# Patient Record
Sex: Female | Born: 1950
Health system: Southern US, Community
[De-identification: ages and names within clinical notes are randomized; demographics above are authoritative.]

## PROBLEM LIST (undated history)

## (undated) DIAGNOSIS — J449 Chronic obstructive pulmonary disease, unspecified: Secondary | ICD-10-CM

## (undated) DIAGNOSIS — G4733 Obstructive sleep apnea (adult) (pediatric): Secondary | ICD-10-CM

## (undated) DIAGNOSIS — E669 Obesity, unspecified: Secondary | ICD-10-CM

## (undated) DIAGNOSIS — F32A Depression, unspecified: Secondary | ICD-10-CM

## (undated) DIAGNOSIS — M1990 Unspecified osteoarthritis, unspecified site: Secondary | ICD-10-CM

## (undated) DIAGNOSIS — G4700 Insomnia, unspecified: Secondary | ICD-10-CM

## (undated) DIAGNOSIS — F329 Major depressive disorder, single episode, unspecified: Secondary | ICD-10-CM

## (undated) DIAGNOSIS — L309 Dermatitis, unspecified: Secondary | ICD-10-CM

## (undated) DIAGNOSIS — E119 Type 2 diabetes mellitus without complications: Secondary | ICD-10-CM

## (undated) DIAGNOSIS — K219 Gastro-esophageal reflux disease without esophagitis: Secondary | ICD-10-CM

## (undated) DIAGNOSIS — M1711 Unilateral primary osteoarthritis, right knee: Secondary | ICD-10-CM

## (undated) DIAGNOSIS — F419 Anxiety disorder, unspecified: Secondary | ICD-10-CM

## (undated) DIAGNOSIS — R0602 Shortness of breath: Secondary | ICD-10-CM

## (undated) DIAGNOSIS — I10 Essential (primary) hypertension: Secondary | ICD-10-CM

## (undated) DIAGNOSIS — G4730 Sleep apnea, unspecified: Secondary | ICD-10-CM

## (undated) DIAGNOSIS — G629 Polyneuropathy, unspecified: Secondary | ICD-10-CM

## (undated) DIAGNOSIS — J45909 Unspecified asthma, uncomplicated: Secondary | ICD-10-CM

## (undated) DIAGNOSIS — M549 Dorsalgia, unspecified: Secondary | ICD-10-CM

## (undated) DIAGNOSIS — I5032 Chronic diastolic (congestive) heart failure: Secondary | ICD-10-CM

## (undated) DIAGNOSIS — Z8709 Personal history of other diseases of the respiratory system: Secondary | ICD-10-CM

## (undated) DIAGNOSIS — I350 Nonrheumatic aortic (valve) stenosis: Secondary | ICD-10-CM

## (undated) DIAGNOSIS — J302 Other seasonal allergic rhinitis: Secondary | ICD-10-CM

## (undated) DIAGNOSIS — J189 Pneumonia, unspecified organism: Secondary | ICD-10-CM

## (undated) HISTORY — DX: Type 2 diabetes mellitus without complications: E11.9

## (undated) HISTORY — DX: Sleep apnea, unspecified: G47.30

## (undated) HISTORY — DX: Chronic diastolic (congestive) heart failure: I50.32

## (undated) HISTORY — DX: Other seasonal allergic rhinitis: J30.2

## (undated) HISTORY — PX: COLONOSCOPY: SHX174

## (undated) HISTORY — DX: Essential (primary) hypertension: I10

## (undated) HISTORY — PX: BREAST SURGERY: SHX581

## (undated) HISTORY — PX: POLYPECTOMY: SHX149

## (undated) HISTORY — DX: Obstructive sleep apnea (adult) (pediatric): G47.33

## (undated) HISTORY — PX: JOINT REPLACEMENT: SHX530

## (undated) HISTORY — DX: Unilateral primary osteoarthritis, right knee: M17.11

## (undated) HISTORY — DX: Nonrheumatic aortic (valve) stenosis: I35.0

## (undated) HISTORY — DX: Unspecified osteoarthritis, unspecified site: M19.90

## (undated) HISTORY — DX: Shortness of breath: R06.02

## (undated) HISTORY — DX: Chronic obstructive pulmonary disease, unspecified: J44.9

## (undated) HISTORY — PX: KNEE ARTHROSCOPY: SUR90

## (undated) HISTORY — DX: Unspecified asthma, uncomplicated: J45.909

## (undated) HISTORY — DX: Dorsalgia, unspecified: M54.9

## (undated) HISTORY — DX: Obesity, unspecified: E66.9

---

## 2014-01-20 ENCOUNTER — Encounter: Payer: Self-pay | Admitting: Pulmonary Disease

## 2014-01-20 ENCOUNTER — Encounter (INDEPENDENT_AMBULATORY_CARE_PROVIDER_SITE_OTHER): Payer: Self-pay

## 2014-01-20 ENCOUNTER — Ambulatory Visit (INDEPENDENT_AMBULATORY_CARE_PROVIDER_SITE_OTHER)
Admission: RE | Admit: 2014-01-20 | Discharge: 2014-01-20 | Disposition: A | Discharge status: Home / Self Care | Payer: Medicare Other | Source: Ambulatory Visit | Attending: Pulmonary Disease | Admitting: Pulmonary Disease

## 2014-01-20 ENCOUNTER — Ambulatory Visit (INDEPENDENT_AMBULATORY_CARE_PROVIDER_SITE_OTHER): Payer: Medicare Other | Admitting: Pulmonary Disease

## 2014-01-20 VITALS — BP 122/68 | HR 62 | Ht 67.0 in | Wt 328.0 lb

## 2014-01-20 DIAGNOSIS — Z0000 Encounter for general adult medical examination without abnormal findings: Secondary | ICD-10-CM

## 2014-01-20 DIAGNOSIS — R0600 Dyspnea, unspecified: Secondary | ICD-10-CM

## 2014-01-20 DIAGNOSIS — R0683 Snoring: Secondary | ICD-10-CM

## 2014-01-20 MED ORDER — ALBUTEROL SULFATE HFA 108 (90 BASE) MCG/ACT IN AERS: 2.0000 | INHALATION_SPRAY | Freq: Four times a day (QID) | RESPIRATORY_TRACT | Status: DC | PRN

## 2014-01-20 NOTE — Patient Instructions (Addendum)
Symbicort two puffs twice per day and rinse mouth after each use Proair two puffs up to four times per day as needed Singulair 10 mg pill nightly Will schedule home sleep study and breathing test (PFT) Chest xray today Will arrange for referral to primary care for health maintenance Follow up in 6 weeks

## 2014-01-20 NOTE — Progress Notes (Deleted)
   Subjective:    Patient ID: [name], female    DOB: Jun 10, 1950, 63 y.o.   MRN: [id number]  HPI    Review of Systems  Constitutional: Negative for fever and unexpected weight change.  HENT: Positive for congestion, dental problem and sneezing. Negative for ear pain, nosebleeds, postnasal drip, rhinorrhea, sinus pressure, sore throat and trouble swallowing.   Eyes: Negative for redness and itching.  Respiratory: Positive for shortness of breath. Negative for cough, chest tightness and wheezing.   Cardiovascular: Negative for palpitations and leg swelling.  Gastrointestinal: Negative for nausea and vomiting.       Acid Heartburn // Indigestion  Genitourinary: Negative for dysuria.  Musculoskeletal: Negative for joint swelling.  Skin: Negative for rash.  Neurological: Negative for headaches.  Hematological: Does not bruise/bleed easily.  Psychiatric/Behavioral: Negative for dysphoric mood. The patient is nervous/anxious.        Objective:   Physical Exam        Assessment & Plan:

## 2014-01-20 NOTE — Progress Notes (Signed)
Chief Complaint  Patient presents with  . PULMONARY CONSULT    Self Referral- SOB w/exertion.     History of Present Illness: Stacey Page is a 63 y.o. female former smoker for evaluation of dyspnea.  She has hx of asthma.  She was being followed by PCP in [place].  She moved to [facility] in July 2015.  She has noticed trouble with her breathing for years.  She feels short of breath all the time.  She gets wheeze in her chest.  She denies cough, or sputum.  She has hx of allergies. She has been using nasal steroid and this helps.  She still has frequent problems with her sinuses.  She does not recall having allergy testing.  She has pneumonia as a baby, and again in 2012.  She did not need supplemental oxygen after pneumonia episode in 2012.  There is no hx of exposure to TB.  She has been on disability since 1998 >> due to dermatitis and mental health issues.  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  She had pet cats/dogs in [place] >> she thinks she was allergic to the pets.  She denies hx of aspirin allergy.  She does not recall having chest xray or PFT recently.  She is not aware of having heart disease or circulation problems.  She started smoking at age 65, smoked 2 packs in a week, and quit in 1982.  Her daughters and husband smoked much more.  She also has trouble sleeping.  She will snore, and wake up with a choking sensation.  She has been told she stops breathing while asleep.  She feels tired during the day, and gets dry mouth at night.  Her Epworth score is 13 out of 24.  She does not recall every having a sleep study before.   Tests:   Stacey Page  has a past medical history of HTN (hypertension); Asthma; Diabetes; Seasonal allergies; and OA (osteoarthritis).  Stacey Page  has past surgical history that includes Cesarean section (x2) and Polypectomy.  Prior to Admission medications   Not on File    No Known Allergies  Her family  history includes COPD in her father; Cancer in her mother.  She  reports that she quit smoking about 33 years ago. Her smoking use included Cigarettes. She has a 3.75 pack-year smoking history. She does not have any smokeless tobacco history on file. She reports that she drinks alcohol. She reports that she uses illicit drugs (Marijuana).  Review of Systems  Constitutional: Negative for fever and unexpected weight change.  HENT: Positive for congestion, dental problem and sneezing. Negative for ear pain, nosebleeds, postnasal drip, rhinorrhea, sinus pressure, sore throat and trouble swallowing.   Eyes: Negative for redness and itching.  Respiratory: Positive for shortness of breath. Negative for cough, chest tightness and wheezing.   Cardiovascular: Negative for palpitations and leg swelling.  Gastrointestinal: Negative for nausea and vomiting.       Acid Heartburn // Indigestion  Genitourinary: Negative for dysuria.  Musculoskeletal: Negative for joint swelling.  Skin: Negative for rash.  Neurological: Negative for headaches.  Hematological: Does not bruise/bleed easily.  Psychiatric/Behavioral: Negative for dysphoric mood. The patient is nervous/anxious.    Physical Exam:  General - No distress ENT - No sinus tenderness, no oral exudate, no LAN, no thyromegaly, TM clear, pupils equal/reactive Cardiac - s1s2 regular, no murmur, pulses symmetric Chest - No wheeze/rales/dullness, good air entry, normal respiratory excursion Back -  No focal tenderness Abd - Soft, non-tender, no organomegaly, + bowel sounds Ext - No edema Neuro - Normal strength, cranial nerves intact Skin - No rashes Psych - Normal mood, and behavior   Discussion:  She likely has multifactorial dyspnea related to probable obstructive lung disease, obesity with deconditioning, possible cardiac disease and possible peripheral artery disease.  Assessment/Plan:  Possible obstructive lung disease with prior hx of  smoking/second hand tobacco exposure, and reported hx of asthma. Plan: - continue symbicort, singulair, and prn albuterol - will arrange for PFT's to further assess  Probable obstructive sleep apnea. We discussed how sleep apnea can affect various health problems including risks for hypertension, cardiovascular disease, and diabetes.  We also discussed how sleep disruption can increase risks for accident, such as while driving.  Weight loss as a means of improving sleep apnea was also reviewed.  Additional treatment options discussed were CPAP therapy, oral appliance, and surgical intervention. Plan: - will arrange for home sleep study to further assess, pending insurance approval  Dyspnea. Plan: - will further assess with chest xray and PFT - she might need further evaluation by cardiology and assessment for PAD  Health care maintenance. Plan: - will arrange for referral to primary care    Chesley Mires, MD [facility]/Critical Care/Sleep Pager:  [phone]

## 2014-01-23 ENCOUNTER — Telehealth: Payer: Self-pay | Admitting: Pulmonary Disease

## 2014-01-23 DIAGNOSIS — R0600 Dyspnea, unspecified: Secondary | ICD-10-CM

## 2014-01-23 DIAGNOSIS — I517 Cardiomegaly: Secondary | ICD-10-CM

## 2014-01-23 NOTE — Telephone Encounter (Signed)
Dg Chest 2 View  01/20/2014   CLINICAL DATA:  Two-month history of shortness of breath; history of asthma and diabetes and remote history of tobacco use  EXAM: CHEST  2 VIEW  COMPARISON:  None.  FINDINGS: The lungs are adequately inflated. There is no focal infiltrate. The cardiac silhouette is mildly enlarged. The central pulmonary vascularity is prominent. The mediastinum is normal in width. There is no pleural effusion or pneumothorax. There is degenerative disc space narrowing at multiple thoracic levels.  IMPRESSION: Prominence of the cardiac silhouette and pulmonary vascularity suggests low-grade CHF. There is no alveolar pneumonia.   Electronically Signed   By: David  Martinique   On: 01/20/2014 14:27    Results discussed with pt.  Will arrange for echocardiogram to further assess.

## 2014-01-28 ENCOUNTER — Ambulatory Visit (HOSPITAL_COMMUNITY): Payer: Medicare Other | Attending: Cardiology

## 2014-01-28 DIAGNOSIS — Z87891 Personal history of nicotine dependence: Secondary | ICD-10-CM | POA: Diagnosis not present

## 2014-01-28 DIAGNOSIS — R0600 Dyspnea, unspecified: Secondary | ICD-10-CM

## 2014-01-28 DIAGNOSIS — I517 Cardiomegaly: Secondary | ICD-10-CM

## 2014-01-28 NOTE — Progress Notes (Signed)
2D Echo completed. 01/28/2014 

## 2014-02-05 ENCOUNTER — Telehealth: Payer: Self-pay | Admitting: Pulmonary Disease

## 2014-02-05 NOTE — Telephone Encounter (Signed)
Echo 01/28/14 >> severe LVH, EF 65 to [id number], grade 2 diastolic dysfx, mild AS, mild/mod LA.  Results explained to the patient.

## 2014-02-07 DIAGNOSIS — A310 Pulmonary mycobacterial infection: Secondary | ICD-10-CM

## 2014-02-07 HISTORY — DX: Pulmonary mycobacterial infection: A31.0

## 2014-02-12 DIAGNOSIS — G4730 Sleep apnea, unspecified: Secondary | ICD-10-CM

## 2014-02-19 ENCOUNTER — Telehealth: Payer: Self-pay | Admitting: Pulmonary Disease

## 2014-02-19 DIAGNOSIS — I5032 Chronic diastolic (congestive) heart failure: Secondary | ICD-10-CM

## 2014-02-19 DIAGNOSIS — I10 Essential (primary) hypertension: Secondary | ICD-10-CM

## 2014-02-19 DIAGNOSIS — G4733 Obstructive sleep apnea (adult) (pediatric): Secondary | ICD-10-CM

## 2014-02-19 DIAGNOSIS — E118 Type 2 diabetes mellitus with unspecified complications: Secondary | ICD-10-CM

## 2014-02-19 DIAGNOSIS — R0600 Dyspnea, unspecified: Secondary | ICD-10-CM

## 2014-02-19 NOTE — Telephone Encounter (Signed)
Home sleep test was order 01/20/14. Did she has this done or study done else where? LMTCB x1

## 2014-02-19 NOTE — Telephone Encounter (Signed)
Pt is requesting results of the sleep study. VS please advise. thanks

## 2014-02-19 NOTE — Telephone Encounter (Signed)
Can you check where this was done at.  I do not see any sleep study results in our system.

## 2014-02-24 NOTE — Telephone Encounter (Signed)
Pt states she had HST done about 2 weeks ago; Alida was the [facility] to give her the machine. Per Alida results have been printed and are on VS's desk to review and advise. Will forward to VS and Ashtyn.

## 2014-02-25 ENCOUNTER — Encounter: Payer: Self-pay | Admitting: Pulmonary Disease

## 2014-02-25 DIAGNOSIS — E1159 Type 2 diabetes mellitus with other circulatory complications: Secondary | ICD-10-CM | POA: Insufficient documentation

## 2014-02-25 DIAGNOSIS — R0600 Dyspnea, unspecified: Secondary | ICD-10-CM | POA: Insufficient documentation

## 2014-02-25 DIAGNOSIS — I10 Essential (primary) hypertension: Secondary | ICD-10-CM | POA: Insufficient documentation

## 2014-02-25 DIAGNOSIS — R0609 Other forms of dyspnea: Secondary | ICD-10-CM | POA: Insufficient documentation

## 2014-02-25 DIAGNOSIS — I5032 Chronic diastolic (congestive) heart failure: Secondary | ICD-10-CM

## 2014-02-25 DIAGNOSIS — G4730 Sleep apnea, unspecified: Secondary | ICD-10-CM

## 2014-02-25 DIAGNOSIS — E119 Type 2 diabetes mellitus without complications: Secondary | ICD-10-CM | POA: Insufficient documentation

## 2014-02-25 DIAGNOSIS — I110 Hypertensive heart disease with heart failure: Secondary | ICD-10-CM | POA: Insufficient documentation

## 2014-02-25 DIAGNOSIS — G4733 Obstructive sleep apnea (adult) (pediatric): Secondary | ICD-10-CM

## 2014-02-25 HISTORY — DX: Obstructive sleep apnea (adult) (pediatric): G47.33

## 2014-02-25 NOTE — Telephone Encounter (Signed)
HST 02/12/14 >> AHI 59.3, SaO2 low 67%  Will have my nurse inform pt that sleep study shows severe sleep apnea.  She will need to start CPAP therapy.  Please arrange for auto CPAP set up with pressure range 5 to 15 cm H2O with heated humidity and mask of choice.  Will then review test results in more detail at her ROV on 03/12/14.

## 2014-02-26 NOTE — Telephone Encounter (Signed)
Attempted to call pt. Line was busy. Will need to try back later.

## 2014-02-26 NOTE — Telephone Encounter (Signed)
LMTCB x 1 Order placed for CPAP start.  Pt still needs results.

## 2014-02-27 ENCOUNTER — Encounter: Payer: Self-pay | Admitting: Pulmonary Disease

## 2014-02-27 NOTE — Telephone Encounter (Signed)
Spoke with pt and advised of sleep study results per Dr Halford Chessman and informed her that she will be receiving a call from DME within the next week or so.  Pt verbalized understanding and nothing further is needed.

## 2014-03-12 ENCOUNTER — Ambulatory Visit (INDEPENDENT_AMBULATORY_CARE_PROVIDER_SITE_OTHER): Payer: PPO | Admitting: Pulmonary Disease

## 2014-03-12 ENCOUNTER — Other Ambulatory Visit (INDEPENDENT_AMBULATORY_CARE_PROVIDER_SITE_OTHER): Payer: PPO

## 2014-03-12 ENCOUNTER — Encounter: Payer: Self-pay | Admitting: Pulmonary Disease

## 2014-03-12 VITALS — BP 114/68 | HR 61 | Temp 97.0°F | Ht 67.0 in | Wt 343.2 lb

## 2014-03-12 DIAGNOSIS — R0600 Dyspnea, unspecified: Secondary | ICD-10-CM

## 2014-03-12 DIAGNOSIS — R942 Abnormal results of pulmonary function studies: Secondary | ICD-10-CM

## 2014-03-12 DIAGNOSIS — I5032 Chronic diastolic (congestive) heart failure: Secondary | ICD-10-CM

## 2014-03-12 DIAGNOSIS — J4530 Mild persistent asthma, uncomplicated: Secondary | ICD-10-CM

## 2014-03-12 DIAGNOSIS — J984 Other disorders of lung: Secondary | ICD-10-CM

## 2014-03-12 DIAGNOSIS — G4733 Obstructive sleep apnea (adult) (pediatric): Secondary | ICD-10-CM

## 2014-03-12 LAB — BASIC METABOLIC PANEL
BUN: 31 mg/dL — AB (ref 6–23)
CALCIUM: 9.3 mg/dL (ref 8.4–10.5)
CO2: 29 mEq/L (ref 19–32)
Chloride: 106 mEq/L (ref 96–112)
Creatinine, Ser: 1.79 mg/dL — ABNORMAL HIGH (ref 0.40–1.20)
GFR: 36.87 mL/min — AB (ref >60.00)
GLUCOSE: 99 mg/dL (ref 70–99)
POTASSIUM: 4.8 meq/L (ref 3.5–5.1)
Sodium: 138 mEq/L (ref 135–145)

## 2014-03-12 LAB — PULMONARY FUNCTION TEST
DL/VA % PRED: 72 %
DL/VA: 3.74 ml/min/mmHg/L
DLCO UNC % PRED: 52 %
DLCO unc: 14.84 ml/min/mmHg
FEF 25-75 POST: 2.6 L/s
FEF 25-75 Pre: 2.63 L/sec
FEF2575-%Change-Post: -1 %
FEF2575-%PRED-PRE: 119 %
FEF2575-%Pred-Post: 117 %
FEV1-%CHANGE-POST: 0 %
FEV1-%Pred-Post: 88 %
FEV1-%Pred-Pre: 88 %
FEV1-POST: 2.05 L
FEV1-Pre: 2.05 L
FEV1FVC-%Change-Post: 0 %
FEV1FVC-%PRED-PRE: 109 %
FEV6-%Change-Post: 0 %
FEV6-%Pred-Post: 82 %
FEV6-%Pred-Pre: 82 %
FEV6-Post: 2.35 L
FEV6-Pre: 2.37 L
FEV6FVC-%PRED-PRE: 103 %
FEV6FVC-%Pred-Post: 103 %
FVC-%Change-Post: 0 %
FVC-%Pred-Post: 79 %
FVC-%Pred-Pre: 80 %
FVC-PRE: 2.37 L
FVC-Post: 2.35 L
Post FEV1/FVC ratio: 87 %
Post FEV6/FVC ratio: 100 %
Pre FEV1/FVC ratio: 87 %
Pre FEV6/FVC Ratio: 100 %
RV % PRED: 72 %
RV: 1.58 L
TLC % pred: 75 %
TLC: 4.14 L

## 2014-03-12 NOTE — Progress Notes (Signed)
Chief Complaint  Patient presents with  . Follow-up    Review PFT. Pt reports some improvement with breathing since last OV. Reports good tolerance of CPAP     History of Present Illness: Stacey Page is a 64 y.o. female former smoker with dyspnea from asthma, diastolic heart failure, obesity with deconditioning.  She also has sleep apnea.  Since her last visit she had chest xray, echo, home sleep study.  She had PFT today.  CXR showed cardiomegaly and interstitial edema pattern.  Home sleep study showed severe sleep apnea.  Echo showed grade 2 diastolic dysfunction.  PFT today showed mild restriction and moderate diffusion defect.  She has been trying to walk more.  She still gets winded.  She denies chest pain or palpitations.  She has occasional cough >> she does better with her asthma in cool weather.  She feels that symbicort, singulair help.  She was started on CPAP last week.  She feels this is really helping with her sleep.  She has evaluation scheduled with primary care later this month.  TESTS: Echo 01/28/14 >> severe LVH, EF 65 to [id number], grade 2 diastolic dysfx, mild AS, mild/mod LA. HST 02/12/14 >> AHI 59.3, SaO2 low 67% Auto CPAP 03/07/14 to 03/09/14 >> used on 3 of 3 nights with average 8 hrs and 8 min.  Average AHI is 3.3 with median CPAP 12 cm H2O and 95 th percentile CPAP 14 cm H20. PFT 03/12/14 >> FEV1 2.05 (88%), FEV1% 87, TLC 4.14 (75%), DLCO 52%, no BD   Past medical hx >> Diastolic CHF, HTN, DM type II  Past surgical hx, Medications, Allergies, Family hx, Social hx all reviewed.   Physical Exam: Blood pressure 114/68, pulse 61, temperature 97 F (36.1 C), temperature source Oral, height 5\' 7"  (1.702 m), weight 343 lb 3.2 oz (155.674 kg), SpO2 100 %. Body mass index is 53.74 kg/(m^2).  General - No distress ENT - No sinus tenderness, no oral exudate, no LAN Cardiac - s1s2 regular, no murmur Chest - No wheeze/rales/dullness Back - No focal tenderness Abd -  Soft, non-tender Ext - No edema Neuro - Normal strength Skin - No rashes Psych - normal mood, and behavior   Assessment/Plan:  Mild, persistent asthma. Plan: - continue symbicort, singulair, and prn proair  Obstructive sleep apnea. I have reviewed the recent sleep study results with the patient.  We discussed how sleep apnea can affect various health problems including risks for hypertension, cardiovascular disease, and diabetes.  We also discussed how sleep disruption can increase risks for accident, such as while driving.  Weight loss as a means of improving sleep apnea was also reviewed.  Additional treatment options discussed were CPAP therapy, oral appliance, and surgical intervention.  She is compliant with CPAP and reports benefit from therapy. Plan: - continue auto CPAP  Diastolic heart failure. Plan: - continue tx for HTN and OSA - defer further cardiac assessment to primary care  Mild restriction and moderate diffusion defect on PFT. Plan: - will arrange for CT chest with contrast to assess for interstitial lung disease  Obesity with deconditioning. Plan: - continue with diet, exercise, and weight reduction   Chesley Mires, MD [place] [facility]/Critical Care/Sleep Pager:  [phone]

## 2014-03-12 NOTE — Addendum Note (Signed)
Addended by: Virl Cagey on: 03/12/2014 10:53 AM   Modules accepted: Orders

## 2014-03-12 NOTE — Progress Notes (Signed)
PFT done today. 

## 2014-03-12 NOTE — Patient Instructions (Signed)
Will arrange for CT chest and call with results Follow up in 3 months

## 2014-03-14 ENCOUNTER — Telehealth: Payer: Self-pay | Admitting: Pulmonary Disease

## 2014-03-14 NOTE — Telephone Encounter (Signed)
Spoke with Stacy at CT, states creatinine was 1.79, wants to know if VS wants to switch ct on Monday to without contrast of if he wants to continue with a limited dose of contrast.    Sending to doc of day as VS is not available today.    Dr Melvyn Novas please advise.  Thank you!

## 2014-03-14 NOTE — Telephone Encounter (Signed)
Spoke with stacy at CT to change order to hrct wo contrast.    Forwarding to VS as fyi.  Nothing further needed.

## 2014-03-14 NOTE — Telephone Encounter (Signed)
Change to hrct s contrast then

## 2014-03-17 ENCOUNTER — Inpatient Hospital Stay: Admission: RE | Admit: 2014-03-17 | Payer: PPO | Source: Ambulatory Visit

## 2014-03-20 ENCOUNTER — Ambulatory Visit (INDEPENDENT_AMBULATORY_CARE_PROVIDER_SITE_OTHER)
Admission: RE | Admit: 2014-03-20 | Discharge: 2014-03-20 | Disposition: A | Discharge status: Home / Self Care | Payer: PPO | Source: Ambulatory Visit | Attending: Pulmonary Disease | Admitting: Pulmonary Disease

## 2014-03-20 DIAGNOSIS — R942 Abnormal results of pulmonary function studies: Secondary | ICD-10-CM

## 2014-03-20 DIAGNOSIS — J984 Other disorders of lung: Secondary | ICD-10-CM

## 2014-03-20 DIAGNOSIS — R0600 Dyspnea, unspecified: Secondary | ICD-10-CM

## 2014-03-26 ENCOUNTER — Encounter: Payer: Self-pay | Admitting: Pulmonary Disease

## 2014-03-26 ENCOUNTER — Telehealth: Payer: Self-pay | Admitting: Pulmonary Disease

## 2014-03-26 DIAGNOSIS — J849 Interstitial pulmonary disease, unspecified: Secondary | ICD-10-CM | POA: Insufficient documentation

## 2014-03-26 NOTE — Telephone Encounter (Signed)
CT chest 03/20/14 >> borderline LAN, several scattered nodules up to 7 mm, b/l patchy GGO, air-trapping on inspiratory/expiratory images   Discussed results with pt over phone.  Will order lab work with serology.  Depending on results, will then decide if she needs lung tissue sampling with bronchoscopy vs. VATS, or if she could have trial of steroids first.

## 2014-03-27 ENCOUNTER — Other Ambulatory Visit (INDEPENDENT_AMBULATORY_CARE_PROVIDER_SITE_OTHER): Payer: PPO

## 2014-03-27 DIAGNOSIS — J849 Interstitial pulmonary disease, unspecified: Secondary | ICD-10-CM

## 2014-03-27 LAB — CBC WITH DIFFERENTIAL/PLATELET
Basophils Absolute: 0.1 10*3/uL (ref 0.0–0.1)
Basophils Relative: 1.5 % (ref 0.0–3.0)
EOS ABS: 0.1 10*3/uL (ref 0.0–0.7)
Eosinophils Relative: 3.1 % (ref 0.0–5.0)
HEMATOCRIT: 36 % (ref 36.0–46.0)
Hemoglobin: 12 g/dL (ref 12.0–15.0)
LYMPHS PCT: 29.1 % (ref 12.0–46.0)
Lymphs Abs: 1.3 10*3/uL (ref 0.7–4.0)
MCHC: 33.2 g/dL (ref 30.0–36.0)
MCV: 87.1 fl (ref 78.0–100.0)
MONO ABS: 0.3 10*3/uL (ref 0.1–1.0)
Monocytes Relative: 7.2 % (ref 3.0–12.0)
NEUTROS ABS: 2.6 10*3/uL (ref 1.4–7.7)
Neutrophils Relative %: 59.1 % (ref 43.0–77.0)
Platelets: 218 10*3/uL (ref 150.0–400.0)
RBC: 4.13 Mil/uL (ref 3.87–5.11)
RDW: 15.3 % (ref 11.5–15.5)
WBC: 4.4 10*3/uL (ref 4.0–10.5)

## 2014-03-27 LAB — COMPREHENSIVE METABOLIC PANEL
ALBUMIN: 3.9 g/dL (ref 3.5–5.2)
ALK PHOS: 65 U/L (ref 39–117)
ALT: 14 U/L (ref 0–35)
AST: 15 U/L (ref 0–37)
BUN: 26 mg/dL — AB (ref 6–23)
CO2: 27 mEq/L (ref 19–32)
Calcium: 9.3 mg/dL (ref 8.4–10.5)
Chloride: 105 mEq/L (ref 96–112)
Creatinine, Ser: 1.6 mg/dL — ABNORMAL HIGH (ref 0.40–1.20)
GFR: 41.96 mL/min — ABNORMAL LOW (ref >60.00)
GLUCOSE: 99 mg/dL (ref 70–99)
Potassium: 4.1 mEq/L (ref 3.5–5.1)
Sodium: 139 mEq/L (ref 135–145)
Total Bilirubin: 0.4 mg/dL (ref 0.2–1.2)
Total Protein: 7.7 g/dL (ref 6.0–8.3)

## 2014-03-27 LAB — CK: Total CK: 209 U/L — ABNORMAL HIGH (ref 7–177)

## 2014-03-27 LAB — LACTATE DEHYDROGENASE: LDH: 160 U/L (ref 94–250)

## 2014-03-27 LAB — SEDIMENTATION RATE: Sed Rate: 60 mm/hr — ABNORMAL HIGH (ref 0–22)

## 2014-03-28 LAB — ANCA SCREEN W REFLEX TITER
ATYPICAL P-ANCA SCREEN: NEGATIVE
P-ANCA SCREEN: POSITIVE — AB
c-ANCA Screen: NEGATIVE

## 2014-03-28 LAB — ANTI-SCLERODERMA ANTIBODY: Scleroderma (Scl-70) (ENA) Antibody, IgG: <1

## 2014-03-28 LAB — HIV ANTIBODY (ROUTINE TESTING W REFLEX): HIV 1&2 Ab, 4th Generation: NONREACTIVE

## 2014-03-28 LAB — SJOGREN'S SYNDROME ANTIBODS(SSA + SSB)
SSA (RO) (ENA) ANTIBODY, IGG: NEGATIVE
SSB (La) (ENA) Antibody, IgG: <1

## 2014-03-28 LAB — JO-1 ANTIBODY-IGG: Jo-1 Antibody, IgG: <1

## 2014-03-28 LAB — RHEUMATOID FACTOR: Rhuematoid fact SerPl-aCnc: <10 IU/mL (ref <=14)

## 2014-03-28 LAB — ANCA TITERS: P-ANCA: 1:160 {titer} — ABNORMAL HIGH

## 2014-03-31 LAB — ANA W/REFLEX IF POSITIVE: Anti Nuclear Antibody(ANA): NEGATIVE

## 2014-04-01 ENCOUNTER — Encounter: Payer: Self-pay | Admitting: Internal Medicine

## 2014-04-01 ENCOUNTER — Ambulatory Visit (INDEPENDENT_AMBULATORY_CARE_PROVIDER_SITE_OTHER): Payer: PPO | Admitting: Internal Medicine

## 2014-04-01 VITALS — BP 148/82 | HR 67 | Temp 98.1°F | Resp 20 | Ht 68.0 in | Wt 331.0 lb

## 2014-04-01 DIAGNOSIS — E118 Type 2 diabetes mellitus with unspecified complications: Secondary | ICD-10-CM

## 2014-04-01 DIAGNOSIS — Z0000 Encounter for general adult medical examination without abnormal findings: Secondary | ICD-10-CM

## 2014-04-01 DIAGNOSIS — I5032 Chronic diastolic (congestive) heart failure: Secondary | ICD-10-CM

## 2014-04-01 DIAGNOSIS — I10 Essential (primary) hypertension: Secondary | ICD-10-CM

## 2014-04-01 DIAGNOSIS — J849 Interstitial pulmonary disease, unspecified: Secondary | ICD-10-CM

## 2014-04-01 LAB — HYPERSENSITIVITY PNUEMONITIS PROFILE

## 2014-04-01 MED ORDER — LISINOPRIL 40 MG PO TABS: 40.0000 mg | ORAL_TABLET | Freq: Every day | ORAL | Status: DC

## 2014-04-01 MED ORDER — SITAGLIPTIN PHOSPHATE 100 MG PO TABS: 100.0000 mg | ORAL_TABLET | Freq: Every day | ORAL | Status: DC

## 2014-04-01 MED ORDER — LABETALOL HCL 300 MG PO TABS: 300.0000 mg | ORAL_TABLET | Freq: Two times a day (BID) | ORAL | Status: DC

## 2014-04-01 MED ORDER — FUROSEMIDE 40 MG PO TABS: 40.0000 mg | ORAL_TABLET | Freq: Every day | ORAL | Status: DC

## 2014-04-01 MED ORDER — POTASSIUM CITRATE ER 10 MEQ (1080 MG) PO TBCR: 10.0000 meq | EXTENDED_RELEASE_TABLET | Freq: Every day | ORAL | Status: DC

## 2014-04-01 NOTE — Progress Notes (Signed)
Pre visit review using our clinic review tool, if applicable. No additional management support is needed unless otherwise documented below in the visit note. 

## 2014-04-01 NOTE — Patient Instructions (Signed)
We will work on getting you a colon cancer screening to your house. It is called cologuard and has a 96% chance of finding cancer or high grade polyps in the colon. It does have about a 5% false positive. If the test comes back negative we can talk about colon cancer screening in about 3 years. If it is positive we would need to do the colonoscopy as we would be pretty sure you have some kind of polyp in the colon.   Think about getting the pneumonia shot as it can help to decrease your risk of ending up in the hospital with pneumonia. It would be one shot now and another shot in 1 year.   You are also due for a mammogram and a pap smear so please think about doing that. Let us know if you are ready.   We are not checking blood work but will see you back in about 3 months and do blood work then.   Work on losing weight as this will help all of your health problems. There is a weight loss seminar which includes counseling, diet, exercise through [facility]. This is the website: https://west-[facility].com/  The website talks about weight loss surgery but they also help people who do not want to have surgery to lose weight.   Water aerobics is a good way to get exercise as well without the pain in your joints.   I will review your records and see if we think we need to do any other tests to make sure you are healthy.   Serving Sizes What we call a serving size today is larger than it was in the past. A 1950s fast-food burger contained little more than 1 oz of meat, and a soft drink was 8 oz (1 cup). Today, a "quarter pounder" burger is at least 4 times that amount, and a 32 or 64 oz drink is not uncommon. A possible guide for eating when trying to lose weight is to eat about half as much as you normally do. Some estimates of serving sizes are:  1 Dairy serving:Individual container of yogurt (8 oz) or piece of cheese the size of your thumb (1 oz).  1 Grain serving: 1 slice of  bread or  cup pasta.  1 Meat serving: The size of a deck of cards (3 oz).  1 Fruit serving: cup canned fruit or 1 medium fruit.  1 Vegetable serving:  cup of cooked or canned vegetables.  1 Fat serving:The size of 4 stacked dimes. Experts suggest spending 1 or 2 days measuring food portions you commonly eat. This will give you better practice at estimating serving sizes, and will also show whether you are eating an appropriate amount of food to meet your weight goals. If you find that you are eating more than you thought, try measuring your food for a few days so you can "reprogram" yourself to learn what makes a healthy portion for you. SUGGESTIONS FOR CONTROL  In restaurants, share entrees, or ask the waiter to put half the entre in a box or bag before you even touch it.  Order lunch-sized portions. Many restaurants serve 4 to 6 oz of meat at lunch, compared with 8 to 10 oz at dinner.  Split dessert or skip it all together. Have a piece of fruit when you get home.  At home, use smaller plates and bowls. It will look as if you are eating more.  Plate your food in the kitchen rather  than serving it "family style" at the table.  Wait 20 to 30 minutes before taking seconds. This is how long it takes your brain to recognize that you are full.  Check food labels for serving sizes. Eat 1 serving only.  Use measuring cups and spoons to see proper serving sizes.  Buy smaller packages of candy, popcorn, and snacks.  Avoid eating directly out of the bag or carton.  While eating half as much, exercise twice as much. Park further away from the mall, take the stairs instead of the escalator, and walk around your block. Losing weight is a slow, difficult process. It takes long-lasting lifestyle changes. You can make gradual changes over time so they become habits. Look to friends and family to support the healthy changes you are making. Avoid fad diets since they are often only temporary  weight loss solutions. Document Released: 10/23/2002 Document Revised: 04/18/2011 Document Reviewed: 04/23/2013 [employer] Patient Information 2015 [place], [place]. This information is not intended to replace advice given to you by your health care provider. Make sure you discuss any questions you have with your health care provider.

## 2014-04-03 ENCOUNTER — Encounter: Payer: Self-pay | Admitting: Internal Medicine

## 2014-04-03 DIAGNOSIS — Z0000 Encounter for general adult medical examination without abnormal findings: Secondary | ICD-10-CM | POA: Insufficient documentation

## 2014-04-03 DIAGNOSIS — Z6841 Body Mass Index (BMI) 40.0 and over, adult: Secondary | ICD-10-CM | POA: Insufficient documentation

## 2014-04-03 NOTE — Assessment & Plan Note (Signed)
She is on beta blocker, ACE-I and lasix. Have not seen records of the echo but will review.

## 2014-04-03 NOTE — Assessment & Plan Note (Signed)
Decline flu and pneumonia shot today. Declines shingles. Can't remember if she has had colonoscopy and not sure on last mammogram. Will need to review records and talk with her more next time about getting some of her health maintenance taken care of.

## 2014-04-03 NOTE — Assessment & Plan Note (Addendum)
On ACE-I, per her well controlled. Vascular complications noted. Check HgA1c at next visit given high burden of testing to diagnose the ILD, sugars stable on all those blood draws. Declines pneumonia and flu shot today.

## 2014-04-03 NOTE — Assessment & Plan Note (Signed)
BP controlled today, refill of lisinopril, labetalol, furosemide given today. Reviewed recent BMP.

## 2014-04-03 NOTE — Assessment & Plan Note (Signed)
Recent diagnosis per pulmonary and if she requires steroid treatment will need more aggressive treatment of her diabetes.

## 2014-04-03 NOTE — Progress Notes (Signed)
   Subjective:    Patient ID: Stacey Page, female    DOB: 10-13-1950, 64 y.o.   MRN: [id number]  HPI The patient is a 64 YO female who is coming in today for wellness. She does have several chronic conditions which were reviewed and evaluated today (see A/P for status and treatment). She is also recently been evaluated for ILD by pulmonary which is giving her SOB with exertion. No change in those symptoms recently. No other new complaints.   PMH, [facility], social history, allergies, medications, problem list reviewed and updated.   Review of Systems  Constitutional: Positive for activity change. Negative for fever, chills, appetite change, fatigue and unexpected weight change.  HENT: Negative.   Eyes: Negative.   Respiratory: Positive for cough and shortness of breath. Negative for chest tightness and wheezing.   Cardiovascular: Negative for chest pain, palpitations and leg swelling.  Gastrointestinal: Negative for nausea, abdominal pain, diarrhea, constipation and abdominal distention.  Musculoskeletal: Positive for arthralgias.  Skin: Negative.   Neurological: Negative.   Psychiatric/Behavioral: Negative.       Objective:   Physical Exam  Constitutional: She is oriented to person, place, and time. She appears well-developed and well-nourished.  Overewight  HENT:  Head: Normocephalic and atraumatic.  Eyes: EOM are normal.  Cardiovascular: Normal rate.   Pulmonary/Chest: Effort normal. No respiratory distress. She has no wheezes. She has no rales.  Abdominal: Soft. Bowel sounds are normal. She exhibits no distension. There is no tenderness. There is no rebound.  Musculoskeletal: She exhibits no edema.  Neurological: She is alert and oriented to person, place, and time. Coordination normal.  Skin: Skin is warm and dry.  Psychiatric: She has a normal mood and affect.   Filed Vitals:   04/01/14 1016  BP: 148/82  Pulse: 67  Temp: 98.1 F (36.7 C)  TempSrc: Oral  Resp: 20    Height: 5\' 8"  (1.727 m)  Weight: 331 lb (150.141 kg)  SpO2: 90%      Assessment & Plan:

## 2014-04-03 NOTE — Assessment & Plan Note (Signed)
BMI greater than 50 and we did talk briefly today about the fact that her weight is likely causing her OSA, diabetes, hypertension and could be related to her heart failure. She needs to start moving some and we discussed that at length. Also talked to her about the importance of a healthy diet especially given her possible heart failure.

## 2014-04-07 ENCOUNTER — Telehealth: Payer: Self-pay | Admitting: Pulmonary Disease

## 2014-04-07 NOTE — Telephone Encounter (Signed)
Patient says that she went into the web portal and looked at the lab results and she wants to know what happens next.  According to Dr. Unice Cobble notes, patient's lab results determine whether or not patient needs to have bronchoscopy done for Lung Biopsy.  Patient would like to know if the results indicate that she needs to move forward with a biopsy?  Please advise.

## 2014-04-09 MED ORDER — PREDNISONE 20 MG PO TABS: 40.0000 mg | ORAL_TABLET | Freq: Every day | ORAL | Status: DC

## 2014-04-09 NOTE — Telephone Encounter (Signed)
Lab tests d/w pt.    Will start her on prednisone 40 mg daily.    Will have my nurse schedule ROV in 3 weeks with me or Tammy Parrett.    If no improvement with prednisone, then she will need to have evaluation for surgical lung biopsy.  Advised her to d/w her PCP about DM management while on prednisone.

## 2014-04-10 NOTE — Telephone Encounter (Signed)
Spoke with pt and given appt with Tammy Parrett.

## 2014-04-21 ENCOUNTER — Other Ambulatory Visit: Payer: Self-pay | Admitting: Internal Medicine

## 2014-04-21 ENCOUNTER — Telehealth: Payer: Self-pay | Admitting: Internal Medicine

## 2014-04-21 DIAGNOSIS — Z0000 Encounter for general adult medical examination without abnormal findings: Secondary | ICD-10-CM

## 2014-04-21 DIAGNOSIS — Z1231 Encounter for screening mammogram for malignant neoplasm of breast: Secondary | ICD-10-CM

## 2014-04-21 NOTE — Telephone Encounter (Signed)
Patient is requesting a referral for mammogram. Her sister is getting one at [facility] radiology on [address] on 05/06/2014 at 3. She requests to be able to go on this day around this time.

## 2014-04-21 NOTE — Telephone Encounter (Signed)
Would you please put in order?

## 2014-04-21 NOTE — Telephone Encounter (Signed)
Order placed, we cannot control if the imaging center will be able to accommodate her at that time and date.

## 2014-04-21 NOTE — Telephone Encounter (Signed)
Spoke to patient and she has already set up her appointment.

## 2014-05-01 ENCOUNTER — Encounter: Payer: Self-pay | Admitting: Adult Health

## 2014-05-01 ENCOUNTER — Ambulatory Visit (INDEPENDENT_AMBULATORY_CARE_PROVIDER_SITE_OTHER): Payer: PPO | Admitting: Adult Health

## 2014-05-01 VITALS — BP 144/80 | HR 72 | Temp 98.2°F | Ht 68.0 in | Wt 348.0 lb

## 2014-05-01 DIAGNOSIS — J849 Interstitial pulmonary disease, unspecified: Secondary | ICD-10-CM | POA: Diagnosis not present

## 2014-05-01 DIAGNOSIS — G4733 Obstructive sleep apnea (adult) (pediatric): Secondary | ICD-10-CM

## 2014-05-01 MED ORDER — PREDNISONE 20 MG PO TABS: ORAL_TABLET | ORAL | Status: DC

## 2014-05-01 NOTE — Assessment & Plan Note (Signed)
Cont on CPAP  Wt loss

## 2014-05-01 NOTE — Progress Notes (Signed)
   Subjective:    Patient ID: Stacey Page, female    DOB: November 01, 1950, 64 y.o.   MRN: [id number]  HPI Former smoker with dyspnea from asthma, diastolic heart failure, obesity with deconditioning.  She also has sleep apnea on CPAP   TESTS: Echo 01/28/14 >> severe LVH, EF 65 to [id number], grade 2 diastolic dysfx, mild AS, mild/mod LA. HST 02/12/14 >> AHI 59.3, SaO2 low 67% Auto CPAP 03/07/14 to 03/09/14 >> used on 3 of 3 nights with average 8 hrs and 8 min.  Average AHI is 3.3 with median CPAP 12 cm H2O and 95 th percentile CPAP 14 cm H20. PFT 03/12/14 >> FEV1 2.05 (88%), FEV1% 87, TLC 4.14 (75%), DLCO 52%, no BD  05/01/2014 Follow up Dyspnea /Asthma  Pt returns for follow up and to review test.  She underwent at CT chest HR on 2/15 that showed probable ILD, NSIP. Multiple scattered pulmonary nodules , largest of 6 mm .  Labs showed p ANCA positive (1:160 ) , rest of CTD /autoimmune workup neg.  She was started on Prednisone 40mg  daily  Has not noticed a change in her breathing/dyspnea since starting prednisone.  We discussed her test results. Discussed the possiblilty of lung biopsy.  She declines lung bx at this time, want to wait to until next ov to decide .  Denies sign cough. No rash, joint swelling.  No fevers, chest pain, orthopena, increased edema or hemoptysis.   Review of Systems  Constitutional:   No  weight loss, night sweats,  Fevers, chills, + fatigue, or  lassitude.  HEENT:   No headaches,  Difficulty swallowing,  Tooth/dental problems, or  Sore throat,                No sneezing, itching, ear ache, nasal congestion, post nasal drip,   CV:  No chest pain,  Orthopnea, PND, swelling in lower extremities, anasarca, dizziness, palpitations, syncope.   GI  No heartburn, indigestion, abdominal pain, nausea, vomiting, diarrhea, change in bowel habits, loss of appetite, bloody stools.   Resp:   No chest wall deformity  Skin: no rash or lesions.  GU: no dysuria, change in color of  urine, no urgency or frequency.  No flank pain, no hematuria   MS:  No joint pain or swelling.  No decreased range of motion.  No back pain.  Psych:  No change in mood or affect. No depression or anxiety.  No memory loss.          Objective:   Physical Exam GEN: A/Ox3; pleasant , NAD, morbidly obese   HEENT:  [place]/AT,  EACs-clear, TMs-wnl, NOSE-clear, THROAT-clear, no lesions, no postnasal drip or exudate noted.   NECK:  Supple w/ fair ROM; no JVD; normal carotid impulses w/o bruits; no thyromegaly or nodules palpated; no lymphadenopathy.  RESP  Decreased BS in bases , no accessory muscle use, no dullness to percussion  CARD:  RRR, no m/r/g  , no peripheral edema, pulses intact, no cyanosis or clubbing.  GI:   Soft & nt; nml bowel sounds; no organomegaly or masses detected.  Musco: Warm bil, no deformities or joint swelling noted.   Neuro: alert, no focal deficits noted.    Skin: Warm, no lesions or rashes         Assessment & Plan:

## 2014-05-01 NOTE — Patient Instructions (Signed)
Taper Prednisone 30mg  daily for 2 weeks , 20mg  daily for 2 weeks and hold at this dose until seen back in office.  Follow up Dr. Halford Chessman  In 4 weeks and As needed   Please contact office for sooner follow up if symptoms do not improve or worsen or seek emergency care

## 2014-05-01 NOTE — Assessment & Plan Note (Signed)
PFT with no sign airflow obstruction , decrease diffusing capacity  CT showed probable ILD (NSIP )  Lab panel show + p ANCA  Steroid challenge without sign perceived benefit.  Will taper steroids slowly until seen back in office  Consider Rheumatology referral . For possible vasculitis   Plan  Taper Prednisone 30mg  daily for 2 weeks , 20mg  daily for 2 weeks and hold at this dose until seen back in office.  Follow up Dr. Halford Chessman  In 4 weeks and As needed   Please contact office for sooner follow up if symptoms do not improve or worsen or seek emergency care

## 2014-05-02 NOTE — Progress Notes (Signed)
Reviewed.  She has not benefited from trial of steroids.  Agree that she needs evaluation by rheumatology.  She might also needs lung biopsy to further assess, but will get rheumatology evaluation first.

## 2014-05-05 ENCOUNTER — Telehealth: Payer: Self-pay | Admitting: Adult Health

## 2014-05-05 DIAGNOSIS — J849 Interstitial pulmonary disease, unspecified: Secondary | ICD-10-CM

## 2014-05-05 NOTE — Telephone Encounter (Signed)
Spoke with the pt  She states that she had discussed possible bronch with TP at last visit  She wants to go ahead and do this before her next visit on 06/09/14  Please advise thanks

## 2014-05-06 ENCOUNTER — Ambulatory Visit
Admission: RE | Admit: 2014-05-06 | Discharge: 2014-05-06 | Disposition: A | Discharge status: Home / Self Care | Payer: PPO | Source: Ambulatory Visit | Attending: Internal Medicine | Admitting: Internal Medicine

## 2014-05-06 DIAGNOSIS — Z1231 Encounter for screening mammogram for malignant neoplasm of breast: Secondary | ICD-10-CM

## 2014-05-08 NOTE — Telephone Encounter (Signed)
Discussed with pt over phone about current status of ILD.  No improvement with trial of prednisone.  She needs appointment with rheumatology >> already in the works.  Explained that bronchoscopy would be of limited utility at this point, and better option for biopsy is referral to thoracic surgery to assess for VATS biopsy.  I have placed ordered for referral.

## 2014-05-19 ENCOUNTER — Encounter (HOSPITAL_COMMUNITY): Payer: Self-pay

## 2014-05-19 ENCOUNTER — Emergency Department (HOSPITAL_COMMUNITY)
Admission: EM | Admit: 2014-05-19 | Discharge: 2014-05-19 | Disposition: A | Discharge status: Home / Self Care | Payer: PPO | Attending: Emergency Medicine | Admitting: Emergency Medicine

## 2014-05-19 DIAGNOSIS — M1990 Unspecified osteoarthritis, unspecified site: Secondary | ICD-10-CM | POA: Diagnosis not present

## 2014-05-19 DIAGNOSIS — R55 Syncope and collapse: Secondary | ICD-10-CM | POA: Diagnosis present

## 2014-05-19 DIAGNOSIS — E119 Type 2 diabetes mellitus without complications: Secondary | ICD-10-CM | POA: Insufficient documentation

## 2014-05-19 DIAGNOSIS — I10 Essential (primary) hypertension: Secondary | ICD-10-CM | POA: Insufficient documentation

## 2014-05-19 DIAGNOSIS — Z8669 Personal history of other diseases of the nervous system and sense organs: Secondary | ICD-10-CM | POA: Diagnosis not present

## 2014-05-19 DIAGNOSIS — Z7951 Long term (current) use of inhaled steroids: Secondary | ICD-10-CM | POA: Diagnosis not present

## 2014-05-19 DIAGNOSIS — Z79899 Other long term (current) drug therapy: Secondary | ICD-10-CM | POA: Insufficient documentation

## 2014-05-19 DIAGNOSIS — J45909 Unspecified asthma, uncomplicated: Secondary | ICD-10-CM | POA: Diagnosis not present

## 2014-05-19 DIAGNOSIS — Z87891 Personal history of nicotine dependence: Secondary | ICD-10-CM | POA: Diagnosis not present

## 2014-05-19 LAB — BASIC METABOLIC PANEL
Anion gap: 11 (ref 5–15)
BUN: 32 mg/dL — ABNORMAL HIGH (ref 6–23)
CO2: 24 mmol/L (ref 19–32)
Calcium: 9 mg/dL (ref 8.4–10.5)
Chloride: 104 mmol/L (ref 96–112)
Creatinine, Ser: 1.58 mg/dL — ABNORMAL HIGH (ref 0.50–1.10)
GFR calc Af Amer: 39 mL/min — ABNORMAL LOW (ref >90)
GFR calc non Af Amer: 34 mL/min — ABNORMAL LOW (ref >90)
Glucose, Bld: 125 mg/dL — ABNORMAL HIGH (ref 70–99)
Potassium: 3.8 mmol/L (ref 3.5–5.1)
Sodium: 139 mmol/L (ref 135–145)

## 2014-05-19 LAB — CBC
HCT: 36.5 % (ref 36.0–46.0)
Hemoglobin: 11.6 g/dL — ABNORMAL LOW (ref 12.0–15.0)
MCH: 29.4 pg (ref 26.0–34.0)
MCHC: 31.8 g/dL (ref 30.0–36.0)
MCV: 92.6 fL (ref 78.0–100.0)
Platelets: 149 10*3/uL — ABNORMAL LOW (ref 150–400)
RBC: 3.94 MIL/uL (ref 3.87–5.11)
RDW: 15.9 % — ABNORMAL HIGH (ref 11.5–15.5)
WBC: 7.7 10*3/uL (ref 4.0–10.5)

## 2014-05-19 LAB — CBG MONITORING, ED: Glucose-Capillary: 119 mg/dL — ABNORMAL HIGH (ref 70–99)

## 2014-05-19 MED ORDER — SODIUM CHLORIDE 0.9 % IV BOLUS (SEPSIS)
1000.0000 mL | Freq: Once | INTRAVENOUS | Status: AC
  Administered 2014-05-19: 1000 mL via INTRAVENOUS
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
## 2014-05-19 NOTE — ED Notes (Signed)
Pt ambulated to bathroom with stand by assistance. Pt remained steady and denies dizziness.
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
## 2014-05-19 NOTE — Discharge Instructions (Signed)
Do not take your evening dose of labetalol (normodyne) today. Resuming taking it tomorrow as prescribed.    Syncope Syncope is a medical term for fainting or passing out. This means you lose consciousness and drop to the ground. People are generally unconscious for less than 5 minutes. You may have some muscle twitches for up to 15 seconds before waking up and returning to normal. Syncope occurs more often in older adults, but it can happen to anyone. While most causes of syncope are not dangerous, syncope can be a sign of a serious medical problem. It is important to seek medical care.  CAUSES  Syncope is caused by a sudden drop in blood flow to the brain. The specific cause is often not determined. Factors that can bring on syncope include:  Taking medicines that lower blood pressure.  Sudden changes in posture, such as standing up quickly.  Taking more medicine than prescribed.  Standing in one place for too long.  Seizure disorders.  Dehydration and excessive exposure to heat.  Low blood sugar (hypoglycemia).  Straining to have a bowel movement.  Heart disease, irregular heartbeat, or other circulatory problems.  Fear, emotional distress, seeing blood, or severe pain. SYMPTOMS  Right before fainting, you may:  Feel dizzy or light-headed.  Feel nauseous.  See all white or all black in your field of vision.  Have cold, clammy skin. DIAGNOSIS  Your health care provider will ask about your symptoms, perform a physical exam, and perform an electrocardiogram (ECG) to record the electrical activity of your heart. Your health care provider may also perform other heart or blood tests to determine the cause of your syncope which may include:  Transthoracic echocardiogram (TTE). During echocardiography, sound waves are used to evaluate how blood flows through your heart.  Transesophageal echocardiogram (TEE).  Cardiac monitoring. This allows your health care provider to monitor  your heart rate and rhythm in real time.  Holter monitor. This is a portable device that records your heartbeat and can help diagnose heart arrhythmias. It allows your health care provider to track your heart activity for several days, if needed.  Stress tests by exercise or by giving medicine that makes the heart beat faster. TREATMENT  In most cases, no treatment is needed. Depending on the cause of your syncope, your health care provider may recommend changing or stopping some of your medicines. HOME CARE INSTRUCTIONS  Have someone stay with you until you feel stable.  Do not drive, use machinery, or play sports until your health care provider says it is okay.  Keep all follow-up appointments as directed by your health care provider.  Lie down right away if you start feeling like you might faint. Breathe deeply and steadily. Wait until all the symptoms have passed.  Drink enough fluids to keep your urine clear or pale yellow.  If you are taking blood pressure or heart medicine, get up slowly and take several minutes to sit and then stand. This can reduce dizziness. SEEK IMMEDIATE MEDICAL CARE IF:   You have a severe headache.  You have unusual pain in the chest, abdomen, or back.  You are bleeding from your mouth or rectum, or you have black or tarry stool.  You have an irregular or very fast heartbeat.  You have pain with breathing.  You have repeated fainting or seizure-like jerking during an episode.  You faint when sitting or lying down.  You have confusion.  You have trouble walking.  You have severe weakness.  You have vision problems. If you fainted, call your local emergency services (911 in [place] Do not drive yourself to the hospital.  MAKE SURE YOU:  Understand these instructions.  Will watch your condition.  Will get help right away if you are not doing well or get worse. Document Released: 01/24/2005 Document Revised: 01/29/2013 Document Reviewed:  03/25/2011 [facility] Patient Information 2015 [place], [place]. This information is not intended to replace advice given to you by your health care provider. Make sure you discuss any questions you have with your health care provider.

## 2014-05-20 ENCOUNTER — Other Ambulatory Visit (INDEPENDENT_AMBULATORY_CARE_PROVIDER_SITE_OTHER): Payer: PPO

## 2014-05-20 ENCOUNTER — Ambulatory Visit (INDEPENDENT_AMBULATORY_CARE_PROVIDER_SITE_OTHER): Payer: PPO | Admitting: Internal Medicine

## 2014-05-20 ENCOUNTER — Encounter: Payer: Self-pay | Admitting: Internal Medicine

## 2014-05-20 ENCOUNTER — Other Ambulatory Visit: Payer: Self-pay | Admitting: Geriatric Medicine

## 2014-05-20 VITALS — BP 106/62 | HR 73 | Temp 98.6°F | Resp 16 | Ht 68.0 in | Wt 343.0 lb

## 2014-05-20 DIAGNOSIS — E0821 Diabetes mellitus due to underlying condition with diabetic nephropathy: Secondary | ICD-10-CM | POA: Diagnosis not present

## 2014-05-20 DIAGNOSIS — I10 Essential (primary) hypertension: Secondary | ICD-10-CM

## 2014-05-20 DIAGNOSIS — E0929 Drug or chemical induced diabetes mellitus with other diabetic kidney complication: Secondary | ICD-10-CM

## 2014-05-20 LAB — LIPID PANEL
CHOL/HDL RATIO: 3
Cholesterol: 214 mg/dL — ABNORMAL HIGH (ref 0–200)
HDL: 71.9 mg/dL (ref >39.00)
LDL Cholesterol: 122 mg/dL — ABNORMAL HIGH (ref 0–99)
NonHDL: 142.1
TRIGLYCERIDES: 101 mg/dL (ref 0.0–149.0)
VLDL: 20.2 mg/dL (ref 0.0–40.0)

## 2014-05-20 LAB — HEMOGLOBIN A1C: HEMOGLOBIN A1C: 6.8 % — AB (ref 4.6–6.5)

## 2014-05-20 LAB — BASIC METABOLIC PANEL
BUN: 31 mg/dL — ABNORMAL HIGH (ref 6–23)
CALCIUM: 9.9 mg/dL (ref 8.4–10.5)
CO2: 29 meq/L (ref 19–32)
CREATININE: 1.55 mg/dL — AB (ref 0.40–1.20)
Chloride: 101 mEq/L (ref 96–112)
GFR: 43.5 mL/min — AB (ref >60.00)
Glucose, Bld: 119 mg/dL — ABNORMAL HIGH (ref 70–99)
Potassium: 4.8 mEq/L (ref 3.5–5.1)
Sodium: 136 mEq/L (ref 135–145)

## 2014-05-20 LAB — TSH: TSH: 0.35 u[IU]/mL (ref 0.35–4.50)

## 2014-05-20 LAB — T3, FREE: T3, Free: 2.6 pg/mL (ref 2.3–4.2)

## 2014-05-20 LAB — T4: T4, Total: 6 ug/dL (ref 4.5–12.0)

## 2014-05-20 MED ORDER — LISINOPRIL 40 MG PO TABS: 40.0000 mg | ORAL_TABLET | Freq: Every day | ORAL | Status: DC

## 2014-05-20 MED ORDER — LABETALOL HCL 200 MG PO TABS: 200.0000 mg | ORAL_TABLET | Freq: Two times a day (BID) | ORAL | Status: DC

## 2014-05-20 MED ORDER — SITAGLIPTIN PHOSPHATE 100 MG PO TABS: 100.0000 mg | ORAL_TABLET | Freq: Every day | ORAL | Status: DC

## 2014-05-20 MED ORDER — POTASSIUM CITRATE ER 10 MEQ (1080 MG) PO TBCR: 10.0000 meq | EXTENDED_RELEASE_TABLET | Freq: Every day | ORAL | Status: DC
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
## 2014-05-20 NOTE — Patient Instructions (Signed)
Syncope °Syncope is a medical term for fainting or passing out. This means you lose consciousness and drop to the ground. People are generally unconscious for less than 5 minutes. You may have some muscle twitches for up to 15 seconds before waking up and returning to normal. Syncope occurs more often in older adults, but it can happen to anyone. While most causes of syncope are not dangerous, syncope can be a sign of a serious medical problem. It is important to seek medical care.  °CAUSES  °Syncope is caused by a sudden drop in blood flow to the brain. The specific cause is often not determined. Factors that can bring on syncope include: °· Taking medicines that lower blood pressure. °· Sudden changes in posture, such as standing up quickly. °· Taking more medicine than prescribed. °· Standing in one place for too long. °· Seizure disorders. °· Dehydration and excessive exposure to heat. °· Low blood sugar (hypoglycemia). °· Straining to have a bowel movement. °· Heart disease, irregular heartbeat, or other circulatory problems. °· Fear, emotional distress, seeing blood, or severe pain. °SYMPTOMS  °Right before fainting, you may: °· Feel dizzy or light-headed. °· Feel nauseous. °· See all white or all black in your field of vision. °· Have cold, clammy skin. °DIAGNOSIS  °Your health care provider will ask about your symptoms, perform a physical exam, and perform an electrocardiogram (ECG) to record the electrical activity of your heart. Your health care provider may also perform other heart or blood tests to determine the cause of your syncope which may include: °· Transthoracic echocardiogram (TTE). During echocardiography, sound waves are used to evaluate how blood flows through your heart. °· Transesophageal echocardiogram (TEE). °· Cardiac monitoring. This allows your health care provider to monitor your heart rate and rhythm in real time. °· Holter monitor. This is a portable device that records your  heartbeat and can help diagnose heart arrhythmias. It allows your health care provider to track your heart activity for several days, if needed. °· Stress tests by exercise or by giving medicine that makes the heart beat faster. °TREATMENT  °In most cases, no treatment is needed. Depending on the cause of your syncope, your health care provider may recommend changing or stopping some of your medicines. °HOME CARE INSTRUCTIONS °· Have someone stay with you until you feel stable. °· Do not drive, use machinery, or play sports until your health care provider says it is okay. °· Keep all follow-up appointments as directed by your health care provider. °· Lie down right away if you start feeling like you might faint. Breathe deeply and steadily. Wait until all the symptoms have passed. °· Drink enough fluids to keep your urine clear or pale yellow. °· If you are taking blood pressure or heart medicine, get up slowly and take several minutes to sit and then stand. This can reduce dizziness. °SEEK IMMEDIATE MEDICAL CARE IF:  °· You have a severe headache. °· You have unusual pain in the chest, abdomen, or back. °· You are bleeding from your mouth or rectum, or you have black or tarry stool. °· You have an irregular or very fast heartbeat. °· You have pain with breathing. °· You have repeated fainting or seizure-like jerking during an episode. °· You faint when sitting or lying down. °· You have confusion. °· You have trouble walking. °· You have severe weakness. °· You have vision problems. °If you fainted, call your local emergency services (911 in [place] Do not drive   yourself to the hospital.  °MAKE SURE YOU: °· Understand these instructions. °· Will watch your condition. °· Will get help right away if you are not doing well or get worse. °Document Released: 01/24/2005 Document Revised: 01/29/2013 Document Reviewed: 03/25/2011 °[employer]® Patient Information ©2015 [employer]. This information is not intended to replace  advice given to you by your health care provider. Make sure you discuss any questions you have with your health care provider. ° °

## 2014-05-20 NOTE — Assessment & Plan Note (Addendum)
I think her syncopal episode was related to low BP and low heart rate Will decrease the normodyne from 300 mg BID to 200 mg BID She will cont the other meds for HTN Will recheck her lytes and renal function today Will recheck her BP and heart rate in 2-3 weeks

## 2014-05-20 NOTE — Progress Notes (Signed)
Pre visit review using our clinic review tool, if applicable. No additional management support is needed unless otherwise documented below in the visit note. 

## 2014-05-21 ENCOUNTER — Encounter: Payer: PPO | Admitting: Thoracic Surgery (Cardiothoracic Vascular Surgery)

## 2014-05-21 ENCOUNTER — Encounter: Payer: Self-pay | Admitting: Internal Medicine

## 2014-05-21 NOTE — Progress Notes (Signed)
This encounter was created in error - please disregard.  This encounter was created in error - please disregard.

## 2014-05-26 ENCOUNTER — Other Ambulatory Visit: Payer: Self-pay | Admitting: Geriatric Medicine

## 2014-05-26 ENCOUNTER — Telehealth: Payer: Self-pay | Admitting: Internal Medicine

## 2014-05-26 DIAGNOSIS — I10 Essential (primary) hypertension: Secondary | ICD-10-CM

## 2014-05-26 MED ORDER — LABETALOL HCL 200 MG PO TABS: 200.0000 mg | ORAL_TABLET | Freq: Two times a day (BID) | ORAL | Status: DC

## 2014-05-26 MED ORDER — LISINOPRIL 40 MG PO TABS: 40.0000 mg | ORAL_TABLET | Freq: Every day | ORAL | Status: DC

## 2014-05-26 MED ORDER — POTASSIUM CITRATE ER 10 MEQ (1080 MG) PO TBCR: 10.0000 meq | EXTENDED_RELEASE_TABLET | Freq: Every day | ORAL | Status: DC

## 2014-05-26 MED ORDER — SITAGLIPTIN PHOSPHATE 100 MG PO TABS: 100.0000 mg | ORAL_TABLET | Freq: Every day | ORAL | Status: DC

## 2014-05-26 NOTE — Telephone Encounter (Signed)
Scripts were sent to [facility] again in error. Please send to [facility] on [address] as listed on chart. Thank you.   There are also several questions that she has regarding lab results from [facility] visit. She read on mychart, but has several questions regarding. Please call her.

## 2014-05-26 NOTE — Telephone Encounter (Signed)
Patient is xferred pharmacies, and requests that [facility] [address]. Changed in her preferences as well.   labetalol (NORMODYNE) 200 MG tablet [id number]  lisinopril (PRINIVIL,ZESTRIL) 40 MG tablet [id number]  potassium citrate (UROCIT-K) 10 MEQ (1080 MG) SR tablet [id number]  sitaGLIPtin (JANUVIA) 100 MG tablet [id number]

## 2014-05-26 NOTE — Telephone Encounter (Signed)
Sent to pharmacy 

## 2014-05-27 ENCOUNTER — Other Ambulatory Visit: Payer: Self-pay | Admitting: Geriatric Medicine

## 2014-05-27 MED ORDER — LISINOPRIL 40 MG PO TABS: 40.0000 mg | ORAL_TABLET | Freq: Every day | ORAL | Status: DC

## 2014-05-27 NOTE — Telephone Encounter (Signed)
Sent to pharmacy 

## 2014-05-27 NOTE — Telephone Encounter (Signed)
Pt called and wants lisinopril (PRINIVIL,ZESTRIL) 40 MG tablet [id number] [facility].    [place]

## 2014-05-29 NOTE — Telephone Encounter (Signed)
Please call patients daughter Stacey Page. She has several questions regarding lab results from [place] visit. She read on MyChart, but has several questions. Please call her today.

## 2014-05-30 ENCOUNTER — Institutional Professional Consult (permissible substitution) (INDEPENDENT_AMBULATORY_CARE_PROVIDER_SITE_OTHER): Payer: PPO | Admitting: Cardiothoracic Surgery

## 2014-05-30 ENCOUNTER — Encounter: Payer: Self-pay | Admitting: Cardiothoracic Surgery

## 2014-05-30 VITALS — BP 138/73 | HR 66 | Resp 20 | Ht 68.0 in | Wt 340.0 lb

## 2014-05-30 DIAGNOSIS — J849 Interstitial pulmonary disease, unspecified: Secondary | ICD-10-CM

## 2014-05-30 DIAGNOSIS — R918 Other nonspecific abnormal finding of lung field: Secondary | ICD-10-CM | POA: Diagnosis not present

## 2014-05-30 NOTE — Telephone Encounter (Signed)
Patient advised and explained all lab values, we are also checking to see if bp cuff can be bought/paid for by [employer], patient's insurance, in order for daughter, Cassell Smiles to take bp readings at home---patient mostly home bound and recent syncope episode that led patient to ER, and also recent bp med dosage decreased because bp was staying too low----we are waiting for [employer]alt[employer] adv to call back to talk with susan/wellness coach or tamara/Team lead with info about how to get bp cuff for this patient to use at home

## 2014-05-30 NOTE — Telephone Encounter (Signed)
Talked with daughter Cassell Smiles, she is needing a bp cuff for her mother to take bp readings at home---recent bp med dosage change and recent syncope episode that led patient to ER---daughter is wanting to know if healthteam adv will cover cost of this bp cuff-----susan/wellness RN has placed call to [employer] and requested coverage for bp cuff or alternative home monitoring, patient is basically home bound except for doctors visits, etc.---we called [employer] at provider line [phone] and they are supposed to be calling us back with answer, we are to call Cassell Smiles, daughter of patient with findings

## 2014-05-30 NOTE — Telephone Encounter (Signed)
Daughter was calling in again today, Dr Doug Sou or amy is not in office.  It there someone that can call her?     Best number [phone]

## 2014-06-02 ENCOUNTER — Other Ambulatory Visit: Payer: Self-pay | Admitting: *Deleted

## 2014-06-02 DIAGNOSIS — J8410 Pulmonary fibrosis, unspecified: Secondary | ICD-10-CM

## 2014-06-02 NOTE — Progress Notes (Signed)
PCP is Olga Millers, MD Referring Provider is Chesley Mires, MD  Chief Complaint  Patient presents with  . Interstitial Lung Disease    Surgical eval for possible Lung biopsy. Chest CT 03/20/14    HPI: Patient presents with recent diagnosis of interstitial lung disease by high definition CT scan associated with clinical symptoms of progressive shortness of breath and decreasing exercise tolerance. The patient was placed on prednisone empirically by her pulmonologist with some improvement in her symptoms. Before long-term prednisone is recommended the patient's pulmonologist wishes a lung biopsy to document the type of interstitial lung disease. The patient is a nonsmoker and is had no previous chest surgery. She is morbidly obese with diabetes and difficulty performing daily activities. The patient has had echocardiogram which was normal, PFTs which are adequate for VATS lung biopsy, with DLCO reduced at 52%.   Past Medical History  Diagnosis Date  . HTN (hypertension)   . Asthma   . Diabetes   . Seasonal allergies   . OA (osteoarthritis)   . OSA (obstructive sleep apnea) 02/25/2014  . Interstitial lung disease 03/26/2014    Past Surgical History  Procedure Laterality Date  . Cesarean section  x2  . Polypectomy      throat    Family History  Problem Relation Age of Onset  . COPD Father   . Cancer Mother     Social History History  Substance Use Topics  . Smoking status: Former Smoker -- 0.25 packs/day for 15 years    Types: Cigarettes    Quit date: 02/08/1980  . Smokeless tobacco: Not on file     Comment: 2 packs per week  . Alcohol Use: 0.0 oz/week    0 Standard drinks or equivalent per week     Comment: occassional/social/rare    Current Outpatient Prescriptions  Medication Sig Dispense Refill  . albuterol (PROAIR HFA) 108 (90 BASE) MCG/ACT inhaler Inhale 2 puffs into the lungs every 6 (six) hours as needed for wheezing or shortness of breath. 1 Inhaler 3  .  budesonide-formoterol (SYMBICORT) 160-4.5 MCG/ACT inhaler Inhale 2 puffs into the lungs 2 (two) times daily.    . Clobetasol Prop Emollient Base (CLOBETASOL PROPIONATE E) 0.05 % emollient cream Apply 1 application topically daily as needed (skin irritations).     . fluticasone (FLONASE) 50 MCG/ACT nasal spray Place 1 spray into both nostrils daily.    . furosemide (LASIX) 40 MG tablet Take 1 tablet (40 mg total) by mouth daily. 90 tablet 1  . labetalol (NORMODYNE) 200 MG tablet Take 1 tablet (200 mg total) by mouth 2 (two) times daily. 180 tablet 3  . lisinopril (PRINIVIL,ZESTRIL) 40 MG tablet Take 1 tablet (40 mg total) by mouth daily. 90 tablet 3  . montelukast (SINGULAIR) 10 MG tablet Take 10 mg by mouth at bedtime.    . potassium citrate (UROCIT-K) 10 MEQ (1080 MG) SR tablet Take 1 tablet (10 mEq total) by mouth daily. 90 tablet 3  . predniSONE (DELTASONE) 20 MG tablet Taper down to 20mg  and hold at this dose until next office visit 30 tablet 1  . sitaGLIPtin (JANUVIA) 100 MG tablet Take 1 tablet (100 mg total) by mouth daily. 30 tablet 3   No current facility-administered medications for this visit.    No Known Allergies  Review of Systems  Gen.-weight gain on prednisone, no fever HEENT-no change in vision, no active dental complaints Thorax-shortness of breath with exertion, dry cough Cardiac-normal 2-D echocardiogram, no history of  angina GI-no history of hepatitis jaundice blood per rectum Endocrine-positive for diabetes not well controlled Extremities-moderate arthritis, positive for ankle edema  Neurologic-no history stroke or seizure Hematologic-no bleeding disorders  BP 138/73 mmHg  Pulse 66  Resp 20  Ht 5\' 8"  (1.727 m)  Wt 340 lb (154.223 kg)  BMI 51.71 kg/m2  SpO2 92% Physical Exam  General: Morbid obese AA female short of breath with minimal exertion or ambulation   HEENT: Normocephalic pupils equal , dentition adequate Neck: Supple without JVD, adenopathy, or  bruit Chest: Clear to auscultation, symmetrical breath sounds, no rhonchi, no tenderness             or deformity Cardiovascular: Regular rate and rhythm, no murmur, no gallop, peripheral pulses             palpable in all extremities Abdomen:  Soft, nontender, no palpable mass or organomegaly Extremities: Warm, well-perfused, no clubbing cyanosis edema or tenderness,              no venous stasis changes of the legs Rectal/GU: Deferred Neuro: Grossly non--focal and symmetrical throughout Skin: Clean and dry without rash or ulceration   Diagnostic Tests: High-definition CT scan showing interstitial lung disease in the upper lobes right greater than left. No significant adenopathy or discrete pulmonary masses.  Impression: Patient with symptomatic undiagnosed lung disease felt to be interstitial point fibrosis and prednisone has been started. I feel it is appropriate to proceed with VATS lung biopsy, although at increased risk due to her morbid obesity, because the biopsy will help direct long-term therapy and confirm the need for long-term steroids in this already obese diabetic female.  Plan:VATS lung biopsy scheduled for mid May 2016. Patient understands this is at increased risk for postoperative pulmonary complications and increased risk for bleeding due to her morbid obesity obesity.   Len Childs, MD [facility] [phone]

## 2014-06-03 LAB — COLOGUARD: COLOGUARD: NEGATIVE

## 2014-06-06 ENCOUNTER — Telehealth: Payer: Self-pay | Admitting: Internal Medicine

## 2014-06-06 ENCOUNTER — Ambulatory Visit (HOSPITAL_COMMUNITY)
Admission: RE | Admit: 2014-06-06 | Discharge: 2014-06-06 | Disposition: A | Discharge status: Home / Self Care | Payer: PPO | Source: Ambulatory Visit | Attending: Cardiothoracic Surgery | Admitting: Cardiothoracic Surgery

## 2014-06-06 ENCOUNTER — Encounter (HOSPITAL_COMMUNITY): Payer: Self-pay

## 2014-06-06 ENCOUNTER — Encounter (HOSPITAL_COMMUNITY)
Admission: RE | Admit: 2014-06-06 | Discharge: 2014-06-06 | Disposition: A | Discharge status: Home / Self Care | Payer: PPO | Source: Ambulatory Visit | Attending: Cardiothoracic Surgery | Admitting: Cardiothoracic Surgery

## 2014-06-06 VITALS — BP 129/92 | HR 98 | Temp 98.1°F | Resp 20 | Ht 68.0 in | Wt 345.2 lb

## 2014-06-06 DIAGNOSIS — J8410 Pulmonary fibrosis, unspecified: Secondary | ICD-10-CM | POA: Diagnosis not present

## 2014-06-06 DIAGNOSIS — Z01818 Encounter for other preprocedural examination: Secondary | ICD-10-CM | POA: Diagnosis present

## 2014-06-06 HISTORY — DX: Depression, unspecified: F32.A

## 2014-06-06 HISTORY — DX: Gastro-esophageal reflux disease without esophagitis: K21.9

## 2014-06-06 HISTORY — DX: Anxiety disorder, unspecified: F41.9

## 2014-06-06 HISTORY — DX: Major depressive disorder, single episode, unspecified: F32.9

## 2014-06-06 LAB — URINALYSIS, ROUTINE W REFLEX MICROSCOPIC
Bilirubin Urine: NEGATIVE
Glucose, UA: NEGATIVE mg/dL
Hgb urine dipstick: NEGATIVE
Ketones, ur: NEGATIVE mg/dL
Leukocytes, UA: NEGATIVE
Nitrite: NEGATIVE
Protein, ur: NEGATIVE mg/dL
Specific Gravity, Urine: 1.008 (ref 1.005–1.030)
Urobilinogen, UA: 0.2 mg/dL (ref 0.0–1.0)
pH: 5 (ref 5.0–8.0)

## 2014-06-06 LAB — COMPREHENSIVE METABOLIC PANEL
ALT: 22 U/L (ref 0–35)
AST: 15 U/L (ref 0–37)
Albumin: 3.7 g/dL (ref 3.5–5.2)
Alkaline Phosphatase: 54 U/L (ref 39–117)
Anion gap: 12 (ref 5–15)
BUN: 38 mg/dL — ABNORMAL HIGH (ref 6–23)
CO2: 23 mmol/L (ref 19–32)
Calcium: 9.8 mg/dL (ref 8.4–10.5)
Chloride: 103 mmol/L (ref 96–112)
Creatinine, Ser: 1.98 mg/dL — ABNORMAL HIGH (ref 0.50–1.10)
GFR calc Af Amer: 30 mL/min — ABNORMAL LOW (ref >90)
GFR calc non Af Amer: 26 mL/min — ABNORMAL LOW (ref >90)
Glucose, Bld: 116 mg/dL — ABNORMAL HIGH (ref 70–99)
Potassium: 4.2 mmol/L (ref 3.5–5.1)
Sodium: 138 mmol/L (ref 135–145)
Total Bilirubin: 0.6 mg/dL (ref 0.3–1.2)
Total Protein: 7.3 g/dL (ref 6.0–8.3)

## 2014-06-06 LAB — BLOOD GAS, ARTERIAL
Acid-Base Excess: 0.2 mmol/L (ref 0.0–2.0)
Bicarbonate: 24 mEq/L (ref 20.0–24.0)
Drawn by: 421801
FIO2: 0.21 %
O2 Saturation: 96.2 %
Patient temperature: 98.6
TCO2: 25.1 mmol/L (ref 0–100)
pCO2 arterial: 36.5 mmHg (ref 35.0–45.0)
pH, Arterial: 7.433 (ref 7.350–7.450)
pO2, Arterial: 87.2 mmHg (ref 80.0–100.0)

## 2014-06-06 LAB — CBC
HCT: 34.6 % — ABNORMAL LOW (ref 36.0–46.0)
Hemoglobin: 11.1 g/dL — ABNORMAL LOW (ref 12.0–15.0)
MCH: 29.1 pg (ref 26.0–34.0)
MCHC: 32.1 g/dL (ref 30.0–36.0)
MCV: 90.8 fL (ref 78.0–100.0)
Platelets: 233 10*3/uL (ref 150–400)
RBC: 3.81 MIL/uL — ABNORMAL LOW (ref 3.87–5.11)
RDW: 16 % — ABNORMAL HIGH (ref 11.5–15.5)
WBC: 8.3 10*3/uL (ref 4.0–10.5)

## 2014-06-06 LAB — APTT: aPTT: 30 seconds (ref 24–37)

## 2014-06-06 LAB — SURGICAL PCR SCREEN
MRSA, PCR: NEGATIVE
Staphylococcus aureus: NEGATIVE

## 2014-06-06 LAB — PROTIME-INR
INR: 1.11 (ref 0.00–1.49)
Prothrombin Time: 14.5 seconds (ref 11.6–15.2)

## 2014-06-06 LAB — ABO/RH: ABO/RH(D): O POS

## 2014-06-06 LAB — TYPE AND SCREEN
ABO/RH(D): O POS
Antibody Screen: NEGATIVE

## 2014-06-06 NOTE — Telephone Encounter (Signed)
Patient states she is having surgery on Tuesday.  States she was prescribed prednisone.  States she only has two tablets left and wants to know if she is to get refilled.  [facility] [address]

## 2014-06-06 NOTE — Progress Notes (Signed)
PCP is Vertell Novak. Patient denied having any acute cardiac or pulmonary issues. Patient did inform Nurse that she has noticed some tongue swelling that has been occuring only at night over the past few weeks after eating. Patients daughter informed Nurse that patients tongue started to swelling while they were eating at [employer]. Patients daughter had a picture of patients tongue on her cell phone. Patient denied having any difficult swallowing or chewing. Patient denied having any tongue swelling today. Patient also informed Nurse that she has also been having some stiffness in both her hands. Patient stated "sometimes when I'm eating I drop the utensils I'm using." Patients daughter stated it is better if she uses plastic utensil while eating. Patient stated she informed Dr. Prescott Gum of this on her LOV. Will inform [place], [place] of this.  Patients daughter at chair side during PAT visit.   Patient informed Nurse that she last used Albuterol inhaler yesterday.   Patient informed Nurse that she does not check her blood glucose at home.

## 2014-06-06 NOTE — Pre-Procedure Instructions (Signed)
Stacey Page  06/06/2014   Your procedure is scheduled on:  Tuesday Jun 10, 2014 at 7:30 AM.  Report to [facility] Admitting at 5:30 AM.  Call this number if you have problems the morning of surgery: [phone]   Remember:   Do not eat food or drink liquids after midnight.   Take these medicines the morning of surgery with A SIP OF WATER: Albuterol inhaler if needed, Symbicort inhaler, Flonase nasal spray, Labetalol (Normodyne)   Do NOT take any diabetic medications the morning of your surgery (ex. Januvia)   Please stop taking any vitamins, herbal medications, Advil, Ibuprofen, Motrin, Alleve, etc   Do not wear jewelry, make-up or nail polish.  Do not wear lotions, powders, or perfumes. You may NOT wear deodorant.  Do not shave 48 hours prior to surgery.   Do not bring valuables to the hospital.  [facility] is not responsible for any belongings or valuables.               Contacts, dentures or bridgework may not be worn into surgery.  Leave suitcase in the car. After surgery it may be brought to your room.  For patients admitted to the hospital, discharge time is determined by your treatment team.               Patients discharged the day of surgery will not be allowed to drive home.  Name and phone number of your driver:   Special Instructions: Shower using CHG soap the night before and the morning of your surgery   Please read over the following fact sheets that you were given: Pain Booklet, Coughing and Deep Breathing, Blood Transfusion Information, MRSA Information and Surgical Site Infection Prevention

## 2014-06-06 NOTE — Telephone Encounter (Signed)
Her lung doctor prescribed this but I do not have a reason to refill.

## 2014-06-06 NOTE — Progress Notes (Signed)
Anesthesia PAT Evaluation:  Patient is a 64 year old female scheduled for right VATS, lung biopsy for ILD in hopes to direct long-term therapy needs. Procedure is scheduled for 06/10/14 by Dr. Prescott Gum.   History includes former smoker since '82, HTN, asthma, ILD, DM2, anxiety, depression, GERD, OSA with CPAP use, osteoarthritis.  BMI is 52.5 consistent with morbid obesity. She was also seen in the ED for a syncopal episode on 05/23/14. She saw Dr. Scarlette Calico for ED follow-up, and he felt syncope was likely related to bradycardia (HR 50's) and hypotension (SBP 100-110 and DBP 20-40's) and decreased her dose of labetalol. PCP is Dr. Vertell Novak with [facility].  Pulmonologist is Dr. Halford Chessman. She is also being referred to rheumatology. Patient moved from [place] last July.  I was asked to see patient due to reports of intermittent right tongue swelling--patient describes it as a tightness or spasm that lasts 30-60 seconds.  This has happened 4-5 times over the past three or so weeks. It has only occurred on evenings that she ate out--but didn't always eat the same food.  She said the left side of her tongue will also feel tight, but does not look swollen.  Once she felt like she couldn't speak because her her tongue tightening, but as soon as "spasm" resolved she denied any dysarthria which was confirmed by her daughter.  Symptoms are not associated with SOB or dysphagia. She has also has had intermittent stiffness in her hands which seems more prominent when using metal utensils.  This is also brief and otherwise not associated with other symptoms.  She has not had any further syncope since 05/23/14.  No head CT or MRI was done.  She denied chest pain or palpitations.  She has chronic SOB.  She is not able to be very active due to her breathing. She is also morbidly obese. She does not check her glucose on a regular basis, but is compliant with her medications.  No known history of CVA or MI.   PAT VITALS: BP  129/92, HR 98, RR 20, T 36.7. Exam showed a pleasant black female in NAD.  Good mouth opening. No tongue swelling currently.  I did not appreciate any carotid bruits.  Bilateral grips strong, symmetrical.  Heart RRR, grade II/VI harsh SEM. Lungs clear.   Meds include Proair HFA, Symbicort, Flonase, Lasix, labetalol, lisinopril, Singulair, potassium citrate, prednisone, Januvia. She only has two prednisone tablets left and pulmonology records indicate she should stay on 20 mg until her next visit. I advised that she should contact Dr. Juanetta Gosling office for further instructions regarding possible refill, further taper, etc. Patient would like for it to be tapered off because she has been having trouble sleeping, irritability, increased appetite while on prednisone.  05/19/14 EKG: SB with first degree AVB, LAD, LVH with repolarization abnormality. There are no comparison EKGs prior to this date in Epic or Muse.  01/28/14 Echo: Study Conclusions - Left ventricle: The cavity size was normal. Wall thickness was increased in a pattern of severe LVH. Systolic function was vigorous. The estimated ejection fraction was in the range of 65% to 70%. Wall motion was normal; there were no regional wall motion abnormalities. Features are consistent with a pseudonormal left ventricular filling pattern, with concomitant abnormalrelaxation and increased filling pressure (grade 2 diastolicdysfunction). Doppler parameters are consistent with high ventricular filling pressure. - Aortic valve: There was mild stenosis. Valve area (VTI): 2.19 cm^2. Valve area (Vmean): 2.09 cm^2. - Mitral valve: Calcified  annulus. - Left atrium: The atrium was mildly to moderately dilated. Impressions: Vigorous LV function; severe LVH; grade 2 diastolic dysfunction with elevated LV filling pressure; mild to moderate LAE;calcified aortic valve with mild AS. (We discussed that she will need periodic evaluation for her AS, and to discuss further with  Dr. Doug Sou.)  PER PULMONOLOGY RECORDS: Echo 01/28/14 >> severe LVH, EF 65 to [id number], grade 2 diastolic dysfx, mild AS, mild/mod LA. HST 02/12/14 >> AHI 59.3, SaO2 low 67% Auto CPAP 03/07/14 to 03/09/14 >> used on 3 of 3 nights with average 8 hrs and 8 min. Average AHI is 3.3 with median CPAP 12 cm H2O and 95 th percentile CPAP 14 cm H20. PFT 03/12/14 >> FEV1 2.05 (88%), FEV1% 87, TLC 4.14 (75%), DLCO 52%, no BD  03/20/14 CT chest: IMPRESSION: 1. The appearance of the lungs suggests interstitial lung disease, and the overall pattern is favored to reflect nonspecific interstitial pneumonia (NSIP). Repeat high-resolution chest CT in 1 year may be useful to assess for temporal changes in the appearance of the lung parenchyma if clinically appropriate. 2. Multiple small pulmonary nodules scattered throughout the lungs bilaterally, largest of which has a mean diameter of 6 mm in the subpleural aspect of the left lower lobe. These largest pulmonary nodules are subpleural in location and favored to represent subpleural lymph nodes, but attention at time of repeat chest CT in 1 year is recommended. 3. Nonspecific borderline enlarged and mildly enlarged mediastinal and hilar lymph nodes, as above. This is likely related to underlying interstitial lung disease, but attention at the time of follow-up chest CT is recommended to ensure the stability of these findings.  06/06/14 CXR: No active cardiopulmonary disease.  Preoperative labs noted.  K 4.2. BUN 38, Cr 1.98, up from BUN 26-32, Cr 1.55-1.79 since 03/12/14. Glucose 116. H/H 11.1/34.6. PT/PTT WNL. 4/121/6 TSH WNL, A1C 6.8. I'll order an ISTAT8 on arrival to ensure stability of her renal function.   Patient without any acute symptoms at present.  Her brief tongue and hand symptoms have been only since she was started on prednisone.  I am unsure if these are related or not.  She showed me pictures which did not show significant swelling on the right, but there was  some difference in size.  It did not have the typical appearance of angioedema. She reports she has already discussed her symptoms with Dr. Prescott Gum and with Dr. Ronnald Ramp although I don't see it mentioned in their notes.  I advised her to seek emergency medical attention if symptoms progress or do no resolve. I think she will need to discuss further with Dr. Doug Sou.  Unclear if this is medication reaction (ie to prednisone or ACEI) or if she may need further neurology evaluation.  I discussed with anesthesiologist Dr. Linna Caprice.  If she is asymptomatic on the day of surgery, he felt she could likely proceed as planned but should have post-operative follow-up with her PCP.  If there was any concern for tongue swelling on the day of surgery then post-operative extubation would be delayed. I will route my note to Dr. Doug Sou.   George Hugh [facility] Short Stay Center/Anesthesiology Phone [phone] 06/06/2014 5:09 PM

## 2014-06-09 MED ORDER — DEXTROSE 5 % IV SOLN
1.5000 g | INTRAVENOUS | Status: AC
  Administered 2014-06-10: 1.5 g via INTRAVENOUS
  Filled 2014-06-09: qty 1.5

## 2014-06-09 NOTE — Telephone Encounter (Signed)
Advised patient of dr Jeraldine Loots note, patient will contact lung doctor

## 2014-06-10 ENCOUNTER — Encounter (HOSPITAL_COMMUNITY): Payer: Self-pay | Admitting: *Deleted

## 2014-06-10 ENCOUNTER — Inpatient Hospital Stay (HOSPITAL_COMMUNITY)
Admission: RE | Admit: 2014-06-10 | Discharge: 2014-06-14 | DRG: 167 | Disposition: A | Discharge status: Home / Self Care | Payer: PPO | Source: Ambulatory Visit | Attending: Cardiothoracic Surgery | Admitting: Cardiothoracic Surgery

## 2014-06-10 ENCOUNTER — Inpatient Hospital Stay (HOSPITAL_COMMUNITY): Payer: PPO

## 2014-06-10 ENCOUNTER — Inpatient Hospital Stay (HOSPITAL_COMMUNITY): Payer: PPO | Admitting: Vascular Surgery

## 2014-06-10 ENCOUNTER — Inpatient Hospital Stay (HOSPITAL_COMMUNITY): Payer: PPO | Admitting: Certified Registered Nurse Anesthetist

## 2014-06-10 ENCOUNTER — Encounter (HOSPITAL_COMMUNITY)
Admission: RE | Disposition: A | Discharge status: Home / Self Care | Payer: Self-pay | Source: Ambulatory Visit | Attending: Cardiothoracic Surgery

## 2014-06-10 DIAGNOSIS — N189 Chronic kidney disease, unspecified: Secondary | ICD-10-CM | POA: Diagnosis present

## 2014-06-10 DIAGNOSIS — J8410 Pulmonary fibrosis, unspecified: Principal | ICD-10-CM | POA: Diagnosis present

## 2014-06-10 DIAGNOSIS — Z87891 Personal history of nicotine dependence: Secondary | ICD-10-CM

## 2014-06-10 DIAGNOSIS — I129 Hypertensive chronic kidney disease with stage 1 through stage 4 chronic kidney disease, or unspecified chronic kidney disease: Secondary | ICD-10-CM | POA: Diagnosis not present

## 2014-06-10 DIAGNOSIS — E875 Hyperkalemia: Secondary | ICD-10-CM | POA: Diagnosis not present

## 2014-06-10 DIAGNOSIS — J45909 Unspecified asthma, uncomplicated: Secondary | ICD-10-CM | POA: Diagnosis present

## 2014-06-10 DIAGNOSIS — Z7951 Long term (current) use of inhaled steroids: Secondary | ICD-10-CM

## 2014-06-10 DIAGNOSIS — G4733 Obstructive sleep apnea (adult) (pediatric): Secondary | ICD-10-CM | POA: Diagnosis present

## 2014-06-10 DIAGNOSIS — Z9889 Other specified postprocedural states: Secondary | ICD-10-CM

## 2014-06-10 DIAGNOSIS — J849 Interstitial pulmonary disease, unspecified: Secondary | ICD-10-CM | POA: Diagnosis not present

## 2014-06-10 DIAGNOSIS — M1990 Unspecified osteoarthritis, unspecified site: Secondary | ICD-10-CM | POA: Diagnosis present

## 2014-06-10 DIAGNOSIS — Z419 Encounter for procedure for purposes other than remedying health state, unspecified: Secondary | ICD-10-CM

## 2014-06-10 DIAGNOSIS — Z6841 Body Mass Index (BMI) 40.0 and over, adult: Secondary | ICD-10-CM

## 2014-06-10 DIAGNOSIS — E119 Type 2 diabetes mellitus without complications: Secondary | ICD-10-CM | POA: Diagnosis not present

## 2014-06-10 DIAGNOSIS — Z9689 Presence of other specified functional implants: Secondary | ICD-10-CM

## 2014-06-10 DIAGNOSIS — J984 Other disorders of lung: Secondary | ICD-10-CM | POA: Diagnosis present

## 2014-06-10 HISTORY — PX: VIDEO ASSISTED THORACOSCOPY: SHX5073

## 2014-06-10 HISTORY — PX: LUNG BIOPSY: SHX5088

## 2014-06-10 LAB — POCT I-STAT, CHEM 8
BUN: 35 mg/dL — ABNORMAL HIGH (ref 6–20)
Calcium, Ion: 1.22 mmol/L (ref 1.13–1.30)
Chloride: 105 mmol/L (ref 101–111)
Creatinine, Ser: 2 mg/dL — ABNORMAL HIGH (ref 0.44–1.00)
Glucose, Bld: 113 mg/dL — ABNORMAL HIGH (ref 70–99)
HCT: 37 % (ref 36.0–46.0)
Hemoglobin: 12.6 g/dL (ref 12.0–15.0)
Potassium: 4.2 mmol/L (ref 3.5–5.1)
Sodium: 137 mmol/L (ref 135–145)
TCO2: 19 mmol/L (ref 0–100)

## 2014-06-10 LAB — GLUCOSE, CAPILLARY
Glucose-Capillary: 105 mg/dL — ABNORMAL HIGH (ref 70–99)
Glucose-Capillary: 123 mg/dL — ABNORMAL HIGH (ref 70–99)
Glucose-Capillary: 123 mg/dL — ABNORMAL HIGH (ref 70–99)
Glucose-Capillary: 128 mg/dL — ABNORMAL HIGH (ref 70–99)
Glucose-Capillary: 148 mg/dL — ABNORMAL HIGH (ref 70–99)

## 2014-06-10 LAB — BLOOD GAS, ARTERIAL
Acid-base deficit: 3.1 mmol/L — ABNORMAL HIGH (ref 0.0–2.0)
Bicarbonate: 22.4 mEq/L (ref 20.0–24.0)
O2 Content: 6 L/min
O2 Saturation: 98.9 %
Patient temperature: 97.2
TCO2: 23.8 mmol/L (ref 0–100)
pCO2 arterial: 45.3 mmHg — ABNORMAL HIGH (ref 35.0–45.0)
pH, Arterial: 7.31 — ABNORMAL LOW (ref 7.350–7.450)
pO2, Arterial: 149 mmHg — ABNORMAL HIGH (ref 80.0–100.0)

## 2014-06-10 SURGERY — VIDEO ASSISTED THORACOSCOPY
Anesthesia: General | Site: Chest | Laterality: Right

## 2014-06-10 MED ORDER — OXYCODONE HCL 5 MG PO TABS
5.0000 mg | ORAL_TABLET | ORAL | Status: DC | PRN
  Administered 2014-06-10 – 2014-06-11 (×3): 10 mg via ORAL
  Filled 2014-06-10 (×3): qty 2

## 2014-06-10 MED ORDER — LISINOPRIL 40 MG PO TABS
40.0000 mg | ORAL_TABLET | Freq: Every day | ORAL | Status: DC
  Filled 2014-06-10 (×2): qty 1

## 2014-06-10 MED ORDER — PROPOFOL 10 MG/ML IV BOLUS
INTRAVENOUS | Status: DC | PRN
  Administered 2014-06-10: 150 mg via INTRAVENOUS
  Administered 2014-06-10: 20 mg via INTRAVENOUS
  Administered 2014-06-10: 40 mg via INTRAVENOUS
  Administered 2014-06-10: 50 mg via INTRAVENOUS
  Administered 2014-06-10: 30 mg via INTRAVENOUS

## 2014-06-10 MED ORDER — LACTATED RINGERS IV SOLN
INTRAVENOUS | Status: DC | PRN
  Administered 2014-06-10: 08:00:00 via INTRAVENOUS

## 2014-06-10 MED ORDER — ONDANSETRON HCL 4 MG/2ML IJ SOLN
INTRAMUSCULAR | Status: AC
  Filled 2014-06-10: qty 2

## 2014-06-10 MED ORDER — DIPHENHYDRAMINE HCL 12.5 MG/5ML PO ELIX: 12.5000 mg | ORAL_SOLUTION | Freq: Four times a day (QID) | ORAL | Status: DC | PRN

## 2014-06-10 MED ORDER — PROPOFOL 10 MG/ML IV BOLUS
INTRAVENOUS | Status: AC
  Filled 2014-06-10: qty 20

## 2014-06-10 MED ORDER — DIPHENHYDRAMINE HCL 50 MG/ML IJ SOLN: 12.5000 mg | Freq: Four times a day (QID) | INTRAMUSCULAR | Status: DC | PRN

## 2014-06-10 MED ORDER — FENTANYL CITRATE (PF) 100 MCG/2ML IJ SOLN
INTRAMUSCULAR | Status: AC
Start: 2014-06-10 — End: 2014-06-10
  Filled 2014-06-10: qty 2

## 2014-06-10 MED ORDER — FENTANYL CITRATE (PF) 100 MCG/2ML IJ SOLN
INTRAMUSCULAR | Status: DC | PRN
  Administered 2014-06-10 (×5): 50 ug via INTRAVENOUS

## 2014-06-10 MED ORDER — OXYCODONE HCL 5 MG/5ML PO SOLN: 5.0000 mg | Freq: Once | ORAL | Status: DC | PRN

## 2014-06-10 MED ORDER — MIDAZOLAM HCL 2 MG/2ML IJ SOLN
INTRAMUSCULAR | Status: AC
  Filled 2014-06-10: qty 2

## 2014-06-10 MED ORDER — ALBUTEROL SULFATE HFA 108 (90 BASE) MCG/ACT IN AERS: 2.0000 | INHALATION_SPRAY | Freq: Four times a day (QID) | RESPIRATORY_TRACT | Status: DC | PRN

## 2014-06-10 MED ORDER — ACETAMINOPHEN 10 MG/ML IV SOLN
INTRAVENOUS | Status: DC | PRN
  Administered 2014-06-10: 1000 mg via INTRAVENOUS

## 2014-06-10 MED ORDER — SENNOSIDES-DOCUSATE SODIUM 8.6-50 MG PO TABS
1.0000 | ORAL_TABLET | Freq: Every day | ORAL | Status: DC
  Administered 2014-06-11: 1 via ORAL
  Filled 2014-06-10 (×5): qty 1

## 2014-06-10 MED ORDER — OXYCODONE HCL 5 MG PO TABS: 5.0000 mg | ORAL_TABLET | Freq: Once | ORAL | Status: DC | PRN

## 2014-06-10 MED ORDER — FENTANYL CITRATE (PF) 250 MCG/5ML IJ SOLN
INTRAMUSCULAR | Status: AC
  Filled 2014-06-10: qty 5

## 2014-06-10 MED ORDER — ACETAMINOPHEN 160 MG/5ML PO SOLN
325.0000 mg | ORAL | Status: DC | PRN
  Filled 2014-06-10: qty 20.3

## 2014-06-10 MED ORDER — PHENYLEPHRINE HCL 10 MG/ML IJ SOLN
INTRAMUSCULAR | Status: DC | PRN
  Administered 2014-06-10 (×2): 80 ug via INTRAVENOUS

## 2014-06-10 MED ORDER — TRAMADOL HCL 50 MG PO TABS
50.0000 mg | ORAL_TABLET | Freq: Four times a day (QID) | ORAL | Status: DC | PRN
  Administered 2014-06-11 – 2014-06-12 (×3): 100 mg via ORAL
  Administered 2014-06-13 – 2014-06-14 (×2): 50 mg via ORAL
  Filled 2014-06-10: qty 2
  Filled 2014-06-10: qty 1
  Filled 2014-06-10 (×2): qty 2
  Filled 2014-06-10: qty 1

## 2014-06-10 MED ORDER — CETYLPYRIDINIUM CHLORIDE 0.05 % MT LIQD
7.0000 mL | Freq: Two times a day (BID) | OROMUCOSAL | Status: DC
  Administered 2014-06-10 – 2014-06-11 (×2): 7 mL via OROMUCOSAL

## 2014-06-10 MED ORDER — BISACODYL 5 MG PO TBEC
10.0000 mg | DELAYED_RELEASE_TABLET | Freq: Every day | ORAL | Status: DC
  Administered 2014-06-11 – 2014-06-14 (×3): 10 mg via ORAL
  Filled 2014-06-10 (×4): qty 2

## 2014-06-10 MED ORDER — GLYCOPYRROLATE 0.2 MG/ML IJ SOLN
INTRAMUSCULAR | Status: DC | PRN
  Administered 2014-06-10: 1 mg via INTRAVENOUS

## 2014-06-10 MED ORDER — FENTANYL CITRATE (PF) 100 MCG/2ML IJ SOLN
25.0000 ug | INTRAMUSCULAR | Status: DC | PRN
  Administered 2014-06-10 (×3): 25 ug via INTRAVENOUS

## 2014-06-10 MED ORDER — GLYCOPYRROLATE 0.2 MG/ML IJ SOLN
INTRAMUSCULAR | Status: AC
  Filled 2014-06-10: qty 3

## 2014-06-10 MED ORDER — ACETAMINOPHEN 325 MG PO TABS: 325.0000 mg | ORAL_TABLET | ORAL | Status: DC | PRN

## 2014-06-10 MED ORDER — ONDANSETRON HCL 4 MG/2ML IJ SOLN
4.0000 mg | Freq: Four times a day (QID) | INTRAMUSCULAR | Status: DC | PRN
  Administered 2014-06-11: 4 mg via INTRAVENOUS
  Filled 2014-06-10: qty 2

## 2014-06-10 MED ORDER — ONDANSETRON HCL 4 MG/2ML IJ SOLN
INTRAMUSCULAR | Status: DC | PRN
  Administered 2014-06-10: 4 mg via INTRAVENOUS

## 2014-06-10 MED ORDER — LINAGLIPTIN 5 MG PO TABS
5.0000 mg | ORAL_TABLET | Freq: Every day | ORAL | Status: DC
  Administered 2014-06-11 – 2014-06-14 (×4): 5 mg via ORAL
  Filled 2014-06-10 (×4): qty 1

## 2014-06-10 MED ORDER — POTASSIUM CHLORIDE 10 MEQ/50ML IV SOLN: 10.0000 meq | Freq: Every day | INTRAVENOUS | Status: DC | PRN

## 2014-06-10 MED ORDER — VECURONIUM BROMIDE 10 MG IV SOLR
INTRAVENOUS | Status: DC | PRN
  Administered 2014-06-10: 2 mg via INTRAVENOUS
  Administered 2014-06-10: 1 mg via INTRAVENOUS
  Administered 2014-06-10: 3 mg via INTRAVENOUS
  Administered 2014-06-10: 1 mg via INTRAVENOUS
  Administered 2014-06-10: 3 mg via INTRAVENOUS

## 2014-06-10 MED ORDER — ACETAMINOPHEN 160 MG/5ML PO SOLN
1000.0000 mg | Freq: Four times a day (QID) | ORAL | Status: DC
  Filled 2014-06-10: qty 40

## 2014-06-10 MED ORDER — INSULIN ASPART 100 UNIT/ML ~~LOC~~ SOLN
0.0000 [IU] | SUBCUTANEOUS | Status: DC
  Administered 2014-06-10 – 2014-06-13 (×13): 2 [IU] via SUBCUTANEOUS
  Administered 2014-06-13: 4 [IU] via SUBCUTANEOUS
  Administered 2014-06-14: 2 [IU] via SUBCUTANEOUS

## 2014-06-10 MED ORDER — PHENYLEPHRINE HCL 10 MG/ML IJ SOLN
10.0000 mg | INTRAVENOUS | Status: DC | PRN
  Administered 2014-06-10 (×2): 25 ug/min via INTRAVENOUS

## 2014-06-10 MED ORDER — PHENYLEPHRINE 40 MCG/ML (10ML) SYRINGE FOR IV PUSH (FOR BLOOD PRESSURE SUPPORT)
PREFILLED_SYRINGE | INTRAVENOUS | Status: AC
  Filled 2014-06-10: qty 10

## 2014-06-10 MED ORDER — ARTIFICIAL TEARS OP OINT
TOPICAL_OINTMENT | OPHTHALMIC | Status: DC | PRN
  Administered 2014-06-10: 1 via OPHTHALMIC

## 2014-06-10 MED ORDER — ALBUTEROL SULFATE (2.5 MG/3ML) 0.083% IN NEBU: 2.5000 mg | INHALATION_SOLUTION | Freq: Four times a day (QID) | RESPIRATORY_TRACT | Status: DC | PRN

## 2014-06-10 MED ORDER — EPHEDRINE SULFATE 50 MG/ML IJ SOLN
INTRAMUSCULAR | Status: DC | PRN
  Administered 2014-06-10 (×3): 5 mg via INTRAVENOUS
  Administered 2014-06-10: 10 mg via INTRAVENOUS

## 2014-06-10 MED ORDER — MIDAZOLAM HCL 5 MG/5ML IJ SOLN
INTRAMUSCULAR | Status: DC | PRN
  Administered 2014-06-10 (×2): 1 mg via INTRAVENOUS

## 2014-06-10 MED ORDER — ROCURONIUM BROMIDE 50 MG/5ML IV SOLN
INTRAVENOUS | Status: AC
  Filled 2014-06-10: qty 1

## 2014-06-10 MED ORDER — SODIUM CHLORIDE 0.9 % IJ SOLN: 9.0000 mL | INTRAMUSCULAR | Status: DC | PRN

## 2014-06-10 MED ORDER — ARTIFICIAL TEARS OP OINT
TOPICAL_OINTMENT | OPHTHALMIC | Status: AC
  Filled 2014-06-10: qty 3.5

## 2014-06-10 MED ORDER — DEXTROSE 5 % IV SOLN
1.5000 g | Freq: Two times a day (BID) | INTRAVENOUS | Status: AC
  Administered 2014-06-10 – 2014-06-11 (×2): 1.5 g via INTRAVENOUS
  Filled 2014-06-10 (×3): qty 1.5

## 2014-06-10 MED ORDER — MONTELUKAST SODIUM 10 MG PO TABS
10.0000 mg | ORAL_TABLET | Freq: Every day | ORAL | Status: DC
  Administered 2014-06-10 – 2014-06-13 (×4): 10 mg via ORAL
  Filled 2014-06-10 (×5): qty 1

## 2014-06-10 MED ORDER — FLUTICASONE PROPIONATE 50 MCG/ACT NA SUSP
1.0000 | Freq: Every day | NASAL | Status: DC
  Administered 2014-06-11 – 2014-06-14 (×4): 1 via NASAL
  Filled 2014-06-10 (×2): qty 16

## 2014-06-10 MED ORDER — STERILE WATER FOR INJECTION IJ SOLN
INTRAMUSCULAR | Status: AC
  Filled 2014-06-10: qty 20

## 2014-06-10 MED ORDER — PREDNISONE 20 MG PO TABS
20.0000 mg | ORAL_TABLET | Freq: Every day | ORAL | Status: DC
  Administered 2014-06-10 – 2014-06-14 (×5): 20 mg via ORAL
  Filled 2014-06-10 (×5): qty 1

## 2014-06-10 MED ORDER — SUCCINYLCHOLINE CHLORIDE 20 MG/ML IJ SOLN
INTRAMUSCULAR | Status: DC | PRN
  Administered 2014-06-10: 80 mg via INTRAVENOUS

## 2014-06-10 MED ORDER — NEOSTIGMINE METHYLSULFATE 10 MG/10ML IV SOLN
INTRAVENOUS | Status: DC | PRN
  Administered 2014-06-10: 5 mg via INTRAVENOUS

## 2014-06-10 MED ORDER — VECURONIUM BROMIDE 10 MG IV SOLR
INTRAVENOUS | Status: AC
  Filled 2014-06-10: qty 20

## 2014-06-10 MED ORDER — DEXAMETHASONE SODIUM PHOSPHATE 4 MG/ML IJ SOLN: INTRAMUSCULAR | Status: DC | PRN

## 2014-06-10 MED ORDER — DEXAMETHASONE SODIUM PHOSPHATE 4 MG/ML IJ SOLN
INTRAMUSCULAR | Status: AC
  Filled 2014-06-10: qty 1

## 2014-06-10 MED ORDER — LIDOCAINE HCL (CARDIAC) 20 MG/ML IV SOLN
INTRAVENOUS | Status: AC
  Filled 2014-06-10: qty 5

## 2014-06-10 MED ORDER — 0.9 % SODIUM CHLORIDE (POUR BTL) OPTIME
TOPICAL | Status: DC | PRN
  Administered 2014-06-10 (×2): 1000 mL

## 2014-06-10 MED ORDER — LACTATED RINGERS IV SOLN
INTRAVENOUS | Status: DC | PRN
  Administered 2014-06-10 (×2): via INTRAVENOUS

## 2014-06-10 MED ORDER — EPHEDRINE SULFATE 50 MG/ML IJ SOLN
INTRAMUSCULAR | Status: AC
  Filled 2014-06-10: qty 1

## 2014-06-10 MED ORDER — BUDESONIDE-FORMOTEROL FUMARATE 160-4.5 MCG/ACT IN AERO
2.0000 | INHALATION_SPRAY | Freq: Two times a day (BID) | RESPIRATORY_TRACT | Status: DC
  Administered 2014-06-10 – 2014-06-14 (×8): 2 via RESPIRATORY_TRACT
  Filled 2014-06-10 (×2): qty 6

## 2014-06-10 MED ORDER — LIDOCAINE HCL (CARDIAC) 20 MG/ML IV SOLN
INTRAVENOUS | Status: DC | PRN
Start: 2014-06-10 — End: 2014-06-10
  Administered 2014-06-10: 50 mg via INTRAVENOUS

## 2014-06-10 MED ORDER — FENTANYL 10 MCG/ML IV SOLN
INTRAVENOUS | Status: DC
Start: 2014-06-10 — End: 2014-06-12
  Administered 2014-06-10: 140 ug via INTRAVENOUS
  Administered 2014-06-10: via INTRAVENOUS
  Administered 2014-06-10: 160 ug via INTRAVENOUS
  Administered 2014-06-10: 11:00:00 via INTRAVENOUS
  Administered 2014-06-11 (×2): 170 ug via INTRAVENOUS
  Administered 2014-06-11: 30 ug via INTRAVENOUS
  Administered 2014-06-11: 50 ug via INTRAVENOUS
  Administered 2014-06-11: 20 ug via INTRAVENOUS
  Administered 2014-06-11: 140 ug via INTRAVENOUS
  Administered 2014-06-12: 11:00:00 via INTRAVENOUS
  Administered 2014-06-12 (×3): 10 ug via INTRAVENOUS
  Administered 2014-06-12: 20 ug via INTRAVENOUS
  Filled 2014-06-10 (×3): qty 50

## 2014-06-10 MED ORDER — NALOXONE HCL 0.4 MG/ML IJ SOLN: 0.4000 mg | INTRAMUSCULAR | Status: DC | PRN

## 2014-06-10 MED ORDER — NEOSTIGMINE METHYLSULFATE 10 MG/10ML IV SOLN
INTRAVENOUS | Status: AC
  Filled 2014-06-10: qty 1

## 2014-06-10 MED ORDER — ACETAMINOPHEN 500 MG PO TABS
1000.0000 mg | ORAL_TABLET | Freq: Four times a day (QID) | ORAL | Status: DC
  Administered 2014-06-10 – 2014-06-14 (×12): 1000 mg via ORAL
  Filled 2014-06-10 (×18): qty 2

## 2014-06-10 MED ORDER — LABETALOL HCL 200 MG PO TABS
200.0000 mg | ORAL_TABLET | Freq: Two times a day (BID) | ORAL | Status: DC
  Administered 2014-06-11 – 2014-06-14 (×7): 200 mg via ORAL
  Filled 2014-06-10 (×9): qty 1

## 2014-06-10 MED ORDER — CHLORHEXIDINE GLUCONATE 0.12 % MT SOLN
15.0000 mL | Freq: Two times a day (BID) | OROMUCOSAL | Status: DC
  Administered 2014-06-10 – 2014-06-11 (×2): 15 mL via OROMUCOSAL
  Filled 2014-06-10 (×2): qty 15

## 2014-06-10 MED ORDER — KCL IN DEXTROSE-NACL 20-5-0.45 MEQ/L-%-% IV SOLN
INTRAVENOUS | Status: DC
  Administered 2014-06-10: 100 mL/h via INTRAVENOUS
  Administered 2014-06-11: 02:00:00 via INTRAVENOUS
  Filled 2014-06-10 (×3): qty 1000

## 2014-06-10 MED ORDER — HEMOSTATIC AGENTS (NO CHARGE) OPTIME
TOPICAL | Status: DC | PRN
  Administered 2014-06-10: 1 via TOPICAL

## 2014-06-10 MED ORDER — ONDANSETRON HCL 4 MG/2ML IJ SOLN: 4.0000 mg | Freq: Four times a day (QID) | INTRAMUSCULAR | Status: DC | PRN

## 2014-06-10 MED ORDER — ALBUTEROL SULFATE HFA 108 (90 BASE) MCG/ACT IN AERS
INHALATION_SPRAY | RESPIRATORY_TRACT | Status: DC | PRN
  Administered 2014-06-10 (×2): 4 via RESPIRATORY_TRACT

## 2014-06-10 SURGICAL SUPPLY — 68 items
BAG DECANTER FOR FLEXI CONT (MISCELLANEOUS) IMPLANT
BLADE SURG 11 STRL SS (BLADE) ×4 IMPLANT
CANISTER SUCTION 2500CC (MISCELLANEOUS) ×4 IMPLANT
CATH KIT ON Q 5IN SLV (PAIN MANAGEMENT) IMPLANT
CATH ROBINSON RED A/P 22FR (CATHETERS) IMPLANT
CATH THORACIC 28FR (CATHETERS) ×4 IMPLANT
CATH THORACIC 36FR (CATHETERS) IMPLANT
CATH THORACIC 36FR RT ANG (CATHETERS) IMPLANT
CONT SPEC 4OZ CLIKSEAL STRL BL (MISCELLANEOUS) ×16 IMPLANT
COVER SURGICAL LIGHT HANDLE (MISCELLANEOUS) ×8 IMPLANT
DERMABOND ADVANCED (GAUZE/BANDAGES/DRESSINGS) ×2
DERMABOND ADVANCED .7 DNX12 (GAUZE/BANDAGES/DRESSINGS) ×2 IMPLANT
DRAPE LAPAROSCOPIC ABDOMINAL (DRAPES) ×4 IMPLANT
DRAPE WARM FLUID 44X44 (DRAPE) ×4 IMPLANT
ELECT BLADE 4.0 EZ CLEAN MEGAD (MISCELLANEOUS) ×4
ELECT REM PT RETURN 9FT ADLT (ELECTROSURGICAL) ×4
ELECTRODE BLDE 4.0 EZ CLN MEGD (MISCELLANEOUS) ×2 IMPLANT
ELECTRODE REM PT RTRN 9FT ADLT (ELECTROSURGICAL) ×2 IMPLANT
EXTRA LONG COSEAL TIP ×8 IMPLANT
GAUZE SPONGE 4X4 12PLY STRL (GAUZE/BANDAGES/DRESSINGS) ×4 IMPLANT
GAUZE SPONGE 4X4 16PLY NS LF (WOUND CARE) ×4 IMPLANT
GLOVE BIO SURGEON STRL SZ7.5 (GLOVE) ×8 IMPLANT
GOWN STRL REUS W/ TWL LRG LVL3 (GOWN DISPOSABLE) ×6 IMPLANT
GOWN STRL REUS W/TWL LRG LVL3 (GOWN DISPOSABLE) ×6
HANDLE UNIV ENDO GIA (ENDOMECHANICALS) ×4 IMPLANT
KIT BASIN OR (CUSTOM PROCEDURE TRAY) ×4 IMPLANT
KIT ROOM TURNOVER OR (KITS) ×4 IMPLANT
KIT SUCTION CATH 14FR (SUCTIONS) ×8 IMPLANT
MEGADYNE E-Z CLEAN ×4 IMPLANT
NS IRRIG 1000ML POUR BTL (IV SOLUTION) ×8 IMPLANT
PACK CHEST (CUSTOM PROCEDURE TRAY) ×4 IMPLANT
PAD ARMBOARD 7.5X6 YLW CONV (MISCELLANEOUS) ×8 IMPLANT
RELOAD EGIA 45 MED/THCK PURPLE (STAPLE) ×12 IMPLANT
SEALANT SURG COSEAL 4ML (VASCULAR PRODUCTS) ×4 IMPLANT
SOLUTION ANTI FOG 6CC (MISCELLANEOUS) ×4 IMPLANT
SPONGE GAUZE 4X4 12PLY STER LF (GAUZE/BANDAGES/DRESSINGS) ×4 IMPLANT
SPONGE TONSIL 1 RF SGL (DISPOSABLE) ×4 IMPLANT
SUT CHROMIC 3 0 SH 27 (SUTURE) IMPLANT
SUT ETHILON 3 0 PS 1 (SUTURE) IMPLANT
SUT PROLENE 3 0 SH DA (SUTURE) IMPLANT
SUT PROLENE 4 0 RB 1 (SUTURE)
SUT PROLENE 4-0 RB1 .5 CRCL 36 (SUTURE) IMPLANT
SUT SILK  1 MH (SUTURE) ×4
SUT SILK 1 MH (SUTURE) ×4 IMPLANT
SUT SILK 2 0SH CR/8 30 (SUTURE) IMPLANT
SUT SILK 3 0SH CR/8 30 (SUTURE) IMPLANT
SUT VIC AB 1 CTX 18 (SUTURE) IMPLANT
SUT VIC AB 2 TP1 27 (SUTURE) IMPLANT
SUT VIC AB 2-0 CT1 27 (SUTURE) ×2
SUT VIC AB 2-0 CT1 TAPERPNT 27 (SUTURE) ×2 IMPLANT
SUT VIC AB 2-0 CT2 18 VCP726D (SUTURE) ×8 IMPLANT
SUT VIC AB 2-0 CTX 36 (SUTURE) IMPLANT
SUT VIC AB 2-0 UR6 27 (SUTURE) ×4 IMPLANT
SUT VIC AB 3-0 SH 18 (SUTURE) IMPLANT
SUT VIC AB 3-0 X1 27 (SUTURE) ×8 IMPLANT
SUT VICRYL 0 UR6 27IN ABS (SUTURE) ×8 IMPLANT
SUT VICRYL 2 TP 1 (SUTURE) IMPLANT
SWAB COLLECTION DEVICE MRSA (MISCELLANEOUS) IMPLANT
SYSTEM SAHARA CHEST DRAIN ATS (WOUND CARE) ×4 IMPLANT
TAPE CLOTH SURG 4X10 WHT LF (GAUZE/BANDAGES/DRESSINGS) ×4 IMPLANT
TIP APPLICATOR SPRAY EXTEND 16 (VASCULAR PRODUCTS) IMPLANT
TOWEL OR 17X24 6PK STRL BLUE (TOWEL DISPOSABLE) ×4 IMPLANT
TOWEL OR 17X26 10 PK STRL BLUE (TOWEL DISPOSABLE) ×8 IMPLANT
TRAP SPECIMEN MUCOUS 40CC (MISCELLANEOUS) IMPLANT
TRAY FOLEY CATH 16FRSI W/METER (SET/KITS/TRAYS/PACK) ×4 IMPLANT
TROCAR BLADELESS 11MM (ENDOMECHANICALS) ×8 IMPLANT
TUBE ANAEROBIC SPECIMEN COL (MISCELLANEOUS) IMPLANT
WATER STERILE IRR 1000ML POUR (IV SOLUTION) ×8 IMPLANT

## 2014-06-10 NOTE — Anesthesia Procedure Notes (Signed)
Procedure Name: Intubation Performed by: Clearnce Sorrel Pre-anesthesia Checklist: Patient identified, Timeout performed, Emergency Drugs available, Suction available and Patient being monitored Patient Re-evaluated:Patient Re-evaluated prior to inductionOxygen Delivery Method: Circle system utilized Preoxygenation: Pre-oxygenation with 100% oxygen Intubation Type: IV induction Ventilation: Mask ventilation without difficulty Laryngoscope Size: Mac, 3 and Glidescope Grade View: Grade II Tube type: Oral Endobronchial tube: Left, EBT position confirmed by auscultation and EBT position confirmed by fiberoptic bronchoscope and 37 Fr Number of attempts: 1 Airway Equipment and Method: Fiberoptic brochoscope and Video-laryngoscopy Placement Confirmation: ETT inserted through vocal cords under direct vision,  positive ETCO2,  CO2 detector and breath sounds checked- equal and bilateral Secured at: 29 cm Tube secured with: Tape

## 2014-06-10 NOTE — Anesthesia Preprocedure Evaluation (Addendum)
Anesthesia Evaluation  Patient identified by MRN, date of birth, ID band Patient awake    Reviewed: Allergy & Precautions, NPO status , Patient's Chart, lab work & pertinent test results, reviewed documented beta blocker date and time   History of Anesthesia Complications Negative for: history of anesthetic complications  Airway Mallampati: III  TM Distance: >3 FB Neck ROM: Full    Dental  (+)    Pulmonary shortness of breath and with exertion, asthma , sleep apnea and Continuous Positive Airway Pressure Ventilation , former smoker,    + decreased breath sounds      Cardiovascular hypertension, Pt. on medications - angina- Past MI and - CHF Rhythm:Regular     Neuro/Psych Anxiety Depression negative neurological ROS     GI/Hepatic Neg liver ROS, GERD-  ,  Endo/Other  diabetes, Type 2Morbid obesity  Renal/GU Renal InsufficiencyRenal disease     Musculoskeletal  (+) Arthritis -,   Abdominal   Peds  Hematology negative hematology ROS (+)   Anesthesia Other Findings   Reproductive/Obstetrics                            Anesthesia Physical Anesthesia Plan  ASA: III  Anesthesia Plan: General   Post-op Pain Management:    Induction: Intravenous  Airway Management Planned: Double Lumen EBT  Additional Equipment: Arterial line and Ultrasound Guidance Line Placement  Intra-op Plan:   Post-operative Plan: Extubation in OR  Informed Consent: I have reviewed the patients History and Physical, chart, labs and discussed the procedure including the risks, benefits and alternatives for the proposed anesthesia with the patient or authorized representative who has indicated his/her understanding and acceptance.   Dental advisory given  Plan Discussed with: Surgeon and CRNA  Anesthesia Plan Comments:        Anesthesia Quick Evaluation

## 2014-06-10 NOTE — Brief Op Note (Signed)
06/10/2014  9:55 AM  PATIENT:  Stacey Page  64 y.o. female  PRE-OPERATIVE DIAGNOSIS:  PULMONARY FIBROSIS  POST-OPERATIVE DIAGNOSIS:  PULMONARY FIBROSIS  PROCEDURE:   RIGHT VIDEO ASSISTED THORACOSCOPY  RIGHT UPPER AND MIDDLE LOBE LUNG BIOPSIES  SURGEON:  Surgeon(s): Ivin Poot, MD  ASSISTANT: Suzzanne Cloud, PA-C  ANESTHESIA:   general  SPECIMEN:  Source of Specimen:  right upper and middle lobes  DISPOSITION OF SPECIMEN:  Pathology  DRAINS: 28 Fr CT  PATIENT CONDITION:  PACU - hemodynamically stable.

## 2014-06-10 NOTE — Op Note (Signed)
NAMECATLIN, Stacey Page NO.:  [id number]  MEDICAL RECORD NO.:  [id number]  LOCATION:  2S03C                        FACILITY:  [facility]  PHYSICIAN:  Ivin Poot, M.D.  DATE OF BIRTH:  1950-09-30  DATE OF PROCEDURE:  06/10/2014 DATE OF DISCHARGE:                              OPERATIVE REPORT   OPERATION:  Right VATS (video-assisted thoracoscopic surgery) with right lung biopsies.  SURGEON:  Ivin Poot, M.D.  ASSISTANT:  Suzzanne Cloud, PA-C  ANESTHESIA:  General by Dr. Laurie Panda.  PREOPERATIVE DIAGNOSIS:  Interstitial lung disease by high definition CT scan with increasing shortness of breath.  POSTOPERATIVE DIAGNOSIS:  Interstitial lung disease by high definition CT scan with increasing shortness of breath.  CLINICAL NOTE:  The patient is a 64 year old obese diabetic reformed smoker who has had complaints of increasing shortness of breath and decreased exercise tolerance.  She has been carefully evaluated by Dr. Halford Chessman.  PFTs, echocardiogram, and high definition CT scan were performed. This demonstrated interstitial lung disease pattern.  Echocardiogram showed fairly good biventricular function and PFTs were moderately reduced with DLCO of 52%.  The patient was started on prednisone and improved symptomatically.  Before the patient was committed to long-term prednisone therapy, her pulmonologist, Dr. Halford Chessman felt that open lung biopsy would be important to document the pathology of the interstitial lung disease.  I thought this was an appropriate plan and discussed the procedure in detail with the patient and her family.  I discussed the use of general anesthesia, the location of the surgical incisions, use of a postoperative chest tube drainage system, and expected postoperative recovery.  She understood that she would be at high risk due to her morbid obesity and history of previous smoking and sleep apnea.  She understood the specific risks of bleeding,  prolonged air leak, pneumonia, prolonged ventilator dependence, wound infection, and death.  She agreed to proceed with surgery under what I felt was an informed consent.  OPERATIVE PROCEDURE:  The patient was brought to the operating room placed supine on the operating room table, where general anesthesia was induced.  A double-lumen endotracheal tube was positioned by the anesthesiology team.  A Foley catheter was placed and the patient was turned to expose the right side up.  The patient's right chest was prepped and draped as a sterile field.  A proper time-out was performed. Three small VATS portal incisions were made beneath the tip of the scapula along the posterior aspect of the mid scapula and the anterior axillary line in the fifth interspace.  The camera was inserted.  The lung had some adhesions diffusely.  The middle lobe was partially adherent as was the lower lobe to the parietal pleura of the chest wall. The adhesions were carefully mobilized in order to free up the middle lobe and lower lobe access for the biopsies.  The Endo-GIA stapling device was then used to take 2 adequate sections of the middle lobe and the upper lobe.  These were placed in saline and submitted for permanent histology.  The staple lines were covered with a fine layer of medical adhesive-Co seal.  A 28-French chest tube was placed through a separate incision and directed  to the apex and secured to the skin.  The chest tube was connected to an underwater seal Pleur-Evac drainage system after the chest was closed.  The right lung was then re-expanded under direct vision.  It expanded well.  The 3 incisions were then closed in layers using Vicryl.  Sterile dressings were applied.  The patient was rolled supine.  The plan was to extubate the patient, then returned to recovery room for careful monitoring and postoperative care.     Ivin Poot, M.D.     PV/MEDQ  D:  06/10/2014  T:   06/10/2014  Job:  [id number]  cc:   Chesley Mires, MD

## 2014-06-10 NOTE — Progress Notes (Signed)
The patient was examined and preop studies reviewed. There has been no change from the prior exam and the patient is ready for surgery. Plan lung bx on GR today

## 2014-06-10 NOTE — Progress Notes (Signed)
Patient ID: Stacey Page, female   DOB: 1950/05/23, 64 y.o.   MRN: [id number]   SICU Evening Rounds:   Hemodynamically stable    Urine output good  CT output low  CBC    Component Value Date/Time   WBC 8.3 06/06/2014 1417   RBC 3.81* 06/06/2014 1417   HGB 12.6 06/10/2014 0622   HCT 37.0 06/10/2014 0622   PLT 233 06/06/2014 1417   MCV 90.8 06/06/2014 1417   MCH 29.1 06/06/2014 1417   MCHC 32.1 06/06/2014 1417   RDW 16.0* 06/06/2014 1417   LYMPHSABS 1.3 03/27/2014 0809   MONOABS 0.3 03/27/2014 0809   EOSABS 0.1 03/27/2014 0809   BASOSABS 0.1 03/27/2014 0809     BMET    Component Value Date/Time   NA 137 06/10/2014 0622   K 4.2 06/10/2014 0622   CL 105 06/10/2014 0622   CO2 23 06/06/2014 1417   GLUCOSE 113* 06/10/2014 0622   BUN 35* 06/10/2014 0622   CREATININE 2.00* 06/10/2014 0622   CALCIUM 9.8 06/06/2014 1417   GFRNONAA 26* 06/06/2014 1417   GFRAA 30* 06/06/2014 1417     A/P:  Stable postop course. Continue current plans

## 2014-06-10 NOTE — Transfer of Care (Signed)
Immediate Anesthesia Transfer of Care Note  Patient: Stacey Page  Procedure(s) Performed: Procedure(s): VIDEO ASSISTED THORACOSCOPY (Right) LUNG BIOPSY (Right)  Patient Location: PACU  Anesthesia Type:General  Level of Consciousness: awake, alert  and oriented  Airway & Oxygen Therapy: Patient Spontanous Breathing and Patient connected to face mask oxygen  Post-op Assessment: Report given to RN and Post -op Vital signs reviewed and stable  Post vital signs: Reviewed and stable  Last Vitals:  Filed Vitals:   06/10/14 1045  BP: 104/33  Pulse: 61  Temp: 36.2 C  Resp: 23    Complications: No apparent anesthesia complications

## 2014-06-10 NOTE — Anesthesia Postprocedure Evaluation (Signed)
  Anesthesia Post-op Note  Patient: Stacey Page  Procedure(s) Performed: Procedure(s): VIDEO ASSISTED THORACOSCOPY (Right) LUNG BIOPSY (Right)  Patient Location: PACU  Anesthesia Type:General  Level of Consciousness: awake and alert   Airway and Oxygen Therapy: Patient Spontanous Breathing  Post-op Pain: mild  Post-op Assessment: Post-op Vital signs reviewed, Patient's Cardiovascular Status Stable, Respiratory Function Stable, Patent Airway, No signs of Nausea or vomiting and Pain level controlled  Post-op Vital Signs: Reviewed and stable  Last Vitals:  Filed Vitals:   06/10/14 1345  BP:   Pulse:   Temp: 36.3 C  Resp:     Complications: No apparent anesthesia complications

## 2014-06-11 ENCOUNTER — Inpatient Hospital Stay (HOSPITAL_COMMUNITY): Payer: PPO

## 2014-06-11 LAB — BLOOD GAS, ARTERIAL
ACID-BASE DEFICIT: 1.2 mmol/L (ref 0.0–2.0)
BICARBONATE: 24.5 meq/L — AB (ref 20.0–24.0)
DELIVERY SYSTEMS: POSITIVE
DRAWN BY: 398661
Expiratory PAP: 5
O2 Content: 3 L/min
O2 Saturation: 89 %
PCO2 ART: 52.4 mmHg — AB (ref 35.0–45.0)
PO2 ART: 60.9 mmHg — AB (ref 80.0–100.0)
Patient temperature: 98.6
TCO2: 26.1 mmol/L (ref 0–100)
pH, Arterial: 7.291 — ABNORMAL LOW (ref 7.350–7.450)

## 2014-06-11 LAB — CBC
HEMATOCRIT: 33 % — AB (ref 36.0–46.0)
Hemoglobin: 10.6 g/dL — ABNORMAL LOW (ref 12.0–15.0)
MCH: 29.5 pg (ref 26.0–34.0)
MCHC: 32.1 g/dL (ref 30.0–36.0)
MCV: 91.9 fL (ref 78.0–100.0)
PLATELETS: 217 10*3/uL (ref 150–400)
RBC: 3.59 MIL/uL — ABNORMAL LOW (ref 3.87–5.11)
RDW: 15.6 % — AB (ref 11.5–15.5)
WBC: 9.6 10*3/uL (ref 4.0–10.5)

## 2014-06-11 LAB — GLUCOSE, CAPILLARY
Glucose-Capillary: 122 mg/dL — ABNORMAL HIGH (ref 70–99)
Glucose-Capillary: 127 mg/dL — ABNORMAL HIGH (ref 70–99)
Glucose-Capillary: 128 mg/dL — ABNORMAL HIGH (ref 70–99)
Glucose-Capillary: 136 mg/dL — ABNORMAL HIGH (ref 70–99)
Glucose-Capillary: 138 mg/dL — ABNORMAL HIGH (ref 70–99)
Glucose-Capillary: 140 mg/dL — ABNORMAL HIGH (ref 70–99)

## 2014-06-11 LAB — BASIC METABOLIC PANEL
ANION GAP: 7 (ref 5–15)
BUN: 20 mg/dL (ref 6–20)
CO2: 23 mmol/L (ref 22–32)
CREATININE: 1.32 mg/dL — AB (ref 0.44–1.00)
Calcium: 8.7 mg/dL — ABNORMAL LOW (ref 8.9–10.3)
Chloride: 103 mmol/L (ref 101–111)
GFR calc non Af Amer: 42 mL/min — ABNORMAL LOW (ref >60)
GFR, EST AFRICAN AMERICAN: 49 mL/min — AB (ref >60)
Glucose, Bld: 161 mg/dL — ABNORMAL HIGH (ref 70–99)
Potassium: 5.2 mmol/L — ABNORMAL HIGH (ref 3.5–5.1)
SODIUM: 133 mmol/L — AB (ref 135–145)

## 2014-06-11 MED ORDER — FUROSEMIDE 40 MG PO TABS
40.0000 mg | ORAL_TABLET | Freq: Every day | ORAL | Status: DC
  Administered 2014-06-11 – 2014-06-14 (×4): 40 mg via ORAL
  Filled 2014-06-11 (×5): qty 1

## 2014-06-11 MED ORDER — CETYLPYRIDINIUM CHLORIDE 0.05 % MT LIQD
7.0000 mL | Freq: Two times a day (BID) | OROMUCOSAL | Status: DC
  Administered 2014-06-12 – 2014-06-14 (×4): 7 mL via OROMUCOSAL

## 2014-06-11 MED ORDER — LISINOPRIL 40 MG PO TABS
40.0000 mg | ORAL_TABLET | Freq: Every day | ORAL | Status: DC
  Administered 2014-06-12 – 2014-06-13 (×2): 40 mg via ORAL
  Filled 2014-06-11 (×3): qty 1

## 2014-06-11 MED ORDER — ENOXAPARIN SODIUM 40 MG/0.4ML ~~LOC~~ SOLN
40.0000 mg | SUBCUTANEOUS | Status: DC
  Administered 2014-06-11 – 2014-06-14 (×4): 40 mg via SUBCUTANEOUS
  Filled 2014-06-11 (×4): qty 0.4

## 2014-06-11 MED ORDER — DEXTROSE-NACL 5-0.45 % IV SOLN
INTRAVENOUS | Status: DC
  Administered 2014-06-11: 20 mL via INTRAVENOUS

## 2014-06-11 NOTE — Progress Notes (Addendum)
TCTS DAILY ICU PROGRESS NOTE                   [address]            [place] [address]          [phone]   1 Day Post-Op Procedure(s) (LRB): VIDEO ASSISTED THORACOSCOPY (Right) LUNG BIOPSY (Right)  Total Length of Stay:  LOS: 1 day   Subjective: OOB in chair.  Feeling sore, but breathing stable. Mild cough, no nausea.   Objective: Vital signs in last 24 hours: Temp:  [97.2 F (36.2 C)-98.9 F (37.2 C)] 98.9 F (37.2 C) (05/04 0359) Pulse Rate:  [57-76] 70 (05/04 0600) Cardiac Rhythm:  [-] Normal sinus rhythm (05/04 0400) Resp:  [13-25] 25 (05/04 0700) BP: (93-156)/(22-73) 144/72 mmHg (05/04 0700) SpO2:  [93 %-100 %] 97 % (05/04 0700) Arterial Line BP: (86-186)/(39-75) 175/69 mmHg (05/04 0600) Weight:  [345 lb (156.491 kg)] 345 lb (156.491 kg) (05/03 1700)  Filed Weights   06/10/14 0549 06/10/14 1700  Weight: 345 lb (156.491 kg) 345 lb (156.491 kg)    Weight change: 0 lb (0 kg)   Hemodynamic parameters for last 24 hours:    Intake/Output from previous day: 05/03 0701 - 05/04 0700 In: 2396.7 [P.O.:100; I.V.:2246.7; IV Piggyback:50] Out: 2445 [Urine:2195; Chest Tube:250]  CBGs 123-123-128-138-161     Current Meds: Scheduled Meds: . acetaminophen  1,000 mg Oral 4 times per day   Or  . acetaminophen (TYLENOL) oral liquid 160 mg/5 mL  1,000 mg Oral 4 times per day  . antiseptic oral rinse  7 mL Mouth Rinse q12n4p  . bisacodyl  10 mg Oral Daily  . budesonide-formoterol  2 puff Inhalation BID  . cefUROXime (ZINACEF)  IV  1.5 g Intravenous Q12H  . chlorhexidine  15 mL Mouth Rinse BID  . fentaNYL   Intravenous 6 times per day  . fluticasone  1 spray Each [name] Daily  . insulin aspart  0-24 Units Subcutaneous 6 times per day  . labetalol  200 mg Oral BID  . linagliptin  5 mg Oral Daily  . lisinopril  40 mg Oral Daily  . montelukast  10 mg Oral QHS  . predniSONE  20 mg Oral Daily  . senna-docusate  1 tablet Oral QHS   Continuous  Infusions: . dextrose 5 % and 0.45 % NaCl with KCl 20 mEq/L 100 mL/hr at 06/11/14 0400   PRN Meds:.albuterol, diphenhydrAMINE **OR** diphenhydrAMINE, naloxone **AND** sodium chloride, ondansetron (ZOFRAN) IV, oxyCODONE, potassium chloride, traMADol  Physical Exam: General appearance: alert, cooperative and no distress Heart: regular rate and rhythm Lungs: Clear, no wheezing Wound: Dressed and dry Chest tube: No air leak  Lab Results: CBC: Recent Labs  06/10/14 0622 06/11/14 0413  WBC  --  9.6  HGB 12.6 10.6*  HCT 37.0 33.0*  PLT  --  217   BMET:  Recent Labs  06/10/14 0622 06/11/14 0413  NA 137 133*  K 4.2 5.2*  CL 105 103  CO2  --  23  GLUCOSE 113* 161*  BUN 35* 20  CREATININE 2.00* 1.32*  CALCIUM  --  8.7*    PT/INR: No results for input(s): LABPROT, INR in the last 72 hours. Radiology: Dg Chest Port 1 View  06/11/2014   CLINICAL DATA:  Status post VATS on the right with biopsy  EXAM: PORTABLE CHEST - 1 VIEW   FINDINGS: Interim extubation. Right chest tube in stable position. Persistent severe cardiomegaly.  Low lung volumes with bilateral mid lung and bibasilar atelectasis. Small right pleural effusion cannot be excluded. No pneumothorax .  IMPRESSION: 1. Interim extubation. 2. Severe cardiomegaly. No prominent pulmonary venous congestion. 3. Persistent bilateral mid lung and bibasilar subsegmental atelectasis. Small right pleural effusion cannot be excluded.   Assessment/Plan: S/P Procedure(s) (LRB): VIDEO ASSISTED THORACOSCOPY (Right) LUNG BIOPSY (Right) CXR stable, CT with minimal output and no air leak.  Possibly can decrease CT to water seal. Pulm/ILD- Continue pulm toilet, resume home nebs,po steroids, etc. Wean O2 as able. DM- sugars stable. Restart po meds as po intake improves, continue SSI. HTN- BPs trending up, will resume Labetalol, hold ACE-I for now. CKD- Cr below preop (1.5 prior to admission). Continue to watch. UOP good- not back on home  Lasix yet.   Hyperkalemia-  Will watch. Decrease IVF, d/c a-line, leave Foley one more day for volume measurement.   [place] H 06/11/2014 7:44 AM  Plan transfer to 3 S Leave chest tube to water seal patient examined and medical record reviewed,agree with above note. Tharon Aquas Trigt III 06/11/2014

## 2014-06-11 NOTE — Progress Notes (Signed)
      [address]       [place], [address]             [phone]      POD # 1 lung biopsy  Resting comfortably  CT to water seal  BP 129/76 mmHg  Pulse 65  Temp(Src) 99 F (37.2 C) (Axillary)  Resp 19  Ht 5\' 8"  (1.727 m)  Wt 345 lb (156.491 kg)  BMI 52.47 kg/m2  SpO2 95%   Intake/Output Summary (Last 24 hours) at 06/11/14 1905 Last data filed at 06/11/14 1800  Gross per 24 hour  Intake   2187 ml  Output   2046 ml  Net    141 ml   CBG well controlled  Awaiting bed on 3 S  Illana Nolting C. Roxan Hockey, MD [facility] [phone]

## 2014-06-12 ENCOUNTER — Inpatient Hospital Stay (HOSPITAL_COMMUNITY): Payer: PPO

## 2014-06-12 LAB — COMPREHENSIVE METABOLIC PANEL
ALBUMIN: 3.1 g/dL — AB (ref 3.5–5.0)
ALK PHOS: 48 U/L (ref 38–126)
ALT: 21 U/L (ref 14–54)
AST: 18 U/L (ref 15–41)
Anion gap: 7 (ref 5–15)
BUN: 14 mg/dL (ref 6–20)
CO2: 27 mmol/L (ref 22–32)
Calcium: 9 mg/dL (ref 8.9–10.3)
Chloride: 100 mmol/L — ABNORMAL LOW (ref 101–111)
Creatinine, Ser: 1.36 mg/dL — ABNORMAL HIGH (ref 0.44–1.00)
GFR calc Af Amer: 47 mL/min — ABNORMAL LOW (ref >60)
GFR calc non Af Amer: 41 mL/min — ABNORMAL LOW (ref >60)
Glucose, Bld: 117 mg/dL — ABNORMAL HIGH (ref 70–99)
Potassium: 5.3 mmol/L — ABNORMAL HIGH (ref 3.5–5.1)
SODIUM: 134 mmol/L — AB (ref 135–145)
TOTAL PROTEIN: 6.6 g/dL (ref 6.5–8.1)
Total Bilirubin: 0.4 mg/dL (ref 0.3–1.2)

## 2014-06-12 LAB — GLUCOSE, CAPILLARY
Glucose-Capillary: 111 mg/dL — ABNORMAL HIGH (ref 70–99)
Glucose-Capillary: 112 mg/dL — ABNORMAL HIGH (ref 70–99)
Glucose-Capillary: 117 mg/dL — ABNORMAL HIGH (ref 70–99)
Glucose-Capillary: 123 mg/dL — ABNORMAL HIGH (ref 70–99)
Glucose-Capillary: 150 mg/dL — ABNORMAL HIGH (ref 70–99)
Glucose-Capillary: 154 mg/dL — ABNORMAL HIGH (ref 70–99)

## 2014-06-12 LAB — CBC
HCT: 32.7 % — ABNORMAL LOW (ref 36.0–46.0)
Hemoglobin: 10.7 g/dL — ABNORMAL LOW (ref 12.0–15.0)
MCH: 29.9 pg (ref 26.0–34.0)
MCHC: 32.7 g/dL (ref 30.0–36.0)
MCV: 91.3 fL (ref 78.0–100.0)
PLATELETS: 213 10*3/uL (ref 150–400)
RBC: 3.58 MIL/uL — ABNORMAL LOW (ref 3.87–5.11)
RDW: 15.4 % (ref 11.5–15.5)
WBC: 9.6 10*3/uL (ref 4.0–10.5)

## 2014-06-12 NOTE — Progress Notes (Signed)
RT set up cpap for pt and pt stated she would place self on cpap when ready. RT informed pt to call for RT if she needs any help during the night

## 2014-06-12 NOTE — Progress Notes (Signed)
Utilization Review Completed.  

## 2014-06-12 NOTE — Progress Notes (Addendum)
TCTS DAILY ICU PROGRESS NOTE                   [address]            [place] [address]          [phone]   2 Days Post-Op Procedure(s) (LRB): VIDEO ASSISTED THORACOSCOPY (Right) LUNG BIOPSY (Right)  Total Length of Stay:  LOS: 2 days   Subjective: Comfortable, no complaints.  Walked yesterday, appetite improving, breathing stable.   Objective: Vital signs in last 24 hours: Temp:  [98.3 F (36.8 C)-99 F (37.2 C)] 98.5 F (36.9 C) (05/05 0342) Pulse Rate:  [56-70] 60 (05/05 0700) Cardiac Rhythm:  [-] Normal sinus rhythm (05/05 0700) Resp:  [10-24] 20 (05/05 0700) BP: (103-154)/(46-76) 123/56 mmHg (05/05 0600) SpO2:  [93 %-100 %] 94 % (05/05 0700) Arterial Line BP: (106-147)/(65-92) 147/65 mmHg (05/04 0833)  Filed Weights   06/10/14 0549 06/10/14 1700  Weight: 345 lb (156.491 kg) 345 lb (156.491 kg)    Weight change:    CBGs 136-128-117-111     Intake/Output from previous day: 05/04 0701 - 05/05 0700 In: 1132 [P.O.:600; I.V.:532] Out: 1406 [Urine:1345; Emesis/NG output:1; Chest Tube:60]  Intake/Output this shift:    Current Meds: Scheduled Meds: . acetaminophen  1,000 mg Oral 4 times per day   Or  . acetaminophen (TYLENOL) oral liquid 160 mg/5 mL  1,000 mg Oral 4 times per day  . antiseptic oral rinse  7 mL Mouth Rinse BID  . bisacodyl  10 mg Oral Daily  . budesonide-formoterol  2 puff Inhalation BID  . enoxaparin (LOVENOX) injection  40 mg Subcutaneous Q24H  . fentaNYL   Intravenous 6 times per day  . fluticasone  1 spray Each [name] Daily  . furosemide  40 mg Oral Daily  . insulin aspart  0-24 Units Subcutaneous 6 times per day  . labetalol  200 mg Oral BID  . linagliptin  5 mg Oral Daily  . lisinopril  40 mg Oral Daily  . montelukast  10 mg Oral QHS  . predniSONE  20 mg Oral Daily  . senna-docusate  1 tablet Oral QHS   Continuous Infusions: . dextrose 5 % and 0.45% NaCl 20 mL (06/11/14 2004)   PRN Meds:.albuterol,  diphenhydrAMINE **OR** diphenhydrAMINE, naloxone **AND** sodium chloride, ondansetron (ZOFRAN) IV, oxyCODONE, potassium chloride, traMADol   Physical Exam: General appearance: alert, cooperative and no distress Heart: regular rate and rhythm Lungs: diminished breath sounds bibasilar Wound: Clean and dry Chest tube: No air leak    Lab Results: CBC: Recent Labs  06/11/14 0413 06/12/14 0231  WBC 9.6 9.6  HGB 10.6* 10.7*  HCT 33.0* 32.7*  PLT 217 213   BMET:  Recent Labs  06/11/14 0413 06/12/14 0231  NA 133* 134*  K 5.2* 5.3*  CL 103 100*  CO2 23 27  GLUCOSE 161* 117*  BUN 20 14  CREATININE 1.32* 1.36*  CALCIUM 8.7* 9.0    PT/INR: No results for input(s): LABPROT, INR in the last 72 hours. Radiology: Dg Chest Port 1 View  FINDINGS: The right chest tube is unchanged in position. There is no pneumothorax. There is moderate unchanged cardiomegaly. Mildly prominent venous congestion persists without significant interval change.  IMPRESSION: No pneumothorax. Persistent venous congestion. Unchanged cardiomegaly.   Assessment/Plan: S/P Procedure(s) (LRB): VIDEO ASSISTED THORACOSCOPY (Right) LUNG BIOPSY (Right)  CXR stable, CT with minimal output and no air leak.Hopefully can d/c CT today.  Pulm/ILD- Continue pulm  toilet, resume home nebs, po steroids, etc. Down to 1L O2- continue to wean as able.  DM- sugars stable on home meds, SSI.  HTN- BPs improved, continue Labetalol, Lisinopril.  CKD- Cr stable,below preop (1.5 prior to admission).   Hyperkalemia- Not receiving any supplemental K. Watch.  Will KVO fluids, d/c once CT out and PCA can be d/c'ed.  D/c Foley, awaiting tx to stepdown.  Path pending.  COLLINS,GINA H 06/12/2014 7:46 AM   Agree with above assessment and plan DC chest tube and transfer to stepdown 2W or 3- S

## 2014-06-13 ENCOUNTER — Inpatient Hospital Stay (HOSPITAL_COMMUNITY): Payer: PPO

## 2014-06-13 LAB — BASIC METABOLIC PANEL WITH GFR
Anion gap: 9 (ref 5–15)
BUN: 27 mg/dL — ABNORMAL HIGH (ref 6–20)
CO2: 26 mmol/L (ref 22–32)
Calcium: 9.1 mg/dL (ref 8.9–10.3)
Chloride: 100 mmol/L — ABNORMAL LOW (ref 101–111)
Creatinine, Ser: 2.06 mg/dL — ABNORMAL HIGH (ref 0.44–1.00)
GFR calc Af Amer: 29 mL/min — ABNORMAL LOW
GFR calc non Af Amer: 25 mL/min — ABNORMAL LOW
Glucose, Bld: 106 mg/dL — ABNORMAL HIGH (ref 70–99)
Potassium: 4.7 mmol/L (ref 3.5–5.1)
Sodium: 135 mmol/L (ref 135–145)

## 2014-06-13 LAB — GLUCOSE, CAPILLARY
Glucose-Capillary: 105 mg/dL — ABNORMAL HIGH (ref 70–99)
Glucose-Capillary: 81 mg/dL (ref 70–99)
Glucose-Capillary: 100 mg/dL — ABNORMAL HIGH (ref 70–99)
Glucose-Capillary: 199 mg/dL — ABNORMAL HIGH (ref 70–99)
Glucose-Capillary: 143 mg/dL — ABNORMAL HIGH (ref 70–99)

## 2014-06-13 NOTE — Progress Notes (Signed)
Medicare Important Message given? YES  (If response is "NO", the following Medicare IM given date fields will be blank)  Date Medicare IM given: 06/13/14 Medicare IM given by:  Mckinley Olheiser  

## 2014-06-13 NOTE — Progress Notes (Deleted)
Utilization review completed.  

## 2014-06-13 NOTE — Progress Notes (Signed)
Patient stated that she places herself on and off CPAP at night. CPAP in room is hospital machine and tubing but patient nasal pillow. CPAP settings is 5 cmH2O. Water chamber was filled. RT made pt aware that if she needed any assistance tonight with machine to call.

## 2014-06-13 NOTE — Progress Notes (Addendum)
      [address]       [place] [address]             [phone]      3 Days Post-Op Procedure(s) (LRB): VIDEO ASSISTED THORACOSCOPY (Right) LUNG BIOPSY (Right)   Subjective:  [name] has no complaints this morning. States she is feeling pretty good.   Objective: Vital signs in last 24 hours: Temp:  [97.5 F (36.4 C)-98.8 F (37.1 C)] 97.9 F (36.6 C) (05/06 0741) Pulse Rate:  [58-83] 58 (05/06 0741) Cardiac Rhythm:  [-] Sinus bradycardia (05/06 0828) Resp:  [13-28] 25 (05/06 0741) BP: (93-166)/(25-68) 131/64 mmHg (05/06 0741) SpO2:  [88 %-100 %] 100 % (05/06 0827) Weight:  [359 lb 12.7 oz (163.2 kg)] 359 lb 12.7 oz (163.2 kg) (05/06 0653)  Intake/Output from previous day: 05/05 0701 - 05/06 0700 In: 1020 [P.O.:890; I.V.:130] Out: 395 [Urine:375; Chest Tube:20]  General appearance: alert, cooperative and no distress Heart: regular rate and rhythm Lungs: diminished breath sounds bibasilar Abdomen: soft, non-tender; bowel sounds normal; no masses,  no organomegaly Wound: clean and dry  Lab Results:  Recent Labs  06/11/14 0413 06/12/14 0231  WBC 9.6 9.6  HGB 10.6* 10.7*  HCT 33.0* 32.7*  PLT 217 213   BMET:  Recent Labs  06/12/14 0231 06/13/14 0324  NA 134* 135  K 5.3* 4.7  CL 100* 100*  CO2 27 26  GLUCOSE 117* 106*  BUN 14 27*  CREATININE 1.36* 2.06*  CALCIUM 9.0 9.1    PT/INR: No results for input(s): LABPROT, INR in the last 72 hours. ABG    Component Value Date/Time   PHART 7.291* 06/11/2014 0500   HCO3 24.5* 06/11/2014 0500   TCO2 26.1 06/11/2014 0500   ACIDBASEDEF 1.2 06/11/2014 0500   O2SAT 89.0 06/11/2014 0500   CBG (last 3)   Recent Labs  06/12/14 2336 06/13/14 0348 06/13/14 0742  GLUCAP 154* 105* 81    Assessment/Plan: S/P Procedure(s) (LRB): VIDEO ASSISTED THORACOSCOPY (Right) LUNG BIOPSY (Right)  1. CV- Sinus Loletha Grayer, HTN improved- patient asymptomatic- continue home Labatelol and Lisinopril 2.  Pulm- wean oxygen as tolerated, continue IS 3. Renal- CKD, creatinine stable 4. DM- cbgs stable, continue home regimen 5. Dispo- patient doing well, will continue to wean oxygen, home this afternoon vs. Tomorrow if gets off oxygen   LOS: 3 days    Ellwood Handler 06/13/2014  Path pending CXR today reviewed Pt not on home O2- will wean off prior to DC home  patient examined and medical record reviewed,agree with above note. Tharon Aquas Trigt III 06/13/2014

## 2014-06-13 NOTE — Care Management Note (Signed)
Case Management Note  Patient Details  Name: Stacey Page MRN: [id number] Date of Birth: 03/04/1950  Subjective/Objective:   Pt admitted s/p VATS        Action/Plan: PTA pt lived at home- independent  Expected Discharge Date:  06/15/14               Expected Discharge Plan:  Home/Self Care  In-House Referral:     Discharge planning Services  CM Consult  Post Acute Care Choice:    Choice offered to:     DME Arranged:    DME Agency:     HH Arranged:    [place] Agency:     Status of Service:  In process, will continue to follow  Medicare Important Message Given: Yes Date Medicare IM Given:  06/13/14 Medicare IM give by:  Marvetta Gibbons Date Additional Medicare IM Given:    Additional Medicare Important Message give by:     If discussed at [place] of Stay Meetings, dates discussed:    Additional Comments:  Dawayne Patricia, RN 06/13/2014, 11:19 AM

## 2014-06-14 LAB — BASIC METABOLIC PANEL
Anion gap: 6 (ref 5–15)
BUN: 28 mg/dL — ABNORMAL HIGH (ref 6–20)
CO2: 29 mmol/L (ref 22–32)
Calcium: 8.9 mg/dL (ref 8.9–10.3)
Chloride: 100 mmol/L — ABNORMAL LOW (ref 101–111)
Creatinine, Ser: 1.48 mg/dL — ABNORMAL HIGH (ref 0.44–1.00)
GFR calc Af Amer: 43 mL/min — ABNORMAL LOW (ref >60)
GFR calc non Af Amer: 37 mL/min — ABNORMAL LOW (ref >60)
Glucose, Bld: 100 mg/dL — ABNORMAL HIGH (ref 70–99)
Potassium: 4.3 mmol/L (ref 3.5–5.1)
Sodium: 135 mmol/L (ref 135–145)

## 2014-06-14 LAB — GLUCOSE, CAPILLARY
Glucose-Capillary: 132 mg/dL — ABNORMAL HIGH (ref 70–99)
Glucose-Capillary: 97 mg/dL (ref 70–99)
Glucose-Capillary: 85 mg/dL (ref 70–99)
Glucose-Capillary: 138 mg/dL — ABNORMAL HIGH (ref 70–99)

## 2014-06-14 MED ORDER — LISINOPRIL 20 MG PO TABS: 20.0000 mg | ORAL_TABLET | Freq: Every day | ORAL | Status: DC

## 2014-06-14 MED ORDER — OXYCODONE HCL 5 MG PO TABS: 5.0000 mg | ORAL_TABLET | ORAL | Status: DC | PRN

## 2014-06-14 MED ORDER — LISINOPRIL 20 MG PO TABS
20.0000 mg | ORAL_TABLET | Freq: Every day | ORAL | Status: DC
  Administered 2014-06-14: 20 mg via ORAL
  Filled 2014-06-14: qty 1

## 2014-06-14 NOTE — Progress Notes (Signed)
06/14/2014 2:09 PM  Patient ready for discharge, prescriptions and discharge instructions given to patient. Patient and patient's daughter states understanding.    Stacey Page

## 2014-06-14 NOTE — Progress Notes (Addendum)
      [address]       [place] [address]             [phone]       4 Days Post-Op Procedure(s) (LRB): VIDEO ASSISTED THORACOSCOPY (Right) LUNG BIOPSY (Right)  Subjective: She feels fairly well and would like to go home  Objective: Vital signs in last 24 hours: Temp:  [98.2 F (36.8 C)-98.4 F (36.9 C)] 98.2 F (36.8 C) (05/07 0750) Pulse Rate:  [62-74] 62 (05/07 0750) Cardiac Rhythm:  [-] Normal sinus rhythm (05/07 0750) Resp:  [14-30] 20 (05/07 0750) BP: (85-133)/(30-80) 125/46 mmHg (05/07 0750) SpO2:  [96 %-100 %] 100 % (05/07 0905)     Intake/Output from previous day: 05/06 0701 - 05/07 0700 In: 2040 [P.O.:2040] Out: -    Physical Exam:  Cardiovascular: RRR,  Pulmonary: Clear to auscultation bilaterally; no rales, wheezes, or rhonchi. Abdomen: Soft, non tender, bowel sounds present. Wounds: Clean and dry.  No erythema or signs of infection.   Lab Results: CBC: Recent Labs  06/12/14 0231  WBC 9.6  HGB 10.7*  HCT 32.7*  PLT 213   BMET:  Recent Labs  06/13/14 0324 06/14/14 0423  NA 135 135  K 4.7 4.3  CL 100* 100*  CO2 26 29  GLUCOSE 106* 100*  BUN 27* 28*  CREATININE 2.06* 1.48*  CALCIUM 9.1 8.9    PT/INR: No results for input(s): LABPROT, INR in the last 72 hours. ABG:  INR: Will add last result for INR, ABG once components are confirmed Will add last 4 CBG results once components are confirmed  Assessment/Plan:  1. CV - SR in the 70's. On Labetalol 200 mg bid, Lisinopril 40 mg daily. SBP labile until late yesterday. Will decrease Lisinopril to 20 mg daily to avoid hypotension. 2.  Pulmonary - On room air. Pathology results pending. 3. Creatinine decreased from 2.06 to 1.48. Her creatinine was 1.98 pre op. 4. DM-CBGs 143/132/97. On Tradjenta 5 mg daily and Insulin. 5. Likely discharge  ZIMMERMAN,DONIELLE MPA-C 06/14/2014,9:32 AM  I have seen and examined the patient and agree with the assessment and plan as  outlined.  D/C home today  Rexene Alberts 06/14/2014 12:08 PM

## 2014-06-16 ENCOUNTER — Emergency Department (HOSPITAL_COMMUNITY): Payer: PPO

## 2014-06-16 ENCOUNTER — Telehealth: Payer: Self-pay | Admitting: *Deleted

## 2014-06-16 ENCOUNTER — Inpatient Hospital Stay (HOSPITAL_COMMUNITY)
Admission: EM | Admit: 2014-06-16 | Discharge: 2014-06-22 | DRG: 200 | Disposition: A | Discharge status: Home / Self Care | Payer: PPO | Attending: Cardiothoracic Surgery | Admitting: Cardiothoracic Surgery

## 2014-06-16 ENCOUNTER — Encounter (HOSPITAL_COMMUNITY): Payer: Self-pay | Admitting: Cardiology

## 2014-06-16 DIAGNOSIS — J9311 Primary spontaneous pneumothorax: Secondary | ICD-10-CM | POA: Diagnosis not present

## 2014-06-16 DIAGNOSIS — E119 Type 2 diabetes mellitus without complications: Secondary | ICD-10-CM | POA: Diagnosis present

## 2014-06-16 DIAGNOSIS — Z6841 Body Mass Index (BMI) 40.0 and over, adult: Secondary | ICD-10-CM | POA: Diagnosis not present

## 2014-06-16 DIAGNOSIS — A319 Mycobacterial infection, unspecified: Secondary | ICD-10-CM | POA: Diagnosis not present

## 2014-06-16 DIAGNOSIS — J45909 Unspecified asthma, uncomplicated: Secondary | ICD-10-CM | POA: Diagnosis present

## 2014-06-16 DIAGNOSIS — J939 Pneumothorax, unspecified: Secondary | ICD-10-CM

## 2014-06-16 DIAGNOSIS — Z87891 Personal history of nicotine dependence: Secondary | ICD-10-CM | POA: Diagnosis not present

## 2014-06-16 DIAGNOSIS — I129 Hypertensive chronic kidney disease with stage 1 through stage 4 chronic kidney disease, or unspecified chronic kidney disease: Secondary | ICD-10-CM | POA: Diagnosis present

## 2014-06-16 DIAGNOSIS — Z825 Family history of asthma and other chronic lower respiratory diseases: Secondary | ICD-10-CM

## 2014-06-16 DIAGNOSIS — N183 Chronic kidney disease, stage 3 (moderate): Secondary | ICD-10-CM | POA: Diagnosis not present

## 2014-06-16 DIAGNOSIS — J849 Interstitial pulmonary disease, unspecified: Secondary | ICD-10-CM | POA: Diagnosis not present

## 2014-06-16 DIAGNOSIS — G4733 Obstructive sleep apnea (adult) (pediatric): Secondary | ICD-10-CM | POA: Diagnosis not present

## 2014-06-16 DIAGNOSIS — Z9989 Dependence on other enabling machines and devices: Secondary | ICD-10-CM

## 2014-06-16 DIAGNOSIS — J95811 Postprocedural pneumothorax: Secondary | ICD-10-CM | POA: Diagnosis present

## 2014-06-16 DIAGNOSIS — A310 Pulmonary mycobacterial infection: Secondary | ICD-10-CM | POA: Diagnosis not present

## 2014-06-16 DIAGNOSIS — Z7951 Long term (current) use of inhaled steroids: Secondary | ICD-10-CM

## 2014-06-16 DIAGNOSIS — J219 Acute bronchiolitis, unspecified: Secondary | ICD-10-CM | POA: Diagnosis not present

## 2014-06-16 DIAGNOSIS — E1122 Type 2 diabetes mellitus with diabetic chronic kidney disease: Secondary | ICD-10-CM | POA: Diagnosis not present

## 2014-06-16 DIAGNOSIS — R0602 Shortness of breath: Secondary | ICD-10-CM

## 2014-06-16 DIAGNOSIS — Z9689 Presence of other specified functional implants: Secondary | ICD-10-CM

## 2014-06-16 DIAGNOSIS — R0789 Other chest pain: Secondary | ICD-10-CM | POA: Diagnosis present

## 2014-06-16 LAB — BASIC METABOLIC PANEL
Anion gap: 11 (ref 5–15)
BUN: 22 mg/dL — ABNORMAL HIGH (ref 6–20)
CO2: 25 mmol/L (ref 22–32)
Calcium: 9.6 mg/dL (ref 8.9–10.3)
Chloride: 98 mmol/L — ABNORMAL LOW (ref 101–111)
Creatinine, Ser: 1.61 mg/dL — ABNORMAL HIGH (ref 0.44–1.00)
GFR, EST AFRICAN AMERICAN: 39 mL/min — AB (ref >60)
GFR, EST NON AFRICAN AMERICAN: 33 mL/min — AB (ref >60)
Glucose, Bld: 128 mg/dL — ABNORMAL HIGH (ref 70–99)
POTASSIUM: 4.3 mmol/L (ref 3.5–5.1)
Sodium: 134 mmol/L — ABNORMAL LOW (ref 135–145)

## 2014-06-16 LAB — COMPREHENSIVE METABOLIC PANEL
ALT: 26 U/L (ref 14–54)
AST: 18 U/L (ref 15–41)
Albumin: 3.5 g/dL (ref 3.5–5.0)
Alkaline Phosphatase: 49 U/L (ref 38–126)
Anion gap: 14 (ref 5–15)
BUN: 23 mg/dL — ABNORMAL HIGH (ref 6–20)
CO2: 23 mmol/L (ref 22–32)
Calcium: 9.4 mg/dL (ref 8.9–10.3)
Chloride: 98 mmol/L — ABNORMAL LOW (ref 101–111)
Creatinine, Ser: 1.52 mg/dL — ABNORMAL HIGH (ref 0.44–1.00)
GFR calc Af Amer: 41 mL/min — ABNORMAL LOW (ref >60)
GFR calc non Af Amer: 36 mL/min — ABNORMAL LOW (ref >60)
Glucose, Bld: 148 mg/dL — ABNORMAL HIGH (ref 70–99)
Potassium: 4.7 mmol/L (ref 3.5–5.1)
Sodium: 135 mmol/L (ref 135–145)
Total Bilirubin: 0.6 mg/dL (ref 0.3–1.2)
Total Protein: 7.1 g/dL (ref 6.5–8.1)

## 2014-06-16 LAB — CBC
HCT: 35.4 % — ABNORMAL LOW (ref 36.0–46.0)
HEMATOCRIT: 36.3 % (ref 36.0–46.0)
Hemoglobin: 11.5 g/dL — ABNORMAL LOW (ref 12.0–15.0)
Hemoglobin: 11.3 g/dL — ABNORMAL LOW (ref 12.0–15.0)
MCH: 29.4 pg (ref 26.0–34.0)
MCH: 29.4 pg (ref 26.0–34.0)
MCHC: 31.7 g/dL (ref 30.0–36.0)
MCHC: 31.9 g/dL (ref 30.0–36.0)
MCV: 92.8 fL (ref 78.0–100.0)
MCV: 91.9 fL (ref 78.0–100.0)
Platelets: 276 10*3/uL (ref 150–400)
Platelets: 273 10*3/uL (ref 150–400)
RBC: 3.91 MIL/uL (ref 3.87–5.11)
RBC: 3.85 MIL/uL — ABNORMAL LOW (ref 3.87–5.11)
RDW: 16.3 % — ABNORMAL HIGH (ref 11.5–15.5)
RDW: 16 % — ABNORMAL HIGH (ref 11.5–15.5)
WBC: 10.9 10*3/uL — AB (ref 4.0–10.5)
WBC: 9.8 10*3/uL (ref 4.0–10.5)

## 2014-06-16 LAB — I-STAT TROPONIN, ED: TROPONIN I, POC: 0.02 ng/mL (ref 0.00–0.08)

## 2014-06-16 LAB — PROTIME-INR
INR: 1.11 (ref 0.00–1.49)
PROTHROMBIN TIME: 14.4 s (ref 11.6–15.2)

## 2014-06-16 LAB — GLUCOSE, CAPILLARY: Glucose-Capillary: 144 mg/dL — ABNORMAL HIGH (ref 70–99)

## 2014-06-16 LAB — BRAIN NATRIURETIC PEPTIDE: B NATRIURETIC PEPTIDE 5: 30.2 pg/mL (ref 0.0–100.0)

## 2014-06-16 MED ORDER — POLYETHYLENE GLYCOL 3350 17 G PO PACK
17.0000 g | PACK | Freq: Every day | ORAL | Status: DC | PRN
  Filled 2014-06-16: qty 1

## 2014-06-16 MED ORDER — FENTANYL 10 MCG/ML IV SOLN
INTRAVENOUS | Status: DC
  Administered 2014-06-16: 23:00:00 via INTRAVENOUS
  Administered 2014-06-17: 75 ug via INTRAVENOUS
  Administered 2014-06-17: 45 ug via INTRAVENOUS
  Administered 2014-06-17: 105 ug via INTRAVENOUS
  Administered 2014-06-17: 45 ug via INTRAVENOUS
  Administered 2014-06-17: 30 ug via INTRAVENOUS
  Administered 2014-06-17: 45 ug via INTRAVENOUS
  Administered 2014-06-18: 15 ug via INTRAVENOUS
  Administered 2014-06-18: 0 ug via INTRAVENOUS
  Administered 2014-06-18: 15 ug via INTRAVENOUS
  Administered 2014-06-18 (×2): 30 ug via INTRAVENOUS
  Administered 2014-06-19: 15 ug via INTRAVENOUS
  Administered 2014-06-19: 01:00:00 via INTRAVENOUS
  Administered 2014-06-19: 14 ug via INTRAVENOUS
  Administered 2014-06-19 (×2): 15 ug via INTRAVENOUS
  Administered 2014-06-20: 30 ug via INTRAVENOUS
  Administered 2014-06-20 (×2): 15 ug via INTRAVENOUS
  Administered 2014-06-21: 0 ug via INTRAVENOUS
  Filled 2014-06-16 (×2): qty 50

## 2014-06-16 MED ORDER — SENNA 8.6 MG PO TABS
1.0000 | ORAL_TABLET | Freq: Two times a day (BID) | ORAL | Status: DC
  Administered 2014-06-16 – 2014-06-22 (×10): 8.6 mg via ORAL
  Filled 2014-06-16 (×13): qty 1

## 2014-06-16 MED ORDER — ONDANSETRON HCL 4 MG/2ML IJ SOLN: 4.0000 mg | Freq: Four times a day (QID) | INTRAMUSCULAR | Status: DC | PRN

## 2014-06-16 MED ORDER — POTASSIUM CITRATE ER 10 MEQ (1080 MG) PO TBCR
10.0000 meq | EXTENDED_RELEASE_TABLET | Freq: Every day | ORAL | Status: DC
  Administered 2014-06-17 – 2014-06-21 (×5): 10 meq via ORAL
  Filled 2014-06-16 (×7): qty 1

## 2014-06-16 MED ORDER — SODIUM CHLORIDE 0.9 % IJ SOLN: 9.0000 mL | INTRAMUSCULAR | Status: DC | PRN

## 2014-06-16 MED ORDER — LABETALOL HCL 200 MG PO TABS
200.0000 mg | ORAL_TABLET | Freq: Two times a day (BID) | ORAL | Status: DC
  Administered 2014-06-17 – 2014-06-22 (×9): 200 mg via ORAL
  Filled 2014-06-16 (×13): qty 1

## 2014-06-16 MED ORDER — KCL IN DEXTROSE-NACL 20-5-0.45 MEQ/L-%-% IV SOLN
INTRAVENOUS | Status: DC
  Administered 2014-06-16 – 2014-06-21 (×2): via INTRAVENOUS
  Filled 2014-06-16 (×4): qty 1000

## 2014-06-16 MED ORDER — OXYCODONE HCL 5 MG PO TABS
5.0000 mg | ORAL_TABLET | ORAL | Status: DC | PRN
  Administered 2014-06-16 – 2014-06-22 (×15): 5 mg via ORAL
  Filled 2014-06-16 (×15): qty 1

## 2014-06-16 MED ORDER — MORPHINE SULFATE 2 MG/ML IJ SOLN
INTRAMUSCULAR | Status: AC
  Filled 2014-06-16: qty 1

## 2014-06-16 MED ORDER — LORAZEPAM 2 MG/ML IJ SOLN
2.0000 mg | Freq: Once | INTRAMUSCULAR | Status: AC
  Administered 2014-06-16: 2 mg via INTRAVENOUS
  Filled 2014-06-16: qty 1

## 2014-06-16 MED ORDER — ENOXAPARIN SODIUM 40 MG/0.4ML ~~LOC~~ SOLN: 40.0000 mg | SUBCUTANEOUS | Status: DC

## 2014-06-16 MED ORDER — ENOXAPARIN SODIUM 30 MG/0.3ML ~~LOC~~ SOLN
30.0000 mg | SUBCUTANEOUS | Status: DC
  Administered 2014-06-17 – 2014-06-18 (×2): 30 mg via SUBCUTANEOUS
  Filled 2014-06-16 (×6): qty 0.3

## 2014-06-16 MED ORDER — MORPHINE SULFATE 2 MG/ML IJ SOLN
2.0000 mg | Freq: Once | INTRAMUSCULAR | Status: AC
  Administered 2014-06-16: 2 mg via INTRAVENOUS
  Filled 2014-06-16: qty 1

## 2014-06-16 MED ORDER — LINAGLIPTIN 5 MG PO TABS
5.0000 mg | ORAL_TABLET | Freq: Every day | ORAL | Status: DC
  Administered 2014-06-17 – 2014-06-22 (×6): 5 mg via ORAL
  Filled 2014-06-16 (×6): qty 1

## 2014-06-16 MED ORDER — MORPHINE SULFATE 2 MG/ML IJ SOLN
2.0000 mg | Freq: Once | INTRAMUSCULAR | Status: AC
Start: 2014-06-16 — End: 2014-06-16
  Administered 2014-06-16: 2 mg via INTRAVENOUS

## 2014-06-16 MED ORDER — ALBUTEROL SULFATE HFA 108 (90 BASE) MCG/ACT IN AERS: 2.0000 | INHALATION_SPRAY | Freq: Four times a day (QID) | RESPIRATORY_TRACT | Status: DC | PRN

## 2014-06-16 MED ORDER — MONTELUKAST SODIUM 10 MG PO TABS
10.0000 mg | ORAL_TABLET | Freq: Every day | ORAL | Status: DC
  Administered 2014-06-16 – 2014-06-21 (×6): 10 mg via ORAL
  Filled 2014-06-16 (×7): qty 1

## 2014-06-16 MED ORDER — LIDOCAINE HCL (PF) 1 % IJ SOLN
30.0000 mL | Freq: Once | INTRAMUSCULAR | Status: AC
  Administered 2014-06-16: 30 mL
  Filled 2014-06-16: qty 30

## 2014-06-16 MED ORDER — DIPHENHYDRAMINE HCL 12.5 MG/5ML PO ELIX
12.5000 mg | ORAL_SOLUTION | Freq: Four times a day (QID) | ORAL | Status: DC | PRN
  Filled 2014-06-16: qty 5

## 2014-06-16 MED ORDER — DIPHENHYDRAMINE HCL 50 MG/ML IJ SOLN: 12.5000 mg | Freq: Four times a day (QID) | INTRAMUSCULAR | Status: DC | PRN

## 2014-06-16 MED ORDER — ALBUTEROL SULFATE (2.5 MG/3ML) 0.083% IN NEBU: 2.5000 mg | INHALATION_SOLUTION | Freq: Four times a day (QID) | RESPIRATORY_TRACT | Status: DC | PRN

## 2014-06-16 MED ORDER — INSULIN ASPART 100 UNIT/ML ~~LOC~~ SOLN
0.0000 [IU] | Freq: Three times a day (TID) | SUBCUTANEOUS | Status: DC
  Administered 2014-06-17 – 2014-06-21 (×5): 2 [IU] via SUBCUTANEOUS

## 2014-06-16 MED ORDER — NALOXONE HCL 0.4 MG/ML IJ SOLN: 0.4000 mg | INTRAMUSCULAR | Status: DC | PRN

## 2014-06-16 MED ORDER — LISINOPRIL 20 MG PO TABS
20.0000 mg | ORAL_TABLET | Freq: Every day | ORAL | Status: DC
  Administered 2014-06-17 – 2014-06-18 (×2): 20 mg via ORAL
  Filled 2014-06-16 (×3): qty 1

## 2014-06-16 MED ORDER — FUROSEMIDE 40 MG PO TABS
40.0000 mg | ORAL_TABLET | Freq: Every day | ORAL | Status: DC
  Administered 2014-06-17 – 2014-06-22 (×6): 40 mg via ORAL
  Filled 2014-06-16 (×7): qty 1

## 2014-06-16 MED ORDER — FLUTICASONE PROPIONATE 50 MCG/ACT NA SUSP
1.0000 | Freq: Every day | NASAL | Status: DC
  Administered 2014-06-17 – 2014-06-22 (×6): 1 via NASAL
  Filled 2014-06-16: qty 16

## 2014-06-16 MED ORDER — PREDNISONE 20 MG PO TABS
20.0000 mg | ORAL_TABLET | Freq: Every day | ORAL | Status: DC
  Administered 2014-06-17 – 2014-06-22 (×6): 20 mg via ORAL
  Filled 2014-06-16 (×7): qty 1

## 2014-06-16 MED ORDER — BUDESONIDE-FORMOTEROL FUMARATE 160-4.5 MCG/ACT IN AERO
2.0000 | INHALATION_SPRAY | Freq: Two times a day (BID) | RESPIRATORY_TRACT | Status: DC
  Administered 2014-06-17 – 2014-06-22 (×11): 2 via RESPIRATORY_TRACT
  Filled 2014-06-16: qty 6

## 2014-06-16 MED ORDER — DOCUSATE SODIUM 100 MG PO CAPS
100.0000 mg | ORAL_CAPSULE | Freq: Two times a day (BID) | ORAL | Status: DC
Start: 2014-06-16 — End: 2014-06-22
  Administered 2014-06-16 – 2014-06-22 (×10): 100 mg via ORAL
  Filled 2014-06-16 (×12): qty 1

## 2014-06-16 NOTE — ED Notes (Signed)
Pt reports increased SOB over the past couple of days. States that she was here last week and had a lung biopsy done to determine why she is SOB. States she was recently released from the hospital.

## 2014-06-16 NOTE — H&P (Signed)
[address]       [place] [address]             [phone]      PCP is Olga Millers, MD Referring Provider is[facility]  Chief Complaint  Patient presents with  . Chest Pain    HPI:  Right Vats lung bx was done 5/  Patient presented  with recent diagnosis of interstitial lung disease by high definition CT scan associated with clinical symptoms of progressive shortness of breath and decreasing exercise tolerance. The patient was placed on prednisone empirically by her pulmonologist with some improvement in her symptoms. Before long-term prednisone is recommended the patient's pulmonologist wishes a lung biopsy to document the type of interstitial lung disease. The patient is a nonsmoker and is had no previous chest surgery. She is morbidly obese with diabetes and difficulty performing daily activities. The patient has had echocardiogram which was normal, PFTs which are adequate for VATS lung biopsy, with DLCO reduced at 52%.   Past Medical History  Diagnosis Date  . HTN (hypertension)   . Asthma   . Seasonal allergies   . OA (osteoarthritis)   . Interstitial lung disease 03/26/2014  . OSA (obstructive sleep apnea) 02/25/2014    wears CPAP at night  . Anxiety   . Depression   . Diabetes     Type 2  . Acute renal failure May 19, 2014  . GERD (gastroesophageal reflux disease)   . Swelling of ankle     Bilateral; takes Lasix  . Aortic stenosis, mild     mild AS 01/2014 echo  . Shortness of breath on exertion    .  Past Surgical History  Procedure Laterality Date  . Cesarean section  x2  . Polypectomy      throat  . Knee arthroscopy Right   right VATS with lung biopsy  Family History  Problem Relation Age of Onset  . COPD Father   . Cancer Mother     Social History History  Substance Use Topics  . Smoking status: Former Smoker -- 0.25 packs/day for 15 years    Types: Cigarettes    Quit date: 02/08/1980  . Smokeless  tobacco: Not on file     Comment: 2 packs per week  . Alcohol Use: No     Comment: occassional/social/rare    Current Facility-Administered Medications  Medication Dose Route Frequency Provider Last Rate Last Dose  . albuterol (PROVENTIL HFA;VENTOLIN HFA) 108 (90 BASE) MCG/ACT inhaler 2 puff  2 puff Inhalation Q6H PRN Grace Isaac, MD      . budesonide-formoterol (SYMBICORT) 160-4.5 MCG/ACT inhaler 2 puff  2 puff Inhalation BID Grace Isaac, MD      . dextrose 5 % and 0.45 % NaCl with KCl 20 mEq/L infusion   Intravenous Continuous Grace Isaac, MD      . diphenhydrAMINE (BENADRYL) injection 12.5 mg  12.5 mg Intravenous Q6H PRN Grace Isaac, MD       Or  . diphenhydrAMINE (BENADRYL) 12.5 MG/5ML elixir 12.5 mg  12.5 mg Oral Q6H PRN Grace Isaac, MD      . docusate sodium (COLACE) capsule 100 mg  100 mg Oral BID Grace Isaac, MD      . enoxaparin (LOVENOX) injection 40 mg  40 mg Subcutaneous Q24H Grace Isaac, MD      . fentaNYL 10 mcg/mL PCA injection   Intravenous 6 times per  day Grace Isaac, MD      . fluticasone [facility]) 50 MCG/ACT nasal spray 1 spray  1 spray Each [name] Daily Grace Isaac, MD      . furosemide (LASIX) tablet 40 mg  40 mg Oral Daily Grace Isaac, MD      . labetalol (NORMODYNE) tablet 200 mg  200 mg Oral BID Grace Isaac, MD      . linagliptin (TRADJENTA) tablet 5 mg  5 mg Oral Daily Grace Isaac, MD      . lisinopril (PRINIVIL,ZESTRIL) tablet 20 mg  20 mg Oral Daily Grace Isaac, MD      . montelukast (SINGULAIR) tablet 10 mg  10 mg Oral QHS Grace Isaac, MD      . naloxone [facility]) injection 0.4 mg  0.4 mg Intravenous PRN Grace Isaac, MD       And  . sodium chloride 0.9 % injection 9 mL  9 mL Intravenous PRN Grace Isaac, MD      . ondansetron [facility]) injection 4 mg  4 mg Intravenous Q6H PRN Grace Isaac, MD      . oxyCODONE (Oxy IR/ROXICODONE) immediate release tablet 5 mg  5 mg  Oral Q4H PRN Grace Isaac, MD      . polyethylene glycol (MIRALAX / GLYCOLAX) packet 17 g  17 g Oral Daily PRN Grace Isaac, MD      . potassium citrate (UROCIT-K) SR tablet 10 mEq  10 mEq Oral Daily Grace Isaac, MD      . predniSONE (DELTASONE) tablet 20 mg  20 mg Oral Daily Grace Isaac, MD      . senna [facility]) tablet 8.6 mg  1 tablet Oral BID Grace Isaac, MD       Current Outpatient Prescriptions  Medication Sig Dispense Refill  . albuterol (PROAIR HFA) 108 (90 BASE) MCG/ACT inhaler Inhale 2 puffs into the lungs every 6 (six) hours as needed for wheezing or shortness of breath. 1 Inhaler 3  . budesonide-formoterol (SYMBICORT) 160-4.5 MCG/ACT inhaler Inhale 2 puffs into the lungs 2 (two) times daily.    . Clobetasol Prop Emollient Base (CLOBETASOL PROPIONATE E) 0.05 % emollient cream Apply 1 application topically daily as needed (skin irritations).     . fluticasone (FLONASE) 50 MCG/ACT nasal spray Place 1 spray into both nostrils daily.    . furosemide (LASIX) 40 MG tablet Take 1 tablet (40 mg total) by mouth daily. 90 tablet 1  . labetalol (NORMODYNE) 200 MG tablet Take 1 tablet (200 mg total) by mouth 2 (two) times daily. 180 tablet 3  . lisinopril (PRINIVIL,ZESTRIL) 20 MG tablet Take 1 tablet (20 mg total) by mouth daily. 30 tablet 1  . montelukast (SINGULAIR) 10 MG tablet Take 10 mg by mouth at bedtime.    Marland Kitchen oxyCODONE (OXY IR/ROXICODONE) 5 MG immediate release tablet Take 1-2 tablets (5-10 mg total) by mouth every 4 (four) hours as needed for severe pain. 30 tablet 0  . potassium citrate (UROCIT-K) 10 MEQ (1080 MG) SR tablet Take 1 tablet (10 mEq total) by mouth daily. 90 tablet 3  . predniSONE (DELTASONE) 20 MG tablet Taper down to 20mg  and hold at this dose until next office visit (Patient taking differently: Take 20 mg by mouth daily. Taper down to 20mg  and hold at this dose until next office visit) 30 tablet 1  . sitaGLIPtin (JANUVIA) 100 MG tablet Take 1  tablet (100 mg total)  by mouth daily. 30 tablet 3    No Known Allergies  Review of Systems  Gen.-weight gain on prednisone, no fever HEENT-no change in vision, no active dental complaints Thorax-shortness of breath with exertion, dry cough Cardiac-normal 2-D echocardiogram, no history of angina GI-no history of hepatitis jaundice blood per rectum Endocrine-positive for diabetes not well controlled Extremities-moderate arthritis, positive for ankle edema  Neurologic-no history stroke or seizure Hematologic-no bleeding disorders  BP 106/36 mmHg  Pulse 68  Temp(Src) 98.5 F (36.9 C) (Oral)  Resp 17  Ht 5\' 8"  (1.727 m)  Wt 344 lb (156.037 kg)  BMI 52.32 kg/m2  SpO2 93% Physical Exam General appearance - alert, well appearing, and in no distress  Mental Status - alert, oriented to person, place, and time  Eyes - pupils equal and reactive, extraocular eye movements intact   Ears - bilateral TM's and external ear canals normal, not examined   Nose - normal and patent, no erythema, discharge or polyps   Sinuses - Normal paranasal sinuses without tenderness   Throat - mucous membranes moist, pharynx normal without lesions   Neck - supple, no significant adenopathy   Thyroid - thyroid is normal in size without nodules or tenderness    Chest - clear to auscultation, no wheezes, rales or rhonchi, symmetric air entry, decreased air entry noted right chest   Heart - normal rate, regular rhythm, normal S1, S2, no murmurs, rubs, clicks or gallops   Abdomen - soft, nontender, nondistended, no masses or organomegaly   Breasts - breasts appear normal, no suspicious masses, no skin or nipple changes or axillary nodes, not examined   Pelvic - normal external genitalia, vulva, vagina, cervix, uterus and adnexa, examination not indicated   Rectal - deferred, not clinically indicated   Back exam - limited range of motion   Neurological - alert, oriented, normal speech, no focal  findings or movement disorder noted   Musculoskeletal - no joint tenderness, deformity or swelling   Extremities - peripheral pulses normal, no pedal edema, no clubbing or cyanosis   Skin - normal coloration and turgor, no rashes, no suspicious skin lesions noted  Diagnostic Tests: Lab Results  Component Value Date   WBC 10.9* 06/16/2014   HGB 11.5* 06/16/2014   HCT 36.3 06/16/2014   PLT 276 06/16/2014   GLUCOSE 128* 06/16/2014   CHOL 214* 05/20/2014   TRIG 101.0 05/20/2014   HDL 71.90 05/20/2014   LDLCALC 122* 05/20/2014   ALT 21 06/12/2014   AST 18 06/12/2014   NA 134* 06/16/2014   K 4.3 06/16/2014   CL 98* 06/16/2014   CREATININE 1.61* 06/16/2014   BUN 22* 06/16/2014   CO2 25 06/16/2014   TSH 0.35 05/20/2014   INR 1.11 06/16/2014   HGBA1C 6.8* 05/20/2014   Chronic Kidney Disease   Stage I     GFR >90  Stage II    GFR 60-89  Stage IIIA GFR 45-59  Stage IIIB GFR 30-44  Stage IV   GFR 15-29  Stage V    GFR  <15  Lab Results  Component Value Date   CREATININE 1.61* 06/16/2014   Estimated Creatinine Clearance: 57.6 mL/min (by C-G formula based on Cr of 1.61).    Dg Chest 2 View  06/16/2014   CLINICAL DATA:  Worsening shortness of breath since 1200 hours today.  EXAM: CHEST  2 VIEW  COMPARISON:  06/13/2014.  FINDINGS: Interval approximately 40% right pneumothorax with increased right perihilar density. There is also  small amount right basilar pleural fluid on the right. No mediastinal shift. The cardiac silhouette remains mildly enlarged. The interstitial markings remain mildly prominent. Mild thoracic spine degenerative changes.  IMPRESSION: 1. Approximately 40% right hydropneumothorax without mediastinal shift. 2. Increased right perihilar density compatible with atelectasis and possible mild underlying hilar adenopathy. 3. Stable cardiomegaly and mild chronic interstitial lung disease.  Critical Value/emergent results were called by telephone at the time of  interpretation on 06/16/2014 at 6:05 pm to Dr. Rogene Houston , who verbally acknowledged these results.   Electronically Signed   By: Claudie Revering M.D.   On: 06/16/2014 18:08   High-definition CT scan showing interstitial lung disease in the upper lobes right greater than left. No significant adenopathy or discrete pulmonary masses.  Impression: Patient with symptomatic undiagnosed lung disease felt to be interstitial point fibrosis and prednisone has been started. Now 6 days post op right VATS and lung biopsy Chronic kidney disease Stage IIIa Plan:admit and place right chest tube   Grace Isaac, MD [facility] [phone]

## 2014-06-16 NOTE — ED Notes (Signed)
EKG completed in triage.

## 2014-06-16 NOTE — ED Provider Notes (Signed)
CSN: [id number]     Arrival date & time 06/16/14  1654 History   First MD Initiated Contact with Patient 06/16/14 1824     Chief Complaint  Patient presents with  . Chest Pain     (Consider location/radiation/quality/duration/timing/severity/associated sxs/prior Treatment) HPI Comments: Patient presents with pain and shortness of breath for the right side of her chest that onset today around noon. She felts that she had a couple episodes of pain on her right side near the site of her biopsy last week. Is associated with shortness of breath. She attempted to use her C Pap to see if that would help and took a nap. The discomfort was still there on 4 PM when she woke up. She denies any fever. She does have a dry cough. She denies any leg pain or leg swelling. X-ray done in triage shows 40% hydropneumothorax. Patient did have a lung biopsy done on May 3 of her interstitial lung disease. She denies any history of coronary artery disease. She denies any history of blood clots. She denies any leg pain or leg swelling.  The history is provided by the patient.    Past Medical History  Diagnosis Date  . HTN (hypertension)   . Asthma   . Seasonal allergies   . OA (osteoarthritis)   . Interstitial lung disease 03/26/2014  . OSA (obstructive sleep apnea) 02/25/2014    wears CPAP at night  . Anxiety   . Depression   . Diabetes     Type 2  . Acute renal failure May 19, 2014  . GERD (gastroesophageal reflux disease)   . Swelling of ankle     Bilateral; takes Lasix  . Aortic stenosis, mild     mild AS 01/2014 echo  . Shortness of breath on exertion    Past Surgical History  Procedure Laterality Date  . Cesarean section  x2  . Polypectomy      throat  . Knee arthroscopy Right    Family History  Problem Relation Age of Onset  . COPD Father   . Cancer Mother    History  Substance Use Topics  . Smoking status: Former Smoker -- 0.25 packs/day for 15 years    Types: Cigarettes    Quit  date: 02/08/1980  . Smokeless tobacco: Not on file     Comment: 2 packs per week  . Alcohol Use: No     Comment: occassional/social/rare   OB History    No data available     Review of Systems  Constitutional: Negative for fever, activity change and appetite change.  HENT: Negative for congestion and rhinorrhea.   Respiratory: Positive for cough, chest tightness and shortness of breath.   Cardiovascular: Positive for chest pain. Negative for palpitations.  Gastrointestinal: Negative for nausea, vomiting and abdominal pain.  Genitourinary: Negative for dysuria, hematuria, vaginal bleeding and vaginal discharge.  Musculoskeletal: Negative for myalgias and arthralgias.  Skin: Negative for rash.  Neurological: Negative for dizziness, weakness and headaches.  A complete 10 system review of systems was obtained and all systems are negative except as noted in the HPI and PMH.      Allergies  Review of patient's allergies indicates no known allergies.  Home Medications   Prior to Admission medications   Medication Sig Start Date End Date Taking? Authorizing Provider  albuterol (PROAIR HFA) 108 (90 BASE) MCG/ACT inhaler Inhale 2 puffs into the lungs every 6 (six) hours as needed for wheezing or shortness of breath. 01/20/14  Yes Chesley Mires, MD  budesonide-formoterol (SYMBICORT) 160-4.5 MCG/ACT inhaler Inhale 2 puffs into the lungs 2 (two) times daily.   Yes Historical Provider, MD  Clobetasol Prop Emollient Base (CLOBETASOL PROPIONATE E) 0.05 % emollient cream Apply 1 application topically daily as needed (skin irritations).    Yes Historical Provider, MD  fluticasone (FLONASE) 50 MCG/ACT nasal spray Place 1 spray into both nostrils daily.   Yes Historical Provider, MD  furosemide (LASIX) 40 MG tablet Take 1 tablet (40 mg total) by mouth daily. 04/01/14  Yes Olga Millers, MD  labetalol (NORMODYNE) 200 MG tablet Take 1 tablet (200 mg total) by mouth 2 (two) times daily. 05/26/14  Yes  Olga Millers, MD  lisinopril (PRINIVIL,ZESTRIL) 20 MG tablet Take 1 tablet (20 mg total) by mouth daily. 06/14/14  Yes Donielle Liston Alba, PA-C  montelukast (SINGULAIR) 10 MG tablet Take 10 mg by mouth at bedtime.   Yes Historical Provider, MD  oxyCODONE (OXY IR/ROXICODONE) 5 MG immediate release tablet Take 1-2 tablets (5-10 mg total) by mouth every 4 (four) hours as needed for severe pain. 06/14/14  Yes Donielle Liston Alba, PA-C  potassium citrate (UROCIT-K) 10 MEQ (1080 MG) SR tablet Take 1 tablet (10 mEq total) by mouth daily. 05/26/14  Yes Olga Millers, MD  predniSONE (DELTASONE) 20 MG tablet Taper down to 20mg  and hold at this dose until next office visit Patient taking differently: Take 20 mg by mouth daily. Taper down to 20mg  and hold at this dose until next office visit 05/01/14  Yes Tammy S Parrett, NP  sitaGLIPtin (JANUVIA) 100 MG tablet Take 1 tablet (100 mg total) by mouth daily. 05/26/14  Yes Olga Millers, MD   BP 102/36 mmHg  Pulse 62  Temp(Src) 99.2 F (37.3 C) (Oral)  Resp 20  Ht 5\' 8"  (1.727 m)  Wt 338 lb 10 oz (153.6 kg)  BMI 51.50 kg/m2  SpO2 98% Physical Exam  Constitutional: She is oriented to person, place, and time. She appears well-developed and well-nourished. No distress.  HENT:  Head: Normocephalic and atraumatic.  Mouth/Throat: Oropharynx is clear and moist. No oropharyngeal exudate.  Eyes: Conjunctivae and EOM are normal. Pupils are equal, round, and reactive to light.  Neck: Normal range of motion. Neck supple.  No meningismus.  Cardiovascular: Normal rate, regular rhythm, normal heart sounds and intact distal pulses.   No murmur heard. Pulmonary/Chest: Effort normal. No respiratory distress.  Decreased breath sounds on the R Healing surgical incisions  Abdominal: Soft. There is no tenderness. There is no rebound and no guarding.  Musculoskeletal: Normal range of motion. She exhibits no edema or tenderness.  Neurological: She is alert and  oriented to person, place, and time. No cranial nerve deficit. She exhibits normal muscle tone. Coordination normal.  No ataxia on finger to nose bilaterally. No pronator drift. 5/5 strength throughout. CN 2-12 intact. Negative Romberg. Equal grip strength. Sensation intact. Gait is normal.   Skin: Skin is warm.  Psychiatric: She has a normal mood and affect. Her behavior is normal.  Nursing note and vitals reviewed.   ED Course  Procedures (including critical care time) Labs Review Labs Reviewed  CBC - Abnormal; Notable for the following:    WBC 10.9 (*)    Hemoglobin 11.5 (*)    RDW 16.3 (*)    All other components within normal limits  BASIC METABOLIC PANEL - Abnormal; Notable for the following:    Sodium 134 (*)    Chloride 98 (*)  Glucose, Bld 128 (*)    BUN 22 (*)    Creatinine, Ser 1.61 (*)    GFR calc non Af Amer 33 (*)    GFR calc Af Amer 39 (*)    All other components within normal limits  CBC - Abnormal; Notable for the following:    RBC 3.85 (*)    Hemoglobin 11.3 (*)    HCT 35.4 (*)    RDW 16.0 (*)    All other components within normal limits  COMPREHENSIVE METABOLIC PANEL - Abnormal; Notable for the following:    Chloride 98 (*)    Glucose, Bld 148 (*)    BUN 23 (*)    Creatinine, Ser 1.52 (*)    GFR calc non Af Amer 36 (*)    GFR calc Af Amer 41 (*)    All other components within normal limits  GLUCOSE, CAPILLARY - Abnormal; Notable for the following:    Glucose-Capillary 144 (*)    All other components within normal limits  MRSA PCR SCREENING  BRAIN NATRIURETIC PEPTIDE  PROTIME-INR  I-STAT TROPOININ, ED    Imaging Review Dg Chest 2 View  06/16/2014   CLINICAL DATA:  Worsening shortness of breath since 1200 hours today.  EXAM: CHEST  2 VIEW  COMPARISON:  06/13/2014.  FINDINGS: Interval approximately 40% right pneumothorax with increased right perihilar density. There is also small amount right basilar pleural fluid on the right. No mediastinal shift.  The cardiac silhouette remains mildly enlarged. The interstitial markings remain mildly prominent. Mild thoracic spine degenerative changes.  IMPRESSION: 1. Approximately 40% right hydropneumothorax without mediastinal shift. 2. Increased right perihilar density compatible with atelectasis and possible mild underlying hilar adenopathy. 3. Stable cardiomegaly and mild chronic interstitial lung disease.  Critical Value/emergent results were called by telephone at the time of interpretation on 06/16/2014 at 6:05 pm to Dr. Rogene Houston , who verbally acknowledged these results.   Electronically Signed   By: Claudie Revering M.D.   On: 06/16/2014 18:08   Dg Chest Port 1 View  06/16/2014   CLINICAL DATA:  Status post right chest tube placement for a pneumothorax.  EXAM: PORTABLE CHEST - 1 VIEW  COMPARISON:  Earlier today.  FINDINGS: Interval right chest tube with its tip overlying the right upper lung zone, laterally. No pneumothorax is seen at this time. Decreased right perihilar atelectasis with a persistent oval component laterally. Interval mild left perihilar and left lower lobe airspace opacity. Airspace opacity in the medial right lower lung. This is obscuring the heart borders with a suggestion of cardiomegaly.  IMPRESSION: 1. No pneumothorax following right chest tube placement. 2. Bilateral atelectasis and possible pneumonia.   Electronically Signed   By: Claudie Revering M.D.   On: 06/16/2014 20:41     EKG Interpretation   Date/Time:  Monday Jun 16 2014 17:06:00 EDT Ventricular Rate:  87 PR Interval:  164 QRS Duration: 84 QT Interval:  376 QTC Calculation: 452 R Axis:   51 Text Interpretation:  Normal sinus rhythm Septal infarct , age  undetermined Abnormal ECG biphasic T wave v6 No significant change was  found Reconfirmed by Wyvonnia Dusky  MD, [place] [phone]) on 06/16/2014 6:55:25 PM      MDM   Final diagnoses:  Chest tube in place   Patient with recent lung biopsy and with shortness of breath and chest  pain. She has a pneumothorax on the right side that is new.  Creatinine baseline. Chest x-ray shows 50% hydropneumothorax on the right side  same side as previous lung biopsy.  Discussed with thoracic surgery Dr. Servando Snare. evaluated patient in place chest tube. She will be admitted for observation. She is stable nasal cannula.  Ezequiel Essex, MD 06/17/14 [phone]

## 2014-06-16 NOTE — Consult Note (Signed)
.   Name: Stacey Page MRN: [id number] DOB: 07-18-1950    ADMISSION DATE:  06/16/2014 CONSULTATION DATE:  06/16/2014  REFERRING MD :  Dr. Prescott Gum  CHIEF COMPLAINT:  SOB  BRIEF PATIENT DESCRIPTION: 63 year old female s/p VATs for biopsy in setting ILD 5/3, discharged 5/7. She presented again to ED 5/9 c/o SOB and R chest pain. CXR was done and showed 40% hydropneumothorax. Admitted to [facility], PCCM asked to assume care 5/10 AM.  SIGNIFICANT EVENTS  5/3 R VATs for biopsy  STUDIES:  Echo 01/28/14 >> severe LVH, EF 65 to [id number], grade 2 diastolic dysfx, mild AS, mild/mod LA. HST 02/12/14 >> AHI 59.3, SaO2 low 67% Auto CPAP 03/07/14 to 03/09/14 >> used on 3 of 3 nights with average 8 hrs and 8 min. Average AHI is 3.3 with median CPAP 12 cm H2O and 95 th percentile CPAP 14 cm H20. PFT 03/12/14 >> FEV1 2.05 (88%), FEV1% 87, TLC 4.14 (75%), DLCO 52%, no BD CT chest 03/20/14 >> borderline LAN, several scattered nodules up to 7 mm, b/l patchy GGO, air-trapping on inspiratory/expiratory images   HISTORY OF PRESENT ILLNESS:  64 year old female with PMH OSA on CPAP, asthma, ILD (VS patient), aortic stenosis. She first presented to pulmonary office 01/20/2014 for SOB on exertion. She received PFT, sleep study, echo and was found to have restrictive lung disease and was started on CPAP. CXR  Showed interstitial edema pattern so she was arranged for CT chest and was eventually started on steroids with good response. She was referred to [facility] for lung biopsy which she had done on 5/3 with VATs. She had chest tube in for several days an was discharged on 5/7. 5/9 she presented to ED complaining of SOB and R sided chest pain. She was found to have moderate PTX on R and TCST was consulted for admission. They placed chest tube and admitted to step down. PCCM to see.    PAST MEDICAL HISTORY :   has a past medical history of HTN (hypertension); Asthma; Seasonal allergies; OA (osteoarthritis); Interstitial lung disease  (03/26/2014); OSA (obstructive sleep apnea) (02/25/2014); Anxiety; Depression; Diabetes; Acute renal failure (May 19, 2014); GERD (gastroesophageal reflux disease); Swelling of ankle; Aortic stenosis, mild; and Shortness of breath on exertion.  has past surgical history that includes Cesarean section (x2); Polypectomy; and Knee arthroscopy (Right). Prior to Admission medications   Medication Sig Start Date End Date Taking? Authorizing Provider  albuterol (PROAIR HFA) 108 (90 BASE) MCG/ACT inhaler Inhale 2 puffs into the lungs every 6 (six) hours as needed for wheezing or shortness of breath. 01/20/14  Yes Chesley Mires, MD  budesonide-formoterol (SYMBICORT) 160-4.5 MCG/ACT inhaler Inhale 2 puffs into the lungs 2 (two) times daily.   Yes Historical Provider, MD  Clobetasol Prop Emollient Base (CLOBETASOL PROPIONATE E) 0.05 % emollient cream Apply 1 application topically daily as needed (skin irritations).    Yes Historical Provider, MD  fluticasone (FLONASE) 50 MCG/ACT nasal spray Place 1 spray into both nostrils daily.   Yes Historical Provider, MD  furosemide (LASIX) 40 MG tablet Take 1 tablet (40 mg total) by mouth daily. 04/01/14  Yes Olga Millers, MD  labetalol (NORMODYNE) 200 MG tablet Take 1 tablet (200 mg total) by mouth 2 (two) times daily. 05/26/14  Yes Olga Millers, MD  lisinopril (PRINIVIL,ZESTRIL) 20 MG tablet Take 1 tablet (20 mg total) by mouth daily. 06/14/14  Yes Donielle Liston Alba, PA-C  montelukast (SINGULAIR) 10 MG tablet Take 10  mg by mouth at bedtime.   Yes Historical Provider, MD  oxyCODONE (OXY IR/ROXICODONE) 5 MG immediate release tablet Take 1-2 tablets (5-10 mg total) by mouth every 4 (four) hours as needed for severe pain. 06/14/14  Yes Donielle Liston Alba, PA-C  potassium citrate (UROCIT-K) 10 MEQ (1080 MG) SR tablet Take 1 tablet (10 mEq total) by mouth daily. 05/26/14  Yes Olga Millers, MD  predniSONE (DELTASONE) 20 MG tablet Taper down to 20mg  and hold at this  dose until next office visit Patient taking differently: Take 20 mg by mouth daily. Taper down to 20mg  and hold at this dose until next office visit 05/01/14  Yes Tammy S Parrett, NP  sitaGLIPtin (JANUVIA) 100 MG tablet Take 1 tablet (100 mg total) by mouth daily. 05/26/14  Yes Olga Millers, MD   No Known Allergies  FAMILY HISTORY:  family history includes COPD in her father; Cancer in her mother. SOCIAL HISTORY:  reports that she quit smoking about 34 years ago. Her smoking use included Cigarettes. She has a 3.75 pack-year smoking history. She does not have any smokeless tobacco history on file. She reports that she uses illicit drugs (Marijuana). She reports that she does not drink alcohol.  REVIEW OF SYSTEMS:   Bolds are positive  Constitutional: weight loss, gain, night sweats, Fevers, chills, fatigue .  HEENT: headaches, Sore throat, sneezing, nasal congestion, post nasal drip, Difficulty swallowing, Tooth/dental problems, visual complaints visual changes, ear ache CV:  chest pain, radiates: ,Orthopnea, PND, swelling in lower extremities, dizziness, palpitations, syncope.  GI  heartburn, indigestion, abdominal pain, nausea, vomiting, diarrhea, change in bowel habits, loss of appetite, bloody stools.  Resp: cough, productive: , hemoptysis, dyspnea, chest pain, pleuritic.  Skin: rash or itching or icterus GU: dysuria, change in color of urine, urgency or frequency. flank pain, hematuria  MS: joint pain or swelling. decreased range of motion  Psych: change in mood or affect. depression or anxiety.  Neuro: difficulty with speech, weakness, numbness, ataxia   SUBJECTIVE:   VITAL SIGNS: Temp:  [98.1 F (36.7 C)-98.5 F (36.9 C)] 98.1 F (36.7 C) (05/09 2126) Pulse Rate:  [60-87] 62 (05/09 2132) Resp:  [16-24] 20 (05/09 2132) BP: (94-140)/(35-80) 102/36 mmHg (05/09 2132) SpO2:  [92 %-100 %] 95 % (05/09 2132) Weight:  [153.6 kg (338 lb 10 oz)-156.037 kg (344 lb)] 153.6 kg (338 lb  10 oz) (05/09 2126)  PHYSICAL EXAMINATION: General:  Morbidly obese female in NAD Neuro:  Alert, oriented, non-focal HEENT:  [place]/AT no JVD, PERRL Cardiovascular:  RRR, no MRG Lungs:  Clear bilateral breath sounds. Rt chest tube in place, no air leak.  Abdomen:  Soft, non-tender, non-distended Musculoskeletal:  No acute deformity or ROM limitation Skin:  Grossly intact   Recent Labs Lab 06/13/14 0324 06/14/14 0423 06/16/14 1733  NA 135 135 134*  K 4.7 4.3 4.3  CL 100* 100* 98*  CO2 26 29 25   BUN 27* 28* 22*  CREATININE 2.06* 1.48* 1.61*  GLUCOSE 106* 100* 128*    Recent Labs Lab 06/11/14 0413 06/12/14 0231 06/16/14 1733  HGB 10.6* 10.7* 11.5*  HCT 33.0* 32.7* 36.3  WBC 9.6 9.6 10.9*  PLT 217 213 276   Dg Chest 2 View  06/16/2014   CLINICAL DATA:  Worsening shortness of breath since 1200 hours today.  EXAM: CHEST  2 VIEW  COMPARISON:  06/13/2014.  FINDINGS: Interval approximately 40% right pneumothorax with increased right perihilar density. There is also small amount right basilar pleural  fluid on the right. No mediastinal shift. The cardiac silhouette remains mildly enlarged. The interstitial markings remain mildly prominent. Mild thoracic spine degenerative changes.  IMPRESSION: 1. Approximately 40% right hydropneumothorax without mediastinal shift. 2. Increased right perihilar density compatible with atelectasis and possible mild underlying hilar adenopathy. 3. Stable cardiomegaly and mild chronic interstitial lung disease.  Critical Value/emergent results were called by telephone at the time of interpretation on 06/16/2014 at 6:05 pm to Dr. Rogene Houston , who verbally acknowledged these results.   Electronically Signed   By: Claudie Revering M.D.   On: 06/16/2014 18:08   Dg Chest Port 1 View  06/16/2014   CLINICAL DATA:  Status post right chest tube placement for a pneumothorax.  EXAM: PORTABLE CHEST - 1 VIEW  COMPARISON:  Earlier today.  FINDINGS: Interval right chest tube with its  tip overlying the right upper lung zone, laterally. No pneumothorax is seen at this time. Decreased right perihilar atelectasis with a persistent oval component laterally. Interval mild left perihilar and left lower lobe airspace opacity. Airspace opacity in the medial right lower lung. This is obscuring the heart borders with a suggestion of cardiomegaly.  IMPRESSION: 1. No pneumothorax following right chest tube placement. 2. Bilateral atelectasis and possible pneumonia.   Electronically Signed   By: Claudie Revering M.D.   On: 06/16/2014 20:41    ASSESSMENT / PLAN:  R sided pneumothorax - CT by TCTS, to continuous 20 cm/H2O suction - Serial CXR  OSA on CPAP - Continue CPAP  ILD - Biopsy 5/3 > f/u biopsies - Continue preadmission regimen (prednisone, albuterol, symbicort, singulair)  HTN Chronic dCHF - Continue home lisinopril, labetalol, furosemide   [facility] Pager: [phone]  06/16/2014, 10:25 PM

## 2014-06-16 NOTE — Progress Notes (Signed)
UR COMPLETED  

## 2014-06-16 NOTE — Procedures (Signed)
      [address]       [place] [address]             [phone]      Chest Tube Insertion Procedure Note  Indications:  Clinically significant Pneumothorax  Pre-operative Diagnosis: Pneumothorax  Post-operative Diagnosis: Pneumothorax  Procedure Details  Informed consent was obtained for the procedure, including sedation.  Risks of lung perforation, hemorrhage, arrhythmia, and adverse drug reaction were discussed.   After sterile skin prep, using standard technique, a 28 French tube was placed in the right anterior 6 rib space.  Findings: Rush of air, 0nly 20 ml of fluid  Estimated Blood Loss:  Minimal         Specimens:  None              Complications:  None; patient tolerated the procedure well.         Disposition: admit to 3s         Condition: stable  Attending Attestation: I performed the procedure.

## 2014-06-16 NOTE — Telephone Encounter (Signed)
Ms. Cassell Smiles, the daughter of Ms.Stann Mainland, s/p recent VATS bx for ILD called earlier today with concerns of an episode of some shortness of breath and pain.  She was discharged on Saturday.  When I spoke with Ms. Hertzberg she had taken a pain pill about thirty minutes earlier and felt munch better.  She had used her inhaler also. Another call was made by her daughter with the same issues and she said that her mother was now using her CPAP machine to breathe easier.  I told her I thought she should go to the ER to find out the reason for her breathing issues and she agreed.

## 2014-06-17 ENCOUNTER — Inpatient Hospital Stay (HOSPITAL_COMMUNITY): Payer: PPO

## 2014-06-17 DIAGNOSIS — G4733 Obstructive sleep apnea (adult) (pediatric): Secondary | ICD-10-CM | POA: Insufficient documentation

## 2014-06-17 DIAGNOSIS — J939 Pneumothorax, unspecified: Secondary | ICD-10-CM

## 2014-06-17 DIAGNOSIS — J849 Interstitial pulmonary disease, unspecified: Secondary | ICD-10-CM | POA: Insufficient documentation

## 2014-06-17 DIAGNOSIS — Z9689 Presence of other specified functional implants: Secondary | ICD-10-CM | POA: Insufficient documentation

## 2014-06-17 DIAGNOSIS — Z9989 Dependence on other enabling machines and devices: Secondary | ICD-10-CM

## 2014-06-17 LAB — GLUCOSE, CAPILLARY
Glucose-Capillary: 121 mg/dL — ABNORMAL HIGH (ref 70–99)
Glucose-Capillary: 136 mg/dL — ABNORMAL HIGH (ref 70–99)
Glucose-Capillary: 148 mg/dL — ABNORMAL HIGH (ref 70–99)
Glucose-Capillary: 146 mg/dL — ABNORMAL HIGH (ref 70–99)

## 2014-06-17 LAB — MRSA PCR SCREENING: MRSA by PCR: NEGATIVE

## 2014-06-17 NOTE — Progress Notes (Addendum)
      [address]       [place] [address]             [phone]      Subjective:  Ms. Avera is feeling better, but continues to have pressure along her right chest.  Objective: Vital signs in last 24 hours: Temp:  [98.1 F (36.7 C)-99.2 F (37.3 C)] 98.3 F (36.8 C) (05/10 0801) Pulse Rate:  [60-87] 63 (05/10 0419) Cardiac Rhythm:  [-] Normal sinus rhythm (05/10 0419) Resp:  [16-24] 22 (05/10 0419) BP: (94-154)/(35-80) 154/56 mmHg (05/10 0419) SpO2:  [90 %-100 %] 95 % (05/10 0802) Weight:  [338 lb 10 oz (153.6 kg)-344 lb (156.037 kg)] 338 lb 10 oz (153.6 kg) (05/09 2126)  Intake/Output from previous day: 05/09 0701 - 05/10 0700 In: 530 [P.O.:480; I.V.:50] Out: 1390 [Urine:1300; Chest Tube:90]  General appearance: alert, cooperative and no distress Heart: regular rate and rhythm Lungs: clear to auscultation bilaterally Abdomen: soft, non-tender; bowel sounds normal; no masses,  no organomegaly Wound: clean and dry  Lab Results:  Recent Labs  06/16/14 1733 06/16/14 2150  WBC 10.9* 9.8  HGB 11.5* 11.3*  HCT 36.3 35.4*  PLT 276 273   BMET:  Recent Labs  06/16/14 1733 06/16/14 2150  NA 134* 135  K 4.3 4.7  CL 98* 98*  CO2 25 23  GLUCOSE 128* 148*  BUN 22* 23*  CREATININE 1.61* 1.52*  CALCIUM 9.6 9.4    PT/INR:  Recent Labs  06/16/14 1835  LABPROT 14.4  INR 1.11   ABG    Component Value Date/Time   PHART 7.291* 06/11/2014 0500   HCO3 24.5* 06/11/2014 0500   TCO2 26.1 06/11/2014 0500   ACIDBASEDEF 1.2 06/11/2014 0500   O2SAT 89.0 06/11/2014 0500   CBG (last 3)   Recent Labs  06/14/14 0835 06/14/14 1306 06/16/14 2114  GLUCAP 85 138* 144*    Assessment/Plan:  1. Spontaneous Pneumothorax- S/P Chest tube placement yesterday with resolution of pneumothorax.  No evidence of air leak, will leave chest tube to suction 2. Pulm- underlying interstitial lung diease, biopsy done 5/3, pathology remains pending 3. CV- HTN- on  Labetalol and Lisinopril 4. DM- continue SSIP and home medications 5. Dispo- patient stable, pneumothorax resolved and chest tube without air leak- leave on suction today, PCCM to assume care this morning   LOS: 1 day    BARRETT, ERIN 06/17/2014  patient examined and medical record reviewed,agree with above note. Tharon Aquas Trigt III 06/17/2014

## 2014-06-17 NOTE — Progress Notes (Signed)
Patient places themselves on CPAP. Will call if any assistance needed

## 2014-06-18 ENCOUNTER — Inpatient Hospital Stay (HOSPITAL_COMMUNITY): Payer: PPO

## 2014-06-18 ENCOUNTER — Encounter (HOSPITAL_COMMUNITY): Payer: Self-pay | Admitting: Cardiothoracic Surgery

## 2014-06-18 LAB — GLUCOSE, CAPILLARY
Glucose-Capillary: 108 mg/dL — ABNORMAL HIGH (ref 70–99)
Glucose-Capillary: 112 mg/dL — ABNORMAL HIGH (ref 70–99)
Glucose-Capillary: 145 mg/dL — ABNORMAL HIGH (ref 70–99)
Glucose-Capillary: 136 mg/dL — ABNORMAL HIGH (ref 70–99)

## 2014-06-18 NOTE — Progress Notes (Signed)
.   Name: Stacey Page MRN: [id number] DOB: 05-28-1950    ADMISSION DATE:  06/16/2014 CONSULTATION DATE:  06/16/2014  REFERRING MD :  Dr. Prescott Gum  CHIEF COMPLAINT:  SOB  BRIEF PATIENT DESCRIPTION: 64 year old female s/p VATs for biopsy in setting ILD 5/3, discharged 5/7. She presented again to ED 5/9 c/o SOB and R chest pain. CXR was done and showed 40% hydropneumothorax. Admitted to [facility], PCCM asked to assume care 5/10 AM.  SIGNIFICANT EVENTS  5/3 R VATs for biopsy  STUDIES:  Echo 01/28/14 >> severe LVH, EF 65 to [id number], grade 2 diastolic dysfx, mild AS, mild/mod LA. HST 02/12/14 >> AHI 59.3, SaO2 low 67% Auto CPAP 03/07/14 to 03/09/14 >> used on 3 of 3 nights with average 8 hrs and 8 min. Average AHI is 3.3 with median CPAP 12 cm H2O and 95 th percentile CPAP 14 cm H20. PFT 03/12/14 >> FEV1 2.05 (88%), FEV1% 87, TLC 4.14 (75%), DLCO 52%, no BD CT chest 03/20/14 >> borderline LAN, several scattered nodules up to 7 mm, b/l patchy GGO, air-trapping on inspiratory/expiratory images   SUBJECTIVE: Pain improved No dyspnea 93% on room air  VITAL SIGNS: Temp:  [97.8 F (36.6 C)-99.3 F (37.4 C)] 97.8 F (36.6 C) (05/11 0737) Pulse Rate:  [64-74] 65 (05/11 1128) Resp:  [12-23] 19 (05/11 1128) BP: (94-149)/(35-77) 149/44 mmHg (05/11 1128) SpO2:  [90 %-97 %] 95 % (05/11 1128)  PHYSICAL EXAMINATION: General:  Morbidly obese female in NAD Neuro:  Alert, oriented, non-focal HEENT:  [place]/AT no JVD, PERRL Cardiovascular:  RRR, no MRG Lungs:  Clear bilateral breath sounds. Rt chest tube in place, no air leak.  Abdomen:  Soft, non-tender, non-distended Musculoskeletal:  No acute deformity or ROM limitation Skin:  Grossly intact   Recent Labs Lab 06/14/14 0423 06/16/14 1733 06/16/14 2150  NA 135 134* 135  K 4.3 4.3 4.7  CL 100* 98* 98*  CO2 29 25 23   BUN 28* 22* 23*  CREATININE 1.48* 1.61* 1.52*  GLUCOSE 100* 128* 148*    Recent Labs Lab 06/12/14 0231 06/16/14 1733  06/16/14 2150  HGB 10.7* 11.5* 11.3*  HCT 32.7* 36.3 35.4*  WBC 9.6 10.9* 9.8  PLT 213 276 273   Dg Chest 2 View  06/16/2014   CLINICAL DATA:  Worsening shortness of breath since 1200 hours today.  EXAM: CHEST  2 VIEW  COMPARISON:  06/13/2014.  FINDINGS: Interval approximately 40% right pneumothorax with increased right perihilar density. There is also small amount right basilar pleural fluid on the right. No mediastinal shift. The cardiac silhouette remains mildly enlarged. The interstitial markings remain mildly prominent. Mild thoracic spine degenerative changes.  IMPRESSION: 1. Approximately 40% right hydropneumothorax without mediastinal shift. 2. Increased right perihilar density compatible with atelectasis and possible mild underlying hilar adenopathy. 3. Stable cardiomegaly and mild chronic interstitial lung disease.  Critical Value/emergent results were called by telephone at the time of interpretation on 06/16/2014 at 6:05 pm to Dr. Rogene Houston , who verbally acknowledged these results.   Electronically Signed   By: Claudie Revering M.D.   On: 06/16/2014 18:08   Dg Chest Port 1 View  06/18/2014   CLINICAL DATA:  Right-sided pneumothorax  EXAM: PORTABLE CHEST - 1 VIEW  COMPARISON:  06/17/2014  FINDINGS: Cardiac shadow remains enlarged. The lungs are hyperinflated bilaterally. Thickening of the minor fissure is again noted on the right. No pneumothorax is seen. A right-sided chest tube remains in place.  IMPRESSION: Persistent thickening along  the minor fissure. No acute abnormality is noted. No pneumothorax is seen.   Electronically Signed   By: Inez Catalina M.D.   On: 06/18/2014 08:04   Dg Chest Port 1 View  06/17/2014   CLINICAL DATA:  Chest tube  EXAM: PORTABLE CHEST - 1 VIEW  COMPARISON:  06/16/2014  FINDINGS: Cardiomegaly again noted. Right chest tube is unchanged in position. No pneumothorax. Persistent right perihilar atelectasis or infiltrate. Persistent infiltrate/ pneumonia right lower lobe  infrahilar. Mild left basilar atelectasis or infiltrate. No pulmonary edema.  IMPRESSION: Right chest tube is unchanged in position. No pneumothorax. Persistent right perihilar atelectasis or infiltrate. Persistent infiltrate/ pneumonia right lower lobe infrahilar. Mild left basilar atelectasis or infiltrate. No pulmonary edema.   Electronically Signed   By: Lahoma Crocker M.D.   On: 06/17/2014 08:04   Dg Chest Port 1 View  06/16/2014   CLINICAL DATA:  Status post right chest tube placement for a pneumothorax.  EXAM: PORTABLE CHEST - 1 VIEW  COMPARISON:  Earlier today.  FINDINGS: Interval right chest tube with its tip overlying the right upper lung zone, laterally. No pneumothorax is seen at this time. Decreased right perihilar atelectasis with a persistent oval component laterally. Interval mild left perihilar and left lower lobe airspace opacity. Airspace opacity in the medial right lower lung. This is obscuring the heart borders with a suggestion of cardiomegaly.  IMPRESSION: 1. No pneumothorax following right chest tube placement. 2. Bilateral atelectasis and possible pneumonia.   Electronically Signed   By: Claudie Revering M.D.   On: 06/16/2014 20:41    ASSESSMENT / PLAN:  R sided pneumothorax - CT by TCTS, to water seal, hopefully discontinue soon   OSA on CPAP - Continue CPAP  ILD - Biopsy 5/3 > called pathology, awaiting final report from [place] preadmission regimen (prednisone, albuterol, symbicort, singulair)  HTN Chronic dCHF - Continue home labetalol, furosemide Hold lisinopril   Kara Mead MD. FCCP. [facility] Pager [phone] If no response call [phone]    06/18/2014, 12:06 PM

## 2014-06-18 NOTE — Progress Notes (Signed)
Patient ambulated around unit. On RA when in room. Desats with activity. While ambulating patient dropped sats to low 80s, patient became tachypneic and SOB but per pt SOB was "about the same as usual." 3L [place] was placed for duration of walk and sats remained 89-91%. Sats returned to mid 90s after 2-3 minutes of rest.

## 2014-06-18 NOTE — Progress Notes (Signed)
Patient has home CPAP set up at bedside.  Patient states her daughter will assist her with putting mask on.  Patient encouraged to call for assistance if needed.

## 2014-06-18 NOTE — Progress Notes (Addendum)
      [address]       [place] [address]             [phone]      Subjective:  Ms. Kozub states she had a rough night last night, due to pain.  However, she is doing better this morning.  Objective: Vital signs in last 24 hours: Temp:  [97.9 F (36.6 C)-99.3 F (37.4 C)] 97.9 F (36.6 C) (05/11 0352) Pulse Rate:  [64-75] 64 (05/11 0737) Cardiac Rhythm:  [-] Normal sinus rhythm (05/11 0800) Resp:  [12-23] 12 (05/11 0800) BP: (94-132)/(35-77) 123/47 mmHg (05/11 0737) SpO2:  [90 %-97 %] 95 % (05/11 0800)  Intake/Output from previous day: 05/10 0701 - 05/11 0700 In: 2160 [P.O.:1920; I.V.:240] Out: 1655 [Urine:1625; Chest Tube:30] Intake/Output this shift: Total I/O In: -  Out: 10 [Chest Tube:10]  General appearance: alert, cooperative and no distress Heart: regular rate and rhythm Lungs: clear to auscultation bilaterally Abdomen: soft, non-tender; bowel sounds normal; no masses,  no organomegaly Wound: clean and dry  Lab Results:  Recent Labs  06/16/14 1733 06/16/14 2150  WBC 10.9* 9.8  HGB 11.5* 11.3*  HCT 36.3 35.4*  PLT 276 273   BMET:  Recent Labs  06/16/14 1733 06/16/14 2150  NA 134* 135  K 4.3 4.7  CL 98* 98*  CO2 25 23  GLUCOSE 128* 148*  BUN 22* 23*  CREATININE 1.61* 1.52*  CALCIUM 9.6 9.4    PT/INR:  Recent Labs  06/16/14 1835  LABPROT 14.4  INR 1.11   ABG    Component Value Date/Time   PHART 7.291* 06/11/2014 0500   HCO3 24.5* 06/11/2014 0500   TCO2 26.1 06/11/2014 0500   ACIDBASEDEF 1.2 06/11/2014 0500   O2SAT 89.0 06/11/2014 0500   CBG (last 3)   Recent Labs  06/17/14 1637 06/17/14 2136 06/18/14 0734  GLUCAP 148* 146* 108*    Assessment/Plan:  1. Chest tube- no air leak this morning, CXR remains stable and is free of pneumothorax 2. Pulm- biopsy for ILD done 5/3 3. CV- HTN stable on Labetalol and Lisionpril 4. Dispo- care per pulmonary, will leave chest tube to suction for now, discuss with  Dr. Prescott Gum timing for placing to water seal   LOS: 2 days    BARRETT, Junie Panning 06/18/2014  cxr reviewed- leave tube to water seal 48 hrs with daily CXR patient examined and medical record reviewed,agree with above note. Tharon Aquas Trigt III 06/18/2014

## 2014-06-19 ENCOUNTER — Ambulatory Visit: Payer: PPO | Admitting: Pulmonary Disease

## 2014-06-19 ENCOUNTER — Encounter (HOSPITAL_COMMUNITY): Payer: Self-pay

## 2014-06-19 ENCOUNTER — Inpatient Hospital Stay (HOSPITAL_COMMUNITY): Payer: PPO

## 2014-06-19 DIAGNOSIS — Z87891 Personal history of nicotine dependence: Secondary | ICD-10-CM

## 2014-06-19 LAB — GLUCOSE, CAPILLARY
Glucose-Capillary: 111 mg/dL — ABNORMAL HIGH (ref 65–99)
Glucose-Capillary: 123 mg/dL — ABNORMAL HIGH (ref 65–99)
Glucose-Capillary: 152 mg/dL — ABNORMAL HIGH (ref 65–99)
Glucose-Capillary: 97 mg/dL (ref 65–99)

## 2014-06-19 MED ORDER — AZITHROMYCIN 500 MG PO TABS
500.0000 mg | ORAL_TABLET | Freq: Once | ORAL | Status: AC
  Administered 2014-06-19: 500 mg via ORAL
  Filled 2014-06-19: qty 1

## 2014-06-19 MED ORDER — RIFAMPIN 300 MG PO CAPS
600.0000 mg | ORAL_CAPSULE | ORAL | Status: DC
  Administered 2014-06-20: 600 mg via ORAL
  Filled 2014-06-19: qty 2

## 2014-06-19 MED ORDER — RIFAMPIN 300 MG PO CAPS
600.0000 mg | ORAL_CAPSULE | Freq: Once | ORAL | Status: AC
  Administered 2014-06-19: 600 mg via ORAL
  Filled 2014-06-19: qty 2

## 2014-06-19 MED ORDER — ETHAMBUTOL HCL 400 MG PO TABS
2400.0000 mg | ORAL_TABLET | ORAL | Status: DC
  Administered 2014-06-20: 2400 mg via ORAL
  Filled 2014-06-19: qty 6

## 2014-06-19 MED ORDER — AZITHROMYCIN 500 MG PO TABS
500.0000 mg | ORAL_TABLET | ORAL | Status: DC
  Administered 2014-06-20: 500 mg via ORAL
  Filled 2014-06-19: qty 1

## 2014-06-19 MED ORDER — ETHAMBUTOL HCL 400 MG PO TABS
2400.0000 mg | ORAL_TABLET | Freq: Once | ORAL | Status: AC
  Administered 2014-06-19: 2400 mg via ORAL
  Filled 2014-06-19: qty 6

## 2014-06-19 NOTE — Consult Note (Signed)
[facility]  Date of Admission:  06/16/2014  Date of Consult:  06/19/2014  Reason for Consult: MAI Referring Physician: Elsworth Soho  Impression/Recommendation [name] DM2 with CRI 3 Obesity  Would Check quantiferon Sputum Cx for AFB Start tx for mai (which she requests) Spoke with path, her tissue block will be sent for tb pcr Check HIV  Comment- Would like to be sure this is not TB. Was her specimen probed for TB? As well will need speciation and sensi of her Mycobacteria.  I discussed with her anbx side effects, likelihood of cure.  Thank you so much for this interesting consult,   Bobby Rumpf (pager) [phone] [email or web address]  Stacey Page is an 64 y.o. female.  HPI:  64 yo F with hx of DM, stage III CRI, obesity, ILD and worsening SOB and DOE. She was adm on 5-3 for lung Bx.  She underwent VATS on same day. She did well and was d/c home on 5-7. She returned on 5-9 with 40% R pneumothorax. She had CT placed.  Her Bx has since shown bronchiolitis with scattered non-necrotizing granulomatous inflammation and rare AFB, most consistent with atypical mycobacteria.     Past Medical History  Diagnosis Date  . HTN (hypertension)   . Asthma   . Seasonal allergies   . OA (osteoarthritis)   . Interstitial lung disease 03/26/2014  . OSA (obstructive sleep apnea) 02/25/2014    wears CPAP at night  . Anxiety   . Depression   . Diabetes     Type 2  . Acute renal failure May 19, 2014  . GERD (gastroesophageal reflux disease)   . Swelling of ankle     Bilateral; takes Lasix  . Aortic stenosis, mild     mild AS 01/2014 echo  . Shortness of breath on exertion     Past Surgical History  Procedure Laterality Date  . Cesarean section  x2  . Polypectomy      throat  . Knee arthroscopy Right   . Video assisted thoracoscopy Right 06/10/2014    Procedure: VIDEO ASSISTED THORACOSCOPY;  Surgeon: Ivin Poot, MD;  Location: [facility];  Service: Thoracic;  Laterality: Right;  . Lung biopsy Right 06/10/2014    Procedure: LUNG BIOPSY;  Surgeon: Ivin Poot, MD;  Location: [facility];  Service: Thoracic;  Laterality: Right;     No Known Allergies  Medications:  Scheduled: . budesonide-formoterol  2 puff Inhalation BID  . docusate sodium  100 mg Oral BID  . enoxaparin (LOVENOX) injection  30 mg Subcutaneous Q24H  . fentaNYL   Intravenous 6 times per day  . fluticasone  1 spray Each [name] Daily  . furosemide  40 mg Oral Daily  . insulin aspart  0-15 Units Subcutaneous TID WC  . labetalol  200 mg Oral BID  . linagliptin  5 mg Oral Daily  . montelukast  10 mg Oral QHS  . potassium citrate  10 mEq Oral Daily  . predniSONE  20 mg Oral Daily  . senna  1 tablet Oral BID    Abtx:  Anti-infectives    None      Total days of antibiotics 0          Social History:  reports that she quit smoking about 34 years ago. Her smoking use included Cigarettes. She has a 3.75 pack-year smoking history. She does not have any smokeless tobacco history on file. She reports that she uses illicit drugs (  Marijuana). She reports that she does not drink alcohol.  Family History  Problem Relation Age of Onset  . COPD Father   . Cancer Mother     General ROS: no change in wt, occs cough, mostly AM, no LAN. no hx of TB exposure/homelessness/incarceration, nl BM, nl urinatino, denies LE edema. see hPI.   Blood pressure 102/36, pulse 60, temperature 98.6 F (37 C), temperature source Oral, resp. rate 22, height 5\' 8"  (1.727 m), weight 153.6 kg (338 lb 10 oz), SpO2 97 %. General appearance: alert, cooperative and no distress Eyes: negative findings: pupils equal, round, reactive to light and accomodation Throat: normal findings: oropharynx pink & moist without lesions or evidence of thrush Neck: no adenopathy and supple, symmetrical, trachea midline Lungs: rhonchi bibasilar Heart: regular rate and rhythm Abdomen: normal findings: bowel  sounds normal and soft, non-tender Extremities: edema trace Lymph nodes: Cervical adenopathy: none and Supraclavicular adenopathy: none   Results for orders placed or performed during the hospital encounter of 06/16/14 (from the past 48 hour(s))  Glucose, capillary     Status: Abnormal   Collection Time: 06/17/14  4:37 PM  Result Value Ref Range   Glucose-Capillary 148 (H) 70 - 99 mg/dL  Glucose, capillary     Status: Abnormal   Collection Time: 06/17/14  9:36 PM  Result Value Ref Range   Glucose-Capillary 146 (H) 70 - 99 mg/dL  Glucose, capillary     Status: Abnormal   Collection Time: 06/18/14  7:34 AM  Result Value Ref Range   Glucose-Capillary 108 (H) 70 - 99 mg/dL  Glucose, capillary     Status: Abnormal   Collection Time: 06/18/14 12:07 PM  Result Value Ref Range   Glucose-Capillary 112 (H) 70 - 99 mg/dL  Glucose, capillary     Status: Abnormal   Collection Time: 06/18/14  5:21 PM  Result Value Ref Range   Glucose-Capillary 145 (H) 70 - 99 mg/dL   Comment 1 Notify RN    Comment 2 Document in Chart   Glucose, capillary     Status: Abnormal   Collection Time: 06/18/14  9:31 PM  Result Value Ref Range   Glucose-Capillary 136 (H) 70 - 99 mg/dL  Glucose, capillary     Status: Abnormal   Collection Time: 06/19/14  7:39 AM  Result Value Ref Range   Glucose-Capillary 111 (H) 65 - 99 mg/dL  Glucose, capillary     Status: Abnormal   Collection Time: 06/19/14 11:53 AM  Result Value Ref Range   Glucose-Capillary 123 (H) 65 - 99 mg/dL   No results found for: SDES, SPECREQUEST, CULT, REPTSTATUS Dg Chest Port 1 View  06/19/2014   CLINICAL DATA:  Chest tube placement for right-sided pneumothorax  EXAM: PORTABLE CHEST - 1 VIEW  COMPARISON:  Jun 18, 2014  FINDINGS: Chest tube remains on the right without change. No pneumothorax apparent. There is patchy atelectasis in both mid lungs. There is no edema or consolidation. Heart is enlarged with pulmonary vascularity within normal limits.  No adenopathy.  IMPRESSION: No apparent pneumothorax atelectasis both mid lungs. No consolidation or edema. Stable cardiomegaly.   Electronically Signed   By: Lowella Grip III M.D.   On: 06/19/2014 08:09   Dg Chest Port 1 View  06/18/2014   CLINICAL DATA:  Right-sided pneumothorax  EXAM: PORTABLE CHEST - 1 VIEW  COMPARISON:  06/17/2014  FINDINGS: Cardiac shadow remains enlarged. The lungs are hyperinflated bilaterally. Thickening of the minor fissure is again noted on the right.  No pneumothorax is seen. A right-sided chest tube remains in place.  IMPRESSION: Persistent thickening along the minor fissure. No acute abnormality is noted. No pneumothorax is seen.   Electronically Signed   By: Inez Catalina M.D.   On: 06/18/2014 08:04   Recent Results (from the past 240 hour(s))  MRSA PCR Screening     Status: None   Collection Time: 06/16/14 10:50 PM  Result Value Ref Range Status   MRSA by PCR NEGATIVE NEGATIVE Final    Comment:        The GeneXpert MRSA Assay (FDA approved for NASAL specimens only), is one component of a comprehensive MRSA colonization surveillance program. It is not intended to diagnose MRSA infection nor to guide or monitor treatment for MRSA infections.       06/19/2014, 12:54 PM     LOS: 3 days

## 2014-06-19 NOTE — Progress Notes (Addendum)
       [address]       [place] [address]             [phone]               Subjective: Having a lot of pain at CT site this am.  Feels like she got behind on taking pain meds overnight, but feels better after Oxy this am.  Breathing stable.   Objective: Vital signs in last 24 hours: Patient Vitals for the past 24 hrs:  BP Temp Temp src Pulse Resp SpO2  06/19/14 0738 - - - - - 94 %  06/19/14 0729 105/60 mmHg 98.6 F (37 C) Oral 66 (!) 22 92 %  06/19/14 0447 (!) 98/54 mmHg 98.3 F (36.8 C) Oral 63 19 92 %  06/19/14 0432 - - - - 19 94 %  06/19/14 0045 (!) 109/57 mmHg 98.5 F (36.9 C) Oral 71 (!) 21 93 %  06/19/14 0028 - - - - 19 94 %  06/18/14 2145 (!) 150/43 mmHg - - 72 - -  06/18/14 2023 - - - - - 93 %  06/18/14 1939 - - - - 17 95 %  06/18/14 1935 135/74 mmHg 98 F (36.7 C) Oral 68 16 92 %  06/18/14 1600 - - - - 20 93 %  06/18/14 1547 (!) 141/62 mmHg 98 F (36.7 C) Oral 66 (!) 21 91 %  06/18/14 1200 - - - - 20 92 %  06/18/14 1128 (!) 149/44 mmHg 97.9 F (36.6 C) Oral 65 19 95 %  06/18/14 1049 (!) 136/56 mmHg - - 74 - -   Current Weight  06/16/14 338 lb 10 oz (153.6 kg)     Intake/Output from previous day: 05/11 0701 - 05/12 0700 In: 1360 [P.O.:1200; I.V.:160] Out: 2080 [Urine:2050; Chest Tube:30]    PHYSICAL EXAM:  Heart: RRR Lungs: Diminished BS R base Wound: Clean and dry Chest tube: No air leak    Lab Results: CBC: Recent Labs  06/16/14 1733 06/16/14 2150  WBC 10.9* 9.8  HGB 11.5* 11.3*  HCT 36.3 35.4*  PLT 276 273   BMET:  Recent Labs  06/16/14 1733 06/16/14 2150  NA 134* 135  K 4.3 4.7  CL 98* 98*  CO2 25 23  GLUCOSE 128* 148*  BUN 22* 23*  CREATININE 1.61* 1.52*  CALCIUM 9.6 9.4    PT/INR:  Recent Labs  06/16/14 1835  LABPROT 14.4  INR 1.11   CXR: FINDINGS: Chest tube remains on the right without change. No pneumothorax apparent. There is patchy atelectasis in both mid lungs. There is  no edema or consolidation. Heart is enlarged with pulmonary vascularity within normal limits. No adenopathy.  IMPRESSION: No apparent pneumothorax atelectasis both mid lungs. No consolidation or edema. Stable cardiomegaly.   Assessment/Plan: CXR with no obvious ptx, CT with no air leak. Continue CT to water seal, hopefully can d/c CT soon. HTN- BPs generally stable, back on home meds. Ambulate, continue pulm toilet.   LOS: 3 days    COLLINS,GINA H 06/19/2014  Check CXR in am and if no change DC tube Lung bx with atypical TB patient examined and medical record reviewed,agree with above note. Tharon Aquas Trigt III 06/19/2014

## 2014-06-19 NOTE — Progress Notes (Signed)
Pt is using home CPAP machine. Pt states she does not require any assistance with mask application or CPAP machine and will place herself on when ready. Pt was encouraged to contact RT for assistance if needed. RT will continue to monitor as needed.

## 2014-06-19 NOTE — Progress Notes (Signed)
.   Name: Stacey Page MRN: [id number] DOB: January 16, 1951    ADMISSION DATE:  06/16/2014 CONSULTATION DATE:  06/16/2014  REFERRING MD :  Dr. Prescott Gum  CHIEF COMPLAINT:  SOB  BRIEF PATIENT DESCRIPTION: 64 year old female s/p VATs for biopsy in setting ILD 5/3, discharged 5/7. She presented again to ED 5/9 c/o SOB and R chest pain. CXR was done and showed 40% hydropneumothorax. Admitted to TCST, PCCM asked to assume care 5/10 AM.  SIGNIFICANT EVENTS  5/3 R VATs for biopsy  STUDIES:  Echo 01/28/14 >> severe LVH, EF 65 to [id number], grade 2 diastolic dysfx, mild AS, mild/mod LA. HST 02/12/14 >> AHI 59.3, SaO2 low 67% Auto CPAP 03/07/14 to 03/09/14 >> used on 3 of 3 nights with average 8 hrs and 8 min. Average AHI is 3.3 with median CPAP 12 cm H2O and 95 th percentile CPAP 14 cm H20. PFT 03/12/14 >> FEV1 2.05 (88%), FEV1% 87, TLC 4.14 (75%), DLCO 52%, no BD CT chest 03/20/14 >> borderline LAN, several scattered nodules up to 7 mm, b/l patchy GGO, air-trapping on inspiratory/expiratory images   SUBJECTIVE: Pain persists No dyspnea C/o numbness rt side 93% on room air  VITAL SIGNS: Temp:  [97.9 F (36.6 C)-98.6 F (37 C)] 98.6 F (37 C) (05/12 0729) Pulse Rate:  [63-72] 66 (05/12 0729) Resp:  [16-22] 22 (05/12 0729) BP: (98-150)/(43-74) 105/60 mmHg (05/12 0729) SpO2:  [91 %-95 %] 94 % (05/12 0738)  PHYSICAL EXAMINATION: General:  Morbidly obese female in NAD Neuro:  Alert, oriented, non-focal HEENT:  /AT no JVD, PERRL Cardiovascular:  RRR, no MRG Lungs:  Clear bilateral breath sounds. Rt chest tube in place, no air leak.  Abdomen:  Soft, non-tender, non-distended Musculoskeletal:  No acute deformity or ROM limitation Skin:  Grossly intact   Recent Labs Lab 06/14/14 0423 06/16/14 1733 06/16/14 2150  NA 135 134* 135  K 4.3 4.3 4.7  CL 100* 98* 98*  CO2 29 25 23   BUN 28* 22* 23*  CREATININE 1.48* 1.61* 1.52*  GLUCOSE 100* 128* 148*    Recent Labs Lab 06/16/14 1733  06/16/14 2150  HGB 11.5* 11.3*  HCT 36.3 35.4*  WBC 10.9* 9.8  PLT 276 273   Dg Chest Port 1 View  06/19/2014   CLINICAL DATA:  Chest tube placement for right-sided pneumothorax  EXAM: PORTABLE CHEST - 1 VIEW  COMPARISON:  Jun 18, 2014  FINDINGS: Chest tube remains on the right without change. No pneumothorax apparent. There is patchy atelectasis in both mid lungs. There is no edema or consolidation. Heart is enlarged with pulmonary vascularity within normal limits. No adenopathy.  IMPRESSION: No apparent pneumothorax atelectasis both mid lungs. No consolidation or edema. Stable cardiomegaly.   Electronically Signed   By: Lowella Grip III M.D.   On: 06/19/2014 08:09   Dg Chest Port 1 View  06/18/2014   CLINICAL DATA:  Right-sided pneumothorax  EXAM: PORTABLE CHEST - 1 VIEW  COMPARISON:  06/17/2014  FINDINGS: Cardiac shadow remains enlarged. The lungs are hyperinflated bilaterally. Thickening of the minor fissure is again noted on the right. No pneumothorax is seen. A right-sided chest tube remains in place.  IMPRESSION: Persistent thickening along the minor fissure. No acute abnormality is noted. No pneumothorax is seen.   Electronically Signed   By: Inez Catalina M.D.   On: 06/18/2014 08:04    ASSESSMENT / PLAN:  R sided pneumothorax - CT by TCTS, to water seal, hopefully discontinue today  OSA on CPAP - Continue CPAP  ILD - Biopsy 5/3 > pathology >> chronic hypersens pneumonitis, atypical mycobacteria - Continue preadmission regimen (prednisone, albuterol, symbicort, singulair) -ct pred 20 mg -will get ID input  HTN Chronic dCHF - Continue home labetalol, furosemide Hold lisinopril   Kara Mead MD. FCCP. [facility] Pager [phone] If no response call [phone]    06/19/2014, 11:24 AM

## 2014-06-20 ENCOUNTER — Inpatient Hospital Stay (HOSPITAL_COMMUNITY): Payer: PPO

## 2014-06-20 DIAGNOSIS — N183 Chronic kidney disease, stage 3 (moderate): Secondary | ICD-10-CM

## 2014-06-20 DIAGNOSIS — E669 Obesity, unspecified: Secondary | ICD-10-CM

## 2014-06-20 DIAGNOSIS — J939 Pneumothorax, unspecified: Secondary | ICD-10-CM | POA: Insufficient documentation

## 2014-06-20 DIAGNOSIS — E1122 Type 2 diabetes mellitus with diabetic chronic kidney disease: Secondary | ICD-10-CM

## 2014-06-20 DIAGNOSIS — A310 Pulmonary mycobacterial infection: Secondary | ICD-10-CM

## 2014-06-20 DIAGNOSIS — Z794 Long term (current) use of insulin: Secondary | ICD-10-CM

## 2014-06-20 DIAGNOSIS — A319 Mycobacterial infection, unspecified: Secondary | ICD-10-CM | POA: Insufficient documentation

## 2014-06-20 DIAGNOSIS — J219 Acute bronchiolitis, unspecified: Secondary | ICD-10-CM

## 2014-06-20 LAB — GLUCOSE, CAPILLARY
Glucose-Capillary: 99 mg/dL (ref 65–99)
Glucose-Capillary: 103 mg/dL — ABNORMAL HIGH (ref 65–99)
Glucose-Capillary: 110 mg/dL — ABNORMAL HIGH (ref 65–99)
Glucose-Capillary: 107 mg/dL — ABNORMAL HIGH (ref 65–99)

## 2014-06-20 LAB — HIV ANTIBODY (ROUTINE TESTING W REFLEX): HIV SCREEN 4TH GENERATION: NONREACTIVE

## 2014-06-20 NOTE — Progress Notes (Addendum)
[address]       [place] [address]             [phone]          Subjective: Mild SOB  Objective: Vital signs in last 24 hours: Temp:  [97.8 F (36.6 C)-98.6 F (37 C)] 98 F (36.7 C) (05/13 0700) Pulse Rate:  [60-74] 64 (05/13 0401) Cardiac Rhythm:  [-] Normal sinus rhythm (05/12 1955) Resp:  [18-27] 18 (05/13 0413) BP: (102-115)/(36-55) 103/55 mmHg (05/13 0401) SpO2:  [91 %-97 %] 97 % (05/13 0413)  Hemodynamic parameters for last 24 hours:    Intake/Output from previous day: 05/12 0701 - 05/13 0700 In: 320 [I.V.:320] Out: 1655 [Urine:1625; Chest Tube:30] Intake/Output this shift:    General appearance: alert, cooperative and no distress Heart: regular rate and rhythm Lungs: dim in bases Abdomen: soft, non-tender Wound: incis healing well  Lab Results: No results for input(s): WBC, HGB, HCT, PLT in the last 72 hours. BMET: No results for input(s): NA, K, CL, CO2, GLUCOSE, BUN, CREATININE, CALCIUM in the last 72 hours.  PT/INR: No results for input(s): LABPROT, INR in the last 72 hours. ABG    Component Value Date/Time   PHART 7.291* 06/11/2014 0500   HCO3 24.5* 06/11/2014 0500   TCO2 26.1 06/11/2014 0500   ACIDBASEDEF 1.2 06/11/2014 0500   O2SAT 89.0 06/11/2014 0500   CBG (last 3)   Recent Labs  06/19/14 1153 06/19/14 1822 06/19/14 2115  GLUCAP 123* 152* 97    Meds Scheduled Meds: . azithromycin  500 mg Oral Once per day on Mon Wed Fri  . budesonide-formoterol  2 puff Inhalation BID  . docusate sodium  100 mg Oral BID  . enoxaparin (LOVENOX) injection  30 mg Subcutaneous Q24H  . ethambutol  2,400 mg Oral Once per day on Mon Wed Fri  . fentaNYL   Intravenous 6 times per day  . fluticasone  1 spray Each [name] Daily  . furosemide  40 mg Oral Daily  . insulin aspart  0-15 Units Subcutaneous TID WC  . labetalol  200 mg Oral BID  . linagliptin  5 mg Oral Daily  . montelukast  10 mg Oral QHS  . potassium citrate  10  mEq Oral Daily  . predniSONE  20 mg Oral Daily  . rifampin  600 mg Oral Once per day on Mon Wed Fri  . senna  1 tablet Oral BID   Continuous Infusions: . dextrose 5 % and 0.45 % NaCl with KCl 20 mEq/L 10 mL/hr at 06/20/14 0700   PRN Meds:.albuterol, diphenhydrAMINE **OR** diphenhydrAMINE, naloxone **AND** sodium chloride, ondansetron (ZOFRAN) IV, oxyCODONE, polyethylene glycol  Xrays Dg Chest Port 1 View  06/20/2014   CLINICAL DATA:  Chest tube placement for pneumothorax  EXAM: PORTABLE CHEST - 1 VIEW  COMPARISON:  Jun 19, 2014  FINDINGS: Chest tube remains on the right. No pneumothorax apparent. There is atelectatic change in both mid lung regions, stable. There is no appreciable edema or consolidation. Heart is enlarged but stable. The pulmonary vascular is within normal limits. No adenopathy.  IMPRESSION: Stable midlung atelectasis bilaterally. No change in right chest tube position. No pneumothorax apparent. No change in cardiomegaly.   Electronically Signed   By: Lowella Grip III M.D.   On: 06/20/2014 07:45   Dg Chest Port 1 View  06/19/2014   CLINICAL DATA:  Chest tube placement for right-sided pneumothorax  EXAM: PORTABLE CHEST - 1 VIEW  COMPARISON:  Jun 18, 2014  FINDINGS: Chest tube remains on the right without change. No pneumothorax apparent. There is patchy atelectasis in both mid lungs. There is no edema or consolidation. Heart is enlarged with pulmonary vascularity within normal limits. No adenopathy.  IMPRESSION: No apparent pneumothorax atelectasis both mid lungs. No consolidation or edema. Stable cardiomegaly.   Electronically Signed   By: Lowella Grip III M.D.   On: 06/19/2014 08:09    Assessment/Plan: S/P right chest tube for pneumothorax, no air leak, CXR is stable- d/c today ID/pulmonology assisting with management   LOS: 4 days    GOLD,WAYNE E 06/20/2014  patient examined and medical record reviewed,agree with above note. Tharon Aquas Trigt  III 06/20/2014

## 2014-06-20 NOTE — Progress Notes (Signed)
Paged Evonnie Pat, PA at [phone]. DC CT with no complications, stat cxr complete. SpO2 93-86% RA, placed on 2LPM s/p CT removal SpO2 96. Will continue to monitor.

## 2014-06-20 NOTE — Progress Notes (Signed)
.   Name: Stacey Page MRN: [id number] DOB: 02-25-1950    ADMISSION DATE:  06/16/2014 CONSULTATION DATE:  06/16/2014  REFERRING MD :  Dr. Prescott Gum  CHIEF COMPLAINT:  SOB  BRIEF PATIENT DESCRIPTION: 64 year old female s/p VATs for biopsy in setting ILD 5/3, discharged 5/7. She presented again to ED 5/9 c/o SOB and R chest pain. CXR was done and showed 40% hydropneumothorax. Admitted to TCST, PCCM asked to assume care 5/10 AM.  SIGNIFICANT EVENTS  5/3 R VATs for biopsy >> scattered nonnecrotizing granulomas ,chronic hypersens pneumonitis, atypical mycobacteria  STUDIES:  Echo 01/28/14 >> severe LVH, EF 65 to [id number], grade 2 diastolic dysfx, mild AS, mild/mod LA. HST 02/12/14 >> AHI 59.3, SaO2 low 67% Auto CPAP 03/07/14 to 03/09/14 >> used on 3 of 3 nights with average 8 hrs and 8 min. Average AHI is 3.3 with median CPAP 12 cm H2O and 95 th percentile CPAP 14 cm H20. PFT 03/12/14 >> FEV1 2.05 (88%), FEV1% 87, TLC 4.14 (75%), DLCO 52%, no BD CT chest 03/20/14 >> borderline LAN, several scattered nodules up to 7 mm, b/l patchy GGO, air-trapping on inspiratory/expiratory images   SUBJECTIVE:  Afebrile Pain improved No dyspnea   VITAL SIGNS: Temp:  [97.8 F (36.6 C)-98.6 F (37 C)] 98.6 F (37 C) (05/13 1100) Pulse Rate:  [64-74] 67 (05/13 0732) Resp:  [18-27] 18 (05/13 0800) BP: (103-135)/(40-59) 135/59 mmHg (05/13 0732) SpO2:  [91 %-99 %] 98 % (05/13 1039)  PHYSICAL EXAMINATION: General:  Morbidly obese female in NAD Neuro:  Alert, oriented, non-focal HEENT:  [place]/AT no JVD, PERRL Cardiovascular:  RRR, no MRG Lungs:  Clear bilateral breath sounds. Rt chest tube in place, no air leak.  Abdomen:  Soft, non-tender, non-distended Musculoskeletal:  No acute deformity or ROM limitation Skin:  Grossly intact   Recent Labs Lab 06/14/14 0423 06/16/14 1733 06/16/14 2150  NA 135 134* 135  K 4.3 4.3 4.7  CL 100* 98* 98*  CO2 29 25 23   BUN 28* 22* 23*  CREATININE 1.48* 1.61*  1.52*  GLUCOSE 100* 128* 148*    Recent Labs Lab 06/16/14 1733 06/16/14 2150  HGB 11.5* 11.3*  HCT 36.3 35.4*  WBC 10.9* 9.8  PLT 276 273   Dg Chest Port 1 View  06/20/2014   CLINICAL DATA:  Chest tube placement for pneumothorax  EXAM: PORTABLE CHEST - 1 VIEW  COMPARISON:  Jun 19, 2014  FINDINGS: Chest tube remains on the right. No pneumothorax apparent. There is atelectatic change in both mid lung regions, stable. There is no appreciable edema or consolidation. Heart is enlarged but stable. The pulmonary vascular is within normal limits. No adenopathy.  IMPRESSION: Stable midlung atelectasis bilaterally. No change in right chest tube position. No pneumothorax apparent. No change in cardiomegaly.   Electronically Signed   By: Lowella Grip III M.D.   On: 06/20/2014 07:45   Dg Chest Port 1 View  06/19/2014   CLINICAL DATA:  Chest tube placement for right-sided pneumothorax  EXAM: PORTABLE CHEST - 1 VIEW  COMPARISON:  Jun 18, 2014  FINDINGS: Chest tube remains on the right without change. No pneumothorax apparent. There is patchy atelectasis in both mid lungs. There is no edema or consolidation. Heart is enlarged with pulmonary vascularity within normal limits. No adenopathy.  IMPRESSION: No apparent pneumothorax atelectasis both mid lungs. No consolidation or edema. Stable cardiomegaly.   Electronically Signed   By: Lowella Grip III M.D.   On: 06/19/2014 08:09  ASSESSMENT / PLAN:  R sided pneumothorax - CT by TCTS, to water seal,  discontinue today   OSA on CPAP - Continue CPAP during sleep  ILD - Biopsy 5/3 > pathology >>  atypical mycobacteria  Nonnecrotizing granulomatous base suggest sarcoidosis or hypersensitivity pneumonitis - Continue preadmission regimen ( albuterol, symbicort, singulair) -ct pred 20 mg - ID input appreciated, radiographically this would be quite atypical for tuberculosis. Agree with PCR. She has been started on empiric therapy for atypical  mycobacteria  HTN Chronic dCHF - Continue home labetalol, furosemide Hold lisinopril   Kara Mead MD. FCCP. [facility] Pager [phone] If no response call [phone]    06/20/2014, 1:14 PM

## 2014-06-20 NOTE — Progress Notes (Signed)
INFECTIOUS DISEASE PROGRESS NOTE  ID: Stacey Page is a 64 y.o. female with  Active Problems:   Pneumothorax   Chest tube in place   OSA on CPAP   ILD (interstitial lung disease)   Pneumothorax, right  Subjective: Without complaints, "I'm done coughing"  Abtx:  Anti-infectives    Start     Dose/Rate Route Frequency Ordered Stop   06/20/14 1000  azithromycin (ZITHROMAX) tablet 500 mg     500 mg Oral Once per day on Mon Wed Fri 06/19/14 1353     06/20/14 1000  rifampin (RIFADIN) capsule 600 mg     600 mg Oral Once per day on Mon Wed Fri 06/19/14 1353     06/20/14 1000  ethambutol (MYAMBUTOL) tablet 2,400 mg     2,400 mg Oral Once per day on Mon Wed Fri 06/19/14 1353     06/19/14 1530  azithromycin (ZITHROMAX) tablet 500 mg     500 mg Oral  Once 06/19/14 1417 06/19/14 1655   06/19/14 1530  ethambutol (MYAMBUTOL) tablet 2,400 mg     2,400 mg Oral  Once 06/19/14 1418 06/19/14 1655   06/19/14 1530  rifampin (RIFADIN) capsule 600 mg     600 mg Oral  Once 06/19/14 1419 06/19/14 1655      Medications:  Scheduled: . azithromycin  500 mg Oral Once per day on Mon Wed Fri  . budesonide-formoterol  2 puff Inhalation BID  . docusate sodium  100 mg Oral BID  . enoxaparin (LOVENOX) injection  30 mg Subcutaneous Q24H  . ethambutol  2,400 mg Oral Once per day on Mon Wed Fri  . fentaNYL   Intravenous 6 times per day  . fluticasone  1 spray Each [name] Daily  . furosemide  40 mg Oral Daily  . insulin aspart  0-15 Units Subcutaneous TID WC  . labetalol  200 mg Oral BID  . linagliptin  5 mg Oral Daily  . montelukast  10 mg Oral QHS  . potassium citrate  10 mEq Oral Daily  . predniSONE  20 mg Oral Daily  . rifampin  600 mg Oral Once per day on Mon Wed Fri  . senna  1 tablet Oral BID    Objective: Vital signs in last 24 hours: Temp:  [97.8 F (36.6 C)-98.6 F (37 C)] 98.6 F (37 C) (05/13 1100) Pulse Rate:  [64-74] 67 (05/13 0732) Resp:  [18-27] 18 (05/13 0800) BP:  (103-135)/(40-59) 135/59 mmHg (05/13 0732) SpO2:  [91 %-99 %] 98 % (05/13 1039)   General appearance: alert, cooperative and no distress Resp: rhonchi bibasilar Cardio: regular rate and rhythm GI: normal findings: bowel sounds normal and soft, non-tender  Lab Results No results for input(s): WBC, HGB, HCT, NA, K, CL, CO2, BUN, CREATININE, GLU in the last 72 hours.  Invalid input(s): PLATELETS Liver Panel No results for input(s): PROT, ALBUMIN, AST, ALT, ALKPHOS, BILITOT, BILIDIR, IBILI in the last 72 hours. Sedimentation Rate No results for input(s): ESRSEDRATE in the last 72 hours. C-Reactive Protein No results for input(s): CRP in the last 72 hours.  Microbiology: Recent Results (from the past 240 hour(s))  MRSA PCR Screening     Status: None   Collection Time: 06/16/14 10:50 PM  Result Value Ref Range Status   MRSA by PCR NEGATIVE NEGATIVE Final    Comment:        The GeneXpert MRSA Assay (FDA approved for NASAL specimens only), is one component of a comprehensive MRSA colonization surveillance  program. It is not intended to diagnose MRSA infection nor to guide or monitor treatment for MRSA infections.     Studies/Results: Dg Chest 1v Repeat Same Day  06/20/2014   CLINICAL DATA:  Chest tube removal  EXAM: CHEST - 1 VIEW SAME DAY  COMPARISON:  Radiograph 06/20/2014  FINDINGS: Stable enlarged cardiac silhouette. Interval removal of right chest with no appreciable pneumothorax. There is a band of atelectasis in the right mid lung and right lateral pleural thickening not changed from prior.  IMPRESSION: No pneumothorax evident following right chest tube removal.   Electronically Signed   By: Stacey Page M.D.   On: 06/20/2014 13:22   Dg Chest Port 1 View  06/20/2014   CLINICAL DATA:  Chest tube placement for pneumothorax  EXAM: PORTABLE CHEST - 1 VIEW  COMPARISON:  Jun 19, 2014  FINDINGS: Chest tube remains on the right. No pneumothorax apparent. There is atelectatic  change in both mid lung regions, stable. There is no appreciable edema or consolidation. Heart is enlarged but stable. The pulmonary vascular is within normal limits. No adenopathy.  IMPRESSION: Stable midlung atelectasis bilaterally. No change in right chest tube position. No pneumothorax apparent. No change in cardiomegaly.   Electronically Signed   By: Stacey Page III M.D.   On: 06/20/2014 07:45   Dg Chest Port 1 View  06/19/2014   CLINICAL DATA:  Chest tube placement for right-sided pneumothorax  EXAM: PORTABLE CHEST - 1 VIEW  COMPARISON:  Jun 18, 2014  FINDINGS: Chest tube remains on the right without change. No pneumothorax apparent. There is patchy atelectasis in both mid lungs. There is no edema or consolidation. Heart is enlarged with pulmonary vascularity within normal limits. No adenopathy.  IMPRESSION: No apparent pneumothorax atelectasis both mid lungs. No consolidation or edema. Stable cardiomegaly.   Electronically Signed   By: Stacey Page III M.D.   On: 06/19/2014 08:09     Assessment/Plan: [name] DM2 with CRI 3 Obesity Total days of antibiotics: 1 E/R/A  Will see her back in ID clinic Will f/u her quantiferon Will f/u her path pcr.          Stacey Page Infectious Diseases (pager) [phone] [email or web address] 06/20/2014, 2:05 PM  LOS: 4 days

## 2014-06-20 NOTE — Discharge Summary (Signed)
[address]       [place] [address]             [phone]              Discharge Summary  Name: Stacey Page DOB: 11/15/1950 64 y.o. MRN: [id number]   Admission Date: 06/16/2014 Discharge Date:     Admitting Diagnosis: Right pneumothorax   Discharge Diagnosis:  Right pneumothorax Atypical mycobacteria, right lung  Past Medical History  Diagnosis Date  . HTN (hypertension)   . Asthma   . Seasonal allergies   . OA (osteoarthritis)   . Interstitial lung disease 03/26/2014  . OSA (obstructive sleep apnea) 02/25/2014    wears CPAP at night  . Anxiety   . Depression   . Diabetes     Type 2  . Acute renal failure May 19, 2014  . GERD (gastroesophageal reflux disease)   . Swelling of ankle     Bilateral; takes Lasix  . Aortic stenosis, mild     mild AS 01/2014 echo  . Shortness of breath on exertion     Procedures: Right chest tube placement - 06/16/2014    HPI:  The patient is a 64 y.o. female who was recently admitted to [facility] from 06/10/2014-06/14/2014.  She underwent a right VATS with lung biopsies by Dr. Prescott Gum during that admission for presumed interstitial lung disease. Postoperatively, her course was uneventful and she was discharged home in good condition.  She did well at home initially, but developed shortness of breath and chest pain on 06/16/2014, which persisted despite use of inhalers, pain medication and CPAP.  She presented to the ER for further evaluation and was noted on chest x-ray to have a 50% right pneumothorax.  Dr. Servando Snare saw the patient in the ER and placed a right chest tube at the bedside.  She was admitted for chest tube management.    Hospital Course:  The patient was admitted to [facility] on 06/16/2014.  Follow up chest x-ray showed resolution of her pneumothorax.  Chest tube was managed conservatively and was slowly weaned from suction. She remained stable and her pneumothorax did not recur.  The  chest tube was removed on 06/20/2014.   During this admission, her final pathology from the lung biopsy was resulted and showed bronchiolitis with scattered non-necrotizing granulomatous inflammation and rare AFB, most consistent with atypical mycobacteria.  Infectious disease was consulted for treatment recommendations.  It was felt that she should be treated empirically for MAI while awaiting the results of further speciation from pathology.   The patient has overall remained stable during admission.  Blood pressures and glucoses have been controlled with her home medication regimen. Incisions are healing well.  She is tolerating a regular diet and is ambulating in the halls without difficulty.  Chest x-rays have remained stable with no recurrence of pneumothorax.  The patient is presently medically stable for discharge home on today's date.     Recent vital signs:  Filed Vitals:   06/22/14 0402  BP: 144/81  Pulse: 70  Temp: 99.2 F (37.3 C)  Resp: 21    Recent laboratory studies:  CBC:No results for input(s): WBC, HGB, HCT, PLT in the last 72 hours. BMET: No results for input(s): NA, K, CL, CO2, GLUCOSE, BUN, CREATININE, CALCIUM in the last 72 hours.  PT/INR: No results for input(s): LABPROT, INR in the last 72 hours.   Discharge Medications:     Medication  List    STOP taking these medications        oxyCODONE 5 MG immediate release tablet  Commonly known as:  Oxy IR/ROXICODONE      TAKE these medications        albuterol 108 (90 BASE) MCG/ACT inhaler  Commonly known as:  PROAIR HFA  Inhale 2 puffs into the lungs every 6 (six) hours as needed for wheezing or shortness of breath.     azithromycin 500 MG tablet  Commonly known as:  ZITHROMAX  Take 1 tablet (500 mg total) by mouth 3 (three) times a week.     budesonide-formoterol 160-4.5 MCG/ACT inhaler  Commonly known as:  SYMBICORT  Inhale 2 puffs into the lungs 2 (two) times daily.     CLOBETASOL PROPIONATE E 0.05  % emollient cream  Generic drug:  Clobetasol Prop Emollient Base  Apply 1 application topically daily as needed (skin irritations).     ethambutol 400 MG tablet  Commonly known as:  MYAMBUTOL  Take 6 tablets (2,400 mg total) by mouth 3 (three) times a week. On Monday-Wednesday-Friday     fluticasone 50 MCG/ACT nasal spray  Commonly known as:  FLONASE  Place 1 spray into both nostrils daily.     furosemide 40 MG tablet  Commonly known as:  LASIX  Take 1 tablet (40 mg total) by mouth daily.     HYDROcodone-acetaminophen 5-325 MG per tablet  Commonly known as:  NORCO/VICODIN  Take 1-2 tablets by mouth every 4 (four) hours as needed for moderate pain.     labetalol 200 MG tablet  Commonly known as:  NORMODYNE  Take 1 tablet (200 mg total) by mouth 2 (two) times daily.     lisinopril 20 MG tablet  Commonly known as:  PRINIVIL,ZESTRIL  Take 1 tablet (20 mg total) by mouth daily.     montelukast 10 MG tablet  Commonly known as:  SINGULAIR  Take 10 mg by mouth at bedtime.     potassium citrate 10 MEQ (1080 MG) SR tablet  Commonly known as:  UROCIT-K  Take 1 tablet (10 mEq total) by mouth daily.     predniSONE 20 MG tablet  Commonly known as:  DELTASONE  Take 1 tablet (20 mg total) by mouth daily. Taper down to 20mg  and hold at this dose until next office visit     rifampin 300 MG capsule  Commonly known as:  RIFADIN  Take 2 capsules (600 mg total) by mouth 3 (three) times a week.     sitaGLIPtin 100 MG tablet  Commonly known as:  JANUVIA  Take 1 tablet (100 mg total) by mouth daily.         Discharge Instructions:  The patient is to refrain from driving, heavy lifting or strenuous activity.  May shower daily and clean incisions with soap and water.  May resume regular diet.   Follow Up: Follow-up Information    Follow up with Ivin Poot III, MD On 07/02/2014.   Specialty:  Cardiothoracic Surgery   Why:  Have a chest x-ray at [place] at 1:15, then see  MD at 2:15   Contact information:   [address] [place] [address] [phone]       Follow up with Chesley Mires, MD.   Specialty:  Pulmonary Disease   Why:  Follow up as directed   Contact information:   [address] [place] [address] [phone]       Follow  up with Bobby Rumpf, MD.   Specialty:  Infectious Diseases   Why:  Office will contact you with an appointment   Contact information:   [place] [address] [phone]         [place] 06/22/2014, 10:37 AM

## 2014-06-20 NOTE — Discharge Instructions (Signed)
Please continue your regular diet. You may walk daily. No heavy lifting or strenuous activity. Shower daily and clean incisions with soap and water.

## 2014-06-21 ENCOUNTER — Inpatient Hospital Stay (HOSPITAL_COMMUNITY): Payer: PPO

## 2014-06-21 DIAGNOSIS — G4733 Obstructive sleep apnea (adult) (pediatric): Secondary | ICD-10-CM

## 2014-06-21 DIAGNOSIS — J849 Interstitial pulmonary disease, unspecified: Secondary | ICD-10-CM

## 2014-06-21 DIAGNOSIS — A319 Mycobacterial infection, unspecified: Secondary | ICD-10-CM

## 2014-06-21 LAB — GLUCOSE, CAPILLARY
Glucose-Capillary: 106 mg/dL — ABNORMAL HIGH (ref 65–99)
Glucose-Capillary: 140 mg/dL — ABNORMAL HIGH (ref 65–99)
Glucose-Capillary: 107 mg/dL — ABNORMAL HIGH (ref 65–99)
Glucose-Capillary: 96 mg/dL (ref 65–99)

## 2014-06-21 NOTE — Progress Notes (Addendum)
       [address]       [place] [address]             [phone]               Subjective: Feels ok.  Some cough, but overall breathing stable.    Objective: Vital signs in last 24 hours: Patient Vitals for the past 24 hrs:  BP Temp Temp src Pulse Resp SpO2  06/21/14 0800 - - - - (!) 21 97 %  06/21/14 0748 - 98.3 F (36.8 C) Oral - - -  06/21/14 0740 (!) 157/79 mmHg - - 69 (!) 24 96 %  06/21/14 0431 (!) 127/44 mmHg 98.5 F (36.9 C) Oral 65 19 96 %  06/20/14 2330 119/67 mmHg 98.6 F (37 C) Oral 68 20 93 %  06/20/14 1958 - - - - - 96 %  06/20/14 1932 101/64 mmHg 98 F (36.7 C) Oral 74 20 98 %  06/20/14 1600 - - - - (!) 21 98 %  06/20/14 1500 - 98 F (36.7 C) Oral - - -  06/20/14 1420 (!) 106/49 mmHg - - 70 (!) 25 93 %  06/20/14 1200 - - - - 18 99 %  06/20/14 1111 (!) 112/24 mmHg - - 66 20 93 %  06/20/14 1100 - 98.6 F (37 C) Oral - - -  06/20/14 1039 - - - - - 98 %   Current Weight  06/16/14 338 lb 10 oz (153.6 kg)     Intake/Output from previous day: 05/13 0701 - 05/14 0700 In: 290 [I.V.:290] Out: 0     PHYSICAL EXAM:  Heart: RRR Lungs: Diminished BS bilaterally Wound: Incisions healing well, CT sites clean and dry     Lab Results: CBC:No results for input(s): WBC, HGB, HCT, PLT in the last 72 hours. BMET: No results for input(s): NA, K, CL, CO2, GLUCOSE, BUN, CREATININE, CALCIUM in the last 72 hours.  PT/INR: No results for input(s): LABPROT, INR in the last 72 hours.    Assessment/Plan: S/P right chest tube for pneumothorax - CT out, CXR not done yet this am. Will follow up. Probable MAI- ID following and treatment started. D/c PCA, saline lock IVF.  Hopefully home soon.   LOS: 5 days    COLLINS,GINA H 06/21/2014   Chart reviewed, patient examined, agree with above. CXR ok.

## 2014-06-21 NOTE — Care Management Note (Signed)
Case Management Note  Patient Details  Name: Marjoria Miyashiro MRN: [id number] Date of Birth: 24-Nov-1950 Subjective/Objective:           Pt from home  readmitted with a pneumothorax , s/p  Vat's procedure with biopsies on 5/3.       Action/Plan: Return to home when medically stable. CM to f/u with discharge needs.  Expected Discharge Date:                  Expected Discharge Plan:  Home/Self Care  In-House Referral:     Discharge planning Services  CM Consult  Post Acute Care Choice:    Choice offered to:     DME Arranged:    DME Agency:     HH Arranged:    HH Agency:     Status of Service:  In process, will continue to follow  Medicare Important Message Given:    Date Medicare IM Given:    Medicare IM give by:    Date Additional Medicare IM Given:    Additional Medicare Important Message give by:     If discussed at [place] of Stay Meetings, dates discussed:    Additional Comments:   Emergency contact : Verdie Mosher [phone] , [facility] [place](daughter) - [phone] --Sharin Mons, RN 06/21/2014, 6:27 PM -

## 2014-06-21 NOTE — Progress Notes (Signed)
.   Name: Stacey Page MRN: [id number] DOB: May 11, 1950    ADMISSION DATE:  06/16/2014 CONSULTATION DATE:  06/16/2014  REFERRING MD :  Dr. Prescott Gum  CHIEF COMPLAINT:  SOB  BRIEF PATIENT DESCRIPTION: 64 year old female s/p VATs for biopsy in setting ILD 5/3, discharged 5/7. She presented again to ED 5/9 c/o SOB and R chest pain. CXR was done and showed 40% hydropneumothorax. Admitted to [facility], PCCM asked to assume care 5/10 AM.  SIGNIFICANT EVENTS  5/3 R VATs for biopsy >> scattered nonnecrotizing granulomas ,chronic hypersens pneumonitis, atypical mycobacteria  STUDIES:  Echo 01/28/14 >> severe LVH, EF 65 to [id number], grade 2 diastolic dysfx, mild AS, mild/mod LA. HST 02/12/14 >> AHI 59.3, SaO2 low 67% Auto CPAP 03/07/14 to 03/09/14 >> used on 3 of 3 nights with average 8 hrs and 8 min. Average AHI is 3.3 with median CPAP 12 cm H2O and 95 th percentile CPAP 14 cm H20. PFT 03/12/14 >> FEV1 2.05 (88%), FEV1% 87, TLC 4.14 (75%), DLCO 52%, no BD CT chest 03/20/14 >> borderline LAN, several scattered nodules up to 7 mm, b/l patchy GGO, air-trapping on inspiratory/expiratory images   SUBJECTIVE:  Stable overnight.  Chest tube out with no ptx on cxr.  VITAL SIGNS: Temp:  [98 F (36.7 C)-98.6 F (37 C)] 98.3 F (36.8 C) (05/14 0748) Pulse Rate:  [65-74] 72 (05/14 1002) Resp:  [18-25] 21 (05/14 0800) BP: (101-157)/(33-79) 119/33 mmHg (05/14 1002) SpO2:  [93 %-99 %] 97 % (05/14 0800)  PHYSICAL EXAMINATION: General:  Morbidly obese female in NAD Neuro:  Alert, oriented, moves all4 HEENT:  Nose without purulence, neck with TMG Cardiovascular:  RRR, no MRG Lungs:  A few basilar crackles, but good bs bilat Abdomen:  Soft, non-tender, non-distended LE without edema, no cyanosis   Recent Labs Lab 06/16/14 1733 06/16/14 2150  NA 134* 135  K 4.3 4.7  CL 98* 98*  CO2 25 23  BUN 22* 23*  CREATININE 1.61* 1.52*  GLUCOSE 128* 148*    Recent Labs Lab 06/16/14 1733 06/16/14 2150  HGB  11.5* 11.3*  HCT 36.3 35.4*  WBC 10.9* 9.8  PLT 276 273   Dg Chest 2 View  06/21/2014   CLINICAL DATA:  Followup exam.  Right-sided pneumothorax.  EXAM: CHEST  2 VIEW  COMPARISON:  06/20/2014.  FINDINGS: No pneumothorax.  Cardiac silhouette mildly enlarged. No mediastinal or hilar masses. There is linear and discoid opacity extending laterally from the hila and in the medial lung bases consistent with atelectasis. This is similar to the previous day's study. No convincing pneumonia. No pulmonary edema. No pleural effusion.  IMPRESSION: 1. No acute cardiopulmonary disease. 2. No pneumothorax. 3. Perihilar and lung base atelectasis, similar to the previous exam.   Electronically Signed   By: Lajean Manes M.D.   On: 06/21/2014 09:13   Dg Chest 1v Repeat Same Day  06/20/2014   CLINICAL DATA:  Chest tube removal  EXAM: CHEST - 1 VIEW SAME DAY  COMPARISON:  Radiograph 06/20/2014  FINDINGS: Stable enlarged cardiac silhouette. Interval removal of right chest with no appreciable pneumothorax. There is a band of atelectasis in the right mid lung and right lateral pleural thickening not changed from prior.  IMPRESSION: No pneumothorax evident following right chest tube removal.   Electronically Signed   By: Suzy Bouchard M.D.   On: 06/20/2014 13:22   Dg Chest Port 1 View  06/20/2014   CLINICAL DATA:  Chest tube placement for pneumothorax  EXAM: PORTABLE CHEST - 1 VIEW  COMPARISON:  Jun 19, 2014  FINDINGS: Chest tube remains on the right. No pneumothorax apparent. There is atelectatic change in both mid lung regions, stable. There is no appreciable edema or consolidation. Heart is enlarged but stable. The pulmonary vascular is within normal limits. No adenopathy.  IMPRESSION: Stable midlung atelectasis bilaterally. No change in right chest tube position. No pneumothorax apparent. No change in cardiomegaly.   Electronically Signed   By: Lowella Grip III M.D.   On: 06/20/2014 07:45    ASSESSMENT /  PLAN:  R sided pneumothorax - resolved   OSA on CPAP - Continue CPAP during sleep  ILD - Biopsy 5/3 > pathology >>  atypical mycobacteria  Nonnecrotizing granulomatous base suggest sarcoidosis or hypersensitivity pneumonitis, however mycobacterial disease itself can cause a granulomatous process that can be non-necrotizing.  - Continue preadmission regimen ( albuterol, symbicort, singulair) -ct pred 20 mg -  radiographically this would be quite atypical for tuberculosis. Agree with PCR. She has been started on empiric therapy for atypical mycobacteria  Will see again on Monday.  Please call if questions this weekend

## 2014-06-21 NOTE — Progress Notes (Signed)
When patient laying in bed, on RA, sats good 95-100%.  Desats with activity and gets SOB, takes a few minutes to recover.  Ambulated on RA, sats dropped to 80%. 3L placed and sats increased to 90-92%. Pt RR 30-40s while ambulating. Had to take multiple breaks during ambulation.

## 2014-06-21 NOTE — Progress Notes (Signed)
Fentanyl PCA discontinued. 47ml= 400 mcg of Fentanyl wasted. Witnessed by Ardeth Perfect, RN.

## 2014-06-22 LAB — GLUCOSE, CAPILLARY
Glucose-Capillary: 125 mg/dL — ABNORMAL HIGH (ref 65–99)
Glucose-Capillary: 117 mg/dL — ABNORMAL HIGH (ref 65–99)

## 2014-06-22 LAB — QUANTIFERON IN TUBE
QFT TB AG MINUS NIL VALUE: 0 [IU]/mL
QUANTIFERON MITOGEN VALUE: 0.07 [IU]/mL
QUANTIFERON NIL VALUE: 0.02 [IU]/mL
QUANTIFERON TB AG VALUE: 0.02 IU/mL
QUANTIFERON TB GOLD: UNDETERMINED

## 2014-06-22 LAB — QUANTIFERON TB GOLD ASSAY (BLOOD)

## 2014-06-22 MED ORDER — PREDNISONE 20 MG PO TABS: 20.0000 mg | ORAL_TABLET | Freq: Every day | ORAL | Status: DC

## 2014-06-22 MED ORDER — HYDROCODONE-ACETAMINOPHEN 5-325 MG PO TABS
1.0000 | ORAL_TABLET | ORAL | Status: DC | PRN
  Administered 2014-06-22: 2 via ORAL
  Filled 2014-06-22: qty 2

## 2014-06-22 MED ORDER — ETHAMBUTOL HCL 400 MG PO TABS: 2400.0000 mg | ORAL_TABLET | ORAL | Status: DC

## 2014-06-22 MED ORDER — AZITHROMYCIN 500 MG PO TABS: 500.0000 mg | ORAL_TABLET | ORAL | Status: DC

## 2014-06-22 MED ORDER — HYDROCODONE-ACETAMINOPHEN 5-325 MG PO TABS: 1.0000 | ORAL_TABLET | ORAL | Status: DC | PRN

## 2014-06-22 MED ORDER — RIFAMPIN 300 MG PO CAPS: 600.0000 mg | ORAL_CAPSULE | ORAL | Status: DC

## 2014-06-22 NOTE — Progress Notes (Signed)
Patient has home CPAP and is able to place herself on/off as needed.  Patient encouraged to call for assistance if needed.

## 2014-06-22 NOTE — Progress Notes (Addendum)
       [address]       [place] [address]             [phone]               Subjective: C/o "severe pain" in right chest, hip and leg.  Breathing stable.  Objective: Vital signs in last 24 hours: Patient Vitals for the past 24 hrs:  BP Temp Temp src Pulse Resp SpO2  06/22/14 0402 (!) 144/81 mmHg 99.2 F (37.3 C) Oral 70 (!) 21 91 %  06/22/14 0011 (!) 151/46 mmHg 98.7 F (37.1 C) Oral 67 (!) 21 93 %  06/21/14 2000 (!) 156/66 mmHg - - 72 16 95 %  06/21/14 1957 - 97.5 F (36.4 C) Oral - - -  06/21/14 1942 - - - - - 100 %  06/21/14 1551 (!) 141/57 mmHg 98.5 F (36.9 C) Oral 71 20 94 %  06/21/14 1102 113/81 mmHg - - 65 17 100 %  06/21/14 1100 - 97.5 F (36.4 C) Oral - - -  06/21/14 1002 (!) 119/33 mmHg - - 72 - -  06/21/14 0800 - - - - (!) 21 97 %   Current Weight  06/16/14 338 lb 10 oz (153.6 kg)     Intake/Output from previous day: 05/14 0701 - 05/15 0700 In: 1440 [P.O.:1320; I.V.:120] Out: -     PHYSICAL EXAM:  Heart: RRR Lungs: Few coarse BS in bases Wound: Clean and dry     Lab Results: CBC:No results for input(s): WBC, HGB, HCT, PLT in the last 72 hours. BMET: No results for input(s): NA, K, CL, CO2, GLUCOSE, BUN, CREATININE, CALCIUM in the last 72 hours.  PT/INR: No results for input(s): LABPROT, INR in the last 72 hours.    Assessment/Plan: S/P right chest tube for pneumothorax - Stable from surgical standpoint, off O2. Pain control seems to be an issue.  Will switch po pain meds to Vicodin, which she states she has taken in the past. Probable MAI- ID following and treatment started. Home later today if pain controlled and otherwise stable.   LOS: 6 days    COLLINS,GINA H 06/22/2014   Chart reviewed, patient examined, agree with above. She feels better this afternoon and is planning to go home.

## 2014-06-24 ENCOUNTER — Telehealth: Payer: Self-pay | Admitting: Pulmonary Disease

## 2014-06-24 NOTE — Telephone Encounter (Signed)
Spoke with pt. States that her DME will not release her CPAP supplies until she sees a provider. She has been scheduled to see TP tomorrow at 10:45am. Nothing further was needed.

## 2014-06-25 ENCOUNTER — Ambulatory Visit (INDEPENDENT_AMBULATORY_CARE_PROVIDER_SITE_OTHER): Payer: PPO | Admitting: Adult Health

## 2014-06-25 ENCOUNTER — Encounter: Payer: Self-pay | Admitting: Adult Health

## 2014-06-25 ENCOUNTER — Ambulatory Visit (INDEPENDENT_AMBULATORY_CARE_PROVIDER_SITE_OTHER)
Admission: RE | Admit: 2014-06-25 | Discharge: 2014-06-25 | Disposition: A | Discharge status: Home / Self Care | Payer: PPO | Source: Ambulatory Visit | Attending: Adult Health | Admitting: Adult Health

## 2014-06-25 VITALS — BP 122/72 | HR 67 | Temp 98.1°F | Ht 68.0 in | Wt 339.8 lb

## 2014-06-25 DIAGNOSIS — G4733 Obstructive sleep apnea (adult) (pediatric): Secondary | ICD-10-CM | POA: Diagnosis not present

## 2014-06-25 DIAGNOSIS — J8410 Pulmonary fibrosis, unspecified: Secondary | ICD-10-CM

## 2014-06-25 DIAGNOSIS — J849 Interstitial pulmonary disease, unspecified: Secondary | ICD-10-CM

## 2014-06-25 DIAGNOSIS — J939 Pneumothorax, unspecified: Secondary | ICD-10-CM

## 2014-06-25 DIAGNOSIS — Z9989 Dependence on other enabling machines and devices: Secondary | ICD-10-CM

## 2014-06-25 NOTE — Assessment & Plan Note (Signed)
Cont on nocturnal CPAP  DME orders sent

## 2014-06-25 NOTE — Progress Notes (Signed)
Reviewed and agree with assessment/plan. 

## 2014-06-25 NOTE — Assessment & Plan Note (Signed)
Recent admit with PTX  Suture removed with dressing placed Wound care discussed with pt and daughter.  cxr today  Follow up with [facility] next week as planned

## 2014-06-25 NOTE — Assessment & Plan Note (Addendum)
Biopsy showed bronchiolitis with scattered nonnecrotizing granulomatous inflammation and rare acid fast bacilli on special stains. Most consistent with atypical mycobacteria. Patient was seen by infectious disease recommended to start on MAI treatment.. She was started on ethambutol, azithromycin and rifampin. For now with continue with MAI tx regimen  Workup with neg TB (quatiferon gold test neg) . HIV neg.  Previous Autoimmune positive for pANCA , Rheumatology referral pending.  It is possible this could represent other eitiology with sarcoid , Hypersensitivity Pneumonitis  Will have her follow up closely and see back in 3-4 weeks with Dr. Halford Chessman   Cont follow up with ID clinic for MAI therapy.

## 2014-06-25 NOTE — Progress Notes (Signed)
Subjective:    Patient ID: Stacey Page, female    DOB: 06/18/1950, 64 y.o.   MRN: [id number]  HPI Former smoker with dyspnea from asthma, diastolic heart failure, obesity with deconditioning.  She also has sleep apnea on CPAP   TESTS: Echo 01/28/14 >> severe LVH, EF 65 to [id number], grade 2 diastolic dysfx, mild AS, mild/mod LA. HST 02/12/14 >> AHI 59.3, SaO2 low 67% Auto CPAP 03/07/14 to 03/09/14 >> used on 3 of 3 nights with average 8 hrs and 8 min.  Average AHI is 3.3 with median CPAP 12 cm H2O and 95 th percentile CPAP 14 cm H20. PFT 03/12/14 >> FEV1 2.05 (88%), FEV1% 87, TLC 4.14 (75%), DLCO 52%, no BD  05/01/2014 Follow up Dyspnea /Asthma  Pt returns for follow up and to review test.  She underwent at CT chest HR on 2/15 that showed probable ILD, NSIP. Multiple scattered pulmonary nodules , largest of 6 mm .  Labs showed p ANCA positive (1:160 ) , rest of CTD /autoimmune workup neg.  She was started on Prednisone 40mg  daily  Has not noticed a change in her breathing/dyspnea since starting prednisone.  We discussed her test results. Discussed the possiblilty of lung biopsy.  She declines lung bx at this time, want to wait to until next ov to decide .  Denies sign cough. No rash, joint swelling.  No fevers, chest pain, orthopena, increased edema or hemoptysis. >referred for Bx   06/25/2014 Follow-up asthma, ILD, sleep apnea Patient presents for a follow-up for sleep apnea Needs rx for CPAP mask  And supplies .  Doing well on CPAP , wears everynight.   Patient is recently been found to have ILD, she underwent a biopsy on May 3  Biopsy showed bronchiolitis with scattered nonnecrotizing granulomatous inflammation and rare acid fast bacilli on special stains. Most consistent with atypical mycobacteria. Patient was seen by infectious disease recommended to start on MAI treatment.. She was started on ethambutol, azithromycin and rifampin. She was seen by pulmonary during her  hospitalization.  It was felt that the non-necrotizing granulomatous was suggestive of possible sarcoid versus hypersensitivity pneumonitis . However, Mycobacterium disease itself can calls a granulomatous process that can be nonnecrotizing. Quantiferon Gold was negative. HIV was nonreactive. She was continued on her prednisone 20 mg daily. Complains of pain along incision sites, says sutures are painful .  No redness or drainage, no fever.    Unfortunately, patient was admitted on May 9 with a 40% hydropneumothorax on the right She required a chest tube . PTX resolved and CT removed.  CXR prior to discharge showed no pneumothorax. Complains of pain along incision sites, says sutures are painful.  No redness or drainage, no fever.   Since discharge, she is feeling better but still weak.  Gets winded very easily . Wears out quickly .    Review of Systems  Constitutional:   No  weight loss, night sweats,  Fevers, chills, + fatigue, or  lassitude.  HEENT:   No headaches,  Difficulty swallowing,  Tooth/dental problems, or  Sore throat,                No sneezing, itching, ear ache, nasal congestion, post nasal drip,   CV:  No chest pain,  Orthopnea, PND, swelling in lower extremities, anasarca, dizziness, palpitations, syncope.   GI  No heartburn, indigestion, abdominal pain, nausea, vomiting, diarrhea, change in bowel habits, loss of appetite, bloody stools.   Resp:   No chest  wall deformity  Skin: no rash or lesions.  GU: no dysuria, change in color of urine, no urgency or frequency.  No flank pain, no hematuria   MS:  No joint pain or swelling.  No decreased range of motion.  No back pain.  Psych:  No change in mood or affect. No depression or anxiety.  No memory loss.          Objective:   Physical Exam GEN: A/Ox3; pleasant , NAD, morbidly obese   HEENT:  [place]/AT,  EACs-clear, TMs-wnl, NOSE-clear, THROAT-clear, no lesions, no postnasal drip or exudate noted.   NECK:   Supple w/ fair ROM; no JVD; normal carotid impulses w/o bruits; no thyromegaly or nodules palpated; no lymphadenopathy.  RESP  Decreased BS in bases , no accessory muscle use, no dullness to percussion Along right posterior thoracic with healing surgical incisons,  Along right lateral chest wall with well approximated incision /crusted area over suture , no redness, tender along area Along mid axillary lateral chest wall with suture noted pulling at skin with notable tenderness, no redness or drainage. , no dressing noted. >small opening noted-previous chest tube site.   CARD:  RRR, no m/r/g  , no peripheral edema, pulses intact, no cyanosis or clubbing.  GI:   Soft & nt; nml bowel sounds; no organomegaly or masses detected.  Musco: Warm bil, no deformities or joint swelling noted.   Neuro: alert, no focal deficits noted.    Skin: Warm, no lesions or rashes   Sutures  X 2 removed without difficulty .  vaseline gauze applied to mid axillary area covered with gauze.  Pt tolerated well.      Assessment & Plan:

## 2014-06-25 NOTE — Patient Instructions (Signed)
Chest xray today .  Continue on CPAP At bedtime  .  Follow up with thoracic surgeon next week as planned.  Follow up with ID clinic as planned  Follow up Dr. Halford Chessman  In 4 weeks and As needed   Watch area for signs  of redness .  Wound care as discussed  Please contact office for sooner follow up if symptoms do not improve or worsen or seek emergency care

## 2014-06-26 ENCOUNTER — Other Ambulatory Visit: Payer: Self-pay

## 2014-06-26 NOTE — Addendum Note (Signed)
Addended by: Parke Poisson E on: 06/26/2014 10:57 AM   Modules accepted: Orders

## 2014-06-26 NOTE — Patient Outreach (Signed)
New referral received today.  Placed call to patient who identified herself. Explained the [facility] program. Patient is interested.  Offered home visit for 5/24 at 10 am. Patient accepted.  Confirmed address. Provided my contact information.  PLAN:  Will see patient on 5/24 and completed assessments.  Tomasa Rand, RN, BSN, CEN [facility] [employer] [phone]

## 2014-06-26 NOTE — Patient Outreach (Signed)
[place] [facility] [facility]) Care Management  06/26/2014  Stacey Page 12-29-1950 [id number]   Referral received from [place], assigned Tomasa Rand, RN.  Stacey Page. [place], [place] Management [place] Assistant Phone: [phone] Fax: [phone]

## 2014-06-30 ENCOUNTER — Other Ambulatory Visit: Payer: Self-pay | Admitting: Cardiothoracic Surgery

## 2014-06-30 ENCOUNTER — Ambulatory Visit (INDEPENDENT_AMBULATORY_CARE_PROVIDER_SITE_OTHER): Payer: PPO | Admitting: Internal Medicine

## 2014-06-30 ENCOUNTER — Encounter: Payer: Self-pay | Admitting: Internal Medicine

## 2014-06-30 ENCOUNTER — Ambulatory Visit: Payer: PPO | Admitting: Internal Medicine

## 2014-06-30 VITALS — BP 110/72 | HR 84 | Temp 98.2°F | Resp 18 | Ht 68.0 in | Wt 338.0 lb

## 2014-06-30 DIAGNOSIS — I10 Essential (primary) hypertension: Secondary | ICD-10-CM | POA: Diagnosis not present

## 2014-06-30 DIAGNOSIS — A319 Mycobacterial infection, unspecified: Secondary | ICD-10-CM

## 2014-06-30 DIAGNOSIS — E0929 Drug or chemical induced diabetes mellitus with other diabetic kidney complication: Secondary | ICD-10-CM | POA: Diagnosis not present

## 2014-06-30 DIAGNOSIS — J939 Pneumothorax, unspecified: Secondary | ICD-10-CM

## 2014-06-30 MED ORDER — ALBUTEROL SULFATE HFA 108 (90 BASE) MCG/ACT IN AERS: 2.0000 | INHALATION_SPRAY | Freq: Four times a day (QID) | RESPIRATORY_TRACT | Status: DC | PRN

## 2014-06-30 MED ORDER — FLUTICASONE PROPIONATE 50 MCG/ACT NA SUSP: 1.0000 | Freq: Every day | NASAL | Status: DC

## 2014-06-30 NOTE — Assessment & Plan Note (Signed)
Will need appointment with ID for monitoring and treatment and repeat cultures. She is not positive for TB and HIV negative. Continue regimen of azithromycin, ethambutol, rifampin for now.

## 2014-06-30 NOTE — Assessment & Plan Note (Signed)
BP well controlled on lisinopril, labetalol, lasix. HR good. No symptoms from treatment.

## 2014-06-30 NOTE — Progress Notes (Signed)
   Subjective:    Patient ID: Stacey Page, female    DOB: [birth year], 63 y.o.   MRN: [id number]  HPI The patient is a 64 YO female who is coming in today to follow up on her diabetes. Since the last visit she has had several traumatic events including biopsy of her lung (to diagnose her ILD) and then resultant pneumohydrothorax with resultant chest tube. She is on prednisone for the ILD and is on 3 antibiotic regimen for newly found MAI. She is still taking her diabetes medicine and is not noticing high sugars. No hypoglycemia. She is not exercising although she is now breathing somewhat better and may be able to resume some light walking.   Review of Systems  Constitutional: Positive for activity change. Negative for fever, chills, appetite change, fatigue and unexpected weight change.  Respiratory: Positive for cough and shortness of breath. Negative for chest tightness and wheezing.   Cardiovascular: Negative for chest pain, palpitations and leg swelling.  Gastrointestinal: Negative for nausea, abdominal pain, diarrhea, constipation and abdominal distention.  Musculoskeletal: Positive for arthralgias.  Skin: Negative.   Neurological: Negative.       Objective:   Physical Exam  Constitutional: She is oriented to person, place, and time. She appears well-developed and well-nourished.  Overewight  HENT:  Head: Normocephalic and atraumatic.  Eyes: EOM are normal.  Cardiovascular: Normal rate.   Pulmonary/Chest: Effort normal. No respiratory distress. She has no wheezes. She has no rales.  Looked at spot where chest tube had been and clean with scab over the wound. Not infected and appears to be healing.   Abdominal: Soft. Bowel sounds are normal. She exhibits no distension. There is no tenderness. There is no rebound.  Musculoskeletal: She exhibits no edema.  Neurological: She is alert and oriented to person, place, and time. Coordination normal.  Skin: Skin is warm and dry.    Psychiatric: She has a normal mood and affect.   Filed Vitals:   06/30/14 1041  BP: 110/72  Pulse: 84  Temp: 98.2 F (36.8 C)  TempSrc: Oral  Resp: 18  Height: 5\' 8"  (1.727 m)  Weight: 338 lb (153.316 kg)  SpO2: 95%      Assessment & Plan:

## 2014-06-30 NOTE — Patient Instructions (Signed)
The wound looks like it is healing and keep the appointment this week with the surgeon.   Keep taking the medicine for the infection in the lungs and we will see how this does for the breathing.  We would like to see you back in about 2 months to check on the sugars since you are on the prednisone.   As you can it will be good to start walking and exercising as you are able.   Exercise to Stay Healthy Exercise helps you become and stay healthy. EXERCISE IDEAS AND TIPS Choose exercises that:  You enjoy.  Fit into your day. You do not need to exercise really hard to be healthy. You can do exercises at a slow or medium level and stay healthy. You can:  Stretch before and after working out.  Try yoga, Pilates, or tai chi.  Lift weights.  Walk fast, swim, jog, run, climb stairs, bicycle, dance, or rollerskate.  Take aerobic classes. Exercises that burn about 150 calories:  Running 1  miles in 15 minutes.  Playing volleyball for 45 to 60 minutes.  Washing and waxing a car for 45 to 60 minutes.  Playing touch football for 45 minutes.  Walking 1  miles in 35 minutes.  Pushing a stroller 1  miles in 30 minutes.  Playing basketball for 30 minutes.  Raking leaves for 30 minutes.  Bicycling 5 miles in 30 minutes.  Walking 2 miles in 30 minutes.  Dancing for 30 minutes.  Shoveling snow for 15 minutes.  Swimming laps for 20 minutes.  Walking up stairs for 15 minutes.  Bicycling 4 miles in 15 minutes.  Gardening for 30 to 45 minutes.  Jumping rope for 15 minutes.  Washing windows or floors for 45 to 60 minutes. Document Released: 02/26/2010 Document Revised: 04/18/2011 Document Reviewed: 02/26/2010 [facility] Patient Information 2015 [place], [place]. This information is not intended to replace advice given to you by your health care provider. Make sure you discuss any questions you have with your health care provider.

## 2014-06-30 NOTE — Progress Notes (Signed)
Pre visit review using our clinic review tool, if applicable. No additional management support is needed unless otherwise documented below in the visit note. 

## 2014-06-30 NOTE — Assessment & Plan Note (Signed)
Still on januvia and suspect she will need more treatment as her prednisone course continues. Will see her back in 2 months for HgA1c. Still does not want pneumonia shot.

## 2014-07-01 ENCOUNTER — Other Ambulatory Visit: Payer: Self-pay

## 2014-07-01 ENCOUNTER — Ambulatory Visit (INDEPENDENT_AMBULATORY_CARE_PROVIDER_SITE_OTHER): Payer: Self-pay | Admitting: Cardiothoracic Surgery

## 2014-07-01 ENCOUNTER — Telehealth: Payer: Self-pay | Admitting: Internal Medicine

## 2014-07-01 ENCOUNTER — Encounter: Payer: Self-pay | Admitting: Cardiothoracic Surgery

## 2014-07-01 ENCOUNTER — Ambulatory Visit
Admission: RE | Admit: 2014-07-01 | Discharge: 2014-07-01 | Disposition: A | Discharge status: Home / Self Care | Payer: PPO | Source: Ambulatory Visit | Attending: Cardiothoracic Surgery | Admitting: Cardiothoracic Surgery

## 2014-07-01 DIAGNOSIS — A319 Mycobacterial infection, unspecified: Secondary | ICD-10-CM

## 2014-07-01 DIAGNOSIS — J939 Pneumothorax, unspecified: Secondary | ICD-10-CM

## 2014-07-01 NOTE — Patient Outreach (Signed)
Left message for MD to inquire about CBG monitoring. Awaiting a call back.   Tomasa Rand, RN, BSN, CEN [facility] [employer] [phone]

## 2014-07-01 NOTE — Progress Notes (Signed)
PCP is Olga Millers, MD Referring Provider is Chesley Mires, MD  Chief Complaint  Patient presents with  . Routine Post Op    f/u from surgery with CXR, s/p RtVATS, right lung biopsies 06/10/14    [id number] returns for final office visit after right VATS for open lung biopsy. The patient was felt to have a interstitial lung disease. However pathology demonstrates atypical Mycobacterium and she is been started on triple antibiotic therapy for probably 6 months. Her VATS incisions are healing. She is not short of breath on room air. Chest x-ray today shows minimal postoperative atelectatic changes. She is having anxiety and postthoracotomy pain issues. She requests more Vicodin for her postthoracotomy pain.   Past Medical History  Diagnosis Date  . HTN (hypertension)   . Asthma   . Seasonal allergies   . OA (osteoarthritis)   . Interstitial lung disease 03/26/2014  . OSA (obstructive sleep apnea) 02/25/2014    wears CPAP at night  . Anxiety   . Depression   . Diabetes     Type 2  . Acute renal failure May 19, 2014  . GERD (gastroesophageal reflux disease)   . Swelling of ankle     Bilateral; takes Lasix  . Aortic stenosis, mild     mild AS 01/2014 echo  . Shortness of breath on exertion     Past Surgical History  Procedure Laterality Date  . Cesarean section  x2  . Polypectomy      throat  . Knee arthroscopy Right   . Video assisted thoracoscopy Right 06/10/2014    Procedure: VIDEO ASSISTED THORACOSCOPY;  Surgeon: Ivin Poot, MD;  Location: [facility];  Service: Thoracic;  Laterality: Right;  . Lung biopsy Right 06/10/2014    Procedure: LUNG BIOPSY;  Surgeon: Ivin Poot, MD;  Location: [facility];  Service: Thoracic;  Laterality: Right;    Family History  Problem Relation Age of Onset  . COPD Father   . Cancer Mother     Social History History  Substance Use Topics  . Smoking status: Former Smoker -- 0.25 packs/day for 15 years    Types: Cigarettes    Quit  date: 02/08/1980  . Smokeless tobacco: Not on file     Comment: 2 packs per week  . Alcohol Use: No     Comment: occassional/social/rare    Current Outpatient Prescriptions  Medication Sig Dispense Refill  . albuterol (PROAIR HFA) 108 (90 BASE) MCG/ACT inhaler Inhale 2 puffs into the lungs every 6 (six) hours as needed for wheezing or shortness of breath. 1 Inhaler 3  . azithromycin (ZITHROMAX) 500 MG tablet Take 1 tablet (500 mg total) by mouth 3 (three) times a week. 30 tablet 3  . budesonide-formoterol (SYMBICORT) 160-4.5 MCG/ACT inhaler Inhale 2 puffs into the lungs 2 (two) times daily.    . Clobetasol Prop Emollient Base (CLOBETASOL PROPIONATE E) 0.05 % emollient cream Apply 1 application topically daily as needed (skin irritations).     Marland Kitchen ethambutol (MYAMBUTOL) 400 MG tablet Take 6 tablets (2,400 mg total) by mouth 3 (three) times a week. On Monday-Wednesday-Friday 30 tablet 3  . fluticasone (FLONASE) 50 MCG/ACT nasal spray Place 1 spray into both nostrils daily. 16 g 3  . furosemide (LASIX) 40 MG tablet Take 1 tablet (40 mg total) by mouth daily. 90 tablet 1  . HYDROcodone-acetaminophen (NORCO/VICODIN) 5-325 MG per tablet Take 1-2 tablets by mouth every 4 (four) hours as needed for moderate pain. 30 tablet 0  .  labetalol (NORMODYNE) 200 MG tablet Take 1 tablet (200 mg total) by mouth 2 (two) times daily. 180 tablet 3  . lisinopril (PRINIVIL,ZESTRIL) 20 MG tablet Take 1 tablet (20 mg total) by mouth daily. 30 tablet 1  . montelukast (SINGULAIR) 10 MG tablet Take 10 mg by mouth at bedtime.    . potassium citrate (UROCIT-K) 10 MEQ (1080 MG) SR tablet Take 1 tablet (10 mEq total) by mouth daily. 90 tablet 3  . predniSONE (DELTASONE) 20 MG tablet Take 1 tablet (20 mg total) by mouth daily. Taper down to 20mg  and hold at this dose until next office visit 30 tablet 1  . rifampin (RIFADIN) 300 MG capsule Take 2 capsules (600 mg total) by mouth 3 (three) times a week. 30 capsule 3  . sitaGLIPtin  (JANUVIA) 100 MG tablet Take 1 tablet (100 mg total) by mouth daily. 30 tablet 3   No current facility-administered medications for this visit.    No Known Allergies  Review of Systems  No fever No productive cough She is taking her recommended antibiotics as scheduled for atypical Mycobacterium  BP 121/71 mmHg  Pulse 71  Resp 20  Ht 5\' 8"  (1.727 m)  Wt 337 lb (152.862 kg)  BMI 51.25 kg/m2  SpO2 91% Physical Exam Very alert and comfortable , looks much stronger Lungs are clear Chest incisions healing Heart rhythm regular Neuro intact  Diagnostic Tests: Chest x-ray images personally reviewed showing resolution of almost all postop changes  Impression: Patient may drive do normal activity levels, lift up to 25 pounds. Normal skin care for skin incisions I've given her a prescription for Vicodin as well as some Flexeril 10 mg by mouth each bedtime to help with her post VATS muscle spasm pain  Plan:return as needed Consider weaning prednisone from 20 down to10 mg a day   Len Childs, MD [facility] [phone]

## 2014-07-01 NOTE — Patient Outreach (Signed)
[place] [facility]) Care Management   07/01/2014  Stacey Page [birth year] [id number]  Stacey Page is an 64 y.o. female Initial home visit:  Arrived at 10 am.  Patient walked to the door and was winded.  Daughter present during home visit and is actively engaged. Subjective: Patient reports that she is doing better since hospital discharge. Patient reports that she is starting to be more active.  Patient reports that she weighs daily but does not record., States that she does not use salt. Patient reports that she always takes her meds as prescribed, Uses a pill box. Patient reports that she does not check CBG. Reports that she does not know if she is supposed to be checking CBG.  Reports that she tries to avoid sweets. Patient uses her CPAP at night without difficulty. Patient reports difficulty with claims being denied for inpatient admission.  Objective:  Awake and alert.  Ambulates with some difficulty.  Daughter assist patient to get up off sofa. After resting from walking to door patient is able to catch her breath without problems and talk in complete sentences.  Home is well kept and clean. Daughter is very helpful in caring for patients needs. Filed Vitals:   07/01/14 1031  BP: 108/60  Pulse: 66  Resp: 20  Height: 1.727 m (5\' 8" )  Weight: 337 lb (152.862 kg)  SpO2: 94%   Review of Systems  Constitutional: Negative for fever.  Respiratory: Positive for shortness of breath.        Shortness of breath with activity  Cardiovascular: Negative for chest pain and leg swelling.  Gastrointestinal: Negative for abdominal pain, diarrhea and constipation.  Genitourinary: Negative for dysuria.    Physical Exam  Constitutional: She is oriented to person, place, and time. She appears well-developed and well-nourished.  Cardiovascular: Normal rate and regular rhythm.   Respiratory: Effort normal and breath sounds normal. No respiratory distress.  GI: Soft. Bowel sounds  are normal.  Musculoskeletal: She exhibits no edema.  Neurological: She is alert and oriented to person, place, and time.  Skin: Skin is warm and dry.  Incision under the right breast healing. Scab noted.  No drainage.  Psychiatric: She has a normal mood and affect. Her behavior is normal. Judgment and thought content normal.    Current Medications:   Current Outpatient Prescriptions  Medication Sig Dispense Refill  . albuterol (PROAIR HFA) 108 (90 BASE) MCG/ACT inhaler Inhale 2 puffs into the lungs every 6 (six) hours as needed for wheezing or shortness of breath. 1 Inhaler 3  . azithromycin (ZITHROMAX) 500 MG tablet Take 1 tablet (500 mg total) by mouth 3 (three) times a week. 30 tablet 3  . budesonide-formoterol (SYMBICORT) 160-4.5 MCG/ACT inhaler Inhale 2 puffs into the lungs 2 (two) times daily.    . Clobetasol Prop Emollient Base (CLOBETASOL PROPIONATE E) 0.05 % emollient cream Apply 1 application topically daily as needed (skin irritations).     Marland Kitchen ethambutol (MYAMBUTOL) 400 MG tablet Take 6 tablets (2,400 mg total) by mouth 3 (three) times a week. On Monday-Wednesday-Friday 30 tablet 3  . fluticasone (FLONASE) 50 MCG/ACT nasal spray Place 1 spray into both nostrils daily. 16 g 3  . furosemide (LASIX) 40 MG tablet Take 1 tablet (40 mg total) by mouth daily. 90 tablet 1  . HYDROcodone-acetaminophen (NORCO/VICODIN) 5-325 MG per tablet Take 1-2 tablets by mouth every 4 (four) hours as needed for moderate pain. 30 tablet 0  . labetalol (NORMODYNE) 200 MG tablet Take 1 tablet (  200 mg total) by mouth 2 (two) times daily. 180 tablet 3  . lisinopril (PRINIVIL,ZESTRIL) 20 MG tablet Take 1 tablet (20 mg total) by mouth daily. 30 tablet 1  . montelukast (SINGULAIR) 10 MG tablet Take 10 mg by mouth at bedtime.    . potassium citrate (UROCIT-K) 10 MEQ (1080 MG) SR tablet Take 1 tablet (10 mEq total) by mouth daily. 90 tablet 3  . predniSONE (DELTASONE) 20 MG tablet Take 1 tablet (20 mg total) by  mouth daily. Taper down to 20mg  and hold at this dose until next office visit 30 tablet 1  . rifampin (RIFADIN) 300 MG capsule Take 2 capsules (600 mg total) by mouth 3 (three) times a week. 30 capsule 3  . sitaGLIPtin (JANUVIA) 100 MG tablet Take 1 tablet (100 mg total) by mouth daily. 30 tablet 3   No current facility-administered medications for this visit.    Functional Status:   In your present state of health, do you have any difficulty performing the following activities: 07/01/2014 06/16/2014  Hearing? N N  Vision? Y N  Difficulty concentrating or making decisions? Y N  Walking or climbing stairs? Y Y  Dressing or bathing? Y Y  Doing errands, shopping? Tempie Donning  Preparing Food and eating ? Y -  Using the Toilet? Y -  In the past six months, have you accidently leaked urine? N -  Do you have problems with loss of bowel control? N -  Managing your Medications? N -  Managing your Finances? N -  Housekeeping or managing your Housekeeping? Y -    Fall/Depression Screening:    PHQ 2/9 Scores 07/01/2014  PHQ - 2 Score 5  PHQ- 9 Score 11    Assessment:   (1) In transition of care program. Explained [facility] program. (2) not checking CBG. Todays reading 150 (3) no advanced directives (4) not recording daily weights (5) high depression screening (6) Problems with denial of claim (7) wants to lose weight  Equipment in the home:  Cane.  Plan:  (1) consent obtained. Provided contact information and magnet for 24 hour nurse line. (2) Placed call to MD office and confirm if patient needs to monitor. (3) Provided Advanced directives packet. Patient will review and notify me if she needs assistance with completion.  (4) reviewed importance of daily weights. Encouraged daily weights at the same time every day. Discussed need for patient to call MD for weight gain. Reviewed heart failure zones. Provided [facility] calendar for recording daily weights. (5) Will send depression screening to MD.  Patient  denies wanting treatment at this time. Feels a lot of her depression is related to recent hospitalization. (6) 3 phone calls to [employer] [phone]. Spoke with Lelan Pons who will have claim reprocessed.  Patient informed. (7) will encouraged patient and provide support for increased exercise and diet.  Collaboration and goal planning with patient and daughter. Priority goal is to avoid readmission. 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 Patient to discuss all concerns with MD.  Will provide telephone call to patient in 7 days for follow up on progress.  Telephone calls during home visit:  Call to [place] about letter patient received about transition of care program.  [phone] Telephone call  X 3 to [employer] regarding claim. Spoke with Lelan Pons and I will await a call back about claim has been reprocessed.   Education and supplies provided:  [facility] calendar provided, [place] Heart Failure booklet, [facility] DM  packet provided, [facility] new patient packet reviewed with magnet. EMMI provided:  Heart failure  Keeping track of your weight each day, Heart failure   How to be heart smart,  Low salt diet,  DM controlling blood sugar, Type 2 DM food and exercise.   Tomasa Rand, RN, BSN, CEN [facility] [employer] [phone]

## 2014-07-01 NOTE — Telephone Encounter (Signed)
Stacey Page cook with [employer]  Need to know how often pt is suppose to checking her sugar

## 2014-07-02 ENCOUNTER — Other Ambulatory Visit: Payer: Self-pay

## 2014-07-02 ENCOUNTER — Ambulatory Visit: Payer: PPO | Admitting: Cardiothoracic Surgery

## 2014-07-02 NOTE — Patient Outreach (Signed)
Voice mail received from primary MD office to state that patient is not required to monitor CBG.    I placed call to patient to inform of above. Also informed patient that claim is being reprocessed by [employer].  Patient was happy to hear the good news.  Plan:  Will call patient in 1 week for follow up.  Tomasa Rand, RN, BSN, CEN [facility] [employer] [phone]

## 2014-07-02 NOTE — Telephone Encounter (Signed)
She does not need to be checking. If she really wants to check I would recommend once a day first thing in the morning.

## 2014-07-02 NOTE — Telephone Encounter (Signed)
Called and left a message for Estill Bamberg informing her of how many times the patient should be checking her blood sugar.

## 2014-07-02 NOTE — Telephone Encounter (Signed)
Dr. Doug Sou how many times a day would you like this patient to check her blood sugar?

## 2014-07-04 NOTE — Discharge Summary (Signed)
Physician Discharge Summary       [address]       [place] [address]             [phone]    Patient ID: Stacey Page MRN: [id number] DOB/AGE: 63-Aug-1952 63 y.o.  Admit date: 06/10/2014 Discharge date: 06/14/2014  Admission Diagnoses: 1. Pulmonary fibrosis 2. History of hypertension 3. History of diabetes mellitus 4. History of OSA 5. History of interstitial lung disease 6. History of OA 7. History of asthma  Discharge Diagnoses:  1. Pulmonary fibrosis 2. History of hypertension 3. History of diabetes mellitus 4. History of OSA 5. History of interstitial lung disease 6. History of OA 7. History of asthma    Procedure (s):  Right VATS (video-assisted thoracoscopic surgery) with right lung biopsies by Dr. Prescott Gum on 06/10/2014.  Pathology: 1. Lung, wedge biopsy/resection, Right middle lobe - BRONCHIOLITIS WITH SCATTERED NON NECROTIZING GRANULOMATOUS INFLAMMATION AND RARE ACID FAST BACILLI ON SPECIAL STAINS, MOST CONSISTENT WITH ATYPICAL MYCOBACTERIA. - SEE COMMENT 2. Lung, wedge biopsy/resection, Right upper lobe - BRONCHIOLITIS WITH SCATTERED NON NECROTIZING GRANULOMATOUS INFLAMMATION AND RARE ACID FAST BACILLI ON SPECIAL STAINS, MOST CONSISTENT WITH ATYPICAL MYCOBACTERIA. - SEE COMMENT  History of Presenting Illness: Patient presents with recent diagnosis of interstitial lung disease by high definition CT scan associated with clinical symptoms of progressive shortness of breath and decreasing exercise tolerance. The patient was placed on prednisone empirically by her pulmonologist with some improvement in her symptoms. Before long-term prednisone is recommended, the patient's pulmonologist wishes a lung biopsy to document the type of interstitial lung disease. The patient is a nonsmoker and is had no previous chest surgery. She is morbidly obese with diabetes and difficulty performing daily activities. The patient has had echocardiogram  which was normal, PFTs which are adequate for VATS lung biopsy, with DLCO reduced at 52%. Dr. Prescott Gum discussed potential risks, benefits, and complications of the surgery and the patient wished to proceed.  Brief Hospital Course:  She remained afebrile and hemodynamically stable. A line and foley were removed early in her post operative course. Chest tube output gradually decreased and there was no air leak. Daily chest x rays were obtained and remained stable. She was hypertensive and so she was restarted on her home medications. She is a diabetic and once she was tolerating a diet, home diabetic medications were resumed. Chest tube was removed on 06/12/2014. She was felt surgically stable for transfer from the ICU to 2000 for further convalescence. She was tolerating a diet and had a bowel movement. She was ambulating on room air. Her right chest wound was clean and dry and there were no signs of infection. She was felt surgically stable for discharge on 06/14/2014.   Latest Vital Signs: Blood pressure 125/46, pulse 62, temperature 98.2 F (36.8 C), temperature source Oral, resp. rate 20, height 5\' 8"  (1.727 m), weight 359 lb 12.7 oz (163.2 kg), SpO2 100 %.  Physical Exam: Cardiovascular: RRR Pulmonary: Clear to auscultation bilaterally; no rales, wheezes, or rhonchi. Abdomen: Soft, non tender, bowel sounds present. Wounds: Clean and dry. No erythema or signs of infection.  Discharge Condition:Stable and discharged to home.  Recent laboratory studies:  Lab Results  Component Value Date   WBC 9.8 06/16/2014   HGB 11.3* 06/16/2014   HCT 35.4* 06/16/2014   MCV 91.9 06/16/2014   PLT 273 06/16/2014   Lab Results  Component Value Date   NA 135 06/16/2014   K 4.7 06/16/2014  CL 98* 06/16/2014   CO2 23 06/16/2014   CREATININE 1.52* 06/16/2014   GLUCOSE 148* 06/16/2014      Diagnostic Studies:   Dg Chest 2 View  06/13/2014   CLINICAL DATA:  Pulmonary fibrosis  EXAM: CHEST  2  VIEW  COMPARISON:  06/12/2014  FINDINGS: The right chest tube has been removed. There is no pneumothorax. Mild linear opacities are present bilaterally, improved with partial clearance of the basilar airspace opacities compare to the prior study.  IMPRESSION: Right chest tube removal.  No pneumothorax.  Improved.   Electronically Signed   By: Andreas Newport M.D.   On: 06/13/2014 06:50    Discharge Medications:   Medication List    STOP taking these medications        albuterol 108 (90 BASE) MCG/ACT inhaler  Commonly known as:  PROAIR HFA     fluticasone 50 MCG/ACT nasal spray  Commonly known as:  FLONASE     predniSONE 20 MG tablet  Commonly known as:  DELTASONE      TAKE these medications        budesonide-formoterol 160-4.5 MCG/ACT inhaler  Commonly known as:  SYMBICORT  Inhale 2 puffs into the lungs 2 (two) times daily.     CLOBETASOL PROPIONATE E 0.05 % emollient cream  Generic drug:  Clobetasol Prop Emollient Base  Apply 1 application topically daily as needed (skin irritations).     furosemide 40 MG tablet  Commonly known as:  LASIX  Take 1 tablet (40 mg total) by mouth daily.     labetalol 200 MG tablet  Commonly known as:  NORMODYNE  Take 1 tablet (200 mg total) by mouth 2 (two) times daily.     lisinopril 20 MG tablet  Commonly known as:  PRINIVIL,ZESTRIL  Take 1 tablet (20 mg total) by mouth daily.     montelukast 10 MG tablet  Commonly known as:  SINGULAIR  Take 10 mg by mouth at bedtime.     potassium citrate 10 MEQ (1080 MG) SR tablet  Commonly known as:  UROCIT-K  Take 1 tablet (10 mEq total) by mouth daily.     sitaGLIPtin 100 MG tablet  Commonly known as:  JANUVIA  Take 1 tablet (100 mg total) by mouth daily.        Follow Up Appointments:     Follow-up Information    Follow up with Ivin Poot III, MD On 07/02/2014.   Specialty:  Cardiothoracic Surgery   Why:  Have a chest x-ray at [place] at 1:00, then see MD at 2:00    Contact information:   [address] [place] [address] [phone]       Follow up with [facility] RN On 06/19/2014.   Why:  For suture removal wtih Dr. Lucianne Lei Trigt's nurse at 10:00      Signed: Ryanna Teschner MPA-C 07/04/2014, 9:24 AM

## 2014-07-08 ENCOUNTER — Other Ambulatory Visit: Payer: Self-pay

## 2014-07-08 NOTE — Patient Outreach (Signed)
Transition of care call:  10:10 am Placed call to patient.  Subjective:Patient reports that she is doing well. Denies shortness of breath. Reports that she has been weighing daily.  Range 333-336 pounds. Reports that she has been able to walk daily to the mailbox and now is planning to start working out.  I expressed to patient how proud of her that I was. Patient reports that she is proud of herself.Marland KitchenMarland KitchenMarland KitchenPatient reports that she has been discharged from the surgeon.  Objective:  Appears happy on the phone.  Able to talk in complete sentences. Weight of 336 pounds today. ( loss of 1 pound) Assessment: Patient continue to be able to self manage at home. No new problems identified. Plan: Patient is going to increase her exercise this week.  Will continue transition of care calls weekly. Discussed plan with patient for a return call in 7 days.  [facility] CM Care Plan Problem One        Patient Outreach Telephone from 07/08/2014 in [place] Problem One  Recent admission to the hospital related to lung disease   Care Plan for Problem One  Active   [facility] Long Term Goal (31-90 days)  Patient will be able to report no readmission since hospital discharge for 31 days. Discharge date of 06/22/2014   [facility] Long Term Goal Start Date  07/01/14   Interventions for Problem One Long Term Goal  Discussed importance of timely follow up with MD. Reviewed importance of daily weights, following diet and taking meds as prescribed.   [facility] CM Short Term Goal #1 (0-30 days)  Patient will be able to report recording daily weights for the next 7 days.   [facility] CM Short Term Goal #1 Start Date  07/01/14   [facility] CM Short Term Goal #1 Met Date  07/08/14 [Patient verbalizes that she has weighed daily range 334-336 ]   Interventions for Short Term Goal #1  Discussed with patient the importance of weighing daily at the same time every day,  reviewed foods high in salt, discussed Heart Failure zones, Provided [facility] calendar,  Provided Heart Failure packet from [facility], Discussed importance of early intervention when symptoms occur.   [facility] CM Short Term Goal #2 (0-30 days)  Patient will be able to verbalize walking 3 times a day to the mailbox daily for the next 7 days.   [facility] CM Short Term Goal #2 Start Date  07/01/14   [facility] CM Short Term Goal #2 Met Date  07/08/14 [Patient reports that she has accomplished this goal.]   Interventions for Short Term Goal #2  discussed importance of exerise. Encouraged patient to have someone with her when she walks.  Encouarged rest breaks as needed. Encouarged walking in the early morning to avoid the heat.    [place] Plan Problem Two        Patient Outreach Telephone from 07/08/2014 in [place] Problem Two  Increased BMI and  patient wanting to lose weight.   Care Plan for Problem Two  Active   [facility] CM Short Term Goal #1 (0-30 days)  Patient will be able to verbalize weight loss of 5 pounds in 30 days.   [facility]N CM Short Term Goal #1 Start Date  07/01/14   [facility] CM Short Term Goal #1 Met Date   -- [update: Patient reports that she is eating smaller portions.]   Interventions for Short Term Goal #2   Discussed  importance of starting slow to lose weight. Provided motivation, Encouraged family  support.      [place] Problem Three        Patient Outreach Telephone from 07/08/2014 in [place] Problem Three  Difficulty navigating health care system   Care Plan for Problem Three  Active   [facility] CM Short Term Goal #1 (0-30 days)  Patient will be able to verbalize understanding of how to call insurance agency regarding problems with claims in the next 30 days.   [facility] CM Short Term Goal #1 Start Date  07/01/14   Interventions for Short Term Goal #1  Placed 3 calls to [employer] regarding claim denial related to hospital admission. eencouraged patient and daughter to call regarding denial claims and questions to [employer].    Tomasa Rand, RN, BSN, CEN [facility] [employer] [phone]

## 2014-07-11 ENCOUNTER — Encounter (HOSPITAL_COMMUNITY): Payer: Self-pay

## 2014-07-12 ENCOUNTER — Other Ambulatory Visit: Payer: Self-pay | Admitting: Adult Health

## 2014-07-14 ENCOUNTER — Telehealth: Payer: Self-pay | Admitting: Internal Medicine

## 2014-07-14 ENCOUNTER — Other Ambulatory Visit: Payer: Self-pay | Admitting: Geriatric Medicine

## 2014-07-14 ENCOUNTER — Encounter (HOSPITAL_COMMUNITY): Payer: Self-pay

## 2014-07-14 MED ORDER — FUROSEMIDE 40 MG PO TABS: 40.0000 mg | ORAL_TABLET | Freq: Every day | ORAL | Status: DC

## 2014-07-14 NOTE — Telephone Encounter (Signed)
Sent to pharmacy 

## 2014-07-14 NOTE — Telephone Encounter (Signed)
Pt requesting refill on Prednisone 20 mg. It was last filled elsewhere  Pt saw TP 06/25/14. Pt has f/u 08/13/14 with [facility]. Can we refill Prednisone 20mg  until seen.

## 2014-07-14 NOTE — Telephone Encounter (Signed)
Patient is requesting refill on lasix to be sent to [facility] [address].

## 2014-07-15 ENCOUNTER — Other Ambulatory Visit: Payer: Self-pay

## 2014-07-15 NOTE — Patient Outreach (Signed)
Transition of care call: Patient reports that she is doing well. Reports weight today of 332 pounds.  Has been exercising about 5 minutes per day. Denies swelling.  Reports that she is going to start exercising early mornings to avoid the heat.  Denies any new problems or concerns.  Reports that she is leaving to go to [place] for 2 weeks and will return on 6/29.  Discussed with patient that I would call her on 6/30 for follow up.  Advised patient to call MD for problems or concerns.  Tomasa Rand, RN, BSN, CEN [facility] [employer] [phone]

## 2014-07-17 ENCOUNTER — Other Ambulatory Visit: Payer: Self-pay | Admitting: Physician Assistant

## 2014-07-19 ENCOUNTER — Other Ambulatory Visit: Payer: Self-pay | Admitting: Cardiothoracic Surgery

## 2014-07-29 ENCOUNTER — Other Ambulatory Visit: Payer: Self-pay | Admitting: Physician Assistant

## 2014-08-07 ENCOUNTER — Other Ambulatory Visit: Payer: Self-pay

## 2014-08-07 ENCOUNTER — Other Ambulatory Visit: Payer: Self-pay | Admitting: *Deleted

## 2014-08-07 DIAGNOSIS — G8918 Other acute postprocedural pain: Secondary | ICD-10-CM

## 2014-08-07 MED ORDER — HYDROCODONE-ACETAMINOPHEN 5-325 MG PO TABS: 1.0000 | ORAL_TABLET | ORAL | Status: DC | PRN

## 2014-08-07 NOTE — Telephone Encounter (Signed)
Ms. Damo has called requesting a refill for her Norco s/p thoracic surgery in May per Dr Prescott Gum.   I informed her that a new signed script would be available today at our office and she agreed.

## 2014-08-07 NOTE — Patient Outreach (Signed)
Transition of care call: Spoke with patient who reports that she is doing well. Reports that she got back from [place] yesterday. Reports that her breathing is doing well. States that she has not gained any weight.  Todays weight of 334 pounds. Reports that she is working on increasing her strength and walking.    Overall states that she is improving. Plan to return to [place] in the next upcoming week. Offered home visit for July 5th and patient has accepted.    [facility] CM Care Plan Problem One        Patient Outreach Telephone from 08/07/2014 in [place] Problem One  Recent admission to the hospital related to lung disease   Care Plan for Problem One  Active   [facility]N Long Term Goal (31-90 days)  Patient will be able to report no readmission since hospital discharge for 31 days. Discharge date of 06/22/2014   [facility] Long Term Goal Start Date  07/01/14   [facility] Long Term Goal Met Date  08/07/14 Barrie Folk met. no readmissions.  Has been out of town for 10 days]   Interventions for Problem One Long Term Goal  Discussed importance of timely follow up with MD. Reviewed importance of daily weights, following diet and taking meds as prescribed.   [facility] CM Short Term Goal #1 (0-30 days)  Patient will be able to report recording daily weights for the next 7 days.   [facility] CM Short Term Goal #1 Start Date  07/01/14   [facility] CM Short Term Goal #1 Met Date  07/08/14 [Patient verbalizes that she has weighed daily range 334-336 ]   Interventions for Short Term Goal #1  Discussed with patient the importance of weighing daily at the same time every day,  reviewed foods high in salt, discussed Heart Failure zones, Provided [facility] calendar, Provided Heart Failure packet from [facility], Discussed importance of early intervention when symptoms occur.   [facility] CM Short Term Goal #2 (0-30 days)  Patient will be able to verbalize walking 3 times a day to the mailbox daily for the next 7 days.   [facility] CM Short Term Goal  #2 Start Date  07/01/14   [facility] CM Short Term Goal #2 Met Date  07/08/14 [Patient reports that she has accomplished this goal.]   Interventions for Short Term Goal #2  discussed importance of exerise. Encouraged patient to have someone with her when she walks.  Encouarged rest breaks as needed. Encouarged walking in the early morning to avoid the heat.    [place] Plan Problem Two        Patient Outreach Telephone from 08/07/2014 in [place] Problem Two  Increased BMI and  patient wanting to lose weight.   Care Plan for Problem Two  Active   [facility] CM Short Term Goal #1 (0-30 days)  Patient will be able to verbalize weight loss of 5 pounds in 30 days.   [facility] CM Short Term Goal #1 Start Date  07/01/14 [date restarted]   [facility] CM Short Term Goal #1 Met Date   -- [Goal not met. date restarted.]   Interventions for Short Term Goal #2   Discussed importance of starting slow to lose weight. Provided motivation, Encouraged family  support.      [place] Problem Three        Patient Outreach Telephone from 08/07/2014 in [place]  Problem Three  Difficulty navigating health care system   Care Plan for Problem Three  Active   [facility] CM Short Term Goal #1 (0-30 days)  Patient will be able to verbalize understanding of how to call insurance agency regarding problems with claims in the next 30 days.   [facility]N CM Short Term Goal #1 Start Date  07/01/14 [will review progress toward goal at home visit]   Interventions for Short Term Goal #1  Will reasses progress at home visit in 5 days.      Plan : Home visit on 08/12/2014  Tomasa Rand, RN, BSN, CEN [place] [facility] [phone]

## 2014-08-12 ENCOUNTER — Other Ambulatory Visit: Payer: Self-pay

## 2014-08-12 VITALS — BP 122/80 | HR 62 | Resp 20 | Ht 68.0 in | Wt 334.0 lb

## 2014-08-12 DIAGNOSIS — J449 Chronic obstructive pulmonary disease, unspecified: Secondary | ICD-10-CM

## 2014-08-12 NOTE — Patient Outreach (Signed)
[name]) Care Management   08/12/2014  Stacey Page [birth year] [id number]  Stacey Page is an 64 y.o. female Routine home visit at 10 am. Patient answered the door without problems. Subjective:  Patient reports today that she is doing very well. Reports that she continues to work on her weight loss.  Reports that she has not able to achieve her goal of 5 pounds in the last 30 days. But patient verbalizes that she wants to continue to try to lose weight and increase her exercise program to help with her breathing. Patient reports to me that she has gone to social services to get needed help and has completed applications for medical bill assistance.  At this time patient reports that she has put everything  "bills" on hold until she hears back from social services. Patient reports that she just returned from [place] taking care of some business.  Patient verbalizes follow up appointment with pulmonary tomorrow. Patient denies any new problems or concerns.   Objective: Ambulating in the home without problems. Awake and alert. Talking in complete sentences.  Filed Vitals:   08/12/14 1023  BP: 122/80  Pulse: 62  Resp: 20  Height: 1.727 m ('5\' 8"' )  Weight: 334 lb (151.501 kg)  SpO2: 98%    Review of Systems  Respiratory: Positive for cough and wheezing. Negative for sputum production and shortness of breath.   Cardiovascular: Negative for chest pain.    Physical Exam  Constitutional: She is oriented to person, place, and time. She appears well-developed and well-nourished.  Cardiovascular: Normal rate.   Respiratory: Effort normal and breath sounds normal.  Lungs clear today. No shortness of breath. Respirations even and unlabored.  GI: Soft. Bowel sounds are normal.  Musculoskeletal: She exhibits no edema.  Neurological: She is alert and oriented to person, place, and time.  Skin: Skin is warm and dry.  Psychiatric: She has a normal mood and affect. Her  behavior is normal. Judgment and thought content normal.  Feet are free of wounds.   Current Medications:   Current Outpatient Prescriptions  Medication Sig Dispense Refill  . albuterol (PROAIR HFA) 108 (90 BASE) MCG/ACT inhaler Inhale 2 puffs into the lungs every 6 (six) hours as needed for wheezing or shortness of breath. 1 Inhaler 3  . azithromycin (ZITHROMAX) 500 MG tablet Take 1 tablet (500 mg total) by mouth 3 (three) times a week. 30 tablet 3  . budesonide-formoterol (SYMBICORT) 160-4.5 MCG/ACT inhaler Inhale 2 puffs into the lungs 2 (two) times daily.    . Clobetasol Prop Emollient Base (CLOBETASOL PROPIONATE E) 0.05 % emollient cream Apply 1 application topically daily as needed (skin irritations).     Marland Kitchen ethambutol (MYAMBUTOL) 400 MG tablet Take 6 tablets (2,400 mg total) by mouth 3 (three) times a week. On Monday-Wednesday-Friday 30 tablet 3  . fluticasone (FLONASE) 50 MCG/ACT nasal spray Place 1 spray into both nostrils daily. 16 g 3  . furosemide (LASIX) 40 MG tablet Take 1 tablet (40 mg total) by mouth daily. 90 tablet 1  . HYDROcodone-acetaminophen (NORCO/VICODIN) 5-325 MG per tablet Take 1-2 tablets by mouth every 4 (four) hours as needed for moderate pain. 30 tablet 0  . labetalol (NORMODYNE) 200 MG tablet Take 1 tablet (200 mg total) by mouth 2 (two) times daily. 180 tablet 3  . lisinopril (PRINIVIL,ZESTRIL) 20 MG tablet Take 1 tablet (20 mg total) by mouth daily. 30 tablet 1  . montelukast (SINGULAIR) 10 MG tablet Take 10 mg by  mouth at bedtime.    . potassium citrate (UROCIT-K) 10 MEQ (1080 MG) SR tablet Take 1 tablet (10 mEq total) by mouth daily. 90 tablet 3  . rifampin (RIFADIN) 300 MG capsule Take 2 capsules (600 mg total) by mouth 3 (three) times a week. 30 capsule 3  . sitaGLIPtin (JANUVIA) 100 MG tablet Take 1 tablet (100 mg total) by mouth daily. 30 tablet 3  . predniSONE (DELTASONE) 20 MG tablet TAPER TO 20 MG AND HOLD AT DOSE UNTIL NEXT OFFICE VISIT (Patient not  taking: Reported on 08/12/2014) 30 tablet 0   No current facility-administered medications for this visit.    Functional Status:   In your present state of health, do you have any difficulty performing the following activities: 07/01/2014 06/16/2014  Hearing? N N  Vision? Y N  Difficulty concentrating or making decisions? Y N  Walking or climbing stairs? Y Y  Dressing or bathing? Y Y  Doing errands, shopping? Tempie Donning  Preparing Food and eating ? Y -  Using the Toilet? Y -  In the past six months, have you accidently leaked urine? N -  Do you have problems with loss of bowel control? N -  Managing your Medications? N -  Managing your Finances? N -  Housekeeping or managing your Housekeeping? Y -    Fall/Depression Screening:    PHQ 2/9 Scores 07/01/2014  PHQ - 2 Score 5  PHQ- 9 Score 11    Assessment:   (1) Doing well. Reports breathing is much better. No sputum or cough. Reports taking medications as prescribed. Denies any new problems or concerns. (2) medical bill concerns on hold. Pending approval of applications with department of social services. (3) Taking medications as prescribed.  Plan:  (1) discussed with patient about goals for her health. At this point patient denies need for home visit but is interested in continuing to work with health coach for COPD management/ weight loss to improve breathing. (2) Patient will follow up with social services as planned. (3) Patient will continue to take medications as prescribed.  Collaborative goal setting and care planning with patient during home visit. Patients priority goal is weight loss and COPD management.  Discussed with patient next steps and patient is interested in a health coach. Referral made to transfer care to health coach.   Reminded patient of the 24 nurse line if needed in the future. [facility] CM Care Plan Problem One        Patient Outreach from 08/12/2014 in [place] Problem One  Recent  admission to the hospital related to lung disease   Care Plan for Problem One  Not Active   [facility] Long Term Goal (31-90 days)  Patient will be able to report no readmission since hospital discharge for 31 days. Discharge date of 06/22/2014   [facility] Long Term Goal Start Date  07/01/14   [facility] Long Term Goal Met Date  08/07/14 Barrie Folk met. no readmissions.  Has been out of town for 10 days]   Interventions for Problem One Long Term Goal  Discussed importance of timely follow up with MD. Reviewed importance of daily weights, following diet and taking meds as prescribed.   [facility] CM Short Term Goal #1 (0-30 days)  Patient will be able to report recording daily weights for the next 7 days.   [facility] CM Short Term Goal #1 Start Date  07/01/14   [facility] CM Short Term Goal #1 Met Date  07/08/14 [  Patient verbalizes that she has weighed daily range 334-336 ]   Interventions for Short Term Goal #1  Discussed with patient the importance of weighing daily at the same time every day,  reviewed foods high in salt, discussed Heart Failure zones, Provided [facility] calendar, Provided Heart Failure packet from [facility], Discussed importance of early intervention when symptoms occur.   [facility] CM Short Term Goal #2 (0-30 days)  Patient will be able to verbalize walking 3 times a day to the mailbox daily for the next 7 days.   [facility] CM Short Term Goal #2 Start Date  07/01/14   [facility] CM Short Term Goal #2 Met Date  07/08/14 [Patient reports that she has accomplished this goal.]   Interventions for Short Term Goal #2  discussed importance of exerise. Encouraged patient to have someone with her when she walks.  Encouarged rest breaks as needed. Encouarged walking in the early morning to avoid the heat.    [place] Plan Problem Two        Patient Outreach from 08/12/2014 in [place] Problem Two  Increased BMI and  patient wanting to lose weight.   Care Plan for Problem Two  Active   Interventions for Problem Two Long Term  Goal   Discussed importance of healthy diet  and exercise program.   [facility] Long Term Goal (31-90) days  Patient would report weight loss of 12 pounds of weight loss in the next 90 days.   [facility] Long Term Goal Start Date  08/12/14   [facility] CM Short Term Goal #1 (0-30 days)  Patient will be able to verbalize weight loss of 5 pounds in 30 days.   [facility] CM Short Term Goal #1 Start Date  08/12/14 [date restarted]   [facility] CM Short Term Goal #1 Met Date   -- [Goal not met. date restarted.]   Interventions for Short Term Goal #2   Reinforce  importance of starting slow to lose weight. Provided motivation, Encouraged family  support. Patient wants to exercise 3 times per week early in the mornings or late in the evenings.  Discussed importance of rest periods if needed.      [place] Problem Three        Patient Outreach from 08/12/2014 in [place] Problem Three  Difficulty navigating health care system   Care Plan for Problem Three  Active   [facility] CM Short Term Goal #1 (0-30 days)  Patient will be able to verbalize understanding of how to call insurance agency regarding problems with claims in the next 30 days.   [facility] CM Short Term Goal #1 Start Date  07/01/14 [will review progress toward goal at home visit]   [facility] CM Short Term Goal #1 Met Date  08/12/14 [Patient went to social services and completed applications.]   Interventions for Short Term Goal #1  Update:  patient went to social services to complete applications for assistance with medical bills. At this time patient has put everything on hold until she hears back about approval.     Will close Case Management program and send update to MD to inform of care transferred to health coach.  Tomasa Rand, RN, BSN, CEN [facility] [employer] [phone]

## 2014-08-13 ENCOUNTER — Telehealth: Payer: Self-pay | Admitting: Internal Medicine

## 2014-08-13 ENCOUNTER — Encounter: Payer: Self-pay | Admitting: Infectious Diseases

## 2014-08-13 ENCOUNTER — Other Ambulatory Visit (INDEPENDENT_AMBULATORY_CARE_PROVIDER_SITE_OTHER): Payer: PPO

## 2014-08-13 ENCOUNTER — Encounter: Payer: Self-pay | Admitting: Pulmonary Disease

## 2014-08-13 ENCOUNTER — Other Ambulatory Visit: Payer: Self-pay | Admitting: Geriatric Medicine

## 2014-08-13 ENCOUNTER — Ambulatory Visit (INDEPENDENT_AMBULATORY_CARE_PROVIDER_SITE_OTHER): Payer: PPO | Admitting: Pulmonary Disease

## 2014-08-13 VITALS — BP 138/88 | HR 70 | Ht 67.0 in | Wt 337.0 lb

## 2014-08-13 DIAGNOSIS — A310 Pulmonary mycobacterial infection: Secondary | ICD-10-CM

## 2014-08-13 DIAGNOSIS — G4733 Obstructive sleep apnea (adult) (pediatric): Secondary | ICD-10-CM | POA: Diagnosis not present

## 2014-08-13 DIAGNOSIS — J4530 Mild persistent asthma, uncomplicated: Secondary | ICD-10-CM

## 2014-08-13 DIAGNOSIS — Z9989 Dependence on other enabling machines and devices: Secondary | ICD-10-CM

## 2014-08-13 LAB — COMPREHENSIVE METABOLIC PANEL
ALBUMIN: 4 g/dL (ref 3.5–5.2)
ALT: 14 U/L (ref 0–35)
AST: 13 U/L (ref 0–37)
Alkaline Phosphatase: 59 U/L (ref 39–117)
BUN: 19 mg/dL (ref 6–23)
CALCIUM: 9.8 mg/dL (ref 8.4–10.5)
CO2: 30 mEq/L (ref 19–32)
Chloride: 103 mEq/L (ref 96–112)
Creatinine, Ser: 1.4 mg/dL — ABNORMAL HIGH (ref 0.40–1.20)
GFR: 48.89 mL/min — AB (ref >60.00)
Glucose, Bld: 74 mg/dL (ref 70–99)
Potassium: 4.3 mEq/L (ref 3.5–5.1)
SODIUM: 140 meq/L (ref 135–145)
TOTAL PROTEIN: 7.6 g/dL (ref 6.0–8.3)
Total Bilirubin: 0.5 mg/dL (ref 0.2–1.2)

## 2014-08-13 MED ORDER — MONTELUKAST SODIUM 10 MG PO TABS: 10.0000 mg | ORAL_TABLET | Freq: Every day | ORAL | Status: DC

## 2014-08-13 NOTE — Telephone Encounter (Signed)
Sent to pharmacy 

## 2014-08-13 NOTE — Patient Outreach (Signed)
[facility]) Care Management  08/13/2014  Stacey Page November 15, 1950 [id number]   Notification from Tomasa Rand, RN to assign [place], Candie Mile, RN assigned.  Ronnell Freshwater. [place], [place] Management [place] Assistant Phone: [phone] Fax: [phone]

## 2014-08-13 NOTE — Telephone Encounter (Signed)
Patient has been requesting a refill of montelukast sod 10 Mg tab from [facility] on [address], but we have not received any of the request. Patient is in need of this script. Please call this in. Thank you.

## 2014-08-13 NOTE — Progress Notes (Signed)
Chief Complaint  Patient presents with  . Follow-up    Pt states that breathing has been doing okay since stopping Prednisone (completed course 08/12/14). Pt states that she started gaining weight and having anxiety.     History of Present Illness: Stacey Page is a 64 y.o. female former smoker with dyspnea from ILD from MAI, asthma, diastolic heart failure, obesity with deconditioning.  She also has sleep apnea.  She has been doing well since she got out of hospital.  She finished prednisone >> this was making her anxious and swollen.  She had f/u with Dr. Prescott Gum, and was told she was healing well after surgery.  She denies cough, wheeze, sputum, hemoptysis, or fever.  Her activity level is improving.  She is on 3 drug tx for MAI.  She was to have f/u with Dr. Johnnye Sima with ID, but this was not scheduled.  She uses symbicort and singulair.  She feels these help.  She is using CPAP at night.  TESTS: Echo 01/28/14 >> severe LVH, EF 65 to [id number], grade 2 diastolic dysfx, mild AS, mild/mod LA. HST 02/12/14 >> AHI 59.3, SaO2 low 67% Auto CPAP 03/07/14 to 03/09/14 >> used on 3 of 3 nights with average 8 hrs and 8 min.  Average AHI is 3.3 with median CPAP 12 cm H2O and 95 th percentile CPAP 14 cm H20. PFT 03/12/14 >> FEV1 2.05 (88%), FEV1% 87, TLC 4.14 (75%), DLCO 52%, no BD HRCT chest 03/20/14 >> multiple small nodules, patchy GGO, mild BTX, air trapping   Past medical hx >> Diastolic CHF, HTN, DM type II  Past surgical hx, Medications, Allergies, Family hx, Social hx all reviewed.   Physical Exam: BP 138/88 mmHg  Pulse 70  Ht 5\' 7"  (1.702 m)  Wt 337 lb (152.862 kg)  BMI 52.77 kg/m2  SpO2 96%  General - No distress ENT - No sinus tenderness, no oral exudate, no LAN Cardiac - s1s2 regular, no murmur Chest - No wheeze/rales/dullness Back - No focal tenderness Abd - Soft, non-tender Ext - No edema Neuro - Normal strength Skin - No rashes Psych - normal mood, and  behavior   Assessment/Plan:  Interstitial lung disease s/p Rt VATS bx 06/10/14 showing MAI. Plan: - continue ethambutol, azithromycin, and rifampin - check CMET, CBC today - need to ensure f/u with Dr. Johnnye Sima in [place] clinic - defer follow up imaging, PFT for now   Mild, persistent asthma. Plan: - continue symbicort, singulair, and prn proair  Obstructive sleep apnea. She is compliant with therapy and reports benefit. Plan: - continue auto CPAP  Diastolic heart failure. Plan: - continue tx for HTN and OSA  Obesity with deconditioning. Plan: - continue with diet, exercise, and weight reduction   Chesley Mires, MD [place] Pulmonary/Critical Care/Sleep Pager:  [phone]

## 2014-08-13 NOTE — Patient Instructions (Signed)
Will arrange for follow up with Dr. Johnnye Sima with infectious disease Lab tests today Follow up in 2 months

## 2014-08-14 ENCOUNTER — Telehealth: Payer: Self-pay | Admitting: Pulmonary Disease

## 2014-08-14 LAB — CBC
HCT: 35.8 % — ABNORMAL LOW (ref 36.0–46.0)
HEMOGLOBIN: 12.1 g/dL (ref 12.0–15.0)
MCHC: 33.8 g/dL (ref 30.0–36.0)
MCV: 90 fl (ref 78.0–100.0)
PLATELETS: 248 10*3/uL (ref 150.0–400.0)
RBC: 3.97 Mil/uL (ref 3.87–5.11)
RDW: 16.2 % — AB (ref 11.5–15.5)
WBC: 5.3 10*3/uL (ref 4.0–10.5)

## 2014-08-14 NOTE — Telephone Encounter (Signed)
CMP Latest Ref Rng 08/13/2014 06/16/2014 06/16/2014  Glucose 70 - 99 mg/dL 74 148(H) 128(H)  BUN 6 - 23 mg/dL 19 23(H) 22(H)  Creatinine 0.40 - 1.20 mg/dL 1.40(H) 1.52(H) 1.61(H)  Sodium 135 - 145 mEq/L 140 135 134(L)  Potassium 3.5 - 5.1 mEq/L 4.3 4.7 4.3  Chloride 96 - 112 mEq/L 103 98(L) 98(L)  CO2 19 - 32 mEq/L 30 23 25   Calcium 8.4 - 10.5 mg/dL 9.8 9.4 9.6  Total Protein 6.0 - 8.3 g/dL 7.6 7.1 -  Total Bilirubin 0.2 - 1.2 mg/dL 0.5 0.6 -  Alkaline Phos 39 - 117 U/L 59 49 -  AST 0 - 37 U/L 13 18 -  ALT 0 - 35 U/L 14 26 -    CBC Latest Ref Rng 08/13/2014 06/16/2014 06/16/2014  WBC 4.0 - 10.5 K/uL 5.3 9.8 10.9(H)  Hemoglobin 12.0 - 15.0 g/dL 12.1 11.3(L) 11.5(L)  Hematocrit 36.0 - 46.0 % 35.8(L) 35.4(L) 36.3  Platelets 150.0 - 400.0 K/uL 248.0 273 276     Will have my nurse inform pt that lab tests were okay.  No change to current treatment regimen.

## 2014-08-15 NOTE — Telephone Encounter (Signed)
Results have been explained to patient, pt expressed understanding. Nothing further needed.  

## 2014-08-19 ENCOUNTER — Other Ambulatory Visit: Payer: Self-pay

## 2014-08-19 NOTE — Patient Outreach (Signed)
[facility]) Care Management  08/19/2014  Stacey Page 24-Feb-1950 [id number]   Telephone call to patient this date.  Patient has been referred to Health Coach for telephonic disease management.  Patient verbalized acceptance of [facility] Telephonic Disease Management, but she is currently in [place] attending to some personal business.  Appointment made to call patient on September 10, 2014 at Jonna Munro, RN, MSN [place] [phone] Fax [phone]

## 2014-08-29 ENCOUNTER — Telehealth: Payer: Self-pay | Admitting: Internal Medicine

## 2014-08-29 NOTE — Telephone Encounter (Signed)
This patient is a patient of Dr. Doug Sou and she is wondering if you can take on her son and daughter as new patients Please advise

## 2014-08-31 NOTE — Telephone Encounter (Signed)
yes

## 2014-09-01 ENCOUNTER — Ambulatory Visit: Payer: PPO | Admitting: Internal Medicine

## 2014-09-01 NOTE — Telephone Encounter (Signed)
Patient informed. 

## 2014-09-09 ENCOUNTER — Ambulatory Visit: Payer: PPO | Admitting: Internal Medicine

## 2014-09-10 ENCOUNTER — Other Ambulatory Visit: Payer: Self-pay

## 2014-09-10 ENCOUNTER — Ambulatory Visit: Payer: Self-pay

## 2014-09-10 NOTE — Patient Outreach (Signed)
Stacey Page) Care Management  [place]  09/10/2014   Kenny Tiberio [birth year] [id number]  First telephonic assessment completed today with RN Health Coach.  Goals for self-management of CHF and weight loss will be on-going.  Patient self reported today that she is weighing daily and recording weight.  Reports no significant increases in her weight, and no edema of lower extremities.  She is able to discuss the CHF stop light zones and list problems for which she would call the MD or 911.  She is following some recommendations for a low salt diet.  Stacey Page has not started an exercise program.  She is encouraged to start a walking program as tolerated.  Weight loss is a stated goal.  She would like to lose 5 pounds a month.  Her weight was 337 pounds today, up from 334 pounds on 08-12-14.  RN Health Coach will mail dietary guidelines.  She is open to a referral for nutrition classes.  Current Medications:  Current Outpatient Prescriptions  Medication Sig Dispense Refill  . albuterol (PROAIR HFA) 108 (90 BASE) MCG/ACT inhaler Inhale 2 puffs into the lungs every 6 (six) hours as needed for wheezing or shortness of breath. 1 Inhaler 3  . azithromycin (ZITHROMAX) 500 MG tablet Take 500 mg by mouth 3 (three) times a week.    . budesonide-formoterol (SYMBICORT) 160-4.5 MCG/ACT inhaler Inhale 2 puffs into the lungs 2 (two) times daily.    . Clobetasol Prop Emollient Base (CLOBETASOL PROPIONATE E) 0.05 % emollient cream Apply 1 application topically daily as needed (skin irritations).     Marland Kitchen ethambutol (MYAMBUTOL) 400 MG tablet Take 6 tablets (2,400 mg total) by mouth 3 (three) times a week. On Monday-Wednesday-Friday 30 tablet 3  . fluticasone (FLONASE) 50 MCG/ACT nasal spray Place 1 spray into both nostrils daily. 16 g 3  . furosemide (LASIX) 40 MG tablet Take 1 tablet (40 mg total) by mouth daily. 90 tablet 1  . labetalol (NORMODYNE) 200 MG tablet Take 1 tablet (200 mg  total) by mouth 2 (two) times daily. 180 tablet 3  . lisinopril (PRINIVIL,ZESTRIL) 20 MG tablet Take 1 tablet (20 mg total) by mouth daily. 30 tablet 1  . montelukast (SINGULAIR) 10 MG tablet Take 1 tablet (10 mg total) by mouth at bedtime. 30 tablet 3  . potassium citrate (UROCIT-K) 10 MEQ (1080 MG) SR tablet Take 1 tablet (10 mEq total) by mouth daily. 90 tablet 3  . rifampin (RIFADIN) 300 MG capsule Take 2 capsules (600 mg total) by mouth 3 (three) times a week. 30 capsule 3  . HYDROcodone-acetaminophen (NORCO/VICODIN) 5-325 MG per tablet Take 1-2 tablets by mouth every 4 (four) hours as needed for moderate pain. (Patient not taking: Reported on 09/10/2014) 30 tablet 0  . sitaGLIPtin (JANUVIA) 100 MG tablet Take 1 tablet (100 mg total) by mouth daily. 30 tablet 3   No current facility-administered medications for this visit.    Functional Status:  In your present state of health, do you have any difficulty performing the following activities: 09/10/2014 07/01/2014  Hearing? N N  Vision? N Y  Difficulty concentrating or making decisions? N Y  Walking or climbing stairs? Y Y  Dressing or bathing? N Y  Doing errands, shopping? Tempie Donning  Preparing Food and eating ? Y Y  Using the Toilet? N Y  In the past six months, have you accidently leaked urine? Y N  Do you have problems with loss of bowel control? N  N  Managing your Medications? N N  Managing your Finances? N N  Housekeeping or managing your Housekeeping? Tempie Donning    Fall/Depression Screening: PHQ 2/9 Scores 09/10/2014 07/01/2014  PHQ - 2 Score 0 5  PHQ- 9 Score - 11    Assessment: Patient will benefit from monthly calls from [place] for education and support in self-management of her CHF and weight loss.  Plan: Send EMMI dietary materials for low salt and weight loss.           Pursue MD referral for nutrition classes.           Follow-up assessment scheduled for 10-08-14.   Candie Mile, RN, MSN [place] [phone] Fax [phone]

## 2014-09-16 ENCOUNTER — Ambulatory Visit (INDEPENDENT_AMBULATORY_CARE_PROVIDER_SITE_OTHER): Payer: PPO | Admitting: Internal Medicine

## 2014-09-16 ENCOUNTER — Encounter: Payer: Self-pay | Admitting: Internal Medicine

## 2014-09-16 VITALS — BP 148/76 | HR 65 | Temp 98.3°F | Wt 331.2 lb

## 2014-09-16 DIAGNOSIS — N181 Chronic kidney disease, stage 1: Secondary | ICD-10-CM | POA: Diagnosis not present

## 2014-09-16 DIAGNOSIS — A319 Mycobacterial infection, unspecified: Secondary | ICD-10-CM

## 2014-09-16 DIAGNOSIS — N189 Chronic kidney disease, unspecified: Secondary | ICD-10-CM | POA: Insufficient documentation

## 2014-09-16 DIAGNOSIS — N2889 Other specified disorders of kidney and ureter: Secondary | ICD-10-CM

## 2014-09-16 NOTE — Assessment & Plan Note (Signed)
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 I will continue her current regimen and have her follow-up in 3 months.

## 2014-09-16 NOTE — Progress Notes (Signed)
[facility]  Patient Active Problem List   Diagnosis Date Noted  . Atypical mycobacterium infection     Priority: High  . ILD (interstitial lung disease)     Priority: Medium  . Chronic renal insufficiency 09/16/2014  . Pneumothorax on right   . Routine general medical examination at a health care facility 04/03/2014  . Morbid obesity 04/03/2014  . OSA (obstructive sleep apnea) 02/25/2014  . Chronic diastolic heart failure [id number]  . Diabetes mellitus with renal complications [id number]  . Essential hypertension 02/25/2014    Patient's Medications  New Prescriptions   No medications on file  Previous Medications   ALBUTEROL (PROAIR HFA) 108 (90 BASE) MCG/ACT INHALER    Inhale 2 puffs into the lungs every 6 (six) hours as needed for wheezing or shortness of breath.   AZITHROMYCIN (ZITHROMAX) 500 MG TABLET    Take 500 mg by mouth 3 (three) times a week.   BUDESONIDE-FORMOTEROL (SYMBICORT) 160-4.5 MCG/ACT INHALER    Inhale 2 puffs into the lungs 2 (two) times daily.   CLOBETASOL PROP EMOLLIENT BASE (CLOBETASOL PROPIONATE E) 0.05 % EMOLLIENT CREAM    Apply 1 application topically daily as needed (skin irritations).    ETHAMBUTOL (MYAMBUTOL) 400 MG TABLET    Take 6 tablets (2,400 mg total) by mouth 3 (three) times a week. On Monday-Wednesday-Friday   FLUTICASONE (FLONASE) 50 MCG/ACT NASAL SPRAY    Place 1 spray into both nostrils daily.   FUROSEMIDE (LASIX) 40 MG TABLET    Take 1 tablet (40 mg total) by mouth daily.   HYDROCODONE-ACETAMINOPHEN (NORCO/VICODIN) 5-325 MG PER TABLET    Take 1-2 tablets by mouth every 4 (four) hours as needed for moderate pain.   LABETALOL (NORMODYNE) 200 MG TABLET    Take 1 tablet (200 mg total) by mouth 2 (two) times daily.   LISINOPRIL (PRINIVIL,ZESTRIL) 20 MG TABLET    Take 1 tablet (20 mg total) by mouth daily.   MONTELUKAST (SINGULAIR) 10 MG TABLET    Take 1 tablet (10 mg total) by mouth at bedtime.   POTASSIUM  CITRATE (UROCIT-K) 10 MEQ (1080 MG) SR TABLET    Take 1 tablet (10 mEq total) by mouth daily.   RIFAMPIN (RIFADIN) 300 MG CAPSULE    Take 2 capsules (600 mg total) by mouth 3 (three) times a week.   SITAGLIPTIN (JANUVIA) 100 MG TABLET    Take 1 tablet (100 mg total) by mouth daily.  Modified Medications   No medications on file  Discontinued Medications   No medications on file    Subjective: Stacey Page is in for her hospital follow-up visit. She has a very remote history of cigarette use but quit many decades ago. She recalls having pneumonia 2 years ago while she was living in [place]. She was hospitalized for about 1 week and treated with antibiotics for a total of 2 weeks with improvement. She moved here to care for her parents who have since died. She realized that she had not been taking care of herself and had ignored progressive shortness of breath and cough that had been developing over the past few years. She was seen by Dr. Chesley Mires and found to have sleep apnea. She was started on nighttime CPAP but continued to be bothered by shortness of breath. A chest CT scan in February revealed scattered pleural-based nodules and interstitial markings. She was admitted to the hospital in early May and underwent  VATS lung biopsy. Pathologic review revealed necrotizing granulomatous inflammation with acid-fast bacilli. A lung biopsy was not submitted for cultures. The lung biopsy was sent to the [facility] for PCR testing. No nontuberculous mycobacterial DNA was detected. Her QuantiFERON TB gold assay was indeterminate. She was seen by my partner, Dr. Lita Mains, who elected to start her on empiric 3 drug therapy for Mycobacterium avium. She has been taking azithromycin, ethambutol and rifampin every Monday, Wednesday and Friday since discharge. She has had no problems tolerating her antibiotics. She says she is feeling much better. She estimates that her shortness of breath is 80%  improved. She no longer has any cough.  Review of Systems: Pertinent items are noted in HPI.  Past Medical History  Diagnosis Date  . HTN (hypertension)   . Asthma   . Seasonal allergies   . OA (osteoarthritis)   . Interstitial lung disease 03/26/2014  . OSA (obstructive sleep apnea) 02/25/2014    wears CPAP at night  . Anxiety   . Depression   . Diabetes     Type 2  . Acute renal failure May 19, 2014  . GERD (gastroesophageal reflux disease)   . Swelling of ankle     Bilateral; takes Lasix  . Aortic stenosis, mild     mild AS 01/2014 echo  . Shortness of breath on exertion     History  Substance Use Topics  . Smoking status: Former Smoker -- 0.25 packs/day for 15 years    Types: Cigarettes    Quit date: 02/08/1980  . Smokeless tobacco: Not on file     Comment: 2 packs per week  . Alcohol Use: No     Comment: occassional/social/rare    Family History  Problem Relation Age of Onset  . COPD Father   . Cancer Mother     No Known Allergies  Objective: Filed Vitals:   09/16/14 1338  BP: 148/76  Pulse: 65  Temp: 98.3 F (36.8 C)  TempSrc: Oral  Weight: 331 lb 4 oz (150.254 kg)   Body mass index is 50.38 kg/(m^2).  General: She is smiling and in no distress Skin: No rash Lungs: Clear Cor: Regular S1 and S2 with no murmurs     Problem List Items Addressed This Visit      Unprioritized   Chronic renal insufficiency - Primary       Michel Bickers, MD [facility] [phone] pager   [phone] cell 09/16/2014, 2:30 PM

## 2014-09-30 ENCOUNTER — Ambulatory Visit (INDEPENDENT_AMBULATORY_CARE_PROVIDER_SITE_OTHER): Payer: PPO | Admitting: Internal Medicine

## 2014-09-30 ENCOUNTER — Encounter: Payer: Self-pay | Admitting: Internal Medicine

## 2014-09-30 ENCOUNTER — Other Ambulatory Visit (INDEPENDENT_AMBULATORY_CARE_PROVIDER_SITE_OTHER): Payer: PPO

## 2014-09-30 VITALS — BP 130/64 | HR 60 | Temp 97.9°F | Resp 18 | Ht 68.0 in | Wt 328.0 lb

## 2014-09-30 DIAGNOSIS — E099 Drug or chemical induced diabetes mellitus without complications: Secondary | ICD-10-CM

## 2014-09-30 DIAGNOSIS — E0929 Drug or chemical induced diabetes mellitus with other diabetic kidney complication: Secondary | ICD-10-CM | POA: Diagnosis not present

## 2014-09-30 DIAGNOSIS — T380X5A Adverse effect of glucocorticoids and synthetic analogues, initial encounter: Secondary | ICD-10-CM

## 2014-09-30 DIAGNOSIS — A319 Mycobacterial infection, unspecified: Secondary | ICD-10-CM | POA: Diagnosis not present

## 2014-09-30 LAB — BASIC METABOLIC PANEL
BUN: 21 mg/dL (ref 6–23)
CHLORIDE: 102 meq/L (ref 96–112)
CO2: 29 mEq/L (ref 19–32)
Calcium: 10.1 mg/dL (ref 8.4–10.5)
Creatinine, Ser: 1.44 mg/dL — ABNORMAL HIGH (ref 0.40–1.20)
GFR: 47.3 mL/min — ABNORMAL LOW (ref >60.00)
GLUCOSE: 88 mg/dL (ref 70–99)
POTASSIUM: 4.4 meq/L (ref 3.5–5.1)
Sodium: 137 mEq/L (ref 135–145)

## 2014-09-30 LAB — HEMOGLOBIN A1C: Hgb A1c MFr Bld: 5.9 % (ref 4.6–6.5)

## 2014-09-30 NOTE — Progress Notes (Signed)
Pre visit review using our clinic review tool, if applicable. No additional management support is needed unless otherwise documented below in the visit note. 

## 2014-09-30 NOTE — Progress Notes (Signed)
   Subjective:    Patient ID: Stacey Page, female    DOB: 03/01/1950, 64 y.o.   MRN: [id number]  HPI The patient is a 64 YO female coming in for follow up of her steroid induced diabetes (complicated by renal insufficiency), she is now off steroids and is working on losing the weight she gained from the steroids. She is down about 10 pounds since our visit 3 months ago. She wishes to see a nutritionist. She is currently on 3 antibiotics for MAC (infections can increase sugars as well). Breathing is much improved and her tolerance for activity is improving as well. She is working on exercise but not doing a ton yet.   Review of Systems  Constitutional: Positive for activity change. Negative for fever, chills, appetite change, fatigue and unexpected weight change.  Respiratory: Positive for shortness of breath. Negative for cough, chest tightness and wheezing.        Improving  Cardiovascular: Negative for chest pain, palpitations and leg swelling.  Gastrointestinal: Negative for nausea, abdominal pain, diarrhea, constipation and abdominal distention.  Musculoskeletal: Positive for arthralgias.  Skin: Negative.   Neurological: Negative.       Objective:   Physical Exam  Constitutional: She is oriented to person, place, and time. She appears well-developed and well-nourished.  Overewight  HENT:  Head: Normocephalic and atraumatic.  Eyes: EOM are normal.  Cardiovascular: Normal rate and regular rhythm.   Pulmonary/Chest: Effort normal and breath sounds normal. No respiratory distress. She has no wheezes. She has no rales.  Abdominal: Soft. Bowel sounds are normal. She exhibits no distension. There is no tenderness. There is no rebound.  Musculoskeletal: She exhibits no edema.  Neurological: She is alert and oriented to person, place, and time. Coordination normal.  Skin: Skin is warm and dry.  Psychiatric: She has a normal mood and affect.   Filed Vitals:   09/30/14 1303  BP:  130/64  Pulse: 60  Temp: 97.9 F (36.6 C)  TempSrc: Oral  Resp: 18  Height: 5\' 8"  (1.727 m)  Weight: 328 lb (148.78 kg)      Assessment & Plan:

## 2014-09-30 NOTE — Assessment & Plan Note (Signed)
Due to steroids, checking HgA1c today. Taking januvia and ACE-I. Checking BMP for creatinine as well.

## 2014-09-30 NOTE — Assessment & Plan Note (Signed)
Weight down 10 pounds since last visit which is awesome. Helps that she is off the steroids and is getting her MAC infection under control. Encouraged her to keep pushing herself to increase her exercise and work on diet. Referral to nutrition.

## 2014-09-30 NOTE — Assessment & Plan Note (Signed)
Currently on azithromycin, ethambutol, rifampin for MAC. Talked to her today about the chance that this extended course (9 months +) could result in her still having the infection as it is difficult to eradicate.

## 2014-09-30 NOTE — Patient Instructions (Signed)
You are doing great with the weight so keep up the good work! You are down 10 pounds since 3 months ago and are 328 pounds today!  Come back in about 3 months so we can check on the weight, we are checking the diabetes today and will call you with the results.   Diabetes and Exercise Exercising regularly is important. It is not just about losing weight. It has many health benefits, such as:  Improving your overall fitness, flexibility, and endurance.  Increasing your bone density.  Helping with weight control.  Decreasing your body fat.  Increasing your muscle strength.  Reducing stress and tension.  Improving your overall health. People with diabetes who exercise gain additional benefits because exercise:  Reduces appetite.  Improves the body's use of blood sugar (glucose).  Helps lower or control blood glucose.  Decreases blood pressure.  Helps control blood lipids (such as cholesterol and triglycerides).  Improves the body's use of the hormone insulin by:  Increasing the body's insulin sensitivity.  Reducing the body's insulin needs.  Decreases the risk for heart disease because exercising:  Lowers cholesterol and triglycerides levels.  Increases the levels of good cholesterol (such as high-density lipoproteins [HDL]) in the body.  Lowers blood glucose levels. YOUR ACTIVITY PLAN  Choose an activity that you enjoy and set realistic goals. Your health care provider or diabetes educator can help you make an activity plan that works for you. Exercise regularly as directed by your health care provider. This includes:  Performing resistance training twice a week such as push-ups, sit-ups, lifting weights, or using resistance bands.  Performing 150 minutes of cardio exercises each week such as walking, running, or playing sports.  Staying active and spending no more than 90 minutes at one time being inactive. Even short bursts of exercise are good for you. Three  10-minute sessions spread throughout the day are just as beneficial as a single 30-minute session. Some exercise ideas include:  Taking the dog for a walk.  Taking the stairs instead of the elevator.  Dancing to your favorite song.  Doing an exercise video.  Doing your favorite exercise with a friend. RECOMMENDATIONS FOR EXERCISING WITH TYPE 1 OR TYPE 2 DIABETES   Check your blood glucose before exercising. If blood glucose levels are greater than 240 mg/dL, check for urine ketones. Do not exercise if ketones are present.  Avoid injecting insulin into areas of the body that are going to be exercised. For example, avoid injecting insulin into:  The arms when playing tennis.  The legs when jogging.  Keep a record of:  Food intake before and after you exercise.  Expected peak times of insulin action.  Blood glucose levels before and after you exercise.  The type and amount of exercise you have done.  Review your records with your health care provider. Your health care provider will help you to develop guidelines for adjusting food intake and insulin amounts before and after exercising.  If you take insulin or oral hypoglycemic agents, watch for signs and symptoms of hypoglycemia. They include:  Dizziness.  Shaking.  Sweating.  Chills.  Confusion.  Drink plenty of water while you exercise to prevent dehydration or heat stroke. Body water is lost during exercise and must be replaced.  Talk to your health care provider before starting an exercise program to make sure it is safe for you. Remember, almost any type of activity is better than none. Document Released: 04/16/2003 Document Revised: 06/10/2013 Document Reviewed: 07/03/2012  ExitCare Patient Information 2015 [employer]. This information is not intended to replace advice given to you by your health care provider. Make sure you discuss any questions you have with your health care provider.  

## 2014-10-03 ENCOUNTER — Other Ambulatory Visit: Payer: Self-pay | Admitting: Internal Medicine

## 2014-10-03 MED ORDER — GABAPENTIN 300 MG PO CAPS: 300.0000 mg | ORAL_CAPSULE | Freq: Two times a day (BID) | ORAL | Status: DC | PRN

## 2014-10-06 ENCOUNTER — Other Ambulatory Visit: Payer: Self-pay | Admitting: Physician Assistant

## 2014-10-08 ENCOUNTER — Other Ambulatory Visit: Payer: Self-pay

## 2014-10-08 ENCOUNTER — Ambulatory Visit: Payer: Self-pay

## 2014-10-08 DIAGNOSIS — J441 Chronic obstructive pulmonary disease with (acute) exacerbation: Secondary | ICD-10-CM

## 2014-10-08 NOTE — Patient Outreach (Signed)
[facility]) Care Management  10/08/2014  Chelcee Gulbrandson 11-15-1950 [id number]   Request from Candie Mile, RN to assign Pharmacy, assigned Deanne Coffer, PharmD.  Thanks, Ronnell Freshwater. [place], [place] Assistant Phone: [phone] Fax: [phone]

## 2014-10-08 NOTE — Patient Outreach (Addendum)
[facility]) Care Management  [place]  10/08/2014   Stacey Page 1950-05-17 [id number]  Telephonic assessment with patient today for chronic disease management.  Her primary goal today is to continue to lose weight in order to improve her over-all health.  Her weight on June 7 was 332 pounds, July 5 was 334, August 3 was 337, and at the doctor's office last week her weight was 328 pounds.  [facility] and has an appointment on September 27th.  Current Medications:  Current Outpatient Prescriptions  Medication Sig Dispense Refill  . ethambutol (MYAMBUTOL) 400 MG tablet Take 6 tablets (2,400 mg total) by mouth 3 (three) times a week. On Monday-Wednesday-Friday 30 tablet 3  . fluticasone (FLONASE) 50 MCG/ACT nasal spray Place 1 spray into both nostrils daily. 16 g 3  . furosemide (LASIX) 40 MG tablet Take 1 tablet (40 mg total) by mouth daily. 90 tablet 1  . gabapentin (NEURONTIN) 300 MG capsule Take 1 capsule (300 mg total) by mouth 2 (two) times daily as needed. 60 capsule 1  . HYDROcodone-acetaminophen (NORCO/VICODIN) 5-325 MG per tablet Take 1-2 tablets by mouth every 4 (four) hours as needed for moderate pain. 30 tablet 0  . labetalol (NORMODYNE) 200 MG tablet Take 1 tablet (200 mg total) by mouth 2 (two) times daily. 180 tablet 3  . lisinopril (PRINIVIL,ZESTRIL) 20 MG tablet Take 1 tablet (20 mg total) by mouth daily. 30 tablet 1  . montelukast (SINGULAIR) 10 MG tablet Take 1 tablet (10 mg total) by mouth at bedtime. 30 tablet 3  . potassium citrate (UROCIT-K) 10 MEQ (1080 MG) SR tablet Take 1 tablet (10 mEq total) by mouth daily. 90 tablet 3  . rifampin (RIFADIN) 300 MG capsule Take 2 capsules (600 mg total) by mouth 3 (three) times a week. 30 capsule 3  . sitaGLIPtin (JANUVIA) 100 MG tablet Take 1 tablet (100 mg total) by mouth daily. 30 tablet 3  . albuterol (PROAIR HFA) 108 (90 BASE) MCG/ACT inhaler Inhale 2  puffs into the lungs every 6 (six) hours as needed for wheezing or shortness of breath. 1 Inhaler 3  . azithromycin (ZITHROMAX) 500 MG tablet Take 500 mg by mouth 3 (three) times a week.    . budesonide-formoterol (SYMBICORT) 160-4.5 MCG/ACT inhaler Inhale 2 puffs into the lungs 2 (two) times daily.    . Clobetasol Prop Emollient Base (CLOBETASOL PROPIONATE E) 0.05 % emollient cream Apply 1 application topically daily as needed (skin irritations).      No current facility-administered medications for this visit.    Functional Status:  In your present state of health, do you have any difficulty performing the following activities: 10/08/2014 09/10/2014  Hearing? N N  Vision? N N  Difficulty concentrating or making decisions? N N  Walking or climbing stairs? Y Y  Dressing or bathing? N N  Doing errands, shopping? N Y  Conservation officer, nature and eating ? - Y  Using the Toilet? - N  In the past six months, have you accidently leaked urine? - Y  Do you have problems with loss of bowel control? - N  Managing your Medications? - N  Managing your Finances? - N  Housekeeping or managing your Housekeeping? - Y    Fall/Depression Screening: PHQ 2/9 Scores 09/16/2014 09/10/2014 07/01/2014  PHQ - 2 Score 0 0 5  PHQ- 9 Score - - 11   THN CM Care Plan Problem One  Most Recent Value   Care Plan Problem One  Recent admission to the hospital related to lung disease   Role Documenting the Problem One  [place] for Problem One  Active   THN CM Short Term Goal #1 (0-30 days)  Patient will be able to report recording daily weights for the next 7 days.   THN CM Short Term Goal #1 Met Date  09/10/14   THN CM Short Term Goal #2 (0-30 days)  Patient will be able to verbalize walking 3 times a day to the mailbox daily for the next 7 days.   THN CM Short Term Goal #2 Start Date  09/10/14   Interventions for Short Term Goal #2  Encourage her to increase walking as tolerated.    THN CM Care Plan  Problem Two        Most Recent Value   Care Plan Problem Two  Increased BMI and  patient wanting to lose weight.   Role Documenting the Problem Two  [place] for Problem Two  Active   THN CM Short Term Goal #1 (0-30 days)  Patient will be able to verbalize weight loss of 5 pounds in 30 days.   THN CM Short Term Goal #1 Start Date  09/10/14   [facility] CM Short Term Goal #1 Met Date   10/08/14   Interventions for Short Term Goal #2   -- [Reinforced diet teaching,  take food diary to Nutrition appt.]    [facility] CM Care Plan Problem Three        Most Recent Value   Care Plan Problem Three  Difficulty navigating health care system   Role Documenting the Problem [place] for Problem Three  Not Active   THN CM Short Term Goal #1 Met Date  08/12/14 [Patient went to social services and completed applications.]     Assessment: Patient has achieved some success in losing weight.  She has not increased her activity level significantly.  She is no longer on Prednisone, and a recent A1C was 5.9.  The diabetes is thought to have been steroid induced.   Plan: Patient will continue efforts to make healthy food choices and decrease salt intake.           Patient will [facility].           Patient will attempt to increase activity level as tolerated and will pace herself due to shortness                   of breath with exertion.           Referral to pharmacy due to difficulty with medication co-pays and questions about the antibiotics                    she takes for an atypical mycobacterium infection.           Follow-up appointment with Elliott scheduled for 11-06-14.  Candie Mile, RN, MSN [place] [phone] Fax [phone]

## 2014-10-09 ENCOUNTER — Other Ambulatory Visit: Payer: Self-pay

## 2014-10-09 NOTE — Patient Outreach (Signed)
Stacey Page was referred to me by Stacey Mile, RN, telephonic nurse health coach to assist with questions around her antibiotics.  I called Stacey Page and she stated her Ethambutol and Rifampin have gone up in cost.  She stated she did not know why.  I informed her that Ethambutol was considered a Tier 3 agent and Rifampin was considered a Tier 2 agent.  Due to this, both medications will be higher than Tier 1 agents.  There are no cheaper alternatives currently.  I stated I would look into assistance programs to determine if any are available.  She will not qualify for Extra Help due to her income.  I will call her back after I research if there are any assistance program available.  She stated that would be fine.    Deanne Coffer, PharmD, [place] [phone]

## 2014-10-17 ENCOUNTER — Other Ambulatory Visit: Payer: Self-pay | Admitting: Pharmacist

## 2014-10-17 NOTE — Patient Outreach (Signed)
Called patient to follow up on patient assistance programs for rifampin and ethambutol. Informed her that there were no assistance programs available. Patient verbalized understanding and reported that she would be able to purchase both of them on her own.    Nicoletta Ba, PharmD, [facility] [phone]

## 2014-11-04 ENCOUNTER — Encounter: Payer: Self-pay | Admitting: *Deleted

## 2014-11-04 ENCOUNTER — Encounter: Payer: PPO | Attending: Internal Medicine | Admitting: *Deleted

## 2014-11-04 VITALS — Ht 68.0 in | Wt 330.9 lb

## 2014-11-04 DIAGNOSIS — Z713 Dietary counseling and surveillance: Secondary | ICD-10-CM | POA: Diagnosis not present

## 2014-11-04 DIAGNOSIS — E0929 Drug or chemical induced diabetes mellitus with other diabetic kidney complication: Secondary | ICD-10-CM | POA: Diagnosis present

## 2014-11-04 NOTE — Progress Notes (Signed)
Diabetes Self-Management Education  Visit Type: First/Initial  Appt. Start Time: 1400 Appt. End Time: 1600  11/04/2014  [name], identified by name and date of birth, is a 64 y.o. female with a diagnosis of Diabetes: Type 2. Stacey Page is morbidly obese, has an unsteady gait due to her wait and "bad" knees. This challenges her ability to exercise. He also has a history of lung disease making her short of breath. She came to [place] from [place] approximately six months ago. Since that time her daughter and her husband (T2DM) and son have moved in with her due to financial challenges of the daughter. The daughter cooks the meals that are good choices. Unfortunately Stacey Page resents her daughter and is not on board with the type of foods that are being prepared. She now understand that her daughter is actually preparing good meals and portion control need be addressed.  ASSESSMENT  Height 5\' 8"  (1.727 m), weight 330 lb 14.4 oz (150.095 kg). Body mass index is 50.32 kg/(m^2).      Diabetes Self-Management Education - 11/04/14 1438    Visit Information   Visit Type First/Initial   Initial Visit   Diabetes Type Type 2   Are you currently following a meal plan? No   Are you taking your medications as prescribed? Yes   Date Diagnosed 2010   Health Coping   How would you rate your overall health? Fair   Psychosocial Assessment   Patient Belief/Attitude about Diabetes Motivated to manage diabetes   Self-care barriers Unsteady gait/risk for falls   Self-management support Doctor's office;Family;CDE visits   Other persons present Patient   Patient Concerns Nutrition/Meal planning;Healthy Lifestyle;Weight Control   Special Needs None   Preferred Learning Style No preference indicated   Learning Readiness Ready   How often do you need to have someone help you when you read instructions, pamphlets, or other written materials from your doctor or pharmacy? 1 - Never   Complications   Last HgB A1C per patient/outside source 5.7 %   How often do you check your blood sugar? Not recommended by Stacey Page   Have you had a dilated eye exam in the past 12 months? No   Have you had a dental exam in the past 12 months? No   Are you checking your feet? Yes   How many days per week are you checking your feet? 1   Dietary Intake   Breakfast boiled egg, toast X2, cereal special K 3/4C , 2% milk w/splenda   Snack (morning) none   Lunch salade,vegetables,  egg, cheese, ranch dressing, water   Snack (afternoon) nutragrain blueberry bar / sunflower seeds /    Dinner spaghetti with wheat pasta, tomato sauce with sausage, / rotisserie chicken   Snack (evening) none   Beverage(s) water,   Exercise   Exercise Type Light (walking / raking leaves)   How many days per week to you exercise? 6   How many minutes per day do you exercise? 5   Total minutes per week of exercise 30   Patient Education   Previous Diabetes Education Yes (please comment)  [place] 2011   Disease state  Definition of diabetes, type 1 and 2, and the diagnosis of diabetes;Factors that contribute to the development of diabetes   Nutrition management  Role of diet in the treatment of diabetes and the relationship between the three main macronutrients and blood glucose level;Food label reading, portion sizes and measuring food.;Carbohydrate counting;Meal options for control  of blood glucose level and chronic complications.   Physical activity and exercise  Role of exercise on diabetes management, blood pressure control and cardiac health.;Helped patient identify appropriate exercises in relation to his/her diabetes, diabetes complications and other health issue.   Acute complications Taught treatment of hypoglycemia - the 15 rule.   Chronic complications Relationship between chronic complications and blood glucose control;Identified and discussed with patient  current chronic complications;Retinopathy and reason for yearly dilated  eye exams   Psychosocial adjustment Role of stress on diabetes;Worked with patient to identify barriers to care and solutions   Personal strategies to promote health Lifestyle issues that need to be addressed for better diabetes care   Individualized Goals (developed by patient)   Nutrition General guidelines for healthy choices and portions discussed   Physical Activity Exercise 5-7 days per week;30 minutes per day   Medications take my medication as prescribed   Outcomes   Expected Outcomes Demonstrated interest in learning. Expect positive outcomes   Future DMSE PRN   Program Status Completed      Individualized Plan for Diabetes Self-Management Training:   Learning Objective:  Patient will have a greater understanding of diabetes self-management. Patient education plan is to attend individual and/or group sessions per assessed needs and concerns.   Plan:   Patient Instructions  Plan:  Aim for 3 Carb Choices per meal (45 grams) +/- 1 either way  Aim for 0-15 Carbs per snack if hungry  Include protein in moderation with your meals and snacks Consider reading food labels for Total Carbohydrate and Fat Grams of foods Consider  increasing your activity level by walking for 30 minutes daily as tolerated Continue taking medication as directed by MD  Small Baked potatoes OK, cheese, broccoli Egg & cheese sandwich with piece of fruit  Expected Outcomes:  Demonstrated interest in learning. Expect positive outcomes  Education material provided: Living Well with Diabetes, A1C conversion sheet, Meal plan card, My Plate and Snack sheet  If problems or questions, patient to contact team via:  Phone  Future DSME appointment: PRN

## 2014-11-04 NOTE — Patient Instructions (Signed)
Plan:  Aim for 3 Carb Choices per meal (45 grams) +/- 1 either way  Aim for 0-15 Carbs per snack if hungry  Include protein in moderation with your meals and snacks Consider reading food labels for Total Carbohydrate and Fat Grams of foods Consider  increasing your activity level by walking for 30 minutes daily as tolerated Continue taking medication as directed by MD  Small Baked potatoes OK, cheese, broccoli Egg & cheese sandwich with piece of fruit

## 2014-11-05 ENCOUNTER — Other Ambulatory Visit: Payer: Self-pay

## 2014-11-05 ENCOUNTER — Ambulatory Visit: Payer: Self-pay

## 2014-11-05 NOTE — Patient Outreach (Signed)
[facility]) Care Management  [place]  11/05/2014   Stacey Page [birth year] [id number]  Subjective: [facility].  States she plans to decrease salt and carbohydrates in her diet, and increase her activity level by walking at the mall early in the mornings.  States she has been out of town, and has difficulty watching her diet when not at home.  She continues to have shortness of breath with exertion, but reports no exacerbation of respiratory symptoms.  Objective: Patient reported weight is 330 pounds, up 2 pounds in the last month.  Current Medications:  Current Outpatient Prescriptions  Medication Sig Dispense Refill  . albuterol (PROAIR HFA) 108 (90 BASE) MCG/ACT inhaler Inhale 2 puffs into the lungs every 6 (six) hours as needed for wheezing or shortness of breath. 1 Inhaler 3  . azithromycin (ZITHROMAX) 500 MG tablet Take 500 mg by mouth 3 (three) times a week.    . budesonide-formoterol (SYMBICORT) 160-4.5 MCG/ACT inhaler Inhale 2 puffs into the lungs 2 (two) times daily.    . Clobetasol Prop Emollient Base (CLOBETASOL PROPIONATE E) 0.05 % emollient cream Apply 1 application topically daily as needed (skin irritations).     Marland Kitchen ethambutol (MYAMBUTOL) 400 MG tablet Take 6 tablets (2,400 mg total) by mouth 3 (three) times a week. On Monday-Wednesday-Friday 30 tablet 3  . fluticasone (FLONASE) 50 MCG/ACT nasal spray Place 1 spray into both nostrils daily. 16 g 3  . furosemide (LASIX) 40 MG tablet Take 1 tablet (40 mg total) by mouth daily. 90 tablet 1  . gabapentin (NEURONTIN) 300 MG capsule Take 1 capsule (300 mg total) by mouth 2 (two) times daily as needed. 60 capsule 1  . HYDROcodone-acetaminophen (NORCO/VICODIN) 5-325 MG per tablet Take 1-2 tablets by mouth every 4 (four) hours as needed for moderate pain. 30 tablet 0  . labetalol (NORMODYNE) 200 MG tablet Take 1 tablet (200 mg total) by mouth 2  (two) times daily. 180 tablet 3  . lisinopril (PRINIVIL,ZESTRIL) 20 MG tablet Take 1 tablet (20 mg total) by mouth daily. 30 tablet 1  . montelukast (SINGULAIR) 10 MG tablet Take 1 tablet (10 mg total) by mouth at bedtime. 30 tablet 3  . potassium citrate (UROCIT-K) 10 MEQ (1080 MG) SR tablet Take 1 tablet (10 mEq total) by mouth daily. 90 tablet 3  . rifampin (RIFADIN) 300 MG capsule Take 2 capsules (600 mg total) by mouth 3 (three) times a week. 30 capsule 3  . sitaGLIPtin (JANUVIA) 100 MG tablet Take 1 tablet (100 mg total) by mouth daily. 30 tablet 3   No current facility-administered medications for this visit.    Functional Status:  In your present state of health, do you have any difficulty performing the following activities: 11/05/2014 10/08/2014  Hearing? - N  Vision? - N  Difficulty concentrating or making decisions? - N  Walking or climbing stairs? Y Y  Dressing or bathing? - N  Doing errands, shopping? - N  Conservation officer, nature and eating ? - N  Using the Toilet? - N  In the past six months, have you accidently leaked urine? - -  Do you have problems with loss of bowel control? - -  Managing your Medications? - N  Managing your Finances? - N  Housekeeping or managing your Housekeeping? - N    Fall/Depression Screening: PHQ 2/9 Scores 11/04/2014 09/16/2014 09/10/2014 07/01/2014  PHQ - 2 Score 0 0 0 5  PHQ- 9  Score - - - 11   THN CM Care Plan Problem One        Most Recent Value   Care Plan Problem One  Recent admission to the hospital related to lung disease   Role Documenting the Problem One  [place] for Problem One  Active   THN Long Term Goal (31-90 days)  Patient will be able to report no readmission since hospital discharge for 31 days. Discharge date of 06/22/2014   [facility] Long Term Goal Start Date  07/01/14   Interventions for Problem One Long Term Goal  Discussed importance of timely follow up with MD. Reviewed importance of daily weights, following diet and  taking meds as prescribed.   THN CM Short Term Goal #1 (0-30 days)  Patient will be able to report recording daily weights for the next 7 days.   THN CM Short Term Goal #1 Start Date  07/01/14   [facility] CM Short Term Goal #1 Met Date  09/10/14   Interventions for Short Term Goal #1  Patient is recording daily weights.   THN CM Short Term Goal #2 (0-30 days)  Patient will be able to verbalize walking 3 times a day to the mailbox daily for the next 7 days.   THN CM Short Term Goal #2 Start Date  09/10/14   Interventions for Short Term Goal #2  Reactivated goal.  Pt has not yet started walking.  Education and support provided regarding increasing her activity level.      THN CM Care Plan Problem Two        Most Recent Value   Care Plan Problem Two  Increased BMI and  patient wanting to lose weight.   Role Documenting the Problem Two  [place] for Problem Two  Active   Interventions for Problem Two Long Term Goal   -- [Reinforced need to increase activity, make healthy food choi]   THN Long Term Goal (31-90) days  Patient would report weight loss of 12 pounds of weight loss in the next 90 days.   THN Long Term Goal Start Date  08/12/14   THN CM Short Term Goal #1 (0-30 days)  Patient will be able to verbalize weight loss of 5 pounds in 30 days.   THN CM Short Term Goal #1 Start Date  09/10/14   Interventions for Short Term Goal #2   Reinforce  importance of starting slow to lose weight. Provided motivation, Encouraged family  support. Patient wants to exercise 3 times per week early in the mornings or late in the evenings.  Discussed importance of rest periods if needed.   [Will mail EMMI materials and pursue nutrition consult.]    [facility] CM Care Plan Problem Three        Most Recent Value   Care Plan Problem Three  Difficulty navigating health care system   Role Documenting the Problem [place] for Problem Three  Not Active   THN CM Short Term Goal #1 Start Date   07/01/14 [will review progress toward goal at home visit]   [facility] CM Short Term Goal #1 Met Date  08/12/14 [Patient went to social services and completed applications.]   Interventions for Short Term Goal #1  Patient has completed paper work and is waiting to hear about Medicaid eligibility.     Assessment:  Lack of progress on stated goal of weight loss.  Has avoided readmission and ED visits since May.  Plan: Patient will implement walking regimen.           Patient will cut dietary intake of salt and carbohydrates.           Patient will continue daily weights and keep a record.           RN Health Coach will follow-up in approximately one month.  Candie Mile, RN, MSN [place] [phone] Fax [phone]

## 2014-11-09 ENCOUNTER — Other Ambulatory Visit: Payer: Self-pay | Admitting: Cardiothoracic Surgery

## 2014-11-12 ENCOUNTER — Other Ambulatory Visit: Payer: Self-pay | Admitting: Cardiothoracic Surgery

## 2014-11-12 ENCOUNTER — Other Ambulatory Visit: Payer: Self-pay

## 2014-11-12 NOTE — Patient Outreach (Signed)
Documentation of closure of pharmacy program.  Deanne Coffer, PharmD, [place] [phone]

## 2014-12-01 ENCOUNTER — Encounter: Payer: Self-pay | Admitting: Adult Health

## 2014-12-01 ENCOUNTER — Ambulatory Visit (INDEPENDENT_AMBULATORY_CARE_PROVIDER_SITE_OTHER): Payer: PPO | Admitting: Adult Health

## 2014-12-01 VITALS — BP 118/72 | HR 61 | Ht 68.0 in | Wt 331.8 lb

## 2014-12-01 DIAGNOSIS — G4733 Obstructive sleep apnea (adult) (pediatric): Secondary | ICD-10-CM | POA: Diagnosis not present

## 2014-12-01 DIAGNOSIS — J849 Interstitial pulmonary disease, unspecified: Secondary | ICD-10-CM | POA: Diagnosis not present

## 2014-12-01 NOTE — Patient Instructions (Signed)
Continue on CPAP At bedtime  .  Work on weight loss.  Do not drive if sleepy.  Continue on current regimen .  Follow up with ID clinic as planned in 2 weeks  Follow up Dr. Halford Chessman  In 3 months and  As needed

## 2014-12-04 ENCOUNTER — Other Ambulatory Visit: Payer: Self-pay

## 2014-12-04 ENCOUNTER — Ambulatory Visit: Payer: Self-pay

## 2014-12-04 NOTE — Patient Outreach (Signed)
[facility]) Care Management  [place]  12/04/2014   Stacey Page 1950/09/23 [id number]  Telephonic assessment completed today.  Patient reports she has not implemented a regular exercise program.  Activity is limited by shortness of breath on exertion, and knee pain. She wants to start walking at the mall.  Current weight  is 331 pounds.  Patient reports that her daughter cooks and prepares meals.  States she is trying to make healthy food choices.  At her request I will mail EMMI diabetic dietary educational materials for her to share with her daughter.  Patient stated she did not plan to get a flu shot or a pneumonia shot.  Education provided re health benefits of vaccinations, as well as infection prevention strategies.   Current Medications:  Current Outpatient Prescriptions  Medication Sig Dispense Refill  . albuterol (PROAIR HFA) 108 (90 BASE) MCG/ACT inhaler Inhale 2 puffs into the lungs every 6 (six) hours as needed for wheezing or shortness of breath. 1 Inhaler 3  . azithromycin (ZITHROMAX) 500 MG tablet Take 500 mg by mouth 3 (three) times a week.    . budesonide-formoterol (SYMBICORT) 160-4.5 MCG/ACT inhaler Inhale 2 puffs into the lungs 2 (two) times daily.    . Clobetasol Prop Emollient Base (CLOBETASOL PROPIONATE E) 0.05 % emollient cream Apply 1 application topically daily as needed (skin irritations).     Marland Kitchen ethambutol (MYAMBUTOL) 400 MG tablet Take 6 tablets (2,400 mg total) by mouth 3 (three) times a week. On Monday-Wednesday-Friday 30 tablet 3  . fluticasone (FLONASE) 50 MCG/ACT nasal spray Place 1 spray into both nostrils daily. 16 g 3  . furosemide (LASIX) 40 MG tablet Take 1 tablet (40 mg total) by mouth daily. 90 tablet 1  . gabapentin (NEURONTIN) 300 MG capsule Take 1 capsule (300 mg total) by mouth 2 (two) times daily as needed. 60 capsule 1  . labetalol (NORMODYNE) 200 MG tablet Take 1 tablet (200 mg total) by mouth 2 (two) times  daily. 180 tablet 3  . lisinopril (PRINIVIL,ZESTRIL) 20 MG tablet Take 1 tablet (20 mg total) by mouth daily. 30 tablet 1  . montelukast (SINGULAIR) 10 MG tablet Take 1 tablet (10 mg total) by mouth at bedtime. 30 tablet 3  . potassium citrate (UROCIT-K) 10 MEQ (1080 MG) SR tablet Take 1 tablet (10 mEq total) by mouth daily. 90 tablet 3  . sitaGLIPtin (JANUVIA) 100 MG tablet Take 1 tablet (100 mg total) by mouth daily. 30 tablet 3  . HYDROcodone-acetaminophen (NORCO/VICODIN) 5-325 MG per tablet Take 1-2 tablets by mouth every 4 (four) hours as needed for moderate pain. (Patient not taking: Reported on 12/04/2014) 30 tablet 0   No current facility-administered medications for this visit.    Functional Status:  In your present state of health, do you have any difficulty performing the following activities: 12/04/2014 11/05/2014  Hearing? N -  Vision? N -  Difficulty concentrating or making decisions? N -  Walking or climbing stairs? Y Y  Dressing or bathing? N -  Doing errands, shopping? Y -  Conservation officer, nature and eating ? - -  Using the Toilet? - -  In the past six months, have you accidently leaked urine? - -  Do you have problems with loss of bowel control? - -  Managing your Medications? - -  Managing your Finances? - -  Housekeeping or managing your Housekeeping? - -    Fall/Depression Screening: PHQ 2/9 Scores 11/04/2014 09/16/2014 09/10/2014 07/01/2014  PHQ -  2 Score 0 0 0 5  PHQ- 9 Score - - - 11   THN CM Care Plan Problem One        Most Recent Value   Care Plan Problem One  Recent admission to the hospital related to lung disease   Role Documenting the Problem One  [place] for Problem One  Active   THN Long Term Goal (31-90 days)  Patient will be able to report no readmission since hospital discharge for 31 days. Discharge date of 06/22/2014   [facility] Long Term Goal Start Date  07/01/14   [facility] Long Term Goal Met Date  12/04/14   Interventions for Problem One Long Term  Goal  Discussed importance of timely follow up with MD. Reviewed importance of daily weights, following diet and taking meds as prescribed. [Instruction re flu and pneumonia vaccination.]   THN CM Short Term Goal #1 (0-30 days)  Patient will be able to report recording daily weights for the next 7 days.   THN CM Short Term Goal #1 Start Date  07/01/14   [facility] CM Short Term Goal #1 Met Date  09/10/14   Interventions for Short Term Goal #1  Not weighing daily. Education reinforced.   THN CM Short Term Goal #2 (0-30 days)  Patient will be able to verbalize walking 3 times a day to the mailbox daily for the next 7 days.   THN CM Short Term Goal #2 Start Date  09/10/14   Interventions for Short Term Goal #2  Encouraged to start walking program.    [facility] CM Care Plan Problem Two        Most Recent Value   Care Plan Problem Two  Increased BMI and  patient wanting to lose weight.   Role Documenting the Problem Two  [place] for Problem Two  Active   Interventions for Problem Two Long Term Goal   Support provided to implement healthy diet choices and increase activity. [Reinforced need to increase activity, make healthy food choi]   THN Long Term Goal (31-90) days  Patient would report weight loss of 12 pounds of weight loss in the next 90 days.   THN Long Term Goal Start Date  08/12/14   THN CM Short Term Goal #1 (0-30 days)  Patient will be able to verbalize weight loss of 5 pounds in 30 days.   THN CM Short Term Goal #1 Start Date  09/10/14   Interventions for Short Term Goal #2   Reinforce  importance of starting slow to lose weight. Provided motivation, Encouraged family  support. Patient wants to exercise 3 times per week early in the mornings or late in the evenings.  Discussed importance of rest periods if needed.   [Will mail EMMI materials and pursue nutrition consult.]    [facility] CM Care Plan Problem Three        Most Recent Value   Care Plan Problem Three  Difficulty navigating  health care system   Role Documenting the Problem [place] for Problem Three  Not Active   THN CM Short Term Goal #1 Start Date  07/01/14 [will review progress toward goal at home visit]   [facility] CM Short Term Goal #1 Met Date  08/12/14 [Patient went to social services and completed applications.]   Interventions for Short Term Goal #1  Patient has completed paper work and is waiting to hear about Medicaid  eligibility.     Plan: Patient will schedule an appointment with PCP which is due by December.           Patient will increase activity as tolerated.           Patient will monitor her weight and continue to work on weight loss.           RN Health Coach will mail EMMI materials.           Follow-up appointment scheduled for approximately one month.  Candie Mile, RN, MSN [place] [phone] Fax [phone]

## 2014-12-07 NOTE — Assessment & Plan Note (Addendum)
Secondary to MAI  Doing well on current regimen  Cont w/ ID follow up  Once off treatment consider repeat PFT and CT chest

## 2014-12-07 NOTE — Progress Notes (Signed)
Subjective:    Patient ID: Stacey Page, female    DOB: 12/25/1950, 63 y.o.   MRN: [id number]  HPI 64 yo female former smoker with ILD from MAI , asthma, D-CHF and OSA    TESTS: Echo 01/28/14 >> severe LVH, EF 65 to [id number], grade 2 diastolic dysfx, mild AS, mild/mod LA. HST 02/12/14 >> AHI 59.3, SaO2 low 67% Auto CPAP 03/07/14 to 03/09/14 >> used on 3 of 3 nights with average 8 hrs and 8 min. Average AHI is 3.3 with median CPAP 12 cm H2O and 95 th percentile CPAP 14 cm H20. PFT 03/12/14 >> FEV1 2.05 (88%), FEV1% 87, TLC 4.14 (75%), DLCO 52%, no BD HRCT chest 03/20/14 >> multiple small nodules, patchy GGO, mild BTX, air trapping  12/01/14 Follow up : ILD/MAI and OSA  She returns for 3 month follow up .  She is followed by the ID clinic for presumed MAI  She underwent VATS in May that showed necrotizing granulomatous inflammation with AFB. PCR testing was neg for nontuberculous mycobacterial DNA . Quantiferon TB gold assay was indeterminate . She was treated with 3 drug therapy. She did have improvement. She is now on Azithromycin and Ethambutol. She has a  follow up with ID in 2 weeks.  She says she is doing okay. Gets winded at times.  No cough , wheezing or chest pain.  Does have soreness along previous VATS area where chest tube was. Area feels hard. No redness.   She has OSA on CPAP . Says she does well with it.  Denies mask issues or sign daytime sleepiness.  CPAP on autoset -Download last 30 d  shows excellent compliance w/ . AHI 2.9 , min leaks.    Review of Systems Constitutional:   No  weight loss, night sweats,  Fevers, chills,  +fatigue, or  lassitude.  HEENT:   No headaches,  Difficulty swallowing,  Tooth/dental problems, or  Sore throat,                No sneezing, itching, ear ache, nasal congestion, post nasal drip,   CV:  No chest pain,  Orthopnea, PND, swelling in lower extremities, anasarca, dizziness, palpitations, syncope.   GI  No heartburn, indigestion,  abdominal pain, nausea, vomiting, diarrhea, change in bowel habits, loss of appetite, bloody stools.   Resp: .  No chest wall deformity  Skin: no rash or lesions.  GU: no dysuria, change in color of urine, no urgency or frequency.  No flank pain, no hematuria   MS:  No joint pain or swelling.  No decreased range of motion.  No back pain.  Psych:  No change in mood or affect. No depression or anxiety.  No memory loss.         Objective:   Physical Exam  GEN: A/Ox3; pleasant , NAD,morbidly obese  HEENT:  /AT,  EACs-clear, TMs-wnl, NOSE-clear, THROAT-clear, no lesions, no postnasal drip or exudate noted. Class 2-3 MP airway .    NECK:  Supple w/ fair ROM; no JVD; normal carotid impulses w/o bruits; no thyromegaly or nodules palpated; no lymphadenopathy.  RESP  Clear  P & A; w/o, wheezes/ rales/ or rhonchi.no accessory muscle use, no dullness to percussion, scar along right chest wall with no redness   CARD:  RRR, no m/r/g  , tr  peripheral edema, pulses intact, no cyanosis or clubbing.  GI:   Soft & nt; nml bowel sounds; no organomegaly or masses detected.  Musco: Warm bil, no  deformities or joint swelling noted.   Neuro: alert, no focal deficits noted.    Skin: Warm, no lesions or rashes        Assessment & Plan:

## 2014-12-07 NOTE — Assessment & Plan Note (Signed)
Weight loss encouraged 

## 2014-12-07 NOTE — Assessment & Plan Note (Addendum)
Continue on CPAP At bedtime  .  Work on weight loss.  Do not drive if sleepy.  Follow up Dr. Halford Chessman  In 3 months and  As needed

## 2014-12-16 ENCOUNTER — Other Ambulatory Visit: Payer: Self-pay | Admitting: Internal Medicine

## 2014-12-16 ENCOUNTER — Ambulatory Visit (INDEPENDENT_AMBULATORY_CARE_PROVIDER_SITE_OTHER): Payer: PPO | Admitting: Internal Medicine

## 2014-12-16 ENCOUNTER — Ambulatory Visit: Payer: PPO | Admitting: Internal Medicine

## 2014-12-16 DIAGNOSIS — A319 Mycobacterial infection, unspecified: Secondary | ICD-10-CM

## 2014-12-16 LAB — COMPREHENSIVE METABOLIC PANEL
ALK PHOS: 84 U/L (ref 33–130)
ALT: 34 U/L — AB (ref 6–29)
AST: 27 U/L (ref 10–35)
Albumin: 4.2 g/dL (ref 3.6–5.1)
BUN: 24 mg/dL (ref 7–25)
CO2: 28 mmol/L (ref 20–31)
CREATININE: 1.42 mg/dL — AB (ref 0.50–0.99)
Calcium: 10.1 mg/dL (ref 8.6–10.4)
Chloride: 104 mmol/L (ref 98–110)
GLUCOSE: 89 mg/dL (ref 65–99)
Potassium: 4.7 mmol/L (ref 3.5–5.3)
SODIUM: 138 mmol/L (ref 135–146)
TOTAL PROTEIN: 7.6 g/dL (ref 6.1–8.1)
Total Bilirubin: 0.5 mg/dL (ref 0.2–1.2)

## 2014-12-16 LAB — CBC
HCT: 40.3 % (ref 36.0–46.0)
Hemoglobin: 13.4 g/dL (ref 12.0–15.0)
MCH: 29.1 pg (ref 26.0–34.0)
MCHC: 33.3 g/dL (ref 30.0–36.0)
MCV: 87.6 fL (ref 78.0–100.0)
MPV: 10.5 fL (ref 8.6–12.4)
PLATELETS: 210 10*3/uL (ref 150–400)
RBC: 4.6 MIL/uL (ref 3.87–5.11)
RDW: 16 % — AB (ref 11.5–15.5)
WBC: 4 10*3/uL (ref 4.0–10.5)

## 2014-12-16 MED ORDER — AZITHROMYCIN 500 MG PO TABS: 500.0000 mg | ORAL_TABLET | ORAL | Status: DC

## 2014-12-16 MED ORDER — ETHAMBUTOL HCL 400 MG PO TABS: 2400.0000 mg | ORAL_TABLET | ORAL | Status: DC

## 2014-12-16 MED ORDER — RIFAMPIN 300 MG PO CAPS: 600.0000 mg | ORAL_CAPSULE | ORAL | Status: DC

## 2014-12-16 NOTE — Progress Notes (Signed)
[place] for Infectious Disease  Patient Active Problem List   Diagnosis Date Noted  . Atypical mycobacterium infection     Priority: High  . ILD (interstitial lung disease) ([place])     Priority: Medium  . Chronic renal insufficiency 09/16/2014  . Pneumothorax on right   . Routine general medical examination at a health care facility 04/03/2014  . Morbid obesity ([place]) 04/03/2014  . OSA (obstructive sleep apnea) 02/25/2014  . Chronic diastolic heart failure ([place]) 02/25/2014  . Diabetes mellitus with renal complications ([place]) [id number]  . Essential hypertension 02/25/2014    Patient's Medications  New Prescriptions   No medications on file  Previous Medications   ALBUTEROL (PROAIR HFA) 108 (90 BASE) MCG/ACT INHALER    Inhale 2 puffs into the lungs every 6 (six) hours as needed for wheezing or shortness of breath.   BUDESONIDE-FORMOTEROL (SYMBICORT) 160-4.5 MCG/ACT INHALER    Inhale 2 puffs into the lungs 2 (two) times daily.   CLOBETASOL PROP EMOLLIENT BASE (CLOBETASOL PROPIONATE E) 0.05 % EMOLLIENT CREAM    Apply 1 application topically daily as needed (skin irritations).    FLUTICASONE (FLONASE) 50 MCG/ACT NASAL SPRAY    Place 1 spray into both nostrils daily.   FUROSEMIDE (LASIX) 40 MG TABLET    Take 1 tablet (40 mg total) by mouth daily.   GABAPENTIN (NEURONTIN) 300 MG CAPSULE    Take 1 capsule (300 mg total) by mouth 2 (two) times daily as needed.   HYDROCODONE-ACETAMINOPHEN (NORCO/VICODIN) 5-325 MG PER TABLET    Take 1-2 tablets by mouth every 4 (four) hours as needed for moderate pain.   LABETALOL (NORMODYNE) 200 MG TABLET    Take 1 tablet (200 mg total) by mouth 2 (two) times daily.   LISINOPRIL (PRINIVIL,ZESTRIL) 20 MG TABLET    Take 1 tablet (20 mg total) by mouth daily.   MONTELUKAST (SINGULAIR) 10 MG TABLET    Take 1 tablet (10 mg total) by mouth at bedtime.   POTASSIUM CITRATE (UROCIT-K) 10 MEQ (1080 MG) SR TABLET    Take 1 tablet (10 mEq total) by mouth  daily.   SITAGLIPTIN (JANUVIA) 100 MG TABLET    Take 1 tablet (100 mg total) by mouth daily.  Modified Medications   Modified Medication Previous Medication   AZITHROMYCIN (ZITHROMAX) 500 MG TABLET azithromycin (ZITHROMAX) 500 MG tablet      Take 1 tablet (500 mg total) by mouth 3 (three) times a week.    Take 500 mg by mouth 3 (three) times a week.   ETHAMBUTOL (MYAMBUTOL) 400 MG TABLET ethambutol (MYAMBUTOL) 400 MG tablet      Take 6 tablets (2,400 mg total) by mouth 3 (three) times a week. On Monday-Wednesday-Friday    Take 6 tablets (2,400 mg total) by mouth 3 (three) times a week. On Monday-Wednesday-Friday   RIFAMPIN (RIFADIN) 300 MG CAPSULE rifampin (RIFADIN) 300 MG capsule      Take 2 capsules (600 mg total) by mouth 3 (three) times a week.    Take 2 capsules (600 mg total) by mouth 3 (three) times a week.  Discontinued Medications   No medications on file    Subjective: Stacey Page is in for her routine follow-up visit. She is now completed 6 months of therapy for Mycobacterium avium pneumonia. She is feeling better. She continues to note improvement in her dyspnea on exertion. She has no cough and no sputum production. She's not had any fever, chills or  sweats. She's had no problem tolerating her antibiotics. She's had no visual change. Last month her pharmacy told her that she did not need her rifampin any longer. She is currently only taking azithromycin and ethambutol.  Review of Systems: Review of Systems  Constitutional: Negative for fever, chills, weight loss and diaphoresis.  HENT: Negative for sore throat.   Eyes:       She's had no change in her vision. Specifically, she's had no problem with color vision. She recently got new glasses.  Respiratory: Positive for shortness of breath. Negative for cough, hemoptysis and sputum production.   Cardiovascular: Negative for chest pain.  Gastrointestinal: Negative for nausea, vomiting, abdominal pain and diarrhea.  Skin: Negative  for rash.    Past Medical History  Diagnosis Date  . HTN (hypertension)   . Asthma   . Seasonal allergies   . OA (osteoarthritis)   . Interstitial lung disease ([place]) 03/26/2014  . OSA (obstructive sleep apnea) 02/25/2014    wears CPAP at night  . Anxiety   . Depression   . Diabetes ([place])     Type 2  . Acute renal failure [facility]) May 19, 2014  . GERD (gastroesophageal reflux disease)   . Swelling of ankle     Bilateral; takes Lasix  . Aortic stenosis, mild     mild AS 01/2014 echo  . Shortness of breath on exertion   . COPD (chronic obstructive pulmonary disease) ([place])     Social History  Substance Use Topics  . Smoking status: Former Smoker -- 0.25 packs/day for 15 years    Types: Cigarettes    Quit date: 02/08/1980  . Smokeless tobacco: Not on file     Comment: 2 packs per week  . Alcohol Use: No     Comment: occassional/social/rare    Family History  Problem Relation Age of Onset  . COPD Father   . Cancer Mother     No Known Allergies  Objective: Filed Vitals:   12/16/14 1015  Height: 5\' 8"  (1.727 m)  Weight: 327 lb 12.8 oz (148.689 kg)   Body mass index is 49.85 kg/(m^2).  Physical Exam  Constitutional:  She is well dressed and in good spirits.  HENT:  Mouth/Throat: Oropharyngeal exudate present.  Cardiovascular: Normal rate, regular rhythm and normal heart sounds.   No murmur heard. Pulmonary/Chest: Effort normal and breath sounds normal. No respiratory distress. She has no wheezes. She has no rales.  Skin: No rash noted.  Psychiatric: Affect normal.    Lab Results    Problem List Items Addressed This Visit      High   Atypical mycobacterium infection    She is improving on empiric therapy for probable Mycobacterium avium pneumonia. She needs 6 more months of therapy. I will restart rifampin and continue azithromycin and ethambutol. I will check CBC and complete metabolic panel today. She will follow-up with me in 3 months.      Relevant  Medications   azithromycin (ZITHROMAX) 500 MG tablet   Other Relevant Orders   CBC   Comprehensive metabolic panel       Michel Bickers, MD [facility] for [place] [phone] pager   [phone] cell 12/16/2014, 11:13 AM

## 2014-12-16 NOTE — Assessment & Plan Note (Signed)
She is improving on empiric therapy for probable Mycobacterium avium pneumonia. She needs 6 more months of therapy. I will restart rifampin and continue azithromycin and ethambutol. I will check CBC and complete metabolic panel today. She will follow-up with me in 3 months.

## 2015-01-06 ENCOUNTER — Ambulatory Visit: Payer: PPO | Admitting: Internal Medicine

## 2015-01-07 ENCOUNTER — Other Ambulatory Visit: Payer: Self-pay

## 2015-01-07 NOTE — Patient Outreach (Signed)
[facility]) Care Management  01/07/2015  Shalyn Derocco [birth year] [id number]   Telephone call for scheduled appointment.  Patient answered her cell phone, but stated she was in [place] visiting family.  She plans to return home December 1st.    Rescheduled appointment for Tuesday, December 6th.  Candie Mile, RN, MSN [place] [phone] Fax [phone]

## 2015-01-12 ENCOUNTER — Ambulatory Visit (INDEPENDENT_AMBULATORY_CARE_PROVIDER_SITE_OTHER): Payer: PPO | Admitting: Internal Medicine

## 2015-01-12 ENCOUNTER — Encounter: Payer: Self-pay | Admitting: Internal Medicine

## 2015-01-12 ENCOUNTER — Other Ambulatory Visit: Payer: Self-pay | Admitting: Internal Medicine

## 2015-01-12 VITALS — BP 132/70 | HR 63 | Temp 98.0°F | Resp 16 | Ht 68.0 in | Wt 330.0 lb

## 2015-01-12 DIAGNOSIS — I10 Essential (primary) hypertension: Secondary | ICD-10-CM

## 2015-01-12 DIAGNOSIS — T380X1A Poisoning by glucocorticoids and synthetic analogues, accidental (unintentional), initial encounter: Secondary | ICD-10-CM

## 2015-01-12 DIAGNOSIS — N183 Chronic kidney disease, stage 3 unspecified: Secondary | ICD-10-CM

## 2015-01-12 DIAGNOSIS — E099 Drug or chemical induced diabetes mellitus without complications: Secondary | ICD-10-CM

## 2015-01-12 DIAGNOSIS — Z23 Encounter for immunization: Secondary | ICD-10-CM

## 2015-01-12 DIAGNOSIS — T380X5A Adverse effect of glucocorticoids and synthetic analogues, initial encounter: Secondary | ICD-10-CM

## 2015-01-12 MED ORDER — LISINOPRIL 20 MG PO TABS: 20.0000 mg | ORAL_TABLET | Freq: Every day | ORAL | Status: DC

## 2015-01-12 MED ORDER — FUROSEMIDE 40 MG PO TABS: ORAL_TABLET | ORAL | Status: DC

## 2015-01-12 MED ORDER — SITAGLIPTIN PHOSPHATE 100 MG PO TABS: 100.0000 mg | ORAL_TABLET | Freq: Every day | ORAL | Status: DC

## 2015-01-12 NOTE — Patient Instructions (Signed)
We have given you the flu shot and the pneumonia shot today.   We are not checking labs today.

## 2015-01-12 NOTE — Progress Notes (Signed)
Pre visit review using our clinic review tool, if applicable. No additional management support is needed unless otherwise documented below in the visit note. 

## 2015-01-13 ENCOUNTER — Other Ambulatory Visit: Payer: Self-pay

## 2015-01-13 VITALS — Wt 330.0 lb

## 2015-01-13 DIAGNOSIS — J449 Chronic obstructive pulmonary disease, unspecified: Secondary | ICD-10-CM

## 2015-01-13 NOTE — Assessment & Plan Note (Signed)
Have encouraged her that she needs to be more active to build back up her strength and to continue with her weight loss efforts for her long term health. She is working on it.

## 2015-01-13 NOTE — Patient Outreach (Signed)
[facility] [facility]) Care Management  [place]  01/13/2015   Stacey Page 28-Jan-1951 [id number]  Telephone contact with patient.  She has returned home from her trip to [place].  States she saw MD yesterday and received her flu shot and a pneumonia shot.  Also states she discussed with MD her need to lose weight and increase her activity.  States she will implement diet and exercise program after Christmas holidays.  Current Medications:  Current Outpatient Prescriptions  Medication Sig Dispense Refill  . albuterol (PROAIR HFA) 108 (90 BASE) MCG/ACT inhaler Inhale 2 puffs into the lungs every 6 (six) hours as needed for wheezing or shortness of breath. 1 Inhaler 3  . azithromycin (ZITHROMAX) 500 MG tablet Take 1 tablet (500 mg total) by mouth 3 (three) times a week. 15 tablet 6  . budesonide-formoterol (SYMBICORT) 160-4.5 MCG/ACT inhaler Inhale 2 puffs into the lungs 2 (two) times daily.    . Clobetasol Prop Emollient Base (CLOBETASOL PROPIONATE E) 0.05 % emollient cream Apply 1 application topically daily as needed (skin irritations).     Marland Kitchen ethambutol (MYAMBUTOL) 400 MG tablet Take 6 tablets (2,400 mg total) by mouth 3 (three) times a week. On Monday-Wednesday-Friday 30 tablet 6  . fluticasone (FLONASE) 50 MCG/ACT nasal spray Place 1 spray into both nostrils daily. 16 g 3  . furosemide (LASIX) 40 MG tablet TAKE 1 TABLET (40 MG TOTAL) BY MOUTH DAILY. 90 tablet 3  . gabapentin (NEURONTIN) 300 MG capsule Take 1 capsule (300 mg total) by mouth 2 (two) times daily as needed. 60 capsule 1  . HYDROcodone-acetaminophen (NORCO/VICODIN) 5-325 MG per tablet Take 1-2 tablets by mouth every 4 (four) hours as needed for moderate pain. (Patient not taking: Reported on 12/04/2014) 30 tablet 0  . labetalol (NORMODYNE) 200 MG tablet Take 1 tablet (200 mg total) by mouth 2 (two) times daily. 180 tablet 3  . lisinopril (PRINIVIL,ZESTRIL) 20 MG tablet Take 1 tablet (20 mg total) by mouth daily.  90 tablet 3  . montelukast (SINGULAIR) 10 MG tablet TAKE 1 TABLET (10 MG TOTAL) BY MOUTH AT BEDTIME. 30 tablet 3  . potassium citrate (UROCIT-K) 10 MEQ (1080 MG) SR tablet Take 1 tablet (10 mEq total) by mouth daily. 90 tablet 3  . rifampin (RIFADIN) 300 MG capsule Take 2 capsules (600 mg total) by mouth 3 (three) times a week. 30 capsule 6  . sitaGLIPtin (JANUVIA) 100 MG tablet Take 1 tablet (100 mg total) by mouth daily. 30 tablet 3   No current facility-administered medications for this visit.    THN CM Care Plan Problem One        Most Recent Value   Care Plan Problem One  Recent admission to the hospital related to lung disease   Role Documenting the Problem One  [place] for Problem One  Active   THN Long Term Goal (31-90 days)  Patient will be able to report no readmission since hospital discharge for 31 days. Discharge date of 06/22/2014   [facility] Long Term Goal Start Date  07/01/14   [facility] Long Term Goal Met Date  12/04/14   Interventions for Problem One Long Term Goal  Discussed importance of timely follow up with MD. Reviewed importance of daily weights, following diet and taking meds as prescribed. [Instruction re flu and pneumonia vaccination.]   THN CM Short Term Goal #1 (0-30 days)  Patient will be able to report recording daily weights for the next 7  days.   [facility] CM Short Term Goal #1 Start Date  07/01/14   [facility] CM Short Term Goal #1 Met Date  09/10/14   Interventions for Short Term Goal #1  Weighs most days.   THN CM Short Term Goal #2 (0-30 days)  Patient will be able to verbalize walking 3 times a day to the mailbox daily for the next 7 days.   THN CM Short Term Goal #2 Start Date  09/10/14   Interventions for Short Term Goal #2  Plans exercise program to start after Christmas.    THN CM Care Plan Problem Two        Most Recent Value   Care Plan Problem Two  Increased BMI and  patient wanting to lose weight.   Role Documenting the Problem Two  [place] for Problem Two  Active   Interventions for Problem Two Long Term Goal   Support provided to implement healthy diet choices and increase activity. [Reinforced need to increase activity, make healthy food choi]   THN Long Term Goal (31-90) days  Patient would report weight loss of 12 pounds of weight loss in the next 90 days.   THN Long Term Goal Start Date  08/12/14   THN CM Short Term Goal #1 (0-30 days)  Patient will be able to verbalize weight loss of 5 pounds in 30 days.   THN CM Short Term Goal #1 Start Date  09/10/14   Interventions for Short Term Goal #2   Wt remains at 330 lbs. Supported her stated plan for wt. loss. [Will mail EMMI materials and pursue nutrition consult.]    [facility] CM Care Plan Problem Three        Most Recent Value   Care Plan Problem Three  Difficulty navigating health care system   Role Documenting the Problem [place] for Problem Three  Not Active   THN CM Short Term Goal #1 Start Date  07/01/14 [will review progress toward goal at home visit]   [facility] CM Short Term Goal #1 Met Date  08/12/14 [Patient went to social services and completed applications.]   Interventions for Short Term Goal #1  Patient has completed paper work and is waiting to hear about Medicaid eligibility.       Assessment: No recent COPD exacerbations.  Patient demonstrates knowledge of COPD Action Plan.  Patient has made no progress in plan to lose weight and increase her activity level.  Plan: Patient will continue to monitor weight.           Patient will review EMMI materials on diabetic dietary guidelines.           Patient will implement exercise program.           [place] will resend [facility] Materials which patient reports she no longer has.           RN Health Coach will follow up in approximately one month.  Candie Mile, RN, MSN [place] [phone] Fax [phone]

## 2015-01-13 NOTE — Assessment & Plan Note (Signed)
She is controlled taking [place] and thankfully she is off the steroids for now. She is not able to take metformin due to her CKD which is stable. No neuropathy at this time. Will check HgA1c at next visit since so good last visit and glucose controlled on last blood work. She is still working on weight loss and I encouraged that. Now that her lungs are improving/stable she should start exercising some. She is on ACE-I.

## 2015-01-13 NOTE — Assessment & Plan Note (Signed)
Recent BMP stable kidney function. She will be getting monitored with ID every 3 months due to her antibiotic regimen. BP and diabetes well controlled.

## 2015-01-13 NOTE — Progress Notes (Signed)
   Subjective:    Patient ID: Stacey Page, female    DOB: July 11, 1950, 64 y.o.   MRN: [id number]  HPI The patient is a 64 YO female coming in for follow up of her weight and her steroid induced diabetes. She denies complications from the diabetes although she does have renal insufficiency. She is doing well on [place] and is down about 2 pounds since last visit. She is not exercising at all due to her lung condition (treated for MAC right now). She is on antibiotics for another 6 months but thankfully she is not on steroids any longer. Feels like some of her breathing is gradually improving. No low sugars and no high sugars (>180) at home.   Review of Systems  Constitutional: Positive for activity change. Negative for fever, chills, appetite change, fatigue and unexpected weight change.  Respiratory: Positive for shortness of breath. Negative for cough, chest tightness and wheezing.        Improving  Cardiovascular: Negative for chest pain, palpitations and leg swelling.  Gastrointestinal: Negative for nausea, abdominal pain, diarrhea, constipation and abdominal distention.  Musculoskeletal: Positive for arthralgias.  Skin: Negative.   Neurological: Negative.       Objective:   Physical Exam  Constitutional: She is oriented to person, place, and time. She appears well-developed and well-nourished.  Overweight  HENT:  Head: Normocephalic and atraumatic.  Eyes: EOM are normal.  Cardiovascular: Normal rate and regular rhythm.   Pulmonary/Chest: Effort normal and breath sounds normal. No respiratory distress.  Some mild crackles, not new  Abdominal: Soft. Bowel sounds are normal. She exhibits no distension. There is no tenderness. There is no rebound.  Musculoskeletal: She exhibits no edema.  Neurological: She is alert and oriented to person, place, and time. Coordination normal.  Skin: Skin is warm and dry.  Psychiatric: She has a normal mood and affect.   Filed Vitals:   01/12/15  1448  BP: 132/70  Pulse: 63  Temp: 98 F (36.7 C)  TempSrc: Oral  Resp: 16  Height: 5\' 8"  (1.727 m)  Weight: 330 lb (149.687 kg)  SpO2: 98%      Assessment & Plan:  Flu and pneumonia 23 shot given at visit.

## 2015-01-13 NOTE — Assessment & Plan Note (Signed)
BP at goal on labetalol and lisinopril and lasix. Last BMP reviewed and no indication for change. Stable CKD stage 3.

## 2015-02-11 ENCOUNTER — Other Ambulatory Visit: Payer: Self-pay

## 2015-02-11 VITALS — Wt 325.4 lb

## 2015-02-11 DIAGNOSIS — J449 Chronic obstructive pulmonary disease, unspecified: Secondary | ICD-10-CM

## 2015-02-11 NOTE — Patient Outreach (Signed)
[facility]) Care Management  [facility] Care Manager  02/11/2015   Stacey Page [birth year] [id number]  Patient reports no acute respiratory problems.  Remains short of breath with exertion, but is trying to increase her activity tolerance.  Patient reports she is eating a healthier diet; she and her daughter (who does most of the cooking) have used the educational materials sent to them to make healthier food choices.  She is very proud of the 4.6 pound loss she achieved in the last month.  Current Medications:  Current Outpatient Prescriptions  Medication Sig Dispense Refill  . albuterol (PROAIR HFA) 108 (90 BASE) MCG/ACT inhaler Inhale 2 puffs into the lungs every 6 (six) hours as needed for wheezing or shortness of breath. 1 Inhaler 3  . azithromycin (ZITHROMAX) 500 MG tablet Take 1 tablet (500 mg total) by mouth 3 (three) times a week. 15 tablet 6  . budesonide-formoterol (SYMBICORT) 160-4.5 MCG/ACT inhaler Inhale 2 puffs into the lungs 2 (two) times daily.    . Clobetasol Prop Emollient Base (CLOBETASOL PROPIONATE E) 0.05 % emollient cream Apply 1 application topically daily as needed (skin irritations).     Marland Kitchen ethambutol (MYAMBUTOL) 400 MG tablet Take 6 tablets (2,400 mg total) by mouth 3 (three) times a week. On Monday-Wednesday-Friday 30 tablet 6  . fluticasone (FLONASE) 50 MCG/ACT nasal spray Place 1 spray into both nostrils daily. 16 g 3  . furosemide (LASIX) 40 MG tablet TAKE 1 TABLET (40 MG TOTAL) BY MOUTH DAILY. 90 tablet 3  . gabapentin (NEURONTIN) 300 MG capsule Take 1 capsule (300 mg total) by mouth 2 (two) times daily as needed. 60 capsule 1  . HYDROcodone-acetaminophen (NORCO/VICODIN) 5-325 MG per tablet Take 1-2 tablets by mouth every 4 (four) hours as needed for moderate pain. (Patient not taking: Reported on 12/04/2014) 30 tablet 0  . labetalol (NORMODYNE) 200 MG tablet Take 1 tablet (200 mg total) by mouth 2 (two) times daily. 180 tablet 3  . lisinopril  (PRINIVIL,ZESTRIL) 20 MG tablet Take 1 tablet (20 mg total) by mouth daily. 90 tablet 3  . montelukast (SINGULAIR) 10 MG tablet TAKE 1 TABLET (10 MG TOTAL) BY MOUTH AT BEDTIME. 30 tablet 3  . potassium citrate (UROCIT-K) 10 MEQ (1080 MG) SR tablet Take 1 tablet (10 mEq total) by mouth daily. 90 tablet 3  . rifampin (RIFADIN) 300 MG capsule Take 2 capsules (600 mg total) by mouth 3 (three) times a week. 30 capsule 6  . sitaGLIPtin (JANUVIA) 100 MG tablet Take 1 tablet (100 mg total) by mouth daily. 30 tablet 3   No current facility-administered medications for this visit.    THN CM Care Plan Problem One        Most Recent Value   Care Plan Problem One  Recent admission to the hospital related to lung disease   Role Documenting the Problem One  [place] for Problem One  Active   THN Long Term Goal (31-90 days)  Patient will be able to report no readmission since hospital discharge for 31 days. Discharge date of 06/22/2014   [facility] Long Term Goal Start Date  07/01/14   [facility] Long Term Goal Met Date  12/04/14   Interventions for Problem One Long Term Goal  Discussed importance of timely follow up with MD. Reviewed importance of daily weights, following diet and taking meds as prescribed. [Instruction re flu and pneumonia vaccination.]   THN CM Short Term Goal #1 (0-30 days)  Patient  will be able to report recording daily weights for the next 7 days.   THN CM Short Term Goal #1 Start Date  07/01/14   [facility] CM Short Term Goal #1 Met Date  09/10/14   Interventions for Short Term Goal #1  Weighs most days.   THN CM Short Term Goal #2 (0-30 days)  Patient will be able to verbalize walking 3 times a day to the mailbox daily for the next 7 days.   THN CM Short Term Goal #2 Start Date  09/10/14   Interventions for Short Term Goal #2  Reports increasing activity in the home, and went to the mall to walk.    THN CM Care Plan Problem Two        Most Recent Value   Care Plan Problem Two  Increased  BMI and  patient wanting to lose weight.   Role Documenting the Problem [place]  [place] for Problem Two  Active   Interventions for Problem Two Long Term Goal   Discussed committment to long term weight loss goal. [Reinforced need to increase activity, make healthy food choi]   THN Long Term Goal (31-90) days  Patient would report weight loss of 12 pounds of weight loss in the next 90 days.   THN Long Term Goal Start Date  08/12/14   THN CM Short Term Goal #1 (0-30 days)  Patient will be able to verbalize weight loss of 5 pounds in 30 days.   THN CM Short Term Goal #1 Start Date  09/10/14   Interventions for Short Term Goal #2   4.6 lb. loss in past month.  Praised her success and reinforced healthy choices. [Will mail EMMI materials and pursue nutrition consult.]    [facility] CM Care Plan Problem Three        Most Recent Value   Care Plan Problem Three  Difficulty navigating health care system   Role Documenting the Problem [place] for Problem Three  Not Active   THN CM Short Term Goal #1 Start Date  07/01/14 [will review progress toward goal at home visit]   [facility] CM Short Term Goal #1 Met Date  08/12/14 [Patient went to social services and completed applications.]   Interventions for Short Term Goal #1  Patient has completed paper work and is waiting to hear about Medicaid eligibility.      Fall screening: Patient reports no falls.   Assessment: Patient appears to be working on her goals to loose weight and increase her activity level.  Plan: Patient will continue to make healthy food choices to achieve her weight loss goals.           Patient will make appointment with a podiatrist to evaluate an ingrown toenail.           Patient will continue to increase her activity level as tolerated.           RN Health Coach will follow up within one month.  Candie Mile, RN, MSN [place] [phone] Fax [phone]

## 2015-03-08 DIAGNOSIS — G4733 Obstructive sleep apnea (adult) (pediatric): Secondary | ICD-10-CM | POA: Diagnosis not present

## 2015-03-11 ENCOUNTER — Other Ambulatory Visit: Payer: Self-pay

## 2015-03-11 VITALS — Wt 324.0 lb

## 2015-03-11 DIAGNOSIS — J449 Chronic obstructive pulmonary disease, unspecified: Secondary | ICD-10-CM

## 2015-03-11 NOTE — Patient Outreach (Signed)
[place] [facility]) Care Management  03/11/2015  Stacey Page [birth year] [id number]  Scheduled telephone contact today.  Patient reports no change in respiratory status. She did go to a podiatrist to resolve a problem with an ingrown toenail, but she states it is still causing some discomfort.  Patient is continuing to work on making healthy food choices.  She and her daughter have used the EMMI materials sent to them to plan healthy meals.  She also states that she has started going to the gym with her sister, which is helping her increase her activity level.  Her weight has dropped another pound since our last conversation.  Advice provided on reordering CPAP supplies.  Plan:  Patient will continue to address healthy food choices and increased activity to assist her in her weight loss goal.           [place] will follow up in approximately one month.  Candie Mile, RN, MSN [place] [phone] Fax [phone]

## 2015-03-12 ENCOUNTER — Other Ambulatory Visit: Payer: Self-pay | Admitting: Internal Medicine

## 2015-03-19 ENCOUNTER — Encounter: Payer: Self-pay | Admitting: Internal Medicine

## 2015-03-19 ENCOUNTER — Ambulatory Visit (INDEPENDENT_AMBULATORY_CARE_PROVIDER_SITE_OTHER): Payer: PPO | Admitting: Internal Medicine

## 2015-03-19 DIAGNOSIS — A319 Mycobacterial infection, unspecified: Secondary | ICD-10-CM | POA: Diagnosis not present

## 2015-03-19 LAB — COMPREHENSIVE METABOLIC PANEL
ALBUMIN: 4.1 g/dL (ref 3.6–5.1)
ALT: 15 U/L (ref 6–29)
AST: 16 U/L (ref 10–35)
Alkaline Phosphatase: 73 U/L (ref 33–130)
BILIRUBIN TOTAL: 0.3 mg/dL (ref 0.2–1.2)
BUN: 18 mg/dL (ref 7–25)
CHLORIDE: 104 mmol/L (ref 98–110)
CO2: 26 mmol/L (ref 20–31)
CREATININE: 1.36 mg/dL — AB (ref 0.50–0.99)
Calcium: 9.6 mg/dL (ref 8.6–10.4)
Glucose, Bld: 69 mg/dL (ref 65–99)
Potassium: 4.4 mmol/L (ref 3.5–5.3)
SODIUM: 141 mmol/L (ref 135–146)
TOTAL PROTEIN: 7.4 g/dL (ref 6.1–8.1)

## 2015-03-19 NOTE — Progress Notes (Signed)
[facility] for Infectious Disease  Patient Active Problem List   Diagnosis Date Noted  . Atypical mycobacterium infection     Priority: High  . ILD (interstitial lung disease) ([place])     Priority: Medium  . Chronic renal insufficiency 09/16/2014  . Pneumothorax on right   . Routine general medical examination at a health care facility 04/03/2014  . Morbid obesity ([place]) 04/03/2014  . OSA (obstructive sleep apnea) 02/25/2014  . Chronic diastolic heart failure ([place]) 02/25/2014  . Controlled steroid-induced diabetes mellitus ([place]) 02/25/2014  . Essential hypertension 02/25/2014    Patient's Medications  New Prescriptions   No medications on file  Previous Medications   ALBUTEROL (PROAIR HFA) 108 (90 BASE) MCG/ACT INHALER    Inhale 2 puffs into the lungs every 6 (six) hours as needed for wheezing or shortness of breath.   AZITHROMYCIN (ZITHROMAX) 500 MG TABLET    Take 1 tablet (500 mg total) by mouth 3 (three) times a week.   BUDESONIDE-FORMOTEROL (SYMBICORT) 160-4.5 MCG/ACT INHALER    Inhale 2 puffs into the lungs 2 (two) times daily.   CLOBETASOL PROP EMOLLIENT BASE (CLOBETASOL PROPIONATE E) 0.05 % EMOLLIENT CREAM    Apply 1 application topically daily as needed (skin irritations).    ETHAMBUTOL (MYAMBUTOL) 400 MG TABLET    Take 6 tablets (2,400 mg total) by mouth 3 (three) times a week. On Monday-Wednesday-Friday   FLUTICASONE (FLONASE) 50 MCG/ACT NASAL SPRAY    Place 1 spray into both nostrils daily.   FUROSEMIDE (LASIX) 40 MG TABLET    TAKE 1 TABLET (40 MG TOTAL) BY MOUTH DAILY.   GABAPENTIN (NEURONTIN) 300 MG CAPSULE    Take 1 capsule (300 mg total) by mouth 2 (two) times daily as needed.   HYDROCODONE-ACETAMINOPHEN (NORCO/VICODIN) 5-325 MG PER TABLET    Take 1-2 tablets by mouth every 4 (four) hours as needed for moderate pain.   LABETALOL (NORMODYNE) 200 MG TABLET    TAKE 1 TABLET BY MOUTH TWICE A DAY   LISINOPRIL (PRINIVIL,ZESTRIL) 20 MG TABLET    Take 1 tablet  (20 mg total) by mouth daily.   MONTELUKAST (SINGULAIR) 10 MG TABLET    TAKE 1 TABLET (10 MG TOTAL) BY MOUTH AT BEDTIME.   POTASSIUM CITRATE (UROCIT-K) 10 MEQ (1080 MG) SR TABLET    Take 1 tablet (10 mEq total) by mouth daily.   RIFAMPIN (RIFADIN) 300 MG CAPSULE    Take 2 capsules (600 mg total) by mouth 3 (three) times a week.   SITAGLIPTIN (JANUVIA) 100 MG TABLET    Take 1 tablet (100 mg total) by mouth daily.  Modified Medications   No medications on file  Discontinued Medications   No medications on file    Subjective: Stacey Page is in for her routine follow-up visit. She continues on azithromycin, ethambutol and rifampin 3 times weekly for her Mycobacterium avium pneumonia. Her dyspnea on exertion is back at her baseline. She has minimal dry cough and overall is feeling better since starting therapy 9 months ago. She's having no problems tolerating her medications. She recently has developed a floater in her vision. She believes it is in her right eye. She saw her ophthalmologist in November but cannot recall if she told him about it at that time. She has not noted any change in her color vision.  Review of Systems: Review of Systems  Constitutional: Negative for fever, chills, weight loss, malaise/fatigue and diaphoresis.  HENT: Negative for  sore throat.   Eyes: Negative for blurred vision and double vision.       She has had an intermittent floater in her visual field.  Respiratory: Positive for cough and shortness of breath. Negative for sputum production and wheezing.   Cardiovascular: Negative for chest pain.  Gastrointestinal: Negative for nausea, vomiting and diarrhea.  Genitourinary: Negative for dysuria and frequency.  Musculoskeletal: Negative for joint pain.  Skin: Negative for rash.  Neurological: Negative for headaches.    Past Medical History  Diagnosis Date  . HTN (hypertension)   . Asthma   . Seasonal allergies   . OA (osteoarthritis)   . Interstitial lung  disease ([place]) 03/26/2014  . OSA (obstructive sleep apnea) 02/25/2014    wears CPAP at night  . Anxiety   . Depression   . Diabetes ([place])     Type 2  . Acute renal failure [facility]) May 19, 2014  . GERD (gastroesophageal reflux disease)   . Swelling of ankle     Bilateral; takes Lasix  . Aortic stenosis, mild     mild AS 01/2014 echo  . Shortness of breath on exertion   . COPD (chronic obstructive pulmonary disease) ([place])     Social History  Substance Use Topics  . Smoking status: Former Smoker -- 0.25 packs/day for 15 years    Types: Cigarettes    Quit date: 02/08/1980  . Smokeless tobacco: None     Comment: 2 packs per week  . Alcohol Use: No     Comment: occassional/social/rare    Family History  Problem Relation Age of Onset  . COPD Father   . Cancer Mother     No Known Allergies  Objective: Filed Vitals:   03/19/15 1001  BP: 182/66  Pulse: 69  Temp: 98.2 F (36.8 C)  TempSrc: Oral  Height: 5\' 8"  (1.727 m)  Weight: 323 lb 8 oz (146.739 kg)  SpO2: 96%   Body mass index is 49.2 kg/(m^2).  Physical Exam  Constitutional: She is oriented to person, place, and time. No distress.  She is in good spirits.  HENT:  Mouth/Throat: No oropharyngeal exudate.  Eyes: Conjunctivae are normal.  Cardiovascular: Normal rate and regular rhythm.   No murmur heard. Pulmonary/Chest: Breath sounds normal.  Abdominal: Soft. There is no tenderness.  Neurological: She is alert and oriented to person, place, and time.  Skin: No rash noted.  Psychiatric: Mood and affect normal.    Lab Results    Problem List Items Addressed This Visit      High   Atypical mycobacterium infection    She has improved on therapy for Mycobacterium avium pneumonia. She will complete therapy in 3 months.      Relevant Orders   Comprehensive metabolic panel       Michel Bickers, MD [facility] for [place] [phone] pager   [phone] cell 03/19/2015,  10:27 AM

## 2015-03-19 NOTE — Assessment & Plan Note (Signed)
She has improved on therapy for Mycobacterium avium pneumonia. She will complete therapy in 3 months.

## 2015-03-23 DIAGNOSIS — A319 Mycobacterial infection, unspecified: Secondary | ICD-10-CM | POA: Diagnosis not present

## 2015-03-26 ENCOUNTER — Ambulatory Visit (INDEPENDENT_AMBULATORY_CARE_PROVIDER_SITE_OTHER): Payer: PPO | Admitting: Pulmonary Disease

## 2015-03-26 ENCOUNTER — Encounter: Payer: Self-pay | Admitting: Pulmonary Disease

## 2015-03-26 VITALS — BP 102/70 | HR 64 | Ht 68.0 in | Wt 323.0 lb

## 2015-03-26 DIAGNOSIS — A310 Pulmonary mycobacterial infection: Secondary | ICD-10-CM | POA: Diagnosis not present

## 2015-03-26 DIAGNOSIS — G4733 Obstructive sleep apnea (adult) (pediatric): Secondary | ICD-10-CM | POA: Diagnosis not present

## 2015-03-26 DIAGNOSIS — J4530 Mild persistent asthma, uncomplicated: Secondary | ICD-10-CM | POA: Diagnosis not present

## 2015-03-26 MED ORDER — FLUTICASONE PROPIONATE 50 MCG/ACT NA SUSP: 1.0000 | Freq: Every day | NASAL | Status: DC

## 2015-03-26 NOTE — Progress Notes (Signed)
Current Outpatient Prescriptions on File Prior to Visit  Medication Sig  . albuterol (PROAIR HFA) 108 (90 BASE) MCG/ACT inhaler Inhale 2 puffs into the lungs every 6 (six) hours as needed for wheezing or shortness of breath.  Marland Kitchen azithromycin (ZITHROMAX) 500 MG tablet Take 1 tablet (500 mg total) by mouth 3 (three) times a week.  . budesonide-formoterol (SYMBICORT) 160-4.5 MCG/ACT inhaler Inhale 2 puffs into the lungs 2 (two) times daily.  . Clobetasol Prop Emollient Base (CLOBETASOL PROPIONATE E) 0.05 % emollient cream Apply 1 application topically daily as needed (skin irritations).   Marland Kitchen ethambutol (MYAMBUTOL) 400 MG tablet Take 6 tablets (2,400 mg total) by mouth 3 (three) times a week. On Monday-Wednesday-Friday  . furosemide (LASIX) 40 MG tablet TAKE 1 TABLET (40 MG TOTAL) BY MOUTH DAILY.  Marland Kitchen gabapentin (NEURONTIN) 300 MG capsule Take 1 capsule (300 mg total) by mouth 2 (two) times daily as needed.  Marland Kitchen HYDROcodone-acetaminophen (NORCO/VICODIN) 5-325 MG per tablet Take 1-2 tablets by mouth every 4 (four) hours as needed for moderate pain.  Marland Kitchen labetalol (NORMODYNE) 200 MG tablet TAKE 1 TABLET BY MOUTH TWICE A DAY  . lisinopril (PRINIVIL,ZESTRIL) 20 MG tablet Take 1 tablet (20 mg total) by mouth daily.  . montelukast (SINGULAIR) 10 MG tablet TAKE 1 TABLET (10 MG TOTAL) BY MOUTH AT BEDTIME.  Marland Kitchen potassium citrate (UROCIT-K) 10 MEQ (1080 MG) SR tablet Take 1 tablet (10 mEq total) by mouth daily.  . rifampin (RIFADIN) 300 MG capsule Take 2 capsules (600 mg total) by mouth 3 (three) times a week.  . sitaGLIPtin (JANUVIA) 100 MG tablet Take 1 tablet (100 mg total) by mouth daily.   No current facility-administered medications on file prior to visit.     Chief Complaint  Patient presents with  . Follow-up    Wears CPAP nightly. Pt would like to dicuss changing DME. Current DME: [facility]     Tests Echo 01/28/14 >> severe LVH, EF 65 to [id number], grade 2 diastolic dysfx, mild AS, mild/mod LA. HST 02/12/14 >> AHI  59.3, SaO2 low 67% PFT 03/12/14 >> FEV1 2.05 (88%), FEV1% 87, TLC 4.14 (75%), DLCO 52%, no BD HRCT chest 03/20/14 >> multiple small nodules, patchy GGO, mild BTX, air trapping VATS lung bx 06/10/14 >> bronchiolitis with non necrotizing granuloma and AFB Auto CPAP 02/24/15 to 03/25/15 >> used on 29 of 30 nights with average 7 hrs 11 min.  Average AHI 3.5 with median CPAP 10 cm H2O and 95 th percentile CPAP 12 cm H2O  Past medical hx Diastolic CHF, HTN, Diastolic CHF, DM type II  Past surgical hx, Allergies, Family hx, Social hx all reviewed.  Vital Signs BP 102/70 mmHg  Pulse 64  Ht 5\' 8"  (1.727 m)  Wt 323 lb (146.512 kg)  BMI 49.12 kg/m2  SpO2 97%  History of Present Illness Stacey Page is a 65 y.o. female former smoker with MAI, asthma, and OSA.  She was seen by ID last week >> 3 more months of tx for MAI.  She is not having cough, wheeze, sputum, hemoptysis, or chest pain.  She denies fever, sweats.  She is not using symbicort or albuterol on a regular basis.  She still gets winded with activity and has trouble walking more than 200 ft w/o having to stop to catch her breath.  She is using CPAP w/o difficulty.  Physical Exam  General - No distress ENT - No sinus tenderness, no oral exudate, no LAN Cardiac - s1s2 regular, no murmur  Chest - No wheeze/rales/dullness Back - No focal tenderness Abd - Soft, non-tender Ext - No edema Neuro - Normal strength Skin - No rashes Psych - normal mood, and behavior   Assessment/Plan  Dyspnea. Plan: - handicap parking form completed  MAI. Plan: - she will continue tx with ID through May 2017  Asthma. Plan: - continue singulair - she uses symbicort, albuterol when her symptoms get worse >> usual in Spring  Allergic rhinitis. Plan: - refilled flonase  OSA. Plan: - continue auto CPAP  Obesity with deconditioning. Plan: - encouraged her to maintain a regular exercise regimen   Patient Instructions  Follow up in  4 months     Chesley Mires, MD [facility] Pager:  [phone]

## 2015-03-26 NOTE — Patient Instructions (Signed)
Follow up in 4 months 

## 2015-03-27 ENCOUNTER — Other Ambulatory Visit: Payer: Self-pay | Admitting: Internal Medicine

## 2015-04-01 DIAGNOSIS — G4733 Obstructive sleep apnea (adult) (pediatric): Secondary | ICD-10-CM | POA: Diagnosis not present

## 2015-04-01 DIAGNOSIS — R0600 Dyspnea, unspecified: Secondary | ICD-10-CM | POA: Diagnosis not present

## 2015-04-01 DIAGNOSIS — R0683 Snoring: Secondary | ICD-10-CM | POA: Diagnosis not present

## 2015-04-04 ENCOUNTER — Other Ambulatory Visit: Payer: Self-pay | Admitting: Internal Medicine

## 2015-04-08 ENCOUNTER — Other Ambulatory Visit: Payer: Self-pay

## 2015-04-08 NOTE — Patient Outreach (Signed)
[facility]) Care Management  04/08/2015  Otila Manganiello 1950/06/03 [id number]   Telephone call for scheduled assessment.  Spoke with patient, but she reports she is currently in [place] visiting family, but will return in about a month.  RN will follow up in approximately 6 weeks after patient returns to [place].  Candie Mile, RN, MSN [place] [phone] Fax [phone]

## 2015-04-26 ENCOUNTER — Other Ambulatory Visit: Payer: Self-pay | Admitting: Internal Medicine

## 2015-05-01 DIAGNOSIS — I110 Hypertensive heart disease with heart failure: Secondary | ICD-10-CM | POA: Diagnosis not present

## 2015-05-01 DIAGNOSIS — M25561 Pain in right knee: Secondary | ICD-10-CM | POA: Diagnosis not present

## 2015-05-01 DIAGNOSIS — I509 Heart failure, unspecified: Secondary | ICD-10-CM | POA: Diagnosis not present

## 2015-05-01 DIAGNOSIS — E119 Type 2 diabetes mellitus without complications: Secondary | ICD-10-CM | POA: Diagnosis not present

## 2015-05-01 DIAGNOSIS — M179 Osteoarthritis of knee, unspecified: Secondary | ICD-10-CM | POA: Diagnosis not present

## 2015-05-01 DIAGNOSIS — M1711 Unilateral primary osteoarthritis, right knee: Secondary | ICD-10-CM | POA: Diagnosis not present

## 2015-05-07 DIAGNOSIS — J449 Chronic obstructive pulmonary disease, unspecified: Secondary | ICD-10-CM | POA: Diagnosis not present

## 2015-05-07 DIAGNOSIS — M25461 Effusion, right knee: Secondary | ICD-10-CM | POA: Diagnosis not present

## 2015-05-07 DIAGNOSIS — M1711 Unilateral primary osteoarthritis, right knee: Secondary | ICD-10-CM | POA: Diagnosis not present

## 2015-05-07 DIAGNOSIS — E119 Type 2 diabetes mellitus without complications: Secondary | ICD-10-CM | POA: Diagnosis not present

## 2015-05-07 DIAGNOSIS — G4733 Obstructive sleep apnea (adult) (pediatric): Secondary | ICD-10-CM | POA: Diagnosis not present

## 2015-05-07 DIAGNOSIS — M25761 Osteophyte, right knee: Secondary | ICD-10-CM | POA: Diagnosis not present

## 2015-05-07 DIAGNOSIS — M25561 Pain in right knee: Secondary | ICD-10-CM | POA: Diagnosis not present

## 2015-05-07 DIAGNOSIS — Z8611 Personal history of tuberculosis: Secondary | ICD-10-CM | POA: Diagnosis not present

## 2015-05-07 DIAGNOSIS — Z87891 Personal history of nicotine dependence: Secondary | ICD-10-CM | POA: Diagnosis not present

## 2015-05-07 DIAGNOSIS — M79661 Pain in right lower leg: Secondary | ICD-10-CM | POA: Diagnosis not present

## 2015-05-07 DIAGNOSIS — I10 Essential (primary) hypertension: Secondary | ICD-10-CM | POA: Diagnosis not present

## 2015-05-07 DIAGNOSIS — Z7984 Long term (current) use of oral hypoglycemic drugs: Secondary | ICD-10-CM | POA: Diagnosis not present

## 2015-05-13 ENCOUNTER — Other Ambulatory Visit: Payer: Self-pay

## 2015-05-13 DIAGNOSIS — J449 Chronic obstructive pulmonary disease, unspecified: Secondary | ICD-10-CM

## 2015-05-13 NOTE — Patient Outreach (Signed)
[facility]) Care Management  05/13/2015  Stacey Page 1950-06-21 [id number]   Telephone contact with patient.  She states she returned on Monday (05-11-15) after being in [place] with family for 6 weeks.  She reports her respiratory status is unchanged.  States she has knee pain which led to two ED visits while she was in [place].  States she was told she needs knee replacement surgery.  Patient states she has appt. on 4-19 with orthopedist, but she is hoping to get in earlier.  States she is taking Naproxin and Tylenol for pain, but plans to call PCP Dr. Wayne Sever to see if she can see her and order something stronger for managing her knee pain until she sees the orthopedist.  Plan:  Patient will let me know if surgery is scheduled.           RN will follow up in approximately one month.  Candie Mile, RN, MSN [place] [phone] Fax [phone]

## 2015-05-14 ENCOUNTER — Ambulatory Visit (INDEPENDENT_AMBULATORY_CARE_PROVIDER_SITE_OTHER): Payer: PPO | Admitting: Family

## 2015-05-14 ENCOUNTER — Encounter: Payer: Self-pay | Admitting: Family

## 2015-05-14 DIAGNOSIS — M25561 Pain in right knee: Secondary | ICD-10-CM | POA: Diagnosis not present

## 2015-05-14 MED ORDER — NAPROXEN 500 MG PO TABS: 500.0000 mg | ORAL_TABLET | Freq: Two times a day (BID) | ORAL | Status: DC

## 2015-05-14 MED ORDER — HYDROCODONE-ACETAMINOPHEN 5-325 MG PO TABS: 1.0000 | ORAL_TABLET | Freq: Three times a day (TID) | ORAL | Status: DC | PRN

## 2015-05-14 NOTE — Progress Notes (Signed)
Subjective:    Patient ID: Stacey Page, female    DOB: 1950-11-02, 65 y.o.   MRN: [id number]  Chief Complaint  Patient presents with  . Leg Pain    has alot of pain in her right leg, would like MRI and something for pain    HPI:  Stacey Page is a 65 y.o. female who  has a past medical history of HTN (hypertension); Asthma; Seasonal allergies; OA (osteoarthritis); Interstitial lung disease ([place]) (03/26/2014); OSA (obstructive sleep apnea) (02/25/2014); Anxiety; Depression; Diabetes ([place]); Acute renal failure [facility]) (May 19, 2014); GERD (gastroesophageal reflux disease); Swelling of ankle; Aortic stenosis, mild; Shortness of breath on exertion; and COPD (chronic obstructive pulmonary disease) ([place]). and presents today for an office follow up.  This is a new problem. Associated symptom of pain located in her knee has been going on for several years and has progressively worsened . She was seen in the ED in [place] and she was given Tramadol and Acetaminophen which did not help very much. She returned to the ED a second time and was told that her right knee was bone-on-bone. Describes the pain as constant knee pain. Modifying factors include Naproxen and Tylenol. She is requesting an MRI and pain medication.   No Known Allergies   Current Outpatient Prescriptions on File Prior to Visit  Medication Sig Dispense Refill  . albuterol (PROAIR HFA) 108 (90 BASE) MCG/ACT inhaler Inhale 2 puffs into the lungs every 6 (six) hours as needed for wheezing or shortness of breath. 1 Inhaler 3  . azithromycin (ZITHROMAX) 500 MG tablet Take 1 tablet (500 mg total) by mouth 3 (three) times a week. 15 tablet 6  . budesonide-formoterol (SYMBICORT) 160-4.5 MCG/ACT inhaler Inhale 2 puffs into the lungs 2 (two) times daily.    . Clobetasol Prop Emollient Base (CLOBETASOL PROPIONATE E) 0.05 % emollient cream Apply 1 application topically daily as needed (skin irritations).     Marland Kitchen ethambutol (MYAMBUTOL) 400 MG  tablet Take 6 tablets (2,400 mg total) by mouth 3 (three) times a week. On Monday-Wednesday-Friday 30 tablet 6  . fluticasone (FLONASE) 50 MCG/ACT nasal spray Place 1 spray into both nostrils daily. 16 g 3  . furosemide (LASIX) 40 MG tablet TAKE 1 TABLET (40 MG TOTAL) BY MOUTH DAILY. 90 tablet 3  . gabapentin (NEURONTIN) 300 MG capsule Take 1 capsule (300 mg total) by mouth 2 (two) times daily as needed. 60 capsule 1  . labetalol (NORMODYNE) 200 MG tablet TAKE 1 TABLET BY MOUTH TWICE A DAY 180 tablet 1  . lisinopril (PRINIVIL,ZESTRIL) 20 MG tablet Take 1 tablet (20 mg total) by mouth daily. 90 tablet 3  . montelukast (SINGULAIR) 10 MG tablet TAKE 1 TABLET (10 MG TOTAL) BY MOUTH AT BEDTIME. 30 tablet 1  . potassium citrate (UROCIT-K) 10 MEQ (1080 MG) SR tablet TAKE 1 TABLET BY MOUTH EVERY DAY 90 tablet 2  . rifampin (RIFADIN) 300 MG capsule Take 2 capsules (600 mg total) by mouth 3 (three) times a week. 30 capsule 6  . sitaGLIPtin (JANUVIA) 100 MG tablet Take 1 tablet (100 mg total) by mouth daily. 30 tablet 3   No current facility-administered medications on file prior to visit.     Past Surgical History  Procedure Laterality Date  . Cesarean section  x2  . Polypectomy      throat  . Knee arthroscopy Right   . Video assisted thoracoscopy Right 06/10/2014    Procedure: VIDEO ASSISTED THORACOSCOPY;  Surgeon: Tharon Aquas Trigt,  MD;  Location: [facility];  Service: Thoracic;  Laterality: Right;  . Lung biopsy Right 06/10/2014    Procedure: LUNG BIOPSY;  Surgeon: Ivin Poot, MD;  Location: [facility] OR;  Service: Thoracic;  Laterality: Right;      Review of Systems  Constitutional: Negative for fever and chills.  Musculoskeletal:       Positive for right knee pain.  Neurological: Negative for weakness and numbness.      Objective:    BP 140/84 mmHg  Pulse 70  Temp(Src) 98.3 F (36.8 C) (Oral)  Resp 22  Ht 5\' 8"  (1.727 m)  Wt   SpO2 95% Nursing note and vital signs reviewed.  Physical  Exam  Constitutional: She is oriented to person, place, and time. She appears well-developed and well-nourished. No distress.  Leaning against the wall. Dressed appropriately. Answers questions appropriately.  Cardiovascular: Normal rate, regular rhythm, normal heart sounds and intact distal pulses.   Pulmonary/Chest: Effort normal and breath sounds normal.  Musculoskeletal:  Right knee- Moderate edema with no discoloration or deformity noted. Generalized tenderness of anterior, medial and lateral knee structures. Knee range of motion decreased secondary to discomfort. Ligamentous and meniscal testing deferred.   Neurological: She is alert and oriented to person, place, and time.  Skin: Skin is warm and dry.  Psychiatric: She has a normal mood and affect. Her behavior is normal. Judgment and thought content normal.       Assessment & Plan:   Problem List Items Addressed This Visit      Other   Right knee pain    Right knee pain consistent with severe osteoarthritis. Imaging not available but has been requested. Refill naproxen and Norco as needed for inflammation and pain. Kidney function appears stable presently. Ice and home exercise therapy. Declines cortisone injection. Obtain MRI to rule out underlying pathology. Follow up with orthopedics as scheduled.       Relevant Medications   naproxen (NAPROSYN) 500 MG tablet   HYDROcodone-acetaminophen (NORCO/VICODIN) 5-325 MG tablet   Other Relevant Orders   MR Knee Right Wo Contrast       I have changed [name]'s HYDROcodone-acetaminophen. I am also having her start on naproxen. Additionally, I am having her maintain her Clobetasol Prop Emollient Base, budesonide-formoterol, albuterol, gabapentin, azithromycin, ethambutol, rifampin, furosemide, sitaGLIPtin, lisinopril, fluticasone, montelukast, labetalol, and potassium citrate.   Meds ordered this encounter  Medications  . naproxen (NAPROSYN) 500 MG tablet    Sig: Take 1 tablet  (500 mg total) by mouth 2 (two) times daily with a meal.    Dispense:  60 tablet    Refill:  0    Order Specific Question:  Supervising Provider    Answer:  Pricilla Holm A [id number]  . HYDROcodone-acetaminophen (NORCO/VICODIN) 5-325 MG tablet    Sig: Take 1 tablet by mouth every 8 (eight) hours as needed for moderate pain.    Dispense:  30 tablet    Refill:  0    Order Specific Question:  Supervising Provider    Answer:  Pricilla Holm A [id number]     Follow-up: Return for Follow up with orthopedics.  Mauricio Po, FNP

## 2015-05-14 NOTE — Progress Notes (Signed)
Pre visit review using our clinic review tool, if applicable. No additional management support is needed unless otherwise documented below in the visit note. 

## 2015-05-14 NOTE — Assessment & Plan Note (Signed)
Right knee pain consistent with severe osteoarthritis. Imaging not available but has been requested. Refill naproxen and Norco as needed for inflammation and pain. Kidney function appears stable presently. Ice and home exercise therapy. Declines cortisone injection. Obtain MRI to rule out underlying pathology. Follow up with orthopedics as scheduled.

## 2015-05-14 NOTE — Patient Instructions (Signed)
Thank you for choosing [facility].  Summary/Instructions:  Ice 4-5 times per day and as needed for 20 minutes at a time.   Naproxen 2x daily.  Use Vicodin sparingly.  Your prescription(s) have been submitted to your pharmacy or been printed and provided for you. Please take as directed and contact our office if you believe you are having problem(s) with the medication(s) or have any questions.  Referrals have been made during this visit. You should expect to hear back from our schedulers in about 7-10 days in regards to establishing an appointment with the specialists we discussed.   If your symptoms worsen or fail to improve, please contact our office for further instruction, or in case of emergency go directly to the emergency room at the closest medical facility.

## 2015-05-23 ENCOUNTER — Ambulatory Visit
Admission: RE | Admit: 2015-05-23 | Discharge: 2015-05-23 | Disposition: A | Discharge status: Home / Self Care | Payer: PPO | Source: Ambulatory Visit | Attending: Family | Admitting: Family

## 2015-05-23 DIAGNOSIS — M25561 Pain in right knee: Secondary | ICD-10-CM

## 2015-05-27 DIAGNOSIS — M25561 Pain in right knee: Secondary | ICD-10-CM | POA: Diagnosis not present

## 2015-06-06 ENCOUNTER — Other Ambulatory Visit: Payer: Self-pay | Admitting: Internal Medicine

## 2015-06-09 ENCOUNTER — Ambulatory Visit (INDEPENDENT_AMBULATORY_CARE_PROVIDER_SITE_OTHER): Payer: PPO | Admitting: Internal Medicine

## 2015-06-09 ENCOUNTER — Other Ambulatory Visit (INDEPENDENT_AMBULATORY_CARE_PROVIDER_SITE_OTHER): Payer: PPO

## 2015-06-09 ENCOUNTER — Encounter: Payer: Self-pay | Admitting: Internal Medicine

## 2015-06-09 VITALS — BP 130/80 | HR 66 | Temp 97.5°F | Ht 68.0 in | Wt 307.0 lb

## 2015-06-09 DIAGNOSIS — F410 Panic disorder [episodic paroxysmal anxiety] without agoraphobia: Secondary | ICD-10-CM

## 2015-06-09 DIAGNOSIS — E119 Type 2 diabetes mellitus without complications: Secondary | ICD-10-CM | POA: Diagnosis not present

## 2015-06-09 DIAGNOSIS — M25561 Pain in right knee: Secondary | ICD-10-CM

## 2015-06-09 DIAGNOSIS — T380X1A Poisoning by glucocorticoids and synthetic analogues, accidental (unintentional), initial encounter: Secondary | ICD-10-CM | POA: Diagnosis not present

## 2015-06-09 DIAGNOSIS — A319 Mycobacterial infection, unspecified: Secondary | ICD-10-CM | POA: Diagnosis not present

## 2015-06-09 DIAGNOSIS — E099 Drug or chemical induced diabetes mellitus without complications: Secondary | ICD-10-CM

## 2015-06-09 DIAGNOSIS — T380X5A Adverse effect of glucocorticoids and synthetic analogues, initial encounter: Secondary | ICD-10-CM

## 2015-06-09 LAB — COMPREHENSIVE METABOLIC PANEL
ALT: 26 U/L (ref 0–35)
AST: 17 U/L (ref 0–37)
Albumin: 4.2 g/dL (ref 3.5–5.2)
Alkaline Phosphatase: 74 U/L (ref 39–117)
BILIRUBIN TOTAL: 0.5 mg/dL (ref 0.2–1.2)
BUN: 28 mg/dL — ABNORMAL HIGH (ref 6–23)
CALCIUM: 10.7 mg/dL — AB (ref 8.4–10.5)
CHLORIDE: 99 meq/L (ref 96–112)
CO2: 27 meq/L (ref 19–32)
Creatinine, Ser: 1.34 mg/dL — ABNORMAL HIGH (ref 0.40–1.20)
GFR: 51.29 mL/min — AB (ref >60.00)
Glucose, Bld: 103 mg/dL — ABNORMAL HIGH (ref 70–99)
Potassium: 4.3 mEq/L (ref 3.5–5.1)
Sodium: 136 mEq/L (ref 135–145)
Total Protein: 8.4 g/dL — ABNORMAL HIGH (ref 6.0–8.3)

## 2015-06-09 LAB — HEMOGLOBIN A1C: Hgb A1c MFr Bld: 6 % (ref 4.6–6.5)

## 2015-06-09 LAB — CBC
HCT: 35 % — ABNORMAL LOW (ref 36.0–46.0)
Hemoglobin: 11.8 g/dL — ABNORMAL LOW (ref 12.0–15.0)
MCHC: 33.7 g/dL (ref 30.0–36.0)
MCV: 86.4 fl (ref 78.0–100.0)
PLATELETS: 290 10*3/uL (ref 150.0–400.0)
RBC: 4.05 Mil/uL (ref 3.87–5.11)
RDW: 13.9 % (ref 11.5–15.5)
WBC: 5.6 10*3/uL (ref 4.0–10.5)

## 2015-06-09 MED ORDER — ALPRAZOLAM 0.25 MG PO TABS: 0.2500 mg | ORAL_TABLET | Freq: Two times a day (BID) | ORAL | Status: DC | PRN

## 2015-06-09 MED ORDER — HYDROCODONE-ACETAMINOPHEN 7.5-325 MG PO TABS: 1.0000 | ORAL_TABLET | Freq: Two times a day (BID) | ORAL | Status: DC | PRN

## 2015-06-09 NOTE — Assessment & Plan Note (Signed)
Talked to them about relaxation and stress management. They are insistent to try xanax. Rx given but they are not happy with the amount and request increased number which is declined. They are advised to try this and see if it is effective. If this is a more persistent problem may need medication for management and not just prn therapy.

## 2015-06-09 NOTE — Assessment & Plan Note (Signed)
Rx for hydrocodone 7.5/325 #60 no refills at visit. Needs visit in 3 months and call sooner if not controlled. Seeing orthopedics and they will not operate without some weight loss. Not taking naproxen. Given steroid injection by orthopedics which she states did not help. Encouraged her to try water aerobics to help with weight loss and joint pain.

## 2015-06-09 NOTE — Progress Notes (Signed)
   Subjective:    Patient ID: Stacey Page, female    DOB: 07-19-1950, 65 y.o.   MRN: [id number]  HPI The patient is a 65 YO female coming in for several concerns including her recent right knee pain (given naproxen and hydrocodone and it was not effective, she thinks she needs higher dose hydrocodone given her weight, went to orthopedics and they told her severe OA and needs to lose weight for surgery, given steroid injection which helped for a couple days only), and her new panic attacks (with all this recent pain and her knees she is getting them a couple times per week, not sleeping well, QOL is poor at this time, wants to try xanax to see if this helps), and her diabetes (she is still taking januvia, no complications, no side effects, weight is down some). Needs some DME.   Review of Systems  Constitutional: Positive for activity change. Negative for fever, chills, appetite change, fatigue and unexpected weight change.  Respiratory: Negative for cough, chest tightness, shortness of breath and wheezing.        Improving  Cardiovascular: Negative for chest pain, palpitations and leg swelling.  Gastrointestinal: Negative for nausea, abdominal pain, diarrhea, constipation and abdominal distention.  Musculoskeletal: Positive for arthralgias and gait problem.  Skin: Negative.   Neurological: Negative.   Psychiatric/Behavioral: Positive for sleep disturbance, dysphoric mood and decreased concentration. Negative for suicidal ideas, behavioral problems and self-injury. The patient is nervous/anxious.       Objective:   Physical Exam  Constitutional: She is oriented to person, place, and time. She appears well-developed and well-nourished.  Overweight  HENT:  Head: Normocephalic and atraumatic.  Eyes: EOM are normal.  Cardiovascular: Normal rate and regular rhythm.   Pulmonary/Chest: Effort normal and breath sounds normal. No respiratory distress.  Some mild crackles, not new and slightly  improved from prior  Abdominal: Soft. Bowel sounds are normal. She exhibits no distension. There is no tenderness. There is no rebound.  Musculoskeletal: She exhibits no edema.  Neurological: She is alert and oriented to person, place, and time. Coordination normal.  Skin: Skin is warm and dry.  Psychiatric: She has a normal mood and affect.   Filed Vitals:   06/09/15 1047  BP: 130/80  Pulse: 66  Temp: 97.5 F (36.4 C)  TempSrc: Oral  Height: 5\' 8"  (1.727 m)  Weight: 307 lb (139.254 kg)  SpO2: 96%      Assessment & Plan:

## 2015-06-09 NOTE — Patient Instructions (Addendum)
Toilet seat prescription given to you today that you can take to get filled.   We are checking the labs today and will call back with the results.  We have given you the hydrocodone and xanax today to try.   Think about trying water aerobics as it is exercise that is easy on the joints and can be done to help with weight loss. Some people also get some relief from the warm water on their joints.   We have done the nutrition referral.

## 2015-06-09 NOTE — Assessment & Plan Note (Signed)
Still taking her meds and due for follow up next week to evaluate end of treatment.

## 2015-06-09 NOTE — Progress Notes (Signed)
Pre visit review using our clinic review tool, if applicable. No additional management support is needed unless otherwise documented below in the visit note. 

## 2015-06-09 NOTE — Assessment & Plan Note (Signed)
Doing well on januvia 100 mg daily, on ACE-I. Checking HgA1c today and if weight loss continues may be able to stop [place]. Adjust as needed.

## 2015-06-11 ENCOUNTER — Telehealth: Payer: Self-pay | Admitting: *Deleted

## 2015-06-11 NOTE — Telephone Encounter (Signed)
Left msg on triage requesting to speak w/[name] saw MD on Tues...Stacey Page

## 2015-06-11 NOTE — Telephone Encounter (Signed)
Patient will bring back the printed rx because it needs to be faxed to [facility].

## 2015-06-16 ENCOUNTER — Ambulatory Visit (INDEPENDENT_AMBULATORY_CARE_PROVIDER_SITE_OTHER): Payer: PPO | Admitting: Internal Medicine

## 2015-06-16 ENCOUNTER — Ambulatory Visit
Admission: RE | Admit: 2015-06-16 | Discharge: 2015-06-16 | Disposition: A | Discharge status: Home / Self Care | Payer: PPO | Source: Ambulatory Visit | Attending: Internal Medicine | Admitting: Internal Medicine

## 2015-06-16 ENCOUNTER — Encounter: Payer: Self-pay | Admitting: Internal Medicine

## 2015-06-16 DIAGNOSIS — R918 Other nonspecific abnormal finding of lung field: Secondary | ICD-10-CM | POA: Diagnosis not present

## 2015-06-16 DIAGNOSIS — A319 Mycobacterial infection, unspecified: Secondary | ICD-10-CM

## 2015-06-16 NOTE — Progress Notes (Signed)
[facility]  Patient Active Problem List   Diagnosis Date Noted  . Atypical mycobacterium infection     Priority: High  . ILD (interstitial lung disease) ([place])     Priority: Medium  . Panic attack 06/09/2015  . Right knee pain 05/14/2015  . Chronic renal insufficiency 09/16/2014  . Pneumothorax on right   . Routine general medical examination at a health care facility 04/03/2014  . Morbid obesity ([place]) 04/03/2014  . OSA (obstructive sleep apnea) 02/25/2014  . Chronic diastolic heart failure ([place]) 02/25/2014  . Controlled steroid-induced diabetes mellitus ([place]) 02/25/2014  . Essential hypertension 02/25/2014    Patient's Medications  New Prescriptions   No medications on file  Previous Medications   ALBUTEROL (PROAIR HFA) 108 (90 BASE) MCG/ACT INHALER    Inhale 2 puffs into the lungs every 6 (six) hours as needed for wheezing or shortness of breath.   ALPRAZOLAM (XANAX) 0.25 MG TABLET    Take 1 tablet (0.25 mg total) by mouth 2 (two) times daily as needed for anxiety.   BUDESONIDE-FORMOTEROL (SYMBICORT) 160-4.5 MCG/ACT INHALER    Inhale 2 puffs into the lungs 2 (two) times daily.   CLOBETASOL PROP EMOLLIENT BASE (CLOBETASOL PROPIONATE E) 0.05 % EMOLLIENT CREAM    Apply 1 application topically daily as needed (skin irritations).    FLUTICASONE (FLONASE) 50 MCG/ACT NASAL SPRAY    Place 1 spray into both nostrils daily.   FUROSEMIDE (LASIX) 40 MG TABLET    TAKE 1 TABLET (40 MG TOTAL) BY MOUTH DAILY.   GABAPENTIN (NEURONTIN) 300 MG CAPSULE    Take 1 capsule (300 mg total) by mouth 2 (two) times daily as needed.   HYDROCODONE-ACETAMINOPHEN (NORCO) 7.5-325 MG TABLET    Take 1 tablet by mouth 2 (two) times daily as needed for moderate pain.   LABETALOL (NORMODYNE) 200 MG TABLET    TAKE 1 TABLET BY MOUTH TWICE A DAY   LISINOPRIL (PRINIVIL,ZESTRIL) 20 MG TABLET    Take 1 tablet (20 mg total) by mouth daily.   MONTELUKAST (SINGULAIR) 10 MG TABLET     TAKE 1 TABLET (10 MG TOTAL) BY MOUTH AT BEDTIME.   NAPROXEN (NAPROSYN) 500 MG TABLET    Take 1 tablet (500 mg total) by mouth 2 (two) times daily with a meal.   POTASSIUM CITRATE (UROCIT-K) 10 MEQ (1080 MG) SR TABLET    TAKE 1 TABLET BY MOUTH EVERY DAY   SITAGLIPTIN (JANUVIA) 100 MG TABLET    Take 1 tablet (100 mg total) by mouth daily.  Modified Medications   No medications on file  Discontinued Medications   AZITHROMYCIN (ZITHROMAX) 500 MG TABLET    Take 1 tablet (500 mg total) by mouth 3 (three) times a week.   ETHAMBUTOL (MYAMBUTOL) 400 MG TABLET    Take 6 tablets (2,400 mg total) by mouth 3 (three) times a week. On Monday-Wednesday-Friday   RIFAMPIN (RIFADIN) 300 MG CAPSULE    Take 2 capsules (600 mg total) by mouth 3 (three) times a week.    Subjective: Stacey Page is in for her routine follow-up visit. 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 She underwent right VATS lung surgery a little over one year ago. Pathology revealed necrotizing granulomas and acid fast bacilli. No cultures were obtained. PCR was negative for mycobacteria but she was started on empiric therapy for presumed Mycobacterium avium. She  had improvement in her chronic cough. She is feeling much better and has had no problems tolerating her antibiotics. Her primary problems are chronic knee pain and bilateral shoulder pain. She states that she needs bilateral knee replacements and that she has been told that she has rotator cuff injuries.  Review of Systems: Review of Systems  Constitutional: Negative for fever, chills, weight loss, malaise/fatigue and diaphoresis.  HENT: Negative for sore throat.   Respiratory: Negative for cough, sputum production and shortness of breath.   Cardiovascular: Negative for chest pain.  Gastrointestinal: Negative for nausea, vomiting and diarrhea.  Genitourinary: Negative for dysuria and  frequency.  Musculoskeletal: Positive for joint pain. Negative for myalgias.  Skin: Negative for rash.  Neurological: Negative for dizziness and headaches.  Psychiatric/Behavioral: Negative for depression and substance abuse. The patient is not nervous/anxious.     Past Medical History  Diagnosis Date  . HTN (hypertension)   . Asthma   . Seasonal allergies   . OA (osteoarthritis)   . Interstitial lung disease ([place]) 03/26/2014  . OSA (obstructive sleep apnea) 02/25/2014    wears CPAP at night  . Anxiety   . Depression   . Diabetes ([place])     Type 2  . Acute renal failure [facility]) May 19, 2014  . GERD (gastroesophageal reflux disease)   . Swelling of ankle     Bilateral; takes Lasix  . Aortic stenosis, mild     mild AS 01/2014 echo  . Shortness of breath on exertion   . COPD (chronic obstructive pulmonary disease) ([place])     Social History  Substance Use Topics  . Smoking status: Former Smoker -- 0.25 packs/day for 15 years    Types: Cigarettes    Quit date: 02/08/1980  . Smokeless tobacco: None     Comment: 2 packs per week  . Alcohol Use: No     Comment: occassional/social/rare    Family History  Problem Relation Age of Onset  . COPD Father   . Cancer Mother     No Known Allergies  Objective: Filed Vitals:   06/16/15 0902  BP: 140/80  Pulse: 88  Temp: 98.4 F (36.9 C)  TempSrc: Oral   There is no weight on file to calculate BMI.  Physical Exam  Constitutional: She is oriented to person, place, and time.  She is very pleasant and in good spirits as usual. She is seated in a wheelchair. She walks with the assistance of a cane.  HENT:  Mouth/Throat: No oropharyngeal exudate.  Eyes: Conjunctivae are normal.  Cardiovascular: Normal rate and regular rhythm.   No murmur heard. Pulmonary/Chest: Breath sounds normal. She has no wheezes. She has no rales.  Abdominal: Soft. She exhibits no mass. There is no tenderness.  Musculoskeletal: Normal range of motion.  She exhibits no edema.  Some point tenderness over both shoulders anteriorly. No redness or swelling noted.  Neurological: She is alert and oriented to person, place, and time.  Skin: No rash noted.  Psychiatric: Mood and affect normal.    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 I will stop antibiotic therapy now and repeat her chest x-ray. She will follow-up with me in 2 months      Relevant Orders   DG Chest 2 View  Michel Bickers, MD [facility] [place] [facility] [phone] pager   [phone] cell 06/16/2015, 9:32 AM

## 2015-06-16 NOTE — Assessment & Plan Note (Signed)
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 I will stop antibiotic therapy now and repeat her chest x-ray. She will follow-up with me in 2 months

## 2015-06-17 ENCOUNTER — Other Ambulatory Visit: Payer: Self-pay

## 2015-06-17 VITALS — Ht 68.0 in | Wt 307.0 lb

## 2015-06-17 DIAGNOSIS — J449 Chronic obstructive pulmonary disease, unspecified: Secondary | ICD-10-CM

## 2015-06-17 NOTE — Patient Outreach (Signed)
[facility]) Care Management  06/17/2015  Janiylah Cooksley 1950/04/17 [id number]  Telephonic assessment today.  Patient's respiratory problem with a mycobacterium infection is improved and she has been taken off antibiotic treatment for same.  Patient's chief concern presently is knee pain.  States she saw an orthopedist last month who will do knee replacement surgery but not until she loses 50 pounds.  Patient states her #1 goal is to lose the weight.  She has lost weight over the past 9 months, so she is hopeful that she can achieve her goal.  She has a nutrition consult scheduled for 07-02-15.   Plan:  Patient will continue efforts to lose weight, including keeping appointment for nutrition consult.           RN will mail EMMI articles on nutrition/weight loss.           RN will follow up in approximately one month.  Candie Mile, RN, MSN [place] [phone] Fax [phone]

## 2015-06-18 DIAGNOSIS — J939 Pneumothorax, unspecified: Secondary | ICD-10-CM | POA: Diagnosis not present

## 2015-06-19 ENCOUNTER — Encounter: Payer: Self-pay | Admitting: Geriatric Medicine

## 2015-07-02 ENCOUNTER — Ambulatory Visit: Payer: PPO | Admitting: *Deleted

## 2015-07-09 ENCOUNTER — Other Ambulatory Visit: Payer: Self-pay

## 2015-07-09 NOTE — Patient Outreach (Signed)
[facility]) Care Management  07/09/2015  Stacey Page 1951/01/30 [id number]   Unsuccessful attempt to reach patient.  HIPPA appropriate message left. RN will make another attempt to reach patient  Within 10 working days.  Candie Mile, RN, MSN [place] [phone] Fax [phone]

## 2015-07-21 ENCOUNTER — Other Ambulatory Visit: Payer: Self-pay

## 2015-07-21 NOTE — Patient Outreach (Signed)
[facility]) Care Management  07/21/2015  Garyn Bolla September 18, 1950 [id number]  Second unsuccessful attempt to reach patient.  Message left requesting call back.  If no response, RN will make another attempt within 5 working days.  Candie Mile, RN, MSN [place] [phone] Fax [phone]

## 2015-07-23 ENCOUNTER — Other Ambulatory Visit: Payer: Self-pay

## 2015-07-23 NOTE — Patient Outreach (Addendum)
[facility]) Care Management  07/23/2015  Stacey Page 05/25/1950 [id number]   Third unsuccessful attempt to reach patient.  HIPPA appropriate message left requesting call back. If no response within 10 days, the case will be closed.  Candie Mile, RN, MSN [place] [phone] Fax [phone]

## 2015-07-24 ENCOUNTER — Ambulatory Visit: Payer: PPO | Admitting: Pulmonary Disease

## 2015-08-12 ENCOUNTER — Encounter: Payer: Self-pay | Admitting: Skilled Nursing Facility1

## 2015-08-12 ENCOUNTER — Encounter: Payer: PPO | Attending: Internal Medicine | Admitting: Skilled Nursing Facility1

## 2015-08-12 VITALS — Ht 68.0 in | Wt 299.7 lb

## 2015-08-12 DIAGNOSIS — Z713 Dietary counseling and surveillance: Secondary | ICD-10-CM | POA: Diagnosis not present

## 2015-08-12 DIAGNOSIS — E119 Type 2 diabetes mellitus without complications: Secondary | ICD-10-CM | POA: Diagnosis not present

## 2015-08-12 NOTE — Progress Notes (Signed)
Diabetes Self-Management Education  Visit Type:    Appt. Start Time: 2:00pm Appt. End Time: 2:45  08/12/2015  [name],   Dietary recall: Morning: banana and granola bar Lunch: [place], onions, corn, cheesy potatoes Dinner: chicken, vegetables, brown rice Beverages: water *meals outside the home: 1-2 Pt states she is suffering from knee pain and wants to discuss the sleeve gastrectomy. Pt states she was 305 pounds last week. Pt states she will do better with the surgery because she is fine with food because her daughter cooks for her. Pt arrived using a rolling/seated walker and had her grandson push her around. Pt states her physician told her if she gets to 260 pounds he will replace her knees. Pt states she thought she could get the sleeve gastrectomy next month. Pt states she has been using the walker for about a month. Pt states it is Hard to use the restroom because she can't get off the toilet so she has her grandson help pull her up. Pt states she has never done PT/OT. Pt expressed some disappointment with her physician and states she needed pain medication but the "doctor only does things when she wants to". Pt states she is Going to [employer] at the end of the month and sometimes goes clubbing. Pt states she uses a C-PAP machine to sleep every night. Pt states she does not check her blood sugar. Pt states she is up from 2am to 5 am every morning.  Pt states she is not counting her carbohydrates and is not concerned with her diabetes. Pt states she struggles with constipation. Pt states she is doing well with her wt loss and now has less struggle physically than a few months ago.Pt states her hands stay numb throughout the day. Pt states she drives herself. Pt states she has her daughter wipe her after having a bowel movement.     Pt did not allow Dietitian offer much education or recommendations as soon as dietitian finished explaining the bariatric surgery process and the pt  decided she will not get bariatric surgery she stated she does not need anything else and then continued to regal the dietitian with her tails.   ASSESSMENT  Height 5\' 8"  (1.727 m), weight 299 lb 11.2 oz (135.943 kg). Body mass index is 45.58 kg/(m^2).

## 2015-08-12 NOTE — Patient Instructions (Signed)
-  Speak with your doctor about occupational/physical therapy for mobility in your home -Try kefir: in the yogurt aisle -Try to be as active as you can every day

## 2015-08-13 ENCOUNTER — Ambulatory Visit (INDEPENDENT_AMBULATORY_CARE_PROVIDER_SITE_OTHER): Payer: PPO | Admitting: Internal Medicine

## 2015-08-13 ENCOUNTER — Encounter: Payer: Self-pay | Admitting: Internal Medicine

## 2015-08-13 VITALS — BP 128/68 | HR 60 | Temp 97.8°F | Resp 20 | Ht 68.0 in | Wt 301.0 lb

## 2015-08-13 DIAGNOSIS — E099 Drug or chemical induced diabetes mellitus without complications: Secondary | ICD-10-CM

## 2015-08-13 DIAGNOSIS — T380X1A Poisoning by glucocorticoids and synthetic analogues, accidental (unintentional), initial encounter: Secondary | ICD-10-CM

## 2015-08-13 DIAGNOSIS — I5032 Chronic diastolic (congestive) heart failure: Secondary | ICD-10-CM | POA: Diagnosis not present

## 2015-08-13 DIAGNOSIS — R208 Other disturbances of skin sensation: Secondary | ICD-10-CM

## 2015-08-13 DIAGNOSIS — M25561 Pain in right knee: Secondary | ICD-10-CM | POA: Diagnosis not present

## 2015-08-13 DIAGNOSIS — T380X5A Adverse effect of glucocorticoids and synthetic analogues, initial encounter: Secondary | ICD-10-CM

## 2015-08-13 DIAGNOSIS — R200 Anesthesia of skin: Secondary | ICD-10-CM

## 2015-08-13 MED ORDER — HYDROCODONE-ACETAMINOPHEN 7.5-325 MG PO TABS: 1.0000 | ORAL_TABLET | Freq: Two times a day (BID) | ORAL | Status: DC | PRN

## 2015-08-13 MED ORDER — GABAPENTIN 300 MG PO CAPS: 300.0000 mg | ORAL_CAPSULE | Freq: Two times a day (BID) | ORAL | Status: DC | PRN

## 2015-08-13 MED ORDER — ALPRAZOLAM 0.25 MG PO TABS: 0.2500 mg | ORAL_TABLET | Freq: Two times a day (BID) | ORAL | Status: DC | PRN

## 2015-08-13 NOTE — Assessment & Plan Note (Signed)
Suspect carpal tunnel but she could not perform the screening test in the office due to weakness in her arms. Rx for gabapentin for the pain. Advised to get OTC braces and call back if pain not improved.

## 2015-08-13 NOTE — Assessment & Plan Note (Signed)
No flare today, taking lasix and lisinopril and labetalol. Needs referral for home health PT and OT due to poor functional status and mobility. She also needs teaching on her mobility aid and optimization of the home environment.

## 2015-08-13 NOTE — Patient Instructions (Signed)
We have given you the gabapentin for the nerve pain in the arms. You can take 1 pill up to 3 times per day.   Get some wrist splints over the counter for carpal tunnel to wear at night time and during the day when you are not active.   If you are not getting relief by Monday call us back and we will get you in with the hand surgeon.   Carpal Tunnel Syndrome Carpal tunnel syndrome is a condition that causes pain in your hand and arm. The carpal tunnel is a narrow area located on the palm side of your wrist. Repeated wrist motion or certain diseases may cause swelling within the tunnel. This swelling pinches the main nerve in the wrist (median nerve). CAUSES  This condition may be caused by:   Repeated wrist motions.  Wrist injuries.  Arthritis.  A cyst or tumor in the carpal tunnel.  Fluid buildup during pregnancy. Sometimes the cause of this condition is not known.  RISK FACTORS This condition is more likely to develop in:   People who have jobs that cause them to repeatedly move their wrists in the same motion, such as butchers and cashiers.  Women.  People with certain conditions, such as:  Diabetes.  Obesity.  An underactive thyroid (hypothyroidism).  Kidney failure. SYMPTOMS  Symptoms of this condition include:   A tingling feeling in your fingers, especially in your thumb, index, and middle fingers.  Tingling or numbness in your hand.  An aching feeling in your entire arm, especially when your wrist and elbow are bent for long periods of time.  Wrist pain that goes up your arm to your shoulder.  Pain that goes down into your palm or fingers.  A weak feeling in your hands. You may have trouble grabbing and holding items. Your symptoms may feel worse during the night.  DIAGNOSIS  This condition is diagnosed with a medical history and physical exam. You may also have tests, including:   An electromyogram (EMG). This test measures electrical signals sent by  your nerves into the muscles.  X-rays. TREATMENT  Treatment for this condition includes:  Lifestyle changes. It is important to stop doing or modify the activity that caused your condition.  Physical or occupational therapy.  Medicines for pain and inflammation. This may include medicine that is injected into your wrist.  A wrist splint.  Surgery. HOME CARE INSTRUCTIONS  If You Have a Splint:  Wear it as told by your health care provider. Remove it only as told by your health care provider.  Loosen the splint if your fingers become numb and tingle, or if they turn cold and blue.  Keep the splint clean and dry. General Instructions  Take over-the-counter and prescription medicines only as told by your health care provider.  Rest your wrist from any activity that may be causing your pain. If your condition is work related, talk to your employer about changes that can be made, such as getting a wrist pad to use while typing.  If directed, apply ice to the painful area:  Put ice in a plastic bag.  Place a towel between your skin and the bag.  Leave the ice on for 20 minutes, 2-3 times per day.  Keep all follow-up visits as told by your health care provider. This is important.  Do any exercises as told by your health care provider, physical therapist, or occupational therapist. [place] IF:   You have new symptoms.  Your pain is not controlled with medicines.  Your symptoms get worse.   This information is not intended to replace advice given to you by your health care provider. Make sure you discuss any questions you have with your health care provider.   Document Released: 01/22/2000 Document Revised: 10/15/2014 Document Reviewed: 06/11/2014 Elsevier Interactive Patient Education [employer].

## 2015-08-13 NOTE — Progress Notes (Signed)
   Subjective:    Patient ID: Stacey Page, female    DOB: 1950/10/25, 65 y.o.   MRN: [id number]  HPI The patient is a 65 YO female coming in for new problem of numbness and pain in her hands. Started about 2 weeks ago when she returned from visiting [place]. She is having it anytime during the day. Mostly when she wakes up and can hurt all day. Pain and weakness in her hands. No pain in her shoulders or neck.   Review of Systems  Constitutional: Positive for activity change and fatigue. Negative for fever, chills, appetite change and unexpected weight change.  Respiratory: Negative for cough, chest tightness, shortness of breath and wheezing.        Improving  Cardiovascular: Negative for chest pain, palpitations and leg swelling.  Gastrointestinal: Negative for nausea, abdominal pain, diarrhea, constipation and abdominal distention.  Musculoskeletal: Positive for arthralgias and gait problem.  Skin: Negative.   Neurological: Positive for weakness and numbness. Negative for dizziness, light-headedness and headaches.  Psychiatric/Behavioral: Positive for sleep disturbance, dysphoric mood and decreased concentration. Negative for suicidal ideas, behavioral problems and self-injury. The patient is nervous/anxious.       Objective:   Physical Exam  Constitutional: She is oriented to person, place, and time. She appears well-developed and well-nourished.  Overweight  HENT:  Head: Normocephalic and atraumatic.  Eyes: EOM are normal.  Cardiovascular: Normal rate and regular rhythm.   Pulmonary/Chest: Effort normal and breath sounds normal. No respiratory distress.  Some mild crackles, not new  Abdominal: Soft. Bowel sounds are normal. She exhibits no distension. There is no tenderness. There is no rebound.  Musculoskeletal: She exhibits no edema.  She is unable to perform the tinnel maneuver due to being unable to hold up her arms and had to lie them down after 5 seconds so test could not be  performed.   Neurological: She is alert and oriented to person, place, and time. Coordination normal.  Skin: Skin is warm and dry.  Psychiatric: She has a normal mood and affect.   Filed Vitals:   08/13/15 1123  BP: 128/68  Pulse: 60  Temp: 97.8 F (36.6 C)  TempSrc: Oral  Resp: 20  Height: 5\' 8"  (1.727 m)  Weight: 301 lb (136.533 kg)      Assessment & Plan:

## 2015-08-13 NOTE — Assessment & Plan Note (Signed)
Referral for home health for PT and OT as her diabetes would be much improved with mobility and for her QOL.

## 2015-08-13 NOTE — Progress Notes (Signed)
Pre visit review using our clinic review tool, if applicable. No additional management support is needed unless otherwise documented below in the visit note. 

## 2015-08-13 NOTE — Assessment & Plan Note (Signed)
Limiting her mobility and needs HH OT and PT for improvement of her mobility and she is not utilizing her mobility aid properly and her safety is not good.

## 2015-08-18 ENCOUNTER — Ambulatory Visit (INDEPENDENT_AMBULATORY_CARE_PROVIDER_SITE_OTHER): Payer: PPO | Admitting: Internal Medicine

## 2015-08-18 ENCOUNTER — Other Ambulatory Visit: Payer: Self-pay | Admitting: Geriatric Medicine

## 2015-08-18 ENCOUNTER — Encounter: Payer: Self-pay | Admitting: Internal Medicine

## 2015-08-18 ENCOUNTER — Telehealth: Payer: Self-pay | Admitting: Internal Medicine

## 2015-08-18 VITALS — BP 120/78 | HR 65 | Temp 98.2°F | Ht 68.0 in | Wt 301.0 lb

## 2015-08-18 DIAGNOSIS — A319 Mycobacterial infection, unspecified: Secondary | ICD-10-CM | POA: Diagnosis not present

## 2015-08-18 MED ORDER — MONTELUKAST SODIUM 10 MG PO TABS: ORAL_TABLET | ORAL | Status: DC

## 2015-08-18 NOTE — Assessment & Plan Note (Signed)
I am very hopeful that her Mycobacterium avium pneumonia has been cured. She will follow-up here as needed.

## 2015-08-18 NOTE — Progress Notes (Signed)
[facility] for Infectious Disease  Patient Active Problem List   Diagnosis Date Noted  . Atypical mycobacterium infection     Priority: High  . ILD (interstitial lung disease) ([place])     Priority: Medium  . Numbness in both hands 08/13/2015  . Panic attack 06/09/2015  . Right knee pain 05/14/2015  . Chronic renal insufficiency 09/16/2014  . Pneumothorax on right   . Routine general medical examination at a health care facility 04/03/2014  . Morbid obesity ([place]) 04/03/2014  . OSA (obstructive sleep apnea) 02/25/2014  . Chronic diastolic heart failure ([place]) 02/25/2014  . Controlled steroid-induced diabetes mellitus ([place]) 02/25/2014  . Essential hypertension 02/25/2014    Patient's Medications  New Prescriptions   No medications on file  Previous Medications   ALBUTEROL (PROAIR HFA) 108 (90 BASE) MCG/ACT INHALER    Inhale 2 puffs into the lungs every 6 (six) hours as needed for wheezing or shortness of breath.   ALPRAZOLAM (XANAX) 0.25 MG TABLET    Take 1 tablet (0.25 mg total) by mouth 2 (two) times daily as needed for anxiety.   BUDESONIDE-FORMOTEROL (SYMBICORT) 160-4.5 MCG/ACT INHALER    Inhale 2 puffs into the lungs 2 (two) times daily.   CLOBETASOL PROP EMOLLIENT BASE (CLOBETASOL PROPIONATE E) 0.05 % EMOLLIENT CREAM    Apply 1 application topically daily as needed (skin irritations).    FLUTICASONE (FLONASE) 50 MCG/ACT NASAL SPRAY    Place 1 spray into both nostrils daily.   FUROSEMIDE (LASIX) 40 MG TABLET    TAKE 1 TABLET (40 MG TOTAL) BY MOUTH DAILY.   GABAPENTIN (NEURONTIN) 300 MG CAPSULE    Take 1 capsule (300 mg total) by mouth 2 (two) times daily as needed.   HYDROCODONE-ACETAMINOPHEN (NORCO) 7.5-325 MG TABLET    Take 1 tablet by mouth 2 (two) times daily as needed for moderate pain.   LABETALOL (NORMODYNE) 200 MG TABLET    TAKE 1 TABLET BY MOUTH TWICE A DAY   LISINOPRIL (PRINIVIL,ZESTRIL) 20 MG TABLET    Take 1 tablet (20 mg total) by mouth daily.   MONTELUKAST (SINGULAIR) 10 MG TABLET    TAKE 1 TABLET (10 MG TOTAL) BY MOUTH AT BEDTIME.   NAPROXEN (NAPROSYN) 500 MG TABLET    Take 1 tablet (500 mg total) by mouth 2 (two) times daily with a meal.   POTASSIUM CITRATE (UROCIT-K) 10 MEQ (1080 MG) SR TABLET    TAKE 1 TABLET BY MOUTH EVERY DAY   SITAGLIPTIN (JANUVIA) 100 MG TABLET    Take 1 tablet (100 mg total) by mouth daily.  Modified Medications   No medications on file  Discontinued Medications   No medications on file    Subjective: Ms. Albaugh is in for her routine follow-up visit. She completed 12 months of therapy for Mycobacterium avium pneumonia on 06/16/2015. She has had no new cough, sputum production, shortness of breath or fever. She is still trying hard to lose weight so she will be able to undergo bilateral knee replacement surgery. She has lost 30 pounds but hopes to lose 40 more before she has surgery.  Review of Systems: Review of Systems  Constitutional: Positive for weight loss. Negative for fever, chills and diaphoresis.  Respiratory: Negative for cough, hemoptysis, sputum production, shortness of breath and wheezing.   Cardiovascular: Negative for chest pain.    Past Medical History  Diagnosis Date  . HTN (hypertension)   . Asthma   . Seasonal allergies   .  OA (osteoarthritis)   . Interstitial lung disease ([place]) 03/26/2014  . OSA (obstructive sleep apnea) 02/25/2014    wears CPAP at night  . Anxiety   . Depression   . Diabetes ([place])     Type 2  . Acute renal failure [facility]) May 19, 2014  . GERD (gastroesophageal reflux disease)   . Swelling of ankle     Bilateral; takes Lasix  . Aortic stenosis, mild     mild AS 01/2014 echo  . Shortness of breath on exertion   . COPD (chronic obstructive pulmonary disease) ([place])     Social History  Substance Use Topics  . Smoking status: Former Smoker -- 0.25 packs/day for 15 years    Types: Cigarettes    Quit date: 02/08/1980  . Smokeless tobacco: None      Comment: 2 packs per week  . Alcohol Use: No     Comment: occassional/social/rare    Family History  Problem Relation Age of Onset  . COPD Father   . Cancer Mother     No Known Allergies  Objective: Filed Vitals:   08/18/15 1113  BP: 120/78  Pulse: 65  Temp: 98.2 F (36.8 C)  TempSrc: Oral  Height: 5\' 8"  (1.727 m)  Weight: 301 lb (136.533 kg)   Body mass index is 45.78 kg/(m^2).  Physical Exam  Constitutional: She is oriented to person, place, and time. No distress.  Cardiovascular: Normal rate and regular rhythm.   No murmur heard. Pulmonary/Chest: Effort normal and breath sounds normal. She has no wheezes. She has no rales.  Neurological: She is alert and oriented to person, place, and time.  Skin: No rash noted.  Psychiatric: Mood and affect normal.    Lab Results    Problem List Items Addressed This Visit      High   Atypical mycobacterium infection - Primary    I am very hopeful that her Mycobacterium avium pneumonia has been cured. She will follow-up here as needed.          Michel Bickers, MD [facility] for [facility] [place] [facility] [phone] pager   [phone] cell 08/18/2015, 11:35 AM

## 2015-08-18 NOTE — Telephone Encounter (Signed)
Pt called in and said that the montelukast (SINGULAIR) 10 MG tablet [id number] was not called in on Friday when she was here.  Can this be called in today?

## 2015-08-18 NOTE — Telephone Encounter (Signed)
Sent to pharmacy 

## 2015-08-21 ENCOUNTER — Telehealth: Payer: Self-pay | Admitting: Pulmonary Disease

## 2015-08-21 NOTE — Telephone Encounter (Signed)
Spoke with pt, states that she wants a letter to take her cpap and distilled water with her on her upcoming flight to and from [facility].  Pt has an appt on Monday and would like to pick this letter up at her OV.    Forwarding to VS and Ashtyn to make aware.

## 2015-08-24 ENCOUNTER — Encounter: Payer: Self-pay | Admitting: Pulmonary Disease

## 2015-08-24 ENCOUNTER — Ambulatory Visit (INDEPENDENT_AMBULATORY_CARE_PROVIDER_SITE_OTHER): Payer: PPO | Admitting: Pulmonary Disease

## 2015-08-24 VITALS — BP 112/72 | HR 72 | Ht 68.0 in | Wt 298.4 lb

## 2015-08-24 DIAGNOSIS — G4733 Obstructive sleep apnea (adult) (pediatric): Secondary | ICD-10-CM

## 2015-08-24 DIAGNOSIS — G4700 Insomnia, unspecified: Secondary | ICD-10-CM | POA: Diagnosis not present

## 2015-08-24 DIAGNOSIS — J4530 Mild persistent asthma, uncomplicated: Secondary | ICD-10-CM | POA: Diagnosis not present

## 2015-08-24 DIAGNOSIS — Z9989 Dependence on other enabling machines and devices: Principal | ICD-10-CM

## 2015-08-24 MED ORDER — SUVOREXANT 10 MG PO TABS: 10.0000 mg | ORAL_TABLET | Freq: Every day | ORAL | Status: DC

## 2015-08-24 NOTE — Telephone Encounter (Signed)
Letter printed.

## 2015-08-24 NOTE — Patient Instructions (Signed)
Belsomra 10 mg nightly before bedtime  Follow up in 1 year

## 2015-08-24 NOTE — Progress Notes (Signed)
Current Outpatient Prescriptions on File Prior to Visit  Medication Sig  . albuterol (PROAIR HFA) 108 (90 BASE) MCG/ACT inhaler Inhale 2 puffs into the lungs every 6 (six) hours as needed for wheezing or shortness of breath.  . ALPRAZolam (XANAX) 0.25 MG tablet Take 1 tablet (0.25 mg total) by mouth 2 (two) times daily as needed for anxiety.  . budesonide-formoterol (SYMBICORT) 160-4.5 MCG/ACT inhaler Inhale 2 puffs into the lungs 2 (two) times daily.  . Clobetasol Prop Emollient Base (CLOBETASOL PROPIONATE E) 0.05 % emollient cream Apply 1 application topically daily as needed (skin irritations).   . fluticasone (FLONASE) 50 MCG/ACT nasal spray Place 1 spray into both nostrils daily.  . furosemide (LASIX) 40 MG tablet TAKE 1 TABLET (40 MG TOTAL) BY MOUTH DAILY.  Marland Kitchen gabapentin (NEURONTIN) 300 MG capsule Take 1 capsule (300 mg total) by mouth 2 (two) times daily as needed.  Marland Kitchen HYDROcodone-acetaminophen (NORCO) 7.5-325 MG tablet Take 1 tablet by mouth 2 (two) times daily as needed for moderate pain.  Marland Kitchen labetalol (NORMODYNE) 200 MG tablet TAKE 1 TABLET BY MOUTH TWICE A DAY  . lisinopril (PRINIVIL,ZESTRIL) 20 MG tablet Take 1 tablet (20 mg total) by mouth daily.  . montelukast (SINGULAIR) 10 MG tablet TAKE 1 TABLET (10 MG TOTAL) BY MOUTH AT BEDTIME.  Marland Kitchen potassium citrate (UROCIT-K) 10 MEQ (1080 MG) SR tablet TAKE 1 TABLET BY MOUTH EVERY DAY  . sitaGLIPtin (JANUVIA) 100 MG tablet Take 1 tablet (100 mg total) by mouth daily.   No current facility-administered medications on file prior to visit.     Chief Complaint  Patient presents with  . Follow-up    Pt having difficulty going back to sleep if awakens during the night. Pt reports that CPAP is working well. Using nightly. DME: [employer] Romans; Needs a letter to travel with CPAP     Pulmonary tests Echo 01/28/14 >> severe LVH, EF 65 to [id number], grade 2 diastolic dysfx, mild AS, mild/mod LA. PFT 03/12/14 >> FEV1 2.05 (88%), FEV1% 87, TLC 4.14 (75%), DLCO 52%, no  BD HRCT chest 03/20/14 >> multiple small nodules, patchy GGO, mild BTX, air trapping VATS lung bx 06/10/14 >> bronchiolitis with non necrotizing granuloma and AFB  Sleep tests HST 02/12/14 >> AHI 59.3, SaO2 low 67% Auto CPAP 07/22/15 to 08/20/15 >> used on 27 of 30 nights with average 6 hrs 41 min.  Average AHI 4.8 with median CPAP 10 and 95 th percentile CPAP 13 cm H2O  Past medical hx Diastolic CHF, HTN, Diastolic CHF, DM type II, MAI (completed therapy July 2017)  Past surgical hx, Allergies, Family hx, Social hx all reviewed.  Vital Signs BP 112/72 mmHg  Pulse 72  Ht 5\' 8"  (1.727 m)  Wt 298 lb 6.4 oz (135.353 kg)  BMI 45.38 kg/m2  SpO2 97%  History of Present Illness Stacey Page is a 65 y.o. female former smoker with asthma, and OSA.  She was seen by ID earlier this month and released from their clinic.  She is going on trip to [facility]  Her breathing is doing good.  She is not needing inhalers.  She denies cough, wheeze, or sputum.  She has no issues with CPAP.  She has trouble staying asleep.  She wakes up every 2 hours.  She has tried xanax >> no help.  She needs to lose about 40 lbs before she can have knee replacement surgery.   Physical Exam  General - No distress ENT - No sinus tenderness, no oral  exudate, no LAN Cardiac - s1s2 regular, no murmur Chest - No wheeze/rales/dullness Back - No focal tenderness Abd - Soft, non-tender Ext - No edema Neuro - Normal strength Skin - No rashes Psych - normal mood, and behavior   Assessment/Plan  Asthma. - continue singulair - she uses symbicort, albuterol when her symptoms get worse >> usually in Spring  OSA. - continue auto CPAP  Insomnia. - will have her try belsomra   Patient Instructions  Belsomra 10 mg nightly before bedtime  Follow up in 1 year     Chesley Mires, MD [place] Pager:  [phone] 08/24/2015, 9:28 AM

## 2015-08-24 NOTE — Telephone Encounter (Signed)
Letter given to patient today at her appt today. Nothing further needed.

## 2015-08-26 DIAGNOSIS — I5032 Chronic diastolic (congestive) heart failure: Secondary | ICD-10-CM | POA: Diagnosis not present

## 2015-08-26 DIAGNOSIS — M25561 Pain in right knee: Secondary | ICD-10-CM | POA: Diagnosis not present

## 2015-08-26 DIAGNOSIS — M6281 Muscle weakness (generalized): Secondary | ICD-10-CM | POA: Diagnosis not present

## 2015-08-26 DIAGNOSIS — I10 Essential (primary) hypertension: Secondary | ICD-10-CM | POA: Diagnosis not present

## 2015-08-26 DIAGNOSIS — M25562 Pain in left knee: Secondary | ICD-10-CM | POA: Diagnosis not present

## 2015-08-26 DIAGNOSIS — R2681 Unsteadiness on feet: Secondary | ICD-10-CM | POA: Diagnosis not present

## 2015-08-27 DIAGNOSIS — J4530 Mild persistent asthma, uncomplicated: Secondary | ICD-10-CM | POA: Diagnosis not present

## 2015-08-27 DIAGNOSIS — G4733 Obstructive sleep apnea (adult) (pediatric): Secondary | ICD-10-CM | POA: Diagnosis not present

## 2015-08-27 DIAGNOSIS — G4700 Insomnia, unspecified: Secondary | ICD-10-CM | POA: Diagnosis not present

## 2015-08-31 DIAGNOSIS — R2681 Unsteadiness on feet: Secondary | ICD-10-CM | POA: Diagnosis not present

## 2015-08-31 DIAGNOSIS — M25561 Pain in right knee: Secondary | ICD-10-CM | POA: Diagnosis not present

## 2015-08-31 DIAGNOSIS — I5032 Chronic diastolic (congestive) heart failure: Secondary | ICD-10-CM | POA: Diagnosis not present

## 2015-08-31 DIAGNOSIS — I10 Essential (primary) hypertension: Secondary | ICD-10-CM | POA: Diagnosis not present

## 2015-08-31 DIAGNOSIS — M6281 Muscle weakness (generalized): Secondary | ICD-10-CM | POA: Diagnosis not present

## 2015-08-31 DIAGNOSIS — M25562 Pain in left knee: Secondary | ICD-10-CM | POA: Diagnosis not present

## 2015-09-01 DIAGNOSIS — I5032 Chronic diastolic (congestive) heart failure: Secondary | ICD-10-CM | POA: Diagnosis not present

## 2015-09-01 DIAGNOSIS — M25561 Pain in right knee: Secondary | ICD-10-CM | POA: Diagnosis not present

## 2015-09-01 DIAGNOSIS — I10 Essential (primary) hypertension: Secondary | ICD-10-CM | POA: Diagnosis not present

## 2015-09-01 DIAGNOSIS — G4733 Obstructive sleep apnea (adult) (pediatric): Secondary | ICD-10-CM | POA: Diagnosis not present

## 2015-09-01 DIAGNOSIS — R2681 Unsteadiness on feet: Secondary | ICD-10-CM | POA: Diagnosis not present

## 2015-09-01 DIAGNOSIS — M6281 Muscle weakness (generalized): Secondary | ICD-10-CM | POA: Diagnosis not present

## 2015-09-01 DIAGNOSIS — M25562 Pain in left knee: Secondary | ICD-10-CM | POA: Diagnosis not present

## 2015-09-03 ENCOUNTER — Telehealth: Payer: Self-pay

## 2015-09-03 NOTE — Telephone Encounter (Signed)
Fine with me

## 2015-09-03 NOTE — Telephone Encounter (Signed)
Patient is requesting a new PCP. She does not feel her and Dr. Sharlet Salina have good chemistry. She states she had seen Dr. Ronnald Ramp once before and would like to switch to him. It looks like that visit was in 05/2014.  Dr.Crawford ok with you? Dr. Ronnald Ramp ok with you?

## 2015-09-04 NOTE — Telephone Encounter (Signed)
I am not available to see her Maybe the new NP Baldo Ash would be a good choice for her

## 2015-09-04 NOTE — Telephone Encounter (Signed)
I called patient and informed her that Dr. Ronnald Ramp is just not able to take on any patients at this time. They we do have new PCP starting in August sometime and that she could be a good fit for her. Once Baldo Ash get started we could give her a call about about transferring over to her.

## 2015-09-09 DIAGNOSIS — M6281 Muscle weakness (generalized): Secondary | ICD-10-CM | POA: Diagnosis not present

## 2015-09-09 DIAGNOSIS — I10 Essential (primary) hypertension: Secondary | ICD-10-CM | POA: Diagnosis not present

## 2015-09-09 DIAGNOSIS — M25561 Pain in right knee: Secondary | ICD-10-CM | POA: Diagnosis not present

## 2015-09-09 DIAGNOSIS — I5032 Chronic diastolic (congestive) heart failure: Secondary | ICD-10-CM | POA: Diagnosis not present

## 2015-09-09 DIAGNOSIS — R2681 Unsteadiness on feet: Secondary | ICD-10-CM | POA: Diagnosis not present

## 2015-09-09 DIAGNOSIS — M25562 Pain in left knee: Secondary | ICD-10-CM | POA: Diagnosis not present

## 2015-09-11 DIAGNOSIS — M25561 Pain in right knee: Secondary | ICD-10-CM | POA: Diagnosis not present

## 2015-09-11 DIAGNOSIS — M25562 Pain in left knee: Secondary | ICD-10-CM | POA: Diagnosis not present

## 2015-09-11 DIAGNOSIS — I10 Essential (primary) hypertension: Secondary | ICD-10-CM | POA: Diagnosis not present

## 2015-09-11 DIAGNOSIS — M6281 Muscle weakness (generalized): Secondary | ICD-10-CM | POA: Diagnosis not present

## 2015-09-11 DIAGNOSIS — R2681 Unsteadiness on feet: Secondary | ICD-10-CM | POA: Diagnosis not present

## 2015-09-11 DIAGNOSIS — I5032 Chronic diastolic (congestive) heart failure: Secondary | ICD-10-CM | POA: Diagnosis not present

## 2015-09-14 DIAGNOSIS — I10 Essential (primary) hypertension: Secondary | ICD-10-CM | POA: Diagnosis not present

## 2015-09-14 DIAGNOSIS — M25561 Pain in right knee: Secondary | ICD-10-CM | POA: Diagnosis not present

## 2015-09-14 DIAGNOSIS — R2681 Unsteadiness on feet: Secondary | ICD-10-CM | POA: Diagnosis not present

## 2015-09-14 DIAGNOSIS — I5032 Chronic diastolic (congestive) heart failure: Secondary | ICD-10-CM | POA: Diagnosis not present

## 2015-09-14 DIAGNOSIS — M25562 Pain in left knee: Secondary | ICD-10-CM | POA: Diagnosis not present

## 2015-09-14 DIAGNOSIS — M6281 Muscle weakness (generalized): Secondary | ICD-10-CM | POA: Diagnosis not present

## 2015-09-15 ENCOUNTER — Other Ambulatory Visit: Payer: Self-pay

## 2015-09-15 NOTE — Patient Outreach (Signed)
[place]) Care Management  09/15/2015  Stacey Page 12/28/1950 [id number]   Telephone call to patient after receiving message from patient requesting call back.  Patient was previously discharged from [facility] disease management on August 18, 2015 due to inability to maintain contact.  Patient requests [place] services be resumed.  States difficulty contacting her was because she was out of state visiting family.  Screening completed today.  Patient scheduled for readmission assessment due to diabetes and CHF.  Candie Mile, RN, MSN [place] [phone] Fax [phone]

## 2015-09-16 ENCOUNTER — Telehealth: Payer: Self-pay

## 2015-09-16 NOTE — Telephone Encounter (Signed)
Home Health Cert/Plan of Care received (08/26/2015 - 10/24/2015) and placed on MD's desk for signature

## 2015-09-16 NOTE — Telephone Encounter (Signed)
Paperwork signed, faxed, copy sent to scan 

## 2015-09-17 ENCOUNTER — Ambulatory Visit: Payer: Self-pay

## 2015-09-18 ENCOUNTER — Other Ambulatory Visit: Payer: Self-pay | Admitting: *Deleted

## 2015-09-18 MED ORDER — MONTELUKAST SODIUM 10 MG PO TABS: ORAL_TABLET | ORAL | 1 refills | Status: DC

## 2015-09-22 DIAGNOSIS — R2681 Unsteadiness on feet: Secondary | ICD-10-CM

## 2015-09-22 DIAGNOSIS — M25561 Pain in right knee: Secondary | ICD-10-CM | POA: Diagnosis not present

## 2015-09-22 DIAGNOSIS — I5032 Chronic diastolic (congestive) heart failure: Secondary | ICD-10-CM | POA: Diagnosis not present

## 2015-09-22 DIAGNOSIS — I10 Essential (primary) hypertension: Secondary | ICD-10-CM | POA: Diagnosis not present

## 2015-09-22 DIAGNOSIS — M25562 Pain in left knee: Secondary | ICD-10-CM | POA: Diagnosis not present

## 2015-09-23 ENCOUNTER — Other Ambulatory Visit: Payer: Self-pay

## 2015-09-23 NOTE — Patient Outreach (Signed)
[facility] [facility]) Care Management  09/23/2015  Stacey Page Apr 26, 1950 [id number]  Telephone contact for the purpose of readmitting patient for telephonic health coach services.  However, patient states today that she is now getting home health services, and may not need [facility] as long as HH is involved.  She states she is not sure if she will have an Register, but she has an appointment for a [facility] visit tomorrow.  Encouraged patient to discuss [facility] services at tomorrow's appointment, and call me back if she desires [facility] services. If no call back received, RN will contact patient again within 2 weeks.   Candie Mile, RN, MSN [place] [phone] Fax [phone]

## 2015-09-24 DIAGNOSIS — I5032 Chronic diastolic (congestive) heart failure: Secondary | ICD-10-CM | POA: Diagnosis not present

## 2015-09-24 DIAGNOSIS — I10 Essential (primary) hypertension: Secondary | ICD-10-CM | POA: Diagnosis not present

## 2015-09-24 DIAGNOSIS — M25562 Pain in left knee: Secondary | ICD-10-CM | POA: Diagnosis not present

## 2015-09-24 DIAGNOSIS — M25561 Pain in right knee: Secondary | ICD-10-CM | POA: Diagnosis not present

## 2015-09-24 DIAGNOSIS — R2681 Unsteadiness on feet: Secondary | ICD-10-CM | POA: Diagnosis not present

## 2015-09-24 DIAGNOSIS — M6281 Muscle weakness (generalized): Secondary | ICD-10-CM | POA: Diagnosis not present

## 2015-09-29 DIAGNOSIS — M25561 Pain in right knee: Secondary | ICD-10-CM | POA: Diagnosis not present

## 2015-09-29 DIAGNOSIS — M6281 Muscle weakness (generalized): Secondary | ICD-10-CM | POA: Diagnosis not present

## 2015-09-29 DIAGNOSIS — I10 Essential (primary) hypertension: Secondary | ICD-10-CM | POA: Diagnosis not present

## 2015-09-29 DIAGNOSIS — I5032 Chronic diastolic (congestive) heart failure: Secondary | ICD-10-CM | POA: Diagnosis not present

## 2015-09-29 DIAGNOSIS — R2681 Unsteadiness on feet: Secondary | ICD-10-CM | POA: Diagnosis not present

## 2015-09-29 DIAGNOSIS — M25562 Pain in left knee: Secondary | ICD-10-CM | POA: Diagnosis not present

## 2015-10-07 ENCOUNTER — Other Ambulatory Visit: Payer: Self-pay

## 2015-10-07 VITALS — Ht 68.0 in | Wt 287.0 lb

## 2015-10-07 DIAGNOSIS — J449 Chronic obstructive pulmonary disease, unspecified: Secondary | ICD-10-CM

## 2015-10-07 NOTE — Patient Outreach (Signed)
[facility]) Care Management  [place]  10/07/2015   Stacey Page [birth year] [id number]  Readmission to [facility] disease management services completed this date.  Patient was closed in July 2017 due to inability to maintain contact.  Patient called 09-15-15 to request services after completion of home health services.  Patient has a history of interstitial lung disease, steroid-induced diabetes, and CHF.  She reports that she needs knee replacement surgery but needs to lose weight before it can be done.    Encounter Medications:  Outpatient Encounter Prescriptions as of 10/07/2015  Medication Sig  . albuterol (PROAIR HFA) 108 (90 BASE) MCG/ACT inhaler Inhale 2 puffs into the lungs every 6 (six) hours as needed for wheezing or shortness of breath.  . budesonide-formoterol (SYMBICORT) 160-4.5 MCG/ACT inhaler Inhale 2 puffs into the lungs 2 (two) times daily.  . fluticasone (FLONASE) 50 MCG/ACT nasal spray Place 1 spray into both nostrils daily.  . furosemide (LASIX) 40 MG tablet TAKE 1 TABLET (40 MG TOTAL) BY MOUTH DAILY.  Marland Kitchen gabapentin (NEURONTIN) 300 MG capsule Take 1 capsule (300 mg total) by mouth 2 (two) times daily as needed.  . labetalol (NORMODYNE) 200 MG tablet TAKE 1 TABLET BY MOUTH TWICE A DAY  . lisinopril (PRINIVIL,ZESTRIL) 20 MG tablet Take 1 tablet (20 mg total) by mouth daily.  . montelukast (SINGULAIR) 10 MG tablet TAKE 1 TABLET (10 MG TOTAL) BY MOUTH AT BEDTIME.  Marland Kitchen potassium citrate (UROCIT-K) 10 MEQ (1080 MG) SR tablet TAKE 1 TABLET BY MOUTH EVERY DAY  . sitaGLIPtin (JANUVIA) 100 MG tablet Take 1 tablet (100 mg total) by mouth daily.  Marland Kitchen ALPRAZolam (XANAX) 0.25 MG tablet Take 1 tablet (0.25 mg total) by mouth 2 (two) times daily as needed for anxiety. (Patient not taking: Reported on 10/07/2015)  . Clobetasol Prop Emollient Base (CLOBETASOL PROPIONATE E) 0.05 % emollient cream Apply 1 application topically daily as needed (skin irritations).   Marland Kitchen  HYDROcodone-acetaminophen (NORCO) 7.5-325 MG tablet Take 1 tablet by mouth 2 (two) times daily as needed for moderate pain. (Patient not taking: Reported on 10/07/2015)  . Suvorexant (BELSOMRA) 10 MG TABS Take 10 mg by mouth at bedtime. (Patient not taking: Reported on 10/07/2015)   No facility-administered encounter medications on file as of 10/07/2015.     Functional Status:  In your present state of health, do you have any difficulty performing the following activities: 10/07/2015 06/17/2015  Hearing? N -  Vision? N -  Difficulty concentrating or making decisions? N -  Walking or climbing stairs? Y -  Dressing or bathing? Y -  Doing errands, shopping? Y -  Conservation officer, nature and eating ? Y Y  Using the Toilet? Y Y  In the past six months, have you accidently leaked urine? Y Y  Do you have problems with loss of bowel control? N N  Managing your Medications? N N  Managing your Finances? N N  Housekeeping or managing your Housekeeping? Tempie Donning  Some recent data might be hidden    Fall/Depression Screening: PHQ 2/9 Scores 10/07/2015 08/18/2015 08/12/2015 06/17/2015 06/16/2015 03/19/2015 11/04/2014  PHQ - 2 Score 1 1 0 1 0 1 0  PHQ- 9 Score - - - - - - -     Plan: Completed initial assessment for [facility] disease management services.           Patient will continue self-management of chronic lung disease.           Patient will make healthy food  choices and limit portions to achieve weight loss goals.           RN will follow up in September.  Candie Mile, RN, MSN [place] [phone] Fax [phone]

## 2015-11-04 ENCOUNTER — Ambulatory Visit: Payer: Self-pay

## 2015-11-05 ENCOUNTER — Other Ambulatory Visit: Payer: Self-pay | Admitting: Internal Medicine

## 2015-11-05 ENCOUNTER — Other Ambulatory Visit: Payer: Self-pay

## 2015-11-05 DIAGNOSIS — Z1231 Encounter for screening mammogram for malignant neoplasm of breast: Secondary | ICD-10-CM

## 2015-11-05 NOTE — Patient Outreach (Signed)
[facility]) Care Management  11/05/2015  Stacey Page Jul 03, 1950 [id number]   Unsuccessful attempt to reach patient. HIPAA appropriate message left requesting call back. If no response RN will make another attempt within 10 working days.  Candie Mile, RN, MSN [place] [phone] Fax [phone]

## 2015-11-12 ENCOUNTER — Other Ambulatory Visit: Payer: Self-pay

## 2015-11-12 ENCOUNTER — Ambulatory Visit
Admission: RE | Admit: 2015-11-12 | Discharge: 2015-11-12 | Disposition: A | Discharge status: Home / Self Care | Payer: PPO | Source: Ambulatory Visit | Attending: Internal Medicine | Admitting: Internal Medicine

## 2015-11-12 DIAGNOSIS — Z1231 Encounter for screening mammogram for malignant neoplasm of breast: Secondary | ICD-10-CM

## 2015-11-12 NOTE — Patient Outreach (Signed)
[place] [facility]) Care Management  11/12/2015  Stacey Page 1950/03/02 [id number]  Second unsuccessful attempt to reach patient.  HIPAA appropriate message left requesting call back. If no response RN will make another attempt within 10 working days.  Candie Mile, RN, MSN [place] [phone] Fax [phone]

## 2015-11-17 NOTE — Telephone Encounter (Signed)
I am ok taking this patient, but for her chronic pain management, please inform patient that I will be sending her to orthopedist.I do not manage chronic pain. Thank you

## 2015-11-17 NOTE — Telephone Encounter (Signed)
Called patient. She made an appointment for NOV 7th. She was informed about her the chronic pain management. Thank you

## 2015-11-17 NOTE — Telephone Encounter (Signed)
Stacey Page are you willing to take her on as a patient?

## 2015-11-18 ENCOUNTER — Other Ambulatory Visit: Payer: Self-pay

## 2015-11-18 DIAGNOSIS — J441 Chronic obstructive pulmonary disease with (acute) exacerbation: Secondary | ICD-10-CM

## 2015-11-18 NOTE — Patient Outreach (Signed)
[facility]) Care Management  11/18/2015  Dashanae Longfield 1950-04-30 [id number]   Telephone contact with patient.  She reports no change in her respiratory status.  Her primary issue remains the chronic knee pain which limits her activity.  She is still trying to get her weight down to 250 pounds so that she can get a knee replacement.  Plan:  Patient will see NP in PCP's office tomorrow.           Patient will get her flu shot while she is there.           Patient will keep orthopedist appointment on 10-17.           RN will follow up in November.  Candie Mile, RN, MSN [place] [phone] Fax [phone]

## 2015-11-19 ENCOUNTER — Encounter: Payer: Self-pay | Admitting: Nurse Practitioner

## 2015-11-19 ENCOUNTER — Ambulatory Visit (INDEPENDENT_AMBULATORY_CARE_PROVIDER_SITE_OTHER): Payer: PPO | Admitting: Nurse Practitioner

## 2015-11-19 ENCOUNTER — Other Ambulatory Visit: Payer: Self-pay | Admitting: *Deleted

## 2015-11-19 VITALS — BP 122/82 | Temp 98.0°F | Ht 68.0 in | Wt 285.0 lb

## 2015-11-19 DIAGNOSIS — M79605 Pain in left leg: Secondary | ICD-10-CM | POA: Diagnosis not present

## 2015-11-19 DIAGNOSIS — R202 Paresthesia of skin: Secondary | ICD-10-CM | POA: Diagnosis not present

## 2015-11-19 DIAGNOSIS — R200 Anesthesia of skin: Secondary | ICD-10-CM | POA: Diagnosis not present

## 2015-11-19 DIAGNOSIS — M79604 Pain in right leg: Secondary | ICD-10-CM

## 2015-11-19 DIAGNOSIS — Z23 Encounter for immunization: Secondary | ICD-10-CM | POA: Diagnosis not present

## 2015-11-19 MED ORDER — GABAPENTIN 300 MG PO CAPS: 300.0000 mg | ORAL_CAPSULE | Freq: Three times a day (TID) | ORAL | 2 refills | Status: DC

## 2015-11-19 NOTE — Progress Notes (Signed)
Pre visit review using our clinic review tool, if applicable. No additional management support is needed unless otherwise documented below in the visit note. 

## 2015-11-19 NOTE — Patient Instructions (Addendum)
Use gabapentin as prescribed. You will be called with appt for ABI test by vascular department.  Consider referral to neurologist and cymbalta use if ABI normal and gabapentin is ineffective.

## 2015-11-19 NOTE — Progress Notes (Signed)
Subjective:  Patient ID: Stacey Page, female    DOB: 07-09-1950  Age: 65 y.o. MRN: [id number]  CC: Hand Pain (Pain in hands/Feet/Flu shot) and Leg Pain  Hand Pain   The incident occurred more than 1 week ago (several months). There was no injury mechanism. The pain is present in the right hand and left hand. The quality of the pain is described as burning. The pain does not radiate. The pain has been intermittent since the incident. Associated symptoms include muscle weakness, numbness and tingling. Pertinent negatives include no chest pain. Nothing aggravates the symptoms. She has tried NSAIDs, acetaminophen and rest for the symptoms.  Leg Pain   Incident onset: over several months. There was no injury mechanism. The pain is present in the right leg, left leg, left knee, right knee, right foot and left foot. The quality of the pain is described as cramping, burning, aching and shooting. The pain is at a severity of 8/10. The pain is severe. The pain has been intermittent since onset. Associated symptoms include muscle weakness, numbness and tingling. Pertinent negatives include no inability to bear weight or loss of motion. She reports no foreign bodies present. Exacerbated by: at night with leg elevation. She has tried acetaminophen, NSAIDs and rest for the symptoms. The treatment provided mild relief.  not using gabapentin as prescribed Denies any neck pain or neck injury in past. No shoulder pain of shoulder injury.  Outpatient Medications Prior to Visit  Medication Sig Dispense Refill  . albuterol (PROAIR HFA) 108 (90 BASE) MCG/ACT inhaler Inhale 2 puffs into the lungs every 6 (six) hours as needed for wheezing or shortness of breath. 1 Inhaler 3  . ALPRAZolam (XANAX) 0.25 MG tablet Take 1 tablet (0.25 mg total) by mouth 2 (two) times daily as needed for anxiety. 30 tablet 1  . budesonide-formoterol (SYMBICORT) 160-4.5 MCG/ACT inhaler Inhale 2 puffs into the lungs 2 (two) times daily.      . Clobetasol Prop Emollient Base (CLOBETASOL PROPIONATE E) 0.05 % emollient cream Apply 1 application topically daily as needed (skin irritations).     . fluticasone (FLONASE) 50 MCG/ACT nasal spray Place 1 spray into both nostrils daily. 16 g 3  . furosemide (LASIX) 40 MG tablet TAKE 1 TABLET (40 MG TOTAL) BY MOUTH DAILY. 90 tablet 3  . furosemide (LASIX) 80 MG tablet     . HYDROcodone-acetaminophen (NORCO) 7.5-325 MG tablet Take 1 tablet by mouth 2 (two) times daily as needed for moderate pain. 60 tablet 0  . labetalol (NORMODYNE) 200 MG tablet TAKE 1 TABLET BY MOUTH TWICE A DAY 180 tablet 1  . lisinopril (PRINIVIL,ZESTRIL) 20 MG tablet Take 1 tablet (20 mg total) by mouth daily. 90 tablet 3  . montelukast (SINGULAIR) 10 MG tablet TAKE 1 TABLET (10 MG TOTAL) BY MOUTH AT BEDTIME. 90 tablet 1  . potassium citrate (UROCIT-K) 10 MEQ (1080 MG) SR tablet TAKE 1 TABLET BY MOUTH EVERY DAY 90 tablet 2  . sitaGLIPtin (JANUVIA) 100 MG tablet Take 1 tablet (100 mg total) by mouth daily. 30 tablet 3  . Suvorexant (BELSOMRA) 10 MG TABS Take 10 mg by mouth at bedtime. 30 tablet 5  . gabapentin (NEURONTIN) 300 MG capsule Take 1 capsule (300 mg total) by mouth 2 (two) times daily as needed. 90 capsule 2   No facility-administered medications prior to visit.     ROS See HPI  Objective:  BP 122/82 (BP Location: Left Arm, Patient Position: Sitting, Cuff Size: Large)  Temp 98 F (36.7 C)   Ht 5\' 8"  (1.727 m)   Wt 285 lb (129.3 kg)   SpO2 98%   BMI 43.33 kg/m   BP Readings from Last 3 Encounters:  11/19/15 122/82  08/24/15 112/72  08/18/15 120/78    Wt Readings from Last 3 Encounters:  11/19/15 285 lb (129.3 kg)  10/07/15 287 lb (130.2 kg)  08/24/15 298 lb 6.4 oz (135.4 kg)    Physical Exam  Constitutional: She is oriented to person, place, and time. No distress.  Cardiovascular: Normal rate.   Pulmonary/Chest: Effort normal.  Musculoskeletal: She exhibits no edema or tenderness.        Cervical back: Normal.       Right foot: Normal.       Left foot: Normal.  Decreased microfilament and vibration sensation in bilateral feet. Normal distal pulses. Mild ankle edema (non pitting)  Neurological: She is alert and oriented to person, place, and time.  Ambulates with walker/  Skin: Skin is warm and dry. No erythema.  Vitals reviewed.   Lab Results  Component Value Date   WBC 5.6 06/09/2015   HGB 11.8 (L) 06/09/2015   HCT 35.0 (L) 06/09/2015   PLT 290.0 06/09/2015   GLUCOSE 103 (H) 06/09/2015   CHOL 214 (H) 05/20/2014   TRIG 101.0 05/20/2014   HDL 71.90 05/20/2014   LDLCALC 122 (H) 05/20/2014   ALT 26 06/09/2015   AST 17 06/09/2015   NA 136 06/09/2015   K 4.3 06/09/2015   CL 99 06/09/2015   CREATININE 1.34 (H) 06/09/2015   BUN 28 (H) 06/09/2015   CO2 27 06/09/2015   TSH 0.35 05/20/2014   INR 1.11 06/16/2014   HGBA1C 6.0 06/09/2015    No results found.  Assessment & Plan:   Vickii was seen today for hand pain and leg pain.  Diagnoses and all orders for this visit:  Pain in both lower extremities -     VAS [name] ABI WITH/WO TBI; Future -     gabapentin (NEURONTIN) 300 MG capsule; Take 1 capsule (300 mg total) by mouth 3 (three) times daily.  Paresthesia of both feet -     VAS [name] ABI WITH/WO TBI; Future -     gabapentin (NEURONTIN) 300 MG capsule; Take 1 capsule (300 mg total) by mouth 3 (three) times daily.  Need for prophylactic vaccination and inoculation against influenza -     Flu Vaccine QUAD 36+ mos IM  Numbness in both hands   I have changed Ms. Galgano's gabapentin. I am also having her maintain her Clobetasol Prop Emollient Base, budesonide-formoterol, albuterol, furosemide, sitaGLIPtin, lisinopril, fluticasone, labetalol, potassium citrate, ALPRAZolam, HYDROcodone-acetaminophen, Suvorexant, montelukast, and furosemide.  Meds ordered this encounter  Medications  . gabapentin (NEURONTIN) 300 MG capsule    Sig: Take 1 capsule (300 mg  total) by mouth 3 (three) times daily.    Dispense:  90 capsule    Refill:  2    Order Specific Question:   Supervising Provider    Answer:   Cassandria Anger [[id number]]    Follow-up: Return if symptoms worsen or fail to improve.  Wilfred Lacy, NP

## 2015-11-19 NOTE — Progress Notes (Unsigned)
Pre visit review using our clinic review tool, if applicable. No additional management support is needed unless otherwise documented below in the visit note. 

## 2015-11-19 NOTE — Assessment & Plan Note (Signed)
Consider referral to neurologist and cymbalta use if ABI normal and gabapentin is ineffective.

## 2015-11-23 DIAGNOSIS — R55 Syncope and collapse: Secondary | ICD-10-CM | POA: Diagnosis not present

## 2015-11-24 ENCOUNTER — Ambulatory Visit (INDEPENDENT_AMBULATORY_CARE_PROVIDER_SITE_OTHER): Payer: Self-pay | Admitting: Orthopaedic Surgery

## 2015-11-24 ENCOUNTER — Telehealth: Payer: Self-pay | Admitting: Internal Medicine

## 2015-11-24 ENCOUNTER — Telehealth: Payer: Self-pay

## 2015-11-24 NOTE — Telephone Encounter (Signed)
Spoke to patient's daughter.

## 2015-11-24 NOTE — Telephone Encounter (Signed)
Please advise patient to go to the hospital today for additional evaluation.

## 2015-11-24 NOTE — Telephone Encounter (Signed)
[facility]  Patient Name: Stacey Page  DOB: 03/19/1950    Initial Comment Caller states she fainted last night, EMT came and checked her, advised her to call PCP for appt, no sx right now, but worried and has questions.   Nurse Assessment  Nurse: Wayne Sever, RN, Tillie Rung Date/Time (Eastern Time): 11/24/2015 8:54:54 AM  Confirm and document reason for call. If symptomatic, describe symptoms. You must click the next button to save text entered. ---Caller states she passed out last night. She states EMS came and checked on her. She felt off and then passed out. Caller's daughter states she was not completely out, but did have some vomiting. She threw up and then she felt better after that. EMS checked BS and it was 130. They checked her BP and 12 lead EKG. Her BP was 108/60 and pulse was 60. She is feeling fine today.  Has the patient traveled out of the country within the last 30 days? ---Not Applicable  Does the patient have any new or worsening symptoms? ---Yes  Will a triage be completed? ---Yes  Related visit to physician within the last 2 weeks? ---No  Does the PT have any chronic conditions? (i.e. diabetes, asthma, etc.) ---Yes  List chronic conditions. ---HTN, Diabetes, CHF, COPD  Is this a behavioral health or substance abuse call? ---No     Guidelines    Guideline Title Affirmed Question Affirmed Notes  Fainting [1] Age > 50 years AND [2] now alert and feels fine    Final Disposition User   Go to ED Now (or PCP triage) Wayne Sever, RN, Tillie Rung    Comments  Checked the office and no appointments left today. They were unsure about ER despite symptoms last night. Checked [facility] office also and no appointments left within the 4 hour time frame. Advised them to please seek care at ER, but they were not sure if they would or not.   Referrals  GO TO FACILITY UNDECIDED   Disagree/Comply: Comply

## 2015-11-24 NOTE — Telephone Encounter (Signed)
Please advise patient passed out last night. I have scheduled her an appointment for next Tuesday with you. Patient would like for you to call her.

## 2015-11-30 ENCOUNTER — Ambulatory Visit: Payer: PPO | Admitting: Family

## 2015-12-01 ENCOUNTER — Ambulatory Visit: Payer: PPO | Admitting: Nurse Practitioner

## 2015-12-03 ENCOUNTER — Other Ambulatory Visit (INDEPENDENT_AMBULATORY_CARE_PROVIDER_SITE_OTHER): Payer: PPO

## 2015-12-03 ENCOUNTER — Ambulatory Visit (INDEPENDENT_AMBULATORY_CARE_PROVIDER_SITE_OTHER): Payer: PPO | Admitting: Nurse Practitioner

## 2015-12-03 ENCOUNTER — Encounter: Payer: Self-pay | Admitting: Nurse Practitioner

## 2015-12-03 VITALS — BP 128/76 | HR 84 | Temp 97.4°F | Ht 68.0 in | Wt 287.0 lb

## 2015-12-03 DIAGNOSIS — R55 Syncope and collapse: Secondary | ICD-10-CM

## 2015-12-03 LAB — BASIC METABOLIC PANEL
BUN: 30 mg/dL — ABNORMAL HIGH (ref 6–23)
CO2: 26 meq/L (ref 19–32)
Calcium: 10.5 mg/dL (ref 8.4–10.5)
Chloride: 106 mEq/L (ref 96–112)
Creatinine, Ser: 1.34 mg/dL — ABNORMAL HIGH (ref 0.40–1.20)
GFR: 51.21 mL/min — ABNORMAL LOW (ref >60.00)
GLUCOSE: 93 mg/dL (ref 70–99)
POTASSIUM: 4.4 meq/L (ref 3.5–5.1)
SODIUM: 140 meq/L (ref 135–145)

## 2015-12-03 LAB — POCT URINALYSIS DIPSTICK
Bilirubin, UA: NEGATIVE
Blood, UA: NEGATIVE
GLUCOSE UA: NEGATIVE
Ketones, UA: NEGATIVE
Leukocytes, UA: NEGATIVE
NITRITE UA: NEGATIVE
Spec Grav, UA: >=1.03
UROBILINOGEN UA: 0.2
pH, UA: 5.5

## 2015-12-03 NOTE — Progress Notes (Signed)
Pre visit review using our clinic review tool, if applicable. No additional management support is needed unless otherwise documented below in the visit note. 

## 2015-12-03 NOTE — Patient Instructions (Addendum)
Syncopal episodes possibly related to postprandial vasovagal syndrome. Will repeat BMP, order carotid Doppler and echocardiogram to rule out other possible causes.  Encourage adequate oral hydration. Change positions slowly. You will be called with appointment for carotid Doppler and echocardiogram. Go to hospital if symptoms recorded again.  Syncope, commonly known as fainting, is a temporary loss of consciousness. It occurs when the blood flow to the brain is reduced. Vasovagal syncope (also called neurocardiogenic syncope) is a fainting spell in which the blood flow to the brain is reduced because of a sudden drop in heart rate and blood pressure. Vasovagal syncope occurs when the brain and the cardiovascular system (blood vessels) do not adequately communicate and respond to each other. This is the most common cause of fainting. It often occurs in response to fear or some other type of emotional or physical stress. The body has a reaction in which the heart starts beating too slowly or the blood vessels expand, reducing blood pressure. This type of fainting spell is generally considered harmless. However, injuries can occur if a person takes a sudden fall during a fainting spell.  CAUSES  Vasovagal syncope occurs when a person's blood pressure and heart rate decrease suddenly, usually in response to a trigger. Many things and situations can trigger an episode. Some of these include:   Pain.   Fear.   The sight of blood or medical procedures, such as blood being drawn from a vein.   Common activities, such as coughing, swallowing, stretching, or going to the bathroom.   Emotional stress.   Prolonged standing, especially in a warm environment.   Lack of sleep or rest.   Prolonged lack of food.   Prolonged lack of fluids.   Recent illness.  The use of certain drugs that affect blood pressure, such as cocaine, alcohol, marijuana, inhalants, and opiates.  SYMPTOMS  Before  the fainting episode, you may:   Feel dizzy or light headed.   Become pale.  Sense that you are going to faint.   Feel like the room is spinning.   Have tunnel vision, only seeing directly in front of you.   Feel sick to your stomach (nauseous).   See spots or slowly lose vision.   Hear ringing in your ears.   Have a headache.   Feel warm and sweaty.   Feel a sensation of pins and needles. During the fainting spell, you will generally be unconscious for no longer than a couple minutes before waking up and returning to normal. If you get up too quickly before your body can recover, you may faint again. Some twitching or jerky movements may occur during the fainting spell.  DIAGNOSIS  Your health care provider will ask about your symptoms, take a medical history, and perform a physical exam. Various tests may be done to rule out other causes of fainting. These may include blood tests and tests to check the heart, such as electrocardiography, echocardiography, and possibly an electrophysiology study. When other causes have been ruled out, a test may be done to check the body's response to changes in position (tilt table test). TREATMENT  Most cases of vasovagal syncope do not require treatment. Your health care provider may recommend ways to avoid fainting triggers and may provide home strategies for preventing fainting. If you must be exposed to a possible trigger, you can drink additional fluids to help reduce your chances of having an episode of vasovagal syncope. If you have warning signs of an oncoming episode,  you can respond by positioning yourself favorably (lying down). If your fainting spells continue, you may be given medicines to prevent fainting. Some medicines may help make you more resistant to repeated episodes of vasovagal syncope. Special exercises or compression stockings may be recommended. In rare cases, the surgical placement of a pacemaker is considered. HOME  CARE INSTRUCTIONS   Learn to identify the warning signs of vasovagal syncope.   Sit or lie down at the first warning sign of a fainting spell. If sitting, put your head down between your legs. If you lie down, swing your legs up in the air to increase blood flow to the brain.   Avoid hot tubs and saunas.  Avoid prolonged standing.  Drink enough fluids to keep your urine clear or pale yellow. Avoid caffeine.  Increase salt in your diet as directed by your health care provider.   If you have to stand for a long time, perform movements such as:   Crossing your legs.   Flexing and stretching your leg muscles.   Squatting.   Moving your legs.   Bending over.   Only take over-the-counter or prescription medicines as directed by your health care provider. Do not suddenly stop any medicines without asking your health care provider first. Teller IF:   Your fainting spells continue or happen more frequently in spite of treatment.   You lose consciousness for more than a couple minutes.  You have fainting spells during or after exercising or after being startled.   You have new symptoms that occur with the fainting spells, such as:   Shortness of breath.  Chest pain.   Irregular heartbeat.   You have episodes of twitching or jerky movements that last longer than a few seconds.  You have episodes of twitching or jerky movements without obvious fainting. SEEK IMMEDIATE MEDICAL CARE IF:   You have injuries or bleeding after a fainting spell.   You have episodes of twitching or jerky movements that last longer than 5 minutes.   You have more than one spell of twitching or jerky movements before returning to consciousness after fainting.   This information is not intended to replace advice given to you by your health care provider. Make sure you discuss any questions you have with your health care provider.   Document Released: 01/11/2012 Document  Revised: 06/10/2014 Document Reviewed: 01/11/2012 Elsevier Interactive Patient Education [employer].

## 2015-12-03 NOTE — Progress Notes (Signed)
Normal results, see office note

## 2015-12-03 NOTE — Progress Notes (Signed)
Subjective:  Patient ID: Stacey Page, female    DOB: November 11, 1950  Age: 65 y.o. MRN: [id number]  CC: Loss of Consciousness (Pt  stated saw EMT  for loss of conciouss on 11-23-2015)  Loss of Consciousness  This is a recurrent problem. The current episode started more than 1 month ago. The problem occurs intermittently (Had 2 episodes of near syncope in the past,, on October 17 had syncopal episode. Weakness by daughter.). The problem has been resolved. Length of episode of loss of consciousness: Unsure how long she was unconscious. Exacerbated by: 3 episodes have been associated with eating. Associated symptoms include light-headedness and vomiting. Pertinent negatives include no abdominal pain, auditory change, aura, bladder incontinence, bowel incontinence, chest pain, clumsiness, confusion, diaphoresis, dizziness, fever, focal sensory loss, focal weakness, headaches, malaise/fatigue, nausea, palpitations, slurred speech, vertigo, visual change or weakness. Her past medical history is significant for DM and HTN. There is no history of arrhythmia, CAD, a clotting disorder, CVA, seizures, a sudden death in family, TIA or vertigo.  She was evaluated by EMS personnel while at [facility]. EKG done (normal). Patient declined to go to hospital for further evaluation at that time. She denies any repeat syncopal episode since October 17.  Outpatient Medications Prior to Visit  Medication Sig Dispense Refill  . albuterol (PROAIR HFA) 108 (90 BASE) MCG/ACT inhaler Inhale 2 puffs into the lungs every 6 (six) hours as needed for wheezing or shortness of breath. 1 Inhaler 3  . ALPRAZolam (XANAX) 0.25 MG tablet Take 1 tablet (0.25 mg total) by mouth 2 (two) times daily as needed for anxiety. 30 tablet 1  . budesonide-formoterol (SYMBICORT) 160-4.5 MCG/ACT inhaler Inhale 2 puffs into the lungs 2 (two) times daily.    . Clobetasol Prop Emollient Base (CLOBETASOL PROPIONATE E) 0.05 % emollient cream Apply 1  application topically daily as needed (skin irritations).     . fluticasone (FLONASE) 50 MCG/ACT nasal spray Place 1 spray into both nostrils daily. 16 g 3  . furosemide (LASIX) 40 MG tablet TAKE 1 TABLET (40 MG TOTAL) BY MOUTH DAILY. 90 tablet 3  . gabapentin (NEURONTIN) 300 MG capsule Take 1 capsule (300 mg total) by mouth 3 (three) times daily. 90 capsule 2  . HYDROcodone-acetaminophen (NORCO) 7.5-325 MG tablet Take 1 tablet by mouth 2 (two) times daily as needed for moderate pain. 60 tablet 0  . labetalol (NORMODYNE) 200 MG tablet TAKE 1 TABLET BY MOUTH TWICE A DAY 180 tablet 1  . lisinopril (PRINIVIL,ZESTRIL) 20 MG tablet Take 1 tablet (20 mg total) by mouth daily. 90 tablet 3  . montelukast (SINGULAIR) 10 MG tablet TAKE 1 TABLET (10 MG TOTAL) BY MOUTH AT BEDTIME. 90 tablet 1  . potassium citrate (UROCIT-K) 10 MEQ (1080 MG) SR tablet TAKE 1 TABLET BY MOUTH EVERY DAY 90 tablet 2  . sitaGLIPtin (JANUVIA) 100 MG tablet Take 1 tablet (100 mg total) by mouth daily. 30 tablet 3  . Suvorexant (BELSOMRA) 10 MG TABS Take 10 mg by mouth at bedtime. 30 tablet 5  . furosemide (LASIX) 80 MG tablet      No facility-administered medications prior to visit.     ROS See HPI  Objective:  BP 128/76 (BP Location: Right Arm, Patient Position: Standing, Cuff Size: Large)   Pulse 84   Temp 97.4 F (36.3 C)   Ht 5\' 8"  (1.727 m)   Wt 287 lb (130.2 kg)   SpO2 98%   BMI 43.64 kg/m   BP Readings from Last  3 Encounters:  12/03/15 128/76  11/19/15 122/82  08/24/15 112/72    Wt Readings from Last 3 Encounters:  12/03/15 287 lb (130.2 kg)  11/19/15 285 lb (129.3 kg)  10/07/15 287 lb (130.2 kg)    Physical Exam  Constitutional: She is oriented to person, place, and time. No distress.  HENT:  Right Ear: External ear normal.  Left Ear: External ear normal.  Nose: Nose normal.  Mouth/Throat: Oropharynx is clear and moist. No oropharyngeal exudate.  Eyes: Conjunctivae and EOM are normal. Pupils are  equal, round, and reactive to light. No scleral icterus.  Neck: Normal range of motion. Neck supple.  Cardiovascular: Normal rate, regular rhythm and normal heart sounds.   Pulmonary/Chest: Effort normal and breath sounds normal.  Abdominal: Soft. Bowel sounds are normal.  Musculoskeletal: Normal range of motion. She exhibits no edema.  Lymphadenopathy:    She has no cervical adenopathy.  Neurological: She is alert and oriented to person, place, and time. No cranial nerve deficit. Coordination normal.  Skin: Skin is warm and dry.  Psychiatric: She has a normal mood and affect. Her behavior is normal.  Vitals reviewed.   Lab Results  Component Value Date   WBC 5.6 06/09/2015   HGB 11.8 (L) 06/09/2015   HCT 35.0 (L) 06/09/2015   PLT 290.0 06/09/2015   GLUCOSE 103 (H) 06/09/2015   CHOL 214 (H) 05/20/2014   TRIG 101.0 05/20/2014   HDL 71.90 05/20/2014   LDLCALC 122 (H) 05/20/2014   ALT 26 06/09/2015   AST 17 06/09/2015   NA 136 06/09/2015   K 4.3 06/09/2015   CL 99 06/09/2015   CREATININE 1.34 (H) 06/09/2015   BUN 28 (H) 06/09/2015   CO2 27 06/09/2015   TSH 0.35 05/20/2014   INR 1.11 06/16/2014   HGBA1C 6.0 06/09/2015    Mm Digital Screening Bilateral  Result Date: 12/01/2015 CLINICAL DATA:  Screening. EXAM: DIGITAL SCREENING BILATERAL MAMMOGRAM WITH CAD COMPARISON:  Previous exam(s). ACR Breast Density Category b: There are scattered areas of fibroglandular density. FINDINGS: There are no findings suspicious for malignancy. Images were processed with CAD. IMPRESSION: No mammographic evidence of malignancy. A result letter of this screening mammogram will be mailed directly to the patient. RECOMMENDATION: Screening mammogram in one year. (Code:SM-B-01Y) BI-RADS CATEGORY  1: Negative. Electronically Signed   By: Claudie Revering M.D.   On: 12/01/2015 08:04    Assessment & Plan:   Stacey Page was seen today for loss of consciousness.  Diagnoses and all orders for this  visit:  Syncope, unspecified syncope type -     POCT urinalysis dipstick -     Orthostatic vital signs -     Basic Metabolic Panel (BMET); Future -     VAS US CAROTID; Future -     ECHOCARDIOGRAM COMPLETE; Future   I am having Ms. Dabney maintain her Clobetasol Prop Emollient Base, budesonide-formoterol, albuterol, furosemide, sitaGLIPtin, lisinopril, fluticasone, labetalol, potassium citrate, ALPRAZolam, HYDROcodone-acetaminophen, Suvorexant, montelukast, and gabapentin.  No orders of the defined types were placed in this encounter.  BMP Latest Ref Rng & Units 12/03/2015 06/09/2015 03/19/2015  Glucose 70 - 99 mg/dL 93 103(H) 69  BUN 6 - 23 mg/dL 30(H) 28(H) 18  Creatinine 0.40 - 1.20 mg/dL 1.34(H) 1.34(H) 1.36(H)  Sodium 135 - 145 mEq/L 140 136 141  Potassium 3.5 - 5.1 mEq/L 4.4 4.3 4.4  Chloride 96 - 112 mEq/L 106 99 104  CO2 19 - 32 mEq/L 26 27 26   Calcium 8.4 - 10.5  mg/dL 10.5 10.7(H) 9.6   Follow-up: No Follow-up on file.  Wilfred Lacy, NP

## 2015-12-04 ENCOUNTER — Encounter (HOSPITAL_COMMUNITY): Payer: PPO

## 2015-12-07 ENCOUNTER — Ambulatory Visit (HOSPITAL_COMMUNITY)
Admission: RE | Admit: 2015-12-07 | Discharge: 2015-12-07 | Disposition: A | Discharge status: Home / Self Care | Payer: PPO | Source: Ambulatory Visit | Attending: Nurse Practitioner | Admitting: Nurse Practitioner

## 2015-12-07 ENCOUNTER — Ambulatory Visit (HOSPITAL_BASED_OUTPATIENT_CLINIC_OR_DEPARTMENT_OTHER)
Admission: RE | Admit: 2015-12-07 | Discharge: 2015-12-07 | Disposition: A | Discharge status: Home / Self Care | Payer: PPO | Source: Ambulatory Visit | Attending: Nurse Practitioner | Admitting: Nurse Practitioner

## 2015-12-07 DIAGNOSIS — M79604 Pain in right leg: Secondary | ICD-10-CM | POA: Insufficient documentation

## 2015-12-07 DIAGNOSIS — M79605 Pain in left leg: Secondary | ICD-10-CM | POA: Insufficient documentation

## 2015-12-07 DIAGNOSIS — R55 Syncope and collapse: Secondary | ICD-10-CM | POA: Insufficient documentation

## 2015-12-07 DIAGNOSIS — R202 Paresthesia of skin: Secondary | ICD-10-CM | POA: Diagnosis not present

## 2015-12-07 DIAGNOSIS — Z87891 Personal history of nicotine dependence: Secondary | ICD-10-CM | POA: Insufficient documentation

## 2015-12-07 DIAGNOSIS — I6523 Occlusion and stenosis of bilateral carotid arteries: Secondary | ICD-10-CM | POA: Diagnosis not present

## 2015-12-07 DIAGNOSIS — G2581 Restless legs syndrome: Secondary | ICD-10-CM | POA: Diagnosis not present

## 2015-12-07 LAB — VAS US CAROTID
LCCAPSYS: 128 cm/s
LEFT ECA DIAS: -11 cm/s
LEFT VERTEBRAL DIAS: 19 cm/s
LICADSYS: -105 cm/s
Left CCA dist dias: -26 cm/s
Left CCA dist sys: -92 cm/s
Left CCA prox dias: 24 cm/s
Left ICA dist dias: -40 cm/s
Left ICA prox dias: -23 cm/s
Left ICA prox sys: -98 cm/s
RCCADSYS: -60 cm/s
RCCAPDIAS: 15 cm/s
RIGHT ECA DIAS: -15 cm/s
RIGHT VERTEBRAL DIAS: 12 cm/s
Right CCA prox sys: 90 cm/s

## 2015-12-07 NOTE — Progress Notes (Signed)
VASCULAR LAB PRELIMINARY  ARTERIAL  ABI completed: ABIs appeared normal at rest. TBIs appeared normal at rest.     RIGHT    LEFT    PRESSURE WAVEFORM  PRESSURE WAVEFORM  BRACHIAL 162 Triphasic BRACHIAL 155 Triphasic  DP 162 Triphasic DP 164 Triphasic  PT 153 Biphasic PT 169 Triphasic  GREAT TOE 0.80 NA GREAT TOE 0.71 NA    RIGHT LEFT  ABI 1.0 1.0     Breyer Tejera D, RVT 12/07/2015, 10:06 AM

## 2015-12-07 NOTE — Progress Notes (Signed)
Preliminary results by tech - Carotid Duplex Completed. Bilateral carotid arteries demonstrated a mild amount of heterogeneous plaque with a 1-39% stenosis. Vertebral arteries demonstrated antegrade flow.  Oda Cogan, BS, RDMS, RVT

## 2015-12-08 ENCOUNTER — Ambulatory Visit (INDEPENDENT_AMBULATORY_CARE_PROVIDER_SITE_OTHER): Payer: PPO | Admitting: Podiatry

## 2015-12-08 ENCOUNTER — Encounter: Payer: Self-pay | Admitting: Podiatry

## 2015-12-08 VITALS — BP 104/40 | HR 63 | Resp 16 | Ht 68.0 in | Wt 287.0 lb

## 2015-12-08 DIAGNOSIS — B351 Tinea unguium: Secondary | ICD-10-CM | POA: Diagnosis not present

## 2015-12-08 DIAGNOSIS — M79676 Pain in unspecified toe(s): Secondary | ICD-10-CM

## 2015-12-08 NOTE — Progress Notes (Signed)
   Subjective:    Patient ID: Stacey Page, female    DOB: August 09, 1950, 65 y.o.   MRN: [id number]  HPI this patient presents the office for evaluation and treatment of her diabetic feet. She says that she has had pain in her toes on both feet. She says that she was referred to this office by her medical doctor. She is been diabetic for 7 years and is taken. Januvia and gabapentin. She presents the office today for an evaluation of her feet and treatment of her long painful nails    Review of Systems  All other systems reviewed and are negative.      Objective:   Physical Exam GENERAL APPEARANCE: Alert, conversant. Appropriately groomed. No acute distress.  VASCULAR: Pedal pulses are weakly   palpable at  [facility] and PT bilateral.  Capillary refill time is immediate to all digits,  Normal temperature gradient.    NEUROLOGIC: sensation is normal to 5.07 monofilament at 5/5 sites bilateral.  Light touch is intact bilateral, Muscle strength normal.  MUSCULOSKELETAL: acceptable muscle strength, tone and stability bilateral.  Intrinsic muscluature intact bilateral.  Rectus appearance of foot and digits noted bilateral.   DERMATOLOGIC: skin color, texture, and turgor are within normal limits.  No preulcerative lesions or ulcers  are seen, no interdigital maceration noted.  No open lesions present.   No drainage noted.  NAILS  Thick disfigured discolored nails both feet         Assessment & Plan:  Pain due to onychomycosis  Diabetes with neuropathy   IE  Debridement of nails.  RTC 3 months   Gardiner Barefoot DPM

## 2015-12-09 ENCOUNTER — Ambulatory Visit (HOSPITAL_COMMUNITY)
Admission: RE | Admit: 2015-12-09 | Discharge: 2015-12-09 | Disposition: A | Discharge status: Home / Self Care | Payer: PPO | Source: Ambulatory Visit | Attending: Nurse Practitioner | Admitting: Nurse Practitioner

## 2015-12-09 DIAGNOSIS — J449 Chronic obstructive pulmonary disease, unspecified: Secondary | ICD-10-CM | POA: Insufficient documentation

## 2015-12-09 DIAGNOSIS — R55 Syncope and collapse: Secondary | ICD-10-CM

## 2015-12-09 DIAGNOSIS — E119 Type 2 diabetes mellitus without complications: Secondary | ICD-10-CM | POA: Insufficient documentation

## 2015-12-09 DIAGNOSIS — I10 Essential (primary) hypertension: Secondary | ICD-10-CM | POA: Insufficient documentation

## 2015-12-09 NOTE — Progress Notes (Signed)
  Echocardiogram 2D Echocardiogram has been performed.  [name] Dubin 12/09/2015, 1:57 PM

## 2015-12-15 ENCOUNTER — Encounter: Payer: Self-pay | Admitting: Nurse Practitioner

## 2015-12-15 ENCOUNTER — Other Ambulatory Visit (INDEPENDENT_AMBULATORY_CARE_PROVIDER_SITE_OTHER): Payer: PPO

## 2015-12-15 ENCOUNTER — Ambulatory Visit (INDEPENDENT_AMBULATORY_CARE_PROVIDER_SITE_OTHER): Payer: PPO | Admitting: Nurse Practitioner

## 2015-12-15 VITALS — BP 126/86 | HR 86 | Temp 97.6°F | Ht 68.0 in | Wt 287.0 lb

## 2015-12-15 DIAGNOSIS — T380X5A Adverse effect of glucocorticoids and synthetic analogues, initial encounter: Secondary | ICD-10-CM

## 2015-12-15 DIAGNOSIS — M79605 Pain in left leg: Secondary | ICD-10-CM

## 2015-12-15 DIAGNOSIS — R202 Paresthesia of skin: Secondary | ICD-10-CM

## 2015-12-15 DIAGNOSIS — R55 Syncope and collapse: Secondary | ICD-10-CM

## 2015-12-15 DIAGNOSIS — Z0000 Encounter for general adult medical examination without abnormal findings: Secondary | ICD-10-CM

## 2015-12-15 DIAGNOSIS — I10 Essential (primary) hypertension: Secondary | ICD-10-CM | POA: Diagnosis not present

## 2015-12-15 DIAGNOSIS — E099 Drug or chemical induced diabetes mellitus without complications: Secondary | ICD-10-CM

## 2015-12-15 DIAGNOSIS — E785 Hyperlipidemia, unspecified: Secondary | ICD-10-CM

## 2015-12-15 DIAGNOSIS — D649 Anemia, unspecified: Secondary | ICD-10-CM | POA: Diagnosis not present

## 2015-12-15 DIAGNOSIS — I5032 Chronic diastolic (congestive) heart failure: Secondary | ICD-10-CM

## 2015-12-15 DIAGNOSIS — M79604 Pain in right leg: Secondary | ICD-10-CM

## 2015-12-15 LAB — CBC WITH DIFFERENTIAL/PLATELET
BASOS PCT: 0.7 % (ref 0.0–3.0)
Basophils Absolute: 0 10*3/uL (ref 0.0–0.1)
Eosinophils Absolute: 0.1 10*3/uL (ref 0.0–0.7)
Eosinophils Relative: 2.7 % (ref 0.0–5.0)
HEMATOCRIT: 37.5 % (ref 36.0–46.0)
HEMOGLOBIN: 12.5 g/dL (ref 12.0–15.0)
LYMPHS PCT: 31.9 % (ref 12.0–46.0)
Lymphs Abs: 1.7 10*3/uL (ref 0.7–4.0)
MCHC: 33.3 g/dL (ref 30.0–36.0)
MCV: 85.5 fl (ref 78.0–100.0)
MONOS PCT: 7.3 % (ref 3.0–12.0)
Monocytes Absolute: 0.4 10*3/uL (ref 0.1–1.0)
NEUTROS ABS: 3 10*3/uL (ref 1.4–7.7)
Neutrophils Relative %: 57.4 % (ref 43.0–77.0)
PLATELETS: 259 10*3/uL (ref 150.0–400.0)
RBC: 4.39 Mil/uL (ref 3.87–5.11)
RDW: 16.7 % — AB (ref 11.5–15.5)
WBC: 5.3 10*3/uL (ref 4.0–10.5)

## 2015-12-15 LAB — HEPATIC FUNCTION PANEL
ALK PHOS: 67 U/L (ref 39–117)
ALT: 9 U/L (ref 0–35)
AST: 12 U/L (ref 0–37)
Albumin: 4.4 g/dL (ref 3.5–5.2)
BILIRUBIN DIRECT: 0.1 mg/dL (ref 0.0–0.3)
BILIRUBIN TOTAL: 0.5 mg/dL (ref 0.2–1.2)
Total Protein: 8.5 g/dL — ABNORMAL HIGH (ref 6.0–8.3)

## 2015-12-15 LAB — LIPID PANEL
CHOL/HDL RATIO: 3
Cholesterol: 208 mg/dL — ABNORMAL HIGH (ref 0–200)
HDL: 61 mg/dL (ref >39.00)
LDL CALC: 130 mg/dL — AB (ref 0–99)
NONHDL: 147.38
Triglycerides: 88 mg/dL (ref 0.0–149.0)
VLDL: 17.6 mg/dL (ref 0.0–40.0)

## 2015-12-15 LAB — HEMOGLOBIN A1C: Hgb A1c MFr Bld: 5.6 % (ref 4.6–6.5)

## 2015-12-15 LAB — TSH: TSH: 0.48 u[IU]/mL (ref 0.35–4.50)

## 2015-12-15 MED ORDER — SITAGLIPTIN PHOSPHATE 100 MG PO TABS: 100.0000 mg | ORAL_TABLET | Freq: Every day | ORAL | 3 refills | Status: DC

## 2015-12-15 MED ORDER — POTASSIUM CITRATE ER 10 MEQ (1080 MG) PO TBCR: 10.0000 meq | EXTENDED_RELEASE_TABLET | Freq: Every day | ORAL | 1 refills | Status: DC

## 2015-12-15 MED ORDER — LISINOPRIL 20 MG PO TABS: 20.0000 mg | ORAL_TABLET | Freq: Every day | ORAL | 1 refills | Status: DC

## 2015-12-15 MED ORDER — CLOBETASOL PROP EMOLLIENT BASE 0.05 % EX CREA: 1.0000 "application " | TOPICAL_CREAM | Freq: Every day | CUTANEOUS | 2 refills | Status: DC | PRN

## 2015-12-15 MED ORDER — LABETALOL HCL 200 MG PO TABS: 200.0000 mg | ORAL_TABLET | Freq: Two times a day (BID) | ORAL | 1 refills | Status: DC

## 2015-12-15 MED ORDER — FUROSEMIDE 40 MG PO TABS: ORAL_TABLET | ORAL | 1 refills | Status: DC

## 2015-12-15 MED ORDER — GABAPENTIN 300 MG PO CAPS: 300.0000 mg | ORAL_CAPSULE | Freq: Three times a day (TID) | ORAL | 3 refills | Status: DC

## 2015-12-15 MED ORDER — ATORVASTATIN CALCIUM 20 MG PO TABS
20.0000 mg | ORAL_TABLET | Freq: Every day | ORAL | 1 refills | Status: DC
Start: 2015-12-15 — End: 2016-04-14

## 2015-12-15 NOTE — Progress Notes (Signed)
Pre visit review using our clinic review tool, if applicable. No additional management support is needed unless otherwise documented below in the visit note. 

## 2015-12-15 NOTE — Progress Notes (Signed)
Subjective:  Patient ID: Stacey Page, female    DOB: 1951-01-09  Age: 65 y.o. MRN: [id number]  CC: Annual Exam (without pap)   HPI She declined a pelvic exam.  She denies any acute complaint.  Syncope: Denies any recurrent of symptoms since last Ov. She is requesting referral to neurologist for additional evaluation.  Dm: denies any hypoglycemic episodes. Foot exam done by [facility]. Eye exam not done, no dental exam done.  HTN: controlled with lisinopril, labetalol and lasix  CHF: denies any weight gain, no edema, no increased SOB.  Outpatient Medications Prior to Visit  Medication Sig Dispense Refill  . albuterol (PROAIR HFA) 108 (90 BASE) MCG/ACT inhaler Inhale 2 puffs into the lungs every 6 (six) hours as needed for wheezing or shortness of breath. 1 Inhaler 3  . ALPRAZolam (XANAX) 0.25 MG tablet Take 1 tablet (0.25 mg total) by mouth 2 (two) times daily as needed for anxiety. 30 tablet 1  . budesonide-formoterol (SYMBICORT) 160-4.5 MCG/ACT inhaler Inhale 2 puffs into the lungs 2 (two) times daily.    . fluticasone (FLONASE) 50 MCG/ACT nasal spray Place 1 spray into both nostrils daily. 16 g 3  . HYDROcodone-acetaminophen (NORCO) 7.5-325 MG tablet Take 1 tablet by mouth 2 (two) times daily as needed for moderate pain. 60 tablet 0  . montelukast (SINGULAIR) 10 MG tablet TAKE 1 TABLET (10 MG TOTAL) BY MOUTH AT BEDTIME. 90 tablet 1  . Suvorexant (BELSOMRA) 10 MG TABS Take 10 mg by mouth at bedtime. 30 tablet 5  . Clobetasol Prop Emollient Base (CLOBETASOL PROPIONATE E) 0.05 % emollient cream Apply 1 application topically daily as needed (skin irritations).     . furosemide (LASIX) 40 MG tablet TAKE 1 TABLET (40 MG TOTAL) BY MOUTH DAILY. 90 tablet 3  . gabapentin (NEURONTIN) 300 MG capsule Take 1 capsule (300 mg total) by mouth 3 (three) times daily. 90 capsule 2  . labetalol (NORMODYNE) 200 MG tablet TAKE 1 TABLET BY MOUTH TWICE A DAY 180 tablet 1  . lisinopril  (PRINIVIL,ZESTRIL) 20 MG tablet Take 1 tablet (20 mg total) by mouth daily. 90 tablet 3  . potassium citrate (UROCIT-K) 10 MEQ (1080 MG) SR tablet TAKE 1 TABLET BY MOUTH EVERY DAY 90 tablet 2  . sitaGLIPtin (JANUVIA) 100 MG tablet Take 1 tablet (100 mg total) by mouth daily. 30 tablet 3   No facility-administered medications prior to visit.     ROS See HPI  Objective:  BP 126/86 (BP Location: Left Arm, Patient Position: Sitting, Cuff Size: Normal)   Pulse 86   Temp 97.6 F (36.4 C)   Ht 5\' 8"  (1.727 m)   Wt 287 lb (130.2 kg)   SpO2 98%   BMI 43.64 kg/m   BP Readings from Last 3 Encounters:  12/15/15 126/86  12/08/15 (!) 104/40  12/03/15 128/76    Wt Readings from Last 3 Encounters:  12/15/15 287 lb (130.2 kg)  12/08/15 287 lb (130.2 kg)  12/03/15 287 lb (130.2 kg)    Physical Exam  Constitutional: She is oriented to person, place, and time. No distress.  HENT:  Right Ear: External ear normal.  Left Ear: External ear normal.  Nose: Nose normal.  Mouth/Throat: Oropharynx is clear and moist. No oropharyngeal exudate.  Eyes: No scleral icterus.  Neck: Normal range of motion. Neck supple.  Cardiovascular: Normal rate, regular rhythm and normal heart sounds.   Pulmonary/Chest: Effort normal and breath sounds normal. No respiratory distress.  Abdominal: Soft.  She exhibits no distension.  Genitourinary:  Genitourinary Comments: Patient declined  Musculoskeletal: Normal range of motion. She exhibits no edema.  Lymphadenopathy:    She has no cervical adenopathy.  Neurological: She is alert and oriented to person, place, and time.  Skin: Skin is warm and dry.  Psychiatric: She has a normal mood and affect.  Vitals reviewed.   Lab Results  Component Value Date   WBC 5.3 12/15/2015   HGB 12.5 12/15/2015   HCT 37.5 12/15/2015   PLT 259.0 12/15/2015   GLUCOSE 93 12/03/2015   CHOL 208 (H) 12/15/2015   TRIG 88.0 12/15/2015   HDL 61.00 12/15/2015   LDLCALC 130 (H)  12/15/2015   ALT 9 12/15/2015   AST 12 12/15/2015   NA 140 12/03/2015   K 4.4 12/03/2015   CL 106 12/03/2015   CREATININE 1.34 (H) 12/03/2015   BUN 30 (H) 12/03/2015   CO2 26 12/03/2015   TSH 0.48 12/15/2015   INR 1.11 06/16/2014   HGBA1C 5.6 12/15/2015    No results found.  Assessment & Plan:   Stacey Page was seen today for annual exam.  Diagnoses and all orders for this visit:  Encounter for preventative adult health care examination -     Lipid panel; Future -     Hepatitis C Antibody; Future  Essential hypertension -     potassium citrate (UROCIT-K) 10 MEQ (1080 MG) SR tablet; Take 1 tablet (10 mEq total) by mouth daily. -     lisinopril (PRINIVIL,ZESTRIL) 20 MG tablet; Take 1 tablet (20 mg total) by mouth daily. -     furosemide (LASIX) 40 MG tablet; TAKE 1 TABLET (40 MG TOTAL) BY MOUTH DAILY. -     labetalol (NORMODYNE) 200 MG tablet; Take 1 tablet (200 mg total) by mouth 2 (two) times daily.  Controlled steroid-induced diabetes mellitus ([place]) -     Discontinue: sitaGLIPtin (JANUVIA) 100 MG tablet; Take 1 tablet (100 mg total) by mouth daily. -     Hepatic function panel; Future -     Hemoglobin A1c; Future -     sitaGLIPtin (JANUVIA) 100 MG tablet; Take 1 tablet (100 mg total) by mouth daily.  Morbid obesity ([place]) -     TSH; Future  Syncope, unspecified syncope type -     Ambulatory referral to Neurology  Anemia, unspecified type -     CBC w/Diff; Future  Paresthesia of both feet -     gabapentin (NEURONTIN) 300 MG capsule; Take 1 capsule (300 mg total) by mouth 3 (three) times daily.  Pain in both lower extremities -     gabapentin (NEURONTIN) 300 MG capsule; Take 1 capsule (300 mg total) by mouth 3 (three) times daily.  Chronic diastolic heart failure (HCC)  Hyperlipidemia LDL goal <70 -     atorvastatin (LIPITOR) 20 MG tablet; Take 1 tablet (20 mg total) by mouth daily at 6 PM.  Other orders -     Clobetasol Prop Emollient Base (CLOBETASOL  PROPIONATE E) 0.05 % emollient cream; Apply 1 application topically daily as needed (skin irritations).   I have changed Stacey Page's potassium citrate and labetalol. I am also having her start on atorvastatin. Additionally, I am having her maintain her budesonide-formoterol, albuterol, fluticasone, ALPRAZolam, HYDROcodone-acetaminophen, Suvorexant, montelukast, lisinopril, Clobetasol Prop Emollient Base, furosemide, sitaGLIPtin, and gabapentin.  Meds ordered this encounter  Medications  . DISCONTD: sitaGLIPtin (JANUVIA) 100 MG tablet    Sig: Take 1 tablet (100 mg total) by mouth daily.  Dispense:  30 tablet    Refill:  3    Order Specific Question:   Supervising Provider    Answer:   Cassandria Anger [[id number]]  . potassium citrate (UROCIT-K) 10 MEQ (1080 MG) SR tablet    Sig: Take 1 tablet (10 mEq total) by mouth daily.    Dispense:  90 tablet    Refill:  1    Order Specific Question:   Supervising Provider    Answer:   Cassandria Anger [[id number]]  . lisinopril (PRINIVIL,ZESTRIL) 20 MG tablet    Sig: Take 1 tablet (20 mg total) by mouth daily.    Dispense:  90 tablet    Refill:  1    Order Specific Question:   Supervising Provider    Answer:   Cassandria Anger [[id number]]  . Clobetasol Prop Emollient Base (CLOBETASOL PROPIONATE E) 0.05 % emollient cream    Sig: Apply 1 application topically daily as needed (skin irritations).    Dispense:  30 g    Refill:  2    Order Specific Question:   Supervising Provider    Answer:   Cassandria Anger [[id number]]  . furosemide (LASIX) 40 MG tablet    Sig: TAKE 1 TABLET (40 MG TOTAL) BY MOUTH DAILY.    Dispense:  90 tablet    Refill:  1    Order Specific Question:   Supervising Provider    Answer:   Cassandria Anger [[id number]]  . labetalol (NORMODYNE) 200 MG tablet    Sig: Take 1 tablet (200 mg total) by mouth 2 (two) times daily.    Dispense:  180 tablet    Refill:  1    Order Specific Question:   Supervising Provider    Answer:    Cassandria Anger [[id number]]  . sitaGLIPtin (JANUVIA) 100 MG tablet    Sig: Take 1 tablet (100 mg total) by mouth daily.    Dispense:  30 tablet    Refill:  3    Order Specific Question:   Supervising Provider    Answer:   Cassandria Anger [[id number]]  . gabapentin (NEURONTIN) 300 MG capsule    Sig: Take 1 capsule (300 mg total) by mouth 3 (three) times daily.    Dispense:  90 capsule    Refill:  3    Order Specific Question:   Supervising Provider    Answer:   Cassandria Anger [[id number]]  . atorvastatin (LIPITOR) 20 MG tablet    Sig: Take 1 tablet (20 mg total) by mouth daily at 6 PM.    Dispense:  90 tablet    Refill:  1    Order Specific Question:   Supervising Provider    Answer:   Cassandria Anger [[id number]]   Recent Results (from the past 2160 hour(s))  Basic Metabolic Panel (BMET)     Status: Abnormal   Collection Time: 12/03/15  9:31 AM  Result Value Ref Range   Sodium 140 135 - 145 mEq/L   Potassium 4.4 3.5 - 5.1 mEq/L   Chloride 106 96 - 112 mEq/L   CO2 26 19 - 32 mEq/L   Glucose, Bld 93 70 - 99 mg/dL   BUN 30 (H) 6 - 23 mg/dL   Creatinine, Ser 1.34 (H) 0.40 - 1.20 mg/dL   Calcium 10.5 8.4 - 10.5 mg/dL   GFR 51.21 (L) >60.00 mL/min  POCT urinalysis dipstick     Status: Normal   Collection Time: 12/03/15  9:53 AM  Result Value Ref Range   Color, UA yellow    Clarity, UA clear    Glucose, UA negative    Bilirubin, UA negative    Ketones, UA negative    Spec Grav, UA >=1.030    Blood, UA negative    pH, UA 5.5    Protein, UA +   15mg /dl    Urobilinogen, UA 0.2    Nitrite, UA negative    Leukocytes, UA Negative Negative  VAS US CAROTID     Status: None   Collection Time: 12/07/15 10:03 AM  Result Value Ref Range   Right CCA prox sys 90 cm/s   Right CCA prox dias 15 cm/s   Right cca dist sys -60 cm/s   Left CCA prox sys 128 cm/s   Left CCA prox dias 24 cm/s   Left CCA dist sys -92 cm/s   Left CCA dist dias -26 cm/s   Left ICA prox sys -98 cm/s   Left ICA  prox dias -23 cm/s   Left ICA dist sys -105 cm/s   Left ICA dist dias -40 cm/s   RIGHT ECA DIAS -15.00 cm/s   RIGHT VERTEBRAL DIAS 12.00 cm/s   LEFT ECA DIAS -11.00 cm/s   LEFT VERTEBRAL DIAS 19.00 cm/s  Hepatic function panel     Status: Abnormal   Collection Time: 12/15/15 12:29 PM  Result Value Ref Range   Total Bilirubin 0.5 0.2 - 1.2 mg/dL   Bilirubin, Direct 0.1 0.0 - 0.3 mg/dL   Alkaline Phosphatase 67 39 - 117 U/L   AST 12 0 - 37 U/L   ALT 9 0 - 35 U/L   Total Protein 8.5 (H) 6.0 - 8.3 g/dL   Albumin 4.4 3.5 - 5.2 g/dL  TSH     Status: None   Collection Time: 12/15/15 12:29 PM  Result Value Ref Range   TSH 0.48 0.35 - 4.50 uIU/mL  CBC w/Diff     Status: Abnormal   Collection Time: 12/15/15 12:29 PM  Result Value Ref Range   WBC 5.3 4.0 - 10.5 K/uL   RBC 4.39 3.87 - 5.11 Mil/uL   Hemoglobin 12.5 12.0 - 15.0 g/dL   HCT 37.5 36.0 - 46.0 %   MCV 85.5 78.0 - 100.0 fl   MCHC 33.3 30.0 - 36.0 g/dL   RDW 16.7 (H) 11.5 - 15.5 %   Platelets 259.0 150.0 - 400.0 K/uL   Neutrophils Relative % 57.4 43.0 - 77.0 %   Lymphocytes Relative 31.9 12.0 - 46.0 %   Monocytes Relative 7.3 3.0 - 12.0 %   Eosinophils Relative 2.7 0.0 - 5.0 %   Basophils Relative 0.7 0.0 - 3.0 %   Neutro Abs 3.0 1.4 - 7.7 K/uL   Lymphs Abs 1.7 0.7 - 4.0 K/uL   Monocytes Absolute 0.4 0.1 - 1.0 K/uL   Eosinophils Absolute 0.1 0.0 - 0.7 K/uL   Basophils Absolute 0.0 0.0 - 0.1 K/uL  Lipid panel     Status: Abnormal   Collection Time: 12/15/15 12:29 PM  Result Value Ref Range   Cholesterol 208 (H) 0 - 200 mg/dL    Comment: ATP III Classification       Desirable:  < 200 mg/dL               Borderline High:  200 - 239 mg/dL          High:  > = 240 mg/dL   Triglycerides 88.0 0.0 - 149.0 mg/dL  Comment: Normal:  <150 mg/dLBorderline High:  150 - 199 mg/dL   HDL 61.00 >39.00 mg/dL   VLDL 17.6 0.0 - 40.0 mg/dL   LDL Cholesterol 130 (H) 0 - 99 mg/dL   Total CHOL/HDL Ratio 3     Comment:                Men           Women1/2 Average Risk     3.4          3.3Average Risk          5.0          4.42X Average Risk          9.6          7.13X Average Risk          15.0          11.0                       NonHDL 147.38     Comment: NOTE:  Non-HDL goal should be 30 mg/dL higher than patient's LDL goal (i.e. LDL goal of < 70 mg/dL, would have non-HDL goal of < 100 mg/dL)  Hemoglobin A1c     Status: None   Collection Time: 12/15/15 12:29 PM  Result Value Ref Range   Hgb A1c MFr Bld 5.6 4.6 - 6.5 %    Comment: Glycemic Control Guidelines for People with Diabetes:Non Diabetic:  <6%Goal of Therapy: <7%Additional Action Suggested:  >8%    Follow-up: Return in about 6 months (around 06/13/2016) for DM and HTN.  Wilfred Lacy, NP

## 2015-12-15 NOTE — Assessment & Plan Note (Signed)
Stable with lasix, potassium and labetalol. Has upcoming appt with cardiologist.

## 2015-12-15 NOTE — Assessment & Plan Note (Addendum)
Controlled. Continue labetalol and lisinopril

## 2015-12-15 NOTE — Assessment & Plan Note (Signed)
total cholesterol 208 and LDL 130. (LDL goal <70) Start lipitor 20mg .

## 2015-12-15 NOTE — Progress Notes (Signed)
Normal results, see office note

## 2015-12-15 NOTE — Assessment & Plan Note (Signed)
Controlled with HgbA1c 5.6  continue Januvia.

## 2015-12-16 ENCOUNTER — Ambulatory Visit: Payer: Self-pay

## 2015-12-16 LAB — HEPATITIS C ANTIBODY: HCV AB: NEGATIVE

## 2015-12-17 ENCOUNTER — Inpatient Hospital Stay (HOSPITAL_COMMUNITY): Admission: RE | Admit: 2015-12-17 | Payer: PPO | Source: Ambulatory Visit

## 2015-12-17 DIAGNOSIS — G4733 Obstructive sleep apnea (adult) (pediatric): Secondary | ICD-10-CM | POA: Diagnosis not present

## 2015-12-17 NOTE — Progress Notes (Signed)
Normal results, see office note

## 2015-12-21 ENCOUNTER — Telehealth (INDEPENDENT_AMBULATORY_CARE_PROVIDER_SITE_OTHER): Payer: Self-pay | Admitting: Orthopaedic Surgery

## 2015-12-21 ENCOUNTER — Ambulatory Visit: Payer: Self-pay

## 2015-12-21 NOTE — Telephone Encounter (Signed)
Call routed to front desk/ phones to call patient to reschedule her appointment.

## 2015-12-22 ENCOUNTER — Other Ambulatory Visit: Payer: Self-pay

## 2015-12-22 VITALS — Ht 68.0 in | Wt 287.0 lb

## 2015-12-22 DIAGNOSIS — J441 Chronic obstructive pulmonary disease with (acute) exacerbation: Secondary | ICD-10-CM

## 2015-12-22 NOTE — Patient Outreach (Signed)
[facility]) Care Management  12/22/2015  Stacey Page Nov 21, 1950 [id number]  Telephone contact with patient.  She reports respiratory status is stable.  She received her flu shot last month. Patient reports PCP visit last week for regular checkup, and no new problems reported.  Patient continues to work on loosing down to 250 pounds so that she can have knee replacement surgery.  She reports feeling frustrated that her weight has plateaued. Encouraged her to continue efforts to make healthy food choices, and be as active as possible.  She uses a walker to ambulate due to knee pain.   Candie Mile, RN, MSN [place] [phone] Fax [phone]

## 2015-12-23 ENCOUNTER — Ambulatory Visit: Payer: Self-pay

## 2015-12-23 ENCOUNTER — Ambulatory Visit (INDEPENDENT_AMBULATORY_CARE_PROVIDER_SITE_OTHER): Payer: Self-pay | Admitting: Orthopaedic Surgery

## 2015-12-23 NOTE — Telephone Encounter (Signed)
Patient scheduled 02/12/2016  1:15pm

## 2015-12-29 NOTE — Progress Notes (Signed)
This encounter was created in error - please disregard.

## 2016-01-11 ENCOUNTER — Other Ambulatory Visit: Payer: Self-pay | Admitting: Internal Medicine

## 2016-01-12 ENCOUNTER — Other Ambulatory Visit: Payer: Self-pay | Admitting: Pulmonary Disease

## 2016-01-16 ENCOUNTER — Other Ambulatory Visit: Payer: Self-pay | Admitting: Internal Medicine

## 2016-01-19 ENCOUNTER — Other Ambulatory Visit: Payer: Self-pay

## 2016-01-19 VITALS — Wt 287.0 lb

## 2016-01-19 DIAGNOSIS — J849 Interstitial pulmonary disease, unspecified: Secondary | ICD-10-CM

## 2016-01-19 NOTE — Patient Outreach (Signed)
[facility]) Care Management  01/19/2016  Stacey Page 1950-11-07 [id number]  Telephonic assessment with patient.  She reports she feels more positive about her efforts to lose weight and become more active.  Her weight remains steady at 287 pounds, but she reports she has increased her activity level in the home, and feels more independent.  Discussion about difficulties managing dietary guidelines during the holidays.  Encouraged to limit portions and make healthy food choices.  RN will follow up in January.  Candie Mile, RN, MSN [place] [phone] Fax [phone]

## 2016-02-12 ENCOUNTER — Ambulatory Visit (INDEPENDENT_AMBULATORY_CARE_PROVIDER_SITE_OTHER): Payer: PPO | Admitting: Orthopaedic Surgery

## 2016-02-15 ENCOUNTER — Ambulatory Visit: Payer: PPO | Admitting: Neurology

## 2016-02-23 ENCOUNTER — Ambulatory Visit (INDEPENDENT_AMBULATORY_CARE_PROVIDER_SITE_OTHER): Payer: PPO

## 2016-02-23 ENCOUNTER — Encounter (INDEPENDENT_AMBULATORY_CARE_PROVIDER_SITE_OTHER): Payer: Self-pay | Admitting: Orthopaedic Surgery

## 2016-02-23 ENCOUNTER — Ambulatory Visit (INDEPENDENT_AMBULATORY_CARE_PROVIDER_SITE_OTHER): Payer: PPO | Admitting: Orthopaedic Surgery

## 2016-02-23 VITALS — BP 131/61 | HR 58 | Ht 68.0 in | Wt 250.0 lb

## 2016-02-23 DIAGNOSIS — M25561 Pain in right knee: Secondary | ICD-10-CM | POA: Diagnosis not present

## 2016-02-23 DIAGNOSIS — G8929 Other chronic pain: Secondary | ICD-10-CM | POA: Diagnosis not present

## 2016-02-23 DIAGNOSIS — M25562 Pain in left knee: Secondary | ICD-10-CM | POA: Diagnosis not present

## 2016-02-23 NOTE — Progress Notes (Signed)
Office Visit Note   Patient: Stacey Page           Date of Birth: 07/16/1950           MRN: [id number] Visit Date: 02/23/2016                             Requested by: Flossie Buffy, NP [address], [place] [address] PCP: Wilfred Lacy, NP   Assessment & Plan: Visit Diagnoses:  1. Chronic pain of right knee   2. Chronic pain of left knee            End-stage bilateral primary knee osteoarthritis.  Plan: Patient has lost weight as recommended. She is reached her goal weight and is ready for total knee arthroplasty. She'll need preoperative medical clearance due to her history of hypertension, history of heart failure. Procedure discussed with surgery discussed. Anesthesia discussed postoperative therapy home physical therapy, outpatient physical therapy. She likely will need left total knee arthroplasty some point in the future. Questions were elicited and answered she understands and requests we proceed.  Follow-Up Instructions: No Follow-up on file.   Orders:  Orders Placed This Encounter  Procedures  . XR Knee 1-2 Views Right  . XR Knee 1-2 Views Left   No orders of the defined types were placed in this encounter. Result Narrative   Standing AP and lateral right knee obtained. This shows end-stage  osteoarthritis tricompartmental with large osteophyte subchondral  sclerosis flattening of the femoral condyle subchondral cyst formation.  Impression : severe primary osteoarthritis right knee. No evidence of  acute fracture.   Result Narrative   AP lateral left knee shows end-stage osteoarthritis bone-on-bone medial  compartment marginal osteophyte subchondral sclerosis.  Impression: Tricompartmental osteoarthritis left knee.       Procedures: No procedures performed   Clinical Data: No additional findings.   Subjective: Chief Complaint  Patient presents with  . Right Knee - Pain  . Left Knee - Pain    Patient returns after being seen  05/2015 for right knee pain. She states that knee surgery was discussed at that visit, but she needed to lose weight first. She states that she has lost about 50lbs since then. She is ready to discuss surgery. She has pain in both knees, but the right is much worse. She denies taking anything for pain.   Pain bothers on a daily basis it's difficult for her to sleep she's had intra-articular cortisone injections and anti-inflammatory medications. She has lost 50 pounds to get down to acceptable weight for total knee arthroplasty and now wants to proceed with right total knee arthroplasty.  Review of Systems  Constitutional: Negative for chills and diaphoresis.  HENT: Negative for ear discharge, ear pain and nosebleeds.   Eyes: Negative for discharge and visual disturbance.  Respiratory: Negative for cough, choking and shortness of breath.        Positive for COPD.  Cardiovascular: Negative for chest pain and palpitations.       Process for hypertension positive for history of diastolic heart failure. Hypercholesterolemia.  Gastrointestinal: Negative for abdominal distention and abdominal pain.  Endocrine: Negative for cold intolerance and heat intolerance.  Genitourinary: Negative for flank pain and hematuria.  Musculoskeletal:       Lateral knee pain right greater than left bone-on-bone changes. She has used anti-inflammatories previous cortisone injection, use of a cane. Pain with activities of daily living.  Skin: Negative  for rash and wound.  Neurological: Negative for seizures and speech difficulty.  Hematological: Negative for adenopathy. Does not bruise/bleed easily.  Psychiatric/Behavioral: Negative for agitation and suicidal ideas.     Objective: Vital Signs: BP 131/61   Pulse (!) 58   Ht 5\' 8"  (1.727 m)   Wt 250 lb (113.4 kg)   BMI 38.01 kg/m   Physical Exam  Constitutional: She is oriented to person, place, and time. She appears well-developed.  HENT:  Head: Normocephalic.   Right Ear: External ear normal.  Left Ear: External ear normal.  Eyes: Pupils are equal, round, and reactive to light.  Neck: No tracheal deviation present. No thyromegaly present.  Cardiovascular: Normal rate.   Pulmonary/Chest: Effort normal.  Abdominal: Soft.  Musculoskeletal:  Patient has bilateral knee effusion worsen the right than left. She lacks 5 degrees reaching  full extension. She flexes to 100 bilaterally. Collateral ligaments are stable no pes bursa tenderness distal pulses are 2+ negative [place]. No pitting edema.  Neurological: She is alert and oriented to person, place, and time.  Skin: Skin is warm and dry.  Psychiatric: She has a normal mood and affect. Her behavior is normal.    Ortho Exam bilateral patellofemoral crepitus. Medial more than lateral joint line tenderness.  Specialty Comments:  No specialty comments available.  Imaging: No results found.   PMFS History: Patient Active Problem List   Diagnosis Date Noted  . Hyperlipidemia LDL goal <70 12/15/2015  . Paresthesia of both feet 11/19/2015  . Pain in both lower extremities 11/19/2015  . Numbness in both hands 08/13/2015  . Panic attack 06/09/2015  . Right knee pain 05/14/2015  . Chronic renal insufficiency 09/16/2014  . Pneumothorax on right   . Atypical mycobacterium infection   . ILD (interstitial lung disease) ([place])   . Routine general medical examination at a health care facility 04/03/2014  . Morbid obesity ([place]) 04/03/2014  . OSA (obstructive sleep apnea) 02/25/2014  . Chronic diastolic heart failure ([place]) 02/25/2014  . Controlled steroid-induced diabetes mellitus ([place]) 02/25/2014  . Essential hypertension 02/25/2014   Past Medical History:  Diagnosis Date  . Acute renal failure [facility]) May 19, 2014  . Anxiety   . Aortic stenosis, mild    mild AS 01/2014 echo  . Asthma   . COPD (chronic obstructive pulmonary disease) ([place])   . Depression   . Diabetes ([place])    Type 2  . GERD  (gastroesophageal reflux disease)   . HTN (hypertension)   . Interstitial lung disease ([place]) 03/26/2014  . OA (osteoarthritis)   . OSA (obstructive sleep apnea) 02/25/2014   wears CPAP at night  . Seasonal allergies   . Shortness of breath on exertion   . Swelling of ankle    Bilateral; takes Lasix    Family History  Problem Relation Age of Onset  . COPD Father   . Hypothyroidism Father   . Anemia Father     iron deficiency  . Cancer Mother 4    pancreatic    Past Surgical History:  Procedure Laterality Date  . CESAREAN SECTION  x2  . KNEE ARTHROSCOPY Right   . LUNG BIOPSY Right 06/10/2014   Procedure: LUNG BIOPSY;  Surgeon: Ivin Poot, MD;  Location: [facility];  Service: Thoracic;  Laterality: Right;  . POLYPECTOMY     throat  . VIDEO ASSISTED THORACOSCOPY Right 06/10/2014   Procedure: VIDEO ASSISTED THORACOSCOPY;  Surgeon: Ivin Poot, MD;  Location: [facility];  Service: Thoracic;  Laterality: Right;   Social History   Occupational History  . disabled    Social History Main Topics  . Smoking status: Former Smoker    Packs/day: 0.25    Years: 15.00    Types: Cigarettes    Quit date: 02/08/1980  . Smokeless tobacco: Former Systems developer     Comment: 2 packs per week  . Alcohol use No     Comment: occassional/social/rare  . Drug use: No  . Sexual activity: No

## 2016-02-24 ENCOUNTER — Other Ambulatory Visit: Payer: Self-pay

## 2016-02-24 NOTE — Patient Outreach (Signed)
[facility]) Care Management  02/24/2016  Ezzie Senat October 29, 1950 [id number]   Unsuccessful attempt to reach patient by phone.  HIPAA appropriate message left requesting call back. If no response RN will make another attempt within 10 days.  Candie Mile, RN, MSN [place] [phone] Fax [phone]

## 2016-02-26 ENCOUNTER — Other Ambulatory Visit (INDEPENDENT_AMBULATORY_CARE_PROVIDER_SITE_OTHER): Payer: Self-pay | Admitting: Orthopaedic Surgery

## 2016-02-26 DIAGNOSIS — M1711 Unilateral primary osteoarthritis, right knee: Secondary | ICD-10-CM

## 2016-03-02 ENCOUNTER — Other Ambulatory Visit: Payer: Self-pay

## 2016-03-02 DIAGNOSIS — J849 Interstitial pulmonary disease, unspecified: Secondary | ICD-10-CM

## 2016-03-02 NOTE — Patient Outreach (Signed)
[facility]) Care Management  03/02/2016  Gudrun Axe 11-25-1950 [id number]   Case Closure  Telephone contact with patient.  She saw Dr. Lorin Mercy recently, and he has agreed to do a total knee replacement on 03-14-16.  She has succeeded in her goal to lose enough weight to be able to have surgery.  Discussed with her option of going to rehabilitation after d/c from the hospital.  Since she lives alone this may be the best option for her.  Encouraged her to contact her insurance provider ([employer]) to clarify coverage.  Discussed with patient discharge from [facility] due to pending admission.  Encouraged her to contact us for services after her discharge home if she needs to resume services.  Plan:  Patient will have total knee replacement on 03-14-16.           RN will notify CMA and PCP of case closure.

## 2016-03-07 ENCOUNTER — Encounter (HOSPITAL_COMMUNITY): Payer: Self-pay

## 2016-03-07 ENCOUNTER — Encounter (HOSPITAL_COMMUNITY)
Admission: RE | Admit: 2016-03-07 | Discharge: 2016-03-07 | Disposition: A | Discharge status: Home / Self Care | Payer: PPO | Source: Ambulatory Visit | Attending: Orthopaedic Surgery | Admitting: Orthopaedic Surgery

## 2016-03-07 DIAGNOSIS — Z01812 Encounter for preprocedural laboratory examination: Secondary | ICD-10-CM | POA: Diagnosis not present

## 2016-03-07 DIAGNOSIS — M1711 Unilateral primary osteoarthritis, right knee: Secondary | ICD-10-CM | POA: Diagnosis not present

## 2016-03-07 HISTORY — DX: Pneumonia, unspecified organism: J18.9

## 2016-03-07 HISTORY — DX: Insomnia, unspecified: G47.00

## 2016-03-07 HISTORY — DX: Polyneuropathy, unspecified: G62.9

## 2016-03-07 HISTORY — DX: Dermatitis, unspecified: L30.9

## 2016-03-07 HISTORY — DX: Personal history of other diseases of the respiratory system: Z87.09

## 2016-03-07 LAB — CBC
HEMATOCRIT: 39.9 % (ref 36.0–46.0)
HEMOGLOBIN: 12.8 g/dL (ref 12.0–15.0)
MCH: 28.4 pg (ref 26.0–34.0)
MCHC: 32.1 g/dL (ref 30.0–36.0)
MCV: 88.7 fL (ref 78.0–100.0)
Platelets: 247 10*3/uL (ref 150–400)
RBC: 4.5 MIL/uL (ref 3.87–5.11)
RDW: 14.5 % (ref 11.5–15.5)
WBC: 5.3 10*3/uL (ref 4.0–10.5)

## 2016-03-07 LAB — COMPREHENSIVE METABOLIC PANEL
ALBUMIN: 4.1 g/dL (ref 3.5–5.0)
ALK PHOS: 76 U/L (ref 38–126)
ALT: 12 U/L — AB (ref 14–54)
AST: 17 U/L (ref 15–41)
Anion gap: 8 (ref 5–15)
BILIRUBIN TOTAL: 0.7 mg/dL (ref 0.3–1.2)
BUN: 18 mg/dL (ref 6–20)
CALCIUM: 10.2 mg/dL (ref 8.9–10.3)
CO2: 27 mmol/L (ref 22–32)
CREATININE: 1.47 mg/dL — AB (ref 0.44–1.00)
Chloride: 103 mmol/L (ref 101–111)
GFR calc Af Amer: 42 mL/min — ABNORMAL LOW (ref >60)
GFR calc non Af Amer: 37 mL/min — ABNORMAL LOW (ref >60)
Glucose, Bld: 95 mg/dL (ref 65–99)
Potassium: 4.1 mmol/L (ref 3.5–5.1)
SODIUM: 138 mmol/L (ref 135–145)
TOTAL PROTEIN: 8 g/dL (ref 6.5–8.1)

## 2016-03-07 LAB — URINALYSIS, ROUTINE W REFLEX MICROSCOPIC
BILIRUBIN URINE: NEGATIVE
Glucose, UA: NEGATIVE mg/dL
Hgb urine dipstick: NEGATIVE
Ketones, ur: NEGATIVE mg/dL
Nitrite: NEGATIVE
PH: 6 (ref 5.0–8.0)
Protein, ur: NEGATIVE mg/dL
SPECIFIC GRAVITY, URINE: 1.01 (ref 1.005–1.030)

## 2016-03-07 LAB — GLUCOSE, CAPILLARY: Glucose-Capillary: 85 mg/dL (ref 65–99)

## 2016-03-07 LAB — SURGICAL PCR SCREEN
MRSA, PCR: NEGATIVE
STAPHYLOCOCCUS AUREUS: NEGATIVE

## 2016-03-07 MED ORDER — CHLORHEXIDINE GLUCONATE 4 % EX LIQD: 60.0000 mL | Freq: Once | CUTANEOUS | Status: DC

## 2016-03-07 NOTE — Progress Notes (Addendum)
Cardiologist denies   Sees NP Wilfred Lacy   echo reports in epic from 2015/2017  Stress test   Heart cath denies  EKG in epic from 11-23-15  CXR in epic from 06-16-15

## 2016-03-07 NOTE — Pre-Procedure Instructions (Signed)
Stacey Page  03/07/2016      [id number] [address], [place] [place] [address] Phone: [phone] Fax: [phone]    Your procedure is scheduled on Mon, Feb 5 @ 12:30 PM  Report to [facility] Admitting at 10:30 AM  Call this number if you have problems the morning of surgery:  [phone]   Remember:  Do not eat food or drink liquids after midnight.  Take these medicines the morning of surgery with A SIP OF WATER Albuterol<Bring Your Inhaler With You>,Flonase(Fluticasone),Gabapentin(Neurontin),Pain Pill(if needed),and Labetalol(Normodyne)              No Goody's,BC's,Aleve,Advil,Motrin,Ibuprofen,Fish Oil,or any Herbal Medications.      How to Manage Your Diabetes Before and After Surgery  Why is it important to control my blood sugar before and after surgery? . Improving blood sugar levels before and after surgery helps healing and can limit problems. . A way of improving blood sugar control is eating a healthy diet by: o  Eating less sugar and carbohydrates o  Increasing activity/exercise o  Talking with your doctor about reaching your blood sugar goals . High blood sugars (greater than 180 mg/dL) can raise your risk of infections and slow your recovery, so you will need to focus on controlling your diabetes during the weeks before surgery. . Make sure that the doctor who takes care of your diabetes knows about your planned surgery including the date and location.  How do I manage my blood sugar before surgery? . Check your blood sugar at least 4 times a day, starting 2 days before surgery, to make sure that the level is not too high or low. o Check your blood sugar the morning of your surgery when you wake up and every 2 hours until you get to the Short Stay unit. . If your blood sugar is less than 70 mg/dL, you will need to treat for low blood sugar: o Do not take insulin. o Treat a low blood sugar  (less than 70 mg/dL) with  cup of clear juice (cranberry or apple), 4 glucose tablets, OR glucose gel. o Recheck blood sugar in 15 minutes after treatment (to make sure it is greater than 70 mg/dL). If your blood sugar is not greater than 70 mg/dL on recheck, call [phone] for further instructions. . Report your blood sugar to the short stay nurse when you get to Short Stay.  . If you are admitted to the hospital after surgery: o Your blood sugar will be checked by the staff and you will probably be given insulin after surgery (instead of oral diabetes medicines) to make sure you have good blood sugar levels. o The goal for blood sugar control after surgery is 80-180 mg/dL.              WHAT DO I DO ABOUT MY DIABETES MEDICATION?   Marland Kitchen Do not take oral diabetes medicines (pills) the morning of surgery.   Reviewed and Endorsed by [facility] Patient Education Committee, August 2015   Do not wear jewelry, make-up or nail polish.  Do not wear lotions, powders, perfumes, or deoderant.  Do not shave 48 hours prior to surgery.    Do not bring valuables to the hospital.  [facility] is not responsible for any belongings or valuables.  Contacts, dentures or bridgework may not be worn into surgery.  Leave your suitcase in the car.  After surgery it may be brought  to your room.  For patients admitted to the hospital, discharge time will be determined by your treatment team.  Patients discharged the day of surgery will not be allowed to drive home.    [facility]  Before surgery, you can play an important role.  Because skin is not sterile, your skin needs to be as free of germs as possible.  You can reduce the number of germs on you skin by washing with CHG (chlorahexidine gluconate) soap before surgery.  CHG is an antiseptic cleaner which kills germs and bonds with the skin to continue killing germs even after washing.  Please DO NOT use  if you have an allergy to CHG or antibacterial soaps.  If your skin becomes reddened/irritated stop using the CHG and inform your nurse when you arrive at Short Stay.  Do not shave (including legs and underarms) for at least 48 hours prior to the first CHG shower.  You may shave your face.  Please follow these instructions carefully:   1.  Shower with CHG Soap the night before surgery and the                                morning of Surgery.  2.  If you choose to wash your hair, wash your hair first as usual with your       normal shampoo.  3.  After you shampoo, rinse your hair and body thoroughly to remove the                      Shampoo.  4.  Use CHG as you would any other liquid soap.  You can apply chg directly       to the skin and wash gently with scrungie or a clean washcloth.  5.  Apply the CHG Soap to your body ONLY FROM THE NECK DOWN.        Do not use on open wounds or open sores.  Avoid contact with your eyes,       ears, mouth and genitals (private parts).  Wash genitals (private parts)       with your normal soap.  6.  Wash thoroughly, paying special attention to the area where your surgery        will be performed.  7.  Thoroughly rinse your body with warm water from the neck down.  8.  DO NOT shower/wash with your normal soap after using and rinsing off       the CHG Soap.  9.  Pat yourself dry with a clean towel.            10.  Wear clean pajamas.            11.  Place clean sheets on your bed the night of your first shower and do not        sleep with pets.  Day of Surgery  Do not apply any lotions/deoderants the morning of surgery.  Please wear clean clothes to the hospital/surgery center.    Please read over the following fact sheets that you were given. Pain Booklet, Coughing and Deep Breathing, MRSA Information and Surgical Site Infection Prevention

## 2016-03-08 ENCOUNTER — Ambulatory Visit (INDEPENDENT_AMBULATORY_CARE_PROVIDER_SITE_OTHER): Payer: PPO | Admitting: Podiatry

## 2016-03-08 ENCOUNTER — Encounter: Payer: Self-pay | Admitting: Podiatry

## 2016-03-08 DIAGNOSIS — M79676 Pain in unspecified toe(s): Secondary | ICD-10-CM

## 2016-03-08 DIAGNOSIS — B351 Tinea unguium: Secondary | ICD-10-CM | POA: Diagnosis not present

## 2016-03-08 LAB — HEMOGLOBIN A1C
Hgb A1c MFr Bld: 5.5 % (ref 4.8–5.6)
MEAN PLASMA GLUCOSE: 111 mg/dL

## 2016-03-08 NOTE — Progress Notes (Signed)
   Subjective:    Patient ID: Stacey Page, female    DOB: 04/12/1950, 66 y.o.   MRN: [id number]  HPI this patient presents the office for evaluation and treatment of her diabetic feet. She says that she has had pain in her toes on both feet. She says that she was referred to this office by her medical doctor. She is been diabetic for 7 years and is taken. Januvia and gabapentin. She presents the office today for an evaluation of her feet and treatment of her long painful nails    Review of Systems  All other systems reviewed and are negative.      Objective:   Physical Exam GENERAL APPEARANCE: Alert, conversant. Appropriately groomed. No acute distress.  VASCULAR: Pedal pulses are weakly   palpable at  [facility] and PT bilateral.  Capillary refill time is immediate to all digits,  Normal temperature gradient.    NEUROLOGIC: sensation is normal to 5.07 monofilament at 5/5 sites bilateral.  Light touch is intact bilateral, Muscle strength normal.  MUSCULOSKELETAL: acceptable muscle strength, tone and stability bilateral.  Intrinsic muscluature intact bilateral.  Rectus appearance of foot and digits noted bilateral.   DERMATOLOGIC: skin color, texture, and turgor are within normal limits.  No preulcerative lesions or ulcers  are seen, no interdigital maceration noted.  No open lesions present.   No drainage noted.  NAILS  Thick disfigured discolored nails both feet         Assessment & Plan:  Pain due to onychomycosis  Diabetes with neuropathy   IE  Debridement of nails.  RTC 3 months   Gardiner Barefoot DPM

## 2016-03-10 ENCOUNTER — Other Ambulatory Visit: Payer: Self-pay | Admitting: *Deleted

## 2016-03-10 MED ORDER — ALBUTEROL SULFATE HFA 108 (90 BASE) MCG/ACT IN AERS: 2.0000 | INHALATION_SPRAY | Freq: Four times a day (QID) | RESPIRATORY_TRACT | 3 refills | Status: DC | PRN

## 2016-03-11 ENCOUNTER — Other Ambulatory Visit: Payer: Self-pay | Admitting: Nurse Practitioner

## 2016-03-11 DIAGNOSIS — R55 Syncope and collapse: Secondary | ICD-10-CM

## 2016-03-11 DIAGNOSIS — Z01810 Encounter for preprocedural cardiovascular examination: Secondary | ICD-10-CM

## 2016-03-11 DIAGNOSIS — I5032 Chronic diastolic (congestive) heart failure: Secondary | ICD-10-CM

## 2016-03-14 ENCOUNTER — Encounter (HOSPITAL_COMMUNITY): Payer: Self-pay | Admitting: *Deleted

## 2016-03-14 ENCOUNTER — Inpatient Hospital Stay (HOSPITAL_COMMUNITY)
Admission: RE | Admit: 2016-03-14 | Discharge: 2016-03-19 | DRG: 470 | Disposition: A | Discharge status: Skilled Nursing Facility | Payer: PPO | Source: Ambulatory Visit | Attending: Orthopaedic Surgery | Admitting: Orthopaedic Surgery

## 2016-03-14 ENCOUNTER — Inpatient Hospital Stay (HOSPITAL_COMMUNITY): Payer: PPO

## 2016-03-14 ENCOUNTER — Encounter (HOSPITAL_COMMUNITY)
Admission: RE | Disposition: A | Discharge status: Skilled Nursing Facility | Payer: Self-pay | Source: Ambulatory Visit | Attending: Orthopaedic Surgery

## 2016-03-14 ENCOUNTER — Inpatient Hospital Stay (HOSPITAL_COMMUNITY): Payer: PPO | Admitting: Certified Registered"

## 2016-03-14 DIAGNOSIS — M6281 Muscle weakness (generalized): Secondary | ICD-10-CM | POA: Diagnosis not present

## 2016-03-14 DIAGNOSIS — D649 Anemia, unspecified: Secondary | ICD-10-CM | POA: Diagnosis present

## 2016-03-14 DIAGNOSIS — E0922 Drug or chemical induced diabetes mellitus with diabetic chronic kidney disease: Secondary | ICD-10-CM | POA: Diagnosis present

## 2016-03-14 DIAGNOSIS — Z79899 Other long term (current) drug therapy: Secondary | ICD-10-CM | POA: Diagnosis not present

## 2016-03-14 DIAGNOSIS — N183 Chronic kidney disease, stage 3 unspecified: Secondary | ICD-10-CM | POA: Diagnosis present

## 2016-03-14 DIAGNOSIS — E099 Drug or chemical induced diabetes mellitus without complications: Secondary | ICD-10-CM | POA: Diagnosis not present

## 2016-03-14 DIAGNOSIS — Z96651 Presence of right artificial knee joint: Secondary | ICD-10-CM

## 2016-03-14 DIAGNOSIS — G4733 Obstructive sleep apnea (adult) (pediatric): Secondary | ICD-10-CM | POA: Diagnosis present

## 2016-03-14 DIAGNOSIS — E0965 Drug or chemical induced diabetes mellitus with hyperglycemia: Secondary | ICD-10-CM | POA: Diagnosis not present

## 2016-03-14 DIAGNOSIS — M1711 Unilateral primary osteoarthritis, right knee: Secondary | ICD-10-CM

## 2016-03-14 DIAGNOSIS — I110 Hypertensive heart disease with heart failure: Secondary | ICD-10-CM | POA: Diagnosis present

## 2016-03-14 DIAGNOSIS — I959 Hypotension, unspecified: Secondary | ICD-10-CM | POA: Diagnosis not present

## 2016-03-14 DIAGNOSIS — F419 Anxiety disorder, unspecified: Secondary | ICD-10-CM | POA: Diagnosis not present

## 2016-03-14 DIAGNOSIS — Z6838 Body mass index (BMI) 38.0-38.9, adult: Secondary | ICD-10-CM

## 2016-03-14 DIAGNOSIS — Z87891 Personal history of nicotine dependence: Secondary | ICD-10-CM | POA: Diagnosis not present

## 2016-03-14 DIAGNOSIS — T380X5A Adverse effect of glucocorticoids and synthetic analogues, initial encounter: Secondary | ICD-10-CM | POA: Diagnosis present

## 2016-03-14 DIAGNOSIS — E119 Type 2 diabetes mellitus without complications: Secondary | ICD-10-CM | POA: Diagnosis present

## 2016-03-14 DIAGNOSIS — I5032 Chronic diastolic (congestive) heart failure: Secondary | ICD-10-CM | POA: Diagnosis present

## 2016-03-14 DIAGNOSIS — J849 Interstitial pulmonary disease, unspecified: Secondary | ICD-10-CM | POA: Diagnosis not present

## 2016-03-14 DIAGNOSIS — I10 Essential (primary) hypertension: Secondary | ICD-10-CM | POA: Diagnosis not present

## 2016-03-14 DIAGNOSIS — N179 Acute kidney failure, unspecified: Secondary | ICD-10-CM | POA: Diagnosis not present

## 2016-03-14 DIAGNOSIS — K219 Gastro-esophageal reflux disease without esophagitis: Secondary | ICD-10-CM | POA: Diagnosis not present

## 2016-03-14 DIAGNOSIS — J302 Other seasonal allergic rhinitis: Secondary | ICD-10-CM | POA: Diagnosis not present

## 2016-03-14 DIAGNOSIS — D62 Acute posthemorrhagic anemia: Secondary | ICD-10-CM

## 2016-03-14 DIAGNOSIS — E1159 Type 2 diabetes mellitus with other circulatory complications: Secondary | ICD-10-CM | POA: Diagnosis present

## 2016-03-14 DIAGNOSIS — I130 Hypertensive heart and chronic kidney disease with heart failure and stage 1 through stage 4 chronic kidney disease, or unspecified chronic kidney disease: Secondary | ICD-10-CM | POA: Diagnosis present

## 2016-03-14 DIAGNOSIS — E785 Hyperlipidemia, unspecified: Secondary | ICD-10-CM | POA: Diagnosis present

## 2016-03-14 DIAGNOSIS — Z471 Aftercare following joint replacement surgery: Secondary | ICD-10-CM | POA: Diagnosis not present

## 2016-03-14 DIAGNOSIS — R531 Weakness: Secondary | ICD-10-CM | POA: Diagnosis not present

## 2016-03-14 DIAGNOSIS — J449 Chronic obstructive pulmonary disease, unspecified: Secondary | ICD-10-CM | POA: Diagnosis not present

## 2016-03-14 DIAGNOSIS — Z09 Encounter for follow-up examination after completed treatment for conditions other than malignant neoplasm: Secondary | ICD-10-CM

## 2016-03-14 DIAGNOSIS — G8918 Other acute postprocedural pain: Secondary | ICD-10-CM | POA: Diagnosis not present

## 2016-03-14 DIAGNOSIS — E0942 Drug or chemical induced diabetes mellitus with neurological complications with diabetic polyneuropathy: Secondary | ICD-10-CM | POA: Diagnosis not present

## 2016-03-14 DIAGNOSIS — M25569 Pain in unspecified knee: Secondary | ICD-10-CM | POA: Diagnosis not present

## 2016-03-14 DIAGNOSIS — Z4789 Encounter for other orthopedic aftercare: Secondary | ICD-10-CM | POA: Diagnosis not present

## 2016-03-14 HISTORY — PX: TOTAL KNEE ARTHROPLASTY: SHX125

## 2016-03-14 HISTORY — DX: Unilateral primary osteoarthritis, right knee: M17.11

## 2016-03-14 LAB — GLUCOSE, CAPILLARY
GLUCOSE-CAPILLARY: 82 mg/dL (ref 65–99)
GLUCOSE-CAPILLARY: 80 mg/dL (ref 65–99)
Glucose-Capillary: 93 mg/dL (ref 65–99)
Glucose-Capillary: 132 mg/dL — ABNORMAL HIGH (ref 65–99)

## 2016-03-14 SURGERY — ARTHROPLASTY, KNEE, TOTAL
Anesthesia: Spinal | Laterality: Right

## 2016-03-14 MED ORDER — BUPIVACAINE IN DEXTROSE 0.75-8.25 % IT SOLN
INTRATHECAL | Status: DC | PRN
  Administered 2016-03-14: 15 mg via INTRATHECAL

## 2016-03-14 MED ORDER — INSULIN ASPART 100 UNIT/ML ~~LOC~~ SOLN: 0.0000 [IU] | Freq: Three times a day (TID) | SUBCUTANEOUS | Status: DC

## 2016-03-14 MED ORDER — ACETAMINOPHEN 650 MG RE SUPP: 650.0000 mg | Freq: Four times a day (QID) | RECTAL | Status: DC | PRN

## 2016-03-14 MED ORDER — HYDROMORPHONE HCL 2 MG/ML IJ SOLN: 1.0000 mg | INTRAMUSCULAR | Status: DC | PRN

## 2016-03-14 MED ORDER — SODIUM CHLORIDE 0.9 % IV SOLN
INTRAVENOUS | Status: DC
  Administered 2016-03-14: 18:00:00 via INTRAVENOUS

## 2016-03-14 MED ORDER — FUROSEMIDE 40 MG PO TABS
40.0000 mg | ORAL_TABLET | Freq: Every day | ORAL | Status: DC
  Administered 2016-03-15 – 2016-03-17 (×3): 40 mg via ORAL
  Filled 2016-03-14 (×4): qty 1

## 2016-03-14 MED ORDER — HYDROMORPHONE HCL 1 MG/ML IJ SOLN
INTRAMUSCULAR | Status: AC
  Filled 2016-03-14: qty 0.5

## 2016-03-14 MED ORDER — FENTANYL CITRATE (PF) 100 MCG/2ML IJ SOLN
100.0000 ug | Freq: Once | INTRAMUSCULAR | Status: AC
  Administered 2016-03-14: 100 ug via INTRAVENOUS

## 2016-03-14 MED ORDER — METOCLOPRAMIDE HCL 5 MG/ML IJ SOLN: 5.0000 mg | Freq: Three times a day (TID) | INTRAMUSCULAR | Status: DC | PRN

## 2016-03-14 MED ORDER — ROPIVACAINE HCL 7.5 MG/ML IJ SOLN
INTRAMUSCULAR | Status: DC | PRN
  Administered 2016-03-14: 20 mL via PERINEURAL

## 2016-03-14 MED ORDER — MIDAZOLAM HCL 2 MG/2ML IJ SOLN
INTRAMUSCULAR | Status: AC
  Filled 2016-03-14: qty 2

## 2016-03-14 MED ORDER — METHOCARBAMOL 500 MG PO TABS
500.0000 mg | ORAL_TABLET | Freq: Four times a day (QID) | ORAL | Status: DC | PRN
  Administered 2016-03-14 – 2016-03-19 (×8): 500 mg via ORAL
  Filled 2016-03-14 (×8): qty 1

## 2016-03-14 MED ORDER — PROPOFOL 500 MG/50ML IV EMUL
INTRAVENOUS | Status: DC | PRN
  Administered 2016-03-14: 50 ug/kg/min via INTRAVENOUS

## 2016-03-14 MED ORDER — DOCUSATE SODIUM 100 MG PO CAPS
100.0000 mg | ORAL_CAPSULE | Freq: Two times a day (BID) | ORAL | Status: DC
  Administered 2016-03-14 – 2016-03-18 (×7): 100 mg via ORAL
  Filled 2016-03-14 (×9): qty 1

## 2016-03-14 MED ORDER — EPHEDRINE SULFATE 50 MG/ML IJ SOLN
INTRAMUSCULAR | Status: DC | PRN
  Administered 2016-03-14: 10 mg via INTRAVENOUS
  Administered 2016-03-14: 15 mg via INTRAVENOUS
  Administered 2016-03-14 (×3): 10 mg via INTRAVENOUS

## 2016-03-14 MED ORDER — GABAPENTIN 300 MG PO CAPS
600.0000 mg | ORAL_CAPSULE | Freq: Once | ORAL | Status: AC
  Administered 2016-03-14: 600 mg via ORAL

## 2016-03-14 MED ORDER — POLYETHYLENE GLYCOL 3350 17 G PO PACK: 17.0000 g | PACK | Freq: Every day | ORAL | Status: DC | PRN

## 2016-03-14 MED ORDER — LISINOPRIL 20 MG PO TABS: 20.0000 mg | ORAL_TABLET | Freq: Every day | ORAL | Status: DC

## 2016-03-14 MED ORDER — METHOCARBAMOL 1000 MG/10ML IJ SOLN
500.0000 mg | Freq: Four times a day (QID) | INTRAVENOUS | Status: DC | PRN
  Filled 2016-03-14: qty 5

## 2016-03-14 MED ORDER — HYDROMORPHONE HCL 1 MG/ML IJ SOLN
0.2500 mg | INTRAMUSCULAR | Status: DC | PRN
  Administered 2016-03-14 (×3): 0.5 mg via INTRAVENOUS

## 2016-03-14 MED ORDER — CEFAZOLIN SODIUM-DEXTROSE 2-4 GM/100ML-% IV SOLN
2.0000 g | INTRAVENOUS | Status: AC
  Administered 2016-03-14: 2 g via INTRAVENOUS

## 2016-03-14 MED ORDER — ONDANSETRON HCL 4 MG/2ML IJ SOLN
4.0000 mg | Freq: Four times a day (QID) | INTRAMUSCULAR | Status: DC | PRN
  Administered 2016-03-14 – 2016-03-15 (×2): 4 mg via INTRAVENOUS
  Filled 2016-03-14 (×2): qty 2

## 2016-03-14 MED ORDER — PROPOFOL 10 MG/ML IV BOLUS
INTRAVENOUS | Status: AC
  Filled 2016-03-14: qty 20

## 2016-03-14 MED ORDER — ALBUTEROL SULFATE (2.5 MG/3ML) 0.083% IN NEBU
2.5000 mg | INHALATION_SOLUTION | Freq: Four times a day (QID) | RESPIRATORY_TRACT | Status: DC | PRN
Start: 2016-03-14 — End: 2016-03-19

## 2016-03-14 MED ORDER — BUPIVACAINE HCL (PF) 0.25 % IJ SOLN
INTRAMUSCULAR | Status: AC
  Filled 2016-03-14: qty 30

## 2016-03-14 MED ORDER — LACTATED RINGERS IV SOLN
INTRAVENOUS | Status: DC
  Administered 2016-03-14 (×2): via INTRAVENOUS

## 2016-03-14 MED ORDER — CEFAZOLIN SODIUM-DEXTROSE 2-4 GM/100ML-% IV SOLN
INTRAVENOUS | Status: AC
  Filled 2016-03-14: qty 100

## 2016-03-14 MED ORDER — ACETAMINOPHEN 325 MG PO TABS
650.0000 mg | ORAL_TABLET | Freq: Four times a day (QID) | ORAL | Status: DC | PRN
  Administered 2016-03-16: 650 mg via ORAL
  Filled 2016-03-14: qty 2

## 2016-03-14 MED ORDER — PHENOL 1.4 % MT LIQD: 1.0000 | OROMUCOSAL | Status: DC | PRN

## 2016-03-14 MED ORDER — MIDAZOLAM HCL 2 MG/2ML IJ SOLN
2.0000 mg | Freq: Once | INTRAMUSCULAR | Status: AC
  Administered 2016-03-14: 2 mg via INTRAVENOUS

## 2016-03-14 MED ORDER — ATORVASTATIN CALCIUM 20 MG PO TABS
20.0000 mg | ORAL_TABLET | Freq: Every day | ORAL | Status: DC
  Administered 2016-03-18: 20 mg via ORAL
  Filled 2016-03-14 (×2): qty 1

## 2016-03-14 MED ORDER — LIDOCAINE 2% (20 MG/ML) 5 ML SYRINGE
INTRAMUSCULAR | Status: AC
  Filled 2016-03-14: qty 5

## 2016-03-14 MED ORDER — GABAPENTIN 300 MG PO CAPS: 300.0000 mg | ORAL_CAPSULE | Freq: Two times a day (BID) | ORAL | Status: DC | PRN

## 2016-03-14 MED ORDER — ACETAMINOPHEN 500 MG PO TABS
ORAL_TABLET | ORAL | Status: AC
  Administered 2016-03-14: 1000 mg via ORAL
  Filled 2016-03-14: qty 2

## 2016-03-14 MED ORDER — MONTELUKAST SODIUM 10 MG PO TABS
10.0000 mg | ORAL_TABLET | Freq: Every day | ORAL | Status: DC
  Administered 2016-03-14 – 2016-03-18 (×5): 10 mg via ORAL
  Filled 2016-03-14 (×5): qty 1

## 2016-03-14 MED ORDER — FENTANYL CITRATE (PF) 100 MCG/2ML IJ SOLN
INTRAMUSCULAR | Status: AC
  Filled 2016-03-14: qty 4

## 2016-03-14 MED ORDER — MIDAZOLAM HCL 2 MG/2ML IJ SOLN
INTRAMUSCULAR | Status: AC
  Administered 2016-03-14: 2 mg via INTRAVENOUS
  Filled 2016-03-14: qty 2

## 2016-03-14 MED ORDER — LINAGLIPTIN 5 MG PO TABS
5.0000 mg | ORAL_TABLET | Freq: Every day | ORAL | Status: DC
  Administered 2016-03-15 – 2016-03-18 (×2): 5 mg via ORAL
  Filled 2016-03-14 (×5): qty 1

## 2016-03-14 MED ORDER — 0.9 % SODIUM CHLORIDE (POUR BTL) OPTIME
TOPICAL | Status: DC | PRN
  Administered 2016-03-14: 1000 mL

## 2016-03-14 MED ORDER — LABETALOL HCL 200 MG PO TABS
200.0000 mg | ORAL_TABLET | Freq: Two times a day (BID) | ORAL | Status: DC
  Administered 2016-03-14 – 2016-03-17 (×6): 200 mg via ORAL
  Filled 2016-03-14 (×6): qty 1

## 2016-03-14 MED ORDER — ALBUTEROL SULFATE HFA 108 (90 BASE) MCG/ACT IN AERS: 2.0000 | INHALATION_SPRAY | Freq: Four times a day (QID) | RESPIRATORY_TRACT | Status: DC | PRN

## 2016-03-14 MED ORDER — ROCURONIUM BROMIDE 50 MG/5ML IV SOSY
PREFILLED_SYRINGE | INTRAVENOUS | Status: AC
  Filled 2016-03-14: qty 5

## 2016-03-14 MED ORDER — POTASSIUM CITRATE ER 10 MEQ (1080 MG) PO TBCR
10.0000 meq | EXTENDED_RELEASE_TABLET | Freq: Every day | ORAL | Status: DC
  Administered 2016-03-15 – 2016-03-19 (×5): 10 meq via ORAL
  Filled 2016-03-14 (×5): qty 1

## 2016-03-14 MED ORDER — METOCLOPRAMIDE HCL 5 MG PO TABS: 5.0000 mg | ORAL_TABLET | Freq: Three times a day (TID) | ORAL | Status: DC | PRN

## 2016-03-14 MED ORDER — MENTHOL 3 MG MT LOZG: 1.0000 | LOZENGE | OROMUCOSAL | Status: DC | PRN

## 2016-03-14 MED ORDER — ONDANSETRON HCL 4 MG PO TABS: 4.0000 mg | ORAL_TABLET | Freq: Four times a day (QID) | ORAL | Status: DC | PRN

## 2016-03-14 MED ORDER — FENTANYL CITRATE (PF) 100 MCG/2ML IJ SOLN
INTRAMUSCULAR | Status: DC | PRN
  Administered 2016-03-14 (×2): 50 ug via INTRAVENOUS

## 2016-03-14 MED ORDER — FENTANYL CITRATE (PF) 100 MCG/2ML IJ SOLN
INTRAMUSCULAR | Status: AC
  Administered 2016-03-14: 100 ug via INTRAVENOUS
  Filled 2016-03-14: qty 2

## 2016-03-14 MED ORDER — ACETAMINOPHEN 500 MG PO TABS
1000.0000 mg | ORAL_TABLET | Freq: Once | ORAL | Status: AC
  Administered 2016-03-14: 1000 mg via ORAL

## 2016-03-14 MED ORDER — SODIUM CHLORIDE 0.9 % IR SOLN
Status: DC | PRN
  Administered 2016-03-14: 3000 mL

## 2016-03-14 MED ORDER — LIDOCAINE HCL (CARDIAC) 20 MG/ML IV SOLN
INTRAVENOUS | Status: DC | PRN
  Administered 2016-03-14: 100 mg via INTRATRACHEAL

## 2016-03-14 MED ORDER — ASPIRIN EC 325 MG PO TBEC
325.0000 mg | DELAYED_RELEASE_TABLET | Freq: Every day | ORAL | Status: DC
  Administered 2016-03-15 – 2016-03-19 (×5): 325 mg via ORAL
  Filled 2016-03-14 (×5): qty 1

## 2016-03-14 MED ORDER — PROPOFOL 10 MG/ML IV BOLUS
INTRAVENOUS | Status: DC | PRN
  Administered 2016-03-14: 30 mg via INTRAVENOUS

## 2016-03-14 MED ORDER — OXYCODONE HCL 5 MG PO TABS
5.0000 mg | ORAL_TABLET | ORAL | Status: DC | PRN
  Administered 2016-03-14 – 2016-03-15 (×3): 10 mg via ORAL
  Administered 2016-03-15 (×2): 5 mg via ORAL
  Administered 2016-03-15 – 2016-03-19 (×11): 10 mg via ORAL
  Filled 2016-03-14: qty 2
  Filled 2016-03-14 (×2): qty 1
  Filled 2016-03-14 (×13): qty 2

## 2016-03-14 MED ORDER — LISINOPRIL 20 MG PO TABS
20.0000 mg | ORAL_TABLET | Freq: Every day | ORAL | Status: DC
  Administered 2016-03-15 – 2016-03-17 (×3): 20 mg via ORAL
  Filled 2016-03-14 (×4): qty 1

## 2016-03-14 MED ORDER — GABAPENTIN 300 MG PO CAPS
ORAL_CAPSULE | ORAL | Status: AC
  Administered 2016-03-14: 600 mg via ORAL
  Filled 2016-03-14: qty 2

## 2016-03-14 MED ORDER — ALPRAZOLAM 0.25 MG PO TABS
0.2500 mg | ORAL_TABLET | Freq: Two times a day (BID) | ORAL | Status: DC | PRN
  Administered 2016-03-16: 0.25 mg via ORAL
  Filled 2016-03-14: qty 1

## 2016-03-14 MED ORDER — FLUTICASONE PROPIONATE 50 MCG/ACT NA SUSP
1.0000 | Freq: Every day | NASAL | Status: DC
  Administered 2016-03-15 – 2016-03-18 (×3): 1 via NASAL
  Filled 2016-03-14: qty 16

## 2016-03-14 SURGICAL SUPPLY — 66 items
BANDAGE ACE 4X5 VEL STRL LF (GAUZE/BANDAGES/DRESSINGS) ×2 IMPLANT
BANDAGE ESMARK 6X9 LF (GAUZE/BANDAGES/DRESSINGS) ×1 IMPLANT
BENZOIN TINCTURE PRP APPL 2/3 (GAUZE/BANDAGES/DRESSINGS) ×2 IMPLANT
BLADE SAGITTAL 25.0X1.19X90 (BLADE) ×2 IMPLANT
BLADE SAW SGTL 13X75X1.27 (BLADE) ×2 IMPLANT
BNDG ELASTIC 6X10 VLCR STRL LF (GAUZE/BANDAGES/DRESSINGS) ×2 IMPLANT
BNDG ESMARK 6X9 LF (GAUZE/BANDAGES/DRESSINGS) ×2
BOWL SMART MIX CTS (DISPOSABLE) ×2 IMPLANT
CAPT KNEE TOTAL 3 ATTUNE ×2 IMPLANT
CEMENT HV SMART SET (Cement) ×4 IMPLANT
CLSR STERI-STRIP ANTIMIC 1/2X4 (GAUZE/BANDAGES/DRESSINGS) ×2 IMPLANT
COVER SURGICAL LIGHT HANDLE (MISCELLANEOUS) ×2 IMPLANT
CUFF TOURNIQUET SINGLE 34IN LL (TOURNIQUET CUFF) ×2 IMPLANT
CUFF TOURNIQUET SINGLE 44IN (TOURNIQUET CUFF) IMPLANT
DRAPE ORTHO SPLIT 77X108 STRL (DRAPES) ×2
DRAPE SURG ORHT 6 SPLT 77X108 (DRAPES) ×2 IMPLANT
DRAPE U-SHAPE 47X51 STRL (DRAPES) ×2 IMPLANT
DRSG PAD ABDOMINAL 8X10 ST (GAUZE/BANDAGES/DRESSINGS) ×2 IMPLANT
DURAPREP 26ML APPLICATOR (WOUND CARE) ×4 IMPLANT
ELECT REM PT RETURN 9FT ADLT (ELECTROSURGICAL) ×2
ELECTRODE REM PT RTRN 9FT ADLT (ELECTROSURGICAL) ×1 IMPLANT
EVACUATOR 1/8 PVC DRAIN (DRAIN) IMPLANT
FACESHIELD WRAPAROUND (MASK) ×6 IMPLANT
GAUZE SPONGE 4X4 12PLY STRL (GAUZE/BANDAGES/DRESSINGS) ×2 IMPLANT
GAUZE XEROFORM 5X9 LF (GAUZE/BANDAGES/DRESSINGS) ×2 IMPLANT
GLOVE BIOGEL PI IND STRL 8 (GLOVE) ×2 IMPLANT
GLOVE BIOGEL PI INDICATOR 8 (GLOVE) ×2
GLOVE ORTHO TXT STRL SZ7.5 (GLOVE) ×4 IMPLANT
GOWN STRL REUS W/ TWL LRG LVL3 (GOWN DISPOSABLE) ×2 IMPLANT
GOWN STRL REUS W/ TWL XL LVL3 (GOWN DISPOSABLE) ×1 IMPLANT
GOWN STRL REUS W/TWL 2XL LVL3 (GOWN DISPOSABLE) ×2 IMPLANT
GOWN STRL REUS W/TWL LRG LVL3 (GOWN DISPOSABLE) ×2
GOWN STRL REUS W/TWL XL LVL3 (GOWN DISPOSABLE) ×1
HANDPIECE INTERPULSE COAX TIP (DISPOSABLE) ×1
IMMOBILIZER KNEE 22 (SOFTGOODS) ×2 IMPLANT
IMMOBILIZER KNEE 22 UNIV (SOFTGOODS) ×2 IMPLANT
KIT BASIN OR (CUSTOM PROCEDURE TRAY) ×2 IMPLANT
KIT ROOM TURNOVER OR (KITS) ×2 IMPLANT
MANIFOLD NEPTUNE II (INSTRUMENTS) ×2 IMPLANT
MARKER SKIN DUAL TIP RULER LAB (MISCELLANEOUS) ×2 IMPLANT
NEEDLE HYPO 25GX1X1/2 BEV (NEEDLE) ×2 IMPLANT
NS IRRIG 1000ML POUR BTL (IV SOLUTION) ×2 IMPLANT
PACK TOTAL JOINT (CUSTOM PROCEDURE TRAY) ×2 IMPLANT
PAD ARMBOARD 7.5X6 YLW CONV (MISCELLANEOUS) ×4 IMPLANT
PAD CAST 4YDX4 CTTN HI CHSV (CAST SUPPLIES) ×1 IMPLANT
PADDING CAST ABS 6INX4YD NS (CAST SUPPLIES) ×1
PADDING CAST ABS COTTON 6X4 NS (CAST SUPPLIES) ×1 IMPLANT
PADDING CAST COTTON 4X4 STRL (CAST SUPPLIES) ×1
PADDING CAST COTTON 6X4 STRL (CAST SUPPLIES) ×2 IMPLANT
SET HNDPC FAN SPRY TIP SCT (DISPOSABLE) ×1 IMPLANT
STAPLER VISISTAT 35W (STAPLE) ×2 IMPLANT
STRIP CLOSURE SKIN 1/2X4 (GAUZE/BANDAGES/DRESSINGS) ×4 IMPLANT
SUCTION FRAZIER HANDLE 10FR (MISCELLANEOUS) ×1
SUCTION TUBE FRAZIER 10FR DISP (MISCELLANEOUS) ×1 IMPLANT
SUT VIC AB 0 CT1 27 (SUTURE) ×1
SUT VIC AB 0 CT1 27XBRD ANBCTR (SUTURE) ×1 IMPLANT
SUT VIC AB 1 CTX 36 (SUTURE) ×2
SUT VIC AB 1 CTX36XBRD ANBCTR (SUTURE) ×2 IMPLANT
SUT VIC AB 2-0 CT1 27 (SUTURE) ×2
SUT VIC AB 2-0 CT1 TAPERPNT 27 (SUTURE) ×2 IMPLANT
SUT VIC AB 3-0 X1 27 (SUTURE) ×2 IMPLANT
SYR CONTROL 10ML LL (SYRINGE) ×2 IMPLANT
TOWEL OR 17X24 6PK STRL BLUE (TOWEL DISPOSABLE) IMPLANT
TOWEL OR 17X26 10 PK STRL BLUE (TOWEL DISPOSABLE) IMPLANT
TRAY CATH 16FR W/PLASTIC CATH (SET/KITS/TRAYS/PACK) IMPLANT
YANKAUER SUCT BULB TIP NO VENT (SUCTIONS) ×4 IMPLANT

## 2016-03-14 NOTE — Progress Notes (Signed)
Placed ABD Pads and ACE wrap over current ACE wrap because it is bleeding.  Placed leg in knee immobilizer.

## 2016-03-14 NOTE — Anesthesia Preprocedure Evaluation (Addendum)
Anesthesia Evaluation  Patient identified by MRN, date of birth, ID band Patient awake    Reviewed: Allergy & Precautions, H&P , NPO status , Patient's Chart, lab work & pertinent test results, reviewed documented beta blocker date and time   Airway Mallampati: III  TM Distance: >3 FB Neck ROM: Full    Dental no notable dental hx. (+) Teeth Intact, Dental Advisory Given   Pulmonary asthma , sleep apnea and Continuous Positive Airway Pressure Ventilation , COPD,  COPD inhaler, former smoker,    Pulmonary exam normal breath sounds clear to auscultation       Cardiovascular hypertension, Pt. on medications and Pt. on home beta blockers +CHF   Rhythm:Regular Rate:Normal     Neuro/Psych Anxiety Depression negative neurological ROS  negative psych ROS   GI/Hepatic Neg liver ROS, GERD  ,  Endo/Other  diabetes, Type 2, Oral Hypoglycemic Agents  Renal/GU Renal InsufficiencyRenal disease  negative genitourinary   Musculoskeletal  (+) Arthritis , Osteoarthritis,    Abdominal   Peds  Hematology negative hematology ROS (+)   Anesthesia Other Findings   Reproductive/Obstetrics negative OB ROS                            Anesthesia Physical Anesthesia Plan  ASA: III  Anesthesia Plan: Spinal   Post-op Pain Management:  Regional for Post-op pain   Induction: Intravenous  Airway Management Planned: Simple Face Mask  Additional Equipment:   Intra-op Plan:   Post-operative Plan:   Informed Consent: I have reviewed the patients History and Physical, chart, labs and discussed the procedure including the risks, benefits and alternatives for the proposed anesthesia with the patient or authorized representative who has indicated his/her understanding and acceptance.   Dental advisory given  Plan Discussed with: CRNA and Surgeon  Anesthesia Plan Comments:         Anesthesia Quick  Evaluation

## 2016-03-14 NOTE — Anesthesia Procedure Notes (Signed)
Spinal  Patient location during procedure: OR Start time: 03/14/2016 12:32 PM End time: 03/14/2016 12:37 PM Staffing Anesthesiologist: Roderic Palau Performed: anesthesiologist  Preanesthetic Checklist Completed: patient identified, surgical consent, pre-op evaluation, timeout performed, IV checked, risks and benefits discussed and monitors and equipment checked Spinal Block Patient position: sitting Prep: DuraPrep Patient monitoring: cardiac monitor, continuous pulse ox and blood pressure Approach: midline Location: L3-4 Injection technique: single-shot Needle Needle type: Pencan  Needle gauge: 24 G Needle length: 9 cm Assessment Sensory level: T8 Additional Notes Functioning IV was confirmed and monitors were applied. Sterile prep and drape, including hand hygiene and sterile gloves were used. The patient was positioned and the spine was prepped. The skin was anesthetized with lidocaine.  Free flow of clear CSF was obtained prior to injecting local anesthetic into the CSF.  The spinal needle aspirated freely following injection.  The needle was carefully withdrawn.  The patient tolerated the procedure well.

## 2016-03-14 NOTE — Progress Notes (Signed)
Messaged Dr. Lorin Mercy office about knee bleeding through ace bandage.

## 2016-03-14 NOTE — Interval H&P Note (Signed)
History and Physical Interval Note:  03/14/2016 12:24 PM  [name]  has presented today for surgery, with the diagnosis of Right Knee Osteoarthritis  The various methods of treatment have been discussed with the patient and family. After consideration of risks, benefits and other options for treatment, the patient has consented to  Procedure(s): RIGHT TOTAL KNEE ARTHROPLASTY (Right) as a surgical intervention .  The patient's history has been reviewed, patient examined, no change in status, stable for surgery.  I have reviewed the patient's chart and labs.  Questions were answered to the patient's satisfaction.     Stacey Page

## 2016-03-14 NOTE — Anesthesia Procedure Notes (Signed)
Anesthesia Regional Block:  Adductor canal block  Pre-Anesthetic Checklist: ,, timeout performed, Correct Patient, Correct Site, Correct Laterality, Correct Procedure, Correct Position, site marked, Risks and benefits discussed, pre-op evaluation,  At surgeon's request and post-op pain management  Laterality: Right  Prep: Maximum Sterile Barrier Precautions used, chloraprep       Needles:  Injection technique: Single-shot  Needle Type: Echogenic Stimulator Needle     Needle Length: 9cm 9 cm Needle Gauge: 21 and 21 G    Additional Needles:  Procedures: ultrasound guided (picture in chart) Adductor canal block Narrative:  Start time: 03/14/2016 11:55 AM End time: 03/14/2016 12:05 PM Injection made incrementally with aspirations every 5 mL. Anesthesiologist: Roderic Palau  Additional Notes: 2% Lidocaine skin wheel.

## 2016-03-14 NOTE — Op Note (Signed)
Preop diagnosis: Right primary knee osteoarthritis   postop diagnosis: Same  Procedure: Right cemented total knee arthroplasty  Surgeon Rodell Perna M.D.  Assistant: Benjiman Core PA-C medically necessary and present for the entire procedure  Tourniquet time less than an hour 350.  Anesthesia is spinal plus abductor block.  Implants: Depuy  Attune #7 femur with lugs 5 mm rotating platform, #7 tibia. 41 mm patella.  Procedure: After induction of spinal anesthesia standard prepping draping proximal thigh tourniquet lateral post heel bump DuraPrep was used from the tourniquet to the tip the toes usual impervious stockinette Caban sterile skin marker Betadine Steri-Drape split sheets and drapes were all applied Ancef was given prophylactically in timeout procedure was performed.  Leg was wrapped an Esmarch tourniquet inflated. Midline incision was made medial parapatellar incision was made patella was flipped over 10 mm resected off the patella. The trail  size 41 holes fit well and was drilled. There was tricompartmental degenerative arthritis with 1-2 cm spurs in all 3 compartments. Spurs were trimmed off intramedullary rod was placed after drill left femur and 10 mm cut was made on the femur. 9 was resected on the tibia AP cuts are made and the 5 mm spacer block allowed full extension and good collateral balance. Chamfer cuts box cuts were made three-quarter osteotome to used to remove some large posterior spurs off the femur worse on the medial the lateral side. He'll preparation on the tibia insertion of trials with good stability. Pulse lavage vacuum mixing of the cement. Tibia cemented first followed by the femur placement of the Polly and then the patellar component all 3 held with cement. All excessive cement been removed. Cement was hard at 15 minutes. Tourniquet was deflated hemostasis was obtained. Standard layer closure

## 2016-03-14 NOTE — Transfer of Care (Signed)
Immediate Anesthesia Transfer of Care Note  Patient: Stacey Page  Procedure(s) Performed: Procedure(s): RIGHT TOTAL KNEE ARTHROPLASTY (Right)  Patient Location: PACU  Anesthesia Type:Regional and MAC combined with regional for post-op pain  Level of Consciousness: awake, alert , oriented and patient cooperative  Airway & Oxygen Therapy: Patient Spontanous Breathing and Patient connected to face mask oxygen  Post-op Assessment: Report given to RN and Post -op Vital signs reviewed and stable  Post vital signs: Reviewed and stable  Last Vitals:  Vitals:   03/14/16 1222 03/14/16 1223  BP:  (!) 132/94  Pulse: (!) 52 (!) 53  Resp: 10 12  Temp:      Last Pain:  Vitals:   03/14/16 1045  TempSrc: Oral         Complications: No apparent anesthesia complications

## 2016-03-14 NOTE — Anesthesia Procedure Notes (Addendum)
Procedure Name: MAC Date/Time: 03/14/2016 12:33 PM Performed by: Lance Coon Pre-anesthesia Checklist: Patient identified, Emergency Drugs available, Suction available, Patient being monitored and Timeout performed Patient Re-evaluated:Patient Re-evaluated prior to inductionOxygen Delivery Method: Simple face mask

## 2016-03-14 NOTE — Anesthesia Postprocedure Evaluation (Signed)
Anesthesia Post Note  Patient: Janiyha Montufar  Procedure(s) Performed: Procedure(s) (LRB): RIGHT TOTAL KNEE ARTHROPLASTY (Right)  Patient location during evaluation: PACU Anesthesia Type: Spinal and Regional Level of consciousness: awake and alert Pain management: pain level controlled Vital Signs Assessment: post-procedure vital signs reviewed and stable Respiratory status: spontaneous breathing and respiratory function stable Cardiovascular status: blood pressure returned to baseline and stable Postop Assessment: spinal receding Anesthetic complications: no       Last Vitals:  Vitals:   03/14/16 1610 03/14/16 1625  BP: 128/60 (!) 146/70  Pulse: (!) 48 (!) 51  Resp: 12 15  Temp:      Last Pain:  Vitals:   03/14/16 1625  TempSrc:   PainSc: Asleep    LLE Motor Response: Purposeful movement;Responds to commands (03/14/16 1625) LLE Sensation: Decreased (03/14/16 1625) RLE Motor Response: Purposeful movement;Responds to commands (03/14/16 1625) RLE Sensation: Decreased (03/14/16 1625) L Sensory Level: S1-Sole of foot, small toes (03/14/16 1625) R Sensory Level: S1-Sole of foot, small toes (03/14/16 1625)  Sheli Dorin,W. EDMOND

## 2016-03-14 NOTE — Progress Notes (Signed)
Orthopedic Tech Progress Note Patient Details:  Stacey Page Jun 25, 1950 [id number]  CPM Right Knee CPM Right Knee: On Right Knee Flexion (Degrees): 90 Right Knee Extension (Degrees): 0 Additional Comments: foot roll   [name] 03/14/2016, 4:04 PM

## 2016-03-14 NOTE — H&P (Signed)
TOTAL KNEE ADMISSION H&P  Patient is being admitted for right total knee arthroplasty.  Subjective:  Chief Complaint:right knee pain.  HPI: Stacey Page, 66 y.o. female, has a history of pain and functional disability in the right knee due to arthritis and has failed non-surgical conservative treatments for greater than 12 weeks to includeNSAID's and/or analgesics, corticosteriod injections, use of assistive devices and activity modification.  Onset of symptoms was gradual, starting 10 years ago with gradually worsening course since that time.   Patient currently rates pain in the right knee(s) at 10 out of 10 with activity. Patient has worsening of pain with activity and weight bearing, pain that interferes with activities of daily living, pain with passive range of motion, crepitus and joint swelling.  Patient has evidence of subchondral sclerosis, periarticular osteophytes and joint space narrowing by imaging studies.. There is no active infection.  Patient Active Problem List   Diagnosis Date Noted  . Hyperlipidemia LDL goal <70 12/15/2015  . Paresthesia of both feet 11/19/2015  . Pain in both lower extremities 11/19/2015  . Numbness in both hands 08/13/2015  . Panic attack 06/09/2015  . Right knee pain 05/14/2015  . Chronic renal insufficiency 09/16/2014  . Pneumothorax on right   . Atypical mycobacterium infection   . ILD (interstitial lung disease) ([place])   . Routine general medical examination at a health care facility 04/03/2014  . Morbid obesity ([place]) 04/03/2014  . OSA (obstructive sleep apnea) 02/25/2014  . Chronic diastolic heart failure ([place]) 02/25/2014  . Controlled steroid-induced diabetes mellitus ([place]) 02/25/2014  . Essential hypertension 02/25/2014   Past Medical History:  Diagnosis Date  . Anxiety    doesn't take any meds  . Asthma   . CHF (congestive heart failure) (HCC)    takes Furosemide daily  . COPD (chronic obstructive pulmonary disease) (HCC)     Albuterol as needed  . Depression    doesn't take meds  . Diabetes ([place])    takes Januvia daily  . Eczema    uses cream as needed  . GERD (gastroesophageal reflux disease)   . History of bronchitis as a child   . HTN (hypertension)    takes Lisinopril and Labetalol daily  . Insomnia   . Joint pain   . Joint swelling   . OA (osteoarthritis)   . OSA (obstructive sleep apnea) 02/25/2014   wears CPAP at night  . Peripheral neuropathy (HCC)    takes Gabapentin as needed  . Pneumonia    hx of-2010  . Seasonal allergies    uses Flonase daily    Past Surgical History:  Procedure Laterality Date  . CESAREAN SECTION  x2  . COLONOSCOPY    . KNEE ARTHROSCOPY Right   . LUNG BIOPSY Right 06/10/2014   Procedure: LUNG BIOPSY;  Surgeon: Ivin Poot, MD;  Location: [facility];  Service: Thoracic;  Laterality: Right;  . POLYPECTOMY     throat  . VIDEO ASSISTED THORACOSCOPY Right 06/10/2014   Procedure: VIDEO ASSISTED THORACOSCOPY;  Surgeon: Ivin Poot, MD;  Location: [facility];  Service: Thoracic;  Laterality: Right;    Prescriptions Prior to Admission  Medication Sig Dispense Refill Last Dose  . Clobetasol Prop Emollient Base (CLOBETASOL PROPIONATE E) 0.05 % emollient cream Apply 1 application topically daily as needed (skin irritations). 30 g 2 Taking  . fluticasone (FLONASE) 50 MCG/ACT nasal spray PLACE 1 SPRAY INTO BOTH NOSTRILS DAILY. 16 g 2 Taking  . furosemide (LASIX) 40 MG tablet TAKE 1  TABLET (40 MG TOTAL) BY MOUTH DAILY. (Patient taking differently: Take 40 mg by mouth daily. ) 90 tablet 1 Taking  . gabapentin (NEURONTIN) 300 MG capsule Take 1 capsule (300 mg total) by mouth 3 (three) times daily. (Patient taking differently: Take 300 mg by mouth 2 (two) times daily as needed. ) 90 capsule 3 Taking  . labetalol (NORMODYNE) 200 MG tablet Take 1 tablet (200 mg total) by mouth 2 (two) times daily. 180 tablet 1 Taking  . lisinopril (PRINIVIL,ZESTRIL) 20 MG tablet Take 1 tablet (20 mg total)  by mouth daily. 90 tablet 1 Taking  . montelukast (SINGULAIR) 10 MG tablet TAKE 1 TABLET (10 MG TOTAL) BY MOUTH AT BEDTIME. (Patient taking differently: Take 10 mg by mouth at bedtime. ) 90 tablet 1 Taking  . naproxen sodium (ANAPROX) 220 MG tablet Take 220 mg by mouth 2 (two) times daily as needed (PAIN).   Taking  . potassium citrate (UROCIT-K) 10 MEQ (1080 MG) SR tablet Take 1 tablet (10 mEq total) by mouth daily. 90 tablet 1 Taking  . sitaGLIPtin (JANUVIA) 100 MG tablet Take 1 tablet (100 mg total) by mouth daily. 30 tablet 3 Taking  . albuterol (PROAIR HFA) 108 (90 Base) MCG/ACT inhaler Inhale 2 puffs into the lungs every 6 (six) hours as needed for wheezing or shortness of breath. 1 Inhaler 3   . ALPRAZolam (XANAX) 0.25 MG tablet Take 1 tablet (0.25 mg total) by mouth 2 (two) times daily as needed for anxiety. 30 tablet 1 Taking  . atorvastatin (LIPITOR) 20 MG tablet Take 1 tablet (20 mg total) by mouth daily at 6 PM. 90 tablet 1 Taking  . furosemide (LASIX) 80 MG tablet TAKE 1/2 TABLET BY MOUTH EVERY DAY 45 tablet 0 Taking  . HYDROcodone-acetaminophen (NORCO) 7.5-325 MG tablet Take 1 tablet by mouth 2 (two) times daily as needed for moderate pain. 60 tablet 0 Taking  . lisinopril (PRINIVIL,ZESTRIL) 20 MG tablet TAKE 1 TABLET BY MOUTH EVERY DAY 90 tablet 3 Taking  . Suvorexant (BELSOMRA) 10 MG TABS Take 10 mg by mouth at bedtime. 30 tablet 5 Taking   Allergies  Allergen Reactions  . No Known Allergies     Social History  Substance Use Topics  . Smoking status: Former Smoker    Packs/day: 0.25    Years: 15.00    Types: Cigarettes    Quit date: 02/08/1980  . Smokeless tobacco: Former Systems developer     Comment: 2 packs per week  . Alcohol use 0.0 oz/week     Comment: occassional/social/rare    Family History  Problem Relation Age of Onset  . COPD Father   . Hypothyroidism Father   . Anemia Father     iron deficiency  . Cancer Mother 16    pancreatic     Review of Systems   Constitutional: Negative.   HENT: Negative.   Eyes: Negative.   Cardiovascular: Negative.   Gastrointestinal: Negative.   Genitourinary: Negative.   Musculoskeletal: Positive for joint pain.  Skin: Negative.   Neurological: Negative.   Psychiatric/Behavioral: Negative.     Objective:  Physical Exam  Constitutional: She is oriented to person, place, and time. She appears well-developed. No distress.  HENT:  Head: Normocephalic.  Eyes: EOM are normal. Pupils are equal, round, and reactive to light.  Neck: Normal range of motion.  Respiratory: Effort normal. No respiratory distress.  GI: She exhibits no distension.  Musculoskeletal: She exhibits tenderness.  Neurological: She is alert and oriented to  person, place, and time.  Skin: Skin is warm and dry.  Psychiatric: She has a normal mood and affect.    Vital signs in last 24 hours: Temp:  [97.9 F (36.6 C)] 97.9 F (36.6 C) (02/05 1045) Pulse Rate:  [64] 64 (02/05 1045) Resp:  [18] 18 (02/05 1045) BP: (154)/(58) 154/58 (02/05 1045) SpO2:  [100 %] 100 % (02/05 1045) Weight:  [250 lb (113.4 kg)] 250 lb (113.4 kg) (02/05 1045)  Labs:   Estimated body mass index is 38.01 kg/m (pended) as calculated from the following:   Height as of 03/07/16: (P) 5\' 8"  (1.727 m).   Weight as of this encounter: 250 lb (113.4 kg).   Imaging Review Plain radiographs demonstrate moderate degenerative joint disease of the right knee(s). The overall alignment ismild varus. The bone quality appears to be good for age and reported activity level.  Assessment/Plan:  End stage arthritis, right knee   The patient history, physical examination, clinical judgment of the provider and imaging studies are consistent with end stage degenerative joint disease of the right knee(s) and total knee arthroplasty is deemed medically necessary. The treatment options including medical management, injection therapy arthroscopy and arthroplasty were discussed at  length. The risks and benefits of total knee arthroplasty were presented and reviewed. The risks due to aseptic loosening, infection, stiffness, patella tracking problems, thromboembolic complications and other imponderables were discussed. The patient acknowledged the explanation, agreed to proceed with the plan and consent was signed. Patient is being admitted for inpatient treatment for surgery, pain control, PT, OT, prophylactic antibiotics, VTE prophylaxis, progressive ambulation and ADL's and discharge planning. The patient is planning to be discharged home with home health services

## 2016-03-15 ENCOUNTER — Encounter (HOSPITAL_COMMUNITY): Payer: Self-pay | Admitting: Orthopaedic Surgery

## 2016-03-15 LAB — CBC
HCT: 29.6 % — ABNORMAL LOW (ref 36.0–46.0)
Hemoglobin: 9.6 g/dL — ABNORMAL LOW (ref 12.0–15.0)
MCH: 28.5 pg (ref 26.0–34.0)
MCHC: 32.4 g/dL (ref 30.0–36.0)
MCV: 87.8 fL (ref 78.0–100.0)
PLATELETS: 198 10*3/uL (ref 150–400)
RBC: 3.37 MIL/uL — ABNORMAL LOW (ref 3.87–5.11)
RDW: 14.3 % (ref 11.5–15.5)
WBC: 6.3 10*3/uL (ref 4.0–10.5)

## 2016-03-15 LAB — BASIC METABOLIC PANEL
Anion gap: 8 (ref 5–15)
BUN: 19 mg/dL (ref 6–20)
CALCIUM: 8.9 mg/dL (ref 8.9–10.3)
CO2: 23 mmol/L (ref 22–32)
CREATININE: 1.34 mg/dL — AB (ref 0.44–1.00)
Chloride: 103 mmol/L (ref 101–111)
GFR calc Af Amer: 47 mL/min — ABNORMAL LOW (ref >60)
GFR calc non Af Amer: 41 mL/min — ABNORMAL LOW (ref >60)
Glucose, Bld: 124 mg/dL — ABNORMAL HIGH (ref 65–99)
Potassium: 4.7 mmol/L (ref 3.5–5.1)
SODIUM: 134 mmol/L — AB (ref 135–145)

## 2016-03-15 LAB — GLUCOSE, CAPILLARY
GLUCOSE-CAPILLARY: 100 mg/dL — AB (ref 65–99)
Glucose-Capillary: 113 mg/dL — ABNORMAL HIGH (ref 65–99)
Glucose-Capillary: 85 mg/dL (ref 65–99)
Glucose-Capillary: 96 mg/dL (ref 65–99)
Glucose-Capillary: 131 mg/dL — ABNORMAL HIGH (ref 65–99)

## 2016-03-15 MED ORDER — KETOROLAC TROMETHAMINE 30 MG/ML IJ SOLN
30.0000 mg | Freq: Four times a day (QID) | INTRAMUSCULAR | Status: DC | PRN
  Administered 2016-03-15 – 2016-03-16 (×3): 30 mg via INTRAVENOUS
  Filled 2016-03-15 (×3): qty 1

## 2016-03-15 MED ORDER — SODIUM CHLORIDE 0.45 % IV BOLUS
400.0000 mL | Freq: Once | INTRAVENOUS | Status: AC
  Administered 2016-03-15: 400 mL via INTRAVENOUS

## 2016-03-15 NOTE — Progress Notes (Addendum)
   Subjective: 1 Day Post-Op Procedure(s) (LRB): RIGHT TOTAL KNEE ARTHROPLASTY (Right) Patient reports pain as moderate and severe.  Complaining of headache . Had spinal anesthesia.   Objective: Vital signs in last 24 hours: Temp:  [97.5 F (36.4 C)-98.8 F (37.1 C)] 98.8 F (37.1 C) (02/06 0528) Pulse Rate:  [48-68] 64 (02/06 0528) Resp:  [0-25] 16 (02/06 0528) BP: (84-174)/(36-94) 158/53 (02/06 0528) SpO2:  [84 %-100 %] 96 % (02/06 0528) Weight:  [250 lb (113.4 kg)] 250 lb (113.4 kg) (02/05 1045)  Intake/Output from previous day: 02/05 0701 - 02/06 0700 In: 1771.5 [P.O.:240; I.V.:1531.5] Out: 150 [Blood:150] Intake/Output this shift: No intake/output data recorded.   Recent Labs  03/15/16 0513  HGB 9.6*    Recent Labs  03/15/16 0513  WBC 6.3  RBC 3.37*  HCT 29.6*  PLT 198    Recent Labs  03/15/16 0513  NA 134*  K 4.7  CL 103  CO2 23  BUN 19  CREATININE 1.34*  GLUCOSE 124*  CALCIUM 8.9   No results for input(s): LABPT, INR in the last 72 hours.  Neurologically intact.  Bloody dressing.  Dg Knee 1-2 Views Right  Result Date: 03/14/2016 CLINICAL DATA:  Right knee arthroplasty. EXAM: RIGHT KNEE - 1-2 VIEW COMPARISON:  Right knee radiographs 05/27/2015. FINDINGS: A right total knee arthroplasty is noted. Right-sided joint effusion is present, as expected. Joint spacing is symmetric. IMPRESSION: Right total knee arthroplasty without radiographic evidence for complication. Electronically Signed   By: San Morelle M.D.   On: 03/14/2016 15:55    Assessment/Plan: 1 Day Post-Op Procedure(s) (LRB): RIGHT TOTAL KNEE ARTHROPLASTY (Right) Up with therapy, change dressing.   Stacey Page 03/15/2016, 7:48 AM

## 2016-03-15 NOTE — Progress Notes (Signed)
Pt. Has her own cpap from home and states she does not need any assistance at this time.

## 2016-03-15 NOTE — NC FL2 (Signed)
[place] MEDICAID FL2 LEVEL OF CARE SCREENING TOOL     IDENTIFICATION  Patient Name: Stacey Page Birthdate: [birth year] Sex: female Admission Date (Current Location): 03/14/2016  [facility] [place] Number:  Herbalist and Address:  [facility], [place] [address], [place], [place] [address]      Provider Number: [id number]  Attending Physician Name and Address:  Marybelle Killings, MD  Relative Name and Phone Number:       Current Level of Care: Hospital Recommended Level of Care: [place] Prior Approval Number:    Date Approved/Denied:   PASRR Number: [id number]  Discharge Plan: SNF    Current Diagnoses: Patient Active Problem List   Diagnosis Date Noted  . Arthritis of right knee 03/14/2016  . Primary osteoarthritis of right knee   . Hyperlipidemia LDL goal <70 12/15/2015  . Paresthesia of both feet 11/19/2015  . Pain in both lower extremities 11/19/2015  . Numbness in both hands 08/13/2015  . Panic attack 06/09/2015  . Right knee pain 05/14/2015  . Chronic renal insufficiency 09/16/2014  . Pneumothorax on right   . Atypical mycobacterium infection   . ILD (interstitial lung disease) ([place])   . Routine general medical examination at a health care facility 04/03/2014  . Morbid obesity ([place]) 04/03/2014  . OSA (obstructive sleep apnea) 02/25/2014  . Chronic diastolic heart failure ([place]) 02/25/2014  . Controlled steroid-induced diabetes mellitus ([place]) 02/25/2014  . Essential hypertension 02/25/2014    Orientation RESPIRATION BLADDER Height & Weight     Self, Time, Situation, Place  O2 (2L) Continent Weight: 250 lb (113.4 kg) Height:     BEHAVIORAL SYMPTOMS/MOOD NEUROLOGICAL BOWEL NUTRITION STATUS      Continent  (Carb Modified, Thin Liquids)  AMBULATORY STATUS COMMUNICATION OF NEEDS Skin   Limited Assist Verbally Normal                       Personal Care Assistance Level of Assistance  Bathing,  Dressing, Feeding Bathing Assistance: Limited assistance Feeding assistance: Independent Dressing Assistance: Limited assistance     Functional Limitations Info  Sight, Hearing, Speech Sight Info: Adequate Hearing Info: Adequate Speech Info: Adequate    SPECIAL CARE FACTORS FREQUENCY  PT (By licensed PT), OT (By licensed OT)     PT Frequency: 5 OT Frequency: 5            Contractures Contractures Info: Not present    Additional Factors Info  Code Status, Allergies Code Status Info: Full Code Allergies Info: No Known Allergies           Current Medications (03/15/2016):  This is the current hospital active medication list Current Facility-Administered Medications  Medication Dose Route Frequency Provider Last Rate Last Dose  . 0.9 %  sodium chloride infusion   Intravenous Continuous Lanae Crumbly, PA-C 90 mL/hr at 03/14/16 1739    . acetaminophen (TYLENOL) tablet 650 mg  650 mg Oral Q6H PRN Lanae Crumbly, PA-C       Or  . acetaminophen (TYLENOL) suppository 650 mg  650 mg Rectal Q6H PRN Lanae Crumbly, PA-C      . albuterol (PROVENTIL) (2.5 MG/3ML) 0.083% nebulizer solution 2.5 mg  2.5 mg Nebulization Q6H PRN Marybelle Killings, MD      . ALPRAZolam Duanne Moron) tablet 0.25 mg  0.25 mg Oral BID PRN Lanae Crumbly, PA-C      . aspirin EC tablet 325 mg  325  mg Oral Q breakfast Lanae Crumbly, PA-C   325 mg at 03/15/16 4235  . atorvastatin (LIPITOR) tablet 20 mg  20 mg Oral q1800 Lanae Crumbly, PA-C      . docusate sodium (COLACE) capsule 100 mg  100 mg Oral BID Lanae Crumbly, PA-C   100 mg at 03/15/16 3614  . fluticasone (FLONASE) 50 MCG/ACT nasal spray 1 spray  1 spray Each [name] Daily Lanae Crumbly, PA-C   1 spray at 03/15/16 4315  . furosemide (LASIX) tablet 40 mg  40 mg Oral Daily Lanae Crumbly, PA-C   40 mg at 03/15/16 4008  . gabapentin (NEURONTIN) capsule 300 mg  300 mg Oral BID PRN Lanae Crumbly, PA-C      . HYDROmorphone (DILAUDID) injection 1 mg  1 mg Intravenous Q3H PRN Lanae Crumbly, PA-C      . insulin aspart (novoLOG) injection 0-20 Units  0-20 Units Subcutaneous TID WC Lanae Crumbly, PA-C      . ketorolac (TORADOL) 30 MG/ML injection 30 mg  30 mg Intravenous Q6H PRN Marybelle Killings, MD   30 mg at 03/15/16 6761  . labetalol (NORMODYNE) tablet 200 mg  200 mg Oral BID Lanae Crumbly, PA-C   200 mg at 03/15/16 9509  . lactated ringers infusion   Intravenous Continuous Roderic Palau, MD 10 mL/hr at 03/14/16 1115    . linagliptin (TRADJENTA) tablet 5 mg  5 mg Oral Daily Lanae Crumbly, PA-C   5 mg at 03/15/16 3267  . lisinopril (PRINIVIL,ZESTRIL) tablet 20 mg  20 mg Oral Daily Lanae Crumbly, PA-C   20 mg at 03/15/16 1245  . menthol-cetylpyridinium (CEPACOL) lozenge 3 mg  1 lozenge Oral PRN Lanae Crumbly, PA-C       Or  . phenol (CHLORASEPTIC) mouth spray 1 spray  1 spray Mouth/Throat PRN Lanae Crumbly, PA-C      . methocarbamol (ROBAXIN) tablet 500 mg  500 mg Oral Q6H PRN Lanae Crumbly, PA-C   500 mg at 03/15/16 8099   Or  . methocarbamol (ROBAXIN) 500 mg in dextrose 5 % 50 mL IVPB  500 mg Intravenous Q6H PRN Lanae Crumbly, PA-C      . metoCLOPramide (REGLAN) tablet 5-10 mg  5-10 mg Oral Q8H PRN Lanae Crumbly, PA-C       Or  . metoCLOPramide (REGLAN) injection 5-10 mg  5-10 mg Intravenous Q8H PRN Lanae Crumbly, PA-C      . montelukast (SINGULAIR) tablet 10 mg  10 mg Oral QHS Lanae Crumbly, PA-C   10 mg at 03/14/16 2212  . ondansetron (ZOFRAN) tablet 4 mg  4 mg Oral Q6H PRN Lanae Crumbly, PA-C       Or  . ondansetron [facility]) injection 4 mg  4 mg Intravenous Q6H PRN Lanae Crumbly, PA-C   4 mg at 03/14/16 1813  . oxyCODONE (Oxy IR/ROXICODONE) immediate release tablet 5-10 mg  5-10 mg Oral Q4H PRN Lanae Crumbly, PA-C   10 mg at 03/15/16 1028  . polyethylene glycol (MIRALAX / GLYCOLAX) packet 17 g  17 g Oral Daily PRN Lanae Crumbly, PA-C      . potassium citrate (UROCIT-K) SR tablet 10 mEq  10 mEq Oral Daily Lanae Crumbly, PA-C   10 mEq at 03/15/16 8338     Discharge  Medications: Please see discharge summary for a list of discharge medications.  Relevant Imaging Results:  Relevant Lab Results:   Additional Information SSN:  [id number]  Darden Dates, LCSW

## 2016-03-15 NOTE — Evaluation (Signed)
Occupational Therapy Evaluation Patient Details Name: Stacey Page MRN: [id number] DOB: [birth year] Today's Date: 03/15/2016    History of Present Illness 66 y.o. female admitted to [facility] on 03/14/16 for elective R TKA.  Pt with significant PMHx of peripheral neuropathy, HTN, DM, COPD, CHF, and R lung biopsy.     Clinical Impression   PTA, pt was independent with cane and rollator for functional mobility and ADL but reports was unable to step into her tub to shower. Pt currently requires mod assist for stand-pivot toilet transfers and LB ADL. Pt limited on evaluation due to lightheadedness/dizziness and BP was low (100/50 manually after reclining). RN monitoring and notified Md. Pt demonstrates significant functional decline at this time and would benefit from short-term SNF placement in order to improve independence with ADL and maximize return to PLOF. OT will continue to follow acutely with a focus on LB ADL and ADL transfers.    Follow Up Recommendations  SNF;Supervision/Assistance - 24 hour    Equipment Recommendations  Other (comment) (TBD at next venue of care.)    Recommendations for Other Services       Precautions / Restrictions Precautions Precautions: Knee Precaution Booklet Issued: Yes (comment) Precaution Comments: Reviewed knee precautions during ADL Required Braces or Orthoses: Knee Immobilizer - Right Knee Immobilizer - Right: On except when in CPM Restrictions Weight Bearing Restrictions: No RLE Weight Bearing: Weight bearing as tolerated      Mobility Bed Mobility Overal bed mobility: Needs Assistance Bed Mobility: Sit to Supine     Supine to sit: Max assist     General bed mobility comments: Max assist to raise legs into bed as pt reporting lightheadedness.  Transfers Overall transfer level: Needs assistance Equipment used: Rolling walker (2 wheeled) Transfers: Sit to/from Stand Sit to Stand: Min assist;Mod assist         General transfer  comment: Min assist on initial trial but mod assist on subsequent. VC's for technique.    Balance Overall balance assessment: Needs assistance Sitting-balance support: Feet supported;Bilateral upper extremity supported Sitting balance-Leahy Scale: Good     Standing balance support: Bilateral upper extremity supported Standing balance-Leahy Scale: Poor                              ADL Overall ADL's : Needs assistance/impaired     Grooming: Set up;Sitting   Upper Body Bathing: Min guard;Sitting   Lower Body Bathing: Moderate assistance;Sit to/from stand   Upper Body Dressing : Min guard;Sitting   Lower Body Dressing: Moderate assistance;Sit to/from stand   Toilet Transfer: Moderate assistance;Stand-pivot;RW Toilet Transfer Details (indicate cue type and reason): Increased time for movement. Toileting- Clothing Manipulation and Hygiene: Minimal assistance;Sit to/from stand         General ADL Comments: Pt limited this session by feelings of lightheadedness and low BP. Able to complete toilet stand-pivot transfer with mod assist and increased time.     Vision Vision Assessment?: No apparent visual deficits   Perception     Praxis      Pertinent Vitals/Pain Pain Assessment: Faces Pain Score: 6  Faces Pain Scale: Hurts whole lot Pain Location: right knee Pain Descriptors / Indicators: Aching;Burning Pain Intervention(s): Limited activity within patient's tolerance;Monitored during session;Repositioned     Hand Dominance Right   Extremity/Trunk Assessment Upper Extremity Assessment Upper Extremity Assessment: Generalized weakness   Lower Extremity Assessment Lower Extremity Assessment: RLE deficits/detail RLE Deficits / Details: Decreased strength and AROM  as expected post-operatively.   Cervical / Trunk Assessment Cervical / Trunk Assessment: Normal   Communication Communication Communication: No difficulties   Cognition Arousal/Alertness:  Lethargic Behavior During Therapy: WFL for tasks assessed/performed Overall Cognitive Status: Within Functional Limits for tasks assessed                     General Comments       Exercises Exercises: Total Joint     Shoulder Instructions      Home Living Family/patient expects to be discharged to:: Private residence Living Arrangements: Children Available Help at Discharge: Family;Available 24 hours/day;Other (Comment) (daughter works from home) Type of Home: House Home Access: Stairs to enter [employer] of Steps: 2 (spread apart) Entrance Stairs-Rails: None Home Layout: One level     Bathroom Shower/Tub: Teacher, early years/pre: Standard     Home Equipment: Environmental consultant - 4 wheels;Transport chair;Cane - quad          Prior Functioning/Environment Level of Independence: Independent with assistive device(s)        Comments: used a cane and at times the rollator for gait        OT Problem List: Decreased strength;Decreased range of motion;Decreased activity tolerance;Impaired balance (sitting and/or standing);Decreased safety awareness;Decreased knowledge of use of DME or AE;Decreased knowledge of precautions;Pain   OT Treatment/Interventions: Self-care/ADL training;Therapeutic exercise;Therapeutic activities;Patient/family education;Balance training;DME and/or AE instruction;Energy conservation    OT Goals(Current goals can be found in the care plan section) Acute Rehab OT Goals Patient Stated Goal: to go to rehab before going home so she can be more independent.  OT Goal Formulation: With patient Time For Goal Achievement: 03/22/16 Potential to Achieve Goals: Good ADL Goals Pt Will Perform Lower Body Bathing: sit to/from stand;with supervision Pt Will Perform Lower Body Dressing: sit to/from stand;with supervision Pt Will Transfer to Toilet: with supervision;ambulating;bedside commode (BSC over toilet) Pt Will Perform Toileting -  Clothing Manipulation and hygiene: with supervision;sit to/from stand Pt Will Perform Tub/Shower Transfer: ambulating;3 in 1;rolling walker;with min guard assist  OT Frequency: Min 2X/week   Barriers to D/C:            Co-evaluation              End of Session Equipment Utilized During Treatment: Gait belt;Rolling walker Nurse Communication: Mobility status;Other (comment) (Low BP)  Activity Tolerance: Patient tolerated treatment well Patient left: in bed;with call bell/phone within reach;with nursing/sitter in room   Time: 1328-1409 OT Time Calculation (min): 41 min Charges:  OT General Charges $OT Visit: 1 Procedure OT Evaluation $OT Eval Moderate Complexity: 1 Procedure OT Treatments $Self Care/Home Management : 23-37 mins G-Codes:    Norman Herrlich, OTR/L [phone] 03/15/2016, 2:24 PM

## 2016-03-15 NOTE — Progress Notes (Signed)
Offered Pt a bath. Pt stated she would not like a bath at this time, but requested for tech to scratch her back. Tech washed and rubbed Pt's. Back and applied lotion.

## 2016-03-15 NOTE — Progress Notes (Signed)
Called Dr. Lorin Mercy earlier because patient's BP was low.  Gave bolus, pressure came up. Will continue to monitor and will relay to night team.

## 2016-03-15 NOTE — Progress Notes (Signed)
Physical Therapy Treatment Patient Details Name: Stacey Page MRN: [id number] DOB: 03/31/1950 Today's Date: 03/15/2016    History of Present Illness 66 y.o. female admitted to [facility] on 03/14/16 for elective R TKA.  Pt with significant PMHx of peripheral neuropathy, HTN, DM, COPD, CHF, and R lung biopsy.      PT Comments    Pt is POD #1 and is limited this session by lightheadedness in standing.  She felt better initially when sitting, but after being on the [facility] and returning to standing she had a near-syncopal feeling.  BP, HR, and O2 were better 120s/40s (compared to 100s/50 earlier).  Pt was positioned back in bed, we reviewed more of her exercises and I placed her in her CPM.    Follow Up Recommendations  SNF     Equipment Recommendations  None recommended by PT    Recommendations for Other Services   NA     Precautions / Restrictions Precautions Precautions: Knee Precaution Booklet Issued: Yes (comment) Precaution Comments: knee exercis handout given, no pillow under operative knee reviewed Required Braces or Orthoses: Knee Immobilizer - Right Knee Immobilizer - Right: On except when in CPM Restrictions Weight Bearing Restrictions: No RLE Weight Bearing: Weight bearing as tolerated    Mobility  Bed Mobility Overal bed mobility: Needs Assistance Bed Mobility: Supine to Sit;Sit to Supine     Supine to sit: Min assist;HOB elevated Sit to supine: Min assist   General bed mobility comments: Min assist to help progress right leg to EOB and weight shift hips to get to sitting.  Min assist to help lift right leg back into the bed to go to supine.   Transfers Overall transfer level: Needs assistance Equipment used: Rolling walker (2 wheeled) Transfers: Sit to/from Stand Sit to Stand: Mod assist;From elevated surface         General transfer comment: Mod assist to support trunk to get to standing.  Veral cues for safe hand placement and technique. Mod assist from both  bed and BSC x 2.    Ambulation/Gait             General Gait Details: Pt was only able to pivot to the [facility] and back to bed.  Limited by lightheadedness.           Balance Overall balance assessment: Needs assistance Sitting-balance support: Feet supported;Bilateral upper extremity supported Sitting balance-Leahy Scale: Good     Standing balance support: Bilateral upper extremity supported Standing balance-Leahy Scale: Poor                      Cognition Arousal/Alertness: Awake/alert Behavior During Therapy: WFL for tasks assessed/performed Overall Cognitive Status: Within Functional Limits for tasks assessed                      Exercises Total Joint Exercises Short Arc QuadSinclair Ship;Right;10 reps Hip ABduction/ADduction: AAROM;Right;10 reps Straight Leg Raises: AAROM;Right;10 reps    General Comments General comments (skin integrity, edema, etc.): Pt was able to preform her own peri care with min assist for balance in standing.       Pertinent Vitals/Pain Pain Assessment: 0-10 Pain Score: 7  Faces Pain Scale: Hurts whole lot Pain Location: right knee Pain Descriptors / Indicators: Aching;Burning Pain Intervention(s): Limited activity within patient's tolerance;Monitored during session;Repositioned    Home Living Family/patient expects to be discharged to:: Private residence Living Arrangements: Children Available Help at Discharge: Family;Available 24 hours/day;Other (Comment) (daughter works from home)  Type of Home: House Home Access: Stairs to enter Entrance Stairs-Rails: None Home Layout: One level Home Equipment: Environmental consultant - 4 wheels;Transport chair;Cane - quad      Prior Function Level of Independence: Independent with assistive device(s)      Comments: used a cane and at times the rollator for gait   PT Goals (current goals can now be found in the care plan section) Acute Rehab PT Goals Patient Stated Goal: to go to rehab before  going home so she can be more independent.  Progress towards PT goals: Progressing toward goals    Frequency    7X/week      PT Plan Current plan remains appropriate       End of Session Equipment Utilized During Treatment: Right knee immobilizer Activity Tolerance: Patient limited by pain;Patient limited by fatigue Patient left: in chair;with call bell/phone within reach     Time: 1449-1524 PT Time Calculation (min) (ACUTE ONLY): 35 min  Charges:  $Therapeutic Exercise: 8-22 mins $Therapeutic Activity: 8-22 mins                      Cillian Gwinner B. [place], [place], DPT [phone]   03/15/2016, 3:29 PM

## 2016-03-15 NOTE — Plan of Care (Signed)
Problem: Nutrition: Goal: Adequate nutrition will be maintained Outcome: Progressing Tolerated diet well  Problem: Bowel/Gastric: Goal: Will not experience complications related to bowel motility Outcome: Progressing Denies gastric and bowel issues  Problem: Activity: Goal: Will remain free from falls Outcome: Progressing No fall or injury noted, safety precautions maintained,   Problem: Pain Management: Goal: Pain level will decrease with appropriate interventions Outcome: Progressing Medicated once this shift for pain, patient is resting in the bed with eyes closed at present moment

## 2016-03-15 NOTE — Clinical Social Work Placement (Addendum)
   CLINICAL SOCIAL WORK PLACEMENT  NOTE  Date:  03/15/2016  Patient Details  Name: Stacey Page MRN: [id number] Date of Birth: 1950/05/19  Clinical Social Work is seeking post-discharge placement for this patient at the [place] level of care (*CSW will initial, date and re-position this form in  chart as items are completed):      Patient/family provided with [place] Work Department's list of facilities offering this level of care within the geographic area requested by the patient (or if unable, by the patient's family).      Patient/family informed of their freedom to choose among providers that offer the needed level of care, that participate in Medicare, Medicaid or managed care program needed by the patient, have an available bed and are willing to accept the patient.      Patient/family informed of [facility]'s ownership interest in [facility] and [facility], as well as of the fact that they are under no obligation to receive care at these facilities.  PASRR submitted to EDS on       PASRR number received on 03/15/16     Existing PASRR number confirmed on       FL2 transmitted to all facilities in geographic area requested by pt/family on 03/15/16     FL2 transmitted to all facilities within larger geographic area on       Patient informed that his/her managed care company has contracts with or will negotiate with certain facilities, including the following:            Patient/family informed of bed offers received.  Patient chooses bed at [facility].     Physician recommends and patient chooses bed at      Patient to be transferred to [facility] on 03/17/16.  Patient to be transferred to facility by [facility]     Patient family notified on 03/17/16  of transfer. Name of family member notified:        PHYSICIAN Please prepare priority discharge summary, including medications, Please prepare prescriptions,  Please sign FL2     Additional Comment:    _______________________________________________ Alla German, LCSW 03/15/2016, 2:24 PM

## 2016-03-15 NOTE — Progress Notes (Signed)
Changed patient's dressing on knee.  Bleeding some.  Placed Mepilex, ABD pads and new ACE wrap.  Patient handled well.

## 2016-03-15 NOTE — Evaluation (Signed)
Physical Therapy Evaluation Patient Details Name: Stacey Page MRN: [id number] DOB: 12-13-1950 Today's Date: 03/15/2016   History of Present Illness  66 y.o. female admitted to [facility] on 03/14/16 for elective R TKA.  Pt with significant PMHx of peripheral neuropathy, HTN, DM, COPD, CHF, and R lung biopsy.    Clinical Impression  Pt is POD #1 and is moving well.  Pt would benefit from SNF stay for rehab as I anticipate she will be slow to progress.  She could only get to the recliner chair this AM with mod assist and RW.   PT to follow acutely for deficits listed below.       Follow Up Recommendations SNF    Equipment Recommendations  None recommended by PT    Recommendations for Other Services   NA    Precautions / Restrictions Precautions Precautions: Knee Precaution Booklet Issued: Yes (comment) Precaution Comments: knee exercis handout given, no pillow under operative knee reviewed Required Braces or Orthoses: Knee Immobilizer - Right Knee Immobilizer - Right: On except when in CPM Restrictions RLE Weight Bearing: Weight bearing as tolerated      Mobility  Bed Mobility Overal bed mobility: Needs Assistance Bed Mobility: Supine to Sit;Sit to Supine     Supine to sit: Min assist;HOB elevated     General bed mobility comments: Min assist to help progress right leg to EOB and weight shift hips to get to sitting.   Transfers Overall transfer level: Needs assistance Equipment used: Rolling walker (2 wheeled) Transfers: Sit to/from Stand Sit to Stand: Mod assist;From elevated surface         General transfer comment: Mod assist to support trunk to get to standing.  Veral cues for safe hand placement and technique.   Ambulation/Gait             General Gait Details: Pt was only able to pivot to the chair today.              Balance Overall balance assessment: Needs assistance Sitting-balance support: Feet supported;Bilateral upper extremity  supported Sitting balance-Leahy Scale: Good     Standing balance support: Bilateral upper extremity supported Standing balance-Leahy Scale: Poor                               Pertinent Vitals/Pain Pain Assessment: 0-10 Pain Score: 6  Pain Location: right knee Pain Descriptors / Indicators: Aching;Burning Pain Intervention(s): Limited activity within patient's tolerance;Monitored during session;Repositioned    Home Living Family/patient expects to be discharged to:: Private residence Living Arrangements: Children Available Help at Discharge: Family;Available 24 hours/day;Other (Comment) (daughter works from home) Type of Home: House Home Access: Stairs to enter Entrance Stairs-Rails: None Technical brewer of Steps: 2 (spread apart) Home Layout: One level Home Equipment: Environmental consultant - 4 wheels;Transport chair;Cane - quad      Prior Function Level of Independence: Independent with assistive device(s)         Comments: used a cane and at times the rollator for gait        Extremity/Trunk Assessment   Upper Extremity Assessment Upper Extremity Assessment: Defer to OT evaluation    Lower Extremity Assessment Lower Extremity Assessment: RLE deficits/detail RLE Deficits / Details: right leg with normal post op pain and weakness, ankle at least 4/5, knee 2/5, hip flexion 2/5    Cervical / Trunk Assessment Cervical / Trunk Assessment: Normal  Communication   Communication: No difficulties  Cognition Arousal/Alertness: Awake/alert  Behavior During Therapy: WFL for tasks assessed/performed Overall Cognitive Status: Within Functional Limits for tasks assessed                         Exercises Total Joint Exercises Ankle Circles/Pumps: AROM;Both;20 reps Quad Sets: AROM;Right;10 reps Towel Squeeze: AROM;Both;10 reps Heel Slides: AAROM;Right;10 reps   Assessment/Plan    PT Assessment Patient needs continued PT services  PT Problem List  Decreased strength;Decreased range of motion;Decreased activity tolerance;Decreased balance;Decreased mobility;Decreased knowledge of use of DME;Obesity;Pain;Decreased knowledge of precautions          PT Treatment Interventions DME instruction;Gait training;Stair training;Functional mobility training;Therapeutic activities;Therapeutic exercise;Balance training;Patient/family education;Manual techniques;Modalities    PT Goals (Current goals can be found in the Care Plan section)  Acute Rehab PT Goals Patient Stated Goal: to go to rehab before going home so she can be more independent.  PT Goal Formulation: With patient Time For Goal Achievement: 03/22/16 Potential to Achieve Goals: Good    Frequency 7X/week           End of Session Equipment Utilized During Treatment: Right knee immobilizer Activity Tolerance: Patient limited by pain;Patient limited by fatigue Patient left: in chair;with call bell/phone within reach Nurse Communication: Mobility status         Time: 2694-8546 PT Time Calculation (min) (ACUTE ONLY): 20 min   Charges:   PT Evaluation $PT Eval Moderate Complexity: 1 Procedure          Calan Doren B. [place], [place], DPT [phone]   03/15/2016, 11:10 AM

## 2016-03-15 NOTE — Clinical Social Work Note (Signed)
Clinical Social Work Assessment  Patient Details  Name: Stacey Page MRN: [id number] Date of Birth: [birth year]  Date of referral:  03/15/16               Reason for consult:  Facility Placement                Permission sought to share information with:  Family Supports Permission granted to share information::     Name::        Agency::     Relationship::     Contact Information:     Housing/Transportation Living arrangements for the past 2 months:  [place] of Information:  Patient Patient Interpreter Needed:  None Criminal Activity/Legal Involvement Pertinent to Current Situation/Hospitalization:  No - Comment as needed Significant Relationships:  Adult Children Lives with:  Adult Children Do you feel safe going back to the place where you live?    Need for family participation in patient care:     Care giving concerns:  No family or friends present at bedside.    Social Worker assessment / plan:  CSW spoke with pt at bedside to complete initial assessment. Pt recently moved here from [place]. Pt lives with her daughter but her daughter has a baby and works. Pt states daughter will not be able to look after her, the baby, and work. Pt is agreeable to SNF placement at d/c. Pt is agreeable to [facility] fax out. CSW will follow up with pt once bed offers available.   Employment status:  Retired Nurse, adult PT Recommendations:  [place] / Referral to community resources:  [place]  Patient/Family's Response to care:  Pt verbalized understanding of CSW role and expressed appreciation for support. Pt denies any concern regarding pt care at this time.   Patient/Family's Understanding of and Emotional Response to Diagnosis, Current Treatment, and Prognosis:  Pt understanding and realistic regarding physical limitations. Pt understands the need for SNF placement at d/c. Pt agreeable  to SNF placement at d/c, at this time. Pt's responses emotionally appropriate during conversation with CSW. Pt denies any concern regarding treatment plan at this time. CSW will continue to provide support and facilitate d/c needs.   Emotional Assessment Appearance:  Appears stated age Attitude/Demeanor/Rapport:   (Patient was appropriate.) Affect (typically observed):  Accepting, Appropriate, Calm Orientation:  Oriented to Self, Oriented to Place, Oriented to  Time, Oriented to Situation Alcohol / Substance use:  Not Applicable Psych involvement (Current and /or in the community):  No (Comment)  Discharge Needs  Concerns to be addressed:  No discharge needs identified Readmission within the last 30 days:  No Current discharge risk:  Dependent with Mobility Barriers to Discharge:  Continued Medical Work up   [employer], LCSW 03/15/2016, 2:22 PM

## 2016-03-16 ENCOUNTER — Encounter (HOSPITAL_COMMUNITY): Payer: Self-pay | Admitting: General Practice

## 2016-03-16 LAB — GLUCOSE, CAPILLARY
GLUCOSE-CAPILLARY: 121 mg/dL — AB (ref 65–99)
Glucose-Capillary: 102 mg/dL — ABNORMAL HIGH (ref 65–99)
Glucose-Capillary: 106 mg/dL — ABNORMAL HIGH (ref 65–99)

## 2016-03-16 LAB — CBC
HEMATOCRIT: 25.9 % — AB (ref 36.0–46.0)
HEMOGLOBIN: 8.3 g/dL — AB (ref 12.0–15.0)
MCH: 28.2 pg (ref 26.0–34.0)
MCHC: 32 g/dL (ref 30.0–36.0)
MCV: 88.1 fL (ref 78.0–100.0)
PLATELETS: 187 10*3/uL (ref 150–400)
RBC: 2.94 MIL/uL — ABNORMAL LOW (ref 3.87–5.11)
RDW: 14.5 % (ref 11.5–15.5)
WBC: 7.7 10*3/uL (ref 4.0–10.5)

## 2016-03-16 LAB — BASIC METABOLIC PANEL
ANION GAP: 10 (ref 5–15)
BUN: 30 mg/dL — AB (ref 6–20)
CHLORIDE: 100 mmol/L — AB (ref 101–111)
CO2: 23 mmol/L (ref 22–32)
Calcium: 8.8 mg/dL — ABNORMAL LOW (ref 8.9–10.3)
Creatinine, Ser: 3.16 mg/dL — ABNORMAL HIGH (ref 0.44–1.00)
GFR calc Af Amer: 17 mL/min — ABNORMAL LOW (ref >60)
GFR, EST NON AFRICAN AMERICAN: 14 mL/min — AB (ref >60)
GLUCOSE: 109 mg/dL — AB (ref 65–99)
POTASSIUM: 4.2 mmol/L (ref 3.5–5.1)
Sodium: 133 mmol/L — ABNORMAL LOW (ref 135–145)

## 2016-03-16 LAB — PREPARE RBC (CROSSMATCH)

## 2016-03-16 MED ORDER — BISACODYL 10 MG RE SUPP
10.0000 mg | Freq: Once | RECTAL | Status: DC
  Filled 2016-03-16: qty 1

## 2016-03-16 MED ORDER — METHOCARBAMOL 500 MG PO TABS: 500.0000 mg | ORAL_TABLET | Freq: Four times a day (QID) | ORAL | 0 refills | Status: DC | PRN

## 2016-03-16 MED ORDER — MAGNESIUM CITRATE PO SOLN
0.5000 | Freq: Once | ORAL | Status: AC
  Administered 2016-03-16: 0.5 via ORAL
  Filled 2016-03-16: qty 296

## 2016-03-16 MED ORDER — OXYCODONE-ACETAMINOPHEN 5-325 MG PO TABS: 1.0000 | ORAL_TABLET | Freq: Four times a day (QID) | ORAL | 0 refills | Status: DC | PRN

## 2016-03-16 MED ORDER — SODIUM CHLORIDE 0.9 % IV SOLN
Freq: Once | INTRAVENOUS | Status: AC
  Administered 2016-03-16: 09:00:00 via INTRAVENOUS

## 2016-03-16 MED ORDER — ASPIRIN 325 MG PO TBEC: 325.0000 mg | DELAYED_RELEASE_TABLET | Freq: Every day | ORAL | 0 refills | Status: DC

## 2016-03-16 NOTE — Progress Notes (Signed)
Orthopedic Tech Progress Note Patient Details:  Stacey Page May 28, 1950 [id number]  Patient ID: Stacey Page, female   DOB: 07-Nov-1950, 66 y.o.   MRN: [id number]   Stacey Page 03/16/2016, 1:19 PM Placed pt's rle on cpm @0 -60 degrees 21320; rn NOTIFIED

## 2016-03-16 NOTE — Clinical Social Work Note (Addendum)
Pt will receive blood today. Pt has accepted a bed at [facility] at this time. CSW has started insurance authorization with [employer] at this time stating anticipated d/c for 03/17/16.  2:20: CSW has received insurance auth at this time for d/c 2/8, authorization number: [id number]. CSW will follow up with [employer] [phone]) on 2/8 to confirm if pt does or does not d/c on 2/8.  [address], [place]

## 2016-03-16 NOTE — Progress Notes (Addendum)
Subjective: Good pain control right knee.  C/o some nausea.  no abd pain.  Positive flatus.  No BM x 3-4 days.  Eating ok. feels somewhat tired and fatigued when up out of bed.     Objective: Vital signs in last 24 hours: Temp:  [99.1 F (37.3 C)-99.5 F (37.5 C)] 99.1 F (37.3 C) (02/07 0415) Pulse Rate:  [66-74] 66 (02/07 0415) Resp:  [15-16] 15 (02/07 0415) BP: (100-152)/(43-60) 106/48 (02/07 0415) SpO2:  [98 %-99 %] 99 % (02/07 0415)  Intake/Output from previous day: 02/06 0701 - 02/07 0700 In: 240 [P.O.:240] Out: 300 [Urine:300] Intake/Output this shift: No intake/output data recorded.   Recent Labs  03/15/16 0513 03/16/16 0626  HGB 9.6* 8.3*    Recent Labs  03/15/16 0513 03/16/16 0626  WBC 6.3 7.7  RBC 3.37* 2.94*  HCT 29.6* 25.9*  PLT 198 187    Recent Labs  03/15/16 0513 03/16/16 0626  NA 134* 133*  K 4.7 4.2  CL 103 100*  CO2 23 23  BUN 19 30*  CREATININE 1.34* 3.16*  GLUCOSE 124* 109*  CALCIUM 8.9 8.8*   No results for input(s): LABPT, INR in the last 72 hours.  Exam:  Pleasant female alert and oriented, NAD.  abd nontender.  Right knee wound looks good.  steris intact.  No drainage or signs of infection.  bilat calves nontender.    Assessment/Plan: Symptomatic anemia so will transfuse one unit PRBC's today. Was given bolus of NS this AM for hypotension.  Mag citrate and dulcolax supp to help move bowels.  Awaiting SNF placement.  Encourage incentive spirometry.    Benjiman Core 03/16/2016, 8:27 AM

## 2016-03-16 NOTE — Progress Notes (Signed)
PT Cancellation Note  Patient Details Name: Stacey Page MRN: [id number] DOB: April 28, 1950   Cancelled Treatment:    Reason Eval/Treat Not Completed: Medical issues which prohibited therapy (Pt not willing to get up until she has received blood)   Melvern Banker 03/16/2016, 2:04 PM Lavonia Dana, [place] 03/16/2016

## 2016-03-16 NOTE — Discharge Summary (Signed)
Patient ID: Stacey Page MRN: [id number] DOB/AGE: 13-Dec-1950 66 y.o.  Admit date: 03/14/2016 Discharge date: 03/16/2016  Admission Diagnoses:  Active Problems:   Primary osteoarthritis of right knee   Arthritis of right knee   Discharge Diagnoses:  Active Problems:   Primary osteoarthritis of right knee   Arthritis of right knee  status post Procedure(s): RIGHT TOTAL KNEE ARTHROPLASTY  Past Medical History:  Diagnosis Date  . Anxiety    doesn't take any meds  . Asthma   . CHF (congestive heart failure) (HCC)    takes Furosemide daily  . COPD (chronic obstructive pulmonary disease) (HCC)    Albuterol as needed  . Depression    doesn't take meds  . Diabetes ([place])    takes Januvia daily  . Eczema    uses cream as needed  . GERD (gastroesophageal reflux disease)   . History of bronchitis as a child   . HTN (hypertension)    takes Lisinopril and Labetalol daily  . Insomnia   . Joint pain   . Joint swelling   . OA (osteoarthritis)   . OSA (obstructive sleep apnea) 02/25/2014   wears CPAP at night  . Peripheral neuropathy (HCC)    takes Gabapentin as needed  . Pneumonia    hx of-2010  . Seasonal allergies    uses Flonase daily    Surgeries: Procedure(s): RIGHT TOTAL KNEE ARTHROPLASTY on 03/14/2016   Consultants:   Discharged Condition: Improved  Hospital Course: Stacey Page is an 66 y.o. female who was admitted 03/14/2016 for operative treatment of right knee arthritis. Patient failed conservative treatments (please see the history and physical for the specifics) and had severe unremitting pain that affects sleep, daily activities and work/hobbies. After pre-op clearance, the patient was taken to the operating room on 03/14/2016 and underwent  Procedure(s): RIGHT TOTAL KNEE ARTHROPLASTY.    Patient was given perioperative antibiotics: Anti-infectives    Start     Dose/Rate Route Frequency Ordered Stop   03/14/16 1215  ceFAZolin (ANCEF) IVPB 2g/100 mL  premix     2 g 200 mL/hr over 30 Minutes Intravenous On call to O.R. 03/14/16 1102 03/14/16 1250   03/14/16 1105  ceFAZolin (ANCEF) 2-4 GM/100ML-% IVPB    Comments:  Henrine Screws   : cabinet override      03/14/16 1105 03/14/16 1250       Patient was given sequential compression devices and early ambulation to prevent DVT.   Patient benefited maximally from hospital stay and there were no complications. At the time of discharge, the patient was urinating/moving their bowels without difficulty, tolerating a regular diet, pain is controlled with oral pain medications and they have been cleared by PT/OT.   Recent vital signs: Patient Vitals for the past 24 hrs:  BP Temp Temp src Pulse Resp SpO2  03/16/16 0415 (!) 106/48 99.1 F (37.3 C) Oral 66 15 99 %  03/15/16 2310 (!) 109/51 99.5 F (37.5 C) Oral 67 16 98 %  03/15/16 1746 (!) 102/50 - - 68 - -  03/15/16 1459 (!) 125/43 - - 68 - -  03/15/16 1354 (!) 100/50 - - - - -  03/15/16 8119 - - - 74 - -  03/15/16 0904 (!) 152/60 - - - - -     Recent laboratory studies:  Recent Labs  03/15/16 0513 03/16/16 0626  WBC 6.3 7.7  HGB 9.6* 8.3*  HCT 29.6* 25.9*  PLT 198 187  NA 134* 133*  K 4.7 4.2  CL 103 100*  CO2 23 23  BUN 19 30*  CREATININE 1.34* 3.16*  GLUCOSE 124* 109*  CALCIUM 8.9 8.8*     Discharge Medications:   Allergies as of 03/16/2016      Reactions   No Known Allergies       Medication List    STOP taking these medications   HYDROcodone-acetaminophen 7.5-325 MG tablet Commonly known as:  NORCO   naproxen sodium 220 MG tablet Commonly known as:  ANAPROX   Suvorexant 10 MG Tabs Commonly known as:  BELSOMRA     TAKE these medications   albuterol 108 (90 Base) MCG/ACT inhaler Commonly known as:  PROAIR HFA Inhale 2 puffs into the lungs every 6 (six) hours as needed for wheezing or shortness of breath.   ALPRAZolam 0.25 MG tablet Commonly known as:  XANAX Take 1 tablet (0.25 mg total) by mouth 2 (two)  times daily as needed for anxiety.   aspirin 325 MG EC tablet Take 1 tablet (325 mg total) by mouth daily with breakfast.   atorvastatin 20 MG tablet Commonly known as:  LIPITOR Take 1 tablet (20 mg total) by mouth daily at 6 PM.   Clobetasol Prop Emollient Base 0.05 % emollient cream Commonly known as:  CLOBETASOL PROPIONATE E Apply 1 application topically daily as needed (skin irritations).   fluticasone 50 MCG/ACT nasal spray Commonly known as:  FLONASE PLACE 1 SPRAY INTO BOTH NOSTRILS DAILY.   furosemide 40 MG tablet Commonly known as:  LASIX TAKE 1 TABLET (40 MG TOTAL) BY MOUTH DAILY. What changed:  how much to take  how to take this  when to take this  additional instructions  Another medication with the same name was removed. Continue taking this medication, and follow the directions you see here.   gabapentin 300 MG capsule Commonly known as:  NEURONTIN Take 1 capsule (300 mg total) by mouth 3 (three) times daily. What changed:  when to take this  reasons to take this   labetalol 200 MG tablet Commonly known as:  NORMODYNE Take 1 tablet (200 mg total) by mouth 2 (two) times daily.   lisinopril 20 MG tablet Commonly known as:  PRINIVIL,ZESTRIL Take 1 tablet (20 mg total) by mouth daily. What changed:  Another medication with the same name was removed. Continue taking this medication, and follow the directions you see here.   methocarbamol 500 MG tablet Commonly known as:  ROBAXIN Take 1 tablet (500 mg total) by mouth every 6 (six) hours as needed for muscle spasms.   montelukast 10 MG tablet Commonly known as:  SINGULAIR TAKE 1 TABLET (10 MG TOTAL) BY MOUTH AT BEDTIME. What changed:  how much to take  how to take this  when to take this  additional instructions   oxyCODONE-acetaminophen 5-325 MG tablet Commonly known as:  ROXICET Take 1 tablet by mouth every 6 (six) hours as needed for severe pain.   potassium citrate 10 MEQ (1080 MG) SR  tablet Commonly known as:  UROCIT-K Take 1 tablet (10 mEq total) by mouth daily.   sitaGLIPtin 100 MG tablet Commonly known as:  JANUVIA Take 1 tablet (100 mg total) by mouth daily.       Diagnostic Studies: Dg Knee 1-2 Views Right  Result Date: 03/14/2016 CLINICAL DATA:  Right knee arthroplasty. EXAM: RIGHT KNEE - 1-2 VIEW COMPARISON:  Right knee radiographs 05/27/2015. FINDINGS: A right total knee arthroplasty is noted. Right-sided joint effusion is present, as  expected. Joint spacing is symmetric. IMPRESSION: Right total knee arthroplasty without radiographic evidence for complication. Electronically Signed   By: San Morelle M.D.   On: 03/14/2016 15:55   Xr Knee 1-2 Views Left  Result Date: 02/23/2016 AP lateral left knee shows end-stage osteoarthritis bone-on-bone medial compartment marginal osteophyte subchondral sclerosis. Impression: Tricompartmental osteoarthritis left knee.  Xr Knee 1-2 Views Right  Result Date: 02/23/2016 Standing AP and lateral right knee obtained. This shows end-stage osteoarthritis tricompartmental with large osteophyte subchondral sclerosis flattening of the femoral condyle subchondral cyst formation. Impression  : severe primary osteoarthritis right knee. No evidence of acute fracture.     Follow-up Information    Marybelle Killings, MD. Schedule an appointment as soon as possible for a visit today.   Specialty:  Orthopedic Surgery Why:  need return office visit 2 weeks postop Contact information: [place] [address] [phone]           Discharge Plan:  discharge to SNF  Disposition:     Signed: Benjiman Core for Dr Rodell Perna [facility] [phone] 03/16/2016, 8:09 AM

## 2016-03-16 NOTE — Progress Notes (Signed)
Physical Therapy Treatment Patient Details Name: Stacey Page MRN: [id number] DOB: 1951/01/22 Today's Date: 2016-03-17    History of Present Illness 66 y.o. female admitted to [facility] on 03/14/16 for elective R TKA.  Pt with significant PMHx of peripheral neuropathy, HTN, DM, COPD, CHF, and R lung biopsy.      PT Comments    Only performed bed exercises this session due to pt did not want to attempt OOB until receiving blood today. PT will check back in pm to attempt mobility.  Excellent extension in r knee but limited flexion.  Continue to recommend SNF for ongoing rehab.   Follow Up Recommendations  SNF     Equipment Recommendations  None recommended by PT    Recommendations for Other Services       Precautions / Restrictions Precautions Precautions: Knee Required Braces or Orthoses: Knee Immobilizer - Right Knee Immobilizer - Right: On except when in CPM Restrictions Weight Bearing Restrictions: Yes RLE Weight Bearing: Weight bearing as tolerated    Mobility  Bed Mobility                  Transfers                    Ambulation/Gait                 Stairs            Wheelchair Mobility    Modified Rankin (Stroke Patients Only)       Balance                                    Cognition Arousal/Alertness: Awake/alert Behavior During Therapy: WFL for tasks assessed/performed Overall Cognitive Status: Within Functional Limits for tasks assessed                      Exercises Total Joint Exercises Ankle Circles/Pumps: AROM;Both;20 reps Quad Sets: AROM;Right;10 reps;Supine Short Arc Quad: AAROM;Right;10 reps;Supine Heel Slides: AAROM;Right;10 reps;Supine (Limited Knee Flexion, only able to achieve about 30 degrees ) Hip ABduction/ADduction: AAROM;Right;10 reps;Supine Straight Leg Raises: AAROM;Right;10 reps;Supine    General Comments        Pertinent Vitals/Pain Pain Assessment: 0-10 Pain Score: 8   Pain Location: right knee Pain Intervention(s): Limited activity within patient's tolerance;Monitored during session    Home Living                      Prior Function            PT Goals (current goals can now be found in the care plan section) Progress towards PT goals: Progressing toward goals    Frequency           PT Plan Current plan remains appropriate    Co-evaluation             End of Session   Activity Tolerance: Patient limited by pain;Other (comment) (Limited secondary to pt wanted to receive blood first) Patient left: in bed;with call bell/phone within reach;with nursing/sitter in room     Time: 1041-1058 PT Time Calculation (min) (ACUTE ONLY): 17 min  Charges:  $Therapeutic Exercise: 8-22 mins                    G Codes:      Melvern Banker 03-17-16, 11:04 AM  Lavonia Dana, PT  [phone] 2016-03-17

## 2016-03-16 NOTE — Progress Notes (Signed)
Nurse went to administer blood. Patient had slight temperature of 99.7. MD notified, stated to hold blood until temperature comes down, and encourage the patient to use the incentive spirometer. Nurse also administered tylenol. Nurse will continue to monitor

## 2016-03-16 NOTE — Discharge Instructions (Addendum)
INSTRUCTIONS AFTER JOINT REPLACEMENT  ° °o Remove items at home which could result in a fall. This includes throw rugs or furniture in walking pathways °o ICE to the affected joint every three hours while awake for 30 minutes at a time, for at least the first 3-5 days, and then as needed for pain and swelling.  Continue to use ice for pain and swelling. You may notice swelling that will progress down to the foot and ankle.  This is normal after surgery.  Elevate your leg when you are not up walking on it.   °o Continue to use the breathing machine you got in the hospital (incentive spirometer) which will help keep your temperature down.  It is common for your temperature to cycle up and down following surgery, especially at night when you are not up moving around and exerting yourself.  The breathing machine keeps your lungs expanded and your temperature down. ° ° °DIET:  As you were doing prior to hospitalization, we recommend a well-balanced diet. ° °DRESSING / WOUND CARE / SHOWERING ° °You may change your dressing 3-5 days after surgery.  Then change the dressing every day with sterile gauze.  Please use good hand washing techniques before changing the dressing.  Do not use any lotions or creams on the incision until instructed by your surgeon. ° °ACTIVITY ° °o Increase activity slowly as tolerated, but follow the weight bearing instructions below.   °o No driving for 6 weeks or until further direction given by your physician.  You cannot drive while taking narcotics.  °o No lifting or carrying greater than 10 lbs. until further directed by your surgeon. °o Avoid periods of inactivity such as sitting longer than an hour when not asleep. This helps prevent blood clots.  °o You may return to work once you are authorized by your doctor.  ° ° ° °WEIGHT BEARING  ° °Weight bearing as tolerated with assist device (walker, cane, etc) as directed, use it as long as suggested by your surgeon or therapist, typically at  least 4-6 weeks. ° ° °EXERCISES ° °Results after joint replacement surgery are often greatly improved when you follow the exercise, range of motion and muscle strengthening exercises prescribed by your doctor. Safety measures are also important to protect the joint from further injury. Any time any of these exercises cause you to have increased pain or swelling, decrease what you are doing until you are comfortable again and then slowly increase them. If you have problems or questions, call your caregiver or physical therapist for advice.  ° °Rehabilitation is important following a joint replacement. After just a few days of immobilization, the muscles of the leg can become weakened and shrink (atrophy).  These exercises are designed to build up the tone and strength of the thigh and leg muscles and to improve motion. Often times heat used for twenty to thirty minutes before working out will loosen up your tissues and help with improving the range of motion but do not use heat for the first two weeks following surgery (sometimes heat can increase post-operative swelling).  ° °These exercises can be done on a training (exercise) mat, on the floor, on a table or on a bed. Use whatever works the best and is most comfortable for you.    Use music or television while you are exercising so that the exercises are a pleasant break in your day. This will make your life better with the exercises acting as a break   in your routine that you can look forward to.   Perform all exercises about fifteen times, three times per day or as directed.  You should exercise both the operative leg and the other leg as well. ° °Exercises include: °  °• Quad Sets - Tighten up the muscle on the front of the thigh (Quad) and hold for 5-10 seconds.   °• Straight Leg Raises - With your knee straight (if you were given a brace, keep it on), lift the leg to 60 degrees, hold for 3 seconds, and slowly lower the leg.  Perform this exercise against  resistance later as your leg gets stronger.  °• Leg Slides: Lying on your back, slowly slide your foot toward your buttocks, bending your knee up off the floor (only go as far as is comfortable). Then slowly slide your foot back down until your leg is flat on the floor again.  °• Angel Wings: Lying on your back spread your legs to the side as far apart as you can without causing discomfort.  °• Hamstring Strength:  Lying on your back, push your heel against the floor with your leg straight by tightening up the muscles of your buttocks.  Repeat, but this time bend your knee to a comfortable angle, and push your heel against the floor.  You may put a pillow under the heel to make it more comfortable if necessary.  ° °A rehabilitation program following joint replacement surgery can speed recovery and prevent re-injury in the future due to weakened muscles. Contact your doctor or a physical therapist for more information on knee rehabilitation.  ° ° °CONSTIPATION ° °Constipation is defined medically as fewer than three stools per week and severe constipation as less than one stool per week.  Even if you have a regular bowel pattern at home, your normal regimen is likely to be disrupted due to multiple reasons following surgery.  Combination of anesthesia, postoperative narcotics, change in appetite and fluid intake all can affect your bowels.  ° °YOU MUST use at least one of the following options; they are listed in order of increasing strength to get the job done.  They are all available over the counter, and you may need to use some, POSSIBLY even all of these options:   ° °Drink plenty of fluids (prune juice may be helpful) and high fiber foods °Colace 100 mg by mouth twice a day  °Senokot for constipation as directed and as needed Dulcolax (bisacodyl), take with full glass of water  °Miralax (polyethylene glycol) once or twice a day as needed. ° °If you have tried all these things and are unable to have a bowel  movement in the first 3-4 days after surgery call either your surgeon or your primary doctor.   ° °If you experience loose stools or diarrhea, hold the medications until you stool forms back up.  If your symptoms do not get better within 1 week or if they get worse, check with your doctor.  If you experience "the worst abdominal pain ever" or develop nausea or vomiting, please contact the office immediately for further recommendations for treatment. ° ° °ITCHING:  If you experience itching with your medications, try taking only a single pain pill, or even half a pain pill at a time.  You can also use Benadryl over the counter for itching or also to help with sleep.  ° °TED HOSE STOCKINGS:  Use stockings on both legs until for at least 2 weeks or as   directed by physician office. They may be removed at night for sleeping.  MEDICATIONS:  See your medication summary on the After Visit Summary that nursing will review with you.  You may have some home medications which will be placed on hold until you complete the course of blood thinner medication.  It is important for you to complete the blood thinner medication as prescribed.  PRECAUTIONS:  If you experience chest pain or shortness of breath - call 911 immediately for transfer to the hospital emergency department.   If you develop a fever greater that 101 F, purulent drainage from wound, increased redness or drainage from wound, foul odor from the wound/dressing, or calf pain - CONTACT YOUR SURGEON.                                                   FOLLOW-UP APPOINTMENTS:  If you do not already have a post-op appointment, please call the office for an appointment to be seen by your surgeon.  Guidelines for how soon to be seen are listed in your After Visit Summary, but are typically between 1-4 weeks after surgery.  OTHER INSTRUCTIONS:   Knee Replacement:  Do not place pillow under knee, focus on keeping the knee straight while resting. CPM  instructions: 0-90 degrees, 2 hours in the morning, 2 hours in the afternoon, and 2 hours in the evening. Place foam block, curve side up under heel at all times except when in CPM or when walking.  DO NOT modify, tear, cut, or change the foam block in any way. Avoid NSAIDS NEEDS REPEAT LABS IN ONE WEEK TO CHECK BUN AND CREATINE.  MAKE SURE YOU:   Understand these instructions.   Get help right away if you are not doing well or get worse.    Thank you for letting us be a part of your medical care team.  It is a privilege we respect greatly.  We hope these instructions will help you stay on track for a fast and full recovery!

## 2016-03-17 ENCOUNTER — Inpatient Hospital Stay (HOSPITAL_COMMUNITY): Payer: PPO

## 2016-03-17 DIAGNOSIS — D649 Anemia, unspecified: Secondary | ICD-10-CM | POA: Diagnosis present

## 2016-03-17 DIAGNOSIS — N183 Chronic kidney disease, stage 3 unspecified: Secondary | ICD-10-CM | POA: Diagnosis present

## 2016-03-17 DIAGNOSIS — E785 Hyperlipidemia, unspecified: Secondary | ICD-10-CM

## 2016-03-17 DIAGNOSIS — D62 Acute posthemorrhagic anemia: Secondary | ICD-10-CM

## 2016-03-17 DIAGNOSIS — N179 Acute kidney failure, unspecified: Secondary | ICD-10-CM | POA: Diagnosis present

## 2016-03-17 DIAGNOSIS — Z96651 Presence of right artificial knee joint: Secondary | ICD-10-CM

## 2016-03-17 DIAGNOSIS — G4733 Obstructive sleep apnea (adult) (pediatric): Secondary | ICD-10-CM

## 2016-03-17 LAB — BASIC METABOLIC PANEL
Anion gap: 11 (ref 5–15)
BUN: 39 mg/dL — ABNORMAL HIGH (ref 6–20)
CALCIUM: 8.9 mg/dL (ref 8.9–10.3)
CO2: 24 mmol/L (ref 22–32)
CREATININE: 3.28 mg/dL — AB (ref 0.44–1.00)
Chloride: 97 mmol/L — ABNORMAL LOW (ref 101–111)
GFR calc Af Amer: 16 mL/min — ABNORMAL LOW (ref >60)
GFR, EST NON AFRICAN AMERICAN: 14 mL/min — AB (ref >60)
GLUCOSE: 116 mg/dL — AB (ref 65–99)
Potassium: 4.2 mmol/L (ref 3.5–5.1)
Sodium: 132 mmol/L — ABNORMAL LOW (ref 135–145)

## 2016-03-17 LAB — URINALYSIS, ROUTINE W REFLEX MICROSCOPIC
BILIRUBIN URINE: NEGATIVE
Glucose, UA: NEGATIVE mg/dL
KETONES UR: NEGATIVE mg/dL
LEUKOCYTES UA: NEGATIVE
Nitrite: NEGATIVE
PH: 5 (ref 5.0–8.0)
Protein, ur: NEGATIVE mg/dL
Specific Gravity, Urine: 1.014 (ref 1.005–1.030)

## 2016-03-17 LAB — CBC
HCT: 26.9 % — ABNORMAL LOW (ref 36.0–46.0)
Hemoglobin: 8.8 g/dL — ABNORMAL LOW (ref 12.0–15.0)
MCH: 28.6 pg (ref 26.0–34.0)
MCHC: 32.7 g/dL (ref 30.0–36.0)
MCV: 87.3 fL (ref 78.0–100.0)
PLATELETS: 159 10*3/uL (ref 150–400)
RBC: 3.08 MIL/uL — ABNORMAL LOW (ref 3.87–5.11)
RDW: 14.9 % (ref 11.5–15.5)
WBC: 8.8 10*3/uL (ref 4.0–10.5)

## 2016-03-17 LAB — PREPARE RBC (CROSSMATCH)

## 2016-03-17 LAB — GLUCOSE, CAPILLARY
GLUCOSE-CAPILLARY: 104 mg/dL — AB (ref 65–99)
Glucose-Capillary: 105 mg/dL — ABNORMAL HIGH (ref 65–99)
Glucose-Capillary: 91 mg/dL (ref 65–99)
Glucose-Capillary: 123 mg/dL — ABNORMAL HIGH (ref 65–99)

## 2016-03-17 LAB — HEMOGLOBIN AND HEMATOCRIT, BLOOD
HCT: 30.4 % — ABNORMAL LOW (ref 36.0–46.0)
Hemoglobin: 10 g/dL — ABNORMAL LOW (ref 12.0–15.0)

## 2016-03-17 MED ORDER — SODIUM CHLORIDE 0.9 % IV SOLN
Freq: Once | INTRAVENOUS | Status: AC
  Administered 2016-03-17: 10:00:00 via INTRAVENOUS

## 2016-03-17 MED ORDER — MAGNESIUM CITRATE PO SOLN
0.5000 | Freq: Once | ORAL | Status: AC
  Administered 2016-03-17: 0.5 via ORAL
  Filled 2016-03-17: qty 296

## 2016-03-17 MED ORDER — SODIUM CHLORIDE 0.9 % IV SOLN
INTRAVENOUS | Status: AC
  Administered 2016-03-17: 10:00:00 via INTRAVENOUS

## 2016-03-17 NOTE — Progress Notes (Addendum)
Subjective: Pain controlled.  Feels much better after transfusion prbc's.  No BM yesterday.  Good appetite.  No abd pain.  Positive flatus.    Objective: Vital signs in last 24 hours: Temp:  [97.5 F (36.4 C)-99.7 F (37.6 C)] 98.3 F (36.8 C) (02/08 0745) Pulse Rate:  [53-72] 57 (02/08 0745) Resp:  [16-20] 18 (02/08 0745) BP: (97-132)/(30-52) 113/43 (02/08 0408) SpO2:  [96 %-99 %] 96 % (02/08 0745)  Intake/Output from previous day: 02/07 0701 - 02/08 0700 In: 559.5 [Blood:559.5] Out: -  Intake/Output this shift: No intake/output data recorded.   Recent Labs  03/15/16 0513 03/16/16 0626 03/17/16 0400  HGB 9.6* 8.3* 8.8*    Recent Labs  03/16/16 0626 03/17/16 0400  WBC 7.7 8.8  RBC 2.94* 3.08*  HCT 25.9* 26.9*  PLT 187 159    Recent Labs  03/16/16 0626 03/17/16 0400  NA 133* 132*  K 4.2 4.2  CL 100* 97*  CO2 23 24  BUN 30* 39*  CREATININE 3.16* 3.28*  GLUCOSE 109* 116*  CALCIUM 8.8* 8.9   No results for input(s): LABPT, INR in the last 72 hours.  Exam:  Alert and oriented.  NAD.  Right knee wound looks good.  steris intact.  No drainage or signs of infection.  Calf nontender.  NVI.    Assessment/Plan: Elevated creatinine.  Will get medicine consult.   Mag citrate 1/2 bottle this morning.  Scripts on chart for percocet, robaxin and aspirin.    Benjiman Core 03/17/2016, 8:47 AM

## 2016-03-17 NOTE — Progress Notes (Signed)
Orthopedic Tech Progress Note Patient Details:  Stacey Page [birth year] [id number]  Patient ID: [name], female   DOB: [birth year], 66 y.o.   MRN: [id number] Applied cpm 0-60  Karolee Stamps 03/17/2016, 6:52 AM

## 2016-03-17 NOTE — Progress Notes (Signed)
PT Cancellation Note  Patient Details Name: Stacey Page MRN: [id number] DOB: Jun 12, 1950   Cancelled Treatment:    Reason Eval/Treat Not Completed: Patient declined, no reason specified. Pt had just had a bowel movement and is waiting on nurse tech to help her get cleaned up. Requests PT hold off until tomorrow.    Scheryl Marten PT, DPT  [phone]  03/17/2016, 4:13 PM

## 2016-03-17 NOTE — Progress Notes (Signed)
Physical Therapy Treatment Patient Details Name: Stacey Page MRN: [id number] DOB: 05/11/1950 Today's Date: 03/17/2016    History of Present Illness 66 y.o. female admitted to [facility] on 03/14/16 for elective R TKA.  Pt with significant PMHx of peripheral neuropathy, HTN, DM, COPD, CHF, and R lung biopsy.      PT Comments    Pt was able to progress gait into the hallway today, however, she fatigues very quickly and almost didn't make it back to her room to the recliner chair, talking multiple standing rest breaks resting her arms on the RW.  PT will continue to progress mobility and exercises and she remains appropriate for SNF at discharge.   Follow Up Recommendations  SNF     Equipment Recommendations  None recommended by PT    Recommendations for Other Services   NA     Precautions / Restrictions Precautions Precautions: Knee Precaution Booklet Issued: Yes (comment) Precaution Comments: knee exercis handout given, no pillow under operative knee reviewed Required Braces or Orthoses: Knee Immobilizer - Right Knee Immobilizer - Right: On except when in CPM Restrictions RLE Weight Bearing: Weight bearing as tolerated    Mobility  Bed Mobility Overal bed mobility: Needs Assistance Bed Mobility: Supine to Sit     Supine to sit: Min assist;HOB elevated     General bed mobility comments: Min assist to help progress right leg over to EOB.  Pt using bed rail for leverage at trunk and did not need help with hips this time.   Transfers Overall transfer level: Needs assistance Equipment used: Rolling walker (2 wheeled) Transfers: Sit to/from Stand Sit to Stand: Mod assist         General transfer comment: Mod assist to support trunk during transitions and help stabilize RW as she feels she gets her best leverage by pulling on the walker.   Ambulation/Gait Ambulation/Gait assistance: Min assist Ambulation Distance (Feet): 25 Feet Assistive device: Rolling walker (2  wheeled) Gait Pattern/deviations: Step-to pattern;Antalgic Gait velocity: decreased Gait velocity interpretation: Below normal speed for age/gender General Gait Details: Pt with moderately antalgic gait pattern, verbal cues for LE sequencing and safe proximity to RW itself (she was too close to the front).  Pt fatigued quickly and almost could not make it back to the recliner chair.  Would be safer to encourage increased gait distance for chair to follow next session.         Balance Overall balance assessment: Needs assistance Sitting-balance support: Feet supported;Bilateral upper extremity supported Sitting balance-Leahy Scale: Good     Standing balance support: Bilateral upper extremity supported Standing balance-Leahy Scale: Poor                      Cognition Arousal/Alertness: Awake/alert Behavior During Therapy: WFL for tasks assessed/performed Overall Cognitive Status: Within Functional Limits for tasks assessed                      Exercises Total Joint Exercises Ankle Circles/Pumps: AROM;Both;20 reps Quad Sets: AROM;Right;10 reps Towel Squeeze: AROM;Both;10 reps Heel Slides: AAROM;Right;10 reps Goniometric ROM: 10-50        Pertinent Vitals/Pain Pain Assessment: 0-10 Pain Score: 6  Pain Location: right knee Pain Descriptors / Indicators: Aching;Burning Pain Intervention(s): Limited activity within patient's tolerance;Monitored during session;Repositioned           PT Goals (current goals can now be found in the care plan section) Acute Rehab PT Goals Patient Stated Goal: to go to rehab  before going home so she can be more independent.  Progress towards PT goals: Progressing toward goals    Frequency    7X/week      PT Plan Current plan remains appropriate       End of Session Equipment Utilized During Treatment: Right knee immobilizer;Gait belt Activity Tolerance: Patient limited by fatigue;Patient limited by pain Patient  left: with call bell/phone within reach;in chair     Time: 6270-3500 PT Time Calculation (min) (ACUTE ONLY): 20 min  Charges:  $Gait Training: 8-22 mins                      Stacey Page B. Mikeria Valin, Elmer, DPT [phone]   03/17/2016, 1:57 PM

## 2016-03-17 NOTE — Progress Notes (Addendum)
Clarification: Reaction to ASA was rash and not GI upset-d/w pharmacist and will dc ASA  Erin Hearing, ANP

## 2016-03-17 NOTE — Care Management Important Message (Signed)
Important Message  Patient Details  Name: Stacey Page MRN: [id number] Date of Birth: 03-Nov-1950   Medicare Important Message Given:  Yes    Wylene Weissman Montine Circle 03/17/2016, 11:53 AM

## 2016-03-17 NOTE — Consult Note (Signed)
Consultation Note   Stacey Page QJJ:[id number] DOB: [birth year] DOA: 03/14/2016   PCP: Wilfred Lacy, NP   Patient coming from/Resides with: Private residence  Requesting physician: Dr. Yates/orthopedicS  Reason for consultation: Acute kidney injury  HPI: Stacey Page is a 66 y.o. female with medical history significant for CK D stage III, OSA on nocturnal CPAP, hypertension, steroid-induced controlled diabetes, chronic diastolic heart failure, history of ILD secondary to MAI (resolved) and end-stage osteoarthritis of the right knee. Patient was admitted on 2/5 and underwent a right total knee arthroplasty. She had an EBL documented of 150 mL. She had one blood pressure reading of 84 /36 postoperatively but subsequent blood pressures were in the hypertensive range until the evening of 2/6 systolic blood pressure dropped to 102 and readings remained in the low 100 range until the afternoon of 2/7 systolic blood pressure once again dipped to 97. Since that time blood pressures have remained in the low 110s. Of note patient's hemoglobin has dropped from a preoperative reading of 12.8-8.3 and patient has subsequently received 1 unit of packed red blood cells. She has complained of generalized malaise and fatigue with performing physical therapy postoperatively. She is 1800 mL positive on her I/O. Her creatinine has increased from a baseline of 1.34-3.28 with a BUN of 39. She reports difficulty voiding and is only passing small amounts despite being given Lasix during her hospitalization. She has had some abdominal bloating and nausea over the past 24 hours. She is passing flatus but has not had a bowel movement. She denies abdominal pain,dark or bloody stools.   Review of Systems:  In addition to the HPI above,  No Fever-chills, myalgias or other constitutional symptoms No Headache, changes with Vision or hearing, new weakness, tingling, numbness in any extremity, dizziness, dysarthria or  word finding difficulty, gait disturbance or imbalance, tremors or seizure activity No problems swallowing food or Liquids, indigestion/reflux, choking or coughing while eating, abdominal pain with or after eating No Chest pain, Cough or Shortness of Breath, palpitations, orthopnea  No Abdominal pain, N/V, melena,hematochezia, dark tarry stools, constipation No malodorous urine, hematuria or flank pain No new skin rashes, lesions, masses or bruises, No new joint pains, aches, swelling or redness No recent unintentional weight gain or loss No polyuria, polydypsia or polyphagia   Past Medical History:  Diagnosis Date  . Anxiety    doesn't take any meds  . Asthma   . CHF (congestive heart failure) (HCC)    takes Furosemide daily  . COPD (chronic obstructive pulmonary disease) (HCC)    Albuterol as needed  . Depression    doesn't take meds  . Diabetes ([place])    takes Januvia daily  . Eczema    uses cream as needed  . GERD (gastroesophageal reflux disease)   . History of bronchitis as a child   . HTN (hypertension)    takes Lisinopril and Labetalol daily  . Insomnia   . Joint pain   . Joint swelling   . OA (osteoarthritis)   . OSA (obstructive sleep apnea) 02/25/2014   wears CPAP at night  . Peripheral neuropathy (HCC)    takes Gabapentin as needed  . Pneumonia    hx of-2010  . Seasonal allergies    uses Flonase daily    Past Surgical History:  Procedure Laterality Date  . CESAREAN SECTION  x2  . COLONOSCOPY    . JOINT REPLACEMENT    . KNEE ARTHROSCOPY Right   . LUNG BIOPSY Right 06/10/2014  Procedure: LUNG BIOPSY;  Surgeon: Ivin Poot, MD;  Location: [place];  Service: Thoracic;  Laterality: Right;  . POLYPECTOMY     throat  . TOTAL KNEE ARTHROPLASTY Right 03/14/2016   Procedure: RIGHT TOTAL KNEE ARTHROPLASTY;  Surgeon: Marybelle Killings, MD;  Location: [place];  Service: Orthopedics;  Laterality: Right;  Marland Kitchen VIDEO ASSISTED THORACOSCOPY Right 06/10/2014   Procedure: VIDEO  ASSISTED THORACOSCOPY;  Surgeon: Ivin Poot, MD;  Location: [facility] OR;  Service: Thoracic;  Laterality: Right;    Social History   Social History  . Marital status: Widowed    Spouse name: N/A  . Number of children: N/A  . Years of education: N/A   Occupational History  . disabled    Social History Main Topics  . Smoking status: Former Smoker    Packs/day: 0.25    Years: 15.00    Types: Cigarettes    Quit date: 02/08/1980  . Smokeless tobacco: Former Systems developer     Comment: 2 packs per week  . Alcohol use 0.0 oz/week     Comment: occassional/social/rare  . Drug use: No  . Sexual activity: No   Other Topics Concern  . Not on file   Social History Narrative  . No narrative on file    Mobility: Occasionally utilized a Rollator prior to admission Work history: Not obtained   Allergies  Allergen Reactions  . No Known Allergies     Family History  Problem Relation Age of Onset  . COPD Father   . Hypothyroidism Father   . Anemia Father     iron deficiency  . Cancer Mother 54    pancreatic     Prior to Admission medications   Medication Sig Start Date End Date Taking? Authorizing Provider  Clobetasol Prop Emollient Base (CLOBETASOL PROPIONATE E) 0.05 % emollient cream Apply 1 application topically daily as needed (skin irritations). 12/15/15  Yes [name] [name], NP  fluticasone (FLONASE) 50 MCG/ACT nasal spray PLACE 1 SPRAY INTO BOTH NOSTRILS DAILY. 01/12/16  Yes Chesley Mires, MD  furosemide (LASIX) 40 MG tablet TAKE 1 TABLET (40 MG TOTAL) BY MOUTH DAILY. Patient taking differently: Take 40 mg by mouth daily.  12/15/15  Yes [name] [name], NP  labetalol (NORMODYNE) 200 MG tablet Take 1 tablet (200 mg total) by mouth 2 (two) times daily. 12/15/15  Yes [name] [name], NP  lisinopril (PRINIVIL,ZESTRIL) 20 MG tablet Take 1 tablet (20 mg total) by mouth daily. 12/15/15  Yes [name] [name], NP  montelukast (SINGULAIR) 10 MG tablet TAKE 1 TABLET (10 MG TOTAL) BY  MOUTH AT BEDTIME. Patient taking differently: Take 10 mg by mouth at bedtime.  09/18/15  Yes Hoyt Koch, MD  naproxen sodium (ANAPROX) 220 MG tablet Take 220 mg by mouth 2 (two) times daily as needed (PAIN).   Yes Historical Provider, MD  potassium citrate (UROCIT-K) 10 MEQ (1080 MG) SR tablet Take 1 tablet (10 mEq total) by mouth daily. 12/15/15  Yes [name] [name], NP  sitaGLIPtin (JANUVIA) 100 MG tablet Take 1 tablet (100 mg total) by mouth daily. 12/15/15  Yes [name] [name], NP  albuterol (PROAIR HFA) 108 (90 Base) MCG/ACT inhaler Inhale 2 puffs into the lungs every 6 (six) hours as needed for wheezing or shortness of breath. 03/10/16   Flossie Buffy, NP  ALPRAZolam Duanne Moron) 0.25 MG tablet Take 1 tablet (0.25 mg total) by mouth 2 (two) times daily as needed for anxiety. 08/13/15   Real Cons  Sharlet Salina, MD  aspirin EC 325 MG EC tablet Take 1 tablet (325 mg total) by mouth daily with breakfast. 03/16/16   Lanae Crumbly, PA-C  atorvastatin (LIPITOR) 20 MG tablet Take 1 tablet (20 mg total) by mouth daily at 6 PM. 12/15/15   Flossie Buffy, NP  furosemide (LASIX) 80 MG tablet TAKE 1/2 TABLET BY MOUTH EVERY DAY 01/11/16   Hoyt Koch, MD  gabapentin (NEURONTIN) 300 MG capsule Take 1 capsule (300 mg total) by mouth 3 (three) times daily. Patient taking differently: Take 300 mg by mouth 2 (two) times daily as needed.  12/15/15   Flossie Buffy, NP  HYDROcodone-acetaminophen (NORCO) 7.5-325 MG tablet Take 1 tablet by mouth 2 (two) times daily as needed for moderate pain. 08/13/15   Hoyt Koch, MD  lisinopril (PRINIVIL,ZESTRIL) 20 MG tablet TAKE 1 TABLET BY MOUTH EVERY DAY 01/18/16   Hoyt Koch, MD  methocarbamol (ROBAXIN) 500 MG tablet Take 1 tablet (500 mg total) by mouth every 6 (six) hours as needed for muscle spasms. 03/16/16   Lanae Crumbly, PA-C  oxyCODONE-acetaminophen (ROXICET) 5-325 MG tablet Take 1 tablet by mouth every 6 (six) hours as needed for  severe pain. 03/16/16   Lanae Crumbly, PA-C  Suvorexant (BELSOMRA) 10 MG TABS Take 10 mg by mouth at bedtime. 08/24/15   Chesley Mires, MD    Physical Exam: Vitals:   03/16/16 2042 03/16/16 2115 03/17/16 0408 03/17/16 0745  BP: (!) 114/52 (!) 97/36 (!) 113/43   Pulse: 62 61 (!) 53 (!) 57  Resp: 20  18 18   Temp: 98.6 F (37 C)  97.5 F (36.4 C) 98.3 F (36.8 C)  TempSrc: Oral  Oral Oral  SpO2: 96%  96% 96%  Weight:          Constitutional: NAD, calm, comfortableIn semirecumbent position in bed Eyes: PERRL, lids and conjunctivae normal ENMT: Mucous membranes are moist. Posterior pharynx clear of any exudate or lesions.Normal dentition.  Neck: normal, supple, no masses, no thyromegaly Respiratory: Coarse to auscultation bilaterally, no wheezing, no crackles. Normal respiratory effort. No accessory muscle use. Room air saturations 96% Cardiovascular: Regular rate and rhythm, no murmurs / rubs / gallops. No extremity edema. 2+ pedal pulses. No carotid bruits. No JVD Abdomen: no tenderness,Slightly distended, no masses palpated. No hepatosplenomegaly. Bowel sounds positive.  Musculoskeletal: no clubbing / cyanosis. No joint deformity upper and lower extremities. Good ROM, no contractures. Normal muscle tone. Dressing intact right lateral knee Skin: no rashes, lesions, ulcers. No induration Neurologic: CN 2-12 grossly intact. Sensation intact, DTR normal. Strength 5/5 x all 4 extremities.  Psychiatric: Normal judgment and insight. Alert and oriented x 3. Normal mood.    Labs on Admission: I have personally reviewed following labs and imaging studies  CBC:  Recent Labs Lab 03/15/16 0513 03/16/16 0626 03/17/16 0400  WBC 6.3 7.7 8.8  HGB 9.6* 8.3* 8.8*  HCT 29.6* 25.9* 26.9*  MCV 87.8 88.1 87.3  PLT 198 187 998   Basic Metabolic Panel:  Recent Labs Lab 03/15/16 0513 03/16/16 0626 03/17/16 0400  NA 134* 133* 132*  K 4.7 4.2 4.2  CL 103 100* 97*  CO2 23 23 24   GLUCOSE 124*  109* 116*  BUN 19 30* 39*  CREATININE 1.34* 3.16* 3.28*  CALCIUM 8.9 8.8* 8.9   GFR: Estimated Creatinine Clearance: 22.9 mL/min (by C-G formula based on SCr of 3.28 mg/dL (H)). Liver Function Tests: No results for input(s): AST, ALT, ALKPHOS, BILITOT,  PROT, ALBUMIN in the last 168 hours. No results for input(s): LIPASE, AMYLASE in the last 168 hours. No results for input(s): AMMONIA in the last 168 hours. Coagulation Profile: No results for input(s): INR, PROTIME in the last 168 hours. Cardiac Enzymes: No results for input(s): CKTOTAL, CKMB, CKMBINDEX, TROPONINI in the last 168 hours. BNP (last 3 results) No results for input(s): PROBNP in the last 8760 hours. HbA1C: No results for input(s): HGBA1C in the last 72 hours. CBG:  Recent Labs Lab 03/15/16 2312 03/16/16 0719 03/16/16 1122 03/16/16 1712 03/17/16 0618  GLUCAP 131* 102* 106* 121* 105*   Lipid Profile: No results for input(s): CHOL, HDL, LDLCALC, TRIG, CHOLHDL, LDLDIRECT in the last 72 hours. Thyroid Function Tests: No results for input(s): TSH, T4TOTAL, FREET4, T3FREE, THYROIDAB in the last 72 hours. Anemia Panel: No results for input(s): VITAMINB12, FOLATE, FERRITIN, TIBC, IRON, RETICCTPCT in the last 72 hours. Urine analysis:    Component Value Date/Time   COLORURINE YELLOW 03/07/2016 1332   APPEARANCEUR HAZY (A) 03/07/2016 1332   LABSPEC 1.010 03/07/2016 1332   PHURINE 6.0 03/07/2016 1332   GLUCOSEU NEGATIVE 03/07/2016 1332   HGBUR NEGATIVE 03/07/2016 1332   BILIRUBINUR NEGATIVE 03/07/2016 1332   BILIRUBINUR negative 12/03/2015 0953   KETONESUR NEGATIVE 03/07/2016 1332   PROTEINUR NEGATIVE 03/07/2016 1332   UROBILINOGEN 0.2 12/03/2015 0953   UROBILINOGEN 0.2 06/06/2014 1418   NITRITE NEGATIVE 03/07/2016 1332   LEUKOCYTESUR LARGE (A) 03/07/2016 1332   Sepsis Labs: @LABRCNTIP (procalcitonin:4,lacticidven:4) ) Recent Results (from the past 240 hour(s))  Surgical pcr screen     Status: None    Collection Time: 03/07/16  1:31 PM  Result Value Ref Range Status   MRSA, PCR NEGATIVE NEGATIVE Final   Staphylococcus aureus NEGATIVE NEGATIVE Final    Comment:        The Xpert SA Assay (FDA approved for NASAL specimens in patients over 49 years of age), is one component of a comprehensive surveillance program.  Test performance has been validated by [facility] for patients greater than or equal to 8 year old. It is not intended to diagnose infection nor to guide or monitor treatment.      Radiological Exams on Admission: No results found.   Assessment/Plan Principal Problem:   Acute kidney injury on CKD (chronic kidney disease) stage 3, GFR 30-34ml/min -Baseline creatinine 1.34 ml/min -Current creatinine 3.28 -Suspect concomitant relative hypotension in the postoperative period with continued use of antihypertensive medications including diuretic and ACE inhibitor contributing to acute kidney injury -Likely has underlying diabetic nephropathy -Patient reports has had difficulty voiding with very little urinary output despite having received Lasix during the hospitalization -check renal ultrasound to rule out obstructive process vs acute urinary retention postoperatively (anesthesia, muscle relaxers, narcotic pain medications) -Bedside bladder scan -1.8 L positive -IV fluids at 100 mL per hour -Check orthostatic vital signs -Minimize recurrent episodes of hypotension therefore holding ACE inhibitor, diuretic and beta blocker -Obtain urinalysis, cath specimen if necessary -Follow labs  Active Problems:   Symptomatic anemia -Baseline hemoglobin 12.8 -Hemoglobin nadir 8.3 -Today's hemoglobin 8.8 after transfusion of 1 unit PRBCs -No signs or symptoms reported consistent with GI bleeding -EBL reported at 150 -CBC posttransfusion -Transfuse 2 units PRBCs today-decrease maintenance fluid to kvo during blood product infusion    OSA (obstructive sleep apnea) -Continue  nocturnal CPAP    Chronic diastolic heart failure  -Appears compensated based on clinical exam -Holding ACE inhibitor and diuretics secondary to acute kidney injury    Controlled  steroid-induced diabetes mellitus  -CBGs in the 100s on combination of meal coverage and Januvia    Essential hypertension -Relative hypotension as described above -Holding lisinopril, Normodyne and Lasix    ILD (interstitial lung disease) 2/2 MAI s/p med rxn -Patient reports successful treatment and eradication of ILD per report from pulmonologist    Hyperlipidemia LDL goal <70 -Continue statin    Arthritis of right knee/S/P total knee arthroplasty, right -At discretion of orthopedic service -Discharge disposition is to skilled nursing facility when medically stable         ELLIS,ALLISON L. ANP-BC [facility] Pager [phone]   If 7PM-7AM, please contact night-coverage [email or web address] Password [facility]  03/17/2016, 9:39 AM

## 2016-03-18 DIAGNOSIS — E099 Drug or chemical induced diabetes mellitus without complications: Secondary | ICD-10-CM

## 2016-03-18 DIAGNOSIS — D62 Acute posthemorrhagic anemia: Secondary | ICD-10-CM

## 2016-03-18 DIAGNOSIS — I5032 Chronic diastolic (congestive) heart failure: Secondary | ICD-10-CM

## 2016-03-18 DIAGNOSIS — N179 Acute kidney failure, unspecified: Secondary | ICD-10-CM

## 2016-03-18 DIAGNOSIS — T380X5A Adverse effect of glucocorticoids and synthetic analogues, initial encounter: Secondary | ICD-10-CM

## 2016-03-18 LAB — CBC WITH DIFFERENTIAL/PLATELET
BASOS PCT: 0 %
Basophils Absolute: 0 10*3/uL (ref 0.0–0.1)
Eosinophils Absolute: 0.3 10*3/uL (ref 0.0–0.7)
Eosinophils Relative: 4 %
HCT: 30.5 % — ABNORMAL LOW (ref 36.0–46.0)
HEMOGLOBIN: 10.1 g/dL — AB (ref 12.0–15.0)
LYMPHS ABS: 1.9 10*3/uL (ref 0.7–4.0)
LYMPHS PCT: 24 %
MCH: 28.7 pg (ref 26.0–34.0)
MCHC: 33.1 g/dL (ref 30.0–36.0)
MCV: 86.6 fL (ref 78.0–100.0)
MONOS PCT: 7 %
Monocytes Absolute: 0.5 10*3/uL (ref 0.1–1.0)
NEUTROS ABS: 5.2 10*3/uL (ref 1.7–7.7)
NEUTROS PCT: 65 %
Platelets: 188 10*3/uL (ref 150–400)
RBC: 3.52 MIL/uL — ABNORMAL LOW (ref 3.87–5.11)
RDW: 14.9 % (ref 11.5–15.5)
WBC: 7.9 10*3/uL (ref 4.0–10.5)

## 2016-03-18 LAB — GLUCOSE, CAPILLARY
GLUCOSE-CAPILLARY: 88 mg/dL (ref 65–99)
GLUCOSE-CAPILLARY: 108 mg/dL — AB (ref 65–99)
Glucose-Capillary: 97 mg/dL (ref 65–99)

## 2016-03-18 LAB — BASIC METABOLIC PANEL
ANION GAP: 10 (ref 5–15)
BUN: 37 mg/dL — ABNORMAL HIGH (ref 6–20)
CO2: 23 mmol/L (ref 22–32)
Calcium: 8.8 mg/dL — ABNORMAL LOW (ref 8.9–10.3)
Chloride: 100 mmol/L — ABNORMAL LOW (ref 101–111)
Creatinine, Ser: 2.18 mg/dL — ABNORMAL HIGH (ref 0.44–1.00)
GFR calc non Af Amer: 23 mL/min — ABNORMAL LOW (ref >60)
GFR, EST AFRICAN AMERICAN: 26 mL/min — AB (ref >60)
Glucose, Bld: 92 mg/dL (ref 65–99)
Potassium: 4.4 mmol/L (ref 3.5–5.1)
Sodium: 133 mmol/L — ABNORMAL LOW (ref 135–145)

## 2016-03-18 LAB — TYPE AND SCREEN
BLOOD PRODUCT EXPIRATION DATE: 201803042359
BLOOD PRODUCT EXPIRATION DATE: 201803062359
Blood Product Expiration Date: 201803042359
ISSUE DATE / TIME: 201802081658
ISSUE DATE / TIME: 201802071642
ISSUE DATE / TIME: 201802081357
UNIT TYPE AND RH: 5100
Unit Type and Rh: 5100
Unit Type and Rh: 5100

## 2016-03-18 NOTE — Progress Notes (Signed)
Occupational Therapy Treatment Patient Details Name: Stacey Page MRN: [id number] DOB: 1950-12-18 Today's Date: 03/18/2016    History of present illness 66 y.o. female admitted to [facility] on 03/14/16 for elective R TKA.  Pt with significant PMHx of peripheral neuropathy, HTN, DM, COPD, CHF, and R lung biopsy.     OT comments  Pt making progress towards OT goals this session. Pt needed encouragement to participate in therapy. Please see ADL and transfer sections below. Pt continues to require SNF level therapy upon dc to ensure safety and return to PLOF. Next session to focus on energy conservation education and AE education for LB bathing/dressing.   Follow Up Recommendations  SNF;Supervision/Assistance - 24 hour    Equipment Recommendations  Other (comment) (defer to next venue - Pt REQUIRES BARIATRIC)    Recommendations for Other Services      Precautions / Restrictions Precautions Precautions: Knee Precaution Comments: knee exercise handout given, no pillow under operative knee reviewed Required Braces or Orthoses: Knee Immobilizer - Right Knee Immobilizer - Right: On except when in CPM Restrictions Weight Bearing Restrictions: Yes RLE Weight Bearing: Weight bearing as tolerated       Mobility Bed Mobility Overal bed mobility: Needs Assistance Bed Mobility: Supine to Sit     Supine to sit: Min assist     General bed mobility comments: heavy use of rails and increased time/effort  Transfers Overall transfer level: Needs assistance Equipment used: Rolling walker (2 wheeled) Transfers: Sit to/from Stand Sit to Stand: Min assist         General transfer comment: cues for hand placement and technique; pt used momentum to power up and pushed from bed rail    Balance Overall balance assessment: Needs assistance Sitting-balance support: Feet supported;Bilateral upper extremity supported Sitting balance-Leahy Scale: Good     Standing balance support: Bilateral upper  extremity supported Standing balance-Leahy Scale: Poor                     ADL Overall ADL's : Needs assistance/impaired     Grooming: Applying deodorant;Wash/dry face;Set up;Sitting   Upper Body Bathing: Moderate assistance;Sitting Upper Body Bathing Details (indicate cue type and reason): assist for back, educated Pt in use of long handle sponge Lower Body Bathing: Maximal assistance;Sit to/from stand Lower Body Bathing Details (indicate cue type and reason): Assist to don lotion on BLE         Toilet Transfer: Minimal assistance;Ambulation;BSC;Requires wide/bariatric;RW Toilet Transfer Details (indicate cue type and reason): simulated through recliner         Functional mobility during ADLs: Min guard;Rolling walker;Cueing for sequencing        Vision                     Perception     Praxis      Cognition   Behavior During Therapy: [facility] for tasks assessed/performed Overall Cognitive Status: Within Functional Limits for tasks assessed                       Extremity/Trunk Assessment               Exercises    Shoulder Instructions       General Comments      Pertinent Vitals/ Pain       Pain Assessment: Faces Faces Pain Scale: Hurts little more Pain Location: right knee Pain Descriptors / Indicators: Grimacing;Moaning;Sore Pain Intervention(s): Monitored during session;Premedicated before session;Ice applied;Repositioned  Home  Living                                          Prior Functioning/Environment              Frequency  Min 2X/week        Progress Toward Goals  OT Goals(current goals can now be found in the care plan section)  Progress towards OT goals: Progressing toward goals  Acute Rehab OT Goals Patient Stated Goal: to go to rehab before going home so she can be more independent.  OT Goal Formulation: With patient Time For Goal Achievement: 03/22/16 Potential to Achieve  Goals: Good  Plan Discharge plan remains appropriate    Co-evaluation    PT/OT/SLP Co-Evaluation/Treatment: Yes Reason for Co-Treatment: For patient/therapist safety;Other (comment) (for Pt tolerance ) PT goals addressed during session: Mobility/safety with mobility OT goals addressed during session: ADL's and self-care      End of Session Equipment Utilized During Treatment: Gait belt;Rolling walker   Activity Tolerance Patient tolerated treatment well   Patient Left in chair;with call bell/phone within reach;Other (comment) (in bone foam)   Nurse Communication Mobility status;Other (comment) (in bone foam (Pt attempting to stay in for 30 min))        Time: 1447-1520 OT Time Calculation (min): 33 min  Charges: OT General Charges $OT Visit: 1 Procedure OT Treatments $Self Care/Home Management : 8-22 mins  Merri Ray Jarren Para 03/18/2016, 3:56 PM Hulda Humphrey OTR/L [phone]

## 2016-03-18 NOTE — Progress Notes (Signed)
Physical Therapy Treatment Patient Details Name: Stacey Page MRN: [id number] DOB: 04-12-1950 Today's Date: 03/18/2016    History of Present Illness 66 y.o. female admitted to [facility] on 03/14/16 for elective R TKA.  Pt with significant PMHx of peripheral neuropathy, HTN, DM, COPD, CHF, and R lung biopsy.      PT Comments    Patient is making progress toward mobility goals. Encouragement needed to participate but agreeable. Pt tolerated increased ambulation. Continue to progress as tolerated with anticipated d/c to SNF for further skilled PT services.    Follow Up Recommendations  SNF     Equipment Recommendations  None recommended by PT    Recommendations for Other Services       Precautions / Restrictions Precautions Precautions: Knee Precaution Comments: knee exercise handout given, no pillow under operative knee reviewed Required Braces or Orthoses: Knee Immobilizer - Right Knee Immobilizer - Right: On except when in CPM Restrictions Weight Bearing Restrictions: Yes RLE Weight Bearing: Weight bearing as tolerated    Mobility  Bed Mobility Overal bed mobility: Needs Assistance Bed Mobility: Supine to Sit     Supine to sit: Min assist     General bed mobility comments: heavy use of rails and increased time/effort  Transfers Overall transfer level: Needs assistance Equipment used: Rolling walker (2 wheeled) Transfers: Sit to/from Stand Sit to Stand: Min assist         General transfer comment: cues for hand placement and technique; pt used momentum to power up and pushed from bed rail  Ambulation/Gait Ambulation/Gait assistance: Min assist Ambulation Distance (Feet): 65 Feet Assistive device: Rolling walker (2 wheeled) Gait Pattern/deviations: Step-to pattern;Antalgic;Decreased stance time - right;Decreased step length - left;Decreased weight shift to right Gait velocity: decreased   General Gait Details: cues for sequencing, increased L step length/height,  and safe proximity of RW; pt with decreased L foot clearance; pt emerging to step through pattern    Stairs            Wheelchair Mobility    Modified Rankin (Stroke Patients Only)       Balance Overall balance assessment: Needs assistance Sitting-balance support: Feet supported;Bilateral upper extremity supported Sitting balance-Leahy Scale: Good     Standing balance support: Bilateral upper extremity supported Standing balance-Leahy Scale: Poor                      Cognition Arousal/Alertness: Awake/alert Behavior During Therapy: WFL for tasks assessed/performed Overall Cognitive Status: Within Functional Limits for tasks assessed                      Exercises Total Joint Exercises Heel Slides: AAROM;Right;10 reps Long Arc Quad: AROM;Right;10 reps;Seated Goniometric ROM: 10-70     General Comments General comments (skin integrity, edema, etc.): encouraged pt to perform therex 3X day      Pertinent Vitals/Pain Pain Assessment: Faces Faces Pain Scale: Hurts little more Pain Location: right knee Pain Descriptors / Indicators: Grimacing;Moaning;Sore Pain Intervention(s): Limited activity within patient's tolerance;Monitored during session    Home Living                      Prior Function            PT Goals (current goals can now be found in the care plan section) Acute Rehab PT Goals Patient Stated Goal: to go to rehab before going home so she can be more independent.  Progress towards PT goals: Progressing toward  goals    Frequency    7X/week      PT Plan Current plan remains appropriate    Co-evaluation PT/OT/SLP Co-Evaluation/Treatment: Yes Reason for Co-Treatment: For patient/therapist safety PT goals addressed during session: Mobility/safety with mobility;Proper use of DME (pt's activity tolerance)       End of Session Equipment Utilized During Treatment: Gait belt Activity Tolerance: Patient tolerated  treatment well Patient left: with call bell/phone within reach;in chair     Time: 1445-1515 PT Time Calculation (min) (ACUTE ONLY): 30 min  Charges:  $Gait Training: 8-22 mins                    G Codes:      Salina April, PTA Pager: [phone]   03/18/2016, 3:28 PM

## 2016-03-18 NOTE — Progress Notes (Signed)
PROGRESS NOTE        PATIENT DETAILS Name: Stacey Page Age: 66 y.o. Sex: female Date of Birth: September 26, 1950 Admit Date: 03/14/2016 Admitting Physician Marybelle Killings, MD CNO:[id number] [name], NP  Brief Narrative: Patient is a 66 y.o. female with past medical history of chronic kidney disease stage III, chronic diastolic heart failure-who was admitted on 2/5 for a right total knee arthroplasty, her hospital course was complicated by development of transient hypotension along with acute blood loss anemia. She was subsequently found to have acute on chronic kidney failure, [facility] service was consulted for assistance in managing her renal failure.  Subjective: Feels much better-lying comfortably in bed. Denies any chest pain or shortness of breath. She is anxious to be discharged from the hospital.  Assessment/Plan: Acute on chronic renal failure stage III: Acute renal failure probably secondary to hemodynamic mediated injury-due to diuretic, ACE inhibitor and Toradol use, compounded by soft blood pressure and acute blood loss anemia. Would continue to avoid nephrotoxic agents including ACE inhibitor, diuretics and Toradol for now. Her creatinine is down trending with supportive measures. Suspect that if her creatinine continues to improve, she could be discharged on 2/10. We will continue to follow.  Perioperative blood loss anemia: Significantly improved post transfusion. Continue to monitor periodically.  Chronic diastolic heart failure: Continues to be clinically compensated-continue to hold diuretic and ACE inhibitor. We'll reassess on 2/10 to determine timing to restart these agents.  OSA: Continue CPAP daily at bedtime  Type 2 diabetes: CBGs stable-continue SSI and Tradjenta-resume usual oral hypoglycemic regimen on discharge.  Hypertension: Had soft blood pressures over the past few days-blood pressure remains stable-continue to hold  antihypertensives today-suspect we could resume tomorrow.  Dyslipidemia: Continue statin  Status post total right knee arthroplasty: Deferred to orthopedics  DVT Prophylaxis: Deferred to primary service  Code Status: Full code   Family Communication: None at bedside  Disposition Plan: Defer to primary service  Antimicrobial agents: Anti-infectives    Start     Dose/Rate Route Frequency Ordered Stop   03/14/16 1215  ceFAZolin (ANCEF) IVPB 2g/100 mL premix     2 g 200 mL/hr over 30 Minutes Intravenous On call to O.R. 03/14/16 1102 03/14/16 1250   03/14/16 1105  ceFAZolin (ANCEF) 2-4 GM/100ML-% IVPB    Comments:  Henrine Screws   : cabinet override      03/14/16 1105 03/14/16 1250      Procedures: None   Time spent: 25 minutes-Greater than 50% of this time was spent in counseling, explanation of diagnosis, planning of further management, and coordination of care.  MEDICATIONS: Scheduled Meds: . aspirin EC  325 mg Oral Q breakfast  . atorvastatin  20 mg Oral q1800  . bisacodyl  10 mg Rectal Once  . docusate sodium  100 mg Oral BID  . fluticasone  1 spray Each [name] Daily  . insulin aspart  0-20 Units Subcutaneous TID WC  . linagliptin  5 mg Oral Daily  . montelukast  10 mg Oral QHS  . potassium citrate  10 mEq Oral Daily   Continuous Infusions: . sodium chloride 100 mL/hr at 03/17/16 0930  . lactated ringers 10 mL/hr at 03/14/16 1115   PRN Meds:.acetaminophen **OR** acetaminophen, albuterol, ALPRAZolam, gabapentin, HYDROmorphone (DILAUDID) injection, menthol-cetylpyridinium **OR** phenol, methocarbamol **OR** methocarbamol (ROBAXIN)  IV, metoCLOPramide **OR** metoCLOPramide (REGLAN) injection, ondansetron **OR** ondansetron (  ZOFRAN) IV, oxyCODONE, polyethylene glycol   PHYSICAL EXAM: Vital signs: Vitals:   03/17/16 1732 03/17/16 1933 03/17/16 2025 03/18/16 0500  BP:  (!) 108/39 (!) 112/39 (!) 153/54  Pulse: 62 69 66 62  Resp: 18 16 18 16   Temp: 98.2 F (36.8  C) 98.6 F (37 C) 98.2 F (36.8 C) 97.7 F (36.5 C)  TempSrc: Oral Oral Oral Oral  SpO2: 99% 97% 100% 98%  Weight:       Filed Weights   03/11/16 1149 03/14/16 1045  Weight: 113.4 kg (250 lb) 113.4 kg (250 lb)   Body mass index is 38.01 kg/m (pended).   General appearance :Awake, alert, not in any distress.  Eyes:, pupils equally reactive to light and accomodation,no scleral icterus. HEENT: Atraumatic and Normocephalic Neck: supple, no JVD. No cervical lymphadenopathy. No thyromegaly Resp:Good air entry bilaterally, no added sounds  CVS: S1 S2 regular GI: Bowel sounds present, Non tender and not distended with no gaurding, rigidity or rebound.No organomegaly Extremities: B/L Lower Ext shows no edema, both legs are warm to touch Neurology:  speech clear,Non focal, sensation is grossly intact. Psychiatric: Normal judgment and insight Musculoskeletal:No digital cyanosis Skin:No Rash, warm and dry Wounds:N/A  I have personally reviewed following labs and imaging studies  LABORATORY DATA: CBC:  Recent Labs Lab 03/15/16 0513 03/16/16 0626 03/17/16 0400 03/17/16 2149 03/18/16 0446  WBC 6.3 7.7 8.8  --  7.9  NEUTROABS  --   --   --   --  5.2  HGB 9.6* 8.3* 8.8* 10.0* 10.1*  HCT 29.6* 25.9* 26.9* 30.4* 30.5*  MCV 87.8 88.1 87.3  --  86.6  PLT 198 187 159  --  324    Basic Metabolic Panel:  Recent Labs Lab 03/15/16 0513 03/16/16 0626 03/17/16 0400 03/18/16 0446  NA 134* 133* 132* 133*  K 4.7 4.2 4.2 4.4  CL 103 100* 97* 100*  CO2 23 23 24 23   GLUCOSE 124* 109* 116* 92  BUN 19 30* 39* 37*  CREATININE 1.34* 3.16* 3.28* 2.18*  CALCIUM 8.9 8.8* 8.9 8.8*    GFR: Estimated Creatinine Clearance: 34.4 mL/min (by C-G formula based on SCr of 2.18 mg/dL (H)).  Liver Function Tests: No results for input(s): AST, ALT, ALKPHOS, BILITOT, PROT, ALBUMIN in the last 168 hours. No results for input(s): LIPASE, AMYLASE in the last 168 hours. No results for input(s):  AMMONIA in the last 168 hours.  Coagulation Profile: No results for input(s): INR, PROTIME in the last 168 hours.  Cardiac Enzymes: No results for input(s): CKTOTAL, CKMB, CKMBINDEX, TROPONINI in the last 168 hours.  BNP (last 3 results) No results for input(s): PROBNP in the last 8760 hours.  HbA1C: No results for input(s): HGBA1C in the last 72 hours.  CBG:  Recent Labs Lab 03/17/16 0618 03/17/16 1253 03/17/16 1704 03/17/16 2145 03/18/16 0635  GLUCAP 105* 91 104* 123* 88    Lipid Profile: No results for input(s): CHOL, HDL, LDLCALC, TRIG, CHOLHDL, LDLDIRECT in the last 72 hours.  Thyroid Function Tests: No results for input(s): TSH, T4TOTAL, FREET4, T3FREE, THYROIDAB in the last 72 hours.  Anemia Panel: No results for input(s): VITAMINB12, FOLATE, FERRITIN, TIBC, IRON, RETICCTPCT in the last 72 hours.  Urine analysis:    Component Value Date/Time   COLORURINE YELLOW 03/17/2016 1318   APPEARANCEUR CLEAR 03/17/2016 1318   LABSPEC 1.014 03/17/2016 1318   PHURINE 5.0 03/17/2016 1318   GLUCOSEU NEGATIVE 03/17/2016 1318   HGBUR SMALL (A) 03/17/2016 1318  BILIRUBINUR NEGATIVE 03/17/2016 1318   BILIRUBINUR negative 12/03/2015 0953   KETONESUR NEGATIVE 03/17/2016 1318   PROTEINUR NEGATIVE 03/17/2016 1318   UROBILINOGEN 0.2 12/03/2015 0953   UROBILINOGEN 0.2 06/06/2014 1418   NITRITE NEGATIVE 03/17/2016 1318   LEUKOCYTESUR NEGATIVE 03/17/2016 1318    Sepsis Labs: Lactic Acid, Venous No results found for: LATICACIDVEN  MICROBIOLOGY: No results found for this or any previous visit (from the past 240 hour(s)).  RADIOLOGY STUDIES/RESULTS: Dg Knee 1-2 Views Right  Result Date: 03/14/2016 CLINICAL DATA:  Right knee arthroplasty. EXAM: RIGHT KNEE - 1-2 VIEW COMPARISON:  Right knee radiographs 05/27/2015. FINDINGS: A right total knee arthroplasty is noted. Right-sided joint effusion is present, as expected. Joint spacing is symmetric. IMPRESSION: Right total knee  arthroplasty without radiographic evidence for complication. Electronically Signed   By: San Morelle M.D.   On: 03/14/2016 15:55   US Renal  Result Date: 03/17/2016 CLINICAL DATA:  Acute kidney injury EXAM: RENAL / URINARY TRACT ULTRASOUND COMPLETE COMPARISON:  None. FINDINGS: Right Kidney: Length: 10.3 cm. Small cysts in the mid and upper pole, the largest 12 mm. No hydronephrosis. Normal echotexture. Left Kidney: Length: 10.8 cm. 11 mm cyst in the midpole. Normal echotexture. No hydronephrosis. Bladder: Appears normal for degree of bladder distention. IMPRESSION: No acute findings.  No hydronephrosis. Electronically Signed   By: Rolm Baptise M.D.   On: 03/17/2016 11:35   Xr Knee 1-2 Views Left  Result Date: 02/23/2016 AP lateral left knee shows end-stage osteoarthritis bone-on-bone medial compartment marginal osteophyte subchondral sclerosis. Impression: Tricompartmental osteoarthritis left knee.  Xr Knee 1-2 Views Right  Result Date: 02/23/2016 Standing AP and lateral right knee obtained. This shows end-stage osteoarthritis tricompartmental with large osteophyte subchondral sclerosis flattening of the femoral condyle subchondral cyst formation. Impression  : severe primary osteoarthritis right knee. No evidence of acute fracture.    LOS: 4 days   Oren Binet, MD  [facility] Pager:[phone]  If 7PM-7AM, please contact night-coverage [email or web address] Password [email or web address] 03/18/2016, 11:17 AM

## 2016-03-18 NOTE — Progress Notes (Signed)
Pt had home CPAP that she place herself on and off.

## 2016-03-18 NOTE — Progress Notes (Signed)
Subjective: 4 Days Post-Op Procedure(s) (LRB): RIGHT TOTAL KNEE ARTHROPLASTY (Right) Doing much better.  Pain controlled right knee.     Objective: Vital signs in last 24 hours: Temp:  [97.7 F (36.5 C)-98.6 F (37 C)] 97.7 F (36.5 C) (02/09 0500) Pulse Rate:  [57-69] 62 (02/09 0500) Resp:  [16-20] 16 (02/09 0500) BP: (82-153)/(32-54) 153/54 (02/09 0500) SpO2:  [97 %-100 %] 98 % (02/09 0500)  Intake/Output from previous day: 02/08 0701 - 02/09 0700 In: 1915 [P.O.:360; I.V.:550; Blood:1005] Out: 1100 [Urine:1100] Intake/Output this shift: No intake/output data recorded.   Recent Labs  03/16/16 0626 03/17/16 0400 03/17/16 2149 03/18/16 0446  HGB 8.3* 8.8* 10.0* 10.1*    Recent Labs  03/17/16 0400 03/17/16 2149 03/18/16 0446  WBC 8.8  --  7.9  RBC 3.08*  --  3.52*  HCT 26.9* 30.4* 30.5*  PLT 159  --  188    Recent Labs  03/17/16 0400 03/18/16 0446  NA 132* 133*  K 4.2 4.4  CL 97* 100*  CO2 24 23  BUN 39* 37*  CREATININE 3.28* 2.18*  GLUCOSE 116* 92  CALCIUM 8.9 8.8*   No results for input(s): LABPT, INR in the last 72 hours.  Exam:  Pleasant female, alert and oriented. NAD.  Knee wound looks good.  steris intact.  No drainage or signs of infection.  Calf nontender.  NVI.    Assessment/Plan: 4 Days Post-Op Procedure(s) (LRB): RIGHT TOTAL KNEE ARTHROPLASTY (Right)  Greatly appreciate medicine team assistance.  Stable from ortho standpoint.  Transfer to SNF when cleared by medicine team.    Benjiman Core 03/18/2016, 8:20 AM

## 2016-03-19 DIAGNOSIS — J849 Interstitial pulmonary disease, unspecified: Secondary | ICD-10-CM | POA: Diagnosis not present

## 2016-03-19 DIAGNOSIS — I10 Essential (primary) hypertension: Secondary | ICD-10-CM | POA: Diagnosis not present

## 2016-03-19 DIAGNOSIS — D649 Anemia, unspecified: Secondary | ICD-10-CM | POA: Diagnosis not present

## 2016-03-19 DIAGNOSIS — G4733 Obstructive sleep apnea (adult) (pediatric): Secondary | ICD-10-CM | POA: Diagnosis not present

## 2016-03-19 DIAGNOSIS — E099 Drug or chemical induced diabetes mellitus without complications: Secondary | ICD-10-CM | POA: Diagnosis not present

## 2016-03-19 DIAGNOSIS — Z4789 Encounter for other orthopedic aftercare: Secondary | ICD-10-CM | POA: Diagnosis not present

## 2016-03-19 DIAGNOSIS — M25569 Pain in unspecified knee: Secondary | ICD-10-CM | POA: Diagnosis not present

## 2016-03-19 DIAGNOSIS — E0965 Drug or chemical induced diabetes mellitus with hyperglycemia: Secondary | ICD-10-CM | POA: Diagnosis not present

## 2016-03-19 DIAGNOSIS — R531 Weakness: Secondary | ICD-10-CM | POA: Diagnosis not present

## 2016-03-19 DIAGNOSIS — D62 Acute posthemorrhagic anemia: Secondary | ICD-10-CM | POA: Diagnosis not present

## 2016-03-19 DIAGNOSIS — M6281 Muscle weakness (generalized): Secondary | ICD-10-CM | POA: Diagnosis not present

## 2016-03-19 DIAGNOSIS — M1711 Unilateral primary osteoarthritis, right knee: Secondary | ICD-10-CM | POA: Diagnosis not present

## 2016-03-19 DIAGNOSIS — E785 Hyperlipidemia, unspecified: Secondary | ICD-10-CM | POA: Diagnosis not present

## 2016-03-19 DIAGNOSIS — N179 Acute kidney failure, unspecified: Secondary | ICD-10-CM | POA: Diagnosis not present

## 2016-03-19 DIAGNOSIS — F419 Anxiety disorder, unspecified: Secondary | ICD-10-CM | POA: Diagnosis not present

## 2016-03-19 DIAGNOSIS — I5032 Chronic diastolic (congestive) heart failure: Secondary | ICD-10-CM | POA: Diagnosis not present

## 2016-03-19 LAB — BASIC METABOLIC PANEL
ANION GAP: 10 (ref 5–15)
BUN: 21 mg/dL — ABNORMAL HIGH (ref 6–20)
CALCIUM: 8.9 mg/dL (ref 8.9–10.3)
CO2: 24 mmol/L (ref 22–32)
CREATININE: 1.27 mg/dL — AB (ref 0.44–1.00)
Chloride: 101 mmol/L (ref 101–111)
GFR calc Af Amer: 51 mL/min — ABNORMAL LOW (ref >60)
GFR, EST NON AFRICAN AMERICAN: 44 mL/min — AB (ref >60)
GLUCOSE: 105 mg/dL — AB (ref 65–99)
Potassium: 4.2 mmol/L (ref 3.5–5.1)
Sodium: 135 mmol/L (ref 135–145)

## 2016-03-19 LAB — GLUCOSE, CAPILLARY: Glucose-Capillary: 93 mg/dL (ref 65–99)

## 2016-03-19 NOTE — Discharge Summary (Signed)
Patient ID: Stacey Page MRN: [id number] DOB/AGE: 1950-05-18 66 y.o.  Admit date: 03/14/2016 Discharge date: 03/19/2016  Admission Diagnoses:  Principal Problem:   Acute kidney injury [facility]) Active Problems:   OSA (obstructive sleep apnea)   Chronic diastolic heart failure (HCC)   Controlled steroid-induced diabetes mellitus ([place])   Essential hypertension   ILD (interstitial lung disease) 2/2 MAI s/p med rxn   Hyperlipidemia LDL goal <70   Primary osteoarthritis of right knee   Arthritis of right knee   CKD (chronic kidney disease) stage 3, GFR 30-59 ml/min   Symptomatic anemia   S/P total knee arthroplasty, right   Acute blood loss anemia   Discharge Diagnoses:  Same  Past Medical History:  Diagnosis Date  . Anxiety    doesn't take any meds  . Asthma   . CHF (congestive heart failure) (HCC)    takes Furosemide daily  . COPD (chronic obstructive pulmonary disease) (HCC)    Albuterol as needed  . Depression    doesn't take meds  . Diabetes ([place])    takes Januvia daily  . Eczema    uses cream as needed  . GERD (gastroesophageal reflux disease)   . History of bronchitis as a child   . HTN (hypertension)    takes Lisinopril and Labetalol daily  . Insomnia   . Joint pain   . Joint swelling   . OA (osteoarthritis)   . OSA (obstructive sleep apnea) 02/25/2014   wears CPAP at night  . Peripheral neuropathy (HCC)    takes Gabapentin as needed  . Pneumonia    hx of-2010  . Seasonal allergies    uses Flonase daily    Surgeries: Procedure(s): RIGHT TOTAL KNEE ARTHROPLASTY on 03/14/2016   Consultants: Hospitalitis   Discharged Condition: Improved  Hospital Course: Stacey Page is an 66 y.o. female who was admitted 03/14/2016 for operative treatment ofAcute kidney injury ([place]). Patient has severe unremitting pain that affects sleep, daily activities, and work/hobbies. After pre-op clearance the patient was taken to the operating room on 03/14/2016 and underwent   Procedure(s): RIGHT TOTAL KNEE ARTHROPLASTY.    Patient was given perioperative antibiotics: Anti-infectives    Start     Dose/Rate Route Frequency Ordered Stop   03/14/16 1215  ceFAZolin (ANCEF) IVPB 2g/100 mL premix     2 g 200 mL/hr over 30 Minutes Intravenous On call to O.R. 03/14/16 1102 03/14/16 1250   03/14/16 1105  ceFAZolin (ANCEF) 2-4 GM/100ML-% IVPB    Comments:  Henrine Screws   : cabinet override      03/14/16 1105 03/14/16 1250       Patient was given sequential compression devices, early ambulation, and chemoprophylaxis to prevent DVT. Patient developed transient hypotension along with acute blood loss anemia. Developed acute on Chronic kidney failure. Required blood transfusion.  Renal function trending towards normal . Needs repeat labs in one week to check BUN and Creatine.   Recent vital signs: Patient Vitals for the past 24 hrs:  BP Temp Temp src Pulse Resp SpO2  03/19/16 0613 (!) 171/57 97.8 F (36.6 C) Oral 62 - 99 %  03/18/16 2019 (!) 165/60 98.5 F (36.9 C) Oral 83 - 95 %  03/18/16 1409 (!) 131/59 99.2 F (37.3 C) Axillary 66 16 100 %     Recent laboratory studies:  Recent Labs  03/17/16 0400 03/17/16 2149 03/18/16 0446 03/19/16 0528  WBC 8.8  --  7.9  --   HGB 8.8* 10.0* 10.1*  --  HCT 26.9* 30.4* 30.5*  --   PLT 159  --  188  --   NA 132*  --  133* 135  K 4.2  --  4.4 4.2  CL 97*  --  100* 101  CO2 24  --  23 24  BUN 39*  --  37* 21*  CREATININE 3.28*  --  2.18* 1.27*  GLUCOSE 116*  --  92 105*  CALCIUM 8.9  --  8.8* 8.9     Discharge Medications:   Allergies as of 03/19/2016      Reactions   No Known Allergies       Medication List    STOP taking these medications   HYDROcodone-acetaminophen 7.5-325 MG tablet Commonly known as:  NORCO   naproxen sodium 220 MG tablet Commonly known as:  ANAPROX   Suvorexant 10 MG Tabs Commonly known as:  BELSOMRA     TAKE these medications   albuterol 108 (90 Base) MCG/ACT inhaler Commonly  known as:  PROAIR HFA Inhale 2 puffs into the lungs every 6 (six) hours as needed for wheezing or shortness of breath.   ALPRAZolam 0.25 MG tablet Commonly known as:  XANAX Take 1 tablet (0.25 mg total) by mouth 2 (two) times daily as needed for anxiety.   aspirin 325 MG EC tablet Take 1 tablet (325 mg total) by mouth daily with breakfast.   atorvastatin 20 MG tablet Commonly known as:  LIPITOR Take 1 tablet (20 mg total) by mouth daily at 6 PM.   Clobetasol Prop Emollient Base 0.05 % emollient cream Commonly known as:  CLOBETASOL PROPIONATE E Apply 1 application topically daily as needed (skin irritations).   fluticasone 50 MCG/ACT nasal spray Commonly known as:  FLONASE PLACE 1 SPRAY INTO BOTH NOSTRILS DAILY.   furosemide 40 MG tablet Commonly known as:  LASIX TAKE 1 TABLET (40 MG TOTAL) BY MOUTH DAILY. What changed:  how much to take  how to take this  when to take this  additional instructions  Another medication with the same name was removed. Continue taking this medication, and follow the directions you see here.   gabapentin 300 MG capsule Commonly known as:  NEURONTIN Take 1 capsule (300 mg total) by mouth 3 (three) times daily. What changed:  when to take this  reasons to take this   labetalol 200 MG tablet Commonly known as:  NORMODYNE Take 1 tablet (200 mg total) by mouth 2 (two) times daily.   lisinopril 20 MG tablet Commonly known as:  PRINIVIL,ZESTRIL Take 1 tablet (20 mg total) by mouth daily. What changed:  Another medication with the same name was removed. Continue taking this medication, and follow the directions you see here.   methocarbamol 500 MG tablet Commonly known as:  ROBAXIN Take 1 tablet (500 mg total) by mouth every 6 (six) hours as needed for muscle spasms.   montelukast 10 MG tablet Commonly known as:  SINGULAIR TAKE 1 TABLET (10 MG TOTAL) BY MOUTH AT BEDTIME. What changed:  how much to take  how to take this  when to  take this  additional instructions   oxyCODONE-acetaminophen 5-325 MG tablet Commonly known as:  ROXICET Take 1 tablet by mouth every 6 (six) hours as needed for severe pain.   potassium citrate 10 MEQ (1080 MG) SR tablet Commonly known as:  UROCIT-K Take 1 tablet (10 mEq total) by mouth daily.   sitaGLIPtin 100 MG tablet Commonly known as:  JANUVIA Take 1 tablet (100  mg total) by mouth daily.       Diagnostic Studies: Dg Knee 1-2 Views Right  Result Date: 03/14/2016 CLINICAL DATA:  Right knee arthroplasty. EXAM: RIGHT KNEE - 1-2 VIEW COMPARISON:  Right knee radiographs 05/27/2015. FINDINGS: A right total knee arthroplasty is noted. Right-sided joint effusion is present, as expected. Joint spacing is symmetric. IMPRESSION: Right total knee arthroplasty without radiographic evidence for complication. Electronically Signed   By: San Morelle M.D.   On: 03/14/2016 15:55   US Renal  Result Date: 03/17/2016 CLINICAL DATA:  Acute kidney injury EXAM: RENAL / URINARY TRACT ULTRASOUND COMPLETE COMPARISON:  None. FINDINGS: Right Kidney: Length: 10.3 cm. Small cysts in the mid and upper pole, the largest 12 mm. No hydronephrosis. Normal echotexture. Left Kidney: Length: 10.8 cm. 11 mm cyst in the midpole. Normal echotexture. No hydronephrosis. Bladder: Appears normal for degree of bladder distention. IMPRESSION: No acute findings.  No hydronephrosis. Electronically Signed   By: Rolm Baptise M.D.   On: 03/17/2016 11:35   Xr Knee 1-2 Views Left  Result Date: 02/23/2016 AP lateral left knee shows end-stage osteoarthritis bone-on-bone medial compartment marginal osteophyte subchondral sclerosis. Impression: Tricompartmental osteoarthritis left knee.  Xr Knee 1-2 Views Right  Result Date: 02/23/2016 Standing AP and lateral right knee obtained. This shows end-stage osteoarthritis tricompartmental with large osteophyte subchondral sclerosis flattening of the femoral condyle subchondral cyst  formation. Impression  : severe primary osteoarthritis right knee. No evidence of acute fracture.   Disposition: 01-Home or Self Care     Contact information for follow-up providers    Marybelle Killings, MD. Schedule an appointment as soon as possible for a visit today.   Specialty:  Orthopedic Surgery Why:  need return office visit 2 weeks postop Contact information: [place] [id number] [phone]            Contact information for after-discharge care    [facility] Follow up.   Specialty:  [facility] information: [address] [place] [phone]                     Signed: Erskine Emery 03/19/2016, 8:12 AM

## 2016-03-19 NOTE — Progress Notes (Signed)
PROGRESS NOTE        PATIENT DETAILS Name: Stacey Page Age: 66 y.o. Sex: female Date of Birth: 1950-12-09 Admit Date: 03/14/2016 Admitting Physician Marybelle Killings, MD BSW:[id number] [name], NP  Brief Narrative: Patient is a 66 y.o. female with past medical history of chronic kidney disease stage III, chronic diastolic heart failure-who was admitted on 2/5 for a right total knee arthroplasty, her hospital course was complicated by development of transient hypotension along with acute blood loss anemia. She was subsequently found to have acute on chronic kidney failure, [facility] was consulted for assistance in managing her renal failure.  Subjective: Feels much better-lying comfortably in bed. Denies any chest pain or shortness of breath. She is anxious to be discharged from the hospital.  Assessment/Plan: Acute on chronic renal failure stage III: Acute renal failure probably secondary to hemodynamic mediated injury-due to diuretic, ACE inhibitor and Toradol use, compounded by soft blood pressure and acute blood loss anemia. Creatinine much improved and now at baseline with supportive care. Should be ok to resume her usual medications-would still avoid NSAID's. She will need a repeat BMET in 1 week while at SNF.   Perioperative blood loss anemia: Significantly improved post transfusion. Continue to monitor periodically.  Chronic diastolic heart failure: Continues to be clinically compensated-ok to resume diuretic and ACE inhibitor-recheck BMET in 1 week  OSA: Continue CPAP daily at bedtime  Type 2 diabetes: CBGs stable-continue SSI and Tradjenta-resume usual oral hypoglycemic regimen on discharge.  Hypertension: Had soft blood pressures over the past few days-blood pressure remains stable-continue to hold antihypertensives today-suspect we could resume tomorrow.  Dyslipidemia: Continue statin  Status post total right knee arthroplasty:  Deferred to orthopedics  [facility] will sign off.  DVT Prophylaxis: Deferred to primary service  Code Status: Full code   Family Communication: None at bedside  Disposition Plan: Defer to primary service  Antimicrobial agents: Anti-infectives    Start     Dose/Rate Route Frequency Ordered Stop   03/14/16 1215  ceFAZolin (ANCEF) IVPB 2g/100 mL premix     2 g 200 mL/hr over 30 Minutes Intravenous On call to O.R. 03/14/16 1102 03/14/16 1250   03/14/16 1105  ceFAZolin (ANCEF) 2-4 GM/100ML-% IVPB    Comments:  Henrine Screws   : cabinet override      03/14/16 1105 03/14/16 1250      Procedures: None   Time spent: 25 minutes-Greater than 50% of this time was spent in counseling, explanation of diagnosis, planning of further management, and coordination of care.  MEDICATIONS: Scheduled Meds: . aspirin EC  325 mg Oral Q breakfast  . atorvastatin  20 mg Oral q1800  . bisacodyl  10 mg Rectal Once  . docusate sodium  100 mg Oral BID  . fluticasone  1 spray Each [name] Daily  . insulin aspart  0-20 Units Subcutaneous TID WC  . linagliptin  5 mg Oral Daily  . montelukast  10 mg Oral QHS  . potassium citrate  10 mEq Oral Daily   Continuous Infusions: . lactated ringers 10 mL/hr at 03/14/16 1115   PRN Meds:.acetaminophen **OR** acetaminophen, albuterol, ALPRAZolam, gabapentin, HYDROmorphone (DILAUDID) injection, menthol-cetylpyridinium **OR** phenol, methocarbamol **OR** methocarbamol (ROBAXIN)  IV, metoCLOPramide **OR** metoCLOPramide (REGLAN) injection, ondansetron **OR** ondansetron (ZOFRAN) IV, oxyCODONE, polyethylene glycol   PHYSICAL EXAM: Vital signs: Vitals:   03/18/16 0500 03/18/16 1409  03/18/16 2019 03/19/16 0613  BP: (!) 153/54 (!) 131/59 (!) 165/60 (!) 171/57  Pulse: 62 66 83 62  Resp: 16 16    Temp: 97.7 F (36.5 C) 99.2 F (37.3 C) 98.5 F (36.9 C) 97.8 F (36.6 C)  TempSrc: Oral Axillary Oral Oral  SpO2: 98% 100% 95% 99%  Weight:       Filed Weights    03/11/16 1149 03/14/16 1045  Weight: 113.4 kg (250 lb) 113.4 kg (250 lb)   Body mass index is 38.01 kg/m (pended).   General appearance :Awake, alert, not in any distress.  Eyes:, pupils equally reactive to light and accomodation,no scleral icterus. HEENT: Atraumatic and Normocephalic Neck: supple, no JVD. No cervical lymphadenopathy. No thyromegaly Resp:Good air entry bilaterally, no added sounds  CVS: S1 S2 regular GI: Bowel sounds present, Non tender and not distended with no gaurding, rigidity or rebound.No organomegaly Extremities: B/L Lower Ext shows no edema, both legs are warm to touch Neurology:  speech clear,Non focal, sensation is grossly intact. Psychiatric: Normal judgment and insight Musculoskeletal:No digital cyanosis Skin:No Rash, warm and dry Wounds:N/A  I have personally reviewed following labs and imaging studies  LABORATORY DATA: CBC:  Recent Labs Lab 03/15/16 0513 03/16/16 0626 03/17/16 0400 03/17/16 2149 03/18/16 0446  WBC 6.3 7.7 8.8  --  7.9  NEUTROABS  --   --   --   --  5.2  HGB 9.6* 8.3* 8.8* 10.0* 10.1*  HCT 29.6* 25.9* 26.9* 30.4* 30.5*  MCV 87.8 88.1 87.3  --  86.6  PLT 198 187 159  --  427    Basic Metabolic Panel:  Recent Labs Lab 03/15/16 0513 03/16/16 0626 03/17/16 0400 03/18/16 0446 03/19/16 0528  NA 134* 133* 132* 133* 135  K 4.7 4.2 4.2 4.4 4.2  CL 103 100* 97* 100* 101  CO2 23 23 24 23 24   GLUCOSE 124* 109* 116* 92 105*  BUN 19 30* 39* 37* 21*  CREATININE 1.34* 3.16* 3.28* 2.18* 1.27*  CALCIUM 8.9 8.8* 8.9 8.8* 8.9    GFR: Estimated Creatinine Clearance: 59.1 mL/min (by C-G formula based on SCr of 1.27 mg/dL (H)).  Liver Function Tests: No results for input(s): AST, ALT, ALKPHOS, BILITOT, PROT, ALBUMIN in the last 168 hours. No results for input(s): LIPASE, AMYLASE in the last 168 hours. No results for input(s): AMMONIA in the last 168 hours.  Coagulation Profile: No results for input(s): INR, PROTIME in the  last 168 hours.  Cardiac Enzymes: No results for input(s): CKTOTAL, CKMB, CKMBINDEX, TROPONINI in the last 168 hours.  BNP (last 3 results) No results for input(s): PROBNP in the last 8760 hours.  HbA1C: No results for input(s): HGBA1C in the last 72 hours.  CBG:  Recent Labs Lab 03/17/16 1704 03/17/16 2145 03/18/16 0635 03/18/16 1358 03/18/16 1621  GLUCAP 104* 123* 88 108* 97    Lipid Profile: No results for input(s): CHOL, HDL, LDLCALC, TRIG, CHOLHDL, LDLDIRECT in the last 72 hours.  Thyroid Function Tests: No results for input(s): TSH, T4TOTAL, FREET4, T3FREE, THYROIDAB in the last 72 hours.  Anemia Panel: No results for input(s): VITAMINB12, FOLATE, FERRITIN, TIBC, IRON, RETICCTPCT in the last 72 hours.  Urine analysis:    Component Value Date/Time   COLORURINE YELLOW 03/17/2016 1318   APPEARANCEUR CLEAR 03/17/2016 1318   LABSPEC 1.014 03/17/2016 1318   PHURINE 5.0 03/17/2016 1318   GLUCOSEU NEGATIVE 03/17/2016 1318   HGBUR SMALL (A) 03/17/2016 1318   BILIRUBINUR NEGATIVE 03/17/2016 1318  BILIRUBINUR negative 12/03/2015 0953   KETONESUR NEGATIVE 03/17/2016 1318   PROTEINUR NEGATIVE 03/17/2016 1318   UROBILINOGEN 0.2 12/03/2015 0953   UROBILINOGEN 0.2 06/06/2014 1418   NITRITE NEGATIVE 03/17/2016 1318   LEUKOCYTESUR NEGATIVE 03/17/2016 1318    Sepsis Labs: Lactic Acid, Venous No results found for: LATICACIDVEN  MICROBIOLOGY: No results found for this or any previous visit (from the past 240 hour(s)).  RADIOLOGY STUDIES/RESULTS: Dg Knee 1-2 Views Right  Result Date: 03/14/2016 CLINICAL DATA:  Right knee arthroplasty. EXAM: RIGHT KNEE - 1-2 VIEW COMPARISON:  Right knee radiographs 05/27/2015. FINDINGS: A right total knee arthroplasty is noted. Right-sided joint effusion is present, as expected. Joint spacing is symmetric. IMPRESSION: Right total knee arthroplasty without radiographic evidence for complication. Electronically Signed   By: San Morelle M.D.   On: 03/14/2016 15:55   US Renal  Result Date: 03/17/2016 CLINICAL DATA:  Acute kidney injury EXAM: RENAL / URINARY TRACT ULTRASOUND COMPLETE COMPARISON:  None. FINDINGS: Right Kidney: Length: 10.3 cm. Small cysts in the mid and upper pole, the largest 12 mm. No hydronephrosis. Normal echotexture. Left Kidney: Length: 10.8 cm. 11 mm cyst in the midpole. Normal echotexture. No hydronephrosis. Bladder: Appears normal for degree of bladder distention. IMPRESSION: No acute findings.  No hydronephrosis. Electronically Signed   By: Rolm Baptise M.D.   On: 03/17/2016 11:35   Xr Knee 1-2 Views Left  Result Date: 02/23/2016 AP lateral left knee shows end-stage osteoarthritis bone-on-bone medial compartment marginal osteophyte subchondral sclerosis. Impression: Tricompartmental osteoarthritis left knee.  Xr Knee 1-2 Views Right  Result Date: 02/23/2016 Standing AP and lateral right knee obtained. This shows end-stage osteoarthritis tricompartmental with large osteophyte subchondral sclerosis flattening of the femoral condyle subchondral cyst formation. Impression  : severe primary osteoarthritis right knee. No evidence of acute fracture.    LOS: 5 days   Oren Binet, MD  [facility] Pager:[phone]  If 7PM-7AM, please contact night-coverage [email or web address] Password [email or web address] 03/19/2016, 8:11 AM

## 2016-03-19 NOTE — Progress Notes (Signed)
Physical Therapy Treatment Patient Details Name: Stacey Page MRN: [id number] DOB: December 28, 1950 Today's Date: 03/19/2016    History of Present Illness 66 y.o. female admitted to [facility] on 03/14/16 for elective R TKA.  Pt with significant PMHx of peripheral neuropathy, HTN, DM, COPD, CHF, and R lung biopsy.      PT Comments    Pt making steady progress with PT goals.  Increased gt distance ambulating 100' with CGA.  Completed sit/stand transfer from elevated bed with CGA.  Per chart review, pt has orders to d/c to SNF today.      Follow Up Recommendations  SNF     Equipment Recommendations  None recommended by PT    Recommendations for Other Services       Precautions / Restrictions Precautions Precautions: Knee Required Braces or Orthoses: Knee Immobilizer - Right Knee Immobilizer - Right: On except when in CPM Restrictions RLE Weight Bearing: Weight bearing as tolerated    Mobility  Bed Mobility Overal bed mobility: Needs Assistance Bed Mobility: Supine to Sit;Sit to Supine     Supine to sit: Supervision Sit to supine: Min assist   General bed mobility comments: use of bedrail & HOB elevated to achieve supine to sit; min assist for RLE management into bed with sit to supine.   Transfers Overall transfer level: Needs assistance Equipment used: Rolling walker (2 wheeled) Transfers: Sit to/from Stand Sit to Stand: Min guard         General transfer comment: pt requesting height of bed elevated to achieve standing.  cues for hand placement but no physical assist needed.   Ambulation/Gait Ambulation/Gait assistance: Min guard Ambulation Distance (Feet): 100 Feet Assistive device: Rolling walker (2 wheeled) Gait Pattern/deviations: Step-to pattern;Step-through pattern;Decreased step length - right;Decreased step length - left;Decreased weight shift to right Gait velocity: decreased   General Gait Details: cues for sequencing, hand placement on RW, increased step  length LLE, and increased weight shifting to RLE   Stairs            Wheelchair Mobility    Modified Rankin (Stroke Patients Only)       Balance                                    Cognition Arousal/Alertness: Awake/alert Behavior During Therapy: WFL for tasks assessed/performed Overall Cognitive Status: Within Functional Limits for tasks assessed                      Exercises Total Joint Exercises Ankle Circles/Pumps: AROM;Both;10 reps Quad Sets: AROM;Both;10 reps Straight Leg Raises: AAROM;Strengthening;Right;10 reps    General Comments        Pertinent Vitals/Pain Pain Assessment: 0-10 Pain Score: 5  Pain Location: right knee Pain Descriptors / Indicators: Aching;Discomfort Pain Intervention(s): Limited activity within patient's tolerance;Monitored during session;Premedicated before session;Repositioned    Home Living                      Prior Function            PT Goals (current goals can now be found in the care plan section) Acute Rehab PT Goals Patient Stated Goal: to go to rehab before going home so she can be more independent.  PT Goal Formulation: With patient Time For Goal Achievement: 03/22/16 Potential to Achieve Goals: Good Progress towards PT goals: Progressing toward goals    Frequency  7X/week      PT Plan Current plan remains appropriate    Co-evaluation             End of Session Equipment Utilized During Treatment: Right knee immobilizer Activity Tolerance: Patient tolerated treatment well Patient left: in bed;with call bell/phone within reach     Time: 0932-1000 PT Time Calculation (min) (ACUTE ONLY): 28 min  Charges:  $Gait Training: 8-22 mins $Therapeutic Activity: 8-22 mins                    G Codes:      Sena Hitch 03/19/2016, 10:07 AM   Sarajane Marek, PTA [phone] 03/19/2016

## 2016-03-19 NOTE — Clinical Social Work Placement (Signed)
   CLINICAL SOCIAL WORK PLACEMENT  NOTE  Date:  03/19/2016  Patient Details  Name: Stepheny Canal MRN: [id number] Date of Birth: 09-22-1950  Clinical Social Work is seeking post-discharge placement for this patient at the [place] level of care (*CSW will initial, date and re-position this form in  chart as items are completed):  Yes   Patient/family provided with [place] Work Department's list of facilities offering this level of care within the geographic area requested by the patient (or if unable, by the patient's family).  Yes   Patient/family informed of their freedom to choose among providers that offer the needed level of care, that participate in Medicare, Medicaid or managed care program needed by the patient, have an available bed and are willing to accept the patient.  Yes   Patient/family informed of [place]'s ownership interest in [facility] and [facility], as well as of the fact that they are under no obligation to receive care at these facilities.  PASRR submitted to EDS on       PASRR number received on 03/15/16     Existing PASRR number confirmed on       FL2 transmitted to all facilities in geographic area requested by pt/family on 03/15/16     FL2 transmitted to all facilities within larger geographic area on       Patient informed that his/her managed care company has contracts with or will negotiate with certain facilities, including the following:        Yes   Patient/family informed of bed offers received.  Patient chooses bed at [facility]     Physician recommends and patient chooses bed at      Patient to be transferred to [facility] on 03/19/16.  Patient to be transferred to facility by Ambulance     Patient family notified on 03/19/16 of transfer.  Name of family member notified:  Patient to notify family     PHYSICIAN Please prepare priority discharge summary, including  medications, Please prepare prescriptions, Please sign FL2     Additional Comment:   Barbette Or, [place]

## 2016-03-19 NOTE — Clinical Social Work Note (Signed)
Clinical Social Worker facilitated patient discharge including contacting patient family and facility to confirm patient discharge plans.  Clinical information faxed to facility and family agreeable with plan.  CSW arranged ambulance transport via [facility] to [facility].  RN to call report prior to discharge.  Clinical Social Worker will sign off for now as social work intervention is no longer needed. Please consult us again if new need arises.  Jesse Michaelyn Wall, LCSW [phone] 

## 2016-03-19 NOTE — Progress Notes (Signed)
Subjective: 5 Days Post-Op Procedure(s) (LRB): RIGHT TOTAL KNEE ARTHROPLASTY (Right) Patient reports pain as moderate.   States she feels good today wants to discharge to skilled facility today .  Objective: Vital signs in last 24 hours: Temp:  [97.8 F (36.6 C)-99.2 F (37.3 C)] 97.8 F (36.6 C) (02/10 1610) Pulse Rate:  [62-83] 62 (02/10 0613) Resp:  [16] 16 (02/09 1409) BP: (131-171)/(57-60) 171/57 (02/10 0613) SpO2:  [95 %-100 %] 99 % (02/10 0613)  Intake/Output from previous day: 02/09 0701 - 02/10 0700 In: 240 [P.O.:240] Out: 700 [Urine:700] Intake/Output this shift: No intake/output data recorded.   Recent Labs  03/17/16 0400 03/17/16 2149 03/18/16 0446  HGB 8.8* 10.0* 10.1*    Recent Labs  03/17/16 0400 03/17/16 2149 03/18/16 0446  WBC 8.8  --  7.9  RBC 3.08*  --  3.52*  HCT 26.9* 30.4* 30.5*  PLT 159  --  188    Recent Labs  03/18/16 0446 03/19/16 0528  NA 133* 135  K 4.4 4.2  CL 100* 101  CO2 23 24  BUN 37* 21*  CREATININE 2.18* 1.27*  GLUCOSE 92 105*  CALCIUM 8.8* 8.9   No results for input(s): LABPT, INR in the last 72 hours.  Right lower leg: Sensation intact distally Intact pulses distally Dorsiflexion/Plantar flexion intact Incision: dressing C/D/I Compartment soft  Assessment/Plan: 5 Days Post-Op Procedure(s) (LRB): RIGHT TOTAL KNEE ARTHROPLASTY (Right)  BUN/Creatine trending down HgB stabilized  Up with therapy Discharge to SNF today if ok with medicine   GILBERT CLARK 03/19/2016, 8:07 AM

## 2016-03-20 DIAGNOSIS — Z96651 Presence of right artificial knee joint: Secondary | ICD-10-CM | POA: Diagnosis not present

## 2016-03-20 DIAGNOSIS — Z4789 Encounter for other orthopedic aftercare: Secondary | ICD-10-CM | POA: Diagnosis not present

## 2016-03-20 DIAGNOSIS — M1991 Primary osteoarthritis, unspecified site: Secondary | ICD-10-CM | POA: Diagnosis not present

## 2016-03-20 LAB — GLUCOSE, CAPILLARY: Glucose-Capillary: 109 mg/dL — ABNORMAL HIGH (ref 65–99)

## 2016-03-28 DIAGNOSIS — R6883 Chills (without fever): Secondary | ICD-10-CM | POA: Diagnosis not present

## 2016-03-28 DIAGNOSIS — R5383 Other fatigue: Secondary | ICD-10-CM | POA: Diagnosis not present

## 2016-03-28 DIAGNOSIS — D649 Anemia, unspecified: Secondary | ICD-10-CM | POA: Diagnosis not present

## 2016-03-28 DIAGNOSIS — I10 Essential (primary) hypertension: Secondary | ICD-10-CM | POA: Diagnosis not present

## 2016-03-29 ENCOUNTER — Ambulatory Visit (INDEPENDENT_AMBULATORY_CARE_PROVIDER_SITE_OTHER): Payer: PPO | Admitting: Orthopaedic Surgery

## 2016-03-29 ENCOUNTER — Ambulatory Visit (INDEPENDENT_AMBULATORY_CARE_PROVIDER_SITE_OTHER): Payer: PPO

## 2016-03-29 ENCOUNTER — Encounter (INDEPENDENT_AMBULATORY_CARE_PROVIDER_SITE_OTHER): Payer: Self-pay | Admitting: Orthopaedic Surgery

## 2016-03-29 VITALS — BP 105/59 | HR 66 | Ht 68.0 in | Wt 270.0 lb

## 2016-03-29 DIAGNOSIS — M1711 Unilateral primary osteoarthritis, right knee: Secondary | ICD-10-CM

## 2016-03-29 NOTE — Progress Notes (Signed)
Post-Op Visit Note   Patient: Stacey Page           Date of Birth: [birth year]           MRN: [id number] Visit Date: 03/29/2016 PCP: Wilfred Lacy, NP   Assessment & Plan:  Chief Complaint:  Chief Complaint  Patient presents with  . Right Knee - Routine Post Op   Visit Diagnoses:  1. Unilateral primary osteoarthritis, right knee     Plan: Total knee arthroplasty right knee doing well. She is a 0 90 we'll set up for outpatient therapy and I will recheck her in 5-6 weeks.  Follow-Up Instructions: Return in about 5 weeks (around 05/03/2016).   Orders:  Orders Placed This Encounter  Procedures  . XR Knee 1-2 Views Right   No orders of the defined types were placed in this encounter.  HPI Patient returns for first post op visit. She is status post right total knee arthroplasty on 03/14/2016. She is 2 weeks 1 day out. Steris removed, incision looks good, and steris are reapplied. She is doing well. Continued Oxycodone for pain. She is staying at [facility]. She is set to go home on Saturday.    Imaging: Xr Knee 1-2 Views Right  Result Date: 03/29/2016 Standing AP x-rays both these and lateral right knee shows good position total knee arthroplasty. Left knee osteoarthritis with bone-on-bone medial compartment. Impression: Satisfactory postop right total knee arthroplasty   PMFS History: Patient Active Problem List   Diagnosis Date Noted  . Acute kidney injury ([place]) 03/17/2016  . CKD (chronic kidney disease) stage 3, GFR 30-59 ml/min 03/17/2016  . Symptomatic anemia 03/17/2016  . S/P total knee arthroplasty, right 03/17/2016  . Acute blood loss anemia   . Arthritis of right knee 03/14/2016  . Unilateral primary osteoarthritis, right knee   . Hyperlipidemia LDL goal <70 12/15/2015  . Paresthesia of both feet 11/19/2015  . Pain in both lower extremities 11/19/2015  . Numbness in both hands 08/13/2015  . Panic attack 06/09/2015  . Right knee  pain 05/14/2015  . Chronic renal insufficiency 09/16/2014  . Pneumothorax on right   . Atypical mycobacterium infection   . ILD (interstitial lung disease) 2/2 MAI s/p med rxn   . Routine general medical examination at a health care facility 04/03/2014  . Morbid obesity ([place]) 04/03/2014  . OSA (obstructive sleep apnea) 02/25/2014  . Chronic diastolic heart failure ([place]) 02/25/2014  . Controlled steroid-induced diabetes mellitus ([place]) 02/25/2014  . Essential hypertension 02/25/2014   Past Medical History:  Diagnosis Date  . Anxiety    doesn't take any meds  . Asthma   . CHF (congestive heart failure) (HCC)    takes Furosemide daily  . COPD (chronic obstructive pulmonary disease) (HCC)    Albuterol as needed  . Depression    doesn't take meds  . Diabetes ([place])    takes Januvia daily  . Eczema    uses cream as needed  . GERD (gastroesophageal reflux disease)   . History of bronchitis as a child   . HTN (hypertension)    takes Lisinopril and Labetalol daily  . Insomnia   . Joint pain   . Joint swelling   . OA (osteoarthritis)   . OSA (obstructive sleep apnea) 02/25/2014   wears CPAP at night  . Peripheral neuropathy (HCC)    takes Gabapentin as needed  . Pneumonia    hx of-2010  . Seasonal allergies    uses Flonase  daily    Family History  Problem Relation Age of Onset  . COPD Father   . Hypothyroidism Father   . Anemia Father     iron deficiency  . Cancer Mother 68    pancreatic    Past Surgical History:  Procedure Laterality Date  . CESAREAN SECTION  x2  . COLONOSCOPY    . JOINT REPLACEMENT    . KNEE ARTHROSCOPY Right   . LUNG BIOPSY Right 06/10/2014   Procedure: LUNG BIOPSY;  Surgeon: Ivin Poot, MD;  Location: [facility];  Service: Thoracic;  Laterality: Right;  . POLYPECTOMY     throat  . TOTAL KNEE ARTHROPLASTY Right 03/14/2016   Procedure: RIGHT TOTAL KNEE ARTHROPLASTY;  Surgeon: Marybelle Killings, MD;  Location: [facility];  Service: Orthopedics;  Laterality:  Right;  Marland Kitchen VIDEO ASSISTED THORACOSCOPY Right 06/10/2014   Procedure: VIDEO ASSISTED THORACOSCOPY;  Surgeon: Ivin Poot, MD;  Location: [facility];  Service: Thoracic;  Laterality: Right;   Social History   Occupational History  . disabled    Social History Main Topics  . Smoking status: Former Smoker    Packs/day: 0.25    Years: 15.00    Types: Cigarettes    Quit date: 02/08/1980  . Smokeless tobacco: Former Systems developer     Comment: 2 packs per week  . Alcohol use 0.0 oz/week     Comment: occassional/social/rare  . Drug use: No  . Sexual activity: No

## 2016-04-01 DIAGNOSIS — I5032 Chronic diastolic (congestive) heart failure: Secondary | ICD-10-CM | POA: Diagnosis not present

## 2016-04-01 DIAGNOSIS — I10 Essential (primary) hypertension: Secondary | ICD-10-CM | POA: Diagnosis not present

## 2016-04-01 DIAGNOSIS — Z4789 Encounter for other orthopedic aftercare: Secondary | ICD-10-CM | POA: Diagnosis not present

## 2016-04-01 DIAGNOSIS — E785 Hyperlipidemia, unspecified: Secondary | ICD-10-CM | POA: Diagnosis not present

## 2016-04-04 ENCOUNTER — Ambulatory Visit: Payer: PPO | Attending: Orthopaedic Surgery

## 2016-04-04 DIAGNOSIS — R600 Localized edema: Secondary | ICD-10-CM

## 2016-04-04 DIAGNOSIS — G478 Other sleep disorders: Secondary | ICD-10-CM

## 2016-04-04 DIAGNOSIS — M6281 Muscle weakness (generalized): Secondary | ICD-10-CM | POA: Insufficient documentation

## 2016-04-04 DIAGNOSIS — Z96651 Presence of right artificial knee joint: Secondary | ICD-10-CM | POA: Diagnosis not present

## 2016-04-04 DIAGNOSIS — M25661 Stiffness of right knee, not elsewhere classified: Secondary | ICD-10-CM | POA: Diagnosis not present

## 2016-04-04 NOTE — Therapy (Signed)
[place], [place], [address] Phone: [phone]   Fax:  [phone]  Physical Therapy Evaluation  Patient Details  Name: Noelani Harbach MRN: [id number] Date of Birth: 1950/07/04 Referring Provider: Rodell Perna, MD  Encounter Date: 04/04/2016      PT End of Session - 04/04/16 1142    Visit Number 1   Number of Visits 24   Date for PT Re-Evaluation 06/10/16   Authorization Type Medicare Advantage   Authorization Time Period KX at visit 15   PT Start Time 1100   PT Stop Time 1200   PT Time Calculation (min) 60 min   Activity Tolerance Patient tolerated treatment well;No increased pain   Behavior During Therapy WFL for tasks assessed/performed      Past Medical History:  Diagnosis Date  . Anxiety    doesn't take any meds  . Asthma   . CHF (congestive heart failure) (HCC)    takes Furosemide daily  . COPD (chronic obstructive pulmonary disease) (HCC)    Albuterol as needed  . Depression    doesn't take meds  . Diabetes ([place])    takes Januvia daily  . Eczema    uses cream as needed  . GERD (gastroesophageal reflux disease)   . History of bronchitis as a child   . HTN (hypertension)    takes Lisinopril and Labetalol daily  . Insomnia   . Joint pain   . Joint swelling   . OA (osteoarthritis)   . OSA (obstructive sleep apnea) 02/25/2014   wears CPAP at night  . Peripheral neuropathy (HCC)    takes Gabapentin as needed  . Pneumonia    hx of-2010  . Seasonal allergies    uses Flonase daily    Past Surgical History:  Procedure Laterality Date  . CESAREAN SECTION  x2  . COLONOSCOPY    . JOINT REPLACEMENT    . KNEE ARTHROSCOPY Right   . LUNG BIOPSY Right 06/10/2014   Procedure: LUNG BIOPSY;  Surgeon: Ivin Poot, MD;  Location: [facility];  Service: Thoracic;  Laterality: Right;  . POLYPECTOMY     throat  . TOTAL KNEE ARTHROPLASTY Right 03/14/2016   Procedure: RIGHT TOTAL KNEE  ARTHROPLASTY;  Surgeon: Marybelle Killings, MD;  Location: [facility];  Service: Orthopedics;  Laterality: Right;  Marland Kitchen VIDEO ASSISTED THORACOSCOPY Right 06/10/2014   Procedure: VIDEO ASSISTED THORACOSCOPY;  Surgeon: Ivin Poot, MD;  Location: [facility];  Service: Thoracic;  Laterality: Right;    There were no vitals filed for this visit.       Subjective Assessment - 04/04/16 1107    Subjective She reports post TKR right.  She reports OA .  She was in hosp 5 days, She  to assisted living for rehab for 2 weeks and and returned home  and has come to outpatient.    She walks with walker at home but did not bring today so used cane.  She is driving.   She has done her HEP.      Limitations Standing;House hold activities;Walking   How long can you sit comfortably? As needed   How long can you stand comfortably? 3-4 min   How long can you walk comfortably? 4-5 min   Patient Stated Goals She want to walk without cane/device attend events and walk as needed   Currently in Pain? Yes   Pain Score 7    Pain Location Knee   Pain Orientation Right;Lateral  Pain Descriptors / Indicators Aching   Pain Type Surgical pain   Pain Radiating Towards lateral lower leg to ankle   Pain Onset 1 to 4 weeks ago   Pain Frequency Constant   Aggravating Factors  activity   Pain Relieving Factors rest , gentle activity,  meds for sleep   Multiple Pain Sites No            [facility] PT Assessment - 04/04/16 0001      Assessment   Medical Diagnosis RT TKA    Referring Provider Rodell Perna, MD   Onset Date/Surgical Date 03/14/16   Next MD Visit 05/03/16   Prior Therapy Assisted living     Precautions   Precautions Fall     Restrictions   Weight Bearing Restrictions No     Balance Screen   Has the patient fallen in the past 6 months No   Has the patient had a decrease in activity level because of a fear of falling?  Yes   Is the patient reluctant to leave their home because of a fear of falling?  No     Home  Environment   Living Environment Private residence   Type of [place] Access Stairs to enter  2 inch height   Entrance Stairs-Number of Steps 2   Home Layout One level     Prior Function   Level of Independence Needs assistance with ADLs;Needs assistance with homemaking;Requires assistive device for independence     Cognition   Overall Cognitive Status Within Functional Limits for tasks assessed     Observation/Other Assessments-Edema    Edema Circumferential     Circumferential Edema   Circumferential - Right 52 cm   Circumferential - Left  48 cm     ROM / Strength   AROM / PROM / Strength AROM;PROM;Strength     AROM   AROM Assessment Site Knee   Right/Left Knee Right;Left   Right Knee Extension -23   Right Knee Flexion 80   Left Knee Extension -5   Left Knee Flexion 110     PROM   PROM Assessment Site Knee   Right/Left Knee Right   Right Knee Extension -15   Right Knee Flexion 85     Strength   Overall Strength Comments RT hip abduction and extension 2+/5   Strength Assessment Site Knee   Right/Left Knee Left;Right   Right Knee Flexion 5/5   Right Knee Extension 3/5   Left Knee Flexion 5/5   Left Knee Extension 5/5     Flexibility   Soft Tissue Assessment /Muscle Length yes   Hamstrings 70 degrees bilaterally.     Palpation   Patella mobility decreased all directions   Palpation comment tightness in  soft tissue and scar and swelling      Ambulation/Gait   Assistive device Straight cane   Ambulation Surface Level;Indoor   Gait velocity 2 ft/sec   Gait Comments Walking with SPC dec weight to RT                    [facility] Adult PT Treatment/Exercise - 04/04/16 0001      Modalities   Modalities Vasopneumatic     Vasopneumatic   Number Minutes Vasopneumatic  15 minutes   Vasopnuematic Location  Knee   Vasopneumatic Pressure Low   Vasopneumatic Temperature  32                PT Education - 04/04/16  1142    Education  provided Yes   Education Details POC   Person(s) Educated Patient   Methods Explanation   Comprehension Verbalized understanding          PT Short Term Goals - 04/04/16 1157      PT SHORT TERM GOAL #1   Title she will be independent with inital HEP   Time 3   Period Weeks   Status New     PT SHORT TERM GOAL #2   Title She will improve active RT knee flexion to 105 degrees   Time 4   Period Weeks   Status New     PT SHORT TERM GOAL #3   Title She will walk full time with SPC in and out of home   Time 4   Period Weeks   Status New     PT SHORT TERM GOAL #4   Title She will report pain decreased 30% or more generally during the day   Time 4   Period Weeks   Status New     PT SHORT TERM GOAL #5   Title She will have active knee extension to -12 degrees to asssit with ambualtion   Time 4   Period Weeks   Status New           PT Long Term Goals - 04/04/16 1200      PT LONG TERM GOAL #1   Title She will be independent with all HEP issued   Time 12   Period Weeks   Status New     PT LONG TERM GOAL #2   Title She will walk with least resicted assistive device out of home nad no device in home   Time 12   Period Weeks   Status New     PT LONG TERM GOAL #3   Title She will have -5 degrees extension and 115 degrees of flexion to asssit with safe independnet ambulation   Time 12   Period Weeks   Status New     PT LONG TERM GOAL #4   Title She will be able to walk into store and do normal shopping withoug sitting due to pain   Time 12   Period Weeks   Status New     PT LONG TERM GOAL #5   Title She will be able to walk stairs step over step with one rail safely   Time 12   Period Weeks   Status New     PT LONG TERM GOAL #6   Title She will report intermittant pain   Time 12   Period Weeks   Status New     PT LONG TERM GOAL #7   Title She will report return to normal home tasks with 1-2 max pain   Time 12   Period Weeks   Status New     PT  LONG TERM GOAL #8   Title She will improve hip strength to 4/5 to improve safe independence with walking   Time 12   Period Weeks   Status New               Plan - 04/04/16 1144    Clinical Impression Statement MS Lebron presents for moderate level eval post TKA with weakness , decreased knee ROM , swelling and difficulty walking and tolerance to weight bearing limiting mobility and performance of normal home and community activity   Rehab Potential Good   PT Frequency 2x /  week   PT Duration 12 weeks   PT Treatment/Interventions Cryotherapy;Electrical Stimulation;Stair training;Gait training;Passive range of motion;Patient/family education;Taping;Manual techniques;Therapeutic exercise;Therapeutic activities   PT Next Visit Plan ROM ands strengthening, , nustep, vaso if pt ok's, manual for ROM and swelling      Patient will benefit from skilled therapeutic intervention in order to improve the following deficits and impairments:  Decreased range of motion, Difficulty walking, Obesity, Pain, Decreased strength, Increased edema, Decreased activity tolerance  Visit Diagnosis: Status post total right knee replacement - Plan: PT plan of care cert/re-cert  Stiffness of right knee, not elsewhere classified - Plan: PT plan of care cert/re-cert  Difficulty waking - Plan: PT plan of care cert/re-cert  Muscle weakness (generalized) - Plan: PT plan of care cert/re-cert  Localized edema - Plan: PT plan of care cert/re-cert      G-Codes - 33/29/51 1205    Functional Assessment Tool Used (Outpatient Only) clinical judgement   Functional Limitation Mobility: Walking and moving around   Mobility: Walking and Moving Around Current Status ([id number]) At least 60 percent but less than 80 percent impaired, limited or restricted   Mobility: Walking and Moving Around Goal Status [id number]) At least 20 percent but less than 40 percent impaired, limited or restricted       Problem List Patient  Active Problem List   Diagnosis Date Noted  . Acute kidney injury ([place]) 03/17/2016  . CKD (chronic kidney disease) stage 3, GFR 30-59 ml/min 03/17/2016  . Symptomatic anemia 03/17/2016  . S/P total knee arthroplasty, right 03/17/2016  . Acute blood loss anemia   . Arthritis of right knee 03/14/2016  . Unilateral primary osteoarthritis, right knee   . Hyperlipidemia LDL goal <70 12/15/2015  . Paresthesia of both feet 11/19/2015  . Pain in both lower extremities 11/19/2015  . Numbness in both hands 08/13/2015  . Panic attack 06/09/2015  . Right knee pain 05/14/2015  . Chronic renal insufficiency 09/16/2014  . Pneumothorax on right   . Atypical mycobacterium infection   . ILD (interstitial lung disease) 2/2 MAI s/p med rxn   . Routine general medical examination at a health care facility 04/03/2014  . Morbid obesity ([place]) 04/03/2014  . OSA (obstructive sleep apnea) 02/25/2014  . Chronic diastolic heart failure ([place]) 02/25/2014  . Controlled steroid-induced diabetes mellitus ([place]) 02/25/2014  . Essential hypertension 02/25/2014    Darrel Hoover 04/04/2016, 12:08 PM  [facility] [address] [place], [place], [address] Phone: [phone]   Fax:  [phone]  Name: Ron Junco MRN: [id number] Date of Birth: 09/10/1950

## 2016-04-05 ENCOUNTER — Ambulatory Visit (INDEPENDENT_AMBULATORY_CARE_PROVIDER_SITE_OTHER): Payer: PPO | Admitting: Orthopaedic Surgery

## 2016-04-05 ENCOUNTER — Telehealth (INDEPENDENT_AMBULATORY_CARE_PROVIDER_SITE_OTHER): Payer: Self-pay | Admitting: Orthopaedic Surgery

## 2016-04-05 ENCOUNTER — Other Ambulatory Visit (INDEPENDENT_AMBULATORY_CARE_PROVIDER_SITE_OTHER): Payer: Self-pay | Admitting: *Deleted

## 2016-04-05 ENCOUNTER — Encounter (INDEPENDENT_AMBULATORY_CARE_PROVIDER_SITE_OTHER): Payer: Self-pay | Admitting: Orthopaedic Surgery

## 2016-04-05 VITALS — Ht 68.0 in | Wt 270.0 lb

## 2016-04-05 DIAGNOSIS — M79661 Pain in right lower leg: Secondary | ICD-10-CM

## 2016-04-05 DIAGNOSIS — Z96651 Presence of right artificial knee joint: Secondary | ICD-10-CM

## 2016-04-05 MED ORDER — METHOCARBAMOL 500 MG PO TABS: 500.0000 mg | ORAL_TABLET | Freq: Three times a day (TID) | ORAL | 0 refills | Status: DC | PRN

## 2016-04-05 MED ORDER — OXYCODONE-ACETAMINOPHEN 5-325 MG PO TABS: 1.0000 | ORAL_TABLET | Freq: Three times a day (TID) | ORAL | 0 refills | Status: DC | PRN

## 2016-04-05 NOTE — Progress Notes (Signed)
Post-Op Visit Note   Patient: Stacey Page           Date of Birth: Sep 29, 1950           MRN: [id number] Visit Date: 04/05/2016 PCP: Wilfred Lacy, NP   Assessment & Plan:  Chief Complaint:  Chief Complaint  Patient presents with  . Right Knee - Pain, Follow-up   Visit Diagnoses:  1. S/P total knee arthroplasty, right   2. Right calf pain     Plan: With patient's current complaint that she presents with we recommend getting a venous Doppler right lower extremity to rule out DVT. She is not currently taking aspirin. States that she was not told to take this when she was discharged from the rehabilitation center. Patient states she also needs refill of Percocet and Robaxin and these were given.  Follow-Up Instructions: Return in about 3 weeks (around 04/26/2016).   Orders:  No orders of the defined types were placed in this encounter.  No orders of the defined types were placed in this encounter.  HPI Patient comes in today with complaint of right knee pain, swelling, and warmth. She is status post right total knee arthroplasty on 03/14/2016. She is at home now. She went to outpatient physical therapy yesterday and states the therapist said her leg was warm to the touch and they could not do much due to swelling. She has pain going down her anterior tibia and it is sensitive to touch. She ambulates with a cane. She requests Robaxin as the rehab facility did not send her home with any. She also only has 8 Oxycodone left. No completes of chest pain shortness of breath.  Imaging: No results found.   Exam: Pleasant female alert and oriented and in no acute distress. Gait is somewhat antalgic. Range of motion right knee about 0-90. Surgical incision is healing well. No signs of infection. Does have some knee swelling as be expected. Swelling also extends into the right calf. Moderate to marked proximal to mid calf tenderness on the right. Nontender on the left side. PMFS  History: Patient Active Problem List   Diagnosis Date Noted  . Acute kidney injury ([place]) 03/17/2016  . CKD (chronic kidney disease) stage 3, GFR 30-59 ml/min 03/17/2016  . Symptomatic anemia 03/17/2016  . S/P total knee arthroplasty, right 03/17/2016  . Acute blood loss anemia   . Arthritis of right knee 03/14/2016  . Unilateral primary osteoarthritis, right knee   . Hyperlipidemia LDL goal <70 12/15/2015  . Paresthesia of both feet 11/19/2015  . Pain in both lower extremities 11/19/2015  . Numbness in both hands 08/13/2015  . Panic attack 06/09/2015  . Right knee pain 05/14/2015  . Chronic renal insufficiency 09/16/2014  . Pneumothorax on right   . Atypical mycobacterium infection   . ILD (interstitial lung disease) 2/2 MAI s/p med rxn   . Routine general medical examination at a health care facility 04/03/2014  . Morbid obesity ([place]) 04/03/2014  . OSA (obstructive sleep apnea) 02/25/2014  . Chronic diastolic heart failure ([place]) 02/25/2014  . Controlled steroid-induced diabetes mellitus ([place]) 02/25/2014  . Essential hypertension 02/25/2014   Past Medical History:  Diagnosis Date  . Anxiety    doesn't take any meds  . Asthma   . CHF (congestive heart failure) (HCC)    takes Furosemide daily  . COPD (chronic obstructive pulmonary disease) (HCC)    Albuterol as needed  . Depression    doesn't take meds  . Diabetes ([place])  takes Januvia daily  . Eczema    uses cream as needed  . GERD (gastroesophageal reflux disease)   . History of bronchitis as a child   . HTN (hypertension)    takes Lisinopril and Labetalol daily  . Insomnia   . Joint pain   . Joint swelling   . OA (osteoarthritis)   . OSA (obstructive sleep apnea) 02/25/2014   wears CPAP at night  . Peripheral neuropathy (HCC)    takes Gabapentin as needed  . Pneumonia    hx of-2010  . Seasonal allergies    uses Flonase daily    Family History  Problem Relation Age of Onset  . COPD Father   .  Hypothyroidism Father   . Anemia Father     iron deficiency  . Cancer Mother 64    pancreatic    Past Surgical History:  Procedure Laterality Date  . CESAREAN SECTION  x2  . COLONOSCOPY    . JOINT REPLACEMENT    . KNEE ARTHROSCOPY Right   . LUNG BIOPSY Right 06/10/2014   Procedure: LUNG BIOPSY;  Surgeon: Ivin Poot, MD;  Location: [facility];  Service: Thoracic;  Laterality: Right;  . POLYPECTOMY     throat  . TOTAL KNEE ARTHROPLASTY Right 03/14/2016   Procedure: RIGHT TOTAL KNEE ARTHROPLASTY;  Surgeon: Marybelle Killings, MD;  Location: [facility];  Service: Orthopedics;  Laterality: Right;  Marland Kitchen VIDEO ASSISTED THORACOSCOPY Right 06/10/2014   Procedure: VIDEO ASSISTED THORACOSCOPY;  Surgeon: Ivin Poot, MD;  Location: [facility];  Service: Thoracic;  Laterality: Right;   Social History   Occupational History  . disabled    Social History Main Topics  . Smoking status: Former Smoker    Packs/day: 0.25    Years: 15.00    Types: Cigarettes    Quit date: 02/08/1980  . Smokeless tobacco: Former Systems developer     Comment: 2 packs per week  . Alcohol use 0.0 oz/week     Comment: occassional/social/rare  . Drug use: No  . Sexual activity: No

## 2016-04-05 NOTE — Telephone Encounter (Signed)
Patient called asked if she can get a Rx for Robaxin. Patient said her right leg is swollen and hurts when she touch it. Patient asked for a call back as soon as possible. Patient said the leg is warm to touch.   Patient said it is hard to do (PT) The number to contact patient is [phone]

## 2016-04-05 NOTE — Telephone Encounter (Signed)
IC patient and made an appt for this afternoon to check leg.

## 2016-04-06 ENCOUNTER — Ambulatory Visit: Payer: PPO | Admitting: Physical Therapy

## 2016-04-06 ENCOUNTER — Ambulatory Visit (HOSPITAL_COMMUNITY)
Admission: RE | Admit: 2016-04-06 | Discharge: 2016-04-06 | Disposition: A | Discharge status: Home / Self Care | Payer: PPO | Source: Ambulatory Visit | Attending: Surgery | Admitting: Surgery

## 2016-04-06 DIAGNOSIS — Z96651 Presence of right artificial knee joint: Secondary | ICD-10-CM

## 2016-04-06 DIAGNOSIS — M25661 Stiffness of right knee, not elsewhere classified: Secondary | ICD-10-CM

## 2016-04-06 DIAGNOSIS — G478 Other sleep disorders: Secondary | ICD-10-CM

## 2016-04-06 DIAGNOSIS — M79661 Pain in right lower leg: Secondary | ICD-10-CM

## 2016-04-06 DIAGNOSIS — R600 Localized edema: Secondary | ICD-10-CM

## 2016-04-06 DIAGNOSIS — M6281 Muscle weakness (generalized): Secondary | ICD-10-CM

## 2016-04-06 NOTE — Progress Notes (Signed)
*  Preliminary Results* Right lower extremity venous duplex completed. Right lower extremity is negative for deep vein thrombosis. There is no evidence of right Baker's cyst. Attempted to leave preliminary results at [phone], however there was no answer. The patient will be discharged and can be reached by phone, if necessary.  04/06/2016 11:07 AM  Maudry Mayhew, BS, RVT, RDCS, RDMS

## 2016-04-06 NOTE — Therapy (Signed)
[place], [place], [address] Phone: [phone]   Fax:  [phone]  Physical Therapy Treatment  Patient Details  Name: Stacey Page MRN: [id number] Date of Birth: 01/04/1951 Referring Provider: Rodell Perna, MD  Encounter Date: 04/06/2016      PT End of Session - 04/06/16 0851    Visit Number 2   Number of Visits 24   Date for PT Re-Evaluation 06/10/16   Authorization Type Medicare Advantage   Authorization Time Period KX at visit 15   PT Start Time 0849   PT Stop Time 0935   PT Time Calculation (min) 46 min      Past Medical History:  Diagnosis Date  . Anxiety    doesn't take any meds  . Asthma   . CHF (congestive heart failure) (HCC)    takes Furosemide daily  . COPD (chronic obstructive pulmonary disease) (HCC)    Albuterol as needed  . Depression    doesn't take meds  . Diabetes ([place])    takes Januvia daily  . Eczema    uses cream as needed  . GERD (gastroesophageal reflux disease)   . History of bronchitis as a child   . HTN (hypertension)    takes Lisinopril and Labetalol daily  . Insomnia   . Joint pain   . Joint swelling   . OA (osteoarthritis)   . OSA (obstructive sleep apnea) 02/25/2014   wears CPAP at night  . Peripheral neuropathy (HCC)    takes Gabapentin as needed  . Pneumonia    hx of-2010  . Seasonal allergies    uses Flonase daily    Past Surgical History:  Procedure Laterality Date  . CESAREAN SECTION  x2  . COLONOSCOPY    . JOINT REPLACEMENT    . KNEE ARTHROSCOPY Right   . LUNG BIOPSY Right 06/10/2014   Procedure: LUNG BIOPSY;  Surgeon: Ivin Poot, MD;  Location: [facility];  Service: Thoracic;  Laterality: Right;  . POLYPECTOMY     throat  . TOTAL KNEE ARTHROPLASTY Right 03/14/2016   Procedure: RIGHT TOTAL KNEE ARTHROPLASTY;  Surgeon: Marybelle Killings, MD;  Location: [facility];  Service: Orthopedics;  Laterality: Right;  Marland Kitchen VIDEO ASSISTED THORACOSCOPY Right  06/10/2014   Procedure: VIDEO ASSISTED THORACOSCOPY;  Surgeon: Ivin Poot, MD;  Location: [facility];  Service: Thoracic;  Laterality: Right;    There were no vitals filed for this visit.      Subjective Assessment - 04/06/16 0853    Subjective Went to MD yesterday. he saisd to got to get checked for blood clot after PT today.    Currently in Pain? Yes   Pain Score 8    Pain Location Knee                         [facility] Adult PT Treatment/Exercise - 04/06/16 0001      Knee/Hip Exercises: Stretches   Knee: Self-Stretch to increase Flexion 2 reps;10 seconds   Knee: Self-Stretch Limitations edge of mat, heel slide and hold/ scoot      Knee/Hip Exercises: Aerobic   Nustep L1 UE/LE x 5 minutes      Knee/Hip Exercises: Seated   Long Arc Quad 10 reps     Knee/Hip Exercises: Supine   Quad Sets 10 reps   Quad Sets Limitations towel roll under knee    Short Arc Quad Sets 20 reps   Heel Slides 10  reps   Straight Leg Raises 10 reps     Vasopneumatic   Number Minutes Vasopneumatic  15 minutes   Vasopnuematic Location  Knee   Vasopneumatic Pressure Low   Vasopneumatic Temperature  32                  PT Short Term Goals - 04/04/16 1157      PT SHORT TERM GOAL #1   Title she will be independent with inital HEP   Time 3   Period Weeks   Status New     PT SHORT TERM GOAL #2   Title She will improve active RT knee flexion to 105 degrees   Time 4   Period Weeks   Status New     PT SHORT TERM GOAL #3   Title She will walk full time with SPC in and out of home   Time 4   Period Weeks   Status New     PT SHORT TERM GOAL #4   Title She will report pain decreased 30% or more generally during the day   Time 4   Period Weeks   Status New     PT SHORT TERM GOAL #5   Title She will have active knee extension to -12 degrees to asssit with ambualtion   Time 4   Period Weeks   Status New           PT Long Term Goals - 04/04/16 1200      PT LONG  TERM GOAL #1   Title She will be independent with all HEP issued   Time 12   Period Weeks   Status New     PT LONG TERM GOAL #2   Title She will walk with least resicted assistive device out of home nad no device in home   Time 12   Period Weeks   Status New     PT LONG TERM GOAL #3   Title She will have -5 degrees extension and 115 degrees of flexion to asssit with safe independnet ambulation   Time 12   Period Weeks   Status New     PT LONG TERM GOAL #4   Title She will be able to walk into store and do normal shopping withoug sitting due to pain   Time 12   Period Weeks   Status New     PT LONG TERM GOAL #5   Title She will be able to walk stairs step over step with one rail safely   Time 12   Period Weeks   Status New     PT LONG TERM GOAL #6   Title She will report intermittant pain   Time 12   Period Weeks   Status New     PT LONG TERM GOAL #7   Title She will report return to normal home tasks with 1-2 max pain   Time 12   Period Weeks   Status New     PT LONG TERM GOAL #8   Title She will improve hip strength to 4/5 to improve safe independence with walking   Time 12   Period Weeks   Status New               Plan - 04/06/16 0920    Clinical Impression Statement [name] will be seen today for dopplar ultrasound to rule out a blood clot. Gentle execise only today follow by vasopneumtic device for edema.  PT Next Visit Plan ROM ands strengthening, , nustep, vaso if pt ok's, manual for ROM and swelling; results of dopplar [name]?   Consulted and Agree with Plan of Care Patient      Patient will benefit from skilled therapeutic intervention in order to improve the following deficits and impairments:  Decreased range of motion, Difficulty walking, Obesity, Pain, Decreased strength, Increased edema, Decreased activity tolerance  Visit Diagnosis: Status post total right knee replacement  Stiffness of right knee, not elsewhere  classified  Difficulty waking  Muscle weakness (generalized)  Localized edema     Problem List Patient Active Problem List   Diagnosis Date Noted  . Acute kidney injury ([place]) 03/17/2016  . CKD (chronic kidney disease) stage 3, GFR 30-59 ml/min 03/17/2016  . Symptomatic anemia 03/17/2016  . S/P total knee arthroplasty, right 03/17/2016  . Acute blood loss anemia   . Arthritis of right knee 03/14/2016  . Unilateral primary osteoarthritis, right knee   . Hyperlipidemia LDL goal <70 12/15/2015  . Paresthesia of both feet 11/19/2015  . Pain in both lower extremities 11/19/2015  . Numbness in both hands 08/13/2015  . Panic attack 06/09/2015  . Right knee pain 05/14/2015  . Chronic renal insufficiency 09/16/2014  . Pneumothorax on right   . Atypical mycobacterium infection   . ILD (interstitial lung disease) 2/2 MAI s/p med rxn   . Routine general medical examination at a health care facility 04/03/2014  . Morbid obesity ([place]) 04/03/2014  . OSA (obstructive sleep apnea) 02/25/2014  . Chronic diastolic heart failure ([place]) 02/25/2014  . Controlled steroid-induced diabetes mellitus ([place]) 02/25/2014  . Essential hypertension 02/25/2014    Dorene Ar, PTA 04/06/2016, 9:28 AM  [facility] [address] [place], [place], [address] Phone: [phone]   Fax:  [phone]  Name: Stacey Page MRN: [id number] Date of Birth: 06-Feb-1951

## 2016-04-08 ENCOUNTER — Other Ambulatory Visit: Payer: Self-pay | Admitting: Internal Medicine

## 2016-04-09 ENCOUNTER — Other Ambulatory Visit: Payer: Self-pay | Admitting: Internal Medicine

## 2016-04-09 DIAGNOSIS — I10 Essential (primary) hypertension: Secondary | ICD-10-CM

## 2016-04-11 ENCOUNTER — Ambulatory Visit: Payer: PPO | Attending: Orthopaedic Surgery

## 2016-04-11 DIAGNOSIS — M25661 Stiffness of right knee, not elsewhere classified: Secondary | ICD-10-CM

## 2016-04-11 DIAGNOSIS — G478 Other sleep disorders: Secondary | ICD-10-CM | POA: Diagnosis not present

## 2016-04-11 DIAGNOSIS — R600 Localized edema: Secondary | ICD-10-CM | POA: Diagnosis not present

## 2016-04-11 DIAGNOSIS — M6281 Muscle weakness (generalized): Secondary | ICD-10-CM

## 2016-04-11 DIAGNOSIS — Z96651 Presence of right artificial knee joint: Secondary | ICD-10-CM

## 2016-04-11 NOTE — Therapy (Signed)
[place], [place], [address] Phone: [phone]   Fax:  [phone]  Physical Therapy Treatment  Patient Details  Name: Stacey Page MRN: [id number] Date of Birth: 08/23/1950 Referring Provider: Rodell Perna, MD  Encounter Date: 04/11/2016      PT End of Session - 04/11/16 1143    Visit Number 3   Number of Visits 24   Date for PT Re-Evaluation 06/10/16   Authorization Type Medicare Advantage   PT Start Time 1140   PT Stop Time 1240   PT Time Calculation (min) 60 min   Activity Tolerance No increased pain;Patient tolerated treatment well   Behavior During Therapy [facility] for tasks assessed/performed      Past Medical History:  Diagnosis Date  . Anxiety    doesn't take any meds  . Asthma   . CHF (congestive heart failure) (HCC)    takes Furosemide daily  . COPD (chronic obstructive pulmonary disease) (HCC)    Albuterol as needed  . Depression    doesn't take meds  . Diabetes ([place])    takes Januvia daily  . Eczema    uses cream as needed  . GERD (gastroesophageal reflux disease)   . History of bronchitis as a child   . HTN (hypertension)    takes Lisinopril and Labetalol daily  . Insomnia   . Joint pain   . Joint swelling   . OA (osteoarthritis)   . OSA (obstructive sleep apnea) 02/25/2014   wears CPAP at night  . Peripheral neuropathy (HCC)    takes Gabapentin as needed  . Pneumonia    hx of-2010  . Seasonal allergies    uses Flonase daily    Past Surgical History:  Procedure Laterality Date  . CESAREAN SECTION  x2  . COLONOSCOPY    . JOINT REPLACEMENT    . KNEE ARTHROSCOPY Right   . LUNG BIOPSY Right 06/10/2014   Procedure: LUNG BIOPSY;  Surgeon: Ivin Poot, MD;  Location: [place];  Service: Thoracic;  Laterality: Right;  . POLYPECTOMY     throat  . TOTAL KNEE ARTHROPLASTY Right 03/14/2016   Procedure: RIGHT TOTAL KNEE ARTHROPLASTY;  Surgeon: Marybelle Killings, MD;  Location: [place];  Service: Orthopedics;  Laterality: Right;  Marland Kitchen VIDEO ASSISTED THORACOSCOPY Right 06/10/2014   Procedure: VIDEO ASSISTED THORACOSCOPY;  Surgeon: Ivin Poot, MD;  Location: [place];  Service: Thoracic;  Laterality: Right;    There were no vitals filed for this visit.      Subjective Assessment - 04/11/16 1144    Subjective Cleared with assessment of blood clot. Pain limiting activity.     Currently in Pain? Yes   Pain Score 6    Pain Location Knee   Pain Orientation Right;Lateral   Pain Descriptors / Indicators Aching   Pain Type Surgical pain   Pain Radiating Towards lateral leg to ankle RT.    Pain Onset 1 to 4 weeks ago   Pain Frequency Constant   Aggravating Factors  activity   Pain Relieving Factors rest , meds   Multiple Pain Sites No            [facility] PT Assessment - 04/11/16 0001      AROM   Right Knee Flexion 83                     [facility] Adult PT Treatment/Exercise - 04/11/16 0001      Knee/Hip Exercises:  Aerobic   Nustep L2 LE only 6 min      Knee/Hip Exercises: Standing   Heel Raises Both;20 reps   Knee Flexion Right;Left;10 reps   Lateral Step Up 10 reps;Step Height: 6";Hand Hold: 2   Lateral Step Up Limitations cued to slow and use RT leg more to lift.    Other Standing Knee Exercises toe touch to RT and LT x 12 with handhold x 2      Knee/Hip Exercises: Seated   Long Arc Quad 20 reps   Long Arc Quad Weight 5 lbs.   Long [employer] Limitations RT   Other Seated Knee/Hip Exercises Quad sets RT with kne ext heel on floor knee straight.    Sit to Sand 15 reps;without UE support     Knee/Hip Exercises: Supine   Short Arc [employer] 20 reps   Short Arc Quad Sets Limitations with end rang epassive extension   Straight Leg Raises 10 reps     Vasopneumatic   Number Minutes Vasopneumatic  15 minutes   Vasopnuematic Location  Knee   Vasopneumatic Pressure Low   Vasopneumatic Temperature  32     Manual TKE stretching 10-15 sec  holds             PT Short Term Goals - 04/11/16 1226      PT SHORT TERM GOAL #1   Title she will be independent with inital HEP   Status On-going     PT SHORT TERM GOAL #2   Title She will improve active RT knee flexion to 105 degrees   Status On-going     PT SHORT TERM GOAL #3   Title She will walk full time with SPC in and out of home   Status On-going     PT SHORT TERM GOAL #4   Title She will report pain decreased 30% or more generally during the day   Status On-going     PT SHORT TERM GOAL #5   Title She will have active knee extension to -12 degrees to asssit with ambualtion   Status On-going           PT Long Term Goals - 04/04/16 1200      PT LONG TERM GOAL #1   Title She will be independent with all HEP issued   Time 12   Period Weeks   Status New     PT LONG TERM GOAL #2   Title She will walk with least resicted assistive device out of home nad no device in home   Time 12   Period Weeks   Status New     PT LONG TERM GOAL #3   Title She will have -5 degrees extension and 115 degrees of flexion to asssit with safe independnet ambulation   Time 12   Period Weeks   Status New     PT LONG TERM GOAL #4   Title She will be able to walk into store and do normal shopping withoug sitting due to pain   Time 12   Period Weeks   Status New     PT LONG TERM GOAL #5   Title She will be able to walk stairs step over step with one rail safely   Time 12   Period Weeks   Status New     PT LONG TERM GOAL #6   Title She will report intermittant pain   Time 12   Period Weeks   Status New  PT LONG TERM GOAL #7   Title She will report return to normal home tasks with 1-2 max pain   Time 12   Period Weeks   Status New     PT LONG TERM GOAL #8   Title She will improve hip strength to 4/5 to improve safe independence with walking   Time 12   Period Weeks   Status New               Plan - 04/11/16 1143    Clinical Impression  Statement No blood clots or bakers cyst in knee. Pain limiting factor . She did well but needed breaks due to [place]. It appears she is not use to putting forth a big effort  with easy fatigue and request for breaks.     PT Treatment/Interventions Cryotherapy;Electrical Stimulation;Stair training;Gait training;Passive range of motion;Patient/family education;Taping;Manual techniques;Therapeutic exercise;Therapeutic activities   PT Next Visit Plan ROM and strengthening, , nustep, vaso , manual for ROM and swelling   Consulted and Agree with Plan of Care Patient      Patient will benefit from skilled therapeutic intervention in order to improve the following deficits and impairments:  Decreased range of motion, Difficulty walking, Obesity, Pain, Decreased strength, Increased edema  Visit Diagnosis: Status post total right knee replacement  Stiffness of right knee, not elsewhere classified  Difficulty waking  Muscle weakness (generalized)  Localized edema     Problem List Patient Active Problem List   Diagnosis Date Noted  . Acute kidney injury ([place]) 03/17/2016  . CKD (chronic kidney disease) stage 3, GFR 30-59 ml/min 03/17/2016  . Symptomatic anemia 03/17/2016  . S/P total knee arthroplasty, right 03/17/2016  . Acute blood loss anemia   . Arthritis of right knee 03/14/2016  . Unilateral primary osteoarthritis, right knee   . Hyperlipidemia LDL goal <70 12/15/2015  . Paresthesia of both feet 11/19/2015  . Pain in both lower extremities 11/19/2015  . Numbness in both hands 08/13/2015  . Panic attack 06/09/2015  . Right knee pain 05/14/2015  . Chronic renal insufficiency 09/16/2014  . Pneumothorax on right   . Atypical mycobacterium infection   . ILD (interstitial lung disease) 2/2 MAI s/p med rxn   . Routine general medical examination at a health care facility 04/03/2014  . Morbid obesity ([place]) 04/03/2014  . OSA (obstructive sleep apnea) 02/25/2014  . Chronic diastolic  heart failure ([place]) 02/25/2014  . Controlled steroid-induced diabetes mellitus ([place]) 02/25/2014  . Essential hypertension 02/25/2014    Darrel Hoover  PT 04/11/2016, 12:29 PM  [facility] [address] [place], [place], [address] Phone: [phone]   Fax:  [phone]  Name: Skyelyn Scruggs MRN: [id number] Date of Birth: 1950-07-12

## 2016-04-13 ENCOUNTER — Ambulatory Visit: Payer: PPO | Admitting: Neurology

## 2016-04-14 ENCOUNTER — Ambulatory Visit (INDEPENDENT_AMBULATORY_CARE_PROVIDER_SITE_OTHER): Payer: PPO | Admitting: Nurse Practitioner

## 2016-04-14 ENCOUNTER — Ambulatory Visit: Payer: PPO | Admitting: Physical Therapy

## 2016-04-14 ENCOUNTER — Encounter: Payer: Self-pay | Admitting: Nurse Practitioner

## 2016-04-14 VITALS — BP 118/64 | HR 66 | Temp 97.5°F | Ht 68.0 in | Wt 286.0 lb

## 2016-04-14 DIAGNOSIS — Z96651 Presence of right artificial knee joint: Secondary | ICD-10-CM | POA: Diagnosis not present

## 2016-04-14 DIAGNOSIS — M6281 Muscle weakness (generalized): Secondary | ICD-10-CM

## 2016-04-14 DIAGNOSIS — G478 Other sleep disorders: Secondary | ICD-10-CM

## 2016-04-14 DIAGNOSIS — D649 Anemia, unspecified: Secondary | ICD-10-CM

## 2016-04-14 DIAGNOSIS — F410 Panic disorder [episodic paroxysmal anxiety] without agoraphobia: Secondary | ICD-10-CM

## 2016-04-14 DIAGNOSIS — I10 Essential (primary) hypertension: Secondary | ICD-10-CM

## 2016-04-14 DIAGNOSIS — E785 Hyperlipidemia, unspecified: Secondary | ICD-10-CM | POA: Diagnosis not present

## 2016-04-14 DIAGNOSIS — R600 Localized edema: Secondary | ICD-10-CM

## 2016-04-14 DIAGNOSIS — M25661 Stiffness of right knee, not elsewhere classified: Secondary | ICD-10-CM

## 2016-04-14 MED ORDER — ATORVASTATIN CALCIUM 20 MG PO TABS: 20.0000 mg | ORAL_TABLET | Freq: Every day | ORAL | 1 refills | Status: DC

## 2016-04-14 MED ORDER — LABETALOL HCL 200 MG PO TABS: 200.0000 mg | ORAL_TABLET | Freq: Two times a day (BID) | ORAL | 1 refills | Status: DC

## 2016-04-14 NOTE — Therapy (Signed)
[place], [place], [address] Phone: [phone]   Fax:  [phone]  Physical Therapy Treatment  Patient Details  Name: Stacey Page MRN: [id number] Date of Birth: 08/13/1950 Referring Provider: Rodell Perna, MD  Encounter Date: 04/14/2016      PT End of Session - 04/14/16 0942    Visit Number 4   Number of Visits 24   Date for PT Re-Evaluation 06/10/16   PT Start Time 0849   PT Stop Time 0947   PT Time Calculation (min) 58 min   Activity Tolerance Patient tolerated treatment well   Behavior During Therapy [facility] for tasks assessed/performed      Past Medical History:  Diagnosis Date  . Anxiety    doesn't take any meds  . Asthma   . CHF (congestive heart failure) (HCC)    takes Furosemide daily  . COPD (chronic obstructive pulmonary disease) (HCC)    Albuterol as needed  . Depression    doesn't take meds  . Diabetes ([place])    takes Januvia daily  . Eczema    uses cream as needed  . GERD (gastroesophageal reflux disease)   . History of bronchitis as a child   . HTN (hypertension)    takes Lisinopril and Labetalol daily  . Insomnia   . Joint pain   . Joint swelling   . OA (osteoarthritis)   . OSA (obstructive sleep apnea) 02/25/2014   wears CPAP at night  . Peripheral neuropathy (HCC)    takes Gabapentin as needed  . Pneumonia    hx of-2010  . Seasonal allergies    uses Flonase daily    Past Surgical History:  Procedure Laterality Date  . CESAREAN SECTION  x2  . COLONOSCOPY    . JOINT REPLACEMENT    . KNEE ARTHROSCOPY Right   . LUNG BIOPSY Right 06/10/2014   Procedure: LUNG BIOPSY;  Surgeon: Ivin Poot, MD;  Location: [place];  Service: Thoracic;  Laterality: Right;  . POLYPECTOMY     throat  . TOTAL KNEE ARTHROPLASTY Right 03/14/2016   Procedure: RIGHT TOTAL KNEE ARTHROPLASTY;  Surgeon: Marybelle Killings, MD;  Location: [place];  Service: Orthopedics;  Laterality: Right;  Marland Kitchen VIDEO  ASSISTED THORACOSCOPY Right 06/10/2014   Procedure: VIDEO ASSISTED THORACOSCOPY;  Surgeon: Ivin Poot, MD;  Location: [place];  Service: Thoracic;  Laterality: Right;    There were no vitals filed for this visit.      Subjective Assessment - 04/14/16 0853    Subjective I can get around easier.  5/10   Currently in Pain? Yes   Pain Score 5    Pain Orientation Right            [facility] PT Assessment - 04/14/16 0001      AROM   Right Knee Flexion 93                     [facility] Adult PT Treatment/Exercise - 04/14/16 0001      Knee/Hip Exercises: Stretches   Quad Stretch 10 seconds  10 x  standing foot on 8 " step   Knee: Self-Stretch to increase Flexion 5 reps   Knee: Self-Stretch Limitations with towel roll under knee   Gastroc Stretch 3 reps;30 seconds   Gastroc Stretch Limitations incline board , Both     Knee/Hip Exercises: Aerobic   Nustep L5 , 6.5  minutes  LE only     Knee/Hip  Exercises: Standing   Heel Raises Both;20 reps   Heel Raises Limitations unable to do single   Other Standing Knee Exercises Wall slides facing wall 5 X,  2 sets heavy cues.  extra time needed for education for doing the task.       Knee/Hip Exercises: Seated   Heel Slides 1 set;10 reps  foot on pillow case   Sit to Sand --  3 X 3 different levels of mat height.  cued for equal shift.     Vasopneumatic   Number Minutes Vasopneumatic  15 minutes   Vasopnuematic Location  Knee   Vasopneumatic Pressure Low   Vasopneumatic Temperature  32  leg elevated                PT Education - 04/14/16 0941    Education provided Yes   Education Details self stretch   Person(s) Educated Patient   Methods Explanation;Demonstration;Verbal cues   Comprehension Verbalized understanding;Returned demonstration          PT Short Term Goals - 04/11/16 1226      PT SHORT TERM GOAL #1   Title she will be independent with inital HEP   Status On-going     PT SHORT TERM GOAL #2    Title She will improve active RT knee flexion to 105 degrees   Status On-going     PT SHORT TERM GOAL #3   Title She will walk full time with SPC in and out of home   Status On-going     PT SHORT TERM GOAL #4   Title She will report pain decreased 30% or more generally during the day   Status On-going     PT SHORT TERM GOAL #5   Title She will have active knee extension to -12 degrees to asssit with ambualtion   Status On-going           PT Long Term Goals - 04/04/16 1200      PT LONG TERM GOAL #1   Title She will be independent with all HEP issued   Time 12   Period Weeks   Status New     PT LONG TERM GOAL #2   Title She will walk with least resicted assistive device out of home nad no device in home   Time 12   Period Weeks   Status New     PT LONG TERM GOAL #3   Title She will have -5 degrees extension and 115 degrees of flexion to asssit with safe independnet ambulation   Time 12   Period Weeks   Status New     PT LONG TERM GOAL #4   Title She will be able to walk into store and do normal shopping withoug sitting due to pain   Time 12   Period Weeks   Status New     PT LONG TERM GOAL #5   Title She will be able to walk stairs step over step with one rail safely   Time 12   Period Weeks   Status New     PT LONG TERM GOAL #6   Title She will report intermittant pain   Time 12   Period Weeks   Status New     PT LONG TERM GOAL #7   Title She will report return to normal home tasks with 1-2 max pain   Time 12   Period Weeks   Status New     PT LONG TERM  GOAL #8   Title She will improve hip strength to 4/5 to improve safe independence with walking   Time 12   Period Weeks   Status New               Plan - 04/14/16 9450    Clinical Impression Statement 93 AROM post session.   There were frequent rests for fatigue and to gather her thoughts.  She seems to be adherent with her HEP.  Pain 7-8/10 post exercise.   PT Next Visit Plan ROM and  strengthening, , nustep, vaso , manual for ROM and swelling,  consider strap mobilization   Consulted and Agree with Plan of Care Patient      Patient will benefit from skilled therapeutic intervention in order to improve the following deficits and impairments:  Decreased range of motion, Difficulty walking, Obesity, Pain, Decreased strength, Increased edema  Visit Diagnosis: Stiffness of right knee, not elsewhere classified  Difficulty waking  Muscle weakness (generalized)  Localized edema     Problem List Patient Active Problem List   Diagnosis Date Noted  . Acute kidney injury ([place]) 03/17/2016  . CKD (chronic kidney disease) stage 3, GFR 30-59 ml/min 03/17/2016  . Symptomatic anemia 03/17/2016  . S/P total knee arthroplasty, right 03/17/2016  . Acute blood loss anemia   . Arthritis of right knee 03/14/2016  . Unilateral primary osteoarthritis, right knee   . Hyperlipidemia LDL goal <70 12/15/2015  . Paresthesia of both feet 11/19/2015  . Pain in both lower extremities 11/19/2015  . Numbness in both hands 08/13/2015  . Panic attack 06/09/2015  . Right knee pain 05/14/2015  . Chronic renal insufficiency 09/16/2014  . Pneumothorax on right   . Atypical mycobacterium infection   . ILD (interstitial lung disease) 2/2 MAI s/p med rxn   . Routine general medical examination at a health care facility 04/03/2014  . Morbid obesity ([employer]) 04/03/2014  . OSA (obstructive sleep apnea) 02/25/2014  . Chronic diastolic heart failure ([place]) 02/25/2014  . Controlled steroid-induced diabetes mellitus ([place]) 02/25/2014  . Essential hypertension 02/25/2014    Lynnex Fulp PTA 04/14/2016, 9:46 AM  [facility] [address] [place], [place], [address] Phone: [phone]   Fax:  [phone]  Name: Stacey Page MRN: [id number] Date of Birth: [birth year]

## 2016-04-14 NOTE — Patient Instructions (Signed)
Roll towel place behind knee.  Flex knee 5-10 x  1-3 x a day.  Verbal instructions.

## 2016-04-14 NOTE — Progress Notes (Signed)
Pre visit review using our clinic review tool, if applicable. No additional management support is needed unless otherwise documented below in the visit note. 

## 2016-04-14 NOTE — Patient Instructions (Addendum)
Sign release to get records from [facility] (need latest lab results).  Stacey Page was advised about the need to use a maintenance medication to control anxiety. Xanax will no longer be refilled at this time. She verbalized understanding and decided not take any medication at this time.

## 2016-04-14 NOTE — Progress Notes (Signed)
Subjective:  Patient ID: Stacey Page, female    DOB: 1950/03/07  Age: 66 y.o. MRN: [id number]  CC: Follow-up (follow up from knee rehab. refill for xanax and labetalol. lipitor consult. )   HPI  S/p Right Knee Arthroplasty: Done 03/14/2016, discharged to rehab 03/19/2016.  Received 3unit of blood during hospitalization Rehab with [facility]. Discharged from rehab 04/02/16. Pain controlled with percocet  HTN: Controlled with lisinopril, labetalol and lasix. She states labs were done by rehab facility 1week ago.  Dm: Controlled with januvia No glucose check at home.  Hyperlipidemia: Has not been taking Lipitor as prescribed.  Asthma: Controlled with ventolin prn, singulair, and flonase.  Anxiety: Xanax prn while in rehab. Has not had any since discharge. Xanax was discontinued by previous pcp prior to knee surgery. No maintenance medication at this time.  Anemia: 3units of PRBCS transfused during hospitalization. Current use of ferrous sulfate once a day. She states labs were done by rehab facility 1week ago.  Outpatient Medications Prior to Visit  Medication Sig Dispense Refill  . albuterol (PROAIR HFA) 108 (90 Base) MCG/ACT inhaler Inhale 2 puffs into the lungs every 6 (six) hours as needed for wheezing or shortness of breath. 1 Inhaler 3  . aspirin EC 325 MG EC tablet Take 1 tablet (325 mg total) by mouth daily with breakfast. 30 tablet 0  . Clobetasol Prop Emollient Base (CLOBETASOL PROPIONATE E) 0.05 % emollient cream Apply 1 application topically daily as needed (skin irritations). 30 g 2  . fluticasone (FLONASE) 50 MCG/ACT nasal spray PLACE 1 SPRAY INTO BOTH NOSTRILS DAILY. 16 g 2  . furosemide (LASIX) 40 MG tablet TAKE 1 TABLET BY MOUTH EVERY DAY 90 tablet 0  . lisinopril (PRINIVIL,ZESTRIL) 20 MG tablet Take 1 tablet (20 mg total) by mouth daily. 90 tablet 1  . methocarbamol (ROBAXIN) 500 MG tablet Take 1 tablet (500 mg total) by  mouth every 8 (eight) hours as needed for muscle spasms. 50 tablet 0  . montelukast (SINGULAIR) 10 MG tablet TAKE 1 TABLET (10 MG TOTAL) BY MOUTH AT BEDTIME. 90 tablet 1  . oxyCODONE-acetaminophen (PERCOCET/ROXICET) 5-325 MG tablet Take 1 tablet by mouth every 8 (eight) hours as needed for severe pain. 50 tablet 0  . potassium citrate (UROCIT-K) 10 MEQ (1080 MG) SR tablet Take 1 tablet (10 mEq total) by mouth daily. 90 tablet 1  . sitaGLIPtin (JANUVIA) 100 MG tablet Take 1 tablet (100 mg total) by mouth daily. 30 tablet 3  . ALPRAZolam (XANAX) 0.25 MG tablet Take 1 tablet (0.25 mg total) by mouth 2 (two) times daily as needed for anxiety. 30 tablet 1  . atorvastatin (LIPITOR) 20 MG tablet Take 1 tablet (20 mg total) by mouth daily at 6 PM. 90 tablet 1  . labetalol (NORMODYNE) 200 MG tablet Take 1 tablet (200 mg total) by mouth 2 (two) times daily. 180 tablet 1  . methocarbamol (ROBAXIN) 500 MG tablet Take 1 tablet (500 mg total) by mouth every 6 (six) hours as needed for muscle spasms. 60 tablet 0  . oxyCODONE-acetaminophen (ROXICET) 5-325 MG tablet Take 1 tablet by mouth every 6 (six) hours as needed for severe pain. 60 tablet 0  . gabapentin (NEURONTIN) 300 MG capsule Take 1 capsule (300 mg total) by mouth 3 (three) times daily. (Patient not taking: Reported on 04/04/2016) 90 capsule 3   No facility-administered medications prior to visit.     ROS See HPI  Objective:  BP 118/64  Pulse 66   Temp 97.5 F (36.4 C)   Ht 5\' 8"  (1.727 m)   Wt 286 lb (129.7 kg)   SpO2 98%   BMI 43.49 kg/m   BP Readings from Last 3 Encounters:  04/14/16 118/64  03/29/16 (!) 105/59  03/19/16 (!) 150/56    Wt Readings from Last 3 Encounters:  04/14/16 286 lb (129.7 kg)  04/05/16 270 lb (122.5 kg)  03/29/16 270 lb (122.5 kg)    Physical Exam  Constitutional: She is oriented to person, place, and time. No distress.  Neck: Normal range of motion. Neck supple.  Cardiovascular: Normal rate and normal  heart sounds.   Pulmonary/Chest: Effort normal and breath sounds normal.  Abdominal: Soft. Bowel sounds are normal.  Musculoskeletal: She exhibits edema and tenderness. She exhibits no deformity.       Right knee: She exhibits decreased range of motion and swelling. She exhibits no effusion, no ecchymosis and no erythema. Tenderness found.       Legs: Limping gait without cane  Neurological: She is alert and oriented to person, place, and time.  Skin: Skin is warm and dry.  Vitals reviewed.   Lab Results  Component Value Date   WBC 7.9 03/18/2016   HGB 10.1 (L) 03/18/2016   HCT 30.5 (L) 03/18/2016   PLT 188 03/18/2016   GLUCOSE 105 (H) 03/19/2016   CHOL 208 (H) 12/15/2015   TRIG 88.0 12/15/2015   HDL 61.00 12/15/2015   LDLCALC 130 (H) 12/15/2015   ALT 12 (L) 03/07/2016   AST 17 03/07/2016   NA 135 03/19/2016   K 4.2 03/19/2016   CL 101 03/19/2016   CREATININE 1.27 (H) 03/19/2016   BUN 21 (H) 03/19/2016   CO2 24 03/19/2016   TSH 0.48 12/15/2015   INR 1.11 06/16/2014   HGBA1C 5.5 03/07/2016    No results found.  Assessment & Plan:   Seher was seen today for follow-up.  Diagnoses and all orders for this visit:  Symptomatic anemia  Essential hypertension -     labetalol (NORMODYNE) 200 MG tablet; Take 1 tablet (200 mg total) by mouth 2 (two) times daily.  Hyperlipidemia LDL goal <70 -     atorvastatin (LIPITOR) 20 MG tablet; Take 1 tablet (20 mg total) by mouth daily at 6 PM.  Panic attack   I have discontinued Ms. Noyes's ALPRAZolam and gabapentin. I am also having her maintain her potassium citrate, lisinopril, Clobetasol Prop Emollient Base, sitaGLIPtin, fluticasone, albuterol, aspirin, oxyCODONE-acetaminophen, methocarbamol, montelukast, furosemide, labetalol, and atorvastatin.  Meds ordered this encounter  Medications  . labetalol (NORMODYNE) 200 MG tablet    Sig: Take 1 tablet (200 mg total) by mouth 2 (two) times daily.    Dispense:  180 tablet     Refill:  1    Order Specific Question:   Supervising Provider    Answer:   Cassandria Anger [[id number]]  . atorvastatin (LIPITOR) 20 MG tablet    Sig: Take 1 tablet (20 mg total) by mouth daily at 6 PM.    Dispense:  90 tablet    Refill:  1    Order Specific Question:   Supervising Provider    Answer:   Cassandria Anger [[id number]]    Follow-up: Return in about 3 months (around 07/15/2016) for DM and HTN, hyperlipidemia (fasting).  Wilfred Lacy, NP

## 2016-04-15 NOTE — Assessment & Plan Note (Signed)
Xanax discontinued. Patient does not want any maintenance medication at this time.

## 2016-04-18 ENCOUNTER — Encounter: Payer: Self-pay | Admitting: Physical Therapy

## 2016-04-18 ENCOUNTER — Ambulatory Visit: Payer: PPO | Admitting: Physical Therapy

## 2016-04-18 DIAGNOSIS — M25661 Stiffness of right knee, not elsewhere classified: Secondary | ICD-10-CM

## 2016-04-18 DIAGNOSIS — M6281 Muscle weakness (generalized): Secondary | ICD-10-CM

## 2016-04-18 DIAGNOSIS — Z96651 Presence of right artificial knee joint: Secondary | ICD-10-CM

## 2016-04-18 DIAGNOSIS — R600 Localized edema: Secondary | ICD-10-CM

## 2016-04-18 DIAGNOSIS — G478 Other sleep disorders: Secondary | ICD-10-CM

## 2016-04-18 NOTE — Therapy (Signed)
[place], [place], [address] Phone: [phone]   Fax:  [phone]  Physical Therapy Treatment  Patient Details  Name: Shaakira Borrero MRN: [id number] Date of Birth: October 14, 1950 Referring Provider: Rodell Perna, MD  Encounter Date: 04/18/2016      PT End of Session - 04/18/16 1200    Visit Number 5   Number of Visits 24   Date for PT Re-Evaluation 06/10/16   PT Start Time 1103   PT Stop Time 1145   PT Time Calculation (min) 42 min   Activity Tolerance Patient tolerated treatment well   Behavior During Therapy [facility] for tasks assessed/performed      Past Medical History:  Diagnosis Date  . Anxiety    doesn't take any meds  . Asthma   . CHF (congestive heart failure) (HCC)    takes Furosemide daily  . COPD (chronic obstructive pulmonary disease) (HCC)    Albuterol as needed  . Depression    doesn't take meds  . Diabetes ([place])    takes Januvia daily  . Eczema    uses cream as needed  . GERD (gastroesophageal reflux disease)   . History of bronchitis as a child   . HTN (hypertension)    takes Lisinopril and Labetalol daily  . Insomnia   . Joint pain   . Joint swelling   . OA (osteoarthritis)   . OSA (obstructive sleep apnea) 02/25/2014   wears CPAP at night  . Peripheral neuropathy (HCC)    takes Gabapentin as needed  . Pneumonia    hx of-2010  . Seasonal allergies    uses Flonase daily    Past Surgical History:  Procedure Laterality Date  . CESAREAN SECTION  x2  . COLONOSCOPY    . JOINT REPLACEMENT    . KNEE ARTHROSCOPY Right   . LUNG BIOPSY Right 06/10/2014   Procedure: LUNG BIOPSY;  Surgeon: Ivin Poot, MD;  Location: [place];  Service: Thoracic;  Laterality: Right;  . POLYPECTOMY     throat  . TOTAL KNEE ARTHROPLASTY Right 03/14/2016   Procedure: RIGHT TOTAL KNEE ARTHROPLASTY;  Surgeon: Marybelle Killings, MD;  Location: [place];  Service: Orthopedics;  Laterality: Right;  Marland Kitchen VIDEO  ASSISTED THORACOSCOPY Right 06/10/2014   Procedure: VIDEO ASSISTED THORACOSCOPY;  Surgeon: Ivin Poot, MD;  Location: [place];  Service: Thoracic;  Laterality: Right;    There were no vitals filed for this visit.      Subjective Assessment - 04/18/16 1023    Subjective 6/10. pain.  Doinmg her exercises.  Not feeling well due to not sleeping well due to pain.     Currently in Pain? Yes   Pain Score 6    Pain Location Knee   Pain Orientation Right   Pain Descriptors / Indicators Aching   Pain Type Surgical pain   Pain Radiating Towards to mid shin   Pain Frequency Intermittent   Aggravating Factors  Sometimes cannot find a comfortable spot.   Pain Relieving Factors rest, meds   Multiple Pain Sites No                         [facility] Adult PT Treatment/Exercise - 04/18/16 0001      Knee/Hip Exercises: Aerobic   Nustep L6 , 6 minutes     Knee/Hip Exercises: Seated   Long Arc Quad 2 sets;10 reps   Long Arc Quad Weight 0 lbs.  0, 4   Hamstring Curl 10 reps;2 sets    Hamstring Weights --  0, yellow band HEP     Knee/Hip Exercises: Supine   Quad Sets 1 set;10 reps   Heel Slides 10 reps   Heel Slides Limitations strap, pain     Moist Heat Therapy   Number Minutes Moist Heat 15 Minutes   Moist Heat Location Knee  concurrent with stretches     Manual Therapy   Manual therapy comments strap mobs for flexion,  tibia rotation for extension,  PROM knee                PT Education - 04/18/16 1200    Education Details HEP,  How to stretch knee    Person(s) Educated Patient   Methods Explanation;Demonstration;Verbal cues   Comprehension Verbalized understanding;Returned demonstration          PT Short Term Goals - 04/11/16 1226      PT SHORT TERM GOAL #1   Title she will be independent with inital HEP   Status On-going     PT SHORT TERM GOAL #2   Title She will improve active RT knee flexion to 105 degrees   Status On-going     PT SHORT TERM  GOAL #3   Title She will walk full time with SPC in and out of home   Status On-going     PT SHORT TERM GOAL #4   Title She will report pain decreased 30% or more generally during the day   Status On-going     PT SHORT TERM GOAL #5   Title She will have active knee extension to -12 degrees to asssit with ambualtion   Status On-going           PT Long Term Goals - 04/04/16 1200      PT LONG TERM GOAL #1   Title She will be independent with all HEP issued   Time 12   Period Weeks   Status New     PT LONG TERM GOAL #2   Title She will walk with least resicted assistive device out of home nad no device in home   Time 12   Period Weeks   Status New     PT LONG TERM GOAL #3   Title She will have -5 degrees extension and 115 degrees of flexion to asssit with safe independnet ambulation   Time 12   Period Weeks   Status New     PT LONG TERM GOAL #4   Title She will be able to walk into store and do normal shopping withoug sitting due to pain   Time 12   Period Weeks   Status New     PT LONG TERM GOAL #5   Title She will be able to walk stairs step over step with one rail safely   Time 12   Period Weeks   Status New     PT LONG TERM GOAL #6   Title She will report intermittant pain   Time 12   Period Weeks   Status New     PT LONG TERM GOAL #7   Title She will report return to normal home tasks with 1-2 max pain   Time 12   Period Weeks   Status New     PT LONG TERM GOAL #8   Title She will improve hip strength to 4/5 to improve safe independence with walking   Time 12  Period Weeks   Status New               Plan - 04/18/16 1201    Clinical Impression Statement 103 AAROM flexion.   Progress toward ROM goals.  patient exercisesn 20 minutes a day.  I asked her to make this her full time job.     PT Next Visit Plan ROM and strengthening, , nustep, vaso , manual for ROM and swelling,  consider strap mobilization   PT Home Exercise Plan Hamstring  curls,  stretches,    Consulted and Agree with Plan of Care Patient      Patient will benefit from skilled therapeutic intervention in order to improve the following deficits and impairments:  Decreased range of motion, Difficulty walking, Obesity, Pain, Decreased strength, Increased edema  Visit Diagnosis: Stiffness of right knee, not elsewhere classified  Difficulty waking  Muscle weakness (generalized)  Localized edema  Status post total right knee replacement     Problem List Patient Active Problem List   Diagnosis Date Noted  . Acute kidney injury ([place]) 03/17/2016  . CKD (chronic kidney disease) stage 3, GFR 30-59 ml/min 03/17/2016  . Symptomatic anemia 03/17/2016  . S/P total knee arthroplasty, right 03/17/2016  . Acute blood loss anemia   . Arthritis of right knee 03/14/2016  . Unilateral primary osteoarthritis, right knee   . Hyperlipidemia LDL goal <70 12/15/2015  . Paresthesia of both feet 11/19/2015  . Pain in both lower extremities 11/19/2015  . Numbness in both hands 08/13/2015  . Panic attack 06/09/2015  . Right knee pain 05/14/2015  . Chronic renal insufficiency 09/16/2014  . Pneumothorax on right   . Atypical mycobacterium infection   . ILD (interstitial lung disease) 2/2 MAI s/p med rxn   . Routine general medical examination at a health care facility 04/03/2014  . Morbid obesity ([place]) 04/03/2014  . OSA (obstructive sleep apnea) 02/25/2014  . Chronic diastolic heart failure ([place]) 02/25/2014  . Controlled steroid-induced diabetes mellitus ([place]) 02/25/2014  . Essential hypertension 02/25/2014    Charnise Lovan PTA 04/18/2016, 12:03 PM  [facility] [address] [place], [place], [address] Phone: [phone]   Fax:  [phone]  Name: Chavela Justiniano MRN: [id number] Date of Birth: 09/19/1950

## 2016-04-18 NOTE — Patient Instructions (Signed)
From ex drawer,  Knee band flexion 1-3 x a day 10-20 X

## 2016-04-20 ENCOUNTER — Ambulatory Visit: Payer: PPO

## 2016-04-20 DIAGNOSIS — R600 Localized edema: Secondary | ICD-10-CM

## 2016-04-20 DIAGNOSIS — M25661 Stiffness of right knee, not elsewhere classified: Secondary | ICD-10-CM

## 2016-04-20 DIAGNOSIS — G478 Other sleep disorders: Secondary | ICD-10-CM

## 2016-04-20 DIAGNOSIS — Z96651 Presence of right artificial knee joint: Secondary | ICD-10-CM | POA: Diagnosis not present

## 2016-04-20 DIAGNOSIS — M6281 Muscle weakness (generalized): Secondary | ICD-10-CM

## 2016-04-20 NOTE — Therapy (Signed)
[place], [place], [address] Phone: [phone]   Fax:  [phone]  Physical Therapy Treatment  Patient Details  Name: Stacey Page MRN: [id number] Date of Birth: 1950-08-14 Referring Provider: Rodell Perna, MD  Encounter Date: 04/20/2016      PT End of Session - 04/20/16 1105    Visit Number 6   Number of Visits 24   Date for PT Re-Evaluation 06/10/16   Authorization Type Medicare Advantage   Authorization Time Period KX at visit 15   PT Start Time 1101   PT Stop Time 1146   PT Time Calculation (min) 45 min   Activity Tolerance Patient tolerated treatment well   Behavior During Therapy [facility] for tasks assessed/performed      Past Medical History:  Diagnosis Date  . Anxiety    doesn't take any meds  . Asthma   . CHF (congestive heart failure) (HCC)    takes Furosemide daily  . COPD (chronic obstructive pulmonary disease) (HCC)    Albuterol as needed  . Depression    doesn't take meds  . Diabetes ([place])    takes Januvia daily  . Eczema    uses cream as needed  . GERD (gastroesophageal reflux disease)   . History of bronchitis as a child   . HTN (hypertension)    takes Lisinopril and Labetalol daily  . Insomnia   . Joint pain   . Joint swelling   . OA (osteoarthritis)   . OSA (obstructive sleep apnea) 02/25/2014   wears CPAP at night  . Peripheral neuropathy (HCC)    takes Gabapentin as needed  . Pneumonia    hx of-2010  . Seasonal allergies    uses Flonase daily    Past Surgical History:  Procedure Laterality Date  . CESAREAN SECTION  x2  . COLONOSCOPY    . JOINT REPLACEMENT    . KNEE ARTHROSCOPY Right   . LUNG BIOPSY Right 06/10/2014   Procedure: LUNG BIOPSY;  Surgeon: Ivin Poot, MD;  Location: [place];  Service: Thoracic;  Laterality: Right;  . POLYPECTOMY     throat  . TOTAL KNEE ARTHROPLASTY Right 03/14/2016   Procedure: RIGHT TOTAL KNEE ARTHROPLASTY;  Surgeon: Marybelle Killings, MD;  Location: [place];  Service: Orthopedics;  Laterality: Right;  Marland Kitchen VIDEO ASSISTED THORACOSCOPY Right 06/10/2014   Procedure: VIDEO ASSISTED THORACOSCOPY;  Surgeon: Ivin Poot, MD;  Location: [place];  Service: Thoracic;  Laterality: Right;    There were no vitals filed for this visit.      Subjective Assessment - 04/20/16 1107    Subjective Feel much better , Mobilization Monday helped  Did some exercises   Currently in Pain? No/denies            [facility] PT Assessment - 04/20/16 0001      AROM   Right Knee Flexion 98     PROM   Right Knee Flexion 102                     [facility] Adult PT Treatment/Exercise - 04/20/16 0001      Knee/Hip Exercises: Aerobic   Nustep L6 , 8 minutes     Knee/Hip Exercises: Seated   Long Arc Quad Right   Long Arc Quad Limitations RT, 25 reps 10 sec hold   Hamstring Limitations 25 reps  red band     Manual Therapy   Manual therapy comments strap mobs for  flexion,  tibia rotation for extension,  PROM knee     Also STW to quads and  Contract relax  RT quad nad patella mobs             PT Short Term Goals - 04/20/16 1132      PT SHORT TERM GOAL #1   Title she will be independent with inital HEP   Status Achieved     PT SHORT TERM GOAL #2   Title She will improve active RT knee flexion to 105 degrees   Baseline 100 degrees today   Status On-going     PT SHORT TERM GOAL #3   Title She will walk full time with SPC in and out of home   Status Achieved     PT SHORT TERM GOAL #4   Title She will report pain decreased 30% or more generally during the day   Baseline more variable now   Status Partially Met     PT SHORT TERM GOAL #5   Title She will have active knee extension to -12 degrees to asssit with ambualtion   Baseline -18 degrees today   Status On-going           PT Long Term Goals - 04/04/16 1200      PT LONG TERM GOAL #1   Title She will be independent with all HEP issued   Time 12    Period Weeks   Status New     PT LONG TERM GOAL #2   Title She will walk with least resicted assistive device out of home nad no device in home   Time 12   Period Weeks   Status New     PT LONG TERM GOAL #3   Title She will have -5 degrees extension and 115 degrees of flexion to asssit with safe independnet ambulation   Time 12   Period Weeks   Status New     PT LONG TERM GOAL #4   Title She will be able to walk into store and do normal shopping withoug sitting due to pain   Time 12   Period Weeks   Status New     PT LONG TERM GOAL #5   Title She will be able to walk stairs step over step with one rail safely   Time 12   Period Weeks   Status New     PT LONG TERM GOAL #6   Title She will report intermittant pain   Time 12   Period Weeks   Status New     PT LONG TERM GOAL #7   Title She will report return to normal home tasks with 1-2 max pain   Time 12   Period Weeks   Status New     PT LONG TERM GOAL #8   Title She will improve hip strength to 4/5 to improve safe independence with walking   Time 12   Period Weeks   Status New               Plan - 04/20/16 1105    Clinical Impression Statement she nis progressing . Today with decr pain until we started stretching. .   Tolerating walking without device. Progressing toward  short term goals.   PT Treatment/Interventions Cryotherapy;Electrical Stimulation;Stair training;Gait training;Passive range of motion;Patient/family education;Taping;Manual techniques;Therapeutic exercise;Therapeutic activities   PT Next Visit Plan ROM and strengthening, , nustep, vaso , manual for ROM and swelling,  consider strap mobilization  FOTO next 1-2 sessions   PT Home Exercise Plan Hamstring curls,  stretches,    Consulted and Agree with Plan of Care Patient      Patient will benefit from skilled therapeutic intervention in order to improve the following deficits and impairments:  Decreased range of motion, Difficulty  walking, Obesity, Pain, Decreased strength, Increased edema  Visit Diagnosis: Stiffness of right knee, not elsewhere classified  Difficulty waking  Muscle weakness (generalized)  Localized edema  Status post total right knee replacement     Problem List Patient Active Problem List   Diagnosis Date Noted  . Acute kidney injury ([place]) 03/17/2016  . CKD (chronic kidney disease) stage 3, GFR 30-59 ml/min 03/17/2016  . Symptomatic anemia 03/17/2016  . S/P total knee arthroplasty, right 03/17/2016  . Acute blood loss anemia   . Arthritis of right knee 03/14/2016  . Unilateral primary osteoarthritis, right knee   . Hyperlipidemia LDL goal <70 12/15/2015  . Paresthesia of both feet 11/19/2015  . Pain in both lower extremities 11/19/2015  . Numbness in both hands 08/13/2015  . Panic attack 06/09/2015  . Right knee pain 05/14/2015  . Chronic renal insufficiency 09/16/2014  . Pneumothorax on right   . Atypical mycobacterium infection   . ILD (interstitial lung disease) 2/2 MAI s/p med rxn   . Routine general medical examination at a health care facility 04/03/2014  . Morbid obesity ([place]) 04/03/2014  . OSA (obstructive sleep apnea) 02/25/2014  . Chronic diastolic heart failure ([place]) 02/25/2014  . Controlled steroid-induced diabetes mellitus ([place]) 02/25/2014  . Essential hypertension 02/25/2014    Darrel Hoover  PT 04/20/2016, 11:43 AM  [facility] [address] [place], [place], [address] Phone: [phone]   Fax:  [phone]  Name: Stacey Page MRN: [id number] Date of Birth: July 28, 1950

## 2016-04-25 ENCOUNTER — Ambulatory Visit: Payer: PPO

## 2016-04-25 DIAGNOSIS — Z96651 Presence of right artificial knee joint: Secondary | ICD-10-CM | POA: Diagnosis not present

## 2016-04-25 DIAGNOSIS — R600 Localized edema: Secondary | ICD-10-CM

## 2016-04-25 DIAGNOSIS — M6281 Muscle weakness (generalized): Secondary | ICD-10-CM

## 2016-04-25 DIAGNOSIS — G478 Other sleep disorders: Secondary | ICD-10-CM

## 2016-04-25 DIAGNOSIS — M25661 Stiffness of right knee, not elsewhere classified: Secondary | ICD-10-CM

## 2016-04-25 IMAGING — CR DG CHEST 2V
2 series · 2 of 2 positions shown · non-contrast
Comparison: January 20, 2014.

CLINICAL DATA: Pulmonary fibrosis.  Shortness of breath.

EXAM:
CHEST  2 VIEW

[w chest pa]
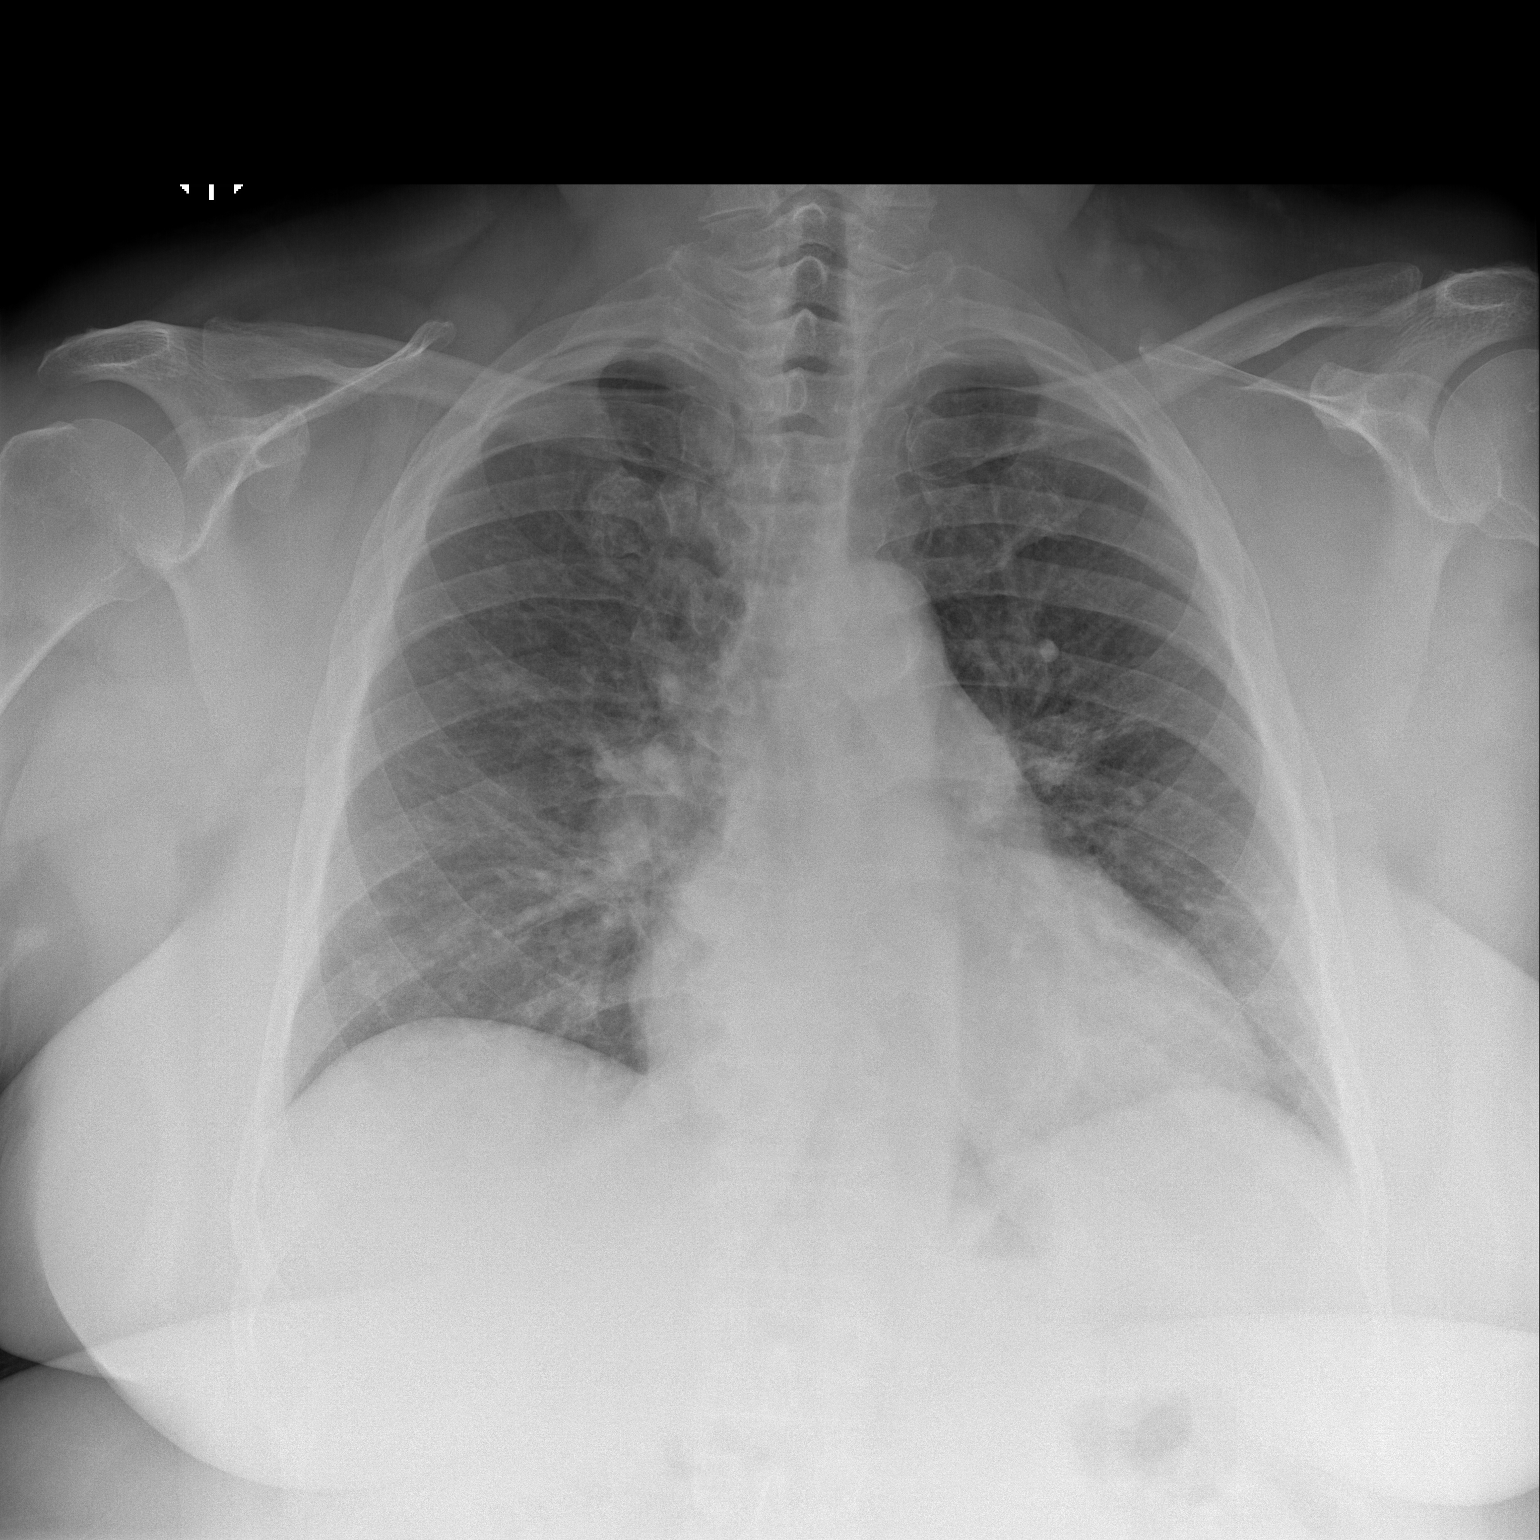

[w chest lat]
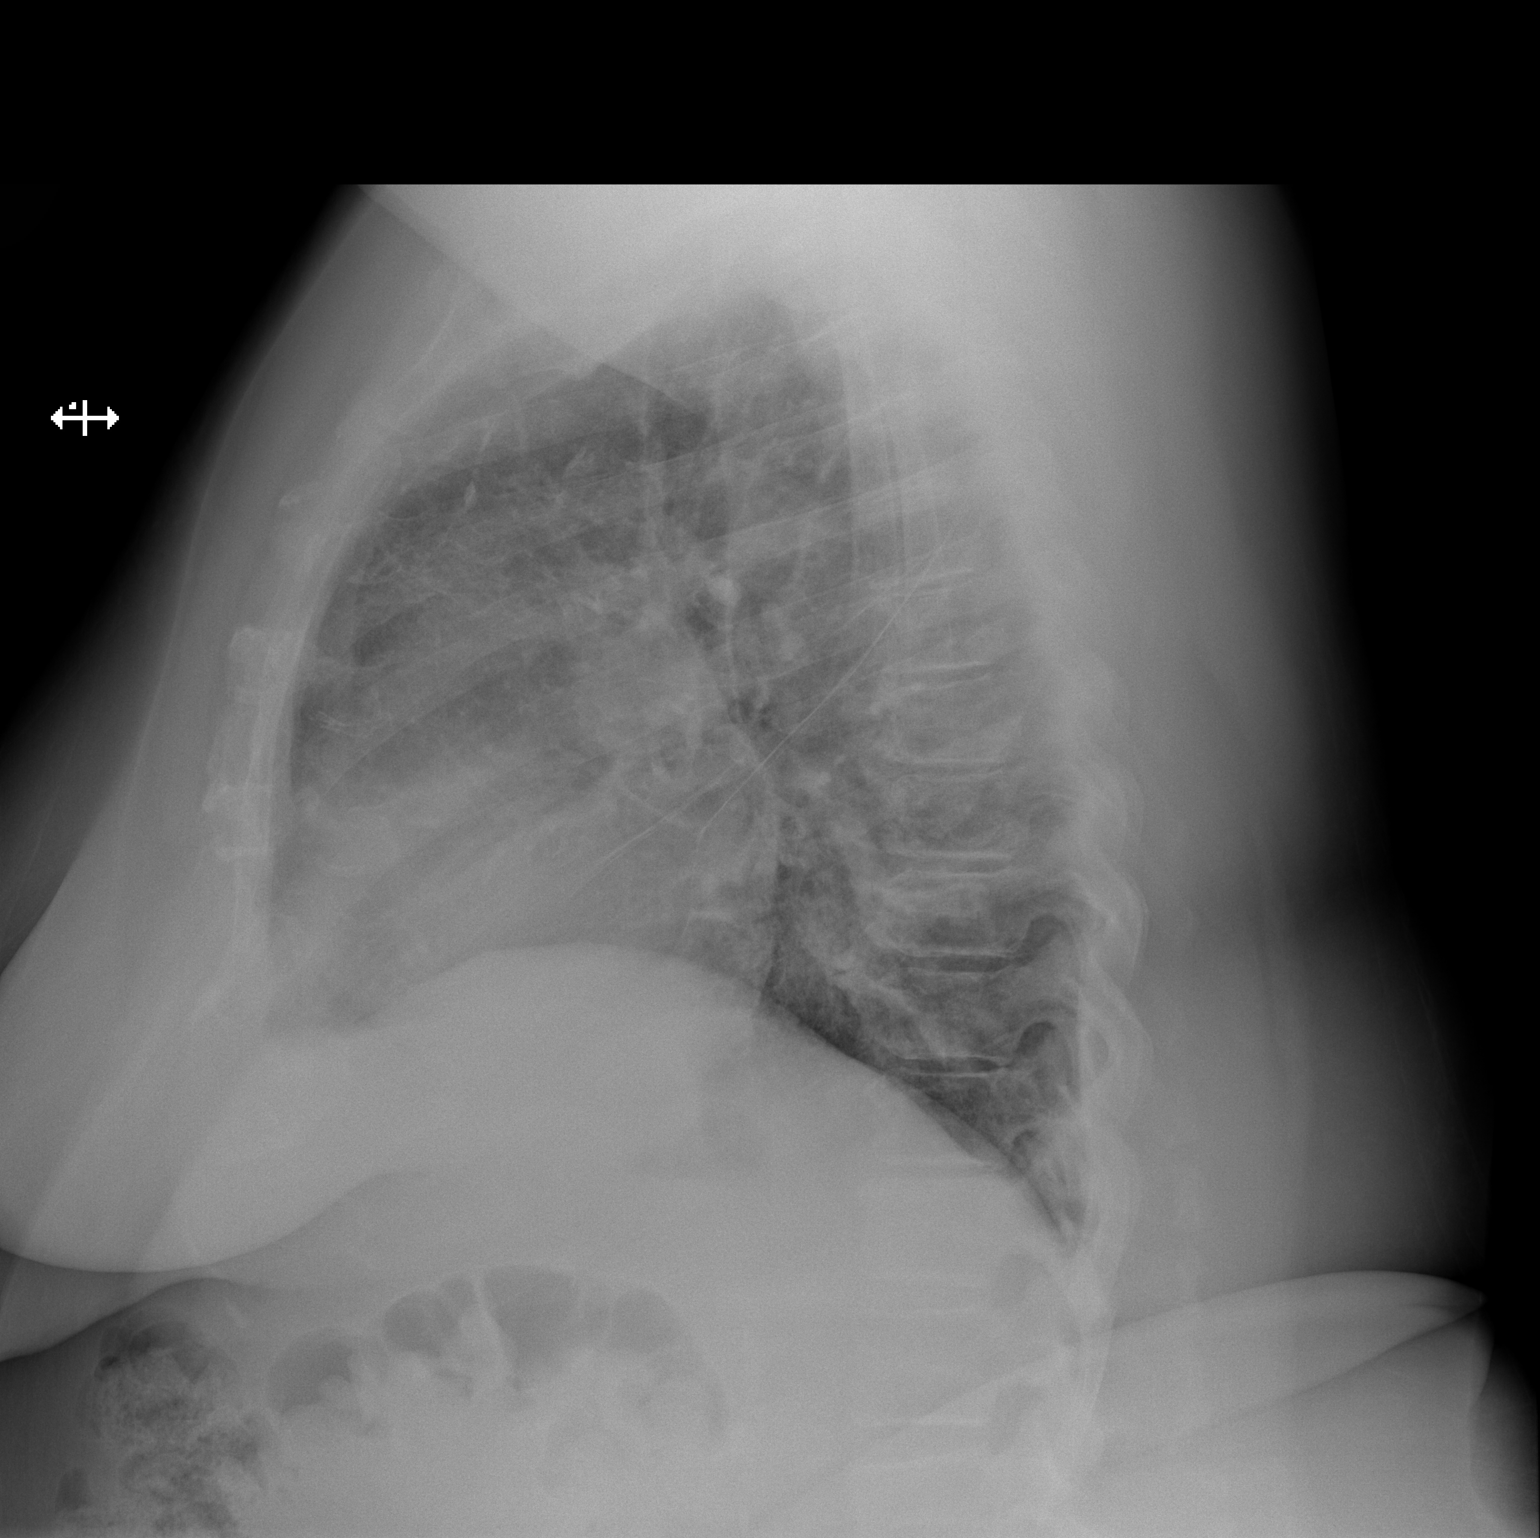

[2 of 2 positions shown; findings below may reference images not displayed]

FINDINGS: Stable cardiomediastinal silhouette. No pneumothorax or pleural
effusion is noted. Mild central pulmonary vascular congestion is
noted. No acute pulmonary disease is noted. Multilevel degenerative
disc disease is noted in the thoracic spine.
IMPRESSION: No active cardiopulmonary disease.

## 2016-04-25 NOTE — Therapy (Signed)
[place], [place], [address] Phone: [phone]   Fax:  [phone]  Physical Therapy Treatment  Patient Details  Name: Stacey Page MRN: [id number] Date of Birth: Jul 12, 1950 Referring Provider: Rodell Perna, MD  Encounter Date: 04/25/2016      PT End of Session - 04/25/16 1010    Visit Number 7   Number of Visits 24   Date for PT Re-Evaluation 06/10/16   Authorization Type Medicare Advantage   Authorization Time Period KX at visit 15   PT Start Time 1015   PT Stop Time 1100   PT Time Calculation (min) 45 min   Activity Tolerance Patient tolerated treatment well   Behavior During Therapy [facility] for tasks assessed/performed      Past Medical History:  Diagnosis Date  . Anxiety    doesn't take any meds  . Asthma   . CHF (congestive heart failure) (HCC)    takes Furosemide daily  . COPD (chronic obstructive pulmonary disease) (HCC)    Albuterol as needed  . Depression    doesn't take meds  . Diabetes ([place])    takes Januvia daily  . Eczema    uses cream as needed  . GERD (gastroesophageal reflux disease)   . History of bronchitis as a child   . HTN (hypertension)    takes Lisinopril and Labetalol daily  . Insomnia   . Joint pain   . Joint swelling   . OA (osteoarthritis)   . OSA (obstructive sleep apnea) 02/25/2014   wears CPAP at night  . Peripheral neuropathy (HCC)    takes Gabapentin as needed  . Pneumonia    hx of-2010  . Seasonal allergies    uses Flonase daily    Past Surgical History:  Procedure Laterality Date  . CESAREAN SECTION  x2  . COLONOSCOPY    . JOINT REPLACEMENT    . KNEE ARTHROSCOPY Right   . LUNG BIOPSY Right 06/10/2014   Procedure: LUNG BIOPSY;  Surgeon: Ivin Poot, MD;  Location: [place];  Service: Thoracic;  Laterality: Right;  . POLYPECTOMY     throat  . TOTAL KNEE ARTHROPLASTY Right 03/14/2016   Procedure: RIGHT TOTAL KNEE ARTHROPLASTY;  Surgeon: Marybelle Killings, MD;  Location: [place];  Service: Orthopedics;  Laterality: Right;  Marland Kitchen VIDEO ASSISTED THORACOSCOPY Right 06/10/2014   Procedure: VIDEO ASSISTED THORACOSCOPY;  Surgeon: Ivin Poot, MD;  Location: [place];  Service: Thoracic;  Laterality: Right;    There were no vitals filed for this visit.      Subjective Assessment - 04/25/16 1017    Subjective Doing well . Danced for 10 min this weekend.    Currently in Pain? No/denies            [facility] PT Assessment - 04/25/16 0001      AROM   Right Knee Flexion 105                     [facility] Adult PT Treatment/Exercise - 04/25/16 0001      Knee/Hip Exercises: Stretches   Gastroc Stretch 1 rep;Both;60 seconds   Other Knee/Hip Stretches seated knee extension with QS x 30 reps 5 sec     Knee/Hip Exercises: Aerobic   Nustep L4 6 min, Then for final exercise did 25 rpes RT/ LT at 4-6-8 resistance     Knee/Hip Exercises: Standing   Other Standing Knee Exercises Wall slides facing wall  12X,  RT and LT arm  then PF x 12 both legs.       Knee/Hip Exercises: Seated   Long Arc Quad Right   Long Arc Quad Weight 5 lbs.   Long [employer] Limitations RT, 30 reps  5 sec hold   Hamstring Limitations 25 reps green band     Knee/Hip Exercises: Supine   Quad Sets Limitations with knee extension for stretching   Heel Slides 15 reps   Bridges with [employer] Both;15 reps   Other Supine Knee/Hip Exercises Marching x 12 RT/LT      Manual Therapy   Manual therapy comments strap mobs for flexion,  tibia rotation for extension,  PROM knee                  PT Short Term Goals - 04/25/16 1011      PT SHORT TERM GOAL #1   Title she will be independent with inital HEP   Status Achieved     PT SHORT TERM GOAL #2   Title She will improve active RT knee flexion to 105 degrees   Baseline 105 degrees today post stretching   Status Achieved     PT SHORT TERM GOAL #3   Title She will walk full time with SPC in and out of home    Status Achieved     PT SHORT TERM GOAL #4   Title She will report pain decreased 30% or more generally during the day   Baseline more variable now   Status Partially Met     PT SHORT TERM GOAL #5   Title She will have active knee extension to -12 degrees to asssit with ambualtion   Baseline -18 degrees today   Status On-going           PT Long Term Goals - 04/04/16 1200      PT LONG TERM GOAL #1   Title She will be independent with all HEP issued   Time 12   Period Weeks   Status New     PT LONG TERM GOAL #2   Title She will walk with least resicted assistive device out of home nad no device in home   Time 12   Period Weeks   Status New     PT LONG TERM GOAL #3   Title She will have -5 degrees extension and 115 degrees of flexion to asssit with safe independnet ambulation   Time 12   Period Weeks   Status New     PT LONG TERM GOAL #4   Title She will be able to walk into store and do normal shopping withoug sitting due to pain   Time 12   Period Weeks   Status New     PT LONG TERM GOAL #5   Title She will be able to walk stairs step over step with one rail safely   Time 12   Period Weeks   Status New     PT LONG TERM GOAL #6   Title She will report intermittant pain   Time 12   Period Weeks   Status New     PT LONG TERM GOAL #7   Title She will report return to normal home tasks with 1-2 max pain   Time 12   Period Weeks   Status New     PT LONG TERM GOAL #8   Title She will improve hip strength to 4/5 to improve safe independence with  walking   Time 12   Period Weeks   Status New               Plan - 04/25/16 1100    Clinical Impression Statement Improved flexion . No incr papin . Tolerated all exercises but wiht pain on PROM flexion    PT Treatment/Interventions Cryotherapy;Electrical Stimulation;Stair training;Gait training;Passive range of motion;Patient/family education;Taping;Manual techniques;Therapeutic exercise;Therapeutic  activities   PT Next Visit Plan ROM and strengthening, , nustep, vaso , manual for ROM and swelling,  consider strap mobilization        FOTO next 1-2 sessions   PT Home Exercise Plan Hamstring curls,  stretches,    Consulted and Agree with Plan of Care Patient      Patient will benefit from skilled therapeutic intervention in order to improve the following deficits and impairments:  Decreased range of motion, Difficulty walking, Obesity, Pain, Decreased strength, Increased edema  Visit Diagnosis: Status post total right knee replacement  Stiffness of right knee, not elsewhere classified  Difficulty waking  Muscle weakness (generalized)  Localized edema     Problem List Patient Active Problem List   Diagnosis Date Noted  . Acute kidney injury ([place]) 03/17/2016  . CKD (chronic kidney disease) stage 3, GFR 30-59 ml/min 03/17/2016  . Symptomatic anemia 03/17/2016  . S/P total knee arthroplasty, right 03/17/2016  . Acute blood loss anemia   . Arthritis of right knee 03/14/2016  . Unilateral primary osteoarthritis, right knee   . Hyperlipidemia LDL goal <70 12/15/2015  . Paresthesia of both feet 11/19/2015  . Pain in both lower extremities 11/19/2015  . Numbness in both hands 08/13/2015  . Panic attack 06/09/2015  . Right knee pain 05/14/2015  . Chronic renal insufficiency 09/16/2014  . Pneumothorax on right   . Atypical mycobacterium infection   . ILD (interstitial lung disease) 2/2 MAI s/p med rxn   . Routine general medical examination at a health care facility 04/03/2014  . Morbid obesity ([place]) 04/03/2014  . OSA (obstructive sleep apnea) 02/25/2014  . Chronic diastolic heart failure ([place]) 02/25/2014  . Controlled steroid-induced diabetes mellitus ([place]) 02/25/2014  . Essential hypertension 02/25/2014    Darrel Hoover  PT 04/25/2016, 11:04 AM  [facility] [address] [place], [place], [address] Phone:  [phone]   Fax:  [phone]  Name: Annaly Skop MRN: [id number] Date of Birth: 1950-05-02

## 2016-04-27 ENCOUNTER — Ambulatory Visit: Payer: PPO | Admitting: Physical Therapy

## 2016-04-27 ENCOUNTER — Encounter: Payer: Self-pay | Admitting: Physical Therapy

## 2016-04-27 DIAGNOSIS — G478 Other sleep disorders: Secondary | ICD-10-CM

## 2016-04-27 DIAGNOSIS — Z96651 Presence of right artificial knee joint: Secondary | ICD-10-CM | POA: Diagnosis not present

## 2016-04-27 DIAGNOSIS — R600 Localized edema: Secondary | ICD-10-CM

## 2016-04-27 DIAGNOSIS — M25661 Stiffness of right knee, not elsewhere classified: Secondary | ICD-10-CM

## 2016-04-27 DIAGNOSIS — M6281 Muscle weakness (generalized): Secondary | ICD-10-CM

## 2016-04-27 NOTE — Therapy (Signed)
[place], [place], [address] Phone: [phone]   Fax:  [phone]  Physical Therapy Treatment  Patient Details  Name: Stacey Page MRN: [id number] Date of Birth: [birth year] Referring Provider: Rodell Perna, MD  Encounter Date: 04/27/2016      PT End of Session - 04/27/16 1256    Visit Number 8   Number of Visits 24   Date for PT Re-Evaluation 06/10/16   PT Start Time 0933   PT Stop Time 1017   PT Time Calculation (min) 44 min   Activity Tolerance Patient tolerated treatment well   Behavior During Therapy [facility] for tasks assessed/performed      Past Medical History:  Diagnosis Date  . Anxiety    doesn't take any meds  . Asthma   . CHF (congestive heart failure) (HCC)    takes Furosemide daily  . COPD (chronic obstructive pulmonary disease) (HCC)    Albuterol as needed  . Depression    doesn't take meds  . Diabetes ([place])    takes Januvia daily  . Eczema    uses cream as needed  . GERD (gastroesophageal reflux disease)   . History of bronchitis as a child   . HTN (hypertension)    takes Lisinopril and Labetalol daily  . Insomnia   . Joint pain   . Joint swelling   . OA (osteoarthritis)   . OSA (obstructive sleep apnea) 02/25/2014   wears CPAP at night  . Peripheral neuropathy (HCC)    takes Gabapentin as needed  . Pneumonia    hx of-2010  . Seasonal allergies    uses Flonase daily    Past Surgical History:  Procedure Laterality Date  . CESAREAN SECTION  x2  . COLONOSCOPY    . JOINT REPLACEMENT    . KNEE ARTHROSCOPY Right   . LUNG BIOPSY Right 06/10/2014   Procedure: LUNG BIOPSY;  Surgeon: Ivin Poot, MD;  Location: [place];  Service: Thoracic;  Laterality: Right;  . POLYPECTOMY     throat  . TOTAL KNEE ARTHROPLASTY Right 03/14/2016   Procedure: RIGHT TOTAL KNEE ARTHROPLASTY;  Surgeon: Marybelle Killings, MD;  Location: [place];  Service: Orthopedics;  Laterality: Right;  Marland Kitchen VIDEO  ASSISTED THORACOSCOPY Right 06/10/2014   Procedure: VIDEO ASSISTED THORACOSCOPY;  Surgeon: Ivin Poot, MD;  Location: [place];  Service: Thoracic;  Laterality: Right;    There were no vitals filed for this visit.      Subjective Assessment - 04/27/16 0944    Subjective No pain.  Last visit until she leaves for [place].  She will return April 10.  She will schedule 1 appointment with Richardson Landry prior to leaving.    Currently in Pain? No/denies   Pain Score 0-No pain                         [facility] Adult PT Treatment/Exercise - 04/27/16 0001      Ambulation/Gait   Pre-Gait Activities wall slides 2 sets 1 set with single leg.   Gait Comments on level cues  fatigues quickly     Knee/Hip Exercises: Aerobic   Nustep L4 8 minutes     Knee/Hip Exercises: Machines for Strengthening   Cybex Knee Extension 10 X  15 LBS up with 2 , hold 10 lower with one     Knee/Hip Exercises: Standing   Forward Step Up 20 reps;Step Height: 2";Step Height: 6"  Forward Step Up Limitations Stairs in PEDS gfym  extra time descending. Cues for softer landing.      Knee/Hip Exercises: Seated   Hamstring Limitations 20 green band     Modalities   Modalities --  Ice to go                   PT Short Term Goals - 04/25/16 1011      PT SHORT TERM GOAL #1   Title she will be independent with inital HEP   Status Achieved     PT SHORT TERM GOAL #2   Title She will improve active RT knee flexion to 105 degrees   Baseline 105 degrees today post stretching   Status Achieved     PT SHORT TERM GOAL #3   Title She will walk full time with SPC in and out of home   Status Achieved     PT SHORT TERM GOAL #4   Title She will report pain decreased 30% or more generally during the day   Baseline more variable now   Status Partially Met     PT SHORT TERM GOAL #5   Title She will have active knee extension to -12 degrees to asssit with ambualtion   Baseline -18 degrees today   Status  On-going           PT Long Term Goals - 04/04/16 1200      PT LONG TERM GOAL #1   Title She will be independent with all HEP issued   Time 12   Period Weeks   Status New     PT LONG TERM GOAL #2   Title She will walk with least resicted assistive device out of home nad no device in home   Time 12   Period Weeks   Status New     PT LONG TERM GOAL #3   Title She will have -5 degrees extension and 115 degrees of flexion to asssit with safe independnet ambulation   Time 12   Period Weeks   Status New     PT LONG TERM GOAL #4   Title She will be able to walk into store and do normal shopping withoug sitting due to pain   Time 12   Period Weeks   Status New     PT LONG TERM GOAL #5   Title She will be able to walk stairs step over step with one rail safely   Time 12   Period Weeks   Status New     PT LONG TERM GOAL #6   Title She will report intermittant pain   Time 12   Period Weeks   Status New     PT LONG TERM GOAL #7   Title She will report return to normal home tasks with 1-2 max pain   Time 12   Period Weeks   Status New     PT LONG TERM GOAL #8   Title She will improve hip strength to 4/5 to improve safe independence with walking   Time 12   Period Weeks   Status New               Plan - 04/27/16 1256    Clinical Impression Statement No FOTO in system, could not do.  Patient is leaving for a few  weeks.  She is to schedule a follow up appointment with [facility] PT prior to leaving.   Focus on endurance and  strengthening today.   No new goals met .  No pain at end of session.   PT Next Visit Plan See hoW she did on Vacation.  Measure   PT Home Exercise Plan Hamstring curls,  stretches,    Consulted and Agree with Plan of Care Patient      Patient will benefit from skilled therapeutic intervention in order to improve the following deficits and impairments:  Decreased range of motion, Difficulty walking, Obesity, Pain, Decreased strength,  Increased edema  Visit Diagnosis: Status post total right knee replacement  Stiffness of right knee, not elsewhere classified  Difficulty waking  Muscle weakness (generalized)  Localized edema     Problem List Patient Active Problem List   Diagnosis Date Noted  . Acute kidney injury ([place]) 03/17/2016  . CKD (chronic kidney disease) stage 3, GFR 30-59 ml/min 03/17/2016  . Symptomatic anemia 03/17/2016  . S/P total knee arthroplasty, right 03/17/2016  . Acute blood loss anemia   . Arthritis of right knee 03/14/2016  . Unilateral primary osteoarthritis, right knee   . Hyperlipidemia LDL goal <70 12/15/2015  . Paresthesia of both feet 11/19/2015  . Pain in both lower extremities 11/19/2015  . Numbness in both hands 08/13/2015  . Panic attack 06/09/2015  . Right knee pain 05/14/2015  . Chronic renal insufficiency 09/16/2014  . Pneumothorax on right   . Atypical mycobacterium infection   . ILD (interstitial lung disease) 2/2 MAI s/p med rxn   . Routine general medical examination at a health care facility 04/03/2014  . Morbid obesity ([place]) 04/03/2014  . OSA (obstructive sleep apnea) 02/25/2014  . Chronic diastolic heart failure ([place]) 02/25/2014  . Controlled steroid-induced diabetes mellitus ([place]) 02/25/2014  . Essential hypertension 02/25/2014    HARRIS,KAREN PTA 04/27/2016, 1:03 PM  [facility] [address] [place], [place], [address] Phone: [phone]   Fax:  [phone]  Name: Caoilainn Sacks MRN: [id number] Date of Birth: [birth year]

## 2016-04-30 IMAGING — CR DG CHEST 1V PORT
1 series · 1 of 1 positions shown · non-contrast
Comparison: 06/10/2014.

CLINICAL DATA: Chest tube .

EXAM:
PORTABLE CHEST - 1 VIEW

[AP]
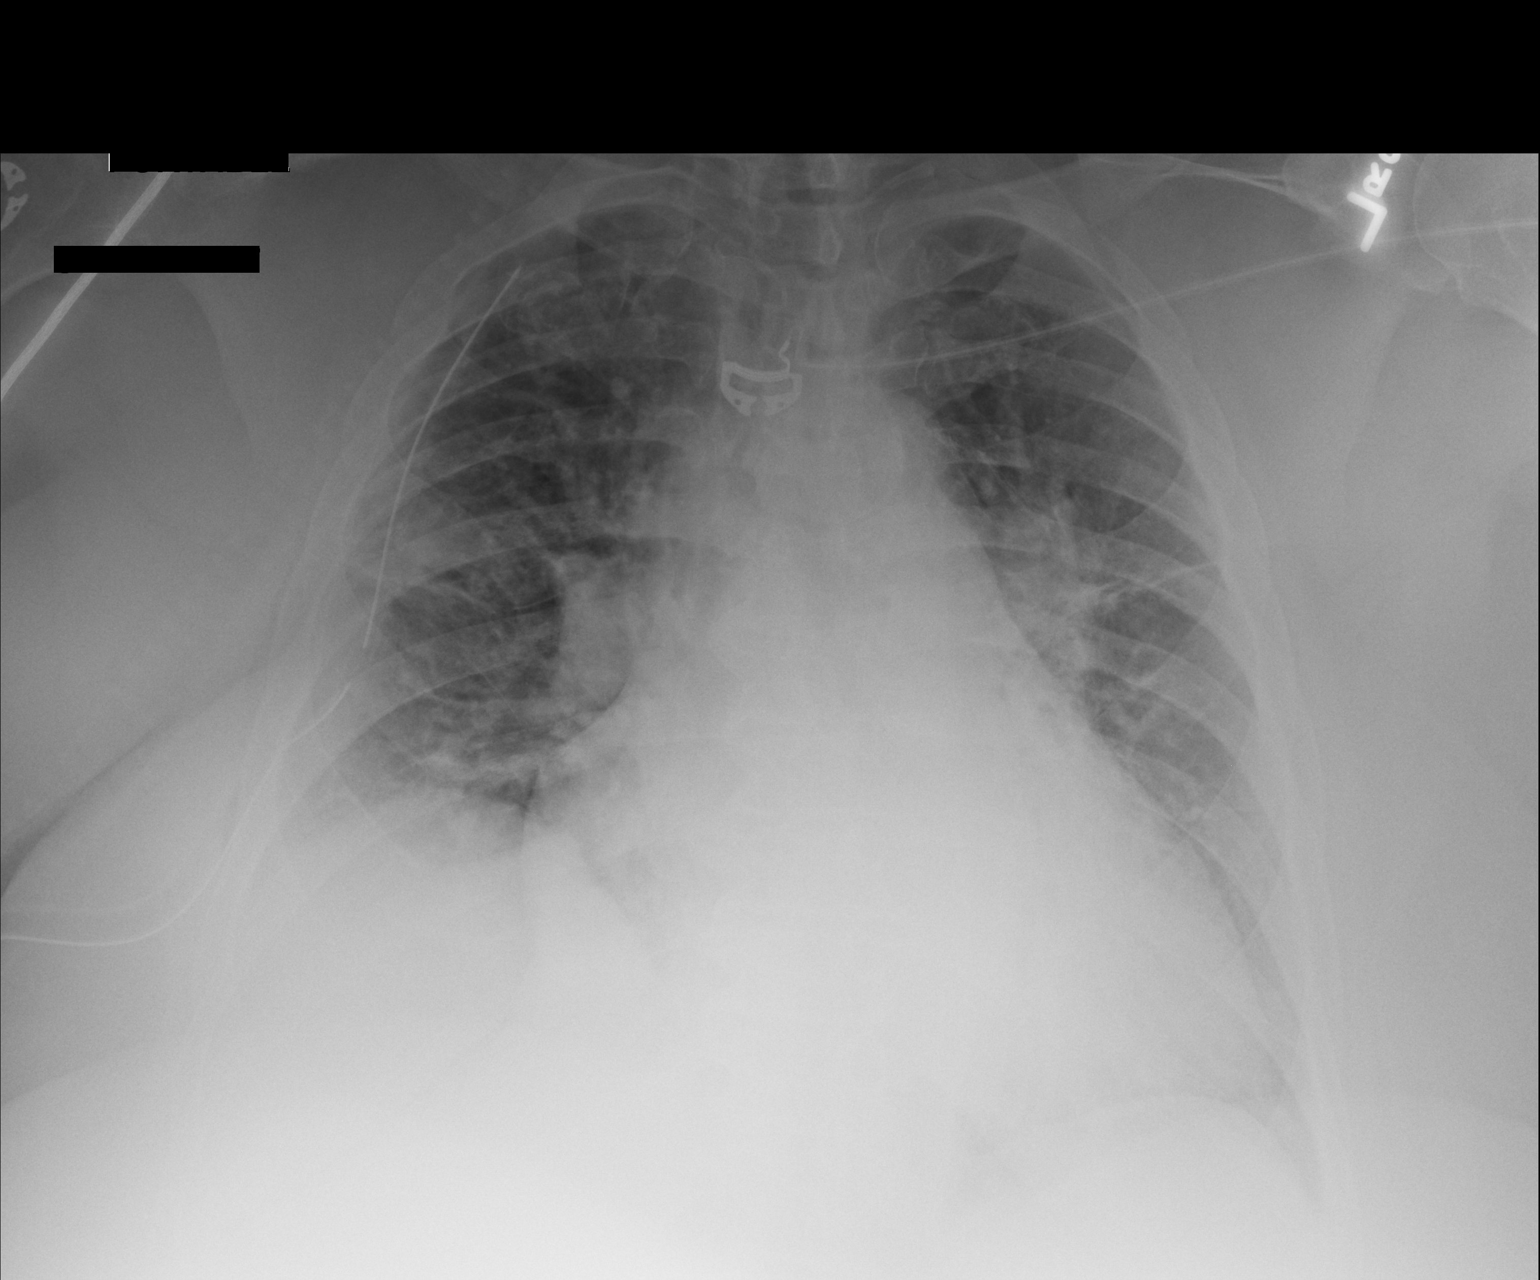

[1 of 1 positions shown; findings below may reference images not displayed]

FINDINGS: Interim extubation. Right chest tube in stable position. Persistent
severe cardiomegaly. Low lung volumes with bilateral mid lung and
bibasilar atelectasis. Small right pleural effusion cannot be
excluded. No pneumothorax .
IMPRESSION: 1. Interim extubation.
2. Severe cardiomegaly.  No prominent pulmonary venous congestion.
3. Persistent bilateral mid lung and bibasilar subsegmental
atelectasis. Small right pleural effusion cannot be excluded.

## 2016-05-02 IMAGING — DX DG CHEST 2V
2 series · 2 of 2 positions shown · non-contrast
Comparison: 06/12/2014

CLINICAL DATA: Pulmonary fibrosis

EXAM:
CHEST  2 VIEW

[chest pa]
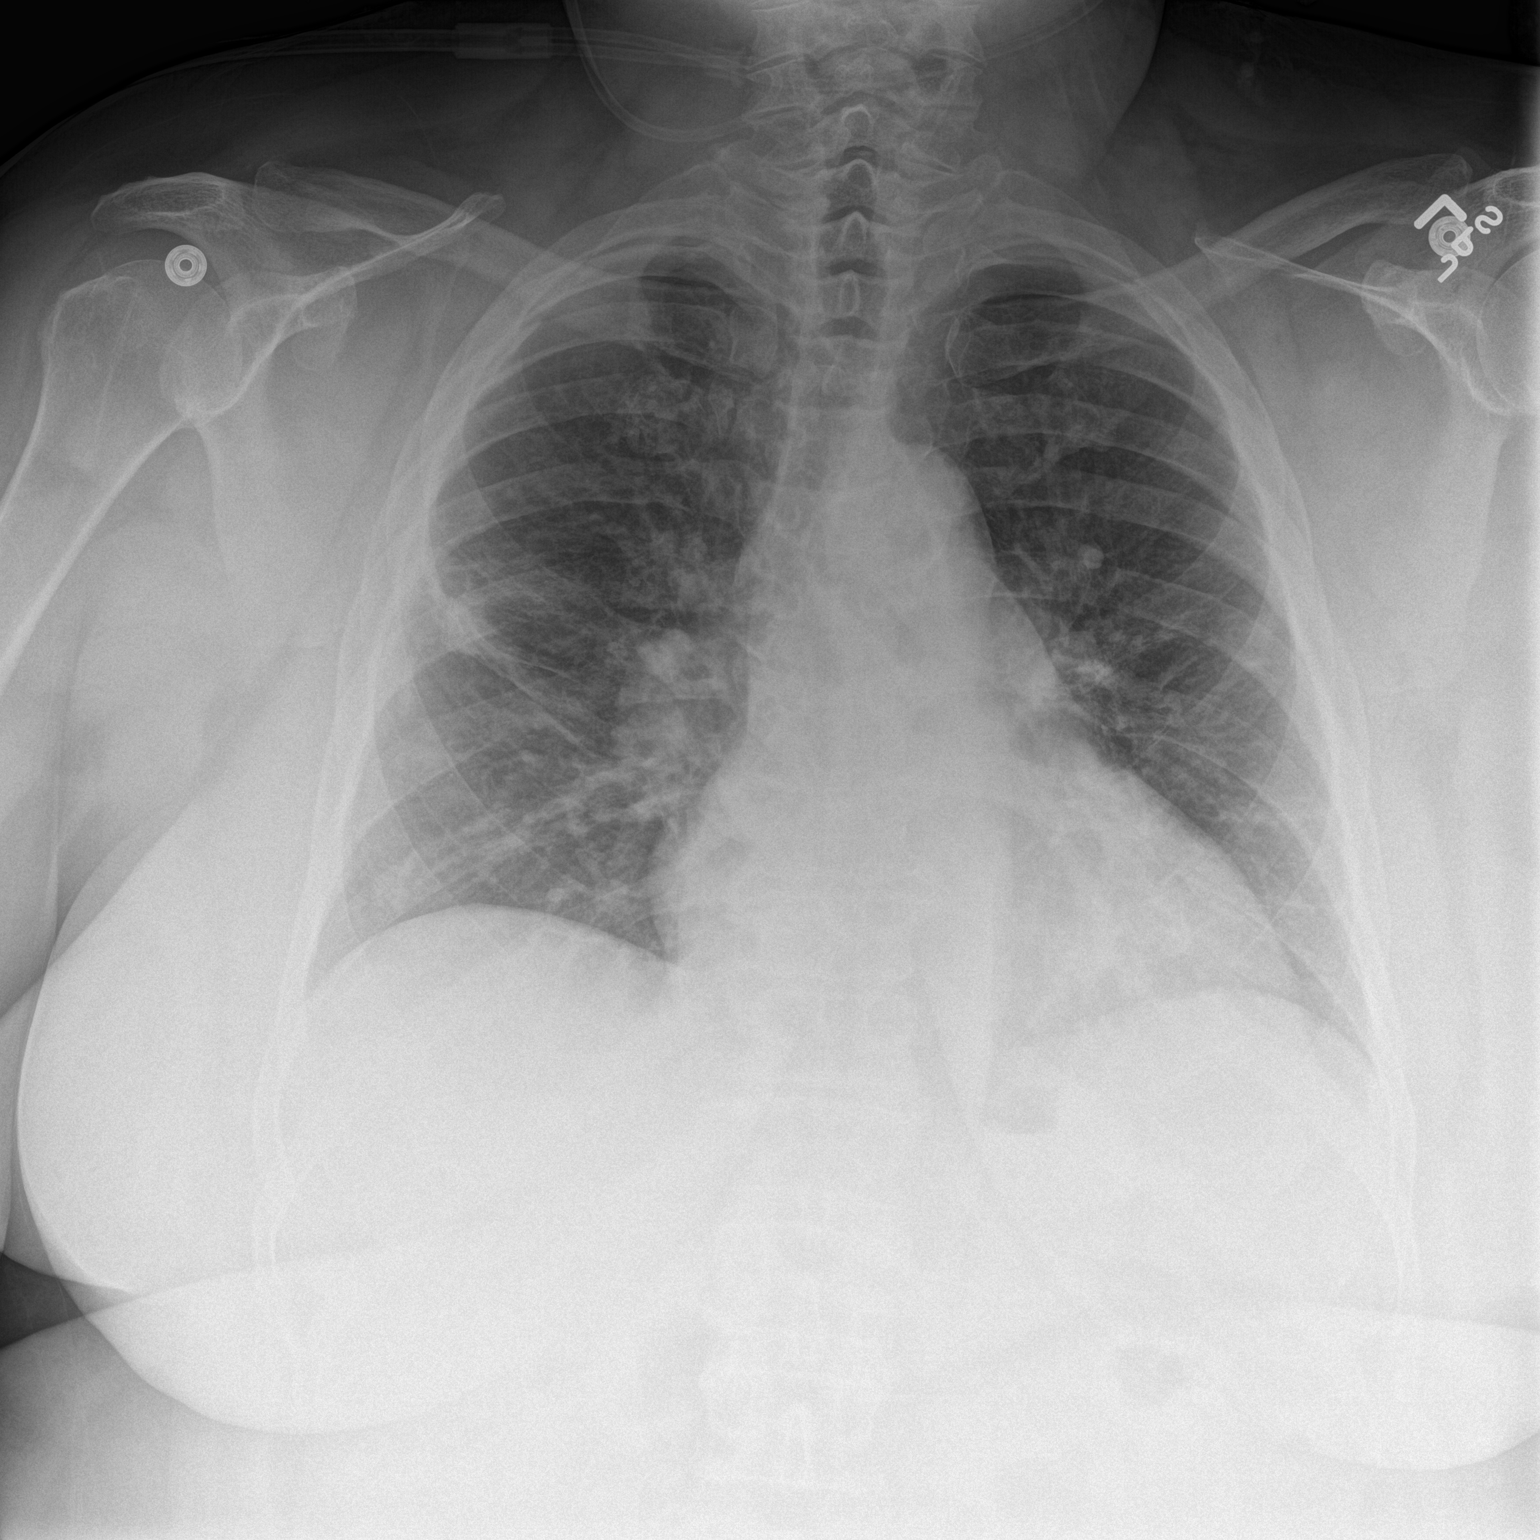

[chest lat]
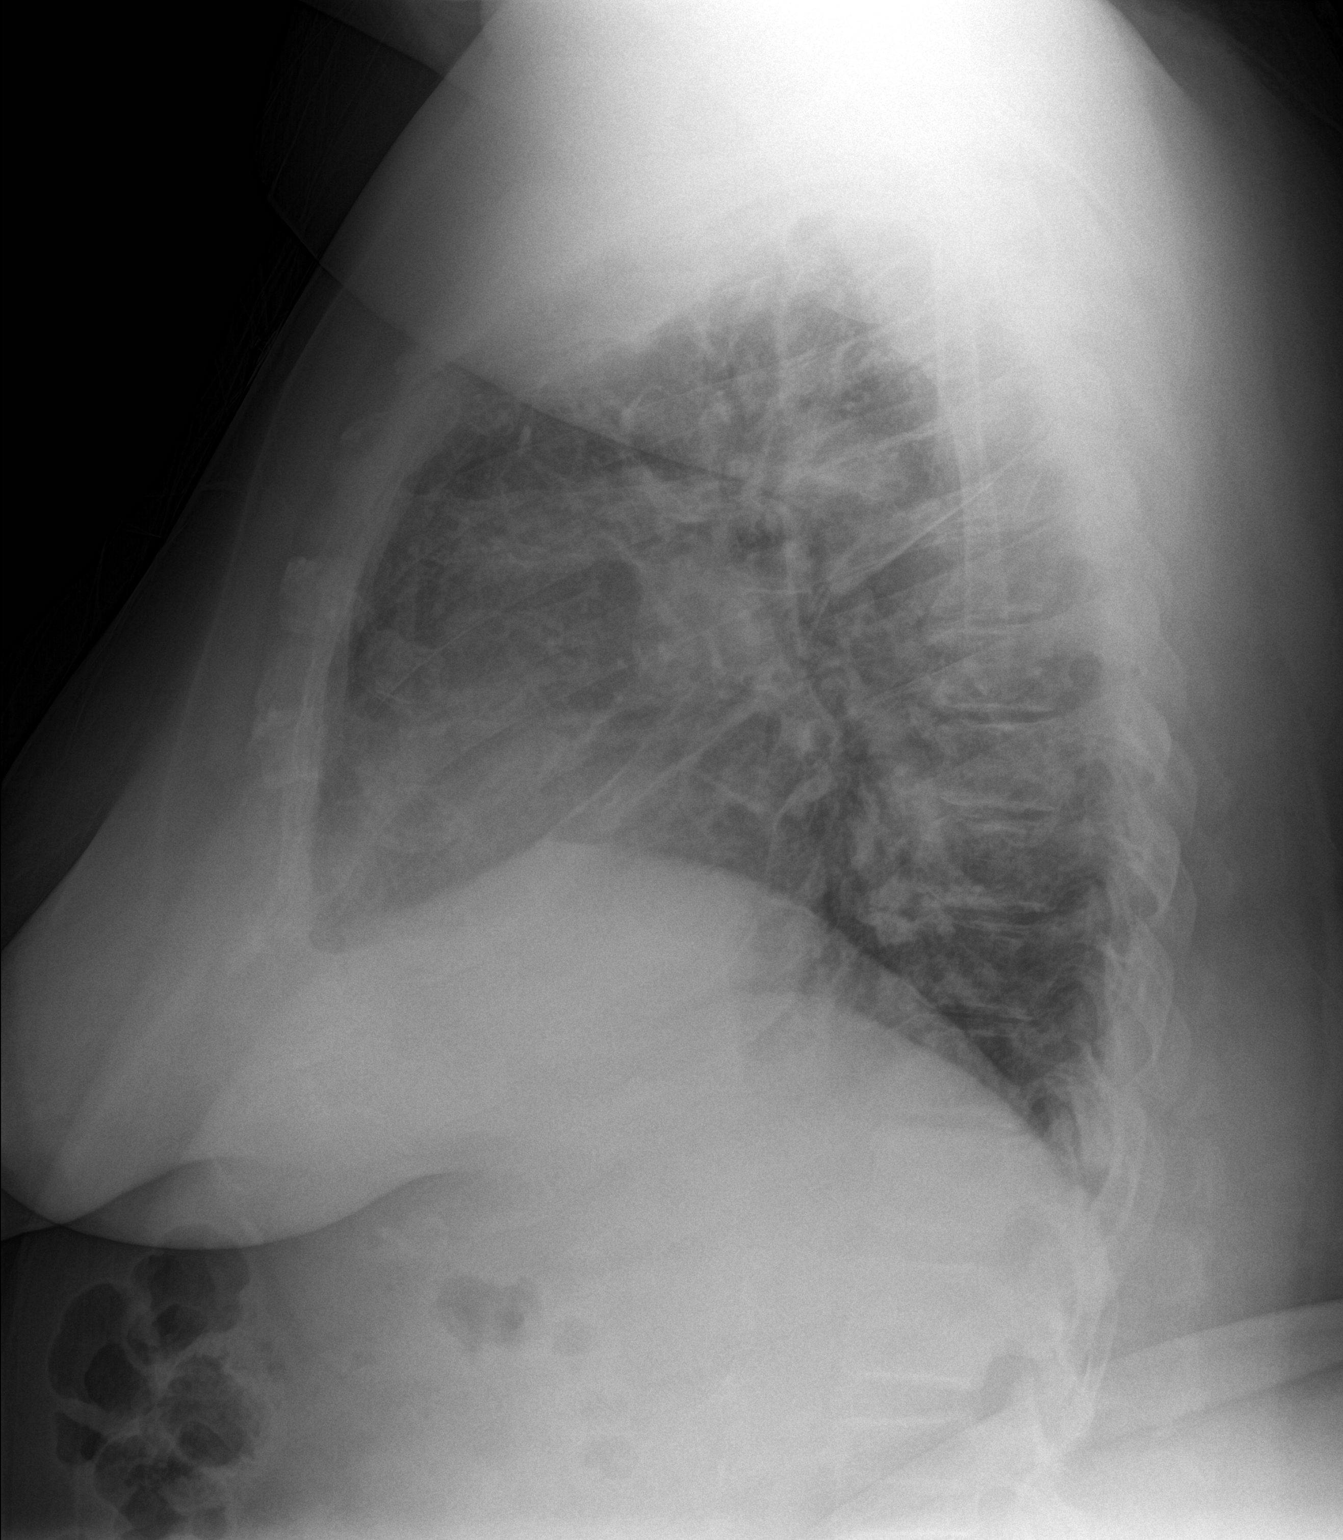

[2 of 2 positions shown; findings below may reference images not displayed]

FINDINGS: The right chest tube has been removed. There is no pneumothorax.
Mild linear opacities are present bilaterally, improved with partial
clearance of the basilar airspace opacities compare to the prior
study.
IMPRESSION: Right chest tube removal.  No pneumothorax.  Improved.

## 2016-05-03 ENCOUNTER — Ambulatory Visit (INDEPENDENT_AMBULATORY_CARE_PROVIDER_SITE_OTHER): Payer: PPO | Admitting: Orthopaedic Surgery

## 2016-05-03 ENCOUNTER — Encounter (INDEPENDENT_AMBULATORY_CARE_PROVIDER_SITE_OTHER): Payer: Self-pay | Admitting: Orthopaedic Surgery

## 2016-05-03 VITALS — Ht 68.0 in | Wt 270.0 lb

## 2016-05-03 DIAGNOSIS — Z96651 Presence of right artificial knee joint: Secondary | ICD-10-CM

## 2016-05-03 NOTE — Progress Notes (Signed)
Post-Op Visit Note   Patient: Stacey Page           Date of Birth: February 06, 1951           MRN: [id number] Visit Date: 05/03/2016 PCP: Wilfred Lacy, NP   Assessment & Plan: Right total knee arthroplasty with excellent extension good quad strength, she still working some on more flexion.  Chief Complaint:  Chief Complaint  Patient presents with  . Right Knee - Routine Post Op   Visit Diagnoses:  1. S/P total knee arthroplasty, right     Plan: Patient flexion 102 shows full extension graded quad strength. She is going to help family with some financial problems in [place]. I've asked her to work on just getting a little bit more flexion otherwise I'm very happy with her efforts. Opposite knee gives her some problems but she wants to wait for any treatment for this for a number of months and I'll check her back again as needed.  Follow-Up Instructions: Return if symptoms worsen or fail to improve.   Orders:  No orders of the defined types were placed in this encounter.  No orders of the defined types were placed in this encounter.  HPI Patient returns for 5 week follow up. She is status post right total knee arthroplasty on 03/14/2016. She is 7 weeks 1 day post op. She is doing well. She has continued physical therapy and states this has helped a lot. She is leaving today to go to [place] but states that she will resume therapy when she gets back. She is taking Oxycodone and Robaxin with relief. She requests refills of both.    Imaging: No results found.  PMFS History: Patient Active Problem List   Diagnosis Date Noted  . Acute kidney injury ([place]) 03/17/2016  . CKD (chronic kidney disease) stage 3, GFR 30-59 ml/min 03/17/2016  . Symptomatic anemia 03/17/2016  . S/P total knee arthroplasty, right 03/17/2016  . Acute blood loss anemia   . Arthritis of right knee 03/14/2016  . Unilateral primary osteoarthritis, right knee   . Hyperlipidemia LDL goal <70 12/15/2015  .  Paresthesia of both feet 11/19/2015  . Pain in both lower extremities 11/19/2015  . Numbness in both hands 08/13/2015  . Panic attack 06/09/2015  . Right knee pain 05/14/2015  . Chronic renal insufficiency 09/16/2014  . Pneumothorax on right   . Atypical mycobacterium infection   . ILD (interstitial lung disease) 2/2 MAI s/p med rxn   . Routine general medical examination at a health care facility 04/03/2014  . Morbid obesity ([place]) 04/03/2014  . OSA (obstructive sleep apnea) 02/25/2014  . Chronic diastolic heart failure ([place]) 02/25/2014  . Controlled steroid-induced diabetes mellitus ([place]) 02/25/2014  . Essential hypertension 02/25/2014   Past Medical History:  Diagnosis Date  . Anxiety    doesn't take any meds  . Asthma   . CHF (congestive heart failure) (HCC)    takes Furosemide daily  . COPD (chronic obstructive pulmonary disease) (HCC)    Albuterol as needed  . Depression    doesn't take meds  . Diabetes ([place])    takes Januvia daily  . Eczema    uses cream as needed  . GERD (gastroesophageal reflux disease)   . History of bronchitis as a child   . HTN (hypertension)    takes Lisinopril and Labetalol daily  . Insomnia   . Joint pain   . Joint swelling   . OA (osteoarthritis)   .  OSA (obstructive sleep apnea) 02/25/2014   wears CPAP at night  . Peripheral neuropathy (HCC)    takes Gabapentin as needed  . Pneumonia    hx of-2010  . Seasonal allergies    uses Flonase daily    Family History  Problem Relation Age of Onset  . COPD Father   . Hypothyroidism Father   . Anemia Father     iron deficiency  . Cancer Mother 29    pancreatic    Past Surgical History:  Procedure Laterality Date  . CESAREAN SECTION  x2  . COLONOSCOPY    . JOINT REPLACEMENT    . KNEE ARTHROSCOPY Right   . LUNG BIOPSY Right 06/10/2014   Procedure: LUNG BIOPSY;  Surgeon: Ivin Poot, MD;  Location: [place];  Service: Thoracic;  Laterality: Right;  . POLYPECTOMY     throat  . TOTAL  KNEE ARTHROPLASTY Right 03/14/2016   Procedure: RIGHT TOTAL KNEE ARTHROPLASTY;  Surgeon: Marybelle Killings, MD;  Location: [place];  Service: Orthopedics;  Laterality: Right;  Marland Kitchen VIDEO ASSISTED THORACOSCOPY Right 06/10/2014   Procedure: VIDEO ASSISTED THORACOSCOPY;  Surgeon: Ivin Poot, MD;  Location: [place];  Service: Thoracic;  Laterality: Right;   Social History   Occupational History  . disabled    Social History Main Topics  . Smoking status: Former Smoker    Packs/day: 0.25    Years: 15.00    Types: Cigarettes    Quit date: 02/08/1980  . Smokeless tobacco: Former Systems developer     Comment: 2 packs per week  . Alcohol use 0.0 oz/week     Comment: occassional/social/rare  . Drug use: No  . Sexual activity: No

## 2016-05-07 IMAGING — CR DG CHEST 1V PORT
1 series · 1 of 1 positions shown · non-contrast
Comparison: 06/17/2014

CLINICAL DATA: Right-sided pneumothorax

EXAM:
PORTABLE CHEST - 1 VIEW

[AP]
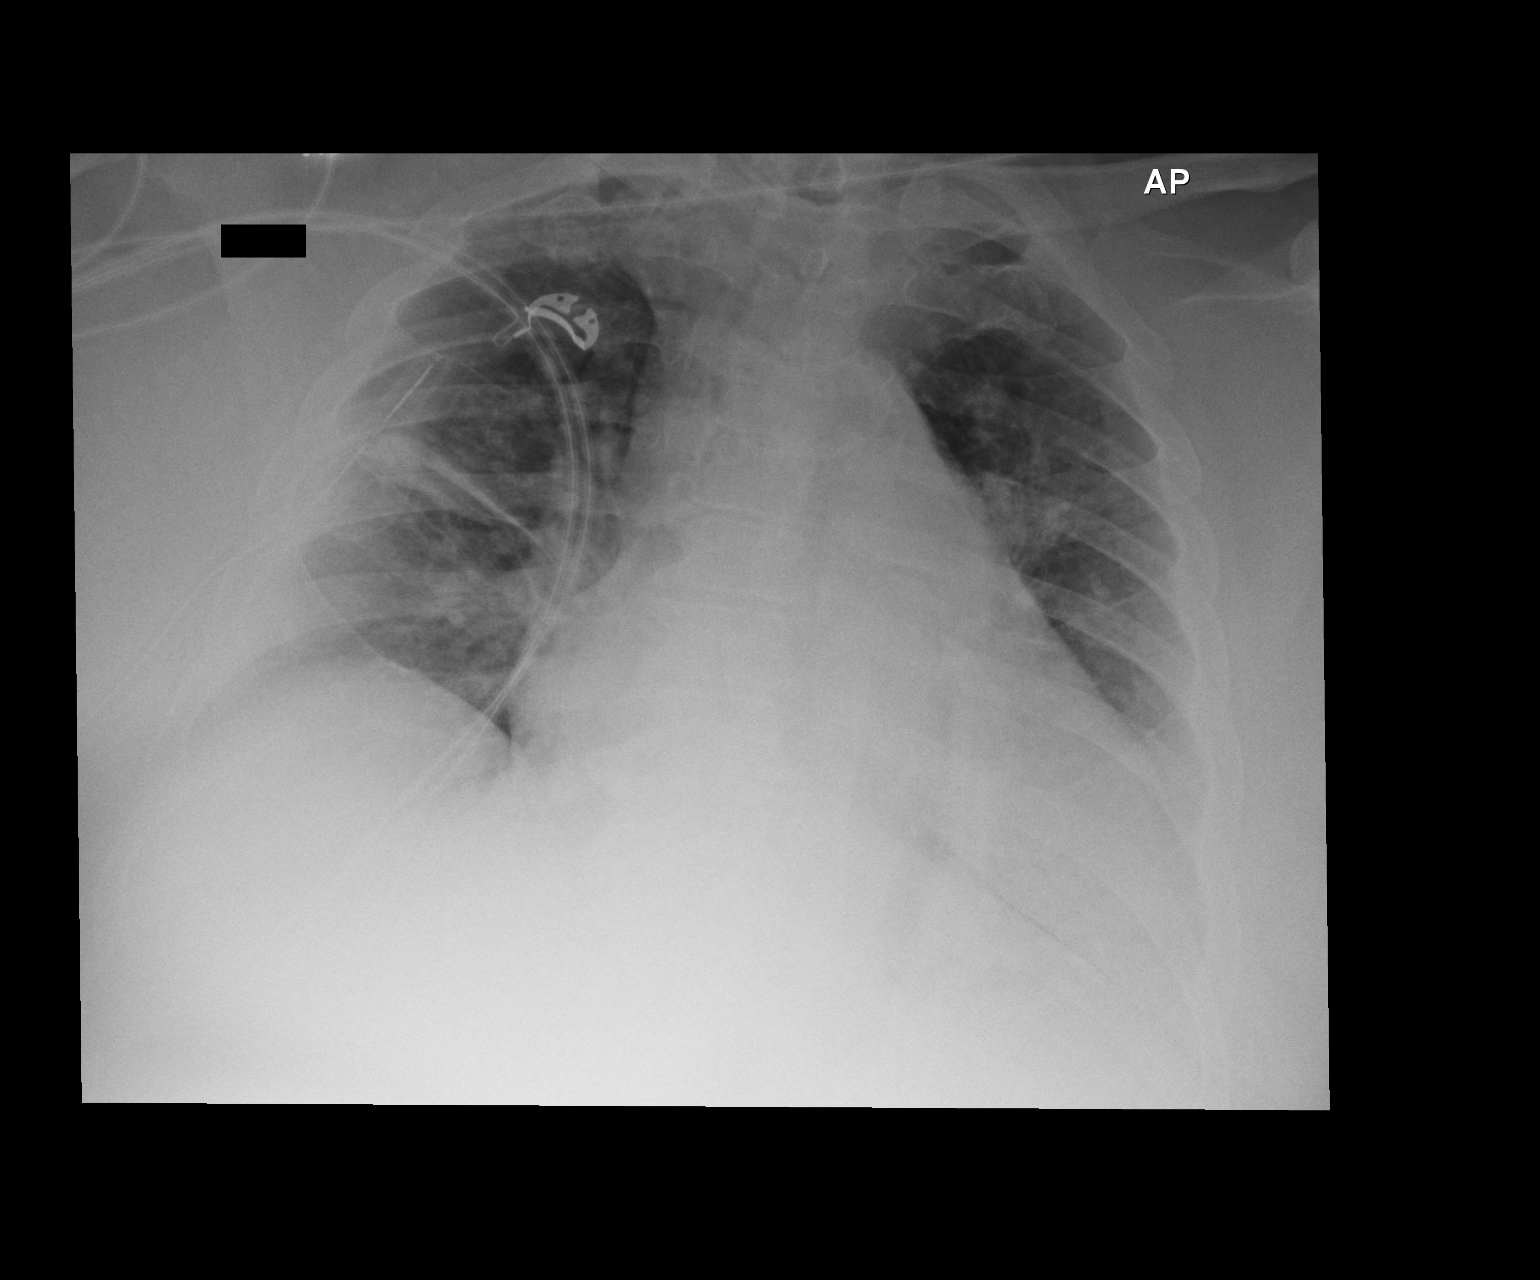

[1 of 1 positions shown; findings below may reference images not displayed]

FINDINGS: Cardiac shadow remains enlarged. The lungs are hyperinflated
bilaterally. Thickening of the minor fissure is again noted on the
right. No pneumothorax is seen. A right-sided chest tube remains in
place.
IMPRESSION: Persistent thickening along the minor fissure. No acute abnormality
is noted. No pneumothorax is seen.

## 2016-05-18 LAB — HM DIABETES EYE EXAM

## 2016-05-19 ENCOUNTER — Ambulatory Visit: Payer: PPO | Attending: Orthopaedic Surgery

## 2016-05-19 DIAGNOSIS — G478 Other sleep disorders: Secondary | ICD-10-CM | POA: Insufficient documentation

## 2016-05-19 DIAGNOSIS — M6281 Muscle weakness (generalized): Secondary | ICD-10-CM

## 2016-05-19 DIAGNOSIS — R600 Localized edema: Secondary | ICD-10-CM | POA: Diagnosis not present

## 2016-05-19 DIAGNOSIS — M25661 Stiffness of right knee, not elsewhere classified: Secondary | ICD-10-CM

## 2016-05-19 DIAGNOSIS — Z96651 Presence of right artificial knee joint: Secondary | ICD-10-CM | POA: Diagnosis not present

## 2016-05-19 DIAGNOSIS — H52223 Regular astigmatism, bilateral: Secondary | ICD-10-CM | POA: Diagnosis not present

## 2016-05-19 DIAGNOSIS — H524 Presbyopia: Secondary | ICD-10-CM | POA: Diagnosis not present

## 2016-05-19 NOTE — Therapy (Signed)
[place], [place], [address] Phone: [phone]   Fax:  [phone]  Physical Therapy Treatment/ Discharge  Patient Details  Name: Stacey Page MRN: [id number] Date of Birth: February 08, 1951 Referring Provider: Rodell Perna, MD  Encounter Date: 05/19/2016      PT End of Session - 05/19/16 0929    Visit Number 9   Number of Visits 24   Date for PT Re-Evaluation 06/10/16   Authorization Type Medicare Advantage   Authorization Time Period KX at visit 15   PT Start Time 0930   PT Stop Time 0955   PT Time Calculation (min) 25 min   Activity Tolerance Patient tolerated treatment well   Behavior During Therapy [facility] for tasks assessed/performed      Past Medical History:  Diagnosis Date  . Anxiety    doesn't take any meds  . Asthma   . CHF (congestive heart failure) (HCC)    takes Furosemide daily  . COPD (chronic obstructive pulmonary disease) (HCC)    Albuterol as needed  . Depression    doesn't take meds  . Diabetes ([place])    takes Januvia daily  . Eczema    uses cream as needed  . GERD (gastroesophageal reflux disease)   . History of bronchitis as a child   . HTN (hypertension)    takes Lisinopril and Labetalol daily  . Insomnia   . Joint pain   . Joint swelling   . OA (osteoarthritis)   . OSA (obstructive sleep apnea) 02/25/2014   wears CPAP at night  . Peripheral neuropathy (HCC)    takes Gabapentin as needed  . Pneumonia    hx of-2010  . Seasonal allergies    uses Flonase daily    Past Surgical History:  Procedure Laterality Date  . CESAREAN SECTION  x2  . COLONOSCOPY    . JOINT REPLACEMENT    . KNEE ARTHROSCOPY Right   . LUNG BIOPSY Right 06/10/2014   Procedure: LUNG BIOPSY;  Surgeon: Ivin Poot, MD;  Location: [place];  Service: Thoracic;  Laterality: Right;  . POLYPECTOMY     throat  . TOTAL KNEE ARTHROPLASTY Right 03/14/2016   Procedure: RIGHT TOTAL KNEE ARTHROPLASTY;   Surgeon: Marybelle Killings, MD;  Location: [place];  Service: Orthopedics;  Laterality: Right;  Marland Kitchen VIDEO ASSISTED THORACOSCOPY Right 06/10/2014   Procedure: VIDEO ASSISTED THORACOSCOPY;  Surgeon: Ivin Poot, MD;  Location: [place];  Service: Thoracic;  Laterality: Right;    There were no vitals filed for this visit.      Subjective Assessment - 05/19/16 0934    Subjective No pain today . No problems since she was away.   Has some medial knee pain at times.   MD released her. She is ready for discharge.    Currently in Pain? No/denies            [facility] PT Assessment - 05/19/16 0001      AROM   Right Knee Extension 0   Right Knee Flexion 103     Strength   Overall Strength Comments RT hip abduction and extension 4/5   Right Knee Flexion 5/5   Right Knee Extension 5/5     Ambulation/Gait   Assistive device None   Gait Pattern Within Functional Limits   Ambulation Surface Level;Indoor   Gait Comments She reports now able to walk in store and does not need cart.  [place] Adult PT Treatment/Exercise - 05/19/16 0001      Knee/Hip Exercises: Stretches   Other Knee/Hip Stretches FLexion stretching in supine with gravity as stretching force. 6 min in 30 sec bouts , standing with foot on steps, and sitting with slight stretching to be done with light  tension.                 PT Education - 05/19/16 0956    Education provided Yes   Education Details variety of RT knee flexion stretcing for home.    Person(s) Educated Patient   Methods Explanation;Verbal cues;Handout;Tactile cues;Demonstration   Comprehension Returned demonstration;Verbalized understanding          PT Short Term Goals - 05/19/16 0958      PT SHORT TERM GOAL #1   Title she will be independent with inital HEP   Status Achieved     PT SHORT TERM GOAL #2   Title She will improve active RT knee flexion to 105 degrees   Baseline 100 pre stretch   Status Partially Met      PT SHORT TERM GOAL #3   Title She will walk full time with SPC in and out of home   Baseline No device   Status Achieved     PT SHORT TERM GOAL #4   Title She will report pain decreased 30% or more generally during the day   Status Achieved     PT SHORT TERM GOAL #5   Title She will have active knee extension to -12 degrees to asssit with ambualtion   Baseline 0 today   Status Achieved           PT Long Term Goals - 05/19/16 0959      PT LONG TERM GOAL #1   Title She will be independent with all HEP issued   Status Achieved     PT LONG TERM GOAL #2   Title She will walk with least resicted assistive device out of home nad no device in home   Status Achieved     PT LONG TERM GOAL #3   Title She will have -5 degrees extension and 115 degrees of flexion to asssit with safe independnet ambulation   Baseline 0 degrees extension ,, 100 degrees flexion   Status Partially Met     PT LONG TERM GOAL #4   Title She will be able to walk into store and do normal shopping withoug sitting due to pain   Status Achieved     PT LONG TERM GOAL #5   Title She will be able to walk stairs step over step with one rail safely   Baseline step by step for safety   Status Not Met     PT LONG TERM GOAL #6   Title She will report intermittant pain   Status Achieved     PT LONG TERM GOAL #7   Title She will report return to normal home tasks with 1-2 max pain   Status Achieved     PT LONG TERM GOAL #8   Title She will improve hip strength to 4/5 to improve safe independence with walking   Status Achieved               Plan - 05/19/16 0930    Clinical Impression Statement Pt returns from trip to [place] . doing well and stated she was ready for discharge. The only limitations functionally are with steps as she does them step by  step rather than step over for safety.   If she can improve flexion RT knee she may be able to do this in future.  We revieed options for stretching to  incorporate into dily activity.    .    PT Treatment/Interventions Cryotherapy;Electrical Stimulation;Stair training;Gait training;Passive range of motion;Patient/family education;Taping;Manual techniques;Therapeutic exercise;Therapeutic activities   PT Next Visit Plan Discharge with HEP   PT Home Exercise Plan Hamstring curls,  stretches,    Consulted and Agree with Plan of Care Patient      Patient will benefit from skilled therapeutic intervention in order to improve the following deficits and impairments:  Decreased range of motion, Difficulty walking, Obesity, Pain, Decreased strength, Increased edema  Visit Diagnosis: Status post total right knee replacement  Stiffness of right knee, not elsewhere classified  Difficulty waking  Muscle weakness (generalized)  Localized edema       G-Codes - 06-09-2016 0955    Functional Assessment Tool Used (Outpatient Only) clinical judgement   Functional Limitation Mobility: Walking and moving around   Mobility: Walking and Moving Around Current Status [phone]) At least 20 percent but less than 40 percent impaired, limited or restricted   Mobility: Walking and Moving Around Goal Status [phone]) At least 20 percent but less than 40 percent impaired, limited or restricted   Mobility: Walking and Moving Around Discharge Status [phone]) At least 20 percent but less than 40 percent impaired, limited or restricted      Problem List Patient Active Problem List   Diagnosis Date Noted  . Acute kidney injury ([place]) 03/17/2016  . CKD (chronic kidney disease) stage 3, GFR 30-59 ml/min 03/17/2016  . Symptomatic anemia 03/17/2016  . S/P total knee arthroplasty, right 03/17/2016  . Acute blood loss anemia   . Arthritis of right knee 03/14/2016  . Unilateral primary osteoarthritis, right knee   . Hyperlipidemia LDL goal <70 12/15/2015  . Paresthesia of both feet 11/19/2015  . Pain in both lower extremities 11/19/2015  . Numbness in both hands  08/13/2015  . Panic attack 06/09/2015  . Right knee pain 05/14/2015  . Chronic renal insufficiency 09/16/2014  . Pneumothorax on right   . Atypical mycobacterium infection   . ILD (interstitial lung disease) 2/2 MAI s/p med rxn   . Routine general medical examination at a health care facility 04/03/2014  . Morbid obesity ([place]) 04/03/2014  . OSA (obstructive sleep apnea) 02/25/2014  . Chronic diastolic heart failure ([place]) 02/25/2014  . Controlled steroid-induced diabetes mellitus ([place]) 02/25/2014  . Essential hypertension 02/25/2014    Darrel Hoover  PT 06-09-2016, 10:01 AM  [facility] [address] [place], [place], [address] Phone: [phone]   Fax:  [phone]  Name: Stacey Page MRN: [id number] Date of Birth: 1950-08-29  PHYSICAL THERAPY DISCHARGE SUMMARY  Visits from Start of Care: 9  Current functional level related to goals / functional outcomes: See above  Remaining deficits: See above. Increased in Knee flexion would benefit pt. In future so hopefully she will keep up with stretching   Education / Equipment: HEP for strength  And ROM  Plan: Patient agrees to discharge.  Patient goals were partially met. Patient is being discharged due to the patient's request.  ?????   And independence with functional activity

## 2016-05-21 ENCOUNTER — Telehealth: Payer: Self-pay | Admitting: Internal Medicine

## 2016-05-21 DIAGNOSIS — I10 Essential (primary) hypertension: Secondary | ICD-10-CM

## 2016-05-23 NOTE — Telephone Encounter (Signed)
Patent got 3 months supply on 04/11/2016, should not need refill, I already refused refill request

## 2016-05-23 NOTE — Telephone Encounter (Addendum)
Pt needs refill of furosemide (LASIX) 40 MG tablet  Please resend to [facility] on [address]

## 2016-05-24 DIAGNOSIS — G4733 Obstructive sleep apnea (adult) (pediatric): Secondary | ICD-10-CM | POA: Diagnosis not present

## 2016-06-07 ENCOUNTER — Ambulatory Visit: Payer: PPO | Admitting: Podiatry

## 2016-06-16 DIAGNOSIS — G4733 Obstructive sleep apnea (adult) (pediatric): Secondary | ICD-10-CM | POA: Diagnosis not present

## 2016-07-15 ENCOUNTER — Ambulatory Visit (INDEPENDENT_AMBULATORY_CARE_PROVIDER_SITE_OTHER): Payer: PPO | Admitting: Nurse Practitioner

## 2016-07-15 ENCOUNTER — Telehealth: Payer: Self-pay | Admitting: Nurse Practitioner

## 2016-07-15 ENCOUNTER — Encounter: Payer: Self-pay | Admitting: Nurse Practitioner

## 2016-07-15 ENCOUNTER — Other Ambulatory Visit (INDEPENDENT_AMBULATORY_CARE_PROVIDER_SITE_OTHER): Payer: PPO

## 2016-07-15 VITALS — BP 160/70 | HR 56 | Temp 97.7°F | Ht 68.0 in | Wt 297.0 lb

## 2016-07-15 DIAGNOSIS — E099 Drug or chemical induced diabetes mellitus without complications: Secondary | ICD-10-CM | POA: Diagnosis not present

## 2016-07-15 DIAGNOSIS — N183 Chronic kidney disease, stage 3 unspecified: Secondary | ICD-10-CM

## 2016-07-15 DIAGNOSIS — E785 Hyperlipidemia, unspecified: Secondary | ICD-10-CM | POA: Diagnosis not present

## 2016-07-15 DIAGNOSIS — T380X5A Adverse effect of glucocorticoids and synthetic analogues, initial encounter: Secondary | ICD-10-CM | POA: Diagnosis not present

## 2016-07-15 DIAGNOSIS — I10 Essential (primary) hypertension: Secondary | ICD-10-CM

## 2016-07-15 DIAGNOSIS — D62 Acute posthemorrhagic anemia: Secondary | ICD-10-CM

## 2016-07-15 DIAGNOSIS — I5032 Chronic diastolic (congestive) heart failure: Secondary | ICD-10-CM | POA: Diagnosis not present

## 2016-07-15 LAB — LIPID PANEL
Cholesterol: 154 mg/dL (ref 0–200)
HDL: 60 mg/dL (ref >39.00)
LDL Cholesterol: 80 mg/dL (ref 0–99)
NonHDL: 93.86
Total CHOL/HDL Ratio: 3
Triglycerides: 67 mg/dL (ref 0.0–149.0)
VLDL: 13.4 mg/dL (ref 0.0–40.0)

## 2016-07-15 LAB — BASIC METABOLIC PANEL
BUN: 23 mg/dL (ref 6–23)
CALCIUM: 10.1 mg/dL (ref 8.4–10.5)
CO2: 29 meq/L (ref 19–32)
Chloride: 106 mEq/L (ref 96–112)
Creatinine, Ser: 1.37 mg/dL — ABNORMAL HIGH (ref 0.40–1.20)
GFR: 49.82 mL/min — AB (ref >60.00)
GLUCOSE: 92 mg/dL (ref 70–99)
Potassium: 4.6 mEq/L (ref 3.5–5.1)
Sodium: 140 mEq/L (ref 135–145)

## 2016-07-15 LAB — CBC
HEMATOCRIT: 38.9 % (ref 36.0–46.0)
HEMOGLOBIN: 12.9 g/dL (ref 12.0–15.0)
MCHC: 33.1 g/dL (ref 30.0–36.0)
MCV: 89.8 fl (ref 78.0–100.0)
PLATELETS: 210 10*3/uL (ref 150.0–400.0)
RBC: 4.34 Mil/uL (ref 3.87–5.11)
RDW: 15.9 % — ABNORMAL HIGH (ref 11.5–15.5)
WBC: 5 10*3/uL (ref 4.0–10.5)

## 2016-07-15 MED ORDER — LISINOPRIL 20 MG PO TABS: 20.0000 mg | ORAL_TABLET | Freq: Every day | ORAL | 1 refills | Status: DC

## 2016-07-15 MED ORDER — FUROSEMIDE 20 MG PO TABS: 20.0000 mg | ORAL_TABLET | Freq: Every day | ORAL | 1 refills | Status: DC

## 2016-07-15 MED ORDER — POTASSIUM CITRATE ER 10 MEQ (1080 MG) PO TBCR: 10.0000 meq | EXTENDED_RELEASE_TABLET | Freq: Every day | ORAL | 1 refills | Status: DC

## 2016-07-15 MED ORDER — SITAGLIPTIN PHOSPHATE 100 MG PO TABS: 100.0000 mg | ORAL_TABLET | Freq: Every day | ORAL | 1 refills | Status: DC

## 2016-07-15 MED ORDER — ASPIRIN EC 81 MG PO TBEC: 81.0000 mg | DELAYED_RELEASE_TABLET | Freq: Every day | ORAL | 0 refills | Status: AC

## 2016-07-15 NOTE — Telephone Encounter (Signed)
Patient called back.  Gave [place]'s response on labs.

## 2016-07-15 NOTE — Patient Instructions (Signed)
Exercising to Lose Weight Exercising can help you to lose weight. In order to lose weight through exercise, you need to do vigorous-intensity exercise. You can tell that you are exercising with vigorous intensity if you are breathing very hard and fast and cannot hold a conversation while exercising. Moderate-intensity exercise helps to maintain your current weight. You can tell that you are exercising at a moderate level if you have a higher heart rate and faster breathing, but you are still able to hold a conversation. How often should I exercise? Choose an activity that you enjoy and set realistic goals. Your health care provider can help you to make an activity plan that works for you. Exercise regularly as directed by your health care provider. This may include:  Doing resistance training twice each week, such as: ? Push-ups. ? Sit-ups. ? Lifting weights. ? Using resistance bands.  Doing a given intensity of exercise for a given amount of time. Choose from these options: ? 150 minutes of moderate-intensity exercise every week. ? 75 minutes of vigorous-intensity exercise every week. ? A mix of moderate-intensity and vigorous-intensity exercise every week.  Children, pregnant women, people who are out of shape, people who are overweight, and older adults may need to consult a health care provider for individual recommendations. If you have any sort of medical condition, be sure to consult your health care provider before starting a new exercise program. What are some activities that can help me to lose weight?  Walking at a rate of at least 4.5 miles an hour.  Jogging or running at a rate of 5 miles per hour.  Biking at a rate of at least 10 miles per hour.  Lap swimming.  Roller-skating or in-line skating.  Cross-country skiing.  Vigorous competitive sports, such as football, basketball, and soccer.  Jumping rope.  Aerobic dancing. How can I be more active in my day-to-day  activities?  Use the stairs instead of the elevator.  Take a walk during your lunch break.  If you drive, park your car farther away from work or school.  If you take public transportation, get off one stop early and walk the rest of the way.  Make all of your phone calls while standing up and walking around.  Get up, stretch, and walk around every 30 minutes throughout the day. What guidelines should I follow while exercising?  Do not exercise so much that you hurt yourself, feel dizzy, or get very short of breath.  Consult your health care provider prior to starting a new exercise program.  Wear comfortable clothes and shoes with good support.  Drink plenty of water while you exercise to prevent dehydration or heat stroke. Body water is lost during exercise and must be replaced.  Work out until you breathe faster and your heart beats faster. This information is not intended to replace advice given to you by your health care provider. Make sure you discuss any questions you have with your health care provider. Document Released: 02/26/2010 Document Revised: 07/02/2015 Document Reviewed: 06/27/2013 Elsevier Interactive Patient Education  2018 [employer] Calories are units of energy. Your body needs a certain amount of calories from food to keep you going throughout the day. When you eat more calories than your body needs, your body stores the extra calories as fat. When you eat fewer calories than your body needs, your body burns fat to get the energy it needs. Calorie counting means keeping track  of how many calories you eat and drink each day. Calorie counting can be helpful if you need to lose weight. If you make sure to eat fewer calories than your body needs, you should lose weight. Ask your health care provider what a healthy weight is for you. For calorie counting to work, you will need to eat the right number of calories in a day in order  to lose a healthy amount of weight per week. A dietitian can help you determine how many calories you need in a day and will give you suggestions on how to reach your calorie goal.  A healthy amount of weight to lose per week is usually 1-2 lb (0.5-0.9 kg). This usually means that your daily calorie intake should be reduced by 500-750 calories.  Eating 1,200 - 1,500 calories per day can help most women lose weight.  Eating 1,500 - 1,800 calories per day can help most men lose weight.  What is my plan? My goal is to have __________ calories per day. If I have this many calories per day, I should lose around __________ pounds per week. What do I need to know about calorie counting? In order to meet your daily calorie goal, you will need to:  Find out how many calories are in each food you would like to eat. Try to do this before you eat.  Decide how much of the food you plan to eat.  Write down what you ate and how many calories it had. Doing this is called keeping a food log.  To successfully lose weight, it is important to balance calorie counting with a healthy lifestyle that includes regular activity. Aim for 150 minutes of moderate exercise (such as walking) or 75 minutes of vigorous exercise (such as running) each week. Where do I find calorie information?  The number of calories in a food can be found on a Nutrition Facts label. If a food does not have a Nutrition Facts label, try to look up the calories online or ask your dietitian for help. Remember that calories are listed per serving. If you choose to have more than one serving of a food, you will have to multiply the calories per serving by the amount of servings you plan to eat. For example, the label on a package of bread might say that a serving size is 1 slice and that there are 90 calories in a serving. If you eat 1 slice, you will have eaten 90 calories. If you eat 2 slices, you will have eaten 180 calories. How do I keep a  food log? Immediately after each meal, record the following information in your food log:  What you ate. Don't forget to include toppings, sauces, and other extras on the food.  How much you ate. This can be measured in cups, ounces, or number of items.  How many calories each food and drink had.  The total number of calories in the meal.  Keep your food log near you, such as in a small notebook in your pocket, or use a mobile app or website. Some programs will calculate calories for you and show you how many calories you have left for the day to meet your goal. What are some calorie counting tips?  Use your calories on foods and drinks that will fill you up and not leave you hungry: ? Some examples of foods that fill you up are nuts and nut butters, vegetables, lean proteins, and high-fiber foods like  whole grains. High-fiber foods are foods with more than 5 g fiber per serving. ? Drinks such as sodas, specialty coffee drinks, alcohol, and juices have a lot of calories, yet do not fill you up.  Eat nutritious foods and avoid empty calories. Empty calories are calories you get from foods or beverages that do not have many vitamins or protein, such as candy, sweets, and soda. It is better to have a nutritious high-calorie food (such as an avocado) than a food with few nutrients (such as a bag of chips).  Know how many calories are in the foods you eat most often. This will help you calculate calorie counts faster.  Pay attention to calories in drinks. Low-calorie drinks include water and unsweetened drinks.  Pay attention to nutrition labels for "low fat" or "fat free" foods. These foods sometimes have the same amount of calories or more calories than the full fat versions. They also often have added sugar, starch, or salt, to make up for flavor that was removed with the fat.  Find a way of tracking calories that works for you. Get creative. Try different apps or programs if writing down  calories does not work for you. What are some portion control tips?  Know how many calories are in a serving. This will help you know how many servings of a certain food you can have.  Use a measuring cup to measure serving sizes. You could also try weighing out portions on a kitchen scale. With time, you will be able to estimate serving sizes for some foods.  Take some time to put servings of different foods on your favorite plates, bowls, and cups so you know what a serving looks like.  Try not to eat straight from a bag or box. Doing this can lead to overeating. Put the amount you would like to eat in a cup or on a plate to make sure you are eating the right portion.  Use smaller plates, glasses, and bowls to prevent overeating.  Try not to multitask (for example, watch TV or use your computer) while eating. If it is time to eat, sit down at a table and enjoy your food. This will help you to know when you are full. It will also help you to be aware of what you are eating and how much you are eating. What are tips for following this plan? Reading food labels  Check the calorie count compared to the serving size. The serving size may be smaller than what you are used to eating.  Check the source of the calories. Make sure the food you are eating is high in vitamins and protein and low in saturated and trans fats. Shopping  Read nutrition labels while you shop. This will help you make healthy decisions before you decide to purchase your food.  Make a grocery list and stick to it. Cooking  Try to cook your favorite foods in a healthier way. For example, try baking instead of frying.  Use low-fat dairy products. Meal planning  Use more fruits and vegetables. Half of your plate should be fruits and vegetables.  Include lean proteins like poultry and fish. How do I count calories when eating out?  Ask for smaller portion sizes.  Consider sharing an entree and sides instead of  getting your own entree.  If you get your own entree, eat only half. Ask for a box at the beginning of your meal and put the rest of your entree in  it so you are not tempted to eat it.  If calories are listed on the menu, choose the lower calorie options.  Choose dishes that include vegetables, fruits, whole grains, low-fat dairy products, and lean protein.  Choose items that are boiled, broiled, grilled, or steamed. Stay away from items that are buttered, battered, fried, or served with cream sauce. Items labeled "crispy" are usually fried, unless stated otherwise.  Choose water, low-fat milk, unsweetened iced tea, or other drinks without added sugar. If you want an alcoholic beverage, choose a lower calorie option such as a glass of wine or light beer.  Ask for dressings, sauces, and syrups on the side. These are usually high in calories, so you should limit the amount you eat.  If you want a salad, choose a garden salad and ask for grilled meats. Avoid extra toppings like bacon, cheese, or fried items. Ask for the dressing on the side, or ask for olive oil and vinegar or lemon to use as dressing.  Estimate how many servings of a food you are given. For example, a serving of cooked rice is  cup or about the size of half a baseball. Knowing serving sizes will help you be aware of how much food you are eating at restaurants. The list below tells you how big or small some common portion sizes are based on everyday objects: ? 1 oz-4 stacked dice. ? 3 oz-1 deck of cards. ? 1 tsp-1 die. ? 1 Tbsp- a ping-pong ball. ? 2 Tbsp-1 ping-pong ball. ?  cup- baseball. ? 1 cup-1 baseball. Summary  Calorie counting means keeping track of how many calories you eat and drink each day. If you eat fewer calories than your body needs, you should lose weight.  A healthy amount of weight to lose per week is usually 1-2 lb (0.5-0.9 kg). This usually means reducing your daily calorie intake by 500-750  calories.  The number of calories in a food can be found on a Nutrition Facts label. If a food does not have a Nutrition Facts label, try to look up the calories online or ask your dietitian for help.  Use your calories on foods and drinks that will fill you up, and not on foods and drinks that will leave you hungry.  Use smaller plates, glasses, and bowls to prevent overeating. This information is not intended to replace advice given to you by your health care provider. Make sure you discuss any questions you have with your health care provider. Document Released: 01/24/2005 Document Revised: 12/25/2015 Document Reviewed: 12/25/2015 Elsevier Interactive Patient Education  2017 [employer].

## 2016-07-15 NOTE — Progress Notes (Signed)
Subjective:  Patient ID: Stacey Page, female    DOB: 16-Nov-1950  Age: 66 y.o. MRN: [id number]  CC: Follow-up (3 mo fu--didnt not take her med--rushing to get here--iron pill made her feel dizzy but she is okey now)   HPI   HTN: Did not take medications this morning. Does not maintain low salt diet, nor does she exercise.  DM: Does not check glucose at home. Still taking [place] as prescribed.  Anemia: Has occassional light headedness, no syncope. Taking iron supplement as prescribed. No constipation.  Anxiety: Persistent. Not willing to take any maintenance medication at this time.  Outpatient Medications Prior to Visit  Medication Sig Dispense Refill  . albuterol (PROAIR HFA) 108 (90 Base) MCG/ACT inhaler Inhale 2 puffs into the lungs every 6 (six) hours as needed for wheezing or shortness of breath. 1 Inhaler 3  . atorvastatin (LIPITOR) 20 MG tablet Take 1 tablet (20 mg total) by mouth daily at 6 PM. 90 tablet 1  . Clobetasol Prop Emollient Base (CLOBETASOL PROPIONATE E) 0.05 % emollient cream Apply 1 application topically daily as needed (skin irritations). 30 g 2  . fluticasone (FLONASE) 50 MCG/ACT nasal spray PLACE 1 SPRAY INTO BOTH NOSTRILS DAILY. 16 g 2  . labetalol (NORMODYNE) 200 MG tablet Take 1 tablet (200 mg total) by mouth 2 (two) times daily. 180 tablet 1  . montelukast (SINGULAIR) 10 MG tablet TAKE 1 TABLET (10 MG TOTAL) BY MOUTH AT BEDTIME. 90 tablet 1  . ALPRAZolam (XANAX) 0.25 MG tablet Take 0.25 mg by mouth every 12 (twelve) hours as needed.  0  . aspirin EC 325 MG EC tablet Take 1 tablet (325 mg total) by mouth daily with breakfast. 30 tablet 0  . furosemide (LASIX) 40 MG tablet TAKE 1 TABLET BY MOUTH EVERY DAY 90 tablet 0  . lisinopril (PRINIVIL,ZESTRIL) 20 MG tablet Take 1 tablet (20 mg total) by mouth daily. 90 tablet 1  . potassium citrate (UROCIT-K) 10 MEQ (1080 MG) SR tablet Take 1 tablet (10 mEq total) by mouth daily. 90 tablet 1  . sitaGLIPtin  (JANUVIA) 100 MG tablet Take 1 tablet (100 mg total) by mouth daily. 30 tablet 3  . gabapentin (NEURONTIN) 300 MG capsule     . methocarbamol (ROBAXIN) 500 MG tablet Take 1 tablet (500 mg total) by mouth every 8 (eight) hours as needed for muscle spasms. (Patient not taking: Reported on 07/15/2016) 50 tablet 0  . oxyCODONE-acetaminophen (PERCOCET/ROXICET) 5-325 MG tablet Take 1 tablet by mouth every 8 (eight) hours as needed for severe pain. (Patient not taking: Reported on 07/15/2016) 50 tablet 0   No facility-administered medications prior to visit.     ROS Review of Systems  Constitutional: Negative for fever, malaise/fatigue and weight loss.  Cardiovascular: Positive for leg swelling. Negative for palpitations, orthopnea and PND.  Gastrointestinal: Negative.   Genitourinary: Negative.   Musculoskeletal: Negative.   Skin: Negative.   Psychiatric/Behavioral: Negative for depression, substance abuse and suicidal ideas. The patient is nervous/anxious. The patient does not have insomnia.     Objective:  BP (!) 160/70   Pulse (!) 56   Temp 97.7 F (36.5 C)   Ht 5\' 8"  (1.727 m)   Wt 297 lb (134.7 kg)   SpO2 99%   BMI 45.16 kg/m   BP Readings from Last 3 Encounters:  07/15/16 (!) 160/70  04/14/16 118/64  03/29/16 (!) 105/59    Wt Readings from Last 3 Encounters:  07/15/16 297 lb (134.7 kg)  05/03/16 270 lb (122.5 kg)  04/14/16 286 lb (129.7 kg)    Physical Exam  Constitutional: She is oriented to person, place, and time.  Neck: Normal range of motion. Neck supple.  Cardiovascular: Normal rate, regular rhythm and normal heart sounds.   Pulmonary/Chest: Effort normal and breath sounds normal.  Abdominal: Soft.  Musculoskeletal: She exhibits edema. She exhibits no tenderness.  Neurological: She is alert and oriented to person, place, and time.  Skin: Skin is warm and dry.  Vitals reviewed.   Lab Results  Component Value Date   WBC 5.0 07/15/2016   HGB 12.9 07/15/2016    HCT 38.9 07/15/2016   PLT 210.0 07/15/2016   GLUCOSE 92 07/15/2016   CHOL 154 07/15/2016   TRIG 67.0 07/15/2016   HDL 60.00 07/15/2016   LDLCALC 80 07/15/2016   ALT 12 (L) 03/07/2016   AST 17 03/07/2016   NA 140 07/15/2016   K 4.6 07/15/2016   CL 106 07/15/2016   CREATININE 1.37 (H) 07/15/2016   BUN 23 07/15/2016   CO2 29 07/15/2016   TSH 0.48 12/15/2015   INR 1.11 06/16/2014   HGBA1C 5.5 03/07/2016    No results found.  Assessment & Plan:   Estel was seen today for follow-up.  Diagnoses and all orders for this visit:  Essential hypertension -     Basic metabolic panel; Future -     furosemide (LASIX) 20 MG tablet; Take 1 tablet (20 mg total) by mouth daily. -     lisinopril (PRINIVIL,ZESTRIL) 20 MG tablet; Take 1 tablet (20 mg total) by mouth daily. -     potassium citrate (UROCIT-K) 10 MEQ (1080 MG) SR tablet; Take 1 tablet (10 mEq total) by mouth daily.  Controlled steroid-induced diabetes mellitus (HCC) -     sitaGLIPtin (JANUVIA) 100 MG tablet; Take 1 tablet (100 mg total) by mouth daily.  Acute blood loss anemia -     CBC; Future  Hyperlipidemia LDL goal <70 -     Lipid panel; Future  Chronic diastolic heart failure (HCC) -     furosemide (LASIX) 20 MG tablet; Take 1 tablet (20 mg total) by mouth daily. -     aspirin 81 MG tablet; Take 1 tablet (81 mg total) by mouth daily with breakfast. -     potassium citrate (UROCIT-K) 10 MEQ (1080 MG) SR tablet; Take 1 tablet (10 mEq total) by mouth daily.  CKD (chronic kidney disease) stage 3, GFR 30-59 ml/min   I have discontinued Ms. Jiminez's oxyCODONE-acetaminophen, methocarbamol, and ALPRAZolam. I have changed her aspirin to aspirin EC. I have also changed her furosemide. Additionally, I am having her maintain her Clobetasol Prop Emollient Base, fluticasone, albuterol, montelukast, labetalol, atorvastatin, gabapentin, lisinopril, sitaGLIPtin, and potassium citrate.  Meds ordered this encounter  Medications  .  furosemide (LASIX) 20 MG tablet    Sig: Take 1 tablet (20 mg total) by mouth daily.    Dispense:  90 tablet    Refill:  1    Order Specific Question:   Supervising Provider    Answer:   Cassandria Anger [[id number]]  . aspirin 81 MG tablet    Sig: Take 1 tablet (81 mg total) by mouth daily with breakfast.    Dispense:  90 tablet    Refill:  0    Take at least 4 weeks postop for DVT prophylaxis    Order Specific Question:   Supervising Provider    Answer:   Cassandria Anger [[id number]]  .  lisinopril (PRINIVIL,ZESTRIL) 20 MG tablet    Sig: Take 1 tablet (20 mg total) by mouth daily.    Dispense:  90 tablet    Refill:  1    Order Specific Question:   Supervising Provider    Answer:   Cassandria Anger [[id number]]  . sitaGLIPtin (JANUVIA) 100 MG tablet    Sig: Take 1 tablet (100 mg total) by mouth daily.    Dispense:  90 tablet    Refill:  1    Order Specific Question:   Supervising Provider    Answer:   Cassandria Anger [[id number]]  . potassium citrate (UROCIT-K) 10 MEQ (1080 MG) SR tablet    Sig: Take 1 tablet (10 mEq total) by mouth daily.    Dispense:  90 tablet    Refill:  1    Order Specific Question:   Supervising Provider    Answer:   Cassandria Anger [[id number]]    Follow-up: Return in about 6 months (around 01/14/2017) for CPE (fasting).  Wilfred Lacy, NP

## 2016-09-02 DIAGNOSIS — G4733 Obstructive sleep apnea (adult) (pediatric): Secondary | ICD-10-CM | POA: Diagnosis not present

## 2016-09-07 ENCOUNTER — Other Ambulatory Visit: Payer: Self-pay | Admitting: Pulmonary Disease

## 2016-10-07 ENCOUNTER — Telehealth: Payer: Self-pay | Admitting: Nurse Practitioner

## 2016-10-07 DIAGNOSIS — E785 Hyperlipidemia, unspecified: Secondary | ICD-10-CM

## 2016-10-07 MED ORDER — ATORVASTATIN CALCIUM 20 MG PO TABS: 20.0000 mg | ORAL_TABLET | Freq: Every day | ORAL | 0 refills | Status: DC

## 2016-10-07 NOTE — Telephone Encounter (Signed)
Patient is requesting refill of atorvastatin to be sent to [facility] on [address].

## 2016-10-07 NOTE — Telephone Encounter (Signed)
Reviewed chart pt is up-to-date sent refills to [facility].../lmb  

## 2016-10-28 ENCOUNTER — Other Ambulatory Visit: Payer: Self-pay | Admitting: Nurse Practitioner

## 2016-10-28 DIAGNOSIS — Z1231 Encounter for screening mammogram for malignant neoplasm of breast: Secondary | ICD-10-CM

## 2016-11-07 ENCOUNTER — Other Ambulatory Visit: Payer: Self-pay | Admitting: Internal Medicine

## 2016-11-14 DIAGNOSIS — G4733 Obstructive sleep apnea (adult) (pediatric): Secondary | ICD-10-CM | POA: Diagnosis not present

## 2016-11-16 ENCOUNTER — Ambulatory Visit
Admission: RE | Admit: 2016-11-16 | Discharge: 2016-11-16 | Disposition: A | Discharge status: Home / Self Care | Payer: PPO | Source: Ambulatory Visit | Attending: Nurse Practitioner | Admitting: Nurse Practitioner

## 2016-11-16 DIAGNOSIS — Z1231 Encounter for screening mammogram for malignant neoplasm of breast: Secondary | ICD-10-CM | POA: Diagnosis not present

## 2017-01-02 DIAGNOSIS — G4733 Obstructive sleep apnea (adult) (pediatric): Secondary | ICD-10-CM | POA: Diagnosis not present

## 2017-01-17 ENCOUNTER — Ambulatory Visit: Payer: PPO | Admitting: Nurse Practitioner

## 2017-01-18 ENCOUNTER — Ambulatory Visit: Payer: PPO | Admitting: Nurse Practitioner

## 2017-01-19 ENCOUNTER — Ambulatory Visit: Payer: PPO | Admitting: Nurse Practitioner

## 2017-01-19 ENCOUNTER — Encounter: Payer: Self-pay | Admitting: Nurse Practitioner

## 2017-01-19 VITALS — BP 130/68 | HR 62 | Temp 97.8°F | Ht 68.0 in | Wt 317.0 lb

## 2017-01-19 DIAGNOSIS — E1141 Type 2 diabetes mellitus with diabetic mononeuropathy: Secondary | ICD-10-CM

## 2017-01-19 DIAGNOSIS — J309 Allergic rhinitis, unspecified: Secondary | ICD-10-CM

## 2017-01-19 DIAGNOSIS — I10 Essential (primary) hypertension: Secondary | ICD-10-CM

## 2017-01-19 DIAGNOSIS — E875 Hyperkalemia: Secondary | ICD-10-CM | POA: Diagnosis not present

## 2017-01-19 DIAGNOSIS — R202 Paresthesia of skin: Secondary | ICD-10-CM | POA: Diagnosis not present

## 2017-01-19 DIAGNOSIS — N183 Chronic kidney disease, stage 3 unspecified: Secondary | ICD-10-CM

## 2017-01-19 DIAGNOSIS — E785 Hyperlipidemia, unspecified: Secondary | ICD-10-CM | POA: Diagnosis not present

## 2017-01-19 DIAGNOSIS — L03011 Cellulitis of right finger: Secondary | ICD-10-CM

## 2017-01-19 DIAGNOSIS — I5032 Chronic diastolic (congestive) heart failure: Secondary | ICD-10-CM | POA: Diagnosis not present

## 2017-01-19 DIAGNOSIS — Z780 Asymptomatic menopausal state: Secondary | ICD-10-CM | POA: Diagnosis not present

## 2017-01-19 DIAGNOSIS — Z23 Encounter for immunization: Secondary | ICD-10-CM | POA: Diagnosis not present

## 2017-01-19 LAB — BASIC METABOLIC PANEL
BUN: 25 mg/dL — AB (ref 6–23)
CALCIUM: 9.9 mg/dL (ref 8.4–10.5)
CO2: 31 meq/L (ref 19–32)
Chloride: 108 mEq/L (ref 96–112)
Creatinine, Ser: 1.38 mg/dL — ABNORMAL HIGH (ref 0.40–1.20)
GFR: 49.32 mL/min — AB (ref >60.00)
GLUCOSE: 97 mg/dL (ref 70–99)
Potassium: 5.8 mEq/L — ABNORMAL HIGH (ref 3.5–5.1)
Sodium: 145 mEq/L (ref 135–145)

## 2017-01-19 LAB — HEPATIC FUNCTION PANEL
ALBUMIN: 4.3 g/dL (ref 3.5–5.2)
ALK PHOS: 82 U/L (ref 39–117)
ALT: 12 U/L (ref 0–35)
AST: 17 U/L (ref 0–37)
BILIRUBIN TOTAL: 0.4 mg/dL (ref 0.2–1.2)
Bilirubin, Direct: 0.1 mg/dL (ref 0.0–0.3)
TOTAL PROTEIN: 7.9 g/dL (ref 6.0–8.3)

## 2017-01-19 LAB — LIPID PANEL
CHOL/HDL RATIO: 2
Cholesterol: 146 mg/dL (ref 0–200)
HDL: 62.1 mg/dL (ref >39.00)
LDL Cholesterol: 71 mg/dL (ref 0–99)
NONHDL: 83.94
Triglycerides: 67 mg/dL (ref 0.0–149.0)
VLDL: 13.4 mg/dL (ref 0.0–40.0)

## 2017-01-19 LAB — HEMOGLOBIN A1C: HEMOGLOBIN A1C: 5.9 % (ref 4.6–6.5)

## 2017-01-19 LAB — TSH: TSH: 0.81 u[IU]/mL (ref 0.35–4.50)

## 2017-01-19 MED ORDER — MOMETASONE FUROATE 50 MCG/ACT NA SUSP: 2.0000 | Freq: Every day | NASAL | 11 refills | Status: DC

## 2017-01-19 MED ORDER — SITAGLIPTIN PHOSPHATE 100 MG PO TABS: 100.0000 mg | ORAL_TABLET | Freq: Every day | ORAL | 1 refills | Status: DC

## 2017-01-19 MED ORDER — CEPHALEXIN 500 MG PO CAPS: 500.0000 mg | ORAL_CAPSULE | Freq: Three times a day (TID) | ORAL | 0 refills | Status: DC

## 2017-01-19 MED ORDER — MONTELUKAST SODIUM 10 MG PO TABS: ORAL_TABLET | ORAL | 1 refills | Status: DC

## 2017-01-19 NOTE — Patient Instructions (Addendum)
Stable renal function with mild elevation in potassium. Hold potassium supplement for 1week. Stable lipid panel, hepatic function, thyroid function and HgbA1c. Medication refill sent. Need to return to lab for repeat BMP in 1week  Change dressing daily. Keep wound clean and dry at all times.  Advised patient about the importance of foot care and use or shoes at all times.  Advised to maintain DASH diet and regular exercise.  Wound Care, Adult Taking care of your wound properly can help to prevent pain and infection. It can also help your wound to heal more quickly. How is this treated? Wound care  Follow instructions from your health care provider about how to take care of your wound. Make sure you: ? Wash your hands with soap and water before you change the bandage (dressing). If soap and water are not available, use hand sanitizer. ? Change your dressing as told by your health care provider. ? Leave stitches (sutures), skin glue, or adhesive strips in place. These skin closures may need to stay in place for 2 weeks or longer. If adhesive strip edges start to loosen and curl up, you may trim the loose edges. Do not remove adhesive strips completely unless your health care provider tells you to do that.  Check your wound area every day for signs of infection. Check for: ? More redness, swelling, or pain. ? More fluid or blood. ? Warmth. ? Pus or a bad smell.  Ask your health care provider if you should clean the wound with mild soap and water. Doing this may include: ? Using a clean towel to pat the wound dry after cleaning it. Do not rub or scrub the wound. ? Applying a cream or ointment. Do this only as told by your health care provider. ? Covering the incision with a clean dressing.  Ask your health care provider when you can leave the wound uncovered. Medicines   If you were prescribed an antibiotic medicine, cream, or ointment, take or use the antibiotic as told by your  health care provider. Do not stop taking or using the antibiotic even if your condition improves.  Take over-the-counter and prescription medicines only as told by your health care provider. If you were prescribed pain medicine, take it at least 30 minutes before doing any wound care or as told by your health care provider. General instructions  Return to your normal activities as told by your health care provider. Ask your health care provider what activities are safe.  Do not scratch or pick at the wound.  Keep all follow-up visits as told by your health care provider. This is important.  Eat a diet that includes protein, vitamin A, vitamin C, and other nutrient-rich foods. These help the wound heal: ? Protein-rich foods include meat, dairy, beans, nuts, and other sources. ? Vitamin A-rich foods include carrots and dark green, leafy vegetables. ? Vitamin C-rich foods include citrus, tomatoes, and other fruits and vegetables. ? Nutrient-rich foods have protein, carbohydrates, fat, vitamins, or minerals. Eat a variety of healthy foods including vegetables, fruits, and whole grains. Contact a health care provider if:  You received a tetanus shot and you have swelling, severe pain, redness, or bleeding at the injection site.  Your pain is not controlled with medicine.  You have more redness, swelling, or pain around the wound.  You have more fluid or blood coming from the wound.  Your wound feels warm to the touch.  You have pus or a bad smell coming  from the wound.  You have a fever or chills.  You are nauseous or you vomit.  You are dizzy. Get help right away if:  You have a red streak going away from your wound.  The edges of the wound open up and separate.  Your wound is bleeding and the bleeding does not stop with gentle pressure.  You have a rash.  You faint.  You have trouble breathing. This information is not intended to replace advice given to you by your health  care provider. Make sure you discuss any questions you have with your health care provider. Document Released: 11/03/2007 Document Revised: 09/23/2015 Document Reviewed: 08/11/2015 Elsevier Interactive Patient Education  2017 [employer].

## 2017-01-19 NOTE — Progress Notes (Signed)
Subjective:  Patient ID: Stacey Page, female    DOB: 1950-03-22  Age: 66 y.o. MRN: [id number]  CC: Follow-up (6 mo fu/ fasting) and Hand Pain (right index finger to painful to touch)   Wound Check  She was originally treated 5 to 10 days ago. Previous treatment included wound cleansing or irrigation. Her temperature was unmeasured prior to arrival. There has been no drainage from the wound. The redness has worsened. The swelling has worsened. The pain has worsened. She has no difficulty moving the affected extremity or digit.  punctured finger on mental key 1week ago. uptodate with TDAP vaccine.  DM: Does not check glucose at home. Has not made any changes to diet Has not implemented any exercise regimen. Current use of januvia. Denies any adverse side effects. Has chronic LE paresthesia.  eye exam done 05/18/2016  By Dr. Thalia Bloodgood with [facility]. (no retinopathy).  Obesity: Wt Readings from Last 3 Encounters:  01/19/17 (!) 317 lb (143.8 kg)  07/15/16 297 lb (134.7 kg)  05/03/16 270 lb (122.5 kg)   HTN and diastolic CHF: Controlled with labetalol and lisinopril. Noncompliant with diet. BP Readings from Last 3 Encounters:  01/19/17 130/68  07/15/16 (!) 160/70  04/14/16 118/64   She needs the following medication prescriptions printed: nasonex, singulair, and januvia. She is applying for assitance through [employer].  Outpatient Medications Prior to Visit  Medication Sig Dispense Refill  . albuterol (PROAIR HFA) 108 (90 Base) MCG/ACT inhaler Inhale 2 puffs into the lungs every 6 (six) hours as needed for wheezing or shortness of breath. 1 Inhaler 3  . aspirin 81 MG tablet Take 1 tablet (81 mg total) by mouth daily with breakfast. 90 tablet 0  . Clobetasol Prop Emollient Base (CLOBETASOL PROPIONATE E) 0.05 % emollient cream Apply 1 application topically daily as needed (skin irritations). 30 g 2  . potassium citrate (UROCIT-K) 10 MEQ (1080 MG) SR  tablet Take 1 tablet (10 mEq total) by mouth daily. 90 tablet 1  . atorvastatin (LIPITOR) 20 MG tablet Take 1 tablet (20 mg total) by mouth daily at 6 PM. Annual appt due in Dec must see provider for future refills 90 tablet 0  . fluticasone (FLONASE) 50 MCG/ACT nasal spray PLACE 1 SPRAY INTO BOTH NOSTRILS DAILY. 16 g 2  . furosemide (LASIX) 20 MG tablet Take 1 tablet (20 mg total) by mouth daily. 90 tablet 1  . labetalol (NORMODYNE) 200 MG tablet Take 1 tablet (200 mg total) by mouth 2 (two) times daily. 180 tablet 1  . lisinopril (PRINIVIL,ZESTRIL) 20 MG tablet Take 1 tablet (20 mg total) by mouth daily. 90 tablet 1  . montelukast (SINGULAIR) 10 MG tablet TAKE 1 TABLET (10 MG TOTAL) BY MOUTH AT BEDTIME. 90 tablet 1  . sitaGLIPtin (JANUVIA) 100 MG tablet Take 1 tablet (100 mg total) by mouth daily. 90 tablet 1  . gabapentin (NEURONTIN) 300 MG capsule      No facility-administered medications prior to visit.     ROS See HPI  Objective:  BP 130/68   Pulse 62   Temp 97.8 F (36.6 C)   Ht 5\' 8"  (1.727 m)   Wt (!) 317 lb (143.8 kg)   SpO2 96%   BMI 48.20 kg/m   BP Readings from Last 3 Encounters:  01/19/17 130/68  07/15/16 (!) 160/70  04/14/16 118/64    Wt Readings from Last 3 Encounters:  01/19/17 (!) 317 lb (143.8 kg)  07/15/16 297 lb (134.7 kg)  05/03/16 270 lb (122.5 kg)    Physical Exam  Constitutional: She is oriented to person, place, and time. No distress.  Neck: Normal range of motion. Neck supple. No JVD present. No thyromegaly present.  Cardiovascular: Normal rate, regular rhythm and intact distal pulses.  Murmur heard. Pulmonary/Chest: Effort normal and breath sounds normal. No respiratory distress. She has no rales.  Musculoskeletal: Normal range of motion. She exhibits tenderness. She exhibits no edema or deformity.       Right wrist: Normal.       Right hand: She exhibits tenderness, laceration and swelling. She exhibits normal range of motion and no bony  tenderness. Normal sensation noted. Normal strength noted.       Hands:      Right foot: There is no laceration.       Left foot: There is no laceration.  Lymphadenopathy:    She has no cervical adenopathy.  Neurological: She is alert and oriented to person, place, and time.  Skin: Skin is warm and dry. There is erythema.  Completed diabetic foot exam (abnormal)  Psychiatric: She has a normal mood and affect. Her behavior is normal.  Vitals reviewed.   Lab Results  Component Value Date   WBC 5.0 07/15/2016   HGB 12.9 07/15/2016   HCT 38.9 07/15/2016   PLT 210.0 07/15/2016   GLUCOSE 97 01/19/2017   CHOL 146 01/19/2017   TRIG 67.0 01/19/2017   HDL 62.10 01/19/2017   LDLCALC 71 01/19/2017   ALT 12 01/19/2017   AST 17 01/19/2017   NA 145 01/19/2017   K 5.8 (H) 01/19/2017   CL 108 01/19/2017   CREATININE 1.38 (H) 01/19/2017   BUN 25 (H) 01/19/2017   CO2 31 01/19/2017   TSH 0.81 01/19/2017   INR 1.11 06/16/2014   HGBA1C 5.9 01/19/2017    Mm Screening Breast Tomo Bilateral  Result Date: 11/16/2016 CLINICAL DATA:  Screening. EXAM: 2D DIGITAL SCREENING BILATERAL MAMMOGRAM WITH CAD AND ADJUNCT TOMO COMPARISON:  Previous exam(s). ACR Breast Density Category b: There are scattered areas of fibroglandular density. FINDINGS: There are no findings suspicious for malignancy. Images were processed with CAD. IMPRESSION: No mammographic evidence of malignancy. A result letter of this screening mammogram will be mailed directly to the patient. RECOMMENDATION: Screening mammogram in one year. (Code:SM-B-01Y) BI-RADS CATEGORY  1: Negative. Electronically Signed   By: Ammie Ferrier M.D.   On: 11/16/2016 15:05    Assessment & Plan:   Stacey Page was seen today for follow-up and hand pain.  Diagnoses and all orders for this visit:  Essential hypertension -     Basic metabolic panel -     furosemide (LASIX) 20 MG tablet; Take 1 tablet (20 mg total) by mouth daily. -     labetalol  (NORMODYNE) 200 MG tablet; Take 1 tablet (200 mg total) by mouth 2 (two) times daily. -     lisinopril (PRINIVIL,ZESTRIL) 20 MG tablet; Take 1 tablet (20 mg total) by mouth daily.  Type 2 diabetes mellitus with diabetic mononeuropathy, without long-term current use of insulin (HCC) -     sitaGLIPtin (JANUVIA) 100 MG tablet; Take 1 tablet (100 mg total) by mouth daily. -     Basic metabolic panel -     Hemoglobin A1c  CKD (chronic kidney disease) stage 3, GFR 30-59 ml/min (HCC) -     Basic metabolic panel; Future  Hyperlipidemia LDL goal <70 -     Lipid panel -     Hepatic function  panel -     atorvastatin (LIPITOR) 20 MG tablet; Take 1 tablet (20 mg total) by mouth daily at 6 PM.  Allergic rhinitis, unspecified seasonality, unspecified trigger -     mometasone (NASONEX) 50 MCG/ACT nasal spray; Place 2 sprays into the nose daily. -     montelukast (SINGULAIR) 10 MG tablet; TAKE 1 TABLET (10 MG TOTAL) BY MOUTH AT BEDTIME.  Paresthesia of both feet -     TSH  Cellulitis of finger of right hand -     cephALEXin (KEFLEX) 500 MG capsule; Take 1 capsule (500 mg total) by mouth 3 (three) times daily.  Need for vaccination with 13-polyvalent pneumococcal conjugate vaccine -     Pneumococcal conjugate vaccine 13-valent IM  Asymptomatic postmenopausal estrogen deficiency -     DG Bone Density; Future  Chronic diastolic heart failure (HCC) -     furosemide (LASIX) 20 MG tablet; Take 1 tablet (20 mg total) by mouth daily.  Hyperkalemia -     Basic metabolic panel; Future   I have discontinued Rhiana Chadderdon's fluticasone. I have also changed her atorvastatin. Additionally, I am having her start on mometasone and cephALEXin. Lastly, I am having her maintain her Clobetasol Prop Emollient Base, albuterol, gabapentin, aspirin EC, potassium citrate, sitaGLIPtin, montelukast, furosemide, labetalol, and lisinopril.  Meds ordered this encounter  Medications  . mometasone (NASONEX) 50 MCG/ACT  nasal spray    Sig: Place 2 sprays into the nose daily.    Dispense:  17 g    Refill:  11    Order Specific Question:   Supervising Provider    Answer:   Lucille Passy [[id number]]  . sitaGLIPtin (JANUVIA) 100 MG tablet    Sig: Take 1 tablet (100 mg total) by mouth daily.    Dispense:  90 tablet    Refill:  1    Order Specific Question:   Supervising Provider    Answer:   Lucille Passy [[id number]]  . montelukast (SINGULAIR) 10 MG tablet    Sig: TAKE 1 TABLET (10 MG TOTAL) BY MOUTH AT BEDTIME.    Dispense:  90 tablet    Refill:  1    Order Specific Question:   Supervising Provider    Answer:   Lucille Passy [[id number]]  . cephALEXin (KEFLEX) 500 MG capsule    Sig: Take 1 capsule (500 mg total) by mouth 3 (three) times daily.    Dispense:  21 capsule    Refill:  0    Order Specific Question:   Supervising Provider    Answer:   Lucille Passy [[id number]]  . atorvastatin (LIPITOR) 20 MG tablet    Sig: Take 1 tablet (20 mg total) by mouth daily at 6 PM.    Dispense:  90 tablet    Refill:  1    Order Specific Question:   Supervising Provider    Answer:   Lucille Passy [[id number]]  . furosemide (LASIX) 20 MG tablet    Sig: Take 1 tablet (20 mg total) by mouth daily.    Dispense:  90 tablet    Refill:  1    Order Specific Question:   Supervising Provider    Answer:   Lucille Passy [[id number]]  . labetalol (NORMODYNE) 200 MG tablet    Sig: Take 1 tablet (200 mg total) by mouth 2 (two) times daily.    Dispense:  180 tablet    Refill:  1    Order Specific Question:  Supervising Provider    Answer:   Lucille Passy [[id number]]  . lisinopril (PRINIVIL,ZESTRIL) 20 MG tablet    Sig: Take 1 tablet (20 mg total) by mouth daily.    Dispense:  90 tablet    Refill:  1    Order Specific Question:   Supervising Provider    Answer:   Lucille Passy [[id number]]    Follow-up: Return in about 4 weeks (around 02/16/2017) for HTN and CHF.  Wilfred Lacy, NP

## 2017-01-19 NOTE — Assessment & Plan Note (Addendum)
Echo done 12/2015: Left ventricle: The cavity size was normal. Wall thickness was   increased in a pattern of moderate LVH. Systolic function was   vigorous. The estimated ejection fraction was in the range of 65%   to 70%. Wall motion was normal; there were no regional wall   motion abnormalities. Features are consistent with a pseudonormal   left ventricular filling pattern, with concomitant abnormal   relaxation and increased filling pressure (grade 2 diastolic   dysfunction). - Mitral valve: Moderately calcified annulus.  Expressed my concern about her weight gain. Advised her about the importance of DASH diet, decrease food portion and regular exercise.  She canceled appt with cardiology 03/16/2016.

## 2017-01-20 ENCOUNTER — Encounter: Payer: Self-pay | Admitting: Nurse Practitioner

## 2017-01-20 MED ORDER — ATORVASTATIN CALCIUM 20 MG PO TABS: 20.0000 mg | ORAL_TABLET | Freq: Every day | ORAL | 1 refills | Status: DC

## 2017-01-20 MED ORDER — LISINOPRIL 20 MG PO TABS: 20.0000 mg | ORAL_TABLET | Freq: Every day | ORAL | 1 refills | Status: DC

## 2017-01-20 MED ORDER — FUROSEMIDE 20 MG PO TABS: 20.0000 mg | ORAL_TABLET | Freq: Every day | ORAL | 1 refills | Status: DC

## 2017-01-20 MED ORDER — LABETALOL HCL 200 MG PO TABS: 200.0000 mg | ORAL_TABLET | Freq: Two times a day (BID) | ORAL | 1 refills | Status: DC

## 2017-01-24 ENCOUNTER — Encounter: Payer: Self-pay | Admitting: Nurse Practitioner

## 2017-01-24 ENCOUNTER — Ambulatory Visit (HOSPITAL_BASED_OUTPATIENT_CLINIC_OR_DEPARTMENT_OTHER)
Admission: RE | Admit: 2017-01-24 | Discharge: 2017-01-24 | Disposition: A | Discharge status: Home / Self Care | Payer: PPO | Source: Ambulatory Visit | Attending: Nurse Practitioner | Admitting: Nurse Practitioner

## 2017-01-24 DIAGNOSIS — M85851 Other specified disorders of bone density and structure, right thigh: Secondary | ICD-10-CM | POA: Diagnosis not present

## 2017-01-24 DIAGNOSIS — Z87891 Personal history of nicotine dependence: Secondary | ICD-10-CM | POA: Insufficient documentation

## 2017-01-24 DIAGNOSIS — E119 Type 2 diabetes mellitus without complications: Secondary | ICD-10-CM | POA: Diagnosis not present

## 2017-01-24 DIAGNOSIS — Z780 Asymptomatic menopausal state: Secondary | ICD-10-CM | POA: Diagnosis not present

## 2017-01-24 DIAGNOSIS — Z13820 Encounter for screening for osteoporosis: Secondary | ICD-10-CM | POA: Diagnosis not present

## 2017-01-26 ENCOUNTER — Other Ambulatory Visit (INDEPENDENT_AMBULATORY_CARE_PROVIDER_SITE_OTHER): Payer: PPO

## 2017-01-26 DIAGNOSIS — E875 Hyperkalemia: Secondary | ICD-10-CM | POA: Diagnosis not present

## 2017-01-26 DIAGNOSIS — N183 Chronic kidney disease, stage 3 unspecified: Secondary | ICD-10-CM

## 2017-01-26 LAB — BASIC METABOLIC PANEL
BUN: 18 mg/dL (ref 6–23)
CALCIUM: 8.9 mg/dL (ref 8.4–10.5)
CO2: 29 mEq/L (ref 19–32)
CREATININE: 1.17 mg/dL (ref 0.40–1.20)
Chloride: 106 mEq/L (ref 96–112)
GFR: 59.67 mL/min — AB (ref >60.00)
Glucose, Bld: 93 mg/dL (ref 70–99)
Potassium: 4.9 mEq/L (ref 3.5–5.1)
Sodium: 140 mEq/L (ref 135–145)

## 2017-01-30 ENCOUNTER — Telehealth: Payer: Self-pay | Admitting: Nurse Practitioner

## 2017-01-30 DIAGNOSIS — I10 Essential (primary) hypertension: Secondary | ICD-10-CM

## 2017-01-30 DIAGNOSIS — I5032 Chronic diastolic (congestive) heart failure: Secondary | ICD-10-CM

## 2017-01-30 MED ORDER — POTASSIUM CITRATE ER 10 MEQ (1080 MG) PO TBCR: 10.0000 meq | EXTENDED_RELEASE_TABLET | Freq: Every day | ORAL | 1 refills | Status: DC

## 2017-01-30 NOTE — Telephone Encounter (Signed)
-----   Message from Shawnie Pons, LPN sent at [phone]  9:27 AM EST ----- Pt verbalize understand of test result.   Please send in some more refills for potassium to [facility].

## 2017-02-21 ENCOUNTER — Other Ambulatory Visit: Payer: Self-pay | Admitting: Pulmonary Disease

## 2017-02-23 ENCOUNTER — Other Ambulatory Visit: Payer: Self-pay | Admitting: Pulmonary Disease

## 2017-02-28 ENCOUNTER — Encounter: Payer: Self-pay | Admitting: Nurse Practitioner

## 2017-02-28 ENCOUNTER — Ambulatory Visit (INDEPENDENT_AMBULATORY_CARE_PROVIDER_SITE_OTHER): Payer: PPO | Admitting: Nurse Practitioner

## 2017-02-28 ENCOUNTER — Encounter: Payer: Self-pay | Admitting: Internal Medicine

## 2017-02-28 ENCOUNTER — Telehealth: Payer: Self-pay | Admitting: Nurse Practitioner

## 2017-02-28 VITALS — BP 130/70 | HR 59 | Temp 97.6°F | Ht 68.0 in | Wt 340.0 lb

## 2017-02-28 DIAGNOSIS — R0600 Dyspnea, unspecified: Secondary | ICD-10-CM

## 2017-02-28 DIAGNOSIS — I10 Essential (primary) hypertension: Secondary | ICD-10-CM

## 2017-02-28 DIAGNOSIS — N183 Chronic kidney disease, stage 3 unspecified: Secondary | ICD-10-CM

## 2017-02-28 DIAGNOSIS — I5032 Chronic diastolic (congestive) heart failure: Secondary | ICD-10-CM | POA: Diagnosis not present

## 2017-02-28 DIAGNOSIS — F332 Major depressive disorder, recurrent severe without psychotic features: Secondary | ICD-10-CM

## 2017-02-28 DIAGNOSIS — E1141 Type 2 diabetes mellitus with diabetic mononeuropathy: Secondary | ICD-10-CM

## 2017-02-28 DIAGNOSIS — Z6841 Body Mass Index (BMI) 40.0 and over, adult: Secondary | ICD-10-CM

## 2017-02-28 DIAGNOSIS — E66813 Obesity, class 3: Secondary | ICD-10-CM

## 2017-02-28 LAB — BASIC METABOLIC PANEL
BUN: 22 mg/dL (ref 6–23)
CHLORIDE: 107 meq/L (ref 96–112)
CO2: 32 mEq/L (ref 19–32)
Calcium: 10.1 mg/dL (ref 8.4–10.5)
Creatinine, Ser: 1.23 mg/dL — ABNORMAL HIGH (ref 0.40–1.20)
GFR: 56.31 mL/min — ABNORMAL LOW (ref >60.00)
Glucose, Bld: 97 mg/dL (ref 70–99)
POTASSIUM: 6.2 meq/L — AB (ref 3.5–5.1)
SODIUM: 145 meq/L (ref 135–145)

## 2017-02-28 LAB — BRAIN NATRIURETIC PEPTIDE: PRO B NATRI PEPTIDE: 128 pg/mL — AB (ref 0.0–100.0)

## 2017-02-28 MED ORDER — METOPROLOL TARTRATE 100 MG PO TABS: 100.0000 mg | ORAL_TABLET | Freq: Two times a day (BID) | ORAL | 3 refills | Status: DC

## 2017-02-28 NOTE — Progress Notes (Signed)
Subjective:  Patient ID: Stacey Page, female    DOB: 1951-01-20  Age: 67 y.o. MRN: [id number]  CC: Follow-up (4 wk follow up--BP CHF) and Neck Pain (neck pain--curve--painful at time/its been there for while. )  Depression       The patient presents with depression.  This is a recurrent problem.  The current episode started more than 1 year ago.   The onset quality is gradual.   The problem occurs daily.  The problem has been gradually worsening since onset.  Associated symptoms include fatigue, irritable, decreased interest, appetite change, body aches and myalgias.  Associated symptoms include no decreased concentration, no helplessness, no hopelessness, does not have insomnia, no restlessness, no headaches, no indigestion, not sad and no suicidal ideas.     The symptoms are aggravated by family issues.  Past treatments include other medications.  Compliance with treatment is poor.  Past compliance problems include medication issues.  Risk factors include menopause.   Past medical history includes chronic pain, chronic illness, anxiety and depression.     Pertinent negatives include no suicide attempts. Neck Pain   This is a new problem. The current episode started 1 to 4 weeks ago. The problem occurs intermittently. The problem has been waxing and waning. The pain is associated with nothing. The pain is present in the midline (posterior). The quality of the pain is described as aching. The symptoms are aggravated by twisting. Pertinent negatives include no chest pain, fever, headaches, leg pain, numbness, pain with swallowing, paresis, photophobia, syncope, tingling, trouble swallowing, visual change, weakness or weight loss. She has tried heat and home exercises for the symptoms. The treatment provided mild relief.   does not want any medication. Will like to referral to psychology.  Weight Gain: She is concerned about continuous weight gain, but has no motivation to exercise. Reports  increase appetite due to increased stress at home (conflict with children).   Outpatient Medications Prior to Visit  Medication Sig Dispense Refill  . albuterol (PROAIR HFA) 108 (90 Base) MCG/ACT inhaler Inhale 2 puffs into the lungs every 6 (six) hours as needed for wheezing or shortness of breath. 1 Inhaler 3  . aspirin 81 MG tablet Take 1 tablet (81 mg total) by mouth daily with breakfast. 90 tablet 0  . atorvastatin (LIPITOR) 20 MG tablet Take 1 tablet (20 mg total) by mouth daily at 6 PM. 90 tablet 1  . Clobetasol Prop Emollient Base (CLOBETASOL PROPIONATE E) 0.05 % emollient cream Apply 1 application topically daily as needed (skin irritations). 30 g 2  . fluticasone (FLONASE) 50 MCG/ACT nasal spray PLACE 1 SPRAY INTO BOTH NOSTRILS DAILY. 16 g 2  . furosemide (LASIX) 20 MG tablet Take 1 tablet (20 mg total) by mouth daily. 90 tablet 1  . lisinopril (PRINIVIL,ZESTRIL) 20 MG tablet Take 1 tablet (20 mg total) by mouth daily. 90 tablet 1  . mometasone (NASONEX) 50 MCG/ACT nasal spray Place 2 sprays into the nose daily. 17 g 11  . montelukast (SINGULAIR) 10 MG tablet TAKE 1 TABLET (10 MG TOTAL) BY MOUTH AT BEDTIME. 90 tablet 1  . sitaGLIPtin (JANUVIA) 100 MG tablet Take 1 tablet (100 mg total) by mouth daily. 90 tablet 1  . cephALEXin (KEFLEX) 500 MG capsule Take 1 capsule (500 mg total) by mouth 3 (three) times daily. 21 capsule 0  . gabapentin (NEURONTIN) 300 MG capsule     . labetalol (NORMODYNE) 200 MG tablet Take 1 tablet (200 mg total) by mouth  2 (two) times daily. 180 tablet 1  . potassium citrate (UROCIT-K) 10 MEQ (1080 MG) SR tablet Take 1 tablet (10 mEq total) by mouth daily. 90 tablet 1   No facility-administered medications prior to visit.     ROS See HPI  Objective:  BP 130/70   Pulse (!) 59   Temp 97.6 F (36.4 C)   Ht 5\' 8"  (1.727 m)   Wt (!) 340 lb (154.2 kg)   SpO2 95%   BMI 51.70 kg/m   BP Readings from Last 3 Encounters:  02/28/17 130/70  01/19/17 130/68    07/15/16 (!) 160/70    Wt Readings from Last 3 Encounters:  02/28/17 (!) 340 lb (154.2 kg)  01/19/17 (!) 317 lb (143.8 kg)  07/15/16 297 lb (134.7 kg)    Physical Exam  Constitutional: She is oriented to person, place, and time. She is irritable. No distress.  Neck: No JVD present.  Cardiovascular: Normal rate, regular rhythm and normal heart sounds.  Pulmonary/Chest: Effort normal and breath sounds normal. No respiratory distress. She has no wheezes. She has no rales.  Musculoskeletal: She exhibits no edema.  Neurological: She is alert and oriented to person, place, and time.  Skin: Skin is warm and dry.  Vitals reviewed.   Lab Results  Component Value Date   WBC 5.0 07/15/2016   HGB 12.9 07/15/2016   HCT 38.9 07/15/2016   PLT 210.0 07/15/2016   GLUCOSE 97 02/28/2017   CHOL 146 01/19/2017   TRIG 67.0 01/19/2017   HDL 62.10 01/19/2017   LDLCALC 71 01/19/2017   ALT 12 01/19/2017   AST 17 01/19/2017   NA 145 02/28/2017   K 6.2 (HH) 02/28/2017   CL 107 02/28/2017   CREATININE 1.23 (H) 02/28/2017   BUN 22 02/28/2017   CO2 32 02/28/2017   TSH 0.81 01/19/2017   INR 1.11 06/16/2014   HGBA1C 5.9 01/19/2017    Dg Bone Density  Result Date: 01/24/2017 EXAM: DUAL X-RAY ABSORPTIOMETRY (DXA) FOR BONE MINERAL DENSITY IMPRESSION: Referring Physician:  [name] [name] PATIENT: Name: Stacey, Page Patient ID: [id number] Birth Date: August 03, 1950 Height: 66.5 in. Sex: Female Measured: 01/24/2017 Weight: 330.0 lbs. Indications: African-American, Diabetic, Estrogen Deficiency, Post Menopausal, Previous Tobacco User Fractures: Treatments: Januvia, Prednisone ASSESSMENT: The BMD measured at Femur Total Right is 0.836 g/cm2 with a T-score of -1.4. This patient is considered osteopenic according to [facility]) criteria. L- 4 was excluded due to degenerative changes. Site Region Measured Date Measured Age WHO YA BMD Classification T-score AP Spine L1-L3 01/24/2017 65.1  Normal 1.5 1.355 g/cm2 DualFemur Total Right 2/24/2017 65.1 [age] Osteopenia -1.4 0.836 g/cm2 World Health Organization [facility]) criteria for post-menopausal, Caucasian Women: Normal       T-score at or above -1 SD Osteopenia   T-score between -1 and -2.5 SD Osteoporosis T-score at or below -2.5 SD RECOMMENDATION: [place] recommends that FDA-approved medical therapies be considered in postmenopausal women and men age 23 or older with a: 1. Hip or vertebral (clinical or morphometric) fracture. 2. T-score of < -2.5 at the spine or hip. 3. Ten-year fracture probability by FRAX of 3% or greater for hip fracture or 20% or greater for major osteoporotic fracture. All treatment decisions require clinical judgment and consideration of individual patient factors, including patient preferences, co-morbidities, previous drug use, risk factors not captured in the FRAX model (e.g. falls, vitamin D deficiency, increased bone turnover, interval significant decline in bone density) and possible under - or over-estimation  of fracture risk by FRAX. All patients should ensure an adequate intake of dietary calcium (1200 mg/d) and vitamin D (800 IU daily) unless contraindicated. FOLLOW-UP: People with diagnosed cases of osteoporosis or at high risk for fracture should have regular bone mineral density tests. For patients eligible for Medicare, routine testing is allowed once every 2 years. The testing frequency can be increased to one year for patients who have rapidly progressing disease, those who are receiving or discontinuing medical therapy to restore bone mass, or have additional risk factors. I have reviewed this report and agree with the above findings. [facility] Patient: Stacey Page   Referring Physician: [name] [name] Birth Date: Feb 06, 1951 [age]: [age] years Patient ID: [id number] Height: 66.5 in. Weight: 330.0 lbs. Measured: 01/24/2017 8:35:44 AM (16 SP 2) Sex: Female Ethnicity:  African-American Analyzed: 01/24/2017 8:36:47 AM (16 SP 2) FRAX* 10-year Probability of Fracture Based on femoral neck BMD: DualFemur (Right) Major Osteoporotic Fracture: 2.9% Hip Fracture:                0.2% Population:                  [place] (Black) Risk Factors:                None *FRAX is a Materials engineer of the [employer] for Metabolic Bone Disease, a [place] (WHO) [facility]. ASSESSMENT: The probability of a major osteoporotic fracture is 2.9% within the next ten years. The probability of a hip fracture is 0.2% within the next ten years. Electronically Signed   By: Marijo Conception, M.D.   On: 01/24/2017 10:14   Assessment & Plan:   [place] was seen today for follow-up and neck pain.  Diagnoses and all orders for this visit:  CKD (chronic kidney disease) stage 3, GFR 30-59 ml/min (HCC) -     Basic metabolic panel  Type 2 diabetes mellitus with diabetic mononeuropathy, without long-term current use of insulin (HCC) -     Basic metabolic panel -     Ambulatory referral to diabetic education  Essential hypertension -     Basic metabolic panel -     Ambulatory referral to Cardiology -     metoprolol tartrate (LOPRESSOR) 100 MG tablet; Take 1 tablet (100 mg total) by mouth 2 (two) times daily.  Chronic diastolic heart failure (HCC) -     B Nat Peptide -     Ambulatory referral to Cardiology -     metoprolol tartrate (LOPRESSOR) 100 MG tablet; Take 1 tablet (100 mg total) by mouth 2 (two) times daily.  PND (paroxysmal nocturnal dyspnea) -     B Nat Peptide  Class 3 severe obesity due to excess calories with serious comorbidity and body mass index (BMI) of 50.0 to 59.9 in adult [facility]) -     Ambulatory referral to diabetic education  Severe episode of recurrent major depressive disorder, without psychotic features ([place]) -     Ambulatory referral to Psychology   I have discontinued Audria Gikas's gabapentin, cephALEXin,  labetalol, and potassium citrate. I am also having her start on metoprolol tartrate. Additionally, I am having her maintain her Clobetasol Prop Emollient Base, albuterol, aspirin EC, mometasone, sitaGLIPtin, montelukast, atorvastatin, furosemide, lisinopril, and fluticasone.  Meds ordered this encounter  Medications  . metoprolol tartrate (LOPRESSOR) 100 MG tablet    Sig: Take 1 tablet (100 mg total) by mouth 2 (two) times daily.    Dispense:  180 tablet    Refill:  3    Discontinue labetalol    Order Specific Question:   Supervising Provider    Answer:   Lucille Passy [[id number]]    Follow-up: Return in about 1 month (around 03/31/2017) for weight loss, DM, CHF and HTN (fasting)-38mins appt.  Wilfred Lacy, NP

## 2017-02-28 NOTE — Assessment & Plan Note (Signed)
Stable renal function with hyperkalemia. Discontinued potassium supplement.

## 2017-02-28 NOTE — Assessment & Plan Note (Addendum)
Entered new cardiology referral. changed labetalol to metoprolol. proBNP of 128, increase furosemide 40mg  once a day x 2days, then 20mg  once a dau continuously.

## 2017-02-28 NOTE — Telephone Encounter (Signed)
Stacey Page, nurse from [facility] calling stating that the pt has a critical Potassium level of 6.2.  On call physician, Scarlette Calico called x2 and detailed message left on voicemail regarding lab result.

## 2017-02-28 NOTE — Patient Instructions (Addendum)
Contacted Ms. Roger via [employer] number on file. Informed her about lab results. Advance to stop potassium supplement. Increase furosemide to 40mg  once a day x 2days, then resume 20mg  once a day continuously. Entered about referral to cardiology. F/up in 28month  Calorie Counting for Weight Loss Calories are units of energy. Your body needs a certain amount of calories from food to keep you going throughout the day. When you eat more calories than your body needs, your body stores the extra calories as fat. When you eat fewer calories than your body needs, your body burns fat to get the energy it needs. Calorie counting means keeping track of how many calories you eat and drink each day. Calorie counting can be helpful if you need to lose weight. If you make sure to eat fewer calories than your body needs, you should lose weight. Ask your health care provider what a healthy weight is for you. For calorie counting to work, you will need to eat the right number of calories in a day in order to lose a healthy amount of weight per week. A dietitian can help you determine how many calories you need in a day and will give you suggestions on how to reach your calorie goal.  A healthy amount of weight to lose per week is usually 1-2 lb (0.5-0.9 kg). This usually means that your daily calorie intake should be reduced by 500-750 calories.  Eating 1,200 - 1,500 calories per day can help most women lose weight.  Eating 1,500 - 1,800 calories per day can help most men lose weight.  What is my plan? My goal is to have __1500________ calories per day. If I have this many calories per day, I should lose around ____1______ pounds per week. What do I need to know about calorie counting? In order to meet your daily calorie goal, you will need to:  Find out how many calories are in each food you would like to eat. Try to do this before you eat.  Decide how much of the food you plan to eat.  Write down  what you ate and how many calories it had. Doing this is called keeping a food log.  To successfully lose weight, it is important to balance calorie counting with a healthy lifestyle that includes regular activity. Aim for 150 minutes of moderate exercise (such as walking) or 75 minutes of vigorous exercise (such as running) each week. Where do I find calorie information?  The number of calories in a food can be found on a Nutrition Facts label. If a food does not have a Nutrition Facts label, try to look up the calories online or ask your dietitian for help. Remember that calories are listed per serving. If you choose to have more than one serving of a food, you will have to multiply the calories per serving by the amount of servings you plan to eat. For example, the label on a package of bread might say that a serving size is 1 slice and that there are 90 calories in a serving. If you eat 1 slice, you will have eaten 90 calories. If you eat 2 slices, you will have eaten 180 calories. How do I keep a food log? Immediately after each meal, record the following information in your food log:  What you ate. Don't forget to include toppings, sauces, and other extras on the food.  How much you ate. This can be measured in cups, ounces, or number  of items.  How many calories each food and drink had.  The total number of calories in the meal.  Keep your food log near you, such as in a small notebook in your pocket, or use a mobile app or website. Some programs will calculate calories for you and show you how many calories you have left for the day to meet your goal. What are some calorie counting tips?  Use your calories on foods and drinks that will fill you up and not leave you hungry: ? Some examples of foods that fill you up are nuts and nut butters, vegetables, lean proteins, and high-fiber foods like whole grains. High-fiber foods are foods with more than 5 g fiber per serving. ? Drinks such as  sodas, specialty coffee drinks, alcohol, and juices have a lot of calories, yet do not fill you up.  Eat nutritious foods and avoid empty calories. Empty calories are calories you get from foods or beverages that do not have many vitamins or protein, such as candy, sweets, and soda. It is better to have a nutritious high-calorie food (such as an avocado) than a food with few nutrients (such as a bag of chips).  Know how many calories are in the foods you eat most often. This will help you calculate calorie counts faster.  Pay attention to calories in drinks. Low-calorie drinks include water and unsweetened drinks.  Pay attention to nutrition labels for "low fat" or "fat free" foods. These foods sometimes have the same amount of calories or more calories than the full fat versions. They also often have added sugar, starch, or salt, to make up for flavor that was removed with the fat.  Find a way of tracking calories that works for you. Get creative. Try different apps or programs if writing down calories does not work for you. What are some portion control tips?  Know how many calories are in a serving. This will help you know how many servings of a certain food you can have.  Use a measuring cup to measure serving sizes. You could also try weighing out portions on a kitchen scale. With time, you will be able to estimate serving sizes for some foods.  Take some time to put servings of different foods on your favorite plates, bowls, and cups so you know what a serving looks like.  Try not to eat straight from a bag or box. Doing this can lead to overeating. Put the amount you would like to eat in a cup or on a plate to make sure you are eating the right portion.  Use smaller plates, glasses, and bowls to prevent overeating.  Try not to multitask (for example, watch TV or use your computer) while eating. If it is time to eat, sit down at a table and enjoy your food. This will help you to know  when you are full. It will also help you to be aware of what you are eating and how much you are eating. What are tips for following this plan? Reading food labels  Check the calorie count compared to the serving size. The serving size may be smaller than what you are used to eating.  Check the source of the calories. Make sure the food you are eating is high in vitamins and protein and low in saturated and trans fats. Shopping  Read nutrition labels while you shop. This will help you make healthy decisions before you decide to purchase your food.  Make  a grocery list and stick to it. Cooking  Try to cook your favorite foods in a healthier way. For example, try baking instead of frying.  Use low-fat dairy products. Meal planning  Use more fruits and vegetables. Half of your plate should be fruits and vegetables.  Include lean proteins like poultry and fish. How do I count calories when eating out?  Ask for smaller portion sizes.  Consider sharing an entree and sides instead of getting your own entree.  If you get your own entree, eat only half. Ask for a box at the beginning of your meal and put the rest of your entree in it so you are not tempted to eat it.  If calories are listed on the menu, choose the lower calorie options.  Choose dishes that include vegetables, fruits, whole grains, low-fat dairy products, and lean protein.  Choose items that are boiled, broiled, grilled, or steamed. Stay away from items that are buttered, battered, fried, or served with cream sauce. Items labeled "crispy" are usually fried, unless stated otherwise.  Choose water, low-fat milk, unsweetened iced tea, or other drinks without added sugar. If you want an alcoholic beverage, choose a lower calorie option such as a glass of wine or light beer.  Ask for dressings, sauces, and syrups on the side. These are usually high in calories, so you should limit the amount you eat.  If you want a salad,  choose a garden salad and ask for grilled meats. Avoid extra toppings like bacon, cheese, or fried items. Ask for the dressing on the side, or ask for olive oil and vinegar or lemon to use as dressing.  Estimate how many servings of a food you are given. For example, a serving of cooked rice is  cup or about the size of half a baseball. Knowing serving sizes will help you be aware of how much food you are eating at restaurants. The list below tells you how big or small some common portion sizes are based on everyday objects: ? 1 oz-4 stacked dice. ? 3 oz-1 deck of cards. ? 1 tsp-1 die. ? 1 Tbsp- a ping-pong ball. ? 2 Tbsp-1 ping-pong ball. ?  cup- baseball. ? 1 cup-1 baseball. Summary  Calorie counting means keeping track of how many calories you eat and drink each day. If you eat fewer calories than your body needs, you should lose weight.  A healthy amount of weight to lose per week is usually 1-2 lb (0.5-0.9 kg). This usually means reducing your daily calorie intake by 500-750 calories.  The number of calories in a food can be found on a Nutrition Facts label. If a food does not have a Nutrition Facts label, try to look up the calories online or ask your dietitian for help.  Use your calories on foods and drinks that will fill you up, and not on foods and drinks that will leave you hungry.  Use smaller plates, glasses, and bowls to prevent overeating. This information is not intended to replace advice given to you by your health care provider. Make sure you discuss any questions you have with your health care provider. Document Released: 01/24/2005 Document Revised: 12/25/2015 Document Reviewed: 12/25/2015 Elsevier Interactive Patient Education  [employer].

## 2017-03-01 ENCOUNTER — Encounter: Payer: Self-pay | Admitting: Nurse Practitioner

## 2017-03-01 NOTE — Telephone Encounter (Signed)
Pt notified of lab result. 

## 2017-03-10 HISTORY — PX: TRANSTHORACIC ECHOCARDIOGRAM: SHX275

## 2017-03-21 ENCOUNTER — Encounter: Payer: PPO | Attending: Nurse Practitioner | Admitting: Registered"

## 2017-03-21 ENCOUNTER — Encounter: Payer: Self-pay | Admitting: Registered"

## 2017-03-21 DIAGNOSIS — E119 Type 2 diabetes mellitus without complications: Secondary | ICD-10-CM | POA: Insufficient documentation

## 2017-03-21 DIAGNOSIS — Z6841 Body Mass Index (BMI) 40.0 and over, adult: Secondary | ICD-10-CM | POA: Diagnosis not present

## 2017-03-21 DIAGNOSIS — E1141 Type 2 diabetes mellitus with diabetic mononeuropathy: Secondary | ICD-10-CM | POA: Diagnosis not present

## 2017-03-21 DIAGNOSIS — Z713 Dietary counseling and surveillance: Secondary | ICD-10-CM | POA: Diagnosis not present

## 2017-03-21 NOTE — Progress Notes (Signed)
Diabetes Self-Management Education  Visit Type: First/Initial  Appt. Start Time: 1035 Appt. End Time: 3220  03/21/2017  [name], identified by name and date of birth, is a 67 y.o. female with a diagnosis of Diabetes: Type 2.   ASSESSMENT Patient states she has some depression and made an appointment to see a therapist, states when she found out there was a $40 co-pay she decided she needed to save her money for her co-pay for upcoming visit with cardiologist. RD suggested she look into [facility] for additional resources to help keep her engaged in things she enjoys.  Patient states she doesn't work and sits around the house watching TV and feels bored. 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  Patient states she doesn't like to cook. Patient reports she has 2 daughters living with her who do the cooking, one cooks healthy vegetables, but without seasoning. Patient states she has requested that they do not fry foods and they have been baking or steaming more. Patient states she had been drinking whole milk until the last couple of years when she switched to 2%.  Diabetes Self-Management Education - 03/21/17 1052      Visit Information   Visit Type  First/Initial      Initial Visit   Diabetes Type  Type 2    Are you currently following a meal plan?  No    Are you taking your medications as prescribed?  Yes Januvia    Date Diagnosed  2010      Health Coping   How would you rate your overall health?  Fair      Psychosocial Assessment   Patient Belief/Attitude about Diabetes  Defeat/Burnout    What is the last grade level you completed in school?  12      Complications   Last HgB A1C per patient/outside source  5.9 % per chart    How often do you check your blood sugar?  0 times/day (not testing)    Have you had a dilated eye exam in the past 12 months?  Yes    Have you had a dental exam in the past 12 months?   No    Are you checking your feet?  Yes    How many days per week are you checking your feet?  3      Dietary Intake   Breakfast  belvita, pb, OR blueberry muffin with butter    Snack (morning)  none    Dinner  fried chicken, lima beans, broccoli    Snack (evening)  potato chips    Beverage(s)  water      Exercise   Exercise Type  Light (walking / raking leaves)    How many days per week to you exercise?  5    How many minutes per day do you exercise?  10    Total minutes per week of exercise  50      Patient Education   Previous Diabetes Education  Yes (please comment) in [place] before moving here, and at [place] 2016, 2017    Disease state   Definition of diabetes, type 1 and 2, and the diagnosis of diabetes    Nutrition management   Role of diet in the treatment of diabetes and the relationship between the three main macronutrients and blood glucose level    Physical activity and exercise   Role of exercise on diabetes management, blood  pressure control and cardiac health.      Individualized Goals (developed by patient)   Nutrition  General guidelines for healthy choices and portions discussed    Physical Activity  Exercise 3-5 times per week      Outcomes   Expected Outcomes  Demonstrated interest in learning. Expect positive outcomes    Future DMSE  PRN     Individualized Plan for Diabetes Self-Management Training:   Learning Objective:  Patient will have a greater understanding of diabetes self-management. Patient education plan is to attend individual and/or group sessions per assessed needs and concerns.  Patient Instructions  Consider having protein for breakfast, egg on toast Consider getting a routine of activities, pool exercises couple times a week, walking, chair exercise (this will also help with making your bones stronger) To help with constipation continue drinking plenty of water, get more fiber (beans are great for fiber), movement.  Expected Outcomes:   Demonstrated interest in learning. Expect positive outcomes  Education material provided: My Plate, Arm Chair exercises  If problems or questions, patient to contact team via:  Phone  Future DSME appointment: PRN

## 2017-03-21 NOTE — Patient Instructions (Signed)
Consider having protein for breakfast, egg on toast Consider getting a routine of activities, pool exercises couple times a week, walking, chair exercise (this will also help with making your bones stronger) To help with constipation continue drinking plenty of water, get more fiber (beans are great for fiber), movement.

## 2017-03-22 ENCOUNTER — Other Ambulatory Visit: Payer: Self-pay

## 2017-03-22 ENCOUNTER — Ambulatory Visit: Payer: PPO | Admitting: Psychology

## 2017-03-23 ENCOUNTER — Ambulatory Visit: Payer: PPO | Admitting: Physician Assistant

## 2017-03-23 ENCOUNTER — Encounter: Payer: Self-pay | Admitting: Physician Assistant

## 2017-03-23 VITALS — BP 118/76 | HR 48 | Ht 68.0 in | Wt 342.0 lb

## 2017-03-23 DIAGNOSIS — I5032 Chronic diastolic (congestive) heart failure: Secondary | ICD-10-CM

## 2017-03-23 DIAGNOSIS — I10 Essential (primary) hypertension: Secondary | ICD-10-CM | POA: Diagnosis not present

## 2017-03-23 DIAGNOSIS — R011 Cardiac murmur, unspecified: Secondary | ICD-10-CM | POA: Diagnosis not present

## 2017-03-23 DIAGNOSIS — R001 Bradycardia, unspecified: Secondary | ICD-10-CM

## 2017-03-23 MED ORDER — METOPROLOL TARTRATE 50 MG PO TABS: 50.0000 mg | ORAL_TABLET | Freq: Two times a day (BID) | ORAL | 5 refills | Status: DC

## 2017-03-23 MED ORDER — FUROSEMIDE 20 MG PO TABS: 20.0000 mg | ORAL_TABLET | Freq: Every day | ORAL | 1 refills | Status: DC

## 2017-03-23 NOTE — Patient Instructions (Signed)
Medication Instructions:  INCREASE Lasix Take a extra tablet on Mondays and Thursdays DECREASE Metoprolol 50mg  take 1 tablet twice a day  Labwork: Continue to have your primary care physician maintain your labs  Testing/Procedures: Your physician has requested that you have an echocardiogram. Echocardiography is a painless test that uses sound waves to create images of your heart. It provides your doctor with information about the size and shape of your heart and how well your heart's chambers and valves are working. This procedure takes approximately one hour. There are no restrictions for this procedure. [place]  Follow-Up: Your physician recommends that you schedule a follow-up appointment in: 1 month with Dr Ellyn Hack or Rosaria Ferries, PA-C.  Any Other Special Instructions Will Be Listed Below (If Applicable). 1. ONLY EAT 2000MG  OF SODIUM/SALT A DAY  2. DRINK 1-2 LITERS of liquid a day 3.  CHECK YOUR WEIGHT DAILY 4. INCREASE YOUR ACTIVITY  If you need a refill on your cardiac medications before your next appointment, please call your pharmacy.

## 2017-03-23 NOTE — Progress Notes (Signed)
Cardiology Office Note   Date:  03/23/2017   ID:  Stacey Page, DOB [birth year], MRN [id number]  PCP:  Flossie Buffy, NP  Cardiologist:  New, Dr Nolon Stalls, PA-C   Chief Complaint  Patient presents with  . Follow-up    NP.  Marland Kitchen Fatigue  . Shortness of Breath  . Chest Pain    History of Present Illness: Stacey Page is a 67 y.o. female with a history of DM, HTN, depression, D-CHF, asthma, GERD, OSA on CPAP, OA  PCP referred pt to cards for CHF management.   Stacey Page presents for cardiology evaluation for CHF.  She was referred to cards by her PCP because of weight gain and SOB.   She had knee surgery 03/2016 and was 298 lbs at that time. Since that time, she has gained over 40 lbs. The weight gain has been steady, but gradual.    She saw a dietician the other day. She is trying to make changes in her eating habits. She lives with family and they do the cooking. One of her daughters cooks very healthy, the other one cooks with "love".   She is SOB w/ exertion. She feels tired all the time when she walks. She cannot walk any distance without stopping to rest. This has been going on for a while, she does not report a recent change.   She does not wake with LE edema. She is compliant w/ CPAP. If she does not use it, she will wake during the night. Denies orthopnea.   She had some R lateral CP the other day, after eating greasy foods. She was bothered a lot by gas at the time. Sx improved. No hx exertional CP.   She does not have scales.   She feels she is doing some emotional eating. She admits to dietary indiscretion with both low-sodium and diabetic restrictions. She drinks multiple bottles of water daily.   Past Medical History:  Diagnosis Date  . Anxiety    doesn't take any meds  . Arthritis of right knee 03/14/2016  . Asthma   . CHF (congestive heart failure) (HCC)    takes Furosemide daily  . COPD (chronic obstructive pulmonary  disease) (HCC)    Albuterol as needed  . Depression    doesn't take meds  . Diabetes ([place])    takes Januvia daily  . Eczema    uses cream as needed  . GERD (gastroesophageal reflux disease)   . History of bronchitis as a child   . HTN (hypertension)    takes Lisinopril and Labetalol daily  . Insomnia   . Joint pain   . Joint swelling   . OA (osteoarthritis)   . OSA (obstructive sleep apnea) 02/25/2014   wears CPAP at night  . Peripheral neuropathy    takes Gabapentin as needed  . Pneumonia    hx of-2010  . Seasonal allergies    uses Flonase daily    Past Surgical History:  Procedure Laterality Date  . CESAREAN SECTION  x2  . COLONOSCOPY    . JOINT REPLACEMENT    . KNEE ARTHROSCOPY Right   . LUNG BIOPSY Right 06/10/2014   Procedure: LUNG BIOPSY;  Surgeon: Ivin Poot, MD;  Location: [facility];  Service: Thoracic;  Laterality: Right;  . POLYPECTOMY     throat  . TOTAL KNEE ARTHROPLASTY Right 03/14/2016   Procedure: RIGHT TOTAL KNEE ARTHROPLASTY;  Surgeon: Marybelle Killings, MD;  Location: [place];  Service: Orthopedics;  Laterality: Right;  Marland Kitchen VIDEO ASSISTED THORACOSCOPY Right 06/10/2014   Procedure: VIDEO ASSISTED THORACOSCOPY;  Surgeon: Ivin Poot, MD;  Location[facility];  Service: Thoracic;  Laterality: Right;    Current Outpatient Medications  Medication Sig Dispense Refill  . albuterol (PROAIR HFA) 108 (90 Base) MCG/ACT inhaler Inhale 2 puffs into the lungs every 6 (six) hours as needed for wheezing or shortness of breath. 1 Inhaler 3  . aspirin 81 MG tablet Take 1 tablet (81 mg total) by mouth daily with breakfast. 90 tablet 0  . atorvastatin (LIPITOR) 20 MG tablet Take 1 tablet (20 mg total) by mouth daily at 6 PM. 90 tablet 1  . Calcium Carbonate-Vitamin D (CALCIUM 600+D) 600-400 MG-UNIT tablet Take 1 tablet by mouth daily.    . cholecalciferol (VITAMIN D) 1000 units tablet Take 1,000 Units by mouth daily.    . Clobetasol Prop Emollient Base (CLOBETASOL PROPIONATE E) 0.05 %  emollient cream Apply 1 application topically daily as needed (skin irritations). 30 g 2  . ferrous sulfate (FEROSUL) 325 (65 FE) MG tablet Take 325 mg by mouth daily with breakfast.    . fluticasone (FLONASE) 50 MCG/ACT nasal spray PLACE 1 SPRAY INTO BOTH NOSTRILS DAILY. 16 g 2  . furosemide (LASIX) 20 MG tablet Take 1 tablet (20 mg total) by mouth daily. 90 tablet 1  . lisinopril (PRINIVIL,ZESTRIL) 20 MG tablet Take 1 tablet (20 mg total) by mouth daily. 90 tablet 1  . metoprolol tartrate (LOPRESSOR) 100 MG tablet Take 1 tablet (100 mg total) by mouth 2 (two) times daily. 180 tablet 3  . mometasone (NASONEX) 50 MCG/ACT nasal spray Place 2 sprays into the nose daily. 17 g 11  . montelukast (SINGULAIR) 10 MG tablet TAKE 1 TABLET (10 MG TOTAL) BY MOUTH AT BEDTIME. 90 tablet 1  . sitaGLIPtin (JANUVIA) 100 MG tablet Take 1 tablet (100 mg total) by mouth daily. 90 tablet 1   No current facility-administered medications for this visit.     Allergies:   No known allergies    Social History:  The patient  reports that she quit smoking about 37 years ago. Her smoking use included cigarettes. She has a 3.75 pack-year smoking history. She has quit using smokeless tobacco. She reports that she drinks alcohol. She reports that she does not use drugs.   Family History:  The patient's family history includes Anemia in her father; COPD in her father; Cancer (age of onset: 96) in her mother; Hypothyroidism in her father.    ROS:  Please see the history of present illness. All other systems are reviewed and negative.     PHYSICAL EXAM: VS:  BP 118/76   Pulse (!) 48   Ht 5\' 8"  (1.727 m)   Wt (!) 342 lb (155.1 kg)   BMI 52.00 kg/m  , BMI Body mass index is 52 kg/m. GEN: Well nourished, well developed, female in no acute distress  HEENT: normal for age  Neck: no JVD, no carotid bruit, no masses Cardiac: RRR; 2/6 murmur, no rubs, or gallops Respiratory: decreased BS bases bilaterally, normal work of  breathing GI: soft, nontender, nondistended, + BS MS: no deformity or atrophy; trace LE edema; distal pulses are 2+ in all 4 extremities   Skin: warm and dry, no rash Neuro:  Strength and sensation are intact Psych: euthymic mood, full affect   EKG:  EKG is ordered today. The ekg ordered today demonstrates sinus brady, HR 48, PR 210 ms,  other intervals normal no Q waves or ischemic changes.  ECHO: 12/09/2015 - Left ventricle: The cavity size was normal. Wall thickness was   increased in a pattern of moderate LVH. Systolic function was   vigorous. The estimated ejection fraction was in the range of 65%   to 70%. Wall motion was normal; there were no regional wall   motion abnormalities. Features are consistent with a pseudonormal   left ventricular filling pattern, with concomitant abnormal   relaxation and increased filling pressure (grade 2 diastolic   dysfunction). - Mitral valve: Moderately calcified annulus.   Recent Labs: 07/15/2016: Hemoglobin 12.9; Platelets 210.0 01/19/2017: ALT 12; TSH 0.81 02/28/2017: BUN 22; Creatinine, Ser 1.23; Potassium 6.2; Pro B Natriuretic peptide (BNP) 128.0; Sodium 145    Lipid Panel    Component Value Date/Time   CHOL 146 01/19/2017 1013   TRIG 67.0 01/19/2017 1013   HDL 62.10 01/19/2017 1013   CHOLHDL 2 01/19/2017 1013   VLDL 13.4 01/19/2017 1013   LDLCALC 71 01/19/2017 1013     Wt Readings from Last 3 Encounters:  03/23/17 (!) 342 lb (155.1 kg)  02/28/17 (!) 340 lb (154.2 kg)  01/19/17 (!) 317 lb (143.8 kg)     Other studies Reviewed: Additional studies/ records that were reviewed today include: office notes and testing.  ASSESSMENT AND PLAN: The patient and plan were reviewed with Dr. Ellyn Hack, who agrees.  1.  Chronic diastolic CHF: Her volume status is pretty good today, she is only mildly volume overload. Her baseline Lasix dose is low. I will ask her to take extra Lasix x 2 days a week, otherwise resume previous dose.  It has  been over a year since her last echo and she has a murmur that may be aortic sclerosis or mild aortic stenosis.  Check echo  She has significant dietary indiscretions. She will work with her family on having fewer things in the house she should not eat and try to make sure she has tasty DM/low Na choices at meals. Emphasized the need to keep to 2000 mg Na diet with 1-2 L (max) of all liquids. Ok to use spices and herbs such as garlic, ok to use vinegar, hot sauce and mustard to flavor foods.   She is encouraged to obtain scales and do daily wts. That way, she could take prn extra Lasix based on weight gain and keep the daily dose low.   2. CRF: She has mult CRFs, but her LDL was 71 when last checked, continue statin. No ischemic sx, EF nl a year ago, do not feel further testing needed at this time.   3. Sinus bradycardia: pt formerly on labetalol, this was changed to metoprolol at 01/22 visit because of concerns over worsening renal function.  Her heart rate was in the 60s at that time, but is now 48.  Some of her symptoms may be coming from this, I will decrease the dose of the metoprolol back to 50 mg twice daily.  4.  Hypertension: Her blood pressure is well controlled at this time.  Follow this on the decreased dose of metoprolol and intermittent increased Lasix.   Current medicines are reviewed at length with the patient today.  The patient does not have concerns regarding medicines.  The following changes have been made: Decrease metoprolol increase Lasix as directed  Labs/ tests ordered today include:   Orders Placed This Encounter  Procedures  . EKG 12-Lead  . ECHOCARDIOGRAM COMPLETE     Disposition:  FU with Dr. Ellyn Hack  Signed, Rosaria Ferries, PA-C  03/23/2017 5:38 PM    [place] Phone: [phone]; Fax: [phone]  This note was written with the assistance of speech recognition software. Please excuse any transcriptional errors.

## 2017-03-24 ENCOUNTER — Ambulatory Visit: Payer: PPO | Admitting: Pulmonary Disease

## 2017-03-24 ENCOUNTER — Other Ambulatory Visit: Payer: Self-pay

## 2017-03-24 ENCOUNTER — Encounter: Payer: Self-pay | Admitting: Pulmonary Disease

## 2017-03-24 VITALS — BP 120/74 | HR 55 | Ht 68.0 in | Wt 340.0 lb

## 2017-03-24 DIAGNOSIS — J4530 Mild persistent asthma, uncomplicated: Secondary | ICD-10-CM

## 2017-03-24 DIAGNOSIS — G4733 Obstructive sleep apnea (adult) (pediatric): Secondary | ICD-10-CM

## 2017-03-24 DIAGNOSIS — Z9989 Dependence on other enabling machines and devices: Secondary | ICD-10-CM

## 2017-03-24 NOTE — Progress Notes (Signed)
[facility]  Chief Complaint  Patient presents with  . Follow-up    Pt is doing well overall with cpap machine. Pt has SOB with exertion, pt having swelling in legs.    Vital signs: BP 120/74 (BP Location: Left Arm, Cuff Size: Normal)   Pulse (!) 55   Ht 5\' 8"  (1.727 m)   Wt (!) 340 lb (154.2 kg)   SpO2 98%   BMI 51.70 kg/m   History of Present Illness: Stacey Page is a 67 y.o. female former smoker with asthma, and OSA.  She has history of MAI.  She was having more trouble with her breathing.  She was seen by cardiology.  Lasix increased and this helped.    She is not having cough, wheeze, or sputum.  She denies fever or chest pain.  Uses singulair daily.  Uses nasonex daily.  Only needs symbicort and albuterol in the Summer.  Uses CPAP nightly.  Mask fits okay.  No issues with mouth dryness.  Physical Exam:  General - pleasant Eyes - pupils reactive ENT - no sinus tenderness, no oral exudate, no LAN, MP 4 Cardiac - regular, no murmur Chest - no wheeze, rales Abd - soft, non tender Ext - no edema Skin - no rashes Neuro - normal strength Psych - normal mood   Assessment/Plan:  Asthma. - continue singulair - she uses symbicort and albuterol during the Summer months  OSA. - she is compliant with CPAP and reports benefit - continue auto CPAP   Diastolic CHF. - recent symptoms of dyspnea improved after increasing lasix - f/u Echo ordered with cardiology   Patient Instructions  Follow up in 1 year   Chesley Mires, MD [place] 03/24/2017, 10:09 AM Pager:  [phone]  Flow Sheet  Pulmonary tests: Echo 01/28/14 >> severe LVH, EF 65 to 08%, grade 2 diastolic dysfx, mild AS, mild/mod LA. PFT 03/12/14 >> FEV1 2.05 (88%), FEV1% 87, TLC 4.14 (75%), DLCO 52%, no BD HRCT chest 03/20/14 >> multiple small nodules, patchy GGO, mild BTX, air trapping VATS lung bx 06/10/14 >> bronchiolitis with non  necrotizing granuloma and AFB  Sleep tests HST 02/12/14 >> AHI 59.3, SaO2 low 67% Auto CPAP 02/21/17 to 03/22/17 >> used on 30 of 30 nights with average 6 hrs 25 min.  Average AHI 3.5 with median CPAP 10 and 95 th percentile CPAP 13 cm H2O  Cardiac tests Echo 12/09/15 >> mod LVH, EF 65 to 70%, grade 2 DD  Past Medical History: She  has a past medical history of Anxiety, Arthritis of right knee (03/14/2016), Asthma, CHF (congestive heart failure) ([place]), COPD (chronic obstructive pulmonary disease) ([place]), Depression, Diabetes ([place]), Eczema, GERD (gastroesophageal reflux disease), History of bronchitis as a child, HTN (hypertension), Insomnia, Joint pain, Joint swelling, OA (osteoarthritis), OSA (obstructive sleep apnea) (02/25/2014), Peripheral neuropathy, Pneumonia, and Seasonal allergies.  Past Surgical History: She  has a past surgical history that includes Cesarean section (x2); Polypectomy; Knee arthroscopy (Right); Video assisted thoracoscopy (Right, 06/10/2014); Lung biopsy (Right, 06/10/2014); Colonoscopy; Total knee arthroplasty (Right, 03/14/2016); and Joint replacement.  Family History: Her family history includes Anemia in her father; COPD in her father; Cancer (age of onset: 9) in her mother; Hypothyroidism in her father.  Social History: She  reports that she quit smoking about 37 years ago. Her smoking use included cigarettes. She has a 3.75 pack-year smoking history. She has quit using smokeless tobacco. She reports that she drinks alcohol. She reports that  she does not use drugs.  Medications: Allergies as of 03/24/2017      Reactions   No Known Allergies       Medication List        Accurate as of 03/24/17 10:09 AM. Always use your most recent med list.          albuterol 108 (90 Base) MCG/ACT inhaler Commonly known as:  PROAIR HFA Inhale 2 puffs into the lungs every 6 (six) hours as needed for wheezing or shortness of breath.   aspirin EC 81 MG tablet Take 1 tablet (81 mg  total) by mouth daily with breakfast.   atorvastatin 20 MG tablet Commonly known as:  LIPITOR Take 1 tablet (20 mg total) by mouth daily at 6 PM.   CALCIUM 600+D 600-400 MG-UNIT tablet Generic drug:  Calcium Carbonate-Vitamin D Take 1 tablet by mouth daily.   cholecalciferol 1000 units tablet Commonly known as:  VITAMIN D Take 1,000 Units by mouth daily.   Clobetasol Prop Emollient Base 0.05 % emollient cream Commonly known as:  CLOBETASOL PROPIONATE E Apply 1 application topically daily as needed (skin irritations).   FEROSUL 325 (65 FE) MG tablet Generic drug:  ferrous sulfate Take 325 mg by mouth daily with breakfast.   furosemide 20 MG tablet Commonly known as:  LASIX Take 1 tablet (20 mg total) by mouth daily. Take 1 extra tablet on Monday and Thursdays   lisinopril 20 MG tablet Commonly known as:  PRINIVIL,ZESTRIL Take 1 tablet (20 mg total) by mouth daily.   metoprolol tartrate 50 MG tablet Commonly known as:  LOPRESSOR Take 1 tablet (50 mg total) by mouth 2 (two) times daily.   mometasone 50 MCG/ACT nasal spray Commonly known as:  NASONEX Place 2 sprays into the nose daily.   montelukast 10 MG tablet Commonly known as:  SINGULAIR TAKE 1 TABLET (10 MG TOTAL) BY MOUTH AT BEDTIME.   sitaGLIPtin 100 MG tablet Commonly known as:  JANUVIA Take 1 tablet (100 mg total) by mouth daily.

## 2017-03-24 NOTE — Patient Instructions (Signed)
Follow up in 1 year.

## 2017-03-31 ENCOUNTER — Ambulatory Visit (INDEPENDENT_AMBULATORY_CARE_PROVIDER_SITE_OTHER): Payer: PPO | Admitting: Nurse Practitioner

## 2017-03-31 ENCOUNTER — Other Ambulatory Visit: Payer: Self-pay

## 2017-03-31 ENCOUNTER — Encounter: Payer: Self-pay | Admitting: Nurse Practitioner

## 2017-03-31 ENCOUNTER — Ambulatory Visit (HOSPITAL_COMMUNITY): Payer: PPO | Attending: Cardiovascular Disease

## 2017-03-31 VITALS — BP 124/80 | HR 53 | Temp 98.0°F | Ht 68.0 in | Wt 341.0 lb

## 2017-03-31 DIAGNOSIS — I10 Essential (primary) hypertension: Secondary | ICD-10-CM | POA: Diagnosis not present

## 2017-03-31 DIAGNOSIS — R202 Paresthesia of skin: Secondary | ICD-10-CM

## 2017-03-31 DIAGNOSIS — I5032 Chronic diastolic (congestive) heart failure: Secondary | ICD-10-CM

## 2017-03-31 DIAGNOSIS — E1141 Type 2 diabetes mellitus with diabetic mononeuropathy: Secondary | ICD-10-CM

## 2017-03-31 DIAGNOSIS — R011 Cardiac murmur, unspecified: Secondary | ICD-10-CM | POA: Diagnosis not present

## 2017-03-31 DIAGNOSIS — N183 Chronic kidney disease, stage 3 unspecified: Secondary | ICD-10-CM

## 2017-03-31 DIAGNOSIS — I082 Rheumatic disorders of both aortic and tricuspid valves: Secondary | ICD-10-CM | POA: Insufficient documentation

## 2017-03-31 DIAGNOSIS — I110 Hypertensive heart disease with heart failure: Secondary | ICD-10-CM | POA: Diagnosis present

## 2017-03-31 LAB — ECHOCARDIOGRAM COMPLETE
AO mean calculated velocity dopler: 169 cm/s
AOVTI: 51.9 cm
AV Area VTI index: 0.81 cm2/m2
AV Mean grad: 13 mmHg
AV Peak grad: 28 mmHg
AV pk vel: 265 cm/s
AVAREAMEANV: 1.98 cm2
AVAREAMEANVIN: 0.71 cm2/m2
AVAREAVTI: 1.81 cm2
AVCELMEANRAT: 0.44
Ao pk vel: 0.4 m/s
Ao-asc: 32 cm
CHL CUP AV PEAK INDEX: 0.64
CHL CUP AV VEL: 2.29
CHL CUP DOP CALC LVOT VTI: 26.3 cm
E decel time: 299 msec
E/e' ratio: 18.23
FS: 47 % — AB (ref 28–44)
HEIGHTINCHES: 68 in
IV/PV OW: 0.78
LA ID, A-P, ES: 46 mm
LA diam end sys: 46 mm
LA diam index: 1.63 cm/m2
LV E/e' medial: 18.23
LV E/e'average: 18.23
LV TDI E'LATERAL: 6.31
LV e' LATERAL: 6.31 cm/s
LVOT SV: 119 mL
LVOT area: 4.52 cm2
LVOT diameter: 24 mm
LVOTPV: 106 cm/s
LVOTVTI: 0.51 cm
Lateral S' vel: 12.1 cm/s
MV Dec: 299
MV Peak grad: 5 mmHg
MVPKAVEL: 101 m/s
MVPKEVEL: 115 m/s
PW: 18 mm — AB (ref 0.6–1.1)
RV TAPSE: 28.5 mm
Reg peak vel: 232 cm/s
TDI e' medial: 5
TRMAXVEL: 232 cm/s
Valve area index: 0.81
Valve area: 2.29 cm2
WEIGHTICAEL: 5456 [oz_av]

## 2017-03-31 LAB — BASIC METABOLIC PANEL
BUN: 22 mg/dL (ref 6–23)
CALCIUM: 10.1 mg/dL (ref 8.4–10.5)
CO2: 31 meq/L (ref 19–32)
Chloride: 103 mEq/L (ref 96–112)
Creatinine, Ser: 1.3 mg/dL — ABNORMAL HIGH (ref 0.40–1.20)
GFR: 52.81 mL/min — AB (ref >60.00)
Glucose, Bld: 99 mg/dL (ref 70–99)
Potassium: 4.5 mEq/L (ref 3.5–5.1)
SODIUM: 142 meq/L (ref 135–145)

## 2017-03-31 LAB — BRAIN NATRIURETIC PEPTIDE: PRO B NATRI PEPTIDE: 102 pg/mL — AB (ref 0.0–100.0)

## 2017-03-31 MED ORDER — MISC. DEVICES MISC: 1.0000 [IU] | Freq: Every day | 0 refills | Status: DC

## 2017-03-31 MED ORDER — SITAGLIPTIN PHOSPHATE 100 MG PO TABS: 100.0000 mg | ORAL_TABLET | Freq: Every day | ORAL | 0 refills | Status: DC

## 2017-03-31 NOTE — Assessment & Plan Note (Signed)
Seen by cardiology: Dr. Ahmed Prima 03/23/2017:  Metoprolol and furosemide dose change. Emphasized the need to keep to 2000 mg Na diet with 1-2 L (max) of all liquids. Wt Readings from Last 3 Encounters:  03/31/17 (!) 341 lb (154.7 kg)  03/24/17 (!) 340 lb (154.2 kg)  03/23/17 (!) 342 lb (155.1 kg)

## 2017-03-31 NOTE — Progress Notes (Signed)
Subjective:  Patient ID: Stacey Stacey [name] Stacey [name], female    DOB: December 21, 1950  Age: 67 y.o. MRN: [id number]  CC: Follow-up (1 mo fu  weight loss, DM, CHF and HTN (fasting)-62mins appt/ fasting/diabetic shoes consult?tdap?heart doctor wants blood work but pt wants to wait and do it all today?)  HPI  DM: Does not check glucose at home. Needs temporary januvia prescription. [facility] script has delayed delivery and she is out of her medications. Persistent tingling of both feet. Worse at night. Denies any open wounds. Reports dry skin. She will like diabetic shoe prescription.  HTN: Stable with metoprolol and lisinopril. Metoprolol dose decreased due to bradycardia BP Readings from Last 3 Encounters:  03/31/17 124/80  03/24/17 120/74  03/23/17 118/76   CHF: Stable weight. Persistent SOB with exertion, no chest pain, no dizziness, no palpitation. Currently managed by cardiology and dietician. Furosemide and metoprolol dose adjusted. Wt Readings from Last 3 Encounters:  03/31/17 (!) 341 lb (154.7 kg)  03/24/17 (!) 340 lb (154.2 kg)  03/23/17 (!) 342 lb (155.1 kg)   Outpatient Medications Prior to Visit  Medication Sig Dispense Refill  . albuterol (PROAIR HFA) 108 (90 Base) MCG/ACT inhaler Inhale 2 puffs into the lungs every 6 (six) hours as needed for wheezing or shortness of breath. 1 Inhaler 3  . aspirin 81 MG tablet Take 1 tablet (81 mg total) by mouth daily with breakfast. 90 tablet 0  . atorvastatin (LIPITOR) 20 MG tablet Take 1 tablet (20 mg total) by mouth daily at 6 PM. 90 tablet 1  . Calcium Carbonate-Vitamin D (CALCIUM 600+D) 600-400 MG-UNIT tablet Take 1 tablet by mouth daily.    . cholecalciferol (VITAMIN D) 1000 units tablet Take 1,000 Units by mouth daily.    . Clobetasol Prop Emollient Base (CLOBETASOL PROPIONATE E) 0.05 % emollient cream Apply 1 application topically daily as needed (skin irritations). 30 g 2  . ferrous sulfate (FEROSUL) 325 (65 FE) MG tablet Take 325 mg by  mouth daily with breakfast.    . furosemide (LASIX) 20 MG tablet Take 1 tablet (20 mg total) by mouth daily. Take 1 extra tablet on Monday and Thursdays 90 tablet 1  . lisinopril (PRINIVIL,ZESTRIL) 20 MG tablet Take 1 tablet (20 mg total) by mouth daily. 90 tablet 1  . metoprolol tartrate (LOPRESSOR) 50 MG tablet Take 1 tablet (50 mg total) by mouth 2 (two) times daily. 60 tablet 5  . mometasone (NASONEX) 50 MCG/ACT nasal spray Place 2 sprays into the nose daily. 17 g 11  . montelukast (SINGULAIR) 10 MG tablet TAKE 1 TABLET (10 MG TOTAL) BY MOUTH AT BEDTIME. 90 tablet 1  . sitaGLIPtin (JANUVIA) 100 MG tablet Take 1 tablet (100 mg total) by mouth daily. (Patient not taking: Reported on 03/31/2017) 90 tablet 1   No facility-administered medications prior to visit.     ROS See HPI  Objective:  BP 124/80   Pulse (!) 53   Temp 98 F (36.7 C)   Ht 5\' 8"  (1.727 m)   Wt (!) 341 lb (154.7 kg)   SpO2 94%   BMI 51.85 kg/m   BP Readings from Last 3 Encounters:  03/31/17 124/80  03/24/17 120/74  03/23/17 118/76    Wt Readings from Last 3 Encounters:  03/31/17 (!) 341 lb (154.7 kg)  03/24/17 (!) 340 lb (154.2 kg)  03/23/17 (!) 342 lb (155.1 kg)    Physical Exam  Constitutional: She is oriented to person, place, and time. No distress.  Cardiovascular: Normal  rate and regular rhythm.  Murmur heard. Pulmonary/Chest: Effort normal and breath sounds normal. No respiratory distress. She has no rales.  Musculoskeletal: She exhibits no edema.  Neurological: She is alert and oriented to person, place, and time.  Vitals reviewed.   Lab Results  Component Value Date   WBC 5.0 07/15/2016   HGB 12.9 07/15/2016   HCT 38.9 07/15/2016   PLT 210.0 07/15/2016   GLUCOSE 99 03/31/2017   CHOL 146 01/19/2017   TRIG 67.0 01/19/2017   HDL 62.10 01/19/2017   LDLCALC 71 01/19/2017   ALT 12 01/19/2017   AST 17 01/19/2017   NA 142 03/31/2017   K 4.5 03/31/2017   CL 103 03/31/2017   CREATININE  1.30 (H) 03/31/2017   BUN 22 03/31/2017   CO2 31 03/31/2017   TSH 0.81 01/19/2017   INR 1.11 06/16/2014   HGBA1C 5.9 01/19/2017    Dg Bone Density  Result Date: 01/24/2017 EXAM: DUAL X-RAY ABSORPTIOMETRY (DXA) FOR BONE MINERAL DENSITY IMPRESSION: Referring Physician:  Charlene Stacey [name] [name] PATIENT: Name: Stacey Stacey [name], Stacey [name] Patient ID: [id number] Birth Date: Aug 13, 1950 Height: 66.5 in. Sex: Female Measured: 01/24/2017 Weight: 330.0 lbs. Indications: African-American, Diabetic, Estrogen Deficiency, Post Menopausal, Previous Tobacco User Fractures: Treatments: Januvia, Prednisone ASSESSMENT: The BMD measured at Femur Total Right is 0.836 g/cm2 with a T-score of -1.4. This patient is considered osteopenic according to [facility]) criteria. L- 4 was excluded due to degenerative changes. Site Region Measured Date Measured Age WHO YA BMD Classification T-score AP Spine L1-L3 01[age]24/2017 65.1 Normal 1.5 1.355 g/cm2 DualFemur Total Right 2/24/2017 65.1 [age] Osteopenia -1.4 0.836 g/cm2 World Health Organization [facility]) criteria for post-menopausal, Caucasian Women: Normal       T-score at or above -1 SD Osteopenia   T-score between -1 and -2.5 SD Osteoporosis T-score at or below -2.5 SD RECOMMENDATION: [place] recommends that FDA-approved medical therapies be considered in postmenopausal women and men age 6 or older with a: 1. Hip or vertebral (clinical or morphometric) fracture. 2. T-score of < -2.5 at the spine or hip. 3. Ten-year fracture probability by FRAX of 3% or greater for hip fracture or 20% or greater for major osteoporotic fracture. All treatment decisions require clinical judgment and consideration of individual patient factors, including patient preferences, co-morbidities, previous drug use, risk factors not captured in the FRAX model (e.g. falls, vitamin D deficiency, increased bone turnover, interval significant decline in bone density) and possible under -  or over-estimation of fracture risk by FRAX. All patients should ensure an adequate intake of dietary calcium (1200 mg/d) and vitamin D (800 IU daily) unless contraindicated. FOLLOW-UP: People with diagnosed cases of osteoporosis or at high risk for fracture should have regular bone mineral density tests. For patients eligible for Medicare, routine testing is allowed once every 2 years. The testing frequency can be increased to one year for patients who have rapidly progressing disease, those who are receiving or discontinuing medical therapy to restore bone mass, or have additional risk factors. I have reviewed this report and agree with the above findings. [facility] Patient: Stacey Stacey [name] Stacey [name]   Referring Physician: Charlene Stacey [name] [name] Birth Date: 03/20/1950 [age] Patient ID: [id number] Height: 66.5 in. Weight: 330.0 lbs. Measured: 01/24/2017 8:35:44 AM (16 SP 2) Sex: Female Ethnicity: African-American Analyzed: 01/24/2017 8:36:47 AM (16 SP 2) FRAX* 10-year Probability of Fracture Based on femoral neck BMD: DualFemur (Right) Major Osteoporotic Fracture: 2.9% Hip Fracture:  0.2% Population:                  [place] (Black) Risk Factors:                None *FRAX is a Materials engineer of the [employer] for Metabolic Bone Disease, a [place] (WHO) [facility]. ASSESSMENT: The probability of a major osteoporotic fracture is 2.9% within the next ten years. The probability of a hip fracture is 0.2% within the next ten years. Electronically Signed   By: Marijo Conception, M.D.   On: 01/24/2017 10:14    Assessment & Plan:   Stacey Stacey [name] Stacey [name] was seen today for follow-up.  Diagnoses and all orders for this visit:  Type 2 diabetes mellitus with diabetic mononeuropathy, without long-term current use of insulin (HCC) -     sitaGLIPtin (JANUVIA) 100 MG tablet; Take 1 tablet (100 mg total) by mouth daily. -     Misc. Devices MISC; 1 Units by  Does not apply route daily. Dispensed 1pair of Diabetic shoe. Dx. Code: E11.41 and R20.2  Paresthesia of both feet -     Misc. Devices MISC; 1 Units by Does not apply route daily. Dispensed 1pair of Diabetic shoe. Dx. Code: E11.41 and M19.6  Chronic diastolic heart failure (HCC) -     Basic metabolic panel -     B Nat Peptide  CKD (chronic kidney disease) stage 3, GFR 30-59 ml/min (HCC) -     Basic metabolic panel -     B Nat Peptide   I am having Stacey Stacey [name] Stacey [name] start on Misc. Devices. I am also having her maintain her Clobetasol Prop Emollient Base, albuterol, aspirin EC, mometasone, montelukast, atorvastatin, lisinopril, Calcium Carbonate-Vitamin D, ferrous sulfate, cholecalciferol, furosemide, metoprolol tartrate, and sitaGLIPtin.  Meds ordered this encounter  Medications  . sitaGLIPtin (JANUVIA) 100 MG tablet    Sig: Take 1 tablet (100 mg total) by mouth daily.    Dispense:  14 tablet    Refill:  0    Order Specific Question:   Supervising Provider    Answer:   Lucille Passy [[id number]]  . Misc. Devices MISC    Sig: 1 Units by Does not apply route daily. Dispensed 1pair of Diabetic shoe. Dx. Code: E11.41 and R20.2    Dispense:  1 each    Refill:  0    Order Specific Question:   Supervising Provider    Answer:   Lucille Passy [[id number]]    Follow-up: Return in about 3 months (around 06/28/2017) for DM and HTN.  Wilfred Lacy, NP

## 2017-03-31 NOTE — Patient Instructions (Addendum)
Improved potassium and BNP Persistent decline and stable renal function. Maintain f/up with cardiology. Will repeat labs in 84month at your appt with me.  Per cardiology: Emphasized the need to keep to 2000 mg Na diet with 1-2 L (max) of all liquids.

## 2017-04-03 ENCOUNTER — Encounter: Payer: Self-pay | Admitting: Nurse Practitioner

## 2017-04-04 ENCOUNTER — Telehealth: Payer: Self-pay | Admitting: Cardiology

## 2017-04-04 NOTE — Telephone Encounter (Signed)
Follow Up   Per pt returning phone call regarding test results. Requesting call back

## 2017-04-07 DIAGNOSIS — I350 Nonrheumatic aortic (valve) stenosis: Secondary | ICD-10-CM

## 2017-04-07 HISTORY — DX: Nonrheumatic aortic (valve) stenosis: I35.0

## 2017-04-20 ENCOUNTER — Encounter: Payer: Self-pay | Admitting: Cardiology

## 2017-04-20 ENCOUNTER — Ambulatory Visit: Payer: PPO | Admitting: Cardiology

## 2017-04-20 VITALS — BP 163/88 | HR 59 | Ht 68.0 in | Wt 345.0 lb

## 2017-04-20 DIAGNOSIS — E785 Hyperlipidemia, unspecified: Secondary | ICD-10-CM | POA: Diagnosis not present

## 2017-04-20 DIAGNOSIS — I110 Hypertensive heart disease with heart failure: Secondary | ICD-10-CM | POA: Diagnosis not present

## 2017-04-20 DIAGNOSIS — I10 Essential (primary) hypertension: Secondary | ICD-10-CM

## 2017-04-20 DIAGNOSIS — I5032 Chronic diastolic (congestive) heart failure: Secondary | ICD-10-CM

## 2017-04-20 DIAGNOSIS — G4733 Obstructive sleep apnea (adult) (pediatric): Secondary | ICD-10-CM | POA: Diagnosis not present

## 2017-04-20 MED ORDER — CARVEDILOL 12.5 MG PO TABS: 12.5000 mg | ORAL_TABLET | Freq: Two times a day (BID) | ORAL | 3 refills | Status: DC

## 2017-04-20 MED ORDER — FUROSEMIDE 20 MG PO TABS: 20.0000 mg | ORAL_TABLET | Freq: Every day | ORAL | 3 refills | Status: DC

## 2017-04-20 NOTE — Patient Instructions (Signed)
MEDICATION INSTRUCTIONS  STOP METOPROLOL  START CARVEDILOL 12.5 MG TWICE A DAY   FUROSEMIDE 20 ME DAILY , BUT ON Monday ,Wednesday , Friday  Take an extra 20 mg furosemide  ( all together or take 2nd dose 6 hours from the first.      Your physician recommends that you schedule a follow-up appointment in 6 TO 8 WEEKS  with Suanne Marker PA     Your physician wants you to follow-up in 4-6 [place].You will receive a reminder letter in the mail two months in advance. If you don't receive a letter, please call our office to schedule the follow-up appointment.

## 2017-04-20 NOTE — Progress Notes (Signed)
PCP: Flossie Buffy, NP  Clinic Note: Chief Complaint  Patient presents with  . Shortness of Breath    frequently.  . Follow-up    1 month;    HPI: Stacey Page is a 67 y.o. female with a PMH below who presents today for one-month follow-up having seen Rosaria Ferries, PA-C. Morbidly obese woman with hypertension, hyperlipidemia and diabetes along with OSA on CPAP as well as asthma and reported diastolic heart failure.  Stacey Page was initially seen on March 23, 2017 by Rosaria Ferries, PA CHF evaluation of CHF with complaints of weight gain and shortness of breath.  Had gained 40 pounds since February 2018 time knee surgery.  Was having difficulty changing eating habits.  Noted exertional dyspnea with walking and feels tired all the time.  Indicates that she is compliant with CPAP.  ->  Thought to be mildly overloaded from a volume standpoint.    2D echo ordered  Told to increase Lasix to double dose for 2 days a week.  Discussed dietary discretion -Daily weights.   Converted from labetalol to metoprolol (presumably because of concern about renal function).  Dose reduced to 50 mg twice daily --> because of concerns of bradycardia.  Recent Hospitalizations: None  Studies Personally Reviewed - (if available, images/films reviewed: From Epic Chart or Care Everywhere)  2D Echo April 11, 2017: EF 60-65% moderate concentric LVH GRII DD.  Mild aortic stenosis (mean gradient 15 mmHg, peak gradient 28 mmHg).  Interval History: Stacey Page presents for one-month follow-up pretty much remained the same amount that he did last time she was seen.  She still has lots of puffy swelling and has  tingling sensations in her feet.  She occasionally notes a little dizziness. She says her swelling goes up and down and up and down as does her weight.  She admittedly has not been as good about watching her diet as she is supposed to. She cannot sleep lying flat, and sometimes sleeps on  several pillows or in a chair.  She denies any PND however. No rapid irregular heartbeats palpitations.  No chest pain or pressure with rest or exertion, but she does have exertional dyspnea.  In fact she is pretty much short of breath all the time.  Any activity will make her short of breath. She denies any TIA or amaurosis fugax symptoms.  No syncope or near syncope.  She really does not walk enough to get claudication.  ROS: A comprehensive was performed. Review of Systems  Constitutional: Negative for weight loss (In fact she gained).  HENT: Negative for congestion and nosebleeds.   Respiratory: Positive for shortness of breath. Negative for cough and wheezing.   Gastrointestinal: Negative for abdominal pain, blood in stool and melena.  Genitourinary: Negative for hematuria.  Musculoskeletal: Positive for joint pain (Postop knee surgery pain).  Neurological: Positive for dizziness.  Psychiatric/Behavioral: Negative for depression and memory loss. The patient is not nervous/anxious and does not have insomnia.   All other systems reviewed and are negative.  I have reviewed and (if needed) personally updated the patient's problem list, medications, allergies, past medical and surgical history, social and family history.   Past Medical History:  Diagnosis Date  . Anxiety    doesn't take any meds  . Arthritis of right knee 03/14/2016  . Asthma   . Chronic diastolic CHF (congestive heart failure) (HCC)    HF with Preserved EF (60-65%) - Grade II Diastolic Dysfunction (Hypertensive Heart Disease). takes Furosemide daily  .  COPD (chronic obstructive pulmonary disease) (HCC)    Albuterol as needed  . Depression    doesn't take meds  . Diabetes ([place])    takes Januvia daily  . Eczema    uses cream as needed  . GERD (gastroesophageal reflux disease)   . History of bronchitis as a child   . HTN (hypertension)    takes Lisinopril and Labetalol daily  . Insomnia   . Mild aortic stenosis by  prior echocardiogram 04/2017   Mild stenosis: Mean gradient 15 mmHg, peak gradient 28 mmHg  . OA (osteoarthritis)   . OSA (obstructive sleep apnea) 02/25/2014   wears CPAP at night  . Peripheral neuropathy    takes Gabapentin as needed  . Pneumonia    hx of-2010  . Seasonal allergies    uses Flonase daily    Past Surgical History:  Procedure Laterality Date  . CESAREAN SECTION  x2  . COLONOSCOPY    . JOINT REPLACEMENT    . KNEE ARTHROSCOPY Right   . LUNG BIOPSY Right 06/10/2014   Procedure: LUNG BIOPSY;  Surgeon: Ivin Poot, MD;  Location: [facility];  Service: Thoracic;  Laterality: Right;  . POLYPECTOMY     throat  . TOTAL KNEE ARTHROPLASTY Right 03/14/2016   Procedure: RIGHT TOTAL KNEE ARTHROPLASTY;  Surgeon: Marybelle Killings, MD;  Location: [facility];  Service: Orthopedics;  Laterality: Right;  . TRANSTHORACIC ECHOCARDIOGRAM  04/11/2017    EF 60-65% moderate concentric LVH GRII DD.  Mild aortic stenosis (mean gradient 15 mmHg, peak gradient 28 mmHg).  . VIDEO ASSISTED THORACOSCOPY Right 06/10/2014   Procedure: VIDEO ASSISTED THORACOSCOPY;  Surgeon: Ivin Poot, MD;  Location: [facility] OR;  Service: Thoracic;  Laterality: Right;    Current Meds  Medication Sig  . albuterol (PROAIR HFA) 108 (90 Base) MCG/ACT inhaler Inhale 2 puffs into the lungs every 6 (six) hours as needed for wheezing or shortness of breath.  Marland Kitchen aspirin 81 MG tablet Take 1 tablet (81 mg total) by mouth daily with breakfast.  . atorvastatin (LIPITOR) 20 MG tablet Take 1 tablet (20 mg total) by mouth daily at 6 PM.  . Calcium Carbonate-Vitamin D (CALCIUM 600+D) 600-400 MG-UNIT tablet Take 1 tablet by mouth daily.  . cholecalciferol (VITAMIN D) 1000 units tablet Take 1,000 Units by mouth daily.  . Clobetasol Prop Emollient Base (CLOBETASOL PROPIONATE E) 0.05 % emollient cream Apply 1 application topically daily as needed (skin irritations).  . ferrous sulfate (FEROSUL) 325 (65 FE) MG tablet Take 325 mg by mouth daily with  breakfast.  . furosemide (LASIX) 20 MG tablet Take 1 tablet (20 mg total) by mouth daily. Take 1 extra tablet on Monday and Wednesday and Fridays  . lisinopril (PRINIVIL,ZESTRIL) 20 MG tablet Take 1 tablet (20 mg total) by mouth daily.  . Misc. Devices MISC 1 Units by Does not apply route daily. Dispensed 1pair of Diabetic shoe. Dx. Code: E11.41 and R20.2  . mometasone (NASONEX) 50 MCG/ACT nasal spray Place 2 sprays into the nose daily.  . montelukast (SINGULAIR) 10 MG tablet TAKE 1 TABLET (10 MG TOTAL) BY MOUTH AT BEDTIME.  . sitaGLIPtin (JANUVIA) 100 MG tablet Take 1 tablet (100 mg total) by mouth daily.  . [DISCONTINUED] furosemide (LASIX) 20 MG tablet Take 1 tablet (20 mg total) by mouth daily. Take 1 extra tablet on Monday and Thursdays  . [DISCONTINUED] metoprolol tartrate (LOPRESSOR) 50 MG tablet Take 1 tablet (50 mg total) by mouth 2 (two) times daily.  Allergies  Allergen Reactions  . No Known Allergies     Social History   Tobacco Use  . Smoking status: Former Smoker    Packs/day: 0.25    Years: 15.00    Pack years: 3.75    Types: Cigarettes    Last attempt to quit: 02/08/1980    Years since quitting: 37.2  . Smokeless tobacco: Former Systems developer  . Tobacco comment: 2 packs per week  Substance Use Topics  . Alcohol use: Yes    Alcohol/week: 0.0 oz    Comment: occassional/social/rare  . Drug use: No   Social History   Social History Narrative  . Not on file    family history includes Anemia in her father; COPD in her father; Cancer (age of onset: 52) in her mother; Hypothyroidism in her father.  Wt Readings from Last 3 Encounters:  04/20/17 (!) 345 lb (156.5 kg)  03/31/17 (!) 341 lb (154.7 kg)  03/24/17 (!) 340 lb (154.2 kg)    PHYSICAL EXAM BP (!) 163/88   Pulse (!) 59   Ht 5\' 8"  (1.727 m)   Wt (!) 345 lb (156.5 kg)   BMI 52.46 kg/m  Physical Exam  Constitutional: She is oriented to person, place, and time. No distress.  Morbidly obese woman.   Well-groomed.  HENT:  Head: Normocephalic and atraumatic.  Neck: Normal range of motion. Neck supple. No hepatojugular reflux (Unable to assess) present. Carotid bruit is not present (Radiated aortic murmur).  Unable to assess due to body habitus  Cardiovascular: Normal rate, regular rhythm, S1 normal and S2 normal.  No extrasystoles are present. PMI is not displaced (Unable to assess). Exam reveals distant heart sounds and decreased pulses (More because of edema.  Difficult to palpate.). Exam reveals no gallop and no friction rub.  Murmur heard.  Harsh crescendo-decrescendo midsystolic murmur is present with a grade of 2/6 at the upper right sternal border radiating to the neck. Pulmonary/Chest: Effort normal and breath sounds normal. She has no wheezes. She has no rales.  Increased work of breathing, but nondistressed.  Abdominal: Soft. Bowel sounds are normal. She exhibits no distension.  Morbidly obese.  Unable to assess HSM  Musculoskeletal: Normal range of motion. She exhibits edema (1+ bilateral -more puffy and not pitting).  Neurological: She is alert and oriented to person, place, and time.  Psychiatric: She has a normal mood and affect. Her behavior is normal. Thought content normal.  Nursing note and vitals reviewed.   Adult ECG Report Not checked  Other studies Reviewed: Additional studies/ records that were reviewed today include:  Recent Labs:   Lab Results  Component Value Date   CREATININE 1.30 (H) 03/31/2017   BUN 22 03/31/2017   NA 142 03/31/2017   K 4.5 03/31/2017   CL 103 03/31/2017   CO2 31 03/31/2017   Lab Results  Component Value Date   CHOL 146 01/19/2017   HDL 62.10 01/19/2017   LDLCALC 71 01/19/2017   TRIG 67.0 01/19/2017   CHOLHDL 2 01/19/2017    ASSESSMENT / PLAN: Problem List Items Addressed This Visit    OSA (obstructive sleep apnea)   Morbid obesity (HCC) (Chronic)    Perhaps her major issue is related to her obesity.  Edema probably made  worse by OSA and possibly OHS.  We discussed dietary modification and need for exercise.      Hypertensive heart disease with chronic diastolic congestive heart failure ([place]) - Primary (Chronic)    Unfortunately, her weight is  now up.  Not really sure if she is truly volume up as the exam is very difficult to determine. I reiterated the importance of sodium restriction and dietary discretion. Needs more afterload reduction/blood pressure control.  Plan: Continue current dose of Lasix daily but double dose on Monday, Wednesday, Friday.  I will convert from metoprolol to carvedilol to give better blood pressure control (carvedilol 12.5 mg twice daily),   continue current dose of lisinopril.  If blood pressure still remains elevated would likely need to add amlodipine       Relevant Medications   carvedilol (COREG) 12.5 MG tablet   furosemide (LASIX) 20 MG tablet   Hyperlipidemia LDL goal <70 (Chronic)    Is on atorvastatin.  Significant improvement in LDL over the past year with LDL now down from 130- to 71.      Relevant Medications   carvedilol (COREG) 12.5 MG tablet   furosemide (LASIX) 20 MG tablet   Essential hypertension (Chronic)    Not adequately controlled.  Was converted from labetalol to metoprolol however I will convert to carvedilol and continue lisinopril.  Will probably need another agent.  If heart rate will tolerate, I would increase carvedilol, but I do not think I will be able to.  Probably need to add a dihydropyridine calcium channel blocker.  (If we do that, she probably needs to wear support stockings to avoid worsening edema.)      Relevant Medications   carvedilol (COREG) 12.5 MG tablet   furosemide (LASIX) 20 MG tablet      Current medicines are reviewed at length with the patient today. (+/- concerns) n/a The following changes have been made: see below   Patient Instructions  MEDICATION INSTRUCTIONS  STOP METOPROLOL  START CARVEDILOL 12.5 MG  TWICE A DAY   FUROSEMIDE 20 ME DAILY , BUT ON Monday ,Wednesday , Friday  Take an extra 20 mg furosemide  ( all together or take 2nd dose 6 hours from the first.      Your physician recommends that you schedule a follow-up appointment in 6 TO 8 WEEKS  with Suanne Marker PA     Your physician wants you to follow-up in 4-6 [place].You will receive a reminder letter in the mail two months in advance. If you don't receive a letter, please call our office to schedule the follow-up appointment.    Studies Ordered:   No orders of the defined types were placed in this encounter.     Glenetta Hew, M.D., M.S. Interventional Cardiologist   Pager # [phone] Phone # [phone] [address] [place], [place] [address]   Thank you for choosing [facility] at [facility]!!

## 2017-04-22 ENCOUNTER — Encounter: Payer: Self-pay | Admitting: Cardiology

## 2017-04-22 NOTE — Assessment & Plan Note (Signed)
Unfortunately, her weight is now up.  Not really sure if she is truly volume up as the exam is very difficult to determine. I reiterated the importance of sodium restriction and dietary discretion. Needs more afterload reduction/blood pressure control.  Plan: Continue current dose of Lasix daily but double dose on Monday, Wednesday, Friday.  I will convert from metoprolol to carvedilol to give better blood pressure control (carvedilol 12.5 mg twice daily),   continue current dose of lisinopril.  If blood pressure still remains elevated would likely need to add amlodipine

## 2017-04-22 NOTE — Assessment & Plan Note (Signed)
Not adequately controlled.  Was converted from labetalol to metoprolol however I will convert to carvedilol and continue lisinopril.  Will probably need another agent.  If heart rate will tolerate, I would increase carvedilol, but I do not think I will be able to.  Probably need to add a dihydropyridine calcium channel blocker.  (If we do that, she probably needs to wear support stockings to avoid worsening edema.)

## 2017-04-22 NOTE — Assessment & Plan Note (Signed)
Perhaps her major issue is related to her obesity.  Edema probably made worse by OSA and possibly OHS.  We discussed dietary modification and need for exercise.

## 2017-04-22 NOTE — Assessment & Plan Note (Addendum)
Is on atorvastatin.  Significant improvement in LDL over the past year with LDL now down from 130- to 71.

## 2017-04-28 DIAGNOSIS — G4733 Obstructive sleep apnea (adult) (pediatric): Secondary | ICD-10-CM | POA: Diagnosis not present

## 2017-05-05 ENCOUNTER — Other Ambulatory Visit: Payer: Self-pay | Admitting: Internal Medicine

## 2017-05-21 ENCOUNTER — Other Ambulatory Visit: Payer: Self-pay | Admitting: Internal Medicine

## 2017-06-08 ENCOUNTER — Encounter: Payer: Self-pay | Admitting: Physician Assistant

## 2017-06-08 ENCOUNTER — Ambulatory Visit: Payer: PPO | Admitting: Physician Assistant

## 2017-06-08 VITALS — BP 138/78 | HR 66 | Ht 68.0 in | Wt 346.0 lb

## 2017-06-08 DIAGNOSIS — I10 Essential (primary) hypertension: Secondary | ICD-10-CM

## 2017-06-08 DIAGNOSIS — Z79899 Other long term (current) drug therapy: Secondary | ICD-10-CM | POA: Diagnosis not present

## 2017-06-08 DIAGNOSIS — I5032 Chronic diastolic (congestive) heart failure: Secondary | ICD-10-CM

## 2017-06-08 NOTE — Progress Notes (Signed)
Cardiology Office Note   Date:  06/08/2017   ID:  Stacey Page, DOB 05-08-1950, MRN [id number]  PCP:  Flossie Buffy, NP  Cardiologist: Dr. Ellyn Hack, 04/22/2017 Rosaria Ferries, PA-C 03/23/2017  Chief Complaint  Patient presents with  . Follow-up    pt denies chest pains, swelling in hands/feet, pt does complain of SOB     History of Present Illness: Stacey Page is a 67 y.o. female with a history of  DM, HTN, depression, D-CHF, asthma, GERD, OSA on CPAP, OA  2/14 office visit, new patient for evaluation of CHF, weight 342 pounds, mildly volume overloaded, Lasix increased 2 days a week, echo ordered, dietary compliance emphasized, daily weights encouraged, heart rate 48 and metoprolol dose decreased 3/14 office visit, + orthopnea, no PND, weight 345 pounds dietary modifications and need for exercise discussed, increase Lasix dose on M-W-F, metoprolol changed to Coreg, lisinopril continued, may need to add amlodipine if blood pressure not well controlled.if BP not well controlled.  [name] presents for cardiology follow up.   Her breathing is better, she is trying to move more. It would help her to have a workout buddy. She is trying to get one. She is not waking w/ LE edema. She wakes during the night, thinks it is related to dry mouth since she drinks water and sx improve. Compliant w/ CPAP.   She is walking more and does well with this, just has to go slow due to SOB.   No chest pain.  No palpitations. No presyncope or syncope.   She generally feels good. She feels positive about the changes she is making. She is getting her family to go along , that will make it easier.   Past Medical History:  Diagnosis Date  . Anxiety    doesn't take any meds  . Arthritis of right knee 03/14/2016  . Asthma   . Chronic diastolic CHF (congestive heart failure) (HCC)    HF with Preserved EF (60-65%) - Grade II Diastolic Dysfunction (Hypertensive Heart Disease). takes  Furosemide daily  . COPD (chronic obstructive pulmonary disease) (HCC)    Albuterol as needed  . Depression    doesn't take meds  . Diabetes ([place])    takes Januvia daily  . Eczema    uses cream as needed  . GERD (gastroesophageal reflux disease)   . History of bronchitis as a child   . HTN (hypertension)   . Insomnia   . Mild aortic stenosis by prior echocardiogram 04/2017   Mild stenosis: Mean gradient 15 mmHg, peak gradient 28 mmHg  . OA (osteoarthritis)   . OSA (obstructive sleep apnea) 02/25/2014   wears CPAP at night  . Peripheral neuropathy    takes Gabapentin as needed  . Pneumonia    hx of-2010  . Seasonal allergies    uses Flonase daily    Past Surgical History:  Procedure Laterality Date  . CESAREAN SECTION  x2  . COLONOSCOPY    . JOINT REPLACEMENT    . KNEE ARTHROSCOPY Right   . LUNG BIOPSY Right 06/10/2014   Procedure: LUNG BIOPSY;  Surgeon: Ivin Poot, MD;  Location: [place];  Service: Thoracic;  Laterality: Right;  . POLYPECTOMY     throat  . TOTAL KNEE ARTHROPLASTY Right 03/14/2016   Procedure: RIGHT TOTAL KNEE ARTHROPLASTY;  Surgeon: Marybelle Killings, MD;  Location: [place];  Service: Orthopedics;  Laterality: Right;  . TRANSTHORACIC ECHOCARDIOGRAM  04/11/2017    EF 60-65% moderate concentric  LVH GRII DD.  Mild aortic stenosis (mean gradient 15 mmHg, peak gradient 28 mmHg).  . VIDEO ASSISTED THORACOSCOPY Right 06/10/2014   Procedure: VIDEO ASSISTED THORACOSCOPY;  Surgeon: Ivin Poot, MD;  Location: [facility] OR;  Service: Thoracic;  Laterality: Right;    Current Outpatient Medications  Medication Sig Dispense Refill  . albuterol (PROAIR HFA) 108 (90 Base) MCG/ACT inhaler Inhale 2 puffs into the lungs every 6 (six) hours as needed for wheezing or shortness of breath. 1 Inhaler 3  . aspirin 81 MG tablet Take 1 tablet (81 mg total) by mouth daily with breakfast. 90 tablet 0  . atorvastatin (LIPITOR) 20 MG tablet Take 1 tablet (20 mg total) by mouth daily at 6 PM. 90  tablet 1  . Calcium Carbonate-Vitamin D (CALCIUM 600+D) 600-400 MG-UNIT tablet Take 1 tablet by mouth daily.    . carvedilol (COREG) 12.5 MG tablet Take 1 tablet (12.5 mg total) by mouth 2 (two) times daily. 180 tablet 3  . cholecalciferol (VITAMIN D) 1000 units tablet Take 1,000 Units by mouth daily.    . Clobetasol Prop Emollient Base (CLOBETASOL PROPIONATE E) 0.05 % emollient cream Apply 1 application topically daily as needed (skin irritations). 30 g 2  . ferrous sulfate (FEROSUL) 325 (65 FE) MG tablet Take 325 mg by mouth daily with breakfast.    . furosemide (LASIX) 20 MG tablet Take 1 tablet (20 mg total) by mouth daily. Take 1 extra tablet on Monday and Wednesday and Fridays 126 tablet 3  . lisinopril (PRINIVIL,ZESTRIL) 20 MG tablet Take 1 tablet (20 mg total) by mouth daily. 90 tablet 1  . Misc. Devices MISC 1 Units by Does not apply route daily. Dispensed 1pair of Diabetic shoe. Dx. Code: E11.41 and R20.2 1 each 0  . mometasone (NASONEX) 50 MCG/ACT nasal spray Place 2 sprays into the nose daily. 17 g 11  . montelukast (SINGULAIR) 10 MG tablet TAKE 1 TABLET (10 MG TOTAL) BY MOUTH AT BEDTIME. 90 tablet 1  . sitaGLIPtin (JANUVIA) 100 MG tablet Take 1 tablet (100 mg total) by mouth daily. 14 tablet 0   No current facility-administered medications for this visit.     Allergies:   No known allergies    Social History:  The patient  reports that she quit smoking about 37 years ago. Her smoking use included cigarettes. She has a 3.75 pack-year smoking history. She has quit using smokeless tobacco. She reports that she drinks alcohol. She reports that she does not use drugs.   Family History:  The patient's family history includes Anemia in her father; COPD in her father; Cancer (age of onset: 29) in her mother; Hypothyroidism in her father.    ROS:  Please see the history of present illness. All other systems are reviewed and negative.    PHYSICAL EXAM: VS:  BP 138/78 (BP Location:  Left Arm)   Pulse 66   Ht 5\' 8"  (1.727 m)   Wt (!) 346 lb (156.9 kg)   BMI 52.61 kg/m  , BMI Body mass index is 52.61 kg/m. GEN: Well nourished, well developed, female in no acute distress  HEENT: normal for age  Neck: no JVD, no carotid bruit, no masses Cardiac: RRR; 2/6 murmur, no rubs, or gallops Respiratory:  clear to auscultation bilaterally, normal work of breathing GI: soft, nontender, nondistended, + BS MS: no deformity or atrophy; trace LE edema; distal pulses are 2+ in all 4 extremities   Skin: warm and dry, no rash Neuro:  Strength and sensation are intact Psych: euthymic mood, full affect   EKG:  EKG is not ordered today.  ECHO: 03/31/2017  Recent Labs: 07/15/2016: Hemoglobin 12.9; Platelets 210.0 01/19/2017: ALT 12; TSH 0.81 03/31/2017: BUN 22; Creatinine, Ser 1.30; Potassium 4.5; Pro B Natriuretic peptide (BNP) 102.0; Sodium 142    Lipid Panel    Component Value Date/Time   CHOL 146 01/19/2017 1013   TRIG 67.0 01/19/2017 1013   HDL 62.10 01/19/2017 1013   CHOLHDL 2 01/19/2017 1013   VLDL 13.4 01/19/2017 1013   LDLCALC 71 01/19/2017 1013     Wt Readings from Last 3 Encounters:  06/08/17 (!) 346 lb (156.9 kg)  04/20/17 (!) 345 lb (156.5 kg)  03/31/17 (!) 341 lb (154.7 kg)     Other studies Reviewed: Additional studies/ records that were reviewed today include: office notes, hospital records and testing.  ASSESSMENT AND PLAN:  1.  Chronic diastolic CHF: Her weight is up a pound, but her volume status is good by exam.  She is making positive dietary changes.  She is compliant with her medications.  Check a BMET today to make sure her renal function and electrolytes are stable.  Encourage continue compliance with a low-sodium diabetic diet.  2.  Hypertension: Her systolic blood pressure is minimally above target today.  Gust this with the patient, she realizes that if she loses some weight, her blood pressure will be at goal.  3.  Morbid obesity: We  discussed the long-term deleterious effects of this.  She is motivated to make changes.   Current medicines are reviewed at length with the patient today.  The patient does not have concerns regarding medicines.  The following changes have been made:  no change  Labs/ tests ordered today include:   Orders Placed This Encounter  Procedures  . Basic Metabolic Panel (BMET)     Disposition:   FU with Dr. Ellyn Hack  Signed, Rosaria Ferries, PA-C  06/08/2017 4:45 PM    [facility] Phone: [phone]; Fax: [phone]  This note was written with the assistance of speech recognition software. Please excuse any transcriptional errors.

## 2017-06-08 NOTE — Patient Instructions (Signed)
Medication Instructions:  Continue current medications  If you need a refill on your cardiac medications before your next appointment, please call your pharmacy.  Labwork: BMP Today HERE IN OUR OFFICE AT [facility]  Take the provided lab slips with you to the lab for your blood draw.   You will NOT need to fast   Testing/Procedures: None Ordered  Follow-Up: Your physician wants you to follow-up in: July with Dr Ellyn Hack.      Thank you for choosing [facility] at [facility]!!

## 2017-06-09 LAB — BASIC METABOLIC PANEL
BUN/Creatinine Ratio: 15 (ref 12–28)
BUN: 20 mg/dL (ref 8–27)
CO2: 26 mmol/L (ref 20–29)
Calcium: 9.4 mg/dL (ref 8.7–10.3)
Chloride: 104 mmol/L (ref 96–106)
Creatinine, Ser: 1.37 mg/dL — ABNORMAL HIGH (ref 0.57–1.00)
GFR, EST AFRICAN AMERICAN: 47 mL/min/{1.73_m2} — AB (ref >59)
GFR, EST NON AFRICAN AMERICAN: 41 mL/min/{1.73_m2} — AB (ref >59)
Glucose: 91 mg/dL (ref 65–99)
Potassium: 4.5 mmol/L (ref 3.5–5.2)
SODIUM: 144 mmol/L (ref 134–144)

## 2017-06-21 ENCOUNTER — Ambulatory Visit: Payer: Self-pay | Admitting: Behavioral Health

## 2017-06-21 NOTE — Progress Notes (Signed)
Subjective:   Stacey Page is a 67 y.o. female who presents for Medicare Annual (Initial) preventive examination.  Review of Systems: No ROS.  Medicare Wellness Visit. Additional risk factors are reflected in the social history.    Sleep patterns: wears cpap. Sleeps well per pt.  Home Safety/Smoke Alarms: Feels safe in home. Smoke alarms in place.  Living environment; residence and Firearm Safety: Both daughters live with her. One daughter brought husband and 2 kids. Pt reports dtrs argue a lot. Pt reports this is very stressful for her. No stairs.   Female:   Pap- No longer doing routine screening due to age.      Mammo-utd       Dexa scan-utd        CCS- cologuard ordered     Objective:     Vitals: BP 138/74 (BP Location: Left Arm, Patient Position: Sitting, Cuff Size: Normal)   Pulse 60   Ht 5\' 8"  (1.727 m)   Wt (!) 350 lb 6.4 oz (158.9 kg)   SpO2 95%   BMI 53.28 kg/m   Body mass index is 53.28 kg/m.  Advanced Directives 06/28/2017 03/21/2017 04/04/2016 03/16/2016 03/07/2016 10/07/2015 08/12/2015  Does Patient Have a Medical Advance Directive? No No No No No No No  Would patient like information on creating a medical advance directive? - No - Patient declined Yes (MAU/Ambulatory/Procedural Areas - Information given) No - Patient declined No - Patient declined No - patient declined information No - patient declined information    Tobacco Social History   Tobacco Use  Smoking Status Former Smoker  . Packs/day: 0.25  . Years: 15.00  . Pack years: 3.75  . Types: Cigarettes  . Last attempt to quit: 02/08/1980  . Years since quitting: 37.4  Smokeless Tobacco Former Systems developer  Tobacco Comment   2 packs per week     Counseling given: Not Answered Comment: 2 packs per week   Clinical Intake: Pain : No/denies pain     Past Medical History:  Diagnosis Date  . Anxiety    doesn't take any meds  . Arthritis of right knee 03/14/2016  . Asthma   . Chronic diastolic CHF  (congestive heart failure) (HCC)    HF with Preserved EF (60-65%) - Grade II Diastolic Dysfunction (Hypertensive Heart Disease). takes Furosemide daily  . COPD (chronic obstructive pulmonary disease) (HCC)    Albuterol as needed  . Depression    doesn't take meds  . Diabetes ([place])    takes Januvia daily  . Eczema    uses cream as needed  . GERD (gastroesophageal reflux disease)   . History of bronchitis as a child   . HTN (hypertension)   . Insomnia   . Mild aortic stenosis by prior echocardiogram 04/2017   Mild stenosis: Mean gradient 15 mmHg, peak gradient 28 mmHg  . OA (osteoarthritis)   . OSA (obstructive sleep apnea) 02/25/2014   wears CPAP at night  . Peripheral neuropathy    takes Gabapentin as needed  . Pneumonia    hx of-2010  . Seasonal allergies    uses Flonase daily   Past Surgical History:  Procedure Laterality Date  . CESAREAN SECTION  x2  . COLONOSCOPY    . JOINT REPLACEMENT    . KNEE ARTHROSCOPY Right   . LUNG BIOPSY Right 06/10/2014   Procedure: LUNG BIOPSY;  Surgeon: Ivin Poot, MD;  Location: [place];  Service: Thoracic;  Laterality: Right;  . POLYPECTOMY  throat  . TOTAL KNEE ARTHROPLASTY Right 03/14/2016   Procedure: RIGHT TOTAL KNEE ARTHROPLASTY;  Surgeon: Marybelle Killings, MD;  Location: [place];  Service: Orthopedics;  Laterality: Right;  . TRANSTHORACIC ECHOCARDIOGRAM  04/11/2017    EF 60-65% moderate concentric LVH GRII DD.  Mild aortic stenosis (mean gradient 15 mmHg, peak gradient 28 mmHg).  . VIDEO ASSISTED THORACOSCOPY Right 06/10/2014   Procedure: VIDEO ASSISTED THORACOSCOPY;  Surgeon: Ivin Poot, MD;  Location: [facility] OR;  Service: Thoracic;  Laterality: Right;   Family History  Problem Relation Age of Onset  . COPD Father   . Hypothyroidism Father   . Anemia Father        iron deficiency  . Cancer Mother 53       pancreatic   Social History   Socioeconomic History  . Marital status: Widowed    Spouse name: Not on file  . Number of  children: Not on file  . Years of education: Not on file  . Highest education level: Not on file  Occupational History  . Occupation: disabled  Social Needs  . Financial resource strain: Not on file  . Food insecurity:    Worry: Not on file    Inability: Not on file  . Transportation needs:    Medical: Not on file    Non-medical: Not on file  Tobacco Use  . Smoking status: Former Smoker    Packs/day: 0.25    Years: 15.00    Pack years: 3.75    Types: Cigarettes    Last attempt to quit: 02/08/1980    Years since quitting: 37.4  . Smokeless tobacco: Former Systems developer  . Tobacco comment: 2 packs per week  Substance and Sexual Activity  . Alcohol use: Yes    Alcohol/week: 0.0 oz    Comment: occassional/social/rare  . Drug use: No  . Sexual activity: Not Currently  Lifestyle  . Physical activity:    Days per week: Not on file    Minutes per session: Not on file  . Stress: Not on file  Relationships  . Social connections:    Talks on phone: Not on file    Gets together: Not on file    Attends religious service: Not on file    Active member of club or organization: Not on file    Attends meetings of clubs or organizations: Not on file    Relationship status: Not on file  Other Topics Concern  . Not on file  Social History Narrative  . Not on file    Outpatient Encounter Medications as of 06/28/2017  Medication Sig  . albuterol (PROAIR HFA) 108 (90 Base) MCG/ACT inhaler Inhale 2 puffs into the lungs every 6 (six) hours as needed for wheezing or shortness of breath.  Marland Kitchen aspirin 81 MG tablet Take 1 tablet (81 mg total) by mouth daily with breakfast.  . atorvastatin (LIPITOR) 20 MG tablet Take 1 tablet (20 mg total) by mouth daily at 6 PM.  . carvedilol (COREG) 12.5 MG tablet Take 1 tablet (12.5 mg total) by mouth 2 (two) times daily.  . cholecalciferol (VITAMIN D) 1000 units tablet Take 1,000 Units by mouth daily.  . Clobetasol Prop Emollient Base (CLOBETASOL PROPIONATE E) 0.05 %  emollient cream Apply 1 application topically daily as needed (skin irritations).  . ferrous sulfate (FEROSUL) 325 (65 FE) MG tablet Take 325 mg by mouth daily with breakfast.  . furosemide (LASIX) 20 MG tablet Take 1 tablet (20 mg total) by  mouth daily. Take 1 extra tablet on Monday and Wednesday and Fridays  . lisinopril (PRINIVIL,ZESTRIL) 20 MG tablet Take 1 tablet (20 mg total) by mouth daily.  . mometasone (NASONEX) 50 MCG/ACT nasal spray Place 2 sprays into the nose daily.  . montelukast (SINGULAIR) 10 MG tablet TAKE 1 TABLET (10 MG TOTAL) BY MOUTH AT BEDTIME.  . sitaGLIPtin (JANUVIA) 100 MG tablet Take 1 tablet (100 mg total) by mouth daily.  . Calcium Carbonate-Vitamin D (CALCIUM 600+D) 600-400 MG-UNIT tablet Take 1 tablet by mouth daily.  . Misc. Devices MISC 1 Units by Does not apply route daily. Dispensed 1pair of Diabetic shoe. Dx. Code: E11.41 and R20.2 (Patient not taking: Reported on 06/28/2017)   No facility-administered encounter medications on file as of 06/28/2017.     Activities of Daily Living In your present state of health, do you have any difficulty performing the following activities: 06/28/2017  Hearing? N  Vision? N  Comment wearing glasses.  Difficulty concentrating or making decisions? N  Walking or climbing stairs? N  Dressing or bathing? N  Doing errands, shopping? N  Preparing Food and eating ? N  Using the Toilet? N  In the past six months, have you accidently leaked urine? N  Do you have problems with loss of bowel control? N  Managing your Medications? N  Managing your Finances? N  Housekeeping or managing your Housekeeping? N  Some recent data might be hidden    Patient Care Team: [name], [name], NP as PCP - General (Internal Medicine) Leonie Man, MD as PCP - Cardiology (Cardiology)    Assessment:   This is a routine wellness examination for Stacey Page. Physical assessment deferred to PCP.  Exercise Activities and Dietary  recommendations Current Exercise Habits: The patient does not participate in regular exercise at present Diet (meal preparation, eat out, water intake, caffeinated beverages, dairy products, fruits and vegetables): in general, an "unhealthy" diet, high fat/ cholesterol, high salt Breakfast: skipped Lunch: fried ckn sandwich, fries, orange soda Dinner: chkn breast, mashed potatoes, veg Proper nutrition discussed and encouraged   Goals    . Increase physical activity     Begin silver sneakers at least 2x/ week. Walk to mailbox everyday.       Fall Risk Fall Risk  06/28/2017 03/21/2017 11/18/2015 10/07/2015 08/18/2015  Falls in the past year? No No No No No  Risk for fall due to : - - - - -  Risk for fall due to: Comment - - - - -   Depression Screen PHQ 2/9 Scores 06/28/2017 03/21/2017 10/07/2015 08/18/2015  PHQ - 2 Score 1 1 1 1   PHQ- 9 Score - - - -     Cognitive Function Ad8 score reviewed for issues:  Issues making decisions:no  Less interest in hobbies / activities:no  Repeats questions, stories (family complaining):no  Trouble using ordinary gadgets (microwave, computer, phone):no  Forgets the month or year: no  Mismanaging finances: no  Remembering appts:no  Daily problems with thinking and/or memory:no Ad8 score is=0        Immunization History  Administered Date(s) Administered  . Influenza Split 11/21/2016  . Influenza,inj,Quad PF,6+ Mos 01/12/2015, 11/19/2015  . Pneumococcal Conjugate-13 01/19/2017  . Pneumococcal Polysaccharide-23 01/12/2015    Screening Tests Health Maintenance  Topic Date Due  . TETANUS/TDAP  02/07/2017  . OPHTHALMOLOGY EXAM  05/18/2017  . Fecal DNA (Cologuard)  06/01/2017  . INFLUENZA VACCINE  09/21/2017 (Originally 09/07/2017)  . PAP SMEAR  12/08/2017 (Originally 11/22/1972)  .  HEMOGLOBIN A1C  07/20/2017  . FOOT EXAM  01/19/2018  . MAMMOGRAM  11/17/2018  . PNA vac Low Risk Adult (2 of 2 - PPSV23) 01/12/2020  . DEXA SCAN   Completed  . Hepatitis C Screening  Completed  . HIV Screening  Completed      Plan:   Follow up with PCP as directed  Please schedule your next medicare wellness visit with me in 1 yr.  Eat heart healthy diet (full of fruits, vegetables, whole grains, lean protein, water--limit salt, fat, and sugar intake) and increase physical activity as tolerated.  Continue doing brain stimulating activities (puzzles, reading, adult coloring books, staying active) to keep memory sharp.   I have ordered your Cologuard. You should receive in the mail soon.   I have personally reviewed and noted the following in the patient's chart:   . Medical and social history . Use of alcohol, tobacco or illicit drugs  . Current medications and supplements . Functional ability and status . Nutritional status . Physical activity . Advanced directives . List of other physicians . Hospitalizations, surgeries, and ER visits in previous 12 months . Vitals . Screenings to include cognitive, depression, and falls . Referrals and appointments  In addition, I have reviewed and discussed with patient certain preventive protocols, quality metrics, and best practice recommendations. A written personalized care plan for preventive services as well as general preventive health recommendations were provided to patient.     Shela Nevin, [place]  06/28/2017

## 2017-06-28 ENCOUNTER — Encounter: Payer: Self-pay | Admitting: Behavioral Health

## 2017-06-28 ENCOUNTER — Ambulatory Visit (INDEPENDENT_AMBULATORY_CARE_PROVIDER_SITE_OTHER): Payer: PPO | Admitting: Behavioral Health

## 2017-06-28 VITALS — BP 138/74 | HR 60 | Ht 68.0 in | Wt 350.4 lb

## 2017-06-28 DIAGNOSIS — Z1211 Encounter for screening for malignant neoplasm of colon: Secondary | ICD-10-CM

## 2017-06-28 DIAGNOSIS — Z0000 Encounter for general adult medical examination without abnormal findings: Secondary | ICD-10-CM

## 2017-06-28 NOTE — Patient Instructions (Signed)
Follow up with PCP as directed  Please schedule your next medicare wellness visit with me in 1 yr.  Eat heart healthy diet (full of fruits, vegetables, whole grains, lean protein, water--limit salt, fat, and sugar intake) and increase physical activity as tolerated.  Continue doing brain stimulating activities (puzzles, reading, adult coloring books, staying active) to keep memory sharp.   I have ordered your Cologuard. You should receive in the mail soon.   Stacey Page , Thank you for taking time to come for your Medicare Wellness Visit. I appreciate your ongoing commitment to your health goals. Please review the following plan we discussed and let me know if I can assist you in the future.   These are the goals we discussed: Goals    . Increase physical activity     Begin silver sneakers at least 2x/ week. Walk to mailbox everyday.       This is a list of the screening recommended for you and due dates:  Health Maintenance  Topic Date Due  . Tetanus Vaccine  02/07/2017  . Eye exam for diabetics  05/18/2017  . Cologuard (Stool DNA test)  06/01/2017  . Flu Shot  09/21/2017*  . Pap Smear  12/08/2017*  . Hemoglobin A1C  07/20/2017  . Complete foot exam   01/19/2018  . Mammogram  11/17/2018  . Pneumonia vaccines (2 of 2 - PPSV23) 01/12/2020  . DEXA scan (bone density measurement)  Completed  .  Hepatitis C: One time screening is recommended by Center for Disease Control  (CDC) for  adults born from [birth year] through 1964.   Completed  . HIV Screening  Completed  *Topic was postponed. The date shown is not the original due date.    Health Maintenance for Postmenopausal Women Menopause is a normal process in which your reproductive ability comes to an end. This process happens gradually over a span of months to years, usually between the ages of 102 and 48. Menopause is complete when you have missed 12 consecutive menstrual periods. It is important to talk with your health care provider  about some of the most common conditions that affect postmenopausal women, such as heart disease, cancer, and bone loss (osteoporosis). Adopting a healthy lifestyle and getting preventive care can help to promote your health and wellness. Those actions can also lower your chances of developing some of these common conditions. What should I know about menopause? During menopause, you may experience a number of symptoms, such as:  Moderate-to-severe hot flashes.  Night sweats.  Decrease in sex drive.  Mood swings.  Headaches.  Tiredness.  Irritability.  Memory problems.  Insomnia.  Choosing to treat or not to treat menopausal changes is an individual decision that you make with your health care provider. What should I know about hormone replacement therapy and supplements? Hormone therapy products are effective for treating symptoms that are associated with menopause, such as hot flashes and night sweats. Hormone replacement carries certain risks, especially as you become older. If you are thinking about using estrogen or estrogen with progestin treatments, discuss the benefits and risks with your health care provider. What should I know about heart disease and stroke? Heart disease, heart attack, and stroke become more likely as you age. This may be due, in part, to the hormonal changes that your body experiences during menopause. These can affect how your body processes dietary fats, triglycerides, and cholesterol. Heart attack and stroke are both medical emergencies. There are many things that you can  do to help prevent heart disease and stroke:  Have your blood pressure checked at least every 1-2 years. High blood pressure causes heart disease and increases the risk of stroke.  If you are 26-71 years old, ask your health care provider if you should take aspirin to prevent a heart attack or a stroke.  Do not use any tobacco products, including cigarettes, chewing tobacco, or  electronic cigarettes. If you need help quitting, ask your health care provider.  It is important to eat a healthy diet and maintain a healthy weight. ? Be sure to include plenty of vegetables, fruits, low-fat dairy products, and lean protein. ? Avoid eating foods that are high in solid fats, added sugars, or salt (sodium).  Get regular exercise. This is one of the most important things that you can do for your health. ? Try to exercise for at least 150 minutes each week. The type of exercise that you do should increase your heart rate and make you sweat. This is known as moderate-intensity exercise. ? Try to do strengthening exercises at least twice each week. Do these in addition to the moderate-intensity exercise.  Know your numbers.Ask your health care provider to check your cholesterol and your blood glucose. Continue to have your blood tested as directed by your health care provider.  What should I know about cancer screening? There are several types of cancer. Take the following steps to reduce your risk and to catch any cancer development as early as possible. Breast Cancer  Practice breast self-awareness. ? This means understanding how your breasts normally appear and feel. ? It also means doing regular breast self-exams. Let your health care provider know about any changes, no matter how small.  If you are 63 or older, have a clinician do a breast exam (clinical breast exam or CBE) every year. Depending on your age, family history, and medical history, it may be recommended that you also have a yearly breast X-ray (mammogram).  If you have a family history of breast cancer, talk with your health care provider about genetic screening.  If you are at high risk for breast cancer, talk with your health care provider about having an MRI and a mammogram every year.  Breast cancer (BRCA) gene test is recommended for women who have family members with BRCA-related cancers. Results of the  assessment will determine the need for genetic counseling and BRCA1 and for BRCA2 testing. BRCA-related cancers include these types: ? Breast. This occurs in males or females. ? Ovarian. ? Tubal. This may also be called fallopian tube cancer. ? Cancer of the abdominal or pelvic lining (peritoneal cancer). ? Prostate. ? Pancreatic.  Cervical, Uterine, and Ovarian Cancer Your health care provider may recommend that you be screened regularly for cancer of the pelvic organs. These include your ovaries, uterus, and vagina. This screening involves a pelvic exam, which includes checking for microscopic changes to the surface of your cervix (Pap test).  For women ages 21-65, health care providers may recommend a pelvic exam and a Pap test every three years. For women ages 58-65, they may recommend the Pap test and pelvic exam, combined with testing for human papilloma virus (HPV), every five years. Some types of HPV increase your risk of cervical cancer. Testing for HPV may also be done on women of any age who have unclear Pap test results.  Other health care providers may not recommend any screening for nonpregnant women who are considered low risk for pelvic cancer and  have no symptoms. Ask your health care provider if a screening pelvic exam is right for you.  If you have had past treatment for cervical cancer or a condition that could lead to cancer, you need Pap tests and screening for cancer for at least 20 years after your treatment. If Pap tests have been discontinued for you, your risk factors (such as having a new sexual partner) need to be reassessed to determine if you should start having screenings again. Some women have medical problems that increase the chance of getting cervical cancer. In these cases, your health care provider may recommend that you have screening and Pap tests more often.  If you have a family history of uterine cancer or ovarian cancer, talk with your health care provider  about genetic screening.  If you have vaginal bleeding after reaching menopause, tell your health care provider.  There are currently no reliable tests available to screen for ovarian cancer.  Lung Cancer Lung cancer screening is recommended for adults 68-45 years old who are at high risk for lung cancer because of a history of smoking. A yearly low-dose CT scan of the lungs is recommended if you:  Currently smoke.  Have a history of at least 30 pack-years of smoking and you currently smoke or have quit within the past 15 years. A pack-year is smoking an average of one pack of cigarettes per day for one year.  Yearly screening should:  Continue until it has been 15 years since you quit.  Stop if you develop a health problem that would prevent you from having lung cancer treatment.  Colorectal Cancer  This type of cancer can be detected and can often be prevented.  Routine colorectal cancer screening usually begins at age 97 and continues through age 5.  If you have risk factors for colon cancer, your health care provider may recommend that you be screened at an earlier age.  If you have a family history of colorectal cancer, talk with your health care provider about genetic screening.  Your health care provider may also recommend using home test kits to check for hidden blood in your stool.  A small camera at the end of a tube can be used to examine your colon directly (sigmoidoscopy or colonoscopy). This is done to check for the earliest forms of colorectal cancer.  Direct examination of the colon should be repeated every 5-10 years until age 23. However, if early forms of precancerous polyps or small growths are found or if you have a family history or genetic risk for colorectal cancer, you may need to be screened more often.  Skin Cancer  Check your skin from head to toe regularly.  Monitor any moles. Be sure to tell your health care provider: ? About any new moles or  changes in moles, especially if there is a change in a mole's shape or color. ? If you have a mole that is larger than the size of a pencil eraser.  If any of your family members has a history of skin cancer, especially at a young age, talk with your health care provider about genetic screening.  Always use sunscreen. Apply sunscreen liberally and repeatedly throughout the day.  Whenever you are outside, protect yourself by wearing long sleeves, pants, a wide-brimmed hat, and sunglasses.  What should I know about osteoporosis? Osteoporosis is a condition in which bone destruction happens more quickly than new bone creation. After menopause, you may be at an increased risk for  osteoporosis. To help prevent osteoporosis or the bone fractures that can happen because of osteoporosis, the following is recommended:  If you are 59-52 years old, get at least 1,000 mg of calcium and at least 600 mg of vitamin D per day.  If you are older than age 14 but younger than age 57, get at least 1,200 mg of calcium and at least 600 mg of vitamin D per day.  If you are older than age 26, get at least 1,200 mg of calcium and at least 800 mg of vitamin D per day.  Smoking and excessive alcohol intake increase the risk of osteoporosis. Eat foods that are rich in calcium and vitamin D, and do weight-bearing exercises several times each week as directed by your health care provider. What should I know about how menopause affects my mental health? Depression may occur at any age, but it is more common as you become older. Common symptoms of depression include:  Low or sad mood.  Changes in sleep patterns.  Changes in appetite or eating patterns.  Feeling an overall lack of motivation or enjoyment of activities that you previously enjoyed.  Frequent crying spells.  Talk with your health care provider if you think that you are experiencing depression. What should I know about immunizations? It is important  that you get and maintain your immunizations. These include:  Tetanus, diphtheria, and pertussis (Tdap) booster vaccine.  Influenza every year before the flu season begins.  Pneumonia vaccine.  Shingles vaccine.  Your health care provider may also recommend other immunizations. This information is not intended to replace advice given to you by your health care provider. Make sure you discuss any questions you have with your health care provider. Document Released: 03/18/2005 Document Revised: 08/14/2015 Document Reviewed: 10/28/2014 Elsevier Interactive Patient Education  2018 [employer].

## 2017-06-28 NOTE — Progress Notes (Signed)
Medical screening examination/treatment/procedure(s) were performed by the Wellness Coach, RN. As primary care provider I was immediately available for consulation/collaboration. I agree with above documentation. Charlotte [name], AGNP-C 

## 2017-07-24 ENCOUNTER — Other Ambulatory Visit: Payer: Self-pay | Admitting: Nurse Practitioner

## 2017-07-24 DIAGNOSIS — E785 Hyperlipidemia, unspecified: Secondary | ICD-10-CM

## 2017-07-24 MED ORDER — ATORVASTATIN CALCIUM 20 MG PO TABS: 20.0000 mg | ORAL_TABLET | Freq: Every day | ORAL | 0 refills | Status: DC

## 2017-07-24 NOTE — Telephone Encounter (Signed)
Copied from [place] [id number]. Topic: Quick Communication - Rx Refill/Question >> Jul 24, 2017 11:22 AM Cecelia Byars, NT wrote: Medication:  atorvastatin (LIPITOR) 20 MG tablet   Has the patient contacted their pharmacy? yes  (Agent: If no, request that the patient contact the pharmacy for the refill. (Agent: If yes, when and what did the pharmacy advise? Preferred Pharmacy (with phone number or street name): [id number] [address], [place]. [phone] (Phone) [phone] (Fax)  Pharmacy has told patient they have had no response for request for refill   Agent: Please be advised that RX refills may take up to 3 business days. We ask that you follow-up with your pharmacy.

## 2017-08-15 ENCOUNTER — Other Ambulatory Visit: Payer: Self-pay | Admitting: Nurse Practitioner

## 2017-08-15 DIAGNOSIS — E785 Hyperlipidemia, unspecified: Secondary | ICD-10-CM

## 2017-08-15 NOTE — Telephone Encounter (Signed)
90 day sent. Pt must see PCP for more refills.

## 2017-08-18 ENCOUNTER — Ambulatory Visit: Payer: PPO | Admitting: Cardiology

## 2017-08-18 ENCOUNTER — Ambulatory Visit: Payer: Self-pay | Admitting: Cardiology

## 2017-08-18 ENCOUNTER — Encounter: Payer: Self-pay | Admitting: Cardiology

## 2017-08-18 VITALS — BP 151/65 | HR 66 | Ht 68.0 in | Wt 353.6 lb

## 2017-08-18 DIAGNOSIS — I5032 Chronic diastolic (congestive) heart failure: Secondary | ICD-10-CM

## 2017-08-18 DIAGNOSIS — E785 Hyperlipidemia, unspecified: Secondary | ICD-10-CM | POA: Diagnosis not present

## 2017-08-18 DIAGNOSIS — R0600 Dyspnea, unspecified: Secondary | ICD-10-CM

## 2017-08-18 DIAGNOSIS — R0609 Other forms of dyspnea: Secondary | ICD-10-CM

## 2017-08-18 DIAGNOSIS — I110 Hypertensive heart disease with heart failure: Secondary | ICD-10-CM | POA: Diagnosis not present

## 2017-08-18 DIAGNOSIS — G4733 Obstructive sleep apnea (adult) (pediatric): Secondary | ICD-10-CM

## 2017-08-18 DIAGNOSIS — Z01818 Encounter for other preprocedural examination: Secondary | ICD-10-CM | POA: Diagnosis not present

## 2017-08-18 DIAGNOSIS — I10 Essential (primary) hypertension: Secondary | ICD-10-CM | POA: Diagnosis not present

## 2017-08-18 MED ORDER — CARVEDILOL 12.5 MG PO TABS: 18.7500 mg | ORAL_TABLET | Freq: Two times a day (BID) | ORAL | 3 refills | Status: DC

## 2017-08-18 MED ORDER — METOPROLOL TARTRATE 50 MG PO TABS: 50.0000 mg | ORAL_TABLET | Freq: Once | ORAL | 0 refills | Status: DC

## 2017-08-18 NOTE — Patient Instructions (Addendum)
MEDICATION INSTRUCTIONS  INCREASE LASIX 40 MG ONE TABLET DAILY , MAY TAKE AN ADDITIONAL 40 MG  AT 2 PM IF NEEDED FOR SWELLING OR SHORTNESS OF BREATHE.     INCREASE CARVEDILOL 18.75 MG ( 1 AND 1/2 TABLETS) TWICE  A DAY.   SCHEDULE AT [facility]  Your physician has requested that you have cardiac CT ANGIOGRAM. Cardiac computed tomography (CT) is a painless test that uses an x-ray machine to take clear, detailed pictures of your heart. For further information please visit [email or web address]. Please follow instruction sheet as given.   Your physician recommends that you schedule a follow-up appointment in 2 - Roy.     Please arrive at the [employer] main entrance of [facility] at  (30-45 minutes prior to test start time)  [facility] [place], [place] [address] [phone]  Proceed to the [facility] Radiology Department (First Floor).  Please follow these instructions carefully (unless otherwise directed):  PLEASE DO LAB WORK - BMP ONE WEEK PRIOR TO THE TEST.  On the Night Before the Test: . Drink plenty of water. . Do not consume any caffeinated/decaffeinated beverages or chocolate 12 hours prior to your test. . Do not take any antihistamines 12 hours prior to your test.   On the Day of the Test: . Drink plenty of water. Do not drink any water within one hour of the test. . Do not eat any food 4 hours prior to the test. . You may take your regular medications prior to the test. . I - Take CARVEDILOL THE MORNING OF the test. . HOLD Furosemide  And Januvia morning of the test.  After the Test: . Drink plenty of water. . After receiving IV contrast, you may experience a mild flushed feeling. This is normal. . On occasion, you may experience a mild rash up to 24 hours after the test. This is not dangerous. If this occurs, you can take Benadryl 25 mg and increase your fluid intake. . If you experience trouble  breathing, this can be serious. If it is severe call 911 IMMEDIATELY. If it is mild, please call our office. . If you take any of these medications: Glipizide/Metformin, Avandament, Glucavance, please do not take 48 hours after completing test.

## 2017-08-18 NOTE — Progress Notes (Signed)
PCP: Flossie Buffy, NP  Clinic Note: Chief Complaint  Patient presents with  . Follow-up    still SOB; can't lose weight    HPI: Stacey Page is a 67 y.o. female with a PMH below who presents today for one-month follow-up having seen Rosaria Ferries, PA-C. Morbidly obese woman with hypertension, hyperlipidemia and diabetes along with OSA on CPAP as well as asthma and reported diastolic heart failure.  Stacey Page was initially seen on March 23, 2017 by Rosaria Ferries, PA CHF evaluation of CHF with complaints of weight gain and shortness of breath.  Had gained 40 pounds since February 2018 time knee surgery.  Was having difficulty changing eating habits.  Noted exertional dyspnea with walking and feels tired all the time.  Indicates pretty good.  that she is compliant with CPAP.  ->  Thought to be mildly overloaded from a volume standpoint.    2D echo ordered  Told to increase Lasix to double dose for 2 days a week.  Discussed dietary discretion -Daily weights.   Converted from labetalol to metoprolol (presumably   .because of concern about renal function).  Dose reduced to 50 mg twice daily --> because of concerns of bradycardia.  Recent Hospitalizations: None  Studies Personally Reviewed - (if available, images/films reviewed: From Epic Chart or Care Everywhere)  2D Echo Mar 31, 2017: EF 60-65% moderate concentric LVH GRII DD.  Mild aortic stenosis (mean gradient 15 mmHg, peak gradient 28 mmHg).  Interval History: Stacey Page presents for follow-up noting that she still gets SOB simply walking back to the clinic room.  No CP with exertion.   Edema seems to be better -- still a pit "puffy", & still sleeps sitting up a bit - uses CPAP.  No rapid / irregular HR, but does note occasionally feeling dizzy-- poor balance. Still not able to loose weight. No syncope or near syncope, TIA or amaurosis fugax symptoms. Does not really walk enough to note claudication.    ROS: A comprehensive was performed. Review of Systems  Constitutional: Negative for weight loss (In fact she gained).  HENT: Negative for congestion and nosebleeds.   Respiratory: Positive for shortness of breath. Negative for cough and wheezing.   Gastrointestinal: Negative for abdominal pain, blood in stool and melena.  Genitourinary: Negative for hematuria.  Musculoskeletal: Positive for joint pain (Postop knee surgery pain).  Neurological: Positive for dizziness.  Psychiatric/Behavioral: Negative for depression and memory loss. The patient is not nervous/anxious and does not have insomnia.   All other systems reviewed and are negative.  I have reviewed and (if needed) personally updated the patient's problem list, medications, allergies, past medical and surgical history, social and family history.   Past Medical History:  Diagnosis Date  . Anxiety    doesn't take any meds  . Arthritis of right knee 03/14/2016  . Asthma   . Chronic diastolic CHF (congestive heart failure) (HCC)    HF with Preserved EF (60-65%) - Grade II Diastolic Dysfunction (Hypertensive Heart Disease). takes Furosemide daily  . COPD (chronic obstructive pulmonary disease) (HCC)    Albuterol as needed  . Depression    doesn't take meds  . Diabetes ([place])    takes Januvia daily  . Eczema    uses cream as needed  . GERD (gastroesophageal reflux disease)   . History of bronchitis as a child   . HTN (hypertension)   . Insomnia   . Mild aortic stenosis by prior echocardiogram 04/2017   Mild stenosis:  Mean gradient 15 mmHg, peak gradient 28 mmHg  . OA (osteoarthritis)   . OSA (obstructive sleep apnea) 02/25/2014   wears CPAP at night  . Peripheral neuropathy    takes Gabapentin as needed  . Pneumonia    hx of-2010  . Seasonal allergies    uses Flonase daily    Past Surgical History:  Procedure Laterality Date  . CESAREAN SECTION  x2  . COLONOSCOPY    . JOINT REPLACEMENT    . KNEE ARTHROSCOPY Right    . LUNG BIOPSY Right 06/10/2014   Procedure: LUNG BIOPSY;  Surgeon: Ivin Poot, MD;  Location: [facility];  Service: Thoracic;  Laterality: Right;  . POLYPECTOMY     throat  . TOTAL KNEE ARTHROPLASTY Right 03/14/2016   Procedure: RIGHT TOTAL KNEE ARTHROPLASTY;  Surgeon: Marybelle Killings, MD;  Location: [facility];  Service: Orthopedics;  Laterality: Right;  . TRANSTHORACIC ECHOCARDIOGRAM  03/2017    EF 60-65% moderate concentric LVH GRII DD.  Mild aortic stenosis (mean gradient 15 mmHg, peak gradient 28 mmHg).  . VIDEO ASSISTED THORACOSCOPY Right 06/10/2014   Procedure: VIDEO ASSISTED THORACOSCOPY;  Surgeon: Ivin Poot, MD;  Location: [facility] OR;  Service: Thoracic;  Laterality: Right;    Current Meds  Medication Sig  . albuterol (PROAIR HFA) 108 (90 Base) MCG/ACT inhaler Inhale 2 puffs into the lungs every 6 (six) hours as needed for wheezing or shortness of breath.  Marland Kitchen aspirin 81 MG tablet Take 1 tablet (81 mg total) by mouth daily with breakfast.  . atorvastatin (LIPITOR) 20 MG tablet TAKE 1 TABLET (20 MG TOTAL) BY MOUTH DAILY AT 6 PM.  . Calcium Carbonate-Vitamin D (CALCIUM 600+D) 600-400 MG-UNIT tablet Take 1 tablet by mouth daily.  . cholecalciferol (VITAMIN D) 1000 units tablet Take 1,000 Units by mouth daily.  . Clobetasol Prop Emollient Base (CLOBETASOL PROPIONATE E) 0.05 % emollient cream Apply 1 application topically daily as needed (skin irritations).  . ferrous sulfate (FEROSUL) 325 (65 FE) MG tablet Take 325 mg by mouth daily with breakfast.  . lisinopril (PRINIVIL,ZESTRIL) 20 MG tablet Take 1 tablet (20 mg total) by mouth daily.  . mometasone (NASONEX) 50 MCG/ACT nasal spray Place 2 sprays into the nose daily.  . montelukast (SINGULAIR) 10 MG tablet TAKE 1 TABLET (10 MG TOTAL) BY MOUTH AT BEDTIME.  . sitaGLIPtin (JANUVIA) 100 MG tablet Take 1 tablet (100 mg total) by mouth daily.  . [DISCONTINUED] furosemide (LASIX) 20 MG tablet Take 1 tablet (20 mg total) by mouth daily. Take 1 extra tablet  on Monday and Wednesday and Fridays    Allergies  Allergen Reactions  . No Known Allergies     Social History   Tobacco Use  . Smoking status: Former Smoker    Packs/day: 0.25    Years: 15.00    Pack years: 3.75    Types: Cigarettes    Last attempt to quit: 02/08/1980    Years since quitting: 37.5  . Smokeless tobacco: Former Systems developer  . Tobacco comment: 2 packs per week  Substance Use Topics  . Alcohol use: Yes    Alcohol/week: 0.0 oz    Comment: occassional/social/rare  . Drug use: No   Social History   Social History Narrative  . Not on file    family history includes Anemia in her father; COPD in her father; Cancer (age of onset: 17) in her mother; Hypothyroidism in her father.  Wt Readings from Last 3 Encounters:  08/18/17 (!) 353 lb  9.6 oz (160.4 kg)  06/28/17 (!) 350 lb 6.4 oz (158.9 kg)  06/08/17 (!) 346 lb (156.9 kg)    PHYSICAL EXAM BP (!) 151/65   Pulse 66   Ht 5\' 8"  (1.727 m)   Wt (!) 353 lb 9.6 oz (160.4 kg)   BMI 53.76 kg/m   Follow-up BP after 5 min rest  119/61 mHg Physical Exam  Constitutional: She is oriented to person, place, and time. No distress.  Morbidly obese woman.  Well-groomed.  HENT:  Head: Normocephalic and atraumatic.  Neck: Normal range of motion. Neck supple. No hepatojugular reflux (Unable to assess) and no JVD (unable to assess) present. Carotid bruit is not present (Radiated aortic murmur).  Unable to assess due to body habitus  Cardiovascular: Normal rate, regular rhythm, S1 normal and S2 normal.  No extrasystoles are present. PMI is not displaced (Unable to assess). Exam reveals distant heart sounds and decreased pulses (More because of edema.  Difficult to palpate.). Exam reveals no gallop and no friction rub.  Murmur heard.  Harsh crescendo-decrescendo midsystolic murmur is present with a grade of 2/6 at the upper right sternal border radiating to the neck. Pulmonary/Chest: Effort normal and breath sounds normal. She has no  wheezes. She has no rales.  Increased work of breathing, but nondistressed.  Abdominal: Soft. Bowel sounds are normal. She exhibits no distension. There is no tenderness. There is no rebound.  Morbidly obese.  Unable to assess HSM  Musculoskeletal: Normal range of motion. She exhibits edema (1+ bilateral -more puffy and not pitting).  Neurological: She is alert and oriented to person, place, and time.  Psychiatric: She has a normal mood and affect. Her behavior is normal. Thought content normal.  Nursing note and vitals reviewed.   Adult ECG Report Not checked  Other studies Reviewed: Additional studies/ records that were reviewed today include:  Recent Labs:   Lab Results  Component Value Date   CREATININE 1.37 (H) 06/08/2017   BUN 20 06/08/2017   NA 144 06/08/2017   K 4.5 06/08/2017   CL 104 06/08/2017   CO2 26 06/08/2017   Lab Results  Component Value Date   CHOL 146 01/19/2017   HDL 62.10 01/19/2017   LDLCALC 71 01/19/2017   TRIG 67.0 01/19/2017   CHOLHDL 2 01/19/2017    ASSESSMENT / PLAN: Problem List Items Addressed This Visit    OSA (obstructive sleep apnea) (Chronic)    Needs to continue using CPAP.  We talked about the importance of treating OSA to avoid worsening pulmonary hypertension.      Morbid obesity ([place]) (Chronic)    Needs to lose weight.  This is intermittent for OSA and her dyspnea.  She probably has some obesity hypoventilation syndrome as well.  The probably benefit by doing water aerobics.  Also need to adjust diet      Hypertensive heart disease with chronic diastolic congestive heart failure ([place]) - Primary (Chronic)    Weight continues to climb go down.  Partly because she just cannot exercise. The exam is very difficult, but I cannot tell if she is volume overloaded. She clearly is hypertensive with activity since her blood pressure went into the 160s just walking back into the exam room.  Needs better control. Plan: Increase carvedilol to  81 7 5 mg twice daily. Increase Lasix to 40 mg daily in the morning with PRN additional dosing the afternoon if necessary for more edema. Continue current dose of ACE inhibitor.  Relevant Medications   carvedilol (COREG) 12.5 MG tablet   furosemide (LASIX) 40 MG tablet   Other Relevant Orders   CT CORONARY MORPH W/CTA COR W/SCORE W/CA W/CM &/OR WO/CM   CT CORONARY FRACTIONAL FLOW RESERVE DATA PREP   CT CORONARY FRACTIONAL FLOW RESERVE FLUID ANALYSIS   Basic metabolic panel   Hyperlipidemia LDL goal <70 (Chronic)    On atorvastatin.  Lipids look pretty good.      Relevant Medications   carvedilol (COREG) 12.5 MG tablet   furosemide (LASIX) 40 MG tablet   Essential hypertension (Chronic)    Changes in blood pressure with any minimal activity was monitor dyspnea. Plan: Increase carvedilol dose to 81 7 5 mg twice daily.      Relevant Medications   carvedilol (COREG) 12.5 MG tablet   furosemide (LASIX) 40 MG tablet   DOE (dyspnea on exertion)   Relevant Orders   CT CORONARY MORPH W/CTA COR W/SCORE W/CA W/CM &/OR WO/CM   CT CORONARY FRACTIONAL FLOW RESERVE DATA PREP   CT CORONARY FRACTIONAL FLOW RESERVE FLUID ANALYSIS   Basic metabolic panel    Other Visit Diagnoses    Pre-op testing       Relevant Orders   Basic metabolic panel      Current medicines are reviewed at length with the patient today. (+/- concerns) n/a The following changes have been made: see below   Patient Instructions  MEDICATION INSTRUCTIONS  INCREASE LASIX 40 MG ONE TABLET DAILY , MAY TAKE AN ADDITIONAL 40 MG  AT 2 PM IF NEEDED FOR SWELLING OR SHORTNESS OF BREATHE.     INCREASE CARVEDILOL 18.75 MG ( 1 AND 1/2 TABLETS) TWICE  A DAY.   SCHEDULE AT [facility]  Your physician has requested that you have cardiac CT ANGIOGRAM. Cardiac computed tomography (CT) is a painless test that uses an x-ray machine to take clear, detailed pictures of your heart. For further information please visit  [email or web address]. Please follow instruction sheet as given.   Your physician recommends that you schedule a follow-up appointment in 2 - [place].     Please arrive at the [facility] main entrance of [facility] at  (30-45 minutes prior to test start time)  [facility] [place], [place] [address] [phone]  Proceed to the [facility] Radiology Department (First Floor).  Please follow these instructions carefully (unless otherwise directed):  PLEASE DO LAB WORK - BMP ONE WEEK PRIOR TO THE TEST.  On the Night Before the Test: . Drink plenty of water. . Do not consume any caffeinated/decaffeinated beverages or chocolate 12 hours prior to your test. . Do not take any antihistamines 12 hours prior to your test.   On the Day of the Test: . Drink plenty of water. Do not drink any water within one hour of the test. . Do not eat any food 4 hours prior to the test. . You may take your regular medications prior to the test. . I - Take CARVEDILOL THE MORNING OF the test. . HOLD Furosemide  And Januvia morning of the test.  After the Test: . Drink plenty of water. . After receiving IV contrast, you may experience a mild flushed feeling. This is normal. . On occasion, you may experience a mild rash up to 24 hours after the test. This is not dangerous. If this occurs, you can take Benadryl 25 mg and increase your fluid intake. . If you experience trouble  breathing, this can be serious. If it is severe call 911 IMMEDIATELY. If it is mild, please call our office. . If you take any of these medications: Glipizide/Metformin, Avandament, Glucavance, please do not take 48 hours after completing test.        Studies Ordered:   Orders Placed This Encounter  Procedures  . CT CORONARY MORPH W/CTA COR W/SCORE W/CA W/CM &/OR WO/CM  . CT CORONARY FRACTIONAL FLOW RESERVE DATA PREP  . CT CORONARY FRACTIONAL FLOW RESERVE FLUID ANALYSIS    . Basic metabolic panel      Glenetta Hew, M.D., M.S. Interventional Cardiologist   Pager # [phone] Phone # [phone] [address] [place], [place] [address]   Thank you for choosing [facility] at [facility]!!

## 2017-08-21 ENCOUNTER — Encounter: Payer: Self-pay | Admitting: Cardiology

## 2017-08-21 MED ORDER — FUROSEMIDE 40 MG PO TABS
ORAL_TABLET | ORAL | 3 refills | Status: DC
Start: 2017-08-21 — End: 2018-08-14

## 2017-08-21 NOTE — Assessment & Plan Note (Signed)
Changes in blood pressure with any minimal activity was monitor dyspnea. Plan: Increase carvedilol dose to 81 7 5 mg twice daily.

## 2017-08-21 NOTE — Assessment & Plan Note (Signed)
On atorvastatin.  Lipids look pretty good.

## 2017-08-21 NOTE — Assessment & Plan Note (Signed)
Needs to lose weight.  This is intermittent for OSA and her dyspnea.  She probably has some obesity hypoventilation syndrome as well.  The probably benefit by doing water aerobics.  Also need to adjust diet

## 2017-08-21 NOTE — Assessment & Plan Note (Signed)
Needs to continue using CPAP.  We talked about the importance of treating OSA to avoid worsening pulmonary hypertension.

## 2017-08-21 NOTE — Assessment & Plan Note (Signed)
Weight continues to climb go down.  Partly because she just cannot exercise. The exam is very difficult, but I cannot tell if she is volume overloaded. She clearly is hypertensive with activity since her blood pressure went into the 160s just walking back into the exam room.  Needs better control. Plan: Increase carvedilol to 81 7 5 mg twice daily. Increase Lasix to 40 mg daily in the morning with PRN additional dosing the afternoon if necessary for more edema. Continue current dose of ACE inhibitor.

## 2017-08-22 DIAGNOSIS — G4700 Insomnia, unspecified: Secondary | ICD-10-CM | POA: Diagnosis not present

## 2017-08-22 DIAGNOSIS — R0683 Snoring: Secondary | ICD-10-CM | POA: Diagnosis not present

## 2017-08-22 DIAGNOSIS — Z1211 Encounter for screening for malignant neoplasm of colon: Secondary | ICD-10-CM | POA: Diagnosis not present

## 2017-08-22 DIAGNOSIS — G4733 Obstructive sleep apnea (adult) (pediatric): Secondary | ICD-10-CM | POA: Diagnosis not present

## 2017-09-01 LAB — COLOGUARD: COLOGUARD: NEGATIVE

## 2017-09-07 HISTORY — PX: OTHER SURGICAL HISTORY: SHX169

## 2017-09-29 DIAGNOSIS — Z01818 Encounter for other preprocedural examination: Secondary | ICD-10-CM | POA: Diagnosis not present

## 2017-09-29 DIAGNOSIS — I5032 Chronic diastolic (congestive) heart failure: Secondary | ICD-10-CM | POA: Diagnosis not present

## 2017-09-29 DIAGNOSIS — R0609 Other forms of dyspnea: Secondary | ICD-10-CM | POA: Diagnosis not present

## 2017-09-29 DIAGNOSIS — I110 Hypertensive heart disease with heart failure: Secondary | ICD-10-CM | POA: Diagnosis not present

## 2017-10-06 ENCOUNTER — Ambulatory Visit (HOSPITAL_COMMUNITY): Payer: PPO

## 2017-10-06 ENCOUNTER — Ambulatory Visit (HOSPITAL_COMMUNITY)
Admission: RE | Admit: 2017-10-06 | Discharge: 2017-10-06 | Disposition: A | Discharge status: Home / Self Care | Payer: PPO | Source: Ambulatory Visit | Attending: Cardiology | Admitting: Cardiology

## 2017-10-06 DIAGNOSIS — R0600 Dyspnea, unspecified: Secondary | ICD-10-CM

## 2017-10-06 DIAGNOSIS — I700 Atherosclerosis of aorta: Secondary | ICD-10-CM | POA: Insufficient documentation

## 2017-10-06 DIAGNOSIS — I5032 Chronic diastolic (congestive) heart failure: Secondary | ICD-10-CM | POA: Insufficient documentation

## 2017-10-06 DIAGNOSIS — R079 Chest pain, unspecified: Secondary | ICD-10-CM | POA: Diagnosis not present

## 2017-10-06 DIAGNOSIS — I110 Hypertensive heart disease with heart failure: Secondary | ICD-10-CM | POA: Diagnosis not present

## 2017-10-06 DIAGNOSIS — R0609 Other forms of dyspnea: Secondary | ICD-10-CM

## 2017-10-06 MED ORDER — NITROGLYCERIN 0.4 MG SL SUBL
0.8000 mg | SUBLINGUAL_TABLET | Freq: Once | SUBLINGUAL | Status: AC
  Administered 2017-10-06: 0.8 mg via SUBLINGUAL
  Filled 2017-10-06: qty 25

## 2017-10-06 MED ORDER — IOPAMIDOL (ISOVUE-370) INJECTION 76%
100.0000 mL | Freq: Once | INTRAVENOUS | Status: AC | PRN
  Administered 2017-10-06: 80 mL via INTRAVENOUS

## 2017-10-06 MED ORDER — NITROGLYCERIN 0.4 MG SL SUBL
SUBLINGUAL_TABLET | SUBLINGUAL | Status: AC
  Administered 2017-10-06: 0.8 mg via SUBLINGUAL
  Filled 2017-10-06: qty 2

## 2017-10-10 LAB — BASIC METABOLIC PANEL
BUN/Creatinine Ratio: 16 (ref 12–28)
BUN: 26 mg/dL (ref 8–27)
CALCIUM: 9.5 mg/dL (ref 8.7–10.3)
CHLORIDE: 105 mmol/L (ref 96–106)
CO2: 26 mmol/L (ref 20–29)
Creatinine, Ser: 1.62 mg/dL — ABNORMAL HIGH (ref 0.57–1.00)
GFR calc non Af Amer: 33 mL/min/{1.73_m2} — ABNORMAL LOW (ref >59)
GFR, EST AFRICAN AMERICAN: 38 mL/min/{1.73_m2} — AB (ref >59)
Glucose: 105 mg/dL — ABNORMAL HIGH (ref 65–99)
POTASSIUM: 4.7 mmol/L (ref 3.5–5.2)
Sodium: 144 mmol/L (ref 134–144)

## 2017-10-12 ENCOUNTER — Other Ambulatory Visit: Payer: Self-pay | Admitting: Nurse Practitioner

## 2017-10-12 DIAGNOSIS — Z1231 Encounter for screening mammogram for malignant neoplasm of breast: Secondary | ICD-10-CM

## 2017-10-16 LAB — POCT I-STAT CREATININE: Creatinine, Ser: 1.3 mg/dL — ABNORMAL HIGH (ref 0.44–1.00)

## 2017-10-23 ENCOUNTER — Ambulatory Visit: Payer: PPO | Admitting: Cardiology

## 2017-10-23 ENCOUNTER — Encounter: Payer: Self-pay | Admitting: Cardiology

## 2017-10-23 VITALS — BP 128/66 | HR 59 | Ht 68.0 in | Wt 353.8 lb

## 2017-10-23 DIAGNOSIS — R0609 Other forms of dyspnea: Secondary | ICD-10-CM

## 2017-10-23 DIAGNOSIS — E785 Hyperlipidemia, unspecified: Secondary | ICD-10-CM | POA: Diagnosis not present

## 2017-10-23 DIAGNOSIS — I5032 Chronic diastolic (congestive) heart failure: Secondary | ICD-10-CM

## 2017-10-23 DIAGNOSIS — R0600 Dyspnea, unspecified: Secondary | ICD-10-CM

## 2017-10-23 DIAGNOSIS — I110 Hypertensive heart disease with heart failure: Secondary | ICD-10-CM

## 2017-10-23 NOTE — Progress Notes (Signed)
PCP: Flossie Buffy, NP  Clinic Note: Chief Complaint  Patient presents with  . Follow-up    pt reports no complaints  . Hypertension    Hypertensive heart disease  . Shortness of Breath    Notably improved    HPI: Stacey Page is a 67 y.o. female with a PMH notable for Morbid Obesity, HFpEF (HTN Heart Dz), hypertension, hyperlipidemia and diabetes along with OSA on CPAP as well as asthma.  She presents today for 58-month follow-up after Coronary CTAngiogram.  She was initially seen on March 23, 2017 by Rosaria Ferries, PA - evaluation of CHF w/ c/o wgt gain and SOB.  + 40 LB since Feb 2018 knee surgery.  Noted exertional dyspnea with walking and feels tired all the time. compliant with CPAP.  ->  Thought to be mildly overloaded from a volume standpoint.    2D echo ordered; Told to increase Lasix to double dose for 2 days a week.  Discussed dietary discretion -Daily weights.   Converted from labetalol to metoprolol (presumably .because of concern about renal function).  Dose reduced to 50 mg twice daily --> because of concerns of bradycardia.  Stacey Page was last seen on August 18, 2017 -> still noted short of breath walking.  She still notes mild puffy edema.  Sleep sitting up with CPAP.  Not related.  Does not really exercise. -->  We ordered a coronary CT angiogram to exclude ischemic CAD.  Recent Hospitalizations: None  Studies Personally Reviewed - (if available, images/films reviewed: From Epic Chart or Care Everywhere)  Coronary CTA: Coronary calcium score 11, no obstructive CAD.  Interval History: [place] presents for follow-up noting that her breathing is notably improved.  She still gets a little short of breath, but was able to walk into the exam room today without any difficulty.  She does get short of breath if she tries to go fast or does exercise, but she is doing the best she can to try to get back into an exercise program.  She joined the gym and is  hoping to start going with her grandson.  Edema seems to be better -- still a pit "puffy", & still sleeps sitting up a bit - uses CPAP.  No rapid / irregular HR, but does note occasionally feeling dizzy-- poor balance. Still not able to loose weight --hoping that when she starts exercising this will happen. No syncope or near syncope, TIA or amaurosis fugax symptoms. Does not really walk enough to note claudication --she does note that her feet hurt her a lot when she walks, but not her legs -- More consistent with neuropathy then claudication.  ROS: A comprehensive was performed. Review of Systems  Constitutional: Negative for weight loss (In fact she gained).  HENT: Negative for congestion and nosebleeds.   Respiratory: Negative for cough and wheezing.   Gastrointestinal: Negative for abdominal pain, blood in stool and melena.  Genitourinary: Negative for hematuria.  Musculoskeletal: Positive for joint pain (Postop knee surgery pain; bilateral foot pain).  Neurological: Positive for dizziness.  Psychiatric/Behavioral: Negative for depression and memory loss. The patient is not nervous/anxious and does not have insomnia.   All other systems reviewed and are negative.  I have reviewed and (if needed) personally updated the patient's problem list, medications, allergies, past medical and surgical history, social and family history.   Past Medical History:  Diagnosis Date  . Anxiety    doesn't take any meds  . Arthritis of right knee 03/14/2016  .  Asthma   . Chronic diastolic CHF (congestive heart failure) (HCC)    HF with Preserved EF (60-65%) - Grade II Diastolic Dysfunction (Hypertensive Heart Disease). takes Furosemide daily  . COPD (chronic obstructive pulmonary disease) (HCC)    Albuterol as needed  . Depression    doesn't take meds  . Diabetes ([place])    takes Januvia daily  . Eczema    uses cream as needed  . GERD (gastroesophageal reflux disease)   . History of bronchitis as  a child   . HTN (hypertension)   . Insomnia   . Mild aortic stenosis by prior echocardiogram 04/2017   Mild stenosis: Mean gradient 15 mmHg, peak gradient 28 mmHg  . OA (osteoarthritis)   . OSA (obstructive sleep apnea) 02/25/2014   wears CPAP at night  . Peripheral neuropathy    takes Gabapentin as needed  . Pneumonia    hx of-2010  . Seasonal allergies    uses Flonase daily    Past Surgical History:  Procedure Laterality Date  . CESAREAN SECTION  x2  . COLONOSCOPY    . CORONARY CALCIUM SCORE & CT ANGIOGRAM  09/2017   Coronary Ca Score = 11 (low).  CTA- no obstructive CAD (minimal disease)  . JOINT REPLACEMENT    . KNEE ARTHROSCOPY Right   . LUNG BIOPSY Right 06/10/2014   Procedure: LUNG BIOPSY;  Surgeon: Ivin Poot, MD;  Location: [facility];  Service: Thoracic;  Laterality: Right;  . POLYPECTOMY     throat  . TOTAL KNEE ARTHROPLASTY Right 03/14/2016   Procedure: RIGHT TOTAL KNEE ARTHROPLASTY;  Surgeon: Marybelle Killings, MD;  Location: [facility];  Service: Orthopedics;  Laterality: Right;  . TRANSTHORACIC ECHOCARDIOGRAM  03/2017    EF 60-65% moderate concentric LVH GRII DD.  Mild aortic stenosis (mean gradient 15 mmHg, peak gradient 28 mmHg).  . VIDEO ASSISTED THORACOSCOPY Right 06/10/2014   Procedure: VIDEO ASSISTED THORACOSCOPY;  Surgeon: Ivin Poot, MD;  Location: [facility] OR;  Service: Thoracic;  Laterality: Right;    Current Meds  Medication Sig  . albuterol (PROAIR HFA) 108 (90 Base) MCG/ACT inhaler Inhale 2 puffs into the lungs every 6 (six) hours as needed for wheezing or shortness of breath.  Marland Kitchen aspirin 81 MG tablet Take 1 tablet (81 mg total) by mouth daily with breakfast.  . atorvastatin (LIPITOR) 20 MG tablet TAKE 1 TABLET (20 MG TOTAL) BY MOUTH DAILY AT 6 PM.  . Calcium Carbonate-Vitamin D (CALCIUM 600+D) 600-400 MG-UNIT tablet Take 1 tablet by mouth daily.  . carvedilol (COREG) 12.5 MG tablet Take 1.5 tablets (18.75 mg total) by mouth 2 (two) times daily.  . cholecalciferol  (VITAMIN D) 1000 units tablet Take 1,000 Units by mouth daily.  . Clobetasol Prop Emollient Base (CLOBETASOL PROPIONATE E) 0.05 % emollient cream Apply 1 application topically daily as needed (skin irritations).  . ferrous sulfate (FEROSUL) 325 (65 FE) MG tablet Take 325 mg by mouth daily with breakfast.  . furosemide (LASIX) 40 MG tablet Take 40 mg daily by mouth , may take an additional 40 mg at 2 pm if increase swelling and /or shortness of breathe  . lisinopril (PRINIVIL,ZESTRIL) 20 MG tablet Take 1 tablet (20 mg total) by mouth daily.  . mometasone (NASONEX) 50 MCG/ACT nasal spray Place 2 sprays into the nose daily.  . montelukast (SINGULAIR) 10 MG tablet TAKE 1 TABLET (10 MG TOTAL) BY MOUTH AT BEDTIME.  . sitaGLIPtin (JANUVIA) 100 MG tablet Take 1 tablet (100 mg  total) by mouth daily.    Allergies  Allergen Reactions  . No Known Allergies     Social History   Tobacco Use  . Smoking status: Former Smoker    Packs/day: 0.25    Years: 15.00    Pack years: 3.75    Types: Cigarettes    Last attempt to quit: 02/08/1980    Years since quitting: 37.7  . Smokeless tobacco: Former Systems developer  . Tobacco comment: 2 packs per week  Substance Use Topics  . Alcohol use: Yes    Alcohol/week: 0.0 standard drinks    Comment: occassional/social/rare  . Drug use: No   Social History   Social History Narrative  . Not on file   ` family history includes Anemia in her father; COPD in her father; Cancer (age of onset: 55) in her mother; Hypothyroidism in her father.  Wt Readings from Last 3 Encounters:  10/23/17 (!) 353 lb 12.8 oz (160.5 kg)  08/18/17 (!) 353 lb 9.6 oz (160.4 kg)  06/28/17 (!) 350 lb 6.4 oz (158.9 kg)    PHYSICAL EXAM BP 128/66   Pulse (!) 59   Ht 5\' 8"  (1.727 m)   Wt (!) 353 lb 12.8 oz (160.5 kg)   BMI 53.80 kg/m    Follow-up BP after 5 min rest  119/61 mHg Physical Exam  Constitutional: She is oriented to person, place, and time. No distress.  Morbidly obese woman.   Well-groomed.  HENT:  Head: Normocephalic and atraumatic.  Neck: Normal range of motion. Neck supple. No hepatojugular reflux (Unable to assess) and no JVD (unable to assess) present. Carotid bruit is not present (Radiated aortic murmur).  Unable to assess due to body habitus  Cardiovascular: Normal rate, regular rhythm, S1 normal and S2 normal.  No extrasystoles are present. PMI is not displaced (Unable to assess). Exam reveals distant heart sounds and decreased pulses (More because of edema.  Difficult to palpate.). Exam reveals no gallop and no friction rub.  Murmur heard.  Harsh crescendo-decrescendo midsystolic murmur is present with a grade of 2/6 at the upper right sternal border radiating to the neck. Pulmonary/Chest: Effort normal and breath sounds normal. She has no wheezes. She has no rales.  Increased work of breathing, but nondistressed.  Abdominal: Soft. Bowel sounds are normal. She exhibits no distension. There is no tenderness. There is no rebound.  Morbidly obese.  Unable to assess HSM  Musculoskeletal: Normal range of motion. She exhibits edema (1+ bilateral -puffy and not pitting).  Neurological: She is alert and oriented to person, place, and time.  Psychiatric: She has a normal mood and affect. Her behavior is normal. Thought content normal.  Nursing note and vitals reviewed.   Adult ECG Report Sinus bradycardia, rate 57.  Lateral T wave inversions noted, but otherwise normal axis, intervals and durations. -Relatively stable EKG  Other studies Reviewed: Additional studies/ records that were reviewed today include:  Recent Labs:   Lab Results  Component Value Date   CREATININE 1.30 (H) 10/06/2017   BUN 26 09/29/2017   NA 144 09/29/2017   K 4.7 09/29/2017   CL 105 09/29/2017   CO2 26 09/29/2017   Lab Results  Component Value Date   CHOL 146 01/19/2017   HDL 62.10 01/19/2017   LDLCALC 71 01/19/2017   TRIG 67.0 01/19/2017   CHOLHDL 2 01/19/2017    ASSESSMENT  / PLAN: Problem List Items Addressed This Visit    DOE (dyspnea on exertion)    Notably improved.  Likely multifactorial with weight and obesity hypoventilation syndrome as components, but also probably related to diastolic dysfunction.  With blood pressure control being improved and volume removal for diuresis, she is notably improved.  Hopefully this will continue to improve as she continues exercising.      Relevant Orders   EKG 12-Lead (Completed)   Hyperlipidemia LDL goal <70 (Chronic)    Is on statin.  Monitored by PCP.  Well-controlled last year.  Labs should be pending soon.      Hypertensive heart disease with chronic diastolic congestive heart failure (HCC) - Primary (Chronic)    Stable weights now.  We reiterated the importance of sliding scale with her Lasix.  Since going up to 40 mg dosing in the morning, she may be does her PRN afternoon dose 2 days a week at the most. Blood pressure has improved dramatically since switching to carvedilol plus her lisinopril.  I think also volume removal is helped.  Continue current medications as her blood pressure looks great today.      Relevant Orders   EKG 12-Lead (Completed)   Morbid obesity ([place]) (Chronic)    We talked about working to lose weight.  She does need to maintain her sleep apnea therapy. I do suspect she has obesity hypoventilation. Hopefully if she does start exercising, she will start to lose weight.  We also talked her how important is to adjust her diet which she is having some difficulties with. May recommend patient coaching for nutrition.         Current medicines are reviewed at length with the patient today. (+/- concerns) n/a The following changes have been made: see below   Patient Instructions  NO [place].     Your physician wants you to follow-up in [place]. You will receive a reminder letter in the mail two months in  advance. If you don't receive a letter, please call our office to schedule the follow-up appointment.    Your physician wants you to follow-up in [place]. You will receive a reminder letter in the mail two months in advance. If you don't receive a letter, please call our office to schedule the follow-up appointment.    If you need a refill on your cardiac medications before your next appointment, please call your pharmacy.     Studies Ordered:   Orders Placed This Encounter  Procedures  . EKG 12-Lead      Glenetta Hew, M.D., M.S. Interventional Cardiologist   Pager # [phone] Phone # [phone] [address] [place], [place] [address]   Thank you for choosing [facility] at [facility]!!

## 2017-10-23 NOTE — Patient Instructions (Signed)
NO MEDICATON CHANGES   CONTINUE USING SLIDING SCALE FOR WEIGHT GAIN.     Your physician wants you to follow-up in [place]. You will receive a reminder letter in the mail two months in advance. If you don't receive a letter, please call our office to schedule the follow-up appointment.    Your physician wants you to follow-up in [place]. You will receive a reminder letter in the mail two months in advance. If you don't receive a letter, please call our office to schedule the follow-up appointment.    If you need a refill on your cardiac medications before your next appointment, please call your pharmacy.

## 2017-10-25 ENCOUNTER — Encounter: Payer: Self-pay | Admitting: Cardiology

## 2017-10-25 NOTE — Assessment & Plan Note (Signed)
We talked about working to lose weight.  She does need to maintain her sleep apnea therapy. I do suspect she has obesity hypoventilation. Hopefully if she does start exercising, she will start to lose weight.  We also talked her how important is to adjust her diet which she is having some difficulties with. May recommend patient coaching for nutrition.

## 2017-10-25 NOTE — Assessment & Plan Note (Signed)
Notably improved.  Likely multifactorial with weight and obesity hypoventilation syndrome as components, but also probably related to diastolic dysfunction.  With blood pressure control being improved and volume removal for diuresis, she is notably improved.  Hopefully this will continue to improve as she continues exercising.

## 2017-10-25 NOTE — Assessment & Plan Note (Signed)
Stable weights now.  We reiterated the importance of sliding scale with her Lasix.  Since going up to 40 mg dosing in the morning, she may be does her PRN afternoon dose 2 days a week at the most. Blood pressure has improved dramatically since switching to carvedilol plus her lisinopril.  I think also volume removal is helped.  Continue current medications as her blood pressure looks great today.

## 2017-10-25 NOTE — Assessment & Plan Note (Signed)
Is on statin.  Monitored by PCP.  Well-controlled last year.  Labs should be pending soon.

## 2017-10-27 ENCOUNTER — Telehealth: Payer: Self-pay | Admitting: Cardiology

## 2017-10-27 ENCOUNTER — Encounter: Payer: Self-pay | Admitting: Cardiology

## 2017-10-27 NOTE — Telephone Encounter (Signed)
New Message        Per patient she needs a letter for "Social Services stating that she is capable/physical able to take care of her 66 year old grandson. Pls fax letter to [phone] make attention to Ms. Laurell Josephs. Patient would like a call when done. Please and thank you.

## 2017-10-27 NOTE — Progress Notes (Signed)
  I was asked To write a letter indicating that she is physically capable of caring for her grandkids.    During her recent visit, she was doing notably better.  Her exertional dyspnea had significantly improved as had her swelling.  Glenetta Hew, MD

## 2017-10-27 NOTE — Telephone Encounter (Signed)
Returned call to clarify what is needed.   She states she needs a letter stating she is capable and physically able from a cardiac standpoint to care for her grandson.   She request this to be faxed to # provided but also request call back when this is completed.     Routed to MD to address

## 2017-10-27 NOTE — Telephone Encounter (Signed)
Letter dictated & printed out.  Glenetta Hew, MD

## 2017-10-27 NOTE — Telephone Encounter (Signed)
Letter faxed via Epic and made patient aware

## 2017-11-15 ENCOUNTER — Other Ambulatory Visit: Payer: Self-pay | Admitting: Podiatry

## 2017-11-15 ENCOUNTER — Ambulatory Visit (INDEPENDENT_AMBULATORY_CARE_PROVIDER_SITE_OTHER): Payer: PPO

## 2017-11-15 ENCOUNTER — Encounter: Payer: Self-pay | Admitting: Podiatry

## 2017-11-15 ENCOUNTER — Ambulatory Visit: Payer: PPO | Admitting: Podiatry

## 2017-11-15 DIAGNOSIS — M722 Plantar fascial fibromatosis: Secondary | ICD-10-CM

## 2017-11-15 DIAGNOSIS — M79672 Pain in left foot: Secondary | ICD-10-CM | POA: Diagnosis not present

## 2017-11-15 DIAGNOSIS — M79671 Pain in right foot: Secondary | ICD-10-CM | POA: Diagnosis not present

## 2017-11-15 MED ORDER — TRIAMCINOLONE ACETONIDE 10 MG/ML IJ SUSP
10.0000 mg | Freq: Once | INTRAMUSCULAR | Status: AC
  Administered 2017-11-15: 10 mg

## 2017-11-19 NOTE — Progress Notes (Signed)
Subjective:   Patient ID: Stacey Page, female   DOB: 67 y.o.   MRN: [id number]   HPI Patient presents with heel pain bilateral with the left being worse than the right and states she is trying to be more active and is been present for several months.  Worse when she gets up in the morning after periods of sitting   ROS      Objective:  Physical Exam  Neurovascular status intact with exquisite discomfort plantar aspect heel left over right with inflammation fluid of the medial band     Assessment:  Chronic plantar fasciitis bilateral with inflammation fluid of the medial band     Plan:  Went ahead today and injected the left plantar fascia 3 mg Kenalog 5 mg Xylocaine and dispense fascial brace is bilateral to try to lift the arch discussed obesity also is complicating factor and advised on shoe gear modification  X-rays indicate spur but no indications of stress fracture arthritis

## 2017-11-23 ENCOUNTER — Other Ambulatory Visit: Payer: Self-pay | Admitting: Nurse Practitioner

## 2017-11-24 ENCOUNTER — Other Ambulatory Visit: Payer: Self-pay | Admitting: Nurse Practitioner

## 2017-11-24 ENCOUNTER — Ambulatory Visit: Payer: Self-pay | Admitting: Family Medicine

## 2017-11-24 DIAGNOSIS — E785 Hyperlipidemia, unspecified: Secondary | ICD-10-CM

## 2017-11-24 MED ORDER — ATORVASTATIN CALCIUM 20 MG PO TABS: 20.0000 mg | ORAL_TABLET | Freq: Every day | ORAL | 3 refills | Status: DC

## 2017-11-24 NOTE — Telephone Encounter (Signed)
Copied from [place] [id number]. Topic: Quick Communication - Rx Refill/Question >> Nov 24, 2017 10:57 AM Bea Graff, NT wrote: Medication: atorvastatin (LIPITOR) 20 MG tablet   Has the patient contacted their pharmacy? Yes.   (Agent: If no, request that the patient contact the pharmacy for the refill.) (Agent: If yes, when and what did the pharmacy advise?)  Preferred Pharmacy (with phone number or street name): [id number] [address], [place]. [phone] (Phone) [phone] (Fax)    Agent: Please be advised that RX refills may take up to 3 business days. We ask that you follow-up with your pharmacy.

## 2017-11-24 NOTE — Telephone Encounter (Signed)
Appointment scheduled for today for refills. Requested Prescriptions  Pending Prescriptions Disp Refills  . atorvastatin (LIPITOR) 20 MG tablet 90 tablet 3    Sig: Take 1 tablet (20 mg total) by mouth daily at 6 PM.     Cardiovascular:  Antilipid - Statins Passed - 11/24/2017 10:59 AM      Passed - Total Cholesterol in normal range and within 360 days    Cholesterol  Date Value Ref Range Status  01/19/2017 146 0 - 200 mg/dL Final    Comment:    ATP III Classification       Desirable:  < 200 mg/dL               Borderline High:  200 - 239 mg/dL          High:  > = 240 mg/dL         Passed - LDL in normal range and within 360 days    LDL Cholesterol  Date Value Ref Range Status  01/19/2017 71 0 - 99 mg/dL Final         Passed - HDL in normal range and within 360 days    HDL  Date Value Ref Range Status  01/19/2017 62.10 >39.00 mg/dL Final         Passed - Triglycerides in normal range and within 360 days    Triglycerides  Date Value Ref Range Status  01/19/2017 67.0 0.0 - 149.0 mg/dL Final    Comment:    Normal:  <150 mg/dLBorderline High:  150 - 199 mg/dL         Passed - Patient is not pregnant      Passed - Valid encounter within last 12 months    Recent Outpatient Visits          7 months ago Type 2 diabetes mellitus with diabetic mononeuropathy, without long-term current use of insulin ([place])   [facility] [name], [place], NP   8 months ago CKD (chronic kidney disease) stage 3, GFR 30-59 ml/min ([place])   [facility], [name], NP   10 months ago Essential hypertension   [name], [name], NP   1 year ago Essential hypertension   [place], [name], NP   1 year ago Essential hypertension   [place], [name], NP      Future Appointments            Today [place], Garvin Fila, DO [facility] [place], [place]   In 7 months [place], Hildred Priest, RN [facility] [place], [place]

## 2017-12-04 ENCOUNTER — Ambulatory Visit
Admission: RE | Admit: 2017-12-04 | Discharge: 2017-12-04 | Disposition: A | Discharge status: Home / Self Care | Payer: PPO | Source: Ambulatory Visit | Attending: Nurse Practitioner | Admitting: Nurse Practitioner

## 2017-12-04 DIAGNOSIS — Z1231 Encounter for screening mammogram for malignant neoplasm of breast: Secondary | ICD-10-CM

## 2017-12-06 ENCOUNTER — Ambulatory Visit: Payer: PPO | Admitting: Podiatry

## 2017-12-06 ENCOUNTER — Encounter: Payer: Self-pay | Admitting: Podiatry

## 2017-12-06 DIAGNOSIS — M722 Plantar fascial fibromatosis: Secondary | ICD-10-CM

## 2017-12-06 NOTE — Progress Notes (Signed)
Subjective:   Patient ID: Stacey Page, female   DOB: 67 y.o.   MRN: [id number]   HPI Patient states doing much better with minimal discomfort and able to walk without pain   ROS      Objective:  Physical Exam  Neurovascular status intact with significant diminishment of discomfort plantar heel bilateral     Assessment:  Doing good with chronic plantar fascial symptomatology bilaterally with good response to medication     Plan:  Reviewed continue brace usage supportive shoe gear usage physical therapy and shoe gear modifications.  Spent a great deal time going over plantar fasciitis possibility for further treatment depending on response

## 2018-01-16 ENCOUNTER — Encounter: Payer: Self-pay | Admitting: Nurse Practitioner

## 2018-01-16 ENCOUNTER — Ambulatory Visit (INDEPENDENT_AMBULATORY_CARE_PROVIDER_SITE_OTHER): Payer: PPO | Admitting: Nurse Practitioner

## 2018-01-16 VITALS — BP 144/64 | HR 65 | Temp 97.6°F | Ht 68.0 in | Wt 357.0 lb

## 2018-01-16 DIAGNOSIS — I10 Essential (primary) hypertension: Secondary | ICD-10-CM

## 2018-01-16 DIAGNOSIS — E785 Hyperlipidemia, unspecified: Secondary | ICD-10-CM | POA: Diagnosis not present

## 2018-01-16 DIAGNOSIS — N183 Chronic kidney disease, stage 3 unspecified: Secondary | ICD-10-CM

## 2018-01-16 DIAGNOSIS — G63 Polyneuropathy in diseases classified elsewhere: Secondary | ICD-10-CM | POA: Diagnosis not present

## 2018-01-16 DIAGNOSIS — E1141 Type 2 diabetes mellitus with diabetic mononeuropathy: Secondary | ICD-10-CM

## 2018-01-16 LAB — BASIC METABOLIC PANEL
BUN: 19 mg/dL (ref 6–23)
CALCIUM: 10.1 mg/dL (ref 8.4–10.5)
CO2: 31 mEq/L (ref 19–32)
Chloride: 102 mEq/L (ref 96–112)
Creatinine, Ser: 1.34 mg/dL — ABNORMAL HIGH (ref 0.40–1.20)
GFR: 50.87 mL/min — AB (ref >60.00)
GLUCOSE: 101 mg/dL — AB (ref 70–99)
POTASSIUM: 4.9 meq/L (ref 3.5–5.1)
SODIUM: 141 meq/L (ref 135–145)

## 2018-01-16 LAB — CBC
HEMATOCRIT: 40.6 % (ref 36.0–46.0)
Hemoglobin: 13 g/dL (ref 12.0–15.0)
MCHC: 32 g/dL (ref 30.0–36.0)
MCV: 91 fl (ref 78.0–100.0)
PLATELETS: 235 10*3/uL (ref 150.0–400.0)
RBC: 4.46 Mil/uL (ref 3.87–5.11)
RDW: 15.4 % (ref 11.5–15.5)
WBC: 5.6 10*3/uL (ref 4.0–10.5)

## 2018-01-16 LAB — MICROALBUMIN / CREATININE URINE RATIO
Creatinine,U: 123.9 mg/dL
MICROALB UR: 1.9 mg/dL (ref 0.0–1.9)
Microalb Creat Ratio: 1.5 mg/g (ref 0.0–30.0)

## 2018-01-16 LAB — HEPATIC FUNCTION PANEL
ALT: 11 U/L (ref 0–35)
AST: 14 U/L (ref 0–37)
Albumin: 4.2 g/dL (ref 3.5–5.2)
Alkaline Phosphatase: 71 U/L (ref 39–117)
BILIRUBIN TOTAL: 0.5 mg/dL (ref 0.2–1.2)
Bilirubin, Direct: 0.1 mg/dL (ref 0.0–0.3)
Total Protein: 8.2 g/dL (ref 6.0–8.3)

## 2018-01-16 LAB — LIPID PANEL
CHOLESTEROL: 132 mg/dL (ref 0–200)
HDL: 55.6 mg/dL (ref >39.00)
LDL Cholesterol: 60 mg/dL (ref 0–99)
NONHDL: 76.64
Total CHOL/HDL Ratio: 2
Triglycerides: 81 mg/dL (ref 0.0–149.0)
VLDL: 16.2 mg/dL (ref 0.0–40.0)

## 2018-01-16 LAB — HEMOGLOBIN A1C: Hgb A1c MFr Bld: 6.2 % (ref 4.6–6.5)

## 2018-01-16 NOTE — Progress Notes (Signed)
Subjective:  Patient ID: Stacey Page, female    DOB: [birth year]  Age: 67 y.o. MRN: [id number]  CC: Follow-up (follow up on meds, fasting/ pt assistant paper work fill out for [place] and nasonex. )   HPI  HTN: Improved with use of amlodipine, coreg, furosemide and lisinopril. Use of CPAP daily per patient. Walking some (15-65mins per day). Maintains low sodium diet BP Readings from Last 3 Encounters:  01/16/18 (!) 144/64  10/23/17 128/66  10/06/17 (!) 159/84   DM: Does not check glucose at home. Has not scheduled appt for eye exam. Current use of januvia Minimal exercise. Reports she is trying to stay from sugar and bread.  Hyperlipidemia: LDL at goal with lipitor Lipid Panel     Component Value Date/Time   CHOL 132 01/16/2018 1117   TRIG 81.0 01/16/2018 1117   HDL 55.60 01/16/2018 1117   CHOLHDL 2 01/16/2018 1117   VLDL 16.2 01/16/2018 1117   LDLCALC 60 01/16/2018 1117   Reviewed past Medical, Social and Family history today.  Outpatient Medications Prior to Visit  Medication Sig Dispense Refill  . albuterol (PROAIR HFA) 108 (90 Base) MCG/ACT inhaler Inhale 2 puffs into the lungs every 6 (six) hours as needed for wheezing or shortness of breath. 1 Inhaler 3  . aspirin 81 MG tablet Take 1 tablet (81 mg total) by mouth daily with breakfast. 90 tablet 0  . Calcium Carbonate-Vitamin D (CALCIUM 600+D) 600-400 MG-UNIT tablet Take 1 tablet by mouth daily.    . cholecalciferol (VITAMIN D) 1000 units tablet Take 1,000 Units by mouth daily.    . Clobetasol Prop Emollient Base (CLOBETASOL PROPIONATE E) 0.05 % emollient cream Apply 1 application topically daily as needed (skin irritations). 30 g 2  . ferrous sulfate (FEROSUL) 325 (65 FE) MG tablet Take 325 mg by mouth daily with breakfast.    . furosemide (LASIX) 40 MG tablet Take 40 mg daily by mouth , may take an additional 40 mg at 2 pm if increase swelling and /or shortness of breathe 180 tablet 3  . mometasone  (NASONEX) 50 MCG/ACT nasal spray Place 2 sprays into the nose daily. 17 g 11  . atorvastatin (LIPITOR) 20 MG tablet TAKE 1 TABLET (20 MG TOTAL) BY MOUTH DAILY AT 6 PM. 90 tablet 0  . lisinopril (PRINIVIL,ZESTRIL) 20 MG tablet Take 1 tablet (20 mg total) by mouth daily. 90 tablet 1  . montelukast (SINGULAIR) 10 MG tablet TAKE 1 TABLET BY MOUTH EVERYDAY AT BEDTIME 90 tablet 1  . sitaGLIPtin (JANUVIA) 100 MG tablet Take 1 tablet (100 mg total) by mouth daily. 14 tablet 0  . carvedilol (COREG) 12.5 MG tablet Take 1.5 tablets (18.75 mg total) by mouth 2 (two) times daily. 270 tablet 3   No facility-administered medications prior to visit.     ROS See HPI  Objective:  BP (!) 144/64   Pulse 65   Temp 97.6 F (36.4 C) (Oral)   Ht 5\' 8"  (1.727 m)   Wt (!) 357 lb (161.9 kg)   SpO2 97%   BMI 54.28 kg/m   BP Readings from Last 3 Encounters:  01/16/18 (!) 144/64  10/23/17 128/66  10/06/17 (!) 159/84    Wt Readings from Last 3 Encounters:  01/16/18 (!) 357 lb (161.9 kg)  10/23/17 (!) 353 lb 12.8 oz (160.5 kg)  08/18/17 (!) 353 lb 9.6 oz (160.4 kg)    Physical Exam  Constitutional: She is oriented to person, place, and time. No distress.  Cardiovascular: Normal rate, regular rhythm and intact distal pulses.  Murmur heard. Pulmonary/Chest: Effort normal and breath sounds normal.  Abdominal: Soft. Bowel sounds are normal.  Neurological: She is alert and oriented to person, place, and time.  Skin: Skin is warm and dry. No erythema.  Diabetic foot exam completed  Psychiatric: She has a normal mood and affect. Her behavior is normal. Thought content normal.  Vitals reviewed.   Lab Results  Component Value Date   WBC 5.6 01/16/2018   HGB 13.0 01/16/2018   HCT 40.6 01/16/2018   PLT 235.0 01/16/2018   GLUCOSE 101 (H) 01/16/2018   CHOL 132 01/16/2018   TRIG 81.0 01/16/2018   HDL 55.60 01/16/2018   LDLCALC 60 01/16/2018   ALT 11 01/16/2018   AST 14 01/16/2018   NA 141 01/16/2018     K 4.9 01/16/2018   CL 102 01/16/2018   CREATININE 1.34 (H) 01/16/2018   BUN 19 01/16/2018   CO2 31 01/16/2018   TSH 0.81 01/19/2017   INR 1.11 06/16/2014   HGBA1C 6.2 01/16/2018   MICROALBUR 1.9 01/16/2018    Mm 3d Screen Breast Bilateral  Result Date: 12/05/2017 CLINICAL DATA:  Screening. EXAM: DIGITAL SCREENING BILATERAL MAMMOGRAM WITH TOMO AND CAD COMPARISON:  Previous exam(s). ACR Breast Density Category b: There are scattered areas of fibroglandular density. FINDINGS: There are no findings suspicious for malignancy. Images were processed with CAD. IMPRESSION: No mammographic evidence of malignancy. A result letter of this screening mammogram will be mailed directly to the patient. RECOMMENDATION: Screening mammogram in one year. (Code:SM-B-01Y) BI-RADS CATEGORY  1: Negative. Electronically Signed   By: Dorise Bullion III M.D   On: 12/05/2017 12:03    Assessment & Plan:   Stacey Page was seen today for follow-up.  Diagnoses and all orders for this visit:  Essential hypertension -     CBC -     lisinopril (PRINIVIL,ZESTRIL) 20 MG tablet; Take 1 tablet (20 mg total) by mouth daily.  CKD (chronic kidney disease) stage 3, GFR 30-59 ml/min (HCC)  Hyperlipidemia LDL goal <70 -     Lipid panel -     Hepatic function panel -     atorvastatin (LIPITOR) 20 MG tablet; Take 1 tablet (20 mg total) by mouth daily at 6 PM.  Type 2 diabetes mellitus with diabetic mononeuropathy, without long-term current use of insulin (HCC) -     Hemoglobin A1c -     Basic metabolic panel -     Microalbumin / creatinine urine ratio -     DME Other see comment -     sitaGLIPtin (JANUVIA) 100 MG tablet; Take 1 tablet (100 mg total) by mouth daily.  Polyneuropathy associated with underlying disease ([place]) -     DME Other see comment  Other orders -     montelukast (SINGULAIR) 10 MG tablet; Take 1 tablet (10 mg total) by mouth at bedtime.  light increase in HgbA1c: 5.9 to 6.2. Normal urine  microalbumin Continue januvia. Maintain low carb/sugar/sodium diet. Regular exercise will also be helpful. BMP indicates stable renal function at stage 3. Normal CBC, hepatic function and lipid panel. F/up in 88months.  Provided Rx for diabetic shoes today. Wear shoes at all times due to neuropathy.  Make appt with ophthalmology. Have report faxed to me.  I have changed Stacey Page's montelukast. I am also having her maintain her Clobetasol Prop Emollient Base, albuterol, aspirin EC, mometasone, Calcium Carbonate-Vitamin D, ferrous sulfate, cholecalciferol, carvedilol, furosemide, atorvastatin, lisinopril, and sitaGLIPtin.  Meds ordered this encounter  Medications  . atorvastatin (LIPITOR) 20 MG tablet    Sig: Take 1 tablet (20 mg total) by mouth daily at 6 PM.    Dispense:  90 tablet    Refill:  3    Order Specific Question:   Supervising Provider    Answer:   Lucille Passy [[id number]]  . lisinopril (PRINIVIL,ZESTRIL) 20 MG tablet    Sig: Take 1 tablet (20 mg total) by mouth daily.    Dispense:  90 tablet    Refill:  1    Order Specific Question:   Supervising Provider    Answer:   Lucille Passy [[id number]]  . montelukast (SINGULAIR) 10 MG tablet    Sig: Take 1 tablet (10 mg total) by mouth at bedtime.    Dispense:  90 tablet    Refill:  3    Order Specific Question:   Supervising Provider    Answer:   Lucille Passy [[id number]]  . sitaGLIPtin (JANUVIA) 100 MG tablet    Sig: Take 1 tablet (100 mg total) by mouth daily.    Dispense:  90 tablet    Refill:  3    Order Specific Question:   Supervising Provider    Answer:   Lucille Passy [[id number]]    Problem List Items Addressed This Visit      Cardiovascular and Mediastinum   Essential hypertension - Primary (Chronic)   Relevant Medications   atorvastatin (LIPITOR) 20 MG tablet   lisinopril (PRINIVIL,ZESTRIL) 20 MG tablet   Other Relevant Orders   CBC (Completed)     Endocrine   DM (diabetes mellitus) ([place])   Relevant Medications    atorvastatin (LIPITOR) 20 MG tablet   lisinopril (PRINIVIL,ZESTRIL) 20 MG tablet   sitaGLIPtin (JANUVIA) 100 MG tablet   Other Relevant Orders   Hemoglobin A1c (Completed)   Basic metabolic panel (Completed)   Microalbumin / creatinine urine ratio (Completed)   DME Other see comment     Genitourinary   CKD (chronic kidney disease) stage 3, GFR 30-59 ml/min (HCC)     Other   Hyperlipidemia LDL goal <70 (Chronic)   Relevant Medications   atorvastatin (LIPITOR) 20 MG tablet   lisinopril (PRINIVIL,ZESTRIL) 20 MG tablet   Other Relevant Orders   Lipid panel (Completed)   Hepatic function panel (Completed)    Other Visit Diagnoses    Polyneuropathy associated with underlying disease ([place])       Relevant Orders   DME Other see comment       Follow-up: Return in about 6 months (around 07/18/2018) for DM and HTN, hyperlipidemia (83mins).  Wilfred Lacy, NP

## 2018-01-16 NOTE — Patient Instructions (Addendum)
Slight increase in HgbA1c: 5.9 to 6.2. Normal urine microalbumin Continue januvia. Maintain low carb/sugar/sodium diet. Regular exercise will also be helpful. BMP indicates stable renal function at stage 3. Normal CBC, hepatic function and lipid panel. F/up in 15months.  Provided Rx for diabetic shoes today. Wear shoes at all times due to neuropathy.  Make appt with ophthalmology. Have report faxed to me.   Diabetes and Foot Care Diabetes may cause you to have problems because of poor blood supply (circulation) to your feet and legs. This may cause the skin on your feet to become thinner, break easier, and heal more slowly. Your skin may become dry, and the skin may peel and crack. You may also have nerve damage in your legs and feet causing decreased feeling in them. You may not notice minor injuries to your feet that could lead to infections or more serious problems. Taking care of your feet is one of the most important things you can do for yourself. Follow these instructions at home:  Wear shoes at all times, even in the house. Do not go barefoot. Bare feet are easily injured.  Check your feet daily for blisters, cuts, and redness. If you cannot see the bottom of your feet, use a mirror or ask someone for help.  Wash your feet with warm water (do not use hot water) and mild soap. Then pat your feet and the areas between your toes until they are completely dry. Do not soak your feet as this can dry your skin.  Apply a moisturizing lotion or petroleum jelly (that does not contain alcohol and is unscented) to the skin on your feet and to dry, brittle toenails. Do not apply lotion between your toes.  Trim your toenails straight across. Do not dig under them or around the cuticle. File the edges of your nails with an emery board or nail file.  Do not cut corns or calluses or try to remove them with medicine.  Wear clean socks or stockings every day. Make sure they are not too tight. Do  not wear knee-high stockings since they may decrease blood flow to your legs.  Wear shoes that fit properly and have enough cushioning. To break in new shoes, wear them for just a few hours a day. This prevents you from injuring your feet. Always look in your shoes before you put them on to be sure there are no objects inside.  Do not cross your legs. This may decrease the blood flow to your feet.  If you find a minor scrape, cut, or break in the skin on your feet, keep it and the skin around it clean and dry. These areas may be cleansed with mild soap and water. Do not cleanse the area with peroxide, alcohol, or iodine.  When you remove an adhesive bandage, be sure not to damage the skin around it.  If you have a wound, look at it several times a day to make sure it is healing.  Do not use heating pads or hot water bottles. They may burn your skin. If you have lost feeling in your feet or legs, you may not know it is happening until it is too late.  Make sure your health care provider performs a complete foot exam at least annually or more often if you have foot problems. Report any cuts, sores, or bruises to your health care provider immediately. Contact a health care provider if:  You have an injury that is not healing.  You have cuts or breaks in the skin.  You have an ingrown nail.  You notice redness on your legs or feet.  You feel burning or tingling in your legs or feet.  You have pain or cramps in your legs and feet.  Your legs or feet are numb.  Your feet always feel cold. Get help right away if:  There is increasing redness, swelling, or pain in or around a wound.  There is a red line that goes up your leg.  Pus is coming from a wound.  You develop a fever or as directed by your health care provider.  You notice a bad smell coming from an ulcer or wound. This information is not intended to replace advice given to you by your health care provider. Make sure you  discuss any questions you have with your health care provider. Document Released: 01/22/2000 Document Revised: 07/02/2015 Document Reviewed: 07/03/2012 Elsevier Interactive Patient Education  2017 [employer].

## 2018-01-17 ENCOUNTER — Ambulatory Visit: Payer: PPO | Admitting: Orthotics

## 2018-01-17 ENCOUNTER — Ambulatory Visit: Payer: PPO | Admitting: Podiatry

## 2018-01-17 ENCOUNTER — Encounter: Payer: Self-pay | Admitting: Podiatry

## 2018-01-17 DIAGNOSIS — E0822 Diabetes mellitus due to underlying condition with diabetic chronic kidney disease: Secondary | ICD-10-CM

## 2018-01-17 DIAGNOSIS — E1142 Type 2 diabetes mellitus with diabetic polyneuropathy: Secondary | ICD-10-CM

## 2018-01-17 DIAGNOSIS — M79675 Pain in left toe(s): Secondary | ICD-10-CM | POA: Diagnosis not present

## 2018-01-17 DIAGNOSIS — N186 End stage renal disease: Secondary | ICD-10-CM

## 2018-01-17 DIAGNOSIS — M722 Plantar fascial fibromatosis: Secondary | ICD-10-CM

## 2018-01-17 DIAGNOSIS — M79674 Pain in right toe(s): Secondary | ICD-10-CM

## 2018-01-17 DIAGNOSIS — M2042 Other hammer toe(s) (acquired), left foot: Secondary | ICD-10-CM

## 2018-01-17 DIAGNOSIS — M2041 Other hammer toe(s) (acquired), right foot: Secondary | ICD-10-CM | POA: Diagnosis not present

## 2018-01-17 DIAGNOSIS — B351 Tinea unguium: Secondary | ICD-10-CM

## 2018-01-17 DIAGNOSIS — M2141 Flat foot [pes planus] (acquired), right foot: Secondary | ICD-10-CM | POA: Diagnosis not present

## 2018-01-17 DIAGNOSIS — E1151 Type 2 diabetes mellitus with diabetic peripheral angiopathy without gangrene: Secondary | ICD-10-CM

## 2018-01-17 DIAGNOSIS — M2142 Flat foot [pes planus] (acquired), left foot: Secondary | ICD-10-CM

## 2018-01-17 DIAGNOSIS — Z794 Long term (current) use of insulin: Secondary | ICD-10-CM

## 2018-01-17 DIAGNOSIS — Z992 Dependence on renal dialysis: Secondary | ICD-10-CM

## 2018-01-17 MED ORDER — SITAGLIPTIN PHOSPHATE 100 MG PO TABS: 100.0000 mg | ORAL_TABLET | Freq: Every day | ORAL | 3 refills | Status: DC

## 2018-01-17 MED ORDER — LISINOPRIL 20 MG PO TABS: 20.0000 mg | ORAL_TABLET | Freq: Every day | ORAL | 1 refills | Status: DC

## 2018-01-17 MED ORDER — MONTELUKAST SODIUM 10 MG PO TABS: 10.0000 mg | ORAL_TABLET | Freq: Every day | ORAL | 3 refills | Status: DC

## 2018-01-17 MED ORDER — ATORVASTATIN CALCIUM 20 MG PO TABS: 20.0000 mg | ORAL_TABLET | Freq: Every day | ORAL | 3 refills | Status: DC

## 2018-01-17 NOTE — Progress Notes (Signed)

## 2018-01-17 NOTE — Patient Instructions (Addendum)
1. PURCHASE GOLD BOND DIABETIC FOOT CREAM AND APPLY TO BOTH FEET TWICE DAILY  2. DO NOT PICK AT DRY SKIN ON FEET  3. HAVE DIALYSIS DOCTOR LOOK AT RIGHT LEG TOMORROW   Peripheral Vascular Disease Peripheral vascular disease (PVD) is a disease of the blood vessels that are not part of your heart and brain. A simple term for PVD is poor circulation. In most cases, PVD narrows the blood vessels that carry blood from your heart to the rest of your body. This can result in a decreased supply of blood to your arms, legs, and internal organs, like your stomach or kidneys. However, it most often affects a person's lower legs and feet. There are two types of PVD.  Organic PVD. This is the more common type. It is caused by damage to the structure of blood vessels.  Functional PVD. This is caused by conditions that make blood vessels contract and tighten (spasm).  Without treatment, PVD tends to get worse over time. PVD can also lead to acute ischemic limb. This is when an arm or limb suddenly has trouble getting enough blood. This is a medical emergency. Follow these instructions at home:  Take medicines only as told by your doctor.  Do not use any tobacco products, including cigarettes, chewing tobacco, or electronic cigarettes. If you need help quitting, ask your doctor.  Lose weight if you are overweight, and maintain a healthy weight as told by your doctor.  Eat a diet that is low in fat and cholesterol. If you need help, ask your doctor.  Exercise regularly. Ask your doctor for some good activities for you.  Take good care of your feet. ? Wear comfortable shoes that fit well. ? Check your feet often for any cuts or sores. Contact a doctor if:  You have cramps in your legs while walking.  You have leg pain when you are at rest.  You have coldness in a leg or foot.  Your skin changes.  You are unable to get or have an erection (erectile dysfunction).  You have cuts or sores on  your feet that are not healing. Get help right away if:  Your arm or leg turns cold and blue.  Your arms or legs become red, warm, swollen, painful, or numb.  You have chest pain or trouble breathing.  You suddenly have weakness in your face, arm, or leg.  You become very confused or you cannot speak.  You suddenly have a very bad headache.  You suddenly cannot see. This information is not intended to replace advice given to you by your health care provider. Make sure you discuss any questions you have with your health care provider. Document Released: 04/20/2009 Document Revised: 07/02/2015 Document Reviewed: 07/04/2013 Elsevier Interactive Patient Education  2017 [place].   Diabetes and Foot Care Diabetes may cause you to have problems because of poor blood supply (circulation) to your feet and legs. This may cause the skin on your feet to become thinner, break easier, and heal more slowly. Your skin may become dry, and the skin may peel and crack. You may also have nerve damage in your legs and feet causing decreased feeling in them. You may not notice minor injuries to your feet that could lead to infections or more serious problems. Taking care of your feet is one of the most important things you can do for yourself. Follow these instructions at home:  Wear shoes at all times, even in the house. Do not  go barefoot. Bare feet are easily injured.  Check your feet daily for blisters, cuts, and redness. If you cannot see the bottom of your feet, use a mirror or ask someone for help.  Wash your feet with warm water (do not use hot water) and mild soap. Then pat your feet and the areas between your toes until they are completely dry. Do not soak your feet as this can dry your skin.  Apply a moisturizing lotion or petroleum jelly (that does not contain alcohol and is unscented) to the skin on your feet and to dry, brittle toenails. Do not apply lotion between your toes.  Trim  your toenails straight across. Do not dig under them or around the cuticle. File the edges of your nails with an emery board or nail file.  Do not cut corns or calluses or try to remove them with medicine.  Wear clean socks or stockings every day. Make sure they are not too tight. Do not wear knee-high stockings since they may decrease blood flow to your legs.  Wear shoes that fit properly and have enough cushioning. To break in new shoes, wear them for just a few hours a day. This prevents you from injuring your feet. Always look in your shoes before you put them on to be sure there are no objects inside.  Do not cross your legs. This may decrease the blood flow to your feet.  If you find a minor scrape, cut, or break in the skin on your feet, keep it and the skin around it clean and dry. These areas may be cleansed with mild soap and water. Do not cleanse the area with peroxide, alcohol, or iodine.  When you remove an adhesive bandage, be sure not to damage the skin around it.  If you have a wound, look at it several times a day to make sure it is healing.  Do not use heating pads or hot water bottles. They may burn your skin. If you have lost feeling in your feet or legs, you may not know it is happening until it is too late.  Make sure your health care provider performs a complete foot exam at least annually or more often if you have foot problems. Report any cuts, sores, or bruises to your health care provider immediately. Contact a health care provider if:  You have an injury that is not healing.  You have cuts or breaks in the skin.  You have an ingrown nail.  You notice redness on your legs or feet.  You feel burning or tingling in your legs or feet.  You have pain or cramps in your legs and feet.  Your legs or feet are numb.  Your feet always feel cold. Get help right away if:  There is increasing redness, swelling, or pain in or around a wound.  There is a red line  that goes up your leg.  Pus is coming from a wound.  You develop a fever or as directed by your health care provider.  You notice a bad smell coming from an ulcer or wound. This information is not intended to replace advice given to you by your health care provider. Make sure you discuss any questions you have with your health care provider. Document Released: 01/22/2000 Document Revised: 07/02/2015 Document Reviewed: 07/03/2012 Elsevier Interactive Patient Education  2017 [employer].

## 2018-01-22 DIAGNOSIS — G4733 Obstructive sleep apnea (adult) (pediatric): Secondary | ICD-10-CM | POA: Diagnosis not present

## 2018-01-22 DIAGNOSIS — G4700 Insomnia, unspecified: Secondary | ICD-10-CM | POA: Diagnosis not present

## 2018-02-17 ENCOUNTER — Encounter: Payer: Self-pay | Admitting: Podiatry

## 2018-02-17 NOTE — Progress Notes (Signed)
Subjective: Stacey Page presents today for diabetic foot evaluation for the purpose of obtaining Medicare qualifying diabetic shoes.  She has h/o diabetes and diabetic neuropathy. Diabetes is managed with Januvia.  [name], [name], NP is her PCP.   Current Outpatient Medications:  .  albuterol (PROAIR HFA) 108 (90 Base) MCG/ACT inhaler, Inhale 2 puffs into the lungs every 6 (six) hours as needed for wheezing or shortness of breath., Disp: 1 Inhaler, Rfl: 3 .  aspirin 81 MG tablet, Take 1 tablet (81 mg total) by mouth daily with breakfast., Disp: 90 tablet, Rfl: 0 .  atorvastatin (LIPITOR) 20 MG tablet, Take 1 tablet (20 mg total) by mouth daily at 6 PM., Disp: 90 tablet, Rfl: 3 .  Calcium Carbonate-Vitamin D (CALCIUM 600+D) 600-400 MG-UNIT tablet, Take 1 tablet by mouth daily., Disp: , Rfl:  .  cholecalciferol (VITAMIN D) 1000 units tablet, Take 1,000 Units by mouth daily., Disp: , Rfl:  .  Clobetasol Prop Emollient Base (CLOBETASOL PROPIONATE E) 0.05 % emollient cream, Apply 1 application topically daily as needed (skin irritations)., Disp: 30 g, Rfl: 2 .  ferrous sulfate (FEROSUL) 325 (65 FE) MG tablet, Take 325 mg by mouth daily with breakfast., Disp: , Rfl:  .  furosemide (LASIX) 40 MG tablet, Take 40 mg daily by mouth , may take an additional 40 mg at 2 pm if increase swelling and /or shortness of breathe, Disp: 180 tablet, Rfl: 3 .  lisinopril (PRINIVIL,ZESTRIL) 20 MG tablet, Take 1 tablet (20 mg total) by mouth daily., Disp: 90 tablet, Rfl: 1 .  mometasone (NASONEX) 50 MCG/ACT nasal spray, Place 2 sprays into the nose daily., Disp: 17 g, Rfl: 11 .  montelukast (SINGULAIR) 10 MG tablet, Take 1 tablet (10 mg total) by mouth at bedtime., Disp: 90 tablet, Rfl: 3 .  sitaGLIPtin (JANUVIA) 100 MG tablet, Take 1 tablet (100 mg total) by mouth daily., Disp: 90 tablet, Rfl: 3 .  carvedilol (COREG) 12.5 MG tablet, Take 1.5 tablets (18.75 mg total) by mouth 2 (two) times daily., Disp: 270  tablet, Rfl: 3  Allergies  Allergen Reactions  . No Known Allergies     Vascular Examination: Capillary refill time <3 seconds x 10 digits Dorsalis pedis and Posterior tibial pulses present b/l No digital hair x 10 digits Skin temperature gradient WNL b/l  Dermatological Examination: Skin with normal turgor, texture and tone b/l  Toenails 1-5 b/l adequate length on today.  No open wounds.  No interdigital macerations.  Musculoskeletal: Muscle strength 5/5 to all LE muscle groups  Hammertoe deformity b/l 5th digits  Pes planus foot deformity  Tenderness to medial tubercle calcaneus b/l  Neurological: Sensation diminished with 10 gram monofilament.   Assessment: 1. NIDDM with Diabetic neuropathy 2. Pes planus foot deformity b/l 3. Plantar fasciitis b/l 4. Hammertoe 5th digit b/l  Plan: 1. Continue diabetic foot care principles.  2. Patient qualifies for diabetic shoes based on examination with the following diagnoses: NIDDM with Diabetic neuropathy, Pes planus foot deformity b/l, Plantar fasciitis b/l, Hammertoe 5th digit b/l 3. Patient to continue soft, supportive shoe gear 4. Patient to report any pedal injuries to medical professional  5. She will see Pedorthist on today. 6.  Patient/POA to call should there be a concern in the interim.

## 2018-03-01 ENCOUNTER — Telehealth: Payer: Self-pay | Admitting: Podiatry

## 2018-03-01 NOTE — Telephone Encounter (Signed)
Pt called and left several messages asking about status of diabetic shoes. I was seen for measure on 12.11 but no orders in safestep.

## 2018-03-02 NOTE — Telephone Encounter (Signed)
Pt came into the office and I explained that we could not find her original order and asked who her pcp was and she is seen by a NP. I explained that medicare will not allow a NP/Pa to sign the diabetic shoe paperwork and she has to be seen by the MD/DO and to call me when she has an appt. I will have to get her back in to be measured and pick out shoes again.

## 2018-03-05 ENCOUNTER — Encounter: Payer: Self-pay | Admitting: Nurse Practitioner

## 2018-03-05 ENCOUNTER — Ambulatory Visit (INDEPENDENT_AMBULATORY_CARE_PROVIDER_SITE_OTHER): Payer: PPO | Admitting: Nurse Practitioner

## 2018-03-05 VITALS — BP 154/66 | HR 62 | Temp 97.9°F | Ht 68.0 in | Wt 357.2 lb

## 2018-03-05 DIAGNOSIS — M545 Low back pain, unspecified: Secondary | ICD-10-CM

## 2018-03-05 DIAGNOSIS — R350 Frequency of micturition: Secondary | ICD-10-CM | POA: Diagnosis not present

## 2018-03-05 LAB — POCT URINALYSIS DIPSTICK
BILIRUBIN UA: NEGATIVE
Glucose, UA: NEGATIVE
KETONES UA: NEGATIVE
Leukocytes, UA: NEGATIVE
Nitrite, UA: NEGATIVE
PH UA: 6 (ref 5.0–8.0)
Protein, UA: NEGATIVE
RBC UA: NEGATIVE
Spec Grav, UA: 1.025 (ref 1.010–1.025)
UROBILINOGEN UA: 0.2 U/dL

## 2018-03-05 MED ORDER — CIPROFLOXACIN HCL 250 MG PO TABS: 250.0000 mg | ORAL_TABLET | Freq: Two times a day (BID) | ORAL | 0 refills | Status: DC

## 2018-03-05 NOTE — Progress Notes (Signed)
Subjective:  Patient ID: Stacey Page, female    DOB: Mar 05, 1950  Age: 68 y.o. MRN: [id number]  CC: Follow-up (pt is complaining of right lower back pain,yellow color urine,cant control urine/ pt also stated ear pain and sinus pressure. )  Urinary Tract Infection   This is a new problem. The current episode started in the past 7 days. The problem occurs every urination. The problem has been unchanged. The pain is at a severity of 0/10. The patient is experiencing no pain. There has been no fever. She is not sexually active. There is no history of pyelonephritis. Associated symptoms include flank pain, frequency and urgency. She has tried increased fluids and home medications for the symptoms. The treatment provided no relief.   Reviewed past Medical, Social and Family history today.  Outpatient Medications Prior to Visit  Medication Sig Dispense Refill  . albuterol (PROAIR HFA) 108 (90 Base) MCG/ACT inhaler Inhale 2 puffs into the lungs every 6 (six) hours as needed for wheezing or shortness of breath. 1 Inhaler 3  . aspirin 81 MG tablet Take 1 tablet (81 mg total) by mouth daily with breakfast. 90 tablet 0  . atorvastatin (LIPITOR) 20 MG tablet Take 1 tablet (20 mg total) by mouth daily at 6 PM. 90 tablet 3  . Calcium Carbonate-Vitamin D (CALCIUM 600+D) 600-400 MG-UNIT tablet Take 1 tablet by mouth daily.    . cholecalciferol (VITAMIN D) 1000 units tablet Take 1,000 Units by mouth daily.    . Clobetasol Prop Emollient Base (CLOBETASOL PROPIONATE E) 0.05 % emollient cream Apply 1 application topically daily as needed (skin irritations). 30 g 2  . ferrous sulfate (FEROSUL) 325 (65 FE) MG tablet Take 325 mg by mouth daily with breakfast.    . furosemide (LASIX) 40 MG tablet Take 40 mg daily by mouth , may take an additional 40 mg at 2 pm if increase swelling and /or shortness of breathe 180 tablet 3  . lisinopril (PRINIVIL,ZESTRIL) 20 MG tablet Take 1 tablet (20 mg total) by mouth daily. 90  tablet 1  . mometasone (NASONEX) 50 MCG/ACT nasal spray Place 2 sprays into the nose daily. 17 g 11  . montelukast (SINGULAIR) 10 MG tablet Take 1 tablet (10 mg total) by mouth at bedtime. 90 tablet 3  . sitaGLIPtin (JANUVIA) 100 MG tablet Take 1 tablet (100 mg total) by mouth daily. 90 tablet 3  . carvedilol (COREG) 12.5 MG tablet Take 1.5 tablets (18.75 mg total) by mouth 2 (two) times daily. 270 tablet 3   No facility-administered medications prior to visit.     ROS See HPI  Objective:  BP (!) 154/66   Pulse 62   Temp 97.9 F (36.6 C) (Oral)   Ht 5\' 8"  (1.727 m)   Wt (!) 357 lb 3.2 oz (162 kg)   SpO2 97%   BMI 54.31 kg/m   BP Readings from Last 3 Encounters:  03/05/18 (!) 154/66  01/16/18 (!) 144/64  10/23/17 128/66    Wt Readings from Last 3 Encounters:  03/05/18 (!) 357 lb 3.2 oz (162 kg)  01/16/18 (!) 357 lb (161.9 kg)  10/23/17 (!) 353 lb 12.8 oz (160.5 kg)    Physical Exam Cardiovascular:     Pulses: Normal pulses.  Pulmonary:     Effort: Pulmonary effort is normal.  Abdominal:     General: Bowel sounds are normal. There is no distension.     Palpations: Abdomen is soft.     Tenderness: There is no  abdominal tenderness. There is no right CVA tenderness or left CVA tenderness.  Neurological:     Mental Status: She is alert and oriented to person, place, and time.     Lab Results  Component Value Date   WBC 5.6 01/16/2018   HGB 13.0 01/16/2018   HCT 40.6 01/16/2018   PLT 235.0 01/16/2018   GLUCOSE 101 (H) 01/16/2018   CHOL 132 01/16/2018   TRIG 81.0 01/16/2018   HDL 55.60 01/16/2018   LDLCALC 60 01/16/2018   ALT 11 01/16/2018   AST 14 01/16/2018   NA 141 01/16/2018   K 4.9 01/16/2018   CL 102 01/16/2018   CREATININE 1.34 (H) 01/16/2018   BUN 19 01/16/2018   CO2 31 01/16/2018   TSH 0.81 01/19/2017   INR 1.11 06/16/2014   HGBA1C 6.2 01/16/2018   MICROALBUR 1.9 01/16/2018    Mm 3d Screen Breast Bilateral  Result Date: 12/05/2017 CLINICAL  DATA:  Screening. EXAM: DIGITAL SCREENING BILATERAL MAMMOGRAM WITH TOMO AND CAD COMPARISON:  Previous exam(s). ACR Breast Density Category b: There are scattered areas of fibroglandular density. FINDINGS: There are no findings suspicious for malignancy. Images were processed with CAD. IMPRESSION: No mammographic evidence of malignancy. A result letter of this screening mammogram will be mailed directly to the patient. RECOMMENDATION: Screening mammogram in one year. (Code:SM-B-01Y) BI-RADS CATEGORY  1: Negative. Electronically Signed   By: Dorise Bullion III M.D   On: 12/05/2017 12:03    Assessment & Plan:   [name] was seen today for follow-up.  Diagnoses and all orders for this visit:  Acute right-sided low back pain without sciatica -     POCT urinalysis dipstick -     Urine Culture -     ciprofloxacin (CIPRO) 250 MG tablet; Take 1 tablet (250 mg total) by mouth 2 (two) times daily.  Urine frequency -     Urine Culture -     ciprofloxacin (CIPRO) 250 MG tablet; Take 1 tablet (250 mg total) by mouth 2 (two) times daily.   I am having Stacey Page start on ciprofloxacin. I am also having her maintain her Clobetasol Prop Emollient Base, albuterol, aspirin EC, mometasone, Calcium Carbonate-Vitamin D, ferrous sulfate, cholecalciferol, carvedilol, furosemide, atorvastatin, lisinopril, montelukast, and sitaGLIPtin.  Meds ordered this encounter  Medications  . ciprofloxacin (CIPRO) 250 MG tablet    Sig: Take 1 tablet (250 mg total) by mouth 2 (two) times daily.    Dispense:  6 tablet    Refill:  0    Order Specific Question:   Supervising Provider    Answer:   MATTHEWS, CODY [[id number]]    Problem List Items Addressed This Visit    None    Visit Diagnoses    Acute right-sided low back pain without sciatica    -  Primary   Relevant Medications   ciprofloxacin (CIPRO) 250 MG tablet   Other Relevant Orders   POCT urinalysis dipstick (Completed)   Urine Culture   Urine frequency        Relevant Medications   ciprofloxacin (CIPRO) 250 MG tablet   Other Relevant Orders   Urine Culture       Follow-up: No follow-ups on file.  Wilfred Lacy, NP

## 2018-03-05 NOTE — Patient Instructions (Addendum)
You will be contacted with lab results.  Use debrox solution 4drops in each ear at bedtime x 4-5days, then return to office for ear irrigation.

## 2018-03-06 ENCOUNTER — Telehealth: Payer: Self-pay | Admitting: Podiatry

## 2018-03-06 LAB — URINE CULTURE
MICRO NUMBER:: 108666
RESULT: NO GROWTH
SPECIMEN QUALITY:: ADEQUATE

## 2018-03-06 NOTE — Telephone Encounter (Signed)
Courtney from [employer] called regarding pt and her diabetic shoes.  I explained that I have talked with pt and her pcp office and told them pt has to see a md/do per medicare guidelines  Pt currently is seen by a NP and medicare will not allow them to sign paperwork nor let the paperwork or chart note be in there name.. I told the office that if they will let me know what md/do pt is seeing and I will send paperwork to them in that providers name.

## 2018-03-08 ENCOUNTER — Ambulatory Visit (INDEPENDENT_AMBULATORY_CARE_PROVIDER_SITE_OTHER): Payer: PPO | Admitting: Family Medicine

## 2018-03-08 ENCOUNTER — Telehealth: Payer: Self-pay

## 2018-03-08 ENCOUNTER — Encounter: Payer: Self-pay | Admitting: Family Medicine

## 2018-03-08 VITALS — BP 132/80 | HR 57 | Temp 97.7°F | Ht 68.0 in | Wt 359.6 lb

## 2018-03-08 DIAGNOSIS — H6121 Impacted cerumen, right ear: Secondary | ICD-10-CM | POA: Diagnosis not present

## 2018-03-08 DIAGNOSIS — E1141 Type 2 diabetes mellitus with diabetic mononeuropathy: Secondary | ICD-10-CM

## 2018-03-08 DIAGNOSIS — J028 Acute pharyngitis due to other specified organisms: Secondary | ICD-10-CM

## 2018-03-08 DIAGNOSIS — B9789 Other viral agents as the cause of diseases classified elsewhere: Secondary | ICD-10-CM

## 2018-03-08 DIAGNOSIS — R202 Paresthesia of skin: Secondary | ICD-10-CM

## 2018-03-08 NOTE — Progress Notes (Signed)
Stacey Page is a 68 y.o. female  Chief Complaint  Patient presents with  . Pain    ear pain, got drops on monday to clean ear cause they couldnt see, and glands swollen// MD sign off on Diabetic shoe consult    HPI: Stacey Page is a 68 y.o. female here for f/u on B/L ear pain and cerumen impaction. Pt was seen by PCP 2 day ago and was given gtts to use to soften cerumen and advised to RTO in a few days. Pt still with some ear pain but mostly resolved. She notes a sore throat this AM. No fever, chills, runny nose. Nasal congestion but that is chronic and unchanged. + PND.   Pt also needs a letter from an MD/DO stating she is diabetic and needs diabetic shoes and inserts. She states recent lab (HgbA1C) needs to be included. No ulcers or open wounds on feet. She endorses neuropathy in B/L feet. She follows with podiatry for her diabetic foot care, B/L plantar fasciitis.  Past Medical History:  Diagnosis Date  . Anxiety    doesn't take any meds  . Arthritis of right knee 03/14/2016  . Asthma   . Chronic diastolic CHF (congestive heart failure) (HCC)    HF with Preserved EF (60-65%) - Grade II Diastolic Dysfunction (Hypertensive Heart Disease). takes Furosemide daily  . COPD (chronic obstructive pulmonary disease) (HCC)    Albuterol as needed  . Depression    doesn't take meds  . Diabetes ([place])    takes Januvia daily  . Eczema    uses cream as needed  . GERD (gastroesophageal reflux disease)   . History of bronchitis as a child   . HTN (hypertension)   . Insomnia   . Mild aortic stenosis by prior echocardiogram 04/2017   Mild stenosis: Mean gradient 15 mmHg, peak gradient 28 mmHg  . OA (osteoarthritis)   . OSA (obstructive sleep apnea) 02/25/2014   wears CPAP at night  . Peripheral neuropathy    takes Gabapentin as needed  . Pneumonia    hx of-2010  . Seasonal allergies    uses Flonase daily    Past Surgical History:  Procedure Laterality Date  . CESAREAN SECTION   x2  . COLONOSCOPY    . CORONARY CALCIUM SCORE & CT ANGIOGRAM  09/2017   Coronary Ca Score = 11 (low).  CTA- no obstructive CAD (minimal disease)  . JOINT REPLACEMENT    . KNEE ARTHROSCOPY Right   . LUNG BIOPSY Right 06/10/2014   Procedure: LUNG BIOPSY;  Surgeon: Ivin Poot, MD;  Location: [facility];  Service: Thoracic;  Laterality: Right;  . POLYPECTOMY     throat  . TOTAL KNEE ARTHROPLASTY Right 03/14/2016   Procedure: RIGHT TOTAL KNEE ARTHROPLASTY;  Surgeon: Marybelle Killings, MD;  Location: [facility];  Service: Orthopedics;  Laterality: Right;  . TRANSTHORACIC ECHOCARDIOGRAM  03/2017    EF 60-65% moderate concentric LVH GRII DD.  Mild aortic stenosis (mean gradient 15 mmHg, peak gradient 28 mmHg).  . VIDEO ASSISTED THORACOSCOPY Right 06/10/2014   Procedure: VIDEO ASSISTED THORACOSCOPY;  Surgeon: Ivin Poot, MD;  Location[facility];  Service: Thoracic;  Laterality: Right;    Social History   Socioeconomic History  . Marital status: Widowed    Spouse name: Not on file  . Number of children: Not on file  . Years of education: Not on file  . Highest education level: Not on file  Occupational History  . Occupation:  disabled  Social Needs  . Financial resource strain: Not on file  . Food insecurity:    Worry: Not on file    Inability: Not on file  . Transportation needs:    Medical: Not on file    Non-medical: Not on file  Tobacco Use  . Smoking status: Former Smoker    Packs/day: 0.25    Years: 15.00    Pack years: 3.75    Types: Cigarettes    Last attempt to quit: 02/08/1980    Years since quitting: 38.1  . Smokeless tobacco: Former Systems developer  . Tobacco comment: 2 packs per week  Substance and Sexual Activity  . Alcohol use: Yes    Alcohol/week: 0.0 standard drinks    Comment: occassional/social/rare  . Drug use: No  . Sexual activity: Not Currently  Lifestyle  . Physical activity:    Days per week: Not on file    Minutes per session: Not on file  . Stress: Not on file    Relationships  . Social connections:    Talks on phone: Not on file    Gets together: Not on file    Attends religious service: Not on file    Active member of club or organization: Not on file    Attends meetings of clubs or organizations: Not on file    Relationship status: Not on file  . Intimate partner violence:    Fear of current or ex partner: Not on file    Emotionally abused: Not on file    Physically abused: Not on file    Forced sexual activity: Not on file  Other Topics Concern  . Not on file  Social History Narrative  . Not on file    Family History  Problem Relation Age of Onset  . COPD Father   . Hypothyroidism Father   . Anemia Father        iron deficiency  . Cancer Mother 60       pancreatic     Immunization History  Administered Date(s) Administered  . Influenza Split 11/21/2016  . Influenza,inj,Quad PF,6+ Mos 01/12/2015, 11/19/2015  . Pneumococcal Conjugate-13 01/19/2017  . Pneumococcal Polysaccharide-23 01/12/2015    Outpatient Encounter Medications as of 03/08/2018  Medication Sig  . albuterol (PROAIR HFA) 108 (90 Base) MCG/ACT inhaler Inhale 2 puffs into the lungs every 6 (six) hours as needed for wheezing or shortness of breath.  Marland Kitchen aspirin 81 MG tablet Take 1 tablet (81 mg total) by mouth daily with breakfast.  . atorvastatin (LIPITOR) 20 MG tablet Take 1 tablet (20 mg total) by mouth daily at 6 PM.  . Calcium Carbonate-Vitamin D (CALCIUM 600+D) 600-400 MG-UNIT tablet Take 1 tablet by mouth daily.  . carvedilol (COREG) 12.5 MG tablet Take 1.5 tablets (18.75 mg total) by mouth 2 (two) times daily.  . cholecalciferol (VITAMIN D) 1000 units tablet Take 1,000 Units by mouth daily.  . ciprofloxacin (CIPRO) 250 MG tablet Take 1 tablet (250 mg total) by mouth 2 (two) times daily.  . Clobetasol Prop Emollient Base (CLOBETASOL PROPIONATE E) 0.05 % emollient cream Apply 1 application topically daily as needed (skin irritations).  . ferrous sulfate  (FEROSUL) 325 (65 FE) MG tablet Take 325 mg by mouth daily with breakfast.  . furosemide (LASIX) 40 MG tablet Take 40 mg daily by mouth , may take an additional 40 mg at 2 pm if increase swelling and /or shortness of breathe  . lisinopril (PRINIVIL,ZESTRIL) 20 MG tablet Take 1 tablet (  20 mg total) by mouth daily.  . mometasone (NASONEX) 50 MCG/ACT nasal spray Place 2 sprays into the nose daily.  . montelukast (SINGULAIR) 10 MG tablet Take 1 tablet (10 mg total) by mouth at bedtime.  . sitaGLIPtin (JANUVIA) 100 MG tablet Take 1 tablet (100 mg total) by mouth daily.   No facility-administered encounter medications on file as of 03/08/2018.      ROS: Pertinent positives and negatives noted in HPI. Remainder of ROS non-contributory    Allergies  Allergen Reactions  . No Known Allergies     BP 132/80   Pulse (!) 57   Temp 97.7 F (36.5 C) (Oral)   Ht 5\' 8"  (1.727 m)   Wt (!) 359 lb 9.6 oz (163.1 kg)   SpO2 96%   BMI 54.68 kg/m   Physical Exam  Constitutional: She is oriented to person, place, and time. She appears well-developed and well-nourished. No distress.  HENT:  Head: Normocephalic and atraumatic.  Left Ear: Tympanic membrane and ear canal normal.  Nose: No rhinorrhea. Right sinus exhibits no maxillary sinus tenderness and no frontal sinus tenderness. Left sinus exhibits no maxillary sinus tenderness and no frontal sinus tenderness.  Mouth/Throat: Oropharynx is clear and moist and mucous membranes are normal.  Lt ear with small piece of cerumen at entrance to ear canal, otherwise normal Rt ear still with cerumen impaction  Neurological: She is alert and oriented to person, place, and time. No sensory deficit.  Neurovascularly intact  Skin: Skin is warm, dry and intact.     A/P:  1. Impacted cerumen of right ear - Ear Lavage - Lt ear WNL  - Rt ear flushed and lighted curette used but pt still with significant amount of cerumen present. She will use debrox gtts for  another few days then RTO if she is still having any discomfort/pain/ear symptoms  2. Type 2 diabetes mellitus with diabetic mononeuropathy, without long-term current use of insulin (HCC) 3. Paresthesia of both feet - DM well-controlled and pt has regular f/u with PCP and podiatry - letter written (pt given 2 copies) for pt stating she has DM and would benefit from diabetic shoes and insets - f/u with PCP as regularly scheduled  4. Sore throat (viral) - symptoms since this AM - advised supportive care to include increased fluid intake, salt water gargles, throat spray/lozenges PRN - f/u if symptoms worsen or do not improve in 7-10 days

## 2018-03-08 NOTE — Telephone Encounter (Signed)
Copied from [place]. Topic: General - Other >> Mar 08, 2018  3:43 PM Leward Quan A wrote: Reason for CRM: Patient called asking to speak to [facility] said she dropped off paper work in the morning with no other information given. Just asked for a call back at Ph# [phone]

## 2018-03-08 NOTE — Telephone Encounter (Signed)
I returned patient call. Patient is coming into office tomorrow morning to bring forms.

## 2018-03-09 ENCOUNTER — Telehealth: Payer: Self-pay | Admitting: Family Medicine

## 2018-03-09 NOTE — Telephone Encounter (Signed)
Patient came into office and brought in forms that need to be signed for DM footwear. Patient is requesting forms be signed and faxed to [phone]. Once forms have been faxed please mail original to patient. Please call patient at [phone] once forms have been faxed and mailed to patient. Forms are in Dr. Vivia Ewing file in the front office.

## 2018-03-09 NOTE — Telephone Encounter (Signed)
Form completed and given to [facility] to fax

## 2018-03-10 HISTORY — PX: BREAST BIOPSY: SHX20

## 2018-03-15 NOTE — Telephone Encounter (Signed)
Form faxed/confirmation received/put in scanning area on 1.31.20/thx dmf

## 2018-03-27 ENCOUNTER — Encounter: Payer: Self-pay | Admitting: Nurse Practitioner

## 2018-03-27 ENCOUNTER — Ambulatory Visit (INDEPENDENT_AMBULATORY_CARE_PROVIDER_SITE_OTHER): Payer: PPO | Admitting: Nurse Practitioner

## 2018-03-27 ENCOUNTER — Other Ambulatory Visit: Payer: Self-pay | Admitting: Nurse Practitioner

## 2018-03-27 VITALS — BP 128/76 | HR 54 | Temp 98.4°F | Ht 68.0 in | Wt 357.8 lb

## 2018-03-27 DIAGNOSIS — N644 Mastodynia: Secondary | ICD-10-CM

## 2018-03-27 DIAGNOSIS — N6452 Nipple discharge: Secondary | ICD-10-CM

## 2018-03-27 NOTE — Progress Notes (Signed)
Subjective:  Patient ID: Stacey Page, female    DOB: [birth year]  Age: 68 y.o. MRN: [id number]  CC: Breast Discharge (pt is c/o of right breast leaking clear fluid/going on 2 wks/ denied other symptoms/MM in 11/2017 was normal. )  HPI  Stacey Page presents with left breast tenderness and nipple discharge x 2weeks, denies any fever or redness or rash. Denies any chest wall injury. No FHx of breast cancer.  Reviewed past Medical, Social and Family history today.  Outpatient Medications Prior to Visit  Medication Sig Dispense Refill  . albuterol (PROAIR HFA) 108 (90 Base) MCG/ACT inhaler Inhale 2 puffs into the lungs every 6 (six) hours as needed for wheezing or shortness of breath. 1 Inhaler 3  . aspirin 81 MG tablet Take 1 tablet (81 mg total) by mouth daily with breakfast. 90 tablet 0  . atorvastatin (LIPITOR) 20 MG tablet Take 1 tablet (20 mg total) by mouth daily at 6 PM. 90 tablet 3  . Calcium Carbonate-Vitamin D (CALCIUM 600+D) 600-400 MG-UNIT tablet Take 1 tablet by mouth daily.    . cholecalciferol (VITAMIN D) 1000 units tablet Take 1,000 Units by mouth daily.    . ciprofloxacin (CIPRO) 250 MG tablet Take 1 tablet (250 mg total) by mouth 2 (two) times daily. 6 tablet 0  . Clobetasol Prop Emollient Base (CLOBETASOL PROPIONATE E) 0.05 % emollient cream Apply 1 application topically daily as needed (skin irritations). 30 g 2  . ferrous sulfate (FEROSUL) 325 (65 FE) MG tablet Take 325 mg by mouth daily with breakfast.    . furosemide (LASIX) 40 MG tablet Take 40 mg daily by mouth , may take an additional 40 mg at 2 pm if increase swelling and /or shortness of breathe 180 tablet 3  . lisinopril (PRINIVIL,ZESTRIL) 20 MG tablet Take 1 tablet (20 mg total) by mouth daily. 90 tablet 1  . mometasone (NASONEX) 50 MCG/ACT nasal spray Place 2 sprays into the nose daily. 17 g 11  . montelukast (SINGULAIR) 10 MG tablet Take 1 tablet (10 mg total) by mouth at bedtime. 90 tablet 3  . sitaGLIPtin  (JANUVIA) 100 MG tablet Take 1 tablet (100 mg total) by mouth daily. 90 tablet 3  . carvedilol (COREG) 12.5 MG tablet Take 1.5 tablets (18.75 mg total) by mouth 2 (two) times daily. 270 tablet 3   No facility-administered medications prior to visit.     ROS See HPI  Objective:  BP 128/76   Pulse (!) 54   Temp 98.4 F (36.9 C) (Oral)   Ht 5\' 8"  (1.727 m)   Wt (!) 357 lb 12.8 oz (162.3 kg)   BMI 54.40 kg/m   BP Readings from Last 3 Encounters:  03/27/18 128/76  03/08/18 132/80  03/05/18 (!) 154/66    Wt Readings from Last 3 Encounters:  03/27/18 (!) 357 lb 12.8 oz (162.3 kg)  03/08/18 (!) 359 lb 9.6 oz (163.1 kg)  03/05/18 (!) 357 lb 3.2 oz (162 kg)    Physical Exam Vitals signs reviewed. Exam conducted with a chaperone present.  Chest:     Chest wall: Tenderness present.     Breasts:        Right: Nipple discharge, skin change and tenderness present. No swelling, bleeding, inverted nipple or mass.        Left: Normal.  Lymphadenopathy:     Upper Body:     Right upper body: No supraclavicular, axillary or pectoral adenopathy.  Skin:    Findings: No erythema.  Neurological:     Mental Status: She is alert.     Lab Results  Component Value Date   WBC 5.6 01/16/2018   HGB 13.0 01/16/2018   HCT 40.6 01/16/2018   PLT 235.0 01/16/2018   GLUCOSE 101 (H) 01/16/2018   CHOL 132 01/16/2018   TRIG 81.0 01/16/2018   HDL 55.60 01/16/2018   LDLCALC 60 01/16/2018   ALT 11 01/16/2018   AST 14 01/16/2018   NA 141 01/16/2018   K 4.9 01/16/2018   CL 102 01/16/2018   CREATININE 1.34 (H) 01/16/2018   BUN 19 01/16/2018   CO2 31 01/16/2018   TSH 0.81 01/19/2017   INR 1.11 06/16/2014   HGBA1C 6.2 01/16/2018   MICROALBUR 1.9 01/16/2018    Mm 3d Screen Breast Bilateral  Result Date: 12/05/2017 CLINICAL DATA:  Screening. EXAM: DIGITAL SCREENING BILATERAL MAMMOGRAM WITH TOMO AND CAD COMPARISON:  Previous exam(s). ACR Breast Density Category b: There are scattered areas of  fibroglandular density. FINDINGS: There are no findings suspicious for malignancy. Images were processed with CAD. IMPRESSION: No mammographic evidence of malignancy. A result letter of this screening mammogram will be mailed directly to the patient. RECOMMENDATION: Screening mammogram in one year. (Code:SM-B-01Y) BI-RADS CATEGORY  1: Negative. Electronically Signed   By: Dorise Bullion III M.D   On: 12/05/2017 12:03    Assessment & Plan:   Rumor was seen today for breast discharge.  Diagnoses and all orders for this visit:  Discharge from right nipple -     Cancel: US BREAST COMPLETE UNI LEFT INC AXILLA; Future -     Cancel: MM Digital Diagnostic Unilat L; Future -     Cancel: MM Digital Diagnostic Bilat; Future -     Cancel: MM DIAG BREAST TOMO UNI LEFT; Future -     Cancel: US BREAST LTD UNI LEFT INC AXILLA; Future -     MM Digital Diagnostic Bilat; Future -     MM Digital Diagnostic Unilat R; Future -     US BREAST COMPLETE UNI RIGHT INC AXILLA; Future  Breast pain, right -     MM Digital Diagnostic Bilat; Future -     MM Digital Diagnostic Unilat R; Future -     US BREAST COMPLETE UNI RIGHT INC AXILLA; Future   I am having Stacey Page maintain her Clobetasol Prop Emollient Base, albuterol, aspirin EC, mometasone, Calcium Carbonate-Vitamin D, ferrous sulfate, cholecalciferol, carvedilol, furosemide, atorvastatin, lisinopril, montelukast, sitaGLIPtin, and ciprofloxacin.  No orders of the defined types were placed in this encounter.   Problem List Items Addressed This Visit    None    Visit Diagnoses    Discharge from right nipple    -  Primary   Relevant Orders   MM Digital Diagnostic Bilat   MM Digital Diagnostic Unilat R   US BREAST COMPLETE UNI RIGHT INC AXILLA   Breast pain, right       Relevant Orders   MM Digital Diagnostic Bilat   MM Digital Diagnostic Unilat R   US BREAST COMPLETE UNI RIGHT INC AXILLA       Follow-up: No follow-ups on  file.  Wilfred Lacy, NP

## 2018-03-27 NOTE — Patient Instructions (Signed)
You will be contacted to schedule appt for breast US and diagnostic mammogram.

## 2018-03-28 ENCOUNTER — Ambulatory Visit
Admission: RE | Admit: 2018-03-28 | Discharge: 2018-03-28 | Disposition: A | Discharge status: Home / Self Care | Payer: PPO | Source: Ambulatory Visit | Attending: Nurse Practitioner | Admitting: Nurse Practitioner

## 2018-03-28 ENCOUNTER — Other Ambulatory Visit: Payer: Self-pay | Admitting: Nurse Practitioner

## 2018-03-28 ENCOUNTER — Telehealth: Payer: Self-pay | Admitting: Nurse Practitioner

## 2018-03-28 DIAGNOSIS — R928 Other abnormal and inconclusive findings on diagnostic imaging of breast: Secondary | ICD-10-CM | POA: Diagnosis not present

## 2018-03-28 DIAGNOSIS — N6452 Nipple discharge: Secondary | ICD-10-CM

## 2018-03-28 DIAGNOSIS — N6310 Unspecified lump in the right breast, unspecified quadrant: Secondary | ICD-10-CM

## 2018-03-28 DIAGNOSIS — N6312 Unspecified lump in the right breast, upper inner quadrant: Secondary | ICD-10-CM | POA: Diagnosis not present

## 2018-03-28 NOTE — Telephone Encounter (Signed)
Copied from [place] [id number]. Topic: Quick Communication - See Telephone Encounter >> Mar 28, 2018 10:56 AM Rayann Heman wrote: CRM for notification. See Telephone encounter for: 03/28/18. Pt called and stated that she would like Chaelotte [name] to call this number [phone] regarding medications. Pt would not give very much detail just that someone needed to call number so that she can get medications. januvia Please advise

## 2018-03-28 NOTE — Telephone Encounter (Signed)
Routed to wrong practice. Sending to [employer].

## 2018-03-29 NOTE — Telephone Encounter (Signed)
Spoke with pharmacy, gave information they requested.

## 2018-03-30 DIAGNOSIS — R0683 Snoring: Secondary | ICD-10-CM | POA: Diagnosis not present

## 2018-03-30 DIAGNOSIS — G4733 Obstructive sleep apnea (adult) (pediatric): Secondary | ICD-10-CM | POA: Diagnosis not present

## 2018-03-30 DIAGNOSIS — G4700 Insomnia, unspecified: Secondary | ICD-10-CM | POA: Diagnosis not present

## 2018-04-02 ENCOUNTER — Ambulatory Visit
Admission: RE | Admit: 2018-04-02 | Discharge: 2018-04-02 | Disposition: A | Discharge status: Home / Self Care | Payer: PPO | Source: Ambulatory Visit | Attending: Nurse Practitioner | Admitting: Nurse Practitioner

## 2018-04-02 DIAGNOSIS — D241 Benign neoplasm of right breast: Secondary | ICD-10-CM | POA: Diagnosis not present

## 2018-04-02 DIAGNOSIS — N6452 Nipple discharge: Secondary | ICD-10-CM

## 2018-04-02 DIAGNOSIS — N6310 Unspecified lump in the right breast, unspecified quadrant: Secondary | ICD-10-CM

## 2018-04-02 DIAGNOSIS — N6312 Unspecified lump in the right breast, upper inner quadrant: Secondary | ICD-10-CM | POA: Diagnosis not present

## 2018-04-06 ENCOUNTER — Telehealth: Payer: Self-pay | Admitting: Podiatry

## 2018-04-06 NOTE — Telephone Encounter (Signed)
t left message checking on status of her diabetic shoes.  I returned call and the shoes are on back order until 3.25.2020.

## 2018-04-07 ENCOUNTER — Other Ambulatory Visit: Payer: Self-pay | Admitting: Cardiology

## 2018-04-09 NOTE — Telephone Encounter (Signed)
Rx(s) sent to pharmacy electronically.  

## 2018-04-10 ENCOUNTER — Ambulatory Visit: Payer: Self-pay | Admitting: General Surgery

## 2018-04-10 ENCOUNTER — Other Ambulatory Visit: Payer: Self-pay | Admitting: General Surgery

## 2018-04-10 DIAGNOSIS — D241 Benign neoplasm of right breast: Secondary | ICD-10-CM | POA: Diagnosis not present

## 2018-05-17 ENCOUNTER — Inpatient Hospital Stay (HOSPITAL_COMMUNITY): Admission: RE | Admit: 2018-05-17 | Payer: Self-pay | Source: Ambulatory Visit

## 2018-05-24 ENCOUNTER — Ambulatory Visit (HOSPITAL_COMMUNITY): Admission: RE | Admit: 2018-05-24 | Payer: PPO | Source: Home / Self Care | Admitting: General Surgery

## 2018-05-24 ENCOUNTER — Encounter (HOSPITAL_COMMUNITY): Admission: RE | Payer: Self-pay | Source: Home / Self Care

## 2018-05-24 SURGERY — BREAST LUMPECTOMY WITH RADIOACTIVE SEED LOCALIZATION
Anesthesia: General | Site: Breast | Laterality: Right

## 2018-06-04 ENCOUNTER — Ambulatory Visit: Payer: PPO | Admitting: Orthotics

## 2018-06-04 ENCOUNTER — Encounter

## 2018-06-04 ENCOUNTER — Other Ambulatory Visit: Payer: Self-pay

## 2018-06-05 NOTE — Progress Notes (Deleted)
{Choose 1 Note Type (Telehealth Visit or Telephone Visit):[phone]}  Evaluation Performed:  Follow-up visit  This visit type was conducted due to national recommendations for restrictions regarding the COVID-19 Pandemic (e.g. social distancing).  This format is felt to be most appropriate for this patient at this time.  All issues noted in this document were discussed and addressed.  No physical exam was performed (except for noted visual exam findings with Video Visits).  Please refer to the patient's chart (MyChart message for video visits and phone note for telephone visits) for the patient's consent to telehealth for [facility].  Date:  06/05/2018   ID:  Stacey Page, DOB 10-16-1950, MRN [id number]  Patient Location:  ***  Provider location:   ***  PCP:  Flossie Buffy, NP  Cardiologist:  Glenetta Hew, MD 10/25/2017 Electrophysiologist:  None   Chief Complaint:  Follow up  History of Present Illness:    Stacey Page is a 68 y.o. female who presents via audio/video conferencing for a telehealth visit today.    68 y.o. yo female who has a hx of Morbid Obesity, HFpEF (HTN Heart Dz), hypertension, hyperlipidemia and diabetes along with OSA on CPAP as well as asthma.    Her weight has been 353-359 lbs since 08/2017. Cardiac CTA w/ Ca++ score 11, no obstructive CAD. At last office visit, feet hurt w/ ambulation>>podiatrist and dx w/ plantar fasciitis>>better w/ rx  ***   The patient {does/does not:200015} have symptoms concerning for COVID-19 infection (fever, chills, cough, or new shortness of breath).    Prior CV studies:   The following studies were reviewed today:  CARDIAC CTA: 10/06/2017 FINDINGS: Non-cardiac: See separate report from [facility]iology.  The pulmonary veins drained normally to the right atrium.  Calcium Score: 11 Agatston units  Coronary Arteries: Right dominant with no anomalies  LM: Calcified plaque, no significant  stenosis.  LAD system: No plaque or stenosis.  Circumflex system: No plaque or stenosis.  RCA system: No plaque or stenosis.  IMPRESSION: 1. Coronary artery calcium score 11 Agatston units, this places the patient in the 60th percentile for age and gender, suggesting intermediate risk for future cardiac events.  2.  No obstructive CAD.   ECHO: 03/31/2017 - Study data: M-mode, complete 2D, spectral Doppler, and color   Doppler. - Left ventricle: The cavity size was normal. There was moderate   concentric hypertrophy. Systolic function was normal. The   estimated ejection fraction was in the range of 60% to 65%. Wall   motion was normal; there were no regional wall motion   abnormalities. Features are consistent with a pseudonormal left   ventricular filling pattern, with concomitant abnormal relaxation   and increased filling pressure (grade 2 diastolic dysfunction).   Doppler parameters are consistent with high ventricular filling   pressure. - Aortic valve: There was mild stenosis. Peak velocity (S): 265   cm/s. Mean gradient (S): 15 mm Hg. Peak gradient (S): 28 mm Hg.   Valve area (VTI): 2.27 cm^2. Valve area (Vmax): 1.81 cm^2. Valve   area (Vmean): 1.86 cm^2. - Mitral valve: Mildly calcified annulus. Transvalvular velocity   was within the normal range. There was no evidence for stenosis.   There was no regurgitation. - Right ventricle: The cavity size was normal. Wall thickness was   normal. Systolic function was normal. - Atrial septum: No defect or patent foramen ovale was identified. - Tricuspid valve: There was mild regurgitation. - Pulmonary arteries: Systolic pressure was within  the normal   range. PA peak pressure: 25 mm Hg (S). - Global longitudinal strain -21.2% (normal).   Past Medical History:  Diagnosis Date  . Anxiety    doesn't take any meds  . Arthritis of right knee 03/14/2016  . Asthma   . Chronic diastolic CHF (congestive heart failure)  (HCC)    HF with Preserved EF (60-65%) - Grade II Diastolic Dysfunction (Hypertensive Heart Disease). takes Furosemide daily  . COPD (chronic obstructive pulmonary disease) (HCC)    Albuterol as needed  . Depression    doesn't take meds  . Diabetes ([place])    takes Januvia daily  . Eczema    uses cream as needed  . GERD (gastroesophageal reflux disease)   . History of bronchitis as a child   . HTN (hypertension)   . Insomnia   . Mild aortic stenosis by prior echocardiogram 04/2017   Mild stenosis: Mean gradient 15 mmHg, peak gradient 28 mmHg  . OA (osteoarthritis)   . OSA (obstructive sleep apnea) 02/25/2014   wears CPAP at night  . Peripheral neuropathy    takes Gabapentin as needed  . Pneumonia    hx of-2010  . Seasonal allergies    uses Flonase daily   Past Surgical History:  Procedure Laterality Date  . CESAREAN SECTION  x2  . COLONOSCOPY    . CORONARY CALCIUM SCORE & CT ANGIOGRAM  09/2017   Coronary Ca Score = 11 (low).  CTA- no obstructive CAD (minimal disease)  . JOINT REPLACEMENT    . KNEE ARTHROSCOPY Right   . LUNG BIOPSY Right 06/10/2014   Procedure: LUNG BIOPSY;  Surgeon: Ivin Poot, MD;  Location: [place];  Service: Thoracic;  Laterality: Right;  . POLYPECTOMY     throat  . TOTAL KNEE ARTHROPLASTY Right 03/14/2016   Procedure: RIGHT TOTAL KNEE ARTHROPLASTY;  Surgeon: Marybelle Killings, MD;  Location: [place];  Service: Orthopedics;  Laterality: Right;  . TRANSTHORACIC ECHOCARDIOGRAM  03/2017    EF 60-65% moderate concentric LVH GRII DD.  Mild aortic stenosis (mean gradient 15 mmHg, peak gradient 28 mmHg).  . VIDEO ASSISTED THORACOSCOPY Right 06/10/2014   Procedure: VIDEO ASSISTED THORACOSCOPY;  Surgeon: Ivin Poot, MD;  Location: [place];  Service: Thoracic;  Laterality: Right;     No outpatient medications have been marked as taking for the 06/08/18 encounter (Appointment) with Tamirah George, Evelene Croon, PA-C.     Allergies:   Patient has no known allergies.   Social  History   Tobacco Use  . Smoking status: Former Smoker    Packs/day: 0.25    Years: 15.00    Pack years: 3.75    Types: Cigarettes    Last attempt to quit: 02/08/1980    Years since quitting: 38.3  . Smokeless tobacco: Former Systems developer  . Tobacco comment: 2 packs per week  Substance Use Topics  . Alcohol use: Yes    Alcohol/week: 0.0 standard drinks    Comment: occassional/social/rare  . Drug use: No     Family Hx: The patient's family history includes Anemia in her father; COPD in her father; Cancer (age of onset: 9) in her mother; Hypothyroidism in her father.  ROS:   Please see the history of present illness.    All other systems reviewed and are negative.   Labs/Other Tests and Data Reviewed:    Recent Labs: 01/16/2018: ALT 11; BUN 19; Creatinine, Ser 1.34; Hemoglobin 13.0; Platelets 235.0; Potassium 4.9; Sodium 141   Recent Lipid  Panel Lab Results  Component Value Date/Time   CHOL 132 01/16/2018 11:17 AM   TRIG 81.0 01/16/2018 11:17 AM   HDL 55.60 01/16/2018 11:17 AM   CHOLHDL 2 01/16/2018 11:17 AM   LDLCALC 60 01/16/2018 11:17 AM    Wt Readings from Last 3 Encounters:  03/27/18 (!) 357 lb 12.8 oz (162.3 kg)  03/08/18 (!) 359 lb 9.6 oz (163.1 kg)  03/05/18 (!) 357 lb 3.2 oz (162 kg)     Objective:    Vital Signs:  There were no vitals taken for this visit.   Well nourished, well developed female in no*** acute distress. ***  ASSESSMENT & PLAN:    1.  ***  COVID-19 Education: The signs and symptoms of COVID-19 were discussed with the patient and how to seek care for testing (follow up with PCP or arrange E-visit).  The importance of social distancing was discussed today.  Patient Risk:   After full review of this patient's clinical status, I feel that they are at least moderate risk at this time.  Time:   Today, I have spent *** minutes with the patient with telehealth technology discussing ***.     Medication Adjustments/Labs and Tests Ordered:  Current medicines are reviewed at length with the patient today.  Concerns regarding medicines are outlined above.  Tests Ordered: No orders of the defined types were placed in this encounter.  Medication Changes: No orders of the defined types were placed in this encounter.   Disposition:  Follow up {follow up:15908}  Signed, Rosaria Ferries, PA-C  06/05/2018 7:38 PM    [facility]

## 2018-06-07 ENCOUNTER — Telehealth: Payer: Self-pay | Admitting: Physician Assistant

## 2018-06-07 NOTE — Telephone Encounter (Signed)
Follow up:   Patient would like to cancel her up coming appt tomorrow. Please call patient if she need to keep this appt.

## 2018-06-07 NOTE — Telephone Encounter (Signed)
Called patient back about her message. Patient states she is doing fine and she would like to move her appointment out until we are seeing people in the office again. Made patient an appointment in September with Dr. Ellyn Hack for 6 month f/u. Patient will call our office if she had any issues or concerns that come up in the mean time.

## 2018-06-08 ENCOUNTER — Telehealth: Payer: PPO | Admitting: Physician Assistant

## 2018-06-14 ENCOUNTER — Other Ambulatory Visit: Payer: Self-pay

## 2018-06-14 ENCOUNTER — Ambulatory Visit (INDEPENDENT_AMBULATORY_CARE_PROVIDER_SITE_OTHER): Payer: PPO | Admitting: Orthotics

## 2018-06-14 ENCOUNTER — Encounter

## 2018-06-14 DIAGNOSIS — M722 Plantar fascial fibromatosis: Secondary | ICD-10-CM

## 2018-06-14 DIAGNOSIS — M79675 Pain in left toe(s): Secondary | ICD-10-CM

## 2018-06-14 DIAGNOSIS — M2141 Flat foot [pes planus] (acquired), right foot: Secondary | ICD-10-CM

## 2018-06-14 DIAGNOSIS — M2142 Flat foot [pes planus] (acquired), left foot: Secondary | ICD-10-CM

## 2018-06-14 DIAGNOSIS — M2041 Other hammer toe(s) (acquired), right foot: Secondary | ICD-10-CM

## 2018-06-14 DIAGNOSIS — E0822 Diabetes mellitus due to underlying condition with diabetic chronic kidney disease: Secondary | ICD-10-CM

## 2018-06-14 DIAGNOSIS — Z992 Dependence on renal dialysis: Secondary | ICD-10-CM

## 2018-06-14 DIAGNOSIS — E1151 Type 2 diabetes mellitus with diabetic peripheral angiopathy without gangrene: Secondary | ICD-10-CM | POA: Diagnosis not present

## 2018-06-14 DIAGNOSIS — M79674 Pain in right toe(s): Secondary | ICD-10-CM

## 2018-06-14 DIAGNOSIS — M2042 Other hammer toe(s) (acquired), left foot: Secondary | ICD-10-CM | POA: Diagnosis not present

## 2018-06-14 DIAGNOSIS — B351 Tinea unguium: Secondary | ICD-10-CM

## 2018-06-14 DIAGNOSIS — N186 End stage renal disease: Secondary | ICD-10-CM

## 2018-06-14 DIAGNOSIS — Z794 Long term (current) use of insulin: Secondary | ICD-10-CM

## 2018-06-14 NOTE — Progress Notes (Signed)
Patient had appointment today for definitive and final diabetic shoe fitting and delivery.  Patient was seen by Aime Meloche C.Ped, O[facility].   The inserts fit well and accomplished full contact with the plantar surface of the foot bilateral; the shoes fit well and offered forefoot freedom, no noticible heel slippage, and good toe clearance w/ the insert in place.  Patient was advised to monitor of any skin irritation, breakdown.  Patient was satisfied with fit and function.

## 2018-07-03 NOTE — Progress Notes (Signed)
Virtual Visit via Video Note  I connected with patient on 07/04/18 at  8:00 AM EDT by audio only enabled telemedicine application and verified that I am speaking with the correct person using two identifiers.   THIS ENCOUNTER IS A VIRTUAL VISIT DUE TO COVID-19 - PATIENT WAS NOT SEEN IN THE OFFICE. PATIENT HAS CONSENTED TO VIRTUAL VISIT / TELEMEDICINE VISIT   Location of patient: home  Location of provider: office  I discussed the limitations of evaluation and management by telemedicine and the availability of in person appointments. The patient expressed understanding and agreed to proceed.   Subjective:   Stacey Page is a 68 y.o. female who presents for Medicare Annual (Subsequent) preventive examination.  Review of Systems: No ROS.  Medicare Wellness Virtual Visit.  Visual/audio telehealth visit, UTA vital signs.   See social history for additional risk factors. Cardiac Risk Factors include: advanced age (>58men, >98 women);diabetes mellitus;dyslipidemia;hypertension;sedentary lifestyle;obesity (BMI >30kg/m2) Sleep patterns: no issues Home Safety/Smoke Alarms: Feels safe in home. Smoke alarms in place.  Lives [place] story ranch with daughter and grandson.   Female:   Pap-  declines     Mammo-  Scheduled 07/20/18     Dexa scan- 01/24/17       CCS- Cologuard 09/01/17. Negative      Objective:     Advanced Directives 07/04/2018 06/28/2017 03/21/2017 04/04/2016 03/16/2016 03/07/2016 10/07/2015  Does Patient Have a Medical Advance Directive? No No No No No No No  Would patient like information on creating a medical advance directive? No - Patient declined - No - Patient declined Yes (MAU/Ambulatory/Procedural Areas - Information given) No - Patient declined No - Patient declined No - patient declined information    Tobacco Social History   Tobacco Use  Smoking Status Former Smoker  . Packs/day: 0.25  . Years: 15.00  . Pack years: 3.75  . Types: Cigarettes  . Last attempt to  quit: 02/08/1980  . Years since quitting: 38.4  Smokeless Tobacco Former Systems developer  Tobacco Comment   2 packs per week     Counseling given: Not Answered Comment: 2 packs per week   Clinical Intake:     Pain : No/denies pain                 Past Medical History:  Diagnosis Date  . Anxiety    doesn't take any meds  . Arthritis of right knee 03/14/2016  . Asthma   . Chronic diastolic CHF (congestive heart failure) (HCC)    HF with Preserved EF (60-65%) - Grade II Diastolic Dysfunction (Hypertensive Heart Disease). takes Furosemide daily  . COPD (chronic obstructive pulmonary disease) (HCC)    Albuterol as needed  . Depression    doesn't take meds  . Diabetes ([place])    takes Januvia daily  . Eczema    uses cream as needed  . GERD (gastroesophageal reflux disease)   . History of bronchitis as a child   . HTN (hypertension)   . Insomnia   . Mild aortic stenosis by prior echocardiogram 04/2017   Mild stenosis: Mean gradient 15 mmHg, peak gradient 28 mmHg  . OA (osteoarthritis)   . OSA (obstructive sleep apnea) 02/25/2014   wears CPAP at night  . Peripheral neuropathy    takes Gabapentin as needed  . Pneumonia    hx of-2010  . Seasonal allergies    uses Flonase daily   Past Surgical History:  Procedure Laterality Date  . CESAREAN SECTION  x2  .  COLONOSCOPY    . CORONARY CALCIUM SCORE & CT ANGIOGRAM  09/2017   Coronary Ca Score = 11 (low).  CTA- no obstructive CAD (minimal disease)  . JOINT REPLACEMENT    . KNEE ARTHROSCOPY Right   . LUNG BIOPSY Right 06/10/2014   Procedure: LUNG BIOPSY;  Surgeon: Ivin Poot, MD;  Location: [place];  Service: Thoracic;  Laterality: Right;  . POLYPECTOMY     throat  . TOTAL KNEE ARTHROPLASTY Right 03/14/2016   Procedure: RIGHT TOTAL KNEE ARTHROPLASTY;  Surgeon: Marybelle Killings, MD;  Location: [place];  Service: Orthopedics;  Laterality: Right;  . TRANSTHORACIC ECHOCARDIOGRAM  03/2017    EF 60-65% moderate concentric LVH GRII DD.  Mild  aortic stenosis (mean gradient 15 mmHg, peak gradient 28 mmHg).  . VIDEO ASSISTED THORACOSCOPY Right 06/10/2014   Procedure: VIDEO ASSISTED THORACOSCOPY;  Surgeon: Ivin Poot, MD;  Location: [facility];  Service: Thoracic;  Laterality: Right;   Family History  Problem Relation Age of Onset  . COPD Father   . Hypothyroidism Father   . Anemia Father        iron deficiency  . Cancer Mother 61       pancreatic   Social History   Socioeconomic History  . Marital status: Widowed    Spouse name: Not on file  . Number of children: Not on file  . Years of education: Not on file  . Highest education level: Not on file  Occupational History  . Occupation: disabled  Social Needs  . Financial resource strain: Not on file  . Food insecurity:    Worry: Not on file    Inability: Not on file  . Transportation needs:    Medical: Not on file    Non-medical: Not on file  Tobacco Use  . Smoking status: Former Smoker    Packs/day: 0.25    Years: 15.00    Pack years: 3.75    Types: Cigarettes    Last attempt to quit: 02/08/1980    Years since quitting: 38.4  . Smokeless tobacco: Former Systems developer  . Tobacco comment: 2 packs per week  Substance and Sexual Activity  . Alcohol use: Yes    Alcohol/week: 0.0 standard drinks    Comment: occassional/social/rare  . Drug use: No  . Sexual activity: Not Currently  Lifestyle  . Physical activity:    Days per week: Not on file    Minutes per session: Not on file  . Stress: Not on file  Relationships  . Social connections:    Talks on phone: Not on file    Gets together: Not on file    Attends religious service: Not on file    Active member of club or organization: Not on file    Attends meetings of clubs or organizations: Not on file    Relationship status: Not on file  Other Topics Concern  . Not on file  Social History Narrative  . Not on file    Outpatient Encounter Medications as of 07/04/2018  Medication Sig  . aspirin 81 MG tablet Take 1  tablet (81 mg total) by mouth daily with breakfast.  . atorvastatin (LIPITOR) 20 MG tablet Take 1 tablet (20 mg total) by mouth daily at 6 PM.  . Calcium Carbonate-Vitamin D (CALCIUM 600+D) 600-400 MG-UNIT tablet Take 1 tablet by mouth daily.  . carvedilol (COREG) 12.5 MG tablet Take 1.5 tablets (18.75 mg total) by mouth 2 (two) times daily with a meal.  .  cholecalciferol (VITAMIN D) 1000 units tablet Take 1,000 Units by mouth daily.  . Clobetasol Prop Emollient Base (CLOBETASOL PROPIONATE E) 0.05 % emollient cream Apply 1 application topically daily as needed (skin irritations).  . ferrous sulfate (FEROSUL) 325 (65 FE) MG tablet Take 325 mg by mouth daily with breakfast.  . furosemide (LASIX) 40 MG tablet Take 40 mg daily by mouth , may take an additional 40 mg at 2 pm if increase swelling and /or shortness of breathe (Patient taking differently: Take 40 mg by mouth 2 (two) times daily as needed for edema (shortness of breath). Take 40 mg daily by mouth , may take an additional 40 mg at 2 pm if increase swelling and /or shortness of breathe)  . lisinopril (PRINIVIL,ZESTRIL) 20 MG tablet Take 1 tablet (20 mg total) by mouth daily.  . mometasone (NASONEX) 50 MCG/ACT nasal spray Place 2 sprays into the nose daily.  . montelukast (SINGULAIR) 10 MG tablet Take 1 tablet (10 mg total) by mouth at bedtime.  . sitaGLIPtin (JANUVIA) 100 MG tablet Take 1 tablet (100 mg total) by mouth daily.  . vitamin C (ASCORBIC ACID) 500 MG tablet Take 500 mg by mouth daily.   Marland Kitchen albuterol (PROAIR HFA) 108 (90 Base) MCG/ACT inhaler Inhale 2 puffs into the lungs every 6 (six) hours as needed for wheezing or shortness of breath. (Patient not taking: Reported on 07/04/2018)  . ciprofloxacin (CIPRO) 250 MG tablet Take 1 tablet (250 mg total) by mouth 2 (two) times daily. (Patient not taking: Reported on 05/03/2018)   No facility-administered encounter medications on file as of 07/04/2018.     Activities of Daily Living In your  present state of health, do you have any difficulty performing the following activities: 07/04/2018  Hearing? N  Vision? N  Difficulty concentrating or making decisions? N  Walking or climbing stairs? N  Dressing or bathing? N  Doing errands, shopping? N  Preparing Food and eating ? N  Using the Toilet? N  In the past six months, have you accidently leaked urine? N  Do you have problems with loss of bowel control? N  Managing your Medications? N  Managing your Finances? N  Housekeeping or managing your Housekeeping? N  Some recent data might be hidden    Patient Care Team: [name], [name], NP as PCP - General (Internal Medicine) Leonie Man, MD as PCP - Cardiology (Cardiology)    Assessment:   This is a routine wellness examination for Stacey Page. Physical assessment deferred to PCP.  Exercise Activities and Dietary recommendations Current Exercise Habits: The patient does not participate in regular exercise at present, Exercise limited by: None identified Diet (meal preparation, eat out, water intake, caffeinated beverages, dairy products, fruits and vegetables): in general, an "unhealthy" diet. Healthy nutrition habit discussed.   Goals    . Increase physical activity     Begin silver sneakers at least 2x/ week. Walk to mailbox everyday.       Fall Risk Fall Risk  07/04/2018 03/27/2018 06/28/2017 03/21/2017 11/18/2015  Falls in the past year? 0 0 No No No  Risk for fall due to : - - - - -  Risk for fall due to: Comment - - - - -    Depression Screen PHQ 2/9 Scores 07/04/2018 03/27/2018 06/28/2017 03/21/2017  PHQ - 2 Score 0 0 1 1  PHQ- 9 Score - - - -     Cognitive Function Ad8 score reviewed for issues:  Issues making decisions:no  Less interest in hobbies / activities:no  Repeats questions, stories (family complaining):no  Trouble using ordinary gadgets (microwave, computer, phone):no  Forgets the month or year: no  Mismanaging finances: no   Remembering appts:no  Daily problems with thinking and/or memory:no Ad8 score is=0        Immunization History  Administered Date(s) Administered  . Influenza Split 11/21/2016  . Influenza,inj,Quad PF,6+ Mos 01/12/2015, 11/19/2015  . Pneumococcal Conjugate-13 01/19/2017  . Pneumococcal Polysaccharide-23 01/12/2015    Screening Tests Health Maintenance  Topic Date Due  . OPHTHALMOLOGY EXAM  02/07/2018  . TETANUS/TDAP  01/17/2019 (Originally 02/07/2017)  . HEMOGLOBIN A1C  07/18/2018  . INFLUENZA VACCINE  09/08/2018  . FOOT EXAM  01/17/2019  . MAMMOGRAM  12/05/2019  . PNA vac Low Risk Adult (2 of 2 - PPSV23) 01/12/2020  . Fecal DNA (Cologuard)  09/01/2020  . DEXA SCAN  Completed  . Hepatitis C Screening  Completed      Plan:    Please schedule your next medicare wellness visit with me in 1 yr.  Eat heart healthy diet (full of fruits, vegetables, whole grains, lean protein, water--limit salt, fat, and sugar intake) and increase physical activity as tolerated.  Continue doing brain stimulating activities (puzzles, reading, adult coloring books, staying active) to keep memory sharp.     I have personally reviewed and noted the following in the patient's chart:   . Medical and social history . Use of alcohol, tobacco or illicit drugs  . Current medications and supplements . Functional ability and status . Nutritional status . Physical activity . Advanced directives . List of other physicians . Hospitalizations, surgeries, and ER visits in previous 12 months . Vitals . Screenings to include cognitive, depression, and falls . Referrals and appointments  In addition, I have reviewed and discussed with patient certain preventive protocols, quality metrics, and best practice recommendations. A written personalized care plan for preventive services as well as general preventive health recommendations were provided to patient.     Naaman Plummer [place], [place]  07/04/2018

## 2018-07-04 ENCOUNTER — Encounter: Payer: Self-pay | Admitting: *Deleted

## 2018-07-04 ENCOUNTER — Ambulatory Visit (INDEPENDENT_AMBULATORY_CARE_PROVIDER_SITE_OTHER): Payer: PPO | Admitting: *Deleted

## 2018-07-04 DIAGNOSIS — Z0000 Encounter for general adult medical examination without abnormal findings: Secondary | ICD-10-CM | POA: Diagnosis not present

## 2018-07-04 NOTE — Patient Instructions (Signed)
Please schedule your next medicare wellness visit with me in 1 yr.  Eat heart healthy diet (full of fruits, vegetables, whole grains, lean protein, water--limit salt, fat, and sugar intake) and increase physical activity as tolerated.  Continue doing brain stimulating activities (puzzles, reading, adult coloring books, staying active) to keep memory sharp.    Stacey Page , Thank you for taking time to come for your Medicare Wellness Visit. I appreciate your ongoing commitment to your health goals. Please review the following plan we discussed and let me know if I can assist you in the future.   These are the goals we discussed: Goals    . Increase physical activity     Begin silver sneakers at least 2x/ week. Walk to mailbox everyday.       This is a list of the screening recommended for you and due dates:  Health Maintenance  Topic Date Due  . Eye exam for diabetics  02/07/2018  . Tetanus Vaccine  01/17/2019*  . Hemoglobin A1C  07/18/2018  . Flu Shot  09/08/2018  . Complete foot exam   01/17/2019  . Mammogram  12/05/2019  . Pneumonia vaccines (2 of 2 - PPSV23) 01/12/2020  . Cologuard (Stool DNA test)  09/01/2020  . DEXA scan (bone density measurement)  Completed  .  Hepatitis C: One time screening is recommended by Center for Disease Control  (CDC) for  adults born from [birth year] through 1964.   Completed  *Topic was postponed. The date shown is not the original due date.    Health Maintenance After Age 92 After age 33, you are at a higher risk for certain long-term diseases and infections as well as injuries from falls. Falls are a major cause of broken bones and head injuries in people who are older than age 21. Getting regular preventive care can help to keep you healthy and well. Preventive care includes getting regular testing and making lifestyle changes as recommended by your health care provider. Talk with your health care provider about:  Which screenings and tests you  should have. A screening is a test that checks for a disease when you have no symptoms.  A diet and exercise plan that is right for you. What should I know about screenings and tests to prevent falls? Screening and testing are the best ways to find a health problem early. Early diagnosis and treatment give you the best chance of managing medical conditions that are common after age 78. Certain conditions and lifestyle choices may make you more likely to have a fall. Your health care provider may recommend:  Regular vision checks. Poor vision and conditions such as cataracts can make you more likely to have a fall. If you wear glasses, make sure to get your prescription updated if your vision changes.  Medicine review. Work with your health care provider to regularly review all of the medicines you are taking, including over-the-counter medicines. Ask your health care provider about any side effects that may make you more likely to have a fall. Tell your health care provider if any medicines that you take make you feel dizzy or sleepy.  Osteoporosis screening. Osteoporosis is a condition that causes the bones to get weaker. This can make the bones weak and cause them to break more easily.  Blood pressure screening. Blood pressure changes and medicines to control blood pressure can make you feel dizzy.  Strength and balance checks. Your health care provider may recommend certain tests to check your  strength and balance while standing, walking, or changing positions.  Foot health exam. Foot pain and numbness, as well as not wearing proper footwear, can make you more likely to have a fall.  Depression screening. You may be more likely to have a fall if you have a fear of falling, feel emotionally low, or feel unable to do activities that you used to do.  Alcohol use screening. Using too much alcohol can affect your balance and may make you more likely to have a fall. What actions can I take to lower  my risk of falls? General instructions  Talk with your health care provider about your risks for falling. Tell your health care provider if: ? You fall. Be sure to tell your health care provider about all falls, even ones that seem minor. ? You feel dizzy, sleepy, or off-balance.  Take over-the-counter and prescription medicines only as told by your health care provider. These include any supplements.  Eat a healthy diet and maintain a healthy weight. A healthy diet includes low-fat dairy products, low-fat (lean) meats, and fiber from whole grains, beans, and lots of fruits and vegetables. Home safety  Remove any tripping hazards, such as rugs, cords, and clutter.  Install safety equipment such as grab bars in bathrooms and safety rails on stairs.  Keep rooms and walkways well-lit. Activity   Follow a regular exercise program to stay fit. This will help you maintain your balance. Ask your health care provider what types of exercise are appropriate for you.  If you need a cane or walker, use it as recommended by your health care provider.  Wear supportive shoes that have nonskid soles. Lifestyle  Do not drink alcohol if your health care provider tells you not to drink.  If you drink alcohol, limit how much you have: ? 0-1 drink a day for women. ? 0-2 drinks a day for men.  Be aware of how much alcohol is in your drink. In the [place] one drink equals one typical bottle of beer (12 oz), one-half glass of wine (5 oz), or one shot of hard liquor (1 oz).  Do not use any products that contain nicotine or tobacco, such as cigarettes and e-cigarettes. If you need help quitting, ask your health care provider. Summary  Having a healthy lifestyle and getting preventive care can help to protect your health and wellness after age 59.  Screening and testing are the best way to find a health problem early and help you avoid having a fall. Early diagnosis and treatment give you the best chance  for managing medical conditions that are more common for people who are older than age 20.  Falls are a major cause of broken bones and head injuries in people who are older than age 74. Take precautions to prevent a fall at home.  Work with your health care provider to learn what changes you can make to improve your health and wellness and to prevent falls. This information is not intended to replace advice given to you by your health care provider. Make sure you discuss any questions you have with your health care provider. Document Released: 12/07/2016 Document Revised: 12/07/2016 Document Reviewed: 12/07/2016 Elsevier Interactive Patient Education  2019 [employer].

## 2018-07-05 NOTE — Progress Notes (Signed)
Medical screening examination/treatment/procedure(s) were performed by the Wellness Coach, RN. As primary care provider I was immediately available for consulation/collaboration. I agree with above documentation. Michell Kader, AGNP-C 

## 2018-07-12 NOTE — Progress Notes (Signed)
[id number] [address], [place] [address] Phone: [phone] Fax: [phone]      Your procedure is scheduled on June 12  Report to [place] Entrance "A" at 0530 A.M., and check in at the Admitting office.  Call this number if you have problems the morning of surgery:  [phone]  Call [phone] if you have any questions prior to your surgery date Monday-Friday 8am-4pm    Remember:  Do not eat or drink after midnight.  You may drink clear liquids until 0430 am the morning of surgery .   Clear liquids allowed are: Water, Non-Citrus Juices (without pulp), Carbonated Beverages, Clear Tea, Black Coffee Only, and Gatorade    Take these medicines the morning of surgery with A SIP OF WATER  carvedilol (COREG)  mometasone (NASONEX)  7 days prior to surgery STOP taking any Aspirin (unless otherwise instructed by your surgeon), Aleve, Naproxen, Ibuprofen, Motrin, Advil, Goody's, BC's, all herbal medications, fish oil, and all vitamins.  Follow your surgeon's instructions on when to stop Aspirin.  If no instructions were given by your surgeon then you will need to call the office to get those instructions.     WHAT DO I DO ABOUT MY DIABETES MEDICATION?   Marland Kitchen Do not take oral diabetes medicines (pills) the morning of surgery. sitaGLIPtin (JANUVIA)  How to Manage Your Diabetes Before and After Surgery  Why is it important to control my blood sugar before and after surgery? . Improving blood sugar levels before and after surgery helps healing and can limit problems. . A way of improving blood sugar control is eating a healthy diet by: o  Eating less sugar and carbohydrates o  Increasing activity/exercise o  Talking with your doctor about reaching your blood sugar goals . High blood sugars (greater than 180 mg/dL) can raise your risk of infections and slow your recovery, so you will need to focus on controlling your  diabetes during the weeks before surgery. . Make sure that the doctor who takes care of your diabetes knows about your planned surgery including the date and location.  How do I manage my blood sugar before surgery? . Check your blood sugar at least 4 times a day, starting 2 days before surgery, to make sure that the level is not too high or low. o Check your blood sugar the morning of your surgery when you wake up and every 2 hours until you get to the Short Stay unit. . If your blood sugar is less than 70 mg/dL, you will need to treat for low blood sugar: o Do not take insulin. o Treat a low blood sugar (less than 70 mg/dL) with  cup of clear juice (cranberry or apple), 4 glucose tablets, OR glucose gel. o Recheck blood sugar in 15 minutes after treatment (to make sure it is greater than 70 mg/dL). If your blood sugar is not greater than 70 mg/dL on recheck, call [phone] for further instructions. . Report your blood sugar to the short stay nurse when you get to Short Stay.  . If you are admitted to the hospital after surgery: o Your blood sugar will be checked by the staff and you will probably be given insulin after surgery (instead of oral diabetes medicines) to make sure you have good blood sugar levels. o The goal for blood sugar control after surgery is 80-180 mg/dL.   The Morning of Surgery  Do not wear jewelry, make-up  or nail polish.  Do not wear lotions, powders, or perfumes/colognes, or deodorant  Do not shave 48 hours prior to surgery.   Do not bring valuables to the hospital.  [facility] is not responsible for any belongings or valuables.  If you are a smoker, DO NOT Smoke 24 hours prior to surgery IF you wear a CPAP at night please bring your mask, tubing, and machine the morning of surgery   Remember that you must have someone to transport you home after your surgery, and remain with you for 24 hours if you are discharged the same day.   Contacts, glasses, hearing  aids, dentures or bridgework may not be worn into surgery.    Leave your suitcase in the car.  After surgery it may be brought to your room.  For patients admitted to the hospital, discharge time will be determined by your treatment team.  Patients discharged the day of surgery will not be allowed to drive home.    Special instructions:   [facility]- Preparing For Surgery  Before surgery, you can play an important role. Because skin is not sterile, your skin needs to be as free of germs as possible. You can reduce the number of germs on your skin by washing with CHG (chlorahexidine gluconate) Soap before surgery.  CHG is an antiseptic cleaner which kills germs and bonds with the skin to continue killing germs even after washing.    Oral Hygiene is also important to reduce your risk of infection.  Remember - BRUSH YOUR TEETH THE MORNING OF SURGERY WITH YOUR REGULAR TOOTHPASTE  Please do not use if you have an allergy to CHG or antibacterial soaps. If your skin becomes reddened/irritated stop using the CHG.  Do not shave (including legs and underarms) for at least 48 hours prior to first CHG shower. It is OK to shave your face.  Please follow these instructions carefully.   1. Shower the NIGHT BEFORE SURGERY and the MORNING OF SURGERY with CHG Soap.   2. If you chose to wash your hair, wash your hair first as usual with your normal shampoo.  3. After you shampoo, rinse your hair and body thoroughly to remove the shampoo.  4. Use CHG as you would any other liquid soap. You can apply CHG directly to the skin and wash gently with a scrungie or a clean washcloth.   5. Apply the CHG Soap to your body ONLY FROM THE NECK DOWN.  Do not use on open wounds or open sores. Avoid contact with your eyes, ears, mouth and genitals (private parts). Wash Face and genitals (private parts)  with your normal soap.   6. Wash thoroughly, paying special attention to the area where your surgery will be  performed.  7. Thoroughly rinse your body with warm water from the neck down.  8. DO NOT shower/wash with your normal soap after using and rinsing off the CHG Soap.  9. Pat yourself dry with a CLEAN TOWEL.  10. Wear CLEAN PAJAMAS to bed the night before surgery, wear comfortable clothes the morning of surgery  11. Place CLEAN SHEETS on your bed the night of your first shower and DO NOT SLEEP WITH PETS.    Day of Surgery:  Do not apply any deodorants/lotions.  Please wear clean clothes to the hospital/surgery center.   Remember to brush your teeth WITH YOUR REGULAR TOOTHPASTE.   Please read over the following fact sheets that you were given.

## 2018-07-13 ENCOUNTER — Inpatient Hospital Stay (HOSPITAL_COMMUNITY)
Admission: RE | Admit: 2018-07-13 | Discharge: 2018-07-13 | Disposition: A | Discharge status: Home / Self Care | Payer: Self-pay | Source: Ambulatory Visit

## 2018-07-20 ENCOUNTER — Ambulatory Visit: Admit: 2018-07-20 | Payer: PPO | Admitting: General Surgery

## 2018-07-20 SURGERY — BREAST LUMPECTOMY WITH RADIOACTIVE SEED LOCALIZATION
Anesthesia: General | Site: Breast | Laterality: Right

## 2018-08-06 ENCOUNTER — Other Ambulatory Visit: Payer: Self-pay | Admitting: Nurse Practitioner

## 2018-08-06 DIAGNOSIS — I10 Essential (primary) hypertension: Secondary | ICD-10-CM

## 2018-08-14 ENCOUNTER — Other Ambulatory Visit: Payer: Self-pay | Admitting: Cardiology

## 2018-08-14 NOTE — Telephone Encounter (Signed)
Rx(s) sent to pharmacy electronically.  

## 2018-09-07 DIAGNOSIS — G4733 Obstructive sleep apnea (adult) (pediatric): Secondary | ICD-10-CM | POA: Diagnosis not present

## 2018-09-10 LAB — HM DIABETES FOOT EXAM: HM Diabetic Foot Exam: NORMAL

## 2018-09-24 ENCOUNTER — Other Ambulatory Visit: Payer: Self-pay

## 2018-09-24 ENCOUNTER — Encounter: Payer: Self-pay | Admitting: Nurse Practitioner

## 2018-09-24 ENCOUNTER — Ambulatory Visit (INDEPENDENT_AMBULATORY_CARE_PROVIDER_SITE_OTHER): Payer: PPO | Admitting: Nurse Practitioner

## 2018-09-24 VITALS — Ht 68.0 in | Wt 350.0 lb

## 2018-09-24 DIAGNOSIS — R6889 Other general symptoms and signs: Secondary | ICD-10-CM | POA: Diagnosis not present

## 2018-09-24 DIAGNOSIS — Z20822 Contact with and (suspected) exposure to covid-19: Secondary | ICD-10-CM

## 2018-09-24 MED ORDER — GUAIFENESIN-DM 100-10 MG/5ML PO SYRP: 10.0000 mL | ORAL_SOLUTION | Freq: Three times a day (TID) | ORAL | 0 refills | Status: DC | PRN

## 2018-09-24 MED ORDER — ACETAMINOPHEN 500 MG PO TABS: 500.0000 mg | ORAL_TABLET | Freq: Four times a day (QID) | ORAL | 0 refills | Status: DC | PRN

## 2018-09-24 MED ORDER — PEPTO-BISMOL 524 MG/30ML PO SUSP: 30.0000 mL | Freq: Four times a day (QID) | ORAL | 0 refills | Status: DC | PRN

## 2018-09-24 NOTE — Patient Instructions (Signed)
Go to [facility] test site for COVID test: [address]. Informed to be at test site no later than 3:45pm  This information is directly available on the CDC website: RunningShows.co.za.html    Source:CDC Reference to specific commercial products, manufacturers, companies, or trademarks does not constitute its endorsement or recommendation by the [place], [place], or Centers for [employer] and Prevention.

## 2018-09-24 NOTE — Progress Notes (Addendum)
  Interactive audio and video telecommunications were attempted between this provider and patient, however failed, due to patient having technical difficulties OR patient did not have access to video capability.  We continued and completed visit with audio only.   Virtual Visit via Video Note  I connected with Stacey Page on 09/24/18 at 10:45 AM EDT by a video enabled telemedicine application and verified that I am speaking with the correct person using two identifiers.  Location: Patient: Home Provider: Office.   I discussed the limitations of evaluation and management by telemedicine and the availability of in person appointments. The patient expressed understanding and agreed to proceed.  CC: pt is c/o sore throat,headache,diarrhea,chills,cooughing a little,wheezing and sob at times/2 days.  History of Present Illness: URI  This is a new problem. Episode onset: 2days ago. The problem has been unchanged. There has been no fever (chills and hot flashes). Associated symptoms include congestion, coughing, headaches, joint pain, nausea, rhinorrhea, a sore throat and wheezing. Pertinent negatives include no abdominal pain, chest pain, diarrhea, dysuria, ear pain, joint swelling, neck pain, plugged ear sensation, rash, sinus pain or sneezing. Associated symptoms comments: Soft stools. She has tried nothing for the symptoms.  hx of chronic SOB with exertion, denies any worsening. SOB and wheezing improves with use of albuterol inhaler, denies any increased need for inhaler. No PND, no chest pain, non-productive cough   Reviewed medication and problem list.  Observations/Objective: Physical Exam  Constitutional: She is oriented to person, place, and time. No distress.  Neurological: She is alert and oriented to person, place, and time.  normal voice and speech  Assessment and Plan: Stacey Page was seen today for sore throat.  Diagnoses and all orders for this visit:  Flu-like  symptoms -     Novel Coronavirus, NAA ([facility]) -     acetaminophen (TYLENOL) 500 MG tablet; Take 1 tablet (500 mg total) by mouth every 6 (six) hours as needed. -     bismuth subsalicylate (PEPTO-BISMOL) 262 MG/15ML suspension; Take 30 mLs by mouth every 6 (six) hours as needed. -     guaiFENesin-dextromethorphan (ROBITUSSIN DM) 100-10 MG/5ML syrup; Take 10 mLs by mouth every 8 (eight) hours as needed for cough. -     MYCHART COVID-19 HOME MONITORING PROGRAM -     Temperature monitoring; Future   Follow Up Instructions: See avs   I discussed the assessment and treatment plan with the patient. The patient was provided an opportunity to ask questions and all were answered. The patient agreed with the plan and demonstrated an understanding of the instructions.   The patient was advised to call back or seek an in-person evaluation if the symptoms worsen or if the condition fails to improve as anticipated.  Spent 25mins with Stacey Page on the Telephone.  Wilfred Lacy, NP

## 2018-09-25 ENCOUNTER — Encounter: Payer: Self-pay | Admitting: Nurse Practitioner

## 2018-09-26 LAB — NOVEL CORONAVIRUS, NAA: SARS-CoV-2, NAA: NOT DETECTED

## 2018-10-03 ENCOUNTER — Telehealth: Payer: Self-pay | Admitting: Nurse Practitioner

## 2018-10-03 NOTE — Telephone Encounter (Signed)
Pt was wondering if she still under the quarantine since her test is negative, absence of any symptoms since 09/27/2018. Please advise.    Copied from [place] [id number]. Topic: General - Inquiry >> Oct 03, 2018  8:29 AM [name] wrote: Reason for CRM:  Pt is confused if she should continue to quarantine, since her covid test neg.  She say she keeps getting mychart message to continue pls call ,thanks!

## 2018-10-03 NOTE — Telephone Encounter (Signed)
Pt is aware, ok to get flu shot since pt is absence of respiratory symptoms since 09/27/2018--per [facility]

## 2018-10-03 NOTE — Telephone Encounter (Signed)
Home isolation can be discontinued

## 2018-10-04 DIAGNOSIS — G4733 Obstructive sleep apnea (adult) (pediatric): Secondary | ICD-10-CM | POA: Diagnosis not present

## 2018-10-08 ENCOUNTER — Telehealth: Payer: Self-pay | Admitting: *Deleted

## 2018-10-08 NOTE — Telephone Encounter (Signed)
SPOKE  PATIENT SHE WOULD LIKE TO HAVE A OFFICE VISIT RATHER THAN  VIRTUAL . APPOINTMENT CHANGE TO 11/06/18.

## 2018-10-09 ENCOUNTER — Ambulatory Visit: Payer: PPO | Admitting: Cardiology

## 2018-10-10 ENCOUNTER — Ambulatory Visit: Payer: PPO

## 2018-10-19 ENCOUNTER — Other Ambulatory Visit: Payer: Self-pay | Admitting: Cardiology

## 2018-10-31 ENCOUNTER — Encounter: Payer: Self-pay | Admitting: Nurse Practitioner

## 2018-10-31 NOTE — Progress Notes (Signed)
Abstracted result and sent to scan  

## 2018-11-01 LAB — HM DIABETES EYE EXAM

## 2018-11-06 ENCOUNTER — Ambulatory Visit (INDEPENDENT_AMBULATORY_CARE_PROVIDER_SITE_OTHER): Payer: PPO | Admitting: Cardiology

## 2018-11-06 ENCOUNTER — Encounter: Payer: Self-pay | Admitting: Cardiology

## 2018-11-06 ENCOUNTER — Other Ambulatory Visit: Payer: Self-pay

## 2018-11-06 VITALS — BP 135/65 | HR 70 | Temp 96.0°F | Ht 68.0 in | Wt 352.8 lb

## 2018-11-06 DIAGNOSIS — R0600 Dyspnea, unspecified: Secondary | ICD-10-CM

## 2018-11-06 DIAGNOSIS — I350 Nonrheumatic aortic (valve) stenosis: Secondary | ICD-10-CM

## 2018-11-06 DIAGNOSIS — E785 Hyperlipidemia, unspecified: Secondary | ICD-10-CM

## 2018-11-06 DIAGNOSIS — R0609 Other forms of dyspnea: Secondary | ICD-10-CM | POA: Diagnosis not present

## 2018-11-06 DIAGNOSIS — G4733 Obstructive sleep apnea (adult) (pediatric): Secondary | ICD-10-CM | POA: Diagnosis not present

## 2018-11-06 DIAGNOSIS — I110 Hypertensive heart disease with heart failure: Secondary | ICD-10-CM

## 2018-11-06 DIAGNOSIS — I5032 Chronic diastolic (congestive) heart failure: Secondary | ICD-10-CM

## 2018-11-06 MED ORDER — CARVEDILOL 25 MG PO TABS: 25.0000 mg | ORAL_TABLET | Freq: Two times a day (BID) | ORAL | 3 refills | Status: DC

## 2018-11-06 NOTE — Assessment & Plan Note (Signed)
No wgt loss yet - is walking 1d / week --> needs to increase to daily.  Dietary discretion discussed.

## 2018-11-06 NOTE — Patient Instructions (Addendum)
Medication Instructions:   -INCREASE CARVEDILOL (COREG ) 25 MG TWICE A DAY  If you need a refill on your cardiac medications before your next appointment, please call your pharmacy.   Lab work:  NOT NEEDED  Testing/Procedures: WILL BE SCHEDULE AT [facility]- AUG/SEPT 2021 Your physician has requested that you have an echocardiogram. Echocardiography is a painless test that uses sound waves to create images of your heart. It provides your doctor with information about the size and shape of your heart and how well your heart's chambers and valves are working. This procedure takes approximately one hour. There are no restrictions for this procedure.   Follow-Up: At [facility], you and your health needs are our priority.  As part of our continuing mission to provide you with exceptional heart care, we have created designated Provider Care Teams.  These Care Teams include your primary Cardiologist (physician) and Advanced Practice Providers (APPs -  Physician Assistants and Nurse Practitioners) who all work together to provide you with the care you need, when you need it. . You will need a follow up appointment in 12 months- SEPT 2021.  Please call our office 2 months in advance to schedule this appointment.  You may see Glenetta Hew, MD or one of the following Advanced Practice Providers on your designated Care Team:   . Rosaria Ferries, PA-C . Jory Sims, DNP, ANP  Any Other Special Instructions Will Be Listed Below (If Applicable).

## 2018-11-06 NOTE — Assessment & Plan Note (Addendum)
Continue to use CPAP- will reassess RV pressures on Echo next year.

## 2018-11-06 NOTE — Assessment & Plan Note (Signed)
Mild stenosis  -- murmur stable.  F/u Echo prior to annual f/u next year.

## 2018-11-06 NOTE — Progress Notes (Signed)
PCP: Stacey Buffy, NP  Clinic Note: Chief Complaint  Patient presents with  . Annual Exam    HPI: Stacey Page is a 68 y.o. female with a PMH notable for Morbid Obesity, HFpEF (HTN Heart Dz), hypertension, hyperlipidemia and diabetes along with OSA on CPAP as well as asthma.    She was initially seen on March 23, 2017 by Stacey Ferries, PA - evaluation of CHF w/ c/o wgt gain and SOB.  + 40 LB since Feb 2018 knee surgery.  Noted exertional dyspnea with walking and feels tired all the time. compliant with CPAP.  ->  Thought to be mildly overloaded from a volume standpoint.     Stacey Page was last seen on Sept 1, 2019 -> noting that breathing was much improved.  Still sleeping sitting up with CPAP.  Not related.  Does not really exercise. -->  We ordered a coronary CT angiogram to exclude ischemic CAD - was low Risk.   Recent Hospitalizations: None  Studies Personally Reviewed - (if available, images/films reviewed: From Epic Chart or Care Everywhere)    Interval History: Stacey Page is here for her follow-up appointment noting that she has labile weight pump.  They will go up and down depending on time of season, meals, her level activity.  She was doing really well with diet and exercise, but then got out of the bandwagon.  She was hoping to join a gym, but she just never did so she is still pretty much the same weight.  She says that she still has a little mild puffy ankle edema but no real PND or orthopnea per se is mostly body habitus related.  She has to sleep with CPAP.  No real orthopnea.  She has intermittent episodes of chest discomfort all across her chest that are little short stabbing sensations but nothing that is routinely with exertion and relieved with rest.  She points out that it is really hard for her to do walking and activity with the mask on.  She has breathing issues with the mask but she has made a concerted effort now to try to start back walking.   Her plan is to start walking 1 day a week and then gradually increase days of the week after every couple weeks.  She is hoping to build build up to walking just about every day and then increase how much she walks.  Cardiovascular review of symptoms: positive for - dyspnea on exertion and Mostly related to weight and deconditioning.  Mild swelling.  But no real edema. negative for - irregular heartbeat, rapid heart rate, shortness of breath or Real anginal type chest pain, syncope/near syncope or TIA/amaurosis fugax.   ROS: A comprehensive was performed. Review of Systems  Constitutional: Negative for malaise/fatigue and weight loss (Actually stable weight).  HENT: Negative for congestion and nosebleeds.   Respiratory: Positive for shortness of breath (Chronic). Negative for cough and wheezing.   Gastrointestinal: Negative for abdominal pain, blood in stool and melena.  Genitourinary: Negative for hematuria.  Musculoskeletal: Positive for joint pain (Postop knee surgery - still stiff, less ROM; bilateral foot pain). Negative for falls.  Neurological: Negative for dizziness and headaches.  Psychiatric/Behavioral: Negative for depression and memory loss. The patient is not nervous/anxious and does not have insomnia.        Does well with CPAP  All other systems reviewed and are negative.  The patient does not have symptoms concerning for COVID-19 infection (fever, chills, cough, or new  shortness of breath).  The patient is practicing social distancing.  COVID-19 Education: The signs and symptoms of COVID-19 were discussed with the patient and how to seek care for testing (follow up with PCP or arrange E-visit).   The importance of social distancing was discussed today.   I have reviewed and (if needed) personally updated the patient's problem list, medications, allergies, past medical and surgical history, social and family history.   Past Medical History:  Diagnosis Date  . Anxiety     doesn't take any meds  . Arthritis of right knee 03/14/2016  . Asthma   . Chronic diastolic CHF (congestive heart failure) (HCC)    HF with Preserved EF (60-65%) - Grade II Diastolic Dysfunction (Hypertensive Heart Disease). takes Furosemide daily  . COPD (chronic obstructive pulmonary disease) (HCC)    Albuterol as needed  . Depression    doesn't take meds  . Diabetes ([place])    takes Januvia daily  . Eczema    uses cream as needed  . GERD (gastroesophageal reflux disease)   . History of bronchitis as a child   . HTN (hypertension)   . Insomnia   . Mild aortic stenosis by prior echocardiogram 04/2017   Mild stenosis: Mean gradient 15 mmHg, peak gradient 28 mmHg  . OA (osteoarthritis)   . OSA (obstructive sleep apnea) 02/25/2014   wears CPAP at night  . Peripheral neuropathy    takes Gabapentin as needed  . Pneumonia    hx of-2010  . Seasonal allergies    uses Flonase daily    Past Surgical History:  Procedure Laterality Date  . CESAREAN SECTION  x2  . COLONOSCOPY    . CORONARY CALCIUM SCORE & CT ANGIOGRAM  09/2017   Coronary Ca Score = 11 (low).  CTA- no obstructive CAD (minimal disease)  . JOINT REPLACEMENT    . KNEE ARTHROSCOPY Right   . LUNG BIOPSY Right 06/10/2014   Procedure: LUNG BIOPSY;  Surgeon: Ivin Poot, MD;  Location: [facility];  Service: Thoracic;  Laterality: Right;  . POLYPECTOMY     throat  . TOTAL KNEE ARTHROPLASTY Right 03/14/2016   Procedure: RIGHT TOTAL KNEE ARTHROPLASTY;  Surgeon: Marybelle Killings, MD;  Location: [facility];  Service: Orthopedics;  Laterality: Right;  . TRANSTHORACIC ECHOCARDIOGRAM  03/2017    EF 60-65% moderate concentric LVH GRII DD.  Mild aortic stenosis (mean gradient 15 mmHg, peak gradient 28 mmHg).  . VIDEO ASSISTED THORACOSCOPY Right 06/10/2014   Procedure: VIDEO ASSISTED THORACOSCOPY;  Surgeon: Ivin Poot, MD;  Location: [facility];  Service: Thoracic;  Laterality: Right;   Coronary CTA: Coronary calcium score 11, no obstructive CAD.   Current Meds  Medication Sig  . acetaminophen (TYLENOL) 500 MG tablet Take 1 tablet (500 mg total) by mouth every 6 (six) hours as needed.  Marland Kitchen albuterol (PROAIR HFA) 108 (90 Base) MCG/ACT inhaler Inhale 2 puffs into the lungs every 6 (six) hours as needed for wheezing or shortness of breath.  Marland Kitchen aspirin 81 MG tablet Take 1 tablet (81 mg total) by mouth daily with breakfast.  . atorvastatin (LIPITOR) 20 MG tablet Take 1 tablet (20 mg total) by mouth daily at 6 PM.  . bismuth subsalicylate (PEPTO-BISMOL) 262 MG/15ML suspension Take 30 mLs by mouth every 6 (six) hours as needed.  . Calcium Carbonate-Vitamin D (CALCIUM 600+D) 600-400 MG-UNIT tablet Take 1 tablet by mouth daily.  . cholecalciferol (VITAMIN D) 1000 units tablet Take 1,000 Units by mouth daily.  Marland Kitchen  ciprofloxacin (CIPRO) 250 MG tablet Take 1 tablet (250 mg total) by mouth 2 (two) times daily.  . Clobetasol Prop Emollient Base (CLOBETASOL PROPIONATE E) 0.05 % emollient cream Apply 1 application topically daily as needed (skin irritations).  . ferrous sulfate (FEROSUL) 325 (65 FE) MG tablet Take 325 mg by mouth daily with breakfast.  . furosemide (LASIX) 40 MG tablet TAKE 1 TAB DAILY, TAKE AN ADDITIONAL TAB AT 2 PM IF INCREASED SWELLING AND/OR SHORTNESS OF BREATH  . guaiFENesin-dextromethorphan (ROBITUSSIN DM) 100-10 MG/5ML syrup Take 10 mLs by mouth every 8 (eight) hours as needed for cough.  Marland Kitchen lisinopril (ZESTRIL) 20 MG tablet TAKE 1 TABLET BY MOUTH EVERY DAY  . mometasone (NASONEX) 50 MCG/ACT nasal spray Place 2 sprays into the nose daily.  . montelukast (SINGULAIR) 10 MG tablet Take 1 tablet (10 mg total) by mouth at bedtime.  . sitaGLIPtin (JANUVIA) 100 MG tablet Take 1 tablet (100 mg total) by mouth daily.  . vitamin C (ASCORBIC ACID) 500 MG tablet Take 500 mg by mouth daily.   . [DISCONTINUED] carvedilol (COREG) 12.5 MG tablet TAKE 1.5 TABLETS (18.75 MG TOTAL) BY MOUTH 2 (TWO) TIMES DAILY WITH A MEAL.    No Known Allergies  Social  History   Tobacco Use  . Smoking status: Former Smoker    Packs/day: 0.25    Years: 15.00    Pack years: 3.75    Types: Cigarettes    Quit date: 02/08/1980    Years since quitting: 38.7  . Smokeless tobacco: Former Systems developer  . Tobacco comment: 2 packs per week  Substance Use Topics  . Alcohol use: Yes    Alcohol/week: 0.0 standard drinks    Comment: occassional/social/rare  . Drug use: No   Social History   Social History Narrative  . Not on file   ` family history includes Anemia in her father; COPD in her father; Cancer (age of onset: 28) in her mother; Hypothyroidism in her father.  Wt Readings from Last 3 Encounters:  11/06/18 (!) 352 lb 12.8 oz (160 kg)  09/24/18 (!) 350 lb (158.8 kg)  03/27/18 (!) 357 lb 12.8 oz (162.3 kg)  9/19- 353 lb  PHYSICAL EXAM BP 135/65   Pulse 70   Temp (!) 96 F (35.6 C)   Ht 5\' 8"  (1.727 m)   Wt (!) 352 lb 12.8 oz (160 kg)   SpO2 96%   BMI 53.64 kg/m   Follow-up BP after 5 min rest  119/61 mHg Physical Exam  Constitutional: She is oriented to person, place, and time. No distress.  Well-groomed morbidly obese woman  HENT:  Head: Normocephalic and atraumatic.  Neck: Normal range of motion. Neck supple. No hepatojugular reflux (Unable to assess) and no JVD (unable to assess) present. Carotid bruit is not present (Radiated aortic murmur).  Unable to assess due to body habitus  Cardiovascular: Normal rate, regular rhythm, S1 normal and S2 normal.  No extrasystoles are present. PMI is not displaced (Unable to assess). Exam reveals distant heart sounds and decreased pulses (More because of edema.  Difficult to palpate.). Exam reveals no gallop and no friction rub.  Murmur heard.  Harsh crescendo-decrescendo midsystolic murmur is present with a grade of 2/6 at the upper right sternal border radiating to the neck. Pulmonary/Chest: Effort normal and breath sounds normal. She has no wheezes. She has no rales.  Increased work of breathing, but  nondistressed.  Musculoskeletal: Normal range of motion.        General:  Edema (puffy swelling, not truly edema.) present.  Neurological: She is alert and oriented to person, place, and time.  Psychiatric: She has a normal mood and affect. Her behavior is normal. Thought content normal.  Nursing note and vitals reviewed.   Adult ECG Report Sinus rhythm, rate 75.  Prolonged QT for LVH with repolarization changes.  Lateral T wave inversions noted, but otherwise normal axis, intervals and durations. -Relatively stable EKG, rate faster  Other studies Reviewed: Additional studies/ records that were reviewed today include:  Recent Labs:   Lab Results  Component Value Date   CREATININE 1.34 (H) 01/16/2018   BUN 19 01/16/2018   NA 141 01/16/2018   K 4.9 01/16/2018   CL 102 01/16/2018   CO2 31 01/16/2018   Lab Results  Component Value Date   CHOL 132 01/16/2018   HDL 55.60 01/16/2018   LDLCALC 60 01/16/2018   TRIG 81.0 01/16/2018   CHOLHDL 2 01/16/2018    ASSESSMENT / PLAN: Problem List Items Addressed This Visit    Hypertensive heart disease with chronic diastolic congestive heart failure (HCC) - Primary (Chronic)    BP stable - but remains borderline. Volume status stable - euvolemic.  Plan - continue current dose of Lisinopril  Titrate Coreg to 25 mg BID  Continue daily lasix       Relevant Medications   carvedilol (COREG) 25 MG tablet   Other Relevant Orders   EKG 12-Lead (Completed)   ECHOCARDIOGRAM COMPLETE   OSA (obstructive sleep apnea) (Chronic)    Continue to use CPAP- will reassess RV pressures on Echo next year.      Relevant Orders   EKG 12-Lead (Completed)   Morbid obesity (HCC) (Chronic)    No wgt loss yet - is walking 1d / week --> needs to increase to daily.  Dietary discretion discussed.      Relevant Orders   ECHOCARDIOGRAM COMPLETE   Hyperlipidemia LDL goal <70 (Chronic)    Lipids look great as of December 2019.  Probably due to have them  rechecked again this winter.  Is on atorvastatin at stable dose.  She is at target.  No change.      Relevant Medications   carvedilol (COREG) 25 MG tablet   Mild aortic stenosis by prior echocardiogram (Chronic)    Mild stenosis  -- murmur stable.  F/u Echo prior to annual f/u next year.      Relevant Medications   carvedilol (COREG) 25 MG tablet   Other Relevant Orders   EKG 12-Lead (Completed)   ECHOCARDIOGRAM COMPLETE   DOE (dyspnea on exertion)    Multifactorial.  There may be some component of diastolic dysfunction but probably more related to OHS and just overall obesity with deconditioning.  I think she just needs to continue to exercise.         Current medicines are reviewed at length with the patient today. (+/- concerns) n/a The following changes have been made: see below   Patient Instructions  Medication Instructions:   -INCREASE CARVEDILOL (COREG ) 25 MG TWICE A DAY  If you need a refill on your cardiac medications before your next appointment, please call your pharmacy.   Lab work:  NOT NEEDED  Testing/Procedures: WILL BE SCHEDULE AT [facility]- AUG/SEPT 2021 Your physician has requested that you have an echocardiogram. Echocardiography is a painless test that uses sound waves to create images of your heart. It provides your doctor with information about the size and  shape of your heart and how well your heart's chambers and valves are working. This procedure takes approximately one hour. There are no restrictions for this procedure.   Follow-Up: At [facility], you and your health needs are our priority.  As part of our continuing mission to provide you with exceptional heart care, we have created designated Provider Care Teams.  These Care Teams include your primary Cardiologist (physician) and Advanced Practice Providers (APPs -  Physician Assistants and Nurse Practitioners) who all work together to provide you with the  care you need, when you need it. . You will need a follow up appointment in 12 months- SEPT 2021.  Please call our office 2 months in advance to schedule this appointment.  You may see Glenetta Hew, MD or one of the following Advanced Practice Providers on your designated Care Team:   . Stacey Ferries, PA-C . Jory Sims, DNP, ANP  Any Other Special Instructions Will Be Listed Below (If Applicable).       Studies Ordered:   Orders Placed This Encounter  Procedures  . EKG 12-Lead  . ECHOCARDIOGRAM COMPLETE      Glenetta Hew, M.D., M.S. Interventional Cardiologist   Pager # [phone] Phone # [phone] [address] [place], [place] [address]   Thank you for choosing Heart[facility] at [facility]!!

## 2018-11-06 NOTE — Assessment & Plan Note (Signed)
BP stable - but remains borderline. Volume status stable - euvolemic.  Plan - continue current dose of Lisinopril  Titrate Coreg to 25 mg BID  Continue daily lasix

## 2018-11-09 NOTE — Assessment & Plan Note (Signed)
Lipids look great as of December 2019.  Probably due to have them rechecked again this winter.  Is on atorvastatin at stable dose.  She is at target.  No change.

## 2018-11-09 NOTE — Assessment & Plan Note (Signed)
Multifactorial.  There may be some component of diastolic dysfunction but probably more related to OHS and just overall obesity with deconditioning.  I think she just needs to continue to exercise.

## 2018-11-21 ENCOUNTER — Ambulatory Visit (INDEPENDENT_AMBULATORY_CARE_PROVIDER_SITE_OTHER): Payer: PPO | Admitting: Nurse Practitioner

## 2018-11-21 ENCOUNTER — Encounter: Payer: Self-pay | Admitting: Nurse Practitioner

## 2018-11-21 ENCOUNTER — Other Ambulatory Visit: Payer: Self-pay

## 2018-11-21 VITALS — Ht 68.0 in

## 2018-11-21 DIAGNOSIS — K047 Periapical abscess without sinus: Secondary | ICD-10-CM | POA: Diagnosis not present

## 2018-11-21 MED ORDER — AMOXICILLIN-POT CLAVULANATE 875-125 MG PO TABS: 1.0000 | ORAL_TABLET | Freq: Two times a day (BID) | ORAL | 0 refills | Status: DC

## 2018-11-21 NOTE — Patient Instructions (Signed)
Schedule appt with dentist within the next 1-2weeks. Start oral abx, take with food.   Dental Pain Dental pain may be caused by many things, including:  Tooth decay (cavities or caries).  Infection.  The inner part of the tooth being filled with pus (abscess).  Injury. Sometimes the cause of pain is unknown. Your pain can vary. It may be mild or severe. You may have it all the time, or it may occur only when you are:  Chewing.  Exposed to hot or cold temperature.  Eating or drinking sugary foods or beverages, such as soda or candy. Follow these instructions at home: Medicines  Take over-the-counter and prescription medicines only as told by your doctor.  If you were prescribed an antibiotic medicine, take it as told by your doctor. Do not stop taking the medicine even if you start to feel better. Eating and drinking  Do not eat foods or drinks that cause you pain. These include: ? Very hot or very cold foods or drinks. ? Sweet or sugary foods or drinks. Managing pain and swelling   Gargle with a salt-water mixture 3-4 times a day. To make this, dissolve -1 tsp of salt in 1 cup of warm water.  If told, put ice on the painful area of your face: ? Put ice in a plastic bag. ? Place a towel between your skin and the bag. ? Leave the ice on for 20 minutes, 2-3 times a day. Brushing your teeth  Brush your teeth twice a day using a fluoride toothpaste.  Floss your teeth once a day.  Use a toothpaste made for sensitive teeth as told by your doctor.  Use a soft toothbrush. General instructions  Do not apply heat to the outside of your face.  Watch your dental pain. Let your doctor know if there are any changes.  Keep all follow-up visits as told by your doctor. This is important. Contact a doctor if:  Your pain is not relieved by medicines.  You have new symptoms.  Your symptoms get worse. Get help right away if:  You cannot open your mouth.  You are having  trouble breathing or swallowing.  You have a fever.  Your face, neck, or jaw is swollen. Summary  Dental pain may be caused by many things, including tooth decay, injury, or infection. In some cases, the cause is not known.  Your pain may be mild or severe. You may have pain all the time, or you may have it only when you eat or drink.  Take over-the-counter and prescription medicines only as told by your doctor.  Watch your dental pain for any changes. Let your doctor know if symptoms get worse. This information is not intended to replace advice given to you by your health care provider. Make sure you discuss any questions you have with your health care provider. Document Released: 07/13/2007 Document Revised: 05/22/2018 Document Reviewed: 02/06/2017 Elsevier Patient Education  2020 [employer].

## 2018-11-21 NOTE — Progress Notes (Signed)
Virtual Visit via Video Note  I connected with Stacey Page on 11/21/18 at  9:45 AM EDT by a video enabled telemedicine application and verified that I am speaking with the correct person using two identifiers.  Location: Patient: home Provider: office   I discussed the limitations of evaluation and management by telemedicine and the availability of in person appointments. The patient expressed understanding and agreed to proceed.  CC: dental pain, difficulty with chewing.  History of Present Illness: Dental Pain  This is a new problem. The current episode started in the past 7 days. The problem occurs constantly. The problem has been gradually worsening. The pain is severe. Associated symptoms include difficulty swallowing, facial pain, sinus pressure and thermal sensitivity. Pertinent negatives include no fever or oral bleeding. She has tried acetaminophen and NSAIDs for the symptoms. The treatment provided no relief.  she is still to make appt with dentist. Last hgbA1c of 6.2, does not check glucose at home.   Observations/Objective: Physical Exam  Constitutional: She is oriented to person, place, and time. No distress.  HENT:  Right Ear: External ear normal.  Left Ear: External ear normal.  Nose: Left sinus exhibits maxillary sinus tenderness.  Mouth/Throat: No trismus in the jaw. Abnormal dentition. Dental caries present. No uvula swelling. No posterior oropharyngeal edema or posterior oropharyngeal erythema.    Can not appreciated facial asymmetry or periorbital swelling.  Neck: Normal range of motion. Neck supple.  Pulmonary/Chest: Effort normal.  Neurological: She is alert and oriented to person, place, and time.   Assessment and Plan: [name] was seen today for choking.  Diagnoses and all orders for this visit:  Dental abscess -     amoxicillin-clavulanate (AUGMENTIN) 875-125 MG tablet; Take 1 tablet by mouth 2 (two) times daily.   Follow Up Instructions: See  avs   I discussed the assessment and treatment plan with the patient. The patient was provided an opportunity to ask questions and all were answered. The patient agreed with the plan and demonstrated an understanding of the instructions.   The patient was advised to call back or seek an in-person evaluation if the symptoms worsen or if the condition fails to improve as anticipated.  Wilfred Lacy, NP

## 2018-11-27 ENCOUNTER — Telehealth: Payer: Self-pay | Admitting: Nurse Practitioner

## 2018-11-27 NOTE — Telephone Encounter (Signed)

## 2018-11-28 ENCOUNTER — Encounter: Payer: Self-pay | Admitting: Nurse Practitioner

## 2018-11-28 ENCOUNTER — Other Ambulatory Visit: Payer: Self-pay

## 2018-11-28 ENCOUNTER — Ambulatory Visit (INDEPENDENT_AMBULATORY_CARE_PROVIDER_SITE_OTHER): Payer: PPO | Admitting: Nurse Practitioner

## 2018-11-28 VITALS — BP 154/94 | HR 56 | Temp 97.4°F | Ht 68.0 in | Wt 357.0 lb

## 2018-11-28 DIAGNOSIS — I5032 Chronic diastolic (congestive) heart failure: Secondary | ICD-10-CM | POA: Diagnosis not present

## 2018-11-28 DIAGNOSIS — H6121 Impacted cerumen, right ear: Secondary | ICD-10-CM | POA: Diagnosis not present

## 2018-11-28 DIAGNOSIS — E1151 Type 2 diabetes mellitus with diabetic peripheral angiopathy without gangrene: Secondary | ICD-10-CM | POA: Insufficient documentation

## 2018-11-28 DIAGNOSIS — Z23 Encounter for immunization: Secondary | ICD-10-CM | POA: Diagnosis not present

## 2018-11-28 DIAGNOSIS — E1142 Type 2 diabetes mellitus with diabetic polyneuropathy: Secondary | ICD-10-CM | POA: Diagnosis not present

## 2018-11-28 DIAGNOSIS — G63 Polyneuropathy in diseases classified elsewhere: Secondary | ICD-10-CM | POA: Diagnosis not present

## 2018-11-28 DIAGNOSIS — I110 Hypertensive heart disease with heart failure: Secondary | ICD-10-CM

## 2018-11-28 DIAGNOSIS — R413 Other amnesia: Secondary | ICD-10-CM | POA: Insufficient documentation

## 2018-11-28 DIAGNOSIS — E785 Hyperlipidemia, unspecified: Secondary | ICD-10-CM

## 2018-11-28 DIAGNOSIS — F3342 Major depressive disorder, recurrent, in full remission: Secondary | ICD-10-CM

## 2018-11-28 DIAGNOSIS — N1831 Chronic kidney disease, stage 3a: Secondary | ICD-10-CM

## 2018-11-28 DIAGNOSIS — F332 Major depressive disorder, recurrent severe without psychotic features: Secondary | ICD-10-CM | POA: Insufficient documentation

## 2018-11-28 LAB — BASIC METABOLIC PANEL
BUN: 18 mg/dL (ref 6–23)
CO2: 28 mEq/L (ref 19–32)
Calcium: 9.4 mg/dL (ref 8.4–10.5)
Chloride: 103 mEq/L (ref 96–112)
Creatinine, Ser: 1.18 mg/dL (ref 0.40–1.20)
GFR: 55.28 mL/min — ABNORMAL LOW (ref >60.00)
Glucose, Bld: 93 mg/dL (ref 70–99)
Potassium: 4.6 mEq/L (ref 3.5–5.1)
Sodium: 138 mEq/L (ref 135–145)

## 2018-11-28 LAB — LIPID PANEL
Cholesterol: 132 mg/dL (ref 0–200)
HDL: 52.6 mg/dL (ref >39.00)
LDL Cholesterol: 64 mg/dL (ref 0–99)
NonHDL: 79.89
Total CHOL/HDL Ratio: 3
Triglycerides: 78 mg/dL (ref 0.0–149.0)
VLDL: 15.6 mg/dL (ref 0.0–40.0)

## 2018-11-28 LAB — HEPATIC FUNCTION PANEL
ALT: 11 U/L (ref 0–35)
AST: 14 U/L (ref 0–37)
Albumin: 4.1 g/dL (ref 3.5–5.2)
Alkaline Phosphatase: 67 U/L (ref 39–117)
Bilirubin, Direct: 0.1 mg/dL (ref 0.0–0.3)
Total Bilirubin: 0.4 mg/dL (ref 0.2–1.2)
Total Protein: 7.2 g/dL (ref 6.0–8.3)

## 2018-11-28 LAB — HEMOGLOBIN A1C: Hgb A1c MFr Bld: 6.1 % (ref 4.6–6.5)

## 2018-11-28 LAB — TSH: TSH: 0.51 u[IU]/mL (ref 0.35–4.50)

## 2018-11-28 LAB — VITAMIN B12: Vitamin B-12: 280 pg/mL (ref 211–911)

## 2018-11-28 MED ORDER — DEBROX 6.5 % OT SOLN: 5.0000 [drp] | Freq: Two times a day (BID) | OTIC | 0 refills | Status: DC

## 2018-11-28 NOTE — Patient Instructions (Addendum)
Urine microalbumin test is due 01/2019 We will call [facility] to inquire about diabetic eye exam.  Schedule appt on Friday for right ear irrigation Use debrox solution 5drops at bedtime until appt on Friday.  Memory questionaire is normal  Check B12, RPR, TSH Consider referral to neurology if symptoms worsen  Go to lab for blood draw.

## 2018-11-28 NOTE — Assessment & Plan Note (Signed)
She has difficulty with DASH diet and daily exercise. She had appts with nutritionist but had difficulty following their suggestions. She is unable to maintain regular exercise regiment due to knee pain and SOB with exertion. She does recognize the health impacts of her weight. She is requesting for referral to weight loss clinic. Referral entered Wt Readings from Last 3 Encounters:  11/28/18 (!) 357 lb (161.9 kg)  11/06/18 (!) 352 lb 12.8 oz (160 kg)  09/24/18 (!) 350 lb (158.8 kg)

## 2018-11-28 NOTE — Progress Notes (Signed)
Subjective:    Patient ID: Stacey Page, female    DOB: 28-Jun-1950, 68 y.o.   MRN: [id number]  Chief Complaint  Patient presents with  . Follow-up    -fasting--havent talke BP med--consult about Dr. Trixie Rude office.    HPI Right Breast papilloma: Persistent nipple discharge. Denies any pain of enlarged lymph node. Right lumpectomy scheduled for 12/28/2018.  HTN: Stable, elevated this morning. States she has not take AM dose of medications. Recent OV with cardiology 10/2018, coreg dose increased to 25mg  BID. Chronic Persistent SOB with exertion,fluid overload BP Readings from Last 3 Encounters:  11/28/18 (!) 154/94  11/06/18 135/65  03/27/18 128/76   OSA with CPAP: Reports she is complaint with machine Managed by Dr. Halford Chessman (pulmonology), last seen 03/2017.  DM: Controlled with [place]. Last hgbA1c of 6.2 Normal urine microalbumin 01/2018, use of ACE-I Up to date with dental cleaning (upcoming root canal) and eye exam (by [facility]) Chronic LE neuropathy. LDL at goal with lipitor.  Obesity: BMI of 54 She has difficulty with maintain DASH diet and regukar exercise. She had appts with nutritionist but had difficulty following their suggestions. She is unable to maintain regular exercise regiment due to knee pain and SOB with exertion. She is requesting for referral to weight loss clinic.  Today she also reports difficulty with short term memory: forgets words, walks in and out of a room and forgets why she is there, losing car keys. She denies any long term memory loss or difficulty recognizing family or getting lost when driving. She will like to know if Provigil will be beneficial? MMSE - Mini Mental State Exam 11/28/2018  Orientation to time 5  Orientation to Place 5  Registration 3  Attention/ Calculation 5  Recall 3  Language- name 2 objects 2  Language- repeat 1  Language- follow 3 step command 3  Language- read & follow direction 1  Write a sentence 1   Copy design 1  Total score 30   Sexual History (orientation,birth control, marital status, STD): single, not sexually active  Depression/Suicide: Depression screen [facility] 2/9 11/28/2018 11/28/2018 07/04/2018 03/27/2018 06/28/2017 03/21/2017 10/07/2015  Decreased Interest 0 0 0 0 0 0 0  Down, Depressed, Hopeless 0 0 0 0 1 1 1   PHQ - 2 Score 0 0 0 0 1 1 1   Altered sleeping 0 - - - - - -  Tired, decreased energy 0 - - - - - -  Change in appetite - - - - - - -  Feeling bad or failure about yourself  0 - - - - - -  Trouble concentrating 0 - - - - - -  Moving slowly or fidgety/restless 0 - - - - - -  Suicidal thoughts 0 - - - - - -  PHQ-9 Score 0 - - - - - -  Difficult doing work/chores - - - - - - -   Vision:up to date  Dental:up to date  Immunizations: (TDAP, Hep C screen, Pneumovax, Influenza, zoster)  Health Maintenance  Topic Date Due  . Eye exam for diabetics  02/07/2018  . Hemoglobin A1C  07/18/2018  . Tetanus Vaccine  01/17/2019*  . Complete foot exam   11/28/2019  . Mammogram  12/05/2019  . Pneumonia vaccines (2 of 2 - PPSV23) 01/12/2020  . Cologuard (Stool DNA test)  09/01/2020  . Flu Shot  Completed  . DEXA scan (bone density measurement)  Completed  .  Hepatitis C: One time screening is recommended  by Center for Disease Control  (CDC) for  adults born from [birth year] through 1964.   Completed  *Topic was postponed. The date shown is not the original due date.   Diet:regular  Weight:  Wt Readings from Last 3 Encounters:  11/28/18 (!) 357 lb (161.9 kg)  11/06/18 (!) 352 lb 12.8 oz (160 kg)  09/24/18 (!) 350 lb (158.8 kg)    Exercise:none at this time  Fall Risk: Fall Risk  11/28/2018 07/04/2018 03/27/2018 06/28/2017 03/21/2017 11/18/2015 10/07/2015  Falls in the past year? 0 0 0 No No No No  Risk for fall due to : - - - - - - -  Risk for fall due to: Comment - - - - - - -   Advanced Directive: Advanced Directives 07/04/2018  Does Patient Have a Medical Advance Directive? No   Would patient like information on creating a medical advance directive? No - Patient declined     Medications and allergies reviewed with patient and updated if appropriate.  Patient Active Problem List   Diagnosis Date Noted  . Polyneuropathy associated with underlying disease ([place]) 11/28/2018  . Severe episode of recurrent major depressive disorder, without psychotic features ([place]) 11/28/2018  . Type 2 diabetes mellitus with diabetic peripheral angiopathy without gangrene, without long-term current use of insulin ([place]) 11/28/2018  . Poor short term memory 11/28/2018  . Mild aortic stenosis by prior echocardiogram 11/06/2018  . CKD (chronic kidney disease) stage 3, GFR 30-59 ml/min 03/17/2016  . Symptomatic anemia 03/17/2016  . S/P total knee arthroplasty, right 03/17/2016  . Unilateral primary osteoarthritis, right knee   . Hyperlipidemia LDL goal <70 12/15/2015  . Pain in both lower extremities 11/19/2015  . Numbness in both hands 08/13/2015  . Panic attack 06/09/2015  . Right knee pain 05/14/2015  . Chronic renal insufficiency 09/16/2014  . Pneumothorax on right   . Atypical mycobacterium infection   . ILD (interstitial lung disease) 2/2 MAI s/p med rxn   . Routine general medical examination at a health care facility 04/03/2014  . Morbid obesity ([place]) 04/03/2014  . OSA (obstructive sleep apnea) 02/25/2014  . Hypertensive heart disease with chronic diastolic congestive heart failure ([place]) 02/25/2014  . DOE (dyspnea on exertion) 02/25/2014  . DM (diabetes mellitus) ([place]) 02/25/2014  . Essential hypertension 02/25/2014    Current Outpatient Medications on File Prior to Visit  Medication Sig Dispense Refill  . acetaminophen (TYLENOL) 500 MG tablet Take 1 tablet (500 mg total) by mouth every 6 (six) hours as needed. 30 tablet 0  . albuterol (PROAIR HFA) 108 (90 Base) MCG/ACT inhaler Inhale 2 puffs into the lungs every 6 (six) hours as needed for wheezing or shortness of breath.  1 Inhaler 3  . aspirin 81 MG tablet Take 1 tablet (81 mg total) by mouth daily with breakfast. 90 tablet 0  . atorvastatin (LIPITOR) 20 MG tablet Take 1 tablet (20 mg total) by mouth daily at 6 PM. 90 tablet 3  . Calcium Carbonate-Vitamin D (CALCIUM 600+D) 600-400 MG-UNIT tablet Take 1 tablet by mouth daily.    . carvedilol (COREG) 25 MG tablet Take 1 tablet (25 mg total) by mouth 2 (two) times daily. 180 tablet 3  . cholecalciferol (VITAMIN D) 1000 units tablet Take 1,000 Units by mouth daily.    . Clobetasol Prop Emollient Base (CLOBETASOL PROPIONATE E) 0.05 % emollient cream Apply 1 application topically daily as needed (skin irritations). 30 g 2  . ferrous sulfate (FEROSUL) 325 (65 FE) MG tablet  Take 325 mg by mouth daily with breakfast.    . furosemide (LASIX) 40 MG tablet TAKE 1 TAB DAILY, TAKE AN ADDITIONAL TAB AT 2 PM IF INCREASED SWELLING AND/OR SHORTNESS OF BREATH 180 tablet 1  . lisinopril (ZESTRIL) 20 MG tablet TAKE 1 TABLET BY MOUTH EVERY DAY 90 tablet 1  . mometasone (NASONEX) 50 MCG/ACT nasal spray Place 2 sprays into the nose daily. 17 g 11  . montelukast (SINGULAIR) 10 MG tablet Take 1 tablet (10 mg total) by mouth at bedtime. 90 tablet 3  . sitaGLIPtin (JANUVIA) 100 MG tablet Take 1 tablet (100 mg total) by mouth daily. 90 tablet 3  . vitamin C (ASCORBIC ACID) 500 MG tablet Take 500 mg by mouth daily.      No current facility-administered medications on file prior to visit.     Past Medical History:  Diagnosis Date  . Anxiety    doesn't take any meds  . Arthritis of right knee 03/14/2016  . Asthma   . Chronic diastolic CHF (congestive heart failure) (HCC)    HF with Preserved EF (60-65%) - Grade II Diastolic Dysfunction (Hypertensive Heart Disease). takes Furosemide daily  . COPD (chronic obstructive pulmonary disease) (HCC)    Albuterol as needed  . Depression    doesn't take meds  . Diabetes ([place])    takes Januvia daily  . Eczema    uses cream as needed  . GERD  (gastroesophageal reflux disease)   . History of bronchitis as a child   . HTN (hypertension)   . Insomnia   . Mild aortic stenosis by prior echocardiogram 04/2017   Mild stenosis: Mean gradient 15 mmHg, peak gradient 28 mmHg  . OA (osteoarthritis)   . OSA (obstructive sleep apnea) 02/25/2014   wears CPAP at night  . Peripheral neuropathy    takes Gabapentin as needed  . Pneumonia    hx of-2010  . Seasonal allergies    uses Flonase daily    Past Surgical History:  Procedure Laterality Date  . CESAREAN SECTION  x2  . COLONOSCOPY    . CORONARY CALCIUM SCORE & CT ANGIOGRAM  09/2017   Coronary Ca Score = 11 (low).  CTA- no obstructive CAD (minimal disease)  . JOINT REPLACEMENT    . KNEE ARTHROSCOPY Right   . LUNG BIOPSY Right 06/10/2014   Procedure: LUNG BIOPSY;  Surgeon: Ivin Poot, MD;  Location: [facility];  Service: Thoracic;  Laterality: Right;  . POLYPECTOMY     throat  . TOTAL KNEE ARTHROPLASTY Right 03/14/2016   Procedure: RIGHT TOTAL KNEE ARTHROPLASTY;  Surgeon: Marybelle Killings, MD;  Location: [facility];  Service: Orthopedics;  Laterality: Right;  . TRANSTHORACIC ECHOCARDIOGRAM  03/2017    EF 60-65% moderate concentric LVH GRII DD.  Mild aortic stenosis (mean gradient 15 mmHg, peak gradient 28 mmHg).  . VIDEO ASSISTED THORACOSCOPY Right 06/10/2014   Procedure: VIDEO ASSISTED THORACOSCOPY;  Surgeon: Ivin Poot, MD;  Location: [facility] OR;  Service: Thoracic;  Laterality: Right;    Social History   Socioeconomic History  . Marital status: Widowed    Spouse name: Not on file  . Number of children: Not on file  . Years of education: Not on file  . Highest education level: Not on file  Occupational History  . Occupation: disabled  Social Needs  . Financial resource strain: Not on file  . Food insecurity    Worry: Not on file    Inability: Not on file  .  Transportation needs    Medical: Not on file    Non-medical: Not on file  Tobacco Use  . Smoking status: Former Smoker     Packs/day: 0.25    Years: 15.00    Pack years: 3.75    Types: Cigarettes    Quit date: 02/08/1980    Years since quitting: 38.8  . Smokeless tobacco: Former Systems developer  . Tobacco comment: 2 packs per week  Substance and Sexual Activity  . Alcohol use: Yes    Alcohol/week: 0.0 standard drinks    Comment: occassional/social/rare  . Drug use: No  . Sexual activity: Not Currently  Lifestyle  . Physical activity    Days per week: Not on file    Minutes per session: Not on file  . Stress: Not on file  Relationships  . Social Herbalist on phone: Not on file    Gets together: Not on file    Attends religious service: Not on file    Active member of club or organization: Not on file    Attends meetings of clubs or organizations: Not on file    Relationship status: Not on file  Other Topics Concern  . Not on file  Social History Narrative  . Not on file    Family History  Problem Relation Age of Onset  . COPD Father   . Hypothyroidism Father   . Anemia Father        iron deficiency  . Cancer Mother 77       pancreatic        Review of Systems  Constitutional: Negative for fever, malaise/fatigue and weight loss.  HENT: Negative for congestion and sore throat.   Eyes:       Negative for visual changes  Respiratory: Positive for shortness of breath. Negative for cough and sputum production.   Cardiovascular: Positive for leg swelling. Negative for chest pain, palpitations, orthopnea and PND.  Gastrointestinal: Negative.  Negative for blood in stool, constipation, diarrhea and heartburn.  Genitourinary: Negative.  Negative for dysuria, frequency and urgency.  Musculoskeletal: Positive for joint pain. Negative for back pain, falls, myalgias and neck pain.  Skin: Negative for rash.  Neurological: Positive for sensory change. Negative for dizziness, focal weakness, weakness and headaches.  Endo/Heme/Allergies: Does not bruise/bleed easily.  Psychiatric/Behavioral: Negative  for depression, substance abuse and suicidal ideas. The patient is not nervous/anxious and does not have insomnia.     Objective:   Vitals:   11/28/18 0948  BP: (!) 154/94  Pulse: (!) 56  Temp: (!) 97.4 F (36.3 C)  SpO2: 94%    Body mass index is 54.28 kg/m.   Physical Examination:  Physical Exam Constitutional:      Appearance: She is obese.  HENT:     Head: Normocephalic.  Eyes:     Extraocular Movements: Extraocular movements intact.     Conjunctiva/sclera: Conjunctivae normal.  Neck:     Musculoskeletal: Normal range of motion and neck supple.  Cardiovascular:     Rate and Rhythm: Normal rate and regular rhythm.     Pulses: Normal pulses.     Heart sounds: Murmur present.  Pulmonary:     Effort: Pulmonary effort is normal.     Breath sounds: Normal breath sounds.  Abdominal:     General: Bowel sounds are normal.     Palpations: Abdomen is soft.  Musculoskeletal:     Right lower leg: Edema present.     Left lower leg: Edema  present.  Lymphadenopathy:     Cervical: No cervical adenopathy.  Skin:    Findings: No erythema or rash.  Neurological:     Mental Status: She is alert and oriented to person, place, and time.  Psychiatric:        Mood and Affect: Mood normal.        Behavior: Behavior normal.        Thought Content: Thought content normal.     ASSESSMENT and PLAN:  Stacey Page was seen today for follow-up.  Diagnoses and all orders for this visit:  Type 2 diabetes mellitus with diabetic polyneuropathy, without long-term current use of insulin (HCC) -     Hemoglobin A1c -     Basic metabolic panel -     Hepatic function panel -     Amb Ref to Medical Weight Management  Stage 3a chronic kidney disease -     Basic metabolic panel  Hypertensive heart disease with chronic diastolic congestive heart failure (HCC) -     TSH -     Basic metabolic panel -     Amb Ref to Medical Weight Management  Hyperlipidemia LDL goal <70 -     Lipid panel  -     Hepatic function panel -     Amb Ref to Medical Weight Management  Morbid obesity ([place]) -     Amb Ref to Medical Weight Management  Impacted cerumen of right ear -     carbamide peroxide (DEBROX) 6.5 % OTIC solution; Place 5 drops into the right ear 2 (two) times daily.  Poor short term memory -     RPR -     B12  Need for influenza vaccination -     Flu Vaccine QUAD High Dose(Fluad)  Polyneuropathy associated with underlying disease ([place])  Recurrent major depressive disorder, in full remission ([place])   Poor short term memory Today she also reports difficulty with short term memory: forgets words, walks in and out of a room and forgets why she is there, losing car keys. She denies any long term memory loss or difficulty recognizing family or getting lost when driving. She will like to know if Provigil will be beneficial?  Normal MMSE 30/30 Check TSH, B12, and RPR. Consider referral to neurology if symptoms worsen  Morbid obesity ([place]) She has difficulty with DASH diet and daily exercise. She had appts with nutritionist but had difficulty following their suggestions. She is unable to maintain regular exercise regiment due to knee pain and SOB with exertion. She does recognize the health impacts of her weight. She is requesting for referral to weight loss clinic. Referral entered Wt Readings from Last 3 Encounters:  11/28/18 (!) 357 lb (161.9 kg)  11/06/18 (!) 352 lb 12.8 oz (160 kg)  09/24/18 (!) 350 lb (158.8 kg)    DM (diabetes mellitus) ([place]) Stable with use of januvia. Last hgbA1c of 6.2 Normal urine microalbumin 01/2018, use of ACE-I Up to date with dental cleaning (upcoming root canal) and eye exam (by [facility]) Chronic LE neuropathy. LDL at goal with lipitor.  Repeat hgbA1c,and lipid panel Advised about foot care. F/up in 24months     Problem List Items Addressed This Visit      Cardiovascular and Mediastinum   Hypertensive heart disease with  chronic diastolic congestive heart failure (HCC) (Chronic)   Relevant Orders   TSH   Basic metabolic panel   Amb Ref to Medical Weight Management     Endocrine  DM (diabetes mellitus) ([place]) - Primary    Stable with use of januvia. Last hgbA1c of 6.2 Normal urine microalbumin 01/2018, use of ACE-I Up to date with dental cleaning (upcoming root canal) and eye exam (by [facility]) Chronic LE neuropathy. LDL at goal with lipitor.  Repeat hgbA1c,and lipid panel Advised about foot care. F/up in 53months      Relevant Orders   Hemoglobin Z9D   Basic metabolic panel   Hepatic function panel   Amb Ref to Medical Weight Management     Nervous and Auditory   Polyneuropathy associated with underlying disease ([place])    Due to DM-type 2, normal peripheral pulse, no claudication, skin normal and intact. Counseling about need for foot wear at all times and daily foot checks. Denies need for gabapentin or elavil at this time        Genitourinary   CKD (chronic kidney disease) stage 3, GFR 30-59 ml/min   Relevant Orders   Basic metabolic panel     Other   Hyperlipidemia LDL goal <70 (Chronic)   Relevant Orders   Lipid panel   Hepatic function panel   Amb Ref to Medical Weight Management   Morbid obesity ([place]) (Chronic)    She has difficulty with DASH diet and daily exercise. She had appts with nutritionist but had difficulty following their suggestions. She is unable to maintain regular exercise regiment due to knee pain and SOB with exertion. She does recognize the health impacts of her weight. She is requesting for referral to weight loss clinic. Referral entered Wt Readings from Last 3 Encounters:  11/28/18 (!) 357 lb (161.9 kg)  11/06/18 (!) 352 lb 12.8 oz (160 kg)  09/24/18 (!) 350 lb (158.8 kg)        Relevant Orders   Amb Ref to Medical Weight Management   Poor short term memory    Today she also reports difficulty with short term memory: forgets words, walks in and  out of a room and forgets why she is there, losing car keys. She denies any long term memory loss or difficulty recognizing family or getting lost when driving. She will like to know if Provigil will be beneficial?  Normal MMSE 30/30 Check TSH, B12, and RPR. Consider referral to neurology if symptoms worsen      Relevant Orders   RPR   B12   Severe episode of recurrent major depressive disorder, without psychotic features ([place])    Stable mood Denies need for medication used at this time Denies any SI or HI       Other Visit Diagnoses    Impacted cerumen of right ear       Relevant Medications   carbamide peroxide (DEBROX) 6.5 % OTIC solution   Need for influenza vaccination       Relevant Orders   Flu Vaccine QUAD High Dose(Fluad) (Completed)      Follow up: Return in about 3 months (around 02/28/2019) for DM and HTN, hyperlipidemia.  Wilfred Lacy, NP

## 2018-11-28 NOTE — Assessment & Plan Note (Signed)
Stable with use of januvia. Last hgbA1c of 6.2 Normal urine microalbumin 01/2018, use of ACE-I Up to date with dental cleaning (upcoming root canal) and eye exam (by [facility]) Chronic LE neuropathy. LDL at goal with lipitor.  Repeat hgbA1c,and lipid panel Advised about foot care. F/up in 84months

## 2018-11-28 NOTE — Assessment & Plan Note (Signed)
Stable mood Denies need for medication used at this time Denies any SI or HI

## 2018-11-28 NOTE — Assessment & Plan Note (Signed)
Due to DM-type 2, normal peripheral pulse, no claudication, skin normal and intact. Counseling about need for foot wear at all times and daily foot checks. Denies need for gabapentin or elavil at this time

## 2018-11-28 NOTE — Assessment & Plan Note (Signed)
Today she also reports difficulty with short term memory: forgets words, walks in and out of a room and forgets why she is there, losing car keys. She denies any long term memory loss or difficulty recognizing family or getting lost when driving. She will like to know if Provigil will be beneficial?  Normal MMSE 30/30 Check TSH, B12, and RPR. Consider referral to neurology if symptoms worsen

## 2018-11-29 ENCOUNTER — Telehealth: Payer: Self-pay | Admitting: Nurse Practitioner

## 2018-11-29 LAB — RPR: RPR Ser Ql: NONREACTIVE

## 2018-11-29 NOTE — Telephone Encounter (Signed)

## 2018-11-30 ENCOUNTER — Ambulatory Visit (INDEPENDENT_AMBULATORY_CARE_PROVIDER_SITE_OTHER): Payer: PPO | Admitting: Nurse Practitioner

## 2018-11-30 ENCOUNTER — Other Ambulatory Visit: Payer: Self-pay

## 2018-11-30 ENCOUNTER — Encounter: Payer: Self-pay | Admitting: Nurse Practitioner

## 2018-11-30 VITALS — BP 146/90 | HR 66 | Temp 97.4°F | Ht 68.0 in

## 2018-11-30 DIAGNOSIS — J309 Allergic rhinitis, unspecified: Secondary | ICD-10-CM

## 2018-11-30 DIAGNOSIS — E785 Hyperlipidemia, unspecified: Secondary | ICD-10-CM | POA: Diagnosis not present

## 2018-11-30 DIAGNOSIS — I10 Essential (primary) hypertension: Secondary | ICD-10-CM

## 2018-11-30 DIAGNOSIS — H6123 Impacted cerumen, bilateral: Secondary | ICD-10-CM

## 2018-11-30 DIAGNOSIS — E1141 Type 2 diabetes mellitus with diabetic mononeuropathy: Secondary | ICD-10-CM | POA: Diagnosis not present

## 2018-11-30 MED ORDER — MONTELUKAST SODIUM 10 MG PO TABS: 10.0000 mg | ORAL_TABLET | Freq: Every day | ORAL | 3 refills | Status: DC

## 2018-11-30 MED ORDER — LISINOPRIL 20 MG PO TABS: 20.0000 mg | ORAL_TABLET | Freq: Every day | ORAL | 3 refills | Status: DC

## 2018-11-30 MED ORDER — SITAGLIPTIN PHOSPHATE 100 MG PO TABS: 100.0000 mg | ORAL_TABLET | Freq: Every day | ORAL | 3 refills | Status: DC

## 2018-11-30 MED ORDER — ATORVASTATIN CALCIUM 20 MG PO TABS: 20.0000 mg | ORAL_TABLET | Freq: Every day | ORAL | 3 refills | Status: DC

## 2018-11-30 NOTE — Patient Instructions (Signed)
Informed of labe results during [place] today 11/30/18  Stable lab results with negative RPR and normal B12.  Continue current medication.

## 2018-11-30 NOTE — Progress Notes (Signed)
Subjective:  Patient ID: Stacey Page, female    DOB: [birth year]  Age: 68 y.o. MRN: [id number]  CC: Ear Fullness (ear clearing today)  Ear Fullness  There is pain in both ears. This is a new problem. The current episode started 1 to 4 weeks ago. The problem occurs constantly. The problem has been unchanged. There has been no fever. The pain is at a severity of 0/10. The patient is experiencing no pain. Associated symptoms include hearing loss. Pertinent negatives include no ear discharge, headaches, rhinorrhea or sore throat. She has tried ear drops for the symptoms. The treatment provided no relief. There is no history of a chronic ear infection, hearing loss or a tympanostomy tube.   Reviewed past Medical, Social and Family history today.  Outpatient Medications Prior to Visit  Medication Sig Dispense Refill  . acetaminophen (TYLENOL) 500 MG tablet Take 1 tablet (500 mg total) by mouth every 6 (six) hours as needed. 30 tablet 0  . albuterol (PROAIR HFA) 108 (90 Base) MCG/ACT inhaler Inhale 2 puffs into the lungs every 6 (six) hours as needed for wheezing or shortness of breath. 1 Inhaler 3  . aspirin 81 MG tablet Take 1 tablet (81 mg total) by mouth daily with breakfast. 90 tablet 0  . Calcium Carbonate-Vitamin D (CALCIUM 600+D) 600-400 MG-UNIT tablet Take 1 tablet by mouth daily.    . carvedilol (COREG) 25 MG tablet Take 1 tablet (25 mg total) by mouth 2 (two) times daily. 180 tablet 3  . cholecalciferol (VITAMIN D) 1000 units tablet Take 1,000 Units by mouth daily.    . Clobetasol Prop Emollient Base (CLOBETASOL PROPIONATE E) 0.05 % emollient cream Apply 1 application topically daily as needed (skin irritations). 30 g 2  . ferrous sulfate (FEROSUL) 325 (65 FE) MG tablet Take 325 mg by mouth daily with breakfast.    . furosemide (LASIX) 40 MG tablet TAKE 1 TAB DAILY, TAKE AN ADDITIONAL TAB AT 2 PM IF INCREASED SWELLING AND/OR SHORTNESS OF BREATH 180 tablet 1  . mometasone (NASONEX) 50  MCG/ACT nasal spray Place 2 sprays into the nose daily. 17 g 11  . vitamin C (ASCORBIC ACID) 500 MG tablet Take 500 mg by mouth daily.     Marland Kitchen atorvastatin (LIPITOR) 20 MG tablet Take 1 tablet (20 mg total) by mouth daily at 6 PM. 90 tablet 3  . carbamide peroxide (DEBROX) 6.5 % OTIC solution Place 5 drops into the right ear 2 (two) times daily. 15 mL 0  . lisinopril (ZESTRIL) 20 MG tablet TAKE 1 TABLET BY MOUTH EVERY DAY 90 tablet 1  . montelukast (SINGULAIR) 10 MG tablet Take 1 tablet (10 mg total) by mouth at bedtime. 90 tablet 3  . sitaGLIPtin (JANUVIA) 100 MG tablet Take 1 tablet (100 mg total) by mouth daily. 90 tablet 3   No facility-administered medications prior to visit.     ROS See HPI  Procedure Note :     Procedure :  Ear irrigation (bilateral)   Indication:  Cerumen impaction (bilateral)   Risks, including pain, dizziness, eardrum perforation, bleeding, infection and others as well as benefits were explained to the patient in detail. Verbal consent was obtained and the patient agreed to proceed.   We used "The Elephant Ear Irrigation Device" filled with lukewarm water for irrigation. A large amount wax was recovered. Procedure di not required manual wax removal with an ear loop.  Tolerated well. Complications: None.   Postprocedure instructions :  Call if problems.  Objective:  BP (!) 146/90   Pulse 66   Temp (!) 97.4 F (36.3 C) (Tympanic)   Ht 5\' 8"  (1.727 m)   SpO2 96%   BMI 54.28 kg/m   BP Readings from Last 3 Encounters:  11/30/18 (!) 146/90  11/28/18 (!) 154/94  11/06/18 135/65    Wt Readings from Last 3 Encounters:  11/28/18 (!) 357 lb (161.9 kg)  11/06/18 (!) 352 lb 12.8 oz (160 kg)  09/24/18 (!) 350 lb (158.8 kg)    Physical Exam Vitals signs reviewed.  HENT:     Right Ear: Tympanic membrane, ear canal and external ear normal. There is impacted cerumen.     Left Ear: Tympanic membrane, ear canal and external ear normal. There is impacted  cerumen.  Neck:     Musculoskeletal: Normal range of motion and neck supple.  Lymphadenopathy:     Cervical: No cervical adenopathy.  Neurological:     Mental Status: She is alert.    Lab Results  Component Value Date   WBC 5.6 01/16/2018   HGB 13.0 01/16/2018   HCT 40.6 01/16/2018   PLT 235.0 01/16/2018   GLUCOSE 93 11/28/2018   CHOL 132 11/28/2018   TRIG 78.0 11/28/2018   HDL 52.60 11/28/2018   LDLCALC 64 11/28/2018   ALT 11 11/28/2018   AST 14 11/28/2018   NA 138 11/28/2018   K 4.6 11/28/2018   CL 103 11/28/2018   CREATININE 1.18 11/28/2018   BUN 18 11/28/2018   CO2 28 11/28/2018   TSH 0.51 11/28/2018   INR 1.11 06/16/2014   HGBA1C 6.1 11/28/2018   MICROALBUR 1.9 01/16/2018   Assessment & Plan:   Stacey Page was seen today for ear fullness.  Diagnoses and all orders for this visit:  Bilateral hearing loss due to cerumen impaction  Hyperlipidemia LDL goal <70 -     atorvastatin (LIPITOR) 20 MG tablet; Take 1 tablet (20 mg total) by mouth daily at 6 PM.  Essential hypertension -     lisinopril (ZESTRIL) 20 MG tablet; Take 1 tablet (20 mg total) by mouth daily.  Type 2 diabetes mellitus with diabetic mononeuropathy, without long-term current use of insulin (HCC) -     sitaGLIPtin (JANUVIA) 100 MG tablet; Take 1 tablet (100 mg total) by mouth daily.  Allergic rhinitis, unspecified seasonality, unspecified trigger -     montelukast (SINGULAIR) 10 MG tablet; Take 1 tablet (10 mg total) by mouth at bedtime.   I have discontinued Stacey Page's Debrox. I have also changed her lisinopril. Additionally, I am having her maintain her Clobetasol Prop Emollient Base, albuterol, aspirin EC, mometasone, Calcium Carbonate-Vitamin D, ferrous sulfate, cholecalciferol, vitamin C, furosemide, acetaminophen, carvedilol, atorvastatin, sitaGLIPtin, and montelukast.  Meds ordered this encounter  Medications  . atorvastatin (LIPITOR) 20 MG tablet    Sig: Take 1 tablet (20 mg  total) by mouth daily at 6 PM.    Dispense:  90 tablet    Refill:  3    Order Specific Question:   Supervising Provider    Answer:   MATTHEWS, CODY [[id number]]  . lisinopril (ZESTRIL) 20 MG tablet    Sig: Take 1 tablet (20 mg total) by mouth daily.    Dispense:  90 tablet    Refill:  3    Order Specific Question:   Supervising Provider    Answer:   MATTHEWS, CODY [[id number]]  . sitaGLIPtin (JANUVIA) 100 MG tablet    Sig: Take 1 tablet (100 mg total) by mouth daily.  Dispense:  90 tablet    Refill:  3    Order Specific Question:   Supervising Provider    Answer:   MATTHEWS, CODY [[id number]]  . montelukast (SINGULAIR) 10 MG tablet    Sig: Take 1 tablet (10 mg total) by mouth at bedtime.    Dispense:  90 tablet    Refill:  3    Order Specific Question:   Supervising Provider    Answer:   MATTHEWS, CODY [[id number]]    Problem List Items Addressed This Visit      Cardiovascular and Mediastinum   Essential hypertension (Chronic)   Relevant Medications   atorvastatin (LIPITOR) 20 MG tablet   lisinopril (ZESTRIL) 20 MG tablet     Endocrine   DM (diabetes mellitus) ([place])   Relevant Medications   atorvastatin (LIPITOR) 20 MG tablet   lisinopril (ZESTRIL) 20 MG tablet   sitaGLIPtin (JANUVIA) 100 MG tablet     Other   Hyperlipidemia LDL goal <70 (Chronic)   Relevant Medications   atorvastatin (LIPITOR) 20 MG tablet   lisinopril (ZESTRIL) 20 MG tablet    Other Visit Diagnoses    Bilateral hearing loss due to cerumen impaction    -  Primary   Allergic rhinitis, unspecified seasonality, unspecified trigger       Relevant Medications   montelukast (SINGULAIR) 10 MG tablet      Follow-up: Return if symptoms worsen or fail to improve.  Wilfred Lacy, NP

## 2018-12-03 ENCOUNTER — Encounter: Payer: Self-pay | Admitting: Nurse Practitioner

## 2018-12-12 ENCOUNTER — Encounter: Payer: Self-pay | Admitting: Nurse Practitioner

## 2018-12-12 NOTE — Progress Notes (Signed)
Abstracted result and sent to scan  

## 2018-12-21 NOTE — Progress Notes (Signed)
[id number] [address], [place] [address] Phone: [phone] Fax: [phone]      Your procedure is scheduled on November 20  Report to [facility] Main Entrance "A" at 0800 A.M., and check in at the Admitting office.  Call this number if you have problems the morning of surgery:  [phone]  Call [phone] if you have any questions prior to your surgery date Monday-Friday 8am-4pm    Remember:  Do not eat or drink after midnight the night before your surgery    Take these medicines the morning of surgery with A SIP OF WATER  acetaminophen (TYLENOL if needed albuterol (PROAIR HFA), Please bring all inhalers with you the day of surgery.  carvedilol (COREG)  mometasone (NASONEX) If needed  Follow your surgeon's instructions on when to stop Aspirin.  If no instructions were given by your surgeon then you will need to call the office to get those instructions.    7 days prior to surgery STOP taking any Aleve, Naproxen, Ibuprofen, Motrin, Advil, Goody's, BC's, all herbal medications, fish oil, and all vitamins.    WHAT DO I DO ABOUT MY DIABETES MEDICATION?    Marland Kitchen Do not take oral diabetes medicines (pills) the morning of surgery. sitaGLIPtin (JANUVIA   How to Manage Your Diabetes Before and After Surgery  Why is it important to control my blood sugar before and after surgery? . Improving blood sugar levels before and after surgery helps healing and can limit problems. . A way of improving blood sugar control is eating a healthy diet by: o  Eating less sugar and carbohydrates o  Increasing activity/exercise o  Talking with your doctor about reaching your blood sugar goals . High blood sugars (greater than 180 mg/dL) can raise your risk of infections and slow your recovery, so you will need to focus on controlling your diabetes during the weeks before surgery. . Make sure that the doctor who takes care of your  diabetes knows about your planned surgery including the date and location.  How do I manage my blood sugar before surgery? . Check your blood sugar at least 4 times a day, starting 2 days before surgery, to make sure that the level is not too high or low. o Check your blood sugar the morning of your surgery when you wake up and every 2 hours until you get to the Short Stay unit. . If your blood sugar is less than 70 mg/dL, you will need to treat for low blood sugar: o Do not take insulin. o Treat a low blood sugar (less than 70 mg/dL) with  cup of clear juice (cranberry or apple), 4 glucose tablets, OR glucose gel. o Recheck blood sugar in 15 minutes after treatment (to make sure it is greater than 70 mg/dL). If your blood sugar is not greater than 70 mg/dL on recheck, call [phone] for further instructions. . Report your blood sugar to the short stay nurse when you get to Short Stay.  . If you are admitted to the hospital after surgery: o Your blood sugar will be checked by the staff and you will probably be given insulin after surgery (instead of oral diabetes medicines) to make sure you have good blood sugar levels. o The goal for blood sugar control after surgery is 80-180 mg/dL.  The Morning of Surgery  Do not wear jewelry, make-up or nail polish.  Do not wear lotions, powders, or perfumes/colognes, or deodorant  Do not shave 48 hours prior to surgery.  Men may shave face and neck.  Do not bring valuables to the hospital.  [facility] is not responsible for any belongings or valuables.  If you are a smoker, DO NOT Smoke 24 hours prior to surgery IF you wear a CPAP at night please bring your mask, tubing, and machine the morning of surgery   Remember that you must have someone to transport you home after your surgery, and remain with you for 24 hours if you are discharged the same day.   Contacts, glasses, hearing aids, dentures or bridgework may not be worn into surgery.     Leave your suitcase in the car.  After surgery it may be brought to your room.  For patients admitted to the hospital, discharge time will be determined by your treatment team.  Patients discharged the day of surgery will not be allowed to drive home.    Special instructions:   [place]- Preparing For Surgery  Before surgery, you can play an important role. Because skin is not sterile, your skin needs to be as free of germs as possible. You can reduce the number of germs on your skin by washing with CHG (chlorahexidine gluconate) Soap before surgery.  CHG is an antiseptic cleaner which kills germs and bonds with the skin to continue killing germs even after washing.    Oral Hygiene is also important to reduce your risk of infection.  Remember - BRUSH YOUR TEETH THE MORNING OF SURGERY WITH YOUR REGULAR TOOTHPASTE  Please do not use if you have an allergy to CHG or antibacterial soaps. If your skin becomes reddened/irritated stop using the CHG.  Do not shave (including legs and underarms) for at least 48 hours prior to first CHG shower. It is OK to shave your face.  Please follow these instructions carefully.   1. Shower the NIGHT BEFORE SURGERY and the MORNING OF SURGERY with CHG Soap.   2. If you chose to wash your hair, wash your hair first as usual with your normal shampoo.  3. After you shampoo, rinse your hair and body thoroughly to remove the shampoo.  4. Use CHG as you would any other liquid soap. You can apply CHG directly to the skin and wash gently with a scrungie or a clean washcloth.   5. Apply the CHG Soap to your body ONLY FROM THE NECK DOWN.  Do not use on open wounds or open sores. Avoid contact with your eyes, ears, mouth and genitals (private parts). Wash Face and genitals (private parts)  with your normal soap.   6. Wash thoroughly, paying special attention to the area where your surgery will be performed.  7. Thoroughly rinse your body with warm water from  the neck down.  8. DO NOT shower/wash with your normal soap after using and rinsing off the CHG Soap.  9. Pat yourself dry with a CLEAN TOWEL.  10. Wear CLEAN PAJAMAS to bed the night before surgery, wear comfortable clothes the morning of surgery  11. Place CLEAN SHEETS on your bed the night of your first shower and DO NOT SLEEP WITH PETS.    Day of Surgery:  Do not apply any deodorants/lotions. Please shower the morning of surgery with the CHG soap  Please wear clean clothes to the hospital/surgery center.   Remember to brush your teeth WITH YOUR REGULAR TOOTHPASTE.   Please read over the following fact sheets that you were given.

## 2018-12-24 ENCOUNTER — Encounter (HOSPITAL_COMMUNITY): Payer: Self-pay

## 2018-12-24 ENCOUNTER — Encounter (HOSPITAL_COMMUNITY)
Admission: RE | Admit: 2018-12-24 | Discharge: 2018-12-24 | Disposition: A | Discharge status: Home / Self Care | Payer: PPO | Source: Ambulatory Visit | Attending: General Surgery | Admitting: General Surgery

## 2018-12-24 ENCOUNTER — Other Ambulatory Visit: Payer: Self-pay

## 2018-12-24 DIAGNOSIS — G4733 Obstructive sleep apnea (adult) (pediatric): Secondary | ICD-10-CM | POA: Diagnosis not present

## 2018-12-24 DIAGNOSIS — I350 Nonrheumatic aortic (valve) stenosis: Secondary | ICD-10-CM | POA: Diagnosis not present

## 2018-12-24 DIAGNOSIS — Z01812 Encounter for preprocedural laboratory examination: Secondary | ICD-10-CM | POA: Diagnosis not present

## 2018-12-24 DIAGNOSIS — I10 Essential (primary) hypertension: Secondary | ICD-10-CM | POA: Diagnosis not present

## 2018-12-24 LAB — BASIC METABOLIC PANEL
Anion gap: 9 (ref 5–15)
BUN: 24 mg/dL — ABNORMAL HIGH (ref 8–23)
CO2: 25 mmol/L (ref 22–32)
Calcium: 9.5 mg/dL (ref 8.9–10.3)
Chloride: 106 mmol/L (ref 98–111)
Creatinine, Ser: 1.57 mg/dL — ABNORMAL HIGH (ref 0.44–1.00)
GFR calc Af Amer: 39 mL/min — ABNORMAL LOW (ref >60)
GFR calc non Af Amer: 34 mL/min — ABNORMAL LOW (ref >60)
Glucose, Bld: 96 mg/dL (ref 70–99)
Potassium: 4 mmol/L (ref 3.5–5.1)
Sodium: 140 mmol/L (ref 135–145)

## 2018-12-24 LAB — CBC
HCT: 41.4 % (ref 36.0–46.0)
Hemoglobin: 12.7 g/dL (ref 12.0–15.0)
MCH: 29.4 pg (ref 26.0–34.0)
MCHC: 30.7 g/dL (ref 30.0–36.0)
MCV: 95.8 fL (ref 80.0–100.0)
Platelets: 213 10*3/uL (ref 150–400)
RBC: 4.32 MIL/uL (ref 3.87–5.11)
RDW: 14.6 % (ref 11.5–15.5)
WBC: 4.8 10*3/uL (ref 4.0–10.5)
nRBC: 0 % (ref 0.0–0.2)

## 2018-12-24 LAB — GLUCOSE, CAPILLARY: Glucose-Capillary: 140 mg/dL — ABNORMAL HIGH (ref 70–99)

## 2018-12-24 NOTE — Progress Notes (Signed)
PCP - Wilfred Lacy  Cardiologist - Dr. Ellyn Hack  PPM/ICD - N/A Device Orders -N/A  Rep Notified - N/A  Chest x-ray - N/A EKG - 11/06/18 Stress Test - denies ECHO - 2019 Cardiac Cath -denies   Sleep Study - OSA+ CPAP - uses nightly   Fasting Blood Sugar -does not check CBG at home nor has the materials to do so.   Blood Thinner Instructions: N/A Aspirin Instructions:Pt did not receive instructions, will call Dr. Ethlyn Gallery office for instructions   ERAS Protcol -N/A PRE-SURGERY Ensure or G2-   COVID TEST- Tuesday, 11/17.    Anesthesia review: Yes, cardiac history.   Patient denies shortness of breath, fever, cough and chest pain at PAT appointment   All instructions explained to the patient, with a verbal understanding of the material. Patient agrees to go over the instructions while at home for a better understanding. Patient also instructed to self quarantine after being tested for COVID-19. The opportunity to ask questions was provided.    Coronavirus Screening  Have you experienced the following symptoms:  Cough yes/no: No Fever (>100.27F)  yes/no: No Runny nose yes/no: No Sore throat yes/no: No Difficulty breathing/shortness of breath  yes/no: No  Have you or a family member traveled in the last 14 days and where? yes/no: No   If the patient indicates "YES" to the above questions, their PAT will be rescheduled to limit the exposure to others and, the surgeon will be notified. THE PATIENT WILL NEED TO BE ASYMPTOMATIC FOR 14 DAYS.   If the patient is not experiencing any of these symptoms, the PAT nurse will instruct them to NOT bring anyone with them to their appointment since they may have these symptoms or traveled as well.   Please remind your patients and families that hospital visitation restrictions are in effect and the importance of the restrictions.

## 2018-12-25 ENCOUNTER — Other Ambulatory Visit (HOSPITAL_COMMUNITY)
Admission: RE | Admit: 2018-12-25 | Discharge: 2018-12-25 | Disposition: A | Discharge status: Home / Self Care | Payer: PPO | Source: Ambulatory Visit | Attending: General Surgery | Admitting: General Surgery

## 2018-12-25 ENCOUNTER — Ambulatory Visit: Payer: Self-pay | Admitting: General Surgery

## 2018-12-25 DIAGNOSIS — Z01812 Encounter for preprocedural laboratory examination: Secondary | ICD-10-CM | POA: Insufficient documentation

## 2018-12-25 DIAGNOSIS — Z20828 Contact with and (suspected) exposure to other viral communicable diseases: Secondary | ICD-10-CM | POA: Insufficient documentation

## 2018-12-25 DIAGNOSIS — D241 Benign neoplasm of right breast: Secondary | ICD-10-CM

## 2018-12-25 LAB — SARS CORONAVIRUS 2 (TAT 6-24 HRS): SARS Coronavirus 2: NEGATIVE

## 2018-12-25 NOTE — Progress Notes (Signed)
Anesthesia Chart Review:  Follows yearly with cardiology for hx of HTN, HFpEF, OSA, Mild AS (mean grad 15 mmHg by echo 2019). Last seen by Dr. Ellyn Hack 11/06/18, overall stable at that time. Chronic DOE felt to be mostly related to OHS and deconditioning. Advised continued 9yr f/u.  EKG 11/06/18 (read per Dr. Allison Quarry note): Sinus rhythm, rate 75.  Prolonged QT for LVH with repolarization changes.  Lateral T wave inversions noted, but otherwise normal axis, intervals and durations.   Coronary CT 10/06/17: IMPRESSION: 1. Coronary artery calcium score 11 Agatston units, this places the patient in the 60th percentile for age and gender, suggesting intermediate risk for future cardiac events.  2.  No obstructive CAD.  TTE 03/31/17: - Left ventricle: The cavity size was normal. There was moderate   concentric hypertrophy. Systolic function was normal. The   estimated ejection fraction was in the range of 60% to 65%. Wall   motion was normal; there were no regional wall motion   abnormalities. Features are consistent with a pseudonormal left   ventricular filling pattern, with concomitant abnormal relaxation   and increased filling pressure (grade 2 diastolic dysfunction).   Doppler parameters are consistent with high ventricular filling   pressure. - Aortic valve: There was mild stenosis. Peak velocity (S): 265   cm/s. Mean gradient (S): 15 mm Hg. Peak gradient (S): 28 mm Hg.   Valve area (VTI): 2.27 cm^2. Valve area (Vmax): 1.81 cm^2. Valve   area (Vmean): 1.86 cm^2. - Mitral valve: Mildly calcified annulus. Transvalvular velocity   was within the normal range. There was no evidence for stenosis.   There was no regurgitation. - Right ventricle: The cavity size was normal. Wall thickness was   normal. Systolic function was normal. - Atrial septum: No defect or patent foramen ovale was identified. - Tricuspid valve: There was mild regurgitation. - Pulmonary arteries: Systolic pressure was  within the normal   range. PA peak pressure: 25 mm Hg (S). - Global longitudinal strain -21.2% (normal).  Wynonia Musty [facility] Short Stay Center/Anesthesiology Phone [phone] 12/25/2018 3:42 PM

## 2018-12-27 ENCOUNTER — Other Ambulatory Visit: Payer: Self-pay

## 2018-12-27 ENCOUNTER — Ambulatory Visit
Admission: RE | Admit: 2018-12-27 | Discharge: 2018-12-27 | Disposition: A | Discharge status: Home / Self Care | Payer: PPO | Source: Ambulatory Visit | Attending: General Surgery | Admitting: General Surgery

## 2018-12-27 DIAGNOSIS — R928 Other abnormal and inconclusive findings on diagnostic imaging of breast: Secondary | ICD-10-CM | POA: Diagnosis not present

## 2018-12-27 DIAGNOSIS — D241 Benign neoplasm of right breast: Secondary | ICD-10-CM

## 2018-12-27 MED ORDER — DEXTROSE 5 % IV SOLN
3.0000 g | INTRAVENOUS | Status: AC
  Administered 2018-12-28: 3 g via INTRAVENOUS
  Filled 2018-12-27: qty 3

## 2018-12-28 ENCOUNTER — Ambulatory Visit
Admission: RE | Admit: 2018-12-28 | Discharge: 2018-12-28 | Disposition: A | Discharge status: Home / Self Care | Payer: PPO | Source: Ambulatory Visit | Attending: General Surgery | Admitting: General Surgery

## 2018-12-28 ENCOUNTER — Ambulatory Visit (HOSPITAL_COMMUNITY)
Admission: RE | Admit: 2018-12-28 | Discharge: 2018-12-28 | Disposition: A | Discharge status: Home / Self Care | Payer: PPO | Attending: General Surgery | Admitting: General Surgery

## 2018-12-28 ENCOUNTER — Ambulatory Visit (HOSPITAL_COMMUNITY): Payer: PPO | Admitting: Anesthesiology

## 2018-12-28 ENCOUNTER — Ambulatory Visit (HOSPITAL_COMMUNITY): Payer: PPO | Admitting: Physician Assistant

## 2018-12-28 ENCOUNTER — Encounter (HOSPITAL_COMMUNITY): Payer: Self-pay

## 2018-12-28 ENCOUNTER — Encounter (HOSPITAL_COMMUNITY)
Admission: RE | Disposition: A | Discharge status: Home / Self Care | Payer: Self-pay | Source: Home / Self Care | Attending: General Surgery

## 2018-12-28 DIAGNOSIS — J449 Chronic obstructive pulmonary disease, unspecified: Secondary | ICD-10-CM | POA: Insufficient documentation

## 2018-12-28 DIAGNOSIS — N6011 Diffuse cystic mastopathy of right breast: Secondary | ICD-10-CM | POA: Diagnosis not present

## 2018-12-28 DIAGNOSIS — N6091 Unspecified benign mammary dysplasia of right breast: Secondary | ICD-10-CM | POA: Diagnosis not present

## 2018-12-28 DIAGNOSIS — E1151 Type 2 diabetes mellitus with diabetic peripheral angiopathy without gangrene: Secondary | ICD-10-CM | POA: Diagnosis not present

## 2018-12-28 DIAGNOSIS — I110 Hypertensive heart disease with heart failure: Secondary | ICD-10-CM | POA: Diagnosis not present

## 2018-12-28 DIAGNOSIS — D241 Benign neoplasm of right breast: Secondary | ICD-10-CM | POA: Diagnosis not present

## 2018-12-28 DIAGNOSIS — Z79899 Other long term (current) drug therapy: Secondary | ICD-10-CM | POA: Insufficient documentation

## 2018-12-28 DIAGNOSIS — N63 Unspecified lump in unspecified breast: Secondary | ICD-10-CM | POA: Diagnosis present

## 2018-12-28 DIAGNOSIS — I509 Heart failure, unspecified: Secondary | ICD-10-CM | POA: Diagnosis not present

## 2018-12-28 DIAGNOSIS — N6012 Diffuse cystic mastopathy of left breast: Secondary | ICD-10-CM | POA: Diagnosis not present

## 2018-12-28 DIAGNOSIS — G4730 Sleep apnea, unspecified: Secondary | ICD-10-CM | POA: Diagnosis not present

## 2018-12-28 DIAGNOSIS — Z7984 Long term (current) use of oral hypoglycemic drugs: Secondary | ICD-10-CM | POA: Insufficient documentation

## 2018-12-28 DIAGNOSIS — Z87891 Personal history of nicotine dependence: Secondary | ICD-10-CM | POA: Diagnosis not present

## 2018-12-28 DIAGNOSIS — Z6841 Body Mass Index (BMI) 40.0 and over, adult: Secondary | ICD-10-CM | POA: Insufficient documentation

## 2018-12-28 DIAGNOSIS — E785 Hyperlipidemia, unspecified: Secondary | ICD-10-CM | POA: Diagnosis not present

## 2018-12-28 DIAGNOSIS — I5032 Chronic diastolic (congestive) heart failure: Secondary | ICD-10-CM | POA: Diagnosis not present

## 2018-12-28 HISTORY — PX: BREAST LUMPECTOMY WITH RADIOACTIVE SEED LOCALIZATION: SHX6424

## 2018-12-28 LAB — GLUCOSE, CAPILLARY
Glucose-Capillary: 104 mg/dL — ABNORMAL HIGH (ref 70–99)
Glucose-Capillary: 115 mg/dL — ABNORMAL HIGH (ref 70–99)

## 2018-12-28 SURGERY — BREAST LUMPECTOMY WITH RADIOACTIVE SEED LOCALIZATION
Anesthesia: General | Site: Breast | Laterality: Right

## 2018-12-28 MED ORDER — ALBUTEROL SULFATE (2.5 MG/3ML) 0.083% IN NEBU
INHALATION_SOLUTION | RESPIRATORY_TRACT | Status: AC
  Filled 2018-12-28: qty 3

## 2018-12-28 MED ORDER — ONDANSETRON HCL 4 MG/2ML IJ SOLN
INTRAMUSCULAR | Status: AC
  Filled 2018-12-28: qty 2

## 2018-12-28 MED ORDER — FENTANYL CITRATE (PF) 250 MCG/5ML IJ SOLN
INTRAMUSCULAR | Status: DC | PRN
  Administered 2018-12-28 (×2): 50 ug via INTRAVENOUS

## 2018-12-28 MED ORDER — EPHEDRINE SULFATE 50 MG/ML IJ SOLN
INTRAMUSCULAR | Status: DC | PRN
  Administered 2018-12-28: 10 mg via INTRAVENOUS

## 2018-12-28 MED ORDER — GABAPENTIN 300 MG PO CAPS
300.0000 mg | ORAL_CAPSULE | ORAL | Status: AC
  Administered 2018-12-28: 300 mg via ORAL
  Filled 2018-12-28: qty 1

## 2018-12-28 MED ORDER — CHLORHEXIDINE GLUCONATE CLOTH 2 % EX PADS: 6.0000 | MEDICATED_PAD | Freq: Once | CUTANEOUS | Status: DC

## 2018-12-28 MED ORDER — MIDAZOLAM HCL 5 MG/5ML IJ SOLN
INTRAMUSCULAR | Status: DC | PRN
  Administered 2018-12-28: 2 mg via INTRAVENOUS

## 2018-12-28 MED ORDER — BUPIVACAINE-EPINEPHRINE 0.5% -1:200000 IJ SOLN
INTRAMUSCULAR | Status: AC
  Filled 2018-12-28: qty 1

## 2018-12-28 MED ORDER — ROCURONIUM BROMIDE 100 MG/10ML IV SOLN
INTRAVENOUS | Status: DC | PRN
  Administered 2018-12-28: 20 mg via INTRAVENOUS

## 2018-12-28 MED ORDER — 0.9 % SODIUM CHLORIDE (POUR BTL) OPTIME
TOPICAL | Status: DC | PRN
  Administered 2018-12-28: 1000 mL

## 2018-12-28 MED ORDER — CELECOXIB 200 MG PO CAPS
200.0000 mg | ORAL_CAPSULE | ORAL | Status: AC
  Administered 2018-12-28: 200 mg via ORAL
  Filled 2018-12-28: qty 1

## 2018-12-28 MED ORDER — MIDAZOLAM HCL 2 MG/2ML IJ SOLN
INTRAMUSCULAR | Status: AC
  Filled 2018-12-28: qty 2

## 2018-12-28 MED ORDER — SUGAMMADEX SODIUM 200 MG/2ML IV SOLN
INTRAVENOUS | Status: DC | PRN
  Administered 2018-12-28: 200 mg via INTRAVENOUS

## 2018-12-28 MED ORDER — PROPOFOL 10 MG/ML IV BOLUS
INTRAVENOUS | Status: DC | PRN
  Administered 2018-12-28: 200 mg via INTRAVENOUS
  Administered 2018-12-28: 40 mg via INTRAVENOUS

## 2018-12-28 MED ORDER — SUCCINYLCHOLINE CHLORIDE 20 MG/ML IJ SOLN
INTRAMUSCULAR | Status: DC | PRN
  Administered 2018-12-28: 100 mg via INTRAVENOUS

## 2018-12-28 MED ORDER — LACTATED RINGERS IV SOLN
INTRAVENOUS | Status: DC
  Administered 2018-12-28: 09:00:00 via INTRAVENOUS

## 2018-12-28 MED ORDER — FENTANYL CITRATE (PF) 100 MCG/2ML IJ SOLN: 25.0000 ug | INTRAMUSCULAR | Status: DC | PRN

## 2018-12-28 MED ORDER — MEPERIDINE HCL 25 MG/ML IJ SOLN: 6.2500 mg | INTRAMUSCULAR | Status: DC | PRN

## 2018-12-28 MED ORDER — HYDROCODONE-ACETAMINOPHEN 5-325 MG PO TABS: 1.0000 | ORAL_TABLET | Freq: Four times a day (QID) | ORAL | 0 refills | Status: DC | PRN

## 2018-12-28 MED ORDER — LIDOCAINE HCL (CARDIAC) PF 100 MG/5ML IV SOSY
PREFILLED_SYRINGE | INTRAVENOUS | Status: DC | PRN
  Administered 2018-12-28: 60 mg via INTRATRACHEAL

## 2018-12-28 MED ORDER — FENTANYL CITRATE (PF) 250 MCG/5ML IJ SOLN
INTRAMUSCULAR | Status: AC
  Filled 2018-12-28: qty 5

## 2018-12-28 MED ORDER — ONDANSETRON HCL 4 MG/2ML IJ SOLN
INTRAMUSCULAR | Status: DC | PRN
  Administered 2018-12-28: 4 mg via INTRAVENOUS

## 2018-12-28 MED ORDER — BUPIVACAINE-EPINEPHRINE 0.5% -1:200000 IJ SOLN
INTRAMUSCULAR | Status: DC | PRN
  Administered 2018-12-28: 20 mL

## 2018-12-28 MED ORDER — ACETAMINOPHEN 500 MG PO TABS
1000.0000 mg | ORAL_TABLET | ORAL | Status: AC
  Administered 2018-12-28: 1000 mg via ORAL
  Filled 2018-12-28: qty 2

## 2018-12-28 MED ORDER — ONDANSETRON HCL 4 MG/2ML IJ SOLN
4.0000 mg | Freq: Once | INTRAMUSCULAR | Status: AC | PRN
  Administered 2018-12-28: 4 mg via INTRAVENOUS

## 2018-12-28 MED ORDER — LACTATED RINGERS IV SOLN
INTRAVENOUS | Status: DC | PRN
  Administered 2018-12-28: 10:00:00 via INTRAVENOUS

## 2018-12-28 MED ORDER — PROPOFOL 10 MG/ML IV BOLUS
INTRAVENOUS | Status: AC
  Filled 2018-12-28: qty 40

## 2018-12-28 SURGICAL SUPPLY — 32 items
APPLIER CLIP 9.375 MED OPEN (MISCELLANEOUS)
BINDER BREAST LRG (GAUZE/BANDAGES/DRESSINGS) IMPLANT
BINDER BREAST XLRG (GAUZE/BANDAGES/DRESSINGS) IMPLANT
CANISTER SUCT 3000ML PPV (MISCELLANEOUS) ×2 IMPLANT
CHLORAPREP W/TINT 26 (MISCELLANEOUS) ×2 IMPLANT
CLIP APPLIE 9.375 MED OPEN (MISCELLANEOUS) IMPLANT
COVER PROBE W GEL 5X96 (DRAPES) ×2 IMPLANT
COVER SURGICAL LIGHT HANDLE (MISCELLANEOUS) ×2 IMPLANT
COVER WAND RF STERILE (DRAPES) IMPLANT
DERMABOND ADVANCED (GAUZE/BANDAGES/DRESSINGS) ×1
DERMABOND ADVANCED .7 DNX12 (GAUZE/BANDAGES/DRESSINGS) ×1 IMPLANT
DEVICE DUBIN SPECIMEN MAMMOGRA (MISCELLANEOUS) ×2 IMPLANT
DRAPE CHEST BREAST 15X10 FENES (DRAPES) ×2 IMPLANT
ELECT COATED BLADE 2.86 ST (ELECTRODE) ×2 IMPLANT
ELECT REM PT RETURN 9FT ADLT (ELECTROSURGICAL) ×2
ELECTRODE REM PT RTRN 9FT ADLT (ELECTROSURGICAL) ×1 IMPLANT
GLOVE BIO SURGEON STRL SZ7.5 (GLOVE) ×4 IMPLANT
GOWN STRL REUS W/ TWL LRG LVL3 (GOWN DISPOSABLE) ×2 IMPLANT
GOWN STRL REUS W/TWL LRG LVL3 (GOWN DISPOSABLE) ×2
KIT BASIN OR (CUSTOM PROCEDURE TRAY) ×2 IMPLANT
KIT MARKER MARGIN INK (KITS) ×2 IMPLANT
LIGHT WAVEGUIDE WIDE FLAT (MISCELLANEOUS) IMPLANT
NDL HYPO 25GX1X1/2 BEV (NEEDLE) ×1 IMPLANT
NEEDLE HYPO 25GX1X1/2 BEV (NEEDLE) ×2 IMPLANT
NS IRRIG 1000ML POUR BTL (IV SOLUTION) ×2 IMPLANT
PACK GENERAL/GYN (CUSTOM PROCEDURE TRAY) ×2 IMPLANT
SUT MNCRL AB 4-0 PS2 18 (SUTURE) ×2 IMPLANT
SUT SILK 2 0 SH (SUTURE) IMPLANT
SUT VIC AB 3-0 SH 18 (SUTURE) ×2 IMPLANT
SYR CONTROL 10ML LL (SYRINGE) ×2 IMPLANT
TOWEL GREEN STERILE (TOWEL DISPOSABLE) ×2 IMPLANT
TOWEL GREEN STERILE FF (TOWEL DISPOSABLE) ×2 IMPLANT

## 2018-12-28 NOTE — Interval H&P Note (Signed)
History and Physical Interval Note:  12/28/2018 9:30 AM  Stacey Page  has presented today for surgery, with the diagnosis of RIGHT BREAST PAPILLOMA.  The various methods of treatment have been discussed with the patient and family. After consideration of risks, benefits and other options for treatment, the patient has consented to  Procedure(s): RIGHT BREAST LUMPECTOMY WITH RADIOACTIVE SEED LOCALIZATION (Right) as a surgical intervention.  The patient's history has been reviewed, patient examined, no change in status, stable for surgery.  I have reviewed the patient's chart and labs.  Questions were answered to the patient's satisfaction.     Autumn Messing III

## 2018-12-28 NOTE — Op Note (Signed)
12/28/2018  10:58 AM  PATIENT:  Stacey Page  68 y.o. female  PRE-OPERATIVE DIAGNOSIS:  RIGHT BREAST PAPILLOMA  POST-OPERATIVE DIAGNOSIS:  RIGHT BREAST PAPILLOMA  PROCEDURE:  Procedure(s): RIGHT BREAST LUMPECTOMY WITH RADIOACTIVE SEED LOCALIZATION (Right)  SURGEON:  Surgeon(s) and Role:    * Jovita Kussmaul, MD - Primary  PHYSICIAN ASSISTANT:   ASSISTANTS: none   ANESTHESIA:   local and general  EBL:  25 mL   BLOOD ADMINISTERED:none  DRAINS: none   LOCAL MEDICATIONS USED:  MARCAINE     SPECIMEN:  Source of Specimen:  right breast tissue  DISPOSITION OF SPECIMEN:  PATHOLOGY  COUNTS:  YES  TOURNIQUET:  * No tourniquets in log *  DICTATION: .Dragon Dictation   After informed consent was obtained the patient was brought to the operating room and placed in the supine position on the operating table.  After adequate induction of general anesthesia the patient's right breast was prepped with ChloraPrep, allowed to dry, and draped in usual sterile manner.  An appropriate timeout was performed.  Previously an I-125 seed was placed in the medial subareolar right breast to mark an area of intraductal papilloma.  The neoprobe was set to I-125 in the area of radioactivity was readily identified.  The area around this was infiltrated with quarter percent Marcaine.  A curvilinear incision was then made at the medial edge of the areola with a 15 blade knife.  The incision was carried through the skin and subcutaneous tissue sharply with the electrocautery.  Dissection was then carried out in the subareolar area of the inner right breast.  The area of the radioactive seed was readily identified.  A circular portion of breast tissue was then excised sharply with the electrocautery around the radioactive seed while checking the area of radioactivity frequently.  Once the specimen was removed it was oriented with the appropriate paint colors.  A specimen radiograph was obtained that showed the  clip and seed to be near the center of the specimen.  The specimen was then sent to pathology for further evaluation.  Hemostasis was achieved using the Bovie electrocautery.  The wound was irrigated with saline and infiltrated with more quarter percent Marcaine.  The deep layer of the wound was then closed with layers of interrupted 3-0 Vicryl stitches.  The skin was closed with interrupted 4-0 Monocryl subcuticular stitches.  Dermabond dressings were applied.  The patient tolerated the procedure well.  At the end of the case all needle sponge and instrument counts were correct.  The patient was then awakened and taken to recovery in stable condition.  PLAN OF CARE: Discharge to home after PACU  PATIENT DISPOSITION:  PACU - hemodynamically stable.   Delay start of Pharmacological VTE agent (>24hrs) due to surgical blood loss or risk of bleeding: not applicable

## 2018-12-28 NOTE — Progress Notes (Signed)
Remains moderately sleepy, rouses readily. Can not maintain o2 sat >85 off 02 2lnc after attempts x 3 ; lasts about 39mins before sats drop

## 2018-12-28 NOTE — Progress Notes (Signed)
Maintained 02 sat at 98-100 x 2o mins/ more alert. Moved to pahse 2

## 2018-12-28 NOTE — Transfer of Care (Signed)
Immediate Anesthesia Transfer of Care Note  Patient: Stacey Page  Procedure(s) Performed: RIGHT BREAST LUMPECTOMY WITH RADIOACTIVE SEED LOCALIZATION (Right Breast)  Patient Location: PACU  Anesthesia Type:General  Level of Consciousness: drowsy  Airway & Oxygen Therapy: Patient Spontanous Breathing and Patient connected to face mask oxygen  Post-op Assessment: Report given to RN and Post -op Vital signs reviewed and stable  Post vital signs: Reviewed and stable  Last Vitals:  Vitals Value Taken Time  BP    Temp 36.1 C 12/28/18 1116  Pulse 49 12/28/18 1117  Resp 16 12/28/18 1117  SpO2 100 % 12/28/18 1117  Vitals shown include unvalidated device data.  Last Pain:  Vitals:   12/28/18 0846  PainSc: 0-No pain      Patients Stated Pain Goal: 2 (23/76/28 3151)  Complications: No apparent anesthesia complications

## 2018-12-28 NOTE — Anesthesia Procedure Notes (Signed)
Procedure Name: Intubation Date/Time: 12/28/2018 10:21 AM Performed by: Clovis Cao, CRNA Pre-anesthesia Checklist: Patient identified, Emergency Drugs available, Suction available, Patient being monitored and Timeout performed Patient Re-evaluated:Patient Re-evaluated prior to induction Oxygen Delivery Method: Circle system utilized Preoxygenation: Pre-oxygenation with 100% oxygen (attempted LMA #4 without adequate chest rise or seal.  Converted to ETT with easy placement) Induction Type: IV induction Ventilation: Mask ventilation without difficulty Laryngoscope Size: Miller and 2 Grade View: Grade I Tube type: Oral Tube size: 7.0 mm Number of attempts: 1 (DL x1 after removal of LMA) Airway Equipment and Method: Stylet Placement Confirmation: ETT inserted through vocal cords under direct vision,  positive ETCO2 and breath sounds checked- equal and bilateral Secured at: 21 cm Tube secured with: Tape Dental Injury: Teeth and Oropharynx as per pre-operative assessment

## 2018-12-28 NOTE — Interval H&P Note (Signed)
History and Physical Interval Note:  12/28/2018 9:30 AM  [name]  has presented today for surgery, with the diagnosis of RIGHT BREAST PAPILLOMA.  The various methods of treatment have been discussed with the patient and family. After consideration of risks, benefits and other options for treatment, the patient has consented to  Procedure(s): RIGHT BREAST LUMPECTOMY WITH RADIOACTIVE SEED LOCALIZATION (Right) as a surgical intervention.  The patient's history has been reviewed, patient examined, no change in status, stable for surgery.  I have reviewed the patient's chart and labs.  Questions were answered to the patient's satisfaction.     Stacey Page

## 2018-12-28 NOTE — H&P (Signed)
[name]  Location: [facility] Patient #: [id number] DOB: 01/27/1951 Single / Language: Stacey Page / Race: Black or African American Female   History of Present Illness The patient is a 68 year old female who presents with a breast mass. We are asked to see the patient in consultation by Dr. Wilfred Lacy to evaluate her for an intraductal papilloma of the right breast. The patient is a 68 year old black female who presents with clear nipple discharge from the right breast. This has been happening about every other week for the last several months. The discharge seems to be spontaneous. It has never been bloody. She does report some occasional soreness in the right breast. She denies any family history of breast cancer. She does not smoke. She underwent a mammogram and ultrasound which did show an intraductal mass in the subareolar 1 o'clock position of the right breast that measured about 3.5 cm. This was biopsied and came back as an intraductal papilloma.   Past Surgical History Cesarean Section - Multiple  Knee Surgery  Right.  Diagnostic Studies History Colonoscopy  1-5 years ago Mammogram  within last year  Allergies  No Known Drug Allergies   Medication History Atorvastatin Calcium (20MG  Tablet, Oral) Active. Carvedilol (12.5MG  Tablet, Oral) Active. Furosemide (40MG  Tablet, Oral) Active. Lisinopril (20MG  Tablet, Oral) Active. Montelukast Sodium (10MG  Tablet, Oral) Active. Januvia (100MG  Tablet, Oral) Active. Ferrous Sulfate (Oral) Specific strength unknown - Active. Medications Reconciled  Social History  Alcohol use  Occasional alcohol use. Caffeine use  Coffee. Tobacco use  Former smoker.  Family History  Arthritis  Father, Mother. Heart Disease  Father. Hypertension  Mother. Malignant Neoplasm Of Pancreas  Mother. Respiratory Condition  Father. Thyroid problems  Father.  Pregnancy / Birth History  Age at menarche   34 years. Age of menopause  81-60 Contraceptive History  Intrauterine device. Gravida  2 Irregular periods  Length (months) of breastfeeding  3-6 Maternal age  81-35 Para  2  Other Problems Arthritis  Chronic Obstructive Lung Disease  Diabetes Mellitus  Heart murmur  High blood pressure  Sleep Apnea     Review of Systems General Not Present- Appetite Loss, Chills, Fatigue, Fever, Night Sweats, Weight Gain and Weight Loss. Note: All other systems negative (unless as noted in HPI & included Review of Systems) Skin Not Present- Change in Wart/Mole, Dryness, Hives, Jaundice, New Lesions, Non-Healing Wounds, Rash and Ulcer. HEENT Not Present- Earache, Hearing Loss, Hoarseness, Nose Bleed, Oral Ulcers, Ringing in the Ears, Seasonal Allergies, Sinus Pain, Sore Throat, Visual Disturbances, Wears glasses/contact lenses and Yellow Eyes. Respiratory Not Present- Bloody sputum, Chronic Cough, Difficulty Breathing, Snoring and Wheezing. Breast Not Present- Breast Mass, Breast Pain, Nipple Discharge and Skin Changes. Cardiovascular Not Present- Chest Pain, Difficulty Breathing Lying Down, Leg Cramps, Palpitations, Rapid Heart Rate, Shortness of Breath and Swelling of Extremities. Gastrointestinal Not Present- Abdominal Pain, Bloating, Bloody Stool, Change in Bowel Habits, Chronic diarrhea, Constipation, Difficulty Swallowing, Excessive gas, Gets full quickly at meals, Hemorrhoids, Indigestion, Nausea, Rectal Pain and Vomiting. Female Genitourinary Not Present- Frequency, Nocturia, Painful Urination, Pelvic Pain and Urgency. Musculoskeletal Not Present- Back Pain, Joint Pain, Joint Stiffness, Muscle Pain, Muscle Weakness and Swelling of Extremities. Neurological Not Present- Decreased Memory, Fainting, Headaches, Numbness, Seizures, Tingling, Tremor, Trouble walking and Weakness. Psychiatric Not Present- Anxiety, Bipolar, Change in Sleep Pattern, Depression, Fearful and Frequent  crying. Endocrine Not Present- Cold Intolerance, Excessive Hunger, Hair Changes, Heat Intolerance, Hot flashes and New Diabetes. Hematology Not Present- Easy Bruising, Excessive  bleeding, Gland problems, HIV and Persistent Infections.  Vitals Weight: 357 lb Height: 68in Body Surface Area: 2.61 m Body Mass Index: 54.28 kg/m  Temp.: 98.52F  BP: 138/84 (Sitting, Left Arm, Standard)       Physical Exam  General Mental Status-Alert. General Appearance-Consistent with stated age. Hydration-Well hydrated. Voice-Normal.  Head and Neck Head-normocephalic, atraumatic with no lesions or palpable masses. Trachea-midline. Thyroid Gland Characteristics - normal size and consistency.  Eye Eyeball - Bilateral-Extraocular movements intact. Sclera/Conjunctiva - Bilateral-No scleral icterus.  Chest and Lung Exam Chest and lung exam reveals -quiet, even and easy respiratory effort with no use of accessory muscles and on auscultation, normal breath sounds, no adventitious sounds and normal vocal resonance. Inspection Chest Wall - Normal. Back - normal.  Breast Note: There is a palpable bruise in the upper inner subareolar right breast. Other than this there is no other palpable mass in either breast. There is no palpable axillary, supraclavicular, or cervical lymphadenopathy.   Cardiovascular Cardiovascular examination reveals -normal heart sounds, regular rate and rhythm with no murmurs and normal pedal pulses bilaterally.  Abdomen Inspection Inspection of the abdomen reveals - No Hernias. Skin - Scar - no surgical scars. Palpation/Percussion Palpation and Percussion of the abdomen reveal - Soft, Non Tender, No Rebound tenderness, No Rigidity (guarding) and No hepatosplenomegaly. Auscultation Auscultation of the abdomen reveals - Bowel sounds normal.  Neurologic Neurologic evaluation reveals -alert and oriented x 3 with no impairment of recent or  remote memory. Mental Status-Normal.  Musculoskeletal Normal Exam - Left-Upper Extremity Strength Normal and Lower Extremity Strength Normal. Normal Exam - Right-Upper Extremity Strength Normal and Lower Extremity Strength Normal.  Lymphatic Head & Neck  General Head & Neck Lymphatics: Bilateral - Description - Normal. Axillary  General Axillary Region: Bilateral - Description - Normal. Tenderness - Non Tender. Femoral & Inguinal  Generalized Femoral & Inguinal Lymphatics: Bilateral - Description - Normal. Tenderness - Non Tender.    Assessment & Plan INTRADUCTAL PAPILLOMA OF BREAST, RIGHT (D24.1) Impression: The patient appears to have a 3.5 cm intraductal papilloma in the subareolar upper inner right breast. Because of the size of the mass involved there is certainly about a 73% chance that we are missing something more significant. Because of this my recommendation would be to have this area removed. I have discussed with her in detail the risks and benefits of the operation as well as some of the technical aspects and she understands and wishes to proceed. I will plan for a right breast radioactive seed localized lumpectomy Current Plans Pt Education - Breast Diseases: discussed with patient and provided information.

## 2018-12-28 NOTE — Anesthesia Preprocedure Evaluation (Addendum)
Anesthesia Evaluation  Patient identified by MRN, date of birth, ID band Patient awake    Reviewed: Allergy & Precautions, H&P , NPO status , Patient's Chart, lab work & pertinent test results, reviewed documented beta blocker date and time   Airway Mallampati: III  TM Distance: >3 FB Neck ROM: Full    Dental  (+) Teeth Intact, Dental Advisory Given, Chipped,    Pulmonary asthma , sleep apnea and Continuous Positive Airway Pressure Ventilation , pneumonia, COPD,  COPD inhaler, former smoker,    Pulmonary exam normal breath sounds clear to auscultation       Cardiovascular hypertension, Pt. on medications and Pt. on home beta blockers + Peripheral Vascular Disease, +CHF and + DOE   Rhythm:Regular Rate:Normal     Neuro/Psych Anxiety Depression negative neurological ROS  negative psych ROS   GI/Hepatic Neg liver ROS, GERD  ,  Endo/Other  diabetes, Type 2, Oral Hypoglycemic AgentsMorbid obesity  Renal/GU Renal InsufficiencyRenal disease  negative genitourinary   Musculoskeletal  (+) Arthritis , Osteoarthritis,    Abdominal (+) + obese,   Peds  Hematology negative hematology ROS (+) anemia ,   Anesthesia Other Findings   Reproductive/Obstetrics negative OB ROS                            Anesthesia Physical  Anesthesia Plan  ASA: IV  Anesthesia Plan: General   Post-op Pain Management:  Regional for Post-op pain   Induction: Intravenous  PONV Risk Score and Plan:   Airway Management Planned: LMA and Oral ETT  Additional Equipment: None  Intra-op Plan:   Post-operative Plan: Extubation in OR  Informed Consent: I have reviewed the patients History and Physical, chart, labs and discussed the procedure including the risks, benefits and alternatives for the proposed anesthesia with the patient or authorized representative who has indicated his/her understanding and acceptance.      Dental advisory given  Plan Discussed with: CRNA  Anesthesia Plan Comments: Karoline Caldwell, [place], note:  Follows yearly with cardiology for hx of HTN, HFpEF, OSA, Mild AS (mean grad 15 mmHg by echo 2019). Last seen by Dr. Ellyn Hack 11/06/18, overall stable at that time. Chronic DOE felt to be mostly related to OHS and deconditioning. Advised continued 50yr f/u.  EKG 11/06/18 (read per Dr. Allison Quarry note): Sinus rhythm, rate 75.  Prolonged QT for LVH with repolarization changes.  Lateral T wave inversions noted, but otherwise normal axis, intervals and durations.   Coronary CT 10/06/17: IMPRESSION: 1. Coronary artery calcium score 11 Agatston units, this places the patient in the 60th percentile for age and gender, suggesting intermediate risk for future cardiac events.  2.  No obstructive CAD.  TTE 03/31/17: - Left ventricle: The cavity size was normal. There was moderate   concentric hypertrophy. Systolic function was normal. The   estimated ejection fraction was in the range of 60% to 65%. Wall   motion was normal; there were no regional wall motion   abnormalities. Features are consistent with a pseudonormal left   ventricular filling pattern, with concomitant abnormal relaxation   and increased filling pressure (grade 2 diastolic dysfunction).   Doppler parameters are consistent with high ventricular filling   pressure. - Aortic valve: There was mild stenosis. Peak velocity (S): 265   cm/s. Mean gradient (S): 15 mm Hg. Peak gradient (S): 28 mm Hg.   Valve area (VTI): 2.27 cm^2. Valve area (Vmax): 1.81 cm^2. Valve   area (Vmean):  1.86 cm^2. - Mitral valve: Mildly calcified annulus. Transvalvular velocity   was within the normal range. There was no evidence for stenosis.   There was no regurgitation. - Right ventricle: The cavity size was normal. Wall thickness was   normal. Systolic function was normal. - Atrial septum: No defect or patent foramen ovale was identified. -  Tricuspid valve: There was mild regurgitation. - Pulmonary arteries: Systolic pressure was within the normal   range. PA peak pressure: 25 mm Hg (S). - Global longitudinal strain -21.2% (normal).  )        Anesthesia Quick Evaluation

## 2018-12-30 NOTE — Anesthesia Postprocedure Evaluation (Signed)
Anesthesia Post Note  Patient: Stacey Page  Procedure(s) Performed: RIGHT BREAST LUMPECTOMY WITH RADIOACTIVE SEED LOCALIZATION (Right Breast)     Patient location during evaluation: PACU Anesthesia Type: General Level of consciousness: sedated and patient cooperative Pain management: pain level controlled Vital Signs Assessment: post-procedure vital signs reviewed and stable Respiratory status: spontaneous breathing Cardiovascular status: stable Anesthetic complications: no    Last Vitals:  Vitals:   12/28/18 1445 12/28/18 1446  BP:    Pulse:    Resp:    Temp:    SpO2: (!) 84% 94%    Last Pain:  Vitals:   12/28/18 1349  PainSc: Asleep                 Nolon Nations

## 2018-12-31 ENCOUNTER — Telehealth: Payer: Self-pay | Admitting: Nurse Practitioner

## 2018-12-31 ENCOUNTER — Encounter (HOSPITAL_COMMUNITY): Payer: Self-pay | Admitting: General Surgery

## 2018-12-31 LAB — SURGICAL PATHOLOGY

## 2018-12-31 NOTE — Telephone Encounter (Signed)
Patient came into office and dropped out medication form that needs to be complete. Form is in [employer] in the front office. Please contact patient once form is complete.

## 2018-12-31 NOTE — Telephone Encounter (Signed)
Got paper work, working on it.

## 2019-01-01 NOTE — Telephone Encounter (Signed)
Pt will come pick up the form, place it up front.

## 2019-01-01 NOTE — Telephone Encounter (Signed)
Pt request patient assistant program paper work fill out for [employer] and nasonex for 3 mo or 6 mo at a times. Please advise, ok to fill out?

## 2019-01-01 NOTE — Telephone Encounter (Signed)
Ok to complete

## 2019-02-26 ENCOUNTER — Encounter: Payer: Self-pay | Admitting: Podiatry

## 2019-02-26 ENCOUNTER — Other Ambulatory Visit: Payer: Self-pay

## 2019-02-26 ENCOUNTER — Ambulatory Visit: Payer: PPO | Admitting: Orthotics

## 2019-02-26 ENCOUNTER — Ambulatory Visit: Payer: PPO | Admitting: Podiatry

## 2019-02-26 DIAGNOSIS — M2142 Flat foot [pes planus] (acquired), left foot: Secondary | ICD-10-CM | POA: Diagnosis not present

## 2019-02-26 DIAGNOSIS — E1142 Type 2 diabetes mellitus with diabetic polyneuropathy: Secondary | ICD-10-CM | POA: Diagnosis not present

## 2019-02-26 DIAGNOSIS — M2141 Flat foot [pes planus] (acquired), right foot: Secondary | ICD-10-CM

## 2019-02-26 DIAGNOSIS — E1151 Type 2 diabetes mellitus with diabetic peripheral angiopathy without gangrene: Secondary | ICD-10-CM

## 2019-02-26 DIAGNOSIS — M2041 Other hammer toe(s) (acquired), right foot: Secondary | ICD-10-CM

## 2019-02-26 DIAGNOSIS — M2042 Other hammer toe(s) (acquired), left foot: Secondary | ICD-10-CM | POA: Diagnosis not present

## 2019-02-26 NOTE — Progress Notes (Signed)
This patient presents the office for diabetic foot exam as well as requesting diabetic shoes.  Patient is diabetic and is taking [place]..  Patient has been diagnosed with diabetic neuropathy.  He presents the office today requesting diabetic shoes.    Vascular  Dorsalis pedis and posterior tibial pulses are palpable  B/L.  Capillary return  WNL.  Temperature gradient is  WNL.  Skin turgor  WNL  Sensorium  Senn Weinstein monofilament wire  Diminished . Diminished  tactile sensation.  Nail Exam  Patient has normal nails with no evidence of bacterial or fungal infection.  Orthopedic  Exam  Muscle tone and muscle strength  WNL.  No limitations of motion feet  B/L.  No crepitus or joint effusion noted.  Foot type is unremarkable and digits show no abnormalities.  Bony prominences are unremarkable  Pes planus  B/L.  Hammer toes  B/L  Skin  No open lesions.  Normal skin texture and turgor.   Diabetic neuropathy.    ROV.    Diabetic foot exam reveals no evidence of vascular pathology.  Patient has diminished/absent  LOPS  B/L.  Patient qualifies for diabetic shoes due to DPN and  Pes planus and hammer toes  B/L.  Patient to make an appointment with the pedorthikst.  RTC 1 year for annual foot exam.   Gardiner Barefoot DPM

## 2019-02-26 NOTE — Progress Notes (Signed)
Patient was foam cast today and picked [id number] grey shoe; size 34m.  She will call us when she gets appoitment w/ doc instead of NP

## 2019-02-28 ENCOUNTER — Telehealth (INDEPENDENT_AMBULATORY_CARE_PROVIDER_SITE_OTHER): Payer: PPO | Admitting: Nurse Practitioner

## 2019-02-28 ENCOUNTER — Encounter: Payer: Self-pay | Admitting: Nurse Practitioner

## 2019-02-28 ENCOUNTER — Other Ambulatory Visit: Payer: Self-pay

## 2019-02-28 VITALS — Ht 68.0 in

## 2019-02-28 DIAGNOSIS — E785 Hyperlipidemia, unspecified: Secondary | ICD-10-CM

## 2019-02-28 DIAGNOSIS — I10 Essential (primary) hypertension: Secondary | ICD-10-CM

## 2019-02-28 DIAGNOSIS — E1151 Type 2 diabetes mellitus with diabetic peripheral angiopathy without gangrene: Secondary | ICD-10-CM

## 2019-02-28 NOTE — Progress Notes (Signed)
Virtual Visit via Video Note  I connected with@ on 02/28/19 at  8:15 AM EST by a video enabled telemedicine application and verified that I am speaking with the correct person using two identifiers.  Location: Patient:Home Provider: Office Participants: patient and provider   I connected with "Patient Name" today by a video enabled telemedicine application and verified that I am speaking with the correct person using two identifiers. Location patient: home Location provider: work Persons participating in the virtual visit: patient, provider  I discussed the limitations of evaluation and management by telemedicine and the availability of in person appointments. I also discussed with the patient that there may be a patient responsible charge related to this service. The patient expressed understanding and agreed to proceed.  CC:3 mo follow up/ still has pain after right breast surgery here and there/ dibetic shoes need more information [facility] [phone]  History of Present Illness: DM: Controlled with HgbA1c of 6.1 with use of januvia Positive neuropathy, negative urine microalbumin, up to date with eye exam Last foot exam 02/2019 by podiatry Does not monitor BP or glucose at home. LDL at goal with atorvastatin. Up to date with eye exam. Unable to afford dental exam and cleaning.  HTN and diastolic CHF: Last BP recorded, BP was at goal Current use of lisinopril, furosemide and coreg Does not check BP nor weight at home. Reports she is compliant with CPAP and medications Under the care of cardiology, last OV 10/2018, she is to f/up annually Denies any CP, palpitations SOB, PND or cough.  Right breast intraductal Papilloma: Biopsy done 03/2018. Lumpectomy completed 12/2018 with localized radioactive seed, pathology results benign. No additional f/up recommended Reported intermittent pain, but  no drainage.  BP Readings from Last 3 Encounters:  12/28/18 118/69   12/24/18 136/63  11/30/18 (!) 146/90   Wt Readings from Last 3 Encounters:  12/28/18 (!) 348 lb 8 oz (158.1 kg)  12/24/18 (!) 348 lb 8 oz (158.1 kg)  11/28/18 (!) 357 lb (161.9 kg)   Observations/Objective: Physical Exam  Constitutional: She is oriented to person, place, and time. No distress.  Pulmonary/Chest: Effort normal.  Neurological: She is alert and oriented to person, place, and time.   Assessment and Plan: [place] was seen today for follow-up.  Diagnoses and all orders for this visit:  Type 2 diabetes mellitus with diabetic peripheral angiopathy without gangrene, without long-term current use of insulin (HCC)  Essential hypertension  Hyperlipidemia LDL goal <70   Follow Up Instructions: See avs   I discussed the assessment and treatment plan with the patient. The patient was provided an opportunity to ask questions and all were answered. The patient agreed with the plan and demonstrated an understanding of the instructions.   The patient was advised to call back or seek an in-person evaluation if the symptoms worsen or if the condition fails to improve as anticipated.  Wilfred Lacy, NP

## 2019-02-28 NOTE — Patient Instructions (Addendum)
Maintain appt with Dr. Loletha Grayer Maintain current medications. It is important to check your weight daily in AM and maintain DASH diet. You are to call office if you notice weight gain >5lbs in 1week. You are due for f/up with cardiology in September.

## 2019-02-28 NOTE — Assessment & Plan Note (Signed)
Last BP recorded, BP was at goal Current use of lisinopril, furosemide and coreg Does not check BP nor weight at home. Reports she is compliant with CPAP and medications Under the care of cardiology, last OV 10/2018, she is to f/up annually Denies any CP, palpitations SOB, PND or cough.  F/up in 66month Advised to check BP at least 2x/week and weight every AM.

## 2019-02-28 NOTE — Assessment & Plan Note (Signed)
Controlled with HgbA1c of 6.1 with use of januvia Positive neuropathy, negative urine microalbumin, up to date with eye exam Last foot exam 02/2019 by podiatry Does not monitor BP or glucose at home. LDL at goal with atorvastatin. Up to date with eye exam. Unable to afford dental exam and cleaning.  F/up in 87months, will repeat labs then Maintain current medications

## 2019-03-01 ENCOUNTER — Ambulatory Visit (INDEPENDENT_AMBULATORY_CARE_PROVIDER_SITE_OTHER): Payer: PPO | Admitting: Family Medicine

## 2019-03-01 ENCOUNTER — Encounter: Payer: Self-pay | Admitting: Family Medicine

## 2019-03-01 VITALS — HR 57 | Temp 97.2°F | Ht 68.0 in | Wt 352.6 lb

## 2019-03-01 DIAGNOSIS — E1142 Type 2 diabetes mellitus with diabetic polyneuropathy: Secondary | ICD-10-CM | POA: Diagnosis not present

## 2019-03-01 DIAGNOSIS — G63 Polyneuropathy in diseases classified elsewhere: Secondary | ICD-10-CM

## 2019-03-01 NOTE — Progress Notes (Signed)
Stacey Page is a 69 y.o. female  Chief Complaint  Patient presents with  . Diabetes    follow up, foot exam    HPI: Stacey Page is a 69 y.o. female patient of my colleague Wilfred Lacy who is here today for a diabetic foot exam and completion of paperwork for diabetic shoes. She needs a letter from an MD/DO stating she is diabetic and needs diabetic shoes and inserts. She states recent lab (HgbA1C) needs to be included. No ulcers or open wounds on feet. She endorses neuropathy in B/L feet. She follows with podiatry for her diabetic foot care, B/L plantar fasciitis. Last foot exam 02/26/2019 with podiatry Gardiner Barefoot at [facility]. A&P from Dr. Prudence Davidson - "Patient qualifies for diabetic shoes due to DPN and Pes planus and hammer toes B/L."  Lab Results  Component Value Date   HGBA1C 6.1 11/28/2018     Past Medical History:  Diagnosis Date  . Anxiety    doesn't take any meds  . Arthritis of right knee 03/14/2016  . Asthma   . Chronic diastolic CHF (congestive heart failure) (HCC)    HF with Preserved EF (60-65%) - Grade II Diastolic Dysfunction (Hypertensive Heart Disease). takes Furosemide daily  . COPD (chronic obstructive pulmonary disease) (HCC)    Albuterol as needed  . Depression    doesn't take meds  . Diabetes ([place])    takes Januvia daily  . Eczema    uses cream as needed  . GERD (gastroesophageal reflux disease)   . History of bronchitis as a child   . HTN (hypertension)   . Insomnia   . Mild aortic stenosis by prior echocardiogram 04/2017   Mild stenosis: Mean gradient 15 mmHg, peak gradient 28 mmHg  . OA (osteoarthritis)   . OSA (obstructive sleep apnea) 02/25/2014   wears CPAP at night  . Peripheral neuropathy    takes Gabapentin as needed  . Pneumonia    hx of-2010  . Seasonal allergies    uses Flonase daily    Past Surgical History:  Procedure Laterality Date  . BREAST LUMPECTOMY WITH RADIOACTIVE SEED LOCALIZATION Right 12/28/2018   Procedure: RIGHT BREAST LUMPECTOMY WITH RADIOACTIVE SEED LOCALIZATION;  Surgeon: Jovita Kussmaul, MD;  Location: [facility];  Service: General;  Laterality: Right;  . CESAREAN SECTION  x2  . COLONOSCOPY    . CORONARY CALCIUM SCORE & CT ANGIOGRAM  09/2017   Coronary Ca Score = 11 (low).  CTA- no obstructive CAD (minimal disease)  . JOINT REPLACEMENT    . KNEE ARTHROSCOPY Right   . LUNG BIOPSY Right 06/10/2014   Procedure: LUNG BIOPSY;  Surgeon: Ivin Poot, MD;  Location: [facility];  Service: Thoracic;  Laterality: Right;  . POLYPECTOMY     throat  . TOTAL KNEE ARTHROPLASTY Right 03/14/2016   Procedure: RIGHT TOTAL KNEE ARTHROPLASTY;  Surgeon: Marybelle Killings, MD;  Location: [facility];  Service: Orthopedics;  Laterality: Right;  . TRANSTHORACIC ECHOCARDIOGRAM  03/2017    EF 60-65% moderate concentric LVH GRII DD.  Mild aortic stenosis (mean gradient 15 mmHg, peak gradient 28 mmHg).  . VIDEO ASSISTED THORACOSCOPY Right 06/10/2014   Procedure: VIDEO ASSISTED THORACOSCOPY;  Surgeon: Ivin Poot, MD;  Location: [facility] OR;  Service: Thoracic;  Laterality: Right;    Social History   Socioeconomic History  . Marital status: Widowed    Spouse name: Not on file  . Number of children: Not on file  . Years of education:  Not on file  . Highest education level: Not on file  Occupational History  . Occupation: disabled  Tobacco Use  . Smoking status: Former Smoker    Packs/day: 0.25    Years: 15.00    Pack years: 3.75    Types: Cigarettes    Quit date: 02/08/1980    Years since quitting: 39.0  . Smokeless tobacco: Former Systems developer  . Tobacco comment: 2 packs per week  Substance and Sexual Activity  . Alcohol use: Yes    Alcohol/week: 0.0 standard drinks    Comment: occassional/social/rare  . Drug use: Not Currently  . Sexual activity: Not Currently  Other Topics Concern  . Not on file  Social History Narrative  . Not on file   Social Determinants of Health   Financial Resource Strain:   . Difficulty of  Paying Living Expenses: Not on file  Food Insecurity:   . Worried About Charity fundraiser in the Last Year: Not on file  . Ran Out of Food in the Last Year: Not on file  Transportation Needs:   . Lack of Transportation (Medical): Not on file  . Lack of Transportation (Non-Medical): Not on file  Physical Activity:   . Days of Exercise per Week: Not on file  . Minutes of Exercise per Session: Not on file  Stress:   . Feeling of Stress : Not on file  Social Connections:   . Frequency of Communication with Friends and Family: Not on file  . Frequency of Social Gatherings with Friends and Family: Not on file  . Attends Religious Services: Not on file  . Active Member of Clubs or Organizations: Not on file  . Attends Archivist Meetings: Not on file  . Marital Status: Not on file  Intimate Partner Violence:   . Fear of Current or Ex-Partner: Not on file  . Emotionally Abused: Not on file  . Physically Abused: Not on file  . Sexually Abused: Not on file    Family History  Problem Relation Age of Onset  . COPD Father   . Hypothyroidism Father   . Anemia Father        iron deficiency  . Cancer Mother 49       pancreatic     Immunization History  Administered Date(s) Administered  . Fluad Quad(high Dose 65+) 11/28/2018  . Influenza Split 11/21/2016  . Influenza,inj,Quad PF,6+ Mos 01/12/2015, 11/19/2015  . Pneumococcal Conjugate-13 01/19/2017  . Pneumococcal Polysaccharide-23 01/12/2015    Outpatient Encounter Medications as of 03/01/2019  Medication Sig  . acetaminophen (TYLENOL) 500 MG tablet Take 1 tablet (500 mg total) by mouth every 6 (six) hours as needed.  Marland Kitchen albuterol (PROAIR HFA) 108 (90 Base) MCG/ACT inhaler Inhale 2 puffs into the lungs every 6 (six) hours as needed for wheezing or shortness of breath.  Marland Kitchen aspirin 81 MG tablet Take 1 tablet (81 mg total) by mouth daily with breakfast.  . atorvastatin (LIPITOR) 20 MG tablet Take 1 tablet (20 mg total) by  mouth daily at 6 PM.  . Calcium Carbonate-Vitamin D (CALCIUM 600+D) 600-400 MG-UNIT tablet Take 1 tablet by mouth daily.  . cholecalciferol (VITAMIN D) 1000 units tablet Take 1,000 Units by mouth daily.  . Clobetasol Prop Emollient Base (CLOBETASOL PROPIONATE E) 0.05 % emollient cream Apply 1 application topically daily as needed (skin irritations).  . ferrous sulfate (FEROSUL) 325 (65 FE) MG tablet Take 325 mg by mouth daily with breakfast.  . furosemide (LASIX) 40 MG  tablet TAKE 1 TAB DAILY, TAKE AN ADDITIONAL TAB AT 2 PM IF INCREASED SWELLING AND/OR SHORTNESS OF BREATH  . lisinopril (ZESTRIL) 20 MG tablet Take 1 tablet (20 mg total) by mouth daily.  . mometasone (NASONEX) 50 MCG/ACT nasal spray Place 2 sprays into the nose daily. (Patient taking differently: Place 2 sprays into the nose daily as needed (allergies). )  . montelukast (SINGULAIR) 10 MG tablet Take 1 tablet (10 mg total) by mouth at bedtime.  . sitaGLIPtin (JANUVIA) 100 MG tablet Take 1 tablet (100 mg total) by mouth daily.  . vitamin C (ASCORBIC ACID) 500 MG tablet Take 500 mg by mouth daily.   . carvedilol (COREG) 25 MG tablet Take 1 tablet (25 mg total) by mouth 2 (two) times daily.   No facility-administered encounter medications on file as of 03/01/2019.     ROS: Pertinent positives and negatives noted in HPI. Remainder of ROS non-contributory   No Known Allergies  Pulse (!) 57   Temp (!) 97.2 F (36.2 C) (Temporal)   Ht 5\' 8"  (1.727 m)   Wt (!) 352 lb 9.6 oz (159.9 kg)   SpO2 99%   BMI 53.61 kg/m   Physical Exam  Constitutional: She is oriented to person, place, and time and well-developed, well-nourished, and in no distress. No distress.  Cardiovascular: Intact distal pulses.  Neurological: She is alert and oriented to person, place, and time. She displays normal reflexes. She exhibits normal muscle tone.  B/L feet with diminished tactile sensation, diminished pulses, B/L hammer toes  Psychiatric: Mood and  affect normal.  Skin  No open lesions. Normal skin texture and turgor.   A/P:  1. Polyneuropathy associated with underlying disease ([place]) 2. Type 2 diabetes mellitus with diabetic polyneuropathy, without long-term current use of insulin (HCC) - DM is well-controlled, pt follows regularly with PCP and podiatry - UTD on diabetic foot exam and podiatrist confirmed pt is candidate for diabetic shoes and inserts - note written and faxed to [facility], copy also given to pt  This visit occurred during the SARS-CoV-2 public health emergency.  Safety protocols were in place, including screening questions prior to the visit, additional usage of staff PPE, and extensive cleaning of exam room while observing appropriate contact time as indicated for disinfecting solutions.

## 2019-03-25 ENCOUNTER — Other Ambulatory Visit: Payer: Self-pay

## 2019-03-25 ENCOUNTER — Ambulatory Visit (INDEPENDENT_AMBULATORY_CARE_PROVIDER_SITE_OTHER): Payer: PPO | Admitting: Bariatrics

## 2019-03-25 ENCOUNTER — Encounter (INDEPENDENT_AMBULATORY_CARE_PROVIDER_SITE_OTHER): Payer: Self-pay | Admitting: Bariatrics

## 2019-03-25 ENCOUNTER — Other Ambulatory Visit: Payer: Self-pay | Admitting: Nurse Practitioner

## 2019-03-25 VITALS — BP 166/81 | HR 63 | Temp 97.9°F | Ht 66.0 in | Wt 344.0 lb

## 2019-03-25 DIAGNOSIS — E559 Vitamin D deficiency, unspecified: Secondary | ICD-10-CM | POA: Diagnosis not present

## 2019-03-25 DIAGNOSIS — N1832 Chronic kidney disease, stage 3b: Secondary | ICD-10-CM | POA: Diagnosis not present

## 2019-03-25 DIAGNOSIS — R0602 Shortness of breath: Secondary | ICD-10-CM | POA: Diagnosis not present

## 2019-03-25 DIAGNOSIS — R5383 Other fatigue: Secondary | ICD-10-CM

## 2019-03-25 DIAGNOSIS — G4733 Obstructive sleep apnea (adult) (pediatric): Secondary | ICD-10-CM

## 2019-03-25 DIAGNOSIS — I10 Essential (primary) hypertension: Secondary | ICD-10-CM

## 2019-03-25 DIAGNOSIS — Z6841 Body Mass Index (BMI) 40.0 and over, adult: Secondary | ICD-10-CM | POA: Diagnosis not present

## 2019-03-25 DIAGNOSIS — E119 Type 2 diabetes mellitus without complications: Secondary | ICD-10-CM | POA: Diagnosis not present

## 2019-03-25 DIAGNOSIS — E7849 Other hyperlipidemia: Secondary | ICD-10-CM | POA: Diagnosis not present

## 2019-03-25 DIAGNOSIS — Z1331 Encounter for screening for depression: Secondary | ICD-10-CM | POA: Diagnosis not present

## 2019-03-25 DIAGNOSIS — Z1231 Encounter for screening mammogram for malignant neoplasm of breast: Secondary | ICD-10-CM

## 2019-03-25 DIAGNOSIS — Z0289 Encounter for other administrative examinations: Secondary | ICD-10-CM

## 2019-03-25 NOTE — Progress Notes (Signed)
Dear Stacey Lacy, NP,   Thank you for referring Stacey Page to our clinic. The following note includes my evaluation and treatment recommendations.  Chief Complaint:   OBESITY Stacey Page (MR# [id number]) is a 69 y.o. female who presents for evaluation and treatment of obesity and related comorbidities. Current BMI is Body mass index is 55.52 kg/m.Stacey Page Kitchen Stacey Page has been struggling with her weight for many years and has been unsuccessful in either losing weight, maintaining weight loss, or reaching her healthy weight goal.  Stacey Page is currently in the action stage of change and ready to dedicate time achieving and maintaining a healthier weight. Stacey Page is interested in becoming our patient and working on intensive lifestyle modifications including (but not limited to) diet and exercise for weight loss.  Stacey Page does not like to cook. She craves carbohydrates. She skips lunch. She considers overeating to be her worst habit.   Stacey Page's habits were reviewed today and are as follows: Her family eats meals together, she thinks her family will eat healthier with her, she has been heavy most of her life, she started gaining weight in 2020, she craves potatoes and spaghetti, she skips lunch frequently, she frequently makes poor food choices, she has binge eating behaviors and she struggles with emotional eating.  Depression Screen Stacey Page's Food and Mood (modified PHQ-9) score was 20.  Depression screen PHQ 2/9 03/25/2019  Decreased Interest 2  Down, Depressed, Hopeless 2  PHQ - 2 Score 4  Altered sleeping 3  Tired, decreased energy 2  Change in appetite 3  Feeling bad or failure about yourself  2  Trouble concentrating 3  Moving slowly or fidgety/restless 3  Suicidal thoughts 0  PHQ-9 Score 20  Difficult doing work/chores Somewhat difficult  Some recent data might be hidden   Subjective:   Other fatigue. Stacey Page denies daytime somnolence. Stacey Page generally  gets 6-7 hours of sleep per night, and states that she does not sleep well most nights. Snoring is present. Apneic episodes are present. Epworth Sleepiness Score is 3.  Shortness of breath on exertion. Stacey Page notes increasing shortness of breath with certain activities and seems to be worsening over time with weight gain. She notes getting out of breath sooner with activity than she used to. This has gotten worse recently. Stacey Page denies shortness of breath at rest or orthopnea. She reports energy is okay today and states she naps during the day.  Essential hypertension. Stacey Page is taking Carvedilol and lisinopril (did not take medications today). Blood pressure is elevated today at 166/81, not at goal.  BP Readings from Last 3 Encounters:  03/25/19 (!) 166/81  12/28/18 118/69  12/24/18 136/63   Lab Results  Component Value Date   CREATININE 1.57 (H) 12/24/2018   CREATININE 1.18 11/28/2018   CREATININE 1.34 (H) 01/16/2018   Type 2 diabetes mellitus without complication, without long-term current use of insulin (Stacey Page). Stacey Page is taking Januvia.  Lab Results  Component Value Date   HGBA1C 6.1 11/28/2018   HGBA1C 6.2 01/16/2018   HGBA1C 5.9 01/19/2017   Lab Results  Component Value Date   MICROALBUR 1.9 01/16/2018   LDLCALC 64 11/28/2018   CREATININE 1.57 (H) 12/24/2018   No results found for: INSULIN  OSA (obstructive sleep apnea). Stacey Page has a diagnosis of sleep apnea. She reports that she is using a CPAP regularly and reports restful sleep.  Chronic renal impairment, stage 3b. Stacey Page does not see Nephrology.  Other hyperlipidemia. Stacey Page is taking Lipitor.   Lab Results  Component Value Date   CHOL 132 11/28/2018   HDL 52.60 11/28/2018   LDLCALC 64 11/28/2018   TRIG 78.0 11/28/2018   CHOLHDL 3 11/28/2018   Lab Results  Component Value Date   ALT 11 11/28/2018   AST 14 11/28/2018   ALKPHOS 67 11/28/2018   BILITOT 0.4 11/28/2018   The 10-year  ASCVD risk score Stacey Page DC Jr., et al., 2013) is: 27%   Values used to calculate the score:     Age: 64 years     Sex: Female     Is Non-Hispanic African American: Yes     Diabetic: Yes     Tobacco smoker: No     Systolic Blood Pressure: 017 mmHg     Is BP treated: Yes     HDL Cholesterol: 52.6 mg/dL     Total Cholesterol: 132 mg/dL  Vitamin D deficiency. Stacey Page is taking Vitamins D and C over-the-counter.  Depression screening. Stacey Page had a strongly positive depression screen with a PHQ-9 score of 20.   Assessment/Plan:   Other fatigue. Stacey Page does feel that her weight is causing her energy to be lower than it should be. Fatigue may be related to obesity, depression or many other causes. Labs will be ordered, and in the meanwhile, Stacey Page will focus on self care including making healthy food choices, increasing physical activity and focusing on stress reduction.    EKG 12-Lead  Shortness of breath on exertion. Stacey Page does feel that she gets out of breath more easily that she used to when she exercises. Stacey Page's shortness of breath appears to be obesity related and exercise induced. She has agreed to work on weight loss and gradually increase exercise to treat her exercise induced shortness of breath. Will continue to monitor closely.  Essential hypertension. Stacey Page is working on healthy weight loss and exercise to improve blood pressure control. We will watch for signs of hypotension as she continues her lifestyle modifications. She will continue her medications as prescribed.  Type 2 diabetes mellitus without complication, without long-term current use of insulin (Stacey Page). Good blood sugar control is important to decrease the likelihood of diabetic complications such as nephropathy, neuropathy, limb loss, blindness, coronary artery disease, and death. Intensive lifestyle modification including diet, exercise and weight loss are the first line of treatment for diabetes.  Stacey Page will continue her medications as prescribed. Microalbumin / creatinine urine ratio, Hemoglobin A1c, Insulin, random labs ordered.  OSA (obstructive sleep apnea). Intensive lifestyle modifications are the first line treatment for this issue. We discussed several lifestyle modifications today and she will continue to work on diet, exercise and weight loss efforts. We will continue to monitor. Orders and follow up as documented in patient record. Ria will continue to wear her CPAP.  Counseling  Sleep apnea is a condition in which breathing pauses or becomes shallow during sleep. This happens over and over during the night. This disrupts your sleep and keeps your body from getting the rest that it needs, which can cause tiredness and lack of energy (fatigue) during the day.  Sleep apnea treatment: If you were given a device to open your airway while you sleep, USE IT!  Sleep hygiene:   Limit or avoid alcohol, caffeinated beverages, and cigarettes, especially close to bedtime.   Do not eat a large meal or eat spicy foods right before bedtime. This can lead to digestive discomfort that can make it hard for you to sleep.  Keep a sleep diary to help you and your  health care provider figure out what could be causing your insomnia.  . Make your bedroom a dark, comfortable place where it is easy to fall asleep. ? Put up shades or blackout curtains to block light from outside. ? Use a white noise machine to block noise. ? Keep the temperature cool. . Limit screen use before bedtime. This includes: ? Watching TV. ? Using your smartphone, tablet, or computer. . Stick to a routine that includes going to bed and waking up at the same times every day and night. This can help you fall asleep faster. Consider making a quiet activity, such as reading, part of your nighttime routine. . Try to avoid taking naps during the day so that you sleep better at night. . Get out of bed if you are still  awake after 15 minutes of trying to sleep. Keep the lights down, but try reading or doing a quiet activity. When you feel sleepy, go back to bed.  Chronic renal impairment, stage 3b. Stacey Page will follow-up with her PCP as directed.  Other hyperlipidemia. Cardiovascular risk and specific lipid/LDL goals reviewed.  We discussed several lifestyle modifications today and Stacey Page will continue to work on diet, exercise and weight loss efforts. Orders and follow up as documented in patient record. She will continue her medications as prescribed.  Counseling Intensive lifestyle modifications are the first line treatment for this issue. . Dietary changes: Increase soluble fiber. Decrease simple carbohydrates. . Exercise changes: Moderate to vigorous-intensity aerobic activity 150 minutes per week if tolerated. . Lipid-lowering medications: see documented in medical record.  Vitamin D deficiency. Low Vitamin D level contributes to fatigue and are associated with obesity, breast, and colon cancer. Vitamin D 25 Hydroxy (Vit-D Deficiency, Fractures) level ordered.  Depression screening. Stacey Page had a positive depression screening. Depression is commonly associated with obesity and often results in emotional eating behaviors. We will monitor this closely and work on CBT to help improve the non-hunger eating patterns. Referral to Psychology may be required if no improvement is seen as she continues in our clinic.  Class 3 severe obesity with serious comorbidity and body mass index (BMI) of 50.0 to 59.9 in adult, unspecified obesity type ([place]).  Stacey Page is currently in the action stage of change and her goal is to continue with weight loss efforts. I recommend Stacey Page begin the structured treatment plan as follows:  She has agreed to the Category 3 Plan.  She will work on meal planning.  We independently reviewed labs results with the patient including BMP, CBC, and glucose.  Exercise goals:  Older adults should follow the adult guidelines. When older adults cannot meet the adult guidelines, they should be as physically active as their abilities and conditions will allow.    Behavioral modification strategies: increasing lean protein intake, decreasing simple carbohydrates, increasing vegetables, increasing water intake, decreasing eating out, no skipping meals, meal planning and cooking strategies, keeping healthy foods in the home and planning for success.  She was informed of the importance of frequent follow-up visits to maximize her success with intensive lifestyle modifications for her multiple health conditions. She was informed we would discuss her lab results at her next visit unless there is a critical issue that needs to be addressed sooner. Stacey Page agreed to keep her next visit at the agreed upon time to discuss these results.  Objective:   Blood pressure (!) 166/81, pulse 63, temperature 97.9 F (36.6 C), temperature source Oral, height 5\' 6"  (1.676 m), weight (!) 344 lb (  156 kg), SpO2 94 %. Body mass index is 55.52 kg/m.  EKG: Sinus  Rhythm with a rate of 60 BPM. Poor R-wave progression - nonspecific - consider old anterior infarct. Nonspecific T-abnormality. Abnormal.   Indirect Calorimeter completed today shows a VO2 of 273 and a REE of 1891.  Her calculated basal metabolic rate is 1497 thus her basal metabolic rate is worse than expected.  General: Cooperative, alert, well developed, in no acute distress. HEENT: Conjunctivae and lids unremarkable. Cardiovascular: Regular rhythm.  Lungs: Normal work of breathing. Neurologic: No focal deficits.   Lab Results  Component Value Date   CREATININE 1.57 (H) 12/24/2018   BUN 24 (H) 12/24/2018   NA 140 12/24/2018   K 4.0 12/24/2018   CL 106 12/24/2018   CO2 25 12/24/2018   Lab Results  Component Value Date   ALT 11 11/28/2018   AST 14 11/28/2018   ALKPHOS 67 11/28/2018   BILITOT 0.4 11/28/2018   Lab Results    Component Value Date   HGBA1C 6.1 11/28/2018   HGBA1C 6.2 01/16/2018   HGBA1C 5.9 01/19/2017   HGBA1C 5.5 03/07/2016   HGBA1C 5.6 12/15/2015   No results found for: INSULIN Lab Results  Component Value Date   TSH 0.51 11/28/2018   Lab Results  Component Value Date   CHOL 132 11/28/2018   HDL 52.60 11/28/2018   LDLCALC 64 11/28/2018   TRIG 78.0 11/28/2018   CHOLHDL 3 11/28/2018   Lab Results  Component Value Date   WBC 4.8 12/24/2018   HGB 12.7 12/24/2018   HCT 41.4 12/24/2018   MCV 95.8 12/24/2018   PLT 213 12/24/2018   No results found for: IRON, TIBC, FERRITIN  Obesity Behavioral Intervention Visit Documentation for Insurance:   Approximately 15 minutes were spent on the discussion below.  ASK: We discussed the diagnosis of obesity with Sybilla today and Sarahmarie agreed to give [name] permission to discuss obesity behavioral modification therapy today.  ASSESS: Laurell has the diagnosis of obesity and her BMI today is 55.6. Jazzie is in the action stage of change.   ADVISE: Tawn was educated on the multiple health risks of obesity as well as the benefit of weight loss to improve her health. She was advised of the need for long term treatment and the importance of lifestyle modifications to improve her current health and to decrease her risk of future health problems.  AGREE: Multiple dietary modification options and treatment options were discussed and Mayola agreed to follow the recommendations documented in the above note.  ARRANGE: Madison was educated on the importance of frequent visits to treat obesity as outlined per CMS and USPSTF guidelines and agreed to schedule her next follow up appointment today.  Attestation Statements:   Reviewed by clinician on day of visit: allergies, medications, problem list, medical history, surgical history, family history, social history, and previous encounter notes.  Migdalia Dk, am acting as  Location manager for [employer], DO   I have reviewed the above documentation for accuracy and completeness, and I agree with the above. Jearld Lesch, DO

## 2019-03-26 LAB — MICROALBUMIN / CREATININE URINE RATIO
Creatinine, Urine: 211.3 mg/dL
Microalb/Creat Ratio: 11 mg/g creat (ref 0–29)
Microalbumin, Urine: 22.8 ug/mL

## 2019-03-26 LAB — INSULIN, RANDOM: INSULIN: 8.8 u[IU]/mL (ref 2.6–24.9)

## 2019-03-26 LAB — HEMOGLOBIN A1C
Est. average glucose Bld gHb Est-mCnc: 126 mg/dL
Hgb A1c MFr Bld: 6 % — ABNORMAL HIGH (ref 4.8–5.6)

## 2019-03-26 LAB — VITAMIN D 25 HYDROXY (VIT D DEFICIENCY, FRACTURES): Vit D, 25-Hydroxy: 38.2 ng/mL (ref 30.0–100.0)

## 2019-03-31 ENCOUNTER — Ambulatory Visit: Payer: PPO | Attending: Internal Medicine

## 2019-03-31 ENCOUNTER — Other Ambulatory Visit: Payer: Self-pay | Admitting: Cardiology

## 2019-03-31 DIAGNOSIS — Z23 Encounter for immunization: Secondary | ICD-10-CM

## 2019-03-31 NOTE — Progress Notes (Signed)
   Covid-19 Vaccination Clinic  Name:  Stacey Page    MRN: [id number] DOB: 29-Oct-1950  03/31/2019  [name] was observed post Covid-19 immunization for 15 minutes without incidence. She was provided with Vaccine Information Sheet and instruction to access the V-Safe system.   [name] was instructed to call 911 with any severe reactions post vaccine: Marland Kitchen Difficulty breathing  . Swelling of your face and throat  . A fast heartbeat  . A bad rash all over your body  . Dizziness and weakness    Immunizations Administered    Name Date Dose VIS Date Route   Pfizer COVID-19 Vaccine 03/31/2019  8:21 AM 0.3 mL 01/18/2019 Intramuscular   Manufacturer: [place]   Lot: [id number]   [place]: [address]-3

## 2019-04-08 ENCOUNTER — Ambulatory Visit (INDEPENDENT_AMBULATORY_CARE_PROVIDER_SITE_OTHER): Payer: PPO | Admitting: Bariatrics

## 2019-04-08 ENCOUNTER — Encounter (INDEPENDENT_AMBULATORY_CARE_PROVIDER_SITE_OTHER): Payer: Self-pay | Admitting: Bariatrics

## 2019-04-08 ENCOUNTER — Other Ambulatory Visit: Payer: Self-pay

## 2019-04-08 VITALS — BP 127/60 | HR 61 | Temp 98.2°F | Ht 66.0 in | Wt 339.0 lb

## 2019-04-08 DIAGNOSIS — I10 Essential (primary) hypertension: Secondary | ICD-10-CM

## 2019-04-08 DIAGNOSIS — E66813 Obesity, class 3: Secondary | ICD-10-CM

## 2019-04-08 DIAGNOSIS — E559 Vitamin D deficiency, unspecified: Secondary | ICD-10-CM | POA: Diagnosis not present

## 2019-04-08 DIAGNOSIS — Z6841 Body Mass Index (BMI) 40.0 and over, adult: Secondary | ICD-10-CM | POA: Diagnosis not present

## 2019-04-08 DIAGNOSIS — E1151 Type 2 diabetes mellitus with diabetic peripheral angiopathy without gangrene: Secondary | ICD-10-CM

## 2019-04-08 MED ORDER — VITAMIN D (ERGOCALCIFEROL) 1.25 MG (50000 UNIT) PO CAPS: 50000.0000 [IU] | ORAL_CAPSULE | ORAL | 0 refills | Status: DC

## 2019-04-08 NOTE — Progress Notes (Signed)
Chief Complaint:   OBESITY Stacey Page is here to discuss her progress with her obesity treatment plan along with follow-up of her obesity related diagnoses. Stacey Page is on the Category 3 Plan and states she is following her eating plan approximately 50% of the time. Stacey Page states she is exercising 0 minutes 0 times per week.  Today's visit was #: 2 Starting weight: 344 lbs Starting date: 03/25/2019 Today's weight: 339 lbs Today's date: 04/08/2019 Total lbs lost to date: 5 Total lbs lost since last in-office visit: 5  Interim History: Stacey Page is down 5 lbs. She feels like she is doing well.   Subjective:   Type 2 diabetes mellitus with diabetic peripheral angiopathy without gangrene, without long-term current use of insulin (Stacey Page). Stacey Page is taking Jardiance. Diabetes is well controlled.  Lab Results  Component Value Date   HGBA1C 6.0 (H) 03/25/2019   HGBA1C 6.1 11/28/2018   HGBA1C 6.2 01/16/2018   Lab Results  Component Value Date   MICROALBUR 1.9 01/16/2018   LDLCALC 64 11/28/2018   CREATININE 1.57 (H) 12/24/2018   Lab Results  Component Value Date   INSULIN 8.8 03/25/2019   Vitamin D deficiency. Last Vitamin D 38.2 on 03/25/2019.  Essential hypertension. Blood pressure is well controlled at 127/60.  BP Readings from Last 3 Encounters:  04/08/19 127/60  03/25/19 (!) 166/81  12/28/18 118/69   Lab Results  Component Value Date   CREATININE 1.57 (H) 12/24/2018   CREATININE 1.18 11/28/2018   CREATININE 1.34 (H) 01/16/2018   Assessment/Plan:   Type 2 diabetes mellitus with diabetic peripheral angiopathy without gangrene, without long-term current use of insulin ([name]). Good blood sugar control is important to decrease the likelihood of diabetic complications such as nephropathy, neuropathy, limb loss, blindness, coronary artery disease, and death. Intensive lifestyle modification including diet, exercise and weight loss are the first line of  treatment for diabetes. Stacey Page will continue medications as directed.  Vitamin D deficiency. Low Vitamin D level contributes to fatigue and are associated with obesity, breast, and colon cancer. She was given a prescription for Vitamin D, Ergocalciferol, (DRISDOL) 1.25 MG (50000 UNIT) CAPS capsule every week #4 with 0 refills and will follow-up for routine testing of Vitamin D, at least 2-3 times per year to avoid over-replacement.     Essential hypertension. Stacey Page is working on healthy weight loss and exercise to improve blood pressure control. We will watch for signs of hypotension as she continues her lifestyle modifications. Stacey Page will continue her medications as directed.  Class 3 severe obesity with serious comorbidity and body mass index (BMI) of 50.0 to 59.9 in adult, unspecified obesity type ([name]).  Stacey Page is currently in the action stage of change. As such, her goal is to continue with weight loss efforts. She has agreed to the Category 3 Plan with additional lunch and dinner options.   She will work on meal planning and intentional eating.  Handout given on "Eating Out."  Exercise goals: Stacey Page will walk as tolerated.   Behavioral modification strategies: increasing lean protein intake, decreasing simple carbohydrates, increasing vegetables, increasing water intake, decreasing eating out, no skipping meals, meal planning and cooking strategies, keeping healthy foods in the home and planning for success.  Stacey Page has agreed to follow-up with our clinic in 2 weeks. She was informed of the importance of frequent follow-up visits to maximize her success with intensive lifestyle modifications for her multiple health conditions.   Objective:   Blood pressure 127/60, pulse 61, temperature  98.2 F (36.8 C), height 5\' 6"  (1.676 m), weight (!) 339 lb (153.8 kg), SpO2 95 %. Body mass index is 54.72 kg/m.  General: Cooperative, alert, well developed, in no acute distress.  HEENT: Conjunctivae and lids unremarkable. Cardiovascular: Regular rhythm.  Lungs: Normal work of breathing. Neurologic: No focal deficits.   Lab Results  Component Value Date   CREATININE 1.57 (H) 12/24/2018   BUN 24 (H) 12/24/2018   NA 140 12/24/2018   K 4.0 12/24/2018   CL 106 12/24/2018   CO2 25 12/24/2018   Lab Results  Component Value Date   ALT 11 11/28/2018   AST 14 11/28/2018   ALKPHOS 67 11/28/2018   BILITOT 0.4 11/28/2018   Lab Results  Component Value Date   HGBA1C 6.0 (H) 03/25/2019   HGBA1C 6.1 11/28/2018   HGBA1C 6.2 01/16/2018   HGBA1C 5.9 01/19/2017   HGBA1C 5.5 03/07/2016   Lab Results  Component Value Date   INSULIN 8.8 03/25/2019   Lab Results  Component Value Date   TSH 0.51 11/28/2018   Lab Results  Component Value Date   CHOL 132 11/28/2018   HDL 52.60 11/28/2018   LDLCALC 64 11/28/2018   TRIG 78.0 11/28/2018   CHOLHDL 3 11/28/2018   Lab Results  Component Value Date   WBC 4.8 12/24/2018   HGB 12.7 12/24/2018   HCT 41.4 12/24/2018   MCV 95.8 12/24/2018   PLT 213 12/24/2018   No results found for: IRON, TIBC, FERRITIN  Obesity Behavioral Intervention Documentation for Insurance:   Approximately 15 minutes were spent on the discussion below.  ASK: We discussed the diagnosis of obesity with Stacey Page today and Stacey Page agreed to give [name] permission to discuss obesity behavioral modification therapy today.  ASSESS: Stacey Page has the diagnosis of obesity and her BMI today is 54.7. Stacey Page is in the action stage of change.   ADVISE: Stacey Page was educated on the multiple health risks of obesity as well as the benefit of weight loss to improve her health. She was advised of the need for long term treatment and the importance of lifestyle modifications to improve her current health and to decrease her risk of future health problems.  AGREE: Multiple dietary modification options and treatment options were discussed and Stacey Page  agreed to follow the recommendations documented in the above note.  ARRANGE: Stacey Page was educated on the importance of frequent visits to treat obesity as outlined per CMS and USPSTF guidelines and agreed to schedule her next follow up appointment today.  Attestation Statements:   Reviewed by clinician on day of visit: allergies, medications, problem list, medical history, surgical history, family history, social history, and previous encounter notes.  Migdalia Dk, am acting as Location manager for [employer], DO   I have reviewed the above documentation for accuracy and completeness, and I agree with the above. Jearld Lesch, DO

## 2019-04-09 ENCOUNTER — Encounter (INDEPENDENT_AMBULATORY_CARE_PROVIDER_SITE_OTHER): Payer: Self-pay | Admitting: Bariatrics

## 2019-04-12 ENCOUNTER — Other Ambulatory Visit: Payer: Self-pay

## 2019-04-12 ENCOUNTER — Ambulatory Visit: Payer: PPO | Admitting: Orthotics

## 2019-04-12 DIAGNOSIS — M2041 Other hammer toe(s) (acquired), right foot: Secondary | ICD-10-CM | POA: Diagnosis not present

## 2019-04-12 DIAGNOSIS — M2141 Flat foot [pes planus] (acquired), right foot: Secondary | ICD-10-CM | POA: Diagnosis not present

## 2019-04-12 DIAGNOSIS — M2142 Flat foot [pes planus] (acquired), left foot: Secondary | ICD-10-CM | POA: Diagnosis not present

## 2019-04-12 DIAGNOSIS — E1142 Type 2 diabetes mellitus with diabetic polyneuropathy: Secondary | ICD-10-CM | POA: Diagnosis not present

## 2019-04-12 DIAGNOSIS — M2042 Other hammer toe(s) (acquired), left foot: Secondary | ICD-10-CM | POA: Diagnosis not present

## 2019-04-23 ENCOUNTER — Ambulatory Visit: Payer: PPO | Attending: Internal Medicine

## 2019-04-23 DIAGNOSIS — Z23 Encounter for immunization: Secondary | ICD-10-CM

## 2019-04-23 NOTE — Progress Notes (Signed)
   Covid-19 Vaccination Clinic  Name:  Jael Waldorf    MRN: [id number] DOB: 1950-05-02  04/23/2019  Ms. Kinoshita was observed post Covid-19 immunization for 15 minutes without incident. She was provided with Vaccine Information Sheet and instruction to access the V-Safe system.   Ms. Notaro was instructed to call 911 with any severe reactions post vaccine: Marland Kitchen Difficulty breathing  . Swelling of face and throat  . A fast heartbeat  . A bad rash all over body  . Dizziness and weakness   Immunizations Administered    Name Date Dose VIS Date Route   Pfizer COVID-19 Vaccine 04/23/2019  2:35 PM 0.3 mL 01/18/2019 Intramuscular   Manufacturer: [place]   Lot: [id number]   [place]: [id number]

## 2019-04-25 ENCOUNTER — Ambulatory Visit (INDEPENDENT_AMBULATORY_CARE_PROVIDER_SITE_OTHER): Payer: PPO | Admitting: Bariatrics

## 2019-04-25 ENCOUNTER — Other Ambulatory Visit: Payer: Self-pay

## 2019-04-25 ENCOUNTER — Encounter (INDEPENDENT_AMBULATORY_CARE_PROVIDER_SITE_OTHER): Payer: Self-pay | Admitting: Bariatrics

## 2019-04-25 VITALS — BP 137/67 | HR 52 | Temp 98.1°F | Ht 66.0 in | Wt 335.0 lb

## 2019-04-25 DIAGNOSIS — E1151 Type 2 diabetes mellitus with diabetic peripheral angiopathy without gangrene: Secondary | ICD-10-CM

## 2019-04-25 DIAGNOSIS — E7849 Other hyperlipidemia: Secondary | ICD-10-CM

## 2019-04-25 DIAGNOSIS — Z6841 Body Mass Index (BMI) 40.0 and over, adult: Secondary | ICD-10-CM | POA: Diagnosis not present

## 2019-04-25 NOTE — Progress Notes (Signed)
Chief Complaint:   OBESITY Stacey Page is here to discuss her progress with her obesity treatment plan along with follow-up of her obesity related diagnoses. Stacey Page is on the Category 3 Plan with lunch options and states she is following her eating plan approximately 90% of the time. Stacey Page states she is walking 5 minutes 7 times per week.  Today's visit was #: 3 Starting weight: 344 lbs Starting date: 03/25/2019 Today's weight: 335 lbs Today's date: 04/25/2019 Total lbs lost to date: 9 Total lbs lost since last in-office visit: 4  Interim History: Stacey Page is down 4 lbs and doing well overall. She is walking more. She reports doing well with her water intake.  Subjective:   Type 2 diabetes mellitus with diabetic peripheral angiopathy without gangrene, without long-term current use of insulin ([place]). Stacey Page is taking Januvia.  Lab Results  Component Value Date   HGBA1C 6.0 (H) 03/25/2019   HGBA1C 6.1 11/28/2018   HGBA1C 6.2 01/16/2018   Lab Results  Component Value Date   MICROALBUR 1.9 01/16/2018   LDLCALC 64 11/28/2018   CREATININE 1.57 (H) 12/24/2018   Lab Results  Component Value Date   INSULIN 8.8 03/25/2019   Other hyperlipidemia. Stacey Page is taking Lipitor.   Lab Results  Component Value Date   CHOL 132 11/28/2018   HDL 52.60 11/28/2018   LDLCALC 64 11/28/2018   TRIG 78.0 11/28/2018   CHOLHDL 3 11/28/2018   Lab Results  Component Value Date   ALT 11 11/28/2018   AST 14 11/28/2018   ALKPHOS 67 11/28/2018   BILITOT 0.4 11/28/2018   The 10-year ASCVD risk score Stacey Page DC Jr., et al., 2013) is: 18.5%   Values used to calculate the score:     Age: 51 years     Sex: Female     Is Non-Hispanic African American: Yes     Diabetic: Yes     Tobacco smoker: No     Systolic Blood Pressure: 759 mmHg     Is BP treated: Yes     HDL Cholesterol: 52.6 mg/dL     Total Cholesterol: 132 mg/dL  Assessment/Plan:   Type 2 diabetes mellitus with  diabetic peripheral angiopathy without gangrene, without long-term current use of insulin ([place]). Good blood sugar control is important to decrease the likelihood of diabetic complications such as nephropathy, neuropathy, limb loss, blindness, coronary artery disease, and death. Intensive lifestyle modification including diet, exercise and weight loss are the first line of treatment for diabetes. Stacey Page will decrease carbohydrates and increase healthy fats and protein. She will continue walking.  Other hyperlipidemia. Cardiovascular risk and specific lipid/LDL goals reviewed.  We discussed several lifestyle modifications today and Stacey Page will continue to work on diet, exercise and weight loss efforts. Orders and follow up as documented in patient record. She will continue her medication as directed.  Counseling Intensive lifestyle modifications are the first line treatment for this issue. . Dietary changes: Increase soluble fiber. Decrease simple carbohydrates. . Exercise changes: Moderate to vigorous-intensity aerobic activity 150 minutes per week if tolerated. . Lipid-lowering medications: see documented in medical record.  Class 3 severe obesity with serious comorbidity and body mass index (BMI) of 50.0 to 59.9 in adult, unspecified obesity type ([place]).  Stacey Page is currently in the action stage of change. As such, her goal is to continue with weight loss efforts. She has agreed to the Category 3 Plan with additional lunch options.   She will work on meal planning, mindful eating,  and will read labels. She was given handout on [employer].  Exercise goals: Older adults should follow the adult guidelines. When older adults cannot meet the adult guidelines, they should be as physically active as their abilities and conditions will allow.   Behavioral modification strategies: increasing lean protein intake, decreasing simple carbohydrates, increasing vegetables, increasing water intake,  decreasing eating out, no skipping meals, meal planning and cooking strategies, keeping healthy foods in the home and planning for success.  Stacey Page has agreed to follow-up with our clinic in 2 weeks. She was informed of the importance of frequent follow-up visits to maximize her success with intensive lifestyle modifications for her multiple health conditions.   Objective:   Blood pressure 137/67, pulse (!) 52, temperature 98.1 F (36.7 C), height 5\' 6"  (1.676 m), weight (!) 335 lb (152 kg), SpO2 98 %. Body mass index is 54.07 kg/m.  General: Cooperative, alert, well developed, in no acute distress. HEENT: Conjunctivae and lids unremarkable. Cardiovascular: Regular rhythm.  Lungs: Normal work of breathing. Neurologic: No focal deficits.   Lab Results  Component Value Date   CREATININE 1.57 (H) 12/24/2018   BUN 24 (H) 12/24/2018   NA 140 12/24/2018   K 4.0 12/24/2018   CL 106 12/24/2018   CO2 25 12/24/2018   Lab Results  Component Value Date   ALT 11 11/28/2018   AST 14 11/28/2018   ALKPHOS 67 11/28/2018   BILITOT 0.4 11/28/2018   Lab Results  Component Value Date   HGBA1C 6.0 (H) 03/25/2019   HGBA1C 6.1 11/28/2018   HGBA1C 6.2 01/16/2018   HGBA1C 5.9 01/19/2017   HGBA1C 5.5 03/07/2016   Lab Results  Component Value Date   INSULIN 8.8 03/25/2019   Lab Results  Component Value Date   TSH 0.51 11/28/2018   Lab Results  Component Value Date   CHOL 132 11/28/2018   HDL 52.60 11/28/2018   LDLCALC 64 11/28/2018   TRIG 78.0 11/28/2018   CHOLHDL 3 11/28/2018   Lab Results  Component Value Date   WBC 4.8 12/24/2018   HGB 12.7 12/24/2018   HCT 41.4 12/24/2018   MCV 95.8 12/24/2018   PLT 213 12/24/2018   No results found for: IRON, TIBC, FERRITIN  Obesity Behavioral Intervention Documentation for Insurance:   Approximately 15 minutes were spent on the discussion below.  ASK: We discussed the diagnosis of obesity with Stacey Page today and Stacey Page agreed  to give [name] permission to discuss obesity behavioral modification therapy today.  ASSESS: Stacey Page has the diagnosis of obesity and her BMI today is 54.2. Stacey Page is in the action stage of change.   ADVISE: Stacey Page was educated on the multiple health risks of obesity as well as the benefit of weight loss to improve her health. She was advised of the need for long term treatment and the importance of lifestyle modifications to improve her current health and to decrease her risk of future health problems.  AGREE: Multiple dietary modification options and treatment options were discussed and Zoejane agreed to follow the recommendations documented in the above note.  ARRANGE: Keiva was educated on the importance of frequent visits to treat obesity as outlined per CMS and USPSTF guidelines and agreed to schedule her next follow up appointment today.  Attestation Statements:   Reviewed by clinician on day of visit: allergies, medications, problem list, medical history, surgical history, family history, social history, and previous encounter notes.  IMichaelene Song, am acting as Location manager for [employer], DO  I have reviewed the above documentation for accuracy and completeness, and I agree with the above. Jearld Lesch, DO

## 2019-05-09 ENCOUNTER — Telehealth (INDEPENDENT_AMBULATORY_CARE_PROVIDER_SITE_OTHER): Payer: Self-pay | Admitting: Bariatrics

## 2019-05-09 ENCOUNTER — Ambulatory Visit (INDEPENDENT_AMBULATORY_CARE_PROVIDER_SITE_OTHER): Payer: PPO | Admitting: Bariatrics

## 2019-05-09 ENCOUNTER — Encounter (INDEPENDENT_AMBULATORY_CARE_PROVIDER_SITE_OTHER): Payer: Self-pay | Admitting: Bariatrics

## 2019-05-09 ENCOUNTER — Other Ambulatory Visit: Payer: Self-pay

## 2019-05-09 VITALS — BP 137/70 | HR 63 | Temp 97.5°F | Ht 66.0 in | Wt 332.0 lb

## 2019-05-09 DIAGNOSIS — Z6841 Body Mass Index (BMI) 40.0 and over, adult: Secondary | ICD-10-CM

## 2019-05-09 DIAGNOSIS — E559 Vitamin D deficiency, unspecified: Secondary | ICD-10-CM | POA: Diagnosis not present

## 2019-05-09 DIAGNOSIS — E1151 Type 2 diabetes mellitus with diabetic peripheral angiopathy without gangrene: Secondary | ICD-10-CM | POA: Diagnosis not present

## 2019-05-09 MED ORDER — VITAMIN D (ERGOCALCIFEROL) 1.25 MG (50000 UNIT) PO CAPS: 50000.0000 [IU] | ORAL_CAPSULE | ORAL | 0 refills | Status: DC

## 2019-05-09 NOTE — Progress Notes (Signed)
Chief Complaint:   OBESITY Stacey Page is here to discuss her progress with her obesity treatment plan along with follow-up of her obesity related diagnoses. Stacey Page is on the Category 3 Plan and states she is following her eating plan approximately 0% of the time. Stacey Page states she is walking 10 minutes 5 times per week.  Today's visit was #: 4 Starting weight: 344 lbs Starting date: 03/25/2019 Today's weight: 332 lbs Today's date: 05/09/2019 Total lbs lost to date: 12 Total lbs lost since last in-office visit: 3  Interim History: Stacey Page is down 3 lbs and doing well overall. She has struggled with the plan over the last few weeks (tooth infection).  Subjective:   Vitamin D deficiency. No nausea, vomiting, or muscle weakness. Last Vitamin D 38.2 on 03/25/2019.  Type 2 diabetes mellitus with diabetic peripheral angiopathy without gangrene, without long-term current use of insulin ([place]). Stacey Page is taking Januvia.  She denies any lows.  Lab Results  Component Value Date   HGBA1C 6.0 (H) 03/25/2019   HGBA1C 6.1 11/28/2018   HGBA1C 6.2 01/16/2018   Lab Results  Component Value Date   MICROALBUR 1.9 01/16/2018   LDLCALC 64 11/28/2018   CREATININE 1.57 (H) 12/24/2018   Lab Results  Component Value Date   INSULIN 8.8 03/25/2019   Assessment/Plan:   Vitamin D deficiency. Low Vitamin D level contributes to fatigue and are associated with obesity, breast, and colon cancer. She was given a prescription for Vitamin D, Ergocalciferol, (DRISDOL) 1.25 MG (50000 UNIT) CAPS capsule every week #4 with 0 refills and will follow-up for routine testing of Vitamin D, at least 2-3 times per year to avoid over-replacement.     Type 2 diabetes mellitus with diabetic peripheral angiopathy without gangrene, without long-term current use of insulin ([place]). Good blood sugar control is important to decrease the likelihood of diabetic complications such as nephropathy, neuropathy, limb  loss, blindness, coronary artery disease, and death. Intensive lifestyle modification including diet, exercise and weight loss are the first line of treatment for diabetes. Ernestina will continue Januvia as directed.  Class 3 severe obesity with serious comorbidity and body mass index (BMI) of 50.0 to 59.9 in adult, unspecified obesity type ([place]).  Stacey Page is currently in the action stage of change. As such, her goal is to continue with weight loss efforts. She has agreed to the Category 3 Plan.   She will work on meal planning and mindful eating.  Exercise goals: Stacey Page will consider going back to the gym.  Behavioral modification strategies: increasing lean protein intake, decreasing simple carbohydrates, increasing vegetables, increasing water intake, decreasing eating out, no skipping meals, meal planning and cooking strategies, keeping healthy foods in the home and keeping a strict food journal.  Stacey Page has agreed to follow-up with our clinic in 2 weeks. She was informed of the importance of frequent follow-up visits to maximize her success with intensive lifestyle modifications for her multiple health conditions.   Objective:   Blood pressure 137/70, pulse 63, temperature (!) 97.5 F (36.4 C), height 5\' 6"  (1.676 m), weight (!) 332 lb (150.6 kg), SpO2 96 %. Body mass index is 53.59 kg/m.  General: Cooperative, alert, well developed, in no acute distress. HEENT: Conjunctivae and lids unremarkable. Cardiovascular: Regular rhythm.  Lungs: Normal work of breathing. Neurologic: No focal deficits.   Lab Results  Component Value Date   CREATININE 1.57 (H) 12/24/2018   BUN 24 (H) 12/24/2018   NA 140 12/24/2018   K 4.0  12/24/2018   CL 106 12/24/2018   CO2 25 12/24/2018   Lab Results  Component Value Date   ALT 11 11/28/2018   AST 14 11/28/2018   ALKPHOS 67 11/28/2018   BILITOT 0.4 11/28/2018   Lab Results  Component Value Date   HGBA1C 6.0 (H) 03/25/2019   HGBA1C  6.1 11/28/2018   HGBA1C 6.2 01/16/2018   HGBA1C 5.9 01/19/2017   HGBA1C 5.5 03/07/2016   Lab Results  Component Value Date   INSULIN 8.8 03/25/2019   Lab Results  Component Value Date   TSH 0.51 11/28/2018   Lab Results  Component Value Date   CHOL 132 11/28/2018   HDL 52.60 11/28/2018   LDLCALC 64 11/28/2018   TRIG 78.0 11/28/2018   CHOLHDL 3 11/28/2018   Lab Results  Component Value Date   WBC 4.8 12/24/2018   HGB 12.7 12/24/2018   HCT 41.4 12/24/2018   MCV 95.8 12/24/2018   PLT 213 12/24/2018   No results found for: IRON, TIBC, FERRITIN  Obesity Behavioral Intervention Documentation for Insurance:   Approximately 15 minutes were spent on the discussion below.  ASK: We discussed the diagnosis of obesity with Stacey Page today and Stacey Page agreed to give Stacey Page permission to discuss obesity behavioral modification therapy today.  ASSESS: Stacey Page has the diagnosis of obesity and her BMI today is 53.6. Stacey Page is in the action stage of change.   ADVISE: Stacey Page was educated on the multiple health risks of obesity as well as the benefit of weight loss to improve her health. She was advised of the need for long term treatment and the importance of lifestyle modifications to improve her current health and to decrease her risk of future health problems.  AGREE: Multiple dietary modification options and treatment options were discussed and Stacey Page agreed to follow the recommendations documented in the above note.  ARRANGE: Stacey Page was educated on the importance of frequent visits to treat obesity as outlined per CMS and USPSTF guidelines and agreed to schedule her next follow up appointment today.  Attestation Statements:   Reviewed by clinician on day of visit: allergies, medications, problem list, medical history, surgical history, family history, social history, and previous encounter notes.  Stacey Page, am acting as Location manager for [employer], DO    I have reviewed the above documentation for accuracy and completeness, and I agree with the above. Jearld Lesch, DO

## 2019-05-13 ENCOUNTER — Encounter (INDEPENDENT_AMBULATORY_CARE_PROVIDER_SITE_OTHER): Payer: Self-pay | Admitting: Bariatrics

## 2019-05-15 ENCOUNTER — Telehealth: Payer: Self-pay | Admitting: Nurse Practitioner

## 2019-05-15 NOTE — Progress Notes (Signed)
  Chronic Care Management   Note  05/15/2019 Name: Stacey Page MRN: [id number] DOB: [birth year]  Stacey Page is a 69 y.o. year old female who is a primary care patient of [name], [name], NP. I reached out to [name] by phone today in response to a referral sent by Ms. Marisol Monday's PCP, [name], [name], NP.   Ms. Waszak was given information about Chronic Care Management services today including:  1. CCM service includes personalized support from designated clinical staff supervised by her physician, including individualized plan of care and coordination with other care providers 2. 24/7 contact phone numbers for assistance for urgent and routine care needs. 3. Service will only be billed when office clinical staff spend 20 minutes or more in a month to coordinate care. 4. Only one practitioner may furnish and bill the service in a calendar month. 5. The patient may stop CCM services at any time (effective at the end of the month) by phone call to the office staff.   Patient agreed to services and verbal consent obtained.   Follow up plan:   Earney [place] Upstream Scheduler

## 2019-05-15 NOTE — Chronic Care Management (AMB) (Signed)
Chronic Care Management Pharmacy  Name: Stacey Page  MRN: [id number] DOB: 06/22/1950  Chief Complaint/ HPI  Stacey Page,  69 y.o. , female presents for their Initial CCM visit with the clinical pharmacist via telephone due to COVID-19 Pandemic.  PCP : Flossie Buffy, NP  Their chronic conditions include: hypertension, diastolic heart failure, type 2 diabetes, chronic kidney disease, depression  Office Visits:  03/01/19: Patient presented to Dr. Bryan Lemma for diabetic foot exam. No medication changes made.  02/28/19: Video Visit with Wilfred Lacy for type 2 diabetes. No medication changes noted.   Consult Visits: 05/09/19: Patient presented to Dr. Owens Shark [facility]) for obesity follow-up. Vitamin D 38.2, refill on Vit D2 50,000 unit weekly provided.  04/25/19: Patient presented to Dr. Owens Shark [facility]) for obesity follow-up. 04/08/19: Patient presented to Dr. Owens Shark [facility]) for obesity follow-up. Patient started on Vitamin D 50,000 units weekly  03/25/19: Patient presented to Dr. Owens Shark [facility]) for obesity follow-up. BP in clinic 166/81. PHQ-9 Score: 20.   Medications: Outpatient Encounter Medications as of 05/16/2019  Medication Sig  . albuterol (PROAIR HFA) 108 (90 Base) MCG/ACT inhaler Inhale 2 puffs into the lungs every 6 (six) hours as needed for wheezing or shortness of breath.  Marland Kitchen aspirin 81 MG tablet Take 1 tablet (81 mg total) by mouth daily with breakfast.  . atorvastatin (LIPITOR) 20 MG tablet Take 1 tablet (20 mg total) by mouth daily at 6 PM.  . Calcium Carbonate-Vitamin D (CALCIUM 600+D) 600-400 MG-UNIT tablet Take 1 tablet by mouth daily.  . cholecalciferol (VITAMIN D) 1000 units tablet Take 1,000 Units by mouth daily.  . ferrous sulfate (FEROSUL) 325 (65 FE) MG tablet Take 325 mg by mouth daily with breakfast.  . furosemide (LASIX) 40 MG tablet TAKE 1 TAB DAILY, TAKE AN ADDITIONAL TAB AT 2 PM IF INCREASED SWELLING AND/OR SHORTNESS OF BREATH  .  lisinopril (ZESTRIL) 20 MG tablet Take 1 tablet (20 mg total) by mouth daily.  . mometasone (NASONEX) 50 MCG/ACT nasal spray Place 2 sprays into the nose daily.  . montelukast (SINGULAIR) 10 MG tablet Take 1 tablet (10 mg total) by mouth at bedtime.  . sitaGLIPtin (JANUVIA) 100 MG tablet Take 1 tablet (100 mg total) by mouth daily.  . vitamin C (ASCORBIC ACID) 500 MG tablet Take 500 mg by mouth daily.   . Vitamin D, Ergocalciferol, (DRISDOL) 1.25 MG (50000 UNIT) CAPS capsule Take 1 capsule (50,000 Units total) by mouth every 7 (seven) days.  Marland Kitchen acetaminophen (TYLENOL) 500 MG tablet Take 1 tablet (500 mg total) by mouth every 6 (six) hours as needed. (Patient not taking: Reported on 05/16/2019)  . carvedilol (COREG) 25 MG tablet Take 1 tablet (25 mg total) by mouth 2 (two) times daily.  . Clobetasol Prop Emollient Base (CLOBETASOL PROPIONATE E) 0.05 % emollient cream Apply 1 application topically daily as needed (skin irritations). (Patient not taking: Reported on 05/16/2019)   No facility-administered encounter medications on file as of 05/16/2019.     Current Diagnosis/Assessment:  Goals Addressed            This Visit's Progress   . Chronic Care Management       CARE PLAN ENTRY  Current Barriers:  . Chronic Disease Management support, education, and care coordination needs related to hypertension, diastolic heart failure, type 2 diabetes, chronic kidney disease, depression  Clinical Goal(s): Over the next 180 days, patient will:  . Work with the care management team to address educational, disease management, and care coordination  needs  . Begin or continue self health monitoring activities as directed today  . Call provider office for new or worsened signs and symptoms  . Call care management team with questions or concerns . Maintain A1c less than 7% . Maintain blood pressure less than 140/90 . Maintain LDL (bad cholesterol) less than 70    Interventions:  . Evaluation of current  treatment plans and patient's adherence to plan as established by provider . Assessed patient understanding of disease states . Assessed patient's education and care coordination needs . Provided disease specific education to patient  . Recommend stopping your daily Vitamin D supplement while you are taking the weekly vitamin D prescription.  . Recommend purchasing a blood pressure monitor and check your blood pressure once weekly in the morning. Omron is a good brand and I would suggest getting an arm cuff.  . Verbal consent obtained for [facility] enhanced pharmacy services (medication synchronization, adherence packaging, delivery coordination). A medication sync plan was created to allow patient to get all medications delivered once every 30 to 90 days per patient preference. Patient understands they have freedom to choose pharmacy and clinical pharmacist will coordinate care between all prescribers and [facility].   Telephone follow up appointment with pharmacy team member scheduled for: 11/11/19 at 9:00 AM     . Increase physical activity   Not on track    Begin silver sneakers at least 2x/ week. Walk to mailbox everyday.       Diabetes   Recent Relevant Labs: Lab Results  Component Value Date/Time   HGBA1C 6.0 (H) 03/25/2019 01:55 PM   HGBA1C 6.1 11/28/2018 10:42 AM   MICROALBUR 1.9 01/16/2018 11:17 AM     Checking BG: Never Does not have glucose monitor   Recent FBG Readings: n/a Recent pre-meal BG readings: n/a Recent 2hr PP BG readings:  n/a Recent HS BG readings: n/a  Patient has failed these meds in past: n/a Patient is currently controlled on the following medications:   Januvia 100 mg daily (10 years)- work  A1c goal <7%   Last diabetic Foot exam:  Lab Results  Component Value Date/Time   HMDIABEYEEXA No Retinopathy 11/01/2018 12:00 AM    Last diabetic Eye exam:  Lab Results  Component Value Date/Time   HMDIABFOOTEX normal 09/10/2018 12:00  AM     We discussed: diet and exercise extensively. Eating mainly baked chicken or shrimp with vegetables and fruit. Wants to start going to the gym as she is not able to walk much. Plans to start going to the gym in ~2 weeks.   Plan  Continue current medications,  Heart Failure   Type: Diastolic  Last ejection fraction: 60-65% (Feb 2019) NYHA Class: II (slight limitation of activity) AHA HF Stage: B (Heart disease present - no symptoms present)  Patient has failed these meds in past: labetalol, metoprolol  Patient is currently controlled on the following medications:   Coreg 25 mg twice daily   Furosemide 40 daily, additional 40 mg PRN for swelling   Lisinopril 20 mg daily    We discussed diet and exercise extensively. Trying to watch salt intake.  Plan  Continue current medications   Hypertension   Office blood pressures are  BP Readings from Last 3 Encounters:  05/09/19 137/70  04/25/19 137/67  04/08/19 127/60    Patient has failed these meds in the past: n/a Patient is currently controlled on the following medications:   Carvedilol 25 mg twice daily  Furosemide 40 daily, additional 40 mg PRN for swelling   Lisinopril 20 mg daily   BP goal <140/90 Patient checks BP at home never. Does not have blood pressure monitor   Patient home BP readings are ranging: n/a  We discussed diet and exercise extensively  Plan  Continue current medications     Hyperlipidemia   Lipid Panel     Component Value Date/Time   CHOL 132 11/28/2018 1042   TRIG 78.0 11/28/2018 1042   HDL 52.60 11/28/2018 1042   CHOLHDL 3 11/28/2018 1042   VLDL 15.6 11/28/2018 1042   LDLCALC 64 11/28/2018 1042     The 10-year ASCVD risk score [name], et al., 2013) is: 18.5%   Values used to calculate the score:     Age: 33 years     Sex: Female     Is Non-Hispanic African American: Yes     Diabetic: Yes     Tobacco smoker: No     Systolic Blood Pressure: 294 mmHg     Is BP  treated: Yes     HDL Cholesterol: 52.6 mg/dL     Total Cholesterol: 132 mg/dL   Patient has failed these meds in past: n/a Patient is currently controlled on the following medications:   atorvastatin 20 mg daily  Aspirin 81 mg daily  LDL goal <70 We discussed:  diet and exercise extensively. Tolerating aspirin and atorvastatin well. Reported a little bleeding after a minor tooth procedure but otherwise denies unusual bruising/bleeding.  Plan  Continue current medications  Misc/OTC   Acetaminophen 500 mg q6hr PRN -severe headache, hasn't needed  ProAir HFA 198mcg/act 2 puff q6hr PRN -uses daughter's inhaler (can't afford) -seasonal asthma symptoms Calcium carbonate + Vit D 600-400 daily  Vitamin D 1000 units daily  Clobatesol 0.05% cream Ferrous sulfate 325 mg daily  Mometasone 50 mcg/act 2 spray daily  Montelukast 10 mg QHS  Vitamin C 500 units daily  Vitamin D2 50,000 units weekly  Plan  Recommended patient discontinue OTC vitamin D while taking ergocalciferol to minimize pill burden.   Vaccines   Reviewed and discussed patient's vaccination history.    Immunization History  Administered Date(s) Administered  . Fluad Quad(high Dose 65+) 11/28/2018  . Influenza Split 11/21/2016  . Influenza,inj,Quad PF,6+ Mos 01/12/2015, 11/19/2015  . PFIZER SARS-COV-2 Vaccination 03/31/2019, 04/23/2019  . Pneumococcal Conjugate-13 01/19/2017  . Pneumococcal Polysaccharide-23 01/12/2015    Plan  Recommended patient receive Shingrix  Medication Management   Pt uses [facility] for all medications. Uses a 7-day pillbox. She has had issues recently with [facility] in the past where they did not have her medications ready and another instance where the pharmacy did not inform her medications were ready.   Plan  Verbal consent obtained for [facility] enhanced pharmacy services (medication synchronization, adherence packaging, delivery coordination). A medication sync plan was  created to allow patient to get all medications delivered once every 30 to 90 days per patient preference. Patient understands they have freedom to choose pharmacy and clinical pharmacist will coordinate care between all prescribers and [facility].  Follow up: 6 months   Doristine Section [phone]

## 2019-05-16 ENCOUNTER — Ambulatory Visit: Payer: PPO

## 2019-05-16 ENCOUNTER — Telehealth (INDEPENDENT_AMBULATORY_CARE_PROVIDER_SITE_OTHER): Payer: Self-pay | Admitting: Bariatrics

## 2019-05-16 DIAGNOSIS — E1151 Type 2 diabetes mellitus with diabetic peripheral angiopathy without gangrene: Secondary | ICD-10-CM

## 2019-05-16 DIAGNOSIS — I110 Hypertensive heart disease with heart failure: Secondary | ICD-10-CM

## 2019-05-16 NOTE — Patient Instructions (Addendum)
Visit Information It was speaking with you today. Please let me know if you have any questions about our visit.  Goals Addressed            This Visit's Progress   . Chronic Care Management       CARE PLAN ENTRY  Current Barriers:  . Chronic Disease Management support, education, and care coordination needs related to hypertension, diastolic heart failure, type 2 diabetes, chronic kidney disease, depression  Clinical Goal(s): Over the next 180 days, patient will:  . Work with the care management team to address educational, disease management, and care coordination needs  . Begin or continue self health monitoring activities as directed today  . Call provider office for new or worsened signs and symptoms  . Call care management team with questions or concerns . Maintain A1c less than 7% . Maintain blood pressure less than 140/90 . Maintain LDL (bad cholesterol) less than 70    Interventions:  . Evaluation of current treatment plans and patient's adherence to plan as established by provider . Assessed patient understanding of disease states . Assessed patient's education and care coordination needs . Provided disease specific education to patient  . Recommend stopping your daily Vitamin D supplement while you are taking the weekly vitamin D prescription.  . Recommend purchasing a blood pressure monitor and check your blood pressure once weekly in the morning. Omron is a good brand and I would suggest getting an arm cuff.  . Verbal consent obtained for [facility] enhanced pharmacy services (medication synchronization, adherence packaging, delivery coordination). A medication sync plan was created to allow patient to get all medications delivered once every 30 to 90 days per patient preference. Patient understands they have freedom to choose pharmacy and clinical pharmacist will coordinate care between all prescribers and [facility].   Telephone follow up appointment  with pharmacy team member scheduled for: 11/11/19 at 9:00 AM     . Increase physical activity   Not on track    Begin silver sneakers at least 2x/ week. Walk to mailbox everyday.       Stacey Page was given information about Chronic Care Management services today including:  1. CCM service includes personalized support from designated clinical staff supervised by her physician, including individualized plan of care and coordination with other care providers 2. 24/7 contact phone numbers for assistance for urgent and routine care needs. 3. Standard insurance, coinsurance, copays and deductibles apply for chronic care management only during months in which we provide at least 20 minutes of these services. Most insurances cover these services at 100%, however patients may be responsible for any copay, coinsurance and/or deductible if applicable. This service may help you avoid the need for more expensive face-to-face services. 4. Only one practitioner may furnish and bill the service in a calendar month. 5. The patient may stop CCM services at any time (effective at the end of the month) by phone call to the office staff.  Patient agreed to services and verbal consent obtained.   The patient verbalized understanding of instructions provided today and agreed to receive a mailed copy of patient instruction and/or educational materials. Telephone follow up appointment with pharmacy team member scheduled for: 11/11/19   Doristine Section [phone]  DASH Eating Plan DASH stands for "Dietary Approaches to Stop Hypertension." The DASH eating plan is a healthy eating plan that has been shown to reduce high blood pressure (hypertension). It may also reduce your risk for type 2 diabetes, heart  disease, and stroke. The DASH eating plan may also help with weight loss. What are tips for following this plan?  General guidelines  Avoid eating more than 2,300 mg (milligrams) of salt (sodium) a day. If  you have hypertension, you may need to reduce your sodium intake to 1,500 mg a day.  Limit alcohol intake to no more than 1 drink a day for nonpregnant women and 2 drinks a day for men. One drink equals 12 oz of beer, 5 oz of wine, or 1 oz of hard liquor.  Work with your health care provider to maintain a healthy body weight or to lose weight. Ask what an ideal weight is for you.  Get at least 30 minutes of exercise that causes your heart to beat faster (aerobic exercise) most days of the week. Activities may include walking, swimming, or biking.  Work with your health care provider or diet and nutrition specialist (dietitian) to adjust your eating plan to your individual calorie needs. Reading food labels   Check food labels for the amount of sodium per serving. Choose foods with less than 5 percent of the Daily Value of sodium. Generally, foods with less than 300 mg of sodium per serving fit into this eating plan.  To find whole grains, look for the word "whole" as the first word in the ingredient list. Shopping  Buy products labeled as "low-sodium" or "no salt added."  Buy fresh foods. Avoid canned foods and premade or frozen meals. Cooking  Avoid adding salt when cooking. Use salt-free seasonings or herbs instead of table salt or sea salt. Check with your health care provider or pharmacist before using salt substitutes.  Do not fry foods. Cook foods using healthy methods such as baking, boiling, grilling, and broiling instead.  Cook with heart-healthy oils, such as olive, canola, soybean, or sunflower oil. Meal planning  Eat a balanced diet that includes: ? 5 or more servings of fruits and vegetables each day. At each meal, try to fill half of your plate with fruits and vegetables. ? Up to 6-8 servings of whole grains each day. ? Less than 6 oz of lean meat, poultry, or fish each day. A 3-oz serving of meat is about the same size as a deck of cards. One egg equals 1 oz. ? 2  servings of low-fat dairy each day. ? A serving of nuts, seeds, or beans 5 times each week. ? Heart-healthy fats. Healthy fats called Omega-3 fatty acids are found in foods such as flaxseeds and coldwater fish, like sardines, salmon, and mackerel.  Limit how much you eat of the following: ? Canned or prepackaged foods. ? Food that is high in trans fat, such as fried foods. ? Food that is high in saturated fat, such as fatty meat. ? Sweets, desserts, sugary drinks, and other foods with added sugar. ? Full-fat dairy products.  Do not salt foods before eating.  Try to eat at least 2 vegetarian meals each week.  Eat more home-cooked food and less restaurant, buffet, and fast food.  When eating at a restaurant, ask that your food be prepared with less salt or no salt, if possible. What foods are recommended? The items listed may not be a complete list. Talk with your dietitian about what dietary choices are best for you. Grains Whole-grain or whole-wheat bread. Whole-grain or whole-wheat pasta. Brown rice. [place] Morrow. Bulgur. Whole-grain and low-sodium cereals. Pita bread. Low-fat, low-sodium crackers. Whole-wheat flour tortillas. Vegetables Fresh or frozen vegetables (raw, steamed,  roasted, or grilled). Low-sodium or reduced-sodium tomato and vegetable juice. Low-sodium or reduced-sodium tomato sauce and tomato paste. Low-sodium or reduced-sodium canned vegetables. Fruits All fresh, dried, or frozen fruit. Canned fruit in natural juice (without added sugar). Meat and other protein foods Skinless chicken or [place]. Ground chicken or [place]. Pork with fat trimmed off. Fish and seafood. Egg whites. Dried beans, peas, or lentils. Unsalted nuts, nut butters, and seeds. Unsalted canned beans. Lean cuts of beef with fat trimmed off. Low-sodium, lean deli meat. Dairy Low-fat (1%) or fat-free (skim) milk. Fat-free, low-fat, or reduced-fat cheeses. Nonfat, low-sodium ricotta or cottage cheese.  Low-fat or nonfat yogurt. Low-fat, low-sodium cheese. Fats and oils Soft margarine without trans fats. Vegetable oil. Low-fat, reduced-fat, or light mayonnaise and salad dressings (reduced-sodium). Canola, safflower, olive, soybean, and sunflower oils. Avocado. Seasoning and other foods Herbs. Spices. Seasoning mixes without salt. Unsalted popcorn and pretzels. Fat-free sweets. What foods are not recommended? The items listed may not be a complete list. Talk with your dietitian about what dietary choices are best for you. Grains Baked goods made with fat, such as croissants, muffins, or some breads. Dry pasta or rice meal packs. Vegetables Creamed or fried vegetables. Vegetables in a cheese sauce. Regular canned vegetables (not low-sodium or reduced-sodium). Regular canned tomato sauce and paste (not low-sodium or reduced-sodium). Regular tomato and vegetable juice (not low-sodium or reduced-sodium). Angie Fava. Olives. Fruits Canned fruit in a light or heavy syrup. Fried fruit. Fruit in cream or butter sauce. Meat and other protein foods Fatty cuts of meat. Ribs. Fried meat. Berniece Salines. Sausage. Bologna and other processed lunch meats. Salami. Fatback. Hotdogs. Bratwurst. Salted nuts and seeds. Canned beans with added salt. Canned or smoked fish. Whole eggs or egg yolks. Chicken or [place] with skin. Dairy Whole or 2% milk, cream, and half-and-half. Whole or full-fat cream cheese. Whole-fat or sweetened yogurt. Full-fat cheese. Nondairy creamers. Whipped toppings. Processed cheese and cheese spreads. Fats and oils Butter. Stick margarine. Lard. Shortening. Ghee. Bacon fat. Tropical oils, such as coconut, palm kernel, or palm oil. Seasoning and other foods Salted popcorn and pretzels. Onion salt, garlic salt, seasoned salt, table salt, and sea salt. Worcestershire sauce. Tartar sauce. Barbecue sauce. Teriyaki sauce. Soy sauce, including reduced-sodium. Steak sauce. Canned and packaged gravies. Fish sauce.  Oyster sauce. Cocktail sauce. Horseradish that you find on the shelf. Ketchup. Mustard. Meat flavorings and tenderizers. Bouillon cubes. Hot sauce and Tabasco sauce. Premade or packaged marinades. Premade or packaged taco seasonings. Relishes. Regular salad dressings. Where to find more information:  National Heart, Lung, and [place]: https://wilson-eaton.com/  American Heart Association: www.heart.org Summary  The DASH eating plan is a healthy eating plan that has been shown to reduce high blood pressure (hypertension). It may also reduce your risk for type 2 diabetes, heart disease, and stroke.  With the DASH eating plan, you should limit salt (sodium) intake to 2,300 mg a day. If you have hypertension, you may need to reduce your sodium intake to 1,500 mg a day.  When on the DASH eating plan, aim to eat more fresh fruits and vegetables, whole grains, lean proteins, low-fat dairy, and heart-healthy fats.  Work with your health care provider or diet and nutrition specialist (dietitian) to adjust your eating plan to your individual calorie needs. This information is not intended to replace advice given to you by your health care provider. Make sure you discuss any questions you have with your health care provider. Document Revised: 01/06/2017 Document Reviewed: 01/18/2016 Elsevier Patient  Education  2020 [employer]  

## 2019-05-23 ENCOUNTER — Other Ambulatory Visit: Payer: Self-pay

## 2019-05-23 ENCOUNTER — Ambulatory Visit (INDEPENDENT_AMBULATORY_CARE_PROVIDER_SITE_OTHER): Payer: PPO | Admitting: Bariatrics

## 2019-05-23 ENCOUNTER — Encounter (INDEPENDENT_AMBULATORY_CARE_PROVIDER_SITE_OTHER): Payer: Self-pay | Admitting: Bariatrics

## 2019-05-23 VITALS — BP 128/81 | HR 55 | Temp 97.9°F | Ht 66.0 in | Wt 332.0 lb

## 2019-05-23 DIAGNOSIS — I10 Essential (primary) hypertension: Secondary | ICD-10-CM | POA: Diagnosis not present

## 2019-05-23 DIAGNOSIS — E7849 Other hyperlipidemia: Secondary | ICD-10-CM | POA: Diagnosis not present

## 2019-05-23 DIAGNOSIS — Z6841 Body Mass Index (BMI) 40.0 and over, adult: Secondary | ICD-10-CM

## 2019-05-23 DIAGNOSIS — E119 Type 2 diabetes mellitus without complications: Secondary | ICD-10-CM

## 2019-05-27 ENCOUNTER — Encounter (INDEPENDENT_AMBULATORY_CARE_PROVIDER_SITE_OTHER): Payer: Self-pay | Admitting: Bariatrics

## 2019-05-27 NOTE — Progress Notes (Signed)
Chief Complaint:   OBESITY Stacey Page is here to discuss her progress with her obesity treatment plan along with follow-up of her obesity related diagnoses. Stacey Page is on the Category 3 Plan and states she is following her eating plan approximately 60% of the time. Stacey Page states she is exercising 0 minutes 0 times per week.  Today's visit was #: 5 Starting weight: 344 lbs Starting date: 03/25/2019 Today's weight: 332 lbs Today's date: 05/23/2019 Total lbs lost to date: 12 Total lbs lost since last in-office visit: 0  Interim History: Stacey Page has maintained her weight. She notes she had a tooth pulled and had sutures. She would like to add guacamole. She is eating Cheerios with [employer] milk for breakfast. She notes adequate water intake. No plans for spring or summer.  Subjective:   Other hyperlipidemia. Stacey Page is on Lipitor and denies issues with her medications.  Lab Results  Component Value Date   CHOL 132 11/28/2018   HDL 52.60 11/28/2018   LDLCALC 64 11/28/2018   TRIG 78.0 11/28/2018   CHOLHDL 3 11/28/2018   Lab Results  Component Value Date   ALT 11 11/28/2018   AST 14 11/28/2018   ALKPHOS 67 11/28/2018   BILITOT 0.4 11/28/2018   The 10-year ASCVD risk score Stacey Bussing DC Jr., et al., 2013) is: 16.1%   Values used to calculate the score:     Age: 69 years     Sex: Female     Is Non-Hispanic African American: Yes     Diabetic: Yes     Tobacco smoker: No     Systolic Blood Pressure: 875 mmHg     Is BP treated: Yes     HDL Cholesterol: 52.6 mg/dL     Total Cholesterol: 132 mg/dL  Essential hypertension. Stacey Page is on Coreg and lisinopril. Blood pressure is controlled today.  BP Readings from Last 3 Encounters:  05/23/19 128/81  05/09/19 137/70  04/25/19 137/67   Lab Results  Component Value Date   CREATININE 1.57 (H) 12/24/2018   CREATININE 1.18 11/28/2018   CREATININE 1.34 (H) 01/16/2018   Type 2 diabetes mellitus without  complication, without long-term current use of insulin ([place]). Stacey Page is taking Januvia. Last A1c 6.0. Diabetes is well controlled.   Lab Results  Component Value Date   HGBA1C 6.0 (H) 03/25/2019   HGBA1C 6.1 11/28/2018   HGBA1C 6.2 01/16/2018   Lab Results  Component Value Date   MICROALBUR 1.9 01/16/2018   LDLCALC 64 11/28/2018   CREATININE 1.57 (H) 12/24/2018   Lab Results  Component Value Date   INSULIN 8.8 03/25/2019   Assessment/Plan:   Other hyperlipidemia. Cardiovascular risk and specific lipid/LDL goals reviewed.  We discussed several lifestyle modifications today and Shaliyah will continue to work on diet, exercise and weight loss efforts. Orders and follow up as documented in patient record. Lillee will continue Lipitor as directed.  Counseling Intensive lifestyle modifications are the first line treatment for this issue. . Dietary changes: Increase soluble fiber. Decrease simple carbohydrates. . Exercise changes: Moderate to vigorous-intensity aerobic activity 150 minutes per week if tolerated. . Lipid-lowering medications: see documented in medical record.  Essential hypertension. Theona is working on healthy weight loss and exercise to improve blood pressure control. We will watch for signs of hypotension as she continues her lifestyle modifications. She will continue her medications as directed.  Type 2 diabetes mellitus without complication, without long-term current use of insulin ([place]). Good blood sugar control is important to decrease  the likelihood of diabetic complications such as nephropathy, neuropathy, limb loss, blindness, coronary artery disease, and death. Intensive lifestyle modification including diet, exercise and weight loss are the first line of treatment for diabetes. Stacey Page will decrease carbs and increase exercise.  Class 3 severe obesity with serious comorbidity and body mass index (BMI) of 50.0 to 59.9 in adult, unspecified obesity type  ([place]).  Stacey Page is currently in the action stage of change. As such, her goal is to continue with weight loss efforts. She has agreed to the Category 3 Plan.   She will work on meal planning.  Handout on lunch options was given.  Exercise goals: No exercise has been prescribed at this time.  Behavioral modification strategies: increasing lean protein intake, decreasing simple carbohydrates, increasing vegetables, increasing water intake and meal planning and cooking strategies.  Stacey Page has agreed to follow-up with our clinic in 4 weeks. She was informed of the importance of frequent follow-up visits to maximize her success with intensive lifestyle modifications for her multiple health conditions.   Objective:   Blood pressure 128/81, pulse (!) 55, temperature 97.9 F (36.6 C), height 5\' 6"  (1.676 m), weight (!) 332 lb (150.6 kg), SpO2 95 %. Body mass index is 53.59 kg/m.  General: Cooperative, alert, well developed, in no acute distress. HEENT: Conjunctivae and lids unremarkable. Cardiovascular: Regular rhythm.  Lungs: Normal work of breathing. Neurologic: No focal deficits.   Lab Results  Component Value Date   CREATININE 1.57 (H) 12/24/2018   BUN 24 (H) 12/24/2018   NA 140 12/24/2018   K 4.0 12/24/2018   CL 106 12/24/2018   CO2 25 12/24/2018   Lab Results  Component Value Date   ALT 11 11/28/2018   AST 14 11/28/2018   ALKPHOS 67 11/28/2018   BILITOT 0.4 11/28/2018   Lab Results  Component Value Date   HGBA1C 6.0 (H) 03/25/2019   HGBA1C 6.1 11/28/2018   HGBA1C 6.2 01/16/2018   HGBA1C 5.9 01/19/2017   HGBA1C 5.5 03/07/2016   Lab Results  Component Value Date   INSULIN 8.8 03/25/2019   Lab Results  Component Value Date   TSH 0.51 11/28/2018   Lab Results  Component Value Date   CHOL 132 11/28/2018   HDL 52.60 11/28/2018   LDLCALC 64 11/28/2018   TRIG 78.0 11/28/2018   CHOLHDL 3 11/28/2018   Lab Results  Component Value Date   WBC 4.8 12/24/2018    HGB 12.7 12/24/2018   HCT 41.4 12/24/2018   MCV 95.8 12/24/2018   PLT 213 12/24/2018   No results found for: IRON, TIBC, FERRITIN  Obesity Behavioral Intervention Documentation for Insurance:   Approximately 15 minutes were spent on the discussion below.  ASK: We discussed the diagnosis of obesity with [name] today and Amritha agreed to give [name] permission to discuss obesity behavioral modification therapy today.  ASSESS: Curstin has the diagnosis of obesity and her BMI today is 53.6. Ebonye is in the action stage of change.   ADVISE: Jaedan was educated on the multiple health risks of obesity as well as the benefit of weight loss to improve her health. She was advised of the need for long term treatment and the importance of lifestyle modifications to improve her current health and to decrease her risk of future health problems.  AGREE: Multiple dietary modification options and treatment options were discussed and Loretto agreed to follow the recommendations documented in the above note.  ARRANGE: Kaydense was educated on the importance of frequent visits to  treat obesity as outlined per CMS and USPSTF guidelines and agreed to schedule her next follow up appointment today.  Attestation Statements:   Reviewed by clinician on day of visit: allergies, medications, problem list, medical history, surgical history, family history, social history, and previous encounter notes.  Migdalia Dk, am acting as Location manager for [employer], DO   I have reviewed the above documentation for accuracy and completeness, and I agree with the above. Jearld Lesch, DO

## 2019-05-29 ENCOUNTER — Telehealth (INDEPENDENT_AMBULATORY_CARE_PROVIDER_SITE_OTHER): Payer: Self-pay

## 2019-05-29 DIAGNOSIS — E559 Vitamin D deficiency, unspecified: Secondary | ICD-10-CM

## 2019-05-29 MED ORDER — VITAMIN D (ERGOCALCIFEROL) 1.25 MG (50000 UNIT) PO CAPS: 50000.0000 [IU] | ORAL_CAPSULE | ORAL | 0 refills | Status: DC

## 2019-05-30 ENCOUNTER — Other Ambulatory Visit: Payer: Self-pay | Admitting: Nurse Practitioner

## 2019-05-30 DIAGNOSIS — J309 Allergic rhinitis, unspecified: Secondary | ICD-10-CM

## 2019-05-30 DIAGNOSIS — I10 Essential (primary) hypertension: Secondary | ICD-10-CM

## 2019-05-30 DIAGNOSIS — E785 Hyperlipidemia, unspecified: Secondary | ICD-10-CM

## 2019-05-30 DIAGNOSIS — G4700 Insomnia, unspecified: Secondary | ICD-10-CM | POA: Diagnosis not present

## 2019-05-30 DIAGNOSIS — G4733 Obstructive sleep apnea (adult) (pediatric): Secondary | ICD-10-CM | POA: Diagnosis not present

## 2019-05-30 MED ORDER — ATORVASTATIN CALCIUM 20 MG PO TABS: 20.0000 mg | ORAL_TABLET | Freq: Every day | ORAL | 0 refills | Status: DC

## 2019-05-30 MED ORDER — LISINOPRIL 20 MG PO TABS: 20.0000 mg | ORAL_TABLET | Freq: Every day | ORAL | 0 refills | Status: DC

## 2019-05-30 MED ORDER — MONTELUKAST SODIUM 10 MG PO TABS: 10.0000 mg | ORAL_TABLET | Freq: Every day | ORAL | 0 refills | Status: DC

## 2019-05-31 ENCOUNTER — Telehealth: Payer: Self-pay | Admitting: Cardiology

## 2019-05-31 MED ORDER — CARVEDILOL 25 MG PO TABS: 25.0000 mg | ORAL_TABLET | Freq: Two times a day (BID) | ORAL | 3 refills | Status: DC

## 2019-05-31 MED ORDER — FUROSEMIDE 40 MG PO TABS: ORAL_TABLET | ORAL | 1 refills | Status: DC

## 2019-05-31 NOTE — Telephone Encounter (Signed)
Refill sent to the pharmacy electronically.  

## 2019-05-31 NOTE — Telephone Encounter (Signed)
New Message  Pt c/o medication issue:  1. Name of Medication: carvedilol (COREG) 25 MG tablet; furosemide (LASIX) 40 MG tablet  2. How are you currently taking this medication (dosage and times per day)? As written  3. Are you having a reaction (difficulty breathing--STAT)? No  4. What is your medication issue? Both medications needs new prescription

## 2019-05-31 NOTE — Telephone Encounter (Signed)
Routed to operator - no encounter notes

## 2019-06-03 ENCOUNTER — Other Ambulatory Visit (INDEPENDENT_AMBULATORY_CARE_PROVIDER_SITE_OTHER): Payer: Self-pay | Admitting: Bariatrics

## 2019-06-03 DIAGNOSIS — E559 Vitamin D deficiency, unspecified: Secondary | ICD-10-CM

## 2019-06-07 ENCOUNTER — Other Ambulatory Visit: Payer: Self-pay

## 2019-06-07 ENCOUNTER — Ambulatory Visit
Admission: RE | Admit: 2019-06-07 | Discharge: 2019-06-07 | Disposition: A | Discharge status: Home / Self Care | Payer: PPO | Source: Ambulatory Visit | Attending: Nurse Practitioner | Admitting: Nurse Practitioner

## 2019-06-07 ENCOUNTER — Other Ambulatory Visit: Payer: Self-pay | Admitting: Nurse Practitioner

## 2019-06-07 DIAGNOSIS — N63 Unspecified lump in unspecified breast: Secondary | ICD-10-CM

## 2019-06-07 DIAGNOSIS — Z1231 Encounter for screening mammogram for malignant neoplasm of breast: Secondary | ICD-10-CM

## 2019-06-15 ENCOUNTER — Other Ambulatory Visit: Payer: Self-pay | Admitting: Cardiology

## 2019-06-20 ENCOUNTER — Ambulatory Visit (INDEPENDENT_AMBULATORY_CARE_PROVIDER_SITE_OTHER): Payer: PPO | Admitting: Bariatrics

## 2019-06-20 ENCOUNTER — Encounter (INDEPENDENT_AMBULATORY_CARE_PROVIDER_SITE_OTHER): Payer: Self-pay | Admitting: Bariatrics

## 2019-06-20 ENCOUNTER — Other Ambulatory Visit: Payer: Self-pay

## 2019-06-20 VITALS — BP 135/72 | HR 56 | Temp 97.6°F | Ht 66.0 in | Wt 329.0 lb

## 2019-06-20 DIAGNOSIS — Z6841 Body Mass Index (BMI) 40.0 and over, adult: Secondary | ICD-10-CM | POA: Diagnosis not present

## 2019-06-20 DIAGNOSIS — I10 Essential (primary) hypertension: Secondary | ICD-10-CM

## 2019-06-20 DIAGNOSIS — E559 Vitamin D deficiency, unspecified: Secondary | ICD-10-CM

## 2019-06-20 NOTE — Progress Notes (Signed)
Chief Complaint:   OBESITY Stacey Page is here to discuss her progress with her obesity treatment plan along with follow-up of her obesity related diagnoses. Stacey Page is on the Category 3 Plan and states she is following her eating plan approximately 100% of the time. Stacey Page states she is walking 5 minutes 4-5 times per week.  Today's visit was #: 6 Starting weight: 344 lbs Starting date: 03/25/2019 Today's weight: 329 lbs Today's date: 06/20/2019 Total lbs lost to date: 15 Total lbs lost since last in-office visit: 3  Interim History: Stacey Page is down an additional 3 lbs and doing well overall. She reports doing well with her protein and water intake.  Subjective:   Vitamin D deficiency. Stacey Page is taking Vitamin D. Last Vitamin D 38.2 on 03/25/2019.  Essential hypertension. Blood pressure is well controlled.  BP Readings from Last 3 Encounters:  06/20/19 135/72  05/23/19 128/81  05/09/19 137/70   Lab Results  Component Value Date   CREATININE 1.57 (H) 12/24/2018   CREATININE 1.18 11/28/2018   CREATININE 1.34 (H) 01/16/2018   Assessment/Plan:   Vitamin D deficiency. Low Vitamin D level contributes to fatigue and are associated with obesity, breast, and colon cancer. She agrees to continue to take Vitamin D and will follow-up for routine testing of Vitamin D, at least 2-3 times per year to avoid over-replacement.  Essential hypertension. Stacey Page is working on healthy weight loss and exercise to improve blood pressure control. We will watch for signs of hypotension as she continues her lifestyle modifications. She will continue her medication as directed.  Class 3 severe obesity with serious comorbidity and body mass index (BMI) of 50.0 to 59.9 in adult, unspecified obesity type ([place]).  Stacey Page is currently in the action stage of change. As such, her goal is to continue with weight loss efforts. She has agreed to the Category 3 Plan.   She will work on  meal planning and intentional eating.  Exercise goals: Stacey Page will continue to walk and will increase over time.  Behavioral modification strategies: increasing lean protein intake, decreasing simple carbohydrates, increasing vegetables, increasing water intake, decreasing eating out, no skipping meals, meal planning and cooking strategies and keeping healthy foods in the home.  Stacey Page has agreed to follow-up with our clinic in 2 weeks. She was informed of the importance of frequent follow-up visits to maximize her success with intensive lifestyle modifications for her multiple health conditions.   Objective:   Blood pressure 135/72, pulse (!) 56, temperature 97.6 F (36.4 C), height 5\' 6"  (1.676 m), weight (!) 329 lb (149.2 kg), SpO2 94 %. Body mass index is 53.1 kg/m.  General: Cooperative, alert, well developed, in no acute distress. HEENT: Conjunctivae and lids unremarkable. Cardiovascular: Regular rhythm.  Lungs: Normal work of breathing. Neurologic: No focal deficits.   Lab Results  Component Value Date   CREATININE 1.57 (H) 12/24/2018   BUN 24 (H) 12/24/2018   NA 140 12/24/2018   K 4.0 12/24/2018   CL 106 12/24/2018   CO2 25 12/24/2018   Lab Results  Component Value Date   ALT 11 11/28/2018   AST 14 11/28/2018   ALKPHOS 67 11/28/2018   BILITOT 0.4 11/28/2018   Lab Results  Component Value Date   HGBA1C 6.0 (H) 03/25/2019   HGBA1C 6.1 11/28/2018   HGBA1C 6.2 01/16/2018   HGBA1C 5.9 01/19/2017   HGBA1C 5.5 03/07/2016   Lab Results  Component Value Date   INSULIN 8.8 03/25/2019   Lab  Results  Component Value Date   TSH 0.51 11/28/2018   Lab Results  Component Value Date   CHOL 132 11/28/2018   HDL 52.60 11/28/2018   LDLCALC 64 11/28/2018   TRIG 78.0 11/28/2018   CHOLHDL 3 11/28/2018   Lab Results  Component Value Date   WBC 4.8 12/24/2018   HGB 12.7 12/24/2018   HCT 41.4 12/24/2018   MCV 95.8 12/24/2018   PLT 213 12/24/2018   No results  found for: IRON, TIBC, FERRITIN  Obesity Behavioral Intervention Documentation for Insurance:   Approximately 15 minutes were spent on the discussion below.  ASK: We discussed the diagnosis of obesity with Derry today and Stacey Page agreed to give Stacey Page permission to discuss obesity behavioral modification therapy today.  ASSESS: Stacey Page has the diagnosis of obesity and her BMI today is 53.2. Stacey Page is in the action stage of change.   ADVISE: Stacey Page was educated on the multiple health risks of obesity as well as the benefit of weight loss to improve her health. She was advised of the need for long term treatment and the importance of lifestyle modifications to improve her current health and to decrease her risk of future health problems.  AGREE: Multiple dietary modification options and treatment options were discussed and Stacey Page agreed to follow the recommendations documented in the above note.  ARRANGE: Stacey Page was educated on the importance of frequent visits to treat obesity as outlined per CMS and USPSTF guidelines and agreed to schedule her next follow up appointment today.  Attestation Statements:   Reviewed by clinician on day of visit: allergies, medications, problem list, medical history, surgical history, family history, social history, and previous encounter notes.  Migdalia Dk, am acting as Location manager for [employer], DO   I have reviewed the above documentation for accuracy and completeness, and I agree with the above. Jearld Lesch, DO

## 2019-06-21 ENCOUNTER — Ambulatory Visit
Admission: RE | Admit: 2019-06-21 | Discharge: 2019-06-21 | Disposition: A | Discharge status: Home / Self Care | Payer: PPO | Source: Ambulatory Visit | Attending: Nurse Practitioner | Admitting: Nurse Practitioner

## 2019-06-21 DIAGNOSIS — N644 Mastodynia: Secondary | ICD-10-CM | POA: Diagnosis not present

## 2019-06-21 DIAGNOSIS — N63 Unspecified lump in unspecified breast: Secondary | ICD-10-CM

## 2019-06-21 DIAGNOSIS — R928 Other abnormal and inconclusive findings on diagnostic imaging of breast: Secondary | ICD-10-CM | POA: Diagnosis not present

## 2019-07-01 ENCOUNTER — Telehealth: Payer: Self-pay | Admitting: Nurse Practitioner

## 2019-07-01 NOTE — Telephone Encounter (Signed)
Pt states that she had cologuard done 08/2017 and has learned more about colonoscopy since then. She asked if she should go to have a complete colonoscopy done. Advised pt likely to need appt. She preferred that I send a msg for provider to follow up with recommendation.   Ph# [phone]

## 2019-07-02 ENCOUNTER — Encounter (INDEPENDENT_AMBULATORY_CARE_PROVIDER_SITE_OTHER): Payer: Self-pay | Admitting: Bariatrics

## 2019-07-02 DIAGNOSIS — E669 Obesity, unspecified: Secondary | ICD-10-CM | POA: Insufficient documentation

## 2019-07-02 DIAGNOSIS — E1169 Type 2 diabetes mellitus with other specified complication: Secondary | ICD-10-CM | POA: Insufficient documentation

## 2019-07-02 NOTE — Telephone Encounter (Signed)
Charlotte please advise pt. Pt states she had a cologuard done 08/2017 but she learned more about colonoscopies and she wants to go have a complete colonoscopy. Pt would like a referral for this.   Last OV was video visit on 02/28/19.

## 2019-07-03 NOTE — Telephone Encounter (Signed)
Patient is calling back to check the status of previous message left. CB is [phone].

## 2019-07-03 NOTE — Telephone Encounter (Signed)
With normal cologuard results. She can wait till 2022 to schedule for colonoscopy.

## 2019-07-04 ENCOUNTER — Ambulatory Visit (INDEPENDENT_AMBULATORY_CARE_PROVIDER_SITE_OTHER): Payer: PPO | Admitting: Bariatrics

## 2019-07-04 ENCOUNTER — Encounter (INDEPENDENT_AMBULATORY_CARE_PROVIDER_SITE_OTHER): Payer: Self-pay | Admitting: Bariatrics

## 2019-07-04 ENCOUNTER — Other Ambulatory Visit: Payer: Self-pay

## 2019-07-04 VITALS — BP 129/60 | HR 62 | Temp 98.1°F | Ht 66.0 in | Wt 327.0 lb

## 2019-07-04 DIAGNOSIS — E669 Obesity, unspecified: Secondary | ICD-10-CM | POA: Diagnosis not present

## 2019-07-04 DIAGNOSIS — E7849 Other hyperlipidemia: Secondary | ICD-10-CM

## 2019-07-04 DIAGNOSIS — Z6841 Body Mass Index (BMI) 40.0 and over, adult: Secondary | ICD-10-CM

## 2019-07-04 DIAGNOSIS — E559 Vitamin D deficiency, unspecified: Secondary | ICD-10-CM | POA: Diagnosis not present

## 2019-07-04 DIAGNOSIS — E1169 Type 2 diabetes mellitus with other specified complication: Secondary | ICD-10-CM | POA: Diagnosis not present

## 2019-07-04 MED ORDER — VITAMIN D (ERGOCALCIFEROL) 1.25 MG (50000 UNIT) PO CAPS: 50000.0000 [IU] | ORAL_CAPSULE | ORAL | 0 refills | Status: DC

## 2019-07-04 NOTE — Progress Notes (Signed)
Chief Complaint:   OBESITY Stacey Page is here to discuss her progress with her obesity treatment plan along with follow-up of her obesity related diagnoses. Zhuri is on the Category 3 Plan and states she is following her eating plan approximately 50% of the time. Gracilyn states she is walking 30 minutes 3-4 times per week.  Today's visit was #: 7 Starting weight: 344 lbs Starting date: 03/25/2019 Today's weight: 327 lbs Today's date: 07/04/2019 Total lbs lost to date: 17 Total lbs lost since last in-office visit: 2  Interim History: Dashonna is down 2 lbs and doing well overall.  Subjective:   Vitamin D deficiency. No nausea, vomiting, or muscle weakness. Last Vitamin D 38.2 on 03/25/2019.  Type 2 diabetes mellitus with obesity ([place]). Mara is taking Januvia.  Lab Results  Component Value Date   HGBA1C 6.0 (H) 03/25/2019   HGBA1C 6.1 11/28/2018   HGBA1C 6.2 01/16/2018   Lab Results  Component Value Date   MICROALBUR 1.9 01/16/2018   LDLCALC 64 11/28/2018   CREATININE 1.57 (H) 12/24/2018   Lab Results  Component Value Date   INSULIN 8.8 03/25/2019   Other hyperlipidemia. Carsyn is taking Lipitor.   Lab Results  Component Value Date   CHOL 132 11/28/2018   HDL 52.60 11/28/2018   LDLCALC 64 11/28/2018   TRIG 78.0 11/28/2018   CHOLHDL 3 11/28/2018   Lab Results  Component Value Date   ALT 11 11/28/2018   AST 14 11/28/2018   ALKPHOS 67 11/28/2018   BILITOT 0.4 11/28/2018   The 10-year ASCVD risk score [name], et al., 2013) is: 16.4%   Values used to calculate the score:     Age: 69 years     Sex: Female     Is Non-Hispanic African American: Yes     Diabetic: Yes     Tobacco smoker: No     Systolic Blood Pressure: 585 mmHg     Is BP treated: Yes     HDL Cholesterol: 52.6 mg/dL     Total Cholesterol: 132 mg/dL  Assessment/Plan:   Vitamin D deficiency. Low Vitamin D level contributes to fatigue and are associated with obesity,  breast, and colon cancer. She was given a prescription for Vitamin D, Ergocalciferol, (DRISDOL) 1.25 MG (50000 UNIT) CAPS capsule every week #4 with 0 refills and VITAMIN D 25 Hydroxy (Vit-D Deficiency, Fractures) level was ordered today.  Type 2 diabetes mellitus with obesity ([place]). Good blood sugar control is important to decrease the likelihood of diabetic complications such as nephropathy, neuropathy, limb loss, blindness, coronary artery disease, and death. Intensive lifestyle modification including diet, exercise and weight loss are the first line of treatment for diabetes. She will continue her medication as directed. Hemoglobin A1c, Insulin, random labs ordered today.  Other hyperlipidemia. Cardiovascular risk and specific lipid/LDL goals reviewed.  We discussed several lifestyle modifications today and Artrice will continue to work on diet, exercise and weight loss efforts. Orders and follow up as documented in patient record. She will continue Lipitor as directed. Comprehensive metabolic panel, Lipid Panel With LDL/HDL Ratio labs ordered today.  Counseling Intensive lifestyle modifications are the first line treatment for this issue. . Dietary changes: Increase soluble fiber. Decrease simple carbohydrates. . Exercise changes: Moderate to vigorous-intensity aerobic activity 150 minutes per week if tolerated. . Lipid-lowering medications: see documented in medical record.    Class 3 severe obesity with serious comorbidity and body mass index (BMI) of 50.0 to 59.9 in adult,  unspecified obesity type ([place]).  Sofhia is currently in the action stage of change. As such, her goal is to continue with weight loss efforts. She has agreed to the Category 3 Plan.   She will work on meal planning and intentional eating.   Exercise goals: Older adults should follow the adult guidelines. When older adults cannot meet the adult guidelines, they should be as physically active as their abilities and  conditions will allow.   Behavioral modification strategies: increasing lean protein intake, decreasing simple carbohydrates, increasing vegetables, increasing water intake, decreasing eating out, no skipping meals, meal planning and cooking strategies and keeping healthy foods in the home.  Karyme has agreed to follow-up with our clinic in 3-4 weeks. She was informed of the importance of frequent follow-up visits to maximize her success with intensive lifestyle modifications for her multiple health conditions.   Ebonique was informed we would discuss her lab results at her next visit unless there is a critical issue that needs to be addressed sooner. Deliana agreed to keep her next visit at the agreed upon time to discuss these results.  Objective:   Blood pressure 129/60, pulse 62, temperature 98.1 F (36.7 C), temperature source Oral, height 5\' 6"  (1.676 m), weight (!) 327 lb (148.3 kg), SpO2 95 %. Body mass index is 52.78 kg/m.  General: Cooperative, alert, well developed, in no acute distress. HEENT: Conjunctivae and lids unremarkable. Cardiovascular: Regular rhythm.  Lungs: Normal work of breathing. Neurologic: No focal deficits.   Lab Results  Component Value Date   CREATININE 1.57 (H) 12/24/2018   BUN 24 (H) 12/24/2018   NA 140 12/24/2018   K 4.0 12/24/2018   CL 106 12/24/2018   CO2 25 12/24/2018   Lab Results  Component Value Date   ALT 11 11/28/2018   AST 14 11/28/2018   ALKPHOS 67 11/28/2018   BILITOT 0.4 11/28/2018   Lab Results  Component Value Date   HGBA1C 6.0 (H) 03/25/2019   HGBA1C 6.1 11/28/2018   HGBA1C 6.2 01/16/2018   HGBA1C 5.9 01/19/2017   HGBA1C 5.5 03/07/2016   Lab Results  Component Value Date   INSULIN 8.8 03/25/2019   Lab Results  Component Value Date   TSH 0.51 11/28/2018   Lab Results  Component Value Date   CHOL 132 11/28/2018   HDL 52.60 11/28/2018   LDLCALC 64 11/28/2018   TRIG 78.0 11/28/2018   CHOLHDL 3 11/28/2018    Lab Results  Component Value Date   WBC 4.8 12/24/2018   HGB 12.7 12/24/2018   HCT 41.4 12/24/2018   MCV 95.8 12/24/2018   PLT 213 12/24/2018   No results found for: IRON, TIBC, FERRITIN  Obesity Behavioral Intervention Documentation for Insurance:   Approximately 15 minutes were spent on the discussion below.  ASK: We discussed the diagnosis of obesity with Akiva today and Carole agreed to give [name] permission to discuss obesity behavioral modification therapy today.  ASSESS: Chayna has the diagnosis of obesity and her BMI today is 52.8. Amala is in the action stage of change.   ADVISE: Isabele was educated on the multiple health risks of obesity as well as the benefit of weight loss to improve her health. She was advised of the need for long term treatment and the importance of lifestyle modifications to improve her current health and to decrease her risk of future health problems.  AGREE: Multiple dietary modification options and treatment options were discussed and Serafina agreed to follow the recommendations documented in the  above note.  ARRANGE: Maecyn was educated on the importance of frequent visits to treat obesity as outlined per CMS and USPSTF guidelines and agreed to schedule her next follow up appointment today.  Attestation Statements:   Reviewed by clinician on day of visit: allergies, medications, problem list, medical history, surgical history, family history, social history, and previous encounter notes.  Migdalia Dk, am acting as Location manager for [employer], DO   I have reviewed the above documentation for accuracy and completeness, and I agree with the above. Jearld Lesch, DO

## 2019-07-04 NOTE — Telephone Encounter (Signed)
Pt was notified and verbally understood.

## 2019-07-05 LAB — LIPID PANEL WITH LDL/HDL RATIO
Cholesterol, Total: 128 mg/dL (ref 100–199)
HDL: 61 mg/dL (ref >39)
LDL Chol Calc (NIH): 53 mg/dL (ref 0–99)
LDL/HDL Ratio: 0.9 ratio (ref 0.0–3.2)
Triglycerides: 66 mg/dL (ref 0–149)
VLDL Cholesterol Cal: 14 mg/dL (ref 5–40)

## 2019-07-05 LAB — COMPREHENSIVE METABOLIC PANEL
ALT: 9 IU/L (ref 0–32)
AST: 12 IU/L (ref 0–40)
Albumin/Globulin Ratio: 1.2 (ref 1.2–2.2)
Albumin: 4.3 g/dL (ref 3.8–4.8)
Alkaline Phosphatase: 78 IU/L (ref 48–121)
BUN/Creatinine Ratio: 13 (ref 12–28)
BUN: 26 mg/dL (ref 8–27)
Bilirubin Total: 0.4 mg/dL (ref 0.0–1.2)
CO2: 26 mmol/L (ref 20–29)
Calcium: 10.1 mg/dL (ref 8.7–10.3)
Chloride: 99 mmol/L (ref 96–106)
Creatinine, Ser: 2.08 mg/dL — ABNORMAL HIGH (ref 0.57–1.00)
GFR calc Af Amer: 28 mL/min/{1.73_m2} — ABNORMAL LOW (ref >59)
GFR calc non Af Amer: 24 mL/min/{1.73_m2} — ABNORMAL LOW (ref >59)
Globulin, Total: 3.6 g/dL (ref 1.5–4.5)
Glucose: 93 mg/dL (ref 65–99)
Potassium: 4.2 mmol/L (ref 3.5–5.2)
Sodium: 139 mmol/L (ref 134–144)
Total Protein: 7.9 g/dL (ref 6.0–8.5)

## 2019-07-05 LAB — HEMOGLOBIN A1C
Est. average glucose Bld gHb Est-mCnc: 123 mg/dL
Hgb A1c MFr Bld: 5.9 % — ABNORMAL HIGH (ref 4.8–5.6)

## 2019-07-05 LAB — VITAMIN D 25 HYDROXY (VIT D DEFICIENCY, FRACTURES): Vit D, 25-Hydroxy: 41.5 ng/mL (ref 30.0–100.0)

## 2019-07-05 LAB — INSULIN, RANDOM: INSULIN: 9.2 u[IU]/mL (ref 2.6–24.9)

## 2019-07-09 ENCOUNTER — Encounter (INDEPENDENT_AMBULATORY_CARE_PROVIDER_SITE_OTHER): Payer: Self-pay | Admitting: Bariatrics

## 2019-07-09 ENCOUNTER — Telehealth: Payer: Self-pay | Admitting: Cardiology

## 2019-07-09 DIAGNOSIS — N184 Chronic kidney disease, stage 4 (severe): Secondary | ICD-10-CM | POA: Insufficient documentation

## 2019-07-09 NOTE — Telephone Encounter (Signed)
     I went in pt's chart to see a previous encounter, that was in my pt messages.

## 2019-07-25 ENCOUNTER — Other Ambulatory Visit: Payer: Self-pay

## 2019-07-25 ENCOUNTER — Ambulatory Visit (INDEPENDENT_AMBULATORY_CARE_PROVIDER_SITE_OTHER): Payer: PPO | Admitting: Bariatrics

## 2019-07-25 ENCOUNTER — Encounter (INDEPENDENT_AMBULATORY_CARE_PROVIDER_SITE_OTHER): Payer: Self-pay | Admitting: Bariatrics

## 2019-07-25 VITALS — BP 117/72 | HR 59 | Temp 97.6°F | Ht 66.0 in | Wt 332.0 lb

## 2019-07-25 DIAGNOSIS — E119 Type 2 diabetes mellitus without complications: Secondary | ICD-10-CM | POA: Diagnosis not present

## 2019-07-25 DIAGNOSIS — E559 Vitamin D deficiency, unspecified: Secondary | ICD-10-CM

## 2019-07-25 DIAGNOSIS — Z6841 Body Mass Index (BMI) 40.0 and over, adult: Secondary | ICD-10-CM | POA: Diagnosis not present

## 2019-07-25 DIAGNOSIS — R944 Abnormal results of kidney function studies: Secondary | ICD-10-CM

## 2019-07-25 MED ORDER — VITAMIN D3 1.25 MG (50000 UT) PO CAPS: 1.0000 | ORAL_CAPSULE | ORAL | 0 refills | Status: DC

## 2019-07-29 ENCOUNTER — Encounter (INDEPENDENT_AMBULATORY_CARE_PROVIDER_SITE_OTHER): Payer: Self-pay | Admitting: Bariatrics

## 2019-07-29 NOTE — Addendum Note (Signed)
Addended by: Lennette Bihari A on: 07/29/2019 12:54 PM   Modules accepted: Orders

## 2019-07-29 NOTE — Progress Notes (Signed)
Chief Complaint:   OBESITY Stacey Page is here to discuss her progress with her obesity treatment plan along with follow-up of her obesity related diagnoses. Stacey Page is on the Category 3 Plan and states she is following her eating plan approximately 50% of the time. Stacey Page states she is walking 10-15 minutes 5 times per week.  Today's visit was #: 8 Starting weight: 344 lbs Starting date: 03/25/2019 Today's weight: 332 lbs Today's date: 07/25/2019 Total lbs lost to date: 12 Total lbs lost since last in-office visit: 0  Interim History: Stacey Page is up 5 lbs, but has done well overall. She has been eating more carbohydrates and reports she is getting adequate water intake.  Subjective:   Vitamin D deficiency. Last Vitamin D was 41.5 on 07/04/2019.  Type 2 diabetes mellitus without complication, without long-term current use of insulin (Stacey Page). Stacey Page is in the prediabetic range with an A1c of 5.9 on 07/04/2019.   Lab Results  Component Value Date   HGBA1C 5.9 (H) 07/04/2019   HGBA1C 6.0 (H) 03/25/2019   HGBA1C 6.1 11/28/2018   Lab Results  Component Value Date   MICROALBUR 1.9 01/16/2018   LDLCALC 53 07/04/2019   CREATININE 2.08 (H) 07/04/2019   Lab Results  Component Value Date   INSULIN 9.2 07/04/2019   INSULIN 8.8 03/25/2019   Decreased GFR. Stacey Page had a low GFR of 28 on 07/04/2019. She reports having seen a nephrologist in the past.  Assessment/Plan:   Vitamin D deficiency. Low Vitamin D level contributes to fatigue and are associated with obesity, breast, and colon cancer. She was given a prescription for Cholecalciferol (VITAMIN D3) 1.25 MG (50000 UT) CAPS every week #4 with 0 refills and will follow-up for routine testing of Vitamin D, at least 2-3 times per year to avoid over-replacement.   Type 2 diabetes mellitus without complication, without long-term current use of insulin ([place]). Good blood sugar control is important to decrease the likelihood of  diabetic complications such as nephropathy, neuropathy, limb loss, blindness, coronary artery disease, and death. Intensive lifestyle modification including diet, exercise and weight loss are the first line of treatment for diabetes. Stacey Page will decrease carbohydrates and increasing healthy fats and protein.  Decreased GFR. Stacey Page will be referred to a neurologist in [place].  Class 3 severe obesity with serious comorbidity and body mass index (BMI) of 50.0 to 59.9 in adult, unspecified obesity type (Stacey Page).  Stacey Page is currently in the action stage of change. As such, her goal is to continue with weight loss efforts. She has agreed to the Category 3 Plan.   We reviewed with the patient labs from 07/04/2019.  Exercise goals: Stacey Page will increase walking in the a.m.  Behavioral modification strategies: increasing lean protein intake, decreasing simple carbohydrates, increasing vegetables, increasing water intake, decreasing eating out, no skipping meals, meal planning and cooking strategies, keeping healthy foods in the home and planning for success.  Stacey Page has agreed to follow-up with our clinic in 2-3 weeks. She was informed of the importance of frequent follow-up visits to maximize her success with intensive lifestyle modifications for her multiple health conditions.   Objective:   Blood pressure 117/72, pulse (!) 59, temperature 97.6 F (36.4 C), height 5\' 6"  (1.676 m), weight (!) 332 lb (150.6 kg), SpO2 97 %. Body mass index is 53.59 kg/m.  General: Cooperative, alert, well developed, in no acute distress. HEENT: Conjunctivae and lids unremarkable. Cardiovascular: Regular rhythm.  Lungs: Normal work of breathing. Neurologic: No focal deficits.  Lab Results  Component Value Date   CREATININE 2.08 (H) 07/04/2019   BUN 26 07/04/2019   NA 139 07/04/2019   K 4.2 07/04/2019   CL 99 07/04/2019   CO2 26 07/04/2019   Lab Results  Component Value Date   ALT 9  07/04/2019   AST 12 07/04/2019   ALKPHOS 78 07/04/2019   BILITOT 0.4 07/04/2019   Lab Results  Component Value Date   HGBA1C 5.9 (H) 07/04/2019   HGBA1C 6.0 (H) 03/25/2019   HGBA1C 6.1 11/28/2018   HGBA1C 6.2 01/16/2018   HGBA1C 5.9 01/19/2017   Lab Results  Component Value Date   INSULIN 9.2 07/04/2019   INSULIN 8.8 03/25/2019   Lab Results  Component Value Date   TSH 0.51 11/28/2018   Lab Results  Component Value Date   CHOL 128 07/04/2019   HDL 61 07/04/2019   LDLCALC 53 07/04/2019   TRIG 66 07/04/2019   CHOLHDL 3 11/28/2018   Lab Results  Component Value Date   WBC 4.8 12/24/2018   HGB 12.7 12/24/2018   HCT 41.4 12/24/2018   MCV 95.8 12/24/2018   PLT 213 12/24/2018   No results found for: IRON, TIBC, FERRITIN  Obesity Behavioral Intervention Documentation for Insurance:   Approximately 15 minutes were spent on the discussion below.  ASK: We discussed the diagnosis of obesity with Stacey Page today and Stacey Page agreed to give [name] permission to discuss obesity behavioral modification therapy today.  ASSESS: Stacey Page has the diagnosis of obesity and her BMI today is 53.6. Stacey Page is in the action stage of change.   ADVISE: Stacey Page was educated on the multiple health risks of obesity as well as the benefit of weight loss to improve her health. She was advised of the need for long term treatment and the importance of lifestyle modifications to improve her current health and to decrease her risk of future health problems.  AGREE: Multiple dietary modification options and treatment options were discussed and Stacey Page agreed to follow the recommendations documented in the above note.  ARRANGE: Stacey Page was educated on the importance of frequent visits to treat obesity as outlined per CMS and USPSTF guidelines and agreed to schedule her next follow up appointment today.  Attestation Statements:   Reviewed by clinician on day of visit: allergies,  medications, problem list, medical history, surgical history, family history, social history, and previous encounter notes.  Migdalia Dk, am acting as Location manager for [employer], DO   I have reviewed the above documentation for accuracy and completeness, and I agree with the above. Jearld Lesch, DO

## 2019-07-30 ENCOUNTER — Other Ambulatory Visit: Payer: Self-pay

## 2019-07-30 NOTE — Progress Notes (Signed)
Subjective:   Alta Goding is a 69 y.o. female who presents for Medicare Annual (Subsequent) preventive examination.  Review of Systems:   Cardiac Risk Factors include: advanced age (>49men, >33 women);diabetes mellitus;dyslipidemia;hypertension;obesity (BMI >30kg/m2);sedentary lifestyle     Objective:     Vitals: BP 118/78 (BP Location: Left Arm, Patient Position: Sitting, Cuff Size: Large)   Pulse (!) 57   Temp 98 F (36.7 C) (Temporal)   Resp 16   Ht 5\' 6"  (1.676 m)   Wt (!) 333 lb 3.2 oz (151.1 kg)   SpO2 97%   BMI 53.78 kg/m   Body mass index is 53.78 kg/m.  Advanced Directives 07/31/2019 12/24/2018 07/04/2018 06/28/2017 03/21/2017 04/04/2016 03/16/2016  Does Patient Have a Medical Advance Directive? No No No No No No No  Would patient like information on creating a medical advance directive? No - Patient declined No - Patient declined No - Patient declined - No - Patient declined Yes (MAU/Ambulatory/Procedural Areas - Information given) No - Patient declined    Tobacco Social History   Tobacco Use  Smoking Status Former Smoker  . Packs/day: 0.25  . Years: 15.00  . Pack years: 3.75  . Types: Cigarettes  . Quit date: 02/08/1980  . Years since quitting: 39.5  Smokeless Tobacco Former Engineer, structural given: Not Answered   Clinical Intake:  Pre-visit preparation completed: Yes  Pain : No/denies pain     Nutritional Status: BMI > 30  Obese Nutritional Risks: None Diabetes: Yes CBG done?: No Did pt. bring in CBG monitor from home?: No  How often do you need to have someone help you when you read instructions, pamphlets, or other written materials from your doctor or pharmacy?: 1 - Never What is the last grade level you completed in school?: high school graduate  Nutrition Risk Assessment:  Has the patient had any N/V/D within the last 2 months?  No  Does the patient have any non-healing wounds?  No  Has the patient had any unintentional weight loss  or weight gain?  No   Diabetes:  Is the patient diabetic?  Yes  If diabetic, was a CBG obtained today?  No  Did the patient bring in their glucometer from home?  No  How often do you monitor your CBG's? never.   Financial Strains and Diabetes Management:  Are you having any financial strains with the device, your supplies or your medication? No .  Does the patient want to be seen by Chronic Care Management for management of their diabetes?  No  Would the patient like to be referred to a Nutritionist or for Diabetic Management?  No   Diabetic Exams:  Diabetic Eye Exam: Completed 11/01/2018.   Diabetic Foot Exam: Completed 11/28/2018.    Interpreter Needed?: No  Information entered by :: Caroleen Hamman LPN  Past Medical History:  Diagnosis Date  . Anxiety    doesn't take any meds  . Arthritis of right knee 03/14/2016  . Asthma   . Back pain   . Chronic diastolic CHF (congestive heart failure) (HCC)    HF with Preserved EF (60-65%) - Grade II Diastolic Dysfunction (Hypertensive Heart Disease). takes Furosemide daily  . COPD (chronic obstructive pulmonary disease) (HCC)    Albuterol as needed  . Depression    doesn't take meds  . Diabetes ([place])    takes Januvia daily  . Eczema    uses cream as needed  . GERD (gastroesophageal reflux disease)   .  History of bronchitis as a child   . HTN (hypertension)   . Insomnia   . Mild aortic stenosis by prior echocardiogram 04/2017   Mild stenosis: Mean gradient 15 mmHg, peak gradient 28 mmHg  . OA (osteoarthritis)   . Obese   . OSA (obstructive sleep apnea) 02/25/2014   wears CPAP at night  . Peripheral neuropathy    takes Gabapentin as needed  . Pneumonia    hx of-2010  . Seasonal allergies    uses Flonase daily  . Sleep apnea   . SOB (shortness of breath)    Past Surgical History:  Procedure Laterality Date  . BREAST LUMPECTOMY WITH RADIOACTIVE SEED LOCALIZATION Right 12/28/2018   Procedure: RIGHT BREAST LUMPECTOMY WITH  RADIOACTIVE SEED LOCALIZATION;  Surgeon: Jovita Kussmaul, MD;  Location: [facility];  Service: General;  Laterality: Right;  . BREAST SURGERY    . CESAREAN SECTION  x2  . COLONOSCOPY    . CORONARY CALCIUM SCORE & CT ANGIOGRAM  09/2017   Coronary Ca Score = 11 (low).  CTA- no obstructive CAD (minimal disease)  . JOINT REPLACEMENT    . KNEE ARTHROSCOPY Right   . LUNG BIOPSY Right 06/10/2014   Procedure: LUNG BIOPSY;  Surgeon: Ivin Poot, MD;  Location: [facility];  Service: Thoracic;  Laterality: Right;  . POLYPECTOMY     throat  . TOTAL KNEE ARTHROPLASTY Right 03/14/2016   Procedure: RIGHT TOTAL KNEE ARTHROPLASTY;  Surgeon: Marybelle Killings, MD;  Location: [place];  Service: Orthopedics;  Laterality: Right;  . TRANSTHORACIC ECHOCARDIOGRAM  03/2017    EF 60-65% moderate concentric LVH GRII DD.  Mild aortic stenosis (mean gradient 15 mmHg, peak gradient 28 mmHg).  . VIDEO ASSISTED THORACOSCOPY Right 06/10/2014   Procedure: VIDEO ASSISTED THORACOSCOPY;  Surgeon: Ivin Poot, MD;  [facility];  Service: Thoracic;  Laterality: Right;   Family History  Problem Relation Age of Onset  . COPD Father   . Hypothyroidism Father   . Anemia Father        iron deficiency  . High blood pressure Father   . Alcoholism Father   . Cancer Mother 20       pancreatic  . High blood pressure Mother   . High Cholesterol Mother   . Breast cancer Neg Hx    Social History   Socioeconomic History  . Marital status: Widowed    Spouse name: Not on file  . Number of children: Not on file  . Years of education: Not on file  . Highest education level: Not on file  Occupational History  . Occupation: retired  Tobacco Use  . Smoking status: Former Smoker    Packs/day: 0.25    Years: 15.00    Pack years: 3.75    Types: Cigarettes    Quit date: 02/08/1980    Years since quitting: 39.5  . Smokeless tobacco: Former Network engineer  . Vaping Use: Never used  Substance and Sexual Activity  . Alcohol use: Yes     Alcohol/week: 0.0 standard drinks    Comment: occassional/social/rare  . Drug use: Not Currently  . Sexual activity: Not Currently  Other Topics Concern  . Not on file  Social History Narrative  . Not on file   Social Determinants of Health   Financial Resource Strain: Low Risk   . Difficulty of Paying Living Expenses: Not hard at all  Food Insecurity: No Food Insecurity  . Worried About [employer] of  Food in the Last Year: Never true  . Ran Out of Food in the Last Year: Never true  Transportation Needs: No Transportation Needs  . Lack of Transportation (Medical): No  . Lack of Transportation (Non-Medical): No  Physical Activity: Inactive  . Days of Exercise per Week: 0 days  . Minutes of Exercise per Session: 0 min  Stress: No Stress Concern Present  . Feeling of Stress : Not at all  Social Connections: Socially Isolated  . Frequency of Communication with Friends and Family: More than three times a week  . Frequency of Social Gatherings with Friends and Family: Once a week  . Attends Religious Services: Never  . Active Member of Clubs or Organizations: No  . Attends Archivist Meetings: Never  . Marital Status: Widowed    Outpatient Encounter Medications as of 07/31/2019  Medication Sig  . acetaminophen (TYLENOL) 500 MG tablet Take 1 tablet (500 mg total) by mouth every 6 (six) hours as needed.  Marland Kitchen albuterol (PROAIR HFA) 108 (90 Base) MCG/ACT inhaler Inhale 2 puffs into the lungs every 6 (six) hours as needed for wheezing or shortness of breath.  Marland Kitchen aspirin 81 MG tablet Take 1 tablet (81 mg total) by mouth daily with breakfast.  . atorvastatin (LIPITOR) 20 MG tablet Take 1 tablet (20 mg total) by mouth daily at 6 PM.  . Calcium Carbonate-Vitamin D (CALCIUM 600+D) 600-400 MG-UNIT tablet Take 1 tablet by mouth daily.  . carvedilol (COREG) 25 MG tablet Take 1 tablet (25 mg total) by mouth 2 (two) times daily.  . Cholecalciferol (VITAMIN D3) 1.25 MG (50000 UT) CAPS Take  1 capsule by mouth once a week.  . Clobetasol Prop Emollient Base (CLOBETASOL PROPIONATE E) 0.05 % emollient cream Apply 1 application topically daily as needed (skin irritations).  . ferrous sulfate (FEROSUL) 325 (65 FE) MG tablet Take 325 mg by mouth daily with breakfast.  . furosemide (LASIX) 40 MG tablet TAKE 1 TAB DAILY, TAKE AN ADDITIONAL TAB AT 2 PM IF INCREASED SWELLING AND/OR SHORTNESS OF BREATH  . lisinopril (ZESTRIL) 20 MG tablet Take 1 tablet (20 mg total) by mouth daily.  . mometasone (NASONEX) 50 MCG/ACT nasal spray Place 2 sprays into the nose daily.  . montelukast (SINGULAIR) 10 MG tablet Take 1 tablet (10 mg total) by mouth at bedtime.  . sitaGLIPtin (JANUVIA) 100 MG tablet Take 1 tablet (100 mg total) by mouth daily.  . vitamin C (ASCORBIC ACID) 500 MG tablet Take 500 mg by mouth daily.    No facility-administered encounter medications on file as of 07/31/2019.    Activities of Daily Living In your present state of health, do you have any difficulty performing the following activities: 07/31/2019 12/24/2018  Hearing? N N  Vision? N N  Difficulty concentrating or making decisions? N N  Walking or climbing stairs? Y Y  Comment stairs -  Dressing or bathing? N N  Doing errands, shopping? N N  Preparing Food and eating ? N -  Using the Toilet? N -  In the past six months, have you accidently leaked urine? N -  Do you have problems with loss of bowel control? N -  Managing your Medications? N -  Managing your Finances? N -  Housekeeping or managing your Housekeeping? N -  Some recent data might be hidden    Patient Care Team: [name], [name], NP as PCP - General (Internal Medicine) Leonie Man, MD as PCP - Cardiology (Cardiology) Daron Offer  A, [place] as Pharmacist (Pharmacist)    Assessment:   This is a routine wellness examination for [place].  Exercise Activities and Dietary recommendations Current Exercise Habits: The patient does not participate  in regular exercise at present (plans to start going to the gym soon)  Goals Addressed            This Visit's Progress   . Weight (lb) < 200 lb (90.7 kg)       Using healthy weight & wellness program & going to the gym       Fall Risk: Fall Risk  07/31/2019 02/28/2019 11/28/2018 07/04/2018 03/27/2018  Falls in the past year? 0 0 0 0 0  Number falls in past yr: 0 - - - -  Injury with Fall? 0 - - - -  Risk for fall due to : No Fall Risks - - - -  Risk for fall due to: Comment - - - - -  Follow up Falls prevention discussed - - - -    FALL RISK PREVENTION PERTAINING TO THE HOME:  Any stairs in or around the home? No    Home free of loose throw rugs in walkways, pet beds, electrical cords, etc? Yes  Adequate lighting in your home to reduce risk of falls? Yes   ASSISTIVE DEVICES UTILIZED TO PREVENT FALLS:  Life alert? No  Use of a cane, walker or w/c? No  Grab bars in the bathroom? Yes  Shower chair or bench in shower? No  Elevated toilet seat or a handicapped toilet? No   TIMED UP AND GO:  Was the test performed? Yes .  Length of time to ambulate 10 feet: 11 sec.   GAIT:  Appearance of gait:Gait slow and steady without the use of an assistive device.  Education: Fall risk prevention has been discussed.  Intervention(s) required? No   DME/home health order needed?  No    Depression Screen PHQ 2/9 Scores 07/31/2019 03/25/2019 02/28/2019 11/28/2018  PHQ - 2 Score 0 4 0 0  PHQ- 9 Score - 20 - 0  Exception Documentation - Medical reason - -     Cognitive Function MMSE - Mini Mental State Exam 11/28/2018  Orientation to time 5  Orientation to Place 5  Registration 3  Attention/ Calculation 5  Recall 3  Language- name 2 objects 2  Language- repeat 1  Language- follow 3 step command 3  Language- read & follow direction 1  Write a sentence 1  Copy design 1  Total score 30     6CIT Screen 07/31/2019  What Year? 0 points  What month? 0 points  What time? 0  points  Count back from 20 0 points  Months in reverse 0 points  Repeat phrase 0 points  Total Score 0  No cognitive impairment noted. Patient states she reads & plays games on her phone.  Immunization History  Administered Date(s) Administered  . Fluad Quad(high Dose 65+) 11/28/2018  . Influenza Split 11/21/2016  . Influenza,inj,Quad PF,6+ Mos 01/12/2015, 11/19/2015  . PFIZER SARS-COV-2 Vaccination 03/31/2019, 04/23/2019  . Pneumococcal Conjugate-13 01/19/2017  . Pneumococcal Polysaccharide-23 01/12/2015    Qualifies for Shingles Vaccine? Yes  Zostavax completed never. Due for Shingrix. Education has been provided regarding the importance of this vaccine. Pt has been advised to call insurance company to determine out of pocket expense. Advised may also receive vaccine at local pharmacy or Health Dept. Verbalized acceptance and understanding.  Tdap: Although this vaccine is not a covered  service during a Wellness Exam, does the patient still wish to receive this vaccine today?  No .  Education has been provided regarding the importance of this vaccine. Advised may receive this vaccine at local pharmacy or Health Dept. Aware to provide a copy of the vaccination record if obtained from local pharmacy or Health Dept. Verbalized acceptance and understanding.  Flu Vaccine: Due 10/2019  Pneumococcal Vaccine: Pneumovvax-23 completed 01/12/2015 Prevnar-13 completed 01/19/2017  Covid-19 Vaccine: Completed vaccines 04/23/2019  Screening Tests Health Maintenance  Topic Date Due  . TETANUS/TDAP  02/07/2017  . INFLUENZA VACCINE  09/08/2019  . OPHTHALMOLOGY EXAM  11/01/2019  . FOOT EXAM  11/28/2019  . HEMOGLOBIN A1C  01/04/2020  . PNA vac Low Risk Adult (2 of 2 - PPSV23) 01/12/2020  . Fecal DNA (Cologuard)  09/01/2020  . MAMMOGRAM  06/20/2021  . DEXA SCAN  Completed  . COVID-19 Vaccine  Completed  . Hepatitis C Screening  Completed    Cancer Screenings:  Colorectal Screening: Completed   Cologuard 09/01/2017 . Repeat every 3 years.  Mammogram: Completed 06/21/2019. Repeat every year.  Bone Density: Completed 01/24/2017. Results reflect  OSTEOPENIA. Repeat every 2 years. Ordered today. Pt aware the office will call re: appt.  Lung Cancer Screening: (Low Dose CT Chest recommended if Age 67-80 years, 30 pack-year currently smoking OR have quit w/in 15years.) does not qualify.     Additional Screening:  Hepatitis C Screening:  Completed 12/15/2015  Dental Screening: Recommended annual dental exams for proper oral hygiene   Community Resource Referral:  CRR required this visit?  No       Plan:  I have personally reviewed and addressed the Medicare Annual Wellness questionnaire and have noted the following in the patient's chart:  A. Medical and social history B. Use of alcohol, tobacco or illicit drugs  C. Current medications and supplements D. Functional ability and status E.  Nutritional status F.  Physical activity G. Advance directives H. List of other physicians I.  Hospitalizations, surgeries, and ER visits in previous 12 months J.  [place] such as hearing and vision if needed, cognitive and depression L. Referrals and appointments   In addition, I have reviewed and discussed with patient certain preventive protocols, quality metrics, and best practice recommendations. A written personalized care plan for preventive services as well as general preventive health recommendations were provided to patient.   Signed,    Marta Antu, LPN  [id number]/[id number] Nurse Health Advisor   Nurse Notes: None

## 2019-07-31 ENCOUNTER — Ambulatory Visit: Payer: PPO | Admitting: *Deleted

## 2019-07-31 ENCOUNTER — Ambulatory Visit (INDEPENDENT_AMBULATORY_CARE_PROVIDER_SITE_OTHER): Payer: PPO

## 2019-07-31 VITALS — BP 118/78 | HR 57 | Temp 98.0°F | Resp 16 | Ht 66.0 in | Wt 333.2 lb

## 2019-07-31 DIAGNOSIS — Z0000 Encounter for general adult medical examination without abnormal findings: Secondary | ICD-10-CM

## 2019-07-31 DIAGNOSIS — M8580 Other specified disorders of bone density and structure, unspecified site: Secondary | ICD-10-CM | POA: Diagnosis not present

## 2019-07-31 DIAGNOSIS — Z780 Asymptomatic menopausal state: Secondary | ICD-10-CM

## 2019-07-31 NOTE — Patient Instructions (Signed)
Stacey Page , Thank you for taking time to come for your Medicare Wellness Visit. I appreciate your ongoing commitment to your health goals. Please review the following plan we discussed and let me know if I can assist you in the future.   Screening recommendations/referrals: Colonoscopy: Cologuard completed 09/01/2017-Due 09/01/2020 Mammogram: Completed 06/21/2019-Due 06/20/2020 Bone Density: Completed 01/24/2017- Referral ordered-someone will be calling you to schedule an appointment. Recommended yearly ophthalmology/optometry visit for glaucoma screening and checkup Recommended yearly dental visit for hygiene and checkup  Vaccinations: Influenza vaccine: Up to date-Due 10/2019 Pneumococcal vaccine: Completed vaccines Tdap vaccine: Discuss with pharmacy if interested Shingles vaccine: Discuss with pharmacy if interested  Covid-19:Completed vaccines  Advanced directives: Declined  Conditions/risks identified: See problem list  Next appointment: Follow up in one year for your annual wellness visit    Preventive Care 37 Years and Older, Female Preventive care refers to lifestyle choices and visits with your health care provider that can promote health and wellness. What does preventive care include?  A yearly physical exam. This is also called an annual well check.  Dental exams once or twice a year.  Routine eye exams. Ask your health care provider how often you should have your eyes checked.  Personal lifestyle choices, including:  Daily care of your teeth and gums.  Regular physical activity.  Eating a healthy diet.  Avoiding tobacco and drug use.  Limiting alcohol use.  Practicing safe sex.  Taking low-dose aspirin every day.  Taking vitamin and mineral supplements as recommended by your health care provider. What happens during an annual well check? The services and screenings done by your health care provider during your annual well check will depend on your age,  overall health, lifestyle risk factors, and family history of disease. Counseling  Your health care provider may ask you questions about your:  Alcohol use.  Tobacco use.  Drug use.  Emotional well-being.  Home and relationship well-being.  Sexual activity.  Eating habits.  History of falls.  Memory and ability to understand (cognition).  Work and work Statistician.  Reproductive health. Screening  You may have the following tests or measurements:  Height, weight, and BMI.  Blood pressure.  Lipid and cholesterol levels. These may be checked every 5 years, or more frequently if you are over 34 years old.  Skin check.  Lung cancer screening. You may have this screening every year starting at age 36 if you have a 30-pack-year history of smoking and currently smoke or have quit within the past 15 years.  Fecal occult blood test (FOBT) of the stool. You may have this test every year starting at age 17.  Flexible sigmoidoscopy or colonoscopy. You may have a sigmoidoscopy every 5 years or a colonoscopy every 10 years starting at age 29.  Hepatitis C blood test.  Hepatitis B blood test.  Sexually transmitted disease (STD) testing.  Diabetes screening. This is done by checking your blood sugar (glucose) after you have not eaten for a while (fasting). You may have this done every 1-3 years.  Bone density scan. This is done to screen for osteoporosis. You may have this done starting at age 84.  Mammogram. This may be done every 1-2 years. Talk to your health care provider about how often you should have regular mammograms. Talk with your health care provider about your test results, treatment options, and if necessary, the need for more tests. Vaccines  Your health care provider may recommend certain vaccines, such as:  Influenza  vaccine. This is recommended every year.  Tetanus, diphtheria, and acellular pertussis (Tdap, Td) vaccine. You may need a Td booster every 10  years.  Zoster vaccine. You may need this after age 50.  Pneumococcal 13-valent conjugate (PCV13) vaccine. One dose is recommended after age 7.  Pneumococcal polysaccharide (PPSV23) vaccine. One dose is recommended after age 67. Talk to your health care provider about which screenings and vaccines you need and how often you need them. This information is not intended to replace advice given to you by your health care provider. Make sure you discuss any questions you have with your health care provider. Document Released: 02/20/2015 Document Revised: 10/14/2015 Document Reviewed: 11/25/2014 Elsevier Interactive Patient Education  2017 [place] Prevention in the Home Falls can cause injuries. They can happen to people of all ages. There are many things you can do to make your home safe and to help prevent falls. What can I do on the outside of my home?  Regularly fix the edges of walkways and driveways and fix any cracks.  Remove anything that might make you trip as you walk through a door, such as a raised step or threshold.  Trim any bushes or trees on the path to your home.  Use bright outdoor lighting.  Clear any walking paths of anything that might make someone trip, such as rocks or tools.  Regularly check to see if handrails are loose or broken. Make sure that both sides of any steps have handrails.  Any raised decks and porches should have guardrails on the edges.  Have any leaves, snow, or ice cleared regularly.  Use sand or salt on walking paths during winter.  Clean up any spills in your garage right away. This includes oil or grease spills. What can I do in the bathroom?  Use night lights.  Install grab bars by the toilet and in the tub and shower. Do not use towel bars as grab bars.  Use non-skid mats or decals in the tub or shower.  If you need to sit down in the shower, use a plastic, non-slip stool.  Keep the floor dry. Clean up any water that  spills on the floor as soon as it happens.  Remove soap buildup in the tub or shower regularly.  Attach bath mats securely with double-sided non-slip rug tape.  Do not have throw rugs and other things on the floor that can make you trip. What can I do in the bedroom?  Use night lights.  Make sure that you have a light by your bed that is easy to reach.  Do not use any sheets or blankets that are too big for your bed. They should not hang down onto the floor.  Have a firm chair that has side arms. You can use this for support while you get dressed.  Do not have throw rugs and other things on the floor that can make you trip. What can I do in the kitchen?  Clean up any spills right away.  Avoid walking on wet floors.  Keep items that you use a lot in easy-to-reach places.  If you need to reach something above you, use a strong step stool that has a grab bar.  Keep electrical cords out of the way.  Do not use floor polish or wax that makes floors slippery. If you must use wax, use non-skid floor wax.  Do not have throw rugs and other things on the floor that can  make you trip. What can I do with my stairs?  Do not leave any items on the stairs.  Make sure that there are handrails on both sides of the stairs and use them. Fix handrails that are broken or loose. Make sure that handrails are as long as the stairways.  Check any carpeting to make sure that it is firmly attached to the stairs. Fix any carpet that is loose or worn.  Avoid having throw rugs at the top or bottom of the stairs. If you do have throw rugs, attach them to the floor with carpet tape.  Make sure that you have a light switch at the top of the stairs and the bottom of the stairs. If you do not have them, ask someone to add them for you. What else can I do to help prevent falls?  Wear shoes that:  Do not have high heels.  Have rubber bottoms.  Are comfortable and fit you well.  Are closed at the  toe. Do not wear sandals.  If you use a stepladder:  Make sure that it is fully opened. Do not climb a closed stepladder.  Make sure that both sides of the stepladder are locked into place.  Ask someone to hold it for you, if possible.  Clearly mark and make sure that you can see:  Any grab bars or handrails.  First and last steps.  Where the edge of each step is.  Use tools that help you move around (mobility aids) if they are needed. These include:  Canes.  Walkers.  Scooters.  Crutches.  Turn on the lights when you go into a dark area. Replace any light bulbs as soon as they burn out.  Set up your furniture so you have a clear path. Avoid moving your furniture around.  If any of your floors are uneven, fix them.  If there are any pets around you, be aware of where they are.  Review your medicines with your doctor. Some medicines can make you feel dizzy. This can increase your chance of falling. Ask your doctor what other things that you can do to help prevent falls. This information is not intended to replace advice given to you by your health care provider. Make sure you discuss any questions you have with your health care provider. Document Released: 11/20/2008 Document Revised: 07/02/2015 Document Reviewed: 02/28/2014 Elsevier Interactive Patient Education  2017 [employer].

## 2019-08-01 ENCOUNTER — Other Ambulatory Visit (INDEPENDENT_AMBULATORY_CARE_PROVIDER_SITE_OTHER): Payer: Self-pay | Admitting: Bariatrics

## 2019-08-01 ENCOUNTER — Telehealth: Payer: Self-pay

## 2019-08-01 ENCOUNTER — Ambulatory Visit: Payer: Self-pay

## 2019-08-01 DIAGNOSIS — E785 Hyperlipidemia, unspecified: Secondary | ICD-10-CM

## 2019-08-01 DIAGNOSIS — E1169 Type 2 diabetes mellitus with other specified complication: Secondary | ICD-10-CM

## 2019-08-01 DIAGNOSIS — J309 Allergic rhinitis, unspecified: Secondary | ICD-10-CM

## 2019-08-01 DIAGNOSIS — E559 Vitamin D deficiency, unspecified: Secondary | ICD-10-CM

## 2019-08-01 MED ORDER — VITAMIN D3 1.25 MG (50000 UT) PO CAPS: 1.0000 | ORAL_CAPSULE | ORAL | 0 refills | Status: DC

## 2019-08-01 NOTE — Chronic Care Management (AMB) (Signed)
Reviewed chart for medication changes ahead of medication coordination call.   07/31/19: Presented for AWV 07/25/19: Presented to Dr. Owens Shark  07/04/19: Presented to Dr. Owens Shark  No medication changes indicated  BP Readings from Last 3 Encounters:  07/31/19 118/78  07/25/19 117/72  07/04/19 129/60    Lab Results  Component Value Date   HGBA1C 5.9 (H) 07/04/2019     Patient obtains medications through Vials  90 Days   Last adherence delivery included: n/a  Patient is due for next adherence delivery on: 08/06/19. Called patient and reviewed medications and coordinated delivery.  This delivery to include:  Ergocalciferol 50,000 units weekly   Carvedilol 25 mg twice daily  Montelukast 10 mg daily   Lisinopril 20 mg daily   Patient declined the following medications (meds) due to (reason)  APAP (gets OTC)  Albuterol inhaler (not currently using)   Aspirin 81 (gets OTC)  Atorvastatin (Patient believes she [facility] extra supply [facility]   previously, but takes daily, has about ~50 tablets remaining)  Calcium + Vit D (gets OTC)  Clobetasol (no longer taking)  Ferrous sulfate  (gets OTC)  Furosemide (patient receives extra supply for PRN use, still has   ~58 tablets remaining)  Januvia (gets for free through her work insurance)   Nasonex (gets OTC)  Vitamin C (gets OTC)  Patient needs refills for Ergocalciferol. Request sent to specialist Betsy Pries office to request additional refill. Montelukast. Refill request sent to PCP.  Confirmed delivery date of 08/06/19, advised patient that pharmacy will contact them the morning of delivery.  [place] [facility]  [phone]

## 2019-08-01 NOTE — Telephone Encounter (Signed)
Dr. Owens Shark,   Would you be willing to send in another 30 day supply of ergocalciferol for your patient? We are trying to synchronize all of her medications together for a medicine delivery, and we need a new prescription to send it with the rest of her medicines on her scheduled delivery date of 08/06/19.   Thanks,  Doristine Section Clinical Pharmacist Velora Heckler at [facility]  [phone]

## 2019-08-01 NOTE — Telephone Encounter (Signed)
No, I will send over another prescription now to her pharmacy.

## 2019-08-01 NOTE — Telephone Encounter (Signed)
Patient is due for refill of Montelukast 10 mg. Please send for 90 day supply to [facility].   Thanks, Doristine Section Clinical Pharmacist Velora Heckler at [facility]  [phone]

## 2019-08-02 MED ORDER — MONTELUKAST SODIUM 10 MG PO TABS: 10.0000 mg | ORAL_TABLET | Freq: Every day | ORAL | 3 refills | Status: DC

## 2019-08-02 NOTE — Telephone Encounter (Signed)
Ok to send with 3refills

## 2019-08-02 NOTE — Addendum Note (Signed)
Addended by: Lucila Maine on: 08/02/2019 11:16 AM   Modules accepted: Orders

## 2019-08-02 NOTE — Telephone Encounter (Signed)
[place] please advise. Pt asking for refill per Cristie Hem for:  Montelkulast 90 tabs     0-refills Last fill-05/30/19 Last VV-02/28/19  Pt picked up last refill to last till 7/22.

## 2019-08-02 NOTE — Telephone Encounter (Signed)
Rx sent, pt notified. 

## 2019-08-08 MED ORDER — VITAMIN D3 1.25 MG (50000 UT) PO CAPS: 1.0000 | ORAL_CAPSULE | ORAL | 0 refills | Status: DC

## 2019-08-15 ENCOUNTER — Telehealth (INDEPENDENT_AMBULATORY_CARE_PROVIDER_SITE_OTHER): Payer: Self-pay | Admitting: Bariatrics

## 2019-08-15 ENCOUNTER — Ambulatory Visit (INDEPENDENT_AMBULATORY_CARE_PROVIDER_SITE_OTHER): Payer: PPO | Admitting: Bariatrics

## 2019-08-15 ENCOUNTER — Encounter (INDEPENDENT_AMBULATORY_CARE_PROVIDER_SITE_OTHER): Payer: Self-pay | Admitting: Bariatrics

## 2019-08-15 ENCOUNTER — Other Ambulatory Visit: Payer: Self-pay

## 2019-08-15 VITALS — BP 142/77 | HR 58 | Temp 97.7°F | Ht 66.0 in | Wt 332.0 lb

## 2019-08-15 DIAGNOSIS — Z6841 Body Mass Index (BMI) 40.0 and over, adult: Secondary | ICD-10-CM

## 2019-08-15 DIAGNOSIS — E1169 Type 2 diabetes mellitus with other specified complication: Secondary | ICD-10-CM

## 2019-08-15 DIAGNOSIS — N184 Chronic kidney disease, stage 4 (severe): Secondary | ICD-10-CM

## 2019-08-15 DIAGNOSIS — E7849 Other hyperlipidemia: Secondary | ICD-10-CM

## 2019-08-15 DIAGNOSIS — E669 Obesity, unspecified: Secondary | ICD-10-CM

## 2019-08-15 NOTE — Telephone Encounter (Signed)
Patient states the referral that was just done for her was to a different city.  She wants something in [place]

## 2019-08-15 NOTE — Telephone Encounter (Signed)
k

## 2019-08-15 NOTE — Addendum Note (Signed)
Addended by: Lennette Bihari A on: 08/15/2019 04:42 PM   Modules accepted: Orders

## 2019-08-15 NOTE — Progress Notes (Signed)
Chief Complaint:   OBESITY Stacey Stacey is here to discuss her progress with her obesity treatment plan along with follow-up of her obesity related diagnoses. Stacey Stacey is on the Category 3 Plan and states she is following her eating plan approximately 0% of the time. Stacey Stacey states she is exercising 0 minutes 0 times per week.  Today's visit was #: 9 Starting weight: 344 lbs Starting date: 03/25/2019 Today's weight: 332 lbs Today's date: 08/15/2019 Total lbs lost to date: 12 Total lbs lost since last in-office visit: 0  Interim History: Stacey Stacey's weight remains the same. She states she is using the air fryer and reports getting adequate water intake.  Subjective:   Type 2 diabetes mellitus with obesity (Stacey Stacey). Stacey Stacey is taking Januvia.   Lab Results  Component Value Date   HGBA1C 5.9 (H) 07/04/2019   HGBA1C 6.0 (H) 03/25/2019   HGBA1C 6.1 11/28/2018   Lab Results  Component Value Date   MICROALBUR 1.9 01/16/2018   LDLCALC 53 07/04/2019   CREATININE 2.08 (H) 07/04/2019   Lab Results  Component Value Date   INSULIN 9.2 07/04/2019   INSULIN 8.8 03/25/2019   Other hyperlipidemia. Stacey Stacey is taking Lipitor.   Lab Results  Component Value Date   CHOL 128 07/04/2019   HDL 61 07/04/2019   LDLCALC 53 07/04/2019   TRIG 66 07/04/2019   CHOLHDL 3 11/28/2018   Lab Results  Component Value Date   ALT 9 07/04/2019   AST 12 07/04/2019   ALKPHOS 78 07/04/2019   BILITOT 0.4 07/04/2019   The ASCVD Risk score Stacey Stacey DC Jr., et al., 2013) failed to calculate for the following reasons:   The valid total cholesterol range is 130 to 320 mg/dL  CKD (chronic kidney disease), stage IV (Stacey Stacey). Stacey Stacey was referred to a nephrologist at her last visit (referral sent). GFR 28 on 07/04/2019.  Assessment/Plan:   Type 2 diabetes mellitus with obesity (Stacey Stacey). Good blood sugar control is important to decrease the likelihood of diabetic complications such as nephropathy,  neuropathy, limb loss, blindness, coronary artery disease, and death. Intensive lifestyle modification including diet, exercise and weight loss are the first line of treatment for diabetes. Stacey Stacey will continue her medication as directed.   Other hyperlipidemia. Cardiovascular risk and specific lipid/LDL goals reviewed.  We discussed several lifestyle modifications today and Stacey Stacey will continue to work on diet, exercise and weight loss efforts. Orders and follow up as documented in patient record. She will continue her medication as directed.   Counseling Intensive lifestyle modifications are the first line treatment for this issue. . Dietary changes: Increase soluble fiber. Decrease simple carbohydrates. . Exercise changes: Moderate to vigorous-intensity aerobic activity 150 minutes per week if tolerated. . Lipid-lowering medications: see documented in medical record.  CKD (chronic kidney disease), stage IV (Stacey Stacey). Will check on referral to neurologist.  Class 3 severe obesity with serious comorbidity and body mass index (BMI) of 50.0 to 59.9 in adult, unspecified obesity type (Stacey Stacey).  Stacey Stacey is currently in the action stage of change. As such, her goal is to continue with weight loss efforts. She has agreed to the Category 3 Plan.   She will work on meal planning, intentional eating, and will stay more adherent to the plan.   Handout was provided on [employer].  Exercise goals: Older adults should follow the adult guidelines. When older adults cannot meet the adult guidelines, they should be as physically active as their abilities and conditions will allow.   Behavioral  modification strategies: increasing lean protein intake, decreasing simple carbohydrates, increasing vegetables, increasing water intake, decreasing eating out, no skipping meals, meal planning and cooking strategies, keeping healthy foods in the home and planning for success.  Stacey Stacey has agreed to follow-up with  our clinic in 2 weeks. She was informed of the importance of frequent follow-up visits to maximize her success with intensive lifestyle modifications for her multiple health conditions.   Objective:   Blood pressure (!) 142/77, pulse (!) 58, temperature 97.7 F (36.5 C), height 5\' 6"  (1.676 m), weight (!) 332 lb (150.6 kg), SpO2 96 %. Body mass index is 53.59 kg/m.  General: Cooperative, alert, well developed, in no acute distress. HEENT: Conjunctivae and lids unremarkable. Cardiovascular: Regular rhythm.  Lungs: Normal work of breathing. Neurologic: No focal deficits.   Lab Results  Component Value Date   CREATININE 2.08 (H) 07/04/2019   BUN 26 07/04/2019   NA 139 07/04/2019   K 4.2 07/04/2019   CL 99 07/04/2019   CO2 26 07/04/2019   Lab Results  Component Value Date   ALT 9 07/04/2019   AST 12 07/04/2019   ALKPHOS 78 07/04/2019   BILITOT 0.4 07/04/2019   Lab Results  Component Value Date   HGBA1C 5.9 (H) 07/04/2019   HGBA1C 6.0 (H) 03/25/2019   HGBA1C 6.1 11/28/2018   HGBA1C 6.2 01/16/2018   HGBA1C 5.9 01/19/2017   Lab Results  Component Value Date   INSULIN 9.2 07/04/2019   INSULIN 8.8 03/25/2019   Lab Results  Component Value Date   TSH 0.51 11/28/2018   Lab Results  Component Value Date   CHOL 128 07/04/2019   HDL 61 07/04/2019   LDLCALC 53 07/04/2019   TRIG 66 07/04/2019   CHOLHDL 3 11/28/2018   Lab Results  Component Value Date   WBC 4.8 12/24/2018   HGB 12.7 12/24/2018   HCT 41.4 12/24/2018   MCV 95.8 12/24/2018   PLT 213 12/24/2018   No results found for: IRON, TIBC, FERRITIN  Obesity Behavioral Intervention Documentation for Insurance:   Approximately 15 minutes were spent on the discussion below.  ASK: We discussed the diagnosis of obesity with Stacey Stacey today and Stacey Stacey agreed to give [name] permission to discuss obesity behavioral modification therapy today.  ASSESS: Stacey Stacey has the diagnosis of obesity and her BMI today is 53.6.  Stacey Stacey is in the action stage of change.   ADVISE: Stacey Stacey was educated on the multiple health risks of obesity as well as the benefit of weight loss to improve her health. She was advised of the need for long term treatment and the importance of lifestyle modifications to improve her current health and to decrease her risk of future health problems.  AGREE: Multiple dietary modification options and treatment options were discussed and Stacey Stacey agreed to follow the recommendations documented in the above note.  ARRANGE: Stacey Stacey was educated on the importance of frequent visits to treat obesity as outlined per CMS and USPSTF guidelines and agreed to schedule her next follow up appointment today.  Attestation Statements:   Reviewed by clinician on day of visit: allergies, medications, problem list, medical history, surgical history, family history, social history, and previous encounter notes.  Migdalia Dk, am acting as Location manager for [employer], DO   I have reviewed the above documentation for accuracy and completeness, and I agree with the above. Jearld Lesch, DO

## 2019-08-30 ENCOUNTER — Ambulatory Visit: Payer: Self-pay

## 2019-08-30 DIAGNOSIS — I10 Essential (primary) hypertension: Secondary | ICD-10-CM

## 2019-08-30 DIAGNOSIS — E1151 Type 2 diabetes mellitus with diabetic peripheral angiopathy without gangrene: Secondary | ICD-10-CM

## 2019-08-30 NOTE — Chronic Care Management (AMB) (Signed)
Reviewed chart for medication changes ahead of medication coordination call.  08/15/19: Presented to Dr. Owens Shark.No medication changes indicated.  BP Readings from Last 3 Encounters:  08/15/19 (!) 142/77  07/31/19 118/78  07/25/19 117/72    Lab Results  Component Value Date   HGBA1C 5.9 (H) 07/04/2019     Patient obtains medications through Vials  90 Days   Last adherence delivery included:   Ergocalciferol 50,000 units weekly (90DS)             Carvedilol 25 mg twice daily (90DS)             Montelukast 10 mg daily (30DS)             Lisinopril 20 mg daily (90DS)  Patient is due for next adherence delivery on: 09/02/19 Called patient and reviewed medications and coordinated delivery.  This delivery to include:   Montelukast 10 mg daily  Patient declined the following medications (meds) due to (reason) APAP (gets OTC)             Albuterol inhaler (not currently using)              Aspirin 81 (gets OTC)             Atorvastatin ([facility] ~35 tablets on hand).              Calcium + Vit D (gets OTC)             Clobetasol (no longer taking)             Ferrous sulfate (gets OTC)             Furosemide (patient receives extra supply for PRN use, still has ~30 tablets remaining)             Januvia (gets for free through her work insurance)              Nasonex (gets OTC)             Vitamin C (gets OTC)  Confirmed delivery date of 09/02/19, advised patient that pharmacy will contact them the morning of delivery.  [place] at [facility]  [phone]

## 2019-09-03 ENCOUNTER — Encounter (INDEPENDENT_AMBULATORY_CARE_PROVIDER_SITE_OTHER): Payer: Self-pay | Admitting: Bariatrics

## 2019-09-03 ENCOUNTER — Other Ambulatory Visit: Payer: Self-pay

## 2019-09-03 ENCOUNTER — Ambulatory Visit (INDEPENDENT_AMBULATORY_CARE_PROVIDER_SITE_OTHER): Payer: PPO | Admitting: Bariatrics

## 2019-09-03 VITALS — BP 138/86 | HR 58 | Temp 97.8°F | Ht 66.0 in | Wt 335.0 lb

## 2019-09-03 DIAGNOSIS — N184 Chronic kidney disease, stage 4 (severe): Secondary | ICD-10-CM

## 2019-09-03 DIAGNOSIS — I10 Essential (primary) hypertension: Secondary | ICD-10-CM | POA: Diagnosis not present

## 2019-09-03 DIAGNOSIS — Z6841 Body Mass Index (BMI) 40.0 and over, adult: Secondary | ICD-10-CM

## 2019-09-03 NOTE — Progress Notes (Signed)
Chief Complaint:   OBESITY Stacey Page is here to discuss her progress with her obesity treatment plan along with follow-up of her obesity related diagnoses. Stacey Page is on the Category 3 Plan and states she is following her eating plan approximately 0% of the time. Stacey Page states she is exercising 0 minutes 0 times per week.  Today's visit was #: 10 Starting weight: 344 lbs Starting date: 03/25/2019 Today's weight: 335 lbs Today's date: 09/03/2019 Total lbs lost to date: 9 Total lbs lost since last in-office visit: 0  Interim History: Stacey Page is up 3 lbs, but has done well overall. She is up about 2 lbs of water. She reports getting adequate water intake.  Subjective:   CKD (chronic kidney disease), stage IV ([place]). Stacey Page has been referred to [facility], but she has not heard back from them. She called without an answer.  Essential hypertension. Stacey Page is taking Coreg, Zestril, and Lasix as needed. Blood pressure is reasonably well controlled at 138/86 on today's visit.  BP Readings from Last 3 Encounters:  09/03/19 (!) 138/86  08/15/19 (!) 142/77  07/31/19 118/78   Lab Results  Component Value Date   CREATININE 2.08 (H) 07/04/2019   CREATININE 1.57 (H) 12/24/2018   CREATININE 1.18 11/28/2018   Assessment/Plan:   CKD (chronic kidney disease), stage IV ([place]). Lennette Bihari, CMA, will call today to check on the status of the referral.  Essential hypertension. Stacey Page is working on healthy weight loss and exercise to improve blood pressure control. We will watch for signs of hypotension as she continues her lifestyle modifications. She will continue her medications as directed.   Class 3 severe obesity with serious comorbidity and body mass index (BMI) of 50.0 to 59.9 in adult, unspecified obesity type ([place]).  Stacey Page is currently in the action stage of change. As such, her goal is to continue with weight loss efforts. She has agreed to the Category 3  Plan.   She will work on meal planning, intentional eating, and increasing her water intake. She will avoid missing meals.  Exercise goals: Older adults should follow the adult guidelines. When older adults cannot meet the adult guidelines, they should be as physically active as their abilities and conditions will allow.   Behavioral modification strategies: increasing lean protein intake, decreasing simple carbohydrates, increasing vegetables, increasing water intake, decreasing eating out, no skipping meals, meal planning and cooking strategies and keeping healthy foods in the home.  Stacey Page has agreed to follow-up with our clinic in 2-3 weeks. She was informed of the importance of frequent follow-up visits to maximize her success with intensive lifestyle modifications for her multiple health conditions.   Objective:   Blood pressure (!) 138/86, pulse 58, temperature 97.8 F (36.6 C), height 5\' 6"  (1.676 m), weight (!) 335 lb (152 kg), SpO2 98 %. Body mass index is 54.07 kg/m.  General: Cooperative, alert, well developed, in no acute distress. HEENT: Conjunctivae and lids unremarkable. Cardiovascular: Regular rhythm.  Lungs: Normal work of breathing. Neurologic: No focal deficits.   Lab Results  Component Value Date   CREATININE 2.08 (H) 07/04/2019   BUN 26 07/04/2019   NA 139 07/04/2019   K 4.2 07/04/2019   CL 99 07/04/2019   CO2 26 07/04/2019   Lab Results  Component Value Date   ALT 9 07/04/2019   AST 12 07/04/2019   ALKPHOS 78 07/04/2019   BILITOT 0.4 07/04/2019   Lab Results  Component Value Date   HGBA1C 5.9 (H)  07/04/2019   HGBA1C 6.0 (H) 03/25/2019   HGBA1C 6.1 11/28/2018   HGBA1C 6.2 01/16/2018   HGBA1C 5.9 01/19/2017   Lab Results  Component Value Date   INSULIN 9.2 07/04/2019   INSULIN 8.8 03/25/2019   Lab Results  Component Value Date   TSH 0.51 11/28/2018   Lab Results  Component Value Date   CHOL 128 07/04/2019   HDL 61 07/04/2019    LDLCALC 53 07/04/2019   TRIG 66 07/04/2019   CHOLHDL 3 11/28/2018   Lab Results  Component Value Date   WBC 4.8 12/24/2018   HGB 12.7 12/24/2018   HCT 41.4 12/24/2018   MCV 95.8 12/24/2018   PLT 213 12/24/2018   No results found for: IRON, TIBC, FERRITIN  Obesity Behavioral Intervention Documentation for Insurance:   Approximately 15 minutes were spent on the discussion below.  ASK: We discussed the diagnosis of obesity with [name] today and [name] agreed to give [name] permission to discuss obesity behavioral modification therapy today.  ASSESS: Stacey Page has the diagnosis of obesity and her BMI today is 54.1. Stacey Page is in the action stage of change.   ADVISE: Stacey Page was educated on the multiple health risks of obesity as well as the benefit of weight loss to improve her health. She was advised of the need for long term treatment and the importance of lifestyle modifications to improve her current health and to decrease her risk of future health problems.  AGREE: Multiple dietary modification options and treatment options were discussed and Irem agreed to follow the recommendations documented in the above note.  ARRANGE: [name] was educated on the importance of frequent visits to treat obesity as outlined per CMS and USPSTF guidelines and agreed to schedule her next follow up appointment today.  Attestation Statements:   Reviewed by clinician on day of visit: allergies, medications, problem list, medical history, surgical history, family history, social history, and previous encounter notes.  Migdalia Dk, am acting as Location manager for [employer], DO   I have reviewed the above documentation for accuracy and completeness, and I agree with the above. Jearld Lesch, DO

## 2019-09-11 ENCOUNTER — Other Ambulatory Visit: Payer: Self-pay | Admitting: Nephrology

## 2019-09-11 DIAGNOSIS — N179 Acute kidney failure, unspecified: Secondary | ICD-10-CM | POA: Diagnosis not present

## 2019-09-11 DIAGNOSIS — N1832 Chronic kidney disease, stage 3b: Secondary | ICD-10-CM | POA: Diagnosis not present

## 2019-09-11 DIAGNOSIS — I5032 Chronic diastolic (congestive) heart failure: Secondary | ICD-10-CM | POA: Diagnosis not present

## 2019-09-11 DIAGNOSIS — N184 Chronic kidney disease, stage 4 (severe): Secondary | ICD-10-CM | POA: Diagnosis not present

## 2019-09-11 DIAGNOSIS — E1122 Type 2 diabetes mellitus with diabetic chronic kidney disease: Secondary | ICD-10-CM | POA: Diagnosis not present

## 2019-09-11 DIAGNOSIS — I129 Hypertensive chronic kidney disease with stage 1 through stage 4 chronic kidney disease, or unspecified chronic kidney disease: Secondary | ICD-10-CM | POA: Diagnosis not present

## 2019-09-19 ENCOUNTER — Other Ambulatory Visit: Payer: Self-pay

## 2019-09-19 ENCOUNTER — Ambulatory Visit (INDEPENDENT_AMBULATORY_CARE_PROVIDER_SITE_OTHER): Payer: PPO | Admitting: Bariatrics

## 2019-09-19 ENCOUNTER — Telehealth: Payer: Self-pay

## 2019-09-19 ENCOUNTER — Encounter (INDEPENDENT_AMBULATORY_CARE_PROVIDER_SITE_OTHER): Payer: Self-pay | Admitting: Bariatrics

## 2019-09-19 VITALS — BP 111/51 | HR 58 | Temp 97.9°F | Ht 66.0 in | Wt 333.0 lb

## 2019-09-19 DIAGNOSIS — I10 Essential (primary) hypertension: Secondary | ICD-10-CM

## 2019-09-19 DIAGNOSIS — N184 Chronic kidney disease, stage 4 (severe): Secondary | ICD-10-CM | POA: Diagnosis not present

## 2019-09-19 DIAGNOSIS — E1169 Type 2 diabetes mellitus with other specified complication: Secondary | ICD-10-CM | POA: Diagnosis not present

## 2019-09-19 DIAGNOSIS — E1151 Type 2 diabetes mellitus with diabetic peripheral angiopathy without gangrene: Secondary | ICD-10-CM

## 2019-09-19 DIAGNOSIS — E669 Obesity, unspecified: Secondary | ICD-10-CM

## 2019-09-19 DIAGNOSIS — Z6841 Body Mass Index (BMI) 40.0 and over, adult: Secondary | ICD-10-CM

## 2019-09-19 NOTE — Progress Notes (Addendum)
Chronic Care Management Pharmacy Assistant   Name: Stacey Page  MRN: [id number] DOB: 01/20/1951  Reason for Encounter: Medication Review   PCP : Flossie Buffy, NP  Allergies:  No Known Allergies  Medications: Outpatient Encounter Medications as of 09/19/2019  Medication Sig  . acetaminophen (TYLENOL) 500 MG tablet Take 1 tablet (500 mg total) by mouth every 6 (six) hours as needed.  Marland Kitchen albuterol (PROAIR HFA) 108 (90 Base) MCG/ACT inhaler Inhale 2 puffs into the lungs every 6 (six) hours as needed for wheezing or shortness of breath.  Marland Kitchen aspirin 81 MG tablet Take 1 tablet (81 mg total) by mouth daily with breakfast.  . atorvastatin (LIPITOR) 20 MG tablet Take 1 tablet (20 mg total) by mouth daily at 6 PM.  . Calcium Carbonate-Vitamin D (CALCIUM 600+D) 600-400 MG-UNIT tablet Take 1 tablet by mouth daily.  . carvedilol (COREG) 25 MG tablet Take 1 tablet (25 mg total) by mouth 2 (two) times daily.  . Cholecalciferol (VITAMIN D3) 1.25 MG (50000 UT) CAPS Take 1 capsule by mouth once a week.  . Clobetasol Prop Emollient Base (CLOBETASOL PROPIONATE E) 0.05 % emollient cream Apply 1 application topically daily as needed (skin irritations).  . ferrous sulfate (FEROSUL) 325 (65 FE) MG tablet Take 325 mg by mouth daily with breakfast.  . furosemide (LASIX) 40 MG tablet TAKE 1 TAB DAILY, TAKE AN ADDITIONAL TAB AT 2 PM IF INCREASED SWELLING AND/OR SHORTNESS OF BREATH  . lisinopril (ZESTRIL) 20 MG tablet Take 1 tablet (20 mg total) by mouth daily.  . mometasone (NASONEX) 50 MCG/ACT nasal spray Place 2 sprays into the nose daily.  . montelukast (SINGULAIR) 10 MG tablet Take 1 tablet (10 mg total) by mouth at bedtime.  . sitaGLIPtin (JANUVIA) 100 MG tablet Take 1 tablet (100 mg total) by mouth daily.  . vitamin C (ASCORBIC ACID) 500 MG tablet Take 500 mg by mouth daily.    No facility-administered encounter medications on file as of 09/19/2019.    Current Diagnosis: Patient Active  Problem List   Diagnosis Date Noted  . Type 2 DM with CKD stage 4 and hypertension ([place]) 07/09/2019  . Diabetes mellitus type 2 in obese ([place]) 07/02/2019  . Polyneuropathy associated with underlying disease ([place]) 11/28/2018  . Severe episode of recurrent major depressive disorder, without psychotic features ([place]) 11/28/2018  . Type 2 diabetes mellitus with diabetic peripheral angiopathy without gangrene, without long-term current use of insulin ([place]) 11/28/2018  . Poor short term memory 11/28/2018  . Mild aortic stenosis by prior echocardiogram 11/06/2018  . CKD (chronic kidney disease) stage 3, GFR 30-59 ml/min 03/17/2016  . Symptomatic anemia 03/17/2016  . S/P total knee arthroplasty, right 03/17/2016  . Unilateral primary osteoarthritis, right knee   . Hyperlipidemia LDL goal <70 12/15/2015  . Pain in both lower extremities 11/19/2015  . Numbness in both hands 08/13/2015  . Panic attack 06/09/2015  . Right knee pain 05/14/2015  . Chronic renal insufficiency 09/16/2014  . Pneumothorax on right   . Atypical mycobacterium infection   . ILD (interstitial lung disease) 2/2 MAI s/p med rxn   . Routine general medical examination at a health care facility 04/03/2014  . Morbid obesity ([place]) 04/03/2014  . OSA (obstructive sleep apnea) 02/25/2014  . Hypertensive heart disease with chronic diastolic congestive heart failure ([place]) 02/25/2014  . DOE (dyspnea on exertion) 02/25/2014  . Essential hypertension 02/25/2014    Goals Addressed   None     Follow-Up:  Pharmacist  Review   Reviewed chart for medication changes ahead of medication coordination call.  09/19/2019 Bariatrics Owens Shark, Glenard Haring - No new medication or changes to current medication. 09/03/2019 Bariatrics Jearld Lesch - No new medication or changes to current medication.  BP Readings from Last 3 Encounters:  09/03/19 (!) 138/86  08/15/19 (!) 142/77  07/31/19 118/78    Lab Results  Component Value Date   HGBA1C 5.9 (H)  07/04/2019     Patient obtains medications through Vials  90 Days   Last adherence delivery included:  Ergocalciferol 50,000 units weekly (90DS) Carvedilol 25 mg twice daily (90DS) Montelukast 10 mg daily (30DS) Lisinopril 20 mg daily(90DS)  Patient received hydralazine 25 mg and furosemide 40 mg on 09/11/19 for 50 DS.   Called patient and reviewed medications and coordinated delivery.  This delivery to include:None ID   Patient declined the following medications (meds) due to (reason)  APAP (gets OTC) Albuterol inhaler (not currently using)  Aspirin 81 (gets OTC) Atorvastatin (Previous supply from [facility]  30 tablets on hand).  Calcium + Vit D (gets OTC) Clobetasol (no longer taking) Ferrous sulfate(gets OTC)  Januvia (gets for free through her work insurance)  Nasonex (gets OTC) Vitamin C (gets OTC)   Patient needs refills for None ID .  [facility] Pharmacist Assistant [phone]

## 2019-09-23 ENCOUNTER — Other Ambulatory Visit: Payer: Self-pay

## 2019-09-23 ENCOUNTER — Ambulatory Visit (HOSPITAL_BASED_OUTPATIENT_CLINIC_OR_DEPARTMENT_OTHER)
Admission: RE | Admit: 2019-09-23 | Discharge: 2019-09-23 | Disposition: A | Discharge status: Home / Self Care | Payer: PPO | Source: Ambulatory Visit | Attending: Nurse Practitioner | Admitting: Nurse Practitioner

## 2019-09-23 ENCOUNTER — Ambulatory Visit
Admission: RE | Admit: 2019-09-23 | Discharge: 2019-09-23 | Disposition: A | Discharge status: Home / Self Care | Payer: PPO | Source: Ambulatory Visit | Attending: Nephrology | Admitting: Nephrology

## 2019-09-23 DIAGNOSIS — Z780 Asymptomatic menopausal state: Secondary | ICD-10-CM | POA: Insufficient documentation

## 2019-09-23 DIAGNOSIS — Z13820 Encounter for screening for osteoporosis: Secondary | ICD-10-CM | POA: Insufficient documentation

## 2019-09-23 DIAGNOSIS — M8580 Other specified disorders of bone density and structure, unspecified site: Secondary | ICD-10-CM | POA: Insufficient documentation

## 2019-09-23 DIAGNOSIS — N183 Chronic kidney disease, stage 3 unspecified: Secondary | ICD-10-CM | POA: Diagnosis not present

## 2019-09-23 DIAGNOSIS — N281 Cyst of kidney, acquired: Secondary | ICD-10-CM | POA: Diagnosis not present

## 2019-09-23 DIAGNOSIS — E119 Type 2 diabetes mellitus without complications: Secondary | ICD-10-CM | POA: Diagnosis not present

## 2019-09-23 DIAGNOSIS — N1832 Chronic kidney disease, stage 3b: Secondary | ICD-10-CM

## 2019-09-24 ENCOUNTER — Encounter (INDEPENDENT_AMBULATORY_CARE_PROVIDER_SITE_OTHER): Payer: Self-pay | Admitting: Bariatrics

## 2019-09-24 ENCOUNTER — Other Ambulatory Visit: Payer: Self-pay | Admitting: Nurse Practitioner

## 2019-09-24 NOTE — Progress Notes (Signed)
Chief Complaint:   OBESITY Stacey Page is here to discuss her progress with her obesity treatment plan along with follow-up of her obesity related diagnoses. Stacey Page is on the Category 3 Plan and states she is following her eating plan approximately 0% of the time. Stacey Page states she is exercising 0 minutes 0 times per week.  Today's visit was #: 11 Starting weight: 344 lbs Starting date: 03/25/2019 Today's weight: 333 lbs Today's date: 09/19/2019 Total lbs lost to date: 11 Total lbs lost since last in-office visit: 2  Interim History: Stacey Page is down 2 lbs since her last visit. She reports doing well with her water and protein intake.  Subjective:   Essential hypertension. Stacey Page is taking Coreg and Zestril. Blood pressure is controlled.  BP Readings from Last 3 Encounters:  09/19/19 (!) 111/51  09/03/19 (!) 138/86  08/15/19 (!) 142/77   Lab Results  Component Value Date   CREATININE 2.08 (H) 07/04/2019   CREATININE 1.57 (H) 12/24/2018   CREATININE 1.18 11/28/2018   Stage 4 chronic kidney disease (Stacey Page), 3b. Blenda has been to see the nephrologist and Lasix was changed.  Diabetes mellitus type 2 in obese (Stacey Page). Stacey Page is taking Januvia.   Lab Results  Component Value Date   HGBA1C 5.9 (H) 07/04/2019   HGBA1C 6.0 (H) 03/25/2019   HGBA1C 6.1 11/28/2018   Lab Results  Component Value Date   MICROALBUR 1.9 01/16/2018   LDLCALC 53 07/04/2019   CREATININE 2.08 (H) 07/04/2019   Lab Results  Component Value Date   INSULIN 9.2 07/04/2019   INSULIN 8.8 03/25/2019   Assessment/Plan:   Essential hypertension. Gelila is working on healthy weight loss and exercise to improve blood pressure control. We will watch for signs of hypotension as she continues her lifestyle modifications. She will continue her medications as directed.   Stage 4 chronic kidney disease (Stacey Page), 3b. Stacey Page will follow-up with the neurologist. She has a renal ultrasound  scheduled.  Diabetes mellitus type 2 in obese (Stacey Page). Good blood sugar control is important to decrease the likelihood of diabetic complications such as nephropathy, neuropathy, limb loss, blindness, coronary artery disease, and death. Intensive lifestyle modification including diet, exercise and weight loss are the first line of treatment for diabetes. Stacey Page will continue her medication as directed.   Class 3 severe obesity with serious comorbidity and body mass index (BMI) of 50.0 to 59.9 in adult, unspecified obesity type (Stacey Page).  Stacey Page is currently in the action stage of change. As such, her goal is to continue with weight loss efforts. She has agreed to the Category 3 Plan.   She will work on meal planning, intentional eating, and increasing her water intake.   Exercise goals: Stacey Page will walk in the a.m.  Behavioral modification strategies: increasing lean protein intake, decreasing simple carbohydrates, increasing vegetables, increasing water intake, decreasing eating out, no skipping meals, meal planning and cooking strategies, keeping healthy foods in the home and planning for success.  Stacey Page has agreed to follow-up with our clinic in 2-3 weeks. She was informed of the importance of frequent follow-up visits to maximize her success with intensive lifestyle modifications for her multiple health conditions.   Objective:   Blood pressure (!) 111/51, pulse (!) 58, temperature 97.9 F (36.6 C), temperature source Oral, height 5\' 6"  (1.676 m), weight (!) 333 lb (151 kg), SpO2 97 %. Body mass index is 53.75 kg/m.  General: Cooperative, alert, well developed, in no acute distress. HEENT: Conjunctivae and lids unremarkable.  Cardiovascular: Regular rhythm.  Lungs: Normal work of breathing. Neurologic: No focal deficits.   Lab Results  Component Value Date   CREATININE 2.08 (H) 07/04/2019   BUN 26 07/04/2019   NA 139 07/04/2019   K 4.2 07/04/2019   CL 99 07/04/2019    CO2 26 07/04/2019   Lab Results  Component Value Date   ALT 9 07/04/2019   AST 12 07/04/2019   ALKPHOS 78 07/04/2019   BILITOT 0.4 07/04/2019   Lab Results  Component Value Date   HGBA1C 5.9 (H) 07/04/2019   HGBA1C 6.0 (H) 03/25/2019   HGBA1C 6.1 11/28/2018   HGBA1C 6.2 01/16/2018   HGBA1C 5.9 01/19/2017   Lab Results  Component Value Date   INSULIN 9.2 07/04/2019   INSULIN 8.8 03/25/2019   Lab Results  Component Value Date   TSH 0.51 11/28/2018   Lab Results  Component Value Date   CHOL 128 07/04/2019   HDL 61 07/04/2019   LDLCALC 53 07/04/2019   TRIG 66 07/04/2019   CHOLHDL 3 11/28/2018   Lab Results  Component Value Date   WBC 4.8 12/24/2018   HGB 12.7 12/24/2018   HCT 41.4 12/24/2018   MCV 95.8 12/24/2018   PLT 213 12/24/2018   No results found for: IRON, TIBC, FERRITIN  Obesity Behavioral Intervention Documentation for Insurance:   Approximately 15 minutes were spent on the discussion below.  ASK: We discussed the diagnosis of obesity with Stacey Page today and Stacey Page agreed to give [name] permission to discuss obesity behavioral modification therapy today.  ASSESS: Stacey Page has the diagnosis of obesity and her BMI today is 53.8. Stacey Page is in the action stage of change.   ADVISE: Stacey Page was educated on the multiple health risks of obesity as well as the benefit of weight loss to improve her health. She was advised of the need for long term treatment and the importance of lifestyle modifications to improve her current health and to decrease her risk of future health problems.  AGREE: Multiple dietary modification options and treatment options were discussed and Stacey Page agreed to follow the recommendations documented in the above note.  ARRANGE: Stacey Page was educated on the importance of frequent visits to treat obesity as outlined per CMS and USPSTF guidelines and agreed to schedule her next follow up appointment today.  Attestation  Statements:   Reviewed by clinician on day of visit: allergies, medications, problem list, medical history, surgical history, family history, social history, and previous encounter notes.  Migdalia Dk, am acting as Location manager for [employer], DO   I have reviewed the above documentation for accuracy and completeness, and I agree with the above. Jearld Lesch, DO

## 2019-10-07 ENCOUNTER — Ambulatory Visit (INDEPENDENT_AMBULATORY_CARE_PROVIDER_SITE_OTHER): Payer: PPO | Admitting: Bariatrics

## 2019-10-07 ENCOUNTER — Other Ambulatory Visit: Payer: Self-pay

## 2019-10-07 ENCOUNTER — Encounter (INDEPENDENT_AMBULATORY_CARE_PROVIDER_SITE_OTHER): Payer: Self-pay | Admitting: Bariatrics

## 2019-10-07 VITALS — BP 125/82 | HR 55 | Temp 97.9°F | Ht 66.0 in | Wt 336.0 lb

## 2019-10-07 DIAGNOSIS — E1169 Type 2 diabetes mellitus with other specified complication: Secondary | ICD-10-CM | POA: Diagnosis not present

## 2019-10-07 DIAGNOSIS — E669 Obesity, unspecified: Secondary | ICD-10-CM

## 2019-10-07 DIAGNOSIS — Z6841 Body Mass Index (BMI) 40.0 and over, adult: Secondary | ICD-10-CM

## 2019-10-07 DIAGNOSIS — N184 Chronic kidney disease, stage 4 (severe): Secondary | ICD-10-CM | POA: Diagnosis not present

## 2019-10-07 DIAGNOSIS — I10 Essential (primary) hypertension: Secondary | ICD-10-CM

## 2019-10-07 NOTE — Progress Notes (Signed)
Chief Complaint:   OBESITY Stacey Page is here to discuss her progress with her obesity treatment plan along with follow-up of her obesity related diagnoses. Stacey Page is on the Category 3 Plan and states she is following her eating plan approximately 50% of the time. Stacey Page states she is walking 30 minutes 3 times per week.  Today's visit was #: 12 Starting weight: 344 lbs Starting date: 03/25/2019 Today's weight: 336 lbs Today's date: 10/07/2019 Total lbs lost to date: 8 Total lbs lost since last in-office visit: 0  Interim History: Stacey Page is up 3 lbs since her last appointment. She reports doing well with her water and protein intake.  Subjective:   Diabetes mellitus type 2 in obese (Stacey Page). Stacey Page is taking Januvia.  Lab Results  Component Value Date   HGBA1C 5.9 (H) 07/04/2019   HGBA1C 6.0 (H) 03/25/2019   HGBA1C 6.1 11/28/2018   Lab Results  Component Value Date   MICROALBUR 1.9 01/16/2018   LDLCALC 53 07/04/2019   CREATININE 2.08 (H) 07/04/2019   Lab Results  Component Value Date   INSULIN 9.2 07/04/2019   INSULIN 8.8 03/25/2019   Essential hypertension. Stacey Page is taking Coreg and Zestril.  BP Readings from Last 3 Encounters:  10/07/19 125/82  09/19/19 (!) 111/51  09/03/19 (!) 138/86   Lab Results  Component Value Date   CREATININE 2.08 (H) 07/04/2019   CREATININE 1.57 (H) 12/24/2018   CREATININE 1.18 11/28/2018   CKD (chronic kidney disease), stage IV (Stacey Page). Stacey Page reports no changes in her medication.  Assessment/Plan:   Diabetes mellitus type 2 in obese (Stacey Page). Good blood sugar control is important to decrease the likelihood of diabetic complications such as nephropathy, neuropathy, limb loss, blindness, coronary artery disease, and death. Intensive lifestyle modification including diet, exercise and weight loss are the first line of treatment for diabetes. Stacey Page will continue her medication as directed. She will decrease her  intake of carbohydrates and sweets.  Essential hypertension. Stacey Page is working on healthy weight loss and exercise to improve blood pressure control. We will watch for signs of hypotension as she continues her lifestyle modifications. She will continue her medications as directed.   CKD (chronic kidney disease), stage IV (Stacey Page). PCP will continue to manage. Stacey Page will increase her water intake to 64 oz daily.  Class 3 severe obesity with serious comorbidity and body mass index (BMI) of 50.0 to 59.9 in adult, unspecified obesity type (Stacey Page).  Stacey Page is currently in the action stage of change. As such, her goal is to continue with weight loss efforts. She has agreed to keeping a food journal and adhering to recommended goals of 1500 calories and 80-90 grams of protein.   She will work on meal planning, intentional eating, and adhering more closely to the plan.  Exercise goals: Older adults should follow the adult guidelines. When older adults cannot meet the adult guidelines, they should be as physically active as their abilities and conditions will allow.   Behavioral modification strategies: increasing lean protein intake, decreasing simple carbohydrates, increasing vegetables, increasing water intake, decreasing eating out, no skipping meals, meal planning and cooking strategies, keeping healthy foods in the home and planning for success.  Stacey Page has agreed to follow-up with our clinic fasting in 2 weeks. She was informed of the importance of frequent follow-up visits to maximize her success with intensive lifestyle modifications for her multiple health conditions.   Objective:   Blood pressure 125/82, pulse (!) 55, temperature 97.9 F (36.6 C),  height 5\' 6"  (1.676 m), weight (!) 336 lb (152.4 kg), SpO2 94 %. Body mass index is 54.23 kg/m.  General: Cooperative, alert, well developed, in no acute distress. HEENT: Conjunctivae and lids unremarkable. Cardiovascular: Regular  rhythm.  Lungs: Normal work of breathing. Neurologic: No focal deficits.   Lab Results  Component Value Date   CREATININE 2.08 (H) 07/04/2019   BUN 26 07/04/2019   NA 139 07/04/2019   K 4.2 07/04/2019   CL 99 07/04/2019   CO2 26 07/04/2019   Lab Results  Component Value Date   ALT 9 07/04/2019   AST 12 07/04/2019   ALKPHOS 78 07/04/2019   BILITOT 0.4 07/04/2019   Lab Results  Component Value Date   HGBA1C 5.9 (H) 07/04/2019   HGBA1C 6.0 (H) 03/25/2019   HGBA1C 6.1 11/28/2018   HGBA1C 6.2 01/16/2018   HGBA1C 5.9 01/19/2017   Lab Results  Component Value Date   INSULIN 9.2 07/04/2019   INSULIN 8.8 03/25/2019   Lab Results  Component Value Date   TSH 0.51 11/28/2018   Lab Results  Component Value Date   CHOL 128 07/04/2019   HDL 61 07/04/2019   LDLCALC 53 07/04/2019   TRIG 66 07/04/2019   CHOLHDL 3 11/28/2018   Lab Results  Component Value Date   WBC 4.8 12/24/2018   HGB 12.7 12/24/2018   HCT 41.4 12/24/2018   MCV 95.8 12/24/2018   PLT 213 12/24/2018   No results found for: IRON, TIBC, FERRITIN  Obesity Behavioral Intervention Documentation for Insurance:   Approximately 15 minutes were spent on the discussion below.  ASK: We discussed the diagnosis of obesity with Stacey Page today and Stacey Page agreed to give [name] permission to discuss obesity behavioral modification therapy today.  ASSESS: Stacey Page has the diagnosis of obesity and her BMI today is 54.2. Stacey Page is in the action stage of change.   ADVISE: Stacey Page was educated on the multiple health risks of obesity as well as the benefit of weight loss to improve her health. She was advised of the need for long term treatment and the importance of lifestyle modifications to improve her current health and to decrease her risk of future health problems.  AGREE: Multiple dietary modification options and treatment options were discussed and Stacey Page agreed to follow the recommendations documented in  the above note.  ARRANGE: Stacey Page was educated on the importance of frequent visits to treat obesity as outlined per CMS and USPSTF guidelines and agreed to schedule her next follow up appointment today.  Attestation Statements:   Reviewed by clinician on day of visit: allergies, medications, problem list, medical history, surgical history, family history, social history, and previous encounter notes.  Migdalia Dk, am acting as Location manager for [employer], DO   I have reviewed the above documentation for accuracy and completeness, and I agree with the above. Jearld Lesch, DO

## 2019-10-08 ENCOUNTER — Encounter (INDEPENDENT_AMBULATORY_CARE_PROVIDER_SITE_OTHER): Payer: Self-pay | Admitting: Bariatrics

## 2019-10-21 ENCOUNTER — Telehealth (INDEPENDENT_AMBULATORY_CARE_PROVIDER_SITE_OTHER): Payer: PPO | Admitting: Nurse Practitioner

## 2019-10-21 ENCOUNTER — Encounter: Payer: Self-pay | Admitting: Nurse Practitioner

## 2019-10-21 VITALS — BP 250/87 | HR 76 | Ht 66.0 in | Wt 336.0 lb

## 2019-10-21 DIAGNOSIS — J00 Acute nasopharyngitis [common cold]: Secondary | ICD-10-CM | POA: Diagnosis not present

## 2019-10-21 DIAGNOSIS — I160 Hypertensive urgency: Secondary | ICD-10-CM | POA: Diagnosis not present

## 2019-10-21 MED ORDER — BENZONATATE 100 MG PO CAPS: 100.0000 mg | ORAL_CAPSULE | Freq: Three times a day (TID) | ORAL | 0 refills | Status: DC | PRN

## 2019-10-21 MED ORDER — CORICIDIN HBP DAY/NIGHT COLD 10-20 &15-200-2 MG PO MISC: 1.0000 | Freq: Four times a day (QID) | ORAL | Status: DC | PRN

## 2019-10-21 NOTE — Progress Notes (Signed)
Virtual Visit via Video Note  I connected with@ on 10/21/19 at  8:15 AM EDT by a video enabled telemedicine application and verified that I am speaking with the correct person using two identifiers.  Location: Patient:Home Provider: Office Participants: patient and provider   I discussed the limitations of evaluation and management by telemedicine and the availability of in person appointments. I also discussed with the patient that there may be a patient responsible charge related to this service. The patient expressed understanding and agreed to proceed.  CC:Pt c/o chest congestion and nasal congestion x3 days, pt has took otc medication with no relief.   History of Present Illness: HTN: Has not taken any prescribed medications since Friday Has headache this morning.  URI  This is a new problem. The current episode started in the past 7 days. The problem has been unchanged. There has been no fever. Associated symptoms include congestion, coughing, headaches, rhinorrhea and sinus pain. Pertinent negatives include no sore throat or wheezing. She has tried inhaler use, decongestant and acetaminophen for the symptoms. The treatment provided mild relief.   Observations/Objective: Physical Exam Constitutional:      General: She is not in acute distress. Cardiovascular:     Pulses: Normal pulses.  Pulmonary:     Effort: Pulmonary effort is normal.  Neurological:     Mental Status: She is alert and oriented to person, place, and time.    Assessment and Plan: Stacey Page was seen today for acute visit.  Diagnoses and all orders for this visit:  Acute nasopharyngitis -     DM-GG & DM-APAP-CPM (CORICIDIN HBP DAY/NIGHT COLD) 10-20 &15-200-2 MG MISC; Take 1 tablet by mouth every 6 (six) hours as needed. -     benzonatate (TESSALON) 100 MG capsule; Take 1 capsule (100 mg total) by mouth 3 (three) times daily as needed for cough.  Hypertensive urgency   Follow Up Instructions: Take BP  medications and furosemide as prescribed Avoid all OTC decongestants Maintain Adequate oral hydration. IF SBP still >200 in 1hrs, go to ED Call office with BP readings tomorrow   I discussed the assessment and treatment plan with the patient. The patient was provided an opportunity to ask questions and all were answered. The patient agreed with the plan and demonstrated an understanding of the instructions.   The patient was advised to call back or seek an in-person evaluation if the symptoms worsen or if the condition fails to improve as anticipated.   Wilfred Lacy, NP

## 2019-10-21 NOTE — Patient Instructions (Addendum)
Take BP medications and furosemide as prescribed Avoid all OTC decongestants Maintain Adequate oral hydration. IF SBP still >200 in 1hrs, go to ED Call office with BP readings tomorrow

## 2019-10-22 ENCOUNTER — Telehealth: Payer: Self-pay | Admitting: Nurse Practitioner

## 2019-10-22 NOTE — Telephone Encounter (Signed)
Ms. Portee reports improve sinus congestion and headache. She also reports BP of 153/72 today and 166/75 yesterday after medications. Advised to call office if symptoms worsen

## 2019-10-24 ENCOUNTER — Telehealth: Payer: Self-pay

## 2019-10-24 NOTE — Progress Notes (Signed)
Chronic Care Management Pharmacy Assistant   Name: Stacey Page  MRN: [id number] DOB: 01-26-1951  Reason for Encounter: Medication Review  PCP : Flossie Buffy, NP  Allergies:  No Known Allergies  Medications: Outpatient Encounter Medications as of 10/24/2019  Medication Sig  . acetaminophen (TYLENOL) 500 MG tablet Take 1 tablet (500 mg total) by mouth every 6 (six) hours as needed.  Marland Kitchen albuterol (PROAIR HFA) 108 (90 Base) MCG/ACT inhaler Inhale 2 puffs into the lungs every 6 (six) hours as needed for wheezing or shortness of breath.  Marland Kitchen aspirin 81 MG tablet Take 1 tablet (81 mg total) by mouth daily with breakfast.  . atorvastatin (LIPITOR) 20 MG tablet Take 1 tablet (20 mg total) by mouth daily at 6 PM.  . benzonatate (TESSALON) 100 MG capsule Take 1 capsule (100 mg total) by mouth 3 (three) times daily as needed for cough.  . Calcium Carbonate-Vitamin D (CALCIUM 600+D) 600-400 MG-UNIT tablet Take 1 tablet by mouth daily.  . carvedilol (COREG) 25 MG tablet Take 1 tablet (25 mg total) by mouth 2 (two) times daily.  . Cholecalciferol (VITAMIN D3) 1.25 MG (50000 UT) CAPS Take 1 capsule by mouth once a week.  . Clobetasol Prop Emollient Base (CLOBETASOL PROPIONATE E) 0.05 % emollient cream Apply 1 application topically daily as needed (skin irritations).  Marland Kitchen DM-GG & DM-APAP-CPM (CORICIDIN HBP DAY/NIGHT COLD) 10-20 &15-200-2 MG MISC Take 1 tablet by mouth every 6 (six) hours as needed.  . ferrous sulfate (FEROSUL) 325 (65 FE) MG tablet Take 325 mg by mouth daily with breakfast.  . furosemide (LASIX) 40 MG tablet TAKE 1 TAB DAILY, TAKE AN ADDITIONAL TAB AT 2 PM IF INCREASED SWELLING AND/OR SHORTNESS OF BREATH  . hydrALAZINE (APRESOLINE) 25 MG tablet Take 12.5 mg by mouth 3 (three) times daily.  Marland Kitchen lisinopril (ZESTRIL) 20 MG tablet Take 1 tablet (20 mg total) by mouth daily.  . mometasone (NASONEX) 50 MCG/ACT nasal spray Place 2 sprays into the nose daily.  . montelukast (SINGULAIR)  10 MG tablet Take 1 tablet (10 mg total) by mouth at bedtime.  . sitaGLIPtin (JANUVIA) 100 MG tablet Take 1 tablet (100 mg total) by mouth daily.  . vitamin C (ASCORBIC ACID) 500 MG tablet Take 500 mg by mouth daily.    No facility-administered encounter medications on file as of 10/24/2019.    Current Diagnosis: Patient Active Problem List   Diagnosis Date Noted  . Type 2 DM with CKD stage 4 and hypertension ([place]) 07/09/2019  . Diabetes mellitus type 2 in obese ([place]) 07/02/2019  . Polyneuropathy associated with underlying disease ([place]) 11/28/2018  . Severe episode of recurrent major depressive disorder, without psychotic features ([place]) 11/28/2018  . Type 2 diabetes mellitus with diabetic peripheral angiopathy without gangrene, without long-term current use of insulin ([place]) 11/28/2018  . Poor short term memory 11/28/2018  . Mild aortic stenosis by prior echocardiogram 11/06/2018  . CKD (chronic kidney disease) stage 3, GFR 30-59 ml/min 03/17/2016  . Symptomatic anemia 03/17/2016  . S/P total knee arthroplasty, right 03/17/2016  . Unilateral primary osteoarthritis, right knee   . Hyperlipidemia LDL goal <70 12/15/2015  . Pain in both lower extremities 11/19/2015  . Numbness in both hands 08/13/2015  . Panic attack 06/09/2015  . Right knee pain 05/14/2015  . Chronic renal insufficiency 09/16/2014  . Pneumothorax on right   . Atypical mycobacterium infection   . ILD (interstitial lung disease) 2/2 MAI s/p med rxn   . Routine  general medical examination at a health care facility 04/03/2014  . Morbid obesity ([place]) 04/03/2014  . OSA (obstructive sleep apnea) 02/25/2014  . Hypertensive heart disease with chronic diastolic congestive heart failure ([place]) 02/25/2014  . DOE (dyspnea on exertion) 02/25/2014  . Essential hypertension 02/25/2014    Goals Addressed   None     Follow-Up:  Pharmacist Review   Reviewed chart for medication changes ahead of medication coordination  call.  No OVs, Consults, or hospital visits since last care coordination call/Pharmacist visit.     09/19/2019-PCP    10/21/2019-PCP   No medication changes indicated   BP Readings from Last 3 Encounters:  10/21/19 (!) 250/87  10/07/19 125/82  09/19/19 (!) 111/51    Lab Results  Component Value Date   HGBA1C 5.9 (H) 07/04/2019     Patient obtains medications through Adherence Packaging  90 Days   Last adherence delivery included: (medication name and frequency)   None ID Patient declined (meds) last month due to PRN use/additional supply on hand.    APAP (gets OTC) Albuterol inhaler (not currently using)  Aspirin 81 (gets OTC) Atorvastatin (Previous supply from [facility]  30 tablets on hand). Calcium + Vit D (gets OTC) Clobetasol (no longer taking) Ferrous sulfate(gets OTC)             Januvia (gets for free through her work insurance)  Nasonex (gets OTC) Vitamin C (gets OTC)   Patient is due for next adherence delivery on: 11/04/2019. Called patient and reviewed medications and coordinated delivery.   This delivery to include:   Hydralazine 25 Tablet 1/2 Tablet three times Daily,Breakfast ,Lunch,bedtime  Vitamin D3 50000 unit  Capsule -Weekly .  Patient declined the following medications (meds) due to (reason)    APAP (gets OTC) Albuterol inhaler (not currently using)  Aspirin 81 (gets OTC) Atorvastatin (Adqeuate supply). Calcium + Vit D (gets OTC) Clobetasol (no longer taking) Ferrous sulfate(gets OTC)             Januvia (gets for free through her work insurance)  Nasonex (gets OTC) Vitamin C (gets OTC) Carvedilol 25 mg twice daily(adequate supply) Montelukast 10 mg daily(adequate supply)-20 on hand Lisinopril 20 mg daily(adequate  supply)-30 on hand   Furosemide 40 mg (adequate supply)  Patient needs refills for None ID .  [facility] Pharmacist Assistant [phone]    Confirmed delivery date of 11/04/2019, advised patient that pharmacy will contact them the morning of delivery.

## 2019-10-28 ENCOUNTER — Other Ambulatory Visit: Payer: Self-pay

## 2019-10-28 ENCOUNTER — Ambulatory Visit (INDEPENDENT_AMBULATORY_CARE_PROVIDER_SITE_OTHER): Payer: PPO | Admitting: Bariatrics

## 2019-10-28 ENCOUNTER — Encounter (INDEPENDENT_AMBULATORY_CARE_PROVIDER_SITE_OTHER): Payer: Self-pay | Admitting: Bariatrics

## 2019-10-28 VITALS — BP 158/72 | HR 55 | Temp 98.2°F | Ht 66.0 in | Wt 333.0 lb

## 2019-10-28 DIAGNOSIS — Z6841 Body Mass Index (BMI) 40.0 and over, adult: Secondary | ICD-10-CM

## 2019-10-28 DIAGNOSIS — E1169 Type 2 diabetes mellitus with other specified complication: Secondary | ICD-10-CM | POA: Diagnosis not present

## 2019-10-28 DIAGNOSIS — E7849 Other hyperlipidemia: Secondary | ICD-10-CM | POA: Diagnosis not present

## 2019-10-28 DIAGNOSIS — E559 Vitamin D deficiency, unspecified: Secondary | ICD-10-CM | POA: Diagnosis not present

## 2019-10-28 DIAGNOSIS — N184 Chronic kidney disease, stage 4 (severe): Secondary | ICD-10-CM

## 2019-10-28 DIAGNOSIS — I129 Hypertensive chronic kidney disease with stage 1 through stage 4 chronic kidney disease, or unspecified chronic kidney disease: Secondary | ICD-10-CM | POA: Diagnosis not present

## 2019-10-28 DIAGNOSIS — E669 Obesity, unspecified: Secondary | ICD-10-CM

## 2019-10-28 DIAGNOSIS — E1122 Type 2 diabetes mellitus with diabetic chronic kidney disease: Secondary | ICD-10-CM | POA: Diagnosis not present

## 2019-10-28 MED ORDER — VITAMIN D3 1.25 MG (50000 UT) PO CAPS: 1.0000 | ORAL_CAPSULE | ORAL | 0 refills | Status: DC

## 2019-10-29 LAB — COMPREHENSIVE METABOLIC PANEL
ALT: 9 IU/L (ref 0–32)
AST: 16 IU/L (ref 0–40)
Albumin/Globulin Ratio: 1.4 (ref 1.2–2.2)
Albumin: 4.1 g/dL (ref 3.8–4.8)
Alkaline Phosphatase: 73 IU/L (ref 44–121)
BUN/Creatinine Ratio: 14 (ref 12–28)
BUN: 21 mg/dL (ref 8–27)
Bilirubin Total: 0.3 mg/dL (ref 0.0–1.2)
CO2: 27 mmol/L (ref 20–29)
Calcium: 9.9 mg/dL (ref 8.7–10.3)
Chloride: 102 mmol/L (ref 96–106)
Creatinine, Ser: 1.54 mg/dL — ABNORMAL HIGH (ref 0.57–1.00)
GFR calc Af Amer: 40 mL/min/{1.73_m2} — ABNORMAL LOW (ref >59)
GFR calc non Af Amer: 35 mL/min/{1.73_m2} — ABNORMAL LOW (ref >59)
Globulin, Total: 3 g/dL (ref 1.5–4.5)
Glucose: 105 mg/dL — ABNORMAL HIGH (ref 65–99)
Potassium: 4.7 mmol/L (ref 3.5–5.2)
Sodium: 142 mmol/L (ref 134–144)
Total Protein: 7.1 g/dL (ref 6.0–8.5)

## 2019-10-29 LAB — HEMOGLOBIN A1C
Est. average glucose Bld gHb Est-mCnc: 126 mg/dL
Hgb A1c MFr Bld: 6 % — ABNORMAL HIGH (ref 4.8–5.6)

## 2019-10-29 LAB — INSULIN, RANDOM: INSULIN: 8.6 u[IU]/mL (ref 2.6–24.9)

## 2019-10-29 LAB — VITAMIN D 25 HYDROXY (VIT D DEFICIENCY, FRACTURES): Vit D, 25-Hydroxy: 57.6 ng/mL (ref 30.0–100.0)

## 2019-10-30 ENCOUNTER — Encounter (INDEPENDENT_AMBULATORY_CARE_PROVIDER_SITE_OTHER): Payer: Self-pay | Admitting: Bariatrics

## 2019-10-30 NOTE — Progress Notes (Signed)
Chief Complaint:   OBESITY Stacey Page is here to discuss her progress with her obesity treatment plan along with follow-up of her obesity related diagnoses. Stacey Page is on the Category 3 Plan and states she is following her eating plan approximately 50% of the time. Stacey Page states she is walking 15 minutes 2-3 times per week.  Today's visit was #: 4 Starting weight: 344 lbs Starting date: 03/25/2019 Today's weight: 333 lbs Today's date: 10/28/2019 Total lbs lost to date: 11 Total lbs lost since last in-office visit: 3  Interim History: Stacey Page is down an additional 3 lbs.  Subjective:   Vitamin D deficiency. No nausea, vomiting, or muscle weakness.    Ref. Range 07/04/2019 12:40  Vitamin D, 25-Hydroxy Latest Ref Range: 30.0 - 100.0 ng/mL 41.5   Other hyperlipidemia. Stacey Page is taking Lipitor.   Lab Results  Component Value Date   CHOL 128 07/04/2019   HDL 61 07/04/2019   LDLCALC 53 07/04/2019   TRIG 66 07/04/2019   CHOLHDL 3 11/28/2018   CKD (chronic kidney disease), stage IV ([place]). Stacey Page is seeing a nephrologist.  Diabetes mellitus type 2 in obese ([place]). Stacey Page denies polyphagia.   Lab Results  Component Value Date   HGBA1C 5.9 (H) 07/04/2019   HGBA1C 6.0 (H) 03/25/2019   Lab Results  Component Value Date   MICROALBUR 1.9 01/16/2018   LDLCALC 53 07/04/2019   CREATININE 1.54 (H) 10/28/2019   Lab Results  Component Value Date   INSULIN 9.2 07/04/2019   INSULIN 8.8 03/25/2019   Assessment/Plan:   Vitamin D deficiency. Low Vitamin D level contributes to fatigue and are associated with obesity, breast, and colon cancer. She was given a prescription for Cholecalciferol (VITAMIN D3) 1.25 MG (50000 UT) CAPS every week #4 with 0 refills and VITAMIN D 25 Hydroxy (Vit-D Deficiency, Fractures) level will be checked today.   Other hyperlipidemia. Cardiovascular risk and specific lipid/LDL goals reviewed.  We discussed several lifestyle modifications  today and Stacey Page will continue to work on diet, exercise and weight loss efforts. Orders and follow up as documented in patient record. She will continue Lipitor as directed.   Counseling Intensive lifestyle modifications are the first line treatment for this issue. . Dietary changes: Increase soluble fiber. Decrease simple carbohydrates. . Exercise changes: Moderate to vigorous-intensity aerobic activity 150 minutes per week if tolerated. . Lipid-lowering medications: see documented in medical record.  CKD (chronic kidney disease), stage IV ([place]). CMP will be checked today.  Diabetes mellitus type 2 in obese ([place]). Good blood sugar control is important to decrease the likelihood of diabetic complications such as nephropathy, neuropathy, limb loss, blindness, coronary artery disease, and death. Intensive lifestyle modification including diet, exercise and weight loss are the first line of treatment for diabetes. Hemoglobin A1c, Insulin, random labs will be checked today.   Class 3 severe obesity with serious comorbidity and body mass index (BMI) of 50.0 to 59.9 in adult, unspecified obesity type ([place]).  Stacey Page is currently in the action stage of change. As such, her goal is to continue with weight loss efforts. She has agreed to the Category 3 Plan.   She will work on meal planning and intentional eating.   Exercise goals: Older adults should follow the adult guidelines. When older adults cannot meet the adult guidelines, they should be as physically active as their abilities and conditions will allow.   Behavioral modification strategies: increasing lean protein intake, decreasing simple carbohydrates, increasing vegetables, increasing water intake, decreasing eating  out, no skipping meals, meal planning and cooking strategies, keeping healthy foods in the home and planning for success.  Stacey Page has agreed to follow-up with our clinic in 4 weeks. She was informed of the importance of  frequent follow-up visits to maximize her success with intensive lifestyle modifications for her multiple health conditions.   Stacey Page was informed we would discuss her lab results at her next visit unless there is a critical issue that needs to be addressed sooner. Stacey Page agreed to keep her next visit at the agreed upon time to discuss these results.  Objective:   Blood pressure (!) 158/72, pulse (!) 55, temperature 98.2 F (36.8 C), temperature source Oral, height 5\' 6"  (1.676 m), weight (!) 333 lb (151 kg), SpO2 95 %. Body mass index is 53.75 kg/m.  General: Cooperative, alert, well developed, in no acute distress. HEENT: Conjunctivae and lids unremarkable. Cardiovascular: Regular rhythm.  Lungs: Normal work of breathing. Neurologic: No focal deficits.   Lab Results  Component Value Date   CREATININE 1.54 (H) 10/28/2019   BUN 21 10/28/2019   NA 142 10/28/2019   K 4.7 10/28/2019   CL 102 10/28/2019   CO2 27 10/28/2019   Lab Results  Component Value Date   ALT 9 10/28/2019   AST 16 10/28/2019   ALKPHOS 73 10/28/2019   BILITOT 0.3 10/28/2019   Lab Results  Component Value Date   HGBA1C 6.0 (H) 10/28/2019   HGBA1C 5.9 (H) 07/04/2019   HGBA1C 6.0 (H) 03/25/2019   HGBA1C 6.1 11/28/2018   HGBA1C 6.2 01/16/2018   Lab Results  Component Value Date   INSULIN 8.6 10/28/2019   INSULIN 9.2 07/04/2019   INSULIN 8.8 03/25/2019   Lab Results  Component Value Date   TSH 0.51 11/28/2018   Lab Results  Component Value Date   CHOL 128 07/04/2019   HDL 61 07/04/2019   LDLCALC 53 07/04/2019   TRIG 66 07/04/2019   CHOLHDL 3 11/28/2018   Lab Results  Component Value Date   WBC 4.8 12/24/2018   HGB 12.7 12/24/2018   HCT 41.4 12/24/2018   MCV 95.8 12/24/2018   PLT 213 12/24/2018   No results found for: IRON, TIBC, FERRITIN  Obesity Behavioral Intervention:   Approximately 15 minutes were spent on the discussion below.  ASK: We discussed the diagnosis of obesity  with Cadie today and Stacey Page agreed to give [name] permission to discuss obesity behavioral modification therapy today.  ASSESS: Stacey Page has the diagnosis of obesity and her BMI today is 53.7. Stacey Page is in the action stage of change.   ADVISE: Stacey Page was educated on the multiple health risks of obesity as well as the benefit of weight loss to improve her health. She was advised of the need for long term treatment and the importance of lifestyle modifications to improve her current health and to decrease her risk of future health problems.  AGREE: Multiple dietary modification options and treatment options were discussed and Stacey Page agreed to follow the recommendations documented in the above note.  ARRANGE: Stacey Page was educated on the importance of frequent visits to treat obesity as outlined per CMS and USPSTF guidelines and agreed to schedule her next follow up appointment today.  Attestation Statements:   Reviewed by clinician on day of visit: allergies, medications, problem list, medical history, surgical history, family history, social history, and previous encounter notes.  Migdalia Dk, am acting as Location manager for [employer], DO   I have reviewed the above documentation for  accuracy and completeness, and I agree with the above. Jearld Lesch, DO

## 2019-11-04 ENCOUNTER — Ambulatory Visit (HOSPITAL_COMMUNITY): Payer: PPO | Attending: Internal Medicine

## 2019-11-04 ENCOUNTER — Other Ambulatory Visit: Payer: Self-pay

## 2019-11-04 DIAGNOSIS — I350 Nonrheumatic aortic (valve) stenosis: Secondary | ICD-10-CM | POA: Diagnosis not present

## 2019-11-04 DIAGNOSIS — I5032 Chronic diastolic (congestive) heart failure: Secondary | ICD-10-CM | POA: Insufficient documentation

## 2019-11-04 DIAGNOSIS — I110 Hypertensive heart disease with heart failure: Secondary | ICD-10-CM | POA: Diagnosis not present

## 2019-11-04 HISTORY — PX: TRANSTHORACIC ECHOCARDIOGRAM: SHX275

## 2019-11-04 LAB — ECHOCARDIOGRAM COMPLETE
AR max vel: 1.36 cm2
AV Area VTI: 1.32 cm2
AV Area mean vel: 1.25 cm2
AV Mean grad: 14 mmHg
AV Peak grad: 23.2 mmHg
Ao pk vel: 2.41 m/s
Area-P 1/2: 2.69 cm2
S' Lateral: 3.3 cm

## 2019-11-07 ENCOUNTER — Telehealth: Payer: Self-pay

## 2019-11-07 NOTE — Progress Notes (Signed)
Chronic Care Management Pharmacy Assistant   Name: Sharanya Templin  MRN: [id number] DOB: 1950/08/22  Reason for Encounter: Medication Review   PCP : Flossie Buffy, NP  Allergies:  No Known Allergies  Medications: Outpatient Encounter Medications as of 11/07/2019  Medication Sig  . acetaminophen (TYLENOL) 500 MG tablet Take 1 tablet (500 mg total) by mouth every 6 (six) hours as needed.  Marland Kitchen albuterol (PROAIR HFA) 108 (90 Base) MCG/ACT inhaler Inhale 2 puffs into the lungs every 6 (six) hours as needed for wheezing or shortness of breath.  Marland Kitchen aspirin 81 MG tablet Take 1 tablet (81 mg total) by mouth daily with breakfast.  . atorvastatin (LIPITOR) 20 MG tablet Take 1 tablet (20 mg total) by mouth daily at 6 PM.  . benzonatate (TESSALON) 100 MG capsule Take 1 capsule (100 mg total) by mouth 3 (three) times daily as needed for cough.  . Calcium Carbonate-Vitamin D (CALCIUM 600+D) 600-400 MG-UNIT tablet Take 1 tablet by mouth daily.  . carvedilol (COREG) 25 MG tablet Take 1 tablet (25 mg total) by mouth 2 (two) times daily.  . Cholecalciferol (VITAMIN D3) 1.25 MG (50000 UT) CAPS Take 1 capsule by mouth once a week.  . Clobetasol Prop Emollient Base (CLOBETASOL PROPIONATE E) 0.05 % emollient cream Apply 1 application topically daily as needed (skin irritations).  Marland Kitchen DM-GG & DM-APAP-CPM (CORICIDIN HBP DAY/NIGHT COLD) 10-20 &15-200-2 MG MISC Take 1 tablet by mouth every 6 (six) hours as needed.  . ferrous sulfate (FEROSUL) 325 (65 FE) MG tablet Take 325 mg by mouth daily with breakfast.  . furosemide (LASIX) 40 MG tablet TAKE 1 TAB DAILY, TAKE AN ADDITIONAL TAB AT 2 PM IF INCREASED SWELLING AND/OR SHORTNESS OF BREATH  . hydrALAZINE (APRESOLINE) 25 MG tablet Take 12.5 mg by mouth 3 (three) times daily.  Marland Kitchen lisinopril (ZESTRIL) 20 MG tablet Take 1 tablet (20 mg total) by mouth daily.  . mometasone (NASONEX) 50 MCG/ACT nasal spray Place 2 sprays into the nose daily.  . montelukast  (SINGULAIR) 10 MG tablet Take 1 tablet (10 mg total) by mouth at bedtime.  . sitaGLIPtin (JANUVIA) 100 MG tablet Take 1 tablet (100 mg total) by mouth daily.  . vitamin C (ASCORBIC ACID) 500 MG tablet Take 500 mg by mouth daily.    No facility-administered encounter medications on file as of 11/07/2019.    Current Diagnosis: Patient Active Problem List   Diagnosis Date Noted  . Type 2 DM with CKD stage 4 and hypertension ([place]) 07/09/2019  . Diabetes mellitus type 2 in obese ([place]) 07/02/2019  . Polyneuropathy associated with underlying disease ([place]) 11/28/2018  . Severe episode of recurrent major depressive disorder, without psychotic features ([place]) 11/28/2018  . Type 2 diabetes mellitus with diabetic peripheral angiopathy without gangrene, without long-term current use of insulin ([place]) 11/28/2018  . Poor short term memory 11/28/2018  . Mild aortic stenosis by prior echocardiogram 11/06/2018  . CKD (chronic kidney disease) stage 3, GFR 30-59 ml/min 03/17/2016  . Symptomatic anemia 03/17/2016  . S/P total knee arthroplasty, right 03/17/2016  . Unilateral primary osteoarthritis, right knee   . Hyperlipidemia LDL goal <70 12/15/2015  . Pain in both lower extremities 11/19/2015  . Numbness in both hands 08/13/2015  . Panic attack 06/09/2015  . Right knee pain 05/14/2015  . Chronic renal insufficiency 09/16/2014  . Pneumothorax on right   . Atypical mycobacterium infection   . ILD (interstitial lung disease) 2/2 MAI s/p med rxn   .  Routine general medical examination at a health care facility 04/03/2014  . Morbid obesity ([place]) 04/03/2014  . OSA (obstructive sleep apnea) 02/25/2014  . Hypertensive heart disease with chronic diastolic congestive heart failure ([place]) 02/25/2014  . DOE (dyspnea on exertion) 02/25/2014  . Essential hypertension 02/25/2014    Goals Addressed   None     Follow-Up:  Pharmacist Review   Reviewed chart and adherence measures. Per insurance data, patient is  99% adherent to her  lisinopril  for hypertension and 62% adherent to his Atorvastatin Calcium for hyperlipidemia. Patient does not meet goal for Atorvastatin, will notify Brain Hilts, CPP.  [facility] Pharmacist Assistant [phone]

## 2019-11-11 ENCOUNTER — Telehealth: Payer: PPO

## 2019-11-18 ENCOUNTER — Encounter (INDEPENDENT_AMBULATORY_CARE_PROVIDER_SITE_OTHER): Payer: Self-pay | Admitting: Bariatrics

## 2019-11-18 ENCOUNTER — Other Ambulatory Visit: Payer: Self-pay | Admitting: Nurse Practitioner

## 2019-11-18 ENCOUNTER — Ambulatory Visit (INDEPENDENT_AMBULATORY_CARE_PROVIDER_SITE_OTHER): Payer: PPO | Admitting: Bariatrics

## 2019-11-18 ENCOUNTER — Other Ambulatory Visit: Payer: Self-pay

## 2019-11-18 VITALS — BP 138/60 | HR 59 | Temp 97.7°F | Ht 66.0 in | Wt 335.0 lb

## 2019-11-18 DIAGNOSIS — E559 Vitamin D deficiency, unspecified: Secondary | ICD-10-CM | POA: Diagnosis not present

## 2019-11-18 DIAGNOSIS — E1122 Type 2 diabetes mellitus with diabetic chronic kidney disease: Secondary | ICD-10-CM | POA: Diagnosis not present

## 2019-11-18 DIAGNOSIS — N1832 Chronic kidney disease, stage 3b: Secondary | ICD-10-CM | POA: Diagnosis not present

## 2019-11-18 DIAGNOSIS — Z6841 Body Mass Index (BMI) 40.0 and over, adult: Secondary | ICD-10-CM

## 2019-11-18 DIAGNOSIS — I10 Essential (primary) hypertension: Secondary | ICD-10-CM

## 2019-11-18 NOTE — Telephone Encounter (Signed)
Pt requesting refill on Lisinopril Chart supports Rx

## 2019-11-18 NOTE — Progress Notes (Signed)
Chief Complaint:   OBESITY Stacey Page is here to discuss her progress with her obesity treatment plan along with follow-up of her obesity related diagnoses. Kaydra is on the Category 3 Plan and states she is following her eating plan approximately 50% of the time. Mikhayla states she is walking 10 minutes 5 times per week.  Today's visit was #: 14 Starting weight: 344 lbs Starting date: 03/25/2019 Today's weight: 335 lbs Today's date: 11/18/2019 Total lbs lost to date: 9 Total lbs lost since last in-office visit: 0  Interim History: Hiral is up 2 lbs, but has done well overall. She is up about 2.2 lbs of water.  Subjective:   Stage 3b chronic kidney disease ([place]). Arbell's GFR is improved from 28 to 40.  Type 2 diabetes mellitus with stage 3b chronic kidney disease, without long-term current use of insulin ([place]). A1c 6.0, well controlled. Mikailah is taking Januvia.  Lab Results  Component Value Date   HGBA1C 6.0 (H) 10/28/2019   HGBA1C 5.9 (H) 07/04/2019   HGBA1C 6.0 (H) 03/25/2019   Lab Results  Component Value Date   MICROALBUR 1.9 01/16/2018   LDLCALC 53 07/04/2019   CREATININE 1.54 (H) 10/28/2019   Lab Results  Component Value Date   INSULIN 8.6 10/28/2019   INSULIN 9.2 07/04/2019   INSULIN 8.8 03/25/2019   Vitamin D deficiency. No nausea, vomiting, or muscle weakness.    Ref. Range 10/28/2019 09:31  Vitamin D, 25-Hydroxy Latest Ref Range: 30.0 - 100.0 ng/mL 57.6   Assessment/Plan:   Stage 3b chronic kidney disease ([place]). Alishba will remain hydrated and was advised to keep hypertension and diabetes mellitus type II controlled.  Type 2 diabetes mellitus with stage 3b chronic kidney disease, without long-term current use of insulin ([place]). Good blood sugar control is important to decrease the likelihood of diabetic complications such as nephropathy, neuropathy, limb loss, blindness, coronary artery disease, and death. Intensive lifestyle  modification including diet, exercise and weight loss are the first line of treatment for diabetes. Suzzane will continue her medication as directed.   Vitamin D deficiency. Low Vitamin D level contributes to fatigue and are associated with obesity, breast, and colon cancer. She will stop prescription Vitamin D supplementation and will take OTC Vitamin D 2,000 IU daily. She will follow-up for routine testing of Vitamin D, at least 2-3 times per year to avoid over-replacement.  Class 3 severe obesity with serious comorbidity and body mass index (BMI) of 50.0 to 59.9 in adult, unspecified obesity type ([place]).  Tayana is currently in the action stage of change. As such, her goal is to continue with weight loss efforts. She has agreed to the Category 3 Plan.   She will work on meal planning, avoid adding salt to diet, and adhering closely to the plan.  We independently reviewed with the patient labs including CMP, A1c, and Vitamin D.  Exercise goals: Berenice will use her Cubii for exercise.  Behavioral modification strategies: increasing lean protein intake, decreasing simple carbohydrates, increasing vegetables, increasing water intake, decreasing eating out, no skipping meals, meal planning and cooking strategies, keeping healthy foods in the home and planning for success.  Shemeka has agreed to follow-up with our clinic in 3 weeks. She was informed of the importance of frequent follow-up visits to maximize her success with intensive lifestyle modifications for her multiple health conditions.   Objective:   Blood pressure 138/60, pulse (!) 59, temperature 97.7 F (36.5 C), height 5\' 6"  (1.676 m), weight Marland Kitchen)  335 lb (152 kg), SpO2 96 %. Body mass index is 54.07 kg/m.  General: Cooperative, alert, well developed, in no acute distress. HEENT: Conjunctivae and lids unremarkable. Cardiovascular: Regular rhythm.  Lungs: Normal work of breathing. Neurologic: No focal deficits.   Lab  Results  Component Value Date   CREATININE 1.54 (H) 10/28/2019   BUN 21 10/28/2019   NA 142 10/28/2019   K 4.7 10/28/2019   CL 102 10/28/2019   CO2 27 10/28/2019   Lab Results  Component Value Date   ALT 9 10/28/2019   AST 16 10/28/2019   ALKPHOS 73 10/28/2019   BILITOT 0.3 10/28/2019   Lab Results  Component Value Date   HGBA1C 6.0 (H) 10/28/2019   HGBA1C 5.9 (H) 07/04/2019   HGBA1C 6.0 (H) 03/25/2019   HGBA1C 6.1 11/28/2018   HGBA1C 6.2 01/16/2018   Lab Results  Component Value Date   INSULIN 8.6 10/28/2019   INSULIN 9.2 07/04/2019   INSULIN 8.8 03/25/2019   Lab Results  Component Value Date   TSH 0.51 11/28/2018   Lab Results  Component Value Date   CHOL 128 07/04/2019   HDL 61 07/04/2019   LDLCALC 53 07/04/2019   TRIG 66 07/04/2019   CHOLHDL 3 11/28/2018   Lab Results  Component Value Date   WBC 4.8 12/24/2018   HGB 12.7 12/24/2018   HCT 41.4 12/24/2018   MCV 95.8 12/24/2018   PLT 213 12/24/2018   No results found for: IRON, TIBC, FERRITIN  Obesity Behavioral Intervention:   Approximately 15 minutes were spent on the discussion below.  ASK: We discussed the diagnosis of obesity with Augustine today and Jyll agreed to give [name] permission to discuss obesity behavioral modification therapy today.  ASSESS: Zury has the diagnosis of obesity and her BMI today is 54.2. Yoshino is in the action stage of change.   ADVISE: Treana was educated on the multiple health risks of obesity as well as the benefit of weight loss to improve her health. She was advised of the need for long term treatment and the importance of lifestyle modifications to improve her current health and to decrease her risk of future health problems.  AGREE: Multiple dietary modification options and treatment options were discussed and Adalin agreed to follow the recommendations documented in the above note.  ARRANGE: Rella was educated on the importance of frequent  visits to treat obesity as outlined per CMS and USPSTF guidelines and agreed to schedule her next follow up appointment today.  Attestation Statements:   Reviewed by clinician on day of visit: allergies, medications, problem list, medical history, surgical history, family history, social history, and previous encounter notes.  Migdalia Dk, am acting as Location manager for [employer], DO   I have reviewed the above documentation for accuracy and completeness, and I agree with the above. Jearld Lesch, DO

## 2019-11-19 ENCOUNTER — Telehealth: Payer: Self-pay

## 2019-11-19 ENCOUNTER — Encounter (INDEPENDENT_AMBULATORY_CARE_PROVIDER_SITE_OTHER): Payer: Self-pay | Admitting: Bariatrics

## 2019-11-19 NOTE — Progress Notes (Signed)
Left a message to  confirmed patient telephone appointment on 11/20/2019 for CCM at 1:00PM with Junius Argyle the Clinical pharmacist.   [facility] Pharmacist Assistant [phone]

## 2019-11-20 ENCOUNTER — Telehealth: Payer: PPO

## 2019-11-20 NOTE — Chronic Care Management (AMB) (Deleted)
Chronic Care Management Pharmacy  Name: Dilynn Munroe  MRN: [id number] DOB: September 08, 1950  Chief Complaint/ HPI  Stacey Page,  69 y.o. , female presents for their Initial CCM visit with the clinical pharmacist via telephone due to COVID-19 Pandemic.  PCP : Flossie Buffy, NP  Their chronic conditions include: hypertension, diastolic heart failure, type 2 diabetes, chronic kidney disease, depression  Office Visits:  10/21/19: Video visit with Wilfred Lacy for nasopharyngitis. Patient started on benzonatate and Coricidin. BP in clinic 250/87.  03/01/19: Patient presented to Dr. Bryan Lemma for diabetic foot exam. No medication changes made.  02/28/19: Video Visit with Wilfred Lacy for type 2 diabetes. No medication changes noted.   Consult Visits: 11/18/19: Patient presented to Dr. Owens Shark [facility]) for obesity follow-up. Ergocalciferol stopped, vitamin D 2000 units daily started.  10/28/19: Patient presented to Dr. Owens Shark [facility]) for obesity follow-up. 10/07/19: Patient presented to Dr. Owens Shark [facility]) for obesity follow-up.  Medications: Outpatient Encounter Medications as of 11/20/2019  Medication Sig  . acetaminophen (TYLENOL) 500 MG tablet Take 1 tablet (500 mg total) by mouth every 6 (six) hours as needed.  Marland Kitchen albuterol (PROAIR HFA) 108 (90 Base) MCG/ACT inhaler Inhale 2 puffs into the lungs every 6 (six) hours as needed for wheezing or shortness of breath.  Marland Kitchen aspirin 81 MG tablet Take 1 tablet (81 mg total) by mouth daily with breakfast.  . atorvastatin (LIPITOR) 20 MG tablet Take 1 tablet (20 mg total) by mouth daily at 6 PM.  . benzonatate (TESSALON) 100 MG capsule Take 1 capsule (100 mg total) by mouth 3 (three) times daily as needed for cough.  . Calcium Carbonate-Vitamin D (CALCIUM 600+D) 600-400 MG-UNIT tablet Take 1 tablet by mouth daily.  . carvedilol (COREG) 25 MG tablet Take 1 tablet (25 mg total) by mouth 2 (two) times daily.  . Cholecalciferol  (VITAMIN D3) 1.25 MG (50000 UT) CAPS Take 1 capsule by mouth once a week.  . Clobetasol Prop Emollient Base (CLOBETASOL PROPIONATE E) 0.05 % emollient cream Apply 1 application topically daily as needed (skin irritations).  Marland Kitchen DM-GG & DM-APAP-CPM (CORICIDIN HBP DAY/NIGHT COLD) 10-20 &15-200-2 MG MISC Take 1 tablet by mouth every 6 (six) hours as needed.  . ferrous sulfate (FEROSUL) 325 (65 FE) MG tablet Take 325 mg by mouth daily with breakfast.  . furosemide (LASIX) 40 MG tablet TAKE 1 TAB DAILY, TAKE AN ADDITIONAL TAB AT 2 PM IF INCREASED SWELLING AND/OR SHORTNESS OF BREATH  . hydrALAZINE (APRESOLINE) 25 MG tablet Take 12.5 mg by mouth 3 (three) times daily.  Marland Kitchen lisinopril (ZESTRIL) 20 MG tablet TAKE ONE TABLET BY MOUTH ONCE DAILY WITH BREAKFAST  . mometasone (NASONEX) 50 MCG/ACT nasal spray Place 2 sprays into the nose daily.  . montelukast (SINGULAIR) 10 MG tablet Take 1 tablet (10 mg total) by mouth at bedtime.  . sitaGLIPtin (JANUVIA) 100 MG tablet Take 1 tablet (100 mg total) by mouth daily.  . vitamin C (ASCORBIC ACID) 500 MG tablet Take 500 mg by mouth daily.    No facility-administered encounter medications on file as of 11/20/2019.     Current Diagnosis/Assessment:  Goals Addressed   None     Diabetes   Recent Relevant Labs: Lab Results  Component Value Date/Time   HGBA1C 6.0 (H) 10/28/2019 09:31 AM   HGBA1C 5.9 (H) 07/04/2019 12:40 PM   MICROALBUR 1.9 01/16/2018 11:17 AM     Checking BG: Never Does not have glucose monitor   Recent FBG Readings: n/a Recent pre-meal  BG readings: n/a Recent 2hr PP BG readings:  n/a Recent HS BG readings: n/a  Patient has failed these meds in past: n/a Patient is currently controlled on the following medications:   Januvia 100 mg daily (10 years)- work  A1c goal <7%   Last diabetic Foot exam:  Lab Results  Component Value Date/Time   HMDIABEYEEXA No Retinopathy 11/01/2018 12:00 AM    Last diabetic Eye exam:  Lab Results    Component Value Date/Time   HMDIABFOOTEX normal 09/10/2018 12:00 AM     We discussed: diet and exercise extensively. Eating mainly baked chicken or shrimp with vegetables and fruit. Wants to start going to the gym as she is not able to walk much. Plans to start going to the gym in ~2 weeks.   Plan  Continue current medications,  Heart Failure   Type: Diastolic  Last ejection fraction: 60-65% (Feb 2019) NYHA Class: II (slight limitation of activity) AHA HF Stage: B (Heart disease present - no symptoms present)  Patient has failed these meds in past: labetalol, metoprolol  Patient is currently controlled on the following medications:   Coreg 25 mg twice daily   Furosemide 40 daily, additional 40 mg PRN for swelling   Lisinopril 20 mg daily    We discussed diet and exercise extensively. Trying to watch salt intake.  Plan  Continue current medications   Hypertension   Office blood pressures are  BP Readings from Last 3 Encounters:  11/18/19 138/60  10/28/19 (!) 158/72  10/21/19 (!) 250/87    Patient has failed these meds in the past: n/a Patient is currently controlled on the following medications:   Carvedilol 25 mg twice daily   Furosemide 40 daily, additional 40 mg PRN for swelling   Lisinopril 20 mg daily   BP goal <140/90 Patient checks BP at home never. Does not have blood pressure monitor   Patient home BP readings are ranging: n/a  We discussed diet and exercise extensively  Plan  Continue current medications     Hyperlipidemia   Lipid Panel     Component Value Date/Time   CHOL 128 07/04/2019 1240   TRIG 66 07/04/2019 1240   HDL 61 07/04/2019 1240   CHOLHDL 3 11/28/2018 1042   VLDL 15.6 11/28/2018 1042   LDLCALC 53 07/04/2019 1240   LABVLDL 14 07/04/2019 1240     The ASCVD Risk score (Goff DC Jr., et al., 2013) failed to calculate for the following reasons:   The valid total cholesterol range is 130 to 320 mg/dL   Patient has failed these  meds in past: n/a Patient is currently controlled on the following medications:   Atorvastatin 20 mg daily  Aspirin 81 mg daily  LDL goal <70  We discussed:  diet and exercise extensively. Tolerating aspirin and atorvastatin well. Reported a little bleeding after a minor tooth procedure but otherwise denies unusual bruising/bleeding.  Plan  Continue current medications  Misc/OTC   Acetaminophen 500 mg q6hr PRN -severe headache, hasn't needed  ProAir HFA 152mcg/act 2 puff q6hr PRN -uses daughter's inhaler (can't afford) -seasonal asthma symptoms Calcium carbonate + Vit D 600-400 daily  Vitamin D 1000 units daily  Clobatesol 0.05% cream Ferrous sulfate 325 mg daily  Mometasone 50 mcg/act 2 spray daily  Montelukast 10 mg QHS  Vitamin C 500 units daily  Vitamin D2 50,000 units weekly  Plan  Recommended patient discontinue OTC vitamin D while taking ergocalciferol to minimize pill burden.   Vaccines  Reviewed and discussed patient's vaccination history.    Immunization History  Administered Date(s) Administered  . Fluad Quad(high Dose 65+) 11/28/2018  . Influenza Split 11/21/2016  . Influenza,inj,Quad PF,6+ Mos 01/12/2015, 11/19/2015  . PFIZER SARS-COV-2 Vaccination 03/31/2019, 04/23/2019  . Pneumococcal Conjugate-13 01/19/2017  . Pneumococcal Polysaccharide-23 01/12/2015    Plan  Recommended patient receive Shingrix  Medication Management   Reviewed chart for medication changes ahead of medication coordination call.  No OVs, Consults, or hospital visits since last care coordination call/Pharmacist visit. (If appropriate, list visit date, provider name)  No medication changes indicated OR if recent visit, treatment plan here.  BP Readings from Last 3 Encounters:  11/18/19 138/60  10/28/19 (!) 158/72  10/21/19 (!) 250/87    Lab Results  Component Value Date   HGBA1C 6.0 (H) 10/28/2019     Patient obtains medications through {Packaging choices:23805}  {Day  Supply:23806}   Last adherence delivery included: (medication name and frequency)  Hydralazine 25 Tablet 1/2 Tablet three times Daily,Breakfast ,Lunch,bedtime             Vitamin D3 50000 unit  Capsule -Weekly Patient declined the following meds last month:  APAP (gets OTC) Albuterol inhaler (not currently using)  Aspirin 81 (gets OTC) Atorvastatin (Adqeuate supply). Calcium + Vit D (gets OTC) Clobetasol (no longer taking) Ferrous sulfate(gets OTC) Januvia (gets for free through her work insurance)  Nasonex (gets OTC) Vitamin C (gets OTC) Carvedilol 25 mg twice daily(adequate supply) Montelukast 10 mg daily(adequate supply)-20 on hand Lisinopril 20 mg daily(adequate supply)-30 on hand             Furosemide 40 mg (adequate supply)  Patient is due for next adherence delivery on: ***. Called patient and reviewed medications and coordinated delivery.  This delivery to include: Patient will need a short fill of (med), prior to adherence delivery. (To align with sync date or if PRN med)  Coordinated acute fill for (med) to be delivered (date).  Patient declined the following medications (meds) due to (reason)  Patient needs refills for ***.  Confirmed delivery date of ***, advised patient that pharmacy will contact them the morning of delivery.     Follow up: 6 months   [facility]  [phone]

## 2019-11-27 ENCOUNTER — Ambulatory Visit: Payer: PPO | Admitting: Cardiology

## 2019-11-28 ENCOUNTER — Ambulatory Visit: Payer: PPO | Admitting: Cardiology

## 2019-11-28 ENCOUNTER — Encounter: Payer: Self-pay | Admitting: Cardiology

## 2019-11-28 ENCOUNTER — Other Ambulatory Visit: Payer: Self-pay

## 2019-11-28 VITALS — BP 126/62 | HR 65 | Ht 66.0 in | Wt 338.0 lb

## 2019-11-28 DIAGNOSIS — I5032 Chronic diastolic (congestive) heart failure: Secondary | ICD-10-CM

## 2019-11-28 DIAGNOSIS — R0609 Other forms of dyspnea: Secondary | ICD-10-CM

## 2019-11-28 DIAGNOSIS — R0600 Dyspnea, unspecified: Secondary | ICD-10-CM | POA: Diagnosis not present

## 2019-11-28 DIAGNOSIS — I10 Essential (primary) hypertension: Secondary | ICD-10-CM

## 2019-11-28 DIAGNOSIS — I350 Nonrheumatic aortic (valve) stenosis: Secondary | ICD-10-CM | POA: Diagnosis not present

## 2019-11-28 DIAGNOSIS — E785 Hyperlipidemia, unspecified: Secondary | ICD-10-CM

## 2019-11-28 DIAGNOSIS — I110 Hypertensive heart disease with heart failure: Secondary | ICD-10-CM | POA: Diagnosis not present

## 2019-11-28 NOTE — Patient Instructions (Signed)
Medication Instructions:  Your physician recommends that you continue on your current medications as directed. Please refer to the Current Medication list given to you today.  *If you need a refill on your cardiac medications before your next appointment, please call your pharmacy*   Lab Work: none If you have labs (blood work) drawn today and your tests are completely normal, you will receive your results only by: Marland Kitchen MyChart Message (if you have MyChart) OR . A paper copy in the mail If you have any lab test that is abnormal or we need to change your treatment, we will call you to review the results.   Testing/Procedures: none   Follow-Up: At [facility], you and your health needs are our priority.  As part of our continuing mission to provide you with exceptional heart care, we have created designated Provider Care Teams.  These Care Teams include your primary Cardiologist (physician) and Advanced Practice Providers (APPs -  Physician Assistants and Nurse Practitioners) who all work together to provide you with the care you need, when you need it.  We recommend signing up for the patient portal called "MyChart".  Sign up information is provided on this After Visit Summary.  MyChart is used to connect with patients for Virtual Visits (Telemedicine).  Patients are able to view lab/test results, encounter notes, upcoming appointments, etc.  Non-urgent messages can be sent to your provider as well.   To learn more about what you can do with MyChart, go to [email or web address].    Your next appointment:   12 month(s)  The format for your next appointment:   In Person  Provider:   Glenetta Hew, MD   Other Instructions

## 2019-11-28 NOTE — Progress Notes (Signed)
Primary Care Provider: Flossie Buffy, NP Cardiologist: Glenetta Hew, MD Electrophysiologist: None  Pulmonologist: Chesley Mires, MD  Clinic Note: Chief Complaint  Patient presents with  . Follow-up    Doing well  . Aortic Stenosis    Echo follow-up    HPI:    Stacey Page is a Morbidly Obese 69 y.o. female with a PMH notable for HFpEF (Hypertensive Heart Disease) along with OSA-CPAP, HLD and DM-2 who presents today for annual follow-up.  Problem List Items Addressed This Visit    Hypertensive heart disease with chronic diastolic congestive heart failure (HCC) (Chronic)    Stable blood pressure.  Seems euvolemic on exam with nonpitting edema.  Exertional dyspnea probably related to body habitus more so than HFpEF.  3 days a week Lasix with standing dose of carvedilol lisinopril plus hydralazine (trivial dose)      Essential hypertension (Chronic)    Blood pressure within goal on current dose of carvedilol and lisinopril plus hydralazine.  Using Lasix 3 times a week.      Morbid obesity ([place]) (Chronic)    Weight has been up and down.   According to our scales, she is 14 pounds down from last year at this time.  Hopefully she will continue to keep it off.  I congratulated her effort, and have encouraged her to continue her efforts.        Hyperlipidemia LDL goal <70 (Chronic)    Well with a goal.  On stable dose of atorvastatin.  Tolerating well. . No change      Mild aortic stenosis by prior echocardiogram - Primary (Chronic)    Stable findings of mild stenosis.  Okay to follow-up in 1 3 years.      Relevant Orders   EKG 12-Lead (Completed)   DOE (dyspnea on exertion)    Multifactorial.  Probably large part related to obesity, deconditioning some component of OHS.        Stacey Page was last seen on November 06, 2018 for follow-up visit noting labile weeks.  She had been doing very well with diet and exercise and then fell off the bandwagon only  to gain back weight.  Was in the process of trying to join the gym, but both COVID-19 get that today.  Still noticing some puffy ankle swelling but no PND orthopnea.  Intermittent chest discomfort episodes across her chest that are short stabbing sensations-routine. ->  Was recovering from the surgery.  Increase Coreg to 25 mg twice daily and continued Lasix.  Echo ordered.  Recent Hospitalizations:   12/28/2018 -> Right Breast Lumpectomy with radioactive seed localization.  (Intraductal papilloma)  Reviewed  CV studies:    The following studies were reviewed today: (if available, images/films reviewed: From Epic Chart or Care Everywhere) . TTE 11/04/19: EF 60-65%.  Moderate LVH.  GRII DD.  Mild LV dilation.  Mild LA dilation.  Thickened-calcified aortic valve-Mild AS (mean gradient 13 mmHg, peak 29 mmHg)   Interval History:   Stacey Page returns here today for routine follow-up really doing okay.  She is very much enjoying her CPAP.  She is frustrated because she was doing well with her exercise and this is usually case she will do well for a little while and then slacked off and gained weight back.  Therefore her weight is up and down. She still has exertional dyspnea, but nothing new.  She still has a strange sharp pinprick-like discomfort symptoms in her chest that come and go with or  without exertion.  They do not last more than a few seconds.  No real heart failure arrhythmia symptoms.  CV Review of Symptoms (Summary): positive for - chest pain and Mostly atypical.  Also has baseline exertional dyspnea but no change.  Trivial edema-nonpitting negative for - irregular heartbeat, orthopnea, palpitations, paroxysmal nocturnal dyspnea, rapid heart rate, shortness of breath or Syncope/near syncope, TIA/amaurosis fugax, claudication  The patient does not have symptoms concerning for COVID-19 infection (fever, chills, cough, or new shortness of breath).   REVIEWED OF SYSTEMS   Review  of Systems  Constitutional: Positive for weight loss (With globin now, but is currently down 14 pounds from last visit.). Negative for malaise/fatigue.  HENT: Negative for congestion and nosebleeds.   Respiratory: Positive for shortness of breath (Stable exertional).        Doing well with CPAP.  Sleeps well.  Cardiovascular: Positive for leg swelling.  Gastrointestinal: Negative for blood in stool and melena.  Genitourinary: Negative for hematuria.  Musculoskeletal: Positive for joint pain.  Neurological: Positive for dizziness and tingling (Pedal neuropathy). Negative for headaches.  Psychiatric/Behavioral: Negative for memory loss. The patient is not nervous/anxious and does not have insomnia.     Labile weight, joint pain. Doing well on CPAP.  I have reviewed and (if needed) personally updated the patient's problem list, medications, allergies, past medical and surgical history, social and family history.   PAST MEDICAL HISTORY   Past Medical History:  Diagnosis Date  . Anxiety    doesn't take any meds  . Arthritis of right knee 03/14/2016  . Asthma   . Back pain   . Chronic diastolic CHF (congestive heart failure) (HCC)    HF with Preserved EF (60-65%) - Grade II Diastolic Dysfunction (Hypertensive Heart Disease). takes Furosemide daily  . COPD (chronic obstructive pulmonary disease) (HCC)    Albuterol as needed  . Depression    doesn't take meds  . Diabetes ([place])    takes Januvia daily  . Eczema    uses cream as needed  . GERD (gastroesophageal reflux disease)   . History of bronchitis as a child   . HTN (hypertension)   . Insomnia   . Mild aortic stenosis by prior echocardiogram 04/2017   Mild stenosis: Mean gradient 15 mmHg, peak gradient 28 mmHg  . OA (osteoarthritis)   . Obese   . OSA (obstructive sleep apnea) 02/25/2014   wears CPAP at night  . Peripheral neuropathy    takes Gabapentin as needed  . Pneumonia    hx of-2010  . Seasonal allergies    uses  Flonase daily  . Sleep apnea   . SOB (shortness of breath)     PAST SURGICAL HISTORY   Past Surgical History:  Procedure Laterality Date  . BREAST LUMPECTOMY WITH RADIOACTIVE SEED LOCALIZATION Right 12/28/2018   Procedure: RIGHT BREAST LUMPECTOMY WITH RADIOACTIVE SEED LOCALIZATION;  Surgeon: Jovita Kussmaul, MD;  Location: [facility];  Service: General;  Laterality: Right;  . BREAST SURGERY    . CESAREAN SECTION  x2  . COLONOSCOPY    . CORONARY CALCIUM SCORE & CT ANGIOGRAM  09/2017   Coronary Ca Score = 11 (low).  CTA- no obstructive CAD (minimal disease)  . JOINT REPLACEMENT    . KNEE ARTHROSCOPY Right   . LUNG BIOPSY Right 06/10/2014   Procedure: LUNG BIOPSY;  Surgeon: Ivin Poot, MD;  Location: [facility];  Service: Thoracic;  Laterality: Right;  . POLYPECTOMY     throat  .  TOTAL KNEE ARTHROPLASTY Right 03/14/2016   Procedure: RIGHT TOTAL KNEE ARTHROPLASTY;  Surgeon: Marybelle Killings, MD;  Location: [facility];  Service: Orthopedics;  Laterality: Right;  . TRANSTHORACIC ECHOCARDIOGRAM  11/04/2019   EF 60-65%.  Moderate LVH.  GRII DD.  Mild LV dilation.  Mild LA dilation.  Thickened-calcified aortic valve-Mild AS (mean gradient 13 mmHg, peak 29 mmHg--similar to 2019)  . VIDEO ASSISTED THORACOSCOPY Right 06/10/2014   Procedure: VIDEO ASSISTED THORACOSCOPY;  Surgeon: Ivin Poot, MD;  Location: [facility];  Service: Thoracic;  Laterality: Right;    Immunization History  Administered Date(s) Administered  . Fluad Quad(high Dose 65+) 11/28/2018  . Influenza Split 11/21/2016  . Influenza,inj,Quad PF,6+ Mos 01/12/2015, 11/19/2015  . PFIZER SARS-COV-2 Vaccination 03/31/2019, 04/23/2019  . Pneumococcal Conjugate-13 01/19/2017  . Pneumococcal Polysaccharide-23 01/12/2015    MEDICATIONS/ALLERGIES   Current Meds  Medication Sig  . acetaminophen (TYLENOL) 500 MG tablet Take 1 tablet (500 mg total) by mouth every 6 (six) hours as needed.  Marland Kitchen albuterol (PROAIR HFA) 108 (90 Base) MCG/ACT inhaler Inhale 2  puffs into the lungs every 6 (six) hours as needed for wheezing or shortness of breath.  Marland Kitchen aspirin 81 MG tablet Take 1 tablet (81 mg total) by mouth daily with breakfast.  . atorvastatin (LIPITOR) 20 MG tablet Take 1 tablet (20 mg total) by mouth daily at 6 PM.  . benzonatate (TESSALON) 100 MG capsule Take 1 capsule (100 mg total) by mouth 3 (three) times daily as needed for cough.  . Calcium Carbonate-Vitamin D (CALCIUM 600+D) 600-400 MG-UNIT tablet Take 1 tablet by mouth daily.  . carvedilol (COREG) 25 MG tablet Take 1 tablet (25 mg total) by mouth 2 (two) times daily.  . Cholecalciferol (VITAMIN D3) 1.25 MG (50000 UT) CAPS Take 1 capsule by mouth once a week.  . Clobetasol Prop Emollient Base (CLOBETASOL PROPIONATE E) 0.05 % emollient cream Apply 1 application topically daily as needed (skin irritations).  Marland Kitchen DM-GG & DM-APAP-CPM (CORICIDIN HBP DAY/NIGHT COLD) 10-20 &15-200-2 MG MISC Take 1 tablet by mouth every 6 (six) hours as needed.  . ferrous sulfate (FEROSUL) 325 (65 FE) MG tablet Take 325 mg by mouth daily with breakfast.  . furosemide (LASIX) 40 MG tablet TAKE 1 TAB DAILY, TAKE AN ADDITIONAL TAB AT 2 PM IF INCREASED SWELLING AND/OR SHORTNESS OF BREATH  . hydrALAZINE (APRESOLINE) 25 MG tablet Take 12.5 mg by mouth 3 (three) times daily.  Marland Kitchen lisinopril (ZESTRIL) 20 MG tablet TAKE ONE TABLET BY MOUTH ONCE DAILY WITH BREAKFAST  . mometasone (NASONEX) 50 MCG/ACT nasal spray Place 2 sprays into the nose daily.  . montelukast (SINGULAIR) 10 MG tablet Take 1 tablet (10 mg total) by mouth at bedtime.  . sitaGLIPtin (JANUVIA) 100 MG tablet Take 1 tablet (100 mg total) by mouth daily.  . vitamin C (ASCORBIC ACID) 500 MG tablet Take 500 mg by mouth daily.     No Known Allergies  SOCIAL HISTORY/FAMILY HISTORY   Reviewed in Epic:  Pertinent findings: She is now trying to get into doing exercise but not routinely.  When she is able to exercise and watch her diet, she lose weight.  However she will  then get out of the habit and gained back the weight.  OBJCTIVE -PE, EKG, labs   Wt Readings from Last 3 Encounters:  11/28/19 (!) 338 lb (153.3 kg)  11/18/19 (!) 335 lb (152 kg)  10/28/19 (!) 333 lb (151 kg)  -> 11/06/2018: 352 LB  Physical Exam:  BP 126/62   Pulse 65   Ht 5\' 6"  (1.676 m)   Wt (!) 338 lb (153.3 kg)   BMI 54.55 kg/m  Physical Exam Vitals reviewed.  Constitutional:      General: She is not in acute distress.    Appearance: Normal appearance. She is not ill-appearing or toxic-appearing.     Comments: Morbidly obese.  Well-groomed.  HENT:     Head: Normocephalic and atraumatic.  Neck:     Vascular: No carotid bruit, hepatojugular reflux or JVD.  Cardiovascular:     Rate and Rhythm: Normal rate and regular rhythm.     Pulses: Intact distal pulses. Decreased pulses (Difficult to palpate because of body habitus).     Heart sounds: S1 normal and S2 normal. Heart sounds are distant. Murmur (2/6 C-D SEM @RUSB ) heard.  No friction rub. No gallop.   Pulmonary:     Effort: Pulmonary effort is normal. No respiratory distress.     Comments: Distant breath sounds, mild accessory muscle use but nonlabored. Chest:     Chest wall: Tenderness (She does have some focal tender spots.) present.  Musculoskeletal:        General: Swelling (Maybe 1+ but nonpitting/puffy) present. Normal range of motion.     Cervical back: Normal range of motion and neck supple.  Neurological:     General: No focal deficit present.     Mental Status: She is alert and oriented to person, place, and time.  Psychiatric:        Mood and Affect: Mood normal.        Behavior: Behavior normal.        Thought Content: Thought content normal.        Judgment: Judgment normal.     Adult ECG Report  Rate: 65 ;  Rhythm: normal sinus rhythm and LAFB, LVH-repolarization changes.  Cannot rule out septal MI, age indeterminate;   Narrative Interpretation: Stable  Recent Labs:    Lab Results  Component  Value Date   CHOL 128 07/04/2019   HDL 61 07/04/2019   LDLCALC 53 07/04/2019   TRIG 66 07/04/2019   CHOLHDL 3 11/28/2018   Lab Results  Component Value Date   CREATININE 1.54 (H) 10/28/2019   BUN 21 10/28/2019   NA 142 10/28/2019   K 4.7 10/28/2019   CL 102 10/28/2019   CO2 27 10/28/2019   Lab Results  Component Value Date   TSH 0.51 11/28/2018    ASSESSMENT/PLAN    Problem List Items Addressed This Visit    Hypertensive heart disease with chronic diastolic congestive heart failure (HCC) (Chronic)    Stable blood pressure.  Seems euvolemic on exam with nonpitting edema.  Exertional dyspnea probably related to body habitus more so than HFpEF.  3 days a week Lasix with standing dose of carvedilol lisinopril plus hydralazine (trivial dose)      Essential hypertension (Chronic)    Blood pressure within goal on current dose of carvedilol and lisinopril plus hydralazine.  Using Lasix 3 times a week.      Morbid obesity ([place]) (Chronic)    Weight has been up and down.   According to our scales, she is 14 pounds down from last year at this time.  Hopefully she will continue to keep it off.  I congratulated her effort, and have encouraged her to continue her efforts.        Hyperlipidemia LDL goal <70 (Chronic)    Well with a goal.  On  stable dose of atorvastatin.  Tolerating well. . No change      Mild aortic stenosis by prior echocardiogram - Primary (Chronic)    Stable findings of mild stenosis.  Okay to follow-up in 1 3 years.      Relevant Orders   EKG 12-Lead (Completed)   DOE (dyspnea on exertion)    Multifactorial.  Probably large part related to obesity, deconditioning some component of OHS.          COVID-19 Education: The signs and symptoms of COVID-19 were discussed with the patient and how to seek care for testing (follow up with PCP or arrange E-visit).   The importance of social distancing and COVID-19 vaccination was discussed today.  The patient  is practicing social distancing & Masking.   I spent a total of 81minutes with the patient spent in direct patient consultation.  Additional time spent with chart review  / charting (studies, outside notes, etc): 6 Total Time: 23 min   Current medicines are reviewed at length with the patient today.  (+/- concerns) n/a  This visit occurred during the SARS-CoV-2 public health emergency.  Safety protocols were in place, including screening questions prior to the visit, additional usage of staff PPE, and extensive cleaning of exam room while observing appropriate contact time as indicated for disinfecting solutions.  Notice: This dictation was prepared with Dragon dictation along with smaller phrase technology. Any transcriptional errors that result from this process are unintentional and may not be corrected upon review.  Patient Instructions / Medication Changes & Studies & Tests Ordered   Patient Instructions  Medication Instructions:  Your physician recommends that you continue on your current medications as directed. Please refer to the Current Medication list given to you today.  *If you need a refill on your cardiac medications before your next appointment, please call your pharmacy*   Lab Work: none If you have labs (blood work) drawn today and your tests are completely normal, you will receive your results only by: Marland Kitchen MyChart Message (if you have MyChart) OR . A paper copy in the mail If you have any lab test that is abnormal or we need to change your treatment, we will call you to review the results.   Testing/Procedures: none   Follow-Up: At [facility], you and your health needs are our priority.  As part of our continuing mission to provide you with exceptional heart care, we have created designated Provider Care Teams.  These Care Teams include your primary Cardiologist (physician) and Advanced Practice Providers (APPs -  Physician Assistants and Nurse Practitioners)  who all work together to provide you with the care you need, when you need it.  We recommend signing up for the patient portal called "MyChart".  Sign up information is provided on this After Visit Summary.  MyChart is used to connect with patients for Virtual Visits (Telemedicine).  Patients are able to view lab/test results, encounter notes, upcoming appointments, etc.  Non-urgent messages can be sent to your provider as well.   To learn more about what you can do with MyChart, go to [email or web address].    Your next appointment:   12 month(s)  The format for your next appointment:   In Person  Provider:   Glenetta Hew, MD   Other Instructions      Studies Ordered:   Orders Placed This Encounter  Procedures  . EKG 12-Lead     Glenetta Hew, M.D., M.S. Interventional Cardiologist   Pager # [phone]  Phone # [phone] [address] [place], [place] [address]   Thank you for choosing [facility] at [facility]!!

## 2019-12-02 DIAGNOSIS — N1832 Chronic kidney disease, stage 3b: Secondary | ICD-10-CM | POA: Diagnosis not present

## 2019-12-04 ENCOUNTER — Encounter: Payer: Self-pay | Admitting: Cardiology

## 2019-12-04 DIAGNOSIS — N1832 Chronic kidney disease, stage 3b: Secondary | ICD-10-CM | POA: Diagnosis not present

## 2019-12-04 NOTE — Assessment & Plan Note (Signed)
Weight has been up and down.   According to our scales, she is 14 pounds down from last year at this time.  Hopefully she will continue to keep it off.  I congratulated her effort, and have encouraged her to continue her efforts.

## 2019-12-04 NOTE — Assessment & Plan Note (Signed)
Stable blood pressure.  Seems euvolemic on exam with nonpitting edema.  Exertional dyspnea probably related to body habitus more so than HFpEF.  3 days a week Lasix with standing dose of carvedilol lisinopril plus hydralazine (trivial dose)

## 2019-12-04 NOTE — Assessment & Plan Note (Signed)
Multifactorial.  Probably large part related to obesity, deconditioning some component of OHS.

## 2019-12-04 NOTE — Assessment & Plan Note (Signed)
Stable findings of mild stenosis.  Okay to follow-up in 1 3 years.

## 2019-12-04 NOTE — Assessment & Plan Note (Signed)
Blood pressure within goal on current dose of carvedilol and lisinopril plus hydralazine.  Using Lasix 3 times a week.

## 2019-12-04 NOTE — Assessment & Plan Note (Signed)
Well with a goal.  On stable dose of atorvastatin.  Tolerating well. . No change

## 2019-12-09 ENCOUNTER — Other Ambulatory Visit: Payer: Self-pay

## 2019-12-09 ENCOUNTER — Encounter (INDEPENDENT_AMBULATORY_CARE_PROVIDER_SITE_OTHER): Payer: Self-pay | Admitting: Bariatrics

## 2019-12-09 ENCOUNTER — Ambulatory Visit (INDEPENDENT_AMBULATORY_CARE_PROVIDER_SITE_OTHER): Payer: PPO | Admitting: Bariatrics

## 2019-12-09 VITALS — BP 120/74 | Temp 97.8°F | Ht 66.0 in | Wt 335.0 lb

## 2019-12-09 DIAGNOSIS — E1169 Type 2 diabetes mellitus with other specified complication: Secondary | ICD-10-CM | POA: Diagnosis not present

## 2019-12-09 DIAGNOSIS — E7849 Other hyperlipidemia: Secondary | ICD-10-CM

## 2019-12-09 DIAGNOSIS — Z6841 Body Mass Index (BMI) 40.0 and over, adult: Secondary | ICD-10-CM | POA: Diagnosis not present

## 2019-12-09 NOTE — Progress Notes (Signed)
Chief Complaint:   OBESITY Stacey Page is here to discuss her progress with her obesity treatment plan along with follow-up of her obesity related diagnoses. [name] is on the Category 3 Plan and states she is following her eating plan approximately 0% of the time. [name] states she is walking 5 minutes 5 times per week.  Today's visit was #: 15 Starting weight: 344 lbs Starting date: 03/25/2019 Today's weight: 335 lbs Today's date: 12/09/2019 Total lbs lost to date: 9 Total lbs lost since last in-office visit: 0  Interim History: Stacey Page's weight remains the same since her last visit.  Subjective:   Other hyperlipidemia. [name] is taking Lipitor.   Lab Results  Component Value Date   CHOL 128 07/04/2019   HDL 61 07/04/2019   LDLCALC 53 07/04/2019   TRIG 66 07/04/2019   CHOLHDL 3 11/28/2018   Lab Results  Component Value Date   ALT 9 10/28/2019   AST 16 10/28/2019   ALKPHOS 73 10/28/2019   BILITOT 0.3 10/28/2019   The ASCVD Risk score Stacey Page DC Jr., et al., 2013) failed to calculate for the following reasons:   The valid total cholesterol range is 130 to 320 mg/dL  Type 2 diabetes mellitus with other specified complication, without long-term current use of insulin ([place]). [name] is taking Januvia.   Lab Results  Component Value Date   HGBA1C 6.0 (H) 10/28/2019   HGBA1C 5.9 (H) 07/04/2019   HGBA1C 6.0 (H) 03/25/2019   Lab Results  Component Value Date   MICROALBUR 1.9 01/16/2018   LDLCALC 53 07/04/2019   CREATININE 1.54 (H) 10/28/2019   Lab Results  Component Value Date   INSULIN 8.6 10/28/2019   INSULIN 9.2 07/04/2019   INSULIN 8.8 03/25/2019   Assessment/Plan:   Other hyperlipidemia. Cardiovascular risk and specific lipid/LDL goals reviewed.  We discussed several lifestyle modifications today and Fallen will continue to work on diet, exercise and weight loss efforts. Orders and follow up as documented in patient record. She will  continue Lipitor as directed.   Counseling Intensive lifestyle modifications are the first line treatment for this issue. . Dietary changes: Increase soluble fiber. Decrease simple carbohydrates. . Exercise changes: Moderate to vigorous-intensity aerobic activity 150 minutes per week if tolerated. . Lipid-lowering medications: see documented in medical record.  Type 2 diabetes mellitus with other specified complication, without long-term current use of insulin ([place]). Good blood sugar control is important to decrease the likelihood of diabetic complications such as nephropathy, neuropathy, limb loss, blindness, coronary artery disease, and death. Intensive lifestyle modification including diet, exercise and weight loss are the first line of treatment for diabetes. Stacey Page will continue Januvia as directed, increase her activity, and continue to decrease her carbohydrate intake.  Class 3 severe obesity with serious comorbidity and body mass index (BMI) of 50.0 to 59.9 in adult, unspecified obesity type ([place]).  Stacey Page is currently in the action stage of change. As such, her goal is to continue with weight loss efforts. She has agreed to the Category 3 Plan.   She will work on meal planning and continuing to decrease her carbohydrate intake.  Exercise goals: Older adults should follow the adult guidelines. When older adults cannot meet the adult guidelines, they should be as physically active as their abilities and conditions will allow.   Behavioral modification strategies: increasing lean protein intake, decreasing simple carbohydrates, increasing vegetables, increasing water intake, decreasing eating out, no skipping meals, meal planning and cooking strategies, keeping healthy foods in  the home and planning for success.  Stacey Page has agreed to follow-up with our clinic in 2-3 weeks. She was informed of the importance of frequent follow-up visits to maximize her success with intensive  lifestyle modifications for her multiple health conditions.   Objective:   Blood pressure 120/74, temperature 97.8 F (36.6 C), height 5\' 6"  (1.676 m), weight (!) 335 lb (152 kg). Body mass index is 54.07 kg/m.  General: Cooperative, alert, well developed, in no acute distress. HEENT: Conjunctivae and lids unremarkable. Cardiovascular: Regular rhythm.  Lungs: Normal work of breathing. Neurologic: No focal deficits.   Lab Results  Component Value Date   CREATININE 1.54 (H) 10/28/2019   BUN 21 10/28/2019   NA 142 10/28/2019   K 4.7 10/28/2019   CL 102 10/28/2019   CO2 27 10/28/2019   Lab Results  Component Value Date   ALT 9 10/28/2019   AST 16 10/28/2019   ALKPHOS 73 10/28/2019   BILITOT 0.3 10/28/2019   Lab Results  Component Value Date   HGBA1C 6.0 (H) 10/28/2019   HGBA1C 5.9 (H) 07/04/2019   HGBA1C 6.0 (H) 03/25/2019   HGBA1C 6.1 11/28/2018   HGBA1C 6.2 01/16/2018   Lab Results  Component Value Date   INSULIN 8.6 10/28/2019   INSULIN 9.2 07/04/2019   INSULIN 8.8 03/25/2019   Lab Results  Component Value Date   TSH 0.51 11/28/2018   Lab Results  Component Value Date   CHOL 128 07/04/2019   HDL 61 07/04/2019   LDLCALC 53 07/04/2019   TRIG 66 07/04/2019   CHOLHDL 3 11/28/2018   Lab Results  Component Value Date   WBC 4.8 12/24/2018   HGB 12.7 12/24/2018   HCT 41.4 12/24/2018   MCV 95.8 12/24/2018   PLT 213 12/24/2018   No results found for: IRON, TIBC, FERRITIN  Obesity Behavioral Intervention:   Approximately 15 minutes were spent on the discussion below.  ASK: We discussed the diagnosis of obesity with Stacey Page today and Stacey Page agreed to give [name] permission to discuss obesity behavioral modification therapy today.  ASSESS: Bhavika has the diagnosis of obesity and her BMI today is 54.2. Jaclyn is in the action stage of change.   ADVISE: Stacey Page was educated on the multiple health risks of obesity as well as the benefit of weight  loss to improve her health. She was advised of the need for long term treatment and the importance of lifestyle modifications to improve her current health and to decrease her risk of future health problems.  AGREE: Multiple dietary modification options and treatment options were discussed and Silvina agreed to follow the recommendations documented in the above note.  ARRANGE: Vidhi was educated on the importance of frequent visits to treat obesity as outlined per CMS and USPSTF guidelines and agreed to schedule her next follow up appointment today.  Attestation Statements:   Reviewed by clinician on day of visit: allergies, medications, problem list, medical history, surgical history, family history, social history, and previous encounter notes.  Migdalia Dk, am acting as Location manager for [employer], DO   I have reviewed the above documentation for accuracy and completeness, and I agree with the above. Jearld Lesch, DO

## 2019-12-10 ENCOUNTER — Encounter (INDEPENDENT_AMBULATORY_CARE_PROVIDER_SITE_OTHER): Payer: Self-pay | Admitting: Bariatrics

## 2019-12-13 DIAGNOSIS — N1832 Chronic kidney disease, stage 3b: Secondary | ICD-10-CM | POA: Diagnosis not present

## 2019-12-13 DIAGNOSIS — E1122 Type 2 diabetes mellitus with diabetic chronic kidney disease: Secondary | ICD-10-CM | POA: Diagnosis not present

## 2019-12-13 DIAGNOSIS — I129 Hypertensive chronic kidney disease with stage 1 through stage 4 chronic kidney disease, or unspecified chronic kidney disease: Secondary | ICD-10-CM | POA: Diagnosis not present

## 2019-12-13 DIAGNOSIS — I5032 Chronic diastolic (congestive) heart failure: Secondary | ICD-10-CM | POA: Diagnosis not present

## 2019-12-19 ENCOUNTER — Other Ambulatory Visit: Payer: PPO

## 2019-12-19 DIAGNOSIS — Z20822 Contact with and (suspected) exposure to covid-19: Secondary | ICD-10-CM

## 2019-12-20 ENCOUNTER — Telehealth: Payer: Self-pay

## 2019-12-20 NOTE — Progress Notes (Addendum)
Chronic Care Management Pharmacy Assistant   Name: Fardowsa Authier  MRN: [id number] DOB: 1950/09/21  Reason for Encounter:Hyperlipidema  Disease State and Medication Review call.  PCP : Flossie Buffy, NP  Allergies:  No Known Allergies  Medications: Outpatient Encounter Medications as of 12/20/2019  Medication Sig   acetaminophen (TYLENOL) 500 MG tablet Take 1 tablet (500 mg total) by mouth every 6 (six) hours as needed.   albuterol (PROAIR HFA) 108 (90 Base) MCG/ACT inhaler Inhale 2 puffs into the lungs every 6 (six) hours as needed for wheezing or shortness of breath.   aspirin 81 MG tablet Take 1 tablet (81 mg total) by mouth daily with breakfast.   atorvastatin (LIPITOR) 20 MG tablet Take 1 tablet (20 mg total) by mouth daily at 6 PM.   benzonatate (TESSALON) 100 MG capsule Take 1 capsule (100 mg total) by mouth 3 (three) times daily as needed for cough.   Calcium Carbonate-Vitamin D (CALCIUM 600+D) 600-400 MG-UNIT tablet Take 1 tablet by mouth daily.   carvedilol (COREG) 25 MG tablet Take 1 tablet (25 mg total) by mouth 2 (two) times daily.   Cholecalciferol (VITAMIN D3) 1.25 MG (50000 UT) CAPS Take 1 capsule by mouth once a week.   Clobetasol Prop Emollient Base (CLOBETASOL PROPIONATE E) 0.05 % emollient cream Apply 1 application topically daily as needed (skin irritations).   DM-GG & DM-APAP-CPM (CORICIDIN HBP DAY/NIGHT COLD) 10-20 &15-200-2 MG MISC Take 1 tablet by mouth every 6 (six) hours as needed.   ferrous sulfate (FEROSUL) 325 (65 FE) MG tablet Take 325 mg by mouth daily with breakfast.   furosemide (LASIX) 40 MG tablet TAKE 1 TAB DAILY, TAKE AN ADDITIONAL TAB AT 2 PM IF INCREASED SWELLING AND/OR SHORTNESS OF BREATH   hydrALAZINE (APRESOLINE) 25 MG tablet Take 12.5 mg by mouth 3 (three) times daily.   lisinopril (ZESTRIL) 20 MG tablet TAKE ONE TABLET BY MOUTH ONCE DAILY WITH BREAKFAST   mometasone (NASONEX) 50 MCG/ACT nasal spray Place 2 sprays into the nose  daily.   montelukast (SINGULAIR) 10 MG tablet Take 1 tablet (10 mg total) by mouth at bedtime.   sitaGLIPtin (JANUVIA) 100 MG tablet Take 1 tablet (100 mg total) by mouth daily.   vitamin C (ASCORBIC ACID) 500 MG tablet Take 500 mg by mouth daily.    No facility-administered encounter medications on file as of 12/20/2019.    Current Diagnosis: Patient Active Problem List   Diagnosis Date Noted   Type 2 DM with CKD stage 4 and hypertension ([place]) 07/09/2019   Diabetes mellitus type 2 in obese ([place]) 07/02/2019   Polyneuropathy associated with underlying disease ([place]) 11/28/2018   Severe episode of recurrent major depressive disorder, without psychotic features ([place]) 11/28/2018   Type 2 diabetes mellitus with diabetic peripheral angiopathy without gangrene, without long-term current use of insulin ([place]) 11/28/2018   Poor short term memory 11/28/2018   Mild aortic stenosis by prior echocardiogram 11/06/2018   CKD (chronic kidney disease) stage 3, GFR 30-59 ml/min ([facility]) 03/17/2016   Symptomatic anemia 03/17/2016   S/P total knee arthroplasty, right 03/17/2016   Unilateral primary osteoarthritis, right knee    Hyperlipidemia LDL goal <70 12/15/2015   Pain in both lower extremities 11/19/2015   Numbness in both hands 08/13/2015   Panic attack 06/09/2015   Right knee pain 05/14/2015   Chronic renal insufficiency 09/16/2014   Pneumothorax on right    Atypical mycobacterium infection    ILD (interstitial lung disease) 2/2 MAI s/p  med rxn    Routine general medical examination at a health care facility 04/03/2014   Morbid obesity ([place]) 04/03/2014   OSA (obstructive sleep apnea) 02/25/2014   Hypertensive heart disease with chronic diastolic congestive heart failure ([place]) 02/25/2014   DOE (dyspnea on exertion) 02/25/2014   Essential hypertension 02/25/2014   Follow-Up:  Pharmacist Review   Reviewed chart for medication changes ahead of medication coordination call.  OVs, Consults, or  hospital visits since last care coordination call/Pharmacist visit.  11/28/19 Cardiology Glenetta Hew  12/09/19 Bariatrics Jearld Lesch  Medication changes indicated  Patient states her Hydralazine was change from 1/2 tablet three times a day to a whole tablet three times a day. BP Readings from Last 3 Encounters:  12/09/19 120/74  11/28/19 126/62  11/18/19 138/60     Lab Results  Component Value Date   HGBA1C 6.0 (H) 10/28/2019     Patient obtains medications through [facility]  90 Days   Last adherence delivery included: (medication name and frequency)   Hydralazine 25 Tablet 1/2 Tablet three times Daily,Breakfast ,Lunch,bedtime             Vitamin D3 50000 unit  Capsule -Weekly  Patient declined (meds) last month due to PRN use/additional supply on hand.   APAP (gets OTC)             Albuterol inhaler (not currently using)              Aspirin 81 (gets OTC)             Atorvastatin (Adqeuate supply).              Calcium + Vit D (gets OTC)             Clobetasol (no longer taking)             Ferrous sulfate (gets OTC)             Januvia (gets for free through her work insurance)              Nasonex (gets OTC)             Vitamin C (gets OTC)             Carvedilol 25 mg twice daily (adequate supply)             Montelukast 10 mg daily (adequate supply)-20 on hand             Lisinopril 20 mg daily (adequate supply)-30 on hand              Furosemide 40 mg (adequate supply)  Patient is due for next adherence delivery on: No fill needed . Called patient and reviewed medications and coordinated delivery.  This delivery to include: None ID  Patient declined the following medications (meds) due to (reason)   APAP (gets OTC)             Albuterol inhaler (not currently using)              Aspirin 81 (gets OTC)             Atorvastatin (Adqeuate supply).              Calcium + Vit D (gets OTC)             Clobetasol (no longer taking)             Ferrous sulfate (gets  OTC)  Januvia (gets for free through her work insurance)              Nasonex (gets OTC)             Vitamin C (gets OTC)             Carvedilol 25 mg twice daily (adequate supply)             Montelukast 10 mg daily (adequate supply)             Lisinopril 20 mg daily (adequate supply)              Furosemide 40 mg (adequate supply)   Hydralazine 25 Tablet  Tablet three times Daily,Breakfast ,Lunch,bedtime (adequate supply)             Vitamin D3 50000 unit  Capsule -Weekly (adequate supply)  Patient needs refills for None ID.  Confirmed delivery date of no fill needed, advised patient that pharmacy will contact them the morning of delivery.  Blood Pressure readings:  On 12/23/19 it was 190/76 (patient states she did not take her blood pressure medication before she checks her blood pressure).  On 12/24/19 it was 153/73.  On 12/25/19 it was 162/75. Patient states she is unsure if her wrist monitor is accurate.Patient states she is going to take her monitor to be check to confirm if it is working correctly.  I informed patient when she checks her blood pressure to be sure she is resting for at least ten mintues sitting down using her left arm with feet uncross ,not talking with a empty bladder.  12/26/2019 Name: Tecia Cinnamon MRN: [id number] DOB: Feb 24, 1950 Anjolie Majer is a 69 y.o. year old female who is a primary care patient of [name], [name], NP.  Comprehensive medication review performed; Spoke to patient regarding cholesterol  Lipid Panel    Component Value Date/Time   CHOL 128 07/04/2019 1240   TRIG 66 07/04/2019 1240   HDL 61 07/04/2019 1240   LDLCALC 53 07/04/2019 1240    10-year ASCVD risk score: The ASCVD Risk score [name], et al., 2013) failed to calculate for the following reasons:   The valid total cholesterol range is 130 to 320 mg/dL  Current antihyperlipidemic regimen:  Atorvastatin 20 Mg Tablet Daily  Patient states she takes  atorvastatin as instructed without missing a dose and denies any side effects. Previous antihyperlipidemic medications tried: None ID ASCVD risk enhancing conditions: DM, HTN and CKD What recent interventions/DTPs have been made by any provider to improve Cholesterol control since last CPP Visit: None ID Any recent hospitalizations or ED visits since last visit with CPP? No What diet changes have been made to improve Cholesterol?  None  Adherence Review: Does the patient have >5 day gap between last estimated fill dates? Yes   [place] Pharmacist Assistant [phone]

## 2019-12-21 LAB — NOVEL CORONAVIRUS, NAA: SARS-CoV-2, NAA: DETECTED — AB

## 2019-12-21 LAB — SARS-COV-2, NAA 2 DAY TAT

## 2019-12-22 ENCOUNTER — Other Ambulatory Visit: Payer: Self-pay | Admitting: Nurse Practitioner

## 2019-12-22 DIAGNOSIS — U071 COVID-19: Secondary | ICD-10-CM

## 2019-12-22 NOTE — Progress Notes (Signed)
I connected by phone with Stacey Page on 12/22/2019 at 9:51 AM to discuss the potential use of a treatment for mild to moderate COVID-19 viral infection in non-hospitalized patients.  This patient is a 69 y.o. female that meets the FDA criteria for Emergency Use Authorization of bamlanivimab/etesevimab, casirivimab\imdevimab, or sotrovimab  Has a (+) direct SARS-CoV-2 viral test result  Has mild or moderate COVID-19   Is ? 68 years of age and weighs ? 40 kg  Is NOT hospitalized due to COVID-19  Is NOT requiring oxygen therapy or requiring an increase in baseline oxygen flow rate due to COVID-19  Is within 10 days of symptom onset  Has at least one of the high risk factor(s) for progression to severe COVID-19 and/or hospitalization as defined in EUA.  Specific high risk criteria : Older age (>/= 68 yo), BMI > 25, Diabetes and Cardiovascular disease or hypertension   I have spoken and communicated the following to the patient or parent/caregiver:  1. FDA has authorized the emergency use of bamlanivimab/etesevimab, casirivimab\imdevimab, or sotrovimab for the treatment of mild to moderate COVID-19 in adults and pediatric patients with positive results of direct SARS-CoV-2 viral testing who are 69 years of age and older weighing at least 40 kg, and who are at high risk for progressing to severe COVID-19 and/or hospitalization.  2. The significant known and potential risks and benefits of bamlanivimab/etesevimab, casirivimab\imdevimab, or sotrovimab, and the extent to which such potential risks and benefits are unknown.  3. Information on available alternative treatments and the risks and benefits of those alternatives, including clinical trials.  4. Patients treated with bamlanivimab/etesevimab, casirivimab\imdevimab, or sotrovimab should continue to self-isolate and use infection control measures (e.g., wear mask, isolate, social distance, avoid sharing personal items, clean and  disinfect "high touch" surfaces, and frequent handwashing) according to CDC guidelines.   5. The patient or parent/caregiver has the option to accept or refuse bamlanivimab/etesevimab, casirivimab\imdevimab, or sotrovimab.  After reviewing this information with the patient, the patient has agreed to receive one of the available covid 19 monoclonal antibodies and will be provided an appropriate fact sheet prior to infusion.Beckey Rutter, [place], AGNP-C [phone] ([place])

## 2019-12-23 ENCOUNTER — Ambulatory Visit (INDEPENDENT_AMBULATORY_CARE_PROVIDER_SITE_OTHER): Payer: PPO | Admitting: Bariatrics

## 2019-12-23 ENCOUNTER — Ambulatory Visit (HOSPITAL_COMMUNITY)
Admission: RE | Admit: 2019-12-23 | Discharge: 2019-12-23 | Disposition: A | Discharge status: Home / Self Care | Payer: PPO | Source: Ambulatory Visit | Attending: Pulmonary Disease | Admitting: Pulmonary Disease

## 2019-12-23 DIAGNOSIS — E119 Type 2 diabetes mellitus without complications: Secondary | ICD-10-CM | POA: Diagnosis not present

## 2019-12-23 DIAGNOSIS — R54 Age-related physical debility: Secondary | ICD-10-CM | POA: Insufficient documentation

## 2019-12-23 DIAGNOSIS — Z23 Encounter for immunization: Secondary | ICD-10-CM | POA: Insufficient documentation

## 2019-12-23 DIAGNOSIS — Z6825 Body mass index (BMI) 25.0-25.9, adult: Secondary | ICD-10-CM | POA: Insufficient documentation

## 2019-12-23 DIAGNOSIS — U071 COVID-19: Secondary | ICD-10-CM | POA: Diagnosis not present

## 2019-12-23 DIAGNOSIS — I10 Essential (primary) hypertension: Secondary | ICD-10-CM | POA: Diagnosis not present

## 2019-12-23 MED ORDER — SOTROVIMAB 500 MG/8ML IV SOLN
500.0000 mg | Freq: Once | INTRAVENOUS | Status: AC
  Administered 2019-12-23: 500 mg via INTRAVENOUS

## 2019-12-23 MED ORDER — DIPHENHYDRAMINE HCL 50 MG/ML IJ SOLN: 50.0000 mg | Freq: Once | INTRAMUSCULAR | Status: DC | PRN

## 2019-12-23 MED ORDER — FAMOTIDINE IN NACL 20-0.9 MG/50ML-% IV SOLN: 20.0000 mg | Freq: Once | INTRAVENOUS | Status: DC | PRN

## 2019-12-23 MED ORDER — ALBUTEROL SULFATE HFA 108 (90 BASE) MCG/ACT IN AERS: 2.0000 | INHALATION_SPRAY | Freq: Once | RESPIRATORY_TRACT | Status: DC | PRN

## 2019-12-23 MED ORDER — METHYLPREDNISOLONE SODIUM SUCC 125 MG IJ SOLR: 125.0000 mg | Freq: Once | INTRAMUSCULAR | Status: DC | PRN

## 2019-12-23 MED ORDER — EPINEPHRINE 0.3 MG/0.3ML IJ SOAJ: 0.3000 mg | Freq: Once | INTRAMUSCULAR | Status: DC | PRN

## 2019-12-23 MED ORDER — SODIUM CHLORIDE 0.9 % IV SOLN: INTRAVENOUS | Status: DC | PRN

## 2019-12-23 NOTE — Progress Notes (Deleted)
Diagnosis: COVID-19  Physician: Dr. Patrick Wright  Procedure: [facility] Infusion [facility] Med: Sotrovimab infusion - Provided patient with sotrovimab fact sheet for patients, parents, and caregivers prior to infusion.   Complications: No immediate complications noted  Discharge: Discharged home    

## 2019-12-23 NOTE — Discharge Instructions (Signed)

## 2019-12-23 NOTE — Progress Notes (Addendum)
  Diagnosis: COVID-19  Physician: Asencion Noble  Procedure: Sotrovimab  Complications: No immediate complications noted.  Discharge: Discharged home   [place] 12/23/2019

## 2020-01-07 ENCOUNTER — Other Ambulatory Visit: Payer: Self-pay

## 2020-01-07 ENCOUNTER — Encounter (INDEPENDENT_AMBULATORY_CARE_PROVIDER_SITE_OTHER): Payer: Self-pay | Admitting: Bariatrics

## 2020-01-07 ENCOUNTER — Telehealth: Payer: Self-pay | Admitting: Nurse Practitioner

## 2020-01-07 ENCOUNTER — Ambulatory Visit (INDEPENDENT_AMBULATORY_CARE_PROVIDER_SITE_OTHER): Payer: PPO | Admitting: Bariatrics

## 2020-01-07 VITALS — BP 97/59 | HR 77 | Temp 97.6°F | Ht 66.0 in | Wt 333.0 lb

## 2020-01-07 DIAGNOSIS — E1169 Type 2 diabetes mellitus with other specified complication: Secondary | ICD-10-CM | POA: Diagnosis not present

## 2020-01-07 DIAGNOSIS — IMO0001 Reserved for inherently not codable concepts without codable children: Secondary | ICD-10-CM

## 2020-01-07 DIAGNOSIS — E785 Hyperlipidemia, unspecified: Secondary | ICD-10-CM

## 2020-01-07 DIAGNOSIS — I129 Hypertensive chronic kidney disease with stage 1 through stage 4 chronic kidney disease, or unspecified chronic kidney disease: Secondary | ICD-10-CM | POA: Diagnosis not present

## 2020-01-07 DIAGNOSIS — E1122 Type 2 diabetes mellitus with diabetic chronic kidney disease: Secondary | ICD-10-CM | POA: Diagnosis not present

## 2020-01-07 DIAGNOSIS — Z6841 Body Mass Index (BMI) 40.0 and over, adult: Secondary | ICD-10-CM

## 2020-01-07 MED ORDER — ATORVASTATIN CALCIUM 20 MG PO TABS: 20.0000 mg | ORAL_TABLET | Freq: Every day | ORAL | 0 refills | Status: DC

## 2020-01-07 NOTE — Telephone Encounter (Signed)
Caller Name: Bessie w/[facility] Call back phone #: [phone]  MEDICATION(S): atorvastatin (LIPITOR) 20 MG tablet 90 day supply requested  Has the patient contacted their pharmacy? Yes.  Pharmacy called  Preferred Pharmacy:  [facility] - [place], [place] - [address] Phone:  [phone]  Fax:  [phone]

## 2020-01-07 NOTE — Progress Notes (Signed)
Chief Complaint:   OBESITY Stacey Page is here to discuss her progress with her obesity treatment plan along with follow-up of her obesity related diagnoses. Stacey Page is on the Category 4 Plan and states she is following her eating plan approximately 50% of the time. [name] states she is walking 20 minutes 3 times per week.  Today's visit was #: 15 Starting weight: 344 lbs Starting date: 03/25/2019 Today's weight: 333 lbs Today's date: 01/07/2020 Total lbs lost to date: 11 Total lbs lost since last in-office visit: 2  Interim History: Stacey Page is down an additional 2 lbs over the holiday.  Subjective:   Type 2 diabetes mellitus with chronic kidney disease and hypertension ([place]). Stacey Page is on Januvia and reports taking it as directed.   Lab Results  Component Value Date   HGBA1C 6.0 (H) 10/28/2019   HGBA1C 5.9 (H) 07/04/2019   HGBA1C 6.0 (H) 03/25/2019   Lab Results  Component Value Date   MICROALBUR 1.9 01/16/2018   LDLCALC 53 07/04/2019   CREATININE 1.54 (H) 10/28/2019   Lab Results  Component Value Date   INSULIN 8.6 10/28/2019   INSULIN 9.2 07/04/2019   INSULIN 8.8 03/25/2019   Hyperlipidemia associated with type 2 diabetes mellitus ([place]). Stacey Page is taking Lipitor. No myalgias.  Lab Results  Component Value Date   CHOL 128 07/04/2019   HDL 61 07/04/2019   LDLCALC 53 07/04/2019   TRIG 66 07/04/2019   CHOLHDL 3 11/28/2018   Lab Results  Component Value Date   ALT 9 10/28/2019   AST 16 10/28/2019   ALKPHOS 73 10/28/2019   BILITOT 0.3 10/28/2019   The ASCVD Risk score [name], et al., 2013) failed to calculate for the following reasons:   The valid total cholesterol range is 130 to 320 mg/dL  Assessment/Plan:   Type 2 diabetes mellitus with chronic kidney disease and hypertension ([place]). Good blood sugar control is important to decrease the likelihood of diabetic complications such as nephropathy, neuropathy, limb loss, blindness,  coronary artery disease, and death. Intensive lifestyle modification including diet, exercise and weight loss are the first line of treatment for diabetes. Jaleea will continue her medication as directed.   Hyperlipidemia associated with type 2 diabetes mellitus ([place]). Cardiovascular risk and specific lipid/LDL goals reviewed.  We discussed several lifestyle modifications today and Stacey Page will continue to work on diet, exercise and weight loss efforts. Orders and follow up as documented in patient record. She will continue Lipitor as directed.   Counseling Intensive lifestyle modifications are the first line treatment for this issue. . Dietary changes: Increase soluble fiber. Decrease simple carbohydrates. . Exercise changes: Moderate to vigorous-intensity aerobic activity 150 minutes per week if tolerated. . Lipid-lowering medications: see documented in medical record.  Class 3 severe obesity with serious comorbidity and body mass index (BMI) of 50.0 to 59.9 in adult, unspecified obesity type ([place]).  Stacey Page is currently in the action stage of change. As such, her goal is to continue with weight loss efforts. She has agreed to the Category 4 Plan.   She will work on meal planning and mindful eating.   Exercise goals: Older adults should follow the adult guidelines. When older adults cannot meet the adult guidelines, they should be as physically active as their abilities and conditions will allow.   Behavioral modification strategies: increasing lean protein intake, decreasing simple carbohydrates, increasing vegetables, increasing water intake, decreasing eating out, no skipping meals, meal planning and cooking strategies, keeping healthy foods  in the home and planning for success.  Stacey Page has agreed to follow-up with our clinic in 2-3 weeks. She was informed of the importance of frequent follow-up visits to maximize her success with intensive lifestyle modifications for her multiple  health conditions.   Objective:   Blood pressure (!) 97/59, pulse 77, temperature 97.6 F (36.4 C), height 5\' 6"  (1.676 m), weight (!) 333 lb (151 kg), SpO2 98 %. Body mass index is 53.75 kg/m.  General: Cooperative, alert, well developed, in no acute distress. HEENT: Conjunctivae and lids unremarkable. Cardiovascular: Regular rhythm.  Lungs: Normal work of breathing. Neurologic: No focal deficits.   Lab Results  Component Value Date   CREATININE 1.54 (H) 10/28/2019   BUN 21 10/28/2019   NA 142 10/28/2019   K 4.7 10/28/2019   CL 102 10/28/2019   CO2 27 10/28/2019   Lab Results  Component Value Date   ALT 9 10/28/2019   AST 16 10/28/2019   ALKPHOS 73 10/28/2019   BILITOT 0.3 10/28/2019   Lab Results  Component Value Date   HGBA1C 6.0 (H) 10/28/2019   HGBA1C 5.9 (H) 07/04/2019   HGBA1C 6.0 (H) 03/25/2019   HGBA1C 6.1 11/28/2018   HGBA1C 6.2 01/16/2018   Lab Results  Component Value Date   INSULIN 8.6 10/28/2019   INSULIN 9.2 07/04/2019   INSULIN 8.8 03/25/2019   Lab Results  Component Value Date   TSH 0.51 11/28/2018   Lab Results  Component Value Date   CHOL 128 07/04/2019   HDL 61 07/04/2019   LDLCALC 53 07/04/2019   TRIG 66 07/04/2019   CHOLHDL 3 11/28/2018   Lab Results  Component Value Date   WBC 4.8 12/24/2018   HGB 12.7 12/24/2018   HCT 41.4 12/24/2018   MCV 95.8 12/24/2018   PLT 213 12/24/2018   No results found for: IRON, TIBC, FERRITIN  Obesity Behavioral Intervention:   Approximately 15 minutes were spent on the discussion below.  ASK: We discussed the diagnosis of obesity with Stacey Page today and Stacey Page agreed to give [name] permission to discuss obesity behavioral modification therapy today.  ASSESS: Stacey Page has the diagnosis of obesity and her BMI today is 53.9. Stacey Page is in the action stage of change.   ADVISE: Stacey Page was educated on the multiple health risks of obesity as well as the benefit of weight loss to improve  her health. She was advised of the need for long term treatment and the importance of lifestyle modifications to improve her current health and to decrease her risk of future health problems.  AGREE: Multiple dietary modification options and treatment options were discussed and Yareni agreed to follow the recommendations documented in the above note.  ARRANGE: Alaiyah was educated on the importance of frequent visits to treat obesity as outlined per CMS and USPSTF guidelines and agreed to schedule her next follow up appointment today.  Attestation Statements:   Reviewed by clinician on day of visit: allergies, medications, problem list, medical history, surgical history, family history, social history, and previous encounter notes.  Migdalia Dk, am acting as Location manager for [employer], DO   I have reviewed the above documentation for accuracy and completeness, and I agree with the above. Jearld Lesch, DO

## 2020-01-07 NOTE — Telephone Encounter (Signed)
Rx sent to pharmacy   

## 2020-01-16 ENCOUNTER — Other Ambulatory Visit: Payer: Self-pay | Admitting: Nurse Practitioner

## 2020-01-16 DIAGNOSIS — N1831 Chronic kidney disease, stage 3a: Secondary | ICD-10-CM

## 2020-01-16 DIAGNOSIS — I10 Essential (primary) hypertension: Secondary | ICD-10-CM

## 2020-01-16 NOTE — Assessment & Plan Note (Signed)
Under care of [facility], last OV 12/13/2019

## 2020-01-17 ENCOUNTER — Telehealth: Payer: Self-pay

## 2020-01-17 NOTE — Progress Notes (Signed)
Chronic Care Management Pharmacy Assistant   Name: Stacey Page  MRN: [id number] DOB: [birth year]  Reason for Encounter: Medication Review  PCP : Flossie Buffy, NP  Allergies:  No Known Allergies  Medications: Outpatient Encounter Medications as of 01/17/2020  Medication Sig  . acetaminophen (TYLENOL) 500 MG tablet Take 1 tablet (500 mg total) by mouth every 6 (six) hours as needed.  Marland Kitchen albuterol (PROAIR HFA) 108 (90 Base) MCG/ACT inhaler Inhale 2 puffs into the lungs every 6 (six) hours as needed for wheezing or shortness of breath.  Marland Kitchen aspirin 81 MG tablet Take 1 tablet (81 mg total) by mouth daily with breakfast.  . atorvastatin (LIPITOR) 20 MG tablet Take 1 tablet (20 mg total) by mouth daily at 6 PM.  . benzonatate (TESSALON) 100 MG capsule Take 1 capsule (100 mg total) by mouth 3 (three) times daily as needed for cough.  . Calcium Carbonate-Vitamin D (CALCIUM 600+D) 600-400 MG-UNIT tablet Take 1 tablet by mouth daily.  . carvedilol (COREG) 25 MG tablet Take 1 tablet (25 mg total) by mouth 2 (two) times daily.  . Clobetasol Prop Emollient Base (CLOBETASOL PROPIONATE E) 0.05 % emollient cream Apply 1 application topically daily as needed (skin irritations).  Marland Kitchen DM-GG & DM-APAP-CPM (CORICIDIN HBP DAY/NIGHT COLD) 10-20 &15-200-2 MG MISC Take 1 tablet by mouth every 6 (six) hours as needed.  . ferrous sulfate (FEROSUL) 325 (65 FE) MG tablet Take 325 mg by mouth daily with breakfast.  . furosemide (LASIX) 40 MG tablet TAKE 1 TAB DAILY, TAKE AN ADDITIONAL TAB AT 2 PM IF INCREASED SWELLING AND/OR SHORTNESS OF BREATH  . hydrALAZINE (APRESOLINE) 25 MG tablet Take 1 tablet (25 mg total) by mouth in the morning and at bedtime.  Marland Kitchen lisinopril (ZESTRIL) 20 MG tablet TAKE ONE TABLET BY MOUTH ONCE DAILY WITH BREAKFAST  . mometasone (NASONEX) 50 MCG/ACT nasal spray Place 2 sprays into the nose daily.  . montelukast (SINGULAIR) 10 MG tablet Take 1 tablet (10 mg total) by mouth at bedtime.   . sitaGLIPtin (JANUVIA) 100 MG tablet Take 1 tablet (100 mg total) by mouth daily.  . vitamin C (ASCORBIC ACID) 500 MG tablet Take 500 mg by mouth daily.    No facility-administered encounter medications on file as of 01/17/2020.    Current Diagnosis: Patient Active Problem List   Diagnosis Date Noted  . Type 2 DM with CKD stage 4 and hypertension ([place]) 07/09/2019  . Diabetes mellitus type 2 in obese ([place]) 07/02/2019  . Polyneuropathy associated with underlying disease ([place]) 11/28/2018  . Severe episode of recurrent major depressive disorder, without psychotic features ([place]) 11/28/2018  . Type 2 diabetes mellitus with diabetic peripheral angiopathy without gangrene, without long-term current use of insulin ([place]) 11/28/2018  . Poor short term memory 11/28/2018  . Mild aortic stenosis by prior echocardiogram 11/06/2018  . CKD (chronic kidney disease) stage 3, GFR 30-59 ml/min ([facility]) 03/17/2016  . Symptomatic anemia 03/17/2016  . S/P total knee arthroplasty, right 03/17/2016  . Unilateral primary osteoarthritis, right knee   . Hyperlipidemia LDL goal <70 12/15/2015  . Pain in both lower extremities 11/19/2015  . Numbness in both hands 08/13/2015  . Panic attack 06/09/2015  . Right knee pain 05/14/2015  . Chronic renal insufficiency 09/16/2014  . Pneumothorax on right   . Atypical mycobacterium infection   . ILD (interstitial lung disease) 2/2 MAI s/p med rxn   . Routine general medical examination at a health care facility 04/03/2014  . Morbid  obesity ([place]) 04/03/2014  . OSA (obstructive sleep apnea) 02/25/2014  . Hypertensive heart disease with chronic diastolic congestive heart failure ([place]) 02/25/2014  . DOE (dyspnea on exertion) 02/25/2014  . Essential hypertension 02/25/2014     Follow-Up:  Pharmacist Review   Reviewed chart for medication changes ahead of medication coordination call.  OVs, Consults, or hospital visits since last care coordination call/Pharmacist  visit.  01/07/2020 Bariatrics Jearld Lesch No medication changes indicated .  BP Readings from Last 3 Encounters:  01/07/20 (!) 97/59  12/23/19 (!) 190/71  12/09/19 120/74    Lab Results  Component Value Date   HGBA1C 6.0 (H) 10/28/2019     Patient obtains medications through Vials  90 Days   Last adherence delivery included:   None ID Patient declined (meds) last month due to PRN use/additional supply on hand.  APAP (gets OTC) Albuterol inhaler (not currently using)  Aspirin 81 (gets OTC) Atorvastatin (Adqeuate supply). Calcium + Vit D (gets OTC) Clobetasol (no longer taking) Ferrous sulfate(gets OTC) Januvia (gets for free through her work insurance)  Nasonex (gets OTC) Vitamin C (gets OTC) Carvedilol 25 mg twice daily(adequate supply) Montelukast 10 mg daily(adequate supply) Lisinopril 20 mg daily(adequate supply) Furosemide 40 mg (adequate supply)             Hydralazine 25 Tablet  Tablet three times Daily,Breakfast ,Lunch,bedtime (adequate supply) Vitamin D3 50000 unit Capsule -Weekly (adequate supply)   Patient is due for next adherence delivery on: No Fill Needed . Called patient and reviewed medications and coordinated delivery.  This delivery to include:None ID  Patient declined the following medications (meds) due to (reason)  APAP (gets OTC) Albuterol inhaler (not currently using)  Aspirin 81 (gets OTC) Atorvastatin (Adqeuate supply). Calcium + Vit D (gets OTC) Clobetasol (no longer taking) Ferrous sulfate(gets OTC) Januvia (gets for free through her work insurance)  Nasonex (gets OTC) Vitamin C (gets OTC) Carvedilol 25 mg twice daily(adequate  supply) Montelukast 10 mg daily(adequate supply) Lisinopril 20 mg daily(adequate supply) Furosemide 40 mg (adequate supply)             Hydralazine 25 Tablet  Tablet three times Daily,Breakfast ,Lunch,bedtime (adequate supply) Vitamin D3 50000 unit Capsule -Weekly (adequate supply)   Patient needs refills for None ID.  Confirmed delivery date of No Fill Needed , advised patient that pharmacy will contact them the morning of delivery.  [place] Pharmacist Assistant [phone]

## 2020-01-28 ENCOUNTER — Ambulatory Visit (INDEPENDENT_AMBULATORY_CARE_PROVIDER_SITE_OTHER): Payer: PPO | Admitting: Bariatrics

## 2020-01-28 ENCOUNTER — Encounter (INDEPENDENT_AMBULATORY_CARE_PROVIDER_SITE_OTHER): Payer: Self-pay | Admitting: Bariatrics

## 2020-01-28 ENCOUNTER — Other Ambulatory Visit: Payer: Self-pay

## 2020-01-28 VITALS — BP 182/88 | HR 66 | Temp 98.0°F | Ht 66.0 in | Wt 338.0 lb

## 2020-01-28 DIAGNOSIS — I10 Essential (primary) hypertension: Secondary | ICD-10-CM

## 2020-01-28 DIAGNOSIS — E66813 Obesity, class 3: Secondary | ICD-10-CM

## 2020-01-28 DIAGNOSIS — Z6841 Body Mass Index (BMI) 40.0 and over, adult: Secondary | ICD-10-CM | POA: Diagnosis not present

## 2020-01-28 DIAGNOSIS — E785 Hyperlipidemia, unspecified: Secondary | ICD-10-CM | POA: Diagnosis not present

## 2020-01-28 DIAGNOSIS — E1169 Type 2 diabetes mellitus with other specified complication: Secondary | ICD-10-CM | POA: Diagnosis not present

## 2020-01-28 NOTE — Progress Notes (Signed)
Chief Complaint:   OBESITY Stacey Page is here to discuss her progress with her obesity treatment plan along with follow-up of her obesity related diagnoses. Stacey Page is on the Category 3 Plan and states she is following her eating plan approximately 50% of the time. Stacey Page states she is walking 10 minutes 5 times per week.  Today's visit was #: 29 Starting weight: 344 lbs Starting date: 03/25/2019 Today's weight: 338 lbs Today's date: 01/28/2020 Total lbs lost to date: 6 Total lbs lost since last in-office visit: 0  Interim History: Stacey Page is up 5 lbs. The bioimpedance scale does not show increase in water.  Subjective:   Essential hypertension. Stacey Page is taking Coreg, apresoline, and Zestril. Blood pressure is elevated today (she did not take her blood pressure medications).  BP Readings from Last 3 Encounters:  01/28/20 (!) 182/88  01/07/20 (!) 97/59  12/23/19 (!) 190/71   Lab Results  Component Value Date   CREATININE 1.54 (H) 10/28/2019   CREATININE 2.08 (H) 07/04/2019   CREATININE 1.57 (H) 12/24/2018   Hyperlipidemia associated with type 2 diabetes mellitus (Stacey Page). Stacey Page is taking Lipitor.  Lab Results  Component Value Date   CHOL 128 07/04/2019   HDL 61 07/04/2019   LDLCALC 53 07/04/2019   TRIG 66 07/04/2019   CHOLHDL 3 11/28/2018   Lab Results  Component Value Date   ALT 9 10/28/2019   AST 16 10/28/2019   ALKPHOS 73 10/28/2019   BILITOT 0.3 10/28/2019   The ASCVD Risk score Stacey Page DC Jr., et al., 2013) failed to calculate for the following reasons:   The valid total cholesterol range is 130 to 320 mg/dL  Assessment/Plan:   Essential hypertension. Stacey Page is working on healthy weight loss and exercise to improve blood pressure control. We will watch for signs of hypotension as she continues her lifestyle modifications. She will continue her medications as directed.  Hyperlipidemia associated with type 2 diabetes mellitus (Stacey Page).  Cardiovascular risk and specific lipid/LDL goals reviewed.  We discussed several lifestyle modifications today and Stacey Page will continue to work on diet, exercise and weight loss efforts. Orders and follow up as documented in patient record. Stacey Page will continue her medication as directed, avoid trans fats, and decrease saturated fats.  Counseling Intensive lifestyle modifications are the first line treatment for this issue.  Dietary changes: Increase soluble fiber. Decrease simple carbohydrates.  Exercise changes: Moderate to vigorous-intensity aerobic activity 150 minutes per week if tolerated.   Lipid-lowering medications: see documented in medical record.  Class 3 severe obesity with serious comorbidity and body mass index (BMI) of 50.0 to 59.9 in adult, unspecified obesity type (Stacey Page).  Stacey Page is currently in the action stage of change. As such, her goal is to continue with weight loss efforts. She has agreed to the Category 3 Plan.   She will work on meal planning, intentional eating, and increasing her water and protein intake.   Exercise goals: Stacey Page is using Stacey Page for exercise.  Behavioral modification strategies: increasing lean protein intake, decreasing simple carbohydrates, increasing vegetables, increasing water intake, decreasing eating out, no skipping meals, meal planning and cooking strategies, keeping healthy foods in the home, dealing with family or coworker sabotage, travel eating strategies, holiday eating strategies , avoiding temptations and planning for success.  Stacey Page has agreed to follow-up with our clinic in 2-3 weeks. She was informed of the importance of frequent follow-up visits to maximize her success with intensive lifestyle modifications for her multiple health conditions.  Objective:   Blood pressure (!) 182/88, pulse 66, temperature 98 F (36.7 C), temperature source Oral, height 5\' 6"  (1.676 m), weight (!) 338 lb (153.3 kg), SpO2 (!) 87  %. Body mass index is 54.55 kg/m.  General: Cooperative, alert, well developed, in no acute distress. HEENT: Conjunctivae and lids unremarkable. Cardiovascular: Regular rhythm.  Lungs: Normal work of breathing. Neurologic: No focal deficits.  Extremities: Pt has edema in lower extremities, left greater than right.  Lab Results  Component Value Date   CREATININE 1.54 (H) 10/28/2019   BUN 21 10/28/2019   NA 142 10/28/2019   K 4.7 10/28/2019   CL 102 10/28/2019   CO2 27 10/28/2019   Lab Results  Component Value Date   ALT 9 10/28/2019   AST 16 10/28/2019   ALKPHOS 73 10/28/2019   BILITOT 0.3 10/28/2019   Lab Results  Component Value Date   HGBA1C 6.0 (H) 10/28/2019   HGBA1C 5.9 (H) 07/04/2019   HGBA1C 6.0 (H) 03/25/2019   HGBA1C 6.1 11/28/2018   HGBA1C 6.2 01/16/2018   Lab Results  Component Value Date   INSULIN 8.6 10/28/2019   INSULIN 9.2 07/04/2019   INSULIN 8.8 03/25/2019   Lab Results  Component Value Date   TSH 0.51 11/28/2018   Lab Results  Component Value Date   CHOL 128 07/04/2019   HDL 61 07/04/2019   LDLCALC 53 07/04/2019   TRIG 66 07/04/2019   CHOLHDL 3 11/28/2018   Lab Results  Component Value Date   WBC 4.8 12/24/2018   HGB 12.7 12/24/2018   HCT 41.4 12/24/2018   MCV 95.8 12/24/2018   PLT 213 12/24/2018   No results found for: IRON, TIBC, FERRITIN  Obesity Behavioral Intervention:   Approximately 15 minutes were spent on the discussion below.  ASK: We discussed the diagnosis of obesity with Stacey Page today and Stacey Page agreed to give [name] permission to discuss obesity behavioral modification therapy today.  ASSESS: Stacey Page has the diagnosis of obesity and her BMI today is 54.7. Stacey Page is in the action stage of change.   ADVISE: Stacey Page was educated on the multiple health risks of obesity as well as the benefit of weight loss to improve her health. She was advised of the need for long term treatment and the importance of  lifestyle modifications to improve her current health and to decrease her risk of future health problems.  AGREE: Multiple dietary modification options and treatment options were discussed and Stacey Page agreed to follow the recommendations documented in the above note.  ARRANGE: Stacey Page was educated on the importance of frequent visits to treat obesity as outlined per CMS and USPSTF guidelines and agreed to schedule her next follow up appointment today.  Attestation Statements:   Reviewed by clinician on day of visit: allergies, medications, problem list, medical history, surgical history, family history, social history, and previous encounter notes.  Migdalia Dk, am acting as Location manager for [employer], DO   I have reviewed the above documentation for accuracy and completeness, and I agree with the above. Jearld Lesch, DO

## 2020-01-29 ENCOUNTER — Encounter (INDEPENDENT_AMBULATORY_CARE_PROVIDER_SITE_OTHER): Payer: Self-pay | Admitting: Bariatrics

## 2020-01-29 ENCOUNTER — Encounter: Payer: Self-pay | Admitting: Family

## 2020-01-29 ENCOUNTER — Ambulatory Visit (INDEPENDENT_AMBULATORY_CARE_PROVIDER_SITE_OTHER): Payer: PPO | Admitting: Family

## 2020-01-29 VITALS — BP 132/68 | HR 68 | Temp 96.6°F | Ht 66.0 in | Wt 342.0 lb

## 2020-01-29 DIAGNOSIS — N6341 Unspecified lump in right breast, subareolar: Secondary | ICD-10-CM | POA: Diagnosis not present

## 2020-01-29 DIAGNOSIS — N644 Mastodynia: Secondary | ICD-10-CM | POA: Diagnosis not present

## 2020-01-29 DIAGNOSIS — Z23 Encounter for immunization: Secondary | ICD-10-CM | POA: Diagnosis not present

## 2020-01-29 NOTE — Patient Instructions (Signed)
Fibrocystic Breast Changes  Fibrocystic breast changes are changes that can make your breasts swollen or painful. These changes happen when tiny sacs of fluid (cysts) form in the breast. This is a common condition. It does not mean that you have cancer. It usually happens because of hormone changes during a monthly period. Follow these instructions at home:  Check your breasts after every monthly period. If you do not have monthly periods, check your breasts on the first day of every month. Check for: ? Soreness. ? New swelling or puffiness. ? A change in breast size. ? A change in a lump that was already there.  Take over-the-counter and prescription medicines only as told by your doctor.  Wear a support or sports bra that fits well. Wear this support especially when you are exercising.  Avoid or have less caffeine, fat, and sugar in what you eat and drink as told by your doctor. Contact a doctor if:  You have fluid coming from your nipple, especially if the fluid has blood in it.  You have new lumps or bumps in your breast.  Your breast gets puffy, red, and painful.  You have changes in how your breast looks.  Your nipple looks flat or it sinks into your breast. Get help right away if:  Your breast turns red, and the redness is spreading. Summary  Fibrocystic breast changes are changes that can make your breasts swollen or painful.  This condition can happen when you have hormone changes during your monthly period.  With this condition, it is important to check your breasts after every monthly period. If you do not have monthly periods, check your breasts on the first day of every month. This information is not intended to replace advice given to you by your health care provider. Make sure you discuss any questions you have with your health care provider. Document Revised: 05/17/2018 Document Reviewed: 10/08/2015 Elsevier Patient Education  2020 [employer]  

## 2020-01-29 NOTE — Progress Notes (Signed)
Acute Office Visit  Subjective:    Patient ID: Stacey Page, female    DOB: 01-03-1951, 69 y.o.   MRN: [id number]  Chief Complaint  Patient presents with  . Acute Visit    Lump on rt breast, pt states she has had x2weeks shooting pains, tender to touch, denies any swelling or redness, denies injury     HPI Patient is in today with c/o right breast pain and lumps throughout her right breast x 2 weeks. She denies any drainage or discharge, no redness or swelling, no changes in the skin Occasionally has a sharp pain that comes and goes through her right breast. She had a lumpectomy in the past of the right breast. Admits to large caffeine intake  Past Medical History:  Diagnosis Date  . Anxiety    doesn't take any meds  . Arthritis of right knee 03/14/2016  . Asthma   . Back pain   . Chronic diastolic CHF (congestive heart failure) (HCC)    HF with Preserved EF (60-65%) - Grade II Diastolic Dysfunction (Hypertensive Heart Disease). takes Furosemide daily  . COPD (chronic obstructive pulmonary disease) (HCC)    Albuterol as needed  . Depression    doesn't take meds  . Diabetes ([place])    takes Januvia daily  . Eczema    uses cream as needed  . GERD (gastroesophageal reflux disease)   . History of bronchitis as a child   . HTN (hypertension)   . Insomnia   . Mild aortic stenosis by prior echocardiogram 04/2017   Mild stenosis: Mean gradient 15 mmHg, peak gradient 28 mmHg  . OA (osteoarthritis)   . Obese   . OSA (obstructive sleep apnea) 02/25/2014   wears CPAP at night  . Peripheral neuropathy    takes Gabapentin as needed  . Pneumonia    hx of-2010  . Seasonal allergies    uses Flonase daily  . Sleep apnea   . SOB (shortness of breath)     Past Surgical History:  Procedure Laterality Date  . BREAST LUMPECTOMY WITH RADIOACTIVE SEED LOCALIZATION Right 12/28/2018   Procedure: RIGHT BREAST LUMPECTOMY WITH RADIOACTIVE SEED LOCALIZATION;  Surgeon: Jovita Kussmaul, MD;   Location: [place];  Service: General;  Laterality: Right;  . BREAST SURGERY    . CESAREAN SECTION  x2  . COLONOSCOPY    . CORONARY CALCIUM SCORE & CT ANGIOGRAM  09/2017   Coronary Ca Score = 11 (low).  CTA- no obstructive CAD (minimal disease)  . JOINT REPLACEMENT    . KNEE ARTHROSCOPY Right   . LUNG BIOPSY Right 06/10/2014   Procedure: LUNG BIOPSY;  Surgeon: Ivin Poot, MD;  Location: [place];  Service: Thoracic;  Laterality: Right;  . POLYPECTOMY     throat  . TOTAL KNEE ARTHROPLASTY Right 03/14/2016   Procedure: RIGHT TOTAL KNEE ARTHROPLASTY;  Surgeon: Marybelle Killings, MD;  Location: [place];  Service: Orthopedics;  Laterality: Right;  . TRANSTHORACIC ECHOCARDIOGRAM  11/04/2019   EF 60-65%.  Moderate LVH.  GRII DD.  Mild LV dilation.  Mild LA dilation.  Thickened-calcified aortic valve-Mild AS (mean gradient 13 mmHg, peak 29 mmHg--similar to 2019)  . VIDEO ASSISTED THORACOSCOPY Right 06/10/2014   Procedure: VIDEO ASSISTED THORACOSCOPY;  Surgeon: Ivin Poot, MD;  Location: [facility] OR;  Service: Thoracic;  Laterality: Right;    Family History  Problem Relation Age of Onset  . COPD Father   . Hypothyroidism Father   . Anemia Father  iron deficiency  . High blood pressure Father   . Alcoholism Father   . Cancer Mother 6       pancreatic  . High blood pressure Mother   . High Cholesterol Mother   . Breast cancer Neg Hx     Social History   Socioeconomic History  . Marital status: Widowed    Spouse name: Not on file  . Number of children: Not on file  . Years of education: Not on file  . Highest education level: Not on file  Occupational History  . Occupation: retired  Tobacco Use  . Smoking status: Former Smoker    Packs/day: 0.25    Years: 15.00    Pack years: 3.75    Types: Cigarettes    Quit date: 02/08/1980    Years since quitting: 40.0  . Smokeless tobacco: Former Network engineer  . Vaping Use: Never used  Substance and Sexual Activity  . Alcohol use: Yes     Alcohol/week: 0.0 standard drinks    Comment: occassional/social/rare  . Drug use: Not Currently  . Sexual activity: Not Currently  Other Topics Concern  . Not on file  Social History Narrative  . Not on file   Social Determinants of Health   Financial Resource Strain: Low Risk   . Difficulty of Paying Living Expenses: Not hard at all  Food Insecurity: No Food Insecurity  . Worried About Charity fundraiser in the Last Year: Never true  . Ran Out of Food in the Last Year: Never true  Transportation Needs: No Transportation Needs  . Lack of Transportation (Medical): No  . Lack of Transportation (Non-Medical): No  Physical Activity: Inactive  . Days of Exercise per Week: 0 days  . Minutes of Exercise per Session: 0 min  Stress: No Stress Concern Present  . Feeling of Stress : Not at all  Social Connections: Socially Isolated  . Frequency of Communication with Friends and Family: More than three times a week  . Frequency of Social Gatherings with Friends and Family: Once a week  . Attends Religious Services: Never  . Active Member of Clubs or Organizations: No  . Attends Archivist Meetings: Never  . Marital Status: Widowed  Intimate Partner Violence: Not on file    Outpatient Medications Prior to Visit  Medication Sig Dispense Refill  . acetaminophen (TYLENOL) 500 MG tablet Take 1 tablet (500 mg total) by mouth every 6 (six) hours as needed. 30 tablet 0  . albuterol (PROAIR HFA) 108 (90 Base) MCG/ACT inhaler Inhale 2 puffs into the lungs every 6 (six) hours as needed for wheezing or shortness of breath. 1 Inhaler 3  . aspirin 81 MG tablet Take 1 tablet (81 mg total) by mouth daily with breakfast. 90 tablet 0  . atorvastatin (LIPITOR) 20 MG tablet Take 1 tablet (20 mg total) by mouth daily at 6 PM. 90 tablet 0  . benzonatate (TESSALON) 100 MG capsule Take 1 capsule (100 mg total) by mouth 3 (three) times daily as needed for cough. 20 capsule 0  . Calcium  Carbonate-Vitamin D 600-400 MG-UNIT tablet Take 1 tablet by mouth daily.    . Clobetasol Prop Emollient Base (CLOBETASOL PROPIONATE E) 0.05 % emollient cream Apply 1 application topically daily as needed (skin irritations). 30 g 2  . DM-GG & DM-APAP-CPM (CORICIDIN HBP DAY/NIGHT COLD) 10-20 &15-200-2 MG MISC Take 1 tablet by mouth every 6 (six) hours as needed.    . ferrous sulfate  325 (65 FE) MG tablet Take 325 mg by mouth daily with breakfast.    . furosemide (LASIX) 40 MG tablet TAKE 1 TAB DAILY, TAKE AN ADDITIONAL TAB AT 2 PM IF INCREASED SWELLING AND/OR SHORTNESS OF BREATH 180 tablet 1  . hydrALAZINE (APRESOLINE) 25 MG tablet Take 1 tablet (25 mg total) by mouth in the morning and at bedtime.    Marland Kitchen lisinopril (ZESTRIL) 20 MG tablet TAKE ONE TABLET BY MOUTH ONCE DAILY WITH BREAKFAST 90 tablet 0  . mometasone (NASONEX) 50 MCG/ACT nasal spray Place 2 sprays into the nose daily. 17 g 11  . montelukast (SINGULAIR) 10 MG tablet Take 1 tablet (10 mg total) by mouth at bedtime. 90 tablet 3  . sitaGLIPtin (JANUVIA) 100 MG tablet Take 1 tablet (100 mg total) by mouth daily. 90 tablet 3  . vitamin C (ASCORBIC ACID) 500 MG tablet Take 500 mg by mouth daily.     . carvedilol (COREG) 25 MG tablet Take 1 tablet (25 mg total) by mouth 2 (two) times daily. 180 tablet 3   No facility-administered medications prior to visit.    No Known Allergies  Review of Systems  Constitutional: Negative.   Respiratory: Negative.   Cardiovascular: Negative.   Musculoskeletal: Negative.   Skin:       Right breast: lumps and pain   Allergic/Immunologic: Negative.   Neurological: Negative.   Psychiatric/Behavioral: Negative.        Objective:    Physical Exam Constitutional:      Appearance: Normal appearance.  Cardiovascular:     Rate and Rhythm: Normal rate and regular rhythm.  Pulmonary:     Effort: Pulmonary effort is normal.     Breath sounds: Normal breath sounds.  Chest:  Breasts: Breasts are  symmetrical.     Right: Mass and tenderness present. No swelling, bleeding, inverted nipple, nipple discharge or skin change.     Left: Tenderness present. No swelling, inverted nipple, nipple discharge or skin change.    Musculoskeletal:     Cervical back: Normal range of motion and neck supple.  Skin:    General: Skin is warm and dry.  Neurological:     General: No focal deficit present.     Mental Status: She is alert and oriented to person, place, and time.  Psychiatric:        Mood and Affect: Mood normal.     BP 132/68 (BP Location: Left Arm, Patient Position: Sitting, Cuff Size: Large)   Pulse 68   Temp (!) 96.6 F (35.9 C) (Temporal)   Ht 5\' 6"  (1.676 m)   Wt (!) 342 lb (155.1 kg)   SpO2 97%   BMI 55.20 kg/m  Wt Readings from Last 3 Encounters:  01/29/20 (!) 342 lb (155.1 kg)  01/28/20 (!) 338 lb (153.3 kg)  01/07/20 (!) 333 lb (151 kg)    Health Maintenance Due  Topic Date Due  . TETANUS/TDAP  02/07/2017  . COVID-19 Vaccine (3 - Pfizer risk 4-dose series) 05/21/2019  . INFLUENZA VACCINE  09/08/2019  . OPHTHALMOLOGY EXAM  11/01/2019  . FOOT EXAM  11/28/2019  . PNA vac Low Risk Adult (2 of 2 - PPSV23) 01/12/2020    There are no preventive care reminders to display for this patient.   Lab Results  Component Value Date   TSH 0.51 11/28/2018   Lab Results  Component Value Date   WBC 4.8 12/24/2018   HGB 12.7 12/24/2018   HCT 41.4 12/24/2018   MCV  95.8 12/24/2018   PLT 213 12/24/2018   Lab Results  Component Value Date   NA 142 10/28/2019   K 4.7 10/28/2019   CO2 27 10/28/2019   GLUCOSE 105 (H) 10/28/2019   BUN 21 10/28/2019   CREATININE 1.54 (H) 10/28/2019   BILITOT 0.3 10/28/2019   ALKPHOS 73 10/28/2019   AST 16 10/28/2019   ALT 9 10/28/2019   PROT 7.1 10/28/2019   ALBUMIN 4.1 10/28/2019   CALCIUM 9.9 10/28/2019   ANIONGAP 9 12/24/2018   GFR 55.28 (L) 11/28/2018   Lab Results  Component Value Date   CHOL 128 07/04/2019   Lab Results   Component Value Date   HDL 61 07/04/2019   Lab Results  Component Value Date   LDLCALC 53 07/04/2019   Lab Results  Component Value Date   TRIG 66 07/04/2019   Lab Results  Component Value Date   CHOLHDL 3 11/28/2018   Lab Results  Component Value Date   HGBA1C 6.0 (H) 10/28/2019       Assessment & Plan:   Problem List Items Addressed This Visit   None   Visit Diagnoses    Breast pain    -  Primary   Relevant Orders   MM Digital Diagnostic Unilat R   Lump in central portion of right breast       Relevant Orders   MM Digital Diagnostic Unilat R         Call the office with any questions or concerns. Decrease caffeine intake. Follow-up pending diagnostic mammogram   Kennyth Arnold, FNP

## 2020-02-12 ENCOUNTER — Telehealth: Payer: Self-pay

## 2020-02-12 ENCOUNTER — Other Ambulatory Visit: Payer: Self-pay

## 2020-02-12 NOTE — Progress Notes (Addendum)
Chronic Care Management Pharmacy Assistant   Name: Stacey Page  MRN: [id number] DOB: October 11, 1950  Reason for Encounter: Medication Review  PCP : Flossie Buffy, NP  Allergies:  No Known Allergies  Medications: Outpatient Encounter Medications as of 02/12/2020  Medication Sig   acetaminophen (TYLENOL) 500 MG tablet Take 1 tablet (500 mg total) by mouth every 6 (six) hours as needed.   albuterol (PROAIR HFA) 108 (90 Base) MCG/ACT inhaler Inhale 2 puffs into the lungs every 6 (six) hours as needed for wheezing or shortness of breath.   aspirin 81 MG tablet Take 1 tablet (81 mg total) by mouth daily with breakfast.   atorvastatin (LIPITOR) 20 MG tablet Take 1 tablet (20 mg total) by mouth daily at 6 PM.   benzonatate (TESSALON) 100 MG capsule Take 1 capsule (100 mg total) by mouth 3 (three) times daily as needed for cough.   Calcium Carbonate-Vitamin D 600-400 MG-UNIT tablet Take 1 tablet by mouth daily.   carvedilol (COREG) 25 MG tablet Take 1 tablet (25 mg total) by mouth 2 (two) times daily.   Clobetasol Prop Emollient Base (CLOBETASOL PROPIONATE E) 0.05 % emollient cream Apply 1 application topically daily as needed (skin irritations).   DM-GG & DM-APAP-CPM (CORICIDIN HBP DAY/NIGHT COLD) 10-20 &15-200-2 MG MISC Take 1 tablet by mouth every 6 (six) hours as needed.   ferrous sulfate 325 (65 FE) MG tablet Take 325 mg by mouth daily with breakfast.   furosemide (LASIX) 40 MG tablet TAKE 1 TAB DAILY, TAKE AN ADDITIONAL TAB AT 2 PM IF INCREASED SWELLING AND/OR SHORTNESS OF BREATH   hydrALAZINE (APRESOLINE) 25 MG tablet Take 1 tablet (25 mg total) by mouth in the morning and at bedtime.   lisinopril (ZESTRIL) 20 MG tablet TAKE ONE TABLET BY MOUTH ONCE DAILY WITH BREAKFAST   mometasone (NASONEX) 50 MCG/ACT nasal spray Place 2 sprays into the nose daily.   montelukast (SINGULAIR) 10 MG tablet Take 1 tablet (10 mg total) by mouth at bedtime.   sitaGLIPtin (JANUVIA) 100 MG tablet Take  1 tablet (100 mg total) by mouth daily.   vitamin C (ASCORBIC ACID) 500 MG tablet Take 500 mg by mouth daily.    No facility-administered encounter medications on file as of 02/12/2020.    Current Diagnosis: Patient Active Problem List   Diagnosis Date Noted   Type 2 DM with CKD stage 4 and hypertension ([place]) 07/09/2019   Diabetes mellitus type 2 in obese ([place]) 07/02/2019   Polyneuropathy associated with underlying disease ([place]) 11/28/2018   Severe episode of recurrent major depressive disorder, without psychotic features ([place]) 11/28/2018   Type 2 diabetes mellitus with diabetic peripheral angiopathy without gangrene, without long-term current use of insulin ([place]) 11/28/2018   Poor short term memory 11/28/2018   Mild aortic stenosis by prior echocardiogram 11/06/2018   CKD (chronic kidney disease) stage 3, GFR 30-59 ml/min (HCC) 03/17/2016   Symptomatic anemia 03/17/2016   S/P total knee arthroplasty, right 03/17/2016   Unilateral primary osteoarthritis, right knee    Hyperlipidemia LDL goal <70 12/15/2015   Pain in both lower extremities 11/19/2015   Numbness in both hands 08/13/2015   Panic attack 06/09/2015   Right knee pain 05/14/2015   Chronic renal insufficiency 09/16/2014   Pneumothorax on right    Atypical mycobacterium infection    ILD (interstitial lung disease) 2/2 MAI s/p med rxn    Routine general medical examination at a health care facility 04/03/2014   Morbid obesity ([place]) 04/03/2014  OSA (obstructive sleep apnea) 02/25/2014   Hypertensive heart disease with chronic diastolic congestive heart failure ([place]) 02/25/2014   DOE (dyspnea on exertion) 02/25/2014   Essential hypertension 02/25/2014    Goals Addressed   None    Reviewed chart for medication changes ahead of medication coordination call.  OVs, Consults, or hospital visits since last care coordination call/Pharmacist visit.  01/28/2020 Bariatrics [facility]  No medication changes indicated.  BP  Readings from Last 3 Encounters:  01/29/20 132/68  01/28/20 (!) 182/88  01/07/20 (!) 97/59    Lab Results  Component Value Date   HGBA1C 6.0 (H) 10/28/2019     Patient obtains medications through Vials  90 Days   Last adherence delivery included:  None ID  Patient declined the following medications              APAP (gets OTC)             Albuterol inhaler (not currently using)              Aspirin 81 (gets OTC)             Atorvastatin (Adqeuate supply).              Calcium + Vit D (gets OTC)             Clobetasol (no longer taking)             Ferrous sulfate (gets OTC)             Januvia (gets for free through her work insurance)              Nasonex (gets OTC)             Vitamin C (gets OTC)             Carvedilol 25 mg twice daily (adequate supply)             Montelukast 10 mg daily (adequate supply)             Lisinopril 20 mg daily (adequate supply)              Furosemide 40 mg (adequate supply)              Hydralazine 25 Tablet  Tablet three times Daily,Breakfast ,Lunch,bedtime (adequate supply)             Vitamin D3 50000 unit  Capsule -Weekly (adequate supply)  Patient is due for next adherence delivery on: 02/20/2020. Called patient and reviewed medications and coordinated delivery.  LVM 02/12/2020  This delivery to include : None ID Patient declined the following medications: Patient states she is going [facility] because [facility]". Patient states she will call me back to inform me of which [facility].      APAP (gets OTC)             Albuterol inhaler (not currently using)              Aspirin 81 (gets OTC)             Atorvastatin (Adqeuate supply).              Calcium + Vit D (gets OTC)             Clobetasol (no longer taking)             Ferrous sulfate (gets  OTC)             Januvia (gets for free through her work insurance)              Nasonex (gets OTC)              Vitamin C (gets OTC)             Carvedilol 25 mg twice daily (adequate supply)             Montelukast 10 mg daily (adequate supply)             Lisinopril 20 mg daily (adequate supply)              Furosemide 40 mg (adequate supply)              Hydralazine 25 Tablet  Tablet three times Daily,Breakfast ,Lunch,bedtime (adequate supply)             Vitamin D3 50000 unit  Capsule -Weekly (adequate supply)    Patient needs refills for None ID.  Confirmed delivery date of No Fill needed, advised patient that pharmacy will contact them the morning of delivery.   Follow-Up:  Pharmacist Review   [name] Clinical Pharmacist Assistant [phone]    4 minutes spent in review, coordination, and documentation. Doristine Section Clinical Pharmacist [facility]  [phone]

## 2020-02-13 ENCOUNTER — Ambulatory Visit (INDEPENDENT_AMBULATORY_CARE_PROVIDER_SITE_OTHER): Payer: Medicare Other | Admitting: Nurse Practitioner

## 2020-02-13 ENCOUNTER — Encounter: Payer: Self-pay | Admitting: Nurse Practitioner

## 2020-02-13 VITALS — BP 126/70 | HR 60 | Ht 66.0 in | Wt 341.0 lb

## 2020-02-13 DIAGNOSIS — H6122 Impacted cerumen, left ear: Secondary | ICD-10-CM

## 2020-02-13 DIAGNOSIS — I10 Essential (primary) hypertension: Secondary | ICD-10-CM

## 2020-02-13 DIAGNOSIS — E785 Hyperlipidemia, unspecified: Secondary | ICD-10-CM

## 2020-02-13 DIAGNOSIS — E1141 Type 2 diabetes mellitus with diabetic mononeuropathy: Secondary | ICD-10-CM

## 2020-02-13 DIAGNOSIS — J309 Allergic rhinitis, unspecified: Secondary | ICD-10-CM

## 2020-02-13 NOTE — Patient Instructions (Addendum)
Call breast center to schedule an appt for diagnostic mammogram: [phone].

## 2020-02-13 NOTE — Progress Notes (Signed)
Subjective:  Patient ID: Stacey Page, female    DOB: 29-Oct-1950  Age: 70 y.o. MRN: [id number]  CC: Acute Visit (Pt c/o bilateral ear pain x2 days. Denies any known injury. Pt states she thinks she has a lot of wax build up. )  Ear Fullness  There is pain in both ears. This is a new problem. The current episode started in the past 7 days. The problem occurs constantly. The problem has been unchanged. There has been no fever. The pain is mild. Pertinent negatives include no abdominal pain, coughing, diarrhea, ear discharge, headaches, hearing loss, neck pain, rash, rhinorrhea, sore throat or vomiting. She has tried nothing for the symptoms. There is no history of a chronic ear infection, hearing loss or a tympanostomy tube.   She also needs medications sent to [facility]-mail order.  Reviewed past Medical, Social and Family history today.  Outpatient Medications Prior to Visit  Medication Sig Dispense Refill  . acetaminophen (TYLENOL) 500 MG tablet Take 1 tablet (500 mg total) by mouth every 6 (six) hours as needed. 30 tablet 0  . albuterol (PROAIR HFA) 108 (90 Base) MCG/ACT inhaler Inhale 2 puffs into the lungs every 6 (six) hours as needed for wheezing or shortness of breath. 1 Inhaler 3  . aspirin 81 MG tablet Take 1 tablet (81 mg total) by mouth daily with breakfast. 90 tablet 0  . atorvastatin (LIPITOR) 20 MG tablet Take 1 tablet (20 mg total) by mouth daily at 6 PM. 90 tablet 0  . Calcium Carbonate-Vitamin D 600-400 MG-UNIT tablet Take 1 tablet by mouth daily.    . Clobetasol Prop Emollient Base (CLOBETASOL PROPIONATE E) 0.05 % emollient cream Apply 1 application topically daily as needed (skin irritations). 30 g 2  . furosemide (LASIX) 40 MG tablet TAKE 1 TAB DAILY, TAKE AN ADDITIONAL TAB AT 2 PM IF INCREASED SWELLING AND/OR SHORTNESS OF BREATH 180 tablet 1  . hydrALAZINE (APRESOLINE) 25 MG tablet Take 1 tablet (25 mg total) by mouth in the morning and at bedtime.    Marland Kitchen lisinopril  (ZESTRIL) 20 MG tablet TAKE ONE TABLET BY MOUTH ONCE DAILY WITH BREAKFAST 90 tablet 0  . mometasone (NASONEX) 50 MCG/ACT nasal spray Place 2 sprays into the nose daily. 17 g 11  . montelukast (SINGULAIR) 10 MG tablet Take 1 tablet (10 mg total) by mouth at bedtime. 90 tablet 3  . sitaGLIPtin (JANUVIA) 100 MG tablet Take 1 tablet (100 mg total) by mouth daily. 90 tablet 3  . vitamin C (ASCORBIC ACID) 500 MG tablet Take 500 mg by mouth daily.     . carvedilol (COREG) 25 MG tablet Take 1 tablet (25 mg total) by mouth 2 (two) times daily. 180 tablet 3  . benzonatate (TESSALON) 100 MG capsule Take 1 capsule (100 mg total) by mouth 3 (three) times daily as needed for cough. (Patient not taking: Reported on 02/13/2020) 20 capsule 0  . DM-GG & DM-APAP-CPM (CORICIDIN HBP DAY/NIGHT COLD) 10-20 &15-200-2 MG MISC Take 1 tablet by mouth every 6 (six) hours as needed. (Patient not taking: Reported on 02/13/2020)    . ferrous sulfate 325 (65 FE) MG tablet Take 325 mg by mouth daily with breakfast. (Patient not taking: Reported on 02/13/2020)     No facility-administered medications prior to visit.    ROS See HPI  Objective:  BP 126/70 (BP Location: Left Arm, Patient Position: Sitting, Cuff Size: Large)   Pulse 60   Ht 5\' 6"  (1.676 m)  Wt (!) 341 lb (154.7 kg)   SpO2 98%   BMI 55.04 kg/m   Physical Exam HENT:     Right Ear: Hearing, tympanic membrane, ear canal and external ear normal.     Left Ear: External ear normal. There is impacted cerumen.     Ears:     Comments: Normal left ear canal and TM after cerumen removal Neurological:     Mental Status: She is alert.   Procedure Note :    Procedure : cerumen removal  Indication:  Cerumen impaction, left   Risks, including pain, dizziness, eardrum perforation, bleeding, infection and others as well as benefits were explained to the patient in detail. Verbal consent was obtained and the patient agreed to proceed.   A large amount wax was recovered  from left ear canal with use of a curette.  Tolerated well. Complications: None.  Postprocedure instructions :  Call if problems.  Assessment & Plan:  This visit occurred during the SARS-CoV-2 public health emergency.  Safety protocols were in place, including screening questions prior to the visit, additional usage of staff PPE, and extensive cleaning of exam room while observing appropriate contact time as indicated for disinfecting solutions.   Stacey Page was seen today for acute visit.  Diagnoses and all orders for this visit:  Impacted cerumen of left ear   Problem List Items Addressed This Visit   None   Visit Diagnoses    Impacted cerumen of left ear    -  Primary      Follow-up: Return in about 3 months (around 05/13/2020) for CPE (fasting).  Wilfred Lacy, NP

## 2020-02-14 ENCOUNTER — Other Ambulatory Visit: Payer: Self-pay | Admitting: Family

## 2020-02-14 ENCOUNTER — Encounter: Payer: Self-pay | Admitting: Nurse Practitioner

## 2020-02-14 ENCOUNTER — Telehealth: Payer: Self-pay

## 2020-02-14 DIAGNOSIS — I10 Essential (primary) hypertension: Secondary | ICD-10-CM

## 2020-02-14 DIAGNOSIS — N644 Mastodynia: Secondary | ICD-10-CM

## 2020-02-14 DIAGNOSIS — N6341 Unspecified lump in right breast, subareolar: Secondary | ICD-10-CM

## 2020-02-14 MED ORDER — LISINOPRIL 20 MG PO TABS: ORAL_TABLET | ORAL | 3 refills | Status: DC

## 2020-02-14 MED ORDER — MONTELUKAST SODIUM 10 MG PO TABS: 10.0000 mg | ORAL_TABLET | Freq: Every day | ORAL | 3 refills | Status: DC

## 2020-02-14 MED ORDER — SITAGLIPTIN PHOSPHATE 100 MG PO TABS: 100.0000 mg | ORAL_TABLET | Freq: Every day | ORAL | 1 refills | Status: DC

## 2020-02-14 MED ORDER — ATORVASTATIN CALCIUM 20 MG PO TABS: 20.0000 mg | ORAL_TABLET | Freq: Every day | ORAL | 3 refills | Status: DC

## 2020-02-14 NOTE — Telephone Encounter (Signed)
Ok to send rx managed by me to [facility]

## 2020-02-14 NOTE — Telephone Encounter (Signed)
Patient calling to ask if Stacey Page can fax, [phone]) prescriptions or send all her medications over to new pharmacy [facility].  Pt had an office visit yesterday.  Please advise.

## 2020-02-18 ENCOUNTER — Encounter (INDEPENDENT_AMBULATORY_CARE_PROVIDER_SITE_OTHER): Payer: Self-pay | Admitting: Bariatrics

## 2020-02-18 ENCOUNTER — Other Ambulatory Visit: Payer: Self-pay

## 2020-02-18 ENCOUNTER — Ambulatory Visit (INDEPENDENT_AMBULATORY_CARE_PROVIDER_SITE_OTHER): Payer: Medicare Other | Admitting: Bariatrics

## 2020-02-18 VITALS — BP 132/64 | HR 67 | Temp 97.8°F | Ht 66.0 in | Wt 338.0 lb

## 2020-02-18 DIAGNOSIS — Z6841 Body Mass Index (BMI) 40.0 and over, adult: Secondary | ICD-10-CM | POA: Diagnosis not present

## 2020-02-18 DIAGNOSIS — E1122 Type 2 diabetes mellitus with diabetic chronic kidney disease: Secondary | ICD-10-CM

## 2020-02-18 DIAGNOSIS — I10 Essential (primary) hypertension: Secondary | ICD-10-CM | POA: Diagnosis not present

## 2020-02-18 MED ORDER — HYDRALAZINE HCL 25 MG PO TABS: 25.0000 mg | ORAL_TABLET | Freq: Two times a day (BID) | ORAL | 3 refills | Status: DC

## 2020-02-18 MED ORDER — CLOBETASOL PROP EMOLLIENT BASE 0.05 % EX CREA: 1.0000 "application " | TOPICAL_CREAM | Freq: Every day | CUTANEOUS | 2 refills | Status: DC | PRN

## 2020-02-18 MED ORDER — ALBUTEROL SULFATE HFA 108 (90 BASE) MCG/ACT IN AERS: 2.0000 | INHALATION_SPRAY | Freq: Four times a day (QID) | RESPIRATORY_TRACT | 3 refills | Status: DC | PRN

## 2020-02-18 NOTE — Addendum Note (Signed)
Addended by: Renette Butters on: 02/18/2020 02:08 PM   Modules accepted: Orders

## 2020-02-20 NOTE — Progress Notes (Signed)
Chief Complaint:   OBESITY Ambriel is here to discuss her progress with her obesity treatment plan along with follow-up of her obesity related diagnoses. Catlin is on the Category 2 Plan and the Category 3 Plan and states she is following her eating plan approximately 50% of the time. Skilynn states she is walking for 10 minutes 2 times per week.  Today's visit was #: 51 Starting weight: 344 lbs Starting date: 03/25/2019 Today's weight: 338 lbs Today's date: 02/18/2020 Total lbs lost to date: 6 lbs Total lbs lost since last in-office visit: 0  Interim History: Hasina's weight remains the same as at last visit.  Subjective:   1. Type 2 diabetes mellitus with chronic kidney disease, without long-term current use of insulin, unspecified CKD stage ([place]) Anderia is taking Januvia.  Lab Results  Component Value Date   HGBA1C 6.0 (H) 10/28/2019   HGBA1C 5.9 (H) 07/04/2019   HGBA1C 6.0 (H) 03/25/2019   Lab Results  Component Value Date   MICROALBUR 1.9 01/16/2018   LDLCALC 53 07/04/2019   CREATININE 1.54 (H) 10/28/2019   Lab Results  Component Value Date   INSULIN 8.6 10/28/2019   INSULIN 9.2 07/04/2019   INSULIN 8.8 03/25/2019   2. Essential hypertension Controlled.  Review: taking medications as instructed, no medication side effects noted, no chest pain on exertion, no dyspnea on exertion, no swelling of ankles.    BP Readings from Last 3 Encounters:  02/18/20 132/64  02/13/20 126/70  01/29/20 132/68   Assessment/Plan:   1. Type 2 diabetes mellitus with chronic kidney disease, without long-term current use of insulin, unspecified CKD stage (HCC) Good blood sugar control is important to decrease the likelihood of diabetic complications such as nephropathy, neuropathy, limb loss, blindness, coronary artery disease, and death. Intensive lifestyle modification including diet, exercise and weight loss are the first line of treatment for diabetes. Decrease  carbohydrates and increase healthy protein and fats.  2. Essential hypertension Aradhya is working on healthy weight loss and exercise to improve blood pressure control. We will watch for signs of hypotension as she continues her lifestyle modifications.  Continue medications.  3. Class 3 severe obesity with serious comorbidity and body mass index (BMI) of 50.0 to 59.9 in adult, unspecified obesity type ([place])  Ramona is currently in the action stage of change. As such, her goal is to continue with weight loss efforts. She has agreed to the Category 2 Plan and the Category 3 Plan.   Exercise goals: Increase walking.  Behavioral modification strategies: increasing lean protein intake, decreasing simple carbohydrates, increasing vegetables, increasing water intake, decreasing eating out, no skipping meals, meal planning and cooking strategies, keeping healthy foods in the home and planning for success.  Neeka has agreed to follow-up with our clinic in 2-3 weeks, fasting. She was informed of the importance of frequent follow-up visits to maximize her success with intensive lifestyle modifications for her multiple health conditions.   Objective:   Blood pressure 132/64, pulse 67, temperature 97.8 F (36.6 C), height 5\' 6"  (1.676 m), weight (!) 338 lb (153.3 kg), SpO2 98 %. Body mass index is 54.55 kg/m.  General: Cooperative, alert, well developed, in no acute distress. HEENT: Conjunctivae and lids unremarkable. Cardiovascular: Regular rhythm.  Lungs: Normal work of breathing. Neurologic: No focal deficits.   Lab Results  Component Value Date   CREATININE 1.54 (H) 10/28/2019   BUN 21 10/28/2019   NA 142 10/28/2019   K 4.7 10/28/2019   CL  102 10/28/2019   CO2 27 10/28/2019   Lab Results  Component Value Date   ALT 9 10/28/2019   AST 16 10/28/2019   ALKPHOS 73 10/28/2019   BILITOT 0.3 10/28/2019   Lab Results  Component Value Date   HGBA1C 6.0 (H) 10/28/2019   HGBA1C  5.9 (H) 07/04/2019   HGBA1C 6.0 (H) 03/25/2019   HGBA1C 6.1 11/28/2018   HGBA1C 6.2 01/16/2018   Lab Results  Component Value Date   INSULIN 8.6 10/28/2019   INSULIN 9.2 07/04/2019   INSULIN 8.8 03/25/2019   Lab Results  Component Value Date   TSH 0.51 11/28/2018   Lab Results  Component Value Date   CHOL 128 07/04/2019   HDL 61 07/04/2019   LDLCALC 53 07/04/2019   TRIG 66 07/04/2019   CHOLHDL 3 11/28/2018   Lab Results  Component Value Date   WBC 4.8 12/24/2018   HGB 12.7 12/24/2018   HCT 41.4 12/24/2018   MCV 95.8 12/24/2018   PLT 213 12/24/2018   Obesity Behavioral Intervention:   Approximately 15 minutes were spent on the discussion below.  ASK: We discussed the diagnosis of obesity with Gloriann today and Deneise agreed to give [name] permission to discuss obesity behavioral modification therapy today.  ASSESS: Shanitha has the diagnosis of obesity and her BMI today is 54.6. Ronee is in the action stage of change.   ADVISE: Lillybeth was educated on the multiple health risks of obesity as well as the benefit of weight loss to improve her health. She was advised of the need for long term treatment and the importance of lifestyle modifications to improve her current health and to decrease her risk of future health problems.  AGREE: Multiple dietary modification options and treatment options were discussed and Jaedynn agreed to follow the recommendations documented in the above note.  ARRANGE: Dedee was educated on the importance of frequent visits to treat obesity as outlined per CMS and USPSTF guidelines and agreed to schedule her next follow up appointment today.  Attestation Statements:   Reviewed by clinician on day of visit: allergies, medications, problem list, medical history, surgical history, family history, social history, and previous encounter notes.  I, Water quality scientist, CMA, am acting as Location manager for [employer], DO  I have  reviewed the above documentation for accuracy and completeness, and I agree with the above. Jearld Lesch, DO

## 2020-02-21 ENCOUNTER — Ambulatory Visit
Admission: RE | Admit: 2020-02-21 | Discharge: 2020-02-21 | Disposition: A | Discharge status: Home / Self Care | Payer: PPO | Source: Ambulatory Visit | Attending: Family | Admitting: Family

## 2020-02-21 ENCOUNTER — Telehealth: Payer: Self-pay | Admitting: Cardiology

## 2020-02-21 ENCOUNTER — Ambulatory Visit
Admission: RE | Admit: 2020-02-21 | Discharge: 2020-02-21 | Disposition: A | Discharge status: Home / Self Care | Payer: Medicare Other | Source: Ambulatory Visit | Attending: Family | Admitting: Family

## 2020-02-21 ENCOUNTER — Other Ambulatory Visit: Payer: Self-pay

## 2020-02-21 DIAGNOSIS — N644 Mastodynia: Secondary | ICD-10-CM

## 2020-02-21 DIAGNOSIS — R922 Inconclusive mammogram: Secondary | ICD-10-CM | POA: Diagnosis not present

## 2020-02-21 DIAGNOSIS — N6341 Unspecified lump in right breast, subareolar: Secondary | ICD-10-CM

## 2020-02-21 DIAGNOSIS — N6489 Other specified disorders of breast: Secondary | ICD-10-CM | POA: Diagnosis not present

## 2020-02-21 MED ORDER — FUROSEMIDE 40 MG PO TABS: ORAL_TABLET | ORAL | 2 refills | Status: DC

## 2020-02-21 MED ORDER — CARVEDILOL 25 MG PO TABS: 25.0000 mg | ORAL_TABLET | Freq: Two times a day (BID) | ORAL | 3 refills | Status: DC

## 2020-02-21 NOTE — Telephone Encounter (Signed)
Pt c/o medication issue:  1. Name of Medication:  carvedilol (COREG) 25 MG tablet furosemide (LASIX) 40 MG tablet  2. How are you currently taking this medication (dosage and times per day)? Lasix 1 tablet 3 times a week, coreg 1 tablet 2 times a day  3. Are you having a reaction (difficulty breathing--STAT)? no  4. What is your medication issue? Patient states the medications need authorization. She states [facility] needs the office to call: [phone]. Patient states she only has 1 pill left.

## 2020-02-21 NOTE — Telephone Encounter (Signed)
Spoke to patient . Informed her prescription have been sent to  Mail order as requested.  90 day supply with 3 refills for both. She states she will call [facility] to send her some medication until medication has arrived.

## 2020-02-25 ENCOUNTER — Encounter (INDEPENDENT_AMBULATORY_CARE_PROVIDER_SITE_OTHER): Payer: Self-pay | Admitting: Bariatrics

## 2020-02-25 ENCOUNTER — Telehealth: Payer: Self-pay

## 2020-02-25 NOTE — Progress Notes (Signed)
Patient requesting refill for 14 days of Carvedilol. Completed acute form for clinical pharmacist to view and send over to [facility].   [place] Pharmacist Assistant [phone]

## 2020-02-27 ENCOUNTER — Telehealth: Payer: Self-pay | Admitting: Nurse Practitioner

## 2020-02-27 NOTE — Telephone Encounter (Signed)
Pt called and said that [facility] is waiting on approval on fluticasone nasal spray. She said they had sent something last week

## 2020-03-04 NOTE — Telephone Encounter (Signed)
Informed pt that Rx was signed and sent off

## 2020-03-04 NOTE — Telephone Encounter (Signed)
Pt called to see if any progress on this has been made and requested call back

## 2020-03-10 ENCOUNTER — Ambulatory Visit (INDEPENDENT_AMBULATORY_CARE_PROVIDER_SITE_OTHER): Payer: Medicare Other | Admitting: Bariatrics

## 2020-03-10 ENCOUNTER — Other Ambulatory Visit: Payer: Self-pay

## 2020-03-10 ENCOUNTER — Encounter (INDEPENDENT_AMBULATORY_CARE_PROVIDER_SITE_OTHER): Payer: Self-pay | Admitting: Bariatrics

## 2020-03-10 VITALS — BP 125/73 | HR 68 | Temp 97.6°F | Ht 66.0 in | Wt 340.0 lb

## 2020-03-10 DIAGNOSIS — E669 Obesity, unspecified: Secondary | ICD-10-CM | POA: Diagnosis not present

## 2020-03-10 DIAGNOSIS — Z6841 Body Mass Index (BMI) 40.0 and over, adult: Secondary | ICD-10-CM | POA: Diagnosis not present

## 2020-03-10 DIAGNOSIS — I5032 Chronic diastolic (congestive) heart failure: Secondary | ICD-10-CM

## 2020-03-10 DIAGNOSIS — E1169 Type 2 diabetes mellitus with other specified complication: Secondary | ICD-10-CM | POA: Diagnosis not present

## 2020-03-10 DIAGNOSIS — I110 Hypertensive heart disease with heart failure: Secondary | ICD-10-CM | POA: Diagnosis not present

## 2020-03-10 NOTE — Progress Notes (Signed)
Chief Complaint:   OBESITY Saralynn is here to discuss her progress with her obesity treatment plan along with follow-up of her obesity related diagnoses. Lashawnta is on the Category 2 Plan and the Category 3 Plan and states she is following her eating plan approximately 0% of the time. Tanishka states she is not exercising regularly.  Today's visit was #: 23 Starting weight: 344 lbs Starting date: 03/25/2019 Today's weight: 340 lbs Today's date: 03/10/2020 Total lbs lost to date: 4 lbs Total lbs lost since last in-office visit: 0  Interim History: Azariyah is up 2 pounds.  Her sister is starting the program with another provider and she feels like this may be helpful to keep her on track.  Subjective:   1. Hypertensive heart disease with chronic diastolic congestive heart failure (HCC) Stable.    2. Type 2 diabetes mellitus with obesity ([place]) She is taking Januvia.  Denies hyperglycemia.  Lab Results  Component Value Date   HGBA1C 6.0 (H) 10/28/2019   HGBA1C 5.9 (H) 07/04/2019   HGBA1C 6.0 (H) 03/25/2019   Lab Results  Component Value Date   MICROALBUR 1.9 01/16/2018   LDLCALC 53 07/04/2019   CREATININE 1.54 (H) 10/28/2019   Lab Results  Component Value Date   INSULIN 8.6 10/28/2019   INSULIN 9.2 07/04/2019   INSULIN 8.8 03/25/2019   Assessment/Plan:   1. Hypertensive heart disease with chronic diastolic congestive heart failure (HCC) Continue medications.  2. Type 2 diabetes mellitus with obesity ([place]) Chudney will continue to work on weight loss, exercise, and decreasing simple carbohydrates to help decrease the risk of diabetes.  Continue medication.  3. Class 3 severe obesity with serious comorbidity and body mass index (BMI) of 50.0 to 59.9 in adult, unspecified obesity type ([place])  Jeneen is currently in the action stage of change. As such, her goal is to continue with weight loss efforts. She has agreed to the Category 2 Plan and the [place].  Can alternate.  She will work on meal planning and will start being more adherent to the plan.  Recipes II sheet was provided today.  Exercise goals: Increase walking.  Behavioral modification strategies: increasing lean protein intake, decreasing simple carbohydrates, increasing vegetables, increasing water intake, meal planning and cooking strategies, ways to avoid boredom eating, ways to avoid night time snacking, better snacking choices, emotional eating strategies and planning for success.  Darbie has agreed to follow-up with our clinic in 2-3 weeks. She was informed of the importance of frequent follow-up visits to maximize her success with intensive lifestyle modifications for her multiple health conditions.   Objective:   Blood pressure 125/73, pulse 68, temperature 97.6 F (36.4 C), height 5\' 6"  (1.676 m), weight (!) 340 lb (154.2 kg), SpO2 98 %. Body mass index is 54.88 kg/m.  General: Cooperative, alert, well developed, in no acute distress. HEENT: Conjunctivae and lids unremarkable. Cardiovascular: Regular rhythm.  Lungs: Normal work of breathing. Neurologic: No focal deficits.   Lab Results  Component Value Date   CREATININE 1.54 (H) 10/28/2019   BUN 21 10/28/2019   NA 142 10/28/2019   K 4.7 10/28/2019   CL 102 10/28/2019   CO2 27 10/28/2019   Lab Results  Component Value Date   ALT 9 10/28/2019   AST 16 10/28/2019   ALKPHOS 73 10/28/2019   BILITOT 0.3 10/28/2019   Lab Results  Component Value Date   HGBA1C 6.0 (H) 10/28/2019   HGBA1C 5.9 (H) 07/04/2019  HGBA1C 6.0 (H) 03/25/2019   HGBA1C 6.1 11/28/2018   HGBA1C 6.2 01/16/2018   Lab Results  Component Value Date   INSULIN 8.6 10/28/2019   INSULIN 9.2 07/04/2019   INSULIN 8.8 03/25/2019   Lab Results  Component Value Date   TSH 0.51 11/28/2018   Lab Results  Component Value Date   CHOL 128 07/04/2019   HDL 61 07/04/2019   LDLCALC 53 07/04/2019   TRIG 66 07/04/2019   CHOLHDL 3  11/28/2018   Lab Results  Component Value Date   WBC 4.8 12/24/2018   HGB 12.7 12/24/2018   HCT 41.4 12/24/2018   MCV 95.8 12/24/2018   PLT 213 12/24/2018   Obesity Behavioral Intervention:   Approximately 15 minutes were spent on the discussion below.  ASK: We discussed the diagnosis of obesity with Anahi today and Courtnie agreed to give [name] permission to discuss obesity behavioral modification therapy today.  ASSESS: Tiffani has the diagnosis of obesity and her BMI today is 54.55. Chaneka is in the action stage of change.   ADVISE: Ariam was educated on the multiple health risks of obesity as well as the benefit of weight loss to improve her health. She was advised of the need for long term treatment and the importance of lifestyle modifications to improve her current health and to decrease her risk of future health problems.  AGREE: Multiple dietary modification options and treatment options were discussed and Harbour agreed to follow the recommendations documented in the above note.  ARRANGE: Coletta was educated on the importance of frequent visits to treat obesity as outlined per CMS and USPSTF guidelines and agreed to schedule her next follow up appointment today.  Attestation Statements:   Reviewed by clinician on day of visit: allergies, medications, problem list, medical history, surgical history, family history, social history, and previous encounter notes.  I, Water quality scientist, CMA, am acting as Location manager for [employer], DO  I have reviewed the above documentation for accuracy and completeness, and I agree with the above. Jearld Lesch, DO

## 2020-03-11 ENCOUNTER — Encounter (INDEPENDENT_AMBULATORY_CARE_PROVIDER_SITE_OTHER): Payer: Self-pay | Admitting: Bariatrics

## 2020-03-16 ENCOUNTER — Telehealth: Payer: Self-pay

## 2020-03-16 NOTE — Progress Notes (Signed)
Patient asking if [facility] sale Ozempic.How much would it cost her to purchase.  Inform patient per clinical pharmacist,yes [facility] does sale Ozempic but patient would need a prescription first.It would not be cheaper than her mail order pharmacy. Only way to know for sure is to send the prescription here and run it. If she needs help with the cost of that medication we can help her with patient assistance, but we probably won't be better than the pharmacy she switched to.  Patient verbalized understand and states she will call her pharmacy.  [place] Pharmacist Assistant [phone]

## 2020-03-24 ENCOUNTER — Ambulatory Visit (INDEPENDENT_AMBULATORY_CARE_PROVIDER_SITE_OTHER): Payer: Medicare Other | Admitting: Bariatrics

## 2020-03-25 DIAGNOSIS — N1832 Chronic kidney disease, stage 3b: Secondary | ICD-10-CM | POA: Diagnosis not present

## 2020-03-26 ENCOUNTER — Ambulatory Visit (INDEPENDENT_AMBULATORY_CARE_PROVIDER_SITE_OTHER): Payer: Medicare Other | Admitting: Bariatrics

## 2020-03-26 ENCOUNTER — Other Ambulatory Visit: Payer: Self-pay

## 2020-03-26 ENCOUNTER — Encounter (INDEPENDENT_AMBULATORY_CARE_PROVIDER_SITE_OTHER): Payer: Self-pay | Admitting: Bariatrics

## 2020-03-26 VITALS — BP 125/82 | Temp 97.7°F | Ht 66.0 in | Wt 339.0 lb

## 2020-03-26 DIAGNOSIS — I110 Hypertensive heart disease with heart failure: Secondary | ICD-10-CM | POA: Diagnosis not present

## 2020-03-26 DIAGNOSIS — E1169 Type 2 diabetes mellitus with other specified complication: Secondary | ICD-10-CM

## 2020-03-26 DIAGNOSIS — E669 Obesity, unspecified: Secondary | ICD-10-CM | POA: Diagnosis not present

## 2020-03-26 DIAGNOSIS — Z6841 Body Mass Index (BMI) 40.0 and over, adult: Secondary | ICD-10-CM

## 2020-03-26 DIAGNOSIS — I5032 Chronic diastolic (congestive) heart failure: Secondary | ICD-10-CM | POA: Diagnosis not present

## 2020-03-31 NOTE — Progress Notes (Unsigned)
Chief Complaint:   OBESITY Stacey Page is here to discuss her progress with her obesity treatment plan along with follow-up of her obesity related diagnoses. Stacey Page is on the Category 2 Plan or the [place] and states she is following her eating plan approximately 75% of the time. Stacey Page states she is walking for 10 minutes 3 times per week.  Today's visit was #: 20 Starting weight: 344 lbs Starting date: 03/25/2019 Today's weight: 339 lbs Today's date: 03/26/2020 Total lbs lost to date: 5 Total lbs lost since last in-office visit: 1  Interim History: Stacey Page is down 1 lb since her last visit. She is drinking more water, and is doing well and eating more vegetables.   Subjective:   1. Type 2 diabetes mellitus with obesity ([place]) Stacey Page is taking Januvia, and she denies low blood sugars.  2. Hypertensive heart disease with chronic diastolic congestive heart failure ([place]) Stacey Page is taking Coreg, Lasix, Apresoline, and Zestril.  Assessment/Plan:   1. Type 2 diabetes mellitus with obesity (HCC) Good blood sugar control is important to decrease the likelihood of diabetic complications such as nephropathy, neuropathy, limb loss, blindness, coronary artery disease, and death. Intensive lifestyle modification including diet, exercise and weight loss are the first line of treatment for diabetes. Stacey Page will continue her medications, and no added sodium.   2. Hypertensive heart disease with chronic diastolic congestive heart failure ([place]) Stacey Page will continue Januvia, and will continue working on healthy weight loss and exercise to improve blood pressure control. We will watch for signs of hypotension as she continues her lifestyle modifications.  3. Class 3 severe obesity due to excess calories with serious comorbidity and body mass index (BMI) of 50.0 to 59.9 in adult Stacey Page) Stacey Page is currently in the action stage of change. As such, her goal is to continue  with weight loss efforts. She has agreed to the Category 2 Plan or the [place].   Stacey Page will adhere closely to the plan. [place] sheet was given today.  We will recheck fasting labs at her next visit.  Exercise goals: As is.  Behavioral modification strategies: increasing lean protein intake, decreasing simple carbohydrates, increasing vegetables, increasing water intake, decreasing eating out, no skipping meals, meal planning and cooking strategies, keeping healthy foods in the home, ways to avoid boredom eating, ways to avoid night time snacking, better snacking choices, emotional eating strategies and planning for success.  Stacey Page has agreed to follow-up with our clinic in 3 weeks. She was informed of the importance of frequent follow-up visits to maximize her success with intensive lifestyle modifications for her multiple health conditions.   Objective:   Blood pressure 125/82, temperature 97.7 F (36.5 C), height 5\' 6"  (1.676 m), weight (!) 339 lb (153.8 kg). Body mass index is 54.72 kg/m.  General: Cooperative, alert, well developed, in no acute distress. HEENT: Conjunctivae and lids unremarkable. Cardiovascular: Regular rhythm.  Lungs: Normal work of breathing. Neurologic: No focal deficits.   Lab Results  Component Value Date   CREATININE 1.54 (H) 10/28/2019   BUN 21 10/28/2019   NA 142 10/28/2019   K 4.7 10/28/2019   CL 102 10/28/2019   CO2 27 10/28/2019   Lab Results  Component Value Date   ALT 9 10/28/2019   AST 16 10/28/2019   ALKPHOS 73 10/28/2019   BILITOT 0.3 10/28/2019   Lab Results  Component Value Date   HGBA1C 6.0 (H) 10/28/2019   HGBA1C 5.9 (H) 07/04/2019   HGBA1C  6.0 (H) 03/25/2019   HGBA1C 6.1 11/28/2018   HGBA1C 6.2 01/16/2018   Lab Results  Component Value Date   INSULIN 8.6 10/28/2019   INSULIN 9.2 07/04/2019   INSULIN 8.8 03/25/2019   Lab Results  Component Value Date   TSH 0.51 11/28/2018   Lab Results   Component Value Date   CHOL 128 07/04/2019   HDL 61 07/04/2019   LDLCALC 53 07/04/2019   TRIG 66 07/04/2019   CHOLHDL 3 11/28/2018   Lab Results  Component Value Date   WBC 4.8 12/24/2018   HGB 12.7 12/24/2018   HCT 41.4 12/24/2018   MCV 95.8 12/24/2018   PLT 213 12/24/2018   No results found for: IRON, TIBC, FERRITIN  Obesity Behavioral Intervention:   Approximately 15 minutes were spent on the discussion below.  ASK: We discussed the diagnosis of obesity with Stacey Page today and Stacey Page agreed to give [name] permission to discuss obesity behavioral modification therapy today.  ASSESS: Stacey Page has the diagnosis of obesity and her BMI today is 54.74. Stacey Page is in the action stage of change.   ADVISE: Stacey Page was educated on the multiple health risks of obesity as well as the benefit of weight loss to improve her health. She was advised of the need for long term treatment and the importance of lifestyle modifications to improve her current health and to decrease her risk of future health problems.  AGREE: Multiple dietary modification options and treatment options were discussed and Stacey Page agreed to follow the recommendations documented in the above note.  ARRANGE: Stacey Page was educated on the importance of frequent visits to treat obesity as outlined per CMS and USPSTF guidelines and agreed to schedule her next follow up appointment today.  Attestation Statements:   Reviewed by clinician on day of visit: allergies, medications, problem list, medical history, surgical history, family history, social history, and previous encounter notes.   Wilhemena Durie, am acting as Location manager for [employer], DO.  I have reviewed the above documentation for accuracy and completeness, and I agree with the above. Jearld Lesch, DO

## 2020-04-01 ENCOUNTER — Encounter (INDEPENDENT_AMBULATORY_CARE_PROVIDER_SITE_OTHER): Payer: Self-pay | Admitting: Bariatrics

## 2020-04-08 ENCOUNTER — Encounter (INDEPENDENT_AMBULATORY_CARE_PROVIDER_SITE_OTHER): Payer: Self-pay | Admitting: Bariatrics

## 2020-04-08 ENCOUNTER — Other Ambulatory Visit: Payer: Self-pay

## 2020-04-08 ENCOUNTER — Ambulatory Visit (INDEPENDENT_AMBULATORY_CARE_PROVIDER_SITE_OTHER): Payer: Medicare Other | Admitting: Bariatrics

## 2020-04-08 VITALS — BP 139/72 | HR 82 | Temp 97.7°F | Ht 66.0 in | Wt 338.0 lb

## 2020-04-08 DIAGNOSIS — Z6841 Body Mass Index (BMI) 40.0 and over, adult: Secondary | ICD-10-CM

## 2020-04-08 DIAGNOSIS — E1169 Type 2 diabetes mellitus with other specified complication: Secondary | ICD-10-CM

## 2020-04-08 DIAGNOSIS — E669 Obesity, unspecified: Secondary | ICD-10-CM | POA: Diagnosis not present

## 2020-04-08 DIAGNOSIS — G4733 Obstructive sleep apnea (adult) (pediatric): Secondary | ICD-10-CM

## 2020-04-09 ENCOUNTER — Encounter (INDEPENDENT_AMBULATORY_CARE_PROVIDER_SITE_OTHER): Payer: Self-pay | Admitting: Bariatrics

## 2020-04-09 NOTE — Progress Notes (Signed)
Chief Complaint:   OBESITY Stacey Page is here to discuss her progress with her obesity treatment plan along with follow-up of her obesity related diagnoses. Stacey Page is on the Category 2 Plan and states she is following her eating plan approximately 80% of the time. Stacey Page states she is walking 20 minutes 5 times per week.  Today's visit was #: 21 Starting weight: 344 lbs Starting date: 03/25/2019 Today's weight: 338 lbs Today's date: 04/08/2020 Total lbs lost to date: 6 lbs Total lbs lost since last in-office visit: 1 lb  Interim History: Stacey Page is down 1 lb since her last visit.  Subjective:   1. Type 2 diabetes mellitus with obesity (Stacey Page) Stacey Page is taking Januvia.  Lab Results  Component Value Date   HGBA1C 6.0 (H) 10/28/2019   HGBA1C 5.9 (H) 07/04/2019   HGBA1C 6.0 (H) 03/25/2019   Lab Results  Component Value Date   MICROALBUR 1.9 01/16/2018   LDLCALC 53 07/04/2019   CREATININE 1.54 (H) 10/28/2019   Lab Results  Component Value Date   INSULIN 8.6 10/28/2019   INSULIN 9.2 07/04/2019   INSULIN 8.8 03/25/2019    2. OSA (obstructive sleep apnea)  Stacey Page has a diagnosis of sleep apnea.. She states that she has restful sleep.   Assessment/Plan:   1. Type 2 diabetes mellitus with obesity (HCC) Good blood sugar control is important to decrease the likelihood of diabetic complications such as nephropathy, neuropathy, limb loss, blindness, coronary artery disease, and death. Intensive lifestyle modification including diet, exercise and weight loss are the first line of treatment for diabetes. Continue current treatment plan.  2. OSA (obstructive sleep apnea)  She will continue to wear CPAP nightly Intensive lifestyle modifications are the first line treatment for this issue. We discussed several lifestyle modifications today and she will continue to work on diet, exercise and weight loss efforts. We will continue to monitor. Orders and follow up as  documented in patient record.   3. Class 3 severe obesity with serious comorbidity and body mass index (BMI) of 50.0 to 59.9 in adult, unspecified obesity type ([place]) Stacey Page is currently in the action stage of change. As such, her goal is to continue with weight loss efforts. She has agreed to the Category 2 Plan.   Exercise goals: As is  Behavioral modification strategies: increasing lean protein intake, decreasing simple carbohydrates, increasing vegetables, increasing water intake, decreasing eating out, no skipping meals, meal planning and cooking strategies, keeping healthy foods in the home and planning for success.  Stacey Page has agreed to follow-up with our clinic in 3 weeks. She was informed of the importance of frequent follow-up visits to maximize her success with intensive lifestyle modifications for her multiple health conditions.   Objective:   Blood pressure 139/72, pulse 82, temperature 97.7 F (36.5 C), height 5\' 6"  (1.676 m), weight (!) 338 lb (153.3 kg), SpO2 94 %. Body mass index is 54.55 kg/m.  General: Cooperative, alert, well developed, in no acute distress. HEENT: Conjunctivae and lids unremarkable. Cardiovascular: Regular rhythm.  Lungs: Normal work of breathing. Neurologic: No focal deficits.   Lab Results  Component Value Date   CREATININE 1.54 (H) 10/28/2019   BUN 21 10/28/2019   NA 142 10/28/2019   K 4.7 10/28/2019   CL 102 10/28/2019   CO2 27 10/28/2019   Lab Results  Component Value Date   ALT 9 10/28/2019   AST 16 10/28/2019   ALKPHOS 73 10/28/2019   BILITOT 0.3 10/28/2019   Lab  Results  Component Value Date   HGBA1C 6.0 (H) 10/28/2019   HGBA1C 5.9 (H) 07/04/2019   HGBA1C 6.0 (H) 03/25/2019   HGBA1C 6.1 11/28/2018   HGBA1C 6.2 01/16/2018   Lab Results  Component Value Date   INSULIN 8.6 10/28/2019   INSULIN 9.2 07/04/2019   INSULIN 8.8 03/25/2019   Lab Results  Component Value Date   TSH 0.51 11/28/2018   Lab Results   Component Value Date   CHOL 128 07/04/2019   HDL 61 07/04/2019   LDLCALC 53 07/04/2019   TRIG 66 07/04/2019   CHOLHDL 3 11/28/2018   Lab Results  Component Value Date   WBC 4.8 12/24/2018   HGB 12.7 12/24/2018   HCT 41.4 12/24/2018   MCV 95.8 12/24/2018   PLT 213 12/24/2018   No results found for: IRON, TIBC, FERRITIN  Obesity Behavioral Intervention:   Approximately 15 minutes were spent on the discussion below.  ASK: We discussed the diagnosis of obesity with Stacey Page today and Stacey Page agreed to give [name] permission to discuss obesity behavioral modification therapy today.  ASSESS: Stacey Page has the diagnosis of obesity and her BMI today is 54.6. Stacey Page is in the action stage of change.   ADVISE: Stacey Page was educated on the multiple health risks of obesity as well as the benefit of weight loss to improve her health. She was advised of the need for long term treatment and the importance of lifestyle modifications to improve her current health and to decrease her risk of future health problems.  AGREE: Multiple dietary modification options and treatment options were discussed and Stacey Page agreed to follow the recommendations documented in the above note.  ARRANGE: Stacey Page was educated on the importance of frequent visits to treat obesity as outlined per CMS and USPSTF guidelines and agreed to schedule her next follow up appointment today.  Attestation Statements:   Reviewed by clinician on day of visit: allergies, medications, problem list, medical history, surgical history, family history, social history, and previous encounter notes.  Coral Ceo, am acting as Location manager for [employer], DO.  I have reviewed the above documentation for accuracy and completeness, and I agree with the above. Jearld Lesch, DO

## 2020-04-16 DIAGNOSIS — I5032 Chronic diastolic (congestive) heart failure: Secondary | ICD-10-CM | POA: Diagnosis not present

## 2020-04-16 DIAGNOSIS — I129 Hypertensive chronic kidney disease with stage 1 through stage 4 chronic kidney disease, or unspecified chronic kidney disease: Secondary | ICD-10-CM | POA: Diagnosis not present

## 2020-04-16 DIAGNOSIS — N1832 Chronic kidney disease, stage 3b: Secondary | ICD-10-CM | POA: Diagnosis not present

## 2020-04-16 DIAGNOSIS — E1122 Type 2 diabetes mellitus with diabetic chronic kidney disease: Secondary | ICD-10-CM | POA: Diagnosis not present

## 2020-04-27 ENCOUNTER — Ambulatory Visit (INDEPENDENT_AMBULATORY_CARE_PROVIDER_SITE_OTHER): Payer: Medicare Other | Admitting: Bariatrics

## 2020-04-27 ENCOUNTER — Other Ambulatory Visit: Payer: Self-pay

## 2020-04-27 ENCOUNTER — Encounter (INDEPENDENT_AMBULATORY_CARE_PROVIDER_SITE_OTHER): Payer: Self-pay | Admitting: Bariatrics

## 2020-04-27 VITALS — BP 134/74 | HR 61 | Temp 98.1°F | Ht 66.0 in | Wt 340.0 lb

## 2020-04-27 DIAGNOSIS — E1169 Type 2 diabetes mellitus with other specified complication: Secondary | ICD-10-CM

## 2020-04-27 DIAGNOSIS — E785 Hyperlipidemia, unspecified: Secondary | ICD-10-CM | POA: Diagnosis not present

## 2020-04-27 DIAGNOSIS — Z6841 Body Mass Index (BMI) 40.0 and over, adult: Secondary | ICD-10-CM | POA: Diagnosis not present

## 2020-04-27 DIAGNOSIS — E1159 Type 2 diabetes mellitus with other circulatory complications: Secondary | ICD-10-CM

## 2020-04-27 DIAGNOSIS — I152 Hypertension secondary to endocrine disorders: Secondary | ICD-10-CM | POA: Diagnosis not present

## 2020-04-28 ENCOUNTER — Encounter (INDEPENDENT_AMBULATORY_CARE_PROVIDER_SITE_OTHER): Payer: Self-pay | Admitting: Bariatrics

## 2020-04-28 NOTE — Progress Notes (Signed)
Chief Complaint:   OBESITY Preslee is here to discuss her progress with her obesity treatment plan along with follow-up of her obesity related diagnoses. Krystyne is on the Category 2 Plan and states she is following her eating plan approximately 50% of the time. Hagar states she is walking 10 minutes 6 times per week.  Today's visit was #: 22 Starting weight: 344 lbs Starting date: 03/25/2019 Today's weight: 340 lbs Today's date: 04/27/2020 Total lbs lost to date: 4 lbs Total lbs lost since last in-office visit: 0  Interim History: Jameisha is up 2 lbs since her last visit.  Subjective:   1. Hypertension associated with type 2 diabetes mellitus ([place]) Pattie's BP is controlled.   BP Readings from Last 3 Encounters:  04/27/20 134/74  04/08/20 139/72  03/26/20 125/82    2. Hyperlipidemia associated with type 2 diabetes mellitus ([place]) Laure is taking Lipitor.  Lab Results  Component Value Date   ALT 9 10/28/2019   AST 16 10/28/2019   ALKPHOS 73 10/28/2019   BILITOT 0.3 10/28/2019   Lab Results  Component Value Date   CHOL 128 07/04/2019   HDL 61 07/04/2019   LDLCALC 53 07/04/2019   TRIG 66 07/04/2019   CHOLHDL 3 11/28/2018    Assessment/Plan:   1. Hypertension associated with type 2 diabetes mellitus ([place]) Janeece is working on healthy weight loss and exercise to improve blood pressure control. We will watch for signs of hypotension as she continues her lifestyle modifications. Continue current treatment plan.  2. Hyperlipidemia associated with type 2 diabetes mellitus ([place]) Cardiovascular risk and specific lipid/LDL goals reviewed.  We discussed several lifestyle modifications today and Anyelin will continue to work on diet, exercise and weight loss efforts. Orders and follow up as documented in patient record. Continue current treatment plan.  Counseling Intensive lifestyle modifications are the first line treatment for this  issue. . Dietary changes: Increase soluble fiber. Decrease simple carbohydrates. . Exercise changes: Moderate to vigorous-intensity aerobic activity 150 minutes per week if tolerated. . Lipid-lowering medications: see documented in medical record.  3. Class 3 severe obesity due to excess calories with serious comorbidity and body mass index (BMI) of 50.0 to 59.9 in adult [facility]) Sehaj is currently in the action stage of change. As such, her goal is to continue with weight loss efforts. She has agreed to the Category 2 Plan.   Exercise goals: As is  Behavioral modification strategies: increasing lean protein intake, decreasing simple carbohydrates, increasing vegetables, increasing water intake, decreasing eating out, no skipping meals, meal planning and cooking strategies, keeping healthy foods in the home and planning for success.  Shawn has agreed to follow-up with our clinic in 2-3 weeks. She was informed of the importance of frequent follow-up visits to maximize her success with intensive lifestyle modifications for her multiple health conditions.   Objective:   Blood pressure 134/74, pulse 61, temperature 98.1 F (36.7 C), height 5\' 6"  (1.676 m), weight (!) 340 lb (154.2 kg), SpO2 94 %. Body mass index is 54.88 kg/m.  General: Cooperative, alert, well developed, in no acute distress. HEENT: Conjunctivae and lids unremarkable. Cardiovascular: Regular rhythm.  Lungs: Normal work of breathing. Neurologic: No focal deficits.   Lab Results  Component Value Date   CREATININE 1.54 (H) 10/28/2019   BUN 21 10/28/2019   NA 142 10/28/2019   K 4.7 10/28/2019   CL 102 10/28/2019   CO2 27 10/28/2019   Lab Results  Component Value Date  ALT 9 10/28/2019   AST 16 10/28/2019   ALKPHOS 73 10/28/2019   BILITOT 0.3 10/28/2019   Lab Results  Component Value Date   HGBA1C 6.0 (H) 10/28/2019   HGBA1C 5.9 (H) 07/04/2019   HGBA1C 6.0 (H) 03/25/2019   HGBA1C 6.1 11/28/2018   HGBA1C  6.2 01/16/2018   Lab Results  Component Value Date   INSULIN 8.6 10/28/2019   INSULIN 9.2 07/04/2019   INSULIN 8.8 03/25/2019   Lab Results  Component Value Date   TSH 0.51 11/28/2018   Lab Results  Component Value Date   CHOL 128 07/04/2019   HDL 61 07/04/2019   LDLCALC 53 07/04/2019   TRIG 66 07/04/2019   CHOLHDL 3 11/28/2018   Lab Results  Component Value Date   WBC 4.8 12/24/2018   HGB 12.7 12/24/2018   HCT 41.4 12/24/2018   MCV 95.8 12/24/2018   PLT 213 12/24/2018   No results found for: IRON, TIBC, FERRITIN  Obesity Behavioral Intervention:   Approximately 15 minutes were spent on the discussion below.  ASK: We discussed the diagnosis of obesity with Allysson today and Christmas agreed to give [name] permission to discuss obesity behavioral modification therapy today.  ASSESS: Saige has the diagnosis of obesity and her BMI today is 55.0. Lenix is in the action stage of change.   ADVISE: Liani was educated on the multiple health risks of obesity as well as the benefit of weight loss to improve her health. She was advised of the need for long term treatment and the importance of lifestyle modifications to improve her current health and to decrease her risk of future health problems.  AGREE: Multiple dietary modification options and treatment options were discussed and Keyarra agreed to follow the recommendations documented in the above note.  ARRANGE: Camya was educated on the importance of frequent visits to treat obesity as outlined per CMS and USPSTF guidelines and agreed to schedule her next follow up appointment today.  Attestation Statements:   Reviewed by clinician on day of visit: allergies, medications, problem list, medical history, surgical history, family history, social history, and previous encounter notes.  Coral Ceo, am acting as Location manager for [employer], DO.  I have reviewed the above documentation for  accuracy and completeness, and I agree with the above. Jearld Lesch, DO

## 2020-05-14 ENCOUNTER — Telehealth: Payer: Self-pay | Admitting: Nurse Practitioner

## 2020-05-14 ENCOUNTER — Other Ambulatory Visit: Payer: Self-pay

## 2020-05-14 ENCOUNTER — Encounter: Payer: Self-pay | Admitting: Nurse Practitioner

## 2020-05-14 ENCOUNTER — Ambulatory Visit (INDEPENDENT_AMBULATORY_CARE_PROVIDER_SITE_OTHER): Payer: Medicare Other | Admitting: Nurse Practitioner

## 2020-05-14 VITALS — BP 116/70 | HR 60 | Temp 97.3°F | Ht 66.0 in | Wt 346.0 lb

## 2020-05-14 DIAGNOSIS — E1141 Type 2 diabetes mellitus with diabetic mononeuropathy: Secondary | ICD-10-CM

## 2020-05-14 DIAGNOSIS — I10 Essential (primary) hypertension: Secondary | ICD-10-CM | POA: Diagnosis not present

## 2020-05-14 DIAGNOSIS — G63 Polyneuropathy in diseases classified elsewhere: Secondary | ICD-10-CM

## 2020-05-14 DIAGNOSIS — Z0001 Encounter for general adult medical examination with abnormal findings: Secondary | ICD-10-CM | POA: Diagnosis not present

## 2020-05-14 DIAGNOSIS — I129 Hypertensive chronic kidney disease with stage 1 through stage 4 chronic kidney disease, or unspecified chronic kidney disease: Secondary | ICD-10-CM

## 2020-05-14 DIAGNOSIS — N184 Chronic kidney disease, stage 4 (severe): Secondary | ICD-10-CM | POA: Diagnosis not present

## 2020-05-14 DIAGNOSIS — I110 Hypertensive heart disease with heart failure: Secondary | ICD-10-CM

## 2020-05-14 DIAGNOSIS — E785 Hyperlipidemia, unspecified: Secondary | ICD-10-CM | POA: Diagnosis not present

## 2020-05-14 DIAGNOSIS — E1151 Type 2 diabetes mellitus with diabetic peripheral angiopathy without gangrene: Secondary | ICD-10-CM

## 2020-05-14 DIAGNOSIS — Z23 Encounter for immunization: Secondary | ICD-10-CM | POA: Diagnosis not present

## 2020-05-14 LAB — HEPATIC FUNCTION PANEL
ALT: 10 U/L (ref 0–35)
AST: 10 U/L (ref 0–37)
Albumin: 3.9 g/dL (ref 3.5–5.2)
Alkaline Phosphatase: 64 U/L (ref 39–117)
Bilirubin, Direct: 0.1 mg/dL (ref 0.0–0.3)
Total Bilirubin: 0.4 mg/dL (ref 0.2–1.2)
Total Protein: 7.3 g/dL (ref 6.0–8.3)

## 2020-05-14 LAB — LIPID PANEL
Cholesterol: 116 mg/dL (ref 0–200)
HDL: 51.6 mg/dL (ref >39.00)
LDL Cholesterol: 52 mg/dL (ref 0–99)
NonHDL: 64.41
Total CHOL/HDL Ratio: 2
Triglycerides: 61 mg/dL (ref 0.0–149.0)
VLDL: 12.2 mg/dL (ref 0.0–40.0)

## 2020-05-14 LAB — BASIC METABOLIC PANEL
BUN: 33 mg/dL — ABNORMAL HIGH (ref 6–23)
CO2: 30 mEq/L (ref 19–32)
Calcium: 9.3 mg/dL (ref 8.4–10.5)
Chloride: 103 mEq/L (ref 96–112)
Creatinine, Ser: 2.1 mg/dL — ABNORMAL HIGH (ref 0.40–1.20)
GFR: 23.77 mL/min — ABNORMAL LOW (ref >60.00)
Glucose, Bld: 100 mg/dL — ABNORMAL HIGH (ref 70–99)
Potassium: 4.3 mEq/L (ref 3.5–5.1)
Sodium: 138 mEq/L (ref 135–145)

## 2020-05-14 LAB — MICROALBUMIN / CREATININE URINE RATIO
Creatinine,U: 161.8 mg/dL
Microalb Creat Ratio: 0.8 mg/g (ref 0.0–30.0)
Microalb, Ur: 1.4 mg/dL (ref 0.0–1.9)

## 2020-05-14 LAB — CBC
HCT: 35 % — ABNORMAL LOW (ref 36.0–46.0)
Hemoglobin: 11.3 g/dL — ABNORMAL LOW (ref 12.0–15.0)
MCHC: 32.3 g/dL (ref 30.0–36.0)
MCV: 86.9 fl (ref 78.0–100.0)
Platelets: 204 10*3/uL (ref 150.0–400.0)
RBC: 4.03 Mil/uL (ref 3.87–5.11)
RDW: 16.4 % — ABNORMAL HIGH (ref 11.5–15.5)
WBC: 4.1 10*3/uL (ref 4.0–10.5)

## 2020-05-14 LAB — HEMOGLOBIN A1C: Hgb A1c MFr Bld: 6.1 % (ref 4.6–6.5)

## 2020-05-14 MED ORDER — HYDRALAZINE HCL 25 MG PO TABS: 25.0000 mg | ORAL_TABLET | Freq: Three times a day (TID) | ORAL | 3 refills | Status: DC

## 2020-05-14 NOTE — Telephone Encounter (Signed)
Pt called and requested it be mailed to her, I confirmed address

## 2020-05-14 NOTE — Patient Instructions (Addendum)
Schedule appt for annual eye exam and have report sent to me.  You will be contacted once letter from Dr. Loletha Grayer is ready.  Go to lab for blood draw.  Preventive Care 69 Years and Older, Female Preventive care refers to lifestyle choices and visits with your health care provider that can promote health and wellness. This includes:  A yearly physical exam. This is also called an annual wellness visit.  Regular dental and eye exams.  Immunizations.  Screening for certain conditions.  Healthy lifestyle choices, such as: ? Eating a healthy diet. ? Getting regular exercise. ? Not using drugs or products that contain nicotine and tobacco. ? Limiting alcohol use. What can I expect for my preventive care visit? Physical exam Your health care provider will check your:  Height and weight. These may be used to calculate your BMI (body mass index). BMI is a measurement that tells if you are at a healthy weight.  Heart rate and blood pressure.  Body temperature.  Skin for abnormal spots. Counseling Your health care provider may ask you questions about your:  Past medical problems.  Family's medical history.  Alcohol, tobacco, and drug use.  Emotional well-being.  Home life and relationship well-being.  Sexual activity.  Diet, exercise, and sleep habits.  History of falls.  Memory and ability to understand (cognition).  Work and work Statistician.  Pregnancy and menstrual history.  Access to firearms. What immunizations do I need? Vaccines are usually given at various ages, according to a schedule. Your health care provider will recommend vaccines for you based on your age, medical history, and lifestyle or other factors, such as travel or where you work.   What tests do I need? Blood tests  Lipid and cholesterol levels. These may be checked every 5 years, or more often depending on your overall health.  Hepatitis C test.  Hepatitis B test. Screening  Lung cancer  screening. You may have this screening every year starting at age 69 if you have a 30-pack-year history of smoking and currently smoke or have quit within the past 15 years.  Colorectal cancer screening. ? All adults should have this screening starting at age 69 and continuing until age 69. ? Your health care provider may recommend screening at age 31 if you are at increased risk. ? You will have tests every 1-10 years, depending on your results and the type of screening test.  Diabetes screening. ? This is done by checking your blood sugar (glucose) after you have not eaten for a while (fasting). ? You may have this done every 1-3 years.  Mammogram. ? This may be done every 1-2 years. ? Talk with your health care provider about how often you should have regular mammograms.  Abdominal aortic aneurysm (AAA) screening. You may need this if you are a current or former smoker.  BRCA-related cancer screening. This may be done if you have a family history of breast, ovarian, tubal, or peritoneal cancers. Other tests  STD (sexually transmitted disease) testing, if you are at risk.  Bone density scan. This is done to screen for osteoporosis. You may have this done starting at age 69. Talk with your health care provider about your test results, treatment options, and if necessary, the need for more tests. Follow these instructions at home: Eating and drinking  Eat a diet that includes fresh fruits and vegetables, whole grains, lean protein, and low-fat dairy products. Limit your intake of foods with high amounts of sugar, saturated  fats, and salt.  Take vitamin and mineral supplements as recommended by your health care provider.  Do not drink alcohol if your health care provider tells you not to drink.  If you drink alcohol: ? Limit how much you have to 0-1 drink a day. ? Be aware of how much alcohol is in your drink. In the [place] one drink equals one 12 oz bottle of beer (355 mL), one 5 oz  glass of wine (148 mL), or one 1 oz glass of hard liquor (44 mL).   Lifestyle  Take daily care of your teeth and gums. Brush your teeth every morning and night with fluoride toothpaste. Floss one time each day.  Stay active. Exercise for at least 30 minutes 5 or more days each week.  Do not use any products that contain nicotine or tobacco, such as cigarettes, e-cigarettes, and chewing tobacco. If you need help quitting, ask your health care provider.  Do not use drugs.  If you are sexually active, practice safe sex. Use a condom or other form of protection in order to prevent STIs (sexually transmitted infections).  Talk with your health care provider about taking a low-dose aspirin or statin.  Find healthy ways to cope with stress, such as: ? Meditation, yoga, or listening to music. ? Journaling. ? Talking to a trusted person. ? Spending time with friends and family. Safety  Always wear your seat belt while driving or riding in a vehicle.  Do not drive: ? If you have been drinking alcohol. Do not ride with someone who has been drinking. ? When you are tired or distracted. ? While texting.  Wear a helmet and other protective equipment during sports activities.  If you have firearms in your house, make sure you follow all gun safety procedures. What's next?  Visit your health care provider once a year for an annual wellness visit.  Ask your health care provider how often you should have your eyes and teeth checked.  Stay up to date on all vaccines. This information is not intended to replace advice given to you by your health care provider. Make sure you discuss any questions you have with your health care provider. Document Revised: 01/15/2020 Document Reviewed: 01/18/2018 Elsevier Patient Education  2021 [employer].

## 2020-05-14 NOTE — Telephone Encounter (Signed)
Letter done and given to CMA

## 2020-05-14 NOTE — Progress Notes (Signed)
Subjective:    Patient ID: Stacey Page, female    DOB: 03-Mar-1950, 70 y.o.   MRN: [id number]  Patient presents today for CPE and eval of chronic conditions  HPI CKD (chronic kidney disease) stage 4, GFR 15-29 ml/min (HCC) Stable labs except decrease in renal function. Decrease furosemide to 20mg , increase oral hydration with water. Return to lab in 1week for repeat renal function   Polyneuropathy associated with underlying disease ([place]) Stable Completed foot exam, no change Provided letter for another DM shoe  Type 2 diabetes mellitus with diabetic peripheral angiopathy without gangrene, without long-term current use of insulin ([place]) Controlled with januvia Normal urine microalbumin Will schedule dental and ophthalmology appt. Refill sent F/up in 78months  Essential hypertension BP at goal with lisinopril BP Readings from Last 3 Encounters:  05/14/20 116/70  04/27/20 134/74  04/08/20 139/72  followed by cardiology  Sexual History (orientation,birth control, marital status, STD):single, not sexually active, declined breast and pelvic exam today  Depression/Suicide: Depression screen [facility] 2/9 05/14/2020 07/31/2019 03/25/2019 02/28/2019 11/28/2018 11/28/2018 07/04/2018  Decreased Interest 0 0 2 0 0 0 0  Down, Depressed, Hopeless 0 0 2 0 0 0 0  PHQ - 2 Score 0 0 4 0 0 0 0  Altered sleeping 0 - 3 - 0 - -  Tired, decreased energy 1 - 2 - 0 - -  Change in appetite 0 - 3 - - - -  Feeling bad or failure about yourself  0 - 2 - 0 - -  Trouble concentrating 0 - 3 - 0 - -  Moving slowly or fidgety/restless 0 - 3 - 0 - -  Suicidal thoughts 0 - 0 - 0 - -  PHQ-9 Score 1 - 20 - 0 - -  Difficult doing work/chores Somewhat difficult - Somewhat difficult - - - -  Some recent data might be hidden   MMSE - Mini Mental State Exam 11/28/2018  Orientation to time 5  Orientation to Place 5  Registration 3  Attention/ Calculation 5  Recall 3  Language- name 2 objects 2  Language-  repeat 1  Language- follow 3 step command 3  Language- read & follow direction 1  Write a sentence 1  Copy design 1  Total score 30   Vision:will schedule  Dental:will schedule  Immunizations: (TDAP, Hep C screen, Pneumovax, Influenza, zoster)  Health Maintenance  Topic Date Due  . Eye exam for diabetics  11/01/2019  . Tetanus Vaccine  05/14/2021*  . Cologuard (Stool DNA test)  09/01/2020  . Flu Shot  09/07/2020  . COVID-19 Vaccine (4 - Booster for [place] series) 09/18/2020  . Hemoglobin A1C  11/13/2020  . Complete foot exam   05/14/2021  . Mammogram  06/20/2021  . DEXA scan (bone density measurement)  Completed  .  Hepatitis C: One time screening is recommended by Center for Disease Control  (CDC) for  adults born from [birth year] through 1964.   Completed  . Pneumonia vaccines  Completed  . HPV Vaccine  Aged Out  *Topic was postponed. The date shown is not the original due date.   Diet:low carb and low sodium Exercise: none Weight:  Wt Readings from Last 3 Encounters:  05/14/20 (!) 346 lb (156.9 kg)  04/27/20 (!) 340 lb (154.2 kg)  04/08/20 (!) 338 lb (153.3 kg)   Fall Risk: Fall Risk  05/14/2020 01/29/2020 07/31/2019 02/28/2019 11/28/2018 07/04/2018 03/27/2018  Falls in the past year? 0 0 0 0 0 0 0  Number falls in past yr: 0 0 0 - - - -  Injury with Fall? 0 0 0 - - - -  Risk for fall due to : No Fall Risks - No Fall Risks - - - -  Risk for fall due to: Comment - - - - - - -  Follow up Falls evaluation completed - Falls prevention discussed - - - -     Medications and allergies reviewed with patient and updated if appropriate.  Patient Active Problem List   Diagnosis Date Noted  . CKD (chronic kidney disease) stage 4, GFR 15-29 ml/min (HCC) 07/09/2019  . Diabetes mellitus type 2 in obese ([place]) 07/02/2019  . Polyneuropathy associated with underlying disease ([place]) 11/28/2018  . Severe episode of recurrent major depressive disorder, without psychotic features ([place]) 11/28/2018   . Type 2 diabetes mellitus with diabetic peripheral angiopathy without gangrene, without long-term current use of insulin ([place]) 11/28/2018  . Poor short term memory 11/28/2018  . Mild aortic stenosis by prior echocardiogram 11/06/2018  . Symptomatic anemia 03/17/2016  . S/P total knee arthroplasty, right 03/17/2016  . Unilateral primary osteoarthritis, right knee   . Hyperlipidemia LDL goal <70 12/15/2015  . Pain in both lower extremities 11/19/2015  . Numbness in both hands 08/13/2015  . Panic attack 06/09/2015  . Right knee pain 05/14/2015  . Chronic renal insufficiency 09/16/2014  . Pneumothorax on right   . Atypical mycobacterium infection   . ILD (interstitial lung disease) 2/2 MAI s/p med rxn   . Routine general medical examination at a health care facility 04/03/2014  . Morbid obesity ([place]) 04/03/2014  . OSA (obstructive sleep apnea) 02/25/2014  . Hypertensive heart disease with chronic diastolic congestive heart failure ([place]) 02/25/2014  . DOE (dyspnea on exertion) 02/25/2014  . Essential hypertension 02/25/2014    Current Outpatient Medications on File Prior to Visit  Medication Sig Dispense Refill  . acetaminophen (TYLENOL) 500 MG tablet Take 1 tablet (500 mg total) by mouth every 6 (six) hours as needed. 30 tablet 0  . albuterol (PROAIR HFA) 108 (90 Base) MCG/ACT inhaler Inhale 2 puffs into the lungs every 6 (six) hours as needed for wheezing or shortness of breath. 1 each 3  . aspirin 81 MG tablet Take 1 tablet (81 mg total) by mouth daily with breakfast. 90 tablet 0  . atorvastatin (LIPITOR) 20 MG tablet Take 1 tablet (20 mg total) by mouth daily at 6 PM. 90 tablet 3  . Calcium Carbonate-Vitamin D 600-400 MG-UNIT tablet Take 1 tablet by mouth daily.    . carvedilol (COREG) 25 MG tablet Take 1 tablet (25 mg total) by mouth 2 (two) times daily. 180 tablet 3  . Clobetasol Prop Emollient Base (CLOBETASOL PROPIONATE E) 0.05 % emollient cream Apply 1 application topically  daily as needed (skin irritations). 30 g 2  . furosemide (LASIX) 40 MG tablet TAKE 1 TAB DAILY, TAKE AN ADDITIONAL TAB AT 2 PM IF INCREASED SWELLING AND/OR SHORTNESS OF BREATH 180 tablet 2  . lisinopril (ZESTRIL) 20 MG tablet TAKE ONE TABLET BY MOUTH ONCE DAILY WITH BREAKFAST 90 tablet 3  . mometasone (NASONEX) 50 MCG/ACT nasal spray Place 2 sprays into the nose daily. 17 g 11  . montelukast (SINGULAIR) 10 MG tablet Take 1 tablet (10 mg total) by mouth at bedtime. 90 tablet 3  . sitaGLIPtin (JANUVIA) 100 MG tablet Take 1 tablet (100 mg total) by mouth daily. 90 tablet 1  . vitamin C (ASCORBIC ACID) 500 MG tablet  Take 500 mg by mouth daily.      No current facility-administered medications on file prior to visit.    Past Medical History:  Diagnosis Date  . Anxiety    doesn't take any meds  . Arthritis of right knee 03/14/2016  . Asthma   . Back pain   . Chronic diastolic CHF (congestive heart failure) (HCC)    HF with Preserved EF (60-65%) - Grade II Diastolic Dysfunction (Hypertensive Heart Disease). takes Furosemide daily  . COPD (chronic obstructive pulmonary disease) (HCC)    Albuterol as needed  . Depression    doesn't take meds  . Diabetes ([place])    takes Januvia daily  . Eczema    uses cream as needed  . GERD (gastroesophageal reflux disease)   . History of bronchitis as a child   . HTN (hypertension)   . Insomnia   . Mild aortic stenosis by prior echocardiogram 04/2017   Mild stenosis: Mean gradient 15 mmHg, peak gradient 28 mmHg  . OA (osteoarthritis)   . Obese   . OSA (obstructive sleep apnea) 02/25/2014   wears CPAP at night  . Peripheral neuropathy    takes Gabapentin as needed  . Pneumonia    hx of-2010  . Seasonal allergies    uses Flonase daily  . Sleep apnea   . SOB (shortness of breath)     Past Surgical History:  Procedure Laterality Date  . BREAST BIOPSY Right 03/2018  . BREAST LUMPECTOMY WITH RADIOACTIVE SEED LOCALIZATION Right 12/28/2018    Procedure: RIGHT BREAST LUMPECTOMY WITH RADIOACTIVE SEED LOCALIZATION;  Surgeon: Jovita Kussmaul, MD;  Location: [facility];  Service: General;  Laterality: Right;  . BREAST SURGERY    . CESAREAN SECTION  x2  . COLONOSCOPY    . CORONARY CALCIUM SCORE & CT ANGIOGRAM  09/2017   Coronary Ca Score = 11 (low).  CTA- no obstructive CAD (minimal disease)  . JOINT REPLACEMENT    . KNEE ARTHROSCOPY Right   . LUNG BIOPSY Right 06/10/2014   Procedure: LUNG BIOPSY;  Surgeon: Ivin Poot, MD;  Location: [facility];  Service: Thoracic;  Laterality: Right;  . POLYPECTOMY     throat  . TOTAL KNEE ARTHROPLASTY Right 03/14/2016   Procedure: RIGHT TOTAL KNEE ARTHROPLASTY;  Surgeon: Marybelle Killings, MD;  Location: [facility];  Service: Orthopedics;  Laterality: Right;  . TRANSTHORACIC ECHOCARDIOGRAM  11/04/2019   EF 60-65%.  Moderate LVH.  GRII DD.  Mild LV dilation.  Mild LA dilation.  Thickened-calcified aortic valve-Mild AS (mean gradient 13 mmHg, peak 29 mmHg--similar to 2019)  . VIDEO ASSISTED THORACOSCOPY Right 06/10/2014   Procedure: VIDEO ASSISTED THORACOSCOPY;  Surgeon: Ivin Poot, MD;  Location: [facility] OR;  Service: Thoracic;  Laterality: Right;    Social History   Socioeconomic History  . Marital status: Widowed    Spouse name: Not on file  . Number of children: Not on file  . Years of education: Not on file  . Highest education level: Not on file  Occupational History  . Occupation: retired  Tobacco Use  . Smoking status: Former Smoker    Packs/day: 0.25    Years: 15.00    Pack years: 3.75    Types: Cigarettes    Quit date: 02/08/1980    Years since quitting: 40.2  . Smokeless tobacco: Former Network engineer  . Vaping Use: Never used  Substance and Sexual Activity  . Alcohol use: Yes    Alcohol/week: 0.0  standard drinks    Comment: occassional/social/rare  . Drug use: Not Currently  . Sexual activity: Not Currently  Other Topics Concern  . Not on file  Social History Narrative  . Not on file    Social Determinants of Health   Financial Resource Strain: Low Risk   . Difficulty of Paying Living Expenses: Not hard at all  Food Insecurity: No Food Insecurity  . Worried About Charity fundraiser in the Last Year: Never true  . Ran Out of Food in the Last Year: Never true  Transportation Needs: No Transportation Needs  . Lack of Transportation (Medical): No  . Lack of Transportation (Non-Medical): No  Physical Activity: Inactive  . Days of Exercise per Week: 0 days  . Minutes of Exercise per Session: 0 min  Stress: No Stress Concern Present  . Feeling of Stress : Not at all  Social Connections: Socially Isolated  . Frequency of Communication with Friends and Family: More than three times a week  . Frequency of Social Gatherings with Friends and Family: Once a week  . Attends Religious Services: Never  . Active Member of Clubs or Organizations: No  . Attends Archivist Meetings: Never  . Marital Status: Widowed    Family History  Problem Relation Age of Onset  . COPD Father   . Hypothyroidism Father   . Anemia Father        iron deficiency  . High blood pressure Father   . Alcoholism Father   . Cancer Mother 90       pancreatic  . High blood pressure Mother   . High Cholesterol Mother   . Breast cancer Neg Hx         Review of Systems  Constitutional: Negative for fever, malaise/fatigue and weight loss.  HENT: Negative for congestion and sore throat.   Eyes:       Negative for visual changes  Respiratory: Positive for shortness of breath. Negative for cough.        Chronic SOB with exertion, no change  Cardiovascular: Negative for chest pain, palpitations and leg swelling.  Gastrointestinal: Negative for blood in stool, constipation, diarrhea and heartburn.  Genitourinary: Negative for dysuria, frequency and urgency.  Musculoskeletal: Negative for falls, joint pain and myalgias.  Skin: Negative for rash.  Neurological: Negative for dizziness,  sensory change and headaches.  Endo/Heme/Allergies: Does not bruise/bleed easily.  Psychiatric/Behavioral: Negative for depression, substance abuse and suicidal ideas. The patient is not nervous/anxious and does not have insomnia.    Objective:   Vitals:   05/14/20 0816  BP: 116/70  Pulse: 60  Temp: (!) 97.3 F (36.3 C)  SpO2: 96%   Body mass index is 55.85 kg/m.  Physical Examination:  Physical Exam Vitals reviewed.  Constitutional:      Appearance: She is obese.  HENT:     Right Ear: Tympanic membrane, ear canal and external ear normal.     Left Ear: Tympanic membrane, ear canal and external ear normal.  Eyes:     Extraocular Movements: Extraocular movements intact.     Conjunctiva/sclera: Conjunctivae normal.  Cardiovascular:     Rate and Rhythm: Normal rate and regular rhythm.     Pulses: Normal pulses.     Heart sounds: Normal heart sounds.  Pulmonary:     Effort: Pulmonary effort is normal.     Breath sounds: Normal breath sounds.  Abdominal:     General: Bowel sounds are normal.  Palpations: Abdomen is soft.  Musculoskeletal:     Cervical back: Normal range of motion and neck supple.     Right lower leg: Edema present.     Left lower leg: Edema present.  Lymphadenopathy:     Cervical: No cervical adenopathy.  Neurological:     Mental Status: She is alert and oriented to person, place, and time.  Psychiatric:        Mood and Affect: Mood normal.        Behavior: Behavior normal.        Thought Content: Thought content normal.    ASSESSMENT and PLAN: This visit occurred during the SARS-CoV-2 public health emergency.  Safety protocols were in place, including screening questions prior to the visit, additional usage of staff PPE, and extensive cleaning of exam room while observing appropriate contact time as indicated for disinfecting solutions.   Merve was seen today for annual exam.  Diagnoses and all orders for this visit:  Encounter for  preventative adult health care exam with abnormal findings -     CBC  Stage 4 chronic kidney disease ([place]) -     Basic metabolic panel -     Renal Function Panel; Future  Type 2 diabetes mellitus with diabetic peripheral angiopathy without gangrene, without long-term current use of insulin (HCC) -     Hemoglobin A1c -     Microalbumin / creatinine urine ratio -     Basic metabolic panel  Hyperlipidemia LDL goal <70 -     Lipid panel -     Hepatic function panel  Essential hypertension -     hydrALAZINE (APRESOLINE) 25 MG tablet; Take 1 tablet (25 mg total) by mouth 3 (three) times daily.  Need for pneumococcal vaccine -     Pneumococcal polysaccharide vaccine 23-valent greater than or equal to 2yo subcutaneous/IM  Polyneuropathy associated with underlying disease ([place])        Problem List Items Addressed This Visit      Cardiovascular and Mediastinum   Essential hypertension (Chronic)    BP at goal with lisinopril BP Readings from Last 3 Encounters:  05/14/20 116/70  04/27/20 134/74  04/08/20 139/72  followed by cardiology      Relevant Medications   hydrALAZINE (APRESOLINE) 25 MG tablet   Type 2 diabetes mellitus with diabetic peripheral angiopathy without gangrene, without long-term current use of insulin ([place])    Controlled with januvia Normal urine microalbumin Will schedule dental and ophthalmology appt. Refill sent F/up in 58months      Relevant Medications   hydrALAZINE (APRESOLINE) 25 MG tablet   Other Relevant Orders   Hemoglobin A1c (Completed)   Microalbumin / creatinine urine ratio (Completed)   Basic metabolic panel (Completed)     Nervous and Auditory   Polyneuropathy associated with underlying disease ([place])    Stable Completed foot exam, no change Provided letter for another DM shoe        Genitourinary   CKD (chronic kidney disease) stage 4, GFR 15-29 ml/min (HCC)    Stable labs except decrease in renal function. Decrease furosemide  to 20mg , increase oral hydration with water. Return to lab in 1week for repeat renal function         Other   Hyperlipidemia LDL goal <70 (Chronic)   Relevant Medications   hydrALAZINE (APRESOLINE) 25 MG tablet   Other Relevant Orders   Lipid panel (Completed)   Hepatic function panel (Completed)    Other Visit Diagnoses  Encounter for preventative adult health care exam with abnormal findings    -  Primary   Relevant Orders   CBC (Completed)   Need for pneumococcal vaccine       Relevant Orders   Pneumococcal polysaccharide vaccine 23-valent greater than or equal to 2yo subcutaneous/IM (Completed)      Follow up: Return in about 6 months (around 11/13/2020) for DM and HTN, hyperlipidemia (17mins).  Wilfred Lacy, NP

## 2020-05-14 NOTE — Telephone Encounter (Signed)
Letter put in the mail

## 2020-05-16 ENCOUNTER — Telehealth: Payer: Self-pay | Admitting: Nurse Practitioner

## 2020-05-16 ENCOUNTER — Encounter: Payer: Self-pay | Admitting: Nurse Practitioner

## 2020-05-16 MED ORDER — SITAGLIPTIN PHOSPHATE 25 MG PO TABS: 25.0000 mg | ORAL_TABLET | Freq: Every day | ORAL | 1 refills | Status: DC

## 2020-05-16 NOTE — Assessment & Plan Note (Signed)
Stable labs except decrease in renal function. Decrease furosemide to 20mg , increase oral hydration with water. Return to lab in 1week for repeat renal function

## 2020-05-16 NOTE — Assessment & Plan Note (Deleted)
BP at goal with lisinopril BP Readings from Last 3 Encounters:  05/14/20 116/70  04/27/20 134/74  04/08/20 139/72  followed by cardiology

## 2020-05-16 NOTE — Assessment & Plan Note (Signed)
LDL at goal with atorvastatin Refill sent Lipid Panel     Component Value Date/Time   CHOL 116 05/14/2020 0910   CHOL 128 07/04/2019 1240   TRIG 61.0 05/14/2020 0910   HDL 51.60 05/14/2020 0910   HDL 61 07/04/2019 1240   CHOLHDL 2 05/14/2020 0910   VLDL 12.2 05/14/2020 0910   LDLCALC 52 05/14/2020 0910   LDLCALC 53 07/04/2019 1240   LABVLDL 14 07/04/2019 1240

## 2020-05-16 NOTE — Telephone Encounter (Signed)
error 

## 2020-05-16 NOTE — Assessment & Plan Note (Signed)
Stable Completed foot exam, no change Provided letter for another DM shoe

## 2020-05-16 NOTE — Assessment & Plan Note (Signed)
BP at goal with lisinopril BP Readings from Last 3 Encounters:  05/14/20 116/70  04/27/20 134/74  04/08/20 139/72  followed by cardiology

## 2020-05-16 NOTE — Assessment & Plan Note (Addendum)
Controlled with januvia, but decreased dose to 25mg  due to decline in renal function. Normal urine microalbumin Will schedule dental and ophthalmology appt. Refill sent F/up in 65months

## 2020-05-18 ENCOUNTER — Other Ambulatory Visit: Payer: Self-pay

## 2020-05-18 ENCOUNTER — Ambulatory Visit (INDEPENDENT_AMBULATORY_CARE_PROVIDER_SITE_OTHER): Payer: Medicare Other | Admitting: Bariatrics

## 2020-05-18 ENCOUNTER — Encounter (INDEPENDENT_AMBULATORY_CARE_PROVIDER_SITE_OTHER): Payer: Self-pay | Admitting: Bariatrics

## 2020-05-18 VITALS — BP 132/84 | HR 61 | Temp 98.0°F | Ht 66.0 in | Wt 343.0 lb

## 2020-05-18 DIAGNOSIS — E1169 Type 2 diabetes mellitus with other specified complication: Secondary | ICD-10-CM

## 2020-05-18 DIAGNOSIS — E785 Hyperlipidemia, unspecified: Secondary | ICD-10-CM | POA: Diagnosis not present

## 2020-05-18 DIAGNOSIS — E66813 Obesity, class 3: Secondary | ICD-10-CM

## 2020-05-18 DIAGNOSIS — Z6841 Body Mass Index (BMI) 40.0 and over, adult: Secondary | ICD-10-CM

## 2020-05-18 DIAGNOSIS — N184 Chronic kidney disease, stage 4 (severe): Secondary | ICD-10-CM | POA: Diagnosis not present

## 2020-05-20 ENCOUNTER — Encounter (INDEPENDENT_AMBULATORY_CARE_PROVIDER_SITE_OTHER): Payer: Self-pay | Admitting: Bariatrics

## 2020-05-20 NOTE — Progress Notes (Signed)
Chief Complaint:   OBESITY Stacey Page is here to discuss her progress with her obesity treatment plan along with follow-up of her obesity related diagnoses. Stacey Page is on the Category 2 Plan and states she is following her eating plan approximately 0% of the time. Stacey Page states she is not exercising at this time.  Today's visit was #: 23 Starting weight: 344 lbs Starting date: 03/25/2019 Today's weight: 343 lbs Today's date: 05/18/2020 Total lbs lost to date: 1 lb Total lbs lost since last in-office visit: 0  Interim History: Stacey Page is up 3 pounds since her last visit.  Subjective:   1. Type 2 diabetes mellitus with other specified complication, without long-term current use of insulin ([place]) She is taking Januvia.  Lab Results  Component Value Date   HGBA1C 6.1 05/14/2020   HGBA1C 6.0 (H) 10/28/2019   HGBA1C 5.9 (H) 07/04/2019   Lab Results  Component Value Date   MICROALBUR 1.4 05/14/2020   LDLCALC 52 05/14/2020   CREATININE 2.10 (H) 05/14/2020   Lab Results  Component Value Date   INSULIN 8.6 10/28/2019   INSULIN 9.2 07/04/2019   INSULIN 8.8 03/25/2019   2. Hyperlipidemia LDL goal <70 Stacey Page has hyperlipidemia and has been trying to improve her cholesterol levels with intensive lifestyle modification including a low saturated fat diet, exercise and weight loss. She denies any chest pain, claudication or myalgias.  Taking Lipitor.  Lab Results  Component Value Date   ALT 10 05/14/2020   AST 10 05/14/2020   ALKPHOS 64 05/14/2020   BILITOT 0.4 05/14/2020   Lab Results  Component Value Date   CHOL 116 05/14/2020   HDL 51.60 05/14/2020   LDLCALC 52 05/14/2020   TRIG 61.0 05/14/2020   CHOLHDL 2 05/14/2020   3. Stage 4 chronic kidney disease (HCC) Low GFR at 23.77.  She was told to decrease her Lasix and increase her water intake.  Lab Results  Component Value Date   CREATININE 2.10 (H) 05/14/2020   CREATININE 1.54 (H) 10/28/2019   CREATININE  2.08 (H) 07/04/2019   Lab Results  Component Value Date   CREATININE 2.10 (H) 05/14/2020   BUN 33 (H) 05/14/2020   NA 138 05/14/2020   K 4.3 05/14/2020   CL 103 05/14/2020   CO2 30 05/14/2020   Assessment/Plan:   1. Type 2 diabetes mellitus with other specified complication, without long-term current use of insulin (HCC) Good blood sugar control is important to decrease the likelihood of diabetic complications such as nephropathy, neuropathy, limb loss, blindness, coronary artery disease, and death. Intensive lifestyle modification including diet, exercise and weight loss are the first line of treatment for diabetes.  Continue medications.  2. Hyperlipidemia LDL goal <70 Cardiovascular risk and specific lipid/LDL goals reviewed.  We discussed several lifestyle modifications today and Stacey Page will continue to work on diet, exercise and weight loss efforts. Orders and follow up as documented in patient record.  Continue Lipitor.  Counseling Intensive lifestyle modifications are the first line treatment for this issue. . Dietary changes: Increase soluble fiber. Decrease simple carbohydrates. . Exercise changes: Moderate to vigorous-intensity aerobic activity 150 minutes per week if tolerated. . Lipid-lowering medications: see documented in medical record.  3. Stage 4 chronic kidney disease ([place]) Lab results and trends reviewed. We discussed several lifestyle modifications today and she will continue to work on diet, exercise and weight loss efforts. Avoid nephrotoxic medications. Orders and follow up as documented in patient record.  Follow-up with Nephrology (appointment this  week).  Decrease protein to 60 grams per day.  Counseling . Chronic kidney disease (CKD) happens when the kidneys are damaged over a long period of time. . Most of the time, this condition does not go away, but it can usually be controlled. Steps must be taken to slow down the kidney damage or to stop it from  getting worse. . Intensive lifestyle modifications are the first line treatment for this issue.  Marland Kitchen Avoid buying foods that are: processed, frozen, or prepackaged to avoid excess salt.  4. Obesity, current BMI 55  Stacey Page is currently in the action stage of change. As such, her goal is to continue with weight loss efforts. She has agreed to the Category 2 Plan.   She will work on meal planning and mindful eating.  Labs from 05/14/2020, including CMP, lipids, CBC, A1c, and microalbumin, were reviewed today.  Exercise goals: Older adults should follow the adult guidelines. When older adults cannot meet the adult guidelines, they should be as physically active as their abilities and conditions will allow.  Older adults should do exercises that maintain or improve balance if they are at risk of falling.   Behavioral modification strategies: increasing lean protein intake, decreasing simple carbohydrates, increasing vegetables, increasing water intake, decreasing eating out, no skipping meals, meal planning and cooking strategies, keeping healthy foods in the home and planning for success.  Stacey Page has agreed to follow-up with our clinic in 2-3 weeks. She was informed of the importance of frequent follow-up visits to maximize her success with intensive lifestyle modifications for her multiple health conditions.   Objective:   Blood pressure 132/84, pulse 61, temperature 98 F (36.7 C), height 5\' 6"  (1.676 m), weight (!) 343 lb (155.6 kg), SpO2 94 %. Body mass index is 55.36 kg/m.  General: Cooperative, alert, well developed, in no acute distress. HEENT: Conjunctivae and lids unremarkable. Cardiovascular: Regular rhythm.  Lungs: Normal work of breathing. Neurologic: No focal deficits.   Lab Results  Component Value Date   CREATININE 2.10 (H) 05/14/2020   BUN 33 (H) 05/14/2020   NA 138 05/14/2020   K 4.3 05/14/2020   CL 103 05/14/2020   CO2 30 05/14/2020   Lab Results  Component  Value Date   ALT 10 05/14/2020   AST 10 05/14/2020   ALKPHOS 64 05/14/2020   BILITOT 0.4 05/14/2020   Lab Results  Component Value Date   HGBA1C 6.1 05/14/2020   HGBA1C 6.0 (H) 10/28/2019   HGBA1C 5.9 (H) 07/04/2019   HGBA1C 6.0 (H) 03/25/2019   HGBA1C 6.1 11/28/2018   Lab Results  Component Value Date   INSULIN 8.6 10/28/2019   INSULIN 9.2 07/04/2019   INSULIN 8.8 03/25/2019   Lab Results  Component Value Date   TSH 0.51 11/28/2018   Lab Results  Component Value Date   CHOL 116 05/14/2020   HDL 51.60 05/14/2020   LDLCALC 52 05/14/2020   TRIG 61.0 05/14/2020   CHOLHDL 2 05/14/2020   Lab Results  Component Value Date   WBC 4.1 05/14/2020   HGB 11.3 (L) 05/14/2020   HCT 35.0 (L) 05/14/2020   MCV 86.9 05/14/2020   PLT 204.0 05/14/2020   Obesity Behavioral Intervention:   Approximately 15 minutes were spent on the discussion below.  ASK: We discussed the diagnosis of obesity with Stacey Page today and Stacey Page agreed to give [name] permission to discuss obesity behavioral modification therapy today.  ASSESS: Stacey Page has the diagnosis of obesity and her BMI today is 55.4. Stacey Page  is in the action stage of change.   ADVISE: Stacey Page was educated on the multiple health risks of obesity as well as the benefit of weight loss to improve her health. She was advised of the need for long term treatment and the importance of lifestyle modifications to improve her current health and to decrease her risk of future health problems.  AGREE: Multiple dietary modification options and treatment options were discussed and Stacey Page agreed to follow the recommendations documented in the above note.  ARRANGE: Stacey Page was educated on the importance of frequent visits to treat obesity as outlined per CMS and USPSTF guidelines and agreed to schedule her next follow up appointment today.  Attestation Statements:   Reviewed by clinician on day of visit: allergies, medications,  problem list, medical history, surgical history, family history, social history, and previous encounter notes.  I, Water quality scientist, CMA, am acting as Location manager for [employer], DO  I have reviewed the above documentation for accuracy and completeness, and I agree with the above. Jearld Lesch, DO

## 2020-05-21 ENCOUNTER — Other Ambulatory Visit (INDEPENDENT_AMBULATORY_CARE_PROVIDER_SITE_OTHER): Payer: Medicare Other

## 2020-05-21 ENCOUNTER — Other Ambulatory Visit: Payer: Self-pay

## 2020-05-21 DIAGNOSIS — N184 Chronic kidney disease, stage 4 (severe): Secondary | ICD-10-CM

## 2020-05-21 DIAGNOSIS — G63 Polyneuropathy in diseases classified elsewhere: Secondary | ICD-10-CM

## 2020-05-21 LAB — RENAL FUNCTION PANEL
Albumin: 3.8 g/dL (ref 3.5–5.2)
BUN: 23 mg/dL (ref 6–23)
CO2: 27 mEq/L (ref 19–32)
Calcium: 9.3 mg/dL (ref 8.4–10.5)
Chloride: 101 mEq/L (ref 96–112)
Creatinine, Ser: 1.75 mg/dL — ABNORMAL HIGH (ref 0.40–1.20)
GFR: 29.58 mL/min — ABNORMAL LOW (ref >60.00)
Glucose, Bld: 97 mg/dL (ref 70–99)
Phosphorus: 3.4 mg/dL (ref 2.3–4.6)
Potassium: 4.2 mEq/L (ref 3.5–5.1)
Sodium: 136 mEq/L (ref 135–145)

## 2020-05-25 ENCOUNTER — Other Ambulatory Visit: Payer: Self-pay | Admitting: Nurse Practitioner

## 2020-05-25 DIAGNOSIS — Z1231 Encounter for screening mammogram for malignant neoplasm of breast: Secondary | ICD-10-CM

## 2020-05-26 MED ORDER — GABAPENTIN 100 MG PO CAPS: 100.0000 mg | ORAL_CAPSULE | Freq: Two times a day (BID) | ORAL | 1 refills | Status: DC

## 2020-05-26 NOTE — Addendum Note (Signed)
Addended by: Wilfred Lacy L on: 05/26/2020 04:25 PM   Modules accepted: Orders

## 2020-05-26 NOTE — Progress Notes (Signed)
rx sent

## 2020-06-03 ENCOUNTER — Other Ambulatory Visit: Payer: Self-pay

## 2020-06-03 ENCOUNTER — Other Ambulatory Visit: Payer: Medicare Other

## 2020-06-03 ENCOUNTER — Encounter: Payer: Self-pay | Admitting: Podiatry

## 2020-06-03 ENCOUNTER — Ambulatory Visit: Payer: Medicare Other | Admitting: Podiatry

## 2020-06-03 DIAGNOSIS — M2041 Other hammer toe(s) (acquired), right foot: Secondary | ICD-10-CM

## 2020-06-03 DIAGNOSIS — E1142 Type 2 diabetes mellitus with diabetic polyneuropathy: Secondary | ICD-10-CM

## 2020-06-03 DIAGNOSIS — M2042 Other hammer toe(s) (acquired), left foot: Secondary | ICD-10-CM | POA: Diagnosis not present

## 2020-06-04 NOTE — Progress Notes (Signed)
Subjective:   Patient ID: Stacey Page, female   DOB: 70 y.o.   MRN: [id number]   HPI Patient presents stating I wanted my feet checked I need new diabetic shoes   ROS      Objective:  Physical Exam  Vascular status intact moderate diminishment neurological Sharp dull vibratory and DTR reflexes.  Patient's been long-term diabetic moderate sleep obese and has digital deformities bilateral with history of diabetic shoes with recommendation from family physician     Assessment:  Chronic structural issues long-term diabetes at risk condition 8     Plan:  NP reviewed recommended diabetic shoes and explained utilization and also diabetic foot education rendered today with daily inspections recommended.  Patient will be casted for diabetic shoes to accommodate deformity

## 2020-06-10 ENCOUNTER — Encounter (INDEPENDENT_AMBULATORY_CARE_PROVIDER_SITE_OTHER): Payer: Self-pay | Admitting: Bariatrics

## 2020-06-10 ENCOUNTER — Ambulatory Visit (INDEPENDENT_AMBULATORY_CARE_PROVIDER_SITE_OTHER): Payer: Medicare Other | Admitting: Bariatrics

## 2020-06-10 ENCOUNTER — Other Ambulatory Visit: Payer: Self-pay

## 2020-06-10 VITALS — BP 112/65 | HR 60 | Temp 97.9°F | Ht 66.0 in | Wt 342.0 lb

## 2020-06-10 DIAGNOSIS — E1169 Type 2 diabetes mellitus with other specified complication: Secondary | ICD-10-CM | POA: Diagnosis not present

## 2020-06-10 DIAGNOSIS — Z6841 Body Mass Index (BMI) 40.0 and over, adult: Secondary | ICD-10-CM

## 2020-06-10 DIAGNOSIS — E669 Obesity, unspecified: Secondary | ICD-10-CM

## 2020-06-10 DIAGNOSIS — E785 Hyperlipidemia, unspecified: Secondary | ICD-10-CM

## 2020-06-10 MED ORDER — OZEMPIC (0.25 OR 0.5 MG/DOSE) 2 MG/1.5ML ~~LOC~~ SOPN: 0.2500 mg | PEN_INJECTOR | SUBCUTANEOUS | 0 refills | Status: DC

## 2020-06-11 NOTE — Progress Notes (Signed)
Chief Complaint:   OBESITY Mertie is here to discuss her progress with her obesity treatment plan along with follow-up of her obesity related diagnoses. Pete is on the Category 2 Plan and states she is following her eating plan approximately 0% of the time. Jimia states she is not currently exercising.  Today's visit was #: 24 Starting weight: 344 lbs Starting date: 03/25/2019 Today's weight: 342 lbs Today's date: 06/10/2020 Total lbs lost to date: 2 Total lbs lost since last in-office visit: 1  Interim History: Dezirae is down 1 lb since her last visit.  Subjective:   1. Diabetes mellitus type 2 in obese (HCC) Carola is taking Januvia.   Lab Results  Component Value Date   HGBA1C 6.1 05/14/2020   HGBA1C 6.0 (H) 10/28/2019   HGBA1C 5.9 (H) 07/04/2019   Lab Results  Component Value Date   MICROALBUR 1.4 05/14/2020   LDLCALC 52 05/14/2020   CREATININE 1.75 (H) 05/21/2020   Lab Results  Component Value Date   INSULIN 8.6 10/28/2019   INSULIN 9.2 07/04/2019   INSULIN 8.8 03/25/2019    2. Hyperlipidemia LDL goal <70 Kori is taking Lipitor.   Lab Results  Component Value Date   ALT 10 05/14/2020   AST 10 05/14/2020   ALKPHOS 64 05/14/2020   BILITOT 0.4 05/14/2020   Lab Results  Component Value Date   CHOL 116 05/14/2020   HDL 51.60 05/14/2020   LDLCALC 52 05/14/2020   TRIG 61.0 05/14/2020   CHOLHDL 2 05/14/2020    Assessment/Plan:   1. Diabetes mellitus type 2 in obese (HCC) Good blood sugar control is important to decrease the likelihood of diabetic complications such as nephropathy, neuropathy, limb loss, blindness, coronary artery disease, and death. Intensive lifestyle modification including diet, exercise and weight loss are the first line of treatment for diabetes. Continue medications. Start Ozempic 0.25 mg, as prescribed below.  - Semaglutide,0.25 or 0.5MG /DOS, (OZEMPIC, 0.25 OR 0.5 MG/DOSE,) 2 MG/1.5ML SOPN; Inject 0.25 mg  into the skin once a week.  Dispense: 1.5 mL; Refill: 0  2. Hyperlipidemia LDL goal <70 Cardiovascular risk and specific lipid/LDL goals reviewed.  We discussed several lifestyle modifications today and Winslow will continue to work on diet, exercise and weight loss efforts. Orders and follow up as documented in patient record. Continue medications.  Counseling Intensive lifestyle modifications are the first line treatment for this issue. . Dietary changes: Increase soluble fiber. Decrease simple carbohydrates. . Exercise changes: Moderate to vigorous-intensity aerobic activity 150 minutes per week if tolerated. . Lipid-lowering medications: see documented in medical record.  3. Obesity, current BMI 55  Gordie is currently in the action stage of change. As such, her goal is to continue with weight loss efforts. She has agreed to the Category 2 Plan.   Meal plan Decrease carbs and increase protein  Exercise goals: As is  Behavioral modification strategies: increasing lean protein intake, decreasing simple carbohydrates, increasing vegetables, increasing water intake, decreasing liquid calories, ways to avoid night time snacking, better snacking choices, emotional eating strategies and planning for success.  Amayia has agreed to follow-up with our clinic in 2-3 weeks. She was informed of the importance of frequent follow-up visits to maximize her success with intensive lifestyle modifications for her multiple health conditions.   Objective:   Blood pressure 112/65, pulse 60, temperature 97.9 F (36.6 C), height 5\' 6"  (1.676 m), weight (!) 342 lb (155.1 kg), SpO2 93 %. Body mass index is 55.2 kg/m.  General:  Cooperative, alert, well developed, in no acute distress. HEENT: Conjunctivae and lids unremarkable. Cardiovascular: Regular rhythm.  Lungs: Normal work of breathing. Neurologic: No focal deficits.   Lab Results  Component Value Date   CREATININE 1.75 (H) 05/21/2020    BUN 23 05/21/2020   NA 136 05/21/2020   K 4.2 05/21/2020   CL 101 05/21/2020   CO2 27 05/21/2020   Lab Results  Component Value Date   ALT 10 05/14/2020   AST 10 05/14/2020   ALKPHOS 64 05/14/2020   BILITOT 0.4 05/14/2020   Lab Results  Component Value Date   HGBA1C 6.1 05/14/2020   HGBA1C 6.0 (H) 10/28/2019   HGBA1C 5.9 (H) 07/04/2019   HGBA1C 6.0 (H) 03/25/2019   HGBA1C 6.1 11/28/2018   Lab Results  Component Value Date   INSULIN 8.6 10/28/2019   INSULIN 9.2 07/04/2019   INSULIN 8.8 03/25/2019   Lab Results  Component Value Date   TSH 0.51 11/28/2018   Lab Results  Component Value Date   CHOL 116 05/14/2020   HDL 51.60 05/14/2020   LDLCALC 52 05/14/2020   TRIG 61.0 05/14/2020   CHOLHDL 2 05/14/2020   Lab Results  Component Value Date   WBC 4.1 05/14/2020   HGB 11.3 (L) 05/14/2020   HCT 35.0 (L) 05/14/2020   MCV 86.9 05/14/2020   PLT 204.0 05/14/2020   No results found for: IRON, TIBC, FERRITIN  Obesity Behavioral Intervention:   Approximately 15 minutes were spent on the discussion below.  ASK: We discussed the diagnosis of obesity with Reizel today and Verity agreed to give [name] permission to discuss obesity behavioral modification therapy today.  ASSESS: Gwendlyon has the diagnosis of obesity and her BMI today is 55.2. Milanna is in the action stage of change.   ADVISE: Taurus was educated on the multiple health risks of obesity as well as the benefit of weight loss to improve her health. She was advised of the need for long term treatment and the importance of lifestyle modifications to improve her current health and to decrease her risk of future health problems.  AGREE: Multiple dietary modification options and treatment options were discussed and Citlaly agreed to follow the recommendations documented in the above note.  ARRANGE: Ariyon was educated on the importance of frequent visits to treat obesity as outlined per CMS and  USPSTF guidelines and agreed to schedule her next follow up appointment today.  Attestation Statements:   Reviewed by clinician on day of visit: allergies, medications, problem list, medical history, surgical history, family history, social history, and previous encounter notes.  Coral Ceo, CMA, am acting as Location manager for [employer], DO.  I have reviewed the above documentation for accuracy and completeness, and I agree with the above. Jearld Lesch, DO

## 2020-06-16 ENCOUNTER — Other Ambulatory Visit (INDEPENDENT_AMBULATORY_CARE_PROVIDER_SITE_OTHER): Payer: Self-pay | Admitting: Bariatrics

## 2020-06-16 ENCOUNTER — Encounter (INDEPENDENT_AMBULATORY_CARE_PROVIDER_SITE_OTHER): Payer: Self-pay | Admitting: Bariatrics

## 2020-06-16 DIAGNOSIS — E669 Obesity, unspecified: Secondary | ICD-10-CM

## 2020-06-16 DIAGNOSIS — E1169 Type 2 diabetes mellitus with other specified complication: Secondary | ICD-10-CM

## 2020-07-01 ENCOUNTER — Other Ambulatory Visit: Payer: Self-pay

## 2020-07-01 ENCOUNTER — Encounter (INDEPENDENT_AMBULATORY_CARE_PROVIDER_SITE_OTHER): Payer: Self-pay | Admitting: Bariatrics

## 2020-07-01 ENCOUNTER — Ambulatory Visit (INDEPENDENT_AMBULATORY_CARE_PROVIDER_SITE_OTHER): Payer: Medicare Other | Admitting: Bariatrics

## 2020-07-01 VITALS — BP 130/82 | HR 65 | Temp 98.1°F | Ht 66.0 in | Wt 346.0 lb

## 2020-07-01 DIAGNOSIS — Z6841 Body Mass Index (BMI) 40.0 and over, adult: Secondary | ICD-10-CM

## 2020-07-01 DIAGNOSIS — E1169 Type 2 diabetes mellitus with other specified complication: Secondary | ICD-10-CM

## 2020-07-01 DIAGNOSIS — E7849 Other hyperlipidemia: Secondary | ICD-10-CM

## 2020-07-01 DIAGNOSIS — R609 Edema, unspecified: Secondary | ICD-10-CM | POA: Diagnosis not present

## 2020-07-01 DIAGNOSIS — E669 Obesity, unspecified: Secondary | ICD-10-CM | POA: Diagnosis not present

## 2020-07-07 ENCOUNTER — Encounter (INDEPENDENT_AMBULATORY_CARE_PROVIDER_SITE_OTHER): Payer: Self-pay | Admitting: Bariatrics

## 2020-07-07 NOTE — Progress Notes (Signed)
Chief Complaint:   OBESITY Stacey Page is here to discuss her progress with her obesity treatment plan along with follow-up of her obesity related diagnoses. Stacey Page is on the Category 3 Plan and states she is following her eating plan approximately 5% of the time. Stacey Page states she is walking more.  Today's visit was #: 25 Starting weight: 344 lbs Starting date: 03/25/2019 Today's weight: 346 lbs Today's date: 07/01/2020 Total lbs lost to date: 0 Total lbs lost since last in-office visit: 0  Interim History: Stacey Page is up 4 lbs since her last visit. She thinks that she is retaining water 3 times a week.  Subjective:   1. Diabetes mellitus type 2 in obese (Stacey Page) Stacey Page was started on Ozempic but is unable to take it due to expense.   2. Other hyperlipidemia Stacey Page is taking Lipitor.  3. Fluid retention Stacey Page is taking Lasix 1/25 tab M/W/F.  Assessment/Plan:   1. Diabetes mellitus type 2 in obese (HCC) Good blood sugar control is important to decrease the likelihood of diabetic complications such as nephropathy, neuropathy, limb loss, blindness, coronary artery disease, and death. Intensive lifestyle modification including diet, exercise and weight loss are the first line of treatment for diabetes.  -Will check on coverage for Victoza and Trulicity.  2. Other hyperlipidemia Cardiovascular risk and specific lipid/LDL goals reviewed.  We discussed several lifestyle modifications today and Idell will continue to work on diet, exercise and weight loss efforts. Orders and follow up as documented in patient record. Continue current treatment plan.  Counseling Intensive lifestyle modifications are the first line treatment for this issue. . Dietary changes: Increase soluble fiber. Decrease simple carbohydrates. . Exercise changes: Moderate to vigorous-intensity aerobic activity 150 minutes per week if tolerated. . Lipid-lowering medications: see documented in  medical record.  3. Fluid retention Increase Lasix to 1 tablet M/W/F. If increase does not resolve water retention, pt will call PCP.  4. Obesity, current BMI 55 Stacey Page is currently in the action stage of change. As such, her goal is to continue with weight loss efforts. She has agreed to the Category 3 Plan.   Meal plan Intentional eating  Exercise goals: As is  Behavioral modification strategies: increasing lean protein intake, decreasing simple carbohydrates, increasing vegetables, increasing water intake, decreasing eating out, no skipping meals, meal planning and cooking strategies, keeping healthy foods in the home and planning for success.  Stacey Page has agreed to follow-up with our clinic in 2-3 weeks. She was informed of the importance of frequent follow-up visits to maximize her success with intensive lifestyle modifications for her multiple health conditions.   Objective:   Blood pressure 130/82, pulse 65, temperature 98.1 F (36.7 C), height 5\' 6"  (1.676 m), weight (!) 346 lb (156.9 kg), SpO2 92 %. Body mass index is 55.85 kg/m.  General: Cooperative, alert, well developed, in no acute distress. HEENT: Conjunctivae and lids unremarkable. Cardiovascular: Regular rhythm.  Lungs: Normal work of breathing. Neurologic: No focal deficits.   Lab Results  Component Value Date   CREATININE 1.75 (H) 05/21/2020   BUN 23 05/21/2020   NA 136 05/21/2020   K 4.2 05/21/2020   CL 101 05/21/2020   CO2 27 05/21/2020   Lab Results  Component Value Date   ALT 10 05/14/2020   AST 10 05/14/2020   ALKPHOS 64 05/14/2020   BILITOT 0.4 05/14/2020   Lab Results  Component Value Date   HGBA1C 6.1 05/14/2020   HGBA1C 6.0 (H) 10/28/2019   HGBA1C 5.9 (  H) 07/04/2019   HGBA1C 6.0 (H) 03/25/2019   HGBA1C 6.1 11/28/2018   Lab Results  Component Value Date   INSULIN 8.6 10/28/2019   INSULIN 9.2 07/04/2019   INSULIN 8.8 03/25/2019   Lab Results  Component Value Date   TSH 0.51  11/28/2018   Lab Results  Component Value Date   CHOL 116 05/14/2020   HDL 51.60 05/14/2020   LDLCALC 52 05/14/2020   TRIG 61.0 05/14/2020   CHOLHDL 2 05/14/2020   Lab Results  Component Value Date   WBC 4.1 05/14/2020   HGB 11.3 (L) 05/14/2020   HCT 35.0 (L) 05/14/2020   MCV 86.9 05/14/2020   PLT 204.0 05/14/2020   No results found for: IRON, TIBC, FERRITIN  Obesity Behavioral Intervention:   Approximately 15 minutes were spent on the discussion below.  ASK: We discussed the diagnosis of obesity with Stacey Page today and Stacey Page agreed to give Stacey Page permission to discuss obesity behavioral modification therapy today.  ASSESS: Stacey Page has the diagnosis of obesity and her BMI today is 55.9. Stacey Page is in the action stage of change.   ADVISE: Stacey Page was educated on the multiple health risks of obesity as well as the benefit of weight loss to improve her health. She was advised of the need for long term treatment and the importance of lifestyle modifications to improve her current health and to decrease her risk of future health problems.  AGREE: Multiple dietary modification options and treatment options were discussed and Stacey Page agreed to follow the recommendations documented in the above note.  ARRANGE: Stacey Page was educated on the importance of frequent visits to treat obesity as outlined per CMS and USPSTF guidelines and agreed to schedule her next follow up appointment today.  Attestation Statements:   Reviewed by clinician on day of visit: allergies, medications, problem list, medical history, surgical history, family history, social history, and previous encounter notes.  Coral Ceo, CMA, am acting as Location manager for [employer], DO.  I have reviewed the above documentation for accuracy and completeness, and I agree with the above. Jearld Lesch, DO

## 2020-07-09 ENCOUNTER — Other Ambulatory Visit: Payer: Self-pay

## 2020-07-09 ENCOUNTER — Ambulatory Visit (INDEPENDENT_AMBULATORY_CARE_PROVIDER_SITE_OTHER): Payer: Medicare Other

## 2020-07-09 DIAGNOSIS — M2042 Other hammer toe(s) (acquired), left foot: Secondary | ICD-10-CM | POA: Diagnosis not present

## 2020-07-09 DIAGNOSIS — E1142 Type 2 diabetes mellitus with diabetic polyneuropathy: Secondary | ICD-10-CM

## 2020-07-09 DIAGNOSIS — M2041 Other hammer toe(s) (acquired), right foot: Secondary | ICD-10-CM | POA: Diagnosis not present

## 2020-07-09 NOTE — Progress Notes (Signed)
Patient in office today to pick-up new diabetic shoes and custom inserts. Patient was educated on the break-in process and patient verbalized understanding using the teach back method. Patient tried on the shoes with the custom inserts and was satisfied with the fit and comfort level. Patient was advised to call the office with any questions, comments, or concerns about the shoes or inserts.

## 2020-07-14 ENCOUNTER — Ambulatory Visit
Admission: RE | Admit: 2020-07-14 | Discharge: 2020-07-14 | Disposition: A | Discharge status: Home / Self Care | Payer: Medicare Other | Source: Ambulatory Visit | Attending: Nurse Practitioner | Admitting: Nurse Practitioner

## 2020-07-14 ENCOUNTER — Other Ambulatory Visit: Payer: Self-pay

## 2020-07-14 DIAGNOSIS — Z1231 Encounter for screening mammogram for malignant neoplasm of breast: Secondary | ICD-10-CM

## 2020-07-15 ENCOUNTER — Other Ambulatory Visit (INDEPENDENT_AMBULATORY_CARE_PROVIDER_SITE_OTHER): Payer: Self-pay | Admitting: Bariatrics

## 2020-07-15 DIAGNOSIS — E669 Obesity, unspecified: Secondary | ICD-10-CM

## 2020-07-15 DIAGNOSIS — E1169 Type 2 diabetes mellitus with other specified complication: Secondary | ICD-10-CM

## 2020-07-15 NOTE — Telephone Encounter (Signed)
Dr.Brown 

## 2020-07-23 ENCOUNTER — Ambulatory Visit (INDEPENDENT_AMBULATORY_CARE_PROVIDER_SITE_OTHER): Payer: Medicare Other | Admitting: Bariatrics

## 2020-08-03 DIAGNOSIS — N1832 Chronic kidney disease, stage 3b: Secondary | ICD-10-CM | POA: Diagnosis not present

## 2020-08-04 ENCOUNTER — Ambulatory Visit (INDEPENDENT_AMBULATORY_CARE_PROVIDER_SITE_OTHER): Payer: Medicare Other | Admitting: *Deleted

## 2020-08-04 DIAGNOSIS — Z0000 Encounter for general adult medical examination without abnormal findings: Secondary | ICD-10-CM | POA: Diagnosis not present

## 2020-08-04 NOTE — Progress Notes (Signed)
Subjective:   Stacey Page is a 70 y.o. female who presents for Medicare Annual (Subsequent) preventive examination.  I connected with  Stacey Page on 08/04/20 by a telephone enabled telemedicine application and verified that I am speaking with the correct person using two identifiers.   I discussed the limitations of evaluation and management by telemedicine. The patient expressed understanding and agreed to proceed.  Patient location: home  Provider location:telephone visit    Review of Systems    na Cardiac Risk Factors include: advanced age (>31men, >93 women);diabetes mellitus;hypertension     Objective:    Today's Vitals   There is no height or weight on file to calculate BMI.  Advanced Directives 08/04/2020 07/31/2019 12/24/2018 07/04/2018 06/28/2017 03/21/2017 04/04/2016  Does Patient Have a Medical Advance Directive? No No No No No No No  Would patient like information on creating a medical advance directive? No - Patient declined No - Patient declined No - Patient declined No - Patient declined - No - Patient declined Yes (MAU/Ambulatory/Procedural Areas - Information given)    Current Medications (verified) Outpatient Encounter Medications as of 08/04/2020  Medication Sig   acetaminophen (TYLENOL) 500 MG tablet Take 1 tablet (500 mg total) by mouth every 6 (six) hours as needed.   albuterol (PROAIR HFA) 108 (90 Base) MCG/ACT inhaler Inhale 2 puffs into the lungs every 6 (six) hours as needed for wheezing or shortness of breath.   aspirin 81 MG tablet Take 1 tablet (81 mg total) by mouth daily with breakfast.   atorvastatin (LIPITOR) 20 MG tablet Take 1 tablet (20 mg total) by mouth daily at 6 PM.   Calcium Carbonate-Vitamin D 600-400 MG-UNIT tablet Take 1 tablet by mouth daily.   Clobetasol Prop Emollient Base (CLOBETASOL PROPIONATE E) 0.05 % emollient cream Apply 1 application topically daily as needed (skin irritations).   furosemide (LASIX) 40 MG tablet TAKE  1 TAB DAILY, TAKE AN ADDITIONAL TAB AT 2 PM IF INCREASED SWELLING AND/OR SHORTNESS OF BREATH   gabapentin (NEURONTIN) 100 MG capsule Take 1 capsule (100 mg total) by mouth 2 (two) times daily.   hydrALAZINE (APRESOLINE) 25 MG tablet Take 1 tablet (25 mg total) by mouth 3 (three) times daily.   lisinopril (ZESTRIL) 20 MG tablet TAKE ONE TABLET BY MOUTH ONCE DAILY WITH BREAKFAST   mometasone (NASONEX) 50 MCG/ACT nasal spray Place 2 sprays into the nose daily.   montelukast (SINGULAIR) 10 MG tablet Take 1 tablet (10 mg total) by mouth at bedtime.   vitamin C (ASCORBIC ACID) 500 MG tablet Take 500 mg by mouth daily.    carvedilol (COREG) 25 MG tablet Take 1 tablet (25 mg total) by mouth 2 (two) times daily.   Semaglutide,0.25 or 0.5MG /DOS, (OZEMPIC, 0.25 OR 0.5 MG/DOSE,) 2 MG/1.5ML SOPN Inject 0.25 mg into the skin once a week.   No facility-administered encounter medications on file as of 08/04/2020.    Allergies (verified) Patient has no known allergies.   History: Past Medical History:  Diagnosis Date   Anxiety    doesn't take any meds   Arthritis of right knee 03/14/2016   Asthma    Back pain    Chronic diastolic CHF (congestive heart failure) (HCC)    HF with Preserved EF (60-65%) - Grade II Diastolic Dysfunction (Hypertensive Heart Disease). takes Furosemide daily   COPD (chronic obstructive pulmonary disease) (HCC)    Albuterol as needed   Depression    doesn't take meds   Diabetes ([place])  takes Januvia daily   Eczema    uses cream as needed   GERD (gastroesophageal reflux disease)    History of bronchitis as a child    HTN (hypertension)    Insomnia    Mild aortic stenosis by prior echocardiogram 04/2017   Mild stenosis: Mean gradient 15 mmHg, peak gradient 28 mmHg   OA (osteoarthritis)    Obese    OSA (obstructive sleep apnea) 02/25/2014   wears CPAP at night   Peripheral neuropathy    takes Gabapentin as needed   Pneumonia    hx of-2010   Seasonal allergies    uses  Flonase daily   Sleep apnea    SOB (shortness of breath)    Past Surgical History:  Procedure Laterality Date   BREAST BIOPSY Right 03/2018   BREAST LUMPECTOMY WITH RADIOACTIVE SEED LOCALIZATION Right 12/28/2018   Procedure: RIGHT BREAST LUMPECTOMY WITH RADIOACTIVE SEED LOCALIZATION;  Surgeon: Jovita Kussmaul, MD;  Location: [facility];  Service: General;  Laterality: Right;   BREAST SURGERY     CESAREAN SECTION  x2   COLONOSCOPY     CORONARY CALCIUM SCORE & CT ANGIOGRAM  09/2017   Coronary Ca Score = 11 (low).  CTA- no obstructive CAD (minimal disease)   JOINT REPLACEMENT     KNEE ARTHROSCOPY Right    LUNG BIOPSY Right 06/10/2014   Procedure: LUNG BIOPSY;  Surgeon: Ivin Poot, MD;  Location: [facility];  Service: Thoracic;  Laterality: Right;   POLYPECTOMY     throat   TOTAL KNEE ARTHROPLASTY Right 03/14/2016   Procedure: RIGHT TOTAL KNEE ARTHROPLASTY;  Surgeon: Marybelle Killings, MD;  Location: [facility];  Service: Orthopedics;  Laterality: Right;   TRANSTHORACIC ECHOCARDIOGRAM  11/04/2019   EF 60-65%.  Moderate LVH.  GRII DD.  Mild LV dilation.  Mild LA dilation.  Thickened-calcified aortic valve-Mild AS (mean gradient 13 mmHg, peak 29 mmHg--similar to 2019)   VIDEO ASSISTED THORACOSCOPY Right 06/10/2014   Procedure: VIDEO ASSISTED THORACOSCOPY;  Surgeon: Ivin Poot, MD;  Location: [facility] OR;  Service: Thoracic;  Laterality: Right;   Family History  Problem Relation Age of Onset   COPD Father    Hypothyroidism Father    Anemia Father        iron deficiency   High blood pressure Father    Alcoholism Father    Cancer Mother 86       pancreatic   High blood pressure Mother    High Cholesterol Mother    Breast cancer Neg Hx    Social History   Socioeconomic History   Marital status: Widowed    Spouse name: Not on file   Number of children: Not on file   Years of education: Not on file   Highest education level: Not on file  Occupational History   Occupation: retired  Tobacco Use    Smoking status: Former    Packs/day: 0.25    Years: 15.00    Pack years: 3.75    Types: Cigarettes    Quit date: 02/08/1980    Years since quitting: 40.5   Smokeless tobacco: Former  Scientific laboratory technician Use: Never used  Substance and Sexual Activity   Alcohol use: Yes    Alcohol/week: 0.0 standard drinks    Comment: occassional/social/rare   Drug use: Not Currently   Sexual activity: Not Currently  Other Topics Concern   Not on file  Social History Narrative   Not on file  Social Determinants of Health   Financial Resource Strain: Low Risk    Difficulty of Paying Living Expenses: Not hard at all  Food Insecurity: No Food Insecurity   Worried About Charity fundraiser in the Last Year: Never true   Arboriculturist in the Last Year: Never true  Transportation Needs: Not on file  Physical Activity: Inactive   Days of Exercise per Week: 0 days   Minutes of Exercise per Session: 0 min  Stress: No Stress Concern Present   Feeling of Stress : Not at all  Social Connections: Moderately Isolated   Frequency of Communication with Friends and Family: More than three times a week   Frequency of Social Gatherings with Friends and Family: Twice a week   Attends Religious Services: More than 4 times per year   Active Member of [employer] or Organizations: No   Attends Archivist Meetings: Never   Marital Status: Widowed    Tobacco Counseling Counseling given: Not Answered   Clinical Intake:  Pre-visit preparation completed: Yes  Pain : No/denies pain     Nutritional Risks: None Diabetes: Yes CBG done?: No Did pt. bring in CBG monitor from home?: No  How often do you need to have someone help you when you read instructions, pamphlets, or other written materials from your doctor or pharmacy?: 1 - Never  Diabetic? YES  Nutrition Risk Assessment:  Has the patient had any N/V/D within the last 2 months?  No  Does the patient have any non-healing wounds?  No  Has  the patient had any unintentional weight loss or weight gain?  No   Diabetes:  Is the patient diabetic?  Yes  If diabetic, was a CBG obtained today?  No  Did the patient bring in their glucometer from home?  No  How often do you monitor your CBG's? never.   Financial Strains and Diabetes Management:  Are you having any financial strains with the device, your supplies or your medication? No .  Does the patient want to be seen by Chronic Care Management for management of their diabetes?  No  Would the patient like to be referred to a Nutritionist or for Diabetic Management?  No   Diabetic Exams:  Diabetic Eye Exam:. Overdue for diabetic eye exam. Pt has been advised about the importance in completing this exam. A referral has been placed today. Message sent to referral coordinator for scheduling purposes. Advised pt to expect a call from office referred to regarding appt.  Diabetic Foot Exam: Completed 05-16-2020. Pt has been advised about the importance in completing this exam.   Interpreter Needed?: No  Information entered by :: Leroy Kennedy LPN   Activities of Daily Living In your present state of health, do you have any difficulty performing the following activities: 08/04/2020  Hearing? N  Vision? N  Difficulty concentrating or making decisions? N  Walking or climbing stairs? Y  Dressing or bathing? N  Doing errands, shopping? N  Preparing Food and eating ? N  Using the Toilet? N  In the past six months, have you accidently leaked urine? N  Do you have problems with loss of bowel control? N  Managing your Medications? N  Managing your Finances? N  Housekeeping or managing your Housekeeping? N  Some recent data might be hidden    Patient Care Team: [name], [name], NP as PCP - General (Internal Medicine) Leonie Man, MD as PCP - Cardiology (Cardiology) Daron Offer  A, RPH as Pharmacist (Pharmacist)  Indicate any recent Medical Services you may have  received from other than [facility] providers in the past year (date may be approximate).     Assessment:   This is a routine wellness examination for [place].  Hearing/Vision screen Hearing Screening - Comments:: No hearing troubles Vision Screening - Comments:: Not up to date [place]   Dietary issues and exercise activities discussed: Current Exercise Habits: The patient does not participate in regular exercise at present   Goals Addressed             This Visit's Progress    Patient Stated       Loose weight 10lb        Depression Screen PHQ 2/9 Scores 08/04/2020 05/14/2020 07/31/2019 03/25/2019 02/28/2019 11/28/2018 11/28/2018  PHQ - 2 Score 0 0 0 4 0 0 0  PHQ- 9 Score - 1 - 20 - 0 -  Exception Documentation - - - Medical reason - - -    Fall Risk Fall Risk  08/04/2020 05/14/2020 01/29/2020 07/31/2019 02/28/2019  Falls in the past year? 0 0 0 0 0  Number falls in past yr: 0 0 0 0 -  Injury with Fall? 0 0 0 0 -  Risk for fall due to : - No Fall Risks - No Fall Risks -  Risk for fall due to: Comment - - - - -  Follow up Falls evaluation completed;Falls prevention discussed Falls evaluation completed - Falls prevention discussed -    FALL RISK PREVENTION PERTAINING TO THE HOME:  Any stairs in or around the home? No  If so, are there any without handrails? No  Home free of loose throw rugs in walkways, pet beds, electrical cords, etc? Yes  Adequate lighting in your home to reduce risk of falls? Yes   ASSISTIVE DEVICES UTILIZED TO PREVENT FALLS:  Life alert? No  Use of a cane, walker or w/c? No  Grab bars in the bathroom? No  Shower chair or bench in shower? No  Elevated toilet seat or a handicapped toilet? No   TIMED UP AND GO:  Was the test performed? No .  Tele-health visit    Cognitive Function:  Normal cognitive status assessed by direct observation by this Nurse Health Advisor. No abnormalities found.  MMSE - Mini Mental State Exam 11/28/2018  Orientation  to time 5  Orientation to Place 5  Registration 3  Attention/ Calculation 5  Recall 3  Language- name 2 objects 2  Language- repeat 1  Language- follow 3 step command 3  Language- read & follow direction 1  Write a sentence 1  Copy design 1  Total score 30     6CIT Screen 07/31/2019  What Year? 0 points  What month? 0 points  What time? 0 points  Count back from 20 0 points  Months in reverse 0 points  Repeat phrase 0 points  Total Score 0    Immunizations Immunization History  Administered Date(s) Administered   Fluad Quad(high Dose 65+) 11/28/2018, 01/29/2020   Influenza Split 11/21/2016   Influenza,inj,Quad PF,6+ Mos 01/12/2015, 11/19/2015   PFIZER(Purple Top)SARS-COV-2 Vaccination 03/31/2019, 04/23/2019, 03/21/2020   Pneumococcal Conjugate-13 01/19/2017   Pneumococcal Polysaccharide-23 01/12/2015, 05/14/2020    TDAP status: Due, Education has been provided regarding the importance of this vaccine. Advised may receive this vaccine at local pharmacy or Health Dept. Aware to provide a copy of the vaccination record if obtained from local pharmacy or Health Dept. Verbalized acceptance  and understanding.  Flu Vaccine status: Up to date  Pneumococcal vaccine status: Up to date  Covid-19 vaccine status: Completed vaccines  Qualifies for Shingles Vaccine? Yes   Zostavax completed No   Shingrix Completed?: No.    Education has been provided regarding the importance of this vaccine. Patient has been advised to call insurance company to determine out of pocket expense if they have not yet received this vaccine. Advised may also receive vaccine at local pharmacy or Health Dept. Verbalized acceptance and understanding.  Screening Tests Health Maintenance  Topic Date Due   Zoster Vaccines- Shingrix (1 of 2) Never done   OPHTHALMOLOGY EXAM  11/01/2019   COVID-19 Vaccine (4 - Booster for Pfizer series) 06/18/2020   TETANUS/TDAP  05/14/2021 (Originally 02/07/2017)   Fecal DNA  (Cologuard)  09/01/2020   INFLUENZA VACCINE  09/07/2020   HEMOGLOBIN A1C  11/13/2020   FOOT EXAM  05/14/2021   MAMMOGRAM  07/15/2022   DEXA SCAN  Completed   Hepatitis C Screening  Completed   PNA vac Low Risk Adult  Completed   HPV VACCINES  Aged Out    Health Maintenance  Health Maintenance Due  Topic Date Due   Zoster Vaccines- Shingrix (1 of 2) Never done   OPHTHALMOLOGY EXAM  11/01/2019   COVID-19 Vaccine (4 - Booster for [place] series) 06/18/2020   Colonoscopy will call when ready for referral  Mammogram status: Completed  . Repeat every year  Bone Density status: Completed 10-03-2019. Results reflect: Bone density results: OSTEOPOROSIS. Repeat every 2 years.  Lung Cancer Screening: (Low Dose CT Chest recommended if Age 29-80 years, 30 pack-year currently smoking OR have quit w/in 15years.) does not qualify.   Lung Cancer Screening Referral: na  Additional Screening:  Hepatitis C Screening:  qualify; Completed  Vision Screening: Recommended annual ophthalmology exams for early detection of glaucoma and other disorders of the eye. Is the patient up to date with their annual eye exam?  No  Who is the provider or what is the name of the office in which the patient attends annual eye exams? Will call when ready going to go to [facility] If pt is not established with a provider, would they like to be referred to a provider to establish care? No .   Dental Screening: Recommended annual dental exams for proper oral hygiene  Community Resource Referral / Chronic Care Management: CRR required this visit?  No   CCM required this visit?  No      Plan:     I have personally reviewed and noted the following in the patient's chart:   Medical and social history Use of alcohol, tobacco or illicit drugs  Current medications and supplements including opioid prescriptions.  Functional ability and status Nutritional status Physical activity Advanced directives List of other  physicians Hospitalizations, surgeries, and ER visits in previous 12 months Vitals Screenings to include cognitive, depression, and falls Referrals and appointments  In addition, I have reviewed and discussed with patient certain preventive protocols, quality metrics, and best practice recommendations. A written personalized care plan for preventive services as well as general preventive health recommendations were provided to patient.     Leroy Kennedy, LPN   [id number]/[id number]   Nurse Notes: na

## 2020-08-04 NOTE — Patient Instructions (Addendum)
[name] , Thank you for taking time to come for your Medicare Wellness Visit. I appreciate your ongoing commitment to your health goals. Please review the following plan we discussed and let me know if I can assist you in the future.   Screening recommendations/referrals: Colonoscopy: Education provided Mammogram: up to date Bone Density: up to date Recommended yearly ophthalmology/optometry visit for glaucoma screening and checkup Recommended yearly dental visit for hygiene and checkup  Vaccinations: Influenza vaccine: up to date Pneumococcal vaccine: up to date Tdap vaccine: education provided Shingles vaccine: education provided    Advanced directives: education provided  Conditions/risks identified: na  Next appointment: 08-05-2021 @ 11:15    Medicare Annual Wellness (telephone)   Preventive Care 62 Years and Older, Female Preventive care refers to lifestyle choices and visits with your health care provider that can promote health and wellness. What does preventive care include? A yearly physical exam. This is also called an annual well check. Dental exams once or twice a year. Routine eye exams. Ask your health care provider how often you should have your eyes checked. Personal lifestyle choices, including: Daily care of your teeth and gums. Regular physical activity. Eating a healthy diet. Avoiding tobacco and drug use. Limiting alcohol use. Practicing safe sex. Taking low-dose aspirin every day. Taking vitamin and mineral supplements as recommended by your health care provider. What happens during an annual well check? The services and screenings done by your health care provider during your annual well check will depend on your age, overall health, lifestyle risk factors, and family history of disease. Counseling  Your health care provider may ask you questions about your: Alcohol use. Tobacco use. Drug use. Emotional well-being. Home and relationship  well-being. Sexual activity. Eating habits. History of falls. Memory and ability to understand (cognition). Work and work Statistician. Reproductive health. Screening  You may have the following tests or measurements: Height, weight, and BMI. Blood pressure. Lipid and cholesterol levels. These may be checked every 5 years, or more frequently if you are over 75 years old. Skin check. Lung cancer screening. You may have this screening every year starting at age 71 if you have a 30-pack-year history of smoking and currently smoke or have quit within the past 15 years. Fecal occult blood test (FOBT) of the stool. You may have this test every year starting at age 44. Flexible sigmoidoscopy or colonoscopy. You may have a sigmoidoscopy every 5 years or a colonoscopy every 10 years starting at age 73. Hepatitis C blood test. Hepatitis B blood test. Sexually transmitted disease (STD) testing. Diabetes screening. This is done by checking your blood sugar (glucose) after you have not eaten for a while (fasting). You may have this done every 1-3 years. Bone density scan. This is done to screen for osteoporosis. You may have this done starting at age 58. Mammogram. This may be done every 1-2 years. Talk to your health care provider about how often you should have regular mammograms. Talk with your health care provider about your test results, treatment options, and if necessary, the need for more tests. Vaccines  Your health care provider may recommend certain vaccines, such as: Influenza vaccine. This is recommended every year. Tetanus, diphtheria, and acellular pertussis (Tdap, Td) vaccine. You may need a Td booster every 10 years. Zoster vaccine. You may need this after age 71. Pneumococcal 13-valent conjugate (PCV13) vaccine. One dose is recommended after age 64. Pneumococcal polysaccharide (PPSV23) vaccine. One dose is recommended after age 68. Talk to  your health care provider about which  screenings and vaccines you need and how often you need them. This information is not intended to replace advice given to you by your health care provider. Make sure you discuss any questions you have with your health care provider. Document Released: 02/20/2015 Document Revised: 10/14/2015 Document Reviewed: 11/25/2014 Elsevier Interactive Patient Education  2017 [place] Prevention in the Home Falls can cause injuries. They can happen to people of all ages. There are many things you can do to make your home safe and to help prevent falls. What can I do on the outside of my home? Regularly fix the edges of walkways and driveways and fix any cracks. Remove anything that might make you trip as you walk through a door, such as a raised step or threshold. Trim any bushes or trees on the path to your home. Use bright outdoor lighting. Clear any walking paths of anything that might make someone trip, such as rocks or tools. Regularly check to see if handrails are loose or broken. Make sure that both sides of any steps have handrails. Any raised decks and porches should have guardrails on the edges. Have any leaves, snow, or ice cleared regularly. Use sand or salt on walking paths during winter. Clean up any spills in your garage right away. This includes oil or grease spills. What can I do in the bathroom? Use night lights. Install grab bars by the toilet and in the tub and shower. Do not use towel bars as grab bars. Use non-skid mats or decals in the tub or shower. If you need to sit down in the shower, use a plastic, non-slip stool. Keep the floor dry. Clean up any water that spills on the floor as soon as it happens. Remove soap buildup in the tub or shower regularly. Attach bath mats securely with double-sided non-slip rug tape. Do not have throw rugs and other things on the floor that can make you trip. What can I do in the bedroom? Use night lights. Make sure that you have a  light by your bed that is easy to reach. Do not use any sheets or blankets that are too big for your bed. They should not hang down onto the floor. Have a firm chair that has side arms. You can use this for support while you get dressed. Do not have throw rugs and other things on the floor that can make you trip. What can I do in the kitchen? Clean up any spills right away. Avoid walking on wet floors. Keep items that you use a lot in easy-to-reach places. If you need to reach something above you, use a strong step stool that has a grab bar. Keep electrical cords out of the way. Do not use floor polish or wax that makes floors slippery. If you must use wax, use non-skid floor wax. Do not have throw rugs and other things on the floor that can make you trip. What can I do with my stairs? Do not leave any items on the stairs. Make sure that there are handrails on both sides of the stairs and use them. Fix handrails that are broken or loose. Make sure that handrails are as long as the stairways. Check any carpeting to make sure that it is firmly attached to the stairs. Fix any carpet that is loose or worn. Avoid having throw rugs at the top or bottom of the stairs. If you do have throw rugs, attach  them to the floor with carpet tape. Make sure that you have a light switch at the top of the stairs and the bottom of the stairs. If you do not have them, ask someone to add them for you. What else can I do to help prevent falls? Wear shoes that: Do not have high heels. Have rubber bottoms. Are comfortable and fit you well. Are closed at the toe. Do not wear sandals. If you use a stepladder: Make sure that it is fully opened. Do not climb a closed stepladder. Make sure that both sides of the stepladder are locked into place. Ask someone to hold it for you, if possible. Clearly mark and make sure that you can see: Any grab bars or handrails. First and last steps. Where the edge of each step  is. Use tools that help you move around (mobility aids) if they are needed. These include: Canes. Walkers. Scooters. Crutches. Turn on the lights when you go into a dark area. Replace any light bulbs as soon as they burn out. Set up your furniture so you have a clear path. Avoid moving your furniture around. If any of your floors are uneven, fix them. If there are any pets around you, be aware of where they are. Review your medicines with your doctor. Some medicines can make you feel dizzy. This can increase your chance of falling. Ask your doctor what other things that you can do to help prevent falls. This information is not intended to replace advice given to you by your health care provider. Make sure you discuss any questions you have with your health care provider. Document Released: 11/20/2008 Document Revised: 07/02/2015 Document Reviewed: 02/28/2014 Elsevier Interactive Patient Education  2017 [employer].

## 2020-08-05 ENCOUNTER — Ambulatory Visit (INDEPENDENT_AMBULATORY_CARE_PROVIDER_SITE_OTHER): Payer: Medicare Other | Admitting: Bariatrics

## 2020-08-06 ENCOUNTER — Telehealth: Payer: Self-pay | Admitting: *Deleted

## 2020-08-06 NOTE — Chronic Care Management (AMB) (Signed)
  Chronic Care Management   Note  08/06/2020 Name: Stacey Page MRN: [id number] DOB: 1950/07/13  [name] is a 70 y.o. year old female who is a primary care patient of [name], [name], NP. I reached out to [name] by phone today in response to a referral sent by Ms. Vania Butkiewicz's PCP [name], [name], NP     Ms. Mancebo was given information about Chronic Care Management services today including:  CCM service includes personalized support from designated clinical staff supervised by her physician, including individualized plan of care and coordination with other care providers 24/7 contact phone numbers for assistance for urgent and routine care needs. Service will only be billed when office clinical staff spend 20 minutes or more in a month to coordinate care. Only one practitioner may furnish and bill the service in a calendar month. The patient may stop CCM services at any time (effective at the end of the month) by phone call to the office staff. The patient will be responsible for cost sharing (co-pay) of up to 20% of the service fee (after annual deductible is met).  Patient agreed to services and verbal consent obtained.   Follow up plan: Telephone appointment with care management team member scheduled for:08/19/2020  Julian Hy, [place] Management  Direct Dial: [phone]

## 2020-08-11 DIAGNOSIS — N1832 Chronic kidney disease, stage 3b: Secondary | ICD-10-CM | POA: Diagnosis not present

## 2020-08-12 ENCOUNTER — Encounter (INDEPENDENT_AMBULATORY_CARE_PROVIDER_SITE_OTHER): Payer: Self-pay

## 2020-08-12 ENCOUNTER — Ambulatory Visit (INDEPENDENT_AMBULATORY_CARE_PROVIDER_SITE_OTHER): Payer: Medicare Other | Admitting: Physician Assistant

## 2020-08-12 ENCOUNTER — Other Ambulatory Visit: Payer: Self-pay

## 2020-08-12 ENCOUNTER — Emergency Department (HOSPITAL_COMMUNITY)
Admission: EM | Admit: 2020-08-12 | Discharge: 2020-08-13 | Disposition: A | Discharge status: Home / Self Care | Payer: Medicare Other | Attending: Emergency Medicine | Admitting: Emergency Medicine

## 2020-08-12 ENCOUNTER — Emergency Department (HOSPITAL_COMMUNITY): Payer: Medicare Other

## 2020-08-12 ENCOUNTER — Encounter (INDEPENDENT_AMBULATORY_CARE_PROVIDER_SITE_OTHER): Payer: Self-pay | Admitting: Physician Assistant

## 2020-08-12 ENCOUNTER — Telehealth (INDEPENDENT_AMBULATORY_CARE_PROVIDER_SITE_OTHER): Payer: Self-pay

## 2020-08-12 ENCOUNTER — Telehealth: Payer: Self-pay | Admitting: *Deleted

## 2020-08-12 DIAGNOSIS — N1832 Chronic kidney disease, stage 3b: Secondary | ICD-10-CM | POA: Diagnosis not present

## 2020-08-12 DIAGNOSIS — I509 Heart failure, unspecified: Secondary | ICD-10-CM | POA: Diagnosis not present

## 2020-08-12 DIAGNOSIS — I5032 Chronic diastolic (congestive) heart failure: Secondary | ICD-10-CM | POA: Diagnosis not present

## 2020-08-12 DIAGNOSIS — R509 Fever, unspecified: Secondary | ICD-10-CM | POA: Insufficient documentation

## 2020-08-12 DIAGNOSIS — Z5321 Procedure and treatment not carried out due to patient leaving prior to being seen by health care provider: Secondary | ICD-10-CM | POA: Diagnosis not present

## 2020-08-12 DIAGNOSIS — R0602 Shortness of breath: Secondary | ICD-10-CM | POA: Insufficient documentation

## 2020-08-12 DIAGNOSIS — D631 Anemia in chronic kidney disease: Secondary | ICD-10-CM | POA: Diagnosis not present

## 2020-08-12 DIAGNOSIS — R519 Headache, unspecified: Secondary | ICD-10-CM | POA: Insufficient documentation

## 2020-08-12 DIAGNOSIS — Z20822 Contact with and (suspected) exposure to covid-19: Secondary | ICD-10-CM | POA: Insufficient documentation

## 2020-08-12 DIAGNOSIS — E1122 Type 2 diabetes mellitus with diabetic chronic kidney disease: Secondary | ICD-10-CM | POA: Diagnosis not present

## 2020-08-12 DIAGNOSIS — I129 Hypertensive chronic kidney disease with stage 1 through stage 4 chronic kidney disease, or unspecified chronic kidney disease: Secondary | ICD-10-CM | POA: Diagnosis not present

## 2020-08-12 LAB — CBC WITH DIFFERENTIAL/PLATELET
Abs Immature Granulocytes: 0.02 10*3/uL (ref 0.00–0.07)
Basophils Absolute: 0 10*3/uL (ref 0.0–0.1)
Basophils Relative: 1 %
Eosinophils Absolute: 0 10*3/uL (ref 0.0–0.5)
Eosinophils Relative: 1 %
HCT: 35.5 % — ABNORMAL LOW (ref 36.0–46.0)
Hemoglobin: 10.9 g/dL — ABNORMAL LOW (ref 12.0–15.0)
Immature Granulocytes: 0 %
Lymphocytes Relative: 18 %
Lymphs Abs: 1 10*3/uL (ref 0.7–4.0)
MCH: 27.8 pg (ref 26.0–34.0)
MCHC: 30.7 g/dL (ref 30.0–36.0)
MCV: 90.6 fL (ref 80.0–100.0)
Monocytes Absolute: 0.5 10*3/uL (ref 0.1–1.0)
Monocytes Relative: 9 %
Neutro Abs: 3.8 10*3/uL (ref 1.7–7.7)
Neutrophils Relative %: 71 %
Platelets: 213 10*3/uL (ref 150–400)
RBC: 3.92 MIL/uL (ref 3.87–5.11)
RDW: 16.2 % — ABNORMAL HIGH (ref 11.5–15.5)
WBC: 5.3 10*3/uL (ref 4.0–10.5)
nRBC: 0 % (ref 0.0–0.2)

## 2020-08-12 LAB — RESP PANEL BY RT-PCR (FLU A&B, COVID) ARPGX2
Influenza A by PCR: NEGATIVE
Influenza B by PCR: NEGATIVE
SARS Coronavirus 2 by RT PCR: NEGATIVE

## 2020-08-12 LAB — COMPREHENSIVE METABOLIC PANEL
ALT: 14 U/L (ref 0–44)
AST: 15 U/L (ref 15–41)
Albumin: 3.4 g/dL — ABNORMAL LOW (ref 3.5–5.0)
Alkaline Phosphatase: 62 U/L (ref 38–126)
Anion gap: 7 (ref 5–15)
BUN: 17 mg/dL (ref 8–23)
CO2: 27 mmol/L (ref 22–32)
Calcium: 9.5 mg/dL (ref 8.9–10.3)
Chloride: 103 mmol/L (ref 98–111)
Creatinine, Ser: 1.66 mg/dL — ABNORMAL HIGH (ref 0.44–1.00)
GFR, Estimated: 33 mL/min — ABNORMAL LOW (ref >60)
Glucose, Bld: 98 mg/dL (ref 70–99)
Potassium: 5 mmol/L (ref 3.5–5.1)
Sodium: 137 mmol/L (ref 135–145)
Total Bilirubin: 0.6 mg/dL (ref 0.3–1.2)
Total Protein: 7.7 g/dL (ref 6.5–8.1)

## 2020-08-12 LAB — BRAIN NATRIURETIC PEPTIDE: B Natriuretic Peptide: 374.8 pg/mL — ABNORMAL HIGH (ref 0.0–100.0)

## 2020-08-12 MED ORDER — ACETAMINOPHEN 325 MG PO TABS
650.0000 mg | ORAL_TABLET | Freq: Once | ORAL | Status: DC
  Filled 2020-08-12: qty 2

## 2020-08-12 NOTE — ED Provider Notes (Signed)
Emergency Medicine Provider Triage Evaluation Note  Stacey Page , a 71 y.o. female  was evaluated in triage.  Pt complains of shortness of breath, headache, fever.  Patient's symptoms started today.  Patient denies any chest pain, leg swelling, nausea, vomiting, abdominal pain.  Patient has history of CHF and reports she has had significant weight gain.  Patient denies any known sick contacts.  Patient has received vaccination as well as  Review of Systems  Positive: Shortness of breath, headache, fever Negative: Numbness, weakness, facial asymmetry, slurred speech, chest pain, leg swelling, nausea, vomiting, abdominal pain.  Physical Exam  BP (!) 137/124 (BP Location: Left Arm)   Pulse 71   Temp (!) 102.2 F (39 C) (Oral)   Resp 15   Ht 5\' 6"  (1.676 m)   Wt (!) 160.1 kg   SpO2 100%   BMI 56.98 kg/m  Gen:   Awake, no distress   Resp:  Normal effort, lungs clear to auscultation bilaterally MSK:   Moves extremities without difficulty.  +1 edema to knees bilaterally Is no tenderness. Other:  Abdomen soft, nondistended, nontender  Medical Decision Making  Medically screening exam initiated at 7:03 PM.  Appropriate orders placed.  Giulliana Mcroberts was informed that the remainder of the evaluation will be completed by another provider, this initial triage assessment does not replace that evaluation, and the importance of remaining in the ED until their evaluation is complete.  The patient appears stable so that the remainder of the work up may be completed by another provider.      Loni Beckwith, PA-C 08/12/20 1905    Lorelle Gibbs, DO 08/13/20 0013

## 2020-08-12 NOTE — Telephone Encounter (Signed)
Pt came in for visit. T-101.3, BP 187/73. Stated that she was off balance and not feeling well. Abby Potash, PA-C recommended that [facility] be seen at the urgent care. Pt voiced understanding.

## 2020-08-12 NOTE — ED Triage Notes (Signed)
C/o shortness of breath, headache and fever.

## 2020-08-12 NOTE — Chronic Care Management (AMB) (Signed)
  Care Management   Note  08/12/2020 Name: [name] MRN: [id number] DOB: Apr 05, 1950  [name] is a 70 y.o. year old female who is a primary care patient of [name], [name], NP and is actively engaged with the care management team. I reached out to [name] by phone today to assist with re-scheduling an initial visit with the RN Case Manager  Follow up plan: A telephone outreach attempt made patient on way to hospital. The care management team will reach out to the patient again over the next 7 days. If patient returns call to provider office, please advise to call [place] at [phone].  [place] Management

## 2020-08-13 ENCOUNTER — Other Ambulatory Visit: Payer: Self-pay

## 2020-08-13 ENCOUNTER — Telehealth: Payer: Self-pay | Admitting: Nurse Practitioner

## 2020-08-13 NOTE — Telephone Encounter (Signed)
Pt went to ER yesterday and got checked out and they just told her the results would be in [place] and she was hoping someone here can explain the results to here. She request call back at [phone]

## 2020-08-13 NOTE — Telephone Encounter (Signed)
Please advise 

## 2020-08-14 ENCOUNTER — Encounter: Payer: Self-pay | Admitting: Family Medicine

## 2020-08-14 ENCOUNTER — Telehealth: Payer: Self-pay | Admitting: Pulmonary Disease

## 2020-08-14 ENCOUNTER — Other Ambulatory Visit: Payer: Self-pay

## 2020-08-14 ENCOUNTER — Ambulatory Visit (INDEPENDENT_AMBULATORY_CARE_PROVIDER_SITE_OTHER): Payer: Medicare Other | Admitting: Family Medicine

## 2020-08-14 VITALS — BP 122/64 | HR 60 | Temp 98.5°F | Ht 66.0 in | Wt 347.4 lb

## 2020-08-14 DIAGNOSIS — N184 Chronic kidney disease, stage 4 (severe): Secondary | ICD-10-CM

## 2020-08-14 DIAGNOSIS — Z09 Encounter for follow-up examination after completed treatment for conditions other than malignant neoplasm: Secondary | ICD-10-CM

## 2020-08-14 NOTE — Telephone Encounter (Signed)
Pt seen in office 08/14/20

## 2020-08-14 NOTE — Telephone Encounter (Signed)
dsregard

## 2020-08-14 NOTE — Progress Notes (Signed)
Established Patient Office Visit  Subjective:  Patient ID: Stacey Page, female    DOB: September 19, 1950  Age: 70 y.o. MRN: [id number]  CC:  Chief Complaint  Patient presents with   [facility] f/u    HPI Stacey Page presents for emergency room visit follow-up 2 days ago.  She had complained of shortness of breath and headache.  She had had a fever that has resolved.  Denies malaise or fatigue at this time.  She feels good.  She denied cough, wheezing.  Labs were drawn and a chest x-ray was taken but patient left due to the extended wait.  Feeling much better today without fever.  History of diastolic heart failure with dilation of the left ventricle.  Ejection fraction is normal.  History of stage IV CKD.  This is been stable.  Review of labs from emergency room showed normal WBC count and a stable hemoglobin.  She had tested negative for COVID and flu.  BNP was mildly elevated.  CMP confirmed CKD.  Glucose was normal.  Chest x-ray confirmed cardiac enlargement.  There was peribronchial changes noted consistent with edema or pneumonia.  Past Medical History:  Diagnosis Date   Anxiety    doesn't take any meds   Arthritis of right knee 03/14/2016   Asthma    Back pain    Chronic diastolic CHF (congestive heart failure) (HCC)    HF with Preserved EF (60-65%) - Grade II Diastolic Dysfunction (Hypertensive Heart Disease). takes Furosemide daily   COPD (chronic obstructive pulmonary disease) (HCC)    Albuterol as needed   Depression    doesn't take meds   Diabetes ([place])    takes Januvia daily   Eczema    uses cream as needed   GERD (gastroesophageal reflux disease)    History of bronchitis as a child    HTN (hypertension)    Insomnia    Mild aortic stenosis by prior echocardiogram 04/2017   Mild stenosis: Mean gradient 15 mmHg, peak gradient 28 mmHg   OA (osteoarthritis)    Obese    OSA (obstructive sleep apnea) 02/25/2014   wears CPAP at night   Peripheral  neuropathy    takes Gabapentin as needed   Pneumonia    hx of-2010   Seasonal allergies    uses Flonase daily   Sleep apnea    SOB (shortness of breath)     Past Surgical History:  Procedure Laterality Date   BREAST BIOPSY Right 03/2018   BREAST LUMPECTOMY WITH RADIOACTIVE SEED LOCALIZATION Right 12/28/2018   Procedure: RIGHT BREAST LUMPECTOMY WITH RADIOACTIVE SEED LOCALIZATION;  Surgeon: Jovita Kussmaul, MD;  Location: [facility];  Service: General;  Laterality: Right;   BREAST SURGERY     CESAREAN SECTION  x2   COLONOSCOPY     CORONARY CALCIUM SCORE & CT ANGIOGRAM  09/2017   Coronary Ca Score = 11 (low).  CTA- no obstructive CAD (minimal disease)   JOINT REPLACEMENT     KNEE ARTHROSCOPY Right    LUNG BIOPSY Right 06/10/2014   Procedure: LUNG BIOPSY;  Surgeon: Ivin Poot, MD;  Location: [facility];  Service: Thoracic;  Laterality: Right;   POLYPECTOMY     throat   TOTAL KNEE ARTHROPLASTY Right 03/14/2016   Procedure: RIGHT TOTAL KNEE ARTHROPLASTY;  Surgeon: Marybelle Killings, MD;  Location: [facility];  Service: Orthopedics;  Laterality: Right;   TRANSTHORACIC ECHOCARDIOGRAM  11/04/2019   EF 60-65%.  Moderate LVH.  GRII DD.  Mild LV dilation.  Mild LA dilation.  Thickened-calcified aortic valve-Mild AS (mean gradient 13 mmHg, peak 29 mmHg--similar to 2019)   VIDEO ASSISTED THORACOSCOPY Right 06/10/2014   Procedure: VIDEO ASSISTED THORACOSCOPY;  Surgeon: Ivin Poot, MD;  Location[facility] OR;  Service: Thoracic;  Laterality: Right;    Family History  Problem Relation Age of Onset   COPD Father    Hypothyroidism Father    Anemia Father        iron deficiency   High blood pressure Father    Alcoholism Father    Cancer Mother 11       pancreatic   High blood pressure Mother    High Cholesterol Mother    Breast cancer Neg Hx     Social History   Socioeconomic History   Marital status: Widowed    Spouse name: Not on file   Number of children: Not on file   Years of education: Not on file    Highest education level: Not on file  Occupational History   Occupation: retired  Tobacco Use   Smoking status: Former    Packs/day: 0.25    Years: 15.00    Pack years: 3.75    Types: Cigarettes    Quit date: 02/08/1980    Years since quitting: 40.5   Smokeless tobacco: Former  Scientific laboratory technician Use: Never used  Substance and Sexual Activity   Alcohol use: Yes    Alcohol/week: 0.0 standard drinks    Comment: occassional/social/rare   Drug use: Not Currently   Sexual activity: Not Currently  Other Topics Concern   Not on file  Social History Narrative   Not on file   Social Determinants of Health   Financial Resource Strain: Low Risk    Difficulty of Paying Living Expenses: Not hard at all  Food Insecurity: No Food Insecurity   Worried About Charity fundraiser in the Last Year: Never true   Ran Out of Food in the Last Year: Never true  Transportation Needs: Not on file  Physical Activity: Inactive   Days of Exercise per Week: 0 days   Minutes of Exercise per Session: 0 min  Stress: No Stress Concern Present   Feeling of Stress : Not at all  Social Connections: Moderately Isolated   Frequency of Communication with Friends and Family: More than three times a week   Frequency of Social Gatherings with Friends and Family: Twice a week   Attends Religious Services: More than 4 times per year   Active Member of [employer] or Organizations: No   Attends Archivist Meetings: Never   Marital Status: Widowed  Human resources officer Violence: Not At Risk   Fear of Current or Ex-Partner: No   Emotionally Abused: No   Physically Abused: No   Sexually Abused: No    Outpatient Medications Prior to Visit  Medication Sig Dispense Refill   acetaminophen (TYLENOL) 500 MG tablet Take 1 tablet (500 mg total) by mouth every 6 (six) hours as needed. 30 tablet 0   albuterol (PROAIR HFA) 108 (90 Base) MCG/ACT inhaler Inhale 2 puffs into the lungs every 6 (six) hours as needed for  wheezing or shortness of breath. 1 each 3   aspirin 81 MG tablet Take 1 tablet (81 mg total) by mouth daily with breakfast. 90 tablet 0   atorvastatin (LIPITOR) 20 MG tablet Take 1 tablet (20 mg total) by mouth daily at 6 PM. 90 tablet 3   Clobetasol  Prop Emollient Base (CLOBETASOL PROPIONATE E) 0.05 % emollient cream Apply 1 application topically daily as needed (skin irritations). 30 g 2   furosemide (LASIX) 40 MG tablet TAKE 1 TAB DAILY, TAKE AN ADDITIONAL TAB AT 2 PM IF INCREASED SWELLING AND/OR SHORTNESS OF BREATH 180 tablet 2   gabapentin (NEURONTIN) 100 MG capsule Take 1 capsule (100 mg total) by mouth 2 (two) times daily. 180 capsule 1   hydrALAZINE (APRESOLINE) 25 MG tablet Take 1 tablet (25 mg total) by mouth 3 (three) times daily. 270 tablet 3   lisinopril (ZESTRIL) 20 MG tablet TAKE ONE TABLET BY MOUTH ONCE DAILY WITH BREAKFAST 90 tablet 3   mometasone (NASONEX) 50 MCG/ACT nasal spray Place 2 sprays into the nose daily. 17 g 11   montelukast (SINGULAIR) 10 MG tablet Take 1 tablet (10 mg total) by mouth at bedtime. 90 tablet 3   vitamin C (ASCORBIC ACID) 500 MG tablet Take 500 mg by mouth daily.      carvedilol (COREG) 25 MG tablet Take 1 tablet (25 mg total) by mouth 2 (two) times daily. 180 tablet 3   Semaglutide,0.25 or 0.5MG /DOS, (OZEMPIC, 0.25 OR 0.5 MG/DOSE,) 2 MG/1.5ML SOPN Inject 0.25 mg into the skin once a week. 1.5 mL 0   Calcium Carbonate-Vitamin D 600-400 MG-UNIT tablet Take 1 tablet by mouth daily. (Patient not taking: Reported on 08/14/2020)     No facility-administered medications prior to visit.    No Known Allergies  ROS Review of Systems  Constitutional:  Negative for diaphoresis, fatigue, fever and unexpected weight change.  HENT: Negative.    Eyes:  Negative for photophobia and visual disturbance.  Respiratory:  Negative for cough and wheezing.   Cardiovascular:  Negative for chest pain, palpitations and leg swelling.  Musculoskeletal:  Negative for gait  problem and joint swelling.  Skin:  Negative for rash.  Neurological:  Negative for speech difficulty and weakness.  Psychiatric/Behavioral: Negative.       Objective:    Physical Exam Vitals and nursing note reviewed.  Constitutional:      General: She is not in acute distress.    Appearance: Normal appearance. She is obese. She is not ill-appearing, toxic-appearing or diaphoretic.  HENT:     Head: Normocephalic and atraumatic.     Right Ear: Tympanic membrane, ear canal and external ear normal.     Left Ear: Tympanic membrane, ear canal and external ear normal.     Mouth/Throat:     Mouth: Mucous membranes are moist.     Pharynx: Oropharynx is clear. No oropharyngeal exudate or posterior oropharyngeal erythema.  Eyes:     General: No scleral icterus.       Right eye: No discharge.        Left eye: No discharge.     Extraocular Movements: Extraocular movements intact.     Conjunctiva/sclera: Conjunctivae normal.     Pupils: Pupils are equal, round, and reactive to light.  Cardiovascular:     Rate and Rhythm: Normal rate and regular rhythm.  Pulmonary:     Effort: Pulmonary effort is normal. No respiratory distress.     Breath sounds: Normal breath sounds. No stridor. No wheezing, rhonchi or rales.  Abdominal:     General: Bowel sounds are normal.  Musculoskeletal:     Cervical back: No rigidity or tenderness.  Lymphadenopathy:     Cervical: No cervical adenopathy.  Skin:    General: Skin is warm and dry.  Neurological:  Mental Status: She is alert and oriented to person, place, and time.  Psychiatric:        Mood and Affect: Mood normal.        Behavior: Behavior normal.    BP 122/64   Pulse 60   Temp 98.5 F (36.9 C) (Oral)   Ht 5\' 6"  (1.676 m)   Wt (!) 347 lb 6.4 oz (157.6 kg)   BMI 56.07 kg/m  Wt Readings from Last 3 Encounters:  08/14/20 (!) 347 lb 6.4 oz (157.6 kg)  08/12/20 (!) 353 lb (160.1 kg)  07/01/20 (!) 346 lb (156.9 kg)     Health  Maintenance Due  Topic Date Due   Zoster Vaccines- Shingrix (1 of 2) Never done   OPHTHALMOLOGY EXAM  11/01/2019   COVID-19 Vaccine (4 - Booster for Pfizer series) 06/18/2020    There are no preventive care reminders to display for this patient.  Lab Results  Component Value Date   TSH 0.51 11/28/2018   Lab Results  Component Value Date   WBC 5.3 08/12/2020   HGB 10.9 (L) 08/12/2020   HCT 35.5 (L) 08/12/2020   MCV 90.6 08/12/2020   PLT 213 08/12/2020   Lab Results  Component Value Date   NA 137 08/12/2020   K 5.0 08/12/2020   CO2 27 08/12/2020   GLUCOSE 98 08/12/2020   BUN 17 08/12/2020   CREATININE 1.66 (H) 08/12/2020   BILITOT 0.6 08/12/2020   ALKPHOS 62 08/12/2020   AST 15 08/12/2020   ALT 14 08/12/2020   PROT 7.7 08/12/2020   ALBUMIN 3.4 (L) 08/12/2020   CALCIUM 9.5 08/12/2020   ANIONGAP 7 08/12/2020   GFR 29.58 (L) 05/21/2020   Lab Results  Component Value Date   CHOL 116 05/14/2020   Lab Results  Component Value Date   HDL 51.60 05/14/2020   Lab Results  Component Value Date   LDLCALC 52 05/14/2020   Lab Results  Component Value Date   TRIG 61.0 05/14/2020   Lab Results  Component Value Date   CHOLHDL 2 05/14/2020   Lab Results  Component Value Date   HGBA1C 6.1 05/14/2020      Assessment & Plan:   Problem List Items Addressed This Visit   None Visit Diagnoses     Stage 4 chronic kidney disease ([place])    -  [facility] discharge follow-up           No orders of the defined types were placed in this encounter.   Follow-up: Return if symptoms worsen or fail to improve, for And follow-up with cardiology and sleep doctor..  Status post febrile illness and appears to have a resolved.  She will let me know if she develops any cough with sputum or fever in the near future.  She has follow-up planned with cardiology.  [name], MD

## 2020-08-19 ENCOUNTER — Telehealth: Payer: Medicare Other

## 2020-08-19 DIAGNOSIS — G4733 Obstructive sleep apnea (adult) (pediatric): Secondary | ICD-10-CM | POA: Diagnosis not present

## 2020-08-19 NOTE — Chronic Care Management (AMB) (Signed)
  Care Management   Note  08/19/2020 Name: [name] MRN: [id number] DOB: 1951/02/01  Stacey Page is a 70 y.o. year old female who is a primary care patient of [name], [name], NP and is actively engaged with the care management team. I reached out to [name] by phone today to assist with re-scheduling an initial visit with the RN Case Manager  Follow up plan: Telephone appointment with care management team member scheduled for:09/16/2020  [facility]

## 2020-08-20 ENCOUNTER — Encounter: Payer: Self-pay | Admitting: Sports Medicine

## 2020-08-20 ENCOUNTER — Other Ambulatory Visit: Payer: Self-pay

## 2020-08-20 ENCOUNTER — Ambulatory Visit: Payer: Medicare Other | Admitting: Sports Medicine

## 2020-08-20 DIAGNOSIS — M2041 Other hammer toe(s) (acquired), right foot: Secondary | ICD-10-CM

## 2020-08-20 DIAGNOSIS — M2042 Other hammer toe(s) (acquired), left foot: Secondary | ICD-10-CM

## 2020-08-20 DIAGNOSIS — M79675 Pain in left toe(s): Secondary | ICD-10-CM | POA: Diagnosis not present

## 2020-08-20 DIAGNOSIS — M2142 Flat foot [pes planus] (acquired), left foot: Secondary | ICD-10-CM

## 2020-08-20 DIAGNOSIS — M2141 Flat foot [pes planus] (acquired), right foot: Secondary | ICD-10-CM | POA: Diagnosis not present

## 2020-08-20 DIAGNOSIS — M79674 Pain in right toe(s): Secondary | ICD-10-CM | POA: Diagnosis not present

## 2020-08-20 DIAGNOSIS — B351 Tinea unguium: Secondary | ICD-10-CM | POA: Diagnosis not present

## 2020-08-20 DIAGNOSIS — E1142 Type 2 diabetes mellitus with diabetic polyneuropathy: Secondary | ICD-10-CM

## 2020-08-20 MED ORDER — NEOMYCIN-POLYMYXIN-HC 3.5-10000-1 OT SOLN: OTIC | 0 refills | Status: DC

## 2020-08-20 NOTE — Progress Notes (Signed)
Subjective: Stacey Page is a 70 y.o. female patient with history of diabetes who presents to office today complaining of long,mildly painful nails and painful callus while ambulating in shoes; unable to trim. Patient states that the glucose reading this morning was not recorded but last A1c 6.1.  Patient reports that she was diagnosed with diabetes in 2010 and is on gabapentin for neuropathy pain.  Patient is assisted by sister who will also be seen at today's visit.  Patient Active Problem List   Diagnosis Date Noted   CKD (chronic kidney disease) stage 4, GFR 15-29 ml/min ([place]) 07/09/2019   Diabetes mellitus type 2 in obese ([place]) 07/02/2019   Polyneuropathy associated with underlying disease ([place]) 11/28/2018   Severe episode of recurrent major depressive disorder, without psychotic features ([place]) 11/28/2018   Type 2 diabetes mellitus with diabetic peripheral angiopathy without gangrene, without long-term current use of insulin ([place]) 11/28/2018   Poor short term memory 11/28/2018   Mild aortic stenosis by prior echocardiogram 11/06/2018   Symptomatic anemia 03/17/2016   S/P total knee arthroplasty, right 03/17/2016   Unilateral primary osteoarthritis, right knee    Hyperlipidemia LDL goal <70 12/15/2015   Pain in both lower extremities 11/19/2015   Numbness in both hands 08/13/2015   Panic attack 06/09/2015   Right knee pain 05/14/2015   Chronic renal insufficiency 09/16/2014   Pneumothorax on right    Atypical mycobacterium infection    ILD (interstitial lung disease) 2/2 MAI s/p med rxn    Morbid obesity ([place]) 04/03/2014   OSA (obstructive sleep apnea) 02/25/2014   Hypertensive heart disease with chronic diastolic congestive heart failure ([place]) 02/25/2014   DOE (dyspnea on exertion) 02/25/2014   Essential hypertension 02/25/2014   Current Outpatient Medications on File Prior to Visit  Medication Sig Dispense Refill   acetaminophen (TYLENOL) 500 MG tablet Take 1 tablet (500  mg total) by mouth every 6 (six) hours as needed. 30 tablet 0   albuterol (PROAIR HFA) 108 (90 Base) MCG/ACT inhaler Inhale 2 puffs into the lungs every 6 (six) hours as needed for wheezing or shortness of breath. 1 each 3   aspirin 81 MG tablet Take 1 tablet (81 mg total) by mouth daily with breakfast. 90 tablet 0   atorvastatin (LIPITOR) 20 MG tablet Take 1 tablet (20 mg total) by mouth daily at 6 PM. 90 tablet 3   carvedilol (COREG) 25 MG tablet Take 1 tablet (25 mg total) by mouth 2 (two) times daily. 180 tablet 3   Clobetasol Prop Emollient Base (CLOBETASOL PROPIONATE E) 0.05 % emollient cream Apply 1 application topically daily as needed (skin irritations). 30 g 2   furosemide (LASIX) 40 MG tablet TAKE 1 TAB DAILY, TAKE AN ADDITIONAL TAB AT 2 PM IF INCREASED SWELLING AND/OR SHORTNESS OF BREATH 180 tablet 2   gabapentin (NEURONTIN) 100 MG capsule Take 1 capsule (100 mg total) by mouth 2 (two) times daily. 180 capsule 1   hydrALAZINE (APRESOLINE) 25 MG tablet Take 1 tablet (25 mg total) by mouth 3 (three) times daily. 270 tablet 3   lisinopril (ZESTRIL) 20 MG tablet TAKE ONE TABLET BY MOUTH ONCE DAILY WITH BREAKFAST 90 tablet 3   mometasone (NASONEX) 50 MCG/ACT nasal spray Place 2 sprays into the nose daily. 17 g 11   montelukast (SINGULAIR) 10 MG tablet Take 1 tablet (10 mg total) by mouth at bedtime. 90 tablet 3   Semaglutide,0.25 or 0.5MG /DOS, (OZEMPIC, 0.25 OR 0.5 MG/DOSE,) 2 MG/1.5ML SOPN Inject 0.25 mg into  the skin once a week. 1.5 mL 0   vitamin C (ASCORBIC ACID) 500 MG tablet Take 500 mg by mouth daily.      No current facility-administered medications on file prior to visit.   No Known Allergies  Recent Results (from the past 2160 hour(s))  Resp Panel by RT-PCR (Flu A&B, Covid) Nasopharyngeal Swab     Status: None   Collection Time: 08/12/20  7:03 PM   Specimen: Nasopharyngeal Swab; Nasopharyngeal(NP) swabs in vial transport medium  Result Value Ref Range   SARS Coronavirus 2 by  RT PCR NEGATIVE NEGATIVE    Comment: (NOTE) SARS-CoV-2 target nucleic acids are NOT DETECTED.  The SARS-CoV-2 RNA is generally detectable in upper respiratory specimens during the acute phase of infection. The lowest concentration of SARS-CoV-2 viral copies this assay can detect is 138 copies/mL. A negative result does not preclude SARS-Cov-2 infection and should not be used as the sole basis for treatment or other patient management decisions. A negative result may occur with  improper specimen collection/handling, submission of specimen other than nasopharyngeal swab, presence of viral mutation(s) within the areas targeted by this assay, and inadequate number of viral copies(<138 copies/mL). A negative result must be combined with clinical observations, patient history, and epidemiological information. The expected result is Negative.  Fact Sheet for Patients:  EntrepreneurPulse.com.au  Fact Sheet for Healthcare Providers:  IncredibleEmployment.be  This test is no t yet approved or cleared by the [place] FDA and  has been authorized for detection and/or diagnosis of SARS-CoV-2 by FDA under an Emergency Use Authorization (EUA). This EUA will remain  in effect (meaning this test can be used) for the duration of the COVID-19 declaration under Section 564(b)(1) of the Act, 21 U.S.C.section 360bbb-3(b)(1), unless the authorization is terminated  or revoked sooner.       Influenza A by PCR NEGATIVE NEGATIVE   Influenza B by PCR NEGATIVE NEGATIVE    Comment: (NOTE) The Xpert Xpress SARS-CoV-2/FLU/RSV plus assay is intended as an aid in the diagnosis of influenza from Nasopharyngeal swab specimens and should not be used as a sole basis for treatment. Nasal washings and aspirates are unacceptable for Xpert Xpress SARS-CoV-2/FLU/RSV testing.  Fact Sheet for Patients: EntrepreneurPulse.com.au  Fact Sheet for Healthcare  Providers: IncredibleEmployment.be  This test is not yet approved or cleared by the [place] FDA and has been authorized for detection and/or diagnosis of SARS-CoV-2 by FDA under an Emergency Use Authorization (EUA). This EUA will remain in effect (meaning this test can be used) for the duration of the COVID-19 declaration under Section 564(b)(1) of the Act, 21 U.S.C. section 360bbb-3(b)(1), unless the authorization is terminated or revoked.  Performed at [facility], [place] [address] [place], [place] [address]   Brain natriuretic peptide     Status: Abnormal   Collection Time: 08/12/20  7:33 PM  Result Value Ref Range   B Natriuretic Peptide 374.8 (H) 0.0 - 100.0 pg/mL    Comment: Performed at [facility] [address] [place], [place] [address]  CBC with Differential     Status: Abnormal   Collection Time: 08/12/20  7:33 PM  Result Value Ref Range   WBC 5.3 4.0 - 10.5 K/uL   RBC 3.92 3.87 - 5.11 MIL/uL   Hemoglobin 10.9 (L) 12.0 - 15.0 g/dL   HCT 35.5 (L) 36.0 - 46.0 %   MCV 90.6 80.0 - 100.0 fL   MCH 27.8 26.0 - 34.0 pg   MCHC  30.7 30.0 - 36.0 g/dL   RDW 16.2 (H) 11.5 - 15.5 %   Platelets 213 150 - 400 K/uL   nRBC 0.0 0.0 - 0.2 %   Neutrophils Relative % 71 %   Neutro Abs 3.8 1.7 - 7.7 K/uL   Lymphocytes Relative 18 %   Lymphs Abs 1.0 0.7 - 4.0 K/uL   Monocytes Relative 9 %   Monocytes Absolute 0.5 0.1 - 1.0 K/uL   Eosinophils Relative 1 %   Eosinophils Absolute 0.0 0.0 - 0.5 K/uL   Basophils Relative 1 %   Basophils Absolute 0.0 0.0 - 0.1 K/uL   Immature Granulocytes 0 %   Abs Immature Granulocytes 0.02 0.00 - 0.07 K/uL    Comment: Performed at [facility] [address], [place], [place] [address]  Comprehensive metabolic panel     Status: Abnormal   Collection Time: 08/12/20  7:33 PM  Result Value Ref Range   Sodium 137 135 - 145 mmol/L   Potassium 5.0 3.5 - 5.1 mmol/L   Chloride 103 98 - 111 mmol/L   CO2 27 22 -  32 mmol/L   Glucose, Bld 98 70 - 99 mg/dL    Comment: Glucose reference range applies only to samples taken after fasting for at least 8 hours.   BUN 17 8 - 23 mg/dL   Creatinine, Ser 1.66 (H) 0.44 - 1.00 mg/dL   Calcium 9.5 8.9 - 10.3 mg/dL   Total Protein 7.7 6.5 - 8.1 g/dL   Albumin 3.4 (L) 3.5 - 5.0 g/dL   AST 15 15 - 41 U/L   ALT 14 0 - 44 U/L   Alkaline Phosphatase 62 38 - 126 U/L   Total Bilirubin 0.6 0.3 - 1.2 mg/dL   GFR, Estimated 33 (L) >60 mL/min    Comment: (NOTE) Calculated using the CKD-EPI Creatinine Equation (2021)    Anion gap 7 5 - 15    Comment: Performed at [facility] [address], [place], [place] [address]    Objective: General: Patient is awake, alert, and oriented x 3 and in no acute distress.  Integument: Skin is warm, dry and supple bilateral. Nails are tender, long, thickened and dystrophic with subungual debris, consistent with onychomycosis, 1-5 bilateral.  There is mild curvature of bilateral hallux nail left greater than right with no acute signs of infection patient does have a thicker nail on the left that is slightly is incurvated at the medial border with no redness swelling warmth active drainage or acute signs of infection. No open lesions or preulcerative lesions present bilateral. Remaining integument unremarkable.  Vasculature:  Dorsalis Pedis pulse 1/4 bilateral. Posterior Tibial pulse 1/4 bilateral.  Capillary fill time <3 sec 1-5 bilateral. Positive hair growth to the level of the digits. Temperature gradient within normal limits. No varicosities present bilateral. No edema present bilateral.   Neurology: The patient has intact sensation measured with a 5.07/10g Semmes Weinstein Monofilament at all pedal sites bilateral . Vibratory sensation diminished bilateral with tuning fork. No Babinski sign present bilateral.   Musculoskeletal: Asymptomatic pes planus and lesser hammertoe pedal deformities noted bilateral. Muscular strength  5/5 in all lower extremity muscular groups bilateral without pain on range of motion . No tenderness with calf compression bilateral.  Assessment and Plan: Problem List Items Addressed This Visit   None Visit Diagnoses     Pain due to onychomycosis of toenails of both feet    -  Primary   Diabetic peripheral  neuropathy associated with type 2 diabetes mellitus (HCC)       Acquired hammertoes of both feet       Pes planus of both feet           -Examined patient. -Discussed and educated patient on diabetic foot care, especially with  regards to the vascular, neurological and musculoskeletal systems.  -Stressed the importance of good glycemic control and the detriment of not  controlling glucose levels in relation to the foot. -Mechanically debrided all nails 1-5 bilateral using sterile nail nipper and filed with dremel without incident and also removed offending borders at bilateral hallux nail at this time since there is no acute infection there is no acute need for a procedure for ingrown removal at this time.  However I did prescribe patient Corticosporin solution to use to her great toenails if the nail borders get sore before her next visit or if there is concern for swelling redness warmth drainage or signs of infection. -Answered all patient questions -Patient to return  in 3 months for at risk foot care and advised patient if routine trimming seems to help her ingrown's we will continue with this treatment -Patient advised to call the office if any problems or questions arise in the meantime.  Landis Martins, DPM

## 2020-08-26 ENCOUNTER — Ambulatory Visit (INDEPENDENT_AMBULATORY_CARE_PROVIDER_SITE_OTHER): Payer: Medicare Other | Admitting: Family Medicine

## 2020-08-26 ENCOUNTER — Encounter (INDEPENDENT_AMBULATORY_CARE_PROVIDER_SITE_OTHER): Payer: Self-pay | Admitting: Family Medicine

## 2020-08-26 ENCOUNTER — Other Ambulatory Visit: Payer: Self-pay

## 2020-08-26 VITALS — BP 153/67 | HR 68 | Temp 98.2°F | Ht 66.0 in | Wt 345.0 lb

## 2020-08-26 DIAGNOSIS — I10 Essential (primary) hypertension: Secondary | ICD-10-CM

## 2020-08-26 DIAGNOSIS — Z6841 Body Mass Index (BMI) 40.0 and over, adult: Secondary | ICD-10-CM

## 2020-09-02 ENCOUNTER — Encounter (HOSPITAL_BASED_OUTPATIENT_CLINIC_OR_DEPARTMENT_OTHER): Payer: Self-pay | Admitting: Family

## 2020-09-02 ENCOUNTER — Ambulatory Visit (HOSPITAL_BASED_OUTPATIENT_CLINIC_OR_DEPARTMENT_OTHER): Payer: Medicare Other | Admitting: Family

## 2020-09-02 ENCOUNTER — Other Ambulatory Visit: Payer: Self-pay

## 2020-09-02 VITALS — BP 122/62 | HR 61 | Ht 66.0 in | Wt 348.4 lb

## 2020-09-02 DIAGNOSIS — I10 Essential (primary) hypertension: Secondary | ICD-10-CM

## 2020-09-02 DIAGNOSIS — G4733 Obstructive sleep apnea (adult) (pediatric): Secondary | ICD-10-CM | POA: Diagnosis not present

## 2020-09-02 DIAGNOSIS — I110 Hypertensive heart disease with heart failure: Secondary | ICD-10-CM | POA: Diagnosis not present

## 2020-09-02 DIAGNOSIS — I5032 Chronic diastolic (congestive) heart failure: Secondary | ICD-10-CM | POA: Diagnosis not present

## 2020-09-02 DIAGNOSIS — E785 Hyperlipidemia, unspecified: Secondary | ICD-10-CM | POA: Diagnosis not present

## 2020-09-02 MED ORDER — FUROSEMIDE 40 MG PO TABS: ORAL_TABLET | ORAL | 2 refills | Status: DC

## 2020-09-02 NOTE — Progress Notes (Signed)
Office Visit    Patient Name: Stacey Page Date of Encounter: 09/02/2020  PCP:  Flossie Buffy, NP   [place]  Cardiologist:  Glenetta Hew, MD  Advanced Practice Provider:  No care team member to display Electrophysiologist:  None    Chief Complaint    [name] is a 70 y.o. female with a hx of HFpEF, hypertensive heart disease, OSA on CPAP, hyperlipidemia, DM2  presents today for ED visit follow up   Past Medical History    Past Medical History:  Diagnosis Date   Anxiety    doesn't take any meds   Arthritis of right knee 03/14/2016   Asthma    Back pain    Chronic diastolic CHF (congestive heart failure) ([place])    HF with Preserved EF (60-65%) - Grade II Diastolic Dysfunction (Hypertensive Heart Disease). takes Furosemide daily   COPD (chronic obstructive pulmonary disease) (H[facility])    Albuterol as needed   Depression    doesn't take meds   Diabetes ([place])    takes Januvia daily   Eczema    uses cream as needed   GERD (gastroesophageal reflux disease)    History of bronchitis as a child    HTN (hypertension)    Insomnia    Mild aortic stenosis by prior echocardiogram 04/2017   Mild stenosis: Mean gradient 15 mmHg, peak gradient 28 mmHg   OA (osteoarthritis)    Obese    OSA (obstructive sleep apnea) 02/25/2014   wears CPAP at night   Peripheral neuropathy    takes Gabapentin as needed   Pneumonia    hx of-2010   Seasonal allergies    uses Flonase daily   Sleep apnea    SOB (shortness of breath)    Past Surgical History:  Procedure Laterality Date   BREAST BIOPSY Right 03/2018   BREAST LUMPECTOMY WITH RADIOACTIVE SEED LOCALIZATION Right 12/28/2018   Procedure: RIGHT BREAST LUMPECTOMY WITH RADIOACTIVE SEED LOCALIZATION;  Surgeon: Jovita Kussmaul, MD;  Location: [facility];  Service: General;  Laterality: Right;   BREAST SURGERY     CESAREAN SECTION  x2   COLONOSCOPY     CORONARY CALCIUM SCORE & CT ANGIOGRAM  09/2017    Coronary Ca Score = 11 (low).  CTA- no obstructive CAD (minimal disease)   JOINT REPLACEMENT     KNEE ARTHROSCOPY Right    LUNG BIOPSY Right 06/10/2014   Procedure: LUNG BIOPSY;  Surgeon: Ivin Poot, MD;  Location: [facility];  Service: Thoracic;  Laterality: Right;   POLYPECTOMY     throat   TOTAL KNEE ARTHROPLASTY Right 03/14/2016   Procedure: RIGHT TOTAL KNEE ARTHROPLASTY;  Surgeon: Marybelle Killings, MD;  Location: [facility];  Service: Orthopedics;  Laterality: Right;   TRANSTHORACIC ECHOCARDIOGRAM  11/04/2019   EF 60-65%.  Moderate LVH.  GRII DD.  Mild LV dilation.  Mild LA dilation.  Thickened-calcified aortic valve-Mild AS (mean gradient 13 mmHg, peak 29 mmHg--similar to 2019)   [place] Right 06/10/2014   Procedure: VIDEO ASSISTED THORACOSCOPY;  Surgeon: Ivin Poot, MD;  Location: [facility];  Service: Thoracic;  Laterality: Right;    Allergies  No Known Allergies  History of Present Illness    Stacey Page is a 70 y.o. female with a hx of HFpEF, hypertensive heart disease, OSA on CPAP, hyperlipidemia, DM2  last seen 11/2019 by Dr. Ellyn Hack.  She follows routinely with Dr. Ellyn Hack in relation to her HFpEF and hypertensive heart  disease.  Most recent echocardiogram September 2021 with LVEF 60 to 65%, moderate LVH, grade 2 diastolic dysfunction, mild LV dilation, mild LA dilation, thickened calcified aortic valve with mild aortic stenosis (mean gradient 50 mmHg, peak gradient 29 mmHg).  She was seen in the ED 08/12/20 due to shortness of breath, headache, and fever. She left due to an extended weight. She was negative for COIVD and flu, BNP mildly elevated. CXR with cardiac enlargement and peribronchial changes consistent with edema or pneumonia.   She presents today for follow up. Notes fever has subsided. Her dyspnea on exertion is stable at her baseline. No formal exercise routine but does walk occasionally. Has difficulties with foot pain inhibiting exercise and has  established with podiatry. She has 29 and 65 year old grandsons she enjoys spending time with. She takes her Lasix 3 times per week on Monday, Wednesday, Friday. Tells me her nephrologist reduced it.  Reports no chest pain, pressure, or tightness. No  orthopnea, PND. Reports no palpitations.    EKGs/Labs/Other Studies Reviewed:   The following studies were reviewed today:   EKG:  No EKG today  Recent Labs: 08/12/2020: ALT 14; B Natriuretic Peptide 374.8; BUN 17; Creatinine, Ser 1.66; Hemoglobin 10.9; Platelets 213; Potassium 5.0; Sodium 137  Recent Lipid Panel    Component Value Date/Time   CHOL 116 05/14/2020 0910   CHOL 128 07/04/2019 1240   TRIG 61.0 05/14/2020 0910   HDL 51.60 05/14/2020 0910   HDL 61 07/04/2019 1240   CHOLHDL 2 05/14/2020 0910   VLDL 12.2 05/14/2020 0910   LDLCALC 52 05/14/2020 0910   LDLCALC 53 07/04/2019 1240    Home Medications   Current Meds  Medication Sig   acetaminophen (TYLENOL) 500 MG tablet Take 1 tablet (500 mg total) by mouth every 6 (six) hours as needed.   albuterol (PROAIR HFA) 108 (90 Base) MCG/ACT inhaler Inhale 2 puffs into the lungs every 6 (six) hours as needed for wheezing or shortness of breath.   aspirin 81 MG tablet Take 1 tablet (81 mg total) by mouth daily with breakfast.   atorvastatin (LIPITOR) 20 MG tablet Take 1 tablet (20 mg total) by mouth daily at 6 PM.   carvedilol (COREG) 25 MG tablet Take 1 tablet (25 mg total) by mouth 2 (two) times daily.   Clobetasol Prop Emollient Base (CLOBETASOL PROPIONATE E) 0.05 % emollient cream Apply 1 application topically daily as needed (skin irritations).   furosemide (LASIX) 40 MG tablet TAKE 1 TAB DAILY, TAKE AN ADDITIONAL TAB AT 2 PM IF INCREASED SWELLING AND/OR SHORTNESS OF BREATH   gabapentin (NEURONTIN) 100 MG capsule Take 1 capsule (100 mg total) by mouth 2 (two) times daily.   hydrALAZINE (APRESOLINE) 25 MG tablet Take 1 tablet (25 mg total) by mouth 3 (three) times daily.   lisinopril  (ZESTRIL) 20 MG tablet TAKE ONE TABLET BY MOUTH ONCE DAILY WITH BREAKFAST   mometasone (NASONEX) 50 MCG/ACT nasal spray Place 2 sprays into the nose daily.   montelukast (SINGULAIR) 10 MG tablet Take 1 tablet (10 mg total) by mouth at bedtime.   vitamin C (ASCORBIC ACID) 500 MG tablet Take 500 mg by mouth daily.      Review of Systems      All other systems reviewed and are otherwise negative except as noted above.  Physical Exam    VS:  BP 122/62   Pulse 61   Ht 5\' 6"  (1.676 m)   Wt (!) 348 lb 6.4 oz (  158 kg)   SpO2 98%   BMI 56.23 kg/m  , BMI Body mass index is 56.23 kg/m.  Wt Readings from Last 3 Encounters:  09/02/20 (!) 348 lb 6.4 oz (158 kg)  08/26/20 (!) 345 lb (156.5 kg)  08/14/20 (!) 347 lb 6.4 oz (157.6 kg)    GEN: Well nourished, overweight, well developed, in no acute distress. HEENT: normal. Neck: Supple, no JVD, carotid bruits, or masses. Cardiac: RRR, Gr 2/6 systolic murmur, no  rubs, or gallops. No clubbing, cyanosis, edema.  Radials/PT 2+ and equal bilaterally.  Respiratory:  Respirations regular and unlabored, clear to auscultation bilaterally. GI: Soft, nontender, nondistended. MS: No deformity or atrophy. Skin: Warm and dry, no rash. Neuro:  Strength and sensation are intact. Psych: Normal affect.  Assessment & Plan    HFpEF / Hypertensive heart disease - Volume status difficult to ascertain due to body habitus. Taking Lasix only 3 times per week at direction of her nephrologist. Given elevated BNP at ED visit 08/12/20, Recommend taking Lasix 4 times per week with additional dose as needed for weight gain of 2 pounds overnight or 5 pounds in 1 week. Daily weights encouraged.. Low salt diet, fluid restriction encouraged.   Mild aortic stenosis - Echo 10/2019 mild aortic stenosis. Stable gr 2/6 murmur on exam. Stable dyspnea on exertion. No syncope no chest pain. Could consider repeat echo at follow up.   HTN - BP well controlled. Continue current  antihypertensive regimen.    HLD - Continue Atorvastatin 20mg  daily.   OSA on CPAP - CPAP compliance encouraged.   Obesity - Weight loss via diet and exercise encouraged. Discussed the impact being overweight would have on cardiovascular risk.  Provided information on PREP program at [facility]. She wishes to await participation until after her foot is improved. Provided handout today.   Disposition: Follow up in 6 month(s) with Dr. Ellyn Hack or APP.  Signed, Loel Dubonnet, NP 09/02/2020, 7:53 AM [place]

## 2020-09-02 NOTE — Patient Instructions (Addendum)
Medication Instructions:  Your physician has recommended you make the following change in your medication:   CHANGE your Furosemide (Lasix) to 4 days per week  *If you need a refill on your cardiac medications before your next appointment, please call your pharmacy*   Lab Work: None ordered today  Testing/Procedures: None ordered today.   Follow-Up: At [facility], you and your health needs are our priority.  As part of our continuing mission to provide you with exceptional heart care, we have created designated Provider Care Teams.  These Care Teams include your primary Cardiologist (physician) and Advanced Practice Providers (APPs -  Physician Assistants and Nurse Practitioners) who all work together to provide you with the care you need, when you need it.  We recommend signing up for the patient portal called "MyChart".  Sign up information is provided on this After Visit Summary.  MyChart is used to connect with patients for Virtual Visits (Telemedicine).  Patients are able to view lab/test results, encounter notes, upcoming appointments, etc.  Non-urgent messages can be sent to your provider as well.   To learn more about what you can do with MyChart, go to [email or web address].    Your next appointment:   6 month(s)   The format for your next appointment:   In Person  Provider:   You may see Glenetta Hew, MD or one of the following Advanced Practice Providers on your designated Care Team:   Rosaria Ferries, PA-C Caron Presume, PA-C Jory Sims, DNP, ANP   Other Instructions  Heart Healthy Diet Recommendations: A low-salt diet is recommended. Meats should be grilled, baked, or boiled. Avoid fried foods. Focus on lean protein sources like fish or chicken with vegetables and fruits. The American Heart Association is a Microbiologist!    Exercise recommendations: The American Heart Association recommends 150 minutes of moderate intensity exercise weekly. Try  30 minutes of moderate intensity exercise 4-5 times per week. This could include walking, jogging, or swimming.  Recommend weighing daily and keeping a log. If you gain 2 pounds overnight or 5 pounds in 1 week - that is a day to take your Furosemide.  Date  Time Weight

## 2020-09-03 NOTE — Progress Notes (Signed)
Chief Complaint:   OBESITY Stacey Page is here to discuss her progress with her obesity treatment plan along with follow-up of her obesity related diagnoses. Stacey Page is on the Category 3 Plan and states she is following her eating plan approximately 10% of the time. Stacey Page states she is walking for 10 minutes 2-3 times per week.  Today's visit was #: 58 Starting weight: 344 lbs Starting date: 03/25/2019 Today's weight: 345 lbs Today's date: 08/26/2020 Total lbs lost to date: 0 Total lbs lost since last in-office visit: 2  Interim History: Stacey Page has been trying to eat healthier but she struggles to follow her plan closely. She is open to looking at other meal plan options.  Subjective:   1. Essential hypertension, benign Stacey Page's blood pressure is elevated today, but it has been better controlled in the past. No change in her medications recently. She may have had more Na+ in her diet the last couple of days.  Assessment/Plan:   1. Essential hypertension, benign Stacey Page will continue with diet, exercise, and weight loss to improve blood pressure control. We will recheck her blood pressure in 2 weeks, and she will continue to follow up as directed.  2. Obesity with current BMI 55.8 Stacey Page is currently in the action stage of change. As such, her goal is to continue with weight loss efforts. She has agreed to change to following a lower carbohydrate, vegetable and lean protein rich diet plan.   Exercise goals: As is.  Behavioral modification strategies: increasing lean protein intake and increasing water intake.  Stacey Page has agreed to follow-up with our clinic in 2 to 3 weeks. She was informed of the importance of frequent follow-up visits to maximize her success with intensive lifestyle modifications for her multiple health conditions.   Objective:   Blood pressure (!) 153/67, pulse 68, temperature 98.2 F (36.8 C), height 5\' 6"  (1.676 m), weight (!) 345 lb  (156.5 kg), SpO2 96 %. Body mass index is 55.68 kg/m.  General: Cooperative, alert, well developed, in no acute distress. HEENT: Conjunctivae and lids unremarkable. Cardiovascular: Regular rhythm.  Lungs: Normal work of breathing. Neurologic: No focal deficits.   Lab Results  Component Value Date   CREATININE 1.66 (H) 08/12/2020   BUN 17 08/12/2020   NA 137 08/12/2020   K 5.0 08/12/2020   CL 103 08/12/2020   CO2 27 08/12/2020   Lab Results  Component Value Date   ALT 14 08/12/2020   AST 15 08/12/2020   ALKPHOS 62 08/12/2020   BILITOT 0.6 08/12/2020   Lab Results  Component Value Date   HGBA1C 6.1 05/14/2020   HGBA1C 6.0 (H) 10/28/2019   HGBA1C 5.9 (H) 07/04/2019   HGBA1C 6.0 (H) 03/25/2019   HGBA1C 6.1 11/28/2018   Lab Results  Component Value Date   INSULIN 8.6 10/28/2019   INSULIN 9.2 07/04/2019   INSULIN 8.8 03/25/2019   Lab Results  Component Value Date   TSH 0.51 11/28/2018   Lab Results  Component Value Date   CHOL 116 05/14/2020   HDL 51.60 05/14/2020   LDLCALC 52 05/14/2020   TRIG 61.0 05/14/2020   CHOLHDL 2 05/14/2020   Lab Results  Component Value Date   VD25OH 57.6 10/28/2019   VD25OH 41.5 07/04/2019   VD25OH 38.2 03/25/2019   Lab Results  Component Value Date   WBC 5.3 08/12/2020   HGB 10.9 (L) 08/12/2020   HCT 35.5 (L) 08/12/2020   MCV 90.6 08/12/2020   PLT 213 08/12/2020  No results found for: IRON, TIBC, FERRITIN  Attestation Statements:   Reviewed by clinician on day of visit: allergies, medications, problem list, medical history, surgical history, family history, social history, and previous encounter notes.  Time spent on visit including pre-visit chart review and post-visit care and charting was 22 minutes.    I, Trixie Dredge, am acting as transcriptionist for Dennard Nip, MD.  I have reviewed the above documentation for accuracy and completeness, and I agree with the above. -  Dennard Nip, MD

## 2020-09-16 ENCOUNTER — Telehealth: Payer: Self-pay

## 2020-09-16 ENCOUNTER — Other Ambulatory Visit: Payer: Self-pay

## 2020-09-16 ENCOUNTER — Ambulatory Visit (INDEPENDENT_AMBULATORY_CARE_PROVIDER_SITE_OTHER): Payer: Medicare Other | Admitting: Bariatrics

## 2020-09-16 ENCOUNTER — Encounter (INDEPENDENT_AMBULATORY_CARE_PROVIDER_SITE_OTHER): Payer: Self-pay | Admitting: Bariatrics

## 2020-09-16 ENCOUNTER — Telehealth: Payer: Medicare Other

## 2020-09-16 VITALS — BP 130/82 | HR 65 | Temp 98.0°F

## 2020-09-16 DIAGNOSIS — E669 Obesity, unspecified: Secondary | ICD-10-CM | POA: Diagnosis not present

## 2020-09-16 DIAGNOSIS — E1169 Type 2 diabetes mellitus with other specified complication: Secondary | ICD-10-CM | POA: Diagnosis not present

## 2020-09-16 DIAGNOSIS — E7849 Other hyperlipidemia: Secondary | ICD-10-CM | POA: Diagnosis not present

## 2020-09-16 DIAGNOSIS — Z6841 Body Mass Index (BMI) 40.0 and over, adult: Secondary | ICD-10-CM

## 2020-09-16 NOTE — Telephone Encounter (Signed)
  Care Management   Follow Up Note   09/16/2020 Name: Stacey Page MRN: [id number] DOB: January 20, 1951   Referred by: Flossie Buffy, NP Reason for referral : Chronic Care Management (Initial assessment outreach )   An unsuccessful telephone outreach was attempted today. The patient was referred to the case management team for assistance with care management and care coordination.  Telephone call to patient x 2.  Unable to reach patient or leave voice message.  Phone only rang.   Follow Up Plan: Care guide will attempt outreach to patient to reschedule.   Quinn Plowman RN,BSN,CCM RN Case Manager [place]  [phone]

## 2020-09-16 NOTE — Progress Notes (Signed)
Chief Complaint:   OBESITY Stacey Page is here to discuss her progress with her obesity treatment plan along with follow-up of her obesity related diagnoses. [name] is on following a lower carbohydrate, vegetable and lean protein rich diet plan and states she is following her eating plan approximately 10% of the time. [name] states she is doing 0 minutes 0 times per week.  Today's visit was #: 53 Starting weight: 344 lbs Starting date: 03/25/2019 Today's weight: 345 lbs Today's date: 09/16/2020 Total lbs lost to date: 0 Total lbs lost since last in-office visit: 0  Interim History: Lamica's weight remains the same.  Subjective:   1. Diabetes mellitus type 2 in obese ([place]) Stacey Page was started on Ozempic but it is not covered. She is not on medications currently.  2. Other hyperlipidemia Stacey Page is taking Lipitor.  Assessment/Plan:   1. Diabetes mellitus type 2 in obese [facility]) We discussed Rybelsus and Trulicity, and Vidhi will check with her insurance about coverage. Good blood sugar control is important to decrease the likelihood of diabetic complications such as nephropathy, neuropathy, limb loss, blindness, coronary artery disease, and death. Intensive lifestyle modification including diet, exercise and weight loss are the first line of treatment for diabetes.   2. Other hyperlipidemia Cardiovascular risk and specific lipid/LDL goals reviewed. We discussed several lifestyle modifications today. Stacey Page will continue Lipitor, and will continue to work on diet, exercise and weight loss efforts. Orders and follow up as documented in patient record.   Counseling Intensive lifestyle modifications are the first line treatment for this issue. Dietary changes: Increase soluble fiber. Decrease simple carbohydrates. Exercise changes: Moderate to vigorous-intensity aerobic activity 150 minutes per week if tolerated. Lipid-lowering medications: see documented in medical  record.  3. Obesity with current BMI 55.7 Stacey Page is currently in the action stage of change. As such, her goal is to continue with weight loss efforts. She has agreed to the Category 1 Plan or following a lower carbohydrate, vegetable and lean protein rich diet plan.   Intentional eating was discussed.  Exercise goals: Increase exercise.  Behavioral modification strategies: increasing lean protein intake, decreasing simple carbohydrates, increasing vegetables, increasing water intake, decreasing eating out, no skipping meals, meal planning and cooking strategies, keeping healthy foods in the home, and planning for success.  Stacey Page has agreed to follow-up with our clinic in 4 weeks. She was informed of the importance of frequent follow-up visits to maximize her success with intensive lifestyle modifications for her multiple health conditions.   Objective:   Blood pressure 130/82, pulse 65, temperature 98 F (36.7 C), height (P) 5\' 6"  (1.676 m), weight (!) (P) 345 lb (156.5 kg), SpO2 92 %. Body mass index is 55.68 kg/m (pended).  General: Cooperative, alert, well developed, in no acute distress. HEENT: Conjunctivae and lids unremarkable. Cardiovascular: Regular rhythm.  Lungs: Normal work of breathing. Neurologic: No focal deficits.   Lab Results  Component Value Date   CREATININE 1.66 (H) 08/12/2020   BUN 17 08/12/2020   NA 137 08/12/2020   K 5.0 08/12/2020   CL 103 08/12/2020   CO2 27 08/12/2020   Lab Results  Component Value Date   ALT 14 08/12/2020   AST 15 08/12/2020   ALKPHOS 62 08/12/2020   BILITOT 0.6 08/12/2020   Lab Results  Component Value Date   HGBA1C 6.1 05/14/2020   HGBA1C 6.0 (H) 10/28/2019   HGBA1C 5.9 (H) 07/04/2019   HGBA1C 6.0 (H) 03/25/2019   HGBA1C 6.1 11/28/2018   Lab  Results  Component Value Date   INSULIN 8.6 10/28/2019   INSULIN 9.2 07/04/2019   INSULIN 8.8 03/25/2019   Lab Results  Component Value Date   TSH 0.51 11/28/2018    Lab Results  Component Value Date   CHOL 116 05/14/2020   HDL 51.60 05/14/2020   LDLCALC 52 05/14/2020   TRIG 61.0 05/14/2020   CHOLHDL 2 05/14/2020   Lab Results  Component Value Date   VD25OH 57.6 10/28/2019   VD25OH 41.5 07/04/2019   VD25OH 38.2 03/25/2019   Lab Results  Component Value Date   WBC 5.3 08/12/2020   HGB 10.9 (L) 08/12/2020   HCT 35.5 (L) 08/12/2020   MCV 90.6 08/12/2020   PLT 213 08/12/2020   No results found for: IRON, TIBC, FERRITIN  Obesity Behavioral Intervention:   Approximately 15 minutes were spent on the discussion below.  ASK: We discussed the diagnosis of obesity with Briceyda today and [name] agreed to give [name] permission to discuss obesity behavioral modification therapy today.  ASSESS: Stacey Page has the diagnosis of obesity and her BMI today is 55.71. Stacey Page is in the action stage of change.   ADVISE: Stacey Page was educated on the multiple health risks of obesity as well as the benefit of weight loss to improve her health. She was advised of the need for long term treatment and the importance of lifestyle modifications to improve her current health and to decrease her risk of future health problems.  AGREE: Multiple dietary modification options and treatment options were discussed and Stacey Page agreed to follow the recommendations documented in the above note.  ARRANGE: [name] was educated on the importance of frequent visits to treat obesity as outlined per CMS and USPSTF guidelines and agreed to schedule her next follow up appointment today.  Attestation Statements:   Reviewed by clinician on day of visit: allergies, medications, problem list, medical history, surgical history, family history, social history, and previous encounter notes.   Wilhemena Durie, am acting as Location manager for [employer], DO.  I have reviewed the above documentation for accuracy and completeness, and I agree with the above. Jearld Lesch,  DO

## 2020-09-22 ENCOUNTER — Ambulatory Visit (INDEPENDENT_AMBULATORY_CARE_PROVIDER_SITE_OTHER): Payer: Medicare Other | Admitting: Pulmonary Disease

## 2020-09-22 ENCOUNTER — Telehealth: Payer: Self-pay

## 2020-09-22 ENCOUNTER — Other Ambulatory Visit: Payer: Self-pay

## 2020-09-22 ENCOUNTER — Encounter: Payer: Self-pay | Admitting: Pulmonary Disease

## 2020-09-22 VITALS — BP 128/72 | HR 64 | Ht 66.0 in | Wt 345.0 lb

## 2020-09-22 DIAGNOSIS — J4530 Mild persistent asthma, uncomplicated: Secondary | ICD-10-CM | POA: Diagnosis not present

## 2020-09-22 DIAGNOSIS — G4730 Sleep apnea, unspecified: Secondary | ICD-10-CM | POA: Diagnosis not present

## 2020-09-22 DIAGNOSIS — E669 Obesity, unspecified: Secondary | ICD-10-CM

## 2020-09-22 DIAGNOSIS — G4733 Obstructive sleep apnea (adult) (pediatric): Secondary | ICD-10-CM

## 2020-09-22 DIAGNOSIS — Z8619 Personal history of other infectious and parasitic diseases: Secondary | ICD-10-CM

## 2020-09-22 NOTE — Patient Instructions (Signed)
Will arrange for new auto CPAP machine  Follow up in 6 months

## 2020-09-22 NOTE — Progress Notes (Signed)
Chronic Care Management Pharmacy Assistant   Name: Stacey Page  MRN: [id number] DOB: 1950/04/22  Reason for Encounter:Diabetes Disease State Call.   Recent office visits:  08/14/2020 Dr.Kremer MD (PCP) No Medication Changes noted. 08/04/2020 Leroy Kennedy LPN (PCP) Annual Wellness 05/14/2020 Wilfred Lacy NP (PCP) Decrease Januvia to 25 mg due to decline renal function,Decrease furosemide to 20 mg 02/13/2020 Wilfred Lacy NP (PCP) No Medication Changes noted   Recent consult visits:  09/16/2020 Jearld Lesch DO [facility]) No Medication Changes noted  09/02/2020 Laurann Montana NP (Cardiology)Recommend taking Lasix 4 times per week with additional dose as needed for weight gain of 2 pounds overnight or 5 pounds in 1 week. Daily weights encouraged, Follow up in 6 months with Cardiology. 08/26/2020 Dr.Caren MD (Family Medicine/ Weight management center)- No medication changes noted 08/20/2020 Landis Martins DPM (Podiatry) start Neomycin-Polymyxin-HC 3.5-10000-1 PRN 07/01/2020 Jearld Lesch DO [facility]) Ozempic stop due to cost,Increase Lasix to 1 tablet M/W/F 05/18/2020 Jearld Lesch DO (Bariatrics)Start Ozempic 0.25 mg weekly, stop Januvia  06/03/2020 Ila Mcgill DPM (Podiatry) No Medication Changes noted  05/18/2020 Jearld Lesch DO (Bariatrics) No Medication Changes noted  04/27/2020 Jearld Lesch DO (Bariatrics) No Medication Changes noted  04/08/2020 Jearld Lesch DO (Bariatrics) No Medication Changes noted  03/26/2020 Jearld Lesch DO (Bariatrics) No Medication Changes noted  03/10/2020 Jearld Lesch DO (Bariatrics) No Medication Changes noted  02/18/2020 Jearld Lesch DO [facility]) No Medication Changes noted   Hospital visits:  Medication Reconciliation was completed by comparing discharge summary, patient's EMR and Pharmacy list, and upon discussion with patient.  Admitted to the hospital on 08/12/2020 due to Shortness of breath. Discharge date was 08/13/2020. Discharged from  [place]?Medications Started at [facility] Discharge:?? -started none  Medication Changes at Hospital Discharge: -Changed none  Medications Discontinued at Hospital Discharge: -Stopped none  Medications that remain the same after Hospital Discharge:??  -All other medications will remain the same.    Medications: Outpatient Encounter Medications as of 09/22/2020  Medication Sig   acetaminophen (TYLENOL) 500 MG tablet Take 1 tablet (500 mg total) by mouth every 6 (six) hours as needed.   albuterol (PROAIR HFA) 108 (90 Base) MCG/ACT inhaler Inhale 2 puffs into the lungs every 6 (six) hours as needed for wheezing or shortness of breath.   aspirin 81 MG tablet Take 1 tablet (81 mg total) by mouth daily with breakfast.   atorvastatin (LIPITOR) 20 MG tablet Take 1 tablet (20 mg total) by mouth daily at 6 PM.   carvedilol (COREG) 25 MG tablet Take 1 tablet (25 mg total) by mouth 2 (two) times daily.   Clobetasol Prop Emollient Base (CLOBETASOL PROPIONATE E) 0.05 % emollient cream Apply 1 application topically daily as needed (skin irritations).   furosemide (LASIX) 40 MG tablet TAKE 1 TAB FOUR TIMES PER WEEK, TAKE AN ADDITIONAL TAB FOR WEIGHT GAIN OF 2 LBS OVERNIGHT OR 5 LBS IN ONE WEEK   gabapentin (NEURONTIN) 100 MG capsule Take 1 capsule (100 mg total) by mouth 2 (two) times daily.   hydrALAZINE (APRESOLINE) 25 MG tablet Take 1 tablet (25 mg total) by mouth 3 (three) times daily.   lisinopril (ZESTRIL) 20 MG tablet TAKE ONE TABLET BY MOUTH ONCE DAILY WITH BREAKFAST   mometasone (NASONEX) 50 MCG/ACT nasal spray Place 2 sprays into the nose daily.   montelukast (SINGULAIR) 10 MG tablet Take 1 tablet (10 mg total) by mouth at bedtime.   vitamin C (ASCORBIC ACID) 500 MG tablet Take  500 mg by mouth daily.    No facility-administered encounter medications on file as of 09/22/2020.    Care Gaps: Shingrix Vaccine Ophthalmology Exam COVID-19 Vaccine Fecal  DNA Influenza Vaccine Star Rating Drugs: Atorvastatin 20 mg last filled 06/29/2020 90 day supply at [facility]. Lisinopril 20 mg last filled 08/18/2020 90 day supply at [facility]. Medication Fill Gaps: Carvedilol 25 MG last filled 05/27/2020 90 day supply at [facility].  Recent Relevant Labs: Lab Results  Component Value Date/Time   HGBA1C 6.1 05/14/2020 09:10 AM   HGBA1C 6.0 (H) 10/28/2019 09:31 AM   MICROALBUR 1.4 05/14/2020 09:10 AM   MICROALBUR 1.9 01/16/2018 11:17 AM    Kidney Function Lab Results  Component Value Date/Time   CREATININE 1.66 (H) 08/12/2020 07:33 PM   CREATININE 1.75 (H) 05/21/2020 08:26 AM   CREATININE 1.36 (H) 03/19/2015 10:30 AM   CREATININE 1.42 (H) 12/16/2014 11:16 AM   GFR 29.58 (L) 05/21/2020 08:26 AM   GFRNONAA 33 (L) 08/12/2020 07:33 PM   GFRAA 40 (L) 10/28/2019 09:31 AM    Current antihyperglycemic regimen:  Januvia 25 mg 1 tablet daily What recent interventions/DTPs have been made to improve glycemic control:  None ID Have there been any recent hospitalizations or ED visits since last visit with CPP? Yes Patient denies hypoglycemic symptoms, including Pale, Sweaty, Shaky, Hungry, Nervous/irritable, and Vision changes Patient denies hyperglycemic symptoms, including blurry vision, excessive thirst, fatigue, polyuria, and weakness How often are you checking your blood sugar? Patient states she does not check her blood sugar at home. What are your blood sugars ranging?  None ID During the week, how often does your blood glucose drop below 70?  Patient states she does not check her blood sugars at home Are you checking your feet daily/regularly?   Patient reports she has little numbness in both feet.  Adherence Review: Is the patient currently on a STATIN medication? Yes Is the patient currently on ACE/ARB medication? Yes Does the patient have >5 day gap between last estimated fill dates? No  [name] Clinical  Production designer, theatre/television/film [phone]

## 2020-09-22 NOTE — Progress Notes (Signed)
[facility]  Chief Complaint  Patient presents with   Consult    OV to re-establish with VS. States she is still using [facility] as her DME.     Constitutional:  BP 128/72   Pulse 64   Ht 5\' 6"  (1.676 m)   Wt (!) 345 lb (156.5 kg)   SpO2 97% Comment: on RA  BMI 55.68 kg/m   Past Medical History:  Anxiety, OA, Diastolic CHF, Aortic stenosis, Depression, DM type 2, Eczema, GERD, HTN, Peripheral neuropathy  Past Surgical History:  She  has a past surgical history that includes Cesarean section (x2); Polypectomy; Knee arthroscopy (Right); Video assisted thoracoscopy (Right, 06/10/2014); Lung biopsy (Right, 06/10/2014); Colonoscopy; Total knee arthroplasty (Right, 03/14/2016); Joint replacement; transthoracic echocardiogram (11/04/2019); CORONARY CALCIUM SCORE & CT ANGIOGRAM (09/2017); Breast lumpectomy with radioactive seed localization (Right, 12/28/2018); Breast surgery; and Breast biopsy (Right, 03/2018).  Brief Summary:  Stacey Page is a 70 y.o. female former smoker with asthma, and OSA.  She has history of MAI.      Subjective:   I last saw her in 2019.  She uses CPAP nightly.  Has nasal pillow mask.  Has been getting mouth dryness.  Not having cough, wheeze, sputum, fever, hemoptysis, or chest pain.  Uses singulair and albuterol, and these control her symptoms.  Epworth score is 7 out of 24.  Chest xray from 08/12/20 showed mild cardiomegaly and interstitial thickening.  Physical Exam:   Appearance - well kempt   ENMT - no sinus tenderness, no oral exudate, no LAN, Mallampati 3 airway, no stridor  Respiratory - equal breath sounds bilaterally, no wheezing or rales  CV - s1s2 regular rate and rhythm, 2/6 SM  Ext - no clubbing, no edema  Skin - no rashes  Psych - normal mood and affect   Pulmonary testing:  PFT 03/12/14 >> FEV1 2.05 (88%), FEV1% 87, TLC 4.14 (75%), DLCO 52%, no BD VATS lung bx 06/10/14 >> bronchiolitis  with non necrotizing granuloma and AFB   Chest Imaging:  HRCT chest 03/20/14 >> multiple small nodules, patchy GGO, mild BTX, air trapping Cardiac CT 10/06/17 >> scattered areas of GGO w/o change from 2016  Sleep Tests:  HST 02/12/14 >> AHI 59.3, SaO2 low 67% Auto CPAP 04/21/20 to 05/20/20 >> uses on 30 of 30 nights with average 6 hrs 13 min.  Average AHI 3.4 with median CPAP 10 and 95 th percentile CPAP 12 cm H2O   Cardiac Tests:  Echo 11/04/19 >> EF 60 to 65%, mod LVH, grade 2 DD, mild MR, mild AS   Social History:  She  reports that she quit smoking about 40 years ago. Her smoking use included cigarettes. She has a 3.75 pack-year smoking history. She has quit using smokeless tobacco. She reports current alcohol use. She reports that she does not currently use drugs.  Family History:  Her family history includes Alcoholism in her father; Anemia in her father; COPD in her father; Cancer (age of onset: 75) in her mother; High Cholesterol in her mother; High blood pressure in her father and mother; Hypothyroidism in her father.     Assessment/Plan:   Obstructive sleep apnea. - she is compliant with CPAP and reports benefit from therapy - she uses [facility] for her DME - she is having mouth leak; will change her auto CPAP to 7 - 11 cm H2O; discussed adjusting humidifier on CPAP and might need mask refitting and/or chin strap - her current  CPAP is more than 69 yrs old; will arrange for new auto CPAP (okay for Resmed or Luna device)   Allergic asthma. - continue singulair with prn albuterol   Aortic stenosis, chronic diastolic CHF. - followed by Dr. Glenetta Hew with [place]  Obesity. - discussed importance of weight loss  Time Spent Involved in Patient Care on Day of Examination:  35 minutes  Follow up:   Patient Instructions  Will arrange for new auto CPAP machine  Follow up in 6 months  Medication List:   Allergies as of 09/22/2020   No Known Allergies       Medication List        Accurate as of September 22, 2020 10:09 AM. If you have any questions, ask your nurse or doctor.          acetaminophen 500 MG tablet Commonly known as: TYLENOL Take 1 tablet (500 mg total) by mouth every 6 (six) hours as needed.   albuterol 108 (90 Base) MCG/ACT inhaler Commonly known as: ProAir HFA Inhale 2 puffs into the lungs every 6 (six) hours as needed for wheezing or shortness of breath.   aspirin EC 81 MG tablet Take 1 tablet (81 mg total) by mouth daily with breakfast.   atorvastatin 20 MG tablet Commonly known as: LIPITOR Take 1 tablet (20 mg total) by mouth daily at 6 PM.   carvedilol 25 MG tablet Commonly known as: COREG Take 1 tablet (25 mg total) by mouth 2 (two) times daily.   Clobetasol Prop Emollient Base 0.05 % emollient cream Commonly known as: Clobetasol Propionate E Apply 1 application topically daily as needed (skin irritations).   furosemide 40 MG tablet Commonly known as: LASIX TAKE 1 TAB FOUR TIMES PER WEEK, TAKE AN ADDITIONAL TAB FOR WEIGHT GAIN OF 2 LBS OVERNIGHT OR 5 LBS IN ONE WEEK   gabapentin 100 MG capsule Commonly known as: NEURONTIN Take 1 capsule (100 mg total) by mouth 2 (two) times daily.   hydrALAZINE 25 MG tablet Commonly known as: APRESOLINE Take 1 tablet (25 mg total) by mouth 3 (three) times daily.   lisinopril 20 MG tablet Commonly known as: ZESTRIL TAKE ONE TABLET BY MOUTH ONCE DAILY WITH BREAKFAST   mometasone 50 MCG/ACT nasal spray Commonly known as: Nasonex Place 2 sprays into the nose daily.   montelukast 10 MG tablet Commonly known as: SINGULAIR Take 1 tablet (10 mg total) by mouth at bedtime.   vitamin C 500 MG tablet Commonly known as: ASCORBIC ACID Take 500 mg by mouth daily.        Signature:  Chesley Mires, MD [place] Pager - [phone] 09/22/2020, 10:09 AM

## 2020-09-24 ENCOUNTER — Encounter: Payer: Self-pay | Admitting: Nurse Practitioner

## 2020-09-24 ENCOUNTER — Other Ambulatory Visit: Payer: Self-pay

## 2020-09-24 ENCOUNTER — Ambulatory Visit (INDEPENDENT_AMBULATORY_CARE_PROVIDER_SITE_OTHER): Payer: Medicare Other | Admitting: Nurse Practitioner

## 2020-09-24 VITALS — BP 140/70 | HR 62 | Temp 97.0°F | Ht 68.0 in | Wt 352.5 lb

## 2020-09-24 DIAGNOSIS — M25551 Pain in right hip: Secondary | ICD-10-CM

## 2020-09-24 DIAGNOSIS — R0789 Other chest pain: Secondary | ICD-10-CM | POA: Diagnosis not present

## 2020-09-24 DIAGNOSIS — Z1211 Encounter for screening for malignant neoplasm of colon: Secondary | ICD-10-CM

## 2020-09-24 NOTE — Patient Instructions (Signed)
Use cold compress and tylenol as needed. Schedule annual diabetic eye exam and have report faxed to me.  Muscle Pain, Adult Muscle pain, also called myalgia, is a condition in which a person has pain in one or more muscles in the body. Muscle pain may be mild, moderate, or severe. It may feel sharp, achy, or burning. In most cases, the pain lasts only a shorttime and goes away without treatment. Muscle pain can result from using muscles in a new or different way or after a period of inactivity. It is normal to feel some muscle pain after starting anexercise program. Muscles that have not been used often will be sore at first. What are the causes? This condition is caused by using muscles in a new or different way after a period of inactivity. Other causes may include: Overuse or muscle strain, especially if you are not in shape. This is the most common cause of muscle pain. Injury or bruising. Infectious diseases, including diseases caused by viruses, such as the flu (influenza). Fibromyalgia.This is a long-term, or chronic, condition that causes muscle tenderness, tiredness (fatigue), and headache. Autoimmune or rheumatologic diseases. These are conditions, such as lupus, in which the body's defense system (immunesystem) attacks areas in the body. Certain medicines, including ACE inhibitors and statins. What are the signs or symptoms? The main symptom of this condition is sore or painful muscles, including duringactivity and when stretching. You may also have slight swelling. How is this diagnosed? This condition is diagnosed with a physical exam. Your health care provider will ask questions about your pain and when it began. If you have not had muscle pain for very long, your health care provider may want to wait before doing much testing. If your muscle pain has lasted a long time, tests may be done right away. In some cases, this may include tests to rule out certainconditions or illnesses. How  is this treated? Treatment for this condition depends on the cause. Home care is often enough to relieve muscle pain. Your health care provider may also prescribe NSAIDs, suchas ibuprofen. Follow these instructions at home: Medicines Take over-the-counter and prescription medicines only as told by your health care provider. Ask your health care provider if the medicine prescribed to you requires you to avoid driving or using machinery. Managing pain, swelling, and discomfort     If directed, put ice on the painful area. To do this: Put ice in a plastic bag. Place a towel between your skin and the bag. Leave the ice on for 20 minutes, 2-3 times a day. For the first 2 days of muscle soreness, or if there is swelling: Do not soak in hot baths. Do not use a hot tub, steam room, sauna, heating pad, or other heat source. After 48-72 hours, you may alternate between applying ice and applying heat as told by your health care provider. If directed, apply heat to the affected area as often as told by your health care provider. Use the heat source that your health care provider recommends, such as a moist heat pack or a heating pad. Place a towel between your skin and the heat source. Leave the heat on for 20-30 minutes. Remove the heat if your skin turns bright red. This is especially important if you are unable to feel pain, heat, or cold. You may have a greater risk of getting burned. If you have an injury, raise (elevate) the injured area above the level of your heart while you are sitting  or lying down. Activity  If overuse is causing your muscle pain: Slow down your activities until the pain goes away. Do regular, gentle exercises if you are not usually active. Warm up before exercising. Stretch before and after exercising. This can help lower the risk of muscle pain. Do not continue working out if the pain is severe. Severe pain could mean that you have injured a muscle. Do not lift anything  that is heavier than 5-10 lb (2.3-4.5 kg), or the limit that you are told, until your health care provider says that it is safe. Return to your normal activities as told by your health care provider. Ask your health care provider what activities are safe for you.  General instructions Do not use any products that contain nicotine or tobacco, such as cigarettes, e-cigarettes, and chewing tobacco. These can delay healing. If you need help quitting, ask your health care provider. Keep all follow-up visits as told by your health care provider. This is important. Contact a health care provider if you have: Muscle pain that gets worse and medicines do not help. Muscle pain that lasts longer than 3 days. A rash or fever along with muscle pain. Muscle pain after a tick bite. Muscle pain while working out, even though you are in good physical condition. Redness, soreness, or swelling along with muscle pain. Muscle pain after starting a new medicine or changing the dose of a medicine. Get help right away if you have: Trouble breathing. Trouble swallowing. Muscle pain along with a stiff neck, fever, and vomiting. Severe muscle weakness or you cannot move part of your body. These symptoms may represent a serious problem that is an emergency. Do not wait to see if the symptoms will go away. Get medical help right away. Call your local emergency services (911 in the [place] Do not drive yourself to the hospital. Summary Muscle pain usually lasts only a short time and goes away without treatment. This condition is caused by using muscles in a new or different way after a period of inactivity. If your muscle pain lasts longer than 3 days, tell your health care provider. This information is not intended to replace advice given to you by your health care provider. Make sure you discuss any questions you have with your healthcare provider. Document Revised: 11/02/2018 Document Reviewed: 11/02/2018 Elsevier  Patient Education  2022 [employer].

## 2020-09-24 NOTE — Progress Notes (Signed)
Subjective:  Patient ID: Stacey Page, female    DOB: 03-26-1950  Age: 70 y.o. MRN: [id number]  CC: Acute Visit (Pt c/o of lumps she found on her right side (breast, hip, and behind right side of neck). She noticed these lumps x 1-2 weeks, she states anytime she lays on that side of her body the areas are sensitive. )  HPI  Stacey Page presents with right chest wall and right hip pain x 1week. She denies any injury. She lays on her right side when sitting on her couch and in bed. Pain is worse when laying, and palpation.  Reviewed past Medical, Social and Family history today.  Outpatient Medications Prior to Visit  Medication Sig Dispense Refill   acetaminophen (TYLENOL) 500 MG tablet Take 1 tablet (500 mg total) by mouth every 6 (six) hours as needed. 30 tablet 0   albuterol (PROAIR HFA) 108 (90 Base) MCG/ACT inhaler Inhale 2 puffs into the lungs every 6 (six) hours as needed for wheezing or shortness of breath. 1 each 3   aspirin 81 MG tablet Take 1 tablet (81 mg total) by mouth daily with breakfast. 90 tablet 0   atorvastatin (LIPITOR) 20 MG tablet Take 1 tablet (20 mg total) by mouth daily at 6 PM. 90 tablet 3   Clobetasol Prop Emollient Base (CLOBETASOL PROPIONATE E) 0.05 % emollient cream Apply 1 application topically daily as needed (skin irritations). 30 g 2   furosemide (LASIX) 40 MG tablet TAKE 1 TAB FOUR TIMES PER WEEK, TAKE AN ADDITIONAL TAB FOR WEIGHT GAIN OF 2 LBS OVERNIGHT OR 5 LBS IN ONE WEEK 180 tablet 2   gabapentin (NEURONTIN) 100 MG capsule Take 1 capsule (100 mg total) by mouth 2 (two) times daily. 180 capsule 1   hydrALAZINE (APRESOLINE) 25 MG tablet Take 1 tablet (25 mg total) by mouth 3 (three) times daily. 270 tablet 3   lisinopril (ZESTRIL) 20 MG tablet TAKE ONE TABLET BY MOUTH ONCE DAILY WITH BREAKFAST 90 tablet 3   mometasone (NASONEX) 50 MCG/ACT nasal spray Place 2 sprays into the nose daily. 17 g 11   montelukast (SINGULAIR) 10 MG tablet Take 1 tablet (10 mg  total) by mouth at bedtime. 90 tablet 3   vitamin C (ASCORBIC ACID) 500 MG tablet Take 500 mg by mouth daily.      carvedilol (COREG) 25 MG tablet Take 1 tablet (25 mg total) by mouth 2 (two) times daily. 180 tablet 3   No facility-administered medications prior to visit.    ROS See HPI  Objective:  BP 140/70 (BP Location: Left Arm, Patient Position: Sitting, Cuff Size: Large)   Pulse 62   Temp (!) 97 F (36.1 C) (Temporal)   Ht 5\' 8"  (1.727 m)   Wt (!) 352 lb 8 oz (159.9 kg)   SpO2 96%   BMI 53.60 kg/m   Physical Exam Vitals reviewed.  Cardiovascular:     Rate and Rhythm: Normal rate.     Pulses: Normal pulses.  Pulmonary:     Effort: Pulmonary effort is normal.     Comments: Right lower chest wall tenderness with palpation, skin is dry and intact ( no rash, no erythema, no mass) Chest:     Chest wall: Tenderness present.  Musculoskeletal:     Cervical back: Normal range of motion.     Lumbar back: No spasms or tenderness.     Right hip: Tenderness present. No bony tenderness or crepitus. Normal range of motion. Normal strength.  Left hip: Normal.     Right upper leg: Normal.     Left upper leg: Normal.     Comments: Right lateral hip tenderness. No rash, no erythema.  Skin:    General: Skin is warm and dry.     Findings: No bruising, erythema, lesion or rash.  Neurological:     Mental Status: She is alert and oriented to person, place, and time.   Assessment & Plan:  This visit occurred during the SARS-CoV-2 public health emergency.  Safety protocols were in place, including screening questions prior to the visit, additional usage of staff PPE, and extensive cleaning of exam room while observing appropriate contact time as indicated for disinfecting solutions.   Stacey Page was seen today for acute visit.  Diagnoses and all orders for this visit:  Right-sided chest wall pain  Colon cancer screening -     Cologuard  Acute right hip pain Provided  reassurance. Symptoms due to muscle pain. Advised to avoid laying of right side, use tylenol and cold compress for pain. She is to return to office if develops rash or SOB or muscle weakness or difficulty with walking.  Problem List Items Addressed This Visit   None Visit Diagnoses     Right-sided chest wall pain    -  Primary   Colon cancer screening       Relevant Orders   Cologuard   Acute right hip pain           Follow-up: No follow-ups on file.  Wilfred Lacy, NP

## 2020-10-14 ENCOUNTER — Ambulatory Visit (INDEPENDENT_AMBULATORY_CARE_PROVIDER_SITE_OTHER): Payer: Medicare Other | Admitting: Bariatrics

## 2020-10-14 ENCOUNTER — Other Ambulatory Visit: Payer: Self-pay

## 2020-10-14 VITALS — BP 146/67 | HR 87 | Temp 97.9°F | Ht 68.0 in | Wt 347.0 lb

## 2020-10-14 DIAGNOSIS — Z6841 Body Mass Index (BMI) 40.0 and over, adult: Secondary | ICD-10-CM | POA: Diagnosis not present

## 2020-10-14 DIAGNOSIS — D631 Anemia in chronic kidney disease: Secondary | ICD-10-CM | POA: Diagnosis not present

## 2020-10-14 DIAGNOSIS — E1169 Type 2 diabetes mellitus with other specified complication: Secondary | ICD-10-CM | POA: Diagnosis not present

## 2020-10-14 DIAGNOSIS — E669 Obesity, unspecified: Secondary | ICD-10-CM

## 2020-10-14 DIAGNOSIS — E7849 Other hyperlipidemia: Secondary | ICD-10-CM

## 2020-10-14 DIAGNOSIS — N184 Chronic kidney disease, stage 4 (severe): Secondary | ICD-10-CM

## 2020-10-14 DIAGNOSIS — R6889 Other general symptoms and signs: Secondary | ICD-10-CM | POA: Diagnosis not present

## 2020-10-14 NOTE — Progress Notes (Signed)
Chief Complaint:   OBESITY Stacey Page is here to discuss her progress with her obesity treatment plan along with follow-up of her obesity related diagnoses. Stacey Page is on the Category 3 Plan and states she is following her eating plan approximately 0% of the time. Stacey Page states she is 0 0 minutes 0 times per week.  Today's visit was #: 28 Starting weight: 344 lbs Starting date: 03/25/2019 Today's weight: 347 lbs Today's date: 10/14/2020 Total lbs lost to date: 3 lbs Total lbs lost since last in-office visit: 0  Interim History: Stacey Page is up 2 lbs since her last visit.  Subjective:   1. Diabetes mellitus type 2 in obese [facility]) Stacey Page is currently not on medications. Her last A1C was 6.1.  2. Other hyperlipidemia Stacey Page is currently taking Lipitor.  3. Anemia due to stage 4 chronic kidney disease (HCC) Stacey Page will decrease H and H.  Assessment/Plan:   1. Diabetes mellitus type 2 in obese (HCC) Stacey Page will decrease carbohydrates. She will exercise and increase activities. Good blood sugar control is important to decrease the likelihood of diabetic complications such as nephropathy, neuropathy, limb loss, blindness, coronary artery disease, and death. Intensive lifestyle modification including diet, exercise and weight loss are the first line of treatment for diabetes.   - Hemoglobin A1c  - Comprehensive metabolic panel  2. Other hyperlipidemia Cardiovascular risk and specific lipid/LDL goals reviewed.  We discussed several lifestyle modifications today and Stacey Page will continue to work on diet, exercise and weight loss efforts. Stacey Page will continue taking Lipitor. Orders and follow up as documented in patient record.   Counseling Intensive lifestyle modifications are the first line treatment for this issue. Dietary changes: Increase soluble fiber. Decrease simple carbohydrates. Exercise changes: Moderate to vigorous-intensity aerobic activity 150  minutes per week if tolerated. Lipid-lowering medications: see documented in medical record.   3. Anemia due to stage 4 chronic kidney disease (Stacey Page) Orders and follow up as documented in patient record. We will check Anemia panel today.  Counseling Iron is essential for our bodies to make red blood cells.  Reasons that someone may be deficient include: an iron-deficient diet (more likely in those following vegan or vegetarian diets), women with heavy menses, patients with GI disorders or poor absorption, patients that have had bariatric surgery, frequent blood donors, patients with cancer, and patients with heart disease.   An iron supplement has been recommended. This is found over-the-counter.   Iron-rich foods include dark leafy greens, red and white meats, eggs, seafood, and beans.   Certain foods and drinks prevent your body from absorbing iron properly. Avoid eating these foods in the same meal as iron-rich foods or with iron supplements. These foods include: coffee, black tea, and red wine; milk, dairy products, and foods that are high in calcium; beans and soybeans; whole grains.  Constipation can be a side effect of iron supplementation. Increased water and fiber intake are helpful. Water goal: > 2 liters/day. Fiber goal: > 25 grams/day.  - Anemia panel  4. Obesity, current BMI 52.8 Stacey Page is currently in the action stage of change. As such, her goal is to continue with weight loss efforts. She has agreed to the Category 3 Plan.   Stacey Page will continue meal planning. She will continue intentional eating.  Exercise goals: She will increase walking. She will do chair exercises.   Behavioral modification strategies: distraction techniques when hungry.   Stacey Page has agreed to follow-up with our clinic in 3-4 weeks. She was informed of  the importance of frequent follow-up visits to maximize her success with intensive lifestyle modifications for her multiple health conditions.    Stacey Page was informed we would discuss her lab results at her next visit unless there is a critical issue that needs to be addressed sooner. Stacey Page agreed to keep her next visit at the agreed upon time to discuss these results.  Objective:   Blood pressure (!) 146/67, pulse 87, temperature 97.9 F (36.6 C), height 5\' 8"  (1.727 m), weight (!) 347 lb (157.4 kg), SpO2 97 %. Body mass index is 52.76 kg/m.  General: Cooperative, alert, well developed, in no acute distress. HEENT: Conjunctivae and lids unremarkable. Cardiovascular: Regular rhythm.  Lungs: Normal work of breathing. Neurologic: No focal deficits.   Lab Results  Component Value Date   CREATININE 1.66 (H) 08/12/2020   BUN 17 08/12/2020   NA 137 08/12/2020   K 5.0 08/12/2020   CL 103 08/12/2020   CO2 27 08/12/2020   Lab Results  Component Value Date   ALT 14 08/12/2020   AST 15 08/12/2020   ALKPHOS 62 08/12/2020   BILITOT 0.6 08/12/2020   Lab Results  Component Value Date   HGBA1C 6.1 05/14/2020   HGBA1C 6.0 (H) 10/28/2019   HGBA1C 5.9 (H) 07/04/2019   HGBA1C 6.0 (H) 03/25/2019   HGBA1C 6.1 11/28/2018   Lab Results  Component Value Date   INSULIN 8.6 10/28/2019   INSULIN 9.2 07/04/2019   INSULIN 8.8 03/25/2019   Lab Results  Component Value Date   TSH 0.51 11/28/2018   Lab Results  Component Value Date   CHOL 116 05/14/2020   HDL 51.60 05/14/2020   LDLCALC 52 05/14/2020   TRIG 61.0 05/14/2020   CHOLHDL 2 05/14/2020   Lab Results  Component Value Date   VD25OH 57.6 10/28/2019   VD25OH 41.5 07/04/2019   VD25OH 38.2 03/25/2019   Lab Results  Component Value Date   WBC 5.3 08/12/2020   HGB 10.9 (L) 08/12/2020   HCT 35.5 (L) 08/12/2020   MCV 90.6 08/12/2020   PLT 213 08/12/2020   No results found for: IRON, TIBC, FERRITIN  Obesity Behavioral Intervention:   Approximately 15 minutes were spent on the discussion below.  ASK: We discussed the diagnosis of obesity with Stacey Page today  and Stacey Page agreed to give [name] permission to discuss obesity behavioral modification therapy today.  ASSESS: Stacey Page has the diagnosis of obesity and her BMI today is 52.8. Stacey Page is in the action stage of change.   ADVISE: Kandie was educated on the multiple health risks of obesity as well as the benefit of weight loss to improve her health. She was advised of the need for long term treatment and the importance of lifestyle modifications to improve her current health and to decrease her risk of future health problems.  AGREE: Multiple dietary modification options and treatment options were discussed and Shakyla agreed to follow the recommendations documented in the above note.  ARRANGE: Rotunda was educated on the importance of frequent visits to treat obesity as outlined per CMS and USPSTF guidelines and agreed to schedule her next follow up appointment today.  Attestation Statements:   Reviewed by clinician on day of visit: allergies, medications, problem list, medical history, surgical history, family history, social history, and previous encounter notes.  I, Lizbeth Bark, RMA, am acting as Location manager for [employer], DO.   I have reviewed the above documentation for accuracy and completeness, and I agree with the above. Jearld Lesch,  DO

## 2020-10-15 ENCOUNTER — Encounter (INDEPENDENT_AMBULATORY_CARE_PROVIDER_SITE_OTHER): Payer: Self-pay | Admitting: Bariatrics

## 2020-10-15 LAB — ANEMIA PANEL
Ferritin: 27 ng/mL (ref 15–150)
Folate, Hemolysate: 389 ng/mL
Folate, RBC: 1182 ng/mL (ref >498)
Hematocrit: 32.9 % — ABNORMAL LOW (ref 34.0–46.6)
Iron Saturation: 12 % — ABNORMAL LOW (ref 15–55)
Iron: 38 ug/dL (ref 27–139)
Retic Ct Pct: 1.2 % (ref 0.6–2.6)
Total Iron Binding Capacity: 317 ug/dL (ref 250–450)
UIBC: 279 ug/dL (ref 118–369)
Vitamin B-12: 565 pg/mL (ref 232–1245)

## 2020-10-15 LAB — COMPREHENSIVE METABOLIC PANEL
ALT: 8 IU/L (ref 0–32)
AST: 12 IU/L (ref 0–40)
Albumin/Globulin Ratio: 1.2 (ref 1.2–2.2)
Albumin: 4.2 g/dL (ref 3.8–4.8)
Alkaline Phosphatase: 79 IU/L (ref 44–121)
BUN/Creatinine Ratio: 14 (ref 12–28)
BUN: 23 mg/dL (ref 8–27)
Bilirubin Total: 0.3 mg/dL (ref 0.0–1.2)
CO2: 24 mmol/L (ref 20–29)
Calcium: 9.5 mg/dL (ref 8.7–10.3)
Chloride: 101 mmol/L (ref 96–106)
Creatinine, Ser: 1.67 mg/dL — ABNORMAL HIGH (ref 0.57–1.00)
Globulin, Total: 3.4 g/dL (ref 1.5–4.5)
Glucose: 107 mg/dL — ABNORMAL HIGH (ref 65–99)
Potassium: 4.8 mmol/L (ref 3.5–5.2)
Sodium: 140 mmol/L (ref 134–144)
Total Protein: 7.6 g/dL (ref 6.0–8.5)
eGFR: 33 mL/min/{1.73_m2} — ABNORMAL LOW (ref >59)

## 2020-10-15 LAB — HEMOGLOBIN A1C
Est. average glucose Bld gHb Est-mCnc: 128 mg/dL
Hgb A1c MFr Bld: 6.1 % — ABNORMAL HIGH (ref 4.8–5.6)

## 2020-10-19 NOTE — Progress Notes (Signed)
Pt advised, and will discuss at there next office visit.

## 2020-10-21 ENCOUNTER — Ambulatory Visit (INDEPENDENT_AMBULATORY_CARE_PROVIDER_SITE_OTHER): Payer: Medicare Other

## 2020-10-21 ENCOUNTER — Encounter: Payer: Self-pay | Admitting: Nurse Practitioner

## 2020-10-21 DIAGNOSIS — I110 Hypertensive heart disease with heart failure: Secondary | ICD-10-CM

## 2020-10-21 DIAGNOSIS — N184 Chronic kidney disease, stage 4 (severe): Secondary | ICD-10-CM

## 2020-10-21 DIAGNOSIS — I10 Essential (primary) hypertension: Secondary | ICD-10-CM

## 2020-10-21 DIAGNOSIS — I5032 Chronic diastolic (congestive) heart failure: Secondary | ICD-10-CM

## 2020-10-21 NOTE — Chronic Care Management (AMB) (Signed)
Chronic Care Management   CCM RN Visit Note  10/21/2020 Name: Stacey Page MRN: [id number] DOB: May 24, 1950  Subjective: Stacey Page is a 70 y.o. year old female who is a primary care patient of [name], [name], NP. The care management team was consulted for assistance with disease management and care coordination needs.    Engaged with patient by telephone for initial visit in response to provider referral for case management and/or care coordination services.   Consent to Services:  The patient was given the following information about Chronic Care Management services today, agreed to services, and gave verbal consent: 1. CCM service includes personalized support from designated clinical staff supervised by the primary care provider, including individualized plan of care and coordination with other care providers 2. 24/7 contact phone numbers for assistance for urgent and routine care needs. 3. Service will only be billed when office clinical staff spend 20 minutes or more in a month to coordinate care. 4. Only one practitioner may furnish and bill the service in a calendar month. 5.The patient may stop CCM services at any time (effective at the end of the month) by phone call to the office staff. 6. The patient will be responsible for cost sharing (co-pay) of up to 20% of the service fee (after annual deductible is met). Patient agreed to services and consent obtained.  Patient agreed to services and verbal consent obtained.   Assessment: Review of patient past medical history, allergies, medications, health status, including review of consultants reports, laboratory and other test data, was performed as part of comprehensive evaluation and provision of chronic care management services.   SDOH (Social Determinants of Health) assessments and interventions performed:  SDOH Interventions    Flowsheet Row Most Recent Value  SDOH Interventions   Food Insecurity Interventions Other  (Comment)  [referred to care guide for food resource assistance.]  Transportation Interventions Intervention Not Indicated        CCM Care Plan  No Known Allergies  Outpatient Encounter Medications as of 10/21/2020  Medication Sig Note   acetaminophen (TYLENOL) 500 MG tablet Take 1 tablet (500 mg total) by mouth every 6 (six) hours as needed.    albuterol (PROAIR HFA) 108 (90 Base) MCG/ACT inhaler Inhale 2 puffs into the lungs every 6 (six) hours as needed for wheezing or shortness of breath.    aspirin 81 MG tablet Take 1 tablet (81 mg total) by mouth daily with breakfast.    atorvastatin (LIPITOR) 20 MG tablet Take 1 tablet (20 mg total) by mouth daily at 6 PM.    furosemide (LASIX) 40 MG tablet TAKE 1 TAB FOUR TIMES PER WEEK, TAKE AN ADDITIONAL TAB FOR WEIGHT GAIN OF 2 LBS OVERNIGHT OR 5 LBS IN ONE WEEK    gabapentin (NEURONTIN) 100 MG capsule Take 1 capsule (100 mg total) by mouth 2 (two) times daily.    hydrALAZINE (APRESOLINE) 25 MG tablet Take 1 tablet (25 mg total) by mouth 3 (three) times daily.    lisinopril (ZESTRIL) 20 MG tablet TAKE ONE TABLET BY MOUTH ONCE DAILY WITH BREAKFAST    mometasone (NASONEX) 50 MCG/ACT nasal spray Place 2 sprays into the nose daily.    montelukast (SINGULAIR) 10 MG tablet Take 1 tablet (10 mg total) by mouth at bedtime.    sitaGLIPtin (JANUVIA) 100 MG tablet Take 100 mg by mouth daily.    vitamin C (ASCORBIC ACID) 500 MG tablet Take 500 mg by mouth daily.     carvedilol (COREG) 25 MG  tablet Take 1 tablet (25 mg total) by mouth 2 (two) times daily. 10/21/2020: Patient states she is still taking   Clobetasol Prop Emollient Base (CLOBETASOL PROPIONATE E) 0.05 % emollient cream Apply 1 application topically daily as needed (skin irritations). (Patient not taking: Reported on 10/21/2020)    No facility-administered encounter medications on file as of 10/21/2020.    Patient Active Problem List   Diagnosis Date Noted   CKD (chronic kidney disease) stage 4,  GFR 15-29 ml/min ([place]) 07/09/2019   Diabetes mellitus type 2 in obese ([place]) 07/02/2019   Polyneuropathy associated with underlying disease ([place]) 11/28/2018   Severe episode of recurrent major depressive disorder, without psychotic features ([place]) 11/28/2018   Type 2 diabetes mellitus with diabetic peripheral angiopathy without gangrene, without long-term current use of insulin ([place]) 11/28/2018   Poor short term memory 11/28/2018   Mild aortic stenosis by prior echocardiogram 11/06/2018   Symptomatic anemia 03/17/2016   S/P total knee arthroplasty, right 03/17/2016   Unilateral primary osteoarthritis, right knee    Hyperlipidemia LDL goal <70 12/15/2015   Pain in both lower extremities 11/19/2015   Numbness in both hands 08/13/2015   Panic attack 06/09/2015   Right knee pain 05/14/2015   Chronic renal insufficiency 09/16/2014   Pneumothorax on right    Atypical mycobacterium infection    ILD (interstitial lung disease) 2/2 MAI s/p med rxn    Morbid obesity ([place]) 04/03/2014   OSA (obstructive sleep apnea) 02/25/2014   Hypertensive heart disease with chronic diastolic congestive heart failure ([place]) 02/25/2014   DOE (dyspnea on exertion) 02/25/2014   Essential hypertension 02/25/2014    Conditions to be addressed/monitored:CHF, HTN, and CKD Stage stage 4  Care Plan : Cardiovascular  Updates made by Dannielle Karvonen, RN since 10/21/2020 12:00 AM     Problem: Symptom Exacerbation (Heart Failure, Hypertension)   Priority: Medium     Long-Range Goal: Symptom Exacerbation Prevented or Minimized   Start Date: 10/21/2020  Expected End Date: 01/06/2021  This Visit's Progress: On track  Priority: Medium  Note:   Current Barriers:  Knowledge deficits related to long term care plan for self management of chronic health conditions:  Heart failure, Hypertension, CKD stage 4 :  Patient states she does not weigh daily or monitor blood pressure at home. She reports having scales but states she  does not use them.  She states she feels she is able to tell when her weight goes up or when she has swelling in her legs/ feet etc.  Patient states she has shortness of breath on a normal basis. She states she is able to afford her medication and takes her medications as prescribed.  Patient reports she is in a weight management program.  She confirms Hgt 5 ft 8 in and weight - 347 lbs at last weight management visit on 10/14/2020. Patient reports she is not adhering to her food plan on the program at this time.  She reports some food insecurity.  RN discussed and offered referral to care guide for Food assistance resources.   Patient verbalized agreement.  Patient states she does chair exercises at least 3 days per week.    She reports her last cardiology visit was 09/02/2020 and last primary care visit was 09/24/2020.    Financial strain:  Patient states food is becoming more difficulty to afford.  Nurse Case Manager Clinical Goal(s):  patient will weigh self daily and record patient will verbalize understanding of Heart Failure Action Plan and when  to call doctor patient will take  medications as prescribed Patient will follow up with her providers as recommended Patient will continue to exercise at least 3-5 days per week Patient will adhere to weight management food plan at least 25-50%.  Interventions:  Collaboration with [name], [name], NP regarding development and update of comprehensive plan of care as evidenced by provider attestation and co-signature Inter-disciplinary care team collaboration (see longitudinal plan of care) Basic overview and discussion Heart failure/ HTN.  Provided  verbal education on low sodium diet Reviewed Heart Failure Action Plan in depth and provided written copy Assessed for scales in home Discussed importance of daily weight Reviewed and discussed medications Provided written and verbal education on heart failure symptoms, Hypertension management, and  maintaining kidney health.  Patient Goals:  - Take  Medications as prescribed and refill timely - Weigh daily and record (notify MD with 3 lb weight gain over night or 5 lb in a week and/ or increase swelling and shortness of breath)  - Follow CHF Action Plan ( see education information sent to you in my chart) - Adhere to low sodium diet - Follow up with your provider as recommended - watch for swelling in feet, ankles and legs every day, Keep legs elevated when sitting if swelling.  Follow Up Plan: The patient has been provided with contact information for the care management team and has been advised to call with any health related questions or concerns.  The care management team will reach out to the patient again over the next 45 days.       Plan:The patient has been provided with contact information for the care management team and has been advised to call with any health related questions or concerns.  and The care management team will reach out to the patient again over the next 45 days. Quinn Plowman RN,BSN,CCM RN Case Manager [place]  [phone]

## 2020-10-21 NOTE — Patient Instructions (Signed)
Visit Information:  Thank you for taking the time to speak with me today   PATIENT GOALS:   Goals Addressed             This Visit's Progress    COMPLETED: Increase physical activity       Track and Manage Symptoms-Heart Failure, High blood pressure, Chronic kidney disease   On track    Timeframe:  Long-Range Goal Priority:  Medium Start Date:    10/21/2020                         Expected End Date:  01/06/2021                     Follow Up Date 11/25/2020   - Take  Medications as prescribed and refill timely - Weigh daily and record (notify MD with 3 lb weight gain over night or 5 lb in a week and/ or increase swelling and shortness of breath)  - Follow CHF Action Plan ( see education information sent to you in my chart) - Adhere to low sodium diet - Follow up with your provider as recommended - watch for swelling in feet, ankles and legs every day, Keep legs elevated when sitting if swelling.     Why is this important?   You will be able to handle your symptoms better if you keep track of them.  Making some simple changes to your lifestyle will help.  Eating healthy is one thing you can do to take good care of yourself.            Consent to CCM Services: Ms. Hammer was given information about Chronic Care Management services including:  CCM service includes personalized support from designated clinical staff supervised by her physician, including individualized plan of care and coordination with other care providers 24/7 contact phone numbers for assistance for urgent and routine care needs. Service will only be billed when office clinical staff spend 20 minutes or more in a month to coordinate care. Only one practitioner may furnish and bill the service in a calendar month. The patient may stop CCM services at any time (effective at the end of the month) by phone call to the office staff. The patient will be responsible for cost sharing (co-pay) of up to 20% of the  service fee (after annual deductible is met).  Patient agreed to services and verbal consent obtained.   Patient verbalizes understanding of instructions provided today and agrees to view in [place].   The patient has been provided with contact information for the care management team and has been advised to call with any health related questions or concerns.  The care management team will reach out to the patient again over the next 45 days.   Quinn Plowman RN,BSN,CCM RN Case Manager [place] [phone]   CLINICAL CARE PLAN: Patient Care Plan: Cardiovascular     Problem Identified: Symptom Exacerbation (Heart Failure, Hypertension)   Priority: Medium     Long-Range Goal: Symptom Exacerbation Prevented or Minimized   Start Date: 10/21/2020  Expected End Date: 01/06/2021  This Visit's Progress: On track  Priority: Medium  Note:   Current Barriers:  Knowledge deficits related to long term care plan for self management of chronic health conditions:  Heart failure, Hypertension, CKD stage 4 :  Patient states she does not weigh daily or monitor blood pressure at home. She reports having scales but states she  does not use them.  She states she feels she is able to tell when her weight goes up or when she has swelling in her legs/ feet etc.  Patient states she has shortness of breath on a normal basis. She states she is able to afford her medication and takes her medications as prescribed.  Patient reports she is in a weight management program.  She confirms Hgt 5 ft 8 in and weight - 347 lbs at last weight management visit on 10/14/2020. Patient reports she is not adhering to her food plan on the program at this time.  She reports some food insecurity.  RN discussed and offered referral to care guide for Food assistance resources.   Patient verbalized agreement.  Patient states she does chair exercises at least 3 days per week.    She reports her last cardiology visit was  09/02/2020 and last primary care visit was 09/24/2020.    Financial strain:  Patient states food is becoming more difficulty to afford.  Nurse Case Manager Clinical Goal(s):  patient will weigh self daily and record patient will verbalize understanding of Heart Failure Action Plan and when to call doctor patient will take  medications as prescribed Patient will follow up with her providers as recommended Patient will continue to exercise at least 3-5 days per week Patient will adhere to weight management food plan at least 25-50%.  Interventions:  Collaboration with [name], [name], NP regarding development and update of comprehensive plan of care as evidenced by provider attestation and co-signature Inter-disciplinary care team collaboration (see longitudinal plan of care) Basic overview and discussion Heart failure/ HTN.  Provided  verbal education on low sodium diet Reviewed Heart Failure Action Plan in depth and provided written copy Assessed for scales in home Discussed importance of daily weight Reviewed and discussed medications Provided written and verbal education on heart failure symptoms, Hypertension management, and maintaining kidney health.  Patient Goals:  - Take  Medications as prescribed and refill timely - Weigh daily and record (notify MD with 3 lb weight gain over night or 5 lb in a week and/ or increase swelling and shortness of breath)  - Follow CHF Action Plan ( see education information sent to you in my chart) - Adhere to low sodium diet - Follow up with your provider as recommended - watch for swelling in feet, ankles and legs every day, Keep legs elevated when sitting if swelling.  Follow Up Plan: The patient has been provided with contact information for the care management team and has been advised to call with any health related questions or concerns.  The care management team will reach out to the patient again over the next 45 days.

## 2020-10-22 ENCOUNTER — Telehealth: Payer: Self-pay

## 2020-10-22 NOTE — Telephone Encounter (Signed)
   Telephone encounter was:  Successful.  10/22/2020 Name: Stacey Page MRN: [id number] DOB: 02-Feb-1951  Stacey Page is a 70 y.o. year old female who is a primary care patient of [name], [name], NP . The community resource team was consulted for assistance with [place] guide performed the following interventions:  Patient is interested in food banks in her area. I will send a list of food banks to pt by mail.  Follow Up Plan:  Care guide will follow up with patient by phone over the next few days to ensure  she received mail.  [facility] management  [place], [place]  Main Phone: [phone]  E-mail: [email or web address]  Website: [email or web address]

## 2020-10-27 ENCOUNTER — Other Ambulatory Visit: Payer: Self-pay | Admitting: Nurse Practitioner

## 2020-10-27 DIAGNOSIS — G63 Polyneuropathy in diseases classified elsewhere: Secondary | ICD-10-CM

## 2020-10-27 DIAGNOSIS — G4733 Obstructive sleep apnea (adult) (pediatric): Secondary | ICD-10-CM | POA: Diagnosis not present

## 2020-10-28 ENCOUNTER — Telehealth: Payer: Self-pay

## 2020-10-28 NOTE — Telephone Encounter (Signed)
   Telephone encounter was:  Successful.  10/28/2020 Name: Daeja Helderman MRN: [id number] DOB: 1950-10-01  Shandel Busic is a 69 y.o. year old female who is a primary care patient of [name], [name], NP . The community resource team was consulted for assistance with [place] guide performed the following interventions:  Pt advised she could get someone to help her to access her e-mail as I advised out of the [employer] has food banks being held this week. She still wants me to send the rest of the food banks by mail which will be sent out today as well.  Follow Up Plan:  Care guide will follow up with patient by phone over the next few days.  [facility] management  [place], [place]  Main Phone: [phone]  E-mail: [email or web address]  Website: [email or web address]

## 2020-11-02 NOTE — Telephone Encounter (Signed)
Pt states she was informed she needed Iron infusions and would like to know what you think of this.

## 2020-11-04 ENCOUNTER — Telehealth: Payer: Self-pay

## 2020-11-04 NOTE — Telephone Encounter (Signed)
   Telephone encounter was:  Unsuccessful.  11/04/2020 Name: Nakkia Mackiewicz MRN: [id number] DOB: [birth year]  Unsuccessful outbound call made today to assist with:  Food Insecurity  Outreach Attempt:  1st Attempt  A HIPAA compliant voice message was left requesting a return call.  Instructed patient to call back at [phone] at their earliest convenience.  [facility] management  [place], [place]  Main Phone: [phone]  E-mail: [email or web address]  Website: [email or web address]

## 2020-11-05 ENCOUNTER — Telehealth: Payer: Self-pay

## 2020-11-05 LAB — HM DIABETES EYE EXAM

## 2020-11-05 NOTE — Telephone Encounter (Signed)
   Telephone encounter was:  Unsuccessful.  11/05/2020 Name: Stacey Page MRN: [id number] DOB: [birth year]  Unsuccessful outbound call made today to assist with:  Food Insecurity  Outreach Attempt:  2nd Attempt  A HIPAA compliant voice message was left requesting a return call.  Instructed patient to call back at / Unable to reach pt.  [place], [place]  Main Phone: [phone]  E-mail: [email or web address]  Website: [email or web address]

## 2020-11-06 DIAGNOSIS — I10 Essential (primary) hypertension: Secondary | ICD-10-CM

## 2020-11-06 DIAGNOSIS — I110 Hypertensive heart disease with heart failure: Secondary | ICD-10-CM | POA: Diagnosis not present

## 2020-11-06 DIAGNOSIS — I5032 Chronic diastolic (congestive) heart failure: Secondary | ICD-10-CM | POA: Diagnosis not present

## 2020-11-06 DIAGNOSIS — N184 Chronic kidney disease, stage 4 (severe): Secondary | ICD-10-CM | POA: Diagnosis not present

## 2020-11-09 NOTE — Telephone Encounter (Signed)
Pt states she is aware that iron tablets were recommended but she wanted to know if there was any benefit to iron infusions. Pt states she will just follow up with Dr. Owens Shark during her appointment on Wed.

## 2020-11-10 ENCOUNTER — Encounter: Payer: Self-pay | Admitting: Nurse Practitioner

## 2020-11-10 ENCOUNTER — Telehealth: Payer: Self-pay

## 2020-11-10 NOTE — Telephone Encounter (Signed)
   Telephone encounter was:  Unsuccessful.  11/10/2020 Name: Stacey Page MRN: [id number] DOB: [birth year]  Unsuccessful outbound call made today to assist with:  Food Insecurity  Outreach Attempt:  3rd Attempt.  Referral closed unable to contact patient.  A HIPAA compliant voice message was left requesting a return call.  Instructed patient to call back at [phone] at their earliest convenience.  3rd attempt: Unable to leave message as phone is busy. I have sent another e-mail out to patient as well. Food bank information has been sent by mail and e-mail.  [facility] management  [place], [place]  Main Phone: [phone]  E-mail: [email or web address]  Website: [email or web address]

## 2020-11-11 ENCOUNTER — Ambulatory Visit (INDEPENDENT_AMBULATORY_CARE_PROVIDER_SITE_OTHER): Payer: Medicare Other | Admitting: Bariatrics

## 2020-11-11 ENCOUNTER — Telehealth: Payer: Self-pay | Admitting: Hematology and Oncology

## 2020-11-11 ENCOUNTER — Other Ambulatory Visit: Payer: Self-pay

## 2020-11-11 VITALS — BP 103/76 | HR 81 | Temp 97.9°F | Ht 68.0 in | Wt 347.0 lb

## 2020-11-11 DIAGNOSIS — E669 Obesity, unspecified: Secondary | ICD-10-CM

## 2020-11-11 DIAGNOSIS — D631 Anemia in chronic kidney disease: Secondary | ICD-10-CM

## 2020-11-11 DIAGNOSIS — Z6841 Body Mass Index (BMI) 40.0 and over, adult: Secondary | ICD-10-CM

## 2020-11-11 DIAGNOSIS — N184 Chronic kidney disease, stage 4 (severe): Secondary | ICD-10-CM | POA: Diagnosis not present

## 2020-11-11 DIAGNOSIS — Z1211 Encounter for screening for malignant neoplasm of colon: Secondary | ICD-10-CM | POA: Diagnosis not present

## 2020-11-11 DIAGNOSIS — E1169 Type 2 diabetes mellitus with other specified complication: Secondary | ICD-10-CM

## 2020-11-11 NOTE — Telephone Encounter (Signed)
Scheduled appt per 10/5 referral. Pt is aware of appt date and time.  

## 2020-11-11 NOTE — Progress Notes (Signed)
Chief Complaint:   OBESITY Stacey Page is here to discuss her progress with her obesity treatment plan along with follow-up of her obesity related diagnoses. Stacey Page is on the Category 2 Plan and states she is following her eating plan approximately 20% of the time. Stacey Page states she is walking for 10 minutes 4 times per week.  Today's visit was #: 40 Starting weight: 344 lbs Starting date: 03/25/2019 Today's weight: 347 lbs Today's date: 11/11/2020 Total lbs lost to date: 0 Total lbs lost since last in-office visit: 0  Interim History: Stacey Page's weight remains the same.  Subjective:   1. Stage 4 chronic kidney disease (HCC) Stacey Page's GFR is stable. She is anemia. Her HCT has decreased. She is seeing a nephrologist.   2. Anemia due to stage 4 chronic kidney disease (Stacey Page) Stacey Page's anemia panel iron saturation was low. Her HCT was 32.9. She notes fatigue.   3. Diabetes mellitus type 2 in obese ([place]) Stacey Page is taking Januvia currently.   Assessment/Plan:   1. Stage 4 chronic kidney disease ([place]) Lab results and trends reviewed. We discussed several lifestyle modifications today and she will continue to work on diet, exercise and weight loss efforts. Avoid nephrotoxic medications. Dolce will follow up with nephrologist. Orders and follow up as documented in patient record.   Counseling Chronic kidney disease (CKD) happens when the kidneys are damaged over a long period of time. Most of the time, this condition does not go away, but it can usually be controlled. Steps must be taken to slow down the kidney damage or to stop it from getting worse. Intensive lifestyle modifications are the first line treatment for this issue.  Avoid buying foods that are: processed, frozen, or prepackaged to avoid excess salt.    2. Anemia due to stage 4 chronic kidney disease ([place]) Orders and follow up as documented in patient record. A referral to hematology for possible iron  infusion.  Counseling Iron is essential for our bodies to make red blood cells.  Reasons that someone may be deficient include: an iron-deficient diet (more likely in those following vegan or vegetarian diets), women with heavy menses, patients with GI disorders or poor absorption, patients that have had bariatric surgery, frequent blood donors, patients with cancer, and patients with heart disease.   An iron supplement has been recommended. This is found over-the-counter.   Iron-rich foods include dark leafy greens, red and white meats, eggs, seafood, and beans.   Certain foods and drinks prevent your body from absorbing iron properly. Avoid eating these foods in the same meal as iron-rich foods or with iron supplements. These foods include: coffee, black tea, and red wine; milk, dairy products, and foods that are high in calcium; beans and soybeans; whole grains.  Constipation can be a side effect of iron supplementation. Increased water and fiber intake are helpful. Water goal: > 2 liters/day. Fiber goal: > 25 grams/day.  - Ambulatory referral to Hematology / Oncology  3. Diabetes mellitus type 2 in obese ([place]) Stacey Page will continue Januvia. Good blood sugar control is important to decrease the likelihood of diabetic complications such as nephropathy, neuropathy, limb loss, blindness, coronary artery disease, and death. Intensive lifestyle modification including diet, exercise and weight loss are the first line of treatment for diabetes.   4. Obesity, current BMI 52.8 Stacey Page is currently in the action stage of change. As such, her goal is to continue with weight loss efforts. She has agreed to the Category 2 Plan.  Stacey Page will adhere closely to the plan (80-90%). She will continue meal planning. We will discuss labs from 10/14/2020.  Exercise goals:  As is.  Behavioral modification strategies: increasing lean protein intake, decreasing simple carbohydrates, increasing vegetables,  increasing water intake, decreasing eating out, no skipping meals, meal planning and cooking strategies, keeping healthy foods in the home, and planning for success.  Stacey Page has agreed to follow-up with our clinic in 4 weeks. She was informed of the importance of frequent follow-up visits to maximize her success with intensive lifestyle modifications for her multiple health conditions.   Objective:   Blood pressure 103/76, pulse 81, temperature 97.9 F (36.6 C), height 5\' 8"  (1.727 m), weight (!) 347 lb (157.4 kg), SpO2 92 %. Body mass index is 52.76 kg/m.  General: Cooperative, alert, well developed, in no acute distress. HEENT: Conjunctivae and lids unremarkable. Cardiovascular: Regular rhythm.  Lungs: Normal work of breathing. Neurologic: No focal deficits.   Lab Results  Component Value Date   CREATININE 1.67 (H) 10/14/2020   BUN 23 10/14/2020   NA 140 10/14/2020   K 4.8 10/14/2020   CL 101 10/14/2020   CO2 24 10/14/2020   Lab Results  Component Value Date   ALT 8 10/14/2020   AST 12 10/14/2020   ALKPHOS 79 10/14/2020   BILITOT 0.3 10/14/2020   Lab Results  Component Value Date   HGBA1C 6.1 (H) 10/14/2020   HGBA1C 6.1 05/14/2020   HGBA1C 6.0 (H) 10/28/2019   HGBA1C 5.9 (H) 07/04/2019   HGBA1C 6.0 (H) 03/25/2019   Lab Results  Component Value Date   INSULIN 8.6 10/28/2019   INSULIN 9.2 07/04/2019   INSULIN 8.8 03/25/2019   Lab Results  Component Value Date   TSH 0.51 11/28/2018   Lab Results  Component Value Date   CHOL 116 05/14/2020   HDL 51.60 05/14/2020   LDLCALC 52 05/14/2020   TRIG 61.0 05/14/2020   CHOLHDL 2 05/14/2020   Lab Results  Component Value Date   VD25OH 57.6 10/28/2019   VD25OH 41.5 07/04/2019   VD25OH 38.2 03/25/2019   Lab Results  Component Value Date   WBC 5.3 08/12/2020   HGB 10.9 (L) 08/12/2020   HCT 32.9 (L) 10/14/2020   MCV 90.6 08/12/2020   PLT 213 08/12/2020   Lab Results  Component Value Date   IRON 38  10/14/2020   TIBC 317 10/14/2020   FERRITIN 27 10/14/2020    Obesity Behavioral Intervention:   Approximately 15 minutes were spent on the discussion below.  ASK: We discussed the diagnosis of obesity with Stacey Page today and Stacey Page agreed to give [name] permission to discuss obesity behavioral modification therapy today.  ASSESS: Stacey Page has the diagnosis of obesity and her BMI today is 52.8. Stacey Page is in the action stage of change.   ADVISE: Stacey Page was educated on the multiple health risks of obesity as well as the benefit of weight loss to improve her health. She was advised of the need for long term treatment and the importance of lifestyle modifications to improve her current health and to decrease her risk of future health problems.  AGREE: Multiple dietary modification options and treatment options were discussed and Stacey Page agreed to follow the recommendations documented in the above note.  ARRANGE: Stacey Page was educated on the importance of frequent visits to treat obesity as outlined per CMS and USPSTF guidelines and agreed to schedule her next follow up appointment today.  Attestation Statements:   Reviewed by clinician on day of  visit: allergies, medications, problem list, medical history, surgical history, family history, social history, and previous encounter notes.  I, Lizbeth Bark, RMA, am acting as Location manager for [employer], DO.   I have reviewed the above documentation for accuracy and completeness, and I agree with the above. Jearld Lesch, DO

## 2020-11-12 ENCOUNTER — Encounter (INDEPENDENT_AMBULATORY_CARE_PROVIDER_SITE_OTHER): Payer: Self-pay | Admitting: Bariatrics

## 2020-11-16 ENCOUNTER — Inpatient Hospital Stay: Payer: Medicare Other | Attending: Hematology and Oncology | Admitting: Hematology and Oncology

## 2020-11-16 ENCOUNTER — Inpatient Hospital Stay: Payer: Medicare Other

## 2020-11-16 ENCOUNTER — Other Ambulatory Visit: Payer: Self-pay

## 2020-11-16 VITALS — BP 132/72 | HR 74 | Temp 97.2°F | Resp 17 | Wt 353.3 lb

## 2020-11-16 DIAGNOSIS — E669 Obesity, unspecified: Secondary | ICD-10-CM | POA: Diagnosis not present

## 2020-11-16 DIAGNOSIS — G4733 Obstructive sleep apnea (adult) (pediatric): Secondary | ICD-10-CM | POA: Diagnosis not present

## 2020-11-16 DIAGNOSIS — D509 Iron deficiency anemia, unspecified: Secondary | ICD-10-CM | POA: Diagnosis not present

## 2020-11-16 DIAGNOSIS — I5032 Chronic diastolic (congestive) heart failure: Secondary | ICD-10-CM | POA: Insufficient documentation

## 2020-11-16 DIAGNOSIS — Z87891 Personal history of nicotine dependence: Secondary | ICD-10-CM | POA: Diagnosis not present

## 2020-11-16 DIAGNOSIS — I130 Hypertensive heart and chronic kidney disease with heart failure and stage 1 through stage 4 chronic kidney disease, or unspecified chronic kidney disease: Secondary | ICD-10-CM | POA: Insufficient documentation

## 2020-11-16 DIAGNOSIS — K219 Gastro-esophageal reflux disease without esophagitis: Secondary | ICD-10-CM | POA: Insufficient documentation

## 2020-11-16 DIAGNOSIS — Z800 Family history of malignant neoplasm of digestive organs: Secondary | ICD-10-CM | POA: Diagnosis not present

## 2020-11-16 DIAGNOSIS — Z7984 Long term (current) use of oral hypoglycemic drugs: Secondary | ICD-10-CM | POA: Insufficient documentation

## 2020-11-16 DIAGNOSIS — E1122 Type 2 diabetes mellitus with diabetic chronic kidney disease: Secondary | ICD-10-CM | POA: Insufficient documentation

## 2020-11-16 NOTE — Progress Notes (Signed)
[facility] CONSULT NOTE  Patient Care Team: [name], [name], NP as PCP - General (Internal Medicine) Leonie Man, MD as PCP - Cardiology (Cardiology) Germaine Pomfret, [facility] as Pharmacist (Pharmacist) Dannielle Karvonen, RN as Case Manager  CHIEF COMPLAINTS/PURPOSE OF CONSULTATION:  IDA  ASSESSMENT & PLAN:  This is a very pleasant 70 year old female patient with type 2 diabetes, chronic kidney disease referred to hematology for evaluation and recommendations regarding iron deficiency anemia.  We have discussed the following details about iron deficiency anemia.  Iron deficiency anemia affects a large proportion of the wellness population especially females of childbearing age and children.  Common causes of iron deficiency include blood loss, reduced iron absorption because of previous surgeries, dietary restrictions or other malabsorption issues, medications that reduce gastric acidity are due to inherited disorders such as IRIDA due to TMPRSS6 mutation. Symptoms of iron deficiency usually include fatigue, pica, restless legs, exercise intolerance, exertional dyspnea, headaches and weakness. Diagnosis can be usually made by history, physical examination, CBC and iron studies.  We generally treat iron deficiency anemia with oral supplements if this can be tolerated.  IV iron is appropriate for patients are unable to tolerate due to gastrointestinal side effects or for patients with severe/ongoing blood loss, history of gastric bypass which reduces gastric acid and henceforth will impair intestinal absorption of oral iron, malabsorption syndromes are occasional in pregnancy.  Given mild anemia as well as iron deficiency, have recommended to consider oral iron trial for about 8 weeks, repeat labs and if she has no evidence of response then consider intravenous iron.  She is agreeable to these recommendations. She will return to clinic in 8 weeks with repeat  labs. Age-appropriate cancer screening recommended.   Thank you for consulting [name] in the care of this patient.  Please not hesitate to contact us with any additional questions or concerns.  HISTORY OF PRESENTING ILLNESS:  Stacey Page 69 y.o. female is here because of IDA  This is a very pleasant 70 year old female patient with past medical history significant for diabetes, chronic kidney disease, arthritis referred to hematology for evaluation and recommendations regarding iron deficiency anemia.  Patient arrived to the appointment today by herself.  She mostly complains of fatigue.  She does remember taking oral iron supplementation in the past but has not taken them recently.  Besides fatigue, she denies any blood loss in her stool or urine.  She has been postmenopausal.  No dietary restrictions.  No history of gastric bypass surgeries.  No family history of iron deficiency anemia.  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 Rest of the pertinent 10 point ROS reviewed and negative.  REVIEW OF SYSTEMS:   Constitutional: Denies fevers, chills or abnormal night sweats Eyes: Denies blurriness of vision, double vision or watery eyes Ears, nose, mouth, throat, and face: Denies mucositis or sore throat Respiratory: Denies cough, dyspnea or wheezes Cardiovascular: Denies palpitation, chest discomfort or lower extremity swelling Gastrointestinal:  Denies nausea, heartburn or change in bowel habits Skin: Denies abnormal skin rashes Lymphatics: Denies new lymphadenopathy or easy bruising Neurological:Denies numbness, tingling or new weaknesses Behavioral/Psych: Mood is stable, no new changes  All other systems were reviewed with the patient and are negative.  MEDICAL HISTORY:  Past Medical History:  Diagnosis Date   Anxiety    doesn't take any meds   Arthritis of right knee 03/14/2016   Asthma    Back pain    Chronic  diastolic CHF (congestive heart failure)  (HCC)    HF with Preserved EF (60-65%) - Grade II Diastolic Dysfunction (Hypertensive Heart Disease). takes Furosemide daily   Depression    doesn't take meds   Diabetes ([place])    takes Januvia daily   Eczema    uses cream as needed   GERD (gastroesophageal reflux disease)    History of bronchitis as a child    HTN (hypertension)    Insomnia    Mild aortic stenosis by prior echocardiogram 04/2017   Mild stenosis: Mean gradient 15 mmHg, peak gradient 28 mmHg   Mycobacterium avium-intracellulare infection ([place]) 2016   OA (osteoarthritis)    Obese    OSA (obstructive sleep apnea) 02/25/2014   wears CPAP at night   Peripheral neuropathy    takes Gabapentin as needed   Pneumonia    hx of-2010   Seasonal allergies    uses Flonase daily   Sleep apnea     SURGICAL HISTORY: Past Surgical History:  Procedure Laterality Date   BREAST BIOPSY Right 03/2018   BREAST LUMPECTOMY WITH RADIOACTIVE SEED LOCALIZATION Right 12/28/2018   Procedure: RIGHT BREAST LUMPECTOMY WITH RADIOACTIVE SEED LOCALIZATION;  Surgeon: Jovita Kussmaul, MD;  Location: [facility];  Service: General;  Laterality: Right;   BREAST SURGERY     CESAREAN SECTION  x2   COLONOSCOPY     CORONARY CALCIUM SCORE & CT ANGIOGRAM  09/2017   Coronary Ca Score = 11 (low).  CTA- no obstructive CAD (minimal disease)   JOINT REPLACEMENT     KNEE ARTHROSCOPY Right    LUNG BIOPSY Right 06/10/2014   Procedure: LUNG BIOPSY;  Surgeon: Ivin Poot, MD;  Location: [facility];  Service: Thoracic;  Laterality: Right;   POLYPECTOMY     throat   TOTAL KNEE ARTHROPLASTY Right 03/14/2016   Procedure: RIGHT TOTAL KNEE ARTHROPLASTY;  Surgeon: Marybelle Killings, MD;  Location: [facility];  Service: Orthopedics;  Laterality: Right;   TRANSTHORACIC ECHOCARDIOGRAM  11/04/2019   EF 60-65%.  Moderate LVH.  GRII DD.  Mild LV dilation.  Mild LA dilation.  Thickened-calcified aortic valve-Mild AS (mean gradient 13 mmHg, peak 29 mmHg--similar to 2019)   [place] Right 06/10/2014   Procedure: VIDEO ASSISTED THORACOSCOPY;  Surgeon: Ivin Poot, MD;  [facility] [place] [facility];  Service: Thoracic;  Laterality: Right;    SOCIAL HISTORY: Social History   Socioeconomic History   Marital status: Widowed    Spouse name: Not on file   Number of children: Not on file   Years of education: Not on file   Highest education level: Not on file  Occupational History   Occupation: retired  Tobacco Use   Smoking status: Former    Packs/day: 0.25    Years: 15.00    Pack years: 3.75    Types: Cigarettes    Quit date: 02/08/1980    Years since quitting: 40.8   Smokeless tobacco: Former  Scientific laboratory technician Use: Never used  Substance and Sexual Activity   Alcohol use: Yes    Alcohol/week: 0.0 standard drinks    Comment: occassional/social/rare   Drug use: Not Currently   Sexual activity: Not Currently  Other Topics Concern   Not on file  Social History Narrative   Not on file   Social Determinants of Health   Financial Resource Strain: Low Risk    Difficulty of Paying Living Expenses: Not hard at all  Food Insecurity: Food Insecurity Present  Worried About Charity fundraiser in the Last Year: Sometimes true   [employer] of Food in the Last Year: Sometimes true  Transportation Needs: No Transportation Needs   Lack of Transportation (Medical): No   Lack of Transportation (Non-Medical): No  Physical Activity: Inactive   Days of Exercise per Week: 0 days   Minutes of Exercise per Session: 0 min  Stress: No Stress Concern Present   Feeling of Stress : Not at all  Social Connections: Moderately Isolated   Frequency of Communication with Friends and Family: More than three times a week   Frequency of Social Gatherings with Friends and Family: Twice a week   Attends Religious Services: More than 4 times per year   Active Member of [employer] or Organizations: No   Attends Archivist Meetings: Never   Marital Status: Widowed  Arboriculturist Violence: Not At Risk   Fear of Current or Ex-Partner: No   Emotionally Abused: No   Physically Abused: No   Sexually Abused: No    FAMILY HISTORY: Family History  Problem Relation Age of Onset   COPD Father    Hypothyroidism Father    Anemia Father        iron deficiency   High blood pressure Father    Alcoholism Father    Cancer Mother 72       pancreatic   High blood pressure Mother    High Cholesterol Mother    Breast cancer Neg Hx     ALLERGIES:  has No Known Allergies.  MEDICATIONS:  Current Outpatient Medications  Medication Sig Dispense Refill   acetaminophen (TYLENOL) 500 MG tablet Take 1 tablet (500 mg total) by mouth every 6 (six) hours as needed. 30 tablet 0   albuterol (PROAIR HFA) 108 (90 Base) MCG/ACT inhaler Inhale 2 puffs into the lungs every 6 (six) hours as needed for wheezing or shortness of breath. 1 each 3   aspirin 81 MG tablet Take 1 tablet (81 mg total) by mouth daily with breakfast. 90 tablet 0   atorvastatin (LIPITOR) 20 MG tablet Take 1 tablet (20 mg total) by mouth daily at 6 PM. 90 tablet 3   Clobetasol Prop Emollient Base (CLOBETASOL PROPIONATE E) 0.05 % emollient cream Apply 1 application topically daily as needed (skin irritations). 30 g 2   furosemide (LASIX) 40 MG tablet TAKE 1 TAB FOUR TIMES PER WEEK, TAKE AN ADDITIONAL TAB FOR WEIGHT GAIN OF 2 LBS OVERNIGHT OR 5 LBS IN ONE WEEK 180 tablet 2   gabapentin (NEURONTIN) 100 MG capsule Take 1 capsule (100 mg total) by mouth 2 (two) times daily. 180 capsule 1   hydrALAZINE (APRESOLINE) 25 MG tablet Take 1 tablet (25 mg total) by mouth 3 (three) times daily. 270 tablet 3   lisinopril (ZESTRIL) 20 MG tablet TAKE ONE TABLET BY MOUTH ONCE DAILY WITH BREAKFAST 90 tablet 3   mometasone (NASONEX) 50 MCG/ACT nasal spray Place 2 sprays into the nose daily. 17 g 11   montelukast (SINGULAIR) 10 MG tablet Take 1 tablet (10 mg total) by mouth at bedtime. 90 tablet 3   sitaGLIPtin (JANUVIA) 100 MG tablet  Take 100 mg by mouth daily.     vitamin C (ASCORBIC ACID) 500 MG tablet Take 500 mg by mouth daily.      carvedilol (COREG) 25 MG tablet Take 1 tablet (25 mg total) by mouth 2 (two) times daily. 180 tablet 3   No current facility-administered medications for this visit.  PHYSICAL EXAMINATION: ECOG PERFORMANCE STATUS: 0 - Asymptomatic  Vitals:   11/16/20 1118  BP: 132/72  Pulse: 74  Resp: 17  Temp: (!) 97.2 F (36.2 C)  SpO2: 95%   Filed Weights   11/16/20 1118  Weight: (!) 353 lb 5 oz (160.3 kg)    GENERAL:alert, no distress and comfortable, obese. OROPHARYNX:no exudate, no erythema and lips, buccal mucosa, and tongue normal  NECK: supple, thyroid normal size, non-tender, without nodularity LYMPH:  no palpable lymphadenopathy in the cervical, axillary  LUNGS: clear to auscultation and percussion with normal breathing effort HEART: regular rate & rhythm and no murmurs and no lower extremity edema ABDOMEN:abdomen soft, non-tender and normal bowel sounds Musculoskeletal:no cyanosis of digits and no clubbing  PSYCH: alert & oriented x 3 with fluent speech NEURO: no focal motor/sensory deficits  LABORATORY DATA:  I have reviewed the data as listed Lab Results  Component Value Date   WBC 5.3 08/12/2020   HGB 10.9 (L) 08/12/2020   HCT 32.9 (L) 10/14/2020   MCV 90.6 08/12/2020   PLT 213 08/12/2020     Chemistry      Component Value Date/Time   NA 140 10/14/2020 1057   K 4.8 10/14/2020 1057   CL 101 10/14/2020 1057   CO2 24 10/14/2020 1057   BUN 23 10/14/2020 1057   CREATININE 1.67 (H) 10/14/2020 1057   CREATININE 1.36 (H) 03/19/2015 1030      Component Value Date/Time   CALCIUM 9.5 10/14/2020 1057   ALKPHOS 79 10/14/2020 1057   AST 12 10/14/2020 1057   ALT 8 10/14/2020 1057   BILITOT 0.3 10/14/2020 1057     I have reviewed pertinent labs with the patient today.  Most recent anemia panel from October 14, 2020 with a hematocrit of 32.9, normal E83 and folic  acid, ferritin of 27, iron saturation of 12%.  Reticulocyte count was 1.2%.  She most recently had Cologuard which was negative.  Comprehensive panel showed creatinine of 1.67, normal calcium and no evidence of transaminitis.  RADIOGRAPHIC STUDIES: I have personally reviewed the radiological images as listed and agreed with the findings in the report. No results found.  All questions were answered. The patient knows to call the clinic with any problems, questions or concerns. I spent 45 minutes in the care of this patient including H and P, review of records, counseling and coordination of care.     [name], MD 11/16/2020 11:35 AM

## 2020-11-17 ENCOUNTER — Encounter: Payer: Self-pay | Admitting: Hematology and Oncology

## 2020-11-17 LAB — COLOGUARD: Cologuard: NEGATIVE

## 2020-11-25 ENCOUNTER — Ambulatory Visit (INDEPENDENT_AMBULATORY_CARE_PROVIDER_SITE_OTHER): Payer: Medicare Other

## 2020-11-25 DIAGNOSIS — N184 Chronic kidney disease, stage 4 (severe): Secondary | ICD-10-CM

## 2020-11-25 DIAGNOSIS — I110 Hypertensive heart disease with heart failure: Secondary | ICD-10-CM

## 2020-11-25 DIAGNOSIS — I5032 Chronic diastolic (congestive) heart failure: Secondary | ICD-10-CM

## 2020-11-25 DIAGNOSIS — I10 Essential (primary) hypertension: Secondary | ICD-10-CM

## 2020-11-25 NOTE — Patient Instructions (Signed)
Visit Information:  Thank you for taking the time to speak with me today.  PATIENT GOALS:  Goals Addressed             This Visit's Progress    Track and Manage Symptoms-Heart Failure, High blood pressure, Chronic kidney disease   On track    Timeframe:  Long-Range Goal Priority:  Medium Start Date:    10/21/2020                         Expected End Date:  04/06/2020                  Follow Up Date  02/10/2021  - Continue to take your Medications as prescribed and refill timely - Consider weighing daily and recording (notify MD with 3 lb weight gain over night or 5 lb in a week and/ or increase swelling and shortness of breath, - watch for swelling in feet, ankles and legs every day, Keep legs elevated when sitting if swelling).  - Follow CHF Action Plan ( see education information sent to you in my chart) - Adhere to low sodium diet - Follow up with your provider as recommended. Notify provider of any new or ongoing symptoms - Maintain kidney health:  Make healthy food choices. Heart healthy, low salt Make physical activity part of your routine. ... Aim for a healthy weight. ... Get enough sleep. ... Limit alcohol intake link. ... Explore stress-reducing activities. ... Manage diabetes, high blood pressure, heart disease    Why is this important?   You will be able to handle your symptoms better if you keep track of them.  Making some simple changes to your lifestyle will help.  Eating healthy is one thing you can do to take good care of yourself.            Patient verbalizes understanding of instructions provided today and agrees to view in [place].   The patient has been provided with contact information for the care management team and has been advised to call with any health related questions or concerns.  The care management team will reach out to the patient again over the next 2-3 months   Quinn Plowman RN,BSN,CCM RN Case Manager [place] [phone]

## 2020-11-25 NOTE — Chronic Care Management (AMB) (Signed)
Chronic Care Management   CCM RN Visit Note  11/25/2020 Name: Stacey Page MRN: [id number] DOB: 1950-08-01  Subjective: Stacey Page is a 70 y.o. year old female who is a primary care patient of [name], [name], NP. The care management team was consulted for assistance with disease management and care coordination needs.    Engaged with patient by telephone for follow up visit in response to provider referral for case management and/or care coordination services.   Consent to Services:  The patient was given information about Chronic Care Management services, agreed to services, and gave verbal consent prior to initiation of services.  Please see initial visit note for detailed documentation.   Patient agreed to services and verbal consent obtained.   Assessment: Review of patient past medical history, allergies, medications, health status, including review of consultants reports, laboratory and other test data, was performed as part of comprehensive evaluation and provision of chronic care management services.   SDOH (Social Determinants of Health) assessments and interventions performed:    CCM Care Plan  No Known Allergies  Outpatient Encounter Medications as of 11/25/2020  Medication Sig Note   acetaminophen (TYLENOL) 500 MG tablet Take 1 tablet (500 mg total) by mouth every 6 (six) hours as needed.    albuterol (PROAIR HFA) 108 (90 Base) MCG/ACT inhaler Inhale 2 puffs into the lungs every 6 (six) hours as needed for wheezing or shortness of breath.    aspirin 81 MG tablet Take 1 tablet (81 mg total) by mouth daily with breakfast.    atorvastatin (LIPITOR) 20 MG tablet Take 1 tablet (20 mg total) by mouth daily at 6 PM.    Clobetasol Prop Emollient Base (CLOBETASOL PROPIONATE E) 0.05 % emollient cream Apply 1 application topically daily as needed (skin irritations).    furosemide (LASIX) 40 MG tablet TAKE 1 TAB FOUR TIMES PER WEEK, TAKE AN ADDITIONAL TAB FOR WEIGHT  GAIN OF 2 LBS OVERNIGHT OR 5 LBS IN ONE WEEK    gabapentin (NEURONTIN) 100 MG capsule Take 1 capsule (100 mg total) by mouth 2 (two) times daily.    hydrALAZINE (APRESOLINE) 25 MG tablet Take 1 tablet (25 mg total) by mouth 3 (three) times daily.    lisinopril (ZESTRIL) 20 MG tablet TAKE ONE TABLET BY MOUTH ONCE DAILY WITH BREAKFAST    mometasone (NASONEX) 50 MCG/ACT nasal spray Place 2 sprays into the nose daily.    montelukast (SINGULAIR) 10 MG tablet Take 1 tablet (10 mg total) by mouth at bedtime.    sitaGLIPtin (JANUVIA) 100 MG tablet Take 100 mg by mouth daily.    vitamin C (ASCORBIC ACID) 500 MG tablet Take 500 mg by mouth daily.     carvedilol (COREG) 25 MG tablet Take 1 tablet (25 mg total) by mouth 2 (two) times daily. 10/21/2020: Patient states she is still taking   No facility-administered encounter medications on file as of 11/25/2020.    Patient Active Problem List   Diagnosis Date Noted   CKD (chronic kidney disease) stage 4, GFR 15-29 ml/min ([place]) 07/09/2019   Diabetes mellitus type 2 in obese ([place]) 07/02/2019   Polyneuropathy associated with underlying disease ([place]) 11/28/2018   Severe episode of recurrent major depressive disorder, without psychotic features ([place]) 11/28/2018   Type 2 diabetes mellitus with diabetic peripheral angiopathy without gangrene, without long-term current use of insulin ([place]) 11/28/2018   Poor short term memory 11/28/2018   Mild aortic stenosis by prior echocardiogram 11/06/2018   Symptomatic anemia 03/17/2016  S/P total knee arthroplasty, right 03/17/2016   Unilateral primary osteoarthritis, right knee    Hyperlipidemia LDL goal <70 12/15/2015   Pain in both lower extremities 11/19/2015   Numbness in both hands 08/13/2015   Panic attack 06/09/2015   Right knee pain 05/14/2015   Chronic renal insufficiency 09/16/2014   Pneumothorax on right    Atypical mycobacterium infection    ILD (interstitial lung disease) 2/2 MAI s/p med rxn    Morbid  obesity ([place]) 04/03/2014   OSA (obstructive sleep apnea) 02/25/2014   Hypertensive heart disease with chronic diastolic congestive heart failure ([place]) 02/25/2014   DOE (dyspnea on exertion) 02/25/2014   Essential hypertension 02/25/2014    Conditions to be addressed/monitored:CHF, HTN, and CKD Stage 4  Care Plan : Cardiovascular  Updates made by Dannielle Karvonen, RN since 11/25/2020 12:00 AM     Problem: Symptom Exacerbation (Heart Failure, Hypertension)   Priority: High     Long-Range Goal: Symptom Exacerbation Prevented or Minimized   Start Date: 10/21/2020  Expected End Date: 04/06/2021  This Visit's Progress: On track  Recent Progress: On track  Priority: High  Note:   Current Barriers:  Knowledge deficits related to long term care plan for self management of chronic health conditions:  Heart failure, Hypertension, CKD stage 4 :  Patient states she chooses not to weigh or monitor blood pressure at home. She states she feels she is able to tell when her weight goes up or when she has swelling in her legs/ feet etc. Denies any new or ongoing symptoms. Patient states she continues to go to the weight management program. Reports weight at 11/16/2020 provider office visit was 335 lbs.   Patient states she is seeing Dr. Lonell Page, oncologist for anemia. She states she was put on iron pills and stool softener.  Nurse Case Manager Clinical Goal(s):  patient will weigh self daily and record patient will verbalize understanding of Heart Failure Action Plan and when to call doctor patient will take  medications as prescribed Patient will follow up with her providers as recommended Patient will continue to exercise at least 3-5 days per week Patient will adhere to weight management food plan at least 25-50%.  Interventions:  Collaboration with [name], [name], NP regarding development and update of comprehensive plan of care as evidenced by provider attestation and  co-signature Inter-disciplinary care team collaboration (see longitudinal plan of care) Basic overview and discussion Heart failure/ HTN.  Provided  verbal education on low sodium diet Reviewed Heart Failure Action Plan  Discussed and reviewed upcoming provider visits:  Patient states she has a follow visit with her podiatrist on 11/26/2020 and a follow up visit with the nephrologist on 12/28/2020.  Discussed importance of daily weight Reviewed and discussed medications Provided  verbal education on heart failure symptoms, Hypertension management, and maintaining kidney health.  Patient Goals:  - Continue to take your Medications as prescribed and refill timely - Consider weighing daily and recording (notify MD with 3 lb weight gain over night or 5 lb in a week and/ or increase swelling and shortness of breath, - watch for swelling in feet, ankles and legs every day, Keep legs elevated when sitting if swelling).  - Follow CHF Action Plan ( see education information sent to you in my chart) - Adhere to low sodium diet - Follow up with your provider as recommended. Notify provider of any new or ongoing symptoms - Maintain kidney health:  Make healthy food choices. Heart healthy,  low salt Make physical activity part of your routine. ... Aim for a healthy weight. ... Get enough sleep. ... Limit alcohol intake link. ... Explore stress-reducing activities. ... Manage diabetes, high blood pressure, heart disease       Plan:The patient has been provided with contact information for the care management team and has been advised to call with any health related questions or concerns.  The care management team will reach out to the patient again over the next 2-3 months  Quinn Plowman RN,BSN,CCM RN Case Manager [place]  [phone]

## 2020-11-26 ENCOUNTER — Ambulatory Visit: Payer: Medicare Other | Admitting: Sports Medicine

## 2020-11-26 ENCOUNTER — Encounter: Payer: Self-pay | Admitting: Sports Medicine

## 2020-11-26 ENCOUNTER — Other Ambulatory Visit: Payer: Self-pay

## 2020-11-26 DIAGNOSIS — E1142 Type 2 diabetes mellitus with diabetic polyneuropathy: Secondary | ICD-10-CM

## 2020-11-26 DIAGNOSIS — M79675 Pain in left toe(s): Secondary | ICD-10-CM | POA: Diagnosis not present

## 2020-11-26 DIAGNOSIS — G4733 Obstructive sleep apnea (adult) (pediatric): Secondary | ICD-10-CM | POA: Diagnosis not present

## 2020-11-26 DIAGNOSIS — M79674 Pain in right toe(s): Secondary | ICD-10-CM | POA: Diagnosis not present

## 2020-11-26 DIAGNOSIS — B351 Tinea unguium: Secondary | ICD-10-CM | POA: Diagnosis not present

## 2020-11-26 NOTE — Progress Notes (Signed)
Subjective: Stacey Page is a 70 y.o. female patient with history of diabetes who presents to office today complaining of long,mildly painful nails and painful callus while ambulating in shoes; unable to trim. Patient states that she is doing good does have some dry callused skin to both feet.  Patient denies any other pedal complaints at this time. Patient is assisted by sister who will also be seen this visit. Patient Active Problem List   Diagnosis Date Noted   CKD (chronic kidney disease) stage 4, GFR 15-29 ml/min ([place]) 07/09/2019   Diabetes mellitus type 2 in obese ([place]) 07/02/2019   Polyneuropathy associated with underlying disease ([place]) 11/28/2018   Severe episode of recurrent major depressive disorder, without psychotic features ([place]) 11/28/2018   Type 2 diabetes mellitus with diabetic peripheral angiopathy without gangrene, without long-term current use of insulin ([place]) 11/28/2018   Poor short term memory 11/28/2018   Mild aortic stenosis by prior echocardiogram 11/06/2018   Symptomatic anemia 03/17/2016   S/P total knee arthroplasty, right 03/17/2016   Unilateral primary osteoarthritis, right knee    Hyperlipidemia LDL goal <70 12/15/2015   Pain in both lower extremities 11/19/2015   Numbness in both hands 08/13/2015   Panic attack 06/09/2015   Right knee pain 05/14/2015   Chronic renal insufficiency 09/16/2014   Pneumothorax on right    Atypical mycobacterium infection    ILD (interstitial lung disease) 2/2 MAI s/p med rxn    Morbid obesity ([place]) 04/03/2014   OSA (obstructive sleep apnea) 02/25/2014   Hypertensive heart disease with chronic diastolic congestive heart failure ([place]) 02/25/2014   DOE (dyspnea on exertion) 02/25/2014   Essential hypertension 02/25/2014   Current Outpatient Medications on File Prior to Visit  Medication Sig Dispense Refill   acetaminophen (TYLENOL) 500 MG tablet Take 1 tablet (500 mg total) by mouth every 6 (six) hours as needed. 30 tablet 0    albuterol (PROAIR HFA) 108 (90 Base) MCG/ACT inhaler Inhale 2 puffs into the lungs every 6 (six) hours as needed for wheezing or shortness of breath. 1 each 3   aspirin 81 MG tablet Take 1 tablet (81 mg total) by mouth daily with breakfast. 90 tablet 0   atorvastatin (LIPITOR) 20 MG tablet Take 1 tablet (20 mg total) by mouth daily at 6 PM. 90 tablet 3   carvedilol (COREG) 25 MG tablet Take 1 tablet (25 mg total) by mouth 2 (two) times daily. 180 tablet 3   Clobetasol Prop Emollient Base (CLOBETASOL PROPIONATE E) 0.05 % emollient cream Apply 1 application topically daily as needed (skin irritations). 30 g 2   furosemide (LASIX) 40 MG tablet TAKE 1 TAB FOUR TIMES PER WEEK, TAKE AN ADDITIONAL TAB FOR WEIGHT GAIN OF 2 LBS OVERNIGHT OR 5 LBS IN ONE WEEK 180 tablet 2   gabapentin (NEURONTIN) 100 MG capsule Take 1 capsule (100 mg total) by mouth 2 (two) times daily. 180 capsule 1   hydrALAZINE (APRESOLINE) 25 MG tablet Take 1 tablet (25 mg total) by mouth 3 (three) times daily. 270 tablet 3   lisinopril (ZESTRIL) 20 MG tablet TAKE ONE TABLET BY MOUTH ONCE DAILY WITH BREAKFAST 90 tablet 3   mometasone (NASONEX) 50 MCG/ACT nasal spray Place 2 sprays into the nose daily. 17 g 11   montelukast (SINGULAIR) 10 MG tablet Take 1 tablet (10 mg total) by mouth at bedtime. 90 tablet 3   sitaGLIPtin (JANUVIA) 100 MG tablet Take 100 mg by mouth daily.     vitamin C (ASCORBIC ACID)  500 MG tablet Take 500 mg by mouth daily.      No current facility-administered medications on file prior to visit.   No Known Allergies  Recent Results (from the past 2160 hour(s))  Hemoglobin A1c     Status: Abnormal   Collection Time: 10/14/20 10:57 AM  Result Value Ref Range   Hgb A1c MFr Bld 6.1 (H) 4.8 - 5.6 %    Comment:          Prediabetes: 5.7 - 6.4          Diabetes: >6.4          Glycemic control for adults with diabetes: <7.0    Est. average glucose Bld gHb Est-mCnc 128 mg/dL  Comprehensive metabolic panel      Status: Abnormal   Collection Time: 10/14/20 10:57 AM  Result Value Ref Range   Glucose 107 (H) 65 - 99 mg/dL   BUN 23 8 - 27 mg/dL   Creatinine, Ser 1.67 (H) 0.57 - 1.00 mg/dL   eGFR 33 (L) >59 mL/min/1.73   BUN/Creatinine Ratio 14 12 - 28   Sodium 140 134 - 144 mmol/L   Potassium 4.8 3.5 - 5.2 mmol/L   Chloride 101 96 - 106 mmol/L   CO2 24 20 - 29 mmol/L   Calcium 9.5 8.7 - 10.3 mg/dL   Total Protein 7.6 6.0 - 8.5 g/dL   Albumin 4.2 3.8 - 4.8 g/dL   Globulin, Total 3.4 1.5 - 4.5 g/dL   Albumin/Globulin Ratio 1.2 1.2 - 2.2   Bilirubin Total 0.3 0.0 - 1.2 mg/dL   Alkaline Phosphatase 79 44 - 121 IU/L   AST 12 0 - 40 IU/L   ALT 8 0 - 32 IU/L  Anemia panel     Status: Abnormal   Collection Time: 10/14/20 10:57 AM  Result Value Ref Range   Total Iron Binding Capacity 317 250 - 450 ug/dL   UIBC 279 118 - 369 ug/dL   Iron 38 27 - 139 ug/dL   Iron Saturation 12 (L) 15 - 55 %   Vitamin B-12 565 232 - 1,245 pg/mL   Folate, Hemolysate 389.0 Not Estab. ng/mL   Hematocrit 32.9 (L) 34.0 - 46.6 %   Folate, RBC 1,182 >498 ng/mL   Ferritin 27 15 - 150 ng/mL   Retic Ct Pct 1.2 0.6 - 2.6 %  HM DIABETES EYE EXAM     Status: None   Collection Time: 11/05/20 12:00 AM  Result Value Ref Range   HM Diabetic Eye Exam No Retinopathy No Retinopathy    Comment: [facility]  Cologuard     Status: None   Collection Time: 11/11/20  8:08 AM  Result Value Ref Range   Cologuard Negative Negative    Comment:  NEGATIVE TEST RESULT. A negative Cologuard result indicates a low likelihood that a colorectal cancer (CRC) or advanced adenoma (adenomatous polyps with more advanced pre-malignant features)  is present. The chance that a person with a negative Cologuard test has a colorectal cancer is less than 1 in 1500 (negative predictive value >99.9%) or has an  advanced adenoma is less than  5.3% (negative predictive value 94.7%). These data are based on a prospective cross-sectional study of 10,000 individuals  at average risk for colorectal cancer who were screened with both Cologuard and colonoscopy. (Imperiale T. et al, N Engl J Med 2014;370(14):1286-1297) The normal value (reference range) for this assay is negative.  COLOGUARD RE-SCREENING RECOMMENDATION: Periodic colorectal cancer screening is an  important part of preventive healthcare for asymptomatic individuals at average risk for colorectal cancer.  Following a negative Cologuard result, the [place] Task Force screening guidelines recommend a Cologuard re-screening interval of 3 years.  References: American Cancer Society Guideline for Colorectal Cancer Screening: https://www.cancer.org/cancer/colon-rectal-cancer/detection-diagnosis-staging/acs-recommendations.html.; Rex DK, Boland CR, Dominitz JK, Colorectal Cancer Screening: Recommendations for Physicians and Patients from the [place] Task Force on Colorectal Cancer Screening , Am J Gastroenterology 2017; 016:0109-3235.  TEST DESCRIPTION: Composite algorithmic analysis of stool DNA-biomarkers with hemoglobin immunoassay.   Quantitative values of individual biomarkers are not reportable and are not associated with individual biomarker result reference ranges. Cologuard is intended for colorectal cancer screening of adults of either sex, 80 years or older, who are at average-risk for colorectal cancer (CRC). Cologuard has been approved for use by the U.S. FDA. The performance of Cologuard was  established in a cross sectional study of average-risk adults aged 60-84. Cologuard performance in patients ages 36 to 46 years was estimated by sub-group analysis of near-age groups. Colonoscopies performed for a positive result may find as the most clinically significant lesion: colorectal cancer [4.0%], advanced adenoma (including sessile serrated polyps greater than or equal to 1cm diameter) [20%] or non- advanced adenoma [31%]; or no colorectal neoplasia [45%].  These estimates are derived from a prospective cross-sectional screening study of 10,000 individuals at average risk for colorectal cancer who were screened with both Cologuard and colonoscopy. (Imperiale T. et al, Alison Stalling J Med 2014;370(14):1286-1297.) Cologuard may produce a false negative or false positive result (no colorectal cancer or precancerous polyp present at colonoscopy follow up). A negative Cologuard test result does not guarantee the absence of CRC or advanced adenoma (pre-cancer). The current Cologuard  screening interval is every 3 years. Paramedic and [employer] developer). Cologuard performance data in a 10,000 patient pivotal study using colonoscopy as the reference method can be accessed at the following location: www.exactlabs.com/results. Additional description of the Cologuard test process, warnings and precautions can be found at www.cologuard.com.     Objective: General: Patient is awake, alert, and oriented x 3 and in no acute distress.  Integument: Skin is warm, dry and supple bilateral. Nails are tender, long, thickened and dystrophic with subungual debris, consistent with onychomycosis, 1-5 bilateral.  There is mild curvature of bilateral hallux nails with no acute signs of infection.  Dry skin to heels.  Vasculature:  Dorsalis Pedis pulse 1/4 bilateral. Posterior Tibial pulse 1/4 bilateral.  Capillary fill time <3 sec 1-5 bilateral. Positive hair growth to the level of the digits. Temperature gradient within normal limits. No varicosities present bilateral. No edema present bilateral.   Neurology: Gross sensation present via light touch bilateral.  Musculoskeletal: Asymptomatic pes planus and lesser hammertoe pedal deformities noted bilateral. Muscular strength 5/5 in all lower extremity muscular groups bilateral without pain on range of motion . No tenderness with calf compression bilateral.  Assessment and Plan: Problem List Items Addressed  This Visit   None Visit Diagnoses     Pain due to onychomycosis of toenails of both feet    -  Primary   Diabetic peripheral neuropathy associated with type 2 diabetes mellitus ([place])           -Examined patient. -Re-Discussed and educated patient on diabetic foot care, especially with  regards to the vascular, neurological and musculoskeletal systems.  -Mechanically debrided all nails 1-5 bilateral using sterile nail nipper and filed with dremel without incident  -Advised  patient to continue with daily skin emollients/creams for dry skin -Answered all patient questions -Patient to return  in 3 months for at risk foot care or sooner if issues or problems arise meanwhile.  Landis Martins, DPM

## 2020-11-27 ENCOUNTER — Telehealth: Payer: Self-pay | Admitting: Nurse Practitioner

## 2020-11-27 DIAGNOSIS — G63 Polyneuropathy in diseases classified elsewhere: Secondary | ICD-10-CM

## 2020-11-27 MED ORDER — GABAPENTIN 100 MG PO CAPS: 100.0000 mg | ORAL_CAPSULE | Freq: Two times a day (BID) | ORAL | 1 refills | Status: DC

## 2020-11-27 NOTE — Telephone Encounter (Signed)
Pt called requesting gabapentin, she asked that I request this from [facility].

## 2020-11-27 NOTE — Telephone Encounter (Signed)
Refill of gabapentin sent into [facility] for delivery today per patient request.   Junius Argyle, PharmD, BCACP, CPP Clinical Pharmacist [facility]  [phone]

## 2020-12-04 NOTE — Telephone Encounter (Signed)
Patient called several times stating she still has not received her medication and has been waiting for over a week and does not understand why we are not communicating with the mail order pharmacy. I called the pharmacy ([facility]) and spoke with Otila Kluver who informed me that they were waiting on the patient to confirm she wanted the medication transferred between pharmacies. She gave a reference number of [id number] and states the patient needed to voice her approval to process a inbound Rx transfer. Information given to patient to call and request. Pt verbalized understanding.

## 2020-12-07 DIAGNOSIS — N184 Chronic kidney disease, stage 4 (severe): Secondary | ICD-10-CM

## 2020-12-07 DIAGNOSIS — I10 Essential (primary) hypertension: Secondary | ICD-10-CM | POA: Diagnosis not present

## 2020-12-07 DIAGNOSIS — I110 Hypertensive heart disease with heart failure: Secondary | ICD-10-CM

## 2020-12-07 DIAGNOSIS — I5032 Chronic diastolic (congestive) heart failure: Secondary | ICD-10-CM

## 2020-12-09 ENCOUNTER — Encounter (INDEPENDENT_AMBULATORY_CARE_PROVIDER_SITE_OTHER): Payer: Self-pay | Admitting: Bariatrics

## 2020-12-09 ENCOUNTER — Other Ambulatory Visit: Payer: Self-pay

## 2020-12-09 ENCOUNTER — Ambulatory Visit (INDEPENDENT_AMBULATORY_CARE_PROVIDER_SITE_OTHER): Payer: Medicare Other | Admitting: Bariatrics

## 2020-12-09 VITALS — BP 105/67 | HR 67 | Temp 97.9°F | Ht 68.0 in | Wt 346.0 lb

## 2020-12-09 DIAGNOSIS — I10 Essential (primary) hypertension: Secondary | ICD-10-CM

## 2020-12-09 DIAGNOSIS — G4733 Obstructive sleep apnea (adult) (pediatric): Secondary | ICD-10-CM | POA: Diagnosis not present

## 2020-12-09 DIAGNOSIS — Z6841 Body Mass Index (BMI) 40.0 and over, adult: Secondary | ICD-10-CM

## 2020-12-09 DIAGNOSIS — N184 Chronic kidney disease, stage 4 (severe): Secondary | ICD-10-CM | POA: Diagnosis not present

## 2020-12-09 NOTE — Progress Notes (Signed)
Chief Complaint:   OBESITY Ajwa is here to discuss her progress with her obesity treatment plan along with follow-up of her obesity related diagnoses. Skylynn is on the Category 1 Plan and states she is following her eating plan approximately 50% of the time. Abbey states she is walking for 15 minutes 3 times per week.  Today's visit was #: 33 Starting weight: 344 lbs Starting date: 03/25/2019 Today's weight: 346 lbs Today's date: 12/09/2020 Total lbs lost to date: 0 Total lbs lost since last in-office visit: 1 lb  Interim History: Caliya is down 1 lb since her last visit. She is doing well with her water intake.   Subjective:   1. Essential hypertension, benign Taleeyah's blood pressure is controlled.   2. Stage 4 chronic kidney disease ([place]) Katerina is seeing a nephrologist.   Assessment/Plan:   1. Essential hypertension, benign Laporsche will continue her medications. She is working on healthy weight loss and exercise to improve blood pressure control. We will watch for signs of hypotension as she continues her lifestyle modifications.  2. Stage 4 chronic kidney disease ([place]) Lab results and trends reviewed. We discussed several lifestyle modifications today and she will continue to work on diet, exercise and weight loss efforts. Mirabelle will continue to follow up with nephrologist. She will keep hydrated. She will follow up with nephrologist next week. Avoid nephrotoxic medications. Orders and follow up as documented in patient record.   Counseling Chronic kidney disease (CKD) happens when the kidneys are damaged over a long period of time. Most of the time, this condition does not go away, but it can usually be controlled. Steps must be taken to slow down the kidney damage or to stop it from getting worse. Intensive lifestyle modifications are the first line treatment for this issue.  Avoid buying foods that are: processed, frozen, or prepackaged to  avoid excess salt.    3. Class 3 severe obesity with serious comorbidity and body mass index (BMI) of 50.0 to 59.9 in adult, unspecified obesity type ([place]) Tamsen is currently in the action stage of change. As such, her goal is to continue with weight loss efforts. She has agreed to the Category 1 Plan.   Karima will adhere closer to the plan. She will eat smaller meals. Strategies for holiday was discussed.   Exercise goals:  Cherylyn will increase walking.  Behavioral modification strategies: increasing lean protein intake, decreasing simple carbohydrates, increasing vegetables, increasing water intake, decreasing eating out, no skipping meals, meal planning and cooking strategies, keeping healthy foods in the home, and planning for success.  Yarexi has agreed to follow-up with our clinic in 4 weeks. She was informed of the importance of frequent follow-up visits to maximize her success with intensive lifestyle modifications for her multiple health conditions.   Objective:   Blood pressure 105/67, pulse 67, temperature 97.9 F (36.6 C), height 5\' 8"  (1.727 m), weight (!) 346 lb (156.9 kg), SpO2 95 %. Body mass index is 52.61 kg/m.  General: Cooperative, alert, well developed, in no acute distress. HEENT: Conjunctivae and lids unremarkable. Cardiovascular: Regular rhythm.  Lungs: Normal work of breathing. Neurologic: No focal deficits.   Lab Results  Component Value Date   CREATININE 1.67 (H) 10/14/2020   BUN 23 10/14/2020   NA 140 10/14/2020   K 4.8 10/14/2020   CL 101 10/14/2020   CO2 24 10/14/2020   Lab Results  Component Value Date   ALT 8 10/14/2020   AST 12 10/14/2020  ALKPHOS 79 10/14/2020   BILITOT 0.3 10/14/2020   Lab Results  Component Value Date   HGBA1C 6.1 (H) 10/14/2020   HGBA1C 6.1 05/14/2020   HGBA1C 6.0 (H) 10/28/2019   HGBA1C 5.9 (H) 07/04/2019   HGBA1C 6.0 (H) 03/25/2019   Lab Results  Component Value Date   INSULIN 8.6 10/28/2019    INSULIN 9.2 07/04/2019   INSULIN 8.8 03/25/2019   Lab Results  Component Value Date   TSH 0.51 11/28/2018   Lab Results  Component Value Date   CHOL 116 05/14/2020   HDL 51.60 05/14/2020   LDLCALC 52 05/14/2020   TRIG 61.0 05/14/2020   CHOLHDL 2 05/14/2020   Lab Results  Component Value Date   VD25OH 57.6 10/28/2019   VD25OH 41.5 07/04/2019   VD25OH 38.2 03/25/2019   Lab Results  Component Value Date   WBC 5.3 08/12/2020   HGB 10.9 (L) 08/12/2020   HCT 32.9 (L) 10/14/2020   MCV 90.6 08/12/2020   PLT 213 08/12/2020   Lab Results  Component Value Date   IRON 38 10/14/2020   TIBC 317 10/14/2020   FERRITIN 27 10/14/2020   Attestation Statements:   Reviewed by clinician on day of visit: allergies, medications, problem list, medical history, surgical history, family history, social history, and previous encounter notes.  I, Lizbeth Bark, RMA, am acting as Location manager for [employer], DO.   I have reviewed the above documentation for accuracy and completeness, and I agree with the above. Jearld Lesch, DO

## 2020-12-10 ENCOUNTER — Other Ambulatory Visit: Payer: Self-pay

## 2020-12-10 DIAGNOSIS — G63 Polyneuropathy in diseases classified elsewhere: Secondary | ICD-10-CM

## 2020-12-10 MED ORDER — GABAPENTIN 100 MG PO CAPS: 100.0000 mg | ORAL_CAPSULE | Freq: Two times a day (BID) | ORAL | 1 refills | Status: DC

## 2020-12-10 NOTE — Telephone Encounter (Signed)
Spoke with patient and pharmacy multiple times and the pharmacy states they are having a issue with the medication being transferred from [facility] to their pharmacy and would like for medication to be sent in again and not through [facility]. I have sent medication electronically and informed patient to contact our office if she has any further issues.

## 2020-12-21 ENCOUNTER — Telehealth: Payer: Self-pay | Admitting: Adult Health

## 2020-12-21 NOTE — Telephone Encounter (Signed)
Rescheduled appointment per provider. Patient aware.  

## 2020-12-22 ENCOUNTER — Ambulatory Visit (INDEPENDENT_AMBULATORY_CARE_PROVIDER_SITE_OTHER): Payer: Medicare Other | Admitting: Nurse Practitioner

## 2020-12-22 ENCOUNTER — Encounter: Payer: Self-pay | Admitting: Nurse Practitioner

## 2020-12-22 ENCOUNTER — Other Ambulatory Visit: Payer: Self-pay

## 2020-12-22 VITALS — BP 138/60 | HR 66 | Temp 96.2°F | Ht 66.0 in | Wt 351.4 lb

## 2020-12-22 DIAGNOSIS — N644 Mastodynia: Secondary | ICD-10-CM

## 2020-12-22 NOTE — Patient Instructions (Signed)
You will be contacted to schedule appt for breast US and possibly diagnostic mammogram Use cold compress if needed

## 2020-12-22 NOTE — Progress Notes (Signed)
Subjective:  Patient ID: Stacey Page, female    DOB: Feb 20, 1950  Age: 70 y.o. MRN: [id number]  CC: Acute Visit (Pt c/o lumps in right breast that she felt Sunday night. Pt states they are painful and tender to the touch. /Declines vaccines)  HPI [name] presents with right breast pain x 1week. denies any previous chest wall injury. Last mammogram 07/2020 (normal) Hx of right intraductal papilloma.  Reviewed past Medical, Social and Family history today.  Outpatient Medications Prior to Visit  Medication Sig Dispense Refill   acetaminophen (TYLENOL) 500 MG tablet Take 1 tablet (500 mg total) by mouth every 6 (six) hours as needed. 30 tablet 0   albuterol (PROAIR HFA) 108 (90 Base) MCG/ACT inhaler Inhale 2 puffs into the lungs every 6 (six) hours as needed for wheezing or shortness of breath. 1 each 3   aspirin 81 MG tablet Take 1 tablet (81 mg total) by mouth daily with breakfast. 90 tablet 0   atorvastatin (LIPITOR) 20 MG tablet Take 1 tablet (20 mg total) by mouth daily at 6 PM. 90 tablet 3   Clobetasol Prop Emollient Base (CLOBETASOL PROPIONATE E) 0.05 % emollient cream Apply 1 application topically daily as needed (skin irritations). 30 g 2   fluticasone (FLONASE) 50 MCG/ACT nasal spray Place into both nostrils.     furosemide (LASIX) 40 MG tablet TAKE 1 TAB FOUR TIMES PER WEEK, TAKE AN ADDITIONAL TAB FOR WEIGHT GAIN OF 2 LBS OVERNIGHT OR 5 LBS IN ONE WEEK 180 tablet 2   gabapentin (NEURONTIN) 100 MG capsule Take 1 capsule (100 mg total) by mouth 2 (two) times daily. 180 capsule 1   hydrALAZINE (APRESOLINE) 25 MG tablet Take 1 tablet (25 mg total) by mouth 3 (three) times daily. 270 tablet 3   lisinopril (ZESTRIL) 20 MG tablet TAKE ONE TABLET BY MOUTH ONCE DAILY WITH BREAKFAST 90 tablet 3   mometasone (NASONEX) 50 MCG/ACT nasal spray Place 2 sprays into the nose daily. 17 g 11   montelukast (SINGULAIR) 10 MG tablet Take 1 tablet (10 mg total) by mouth at bedtime. 90 tablet 3    sitaGLIPtin (JANUVIA) 100 MG tablet Take 100 mg by mouth daily.     vitamin C (ASCORBIC ACID) 500 MG tablet Take 500 mg by mouth daily.      carvedilol (COREG) 25 MG tablet Take 1 tablet (25 mg total) by mouth 2 (two) times daily. 180 tablet 3   No facility-administered medications prior to visit.    ROS See HPI  Objective:  BP 138/60 (BP Location: Left Arm, Patient Position: Sitting, Cuff Size: Large)   Pulse 66   Temp (!) 96.2 F (35.7 C) (Temporal)   Ht 5\' 6"  (1.676 m)   Wt (!) 351 lb 6.4 oz (159.4 kg)   SpO2 98%   BMI 56.72 kg/m   Physical Exam Chest:  Breasts:    Right: Tenderness present. No swelling, bleeding, inverted nipple, mass, nipple discharge or skin change.     Left: Normal.    Lymphadenopathy:     Upper Body:     Right upper body: No supraclavicular, axillary or pectoral adenopathy.     Left upper body: No supraclavicular, axillary or pectoral adenopathy.  Neurological:     Mental Status: She is alert.   Assessment & Plan:  This visit occurred during the SARS-CoV-2 public health emergency.  Safety protocols were in place, including screening questions prior to the visit, additional usage of staff PPE, and extensive cleaning  of exam room while observing appropriate contact time as indicated for disinfecting solutions.   [name] was seen today for acute visit.  Diagnoses and all orders for this visit:  Breast pain, right -     US BREAST COMPLETE UNI RIGHT INC AXILLA; Future -     MM Digital Diagnostic Unilat R; Future  You will be contacted to schedule appt for breast US and possibly diagnostic mammogram Use cold compress if needed Problem List Items Addressed This Visit   None Visit Diagnoses     Breast pain, right    -  Primary   Relevant Orders   US BREAST COMPLETE UNI RIGHT INC AXILLA   MM Digital Diagnostic Unilat R       Follow-up: No follow-ups on file.  Wilfred Lacy, NP

## 2020-12-23 DIAGNOSIS — N1832 Chronic kidney disease, stage 3b: Secondary | ICD-10-CM | POA: Diagnosis not present

## 2020-12-24 ENCOUNTER — Other Ambulatory Visit: Payer: Self-pay

## 2020-12-24 ENCOUNTER — Ambulatory Visit
Admission: RE | Admit: 2020-12-24 | Discharge: 2020-12-24 | Disposition: A | Discharge status: Home / Self Care | Payer: Medicare Other | Source: Ambulatory Visit | Attending: Nurse Practitioner | Admitting: Nurse Practitioner

## 2020-12-24 DIAGNOSIS — N644 Mastodynia: Secondary | ICD-10-CM

## 2020-12-24 DIAGNOSIS — R922 Inconclusive mammogram: Secondary | ICD-10-CM | POA: Diagnosis not present

## 2020-12-24 NOTE — Progress Notes (Signed)
Good news: no mass Pain is probably due to musculoskeletal pain. Use cold compress and/or tylenol as needed

## 2020-12-27 DIAGNOSIS — G4733 Obstructive sleep apnea (adult) (pediatric): Secondary | ICD-10-CM | POA: Diagnosis not present

## 2020-12-28 DIAGNOSIS — E1122 Type 2 diabetes mellitus with diabetic chronic kidney disease: Secondary | ICD-10-CM | POA: Diagnosis not present

## 2020-12-28 DIAGNOSIS — I5032 Chronic diastolic (congestive) heart failure: Secondary | ICD-10-CM | POA: Diagnosis not present

## 2020-12-28 DIAGNOSIS — N184 Chronic kidney disease, stage 4 (severe): Secondary | ICD-10-CM | POA: Diagnosis not present

## 2020-12-28 DIAGNOSIS — D631 Anemia in chronic kidney disease: Secondary | ICD-10-CM | POA: Diagnosis not present

## 2020-12-28 DIAGNOSIS — N2581 Secondary hyperparathyroidism of renal origin: Secondary | ICD-10-CM | POA: Diagnosis not present

## 2020-12-28 DIAGNOSIS — I129 Hypertensive chronic kidney disease with stage 1 through stage 4 chronic kidney disease, or unspecified chronic kidney disease: Secondary | ICD-10-CM | POA: Diagnosis not present

## 2020-12-29 ENCOUNTER — Ambulatory Visit (INDEPENDENT_AMBULATORY_CARE_PROVIDER_SITE_OTHER): Payer: Medicare Other | Admitting: Bariatrics

## 2020-12-29 ENCOUNTER — Other Ambulatory Visit: Payer: Self-pay

## 2020-12-29 ENCOUNTER — Encounter (INDEPENDENT_AMBULATORY_CARE_PROVIDER_SITE_OTHER): Payer: Self-pay | Admitting: Bariatrics

## 2020-12-29 VITALS — BP 146/84 | HR 68 | Temp 97.5°F | Ht 66.0 in | Wt 347.0 lb

## 2020-12-29 DIAGNOSIS — E1169 Type 2 diabetes mellitus with other specified complication: Secondary | ICD-10-CM | POA: Diagnosis not present

## 2020-12-29 DIAGNOSIS — I10 Essential (primary) hypertension: Secondary | ICD-10-CM | POA: Diagnosis not present

## 2020-12-29 DIAGNOSIS — E669 Obesity, unspecified: Secondary | ICD-10-CM | POA: Diagnosis not present

## 2020-12-29 DIAGNOSIS — E66813 Obesity, class 3: Secondary | ICD-10-CM

## 2020-12-29 DIAGNOSIS — Z6841 Body Mass Index (BMI) 40.0 and over, adult: Secondary | ICD-10-CM

## 2020-12-29 NOTE — Progress Notes (Signed)
Chief Complaint:   OBESITY Stacey Page is here to discuss her progress with her obesity treatment plan along with follow-up of her obesity related diagnoses. Stacey Page is on the Category 2 Plan and states she is following her eating plan approximately 80% of the time. Stacey Page states she is walking for 15 minutes 1 time per week.  Today's visit was #: 43 Starting weight: 344 lbs Starting date: 03/25/2019 Today's weight: 347 lbs Today's date: 12/29/2020 Total lbs lost to date: 0 Total lbs lost since last in-office visit: 0  Interim History: Stacey Page is up 1 pound since her last office visit.  Subjective:   1. Diabetes mellitus type 2 in obese [facility]) She is taking Januvia.  She went to the kidney specialist.  2. Essential hypertension Reasonably well controlled.  Taking Coreg, Lasix, hydralazine, and lisinopril.  Assessment/Plan:   1. Diabetes mellitus type 2 in obese (HCC) Continue medications.  Follow-up with the Nephrologist.  Good blood sugar control is important to decrease the likelihood of diabetic complications such as nephropathy, neuropathy, limb loss, blindness, coronary artery disease, and death. Intensive lifestyle modification including diet, exercise and weight loss are the first line of treatment for diabetes.   2. Essential hypertension Continue medications.  Stacey Page is working on healthy weight loss and exercise to improve blood pressure control. We will watch for signs of hypotension as she continues her lifestyle modifications.  3. Obesity, current BMI 56.1  Stacey Page is currently in the action stage of change. As such, her goal is to continue with weight loss efforts. She has agreed to the Category 2 Plan.   She will work on meal planning, will adhere closely to the plan, and will follow strategies for the holidays.  Exercise goals:  As is.  Behavioral modification strategies: increasing lean protein intake, decreasing simple carbohydrates, increasing  vegetables, increasing water intake, decreasing eating out, no skipping meals, meal planning and cooking strategies, keeping healthy foods in the home, and planning for success.  Stacey Page has agreed to follow-up with our clinic in 4-6 weeks. She was informed of the importance of frequent follow-up visits to maximize her success with intensive lifestyle modifications for her multiple health conditions.   Objective:   Blood pressure (!) 146/84, pulse 68, temperature (!) 97.5 F (36.4 C), height 5\' 6"  (1.676 m), weight (!) 347 lb (157.4 kg), SpO2 93 %. Body mass index is 56.01 kg/m.  General: Cooperative, alert, well developed, in no acute distress. HEENT: Conjunctivae and lids unremarkable. Cardiovascular: Regular rhythm.  Lungs: Normal work of breathing. Neurologic: No focal deficits.   Lab Results  Component Value Date   CREATININE 1.67 (H) 10/14/2020   BUN 23 10/14/2020   NA 140 10/14/2020   K 4.8 10/14/2020   CL 101 10/14/2020   CO2 24 10/14/2020   Lab Results  Component Value Date   ALT 8 10/14/2020   AST 12 10/14/2020   ALKPHOS 79 10/14/2020   BILITOT 0.3 10/14/2020   Lab Results  Component Value Date   HGBA1C 6.1 (H) 10/14/2020   HGBA1C 6.1 05/14/2020   HGBA1C 6.0 (H) 10/28/2019   HGBA1C 5.9 (H) 07/04/2019   HGBA1C 6.0 (H) 03/25/2019   Lab Results  Component Value Date   INSULIN 8.6 10/28/2019   INSULIN 9.2 07/04/2019   INSULIN 8.8 03/25/2019   Lab Results  Component Value Date   TSH 0.51 11/28/2018   Lab Results  Component Value Date   CHOL 116 05/14/2020   HDL 51.60 05/14/2020  LDLCALC 52 05/14/2020   TRIG 61.0 05/14/2020   CHOLHDL 2 05/14/2020   Lab Results  Component Value Date   VD25OH 57.6 10/28/2019   VD25OH 41.5 07/04/2019   VD25OH 38.2 03/25/2019   Lab Results  Component Value Date   WBC 5.3 08/12/2020   HGB 10.9 (L) 08/12/2020   HCT 32.9 (L) 10/14/2020   MCV 90.6 08/12/2020   PLT 213 08/12/2020   Lab Results  Component Value  Date   IRON 38 10/14/2020   TIBC 317 10/14/2020   FERRITIN 27 10/14/2020   Attestation Statements:   Reviewed by clinician on day of visit: allergies, medications, problem list, medical history, surgical history, family history, social history, and previous encounter notes.  I, Water quality scientist, CMA, am acting as Location manager for [employer], DO  I have reviewed the above documentation for accuracy and completeness, and I agree with the above. Jearld Lesch, DO

## 2021-01-05 ENCOUNTER — Ambulatory Visit: Payer: Medicare Other | Admitting: Nurse Practitioner

## 2021-01-11 ENCOUNTER — Ambulatory Visit: Payer: Medicare Other | Admitting: Adult Health

## 2021-01-11 ENCOUNTER — Telehealth: Payer: Self-pay | Admitting: Nurse Practitioner

## 2021-01-11 ENCOUNTER — Other Ambulatory Visit: Payer: Medicare Other

## 2021-01-11 DIAGNOSIS — E1151 Type 2 diabetes mellitus with diabetic peripheral angiopathy without gangrene: Secondary | ICD-10-CM

## 2021-01-11 DIAGNOSIS — N184 Chronic kidney disease, stage 4 (severe): Secondary | ICD-10-CM

## 2021-01-11 MED ORDER — SITAGLIPTIN PHOSPHATE 50 MG PO TABS: 50.0000 mg | ORAL_TABLET | Freq: Every day | ORAL | 1 refills | Status: DC

## 2021-01-11 NOTE — Telephone Encounter (Signed)
Decline in renal function per [facility]: Dr. Harrie Jeans. She recommended for Januvia dose to be decreased. Are you still taking [place] 100mg ? If yes, decrease dose to 50MG . Schedule lab appt for repeat BMP in 18month.

## 2021-01-13 ENCOUNTER — Other Ambulatory Visit: Payer: Self-pay

## 2021-01-13 ENCOUNTER — Inpatient Hospital Stay: Payer: Medicare Other | Attending: Hematology and Oncology

## 2021-01-13 ENCOUNTER — Telehealth: Payer: Self-pay | Admitting: Adult Health

## 2021-01-13 ENCOUNTER — Inpatient Hospital Stay: Payer: Medicare Other | Admitting: Adult Health

## 2021-01-13 VITALS — BP 141/53 | HR 64 | Temp 97.3°F | Resp 18 | Ht 66.0 in | Wt 351.2 lb

## 2021-01-13 DIAGNOSIS — J849 Interstitial pulmonary disease, unspecified: Secondary | ICD-10-CM | POA: Diagnosis not present

## 2021-01-13 DIAGNOSIS — K219 Gastro-esophageal reflux disease without esophagitis: Secondary | ICD-10-CM | POA: Insufficient documentation

## 2021-01-13 DIAGNOSIS — M79605 Pain in left leg: Secondary | ICD-10-CM | POA: Diagnosis not present

## 2021-01-13 DIAGNOSIS — D509 Iron deficiency anemia, unspecified: Secondary | ICD-10-CM | POA: Diagnosis not present

## 2021-01-13 DIAGNOSIS — N184 Chronic kidney disease, stage 4 (severe): Secondary | ICD-10-CM | POA: Diagnosis not present

## 2021-01-13 DIAGNOSIS — G4733 Obstructive sleep apnea (adult) (pediatric): Secondary | ICD-10-CM | POA: Insufficient documentation

## 2021-01-13 DIAGNOSIS — E785 Hyperlipidemia, unspecified: Secondary | ICD-10-CM | POA: Insufficient documentation

## 2021-01-13 DIAGNOSIS — I130 Hypertensive heart and chronic kidney disease with heart failure and stage 1 through stage 4 chronic kidney disease, or unspecified chronic kidney disease: Secondary | ICD-10-CM | POA: Insufficient documentation

## 2021-01-13 DIAGNOSIS — I5032 Chronic diastolic (congestive) heart failure: Secondary | ICD-10-CM | POA: Insufficient documentation

## 2021-01-13 DIAGNOSIS — M1711 Unilateral primary osteoarthritis, right knee: Secondary | ICD-10-CM | POA: Diagnosis not present

## 2021-01-13 DIAGNOSIS — E1122 Type 2 diabetes mellitus with diabetic chronic kidney disease: Secondary | ICD-10-CM | POA: Insufficient documentation

## 2021-01-13 DIAGNOSIS — E1142 Type 2 diabetes mellitus with diabetic polyneuropathy: Secondary | ICD-10-CM | POA: Diagnosis not present

## 2021-01-13 DIAGNOSIS — M79604 Pain in right leg: Secondary | ICD-10-CM | POA: Insufficient documentation

## 2021-01-13 DIAGNOSIS — D508 Other iron deficiency anemias: Secondary | ICD-10-CM | POA: Diagnosis not present

## 2021-01-13 LAB — CBC WITH DIFFERENTIAL (CANCER CENTER ONLY)
Abs Immature Granulocytes: 0.01 10*3/uL (ref 0.00–0.07)
Basophils Absolute: 0 10*3/uL (ref 0.0–0.1)
Basophils Relative: 1 %
Eosinophils Absolute: 0.1 10*3/uL (ref 0.0–0.5)
Eosinophils Relative: 3 %
HCT: 38.8 % (ref 36.0–46.0)
Hemoglobin: 11.7 g/dL — ABNORMAL LOW (ref 12.0–15.0)
Immature Granulocytes: 0 %
Lymphocytes Relative: 24 %
Lymphs Abs: 1 10*3/uL (ref 0.7–4.0)
MCH: 27 pg (ref 26.0–34.0)
MCHC: 30.2 g/dL (ref 30.0–36.0)
MCV: 89.4 fL (ref 80.0–100.0)
Monocytes Absolute: 0.4 10*3/uL (ref 0.1–1.0)
Monocytes Relative: 9 %
Neutro Abs: 2.6 10*3/uL (ref 1.7–7.7)
Neutrophils Relative %: 63 %
Platelet Count: 200 10*3/uL (ref 150–400)
RBC: 4.34 MIL/uL (ref 3.87–5.11)
RDW: 16.8 % — ABNORMAL HIGH (ref 11.5–15.5)
WBC Count: 4.1 10*3/uL (ref 4.0–10.5)
nRBC: 0 % (ref 0.0–0.2)

## 2021-01-13 LAB — CMP (CANCER CENTER ONLY)
ALT: 9 U/L (ref 0–44)
AST: 11 U/L — ABNORMAL LOW (ref 15–41)
Albumin: 3.5 g/dL (ref 3.5–5.0)
Alkaline Phosphatase: 70 U/L (ref 38–126)
Anion gap: 9 (ref 5–15)
BUN: 25 mg/dL — ABNORMAL HIGH (ref 8–23)
CO2: 27 mmol/L (ref 22–32)
Calcium: 9.1 mg/dL (ref 8.9–10.3)
Chloride: 105 mmol/L (ref 98–111)
Creatinine: 2.05 mg/dL — ABNORMAL HIGH (ref 0.44–1.00)
GFR, Estimated: 26 mL/min — ABNORMAL LOW (ref >60)
Glucose, Bld: 95 mg/dL (ref 70–99)
Potassium: 4.2 mmol/L (ref 3.5–5.1)
Sodium: 141 mmol/L (ref 135–145)
Total Bilirubin: 0.4 mg/dL (ref 0.3–1.2)
Total Protein: 8 g/dL (ref 6.5–8.1)

## 2021-01-13 LAB — IRON AND TIBC
Iron: 51 ug/dL (ref 41–142)
Saturation Ratios: 17 % — ABNORMAL LOW (ref 21–57)
TIBC: 295 ug/dL (ref 236–444)
UIBC: 244 ug/dL (ref 120–384)

## 2021-01-13 LAB — RETIC PANEL
Immature Retic Fract: 15.6 % (ref 2.3–15.9)
RBC.: 4.28 MIL/uL (ref 3.87–5.11)
Retic Count, Absolute: 57.4 10*3/uL (ref 19.0–186.0)
Retic Ct Pct: 1.3 % (ref 0.4–3.1)
Reticulocyte Hemoglobin: 30.2 pg (ref >27.9)

## 2021-01-13 LAB — FERRITIN: Ferritin: 37 ng/mL (ref 11–307)

## 2021-01-13 NOTE — Assessment & Plan Note (Signed)
This is a pleasant 70 year old woman who is here today for continued evaluation and follow-up of her iron deficiency anemia that was diagnosed by Dr. Trisha Mangle [facility] in October, 2022.  1.  Iron deficiency anemia: Her hemoglobin has improved as well as her ferritin level.  She continues on oral iron and is tolerating this well.  I recommended that she continue this.  2.  Stage IV chronic kidney disease.  Currently this is not the cause of her anemia.  I reviewed with her that in cases of chronic kidney disease the kidney can secrete reduced amounts of erythropoietin which stimulate red blood cell production.  We will continue to monitor her hemoglobin and reticulocyte count.  Should her iron levels remain stable and her hemoglobin decline she may benefit from erythrocyte stimulating agents.  At this point her hemoglobin is above 11 and these are not indicated.    Stacey Page is here today for follow-up of her iron deficiency anemia.  We reviewed her labs in detail and we discussed the above in detail her follow-up recommendations include lab checks every 3 months.  We will follow-up with her in 1 year.  Should she feel that she needs to be seen sooner or have labs checked more frequently she will let us know we are always happy to accommodate any adjustments to her schedule.

## 2021-01-13 NOTE — Progress Notes (Signed)
[facility] Follow up:    [name], [name], NP [address] [place] [address]   DIAGNOSIS: Iron deficiency anemia.  SUMMARY OF HEMATOLOGIC HISTORY: 1.  On October 14, 2020: Patient with anemia panel showing hematocrit of 32.9, normal U04, normal folic acid, ferritin of 27, iron saturation of 12%, reticulocyte 1.2%.  (A) cologuard normal  (B) Chronic Kidney Disease: stage IV  (C) recommended oral iron and started on Ferrous Sulfate 324mg  BID in 11/2020  CURRENT THERAPY: oral iron  INTERVAL HISTORY: Sherlyn Ebbert 70 y.o. female returns for evaluation of her iron deficiency anemia.  We reviewed her initial diagnosis.  She tells me that she is taken ferrous sulfate twice a day with good tolerance.  She is not having any stomach upset indigestion.  She has no signs of bleeding anywhere.  We did review her elevated kidney function today which shows GFR of 26.   Patient Active Problem List   Diagnosis Date Noted   Iron deficiency anemia 01/13/2021   CKD (chronic kidney disease) stage 4, GFR 15-29 ml/min ([place]) 07/09/2019   Diabetes mellitus type 2 in obese ([place]) 07/02/2019   Polyneuropathy associated with underlying disease ([place]) 11/28/2018   Severe episode of recurrent major depressive disorder, without psychotic features ([place]) 11/28/2018   Type 2 diabetes mellitus with diabetic peripheral angiopathy without gangrene, without long-term current use of insulin ([place]) 11/28/2018   Poor short term memory 11/28/2018   Mild aortic stenosis by prior echocardiogram 11/06/2018   Symptomatic anemia 03/17/2016   S/P total knee arthroplasty, right 03/17/2016   Unilateral primary osteoarthritis, right knee    Hyperlipidemia LDL goal <70 12/15/2015   Pain in both lower extremities 11/19/2015   Numbness in both hands 08/13/2015   Panic attack 06/09/2015   Right knee pain 05/14/2015   Chronic renal insufficiency 09/16/2014   Pneumothorax on right     Atypical mycobacterium infection    ILD (interstitial lung disease) 2/2 MAI s/p med rxn    Morbid obesity ([place]) 04/03/2014   OSA (obstructive sleep apnea) 02/25/2014   Hypertensive heart disease with chronic diastolic congestive heart failure ([place]) 02/25/2014   DOE (dyspnea on exertion) 02/25/2014   Essential hypertension 02/25/2014    has No Known Allergies.  MEDICAL HISTORY: Past Medical History:  Diagnosis Date   Anxiety    doesn't take any meds   Arthritis of right knee 03/14/2016   Asthma    Back pain    Chronic diastolic CHF (congestive heart failure) (HCC)    HF with Preserved EF (60-65%) - Grade II Diastolic Dysfunction (Hypertensive Heart Disease). takes Furosemide daily   Depression    doesn't take meds   Diabetes ([place])    takes Januvia daily   Eczema    uses cream as needed   GERD (gastroesophageal reflux disease)    History of bronchitis as a child    HTN (hypertension)    Insomnia    Mild aortic stenosis by prior echocardiogram 04/2017   Mild stenosis: Mean gradient 15 mmHg, peak gradient 28 mmHg   Mycobacterium avium-intracellulare infection ([place]) 2016   OA (osteoarthritis)    Obese    OSA (obstructive sleep apnea) 02/25/2014   wears CPAP at night   Peripheral neuropathy    takes Gabapentin as needed   Pneumonia    hx of-2010   Seasonal allergies    uses Flonase daily   Sleep apnea     SURGICAL HISTORY: Past Surgical History:  Procedure Laterality  Date   BREAST BIOPSY Right 03/2018   BREAST LUMPECTOMY WITH RADIOACTIVE SEED LOCALIZATION Right 12/28/2018   Procedure: RIGHT BREAST LUMPECTOMY WITH RADIOACTIVE SEED LOCALIZATION;  Surgeon: Jovita Kussmaul, MD;  Location: [facility];  Service: General;  Laterality: Right;   BREAST SURGERY     CESAREAN SECTION  x2   COLONOSCOPY     CORONARY CALCIUM SCORE & CT ANGIOGRAM  09/2017   Coronary Ca Score = 11 (low).  CTA- no obstructive CAD (minimal disease)   JOINT REPLACEMENT     KNEE ARTHROSCOPY Right    LUNG  BIOPSY Right 06/10/2014   Procedure: LUNG BIOPSY;  Surgeon: Ivin Poot, MD;  Location: [facility];  Service: Thoracic;  Laterality: Right;   POLYPECTOMY     throat   TOTAL KNEE ARTHROPLASTY Right 03/14/2016   Procedure: RIGHT TOTAL KNEE ARTHROPLASTY;  Surgeon: Marybelle Killings, MD;  Location: [facility];  Service: Orthopedics;  Laterality: Right;   TRANSTHORACIC ECHOCARDIOGRAM  11/04/2019   EF 60-65%.  Moderate LVH.  GRII DD.  Mild LV dilation.  Mild LA dilation.  Thickened-calcified aortic valve-Mild AS (mean gradient 13 mmHg, peak 29 mmHg--similar to 2019)   [place] Right 06/10/2014   Procedure: VIDEO ASSISTED THORACOSCOPY;  Surgeon: Ivin Poot, MD;  Location[facility];  Service: Thoracic;  Laterality: Right;    SOCIAL HISTORY: Social History   Socioeconomic History   Marital status: Widowed    Spouse name: Not on file   Number of children: Not on file   Years of education: Not on file   Highest education level: Not on file  Occupational History   Occupation: retired  Tobacco Use   Smoking status: Former    Packs/day: 0.25    Years: 15.00    Pack years: 3.75    Types: Cigarettes    Quit date: 02/08/1980    Years since quitting: 40.9   Smokeless tobacco: Former  Scientific laboratory technician Use: Never used  Substance and Sexual Activity   Alcohol use: Yes    Alcohol/week: 0.0 standard drinks    Comment: occassional/social/rare   Drug use: Not Currently   Sexual activity: Not Currently  Other Topics Concern   Not on file  Social History Narrative   Not on file   Social Determinants of Health   Financial Resource Strain: Low Risk    Difficulty of Paying Living Expenses: Not hard at all  Food Insecurity: Food Insecurity Present   Worried About Charity fundraiser in the Last Year: Sometimes true   Arboriculturist in the Last Year: Sometimes true  Transportation Needs: No Transportation Needs   Lack of Transportation (Medical): No   Lack of Transportation (Non-Medical):  No  Physical Activity: Inactive   Days of Exercise per Week: 0 days   Minutes of Exercise per Session: 0 min  Stress: No Stress Concern Present   Feeling of Stress : Not at all  Social Connections: Moderately Isolated   Frequency of Communication with Friends and Family: More than three times a week   Frequency of Social Gatherings with Friends and Family: Twice a week   Attends Religious Services: More than 4 times per year   Active Member of [employer] or Organizations: No   Attends Archivist Meetings: Never   Marital Status: Widowed  Intimate Partner Violence: Not At Risk   Fear of Current or Ex-Partner: No   Emotionally Abused: No   Physically Abused: No  Sexually Abused: No    FAMILY HISTORY: Family History  Problem Relation Age of Onset   COPD Father    Hypothyroidism Father    Anemia Father        iron deficiency   High blood pressure Father    Alcoholism Father    Cancer Mother 73       pancreatic   High blood pressure Mother    High Cholesterol Mother    Breast cancer Neg Hx     Review of Systems  Constitutional:  Negative for appetite change, chills, fatigue, fever and unexpected weight change.  HENT:   Negative for hearing loss, lump/mass and trouble swallowing.   Eyes:  Negative for eye problems and icterus.  Respiratory:  Negative for chest tightness, cough and shortness of breath.   Cardiovascular:  Negative for chest pain, leg swelling and palpitations.  Gastrointestinal:  Negative for abdominal distention, abdominal pain, constipation, diarrhea, nausea and vomiting.  Endocrine: Negative for hot flashes.  Genitourinary:  Negative for difficulty urinating.   Musculoskeletal:  Negative for arthralgias.  Skin:  Negative for itching and rash.  Neurological:  Negative for dizziness, extremity weakness, headaches and numbness.  Hematological:  Negative for adenopathy. Does not bruise/bleed easily.  Psychiatric/Behavioral:  Negative for depression. The  patient is not nervous/anxious.      PHYSICAL EXAMINATION  ECOG PERFORMANCE STATUS: 1 - Symptomatic but completely ambulatory  Vitals:   01/13/21 1003  BP: (!) 141/53  Pulse: 64  Resp: 18  Temp: (!) 97.3 F (36.3 C)  SpO2: 100%    Physical Exam Constitutional:      General: She is not in acute distress.    Appearance: Normal appearance. She is not toxic-appearing.  HENT:     Head: Normocephalic and atraumatic.  Eyes:     General: No scleral icterus. Cardiovascular:     Rate and Rhythm: Normal rate and regular rhythm.     Pulses: Normal pulses.     Heart sounds: Normal heart sounds.  Pulmonary:     Effort: Pulmonary effort is normal.     Breath sounds: Normal breath sounds.  Abdominal:     General: Abdomen is flat. Bowel sounds are normal. There is no distension.     Palpations: Abdomen is soft.     Tenderness: There is no abdominal tenderness.  Musculoskeletal:        General: No swelling.     Cervical back: Neck supple.  Lymphadenopathy:     Cervical: No cervical adenopathy.  Skin:    General: Skin is warm and dry.     Findings: No rash.  Neurological:     General: No focal deficit present.     Mental Status: She is alert.  Psychiatric:        Mood and Affect: Mood normal.        Behavior: Behavior normal.    LABORATORY DATA:  CBC    Component Value Date/Time   WBC 4.1 01/13/2021 0910   WBC 5.3 08/12/2020 1933   RBC 4.34 01/13/2021 0910   RBC 4.28 01/13/2021 0910   HGB 11.7 (L) 01/13/2021 0910   HCT 38.8 01/13/2021 0910   HCT 32.9 (L) 10/14/2020 1057   PLT 200 01/13/2021 0910   MCV 89.4 01/13/2021 0910   MCH 27.0 01/13/2021 0910   MCHC 30.2 01/13/2021 0910   RDW 16.8 (H) 01/13/2021 0910   LYMPHSABS 1.0 01/13/2021 0910   MONOABS 0.4 01/13/2021 0910   EOSABS 0.1 01/13/2021 0910  BASOSABS 0.0 01/13/2021 0910    CMP     Component Value Date/Time   NA 141 01/13/2021 0910   NA 140 10/14/2020 1057   K 4.2 01/13/2021 0910   CL 105 01/13/2021  0910   CO2 27 01/13/2021 0910   GLUCOSE 95 01/13/2021 0910   BUN 25 (H) 01/13/2021 0910   BUN 23 10/14/2020 1057   CREATININE 2.05 (H) 01/13/2021 0910   CREATININE 1.36 (H) 03/19/2015 1030   CALCIUM 9.1 01/13/2021 0910   PROT 8.0 01/13/2021 0910   PROT 7.6 10/14/2020 1057   ALBUMIN 3.5 01/13/2021 0910   ALBUMIN 4.2 10/14/2020 1057   AST 11 (L) 01/13/2021 0910   ALT 9 01/13/2021 0910   ALKPHOS 70 01/13/2021 0910   BILITOT 0.4 01/13/2021 0910   GFRNONAA 26 (L) 01/13/2021 0910   GFRAA 40 (L) 10/28/2019 0931     ASSESSMENT and THERAPY PLAN:   Iron deficiency anemia This is a pleasant 70 year old woman who is here today for continued evaluation and follow-up of her iron deficiency anemia that was diagnosed by Dr. Trisha Mangle [facility] in October, 2022.  1.  Iron deficiency anemia: Her hemoglobin has improved as well as her ferritin level.  She continues on oral iron and is tolerating this well.  I recommended that she continue this.  2.  Stage IV chronic kidney disease.  Currently this is not the cause of her anemia.  I reviewed with her that in cases of chronic kidney disease the kidney can secrete reduced amounts of erythropoietin which stimulate red blood cell production.  We will continue to monitor her hemoglobin and reticulocyte count.  Should her iron levels remain stable and her hemoglobin decline she may benefit from erythrocyte stimulating agents.  At this point her hemoglobin is above 11 and these are not indicated.    Leonides Sake is here today for follow-up of her iron deficiency anemia.  We reviewed her labs in detail and we discussed the above in detail her follow-up recommendations include lab checks every 3 months.  We will follow-up with her in 1 year.  Should she feel that she needs to be seen sooner or have labs checked more frequently she will let us know we are always happy to accommodate any adjustments to her schedule.     All questions were answered. The patient knows to call the  clinic with any problems, questions or concerns. We can certainly see the patient much sooner if necessary.  Total encounter time: 20 minutes in face-to-face visit time, chart review, lab review, care coordination, and documentation of the encounter.  Wilber Bihari, NP 01/13/21 6:50 PM Medical Oncology and Hematology [facility] [place],  [address] Tel. [phone]    Fax. [phone]  *Total Encounter Time as defined by the Centers for Medicare and Medicaid Services includes, in addition to the face-to-face time of a patient visit (documented in the note above) non-face-to-face time: obtaining and reviewing outside history, ordering and reviewing medications, tests or procedures, care coordination (communications with other health care professionals or caregivers) and documentation in the medical record.

## 2021-01-13 NOTE — Telephone Encounter (Signed)
Scheduled appointment per 12/7 los. Patient is aware. 

## 2021-01-17 ENCOUNTER — Other Ambulatory Visit: Payer: Self-pay | Admitting: Nurse Practitioner

## 2021-01-17 DIAGNOSIS — J309 Allergic rhinitis, unspecified: Secondary | ICD-10-CM

## 2021-01-17 DIAGNOSIS — I10 Essential (primary) hypertension: Secondary | ICD-10-CM

## 2021-01-20 NOTE — Telephone Encounter (Signed)
Chart supports Rx Last seen 12/22/20 Next OV 02/16/21

## 2021-01-25 ENCOUNTER — Other Ambulatory Visit: Payer: Medicare Other

## 2021-02-10 ENCOUNTER — Other Ambulatory Visit: Payer: Self-pay

## 2021-02-10 ENCOUNTER — Encounter (INDEPENDENT_AMBULATORY_CARE_PROVIDER_SITE_OTHER): Payer: Self-pay | Admitting: Bariatrics

## 2021-02-10 ENCOUNTER — Telehealth: Payer: Medicare Other

## 2021-02-10 ENCOUNTER — Other Ambulatory Visit: Payer: Self-pay | Admitting: Nurse Practitioner

## 2021-02-10 ENCOUNTER — Other Ambulatory Visit: Payer: Self-pay | Admitting: Cardiology

## 2021-02-10 ENCOUNTER — Ambulatory Visit (INDEPENDENT_AMBULATORY_CARE_PROVIDER_SITE_OTHER): Payer: No Typology Code available for payment source | Admitting: Bariatrics

## 2021-02-10 VITALS — BP 145/86 | HR 70 | Temp 97.8°F | Ht 66.0 in | Wt 346.0 lb

## 2021-02-10 DIAGNOSIS — I110 Hypertensive heart disease with heart failure: Secondary | ICD-10-CM | POA: Diagnosis not present

## 2021-02-10 DIAGNOSIS — Z6841 Body Mass Index (BMI) 40.0 and over, adult: Secondary | ICD-10-CM

## 2021-02-10 DIAGNOSIS — E1151 Type 2 diabetes mellitus with diabetic peripheral angiopathy without gangrene: Secondary | ICD-10-CM | POA: Diagnosis not present

## 2021-02-10 DIAGNOSIS — E785 Hyperlipidemia, unspecified: Secondary | ICD-10-CM

## 2021-02-10 DIAGNOSIS — I5032 Chronic diastolic (congestive) heart failure: Secondary | ICD-10-CM

## 2021-02-11 ENCOUNTER — Ambulatory Visit: Payer: Medicare Other | Admitting: Sports Medicine

## 2021-02-11 NOTE — Progress Notes (Signed)
Chief Complaint:   OBESITY Stacey Page is here to discuss her progress with her obesity treatment plan along with follow-up of her obesity related diagnoses. Stacey Page is on the Category 1 Plan and states she is following her eating plan approximately 50% of the time. Stacey Page states she is walking for 10 minutes 3 times per week.  Today's visit was #: 74 Starting weight: 344 lbs Starting date: 03/25/2019 Today's weight: 346 lbs Today's date: 02/10/2021 Total lbs lost to date: 0 Total lbs lost since last in-office visit: 1 lb  Interim History: Jette is down 1 lb over the holidays. She wants to focus on other people.   Subjective:   1. Hypertensive heart disease with chronic diastolic congestive heart failure ([place]) Stacey Page is currently taking Lasix, Coreg, Lipitor, hydralazine, Zestril and Januvia.   2. Type 2 diabetes mellitus with diabetic peripheral angiopathy without gangrene, without long-term current use of insulin ([place]) Stacey Page is taking Januvia currently.   Assessment/Plan:   1. Hypertensive heart disease with chronic diastolic congestive heart failure ([place]) Stacey Page will continue taking her medications. She is working on healthy weight loss and exercise to improve blood pressure control. We will watch for signs of hypotension as she continues her lifestyle modifications.  2. Type 2 diabetes mellitus with diabetic peripheral angiopathy without gangrene, without long-term current use of insulin ([place]) Stacey Page will continue taking her medications. Good blood sugar control is important to decrease the likelihood of diabetic complications such as nephropathy, neuropathy, limb loss, blindness, coronary artery disease, and death. Intensive lifestyle modification including diet, exercise and weight loss are the first line of treatment for diabetes.   3. Obesity, current BMI 55.8 Stacey Page is currently in the action stage of change. As such, her goal is to continue with  weight loss efforts. She has agreed to the Category 1 Plan.   Exercise goals:  [facility] She will put more time and effort in. She will increase vegetables.   Behavioral modification strategies: increasing lean protein intake, decreasing simple carbohydrates, increasing vegetables, increasing water intake, decreasing eating out, no skipping meals, meal planning and cooking strategies, keeping healthy foods in the home, and planning for success.  Stacey Page has agreed to follow-up with our clinic in 3-4 weeks. She was informed of the importance of frequent follow-up visits to maximize her success with intensive lifestyle modifications for her multiple health conditions.   Objective:   Blood pressure (!) 145/86, pulse 70, temperature 97.8 F (36.6 C), height 5\' 6"  (1.676 m), weight (!) 346 lb (156.9 kg), SpO2 94 %. Body mass index is 55.85 kg/m.  General: Cooperative, alert, well developed, in no acute distress. HEENT: Conjunctivae and lids unremarkable. Cardiovascular: Regular rhythm.  Lungs: Normal work of breathing. Neurologic: No focal deficits.   Lab Results  Component Value Date   CREATININE 2.05 (H) 01/13/2021   BUN 25 (H) 01/13/2021   NA 141 01/13/2021   K 4.2 01/13/2021   CL 105 01/13/2021   CO2 27 01/13/2021   Lab Results  Component Value Date   ALT 9 01/13/2021   AST 11 (L) 01/13/2021   ALKPHOS 70 01/13/2021   BILITOT 0.4 01/13/2021   Lab Results  Component Value Date   HGBA1C 6.1 (H) 10/14/2020   HGBA1C 6.1 05/14/2020   HGBA1C 6.0 (H) 10/28/2019   HGBA1C 5.9 (H) 07/04/2019   HGBA1C 6.0 (H) 03/25/2019   Lab Results  Component Value Date   INSULIN 8.6 10/28/2019   INSULIN  9.2 07/04/2019   INSULIN 8.8 03/25/2019   Lab Results  Component Value Date   TSH 0.51 11/28/2018   Lab Results  Component Value Date   CHOL 116 05/14/2020   HDL 51.60 05/14/2020   LDLCALC 52 05/14/2020   TRIG 61.0 05/14/2020   CHOLHDL 2 05/14/2020    Lab Results  Component Value Date   VD25OH 57.6 10/28/2019   VD25OH 41.5 07/04/2019   VD25OH 38.2 03/25/2019   Lab Results  Component Value Date   WBC 4.1 01/13/2021   HGB 11.7 (L) 01/13/2021   HCT 38.8 01/13/2021   MCV 89.4 01/13/2021   PLT 200 01/13/2021   Lab Results  Component Value Date   IRON 51 01/13/2021   TIBC 295 01/13/2021   FERRITIN 37 01/13/2021   Attestation Statements:   Reviewed by clinician on day of visit: allergies, medications, problem list, medical history, surgical history, family history, social history, and previous encounter notes.  I, Lizbeth Bark, RMA, am acting as Location manager for [employer], DO.  I have reviewed the above documentation for accuracy and completeness, and I agree with the above. Jearld Lesch, DO

## 2021-02-15 ENCOUNTER — Telehealth: Payer: Self-pay

## 2021-02-15 ENCOUNTER — Encounter (INDEPENDENT_AMBULATORY_CARE_PROVIDER_SITE_OTHER): Payer: Self-pay | Admitting: Bariatrics

## 2021-02-15 DIAGNOSIS — E785 Hyperlipidemia, unspecified: Secondary | ICD-10-CM

## 2021-02-15 DIAGNOSIS — I10 Essential (primary) hypertension: Secondary | ICD-10-CM

## 2021-02-15 DIAGNOSIS — E1151 Type 2 diabetes mellitus with diabetic peripheral angiopathy without gangrene: Secondary | ICD-10-CM

## 2021-02-15 DIAGNOSIS — J309 Allergic rhinitis, unspecified: Secondary | ICD-10-CM

## 2021-02-15 DIAGNOSIS — G63 Polyneuropathy in diseases classified elsewhere: Secondary | ICD-10-CM

## 2021-02-15 NOTE — Progress Notes (Signed)
Patient requested to transfer her medications back to [facility] due to cost.Patient is also asking to get lisinopril and montelukast fill and deliver by the end of the week.Completed Onboarding  form and sent to clinical pharmacist for review.  I reach out to [facility] to request a profile transfer [facility] on 02/15/2021.Marland KitchenPer [facility], I can not request a transfer since I'm not a license pharmacist, they would need to speak to a license pharmacist.Notified Clinical pharmacist.  I reach out to patient cardiology office to request a refill for furosemide and Carvedilol to [facility] on 02/16/2021.  [facility] Pharmacist Assistant [phone]

## 2021-02-16 ENCOUNTER — Other Ambulatory Visit (INDEPENDENT_AMBULATORY_CARE_PROVIDER_SITE_OTHER): Payer: No Typology Code available for payment source

## 2021-02-16 ENCOUNTER — Other Ambulatory Visit: Payer: Self-pay

## 2021-02-16 ENCOUNTER — Telehealth: Payer: Self-pay | Admitting: Cardiology

## 2021-02-16 DIAGNOSIS — N184 Chronic kidney disease, stage 4 (severe): Secondary | ICD-10-CM | POA: Diagnosis not present

## 2021-02-16 DIAGNOSIS — E1151 Type 2 diabetes mellitus with diabetic peripheral angiopathy without gangrene: Secondary | ICD-10-CM | POA: Diagnosis not present

## 2021-02-16 LAB — RENAL FUNCTION PANEL
Albumin: 4 g/dL (ref 3.5–5.2)
BUN: 27 mg/dL — ABNORMAL HIGH (ref 6–23)
CO2: 31 mEq/L (ref 19–32)
Calcium: 9.5 mg/dL (ref 8.4–10.5)
Chloride: 101 mEq/L (ref 96–112)
Creatinine, Ser: 1.9 mg/dL — ABNORMAL HIGH (ref 0.40–1.20)
GFR: 26.66 mL/min — ABNORMAL LOW (ref >60.00)
Glucose, Bld: 98 mg/dL (ref 70–99)
Phosphorus: 3.7 mg/dL (ref 2.3–4.6)
Potassium: 4.4 mEq/L (ref 3.5–5.1)
Sodium: 138 mEq/L (ref 135–145)

## 2021-02-16 MED ORDER — ATORVASTATIN CALCIUM 20 MG PO TABS: ORAL_TABLET | ORAL | 3 refills | Status: DC

## 2021-02-16 MED ORDER — GABAPENTIN 100 MG PO CAPS: 100.0000 mg | ORAL_CAPSULE | Freq: Two times a day (BID) | ORAL | 1 refills | Status: DC

## 2021-02-16 MED ORDER — MONTELUKAST SODIUM 10 MG PO TABS: 10.0000 mg | ORAL_TABLET | Freq: Every day | ORAL | 3 refills | Status: DC

## 2021-02-16 MED ORDER — HYDRALAZINE HCL 25 MG PO TABS: 25.0000 mg | ORAL_TABLET | Freq: Three times a day (TID) | ORAL | 3 refills | Status: DC

## 2021-02-16 MED ORDER — CARVEDILOL 25 MG PO TABS: 25.0000 mg | ORAL_TABLET | Freq: Two times a day (BID) | ORAL | 3 refills | Status: DC

## 2021-02-16 MED ORDER — SITAGLIPTIN PHOSPHATE 50 MG PO TABS: 50.0000 mg | ORAL_TABLET | Freq: Every day | ORAL | 1 refills | Status: DC

## 2021-02-16 MED ORDER — LISINOPRIL 20 MG PO TABS: ORAL_TABLET | ORAL | 3 refills | Status: DC

## 2021-02-16 MED ORDER — FUROSEMIDE 40 MG PO TABS: ORAL_TABLET | ORAL | 3 refills | Status: DC

## 2021-02-16 NOTE — Telephone Encounter (Signed)
Refill sent to [facility].

## 2021-02-16 NOTE — Telephone Encounter (Signed)
Verbal consent obtained for [facility] enhanced pharmacy services (medication synchronization, adherence packaging, delivery coordination). A medication sync plan was created to allow patient to get all medications delivered once every 30 to 90 days per patient preference. Patient understands they have freedom to choose pharmacy and clinical pharmacist will coordinate care between all prescribers and [facility].   Refills of chronic medications sent into [facility] and pharmacy updated. HC to reach out to cardiology regarding furosemide and carvedilol refills.

## 2021-02-16 NOTE — Progress Notes (Signed)
Per Wilfred Lacy, NP pt is here for labs, pt tolerated draw well.

## 2021-02-16 NOTE — Telephone Encounter (Signed)
°*  STAT* If patient is at the pharmacy, call can be transferred to refill team.   1. Which medications need to be refilled? (please list name of each medication and dose if known) new prescriptions, changing pharmacy  Furosemide and Carvedilol for  2. Which pharmacy/location (including street and city if local pharmacy) is medication to be sent to?[facility]  3. Do they need a 30 day or 90 day supply? 90 days and refills

## 2021-02-24 ENCOUNTER — Telehealth: Payer: Self-pay | Admitting: Nurse Practitioner

## 2021-02-24 NOTE — Telephone Encounter (Signed)
Pt came by and dropped off form for [facility] to sign, she requested call at [phone] when its ready

## 2021-02-24 NOTE — Telephone Encounter (Signed)
Forms placed on provider desk for completion, will contact patient if further information is needed or when it is completed.

## 2021-02-25 ENCOUNTER — Ambulatory Visit (INDEPENDENT_AMBULATORY_CARE_PROVIDER_SITE_OTHER): Payer: No Typology Code available for payment source | Admitting: Sports Medicine

## 2021-02-25 ENCOUNTER — Other Ambulatory Visit: Payer: Self-pay

## 2021-02-25 ENCOUNTER — Encounter: Payer: Self-pay | Admitting: Sports Medicine

## 2021-02-25 DIAGNOSIS — E1142 Type 2 diabetes mellitus with diabetic polyneuropathy: Secondary | ICD-10-CM

## 2021-02-25 DIAGNOSIS — M2041 Other hammer toe(s) (acquired), right foot: Secondary | ICD-10-CM

## 2021-02-25 DIAGNOSIS — M79674 Pain in right toe(s): Secondary | ICD-10-CM

## 2021-02-25 DIAGNOSIS — E1151 Type 2 diabetes mellitus with diabetic peripheral angiopathy without gangrene: Secondary | ICD-10-CM

## 2021-02-25 DIAGNOSIS — B351 Tinea unguium: Secondary | ICD-10-CM

## 2021-02-25 DIAGNOSIS — M2042 Other hammer toe(s) (acquired), left foot: Secondary | ICD-10-CM

## 2021-02-25 DIAGNOSIS — M2141 Flat foot [pes planus] (acquired), right foot: Secondary | ICD-10-CM

## 2021-02-25 DIAGNOSIS — M79675 Pain in left toe(s): Secondary | ICD-10-CM | POA: Diagnosis not present

## 2021-02-25 DIAGNOSIS — M2142 Flat foot [pes planus] (acquired), left foot: Secondary | ICD-10-CM

## 2021-02-25 NOTE — Progress Notes (Signed)
Subjective: Stacey Page is a 71 y.o. female patient with history of diabetes who presents to office today complaining of long,mildly painful nails and painful callus while ambulating in shoes; unable to trim. Patient states that she is doing good does have some dry callused skin to both feet like before. Patient is assisted by sister who will also be seen this visit.  Fasting blood sugar not recorded. Patient Active Problem List   Diagnosis Date Noted   Iron deficiency anemia 01/13/2021   CKD (chronic kidney disease) stage 4, GFR 15-29 ml/min ([place]) 07/09/2019   Diabetes mellitus type 2 in obese ([place]) 07/02/2019   Polyneuropathy associated with underlying disease ([place]) 11/28/2018   Severe episode of recurrent major depressive disorder, without psychotic features ([place]) 11/28/2018   Type 2 diabetes mellitus with diabetic peripheral angiopathy without gangrene, without long-term current use of insulin ([place]) 11/28/2018   Poor short term memory 11/28/2018   Mild aortic stenosis by prior echocardiogram 11/06/2018   Symptomatic anemia 03/17/2016   S/P total knee arthroplasty, right 03/17/2016   Unilateral primary osteoarthritis, right knee    Hyperlipidemia LDL goal <70 12/15/2015   Pain in both lower extremities 11/19/2015   Numbness in both hands 08/13/2015   Panic attack 06/09/2015   Right knee pain 05/14/2015   Chronic renal insufficiency 09/16/2014   Pneumothorax on right    Atypical mycobacterium infection    ILD (interstitial lung disease) 2/2 MAI s/p med rxn    Morbid obesity ([place]) 04/03/2014   OSA (obstructive sleep apnea) 02/25/2014   Hypertensive heart disease with chronic diastolic congestive heart failure ([place]) 02/25/2014   DOE (dyspnea on exertion) 02/25/2014   Essential hypertension 02/25/2014   Current Outpatient Medications on File Prior to Visit  Medication Sig Dispense Refill   acetaminophen (TYLENOL) 500 MG tablet Take 1 tablet (500 mg total) by mouth every 6 (six)  hours as needed. 30 tablet 0   albuterol (PROAIR HFA) 108 (90 Base) MCG/ACT inhaler Inhale 2 puffs into the lungs every 6 (six) hours as needed for wheezing or shortness of breath. 1 each 3   aspirin 81 MG tablet Take 1 tablet (81 mg total) by mouth daily with breakfast. 90 tablet 0   atorvastatin (LIPITOR) 20 MG tablet TAKE 1 TABLET BY MOUTH  DAILY AT 6 PM. 90 tablet 3   carvedilol (COREG) 25 MG tablet Take 1 tablet (25 mg total) by mouth 2 (two) times daily. 180 tablet 3   Clobetasol Prop Emollient Base (CLOBETASOL PROPIONATE E) 0.05 % emollient cream Apply 1 application topically daily as needed (skin irritations). 30 g 2   fluticasone (FLONASE) 50 MCG/ACT nasal spray Place into both nostrils.     furosemide (LASIX) 40 MG tablet TAKE 1 TAB FOUR TIMES PER WEEK, TAKE AN ADDITIONAL TAB FOR WEIGHT GAIN OF 2 LBS OVERNIGHT OR 5 LBS IN ONE WEEK 180 tablet 3   gabapentin (NEURONTIN) 100 MG capsule Take 1 capsule (100 mg total) by mouth 2 (two) times daily. 180 capsule 1   hydrALAZINE (APRESOLINE) 25 MG tablet Take 1 tablet (25 mg total) by mouth 3 (three) times daily. 270 tablet 3   lisinopril (ZESTRIL) 20 MG tablet TAKE 1 TABLET BY MOUTH ONCE DAILY WITH BREAKFAST 90 tablet 3   mometasone (NASONEX) 50 MCG/ACT nasal spray Place 2 sprays into the nose daily. 17 g 11   montelukast (SINGULAIR) 10 MG tablet Take 1 tablet (10 mg total) by mouth at bedtime. 90 tablet 3   sitaGLIPtin (JANUVIA) 50  MG tablet Take 1 tablet (50 mg total) by mouth daily. 90 tablet 1   vitamin C (ASCORBIC ACID) 500 MG tablet Take 500 mg by mouth daily.      No current facility-administered medications on file prior to visit.   No Known Allergies  Recent Results (from the past 2160 hour(s))  CBC with Differential ([facility] Only)     Status: Abnormal   Collection Time: 01/13/21  9:10 AM  Result Value Ref Range   WBC Count 4.1 4.0 - 10.5 K/uL   RBC 4.34 3.87 - 5.11 MIL/uL   Hemoglobin 11.7 (L) 12.0 - 15.0 g/dL   HCT 38.8  36.0 - 46.0 %   MCV 89.4 80.0 - 100.0 fL   MCH 27.0 26.0 - 34.0 pg   MCHC 30.2 30.0 - 36.0 g/dL   RDW 16.8 (H) 11.5 - 15.5 %   Platelet Count 200 150 - 400 K/uL   nRBC 0.0 0.0 - 0.2 %   Neutrophils Relative % 63 %   Neutro Abs 2.6 1.7 - 7.7 K/uL   Lymphocytes Relative 24 %   Lymphs Abs 1.0 0.7 - 4.0 K/uL   Monocytes Relative 9 %   Monocytes Absolute 0.4 0.1 - 1.0 K/uL   Eosinophils Relative 3 %   Eosinophils Absolute 0.1 0.0 - 0.5 K/uL   Basophils Relative 1 %   Basophils Absolute 0.0 0.0 - 0.1 K/uL   Immature Granulocytes 0 %   Abs Immature Granulocytes 0.01 0.00 - 0.07 K/uL    Comment: Performed at [facility], [address] [place], [place] [address]  CMP ([place] only)     Status: Abnormal   Collection Time: 01/13/21  9:10 AM  Result Value Ref Range   Sodium 141 135 - 145 mmol/L   Potassium 4.2 3.5 - 5.1 mmol/L   Chloride 105 98 - 111 mmol/L   CO2 27 22 - 32 mmol/L   Glucose, Bld 95 70 - 99 mg/dL    Comment: Glucose reference range applies only to samples taken after fasting for at least 8 hours.   BUN 25 (H) 8 - 23 mg/dL   Creatinine 2.05 (H) 0.44 - 1.00 mg/dL   Calcium 9.1 8.9 - 10.3 mg/dL   Total Protein 8.0 6.5 - 8.1 g/dL   Albumin 3.5 3.5 - 5.0 g/dL   AST 11 (L) 15 - 41 U/L   ALT 9 0 - 44 U/L   Alkaline Phosphatase 70 38 - 126 U/L   Total Bilirubin 0.4 0.3 - 1.2 mg/dL   GFR, Estimated 26 (L) >60 mL/min    Comment: (NOTE) Calculated using the CKD-EPI Creatinine Equation (2021)    Anion gap 9 5 - 15    Comment: Performed at [facility], [place] [address], [place], [place] [address]  Iron and TIBC     Status: Abnormal   Collection Time: 01/13/21  9:10 AM  Result Value Ref Range   Iron 51 41 - 142 ug/dL   TIBC 295 236 - 444 ug/dL   Saturation Ratios 17 (L) 21 - 57 %   UIBC 244 120 - 384 ug/dL    Comment: Performed at [facility], [place] [address] [place], [place] [address]  Retic  Panel     Status: None   Collection Time: 01/13/21  9:10 AM  Result Value Ref Range   Retic Ct Pct 1.3 0.4 - 3.1 %   RBC. 4.28 3.87 -  5.11 MIL/uL   Retic Count, Absolute 57.4 19.0 - 186.0 K/uL   Immature Retic Fract 15.6 2.3 - 15.9 %   Reticulocyte Hemoglobin 30.2 >27.9 pg    Comment: Performed at [facility], [place] [address] [place], [place] [address]  Ferritin     Status: None   Collection Time: 01/13/21  9:10 AM  Result Value Ref Range   Ferritin 37 11 - 307 ng/mL    Comment: Performed at [facility], [place] [address], [place], [place] [address]  Renal Function Panel     Status: Abnormal   Collection Time: 02/16/21  8:03 AM  Result Value Ref Range   Sodium 138 135 - 145 mEq/L   Potassium 4.4 3.5 - 5.1 mEq/L   Chloride 101 96 - 112 mEq/L   CO2 31 19 - 32 mEq/L   Albumin 4.0 3.5 - 5.2 g/dL   BUN 27 (H) 6 - 23 mg/dL   Creatinine, Ser 1.90 (H) 0.40 - 1.20 mg/dL   Glucose, Bld 98 70 - 99 mg/dL   Phosphorus 3.7 2.3 - 4.6 mg/dL   GFR 26.66 (L) >60.00 mL/min    Comment: Calculated using the CKD-EPI Creatinine Equation (2021)   Calcium 9.5 8.4 - 10.5 mg/dL    Objective: General: Patient is awake, alert, and oriented x 3 and in no acute distress.  Integument: Skin is warm, dry and supple bilateral. Nails are tender, long, thickened and dystrophic with subungual debris, consistent with onychomycosis, 1-5 bilateral.  There is mild curvature of bilateral hallux nails with no acute signs of infection.  Dry skin to heels.  Vasculature:  Dorsalis Pedis pulse 1/4 bilateral. Posterior Tibial pulse 1/4 bilateral. Capillary fill time <3 sec 1-5 bilateral. Positive hair growth to the level of the digits.Temperature gradient within normal limits. No varicosities present bilateral. No edema present bilateral.   Neurology: Gross sensation present via light touch bilateral.  Musculoskeletal: Asymptomatic pes planus and lesser hammertoe pedal  deformities noted bilateral. Muscular strength 5/5 in all lower extremity muscular groups bilateral without pain on range of motion . No tenderness with calf compression bilateral.  Assessment and Plan: Problem List Items Addressed This Visit   None Visit Diagnoses     Pain due to onychomycosis of toenails of both feet    -  Primary   Diabetic peripheral neuropathy associated with type 2 diabetes mellitus (HCC)       Acquired hammertoes of both feet       Pes planus of both feet       Type II diabetes mellitus with peripheral circulatory disorder (HCC)           -Examined patient. -Re-Discussed and educated patient on diabetic foot care, especially with  regards to the vascular, neurological and musculoskeletal systems.  -Mechanically debrided all nails 1-5 bilateral using sterile nail nipper and filed with dremel without incident  -Continue with good supportive shoes and for foot type -Dispense padding for patient to use at the undersurface of the tongue in her shoe to prevent pistoning or sliding -Advised patient to continue with daily skin emollients/creams for dry skin -Answered all patient questions -Patient to return  in 2.5 months for at risk foot care or sooner if issues or problems arise meanwhile.  Landis Martins, DPM

## 2021-02-25 NOTE — Patient Instructions (Signed)
Vinegar soaks 1 cup of white distilled vinegar to 8 cups of warm water.  Soak 20 mins. May repeat soak two times per week.  If there is thickness to nails may file nails after soaks or after bath/shower with nail file and apply tea tree oil. Apply oil daily to nails after filing for the best result.  OTC Tinactin or lamisil spray for itchiness

## 2021-03-03 ENCOUNTER — Ambulatory Visit: Payer: Medicare Other | Admitting: Cardiology

## 2021-03-03 NOTE — Progress Notes (Deleted)
Primary Care Provider: Flossie Buffy, NP Cardiologist: Glenetta Hew, MD Electrophysiologist: None  Clinic Note: No chief complaint on file.   ===================================  ASSESSMENT/PLAN   Problem List Items Addressed This Visit       Cardiology Problems   Hypertensive heart disease with chronic diastolic congestive heart failure ([place]) - Primary (Chronic)   Hyperlipidemia associated with type 2 diabetes mellitus ([place]) (Chronic)   Mild aortic stenosis by prior echocardiogram (Chronic)     Other   CKD (chronic kidney disease) stage 4, GFR 15-29 ml/min (HCC)   OSA (obstructive sleep apnea) (Chronic)   Morbid obesity (HCC) (Chronic)    ===================================  HPI:    Stacey Page is a morbidly obese 71 y.o. female with a PMH notable for Hypertensive Heart Disease-HFpEF, mild AS, OSA and DM-2 who presents today for 44-month follow-up.  I last saw Stacey Page in October 2021-she was doing well with no major issues.  No changes made.  She was subsequently seen by Stacey Montana, NP on was seen on September 02, 2020 as an ED follow-up.  She had been to the ER on July 6 secondary to shortness of breath, headache and fever.  Unfortunately she did not stay because of extent of wait time.  She was negative for COVID and flu.  BNP was only mildly elevated.  Chest x-ray showed mild cardiomegaly with peribronchial changes consistent with either edema or pneumonia. => At follow-up, her fevers have subsided and he dyspnea back to baseline.  Difficulty with walking because of foot pain-seeing a podiatrist.  Taking Lasix 3 times a week (Monday Wednesday Friday -> dose reduced by nephrologist) -> No chest pain pressure or tightness.  No orthopnea or PND.  No palpitations. -> Joint pain time with her grandsons (aged 42 and 78) Recommend increasing Lasix to 4 times a week and as needed for weight gain.  Encourage CPAP compliance and weight loss. Provided information on  PREP at [facility]. ->  Was wanting to wait till her foot improved.  Recent Hospitalizations: None  She was just seen by Dr. Owens Shark at the weight management center on January 4-on the Category 1 Plan.  Admitted only eating according to plan for percent of the time.  Walking 10 minutes 3 days a week.  Blood pressure was 145/86.    Reviewed  CV studies:    The following studies were reviewed today: (if available, images/films reviewed: From Epic Chart or Care Everywhere) ***:   Interval History:   Stacey Page   CV Review of Symptoms (Summary) Cardiovascular ROS: {roscv:310661}  REVIEWED OF SYSTEMS   ROS Stable dyspnea; joint pain, dizziness, pedal neuropathy related tingling.  Chronic lower extremity swelling.   I have reviewed and (if needed) personally updated the patient's problem list, medications, allergies, past medical and surgical history, social and family history.   PAST MEDICAL HISTORY   Past Medical History:  Diagnosis Date   Anxiety    doesn't take any meds   Arthritis of right knee 03/14/2016   Asthma    Back pain    Chronic diastolic CHF (congestive heart failure) (HCC)    HF with Preserved EF (60-65%) - Grade II Diastolic Dysfunction (Hypertensive Heart Disease). takes Furosemide daily   Depression    doesn't take meds   Diabetes ([place])    takes Januvia daily   Eczema    uses cream as needed   GERD (gastroesophageal reflux disease)    History of bronchitis as a child  HTN (hypertension)    Insomnia    Mild aortic stenosis by prior echocardiogram 04/2017   Mild stenosis: Mean gradient 15 mmHg, peak gradient 28 mmHg   Mycobacterium avium-intracellulare infection ([place]) 2016   OA (osteoarthritis)    Obese    OSA (obstructive sleep apnea) 02/25/2014   wears CPAP at night   Peripheral neuropathy    takes Gabapentin as needed   Pneumonia    hx of-2010   Seasonal allergies    uses Flonase daily   Sleep apnea     PAST SURGICAL HISTORY   Past  Surgical History:  Procedure Laterality Date   BREAST BIOPSY Right 03/2018   BREAST LUMPECTOMY WITH RADIOACTIVE SEED LOCALIZATION Right 12/28/2018   Procedure: RIGHT BREAST LUMPECTOMY WITH RADIOACTIVE SEED LOCALIZATION;  Surgeon: Jovita Kussmaul, MD;  Location: [place];  Service: General;  Laterality: Right;   BREAST SURGERY     CESAREAN SECTION  x2   COLONOSCOPY     CORONARY CALCIUM SCORE & CT ANGIOGRAM  09/2017   Coronary Ca Score = 11 (low).  CTA- no obstructive CAD (minimal disease)   JOINT REPLACEMENT     KNEE ARTHROSCOPY Right    LUNG BIOPSY Right 06/10/2014   Procedure: LUNG BIOPSY;  Surgeon: Ivin Poot, MD;  Location: [place];  Service: Thoracic;  Laterality: Right;   POLYPECTOMY     throat   TOTAL KNEE ARTHROPLASTY Right 03/14/2016   Procedure: RIGHT TOTAL KNEE ARTHROPLASTY;  Surgeon: Marybelle Killings, MD;  Location: [place];  Service: Orthopedics;  Laterality: Right;   TRANSTHORACIC ECHOCARDIOGRAM  11/04/2019   EF 60-65%.  Moderate LVH.  GRII DD.  Mild LV dilation.  Mild LA dilation.  Thickened-calcified aortic valve-Mild AS (mean gradient 13 mmHg, peak 29 mmHg--similar to 2019)   [place] Right 06/10/2014   Procedure: VIDEO ASSISTED THORACOSCOPY;  Surgeon: Ivin Poot, MD;  [facility];  Service: Thoracic;  Laterality: Right;    Immunization History  Administered Date(s) Administered   Fluad Quad(high Dose 65+) 11/28/2018, 01/29/2020   Influenza Split 11/21/2016   Influenza,inj,Quad PF,6+ Mos 01/12/2015, 11/19/2015   PFIZER(Purple Top)SARS-COV-2 Vaccination 03/31/2019, 04/23/2019, 03/21/2020   Pneumococcal Conjugate-13 01/19/2017   Pneumococcal Polysaccharide-23 01/12/2015, 05/14/2020    MEDICATIONS/ALLERGIES   No outpatient medications have been marked as taking for the 03/03/21 encounter (Appointment) with Leonie Man, MD.    No Known Allergies  SOCIAL HISTORY/FAMILY HISTORY   Reviewed in Epic:  Pertinent findings:  Social History    Tobacco Use   Smoking status: Former    Packs/day: 0.25    Years: 15.00    Pack years: 3.75    Types: Cigarettes    Quit date: 02/08/1980    Years since quitting: 41.0   Smokeless tobacco: Former  Scientific laboratory technician Use: Never used  Substance Use Topics   Alcohol use: Yes    Alcohol/week: 0.0 standard drinks    Comment: occassional/social/rare   Drug use: Not Currently   Social History   Social History Narrative   Not on file    OBJCTIVE -PE, EKG, labs   Wt Readings from Last 3 Encounters:  02/10/21 (!) 346 lb (156.9 kg)  01/13/21 (!) 351 lb 3.2 oz (159.3 kg)  12/29/20 (!) 347 lb (157.4 kg)    Physical Exam: There were no vitals taken for this visit. Physical Exam  Morbidly obese. Decreased pulses-due to body habitus.  2/6 SEM at RUSB. Chest wall tenderness 1+ nonpitting/puffy edema bilaterally  Adult ECG Report  Rate: *** ;  Rhythm: {rhythm:17366};   Narrative Interpretation: ***  Recent Labs:  ***  Lab Results  Component Value Date   CHOL 116 05/14/2020   HDL 51.60 05/14/2020   LDLCALC 52 05/14/2020   TRIG 61.0 05/14/2020   CHOLHDL 2 05/14/2020   Lab Results  Component Value Date   CREATININE 1.90 (H) 02/16/2021   BUN 27 (H) 02/16/2021   NA 138 02/16/2021   K 4.4 02/16/2021   CL 101 02/16/2021   CO2 31 02/16/2021   CBC Latest Ref Rng & Units 01/13/2021 10/14/2020 08/12/2020  WBC 4.0 - 10.5 K/uL 4.1 - 5.3  Hemoglobin 12.0 - 15.0 g/dL 11.7(L) - 10.9(L)  Hematocrit 36.0 - 46.0 % 38.8 32.9(L) 35.5(L)  Platelets 150 - 400 K/uL 200 - 213    Lab Results  Component Value Date   HGBA1C 6.1 (H) 10/14/2020   Lab Results  Component Value Date   TSH 0.51 11/28/2018    ==================================================  COVID-19 Education: The signs and symptoms of COVID-19 were discussed with the patient and how to seek care for testing (follow up with PCP or arrange E-visit).    I spent a total of ***minutes with the patient spent in direct  patient consultation.  Additional time spent with chart review  / charting (studies, outside notes, etc): *** min Total Time: *** min  Current medicines are reviewed at length with the patient today.  (+/- concerns) ***  This visit occurred during the SARS-CoV-2 public health emergency.  Safety protocols were in place, including screening questions prior to the visit, additional usage of staff PPE, and extensive cleaning of exam room while observing appropriate contact time as indicated for disinfecting solutions.  Notice: This dictation was prepared with Dragon dictation along with smart phrase technology. Any transcriptional errors that result from this process are unintentional and may not be corrected upon review.  Studies Ordered:   No orders of the defined types were placed in this encounter.   Patient Instructions / Medication Changes & Studies & Tests Ordered   There are no Patient Instructions on file for this visit.     Glenetta Hew, M.D., M.S. Interventional Cardiologist   Pager # [phone] Phone # [phone] [address] [place], [place] [address]   Thank you for choosing Heartcare at [facility]!!

## 2021-03-04 ENCOUNTER — Ambulatory Visit (INDEPENDENT_AMBULATORY_CARE_PROVIDER_SITE_OTHER): Payer: No Typology Code available for payment source | Admitting: Bariatrics

## 2021-03-04 ENCOUNTER — Other Ambulatory Visit: Payer: Self-pay

## 2021-03-04 ENCOUNTER — Encounter (INDEPENDENT_AMBULATORY_CARE_PROVIDER_SITE_OTHER): Payer: Self-pay | Admitting: Bariatrics

## 2021-03-04 VITALS — BP 146/84 | Temp 97.8°F | Ht 66.0 in | Wt 344.0 lb

## 2021-03-04 DIAGNOSIS — E1151 Type 2 diabetes mellitus with diabetic peripheral angiopathy without gangrene: Secondary | ICD-10-CM

## 2021-03-04 DIAGNOSIS — Z6841 Body Mass Index (BMI) 40.0 and over, adult: Secondary | ICD-10-CM

## 2021-03-04 DIAGNOSIS — E669 Obesity, unspecified: Secondary | ICD-10-CM | POA: Diagnosis not present

## 2021-03-04 DIAGNOSIS — I10 Essential (primary) hypertension: Secondary | ICD-10-CM | POA: Diagnosis not present

## 2021-03-04 NOTE — Progress Notes (Signed)
Chief Complaint:   OBESITY Stacey Page is here to discuss her progress with her obesity treatment plan along with follow-up of her obesity related diagnoses. Stacey Page is on the Category 3 Plan and states she is following her eating plan approximately 10% of the time. Stacey Page states she is walking for 5 minutes 5 times per week.  Today's visit was #: 33 Starting weight: 344 lbs Starting date: 03/25/2019 Today's weight: 342 lbs Today's date: 03/04/2021 Total lbs lost to date: 2 Total lbs lost since last in-office visit: 2  Interim History: Stacey Page is down 2 lbs since her last visit. She is doing well with her water.  Subjective:   1. Essential hypertension Stacey Page's blood pressure is reasonably well controlled. Her last blood pressure was 145/86.  2. Type 2 diabetes mellitus with diabetic peripheral angiopathy without gangrene, without long-term current use of insulin (HCC) Stacey Page is currently taking Januvia 1/2 dose.   Assessment/Plan:   1. Essential hypertension [place] will continue her medications. She is working on healthy weight loss and exercise to improve blood pressure control. We will watch for signs of hypotension as she continues her lifestyle modifications.  2. Type 2 diabetes mellitus with diabetic peripheral angiopathy without gangrene, without long-term current use of insulin (HCC) Stacey Page will continue her medications. She will keep carbohydrates low. She will follow up with primary care provider and nephrologist. Good blood sugar control is important to decrease the likelihood of diabetic complications such as nephropathy, neuropathy, limb loss, blindness, coronary artery disease, and death. Intensive lifestyle modification including diet, exercise and weight loss are the first line of treatment for diabetes.   3. Obesity, current BMI 52.0 Stacey Page is currently in the action stage of change. As such, her goal is to continue with weight loss efforts.  She has agreed to the Category 3 Plan.   Stacey Page will continue meal planning and she will continue intentional eating. She will keep water intake adequate.   Exercise goals:  As is.  Behavioral modification strategies: increasing lean protein intake, decreasing simple carbohydrates, increasing vegetables, increasing water intake, decreasing eating out, no skipping meals, meal planning and cooking strategies, keeping healthy foods in the home, and planning for success.  Stacey Page has agreed to follow-up with our clinic in 4 weeks. She was informed of the importance of frequent follow-up visits to maximize her success with intensive lifestyle modifications for her multiple health conditions.   Objective:   Blood pressure (!) 146/84, temperature 97.8 F (36.6 C), height 5\' 6"  (1.676 m), weight (!) 344 lb (156 kg). Body mass index is 55.52 kg/m.  General: Cooperative, alert, well developed, in no acute distress. HEENT: Conjunctivae and lids unremarkable. Cardiovascular: Regular rhythm.  Lungs: Normal work of breathing. Neurologic: No focal deficits.   Lab Results  Component Value Date   CREATININE 1.90 (H) 02/16/2021   BUN 27 (H) 02/16/2021   NA 138 02/16/2021   K 4.4 02/16/2021   CL 101 02/16/2021   CO2 31 02/16/2021   Lab Results  Component Value Date   ALT 9 01/13/2021   AST 11 (L) 01/13/2021   ALKPHOS 70 01/13/2021   BILITOT 0.4 01/13/2021   Lab Results  Component Value Date   HGBA1C 6.1 (H) 10/14/2020   HGBA1C 6.1 05/14/2020   HGBA1C 6.0 (H) 10/28/2019   HGBA1C 5.9 (H) 07/04/2019   HGBA1C 6.0 (H) 03/25/2019   Lab Results  Component Value Date   INSULIN 8.6 10/28/2019   INSULIN 9.2 07/04/2019   INSULIN  8.8 03/25/2019   Lab Results  Component Value Date   TSH 0.51 11/28/2018   Lab Results  Component Value Date   CHOL 116 05/14/2020   HDL 51.60 05/14/2020   LDLCALC 52 05/14/2020   TRIG 61.0 05/14/2020   CHOLHDL 2 05/14/2020   Lab Results  Component  Value Date   VD25OH 57.6 10/28/2019   VD25OH 41.5 07/04/2019   VD25OH 38.2 03/25/2019   Lab Results  Component Value Date   WBC 4.1 01/13/2021   HGB 11.7 (L) 01/13/2021   HCT 38.8 01/13/2021   MCV 89.4 01/13/2021   PLT 200 01/13/2021   Lab Results  Component Value Date   IRON 51 01/13/2021   TIBC 295 01/13/2021   FERRITIN 37 01/13/2021   Attestation Statements:   Reviewed by clinician on day of visit: allergies, medications, problem list, medical history, surgical history, family history, social history, and previous encounter notes.  I, Lizbeth Bark, RMA, am acting as Location manager for [employer], DO.  I have reviewed the above documentation for accuracy and completeness, and I agree with the above. Jearld Lesch, DO

## 2021-03-08 ENCOUNTER — Encounter (INDEPENDENT_AMBULATORY_CARE_PROVIDER_SITE_OTHER): Payer: Self-pay | Admitting: Bariatrics

## 2021-03-10 ENCOUNTER — Telehealth: Payer: Medicare Other

## 2021-03-11 ENCOUNTER — Ambulatory Visit (INDEPENDENT_AMBULATORY_CARE_PROVIDER_SITE_OTHER): Payer: No Typology Code available for payment source | Admitting: Nurse Practitioner

## 2021-03-11 ENCOUNTER — Other Ambulatory Visit: Payer: Self-pay

## 2021-03-11 VITALS — BP 138/76 | HR 56 | Temp 98.4°F | Resp 10 | Ht 66.0 in | Wt 350.1 lb

## 2021-03-11 DIAGNOSIS — I10 Essential (primary) hypertension: Secondary | ICD-10-CM | POA: Diagnosis not present

## 2021-03-11 NOTE — Progress Notes (Signed)
Acute Office Visit  Subjective:    Patient ID: Stacey Page, female    DOB: September 21, 1950, 71 y.o.   MRN: [id number]  Chief Complaint  Patient presents with   Hypertension    Patient was at dentist office earlier today and her b/p was elevated-200/95, 212/73 and 202/100, she is asymptomatic. Patient does not check her b/p at home     Patient is in today for Hypertensive urgency  States she went to the dentist for consultation to have dental work. She is suppose to have tooth extractions and then eventually get dentures. States that she did not eat nor drink anything this morning prior to the dental appointment. After she was evaluated they recommended that she go to ED. Patient deferred and scheduled a PCP appt. She did take her antihypertensive medications prior to seeing me in office today and BP with in normal limits.   Does have a history of heart murmur, heart failure, DM, and CKD. Form was sent with patient for clearance of procedure. She does not meet requirements for prophylaxis antibiotics. Did recommend that she check her blood pressures at home at least 3 times weekly   Past Medical History:  Diagnosis Date   Anxiety    doesn't take any meds   Arthritis of right knee 03/14/2016   Asthma    Back pain    Chronic diastolic CHF (congestive heart failure) ([place])    HF with Preserved EF (60-65%) - Grade II Diastolic Dysfunction (Hypertensive Heart Disease). takes Furosemide daily   Depression    doesn't take meds   Diabetes ([place])    takes Januvia daily   Eczema    uses cream as needed   GERD (gastroesophageal reflux disease)    History of bronchitis as a child    HTN (hypertension)    Insomnia    Mild aortic stenosis by prior echocardiogram 04/2017   Mild stenosis: Mean gradient 15 mmHg, peak gradient 28 mmHg   Mycobacterium avium-intracellulare infection ([place]) 2016   OA (osteoarthritis)    Obese    OSA (obstructive sleep apnea) 02/25/2014   wears CPAP at night    Peripheral neuropathy    takes Gabapentin as needed   Pneumonia    hx of-2010   Seasonal allergies    uses Flonase daily   Sleep apnea     Past Surgical History:  Procedure Laterality Date   BREAST BIOPSY Right 03/2018   BREAST LUMPECTOMY WITH RADIOACTIVE SEED LOCALIZATION Right 12/28/2018   Procedure: RIGHT BREAST LUMPECTOMY WITH RADIOACTIVE SEED LOCALIZATION;  Surgeon: Jovita Kussmaul, MD;  Location: [facility];  Service: General;  Laterality: Right;   BREAST SURGERY     CESAREAN SECTION  x2   COLONOSCOPY     CORONARY CALCIUM SCORE & CT ANGIOGRAM  09/2017   Coronary Ca Score = 11 (low).  CTA- no obstructive CAD (minimal disease)   JOINT REPLACEMENT     KNEE ARTHROSCOPY Right    LUNG BIOPSY Right 06/10/2014   Procedure: LUNG BIOPSY;  Surgeon: Ivin Poot, MD;  Location: [facility];  Service: Thoracic;  Laterality: Right;   POLYPECTOMY     throat   TOTAL KNEE ARTHROPLASTY Right 03/14/2016   Procedure: RIGHT TOTAL KNEE ARTHROPLASTY;  Surgeon: Marybelle Killings, MD;  Location: [facility];  Service: Orthopedics;  Laterality: Right;   TRANSTHORACIC ECHOCARDIOGRAM  11/04/2019   EF 60-65%.  Moderate LVH.  GRII DD.  Mild LV dilation.  Mild LA dilation.  Thickened-calcified aortic valve-Mild AS (  mean gradient 13 mmHg, peak 29 mmHg--similar to 2019)   VIDEO ASSISTED THORACOSCOPY Right 06/10/2014   Procedure: VIDEO ASSISTED THORACOSCOPY;  Surgeon: Ivin Poot, MD;  Location[facility];  Service: Thoracic;  Laterality: Right;    Family History  Problem Relation Age of Onset   COPD Father    Hypothyroidism Father    Anemia Father        iron deficiency   High blood pressure Father    Alcoholism Father    Cancer Mother 58       pancreatic   High blood pressure Mother    High Cholesterol Mother    Breast cancer Neg Hx     Social History   Socioeconomic History   Marital status: Widowed    Spouse name: Not on file   Number of children: Not on file   Years of education: Not on file   Highest  education level: Not on file  Occupational History   Occupation: retired  Tobacco Use   Smoking status: Former    Packs/day: 0.25    Years: 15.00    Pack years: 3.75    Types: Cigarettes    Quit date: 02/08/1980    Years since quitting: 41.1   Smokeless tobacco: Former  Scientific laboratory technician Use: Never used  Substance and Sexual Activity   Alcohol use: Yes    Alcohol/week: 0.0 standard drinks    Comment: occassional/social/rare   Drug use: Not Currently   Sexual activity: Not Currently  Other Topics Concern   Not on file  Social History Narrative   Not on file   Social Determinants of Health   Financial Resource Strain: Low Risk    Difficulty of Paying Living Expenses: Not hard at all  Food Insecurity: Food Insecurity Present   Worried About Charity fundraiser in the Last Year: Sometimes true   Arboriculturist in the Last Year: Sometimes true  Transportation Needs: No Transportation Needs   Lack of Transportation (Medical): No   Lack of Transportation (Non-Medical): No  Physical Activity: Inactive   Days of Exercise per Week: 0 days   Minutes of Exercise per Session: 0 min  Stress: No Stress Concern Present   Feeling of Stress : Not at all  Social Connections: Moderately Isolated   Frequency of Communication with Friends and Family: More than three times a week   Frequency of Social Gatherings with Friends and Family: Twice a week   Attends Religious Services: More than 4 times per year   Active Member of [employer] or Organizations: No   Attends Archivist Meetings: Never   Marital Status: Widowed  Human resources officer Violence: Not At Risk   Fear of Current or Ex-Partner: No   Emotionally Abused: No   Physically Abused: No   Sexually Abused: No    Outpatient Medications Prior to Visit  Medication Sig Dispense Refill   acetaminophen (TYLENOL) 500 MG tablet Take 1 tablet (500 mg total) by mouth every 6 (six) hours as needed. 30 tablet 0   albuterol (PROAIR HFA)  108 (90 Base) MCG/ACT inhaler Inhale 2 puffs into the lungs every 6 (six) hours as needed for wheezing or shortness of breath. 1 each 3   aspirin 81 MG tablet Take 1 tablet (81 mg total) by mouth daily with breakfast. 90 tablet 0   atorvastatin (LIPITOR) 20 MG tablet TAKE 1 TABLET BY MOUTH  DAILY AT 6 PM. 90 tablet 3   carvedilol (  COREG) 25 MG tablet Take 1 tablet (25 mg total) by mouth 2 (two) times daily. 180 tablet 3   fluticasone (FLONASE) 50 MCG/ACT nasal spray Place into both nostrils.     furosemide (LASIX) 40 MG tablet TAKE 1 TAB FOUR TIMES PER WEEK, TAKE AN ADDITIONAL TAB FOR WEIGHT GAIN OF 2 LBS OVERNIGHT OR 5 LBS IN ONE WEEK 180 tablet 3   gabapentin (NEURONTIN) 100 MG capsule Take 1 capsule (100 mg total) by mouth 2 (two) times daily. 180 capsule 1   hydrALAZINE (APRESOLINE) 25 MG tablet Take 1 tablet (25 mg total) by mouth 3 (three) times daily. 270 tablet 3   lisinopril (ZESTRIL) 20 MG tablet TAKE 1 TABLET BY MOUTH ONCE DAILY WITH BREAKFAST 90 tablet 3   montelukast (SINGULAIR) 10 MG tablet Take 1 tablet (10 mg total) by mouth at bedtime. 90 tablet 3   sitaGLIPtin (JANUVIA) 50 MG tablet Take 1 tablet (50 mg total) by mouth daily. 90 tablet 1   vitamin C (ASCORBIC ACID) 500 MG tablet Take 500 mg by mouth daily.      Clobetasol Prop Emollient Base (CLOBETASOL PROPIONATE E) 0.05 % emollient cream Apply 1 application topically daily as needed (skin irritations). 30 g 2   mometasone (NASONEX) 50 MCG/ACT nasal spray Place 2 sprays into the nose daily. 17 g 11   No facility-administered medications prior to visit.    No Known Allergies  Review of Systems  Constitutional:  Negative for chills and fever.  Eyes:  Negative for visual disturbance.  Respiratory:  Negative for cough and shortness of breath.   Cardiovascular:  Negative for chest pain and leg swelling.  Gastrointestinal:  Negative for diarrhea, nausea and vomiting.  Neurological:  Negative for dizziness, light-headedness and  headaches.      Objective:    Physical Exam Vitals and nursing note reviewed.  Constitutional:      Appearance: She is obese.  Eyes:     Extraocular Movements: Extraocular movements intact.     Pupils: Pupils are equal, round, and reactive to light.     Comments: Wears corrective lenses  Cardiovascular:     Rate and Rhythm: Normal rate and regular rhythm.     Heart sounds: Murmur heard.  Pulmonary:     Effort: Pulmonary effort is normal.     Breath sounds: Normal breath sounds.  Abdominal:     General: Bowel sounds are normal.  Skin:    General: Skin is warm.  Neurological:     General: No focal deficit present.     Mental Status: She is alert.    BP 138/76    Pulse (!) 56    Temp 98.4 F (36.9 C)    Resp 10    Ht _0  (1.676 m)    Wt (!) 350 lb 2 oz (158.8 kg)    SpO2 99%    BMI 56.51 kg/m  Wt Readings from Last 3 Encounters:  03/11/21 (!) 350 lb 2 oz (158.8 kg)  03/04/21 (!) 344 lb (156 kg)  02/10/21 (!) 346 lb (156.9 kg)    Health Maintenance Due  Topic Date Due   Zoster Vaccines- Shingrix (1 of 2) Never done   COVID-19 Vaccine (4 - Booster for Pfizer series) 05/16/2020    There are no preventive care reminders to display for this patient.   Lab Results  Component Value Date   TSH 0.51 11/28/2018   Lab Results  Component Value Date   WBC 4.1 01/13/2021  HGB 11.7 (L) 01/13/2021   HCT 38.8 01/13/2021   MCV 89.4 01/13/2021   PLT 200 01/13/2021   Lab Results  Component Value Date   NA 138 02/16/2021   K 4.4 02/16/2021   CO2 31 02/16/2021   GLUCOSE 98 02/16/2021   BUN 27 (H) 02/16/2021   CREATININE 1.90 (H) 02/16/2021   BILITOT 0.4 01/13/2021   ALKPHOS 70 01/13/2021   AST 11 (L) 01/13/2021   ALT 9 01/13/2021   PROT 8.0 01/13/2021   ALBUMIN 4.0 02/16/2021   CALCIUM 9.5 02/16/2021   ANIONGAP 9 01/13/2021   EGFR 33 (L) 10/14/2020   GFR 26.66 (L) 02/16/2021   Lab Results  Component Value Date   CHOL 116 05/14/2020   Lab Results  Component  Value Date   HDL 51.60 05/14/2020   Lab Results  Component Value Date   LDLCALC 52 05/14/2020   Lab Results  Component Value Date   TRIG 61.0 05/14/2020   Lab Results  Component Value Date   CHOLHDL 2 05/14/2020   Lab Results  Component Value Date   HGBA1C 6.1 (H) 10/14/2020       Assessment & Plan:   Problem List Items Addressed This Visit       Cardiovascular and Mediastinum   Essential hypertension - Primary (Chronic)    Patient currently maintained on carvedilol, hydralazine, and lisinopril.  Patient went for dental consultation today and was very hypertensive readings of 200s over 90s.  Patient was referred to either ED or PCP and she chose to come to PCP.  Blood pressure within normal limits with in office.  Patient did take antihypertensives in between dental appointment and appointment with me.  Did recommend that patient take antihypertensives morning procedure as long as the procedure does not require her to be n.p.o.  Patient did have a form that needs to be filled out and faxed filled out form placed on MAs desk to fax.  She does have a murmur but has been evaluated by cardiology in the past.  Patient was cleared to have dental procedure done states that they did reschedule her for later in February        No orders of the defined types were placed in this encounter.  This visit occurred during the SARS-CoV-2 public health emergency.  Safety protocols were in place, including screening questions prior to the visit, additional usage of staff PPE, and extensive cleaning of exam room while observing appropriate contact time as indicated for disinfecting solutions.   Romilda Garret, NP

## 2021-03-11 NOTE — Patient Instructions (Addendum)
Nice to see you today Blood pressure was ok while in office. If you do not have to be "nothing by mouth" before your dental work I would recommend that you take your blood pressure medicine as scheduled. If you can check blood pressure at home 3 times a week at least to keep a check on it. Follow up if symptoms do not improve or worsen.

## 2021-03-11 NOTE — Assessment & Plan Note (Signed)
Patient currently maintained on carvedilol, hydralazine, and lisinopril.  Patient went for dental consultation today and was very hypertensive readings of 200s over 90s.  Patient was referred to either ED or PCP and she chose to come to PCP.  Blood pressure within normal limits with in office.  Patient did take antihypertensives in between dental appointment and appointment with me.  Did recommend that patient take antihypertensives morning procedure as long as the procedure does not require her to be n.p.o.  Patient did have a form that needs to be filled out and faxed filled out form placed on MAs desk to fax.  She does have a murmur but has been evaluated by cardiology in the past.  Patient was cleared to have dental procedure done states that they did reschedule her for later in February

## 2021-03-19 ENCOUNTER — Telehealth: Payer: Self-pay

## 2021-03-19 NOTE — Progress Notes (Signed)
Chronic Care Management Pharmacy Assistant   Name: Jalayne Ganesh  MRN: [id number] DOB: [birth year]  Reason for Encounter: Medication Review/Medication Coordination Call.   Recent office visits:  01/11/2021 Wilfred Lacy NP (PCP) Decrease Januvia to 50 mg daily 12/22/2020 Wilfred Lacy NP (PCP) No Medication Changes noted 11/25/2020 Quinn Plowman RN (CCM) No Medication Changes noted 10/21/2020 Quinn Plowman RN (CCM) No Medication Changes noted 09/24/2020 Wilfred Lacy NP (PCP) No Medication Changes noted  Recent consult visits:  03/11/2021 Karl Ito NP (Pain Medicine) No medication Changes noted, 03/04/2021 Jearld Lesch DO (Bariatrics)No medication Changes noted, Follow up in 4 weeks 02/25/2021 Landis Martins DPM (Podiatry) No Medication Changes noted 02/10/2021 Jearld Lesch DO (Bariatrics)No medication Changes noted,  Follow up in 4 weeks 01/13/2021 Wilber Bihari NP (Oncology) No medication Changes noted 12/29/2020 Jearld Lesch DO (Bariatrics)No medication Changes noted, Follow up in 4 weeks 12/09/2020 Jearld Lesch DO (Bariatrics)No medication Changes noted, Follow up in 4 weeks 11/26/2020 Landis Martins DPM (Podiatry) No Medication Changes noted, follow up in 3 months 11/16/2020 Dr. Chryl Heck MD (Oncology) consider oral iron trial for about 8 weeks 11/11/2020 Jearld Lesch DO [facility]) No medication Changes noted, Ambulatory referral to Hematology / Oncology, follow up in 4 weeks 10/14/2020 Jearld Lesch DO [facility]) No medication Changes noted, Follow up 3 weeks 09/22/2020 Dr. Halford Chessman MD (Pulmonology)  No Medication Changes noted, Follow up 6 months  Hospital visits:  None in previous 6 months  Medications: Outpatient Encounter Medications as of 03/19/2021  Medication Sig   acetaminophen (TYLENOL) 500 MG tablet Take 1 tablet (500 mg total) by mouth every 6 (six) hours as needed.   albuterol (PROAIR HFA) 108 (90 Base) MCG/ACT inhaler Inhale 2 puffs into the lungs every 6 (six)  hours as needed for wheezing or shortness of breath.   aspirin 81 MG tablet Take 1 tablet (81 mg total) by mouth daily with breakfast.   atorvastatin (LIPITOR) 20 MG tablet TAKE 1 TABLET BY MOUTH  DAILY AT 6 PM.   carvedilol (COREG) 25 MG tablet Take 1 tablet (25 mg total) by mouth 2 (two) times daily.   fluticasone (FLONASE) 50 MCG/ACT nasal spray Place into both nostrils.   furosemide (LASIX) 40 MG tablet TAKE 1 TAB FOUR TIMES PER WEEK, TAKE AN ADDITIONAL TAB FOR WEIGHT GAIN OF 2 LBS OVERNIGHT OR 5 LBS IN ONE WEEK   gabapentin (NEURONTIN) 100 MG capsule Take 1 capsule (100 mg total) by mouth 2 (two) times daily.   hydrALAZINE (APRESOLINE) 25 MG tablet Take 1 tablet (25 mg total) by mouth 3 (three) times daily.   lisinopril (ZESTRIL) 20 MG tablet TAKE 1 TABLET BY MOUTH ONCE DAILY WITH BREAKFAST   montelukast (SINGULAIR) 10 MG tablet Take 1 tablet (10 mg total) by mouth at bedtime.   sitaGLIPtin (JANUVIA) 50 MG tablet Take 1 tablet (50 mg total) by mouth daily.   No facility-administered encounter medications on file as of 03/19/2021.    Care Gaps: Shingrix Vaccine COVID-19 Vaccine  Star Rating Drugs: Atorvastatin 20 mg last filled 03/08/2021 23 day supply at [facility]. Lisinopril 20 mg last filled 02/16/2021 45 day supply at [facility]. Medication Fill Gaps: None ID  Reviewed chart for medication changes ahead of medication coordination call.  BP Readings from Last 3 Encounters:  03/11/21 138/76  03/04/21 (!) 146/84  02/10/21 (!) 145/86    Lab Results  Component Value Date   HGBA1C 6.1 (H) 10/14/2020     Patient obtains medications through Vials  90 Days  Last adherence delivery included:   Lisinopril 20 mg one tablet daily Montelukast 10 mg one tablet daily  Atorvastatin 20 mg one tablet daily- Carvedilol 25 mg one tablet twice daily Gabapentin 100 mg one capsule 2 times daily  Patient declined medications  last month Furosemide 40 mg one tablet four times  per week (Adequate supply)  Hydralazine 25 mg one tablet 3 times daily  (Adequate supply)  Januvia 50 mg one tablet daily (Patient receives  assistance)  Flonase 50 MCG/ACT nasal spray   (Adequate supply)   Albuterol 108 MCG/ACT inhaler- PRN Aspirin 81 mg one tablet daily - OTC  Patient is due for next adherence delivery on: 03/31/2021. Called patient and reviewed medications and coordinated delivery.  This delivery to include:  Lisinopril 20 mg one tablet daily Montelukast 10 mg one tablet daily  Atorvastatin 20 mg one tablet daily- Carvedilol 25 mg one tablet twice daily Gabapentin 100 mg one capsule 2 times daily Flonase 50 MCG/ACT nasal spray   Albuterol 108 MCG/ACT inhaler- PRN   Patient declined the following medications: Furosemide 40 mg one tablet four times per week (Adequate supply)  Hydralazine 25 mg one tablet 3 times daily  (Adequate supply- patient states she still has plenty on hand and will return my call when she is low)  Januvia 50 mg one tablet daily (Patient receives  assistance)  Aspirin 81 mg one tablet daily - OTC  Patient needs refills for Flonase 50 MCG/ACT nasal spray, Albuterol.  Confirmed delivery date of 03/31/2021, advised patient that pharmacy will contact them the morning of delivery.  Telephone follow up appointment with Care management team member scheduled for : 04/29/2021 at 3:45 pm.   [place] Pharmacist Assistant [phone]

## 2021-03-23 MED ORDER — ALBUTEROL SULFATE HFA 108 (90 BASE) MCG/ACT IN AERS: 2.0000 | INHALATION_SPRAY | Freq: Four times a day (QID) | RESPIRATORY_TRACT | 3 refills | Status: DC | PRN

## 2021-03-23 MED ORDER — FLUTICASONE PROPIONATE 50 MCG/ACT NA SUSP: 1.0000 | Freq: Every day | NASAL | 3 refills | Status: DC

## 2021-03-23 NOTE — Addendum Note (Signed)
Addended by: Daron Offer A on: 03/23/2021 10:39 AM   Modules accepted: Orders

## 2021-03-24 ENCOUNTER — Ambulatory Visit (INDEPENDENT_AMBULATORY_CARE_PROVIDER_SITE_OTHER): Payer: No Typology Code available for payment source

## 2021-03-24 DIAGNOSIS — I10 Essential (primary) hypertension: Secondary | ICD-10-CM

## 2021-03-24 DIAGNOSIS — I5032 Chronic diastolic (congestive) heart failure: Secondary | ICD-10-CM

## 2021-03-24 DIAGNOSIS — I110 Hypertensive heart disease with heart failure: Secondary | ICD-10-CM

## 2021-03-24 DIAGNOSIS — N184 Chronic kidney disease, stage 4 (severe): Secondary | ICD-10-CM

## 2021-03-25 NOTE — Chronic Care Management (AMB) (Signed)
Chronic Care Management   CCM RN Visit Note  03/25/2021 Late entry for 03/24/21 Name: Stacey Page MRN: [id number] DOB: 1950/08/28  Subjective: Stacey Page is a 71 y.o. year old female who is a primary care patient of [name], [name], NP. The care management team was consulted for assistance with disease management and care coordination needs.    Engaged with patient by telephone for follow up visit in response to provider referral for case management and/or care coordination services.   Consent to Services:  The patient was given information about Chronic Care Management services, agreed to services, and gave verbal consent prior to initiation of services.  Please see initial visit note for detailed documentation.   Patient agreed to services and verbal consent obtained.   Assessment: Review of patient past medical history, allergies, medications, health status, including review of consultants reports, laboratory and other test data, was performed as part of comprehensive evaluation and provision of chronic care management services.   SDOH (Social Determinants of Health) assessments and interventions performed:    CCM Care Plan  No Known Allergies  Outpatient Encounter Medications as of 03/24/2021  Medication Sig   acetaminophen (TYLENOL) 500 MG tablet Take 1 tablet (500 mg total) by mouth every 6 (six) hours as needed.   albuterol (PROAIR HFA) 108 (90 Base) MCG/ACT inhaler Inhale 2 puffs into the lungs every 6 (six) hours as needed for wheezing or shortness of breath.   aspirin 81 MG tablet Take 1 tablet (81 mg total) by mouth daily with breakfast.   atorvastatin (LIPITOR) 20 MG tablet TAKE 1 TABLET BY MOUTH  DAILY AT 6 PM.   carvedilol (COREG) 25 MG tablet Take 1 tablet (25 mg total) by mouth 2 (two) times daily.   Docusate Sodium 100 MG capsule Take 100 mg by mouth 2 (two) times daily.   ferrous sulfate 325 (65 FE) MG tablet Take 325 mg by mouth 2 (two) times daily  with a meal.   fluticasone (FLONASE) 50 MCG/ACT nasal spray Place 1 spray into both nostrils daily.   furosemide (LASIX) 40 MG tablet TAKE 1 TAB FOUR TIMES PER WEEK, TAKE AN ADDITIONAL TAB FOR WEIGHT GAIN OF 2 LBS OVERNIGHT OR 5 LBS IN ONE WEEK   gabapentin (NEURONTIN) 100 MG capsule Take 1 capsule (100 mg total) by mouth 2 (two) times daily.   hydrALAZINE (APRESOLINE) 25 MG tablet Take 1 tablet (25 mg total) by mouth 3 (three) times daily.   lisinopril (ZESTRIL) 20 MG tablet TAKE 1 TABLET BY MOUTH ONCE DAILY WITH BREAKFAST   montelukast (SINGULAIR) 10 MG tablet Take 1 tablet (10 mg total) by mouth at bedtime.   sitaGLIPtin (JANUVIA) 50 MG tablet Take 1 tablet (50 mg total) by mouth daily.   No facility-administered encounter medications on file as of 03/24/2021.    Patient Active Problem List   Diagnosis Date Noted   Iron deficiency anemia 01/13/2021   CKD (chronic kidney disease) stage 4, GFR 15-29 ml/min ([place]) 07/09/2019   Diabetes mellitus type 2 in obese ([place]) 07/02/2019   Polyneuropathy associated with underlying disease ([place]) 11/28/2018   Severe episode of recurrent major depressive disorder, without psychotic features ([place]) 11/28/2018   Type 2 diabetes mellitus with diabetic peripheral angiopathy without gangrene, without long-term current use of insulin ([place]) 11/28/2018   Poor short term memory 11/28/2018   Mild aortic stenosis by prior echocardiogram 11/06/2018   Symptomatic anemia 03/17/2016   S/P total knee arthroplasty, right 03/17/2016   Unilateral primary  osteoarthritis, right knee    Hyperlipidemia associated with type 2 diabetes mellitus ([place]) 12/15/2015   Pain in both lower extremities 11/19/2015   Numbness in both hands 08/13/2015   Panic attack 06/09/2015   Right knee pain 05/14/2015   Chronic renal insufficiency 09/16/2014   Pneumothorax on right    Atypical mycobacterium infection    ILD (interstitial lung disease) 2/2 MAI s/p med rxn    Morbid obesity ([place])  04/03/2014   OSA (obstructive sleep apnea) 02/25/2014   Hypertensive heart disease with chronic diastolic congestive heart failure ([place]) 02/25/2014   DOE (dyspnea on exertion) 02/25/2014   Essential hypertension 02/25/2014    Conditions to be addressed/monitored:CHF, HTN, and CKD Stage IV   Care Plan : [facility] plan of care  Updates made by Dannielle Karvonen, RN since 03/25/2021 12:00 AM   Problem: Chronic disease management education and care coordination needs.   Priority: High   Long-Range Goal: Development of plan of chronic care to address chronic disease management and care coodination needs.   Start Date: 03/24/2021  Expected End Date: 06/04/2021  Priority: High  Current Barriers:  Knowledge Deficits related to plan of care for management of CHF, HTN, and CKD Stage IV  Patient states she is doing well. Reports last follow up visit with primary care provider was 11/15/222, saw podiatrist last 02/25/21 and hematologist/ oncology last visit 01/13/22.  Patient reports she does not have scales to weigh.  RNCM discussed importance of weighing daily for heart failure management.  Patient verbalized understanding.  Patient reports blood pressure readings: 142/56, 118/62, 114/65. Patient reports she is still attending the weight management program.  Patient reports next follow up visit with nephrologist 03/29/21 RNCM Clinical Goal(s):  Patient will verbalize understanding of plan for management of CHF, HTN, and CKD Stage IV as evidenced by patient self report and/ or notation in chart take all medications exactly as prescribed and will call provider for medication related questions as evidenced by patient self report and/  or notation in chart    attend all scheduled medical appointments:   as evidenced by patient self report and / or notation in chart        continue to work with RN Care Manager and/or Social Worker to address care management and care coordination needs related to CHF, HTN, and CKD  Stage IV as evidenced by adherence to CM Team Scheduled appointments     through collaboration with Consulting civil engineer, provider, and care team.   Interventions: 1:1 collaboration with primary care provider regarding development and update of comprehensive plan of care as evidenced by provider attestation and co-signature Inter-disciplinary care team collaboration (see longitudinal plan of care) Evaluation of current treatment plan related to  self management and patient's adherence to plan as established by provider   Heart Failure Interventions:  (Status:  Goal on track:  Yes.) Long Term Goal Discussed importance of following low sodium diet Reviewed Heart Failure Action Plan and zones Assessed need for readable accurate scales in home Discussed importance of daily weight and advised patient to weigh and record daily Discussed the importance of keeping all appointments with provider Reviewed medications and discussed compliance.    Chronic Kidney Disease Interventions:  (Status:  Goal on track:  Yes.) Long Term Goal Evaluation of current treatment plan related to chronic kidney disease self management and patient's adherence to plan as established by provider      Reviewed medications with patient and discussed importance of compliance  Reviewed scheduled/upcoming provider appointments including    Discussed plans with patient for ongoing care management follow up and provided patient with direct contact information for care management team    Sent patient education information on CKD. Last practice recorded BP readings:  BP Readings from Last 3 Encounters:  03/11/21 138/76  03/04/21 (!) 146/84  02/10/21 (!) 145/86  Most recent eGFR/CrCl:  Lab Results  Component Value Date   EGFR 33 (L) 10/14/2020    No components found for: CRCL    Hypertension Interventions:  (Status:  Goal on track:  Yes.) Long Term Goal Last practice recorded BP readings:  BP Readings from Last 3 Encounters:   03/11/21 138/76  03/04/21 (!) 146/84  02/10/21 (!) 145/86  Most recent eGFR/CrCl:  Lab Results  Component Value Date   EGFR 33 (L) 10/14/2020    No components found for: CRCL  Evaluation of current treatment plan related to hypertension self management and patient's adherence to plan as established by provider Reviewed medications with patient and discussed importance of compliance Discussed plans with patient for ongoing care management follow up and provided patient with direct contact information for care management team Reviewed scheduled/upcoming provider appointments including:   Advised to follow low sodium diet.   Patient Goals/Self-Care Activities: Continue to take medications as prescribed   Attend all scheduled provider appointments Call pharmacy for medication refills 3-7 days in advance of running out of medications Call provider office for new concerns or questions  Consider getting  scales to weigh daily Continue to monitor blood pressures and record readings.        Plan:The patient has been provided with contact information for the care management team and has been advised to call with any health related questions or concerns.  The care management team will reach out to the patient again over the next 45 days. Quinn Plowman RN,BSN,CCM RN Case Manager [place] [phone]

## 2021-03-25 NOTE — Patient Instructions (Signed)
Visit Information  Thank you for taking time to visit with me today. Please don't hesitate to contact me if I can be of assistance to you before our next scheduled telephone appointment.  Following are the goals we discussed today:  Continue to take medications as prescribed   Attend all scheduled provider appointments Call pharmacy for medication refills 3-7 days in advance of running out of medications Call provider office for new concerns or questions  Consider getting  scales to weigh daily Continue to monitor blood pressures and record readings.   Our next appointment is by telephone on 04/26/21 at 11:00 am  Please call the care guide team at [phone] if you need to cancel or reschedule your appointment.   If you are experiencing a Mental Health or [place] or need someone to talk to, please call the Suicide and Crisis Lifeline: 988 call 1-800-273-TALK (toll free, 24 hour hotline)   Patient verbalizes understanding of instructions and care plan provided today and agrees to view in [place]. Active MyChart status confirmed with patient.    Quinn Plowman RN,BSN,CCM RN Case Manager [place] [phone]

## 2021-03-29 DIAGNOSIS — D631 Anemia in chronic kidney disease: Secondary | ICD-10-CM | POA: Diagnosis not present

## 2021-03-29 DIAGNOSIS — I5032 Chronic diastolic (congestive) heart failure: Secondary | ICD-10-CM | POA: Diagnosis not present

## 2021-03-29 DIAGNOSIS — I129 Hypertensive chronic kidney disease with stage 1 through stage 4 chronic kidney disease, or unspecified chronic kidney disease: Secondary | ICD-10-CM | POA: Diagnosis not present

## 2021-03-29 DIAGNOSIS — N184 Chronic kidney disease, stage 4 (severe): Secondary | ICD-10-CM | POA: Diagnosis not present

## 2021-03-29 DIAGNOSIS — E1122 Type 2 diabetes mellitus with diabetic chronic kidney disease: Secondary | ICD-10-CM | POA: Diagnosis not present

## 2021-03-29 DIAGNOSIS — N2581 Secondary hyperparathyroidism of renal origin: Secondary | ICD-10-CM | POA: Diagnosis not present

## 2021-04-01 ENCOUNTER — Other Ambulatory Visit: Payer: Self-pay

## 2021-04-01 ENCOUNTER — Encounter (INDEPENDENT_AMBULATORY_CARE_PROVIDER_SITE_OTHER): Payer: Self-pay | Admitting: Adult Health

## 2021-04-01 ENCOUNTER — Ambulatory Visit (INDEPENDENT_AMBULATORY_CARE_PROVIDER_SITE_OTHER): Payer: No Typology Code available for payment source | Admitting: Adult Health

## 2021-04-01 ENCOUNTER — Telehealth: Payer: Self-pay | Admitting: Nurse Practitioner

## 2021-04-01 VITALS — BP 116/61 | HR 66 | Temp 97.4°F | Ht 66.0 in | Wt 346.0 lb

## 2021-04-01 DIAGNOSIS — Z6841 Body Mass Index (BMI) 40.0 and over, adult: Secondary | ICD-10-CM | POA: Diagnosis not present

## 2021-04-01 DIAGNOSIS — I10 Essential (primary) hypertension: Secondary | ICD-10-CM

## 2021-04-01 DIAGNOSIS — N184 Chronic kidney disease, stage 4 (severe): Secondary | ICD-10-CM | POA: Diagnosis not present

## 2021-04-01 DIAGNOSIS — Z7984 Long term (current) use of oral hypoglycemic drugs: Secondary | ICD-10-CM

## 2021-04-01 DIAGNOSIS — E669 Obesity, unspecified: Secondary | ICD-10-CM | POA: Diagnosis not present

## 2021-04-01 DIAGNOSIS — E1169 Type 2 diabetes mellitus with other specified complication: Secondary | ICD-10-CM | POA: Diagnosis not present

## 2021-04-01 NOTE — Progress Notes (Signed)
Chief Complaint:   OBESITY Stacey Page is here to discuss her progress with her obesity treatment plan along with follow-up of her obesity related diagnoses. Stacey Page is on the Category 2 Plan and states she is following her eating plan approximately 15% of the time. Stacey Page states she is walking 15 minutes 5 times per week.  Today's visit was #: 60 Starting weight: 344 lbs Starting date: 03/04/2021 Today's weight: 346 lbs Today's date: 04/01/2021 Total lbs lost to date: 0 Total lbs lost since last in-office visit: 0  Interim History:  Per patient, Ozempic cost prohibitive.  She can now accurately identify her hunger levels and stop eating when she is full. She typically consumes a meal Q3-4 hrs. Snacks-are either fruit or peanuts.  Subjective:   1. Essential hypertension BP/HR at goal On 02/16/2021 CMP - electrolytes stable.  Creat 1.90, GFR 26.66- hx of CKD.  2. CKD (chronic kidney disease) stage 4, GFR 15-29 ml/min (HCC) Followed by Nephrology.  On 02/16/2021 CMP Creat 1.90, GFR 26.66 She denies family history of renal disease.  3. Type 2 diabetes mellitus with other specified complication, without long-term current use of insulin ([place]) She has been on Januvia since 2010. On 10/14/2020 A1C 6.1. Stacey Page is currently taking Januvia 50mg - managed by PCP.  Nephrology recommends lowing dose to 25mg .  Assessment/Plan:   1. Essential hypertension Increase regular exercise.  2. CKD (chronic kidney disease) stage 4, GFR 15-29 ml/min (HCC) Avoid NSAID's Continue to Nephrology as directed.  3. Type 2 diabetes mellitus with other specified complication, without long-term current use of insulin ([place]) Patient is to follow up with PCP about reducing Januvia dose.  4. Obesity, current BMI 55.9  Stacey Page is currently in the action stage of change. As such, her goal is to continue with weight loss efforts. She has agreed to the Category 2 Plan.   Exercise goals:  As  is.  Behavioral modification strategies: increasing lean protein intake, decreasing simple carbohydrates, meal planning and cooking strategies, keeping healthy foods in the home, and planning for success.  Stacey Page has agreed to follow-up with our clinic in 3 weeks. She was informed of the importance of frequent follow-up visits to maximize her success with intensive lifestyle modifications for her multiple health conditions.   Objective:   Blood pressure 116/61, pulse 66, temperature (!) 97.4 F (36.3 C), height 5\' 6"  (1.676 m), weight (!) 346 lb (156.9 kg), SpO2 98 %. Body mass index is 55.85 kg/m.  General: Cooperative, alert, well developed, in no acute distress. HEENT: Conjunctivae and lids unremarkable. Cardiovascular: Regular rhythm.  Lungs: Normal work of breathing. Neurologic: No focal deficits.   Lab Results  Component Value Date   CREATININE 1.90 (H) 02/16/2021   BUN 27 (H) 02/16/2021   NA 138 02/16/2021   K 4.4 02/16/2021   CL 101 02/16/2021   CO2 31 02/16/2021   Lab Results  Component Value Date   ALT 9 01/13/2021   AST 11 (L) 01/13/2021   ALKPHOS 70 01/13/2021   BILITOT 0.4 01/13/2021   Lab Results  Component Value Date   HGBA1C 6.1 (H) 10/14/2020   HGBA1C 6.1 05/14/2020   HGBA1C 6.0 (H) 10/28/2019   HGBA1C 5.9 (H) 07/04/2019   HGBA1C 6.0 (H) 03/25/2019   Lab Results  Component Value Date   INSULIN 8.6 10/28/2019   INSULIN 9.2 07/04/2019   INSULIN 8.8 03/25/2019   Lab Results  Component Value Date   TSH 0.51 11/28/2018   Lab Results  Component Value Date   CHOL 116 05/14/2020   HDL 51.60 05/14/2020   LDLCALC 52 05/14/2020   TRIG 61.0 05/14/2020   CHOLHDL 2 05/14/2020   Lab Results  Component Value Date   VD25OH 57.6 10/28/2019   VD25OH 41.5 07/04/2019   VD25OH 38.2 03/25/2019   Lab Results  Component Value Date   WBC 4.1 01/13/2021   HGB 11.7 (L) 01/13/2021   HCT 38.8 01/13/2021   MCV 89.4 01/13/2021   PLT 200 01/13/2021   Lab  Results  Component Value Date   IRON 51 01/13/2021   TIBC 295 01/13/2021   FERRITIN 37 01/13/2021    Attestation Statements:   Reviewed by clinician on day of visit: allergies, medications, problem list, medical history, surgical history, family history, social history, and previous encounter notes.  Time spent on visit including pre-visit chart review and post-visit care and charting was 30 minutes.   I, Brendell Tyus, RMA, am acting as transcriptionist for Mina Marble, NP.   I have reviewed the above documentation for accuracy and completeness, and I agree with the above. -  Yee Gangi d. Fantasha Daniele, NP-C

## 2021-04-01 NOTE — Telephone Encounter (Signed)
Pt called to ask about junvia, she said she saw her kidney doctor and they were wondering pt should be brought down to 25mg  from 50mg . Please advise. Call back for pt is [phone]

## 2021-04-06 DIAGNOSIS — I110 Hypertensive heart disease with heart failure: Secondary | ICD-10-CM | POA: Diagnosis not present

## 2021-04-06 DIAGNOSIS — I5032 Chronic diastolic (congestive) heart failure: Secondary | ICD-10-CM | POA: Diagnosis not present

## 2021-04-06 DIAGNOSIS — I10 Essential (primary) hypertension: Secondary | ICD-10-CM

## 2021-04-06 DIAGNOSIS — N184 Chronic kidney disease, stage 4 (severe): Secondary | ICD-10-CM | POA: Diagnosis not present

## 2021-04-07 ENCOUNTER — Ambulatory Visit (INDEPENDENT_AMBULATORY_CARE_PROVIDER_SITE_OTHER): Payer: No Typology Code available for payment source

## 2021-04-07 DIAGNOSIS — I10 Essential (primary) hypertension: Secondary | ICD-10-CM

## 2021-04-07 DIAGNOSIS — I5032 Chronic diastolic (congestive) heart failure: Secondary | ICD-10-CM

## 2021-04-07 DIAGNOSIS — I110 Hypertensive heart disease with heart failure: Secondary | ICD-10-CM

## 2021-04-07 DIAGNOSIS — N184 Chronic kidney disease, stage 4 (severe): Secondary | ICD-10-CM

## 2021-04-07 NOTE — Patient Instructions (Signed)
Visit Information ? ?Thank you for taking time to visit with me today. Please don't hesitate to contact me if I can be of assistance to you before our next scheduled telephone appointment. ? ?Following are the goals we discussed today:  ?Continue to take medications as prescribed   ?Attend all scheduled provider appointments ?Call pharmacy for medication refills 3-7 days in advance of running out of medications ?Call provider office for new concerns or questions  ?Consider getting  scales to weigh daily ?Continue to monitor blood pressures and record readings.  ?Take copy of nephrology ( kidney doctor ) recommendations to primary care provider office.  ? ?Our next appointment is by telephone on 04/26/21 at 11:00 am ? ?Please call the care guide team at [phone] if you need to cancel or reschedule your appointment.  ? ?If you are experiencing a Mental Health or [place] or need someone to talk to, please call the Suicide and Crisis Lifeline: 988 ?call 1-800-273-TALK (toll free, 24 hour hotline)  ? ?Patient verbalizes understanding of instructions and care plan provided today and agrees to view in [place]. Active MyChart status confirmed with patient.   ? ?Quinn Plowman RN,BSN,CCM ?RN Case Manager ?[place]  ?[phone] ? ?

## 2021-04-07 NOTE — Chronic Care Management (AMB) (Signed)
Chronic Care Management   CCM RN Visit Note  04/07/2021 Name: Stacey Page MRN: [id number] DOB: 10-Sep-1950  Subjective: Stacey Page is a 71 y.o. year old female who is a primary care patient of [name], [name], NP. The care management team was consulted for assistance with disease management and care coordination needs.    Engaged with patient by telephone for follow up visit in response to provider referral for case management and/or care coordination services.   Consent to Services:  The patient was given information about Chronic Care Management services, agreed to services, and gave verbal consent prior to initiation of services.  Please see initial visit note for detailed documentation.   Patient agreed to services and verbal consent obtained.   Assessment: Review of patient past medical history, allergies, medications, health status, including review of consultants reports, laboratory and other test data, was performed as part of comprehensive evaluation and provision of chronic care management services.   SDOH (Social Determinants of Health) assessments and interventions performed:    CCM Care Plan  No Known Allergies  Outpatient Encounter Medications as of 04/07/2021  Medication Sig   acetaminophen (TYLENOL) 500 MG tablet Take 1 tablet (500 mg total) by mouth every 6 (six) hours as needed.   albuterol (PROAIR HFA) 108 (90 Base) MCG/ACT inhaler Inhale 2 puffs into the lungs every 6 (six) hours as needed for wheezing or shortness of breath.   aspirin 81 MG tablet Take 1 tablet (81 mg total) by mouth daily with breakfast.   atorvastatin (LIPITOR) 20 MG tablet TAKE 1 TABLET BY MOUTH  DAILY AT 6 PM.   carvedilol (COREG) 25 MG tablet Take 1 tablet (25 mg total) by mouth 2 (two) times daily.   Docusate Sodium 100 MG capsule Take 100 mg by mouth 2 (two) times daily.   ferrous sulfate 325 (65 FE) MG tablet Take 325 mg by mouth 2 (two) times daily with a meal.   fluticasone  (FLONASE) 50 MCG/ACT nasal spray Place 1 spray into both nostrils daily.   furosemide (LASIX) 40 MG tablet TAKE 1 TAB FOUR TIMES PER WEEK, TAKE AN ADDITIONAL TAB FOR WEIGHT GAIN OF 2 LBS OVERNIGHT OR 5 LBS IN ONE WEEK   gabapentin (NEURONTIN) 100 MG capsule Take 1 capsule (100 mg total) by mouth 2 (two) times daily.   hydrALAZINE (APRESOLINE) 25 MG tablet Take 1 tablet (25 mg total) by mouth 3 (three) times daily.   lisinopril (ZESTRIL) 20 MG tablet TAKE 1 TABLET BY MOUTH ONCE DAILY WITH BREAKFAST   montelukast (SINGULAIR) 10 MG tablet Take 1 tablet (10 mg total) by mouth at bedtime.   sitaGLIPtin (JANUVIA) 50 MG tablet Take 1 tablet (50 mg total) by mouth daily.   No facility-administered encounter medications on file as of 04/07/2021.    Patient Active Problem List   Diagnosis Date Noted   Iron deficiency anemia 01/13/2021   CKD (chronic kidney disease) stage 4, GFR 15-29 ml/min ([place]) 07/09/2019   Diabetes mellitus type 2 in obese ([place]) 07/02/2019   Polyneuropathy associated with underlying disease ([place]) 11/28/2018   Severe episode of recurrent major depressive disorder, without psychotic features ([place]) 11/28/2018   Type 2 diabetes mellitus with diabetic peripheral angiopathy without gangrene, without long-term current use of insulin ([place]) 11/28/2018   Poor short term memory 11/28/2018   Mild aortic stenosis by prior echocardiogram 11/06/2018   Diabetes mellitus ([place]) 03/21/2017   Symptomatic anemia 03/17/2016   S/P total knee arthroplasty, right 03/17/2016  Unilateral primary osteoarthritis, right knee    Hyperlipidemia associated with type 2 diabetes mellitus ([place]) 12/15/2015   Pain in both lower extremities 11/19/2015   Numbness in both hands 08/13/2015   Panic attack 06/09/2015   Right knee pain 05/14/2015   Chronic renal insufficiency 09/16/2014   Pneumothorax on right    Atypical mycobacterium infection    ILD (interstitial lung disease) 2/2 MAI s/p med rxn    Class 3 severe  obesity with serious comorbidity and body mass index (BMI) of 50.0 to 59.9 in adult ([place]) 04/03/2014   OSA (obstructive sleep apnea) 02/25/2014   Hypertensive heart disease with chronic diastolic congestive heart failure ([place]) 02/25/2014   DOE (dyspnea on exertion) 02/25/2014   Essential hypertension 02/25/2014    Conditions to be addressed/monitored:CKD Stage IV  Care Plan : [facility] plan of care  Updates made by Dannielle Karvonen, RN since 04/07/2021 12:00 AM     Problem: Chronic disease management education and care coordination needs.   Priority: High     Long-Range Goal: Development of plan of chronic care to address chronic disease management and care coodination needs.   Start Date: 03/24/2021  Expected End Date: 06/04/2021  Priority: High  Note:   Current Barriers:  Knowledge Deficits related to plan of care for management of CHF, HTN, and CKD Stage IV  Patient left message for [facility] stating she had a visit with the nephrologist and the doctor wants to decrease her Januvia to 25 mg and increase her vitamin D to 2,000 units.  Patient reports she left a message with primary care provider office on 04/01/21 but did not hear back.  Patient requesting assistance from [facility] to contact primary provider office regarding these concerns. Patient states she has a copy of the nephrologists recommendations and is willing to drop off at primary care office.  RNCM Clinical Goal(s):  Patient will verbalize understanding of plan for management of CHF, HTN, and CKD Stage IV as evidenced by patient self report and/ or notation in chart take all medications exactly as prescribed and will call provider for medication related questions as evidenced by patient self report and/  or notation in chart    attend all scheduled medical appointments:   as evidenced by patient self report and / or notation in chart        continue to work with [place] and/or Social Worker to address care management and care  coordination needs related to CHF, HTN, and CKD Stage IV as evidenced by adherence to CM Team Scheduled appointments     through collaboration with Consulting civil engineer, provider, and care team.   Interventions: 1:1 collaboration with primary care provider regarding development and update of comprehensive plan of care as evidenced by provider attestation and co-signature Inter-disciplinary care team collaboration (see longitudinal plan of care) Evaluation of current treatment plan related to  self management and patient's adherence to plan as established by provider   Heart Failure Interventions:  (Status:  Goal on track:  Yes.) Long Term Goal Discussed importance of following low sodium diet Reviewed Heart Failure Action Plan and zones Assessed need for readable accurate scales in home Discussed importance of daily weight and advised patient to weigh and record daily Discussed the importance of keeping all appointments with provider Reviewed medications and discussed compliance.    Chronic Kidney Disease Interventions:  (Status:  Goal on track:  Yes.) Long Term Goal Evaluation of current treatment plan related to chronic kidney disease self  management and patient's adherence to plan as established by provider      Reviewed medications with patient and discussed importance of compliance    Reviewed scheduled/upcoming provider appointments including    Discussed plans with patient for ongoing care management follow up and provided patient with direct contact information for care management team    Sent patient education information on CKD. RNCM sent message to primary care provider regarding patients concerns/ medication adjustments per nephrology.  RNCM advised patient to drop off copy of nephrology note/ recommendations to primary care provider office.  Last practice recorded BP readings:  BP Readings from Last 3 Encounters:  03/11/21 138/76  03/04/21 (!) 146/84  02/10/21 (!) 145/86  Most  recent eGFR/CrCl:  Lab Results  Component Value Date   EGFR 33 (L) 10/14/2020    No components found for: CRCL    Hypertension Interventions:  (Status:  Goal on track:  Yes.) Long Term Goal Last practice recorded BP readings:  BP Readings from Last 3 Encounters:  03/11/21 138/76  03/04/21 (!) 146/84  02/10/21 (!) 145/86  Most recent eGFR/CrCl:  Lab Results  Component Value Date   EGFR 33 (L) 10/14/2020    No components found for: CRCL  Evaluation of current treatment plan related to hypertension self management and patient's adherence to plan as established by provider Reviewed medications with patient and discussed importance of compliance Discussed plans with patient for ongoing care management follow up and provided patient with direct contact information for care management team Reviewed scheduled/upcoming provider appointments including:   Advised to follow low sodium diet.   Patient Goals/Self-Care Activities: Continue to take medications as prescribed   Attend all scheduled provider appointments Call pharmacy for medication refills 3-7 days in advance of running out of medications Call provider office for new concerns or questions  Consider getting  scales to weigh daily Continue to monitor blood pressures and record readings.  Take copy of nephrology ( kidney doctor ) recommendations to primary care provider office.        Plan:The patient has been provided with contact information for the care management team and has been advised to call with any health related questions or concerns.  The care management team will reach out to the patient again over the next 2 months . Quinn Plowman RN,BSN,CCM RN Case Manager [place]  [phone]

## 2021-04-08 NOTE — Telephone Encounter (Signed)
Patient notified and verbalized understanding. 

## 2021-04-08 NOTE — Telephone Encounter (Signed)
Patient wants to confirm you want to keep the dosage the same at 50 mg? She says her AVS from the other doctor says to discuss decreasing it but she understands if you don't see it in the visit note. Please advise ?

## 2021-04-12 ENCOUNTER — Other Ambulatory Visit: Payer: Self-pay

## 2021-04-12 DIAGNOSIS — D509 Iron deficiency anemia, unspecified: Secondary | ICD-10-CM

## 2021-04-13 ENCOUNTER — Other Ambulatory Visit: Payer: Self-pay

## 2021-04-13 ENCOUNTER — Inpatient Hospital Stay: Payer: No Typology Code available for payment source | Attending: Hematology and Oncology

## 2021-04-13 DIAGNOSIS — D509 Iron deficiency anemia, unspecified: Secondary | ICD-10-CM | POA: Diagnosis not present

## 2021-04-13 LAB — CBC WITH DIFFERENTIAL (CANCER CENTER ONLY)
Abs Immature Granulocytes: 0.01 10*3/uL (ref 0.00–0.07)
Basophils Absolute: 0 10*3/uL (ref 0.0–0.1)
Basophils Relative: 1 %
Eosinophils Absolute: 0.1 10*3/uL (ref 0.0–0.5)
Eosinophils Relative: 3 %
HCT: 38.7 % (ref 36.0–46.0)
Hemoglobin: 11.9 g/dL — ABNORMAL LOW (ref 12.0–15.0)
Immature Granulocytes: 0 %
Lymphocytes Relative: 22 %
Lymphs Abs: 1 10*3/uL (ref 0.7–4.0)
MCH: 28.3 pg (ref 26.0–34.0)
MCHC: 30.7 g/dL (ref 30.0–36.0)
MCV: 91.9 fL (ref 80.0–100.0)
Monocytes Absolute: 0.4 10*3/uL (ref 0.1–1.0)
Monocytes Relative: 8 %
Neutro Abs: 3.1 10*3/uL (ref 1.7–7.7)
Neutrophils Relative %: 66 %
Platelet Count: 197 10*3/uL (ref 150–400)
RBC: 4.21 MIL/uL (ref 3.87–5.11)
RDW: 15.3 % (ref 11.5–15.5)
WBC Count: 4.7 10*3/uL (ref 4.0–10.5)
nRBC: 0 % (ref 0.0–0.2)

## 2021-04-13 LAB — CMP (CANCER CENTER ONLY)
ALT: 8 U/L (ref 0–44)
AST: 10 U/L — ABNORMAL LOW (ref 15–41)
Albumin: 3.8 g/dL (ref 3.5–5.0)
Alkaline Phosphatase: 67 U/L (ref 38–126)
Anion gap: 5 (ref 5–15)
BUN: 21 mg/dL (ref 8–23)
CO2: 31 mmol/L (ref 22–32)
Calcium: 9.5 mg/dL (ref 8.9–10.3)
Chloride: 104 mmol/L (ref 98–111)
Creatinine: 1.76 mg/dL — ABNORMAL HIGH (ref 0.44–1.00)
GFR, Estimated: 31 mL/min — ABNORMAL LOW (ref >60)
Glucose, Bld: 112 mg/dL — ABNORMAL HIGH (ref 70–99)
Potassium: 4.1 mmol/L (ref 3.5–5.1)
Sodium: 140 mmol/L (ref 135–145)
Total Bilirubin: 0.4 mg/dL (ref 0.3–1.2)
Total Protein: 7.6 g/dL (ref 6.5–8.1)

## 2021-04-13 LAB — RETICULOCYTES
Immature Retic Fract: 14.9 % (ref 2.3–15.9)
RBC.: 4.11 MIL/uL (ref 3.87–5.11)
Retic Count, Absolute: 44.8 10*3/uL (ref 19.0–186.0)
Retic Ct Pct: 1.1 % (ref 0.4–3.1)

## 2021-04-13 LAB — IRON AND IRON BINDING CAPACITY (CC-WL,HP ONLY)
Iron: 54 ug/dL (ref 28–170)
Saturation Ratios: 19 % (ref 10.4–31.8)
TIBC: 291 ug/dL (ref 250–450)
UIBC: 237 ug/dL (ref 148–442)

## 2021-04-13 LAB — FERRITIN: Ferritin: 40 ng/mL (ref 11–307)

## 2021-04-14 ENCOUNTER — Telehealth: Payer: Self-pay

## 2021-04-14 ENCOUNTER — Ambulatory Visit (INDEPENDENT_AMBULATORY_CARE_PROVIDER_SITE_OTHER): Payer: No Typology Code available for payment source | Admitting: Nurse Practitioner

## 2021-04-14 ENCOUNTER — Encounter: Payer: Self-pay | Admitting: Nurse Practitioner

## 2021-04-14 VITALS — BP 122/60 | HR 62 | Temp 96.9°F | Ht 66.0 in | Wt 354.2 lb

## 2021-04-14 DIAGNOSIS — E1151 Type 2 diabetes mellitus with diabetic peripheral angiopathy without gangrene: Secondary | ICD-10-CM

## 2021-04-14 DIAGNOSIS — G4733 Obstructive sleep apnea (adult) (pediatric): Secondary | ICD-10-CM | POA: Diagnosis not present

## 2021-04-14 LAB — POCT GLYCOSYLATED HEMOGLOBIN (HGB A1C): Hemoglobin A1C: 6.4 % — AB (ref 4.0–5.6)

## 2021-04-14 MED ORDER — SITAGLIPTIN PHOSPHATE 25 MG PO TABS: 25.0000 mg | ORAL_TABLET | Freq: Every day | ORAL | 3 refills | Status: DC

## 2021-04-14 MED ORDER — SITAGLIPTIN PHOSPHATE 25 MG PO TABS: 25.0000 mg | ORAL_TABLET | Freq: Every day | ORAL | 0 refills | Status: DC

## 2021-04-14 NOTE — Progress Notes (Signed)
? ?Subjective:  ?Patient ID: Stacey Page, female    DOB: October 11, 1950  Age: 71 y.o. MRN: [id number] ? ?CC: Follow-up (Pt would like to discuss possible change in Januvia dosage. ) ? ?HPI ? ?Type 2 diabetes mellitus with diabetic peripheral angiopathy without gangrene, without long-term current use of insulin ([place]) ?Controlled with POC HgbA1c at 6.4% today ?Current use of Januvia '50mg'$  ?Medication dose discrepancy when medication was switched from ozempic back to Januvia. Current dose should be '25mg'$ . ?She wants rx faxed to [employer] [facility] [employer]). ?New rx faxed to [phone]. ? ?Up to date with eye exam and foot exam. ?Normal urine microalbumin ?BP at goal ?She continues with work with [employer] management program. ?LDL at goal with atorvastatin ?F/up in 63month ? ?Reviewed past Medical, Social and Family history today. ? ?Outpatient Medications Prior to Visit  ?Medication Sig Dispense Refill  ? acetaminophen (TYLENOL) 500 MG tablet Take 1 tablet (500 mg total) by mouth every 6 (six) hours as needed. 30 tablet 0  ? albuterol (PROAIR HFA) 108 (90 Base) MCG/ACT inhaler Inhale 2 puffs into the lungs every 6 (six) hours as needed for wheezing or shortness of breath. 1 each 3  ? aspirin 81 MG tablet Take 1 tablet (81 mg total) by mouth daily with breakfast. 90 tablet 0  ? atorvastatin (LIPITOR) 20 MG tablet TAKE 1 TABLET BY MOUTH  DAILY AT 6 PM. 90 tablet 3  ? carvedilol (COREG) 25 MG tablet Take 1 tablet (25 mg total) by mouth 2 (two) times daily. 180 tablet 3  ? Docusate Sodium 100 MG capsule Take 100 mg by mouth 2 (two) times daily.    ? ferrous sulfate 325 (65 FE) MG tablet Take 325 mg by mouth 2 (two) times daily with a meal.    ? fluticasone (FLONASE) 50 MCG/ACT nasal spray Place 1 spray into both nostrils daily. 16 g 3  ? furosemide (LASIX) 40 MG tablet TAKE 1 TAB FOUR TIMES PER WEEK, TAKE AN ADDITIONAL TAB FOR WEIGHT GAIN OF 2 LBS OVERNIGHT OR 5 LBS IN ONE WEEK 180 tablet 3  ?  gabapentin (NEURONTIN) 100 MG capsule Take 1 capsule (100 mg total) by mouth 2 (two) times daily. 180 capsule 1  ? hydrALAZINE (APRESOLINE) 25 MG tablet Take 1 tablet (25 mg total) by mouth 3 (three) times daily. 270 tablet 3  ? lisinopril (ZESTRIL) 20 MG tablet TAKE 1 TABLET BY MOUTH ONCE DAILY WITH BREAKFAST 90 tablet 3  ? montelukast (SINGULAIR) 10 MG tablet Take 1 tablet (10 mg total) by mouth at bedtime. 90 tablet 3  ? sitaGLIPtin (JANUVIA) 50 MG tablet Take 1 tablet (50 mg total) by mouth daily. 90 tablet 1  ? ?No facility-administered medications prior to visit.  ? ? ?ROS ?See HPI ? ?Objective:  ?BP 122/60 (BP Location: Left Arm, Patient Position: Sitting, Cuff Size: Large)   Pulse 62   Temp (!) 96.9 ?F (36.1 ?C) (Temporal)   Ht '5\' 6"'$  (1.676 m)   Wt (!) 354 lb 3.2 oz (160.7 kg)   SpO2 98%   BMI 57.17 kg/m?  ? ?Physical Exam ? ?Assessment & Plan:  ?This visit occurred during the SARS-CoV-2 public health emergency.  Safety protocols were in place, including screening questions prior to the visit, additional usage of staff PPE, and extensive cleaning of exam room while observing appropriate contact time as indicated for disinfecting solutions.  ? ?GLoanywas seen today for follow-up. ? ?Diagnoses and all orders for  this visit: ? ?Type 2 diabetes mellitus with diabetic peripheral angiopathy without gangrene, without long-term current use of insulin (HCC) ?-     POCT glycosylated hemoglobin (Hb A1C) ?-     sitaGLIPtin (JANUVIA) 25 MG tablet; Take 1 tablet (25 mg total) by mouth daily. ? ? ? ?Problem List Items Addressed This Visit   ? ?  ? Cardiovascular and Mediastinum  ? Type 2 diabetes mellitus with diabetic peripheral angiopathy without gangrene, without long-term current use of insulin ([place]) - Primary  ?  Controlled with POC HgbA1c at 6.4% today ?Current use of Januvia '50mg'$  ?Medication dose discrepancy when medication was switched from ozempic back to Januvia. Current dose should be '25mg'$ . ?She wants  rx faxed to [employer] [facility] [employer]). ?New rx faxed to [phone]. ? ?Up to date with eye exam and foot exam. ?Normal urine microalbumin ?BP at goal ?She continues with work with [employer] management program. ?LDL at goal with atorvastatin ?F/up in 15month ? ?  ?  ? Relevant Medications  ? sitaGLIPtin (JANUVIA) 25 MG tablet  ? Other Relevant Orders  ? POCT glycosylated hemoglobin (Hb A1C) (Completed)  ?  ?Follow-up: Return in about 6 months (around 10/15/2021) for DM and HTN, hyperlipidemia (fasting). ? ?CWilfred Lacy NP ?

## 2021-04-14 NOTE — Assessment & Plan Note (Addendum)
Controlled with POC HgbA1c at 6.4% today ?Current use of Januvia '50mg'$  ?Medication dose discrepancy when medication was switched from ozempic back to Januvia. Current dose should be '25mg'$ . ?She wants rx faxed to [facility] [employer]). ?New rx faxed to [phone]. ? ?Up to date with eye exam and foot exam. ?Normal urine microalbumin ?BP at goal ?She continues with work with [employer] management program. ?LDL at goal with atorvastatin ?F/up in 25month ? ?

## 2021-04-14 NOTE — Progress Notes (Signed)
Patient states she is needing Januvia 25 mg  filled as soon as possible,  and wants to know what is the least amount she can get and what would it cost her.Patient states her PCP sent the prescription to [facility], but needs some now until she gets her shipment.Notified Clinical pharmacist and [facility].  ? ?Per [facility], 30 day supply on insurance is $45, 15 day supply is $22.50. ? ?Patient states she would to do 30 day supply, and if she can get it deliver today 04/14/2021. ? ?Acute form completed and sent to the clinical pharmacist for review for delivery 04/14/2021. ? ?Per [facility], we do not have it in stock we will have to order it and deliver it tomorrow 04/15/2021. ? ?Patient Verbalized understanding.  ? ?Stacey Page,CPA ?Clinical Pharmacist Assistant ?[phone]  ? ?

## 2021-04-14 NOTE — Patient Instructions (Addendum)
Correct dose of Januvia is '25mg'$  daily. ?New rx faxed to [facility]: [phone] ?Your application is pending for approval before the prescription is sent to you ?

## 2021-04-20 ENCOUNTER — Encounter (INDEPENDENT_AMBULATORY_CARE_PROVIDER_SITE_OTHER): Payer: Self-pay | Admitting: Adult Health

## 2021-04-20 ENCOUNTER — Other Ambulatory Visit: Payer: Self-pay

## 2021-04-20 ENCOUNTER — Ambulatory Visit (INDEPENDENT_AMBULATORY_CARE_PROVIDER_SITE_OTHER): Payer: No Typology Code available for payment source | Admitting: Adult Health

## 2021-04-20 VITALS — BP 149/64 | HR 60 | Temp 97.8°F | Ht 66.0 in | Wt 347.0 lb

## 2021-04-20 DIAGNOSIS — E669 Obesity, unspecified: Secondary | ICD-10-CM | POA: Diagnosis not present

## 2021-04-20 DIAGNOSIS — E1122 Type 2 diabetes mellitus with diabetic chronic kidney disease: Secondary | ICD-10-CM | POA: Diagnosis not present

## 2021-04-20 DIAGNOSIS — Z7984 Long term (current) use of oral hypoglycemic drugs: Secondary | ICD-10-CM | POA: Diagnosis not present

## 2021-04-20 DIAGNOSIS — Z6841 Body Mass Index (BMI) 40.0 and over, adult: Secondary | ICD-10-CM

## 2021-04-20 DIAGNOSIS — N184 Chronic kidney disease, stage 4 (severe): Secondary | ICD-10-CM

## 2021-04-20 DIAGNOSIS — E1169 Type 2 diabetes mellitus with other specified complication: Secondary | ICD-10-CM

## 2021-04-20 NOTE — Progress Notes (Signed)
? ? ? ?Chief Complaint:  ? ?OBESITY ?Stacey Page is here to discuss her progress with her obesity treatment plan along with follow-up of her obesity related diagnoses. [place] is on the Category 2 Plan and states she is following her eating plan approximately 50% of the time. [place] states she is doing leg/chair exercises for 15 minutes 3-4 times per week. ? ?Today's visit was #: 61 ?Starting weight: 344 lbs ?Starting date: 03/04/2021 ?Today's weight: 347 lbs ?Today's date: 04/20/2021 ?Total lbs lost to date: 0 ?Total lbs lost since last in-office visit: 0 ? ?Interim History:  ?Reviewed bioimpedance results with patient: ?She is up 11.6 lbs muscle mass. ?She is down 10.8 lbs adipose mass. ? ?Her Grandson told her that she "wasn't huffing and puffing" when she was climbing stairs: non-scale success! ? ?Subjective:  ? ?1. Type 2 diabetes mellitus with other specified complication, without long-term current use of insulin ([place]) ?On 04/16/2021 - Januvia 25 mg- reduced for decreased renal function. ?She is not on any other antidiabetic medications. ? ?2. CKD (chronic kidney disease) stage 4, GFR 15-29 ml/min (HCC) ?02/16/21 Creat 1.90  GFR 26.66 ?04/13/21 Creat 1.76  GFR 31 ?Slight improvement in kidney fx. ?She is followed by Nephrology. ? ?Assessment/Plan:  ? ?1. Type 2 diabetes mellitus with other specified complication, without long-term current use of insulin ([place]) ?Continue Januvia. ?Decrease sugar/CHO. ?Continue chair exercises. ? ?2. CKD (chronic kidney disease) stage 4, GFR 15-29 ml/min (HCC) ?Avoid NSAIDs ?Follow-up with Nephrology. ? ?3. Obesity, current BMI 56.1 ? ?[place] is currently in the action stage of change. As such, her goal is to continue with weight loss efforts. She has agreed to the Category 2 Plan.  ? ?Exercise goals:  As is. ? ?Behavioral modification strategies: increasing lean protein intake, decreasing simple carbohydrates, meal planning and cooking strategies, keeping healthy foods in the  home, and planning for success. ? ?Darnell has agreed to follow-up with our clinic in 3 weeks. She was informed of the importance of frequent follow-up visits to maximize her success with intensive lifestyle modifications for her multiple health conditions.  ? ?Objective:  ? ?Blood pressure (!) 149/64, pulse 60, temperature 97.8 ?F (36.6 ?C), height '5\' 6"'$  (1.638 m), weight (!) 347 lb (157.4 kg), SpO2 100 %. ?Body mass index is 56.01 kg/m?. ? ?General: Cooperative, alert, well developed, in no acute distress. ?HEENT: Conjunctivae and lids unremarkable. ?Cardiovascular: Regular rhythm.  ?Lungs: Normal work of breathing. ?Neurologic: No focal deficits.  ? ?Lab Results  ?Component Value Date  ? CREATININE 1.76 (H) 04/13/2021  ? BUN 21 04/13/2021  ? NA 140 04/13/2021  ? K 4.1 04/13/2021  ? CL 104 04/13/2021  ? CO2 31 04/13/2021  ? ?Lab Results  ?Component Value Date  ? ALT 8 04/13/2021  ? AST 10 (L) 04/13/2021  ? ALKPHOS 67 04/13/2021  ? BILITOT 0.4 04/13/2021  ? ?Lab Results  ?Component Value Date  ? HGBA1C 6.4 (A) 04/14/2021  ? HGBA1C 6.1 (H) 10/14/2020  ? HGBA1C 6.1 05/14/2020  ? HGBA1C 6.0 (H) 10/28/2019  ? HGBA1C 5.9 (H) 07/04/2019  ? ?Lab Results  ?Component Value Date  ? INSULIN 8.6 10/28/2019  ? INSULIN 9.2 07/04/2019  ? INSULIN 8.8 03/25/2019  ? ?Lab Results  ?Component Value Date  ? TSH 0.51 11/28/2018  ? ?Lab Results  ?Component Value Date  ? CHOL 116 05/14/2020  ? HDL 51.60 05/14/2020  ? [place] 52 05/14/2020  ? TRIG 61.0 05/14/2020  ? CHOLHDL 2 05/14/2020  ? ?Lab Results  ?  Component Value Date  ? VD25OH 57.6 10/28/2019  ? VD25OH 41.5 07/04/2019  ? VD25OH 38.2 03/25/2019  ? ?Lab Results  ?Component Value Date  ? WBC 4.7 04/13/2021  ? HGB 11.9 (L) 04/13/2021  ? HCT 38.7 04/13/2021  ? MCV 91.9 04/13/2021  ? PLT 197 04/13/2021  ? ?Lab Results  ?Component Value Date  ? IRON 54 04/13/2021  ? TIBC 291 04/13/2021  ? FERRITIN 40 04/13/2021  ? ?Attestation Statements:  ? ?Reviewed by clinician on day of visit:  allergies, medications, problem list, medical history, surgical history, family history, social history, and previous encounter notes. ? ?Time spent on visit including pre-visit chart review and post-visit care and charting was 28 minutes.  ? ?I, Water quality scientist, CMA, am acting as Location manager for Mina Marble, NP. ? ?I have reviewed the above documentation for accuracy and completeness, and I agree with the above. -  [place] d. Maryah Marinaro,NP-C ?

## 2021-04-21 ENCOUNTER — Ambulatory Visit (INDEPENDENT_AMBULATORY_CARE_PROVIDER_SITE_OTHER): Payer: No Typology Code available for payment source | Admitting: Adult Health

## 2021-04-22 ENCOUNTER — Ambulatory Visit: Payer: No Typology Code available for payment source | Admitting: Nurse Practitioner

## 2021-04-26 ENCOUNTER — Ambulatory Visit: Payer: No Typology Code available for payment source

## 2021-04-26 DIAGNOSIS — N184 Chronic kidney disease, stage 4 (severe): Secondary | ICD-10-CM

## 2021-04-26 DIAGNOSIS — I10 Essential (primary) hypertension: Secondary | ICD-10-CM

## 2021-04-26 DIAGNOSIS — I5032 Chronic diastolic (congestive) heart failure: Secondary | ICD-10-CM

## 2021-04-26 NOTE — Chronic Care Management (AMB) (Signed)
?Chronic Care Management  ? ?CCM RN Visit Note ? ?04/26/2021 ?Name: Stacey Page MRN: [id number] DOB: 1950/03/01 ? ?Subjective: ?Stacey Page is a 71 y.o. year old female who is a primary care patient of [name], [name], NP. The care management team was consulted for assistance with disease management and care coordination needs.   ? ?Engaged with patient by telephone for follow up visit in response to provider referral for case management and/or care coordination services.  ? ?Consent to Services:  ?The patient was given information about Chronic Care Management services, agreed to services, and gave verbal consent prior to initiation of services.  Please see initial visit note for detailed documentation.  ? ?Patient agreed to services and verbal consent obtained.  ? ?Assessment: Review of patient past medical history, allergies, medications, health status, including review of consultants reports, laboratory and other test data, was performed as part of comprehensive evaluation and provision of chronic care management services.  ? ?SDOH (Social Determinants of Health) assessments and interventions performed:   ? ?CCM Care Plan ? ?No Known Allergies ? ?Outpatient Encounter Medications as of 04/26/2021  ?Medication Sig  ? acetaminophen (TYLENOL) 500 MG tablet Take 1 tablet (500 mg total) by mouth every 6 (six) hours as needed.  ? albuterol (PROAIR HFA) 108 (90 Base) MCG/ACT inhaler Inhale 2 puffs into the lungs every 6 (six) hours as needed for wheezing or shortness of breath.  ? aspirin 81 MG tablet Take 1 tablet (81 mg total) by mouth daily with breakfast.  ? atorvastatin (LIPITOR) 20 MG tablet TAKE 1 TABLET BY MOUTH  DAILY AT 6 PM.  ? carvedilol (COREG) 25 MG tablet Take 1 tablet (25 mg total) by mouth 2 (two) times daily.  ? Docusate Sodium 100 MG capsule Take 100 mg by mouth 2 (two) times daily.  ? ferrous sulfate 325 (65 FE) MG tablet Take 325 mg by mouth 2 (two) times daily with a meal.  ?  fluticasone (FLONASE) 50 MCG/ACT nasal spray Place 1 spray into both nostrils daily.  ? furosemide (LASIX) 40 MG tablet TAKE 1 TAB FOUR TIMES PER WEEK, TAKE AN ADDITIONAL TAB FOR WEIGHT GAIN OF 2 LBS OVERNIGHT OR 5 LBS IN ONE WEEK  ? gabapentin (NEURONTIN) 100 MG capsule Take 1 capsule (100 mg total) by mouth 2 (two) times daily.  ? hydrALAZINE (APRESOLINE) 25 MG tablet Take 1 tablet (25 mg total) by mouth 3 (three) times daily.  ? lisinopril (ZESTRIL) 20 MG tablet TAKE 1 TABLET BY MOUTH ONCE DAILY WITH BREAKFAST  ? montelukast (SINGULAIR) 10 MG tablet Take 1 tablet (10 mg total) by mouth at bedtime.  ? sitaGLIPtin (JANUVIA) 25 MG tablet Take 1 tablet (25 mg total) by mouth daily.  ? ?No facility-administered encounter medications on file as of 04/26/2021.  ? ? ?Patient Active Problem List  ? Diagnosis Date Noted  ? Iron deficiency anemia 01/13/2021  ? CKD (chronic kidney disease) stage 4, GFR 15-29 ml/min (HCC) 07/09/2019  ? Diabetes mellitus type 2 in obese ([place]) 07/02/2019  ? Polyneuropathy associated with underlying disease ([place]) 11/28/2018  ? Severe episode of recurrent major depressive disorder, without psychotic features ([place]) 11/28/2018  ? Type 2 diabetes mellitus with diabetic peripheral angiopathy without gangrene, without long-term current use of insulin ([place]) 11/28/2018  ? Poor short term memory 11/28/2018  ? Mild aortic stenosis by prior echocardiogram 11/06/2018  ? Diabetes mellitus ([place]) 03/21/2017  ? Symptomatic anemia 03/17/2016  ? S/P total knee arthroplasty, right 03/17/2016  ? Unilateral  primary osteoarthritis, right knee   ? Hyperlipidemia associated with type 2 diabetes mellitus ([place]) 12/15/2015  ? Pain in both lower extremities 11/19/2015  ? Numbness in both hands 08/13/2015  ? Panic attack 06/09/2015  ? Right knee pain 05/14/2015  ? Chronic renal insufficiency 09/16/2014  ? Pneumothorax on right   ? Atypical mycobacterium infection   ? ILD (interstitial lung disease) 2/2 MAI s/p med rxn   ?  Class 3 severe obesity with serious comorbidity and body mass index (BMI) of 50.0 to 59.9 in adult [facility]) 04/03/2014  ? OSA (obstructive sleep apnea) 02/25/2014  ? Hypertensive heart disease with chronic diastolic congestive heart failure ([place]) 02/25/2014  ? DOE (dyspnea on exertion) 02/25/2014  ? Essential hypertension 02/25/2014  ? ? ?Conditions to be addressed/monitored:CHF, HTN, and CKD Stage IV ? ?Care Plan : RNCM plan of care  ?Updates made by Dannielle Karvonen, RN since 04/26/2021 12:00 AM  ?  ? ?Problem: Chronic disease management education and care coordination needs.   ?Priority: High  ?  ? ?Long-Range Goal: Development of plan of chronic care to address chronic disease management and care coodination needs.   ?Start Date: 03/24/2021  ?Expected End Date: 07/07/2021  ?Priority: High  ?Note:   ?Current Barriers:  ?Knowledge Deficits related to plan of care for management of CHF, HTN, and CKD Stage IV  ?Patient reports she is taking her Januvia and Vitamin D as prescribed. She states she is feeling much better now and has more energy.  Patient states she continues with the weight management program.   She reports next follow up visit with the nephrologist is in 5 months and next follow up with primary care provider is 10/19/21.  ?RNCM Clinical Goal(s):  ?Patient will verbalize understanding of plan for management of CHF, HTN, and CKD Stage IV as evidenced by patient self report and/ or notation in chart ?take all medications exactly as prescribed and will call provider for medication related questions as evidenced by patient self report and/  or notation in chart    ?attend all scheduled medical appointments:  as evidenced by patient self report and / or notation in chart        ?continue to work with Consulting civil engineer and/or Social Worker to address care management and care coordination needs related to CHF, HTN, and CKD Stage IV as evidenced by adherence to CM Team Scheduled appointments    through collaboration  with RN Care manager, provider, and care team.  ? ?Interventions: ?1:1 collaboration with primary care provider regarding development and update of comprehensive plan of care as evidenced by provider attestation and co-signature ?Inter-disciplinary care team collaboration (see longitudinal plan of care) ?Evaluation of current treatment plan related to  self management and patient's adherence to plan as established by provider ? ?Heart Failure Interventions:  (Status:  Goal on track:  Yes.) Long Term Goal ?Discussed importance of following low sodium diet ?Reviewed Heart Failure Action Plan and zones ?Encouraged patient to obtain scale for weighing ?Discussed the importance of keeping all appointments with provider ?Reviewed medications and discussed compliance.  ? ? Chronic Kidney Disease Interventions:  (Status:  Goal on track:  Yes.) Long Term Goal ?Evaluation of current treatment plan related to chronic kidney disease self management and patient's adherence to plan as established by provider      ?Reviewed medications with patient and discussed importance of compliance    ?Reviewed scheduled/upcoming provider appointments   ?Discussed plans with patient for ongoing care management  follow up and provided patient with direct contact information for care management team    ?Last practice recorded BP readings:  ?BP Readings from Last 3 Encounters:  ?04/20/21 (!) 149/64  ?04/14/21 122/60  ?04/01/21 116/61  ?Most recent eGFR/CrCl:  ?Lab Results  ?Component Value Date  ? EGFR 33 (L) 10/14/2020  ?  No components found for: CRCL ? ? ?Hypertension Interventions:  (Status:  Goal on track:  Yes.) Long Term Goal ?Last practice recorded BP readings:  ?BP Readings from Last 3 Encounters:  ?04/20/21 (!) 149/64  ?04/14/21 122/60  ?04/01/21 116/61  ?Most recent eGFR/CrCl:  ?Lab Results  ?Component Value Date  ? EGFR 33 (L) 10/14/2020  ?  No components found for: CRCL ? ?Evaluation of current treatment plan related to hypertension  self management and patient's adherence to plan as established by provider ?Reviewed medications with patient and discussed importance of compliance ?Discussed plans with patient for ongoing care management follow u

## 2021-04-26 NOTE — Patient Instructions (Signed)
Visit Information ? ?Thank you for taking time to visit with me today. Please don't hesitate to contact me if I can be of assistance to you before our next scheduled telephone appointment. ? ?Following are the goals we discussed today:  ?Continue to take medications as prescribed   ?Attend all scheduled provider appointments ?Call pharmacy for medication refills 3-7 days in advance of running out of medications ?Call provider office for new concerns or questions  ?Consider getting  scales to weigh daily (once scales obtained, weigh daily and record weights.  Notify your doctor if you gain 3 lbs overnight or 5 lbs in a week) ?Consider monitoring blood pressure at least 2 times per week ?Continue to follow a low salt diet.  ? ?Our next appointment is by telephone on 06/16/21 at 11:00 am ? ?Please call the care guide team at [phone] if you need to cancel or reschedule your appointment.  ? ?If you are experiencing a Mental Health or [place] or need someone to talk to, please call the Suicide and Crisis Lifeline: 988 ?call 1-800-273-TALK (toll free, 24 hour hotline)  ? ?Patient verbalizes understanding of instructions and care plan provided today and agrees to view in [place]. Active MyChart status confirmed with patient.   ? ?Quinn Plowman RN,BSN,CCM ?RN Case Manager ?[place]  ?[phone] ? ?

## 2021-04-28 ENCOUNTER — Telehealth: Payer: Self-pay

## 2021-04-28 NOTE — Progress Notes (Signed)
Chronic Care Management APPOINTMENT REMINDER ? ? ?Stacey Page was reminded to have all medications, supplements and any blood glucose and blood pressure readings available for review with Junius Argyle, Pharm. D, at her telephone visit on 04/29/2021 at 3:45 pm. ? ? ? ?Patient Confirm Appointment. ? ?[name] ?Clinical Pharmacist Assistant ?[phone]  ? ?

## 2021-04-29 ENCOUNTER — Ambulatory Visit: Payer: No Typology Code available for payment source

## 2021-04-29 ENCOUNTER — Encounter: Payer: Self-pay | Admitting: Sports Medicine

## 2021-04-29 ENCOUNTER — Ambulatory Visit (INDEPENDENT_AMBULATORY_CARE_PROVIDER_SITE_OTHER): Payer: No Typology Code available for payment source | Admitting: Sports Medicine

## 2021-04-29 ENCOUNTER — Other Ambulatory Visit: Payer: Self-pay

## 2021-04-29 DIAGNOSIS — M79675 Pain in left toe(s): Secondary | ICD-10-CM | POA: Diagnosis not present

## 2021-04-29 DIAGNOSIS — M2141 Flat foot [pes planus] (acquired), right foot: Secondary | ICD-10-CM

## 2021-04-29 DIAGNOSIS — M2042 Other hammer toe(s) (acquired), left foot: Secondary | ICD-10-CM

## 2021-04-29 DIAGNOSIS — E1151 Type 2 diabetes mellitus with diabetic peripheral angiopathy without gangrene: Secondary | ICD-10-CM

## 2021-04-29 DIAGNOSIS — J309 Allergic rhinitis, unspecified: Secondary | ICD-10-CM

## 2021-04-29 DIAGNOSIS — E1142 Type 2 diabetes mellitus with diabetic polyneuropathy: Secondary | ICD-10-CM

## 2021-04-29 DIAGNOSIS — I110 Hypertensive heart disease with heart failure: Secondary | ICD-10-CM

## 2021-04-29 DIAGNOSIS — E1169 Type 2 diabetes mellitus with other specified complication: Secondary | ICD-10-CM

## 2021-04-29 DIAGNOSIS — M79674 Pain in right toe(s): Secondary | ICD-10-CM

## 2021-04-29 DIAGNOSIS — M2041 Other hammer toe(s) (acquired), right foot: Secondary | ICD-10-CM

## 2021-04-29 DIAGNOSIS — M2142 Flat foot [pes planus] (acquired), left foot: Secondary | ICD-10-CM

## 2021-04-29 DIAGNOSIS — B351 Tinea unguium: Secondary | ICD-10-CM | POA: Diagnosis not present

## 2021-04-29 DIAGNOSIS — N184 Chronic kidney disease, stage 4 (severe): Secondary | ICD-10-CM

## 2021-04-29 NOTE — Progress Notes (Signed)
? ?Chronic Care Management ?Pharmacy Note ? ?04/29/2021 ?Name:  Stacey Page MRN:  [id number] DOB:  March 17, 1950 ? ?Summary: ?Patient presents for CCM follow-up.  ?-Frequent use of Flonase due to worsening allergy symptoms.  ?-Patient has received Januvia through [employer] Patient Assistance.  ? ?Recommendations/Changes made from today's visit: ?-Decrease Flonase use to twice daily at most, advised she could utilize saline nasal spray as needed for congestion in between uses.  ? ?Plan: ?CPP follow-up 3 months ? ? ?Subjective: ?Stacey Page is an 71 y.o. year old female who is a primary patient of [name], [name], NP.  The CCM team was consulted for assistance with disease management and care coordination needs.   ? ?Engaged with patient by telephone for follow up visit to re-establish care in response to provider referral for pharmacy case management and/or care coordination services.  ? ?Consent to Services:  ?The patient was given information about Chronic Care Management services, agreed to services, and gave verbal consent prior to initiation of services.  Please see initial visit note for detailed documentation.  ? ?Patient Care Team: ?[name], [name], NP as PCP - General (Internal Medicine) ?Leonie Man, MD as PCP - Cardiology (Cardiology) ?Germaine Pomfret, [facility] as Pharmacist (Pharmacist) ?Dannielle Karvonen, RN as Case Manager ? ?Recent office visits: ?01/11/2021 Wilfred Lacy NP (PCP) Decrease Januvia to 50 mg daily ?12/22/2020 Wilfred Lacy NP (PCP) No Medication Changes noted ?11/25/2020 Quinn Plowman RN (CCM) No Medication Changes noted ?10/21/2020 Quinn Plowman RN (CCM) No Medication Changes noted ?09/24/2020 Wilfred Lacy NP (PCP) No Medication Changes noted ? ?Recent consult visits: ?03/11/2021 Karl Ito NP (Pain Medicine) No medication Changes noted, ?03/04/2021 Jearld Lesch DO (Bariatrics)No medication Changes noted, Follow up in 4 weeks ?02/25/2021 Landis Martins DPM (Podiatry) No  Medication Changes noted ?02/10/2021 Jearld Lesch DO (Bariatrics)No medication Changes noted,  Follow up in 4 weeks ?01/13/2021 Wilber Bihari NP (Oncology) No medication Changes noted ?12/29/2020 Jearld Lesch DO (Bariatrics)No medication Changes noted, Follow up in 4 weeks ?12/09/2020 Jearld Lesch DO (Bariatrics)No medication Changes noted, Follow up in 4 weeks ?11/26/2020 Titorya Stover DPM (Podiatry) No Medication Changes noted, follow up in 3 months ?11/16/2020 Dr. Chryl Heck MD (Oncology) consider oral iron trial for about 8 weeks ?11/11/2020 Jearld Lesch DO [facility]) No medication Changes noted, Ambulatory referral to Hematology / Oncology, follow up in 4 weeks ?10/14/2020 Jearld Lesch DO [facility]) No medication Changes noted, Follow up 3 weeks ?09/22/2020 Dr. Halford Chessman MD (Pulmonology)  No Medication Changes noted, Follow up 6 months ?  ? ?Hospital visits: ?None in previous 6 months ? ? ?Objective: ? ?Lab Results  ?Component Value Date  ? CREATININE 1.76 (H) 04/13/2021  ? BUN 21 04/13/2021  ? GFR 26.66 (L) 02/16/2021  ? EGFR 33 (L) 10/14/2020  ? GFRNONAA 31 (L) 04/13/2021  ? GFRAA 40 (L) 10/28/2019  ? NA 140 04/13/2021  ? K 4.1 04/13/2021  ? CALCIUM 9.5 04/13/2021  ? CO2 31 04/13/2021  ? GLUCOSE 112 (H) 04/13/2021  ? ? ?Lab Results  ?Component Value Date/Time  ? HGBA1C 6.4 (A) 04/14/2021 08:56 AM  ? HGBA1C 6.1 (H) 10/14/2020 10:57 AM  ? HGBA1C 6.1 05/14/2020 09:10 AM  ? GFR 26.66 (L) 02/16/2021 08:03 AM  ? GFR 29.58 (L) 05/21/2020 08:26 AM  ? MICROALBUR 1.4 05/14/2020 09:10 AM  ? MICROALBUR 1.9 01/16/2018 11:17 AM  ?  ?Last diabetic Eye exam:  ?Lab Results  ?Component Value Date/Time  ? HMDIABEYEEXA No Retinopathy 11/05/2020 12:00 AM  ?  ?Last  diabetic Foot exam:  ?Lab Results  ?Component Value Date/Time  ? HMDIABFOOTEX normal 09/10/2018 12:00 AM  ?  ? ?Lab Results  ?Component Value Date  ? CHOL 116 05/14/2020  ? HDL 51.60 05/14/2020  ? [place] 52 05/14/2020  ? TRIG 61.0 05/14/2020  ? CHOLHDL 2 05/14/2020  ? ? ? ?   Latest Ref Rng & Units 04/13/2021  ?  9:34 AM 02/16/2021  ?  8:03 AM 01/13/2021  ?  9:10 AM  ?Hepatic Function  ?Total Protein 6.5 - 8.1 g/dL 7.6    8.0    ?Albumin 3.5 - 5.0 g/dL 3.8   4.0   3.5    ?AST 15 - 41 U/L 10    11    ?ALT 0 - 44 U/L 8    9    ?Alk Phosphatase 38 - 126 U/L 67    70    ?Total Bilirubin 0.3 - 1.2 mg/dL 0.4    0.4    ? ? ?Lab Results  ?Component Value Date/Time  ? TSH 0.51 11/28/2018 10:42 AM  ? TSH 0.81 01/19/2017 10:13 AM  ? ? ? ?  Latest Ref Rng & Units 04/13/2021  ?  9:34 AM 01/13/2021  ?  9:10 AM 10/14/2020  ? 10:57 AM  ?CBC  ?WBC 4.0 - 10.5 K/uL 4.7   4.1     ?Hemoglobin 12.0 - 15.0 g/dL 11.9   11.7     ?Hematocrit 36.0 - 46.0 % 38.7   38.8   32.9    ?Platelets 150 - 400 K/uL 197   200     ? ? ?Lab Results  ?Component Value Date/Time  ? VD25OH 57.6 10/28/2019 09:31 AM  ? VD25OH 41.5 07/04/2019 12:40 PM  ? ? ?Clinical ASCVD: No  ?The ASCVD Risk score (Arnett DK, et al., 2019) failed to calculate for the following reasons: ?  The valid total cholesterol range is 130 to 320 mg/dL   ? ? ?  10/21/2020  ? 10:19 AM 08/04/2020  ? 11:39 AM 05/14/2020  ?  8:57 AM  ?Depression screen PHQ 2/9  ?Decreased Interest 1 0 0  ?Down, Depressed, Hopeless 0 0 0  ?PHQ - 2 Score 1 0 0  ?Altered sleeping   0  ?Tired, decreased energy   1  ?Change in appetite   0  ?Feeling bad or failure about yourself    0  ?Trouble concentrating   0  ?Moving slowly or fidgety/restless   0  ?Suicidal thoughts   0  ?PHQ-9 Score   1  ?Difficult doing work/chores   Somewhat difficult  ?  ?Social History  ? ?Tobacco Use  ?Smoking Status Former  ? Packs/day: 0.25  ? Years: 15.00  ? Pack years: 3.75  ? Types: Cigarettes  ? Quit date: 02/08/1980  ? Years since quitting: 41.2  ?Smokeless Tobacco Former  ? ?BP Readings from Last 3 Encounters:  ?04/20/21 (!) 149/64  ?04/14/21 122/60  ?04/01/21 116/61  ? ?Pulse Readings from Last 3 Encounters:  ?04/20/21 60  ?04/14/21 62  ?04/01/21 66  ? ?Wt Readings from Last 3 Encounters:  ?04/20/21 (!) 347 lb (157.4  kg)  ?04/14/21 (!) 354 lb 3.2 oz (160.7 kg)  ?04/01/21 (!) 346 lb (156.9 kg)  ? ?BMI Readings from Last 3 Encounters:  ?04/20/21 56.01 kg/m?  ?04/14/21 57.17 kg/m?  ?04/01/21 55.85 kg/m?  ? ? ?Assessment/Interventions: Review of patient past medical history, allergies, medications, health status, including review of consultants reports, laboratory and other  test data, was performed as part of comprehensive evaluation and provision of chronic care management services.  ? ?SDOH:  (Social Determinants of Health) assessments and interventions performed: Yes ?SDOH Interventions   ? ?Flowsheet Row Most Recent Value  ?SDOH Interventions   ?Financial Strain Interventions Intervention Not Indicated  ? ?  ? ?SDOH Screenings  ? ?Alcohol Screen: Low Risk   ? Last Alcohol Screening Score (AUDIT): 0  ?Depression (PHQ2-9): Low Risk   ? PHQ-2 Score: 1  ?Financial Resource Strain: Low Risk   ? Difficulty of Paying Living Expenses: Not hard at all  ?Food Insecurity: Food Insecurity Present  ? Worried About Charity fundraiser in the Last Year: Sometimes true  ? Ran Out of Food in the Last Year: Sometimes true  ?Housing: Low Risk   ? Last Housing Risk Score: 0  ?Physical Activity: Inactive  ? Days of Exercise per Week: 0 days  ? Minutes of Exercise per Session: 0 min  ?Social Connections: Moderately Isolated  ? Frequency of Communication with Friends and Family: More than three times a week  ? Frequency of Social Gatherings with Friends and Family: Twice a week  ? Attends Religious Services: More than 4 times per year  ? Active Member of Clubs or Organizations: No  ? Attends Archivist Meetings: Never  ? Marital Status: Widowed  ?Stress: No Stress Concern Present  ? Feeling of Stress : Not at all  ?Tobacco Use: Medium Risk  ? Smoking Tobacco Use: Former  ? Smokeless Tobacco Use: Former  ? Passive Exposure: Not on file  ?Transportation Needs: No Transportation Needs  ? Lack of Transportation (Medical): No  ? Lack of  Transportation (Non-Medical): No  ? ? ?CCM Care Plan ? ?No Known Allergies ? ?Medications Reviewed Today   ? ? Reviewed by Germaine Pomfret, [place] (Pharmacist) on 04/29/21 at 1609  Med List Status: <None>  ? ?Me

## 2021-04-29 NOTE — Progress Notes (Signed)
Subjective: ?Stacey Page is a 71 y.o. female patient with history of diabetes who presents to office today complaining of long,mildly painful nails and painful callus while ambulating in shoes; unable to trim and dry skin areas.  ? ?Fasting blood sugar not recorded. ?Patient Active Problem List  ? Diagnosis Date Noted  ? Iron deficiency anemia 01/13/2021  ? CKD (chronic kidney disease) stage 4, GFR 15-29 ml/min (HCC) 07/09/2019  ? Diabetes mellitus type 2 in obese ([place]) 07/02/2019  ? Polyneuropathy associated with underlying disease ([place]) 11/28/2018  ? Severe episode of recurrent major depressive disorder, without psychotic features ([place]) 11/28/2018  ? Type 2 diabetes mellitus with diabetic peripheral angiopathy without gangrene, without long-term current use of insulin ([place]) 11/28/2018  ? Poor short term memory 11/28/2018  ? Mild aortic stenosis by prior echocardiogram 11/06/2018  ? Diabetes mellitus ([place]) 03/21/2017  ? Symptomatic anemia 03/17/2016  ? S/P total knee arthroplasty, right 03/17/2016  ? Unilateral primary osteoarthritis, right knee   ? Hyperlipidemia associated with type 2 diabetes mellitus ([place]) 12/15/2015  ? Pain in both lower extremities 11/19/2015  ? Numbness in both hands 08/13/2015  ? Panic attack 06/09/2015  ? Right knee pain 05/14/2015  ? Chronic renal insufficiency 09/16/2014  ? Pneumothorax on right   ? Atypical mycobacterium infection   ? ILD (interstitial lung disease) 2/2 MAI s/p med rxn   ? Class 3 severe obesity with serious comorbidity and body mass index (BMI) of 50.0 to 59.9 in adult [facility]) 04/03/2014  ? OSA (obstructive sleep apnea) 02/25/2014  ? Hypertensive heart disease with chronic diastolic congestive heart failure ([place]) 02/25/2014  ? DOE (dyspnea on exertion) 02/25/2014  ? Essential hypertension 02/25/2014  ? ?Current Outpatient Medications on File Prior to Visit  ?Medication Sig Dispense Refill  ? acetaminophen (TYLENOL) 500 MG tablet Take 1 tablet (500 mg total) by mouth  every 6 (six) hours as needed. 30 tablet 0  ? albuterol (PROAIR HFA) 108 (90 Base) MCG/ACT inhaler Inhale 2 puffs into the lungs every 6 (six) hours as needed for wheezing or shortness of breath. 1 each 3  ? aspirin 81 MG tablet Take 1 tablet (81 mg total) by mouth daily with breakfast. 90 tablet 0  ? atorvastatin (LIPITOR) 20 MG tablet TAKE 1 TABLET BY MOUTH  DAILY AT 6 PM. 90 tablet 3  ? carvedilol (COREG) 25 MG tablet Take 1 tablet (25 mg total) by mouth 2 (two) times daily. 180 tablet 3  ? Docusate Sodium 100 MG capsule Take 100 mg by mouth 2 (two) times daily.    ? ferrous sulfate 325 (65 FE) MG tablet Take 325 mg by mouth 2 (two) times daily with a meal.    ? fluticasone (FLONASE) 50 MCG/ACT nasal spray Place 1 spray into both nostrils daily. 16 g 3  ? furosemide (LASIX) 40 MG tablet TAKE 1 TAB FOUR TIMES PER WEEK, TAKE AN ADDITIONAL TAB FOR WEIGHT GAIN OF 2 LBS OVERNIGHT OR 5 LBS IN ONE WEEK 180 tablet 3  ? gabapentin (NEURONTIN) 100 MG capsule Take 1 capsule (100 mg total) by mouth 2 (two) times daily. 180 capsule 1  ? hydrALAZINE (APRESOLINE) 25 MG tablet Take 1 tablet (25 mg total) by mouth 3 (three) times daily. 270 tablet 3  ? lisinopril (ZESTRIL) 20 MG tablet TAKE 1 TABLET BY MOUTH ONCE DAILY WITH BREAKFAST 90 tablet 3  ? montelukast (SINGULAIR) 10 MG tablet Take 1 tablet (10 mg total) by mouth at bedtime. 90 tablet 3  ?  sitaGLIPtin (JANUVIA) 25 MG tablet Take 1 tablet (25 mg total) by mouth daily. 30 tablet 0  ? ?No current facility-administered medications on file prior to visit.  ? ?No Known Allergies ? ?Recent Results (from the past 2160 hour(s))  ?Renal Function Panel     Status: Abnormal  ? Collection Time: 02/16/21  8:03 AM  ?Result Value Ref Range  ? Sodium 138 135 - 145 mEq/L  ? Potassium 4.4 3.5 - 5.1 mEq/L  ? Chloride 101 96 - 112 mEq/L  ? CO2 31 19 - 32 mEq/L  ? Albumin 4.0 3.5 - 5.2 g/dL  ? BUN 27 (H) 6 - 23 mg/dL  ? Creatinine, Ser 1.90 (H) 0.40 - 1.20 mg/dL  ? Glucose, Bld 98 70 - 99  mg/dL  ? Phosphorus 3.7 2.3 - 4.6 mg/dL  ? GFR 26.66 (L) >60.00 mL/min  ?  Comment: Calculated using the CKD-EPI Creatinine Equation (2021)  ? Calcium 9.5 8.4 - 10.5 mg/dL  ?CBC with Differential ([facility] Only)     Status: Abnormal  ? Collection Time: 04/13/21  9:34 AM  ?Result Value Ref Range  ? WBC Count 4.7 4.0 - 10.5 K/uL  ? RBC 4.21 3.87 - 5.11 MIL/uL  ? Hemoglobin 11.9 (L) 12.0 - 15.0 g/dL  ? HCT 38.7 36.0 - 46.0 %  ? MCV 91.9 80.0 - 100.0 fL  ? MCH 28.3 26.0 - 34.0 pg  ? MCHC 30.7 30.0 - 36.0 g/dL  ? RDW 15.3 11.5 - 15.5 %  ? Platelet Count 197 150 - 400 K/uL  ? nRBC 0.0 0.0 - 0.2 %  ? Neutrophils Relative % 66 %  ? Neutro Abs 3.1 1.7 - 7.7 K/uL  ? Lymphocytes Relative 22 %  ? Lymphs Abs 1.0 0.7 - 4.0 K/uL  ? Monocytes Relative 8 %  ? Monocytes Absolute 0.4 0.1 - 1.0 K/uL  ? Eosinophils Relative 3 %  ? Eosinophils Absolute 0.1 0.0 - 0.5 K/uL  ? Basophils Relative 1 %  ? Basophils Absolute 0.0 0.0 - 0.1 K/uL  ? Immature Granulocytes 0 %  ? Abs Immature Granulocytes 0.01 0.00 - 0.07 K/uL  ?  Comment: Performed at [facility], [place] [address], [place], [place] [address]  ?Iron and Iron Binding Capacity ([facility] only)     Status: None  ? Collection Time: 04/13/21  9:34 AM  ?Result Value Ref Range  ? Iron 54 28 - 170 ug/dL  ? TIBC 291 250 - 450 ug/dL  ? Saturation Ratios 19 10.4 - 31.8 %  ? UIBC 237 148 - 442 ug/dL  ?  Comment: Performed at [facility], [place] [address], [place], [place] [address]  ?Ferritin     Status: None  ? Collection Time: 04/13/21  9:34 AM  ?Result Value Ref Range  ? Ferritin 40 11 - 307 ng/mL  ?  Comment: Performed at [facility], [place] [address] [place], [place] [address]  ?Reticulocytes     Status: None  ? Collection Time: 04/13/21  9:34 AM  ?Result Value Ref Range  ? Retic Ct Pct 1.1 0.4 - 3.1 %  ? RBC. 4.11 3.87 - 5.11 MIL/uL  ? Retic Count, Absolute 44.8 19.0 - 186.0 K/uL  ? Immature Retic Fract 14.9  2.3 - 15.9 %  ?  Comment: Performed at [facility], [place] [address] [place],  [address]  ?CMP ([place] only)     Status: Abnormal  ?  Collection Time: 04/13/21  9:34 AM  ?Result Value Ref Range  ? Sodium 140 135 - 145 mmol/L  ? Potassium 4.1 3.5 - 5.1 mmol/L  ? Chloride 104 98 - 111 mmol/L  ? CO2 31 22 - 32 mmol/L  ? Glucose, Bld 112 (H) 70 - 99 mg/dL  ?  Comment: Glucose reference range applies only to samples taken after fasting for at least 8 hours.  ? BUN 21 8 - 23 mg/dL  ? Creatinine 1.76 (H) 0.44 - 1.00 mg/dL  ? Calcium 9.5 8.9 - 10.3 mg/dL  ? Total Protein 7.6 6.5 - 8.1 g/dL  ? Albumin 3.8 3.5 - 5.0 g/dL  ? AST 10 (L) 15 - 41 U/L  ? ALT 8 0 - 44 U/L  ? Alkaline Phosphatase 67 38 - 126 U/L  ? Total Bilirubin 0.4 0.3 - 1.2 mg/dL  ? GFR, Estimated 31 (L) >60 mL/min  ?  Comment: (NOTE) ?Calculated using the CKD-EPI Creatinine Equation (2021) ?  ? Anion gap 5 5 - 15  ?  Comment: Performed at [facility], [place] [address] [place], [place] [address]  ?POCT glycosylated hemoglobin (Hb A1C)     Status: Abnormal  ? Collection Time: 04/14/21  8:56 AM  ?Result Value Ref Range  ? Hemoglobin A1C 6.4 (A) 4.0 - 5.6 %  ? ? ?Objective: ?General: Patient is awake, alert, and oriented x 3 and in no acute distress. ? ?Integument: Skin is warm, dry and supple bilateral. Nails are tender, long, thickened and dystrophic with subungual debris, consistent with onychomycosis, 1-5 bilateral.  There is mild curvature of bilateral hallux nails with no acute signs of infection.  Dry skin to heels and medial first MPJ on the right foot. ? ?Vasculature:  Dorsalis Pedis pulse 1/4 bilateral. Posterior Tibial pulse 1/4 bilateral. Capillary fill time <3 sec 1-5 bilateral. Positive hair growth to the level of the digits.Temperature gradient within normal limits. No varicosities present bilateral. No edema present bilateral.  ? ?Neurology: Gross sensation present via light touch  bilateral. ? ?Musculoskeletal: Asymptomatic pes planus and lesser hammertoe pedal deformities noted bilateral. Muscular strength 5/5 in all lower extremity muscular groups bilateral without pain on range of moti

## 2021-04-29 NOTE — Patient Instructions (Signed)
Visit Information ?It was great speaking with you today!  Please let me know if you have any questions about our visit. ? ? Goals Addressed   ? ?  ?  ?  ?  ? This Visit's Progress  ?  Track and Manage Fluids and Swelling-Heart Failure     ?  Timeframe:  Long-Range Goal ?Priority:  High ?Start Date: 04/29/21                            ?Expected End Date: 04/30/22                     ? ?Follow Up within 90 days ?  ?- call office if I gain more than 2 pounds in one day or 5 pounds in one week ?- keep legs up while sitting ?- track weight in diary ?- use salt in moderation  ?  ?Why is this important?   ?It is important to check your weight daily and watch how much salt and liquids you have.  ?It will help you to manage your heart failure.  ?  ?Notes:  ?  ? ?  ? ? ?Patient Care Plan: Cardiovascular  ?Completed 03/25/2021  ? ?Problem Identified: Symptom Exacerbation (Heart Failure, Hypertension) Resolved 03/24/2021  ?Priority: High  ?  ? ?Long-Range Goal: Symptom Exacerbation Prevented or Minimized Completed 03/25/2021  ?Start Date: 10/21/2020  ?Expected End Date: 04/06/2021  ?Recent Progress: On track  ?Priority: High  ?Note:   ?Resolving due to duplicate goal ? ?Current Barriers:  ?Knowledge deficits related to long term care plan for self management of chronic health conditions:  Heart failure, Hypertension, CKD stage 4 :  Patient states she chooses not to weigh or monitor blood pressure at home. She states she feels she is able to tell when her weight goes up or when she has swelling in her legs/ feet etc. Denies any new or ongoing symptoms. Patient states she continues to go to the weight management program. Reports weight at 11/16/2020 provider office visit was 335 lbs.   Patient states she is seeing Dr. Lonell Face, oncologist for anemia. She states she was put on iron pills and stool softener.  ?Nurse Case Manager Clinical Goal(s):  ?patient will weigh self daily and record ?patient will verbalize understanding of Heart  Failure Action Plan and when to call doctor ?patient will take  medications as prescribed ?Patient will follow up with her providers as recommended ?Patient will continue to exercise at least 3-5 days per week ?Patient will adhere to weight management food plan at least 25-50%.  ?Interventions:  ?Collaboration with [name], [name], NP regarding development and update of comprehensive plan of care as evidenced by provider attestation and co-signature ?Inter-disciplinary care team collaboration (see longitudinal plan of care) ?Basic overview and discussion Heart failure/ HTN.  ?Provided  verbal education on low sodium diet ?Reviewed Heart Failure Action Plan  ?Discussed and reviewed upcoming provider visits:  Patient states she has a follow visit with her podiatrist on 11/26/2020 and a follow up visit with the nephrologist on 12/28/2020.  ?Discussed importance of daily weight ?Reviewed and discussed medications ?Provided  verbal education on heart failure symptoms, Hypertension management, and maintaining kidney health.  ?Patient Goals:  ?- Continue to take your Medications as prescribed and refill timely ?- Consider weighing daily and recording (notify MD with 3 lb weight gain over night or 5 lb in a week and/ or increase  swelling and shortness of breath, - watch for swelling in feet, ankles and legs every day, Keep legs elevated when sitting if swelling).  ?- Follow CHF Action Plan ( see education information sent to you in my chart) ?- Adhere to low sodium diet ?- Follow up with your provider as recommended. Notify provider of any new or ongoing symptoms ?- Maintain kidney health:  ?Make healthy food choices. Heart healthy, low salt ?Make physical activity part of your routine. ... ?Aim for a healthy weight. ... ?Get enough sleep. ... ?Limit alcohol intake link. ... ?Explore stress-reducing activities. ... ?Manage diabetes, high blood pressure, heart disease ? ? ?  ? ?Patient Care Plan: [facility] plan of care  ?   ? ?Problem Identified: Chronic disease management education and care coordination needs.   ?Priority: High  ?  ? ?Long-Range Goal: Development of plan of chronic care to address chronic disease management and care coodination needs.   ?Start Date: 03/24/2021  ?Expected End Date: 07/07/2021  ?Priority: High  ?Note:   ?Current Barriers:  ?Knowledge Deficits related to plan of care for management of CHF, HTN, and CKD Stage IV  ?Patient reports she is taking her Januvia and Vitamin D as prescribed. She states she is feeling much better now and has more energy.  Patient states she continues with the weight management program.   She reports next follow up visit with the nephrologist is in 5 months and next follow up with primary care provider is 10/19/21.  ?RNCM Clinical Goal(s):  ?Patient will verbalize understanding of plan for management of CHF, HTN, and CKD Stage IV as evidenced by patient self report and/ or notation in chart ?take all medications exactly as prescribed and will call provider for medication related questions as evidenced by patient self report and/  or notation in chart    ?attend all scheduled medical appointments:  as evidenced by patient self report and / or notation in chart        ?continue to work with Consulting civil engineer and/or Social Worker to address care management and care coordination needs related to CHF, HTN, and CKD Stage IV as evidenced by adherence to CM Team Scheduled appointments    through collaboration with RN Care manager, provider, and care team.  ? ?Interventions: ?1:1 collaboration with primary care provider regarding development and update of comprehensive plan of care as evidenced by provider attestation and co-signature ?Inter-disciplinary care team collaboration (see longitudinal plan of care) ?Evaluation of current treatment plan related to  self management and patient's adherence to plan as established by provider ? ?Heart Failure Interventions:  (Status:  Goal on track:   Yes.) Long Term Goal ?Discussed importance of following low sodium diet ?Reviewed Heart Failure Action Plan and zones ?Encouraged patient to obtain scale for weighing ?Discussed the importance of keeping all appointments with provider ?Reviewed medications and discussed compliance.  ? ? Chronic Kidney Disease Interventions:  (Status:  Goal on track:  Yes.) Long Term Goal ?Evaluation of current treatment plan related to chronic kidney disease self management and patient's adherence to plan as established by provider      ?Reviewed medications with patient and discussed importance of compliance    ?Reviewed scheduled/upcoming provider appointments   ?Discussed plans with patient for ongoing care management follow up and provided patient with direct contact information for care management team    ?Last practice recorded BP readings:  ?BP Readings from Last 3 Encounters:  ?04/20/21 (!) 149/64  ?04/14/21 122/60  ?04/01/21  116/61  ?Most recent eGFR/CrCl:  ?Lab Results  ?Component Value Date  ? EGFR 33 (L) 10/14/2020  ?  No components found for: CRCL ? ? ?Hypertension Interventions:  (Status:  Goal on track:  Yes.) Long Term Goal ?Last practice recorded BP readings:  ?BP Readings from Last 3 Encounters:  ?04/20/21 (!) 149/64  ?04/14/21 122/60  ?04/01/21 116/61  ?Most recent eGFR/CrCl:  ?Lab Results  ?Component Value Date  ? EGFR 33 (L) 10/14/2020  ?  No components found for: CRCL ? ?Evaluation of current treatment plan related to hypertension self management and patient's adherence to plan as established by provider ?Reviewed medications with patient and discussed importance of compliance ?Discussed plans with patient for ongoing care management follow up and provided patient with direct contact information for care management team ?Reviewed scheduled/upcoming provider appointments  ?Advised patient to continue to follow low sodium diet.  ? ?Patient Goals/Self-Care Activities: ?Continue to take medications as prescribed    ?Attend all scheduled provider appointments ?Call pharmacy for medication refills 3-7 days in advance of running out of medications ?Call provider office for new concerns or questions  ?Consider getting  scales

## 2021-05-07 ENCOUNTER — Ambulatory Visit: Payer: No Typology Code available for payment source

## 2021-05-07 DIAGNOSIS — M2141 Flat foot [pes planus] (acquired), right foot: Secondary | ICD-10-CM

## 2021-05-07 DIAGNOSIS — I130 Hypertensive heart and chronic kidney disease with heart failure and stage 1 through stage 4 chronic kidney disease, or unspecified chronic kidney disease: Secondary | ICD-10-CM

## 2021-05-07 DIAGNOSIS — E1142 Type 2 diabetes mellitus with diabetic polyneuropathy: Secondary | ICD-10-CM

## 2021-05-07 DIAGNOSIS — I509 Heart failure, unspecified: Secondary | ICD-10-CM

## 2021-05-07 DIAGNOSIS — M2041 Other hammer toe(s) (acquired), right foot: Secondary | ICD-10-CM

## 2021-05-07 DIAGNOSIS — N184 Chronic kidney disease, stage 4 (severe): Secondary | ICD-10-CM | POA: Diagnosis not present

## 2021-05-07 DIAGNOSIS — E1159 Type 2 diabetes mellitus with other circulatory complications: Secondary | ICD-10-CM | POA: Diagnosis not present

## 2021-05-07 DIAGNOSIS — Z7984 Long term (current) use of oral hypoglycemic drugs: Secondary | ICD-10-CM

## 2021-05-07 DIAGNOSIS — E785 Hyperlipidemia, unspecified: Secondary | ICD-10-CM | POA: Diagnosis not present

## 2021-05-07 DIAGNOSIS — E1122 Type 2 diabetes mellitus with diabetic chronic kidney disease: Secondary | ICD-10-CM

## 2021-05-07 NOTE — Progress Notes (Signed)
SITUATION ?Reason for Consult: Evaluation for Prefabricated Diabetic Shoes and Custom Diabetic Inserts. ?Patient / Caregiver Report: Patient would like well fitting shoes ? ?OBJECTIVE DATA: ?Patient History / Diagnosis:  ?  ICD-10-CM   ?1. Diabetic peripheral neuropathy associated with type 2 diabetes mellitus ([place])  E11.42   ?  ?2. Acquired hammertoes of both feet  M20.41   ? M20.42   ?  ?3. Pes planus of both feet  M21.41   ? M21.42   ?  ? ? ?Physician Treating Diabetes:  Abelino Derrick, MD ? ?Current or Previous Devices:   Historical user ? ?In-Person Foot Examination: ?Ulcers & Callousing:   None ?Deformities:    Hammertoes, pes planus ?Sensation:    Compromised  ?Shoe Size:     11W ? ?ORTHOTIC RECOMMENDATION ?Recommended Devices: ?- 1x pair prefabricated PDAC approved diabetic shoes; Patient Selected Shon Millet 843 Blue Size 11W ?- 3x pair custom-to-patient PDAC approved vacuum formed diabetic insoles. ? ?GOALS OF SHOES AND INSOLES ?- Reduce shear and pressure ?- Reduce / Prevent callus formation ?- Reduce / Prevent ulceration ?- Protect the fragile healing compromised diabetic foot. ? ?Patient would benefit from diabetic shoes and inserts as patient has diabetes mellitus and the patient has one or more of the following conditions: ?- History of partial or complete amputation of the foot ?- History of previous foot ulceration. ?- History of pre-ulcerative callus ?- Peripheral neuropathy with evidence of callus formation ?- Foot deformity ?- Poor circulation ? ?ACTIONS PERFORMED ?Potential out of pocket cost was communicated to patient. Patient understood and consented to measurement and casting. Patient was casted for insoles via crush box and measured for shoes via brannock device. Procedure was explained and patient tolerated procedure well. All questions were answered and concerns addressed. Casts were shipped to central fabrication for HOLD until Certificate of Medical Necessity or otherwise  necessary authorization from insurance is obtained. ? ?PLAN ?Shoes are to be ordered and casts released from hold once all appropriate paperwork is complete. Patient is to be contacted and scheduled for fitting once shoes and insoles have been fabricated and received.  ?

## 2021-05-11 ENCOUNTER — Telehealth: Payer: Self-pay | Admitting: Nurse Practitioner

## 2021-05-11 NOTE — Telephone Encounter (Signed)
Caller Name:  ?Call back phone #:  ? ?MEDICATION(S): sitaGLIPtin (JANUVIA) 25 MG tablet [[id number] ? ? ?Days of Med Remaining:  ? ?Has the patient contacted their pharmacy (YES/NO)?   ?IF YES, when and what did the pharmacy advise?  ?IF NO, request that the patient contact the pharmacy for the refills in the future.  ?           The pharmacy will send an electronic request (except for controlled medications). ? ?Preferred Pharmacy: [facility] - [place], [place] - [address]  ?[address], [place] [address]  ?Phone:  [phone]  Fax:  [phone]  ? ?~~~Please advise patient/caregiver to allow 2-3 business days to process RX refills. ? ?

## 2021-05-12 ENCOUNTER — Ambulatory Visit (INDEPENDENT_AMBULATORY_CARE_PROVIDER_SITE_OTHER): Payer: No Typology Code available for payment source | Admitting: Adult Health

## 2021-05-12 ENCOUNTER — Encounter (INDEPENDENT_AMBULATORY_CARE_PROVIDER_SITE_OTHER): Payer: Self-pay | Admitting: Adult Health

## 2021-05-12 VITALS — BP 148/60 | HR 84 | Temp 98.2°F | Ht 66.0 in | Wt 344.0 lb

## 2021-05-12 DIAGNOSIS — Z7984 Long term (current) use of oral hypoglycemic drugs: Secondary | ICD-10-CM | POA: Diagnosis not present

## 2021-05-12 DIAGNOSIS — Z6841 Body Mass Index (BMI) 40.0 and over, adult: Secondary | ICD-10-CM | POA: Diagnosis not present

## 2021-05-12 DIAGNOSIS — E1169 Type 2 diabetes mellitus with other specified complication: Secondary | ICD-10-CM

## 2021-05-12 DIAGNOSIS — E669 Obesity, unspecified: Secondary | ICD-10-CM | POA: Diagnosis not present

## 2021-05-15 DIAGNOSIS — G4733 Obstructive sleep apnea (adult) (pediatric): Secondary | ICD-10-CM | POA: Diagnosis not present

## 2021-05-19 ENCOUNTER — Other Ambulatory Visit: Payer: Self-pay | Admitting: Nurse Practitioner

## 2021-05-19 DIAGNOSIS — E1151 Type 2 diabetes mellitus with diabetic peripheral angiopathy without gangrene: Secondary | ICD-10-CM

## 2021-05-19 NOTE — Telephone Encounter (Signed)
Rx sent 

## 2021-05-19 NOTE — Telephone Encounter (Signed)
Chart supports Rx ?Last seen 04/2021 ?Next OV 06/2021 ?

## 2021-05-20 NOTE — Progress Notes (Signed)
? ? ? ?Chief Complaint:  ? ?OBESITY ?Stacey Page is here to discuss her progress with her obesity treatment plan along with follow-up of her obesity related diagnoses. [name] is on the Category 2 Plan and states she is following her eating plan approximately 50% of the time. [name] states she is doing chair exercises for 15 minutes 3 times per week. ? ?Today's visit was #: 84 ?Starting weight: 344 lbs ?Starting date: 03/04/2021 ?Today's weight: 344 lbs ?Today's date: 05/12/2021 ?Total lbs lost to date: 0 ?Total lbs lost since last in-office visit: 3 lbs ? ?Interim History:  ?[name] provided the following food recall of a "typical" intake for a day: ?Breakfast:  Oatmeal/bacon, egg, cheese. ?Lunch - 4 oz meat sandwich. ?[place] with protein (tuna, chicken - baked,broiled) ? ?Subjective:  ? ?1. Type 2 diabetes mellitus with other specified complication, without long-term current use of insulin (Stacey Page) ?PCP manages diabetes - Januvia 25 mg daily. ?She denies sx's of hypoglycemia. ? ?Assessment/Plan:  ? ?1. Type 2 diabetes mellitus with other specified complication, without long-term current use of insulin (Stacey Page) ?Continue daily Januvia 25 mg daily per PCP. ? ?2. Obesity, current BMI 55.6 ? ?[name] is currently in the action stage of change. As such, her goal is to continue with weight loss efforts. She has agreed to the Category 2 Plan and keeping a food journal and adhering to recommended goals of 400-500 calories and 35 grams of protein. ? ?Reduction - Na+, CHO/sugar intake. ? ?Handout:  Additional Breakfast Options, Additional Lunch Options.  ? ?Exercise goals:  As is. ? ?Behavioral modification strategies: increasing lean protein intake, decreasing simple carbohydrates, meal planning and cooking strategies, keeping healthy foods in the home, better snacking choices, and planning for success. ? ?Stacey Page has agreed to follow-up with our clinic in 4 weeks. She was informed of the importance of frequent  follow-up visits to maximize her success with intensive lifestyle modifications for her multiple health conditions.  ? ?Objective:  ? ?Blood pressure (!) 148/60, pulse 84, temperature 98.2 ?F (36.8 ?C), height '5\' 6"'$  (1.676 m), weight (!) 344 lb (156 kg), SpO2 99 %. ?Body mass index is 55.52 kg/m?. ? ?General: Cooperative, alert, well developed, in no acute distress. ?HEENT: Conjunctivae and lids unremarkable. ?Cardiovascular: Regular rhythm.  ?Lungs: Normal work of breathing. ?Neurologic: No focal deficits.  ? ?Lab Results  ?Component Value Date  ? CREATININE 1.76 (H) 04/13/2021  ? BUN 21 04/13/2021  ? NA 140 04/13/2021  ? K 4.1 04/13/2021  ? CL 104 04/13/2021  ? CO2 31 04/13/2021  ? ?Lab Results  ?Component Value Date  ? ALT 8 04/13/2021  ? AST 10 (L) 04/13/2021  ? ALKPHOS 67 04/13/2021  ? BILITOT 0.4 04/13/2021  ? ?Lab Results  ?Component Value Date  ? HGBA1C 6.4 (A) 04/14/2021  ? HGBA1C 6.1 (H) 10/14/2020  ? HGBA1C 6.1 05/14/2020  ? HGBA1C 6.0 (H) 10/28/2019  ? HGBA1C 5.9 (H) 07/04/2019  ? ?Lab Results  ?Component Value Date  ? INSULIN 8.6 10/28/2019  ? INSULIN 9.2 07/04/2019  ? INSULIN 8.8 03/25/2019  ? ?Lab Results  ?Component Value Date  ? TSH 0.51 11/28/2018  ? ?Lab Results  ?Component Value Date  ? CHOL 116 05/14/2020  ? HDL 51.60 05/14/2020  ? [place] 52 05/14/2020  ? TRIG 61.0 05/14/2020  ? CHOLHDL 2 05/14/2020  ? ?Lab Results  ?Component Value Date  ? VD25OH 57.6 10/28/2019  ? VD25OH 41.5 07/04/2019  ? VD25OH 38.2 03/25/2019  ? ?Lab Results  ?  Component Value Date  ? WBC 4.7 04/13/2021  ? HGB 11.9 (L) 04/13/2021  ? HCT 38.7 04/13/2021  ? MCV 91.9 04/13/2021  ? PLT 197 04/13/2021  ? ?Lab Results  ?Component Value Date  ? IRON 54 04/13/2021  ? TIBC 291 04/13/2021  ? FERRITIN 40 04/13/2021  ? ?Attestation Statements:  ? ?Reviewed by clinician on day of visit: allergies, medications, problem list, medical history, surgical history, family history, social history, and previous encounter notes. ? ?Time spent on  visit including pre-visit chart review and post-visit care and charting was 28 minutes.  ? ?I, Water quality scientist, CMA, am acting as Location manager for Mina Marble, NP. ? ?I have reviewed the above documentation for accuracy and completeness, and I agree with the above. - Eneida Evers d. Jaedyn Lard, NP-C ?

## 2021-05-25 ENCOUNTER — Telehealth: Payer: Self-pay

## 2021-05-25 NOTE — Progress Notes (Signed)
Patient requesting a refill of albuterol 108 MCG/ACT and fluticasone 50 MCG/ACT to be deliver on 05/25/2021 for 90 day supply. ? ?I Completed acute form and sent to [facility] for processing.Clinical pharmacist notified.  ? ?Per [facility] pharmacy, albuterol not due until 06/08/21 and fluticasone not due until 06/23/21, so those are both slightly too soon to fill, and both need refills.The fluticasone was filled for 90DS with other medications, so it is due the same time as everything else. The albuterol was filled for 75DS  so it will be due before everything else ? ?Stacey Page,CPA ?Clinical Pharmacist Assistant ?[phone]  ? ?

## 2021-05-31 ENCOUNTER — Encounter: Payer: Self-pay | Admitting: Cardiology

## 2021-05-31 ENCOUNTER — Ambulatory Visit (INDEPENDENT_AMBULATORY_CARE_PROVIDER_SITE_OTHER): Payer: No Typology Code available for payment source | Admitting: Cardiology

## 2021-05-31 VITALS — BP 148/64 | HR 63 | Ht 66.0 in | Wt 349.2 lb

## 2021-05-31 DIAGNOSIS — Z6841 Body Mass Index (BMI) 40.0 and over, adult: Secondary | ICD-10-CM

## 2021-05-31 DIAGNOSIS — I350 Nonrheumatic aortic (valve) stenosis: Secondary | ICD-10-CM | POA: Diagnosis not present

## 2021-05-31 DIAGNOSIS — J849 Interstitial pulmonary disease, unspecified: Secondary | ICD-10-CM

## 2021-05-31 DIAGNOSIS — G4733 Obstructive sleep apnea (adult) (pediatric): Secondary | ICD-10-CM

## 2021-05-31 DIAGNOSIS — E785 Hyperlipidemia, unspecified: Secondary | ICD-10-CM | POA: Diagnosis not present

## 2021-05-31 DIAGNOSIS — E1169 Type 2 diabetes mellitus with other specified complication: Secondary | ICD-10-CM | POA: Diagnosis not present

## 2021-05-31 DIAGNOSIS — E1151 Type 2 diabetes mellitus with diabetic peripheral angiopathy without gangrene: Secondary | ICD-10-CM

## 2021-05-31 DIAGNOSIS — R0609 Other forms of dyspnea: Secondary | ICD-10-CM | POA: Diagnosis not present

## 2021-05-31 DIAGNOSIS — I110 Hypertensive heart disease with heart failure: Secondary | ICD-10-CM | POA: Diagnosis not present

## 2021-05-31 DIAGNOSIS — I10 Essential (primary) hypertension: Secondary | ICD-10-CM

## 2021-05-31 DIAGNOSIS — I5032 Chronic diastolic (congestive) heart failure: Secondary | ICD-10-CM

## 2021-05-31 MED ORDER — HYDRALAZINE HCL 50 MG PO TABS: 50.0000 mg | ORAL_TABLET | Freq: Three times a day (TID) | ORAL | 3 refills | Status: DC

## 2021-05-31 MED ORDER — ISOSORBIDE DINITRATE 20 MG PO TABS: ORAL_TABLET | ORAL | 3 refills | Status: DC

## 2021-05-31 NOTE — Patient Instructions (Addendum)
Medication Instructions:  ? ? ? Hydralazine  50 mg  ( 2 tablets of 25 mg )  three times a day  ? ? New  medication Isosorbide Dinitrate 20 mg ( 1 tablet ) take in the morning  and evening  with 50 mg Hydralazine  ? ? ? ? ?*If you need a refill on your cardiac medications before your next appointment, please call your pharmacy* ? ? ?Lab Work: ?Not needed ? ? ? ?Testing/Procedures: ? ?Will be schedule at [facility] [address] ?Your physician has requested that you have an echocardiogram. Echocardiography is a painless test that uses sound waves to create images of your heart. It provides your doctor with information about the size and shape of your heart and how well your heart?s chambers and valves are working. This procedure takes approximately one hour. There are no restrictions for this procedure. ? ?And ? ?Will be schedule at  [facility]  [address]  ?Your physician has recommended that you have a cardiopulmonary stress test (CPX). CPX testing is a non-invasive measurement of heart and lung function. It replaces a traditional treadmill stress test. This type of test provides a tremendous amount of information that relates not only to your present condition but also for future outcomes. This test combines measurements of you ventilation, respiratory gas exchange in the lungs, electrocardiogram (EKG), blood pressure and physical response before, during, and following an exercise protocol.  ? ?Follow-Up: ?At [facility], you and your health needs are our priority.  As part of our continuing mission to provide you with exceptional heart care, we have created designated Provider Care Teams.  These Care Teams include your primary Cardiologist (physician) and Advanced Practice Providers (APPs -  Physician Assistants and Nurse Practitioners) who all work together to provide you with the care you need, when you need it. ? ?  ? ?Your next appointment:   ?2  month(s) ? ?The format for your next appointment:   ?In Person ? ?Provider:   ?Glenetta Hew, MD  ? ? ?Other Instructions  ?

## 2021-05-31 NOTE — Progress Notes (Signed)
? ? ?Primary Care Provider: Flossie Buffy, NP ?Cardiologist: Glenetta Hew, MD ?Electrophysiologist: None ? ?Clinic Note: ?Chief Complaint  ?Patient presents with  ? Follow-up  ?  Delay in 6 months  ? Shortness of Breath  ?  Exertional dyspnea-seems chronic  ? Aortic Stenosis  ?  Last echo in September 2021  ? ? ?=================================== ? ?ASSESSMENT/PLAN  ? ?Problem List Items Addressed This Visit   ? ?  ? Cardiology Problems  ? Hypertensive heart disease with chronic diastolic congestive heart failure (HCC) (Chronic)  ?  Blood pressure still pretty high today.  With her significant obesity, will be difficult to manage. ? ?She is now doing 4 times a week Lasix but not requiring any additional doses of her symptoms.   ?Discussed sliding scale dosing. ?On high-dose carvedilol 25 mg twice daily.  With heart rate point of 63, W increasing further.   ?(However we could potentially increase to 37.5 mg twice daily if necessary. ?On lisinopril 20 mg daily-not likely to get much benefit from increasing dose further.  (However, if there is evidence of proteinuria, could consider adding ARB) ?Recently added hydralazine 25 mg 3 times daily, will increase to 50 mg 3 times daily and add Isordil 20 mg TID for additional afterload reduction. ?  ?  ? Relevant Medications  ? hydrALAZINE (APRESOLINE) 50 MG tablet  ? isosorbide dinitrate (ISORDIL) 20 MG tablet  ? Other Relevant Orders  ? EKG 12-Lead (Completed)  ? ECHOCARDIOGRAM COMPLETE  ? Cardiopulmonary exercise test  ? Hyperlipidemia associated with type 2 diabetes mellitus (HCC) (Chronic)  ?  Lipids were previously well controlled.  Remains on atorvastatin 20 mg daily. ? ?Due for labs checked this year.  Previously LDLc was 52 in April 2022 ? ?Only on Januvia-having converted from [place].??  Not sure of the reason why GLP-1 agonist was discontinued, but with her obesity, may be beneficial.  ? ?  ?  ? Relevant Medications  ? hydrALAZINE (APRESOLINE) 50 MG  tablet  ? isosorbide dinitrate (ISORDIL) 20 MG tablet  ? Other Relevant Orders  ? ECHOCARDIOGRAM COMPLETE  ? Cardiopulmonary exercise test  ? Mild aortic stenosis by prior echocardiogram - Primary (Chronic)  ?  Plan was to follow-up echocardiogram either in 2023 or 24. ? ?With therapy significant dyspnea, we will go ahead and recheck echo for both aortic valvular disease as well as diastolic dysfunction and RV pressures. ? ?  ?  ? Relevant Medications  ? hydrALAZINE (APRESOLINE) 50 MG tablet  ? isosorbide dinitrate (ISORDIL) 20 MG tablet  ? Other Relevant Orders  ? EKG 12-Lead (Completed)  ? ECHOCARDIOGRAM COMPLETE  ? Cardiopulmonary exercise test  ? Type 2 diabetes mellitus with diabetic peripheral angiopathy without gangrene, without long-term current use of insulin (HCC) (Chronic)  ?  Last A1c was 6.4.  Pretty well controlled.  She is on Januvia-having previously been on Ozempic ? ?Will defer to PCP/endocrinology.  A GLP-1 agonist would be beneficial for her in addition to Januvia. ? ?On gabapentin for neuropathy ? ?She is also atorvastatin 20 mg daily with well-controlled lipids as of last year.  Has not had labs checked this year for lipids. ? ?  ?  ? Relevant Medications  ? hydrALAZINE (APRESOLINE) 50 MG tablet  ? isosorbide dinitrate (ISORDIL) 20 MG tablet  ? Other Relevant Orders  ? EKG 12-Lead (Completed)  ? ECHOCARDIOGRAM COMPLETE  ? Cardiopulmonary exercise test  ?  ? Other  ? DOE (dyspnea on exertion)  ?  Clearly multifactorial with morbid obesity with likely OHS/OSA, ILD, significant deconditioning especially with recent foot injury and lack of exercise. ? ?To evaluate etiology of dyspnea, we will check a CPX which will include PFTs. ?Also checking 2D echo. ? ?  ?  ? Relevant Orders  ? EKG 12-Lead (Completed)  ? ECHOCARDIOGRAM COMPLETE  ? Cardiopulmonary exercise test  ? Class 3 severe obesity with serious comorbidity and body mass index (BMI) of 50.0 to 59.9 in adult [facility])  ?  Unfortunately, she gained  back a lot of the weight that she previously lost.  Needs to really work on that.  She had been on GLP-1 agonist, but no longer. ? ?She probably needs help and wellness clinic evaluation and even potential consideration for bariatric surgery.. ? ?  ?  ? Relevant Orders  ? EKG 12-Lead (Completed)  ? ECHOCARDIOGRAM COMPLETE  ? Cardiopulmonary exercise test  ? OSA (obstructive sleep apnea) (Chronic)  ?  Continue to use CPAP.  Reassess RV pressures by echo. ? ?  ?  ? Relevant Orders  ? EKG 12-Lead (Completed)  ? ECHOCARDIOGRAM COMPLETE  ? Cardiopulmonary exercise test  ? ILD (interstitial lung disease) 2/2 MAI s/p med rxn (Chronic)  ? Relevant Orders  ? EKG 12-Lead (Completed)  ? ECHOCARDIOGRAM COMPLETE  ? Cardiopulmonary exercise test  ? ? ?=================================== ? ?HPI:   ? ?Stacey Page is a Super Morbidly Obese 71 y.o. female with a PMH Notable for Hypertensive Heart Disease with HFpEF,OSA with CPAP (likely OHS), HLD and DM-2 (with diabetic peripheral angiopathy/neuropathy), CKD 3B-4 who presents today for 25-monthfollow-up. ? She follows up today at the request of [name], CCharlene Brooke NP. ? ?Stacey Pressmanwas last seen by me in October 2021 (reviewing results of echo noted below).  She was subsequently seen by CLaurann Montana NP in July 2022 as a ER follow-up for dyspnea, fever and headache.  She had left due to extended wait..  Negative for COVID the BNP was mildly elevated.  Chest x-ray showed some cardiac enlargement with peribronchial changes consistent with pulmonary edema versus pneumonia. => Fever has subsided and dyspnea is back to baseline.  No formal exercise but does walk.  Has 2 grandsons and enjoys spending time with them.  Walking exercises.  Followed by podiatry.  Nephrologist reduced her Lasix to 3 times a week (currently at 4 times a week).  No other symptoms. ?-> Discussed sliding scale diuretic. ?Mild aortic stenosis noted on echo.  Plan for follow-up echocardiogram in roughly  3 years ?Recommended [facility] PREP program, wanted to wait till her foot pain healed. ? ?Recent Hospitalizations: None since the abbreviated ER visit ? ?Reviewed  CV studies:   ? ?The following studies were reviewed today: (if available, images/films reviewed: From Epic Chart or Care Everywhere) ?TTE 11/04/2019: EF 60 to 65%.  Normal LV size and systolic function.  Moderate LVH.  GR 2 DD with mild LA dilation.  Mild MR.  Mild AS (no change 19)-mean gradient 13 mmHg (peak 29 mmHg).  Normal RAP and PAP. ? ? ?Interval History:  ? ?Stacey Page here today for follow-up stating that she is always short of breath.  Usually okay at rest, but with any activity she gets short of breath.   ?Walking limited by foot pain & unsteady gait.   ?Rare palpitations - nothing sustained or prolonged -- no associated Sx. . No CP @ rest or with exertion. => Every now and then she uses her pedal machine at the  house, but admits that she is somewhat lazy and does not do it as much she should.  As such, she is very deconditioned. ? ?No PND/orthopnea with minimal edema (no aditional Lasix use beyond her 4 times a week dosing.).  She sleeps with CPAP so is hard to tell about PND and orthopnea. ?No dizziness, near syncope/syncope.   ?No rapid irregular heartbeats or significant palpitations just a few skipped beats but does not bother her.  Her heart rate will go fast with activity though. ? ?CV Review of Symptoms (Summary) ?Cardiovascular ROS: positive for - dyspnea on exertion, edema, shortness of breath, and rapid heart rate response to exercise; exercise more limited by foot pain than dyspnea. ?negative for - chest pain, irregular heartbeat, orthopnea, paroxysmal nocturnal dyspnea, shortness of breath, or syncope or near syncope TIA versus BX of claudication ? ?REVIEWED OF SYSTEMS  ? ?Review of Systems  ?Constitutional:  Positive for malaise/fatigue. Negative for weight loss.  ?HENT:  Negative for congestion and nosebleeds.    ?Respiratory:  Positive for shortness of breath (with any exertion).   ?     Sleep better using CPAP.  ?Cardiovascular:  Positive for leg swelling (controlled).  ?     Per HPI  ?Gastrointestinal:  Negative for

## 2021-06-01 ENCOUNTER — Telehealth: Payer: Self-pay | Admitting: Nurse Practitioner

## 2021-06-01 NOTE — Telephone Encounter (Signed)
Pt is wanting to transfer her care from [facility] to Dr. Ethelene Hal, she feels this will be a better fit. Just let me know and I'll  call her [phone] ?

## 2021-06-03 ENCOUNTER — Other Ambulatory Visit: Payer: Self-pay | Admitting: Nurse Practitioner

## 2021-06-03 MED ORDER — ALBUTEROL SULFATE HFA 108 (90 BASE) MCG/ACT IN AERS: 2.0000 | INHALATION_SPRAY | Freq: Four times a day (QID) | RESPIRATORY_TRACT | 3 refills | Status: DC | PRN

## 2021-06-03 NOTE — Addendum Note (Signed)
Addended by: Daron Offer A on: 06/03/2021 02:42 PM ? ? Modules accepted: Orders ? ?

## 2021-06-04 MED ORDER — ALBUTEROL SULFATE HFA 108 (90 BASE) MCG/ACT IN AERS: 1.0000 | INHALATION_SPRAY | Freq: Four times a day (QID) | RESPIRATORY_TRACT | 3 refills | Status: DC | PRN

## 2021-06-04 NOTE — Addendum Note (Signed)
Addended by: Daron Offer A on: 06/04/2021 09:38 AM ? ? Modules accepted: Orders ? ?

## 2021-06-09 NOTE — Telephone Encounter (Signed)
Duplicate request

## 2021-06-13 ENCOUNTER — Encounter: Payer: Self-pay | Admitting: Cardiology

## 2021-06-13 ENCOUNTER — Other Ambulatory Visit: Payer: Self-pay | Admitting: Nurse Practitioner

## 2021-06-13 NOTE — Assessment & Plan Note (Signed)
Lipids were previously well controlled.  Remains on atorvastatin 20 mg daily. ? ?Due for labs checked this year.  Previously LDLc was 52 in April 2022 ? ?Only on Januvia-having converted from [place].??  Not sure of the reason why GLP-1 agonist was discontinued, but with her obesity, may be beneficial.  ?

## 2021-06-13 NOTE — Assessment & Plan Note (Signed)
Unfortunately, she gained back a lot of the weight that she previously lost.  Needs to really work on that.  She had been on GLP-1 agonist, but no longer. ? ?She probably needs help and wellness clinic evaluation and even potential consideration for bariatric surgery.Marland Kitchen ?

## 2021-06-13 NOTE — Assessment & Plan Note (Signed)
Clearly multifactorial with morbid obesity with likely OHS/OSA, ILD, significant deconditioning especially with recent foot injury and lack of exercise. ? ?To evaluate etiology of dyspnea, we will check a CPX which will include PFTs. ?Also checking 2D echo. ?

## 2021-06-13 NOTE — Assessment & Plan Note (Addendum)
Blood pressure still pretty high today.  With her significant obesity, will be difficult to manage. ? ?? She is now doing 4 times a week Lasix but not requiring any additional doses of her symptoms.   ?? Discussed sliding scale dosing. ?? On high-dose carvedilol 25 mg twice daily.  With heart rate point of 63, W increasing further.   ?? (However we could potentially increase to 37.5 mg twice daily if necessary. ?? On lisinopril 20 mg daily-not likely to get much benefit from increasing dose further.  (However, if there is evidence of proteinuria, could consider adding ARB) ?? Recently added hydralazine 25 mg 3 times daily, will increase to 50 mg 3 times daily and add Isordil 20 mg TID for additional afterload reduction. ?

## 2021-06-13 NOTE — Assessment & Plan Note (Addendum)
Last A1c was 6.4.  Pretty well controlled.  She is on Januvia-having previously been on Ozempic ? ?Will defer to PCP/endocrinology.  A GLP-1 agonist would be beneficial for her in addition to Januvia. ? ?On gabapentin for neuropathy ? ?She is also atorvastatin 20 mg daily with well-controlled lipids as of last year.  Has not had labs checked this year for lipids. ?

## 2021-06-13 NOTE — Assessment & Plan Note (Addendum)
Plan was to follow-up echocardiogram either in 2023 or 24. ? ?With therapy significant dyspnea, we will go ahead and recheck echo for both aortic valvular disease as well as diastolic dysfunction and RV pressures. ?

## 2021-06-13 NOTE — Assessment & Plan Note (Signed)
Continue to use CPAP.  Reassess RV pressures by echo. ?

## 2021-06-14 ENCOUNTER — Ambulatory Visit (HOSPITAL_COMMUNITY): Payer: No Typology Code available for payment source | Attending: Cardiology

## 2021-06-14 DIAGNOSIS — G4733 Obstructive sleep apnea (adult) (pediatric): Secondary | ICD-10-CM | POA: Diagnosis not present

## 2021-06-14 DIAGNOSIS — R0609 Other forms of dyspnea: Secondary | ICD-10-CM | POA: Insufficient documentation

## 2021-06-14 DIAGNOSIS — E1169 Type 2 diabetes mellitus with other specified complication: Secondary | ICD-10-CM | POA: Diagnosis not present

## 2021-06-14 DIAGNOSIS — I110 Hypertensive heart disease with heart failure: Secondary | ICD-10-CM | POA: Insufficient documentation

## 2021-06-14 DIAGNOSIS — I350 Nonrheumatic aortic (valve) stenosis: Secondary | ICD-10-CM | POA: Diagnosis not present

## 2021-06-14 DIAGNOSIS — Z6841 Body Mass Index (BMI) 40.0 and over, adult: Secondary | ICD-10-CM

## 2021-06-14 DIAGNOSIS — J849 Interstitial pulmonary disease, unspecified: Secondary | ICD-10-CM | POA: Diagnosis not present

## 2021-06-14 DIAGNOSIS — I5032 Chronic diastolic (congestive) heart failure: Secondary | ICD-10-CM

## 2021-06-14 DIAGNOSIS — E785 Hyperlipidemia, unspecified: Secondary | ICD-10-CM | POA: Insufficient documentation

## 2021-06-14 DIAGNOSIS — E1151 Type 2 diabetes mellitus with diabetic peripheral angiopathy without gangrene: Secondary | ICD-10-CM | POA: Insufficient documentation

## 2021-06-14 DIAGNOSIS — E66813 Obesity, class 3: Secondary | ICD-10-CM

## 2021-06-14 HISTORY — PX: TRANSTHORACIC ECHOCARDIOGRAM: SHX275

## 2021-06-14 LAB — ECHOCARDIOGRAM COMPLETE
AR max vel: 1.67 cm2
AV Area VTI: 1.73 cm2
AV Area mean vel: 1.62 cm2
AV Mean grad: 18 mmHg
AV Peak grad: 26.5 mmHg
Ao pk vel: 2.58 m/s
Area-P 1/2: 3.17 cm2
S' Lateral: 3 cm

## 2021-06-14 NOTE — Telephone Encounter (Signed)
Chart supports rx refill ?Last ov: 03/25/21 ? ?Last refill: 04/14/21 ?

## 2021-06-16 ENCOUNTER — Encounter (INDEPENDENT_AMBULATORY_CARE_PROVIDER_SITE_OTHER): Payer: Self-pay | Admitting: Adult Health

## 2021-06-16 ENCOUNTER — Telehealth: Payer: No Typology Code available for payment source

## 2021-06-16 ENCOUNTER — Ambulatory Visit (INDEPENDENT_AMBULATORY_CARE_PROVIDER_SITE_OTHER): Payer: No Typology Code available for payment source | Admitting: Adult Health

## 2021-06-16 ENCOUNTER — Encounter (HOSPITAL_COMMUNITY): Payer: No Typology Code available for payment source

## 2021-06-16 ENCOUNTER — Telehealth: Payer: Self-pay

## 2021-06-16 VITALS — BP 114/64 | HR 73 | Temp 97.7°F | Ht 66.0 in | Wt 345.0 lb

## 2021-06-16 DIAGNOSIS — I110 Hypertensive heart disease with heart failure: Secondary | ICD-10-CM | POA: Diagnosis not present

## 2021-06-16 DIAGNOSIS — E1169 Type 2 diabetes mellitus with other specified complication: Secondary | ICD-10-CM

## 2021-06-16 DIAGNOSIS — Z7984 Long term (current) use of oral hypoglycemic drugs: Secondary | ICD-10-CM

## 2021-06-16 DIAGNOSIS — I5032 Chronic diastolic (congestive) heart failure: Secondary | ICD-10-CM | POA: Diagnosis not present

## 2021-06-16 DIAGNOSIS — E669 Obesity, unspecified: Secondary | ICD-10-CM

## 2021-06-16 DIAGNOSIS — Z6841 Body Mass Index (BMI) 40.0 and over, adult: Secondary | ICD-10-CM | POA: Diagnosis not present

## 2021-06-16 NOTE — Telephone Encounter (Signed)
?  Care Management  ? ?Follow Up Note ? ? ?06/16/2021 ?Name: Stacey Page MRN: [id number] DOB: 1950/04/16 ? ? ?Referred by: Flossie Buffy, NP ?Reason for referral : Chronic Care Management ? ? ?An unsuccessful telephone outreach was attempted today. The patient was referred to the case management team for assistance with care management and care coordination.  ? ?Follow Up Plan: A HIPPA compliant phone message was left for the patient providing contact information and requesting a return call.   The care management team will attempt follow up outreach call to patient within 2 weeks.  ? ?Quinn Plowman RN,BSN,CCM ?RN Case Manager ?[place] ?[phone] ? ?

## 2021-06-17 ENCOUNTER — Telehealth: Payer: Self-pay

## 2021-06-17 DIAGNOSIS — G63 Polyneuropathy in diseases classified elsewhere: Secondary | ICD-10-CM

## 2021-06-17 NOTE — Progress Notes (Signed)
Chronic Care Management Pharmacy Assistant   Name: Stacey Page  MRN: [id number] DOB: 01/10/1951  Reason for Encounter: Medication Review/Medication Coordination Call.   Recent office visits:  No recent office visit  Recent consult visits:  06/16/2021 Mina Marble NP (Weight Management) No Medication Changes noted 05/31/2021 Dr. Ellyn Hack MD (Cardiology) Start Isosorbide dinitrate 20 mg in the morning and evening with Hydralazine 50 mg dose, Increase Hydralazine to 50 mg 3 times daily. 05/12/2021 Mina Marble NP (Weight Management) No Medication Changes noted 05/07/2021 Guadlupe Spanish (Orthotics) No Medication Changes noted  Hospital visits:  None in previous 6 months  Medications: Outpatient Encounter Medications as of 06/17/2021  Medication Sig Note   acetaminophen (TYLENOL) 500 MG tablet Take 1 tablet (500 mg total) by mouth every 6 (six) hours as needed. 04/29/2021: Rarely uses   albuterol (PROAIR HFA) 108 (90 Base) MCG/ACT inhaler Inhale 1-2 puffs into the lungs every 6 (six) hours as needed for wheezing or shortness of breath.    aspirin 81 MG tablet Take 1 tablet (81 mg total) by mouth daily with breakfast.    atorvastatin (LIPITOR) 20 MG tablet TAKE 1 TABLET BY MOUTH  DAILY AT 6 PM.    carvedilol (COREG) 25 MG tablet Take 1 tablet (25 mg total) by mouth 2 (two) times daily.    cholecalciferol (VITAMIN D3) 25 MCG (1000 UNIT) tablet Take 2,000 Units by mouth daily.    Docusate Sodium 100 MG capsule Take 100 mg by mouth 2 (two) times daily.    ferrous sulfate 325 (65 FE) MG tablet Take 325 mg by mouth 2 (two) times daily with a meal.    fluticasone (FLONASE) 50 MCG/ACT nasal spray INSTILL 1 SPRAY IN EACH NOSTRIL DAILY    furosemide (LASIX) 40 MG tablet TAKE 1 TAB FOUR TIMES PER WEEK, TAKE AN ADDITIONAL TAB FOR WEIGHT GAIN OF 2 LBS OVERNIGHT OR 5 LBS IN ONE WEEK    gabapentin (NEURONTIN) 100 MG capsule Take 1 capsule (100 mg total) by mouth 2 (two) times daily.    hydrALAZINE  (APRESOLINE) 50 MG tablet Take 1 tablet (50 mg total) by mouth 3 (three) times daily.    isosorbide dinitrate (ISORDIL) 20 MG tablet Take 1 tablet ( 20 mg total ) by mouth in morning and evening with Hydralazine 50 mg  dose    JANUVIA 25 MG tablet TAKE ONE TABLET BY MOUTH ONCE DAILY    lisinopril (ZESTRIL) 20 MG tablet TAKE 1 TABLET BY MOUTH ONCE DAILY WITH BREAKFAST    montelukast (SINGULAIR) 10 MG tablet Take 1 tablet (10 mg total) by mouth at bedtime.    No facility-administered encounter medications on file as of 06/17/2021.    Care Gaps: Tetanus Vaccine Foot Exam COVID-19 Vaccine   Star Rating Drugs: Atorvastatin 20 mg last filled 03/25/2021 90 day supply at [facility]. Lisinopril 20 mg last filled 03/25/2021 45 day supply at [facility].  Medication Fill Gaps: None ID  Reviewed chart for medication changes ahead of medication coordination call.  BP Readings from Last 3 Encounters:  06/16/21 114/64  05/31/21 (!) 148/64  05/12/21 (!) 148/60    Lab Results  Component Value Date   HGBA1C 6.4 (A) 04/14/2021     Patient obtains medications through Vials  90 Days   Last adherence delivery included:   Lisinopril 20 mg one tablet daily Montelukast 10 mg one tablet daily  Atorvastatin 20 mg one tablet daily- Carvedilol 25 mg one tablet twice daily Gabapentin 100 mg one  capsule 2 times daily Flonase 50 MCG/ACT nasal spray   Albuterol 108 MCG/ACT inhaler- PRN  Patient declined Medications last month: Furosemide 40 mg one tablet four times per week (Adequate supply)  Hydralazine 25 mg one tablet 3 times daily  (Adequate supply- patient states she still has plenty on hand and will return my call when she is low)  Januvia 50 mg one tablet daily (Patient receives  assistance)  Aspirin 81 mg one tablet daily - OTC  Patient is due for next adherence delivery on: 06/29/2021. Called patient and reviewed medications and coordinated delivery.  This delivery to  include: Lisinopril 20 mg one tablet daily Montelukast 10 mg one tablet daily  Atorvastatin 20 mg one tablet daily Carvedilol 25 mg one tablet twice daily Gabapentin 100 mg one capsule 2 times daily Flonase 50 MCG/ACT nasal spray  Hydralazine 50 mg one tablet 3 times daily  Isosorbide dinitrate 20 mg in the morning and evening with Hydralazine 50 mg dose  Patient declined the following medications: Furosemide 40 mg one tablet four times per week (Adequate supply)  Januvia 50 mg one tablet daily (Patient receives  assistance)  Aspirin 81 mg one tablet daily - OTC  Albuterol 108 MCG/ACT inhaler- PRN (Adequate supply)   Patient needs refills for Gabapentin.  Confirmed delivery date of 06/29/2021 (Second route), advised patient that pharmacy will contact them the morning of delivery.  [place] Pharmacist Assistant [phone]

## 2021-06-18 DIAGNOSIS — H524 Presbyopia: Secondary | ICD-10-CM | POA: Diagnosis not present

## 2021-06-21 ENCOUNTER — Ambulatory Visit (HOSPITAL_COMMUNITY): Payer: No Typology Code available for payment source | Attending: Cardiology

## 2021-06-21 DIAGNOSIS — I110 Hypertensive heart disease with heart failure: Secondary | ICD-10-CM | POA: Diagnosis not present

## 2021-06-21 DIAGNOSIS — Z6841 Body Mass Index (BMI) 40.0 and over, adult: Secondary | ICD-10-CM | POA: Insufficient documentation

## 2021-06-21 DIAGNOSIS — I350 Nonrheumatic aortic (valve) stenosis: Secondary | ICD-10-CM | POA: Diagnosis not present

## 2021-06-21 DIAGNOSIS — R0609 Other forms of dyspnea: Secondary | ICD-10-CM

## 2021-06-21 DIAGNOSIS — G4733 Obstructive sleep apnea (adult) (pediatric): Secondary | ICD-10-CM | POA: Diagnosis not present

## 2021-06-21 DIAGNOSIS — E785 Hyperlipidemia, unspecified: Secondary | ICD-10-CM | POA: Insufficient documentation

## 2021-06-21 DIAGNOSIS — E1151 Type 2 diabetes mellitus with diabetic peripheral angiopathy without gangrene: Secondary | ICD-10-CM | POA: Insufficient documentation

## 2021-06-21 DIAGNOSIS — I5032 Chronic diastolic (congestive) heart failure: Secondary | ICD-10-CM | POA: Diagnosis not present

## 2021-06-21 DIAGNOSIS — E1169 Type 2 diabetes mellitus with other specified complication: Secondary | ICD-10-CM | POA: Diagnosis not present

## 2021-06-21 DIAGNOSIS — J849 Interstitial pulmonary disease, unspecified: Secondary | ICD-10-CM | POA: Diagnosis not present

## 2021-06-21 DIAGNOSIS — R0600 Dyspnea, unspecified: Secondary | ICD-10-CM | POA: Diagnosis not present

## 2021-06-21 DIAGNOSIS — N1832 Chronic kidney disease, stage 3b: Secondary | ICD-10-CM | POA: Diagnosis not present

## 2021-06-21 HISTORY — PX: OTHER SURGICAL HISTORY: SHX169

## 2021-06-21 NOTE — Progress Notes (Signed)
? ? ? ?Chief Complaint:  ? ?OBESITY ?Stacey Page is here to discuss her progress with her obesity treatment plan along with follow-up of her obesity related diagnoses. Stacey Page is on the Category 2 Plan and keeping a food journal and adhering to recommended goals of 400-500 calories and 35 grams of protein and states she is following her eating plan approximately 50% of the time. Stacey Page states she is chair exercising 15 minutes 2-3 times per week. ? ?Today's visit was #: 31 ?Starting weight: 344 lbs ?Starting date: 03/04/21 ?Today's weight: 345 lbs ?Today's date: 06/16/21 ?Total lbs lost to date: 0 ?Total lbs lost since last in-office visit: 0 ? ?Interim History:  ?Stacey Page denies cravings.  ?Meal timing is as follows:Breakfast 9 am, Lunch 2 pm and Dinner 6 pm.  ?She endorses low appetite in mornings.  ? ?Her last A1c at 6.4, at goal!  ? ?Note: Transfer of care to new PCP, 10/19/21 with Dr. Ethelene Hal. ? ?Subjective:  ? ?1. Hypertensive heart disease with chronic diastolic congestive heart failure ([place]) ?Stacey Page is on Coreg 25 mg twice a day, Lasix 40 mg every other day.  ?On 05/31/21 cardiology/ Dr. Ellyn Hack added Isordil 20 mg three times a day, for additional afterload reduction. ? ?2. Type 2 diabetes mellitus with other specified complication, without long-term current use of insulin ([place]) ?PCP from [place] started her on Januvia '25mg'$  QD - this has been the only antidiabetic medication she has ever been on. ?She denies family history of MTC or personal history of pancreatis.  ?She endorses an aversion to needles.  ? ?Assessment/Plan:  ? ?1. Hypertensive heart disease with chronic diastolic congestive heart failure ([place]) ?Cherye will continue cardiac medications per Dr. Harding/Cardiology. ? ?2. Type 2 diabetes mellitus with other specified complication, without long-term current use of insulin ([place]) ?We will consider GLP-1 therapy at next office visit. ?Continue Januvia '25mg'$  per PCP. ? ?3. Obesity, current BMI  55.7 ?Japji is currently in the action stage of change. As such, her goal is to continue with weight loss efforts. She has agreed to the Category 2 Plan.  ? ?Exercise goals: As is. ? ?Behavioral modification strategies: increasing lean protein intake, decreasing simple carbohydrates, meal planning and cooking strategies, keeping healthy foods in the home, and planning for success. ? ?Stacey Page has agreed to follow-up with our clinic in 4 weeks. She was informed of the importance of frequent follow-up visits to maximize her success with intensive lifestyle modifications for her multiple health conditions.  ? ?Objective:  ? ?Blood pressure 114/64, pulse 73, temperature 97.7 ?F (36.5 ?C), height '5\' 6"'$  (1.676 m), weight (!) 345 lb (156.5 kg), SpO2 97 %. ?Body mass index is 55.68 kg/m?. ? ?General: Cooperative, alert, well developed, in no acute distress. ?HEENT: Conjunctivae and lids unremarkable. ?Cardiovascular: Regular rhythm.  ?Lungs: Normal work of breathing. ?Neurologic: No focal deficits.  ? ?Lab Results  ?Component Value Date  ? CREATININE 1.76 (H) 04/13/2021  ? BUN 21 04/13/2021  ? NA 140 04/13/2021  ? K 4.1 04/13/2021  ? CL 104 04/13/2021  ? CO2 31 04/13/2021  ? ?Lab Results  ?Component Value Date  ? ALT 8 04/13/2021  ? AST 10 (L) 04/13/2021  ? ALKPHOS 67 04/13/2021  ? BILITOT 0.4 04/13/2021  ? ?Lab Results  ?Component Value Date  ? HGBA1C 6.4 (A) 04/14/2021  ? HGBA1C 6.1 (H) 10/14/2020  ? HGBA1C 6.1 05/14/2020  ? HGBA1C 6.0 (H) 10/28/2019  ? HGBA1C 5.9 (H) 07/04/2019  ? ?Lab Results  ?Component Value  Date  ? INSULIN 8.6 10/28/2019  ? INSULIN 9.2 07/04/2019  ? INSULIN 8.8 03/25/2019  ? ?Lab Results  ?Component Value Date  ? TSH 0.51 11/28/2018  ? ?Lab Results  ?Component Value Date  ? CHOL 116 05/14/2020  ? HDL 51.60 05/14/2020  ? [place] 52 05/14/2020  ? TRIG 61.0 05/14/2020  ? CHOLHDL 2 05/14/2020  ? ?Lab Results  ?Component Value Date  ? VD25OH 57.6 10/28/2019  ? VD25OH 41.5 07/04/2019  ? VD25OH 38.2  03/25/2019  ? ?Lab Results  ?Component Value Date  ? WBC 4.7 04/13/2021  ? HGB 11.9 (L) 04/13/2021  ? HCT 38.7 04/13/2021  ? MCV 91.9 04/13/2021  ? PLT 197 04/13/2021  ? ?Lab Results  ?Component Value Date  ? IRON 54 04/13/2021  ? TIBC 291 04/13/2021  ? FERRITIN 40 04/13/2021  ? ?Attestation Statements:  ? ?Reviewed by clinician on day of visit: allergies, medications, problem list, medical history, surgical history, family history, social history, and previous encounter notes. ? ?Time spent on visit including pre-visit chart review and post-visit care and charting was 28 minutes.  ? ?I, Brendell Tyus, RMA, am acting as transcriptionist for Mina Marble, NP. ? ?I have reviewed the above documentation for accuracy and completeness, and I agree with the above. -  Lolamae Voisin d. Aalani Aikens, NP-C ?

## 2021-06-21 NOTE — Telephone Encounter (Signed)
Closing note 

## 2021-06-22 ENCOUNTER — Telehealth: Payer: Self-pay

## 2021-06-22 MED ORDER — GABAPENTIN 100 MG PO CAPS: 100.0000 mg | ORAL_CAPSULE | Freq: Two times a day (BID) | ORAL | 1 refills | Status: DC

## 2021-06-22 NOTE — Addendum Note (Signed)
Addended by: Daron Offer A on: 06/22/2021 08:52 AM ? ? Modules accepted: Orders ? ?

## 2021-06-22 NOTE — Telephone Encounter (Signed)
Treating physician determined and CMN submitted ?

## 2021-06-25 LAB — COMPREHENSIVE METABOLIC PANEL
Albumin: 4.1 (ref 3.5–5.0)
Calcium: 9.4 (ref 8.7–10.7)
eGFR: 29

## 2021-06-25 LAB — BASIC METABOLIC PANEL
BUN: 23 — AB (ref 4–21)
Chloride: 102 (ref 99–108)
Creatinine: 1.9 — AB (ref <=1.1)
Glucose: 98
Potassium: 4.6 mEq/L (ref 3.5–5.1)
Sodium: 139 (ref 137–147)

## 2021-06-25 LAB — PROTEIN / CREATININE RATIO, URINE: Creatinine, Urine: 205.8

## 2021-06-25 LAB — CBC AND DIFFERENTIAL: Hemoglobin: 12.2 (ref 12.0–16.0)

## 2021-06-28 DIAGNOSIS — N2581 Secondary hyperparathyroidism of renal origin: Secondary | ICD-10-CM | POA: Diagnosis not present

## 2021-06-28 DIAGNOSIS — D631 Anemia in chronic kidney disease: Secondary | ICD-10-CM | POA: Diagnosis not present

## 2021-06-28 DIAGNOSIS — E1122 Type 2 diabetes mellitus with diabetic chronic kidney disease: Secondary | ICD-10-CM | POA: Diagnosis not present

## 2021-06-28 DIAGNOSIS — I5032 Chronic diastolic (congestive) heart failure: Secondary | ICD-10-CM | POA: Diagnosis not present

## 2021-06-28 DIAGNOSIS — I129 Hypertensive chronic kidney disease with stage 1 through stage 4 chronic kidney disease, or unspecified chronic kidney disease: Secondary | ICD-10-CM | POA: Diagnosis not present

## 2021-06-28 DIAGNOSIS — N184 Chronic kidney disease, stage 4 (severe): Secondary | ICD-10-CM | POA: Diagnosis not present

## 2021-06-30 ENCOUNTER — Ambulatory Visit (INDEPENDENT_AMBULATORY_CARE_PROVIDER_SITE_OTHER): Payer: No Typology Code available for payment source

## 2021-06-30 DIAGNOSIS — G63 Polyneuropathy in diseases classified elsewhere: Secondary | ICD-10-CM

## 2021-06-30 DIAGNOSIS — N184 Chronic kidney disease, stage 4 (severe): Secondary | ICD-10-CM

## 2021-06-30 DIAGNOSIS — I110 Hypertensive heart disease with heart failure: Secondary | ICD-10-CM

## 2021-06-30 DIAGNOSIS — Z0100 Encounter for examination of eyes and vision without abnormal findings: Secondary | ICD-10-CM | POA: Diagnosis not present

## 2021-06-30 NOTE — Patient Instructions (Signed)
Visit Information  Thank you for taking time to visit with me today. Please don't hesitate to contact me if I can be of assistance to you before our next scheduled telephone appointment.  Following are the goals we discussed today:  Continue to take medications as prescribed   Attend all scheduled provider appointments Call pharmacy for medication refills 3-7 days in advance of running out of medications Call provider office for new concerns or questions  weigh daily and record weights.  Notify your doctor if you gain 3 lbs overnight or 5 lbs in a week) Consider monitoring blood pressure at least 2-3 times per week Continue to follow a low salt diet.  Talk with provider regarding blood sugar monitor order Review fall precaution / safety education article sent to you in MyChart Talk with provider regarding need for  ambulatory device.   Our next appointment is by telephone on 08/11/21 at 10:00am   Please call the care guide team at [phone] if you need to cancel or reschedule your appointment.   If you are experiencing a Mental Health or [place] or need someone to talk to, please call the Suicide and Crisis Lifeline: 988 call 1-800-273-TALK (toll free, 24 hour hotline)   Patient verbalizes understanding of instructions and care plan provided today and agrees to view in [place]. Active MyChart status and patient understanding of how to access instructions and care plan via MyChart confirmed with patient.     Quinn Plowman RN,BSN,CCM RN Case Manager [place] [phone]

## 2021-06-30 NOTE — Chronic Care Management (AMB) (Signed)
Chronic Care Management   CCM RN Visit Note  06/30/2021 Name: Stacey Page MRN: [id number] DOB: 11-Jul-1950  Subjective: Stacey Page is a 71 y.o. year old female who is a primary care patient of [name], [name], NP. The care management team was consulted for assistance with disease management and care coordination needs.    Engaged with patient by telephone for follow up visit in response to provider referral for case management and/or care coordination services.   Consent to Services:  The patient was given information about Chronic Care Management services, agreed to services, and gave verbal consent prior to initiation of services.  Please see initial visit note for detailed documentation.   Patient agreed to services and verbal consent obtained.   Assessment: Review of patient past medical history, allergies, medications, health status, including review of consultants reports, laboratory and other test data, was performed as part of comprehensive evaluation and provision of chronic care management services.   SDOH (Social Determinants of Health) assessments and interventions performed:    CCM Care Plan  No Known Allergies  Outpatient Encounter Medications as of 06/30/2021  Medication Sig Note   acetaminophen (TYLENOL) 500 MG tablet Take 1 tablet (500 mg total) by mouth every 6 (six) hours as needed. 04/29/2021: Rarely uses   albuterol (PROAIR HFA) 108 (90 Base) MCG/ACT inhaler Inhale 1-2 puffs into the lungs every 6 (six) hours as needed for wheezing or shortness of breath.    aspirin 81 MG tablet Take 1 tablet (81 mg total) by mouth daily with breakfast.    atorvastatin (LIPITOR) 20 MG tablet TAKE 1 TABLET BY MOUTH  DAILY AT 6 PM.    carvedilol (COREG) 25 MG tablet Take 1 tablet (25 mg total) by mouth 2 (two) times daily.    cholecalciferol (VITAMIN D3) 25 MCG (1000 UNIT) tablet Take 2,000 Units by mouth daily.    Docusate Sodium 100 MG capsule Take 100 mg by mouth 2  (two) times daily.    ferrous sulfate 325 (65 FE) MG tablet Take 325 mg by mouth 2 (two) times daily with a meal.    fluticasone (FLONASE) 50 MCG/ACT nasal spray INSTILL 1 SPRAY IN EACH NOSTRIL DAILY    furosemide (LASIX) 40 MG tablet TAKE 1 TAB FOUR TIMES PER WEEK, TAKE AN ADDITIONAL TAB FOR WEIGHT GAIN OF 2 LBS OVERNIGHT OR 5 LBS IN ONE WEEK    gabapentin (NEURONTIN) 100 MG capsule Take 1 capsule (100 mg total) by mouth 2 (two) times daily.    hydrALAZINE (APRESOLINE) 50 MG tablet Take 1 tablet (50 mg total) by mouth 3 (three) times daily.    isosorbide dinitrate (ISORDIL) 20 MG tablet Take 1 tablet ( 20 mg total ) by mouth in morning and evening with Hydralazine 50 mg  dose    JANUVIA 25 MG tablet TAKE ONE TABLET BY MOUTH ONCE DAILY    lisinopril (ZESTRIL) 20 MG tablet TAKE 1 TABLET BY MOUTH ONCE DAILY WITH BREAKFAST    montelukast (SINGULAIR) 10 MG tablet Take 1 tablet (10 mg total) by mouth at bedtime.    No facility-administered encounter medications on file as of 06/30/2021.    Patient Active Problem List   Diagnosis Date Noted   Iron deficiency anemia 01/13/2021   CKD (chronic kidney disease) stage 4, GFR 15-29 ml/min ([place]) 07/09/2019   Diabetes mellitus type 2 in obese ([place]) 07/02/2019   Polyneuropathy associated with underlying disease ([place]) 11/28/2018   Severe episode of recurrent major depressive disorder, without psychotic features (  [place]) 11/28/2018   Type 2 diabetes mellitus with diabetic peripheral angiopathy without gangrene, without long-term current use of insulin ([place]) 11/28/2018   Poor short term memory 11/28/2018   Mild aortic stenosis by prior echocardiogram 11/06/2018   Diabetes mellitus ([place]) 03/21/2017   Symptomatic anemia 03/17/2016   S/P total knee arthroplasty, right 03/17/2016   Unilateral primary osteoarthritis, right knee    Hyperlipidemia associated with type 2 diabetes mellitus ([place]) 12/15/2015   Pain in both lower extremities 11/19/2015   Numbness in both  hands 08/13/2015   Panic attack 06/09/2015   Right knee pain 05/14/2015   Chronic renal insufficiency 09/16/2014   Pneumothorax on right    Atypical mycobacterium infection    ILD (interstitial lung disease) 2/2 MAI s/p med rxn    Class 3 severe obesity with serious comorbidity and body mass index (BMI) of 50.0 to 59.9 in adult ([place]) 04/03/2014   OSA (obstructive sleep apnea) 02/25/2014   Hypertensive heart disease with chronic diastolic congestive heart failure ([place]) 02/25/2014   DOE (dyspnea on exertion) 02/25/2014   Essential hypertension 02/25/2014    Conditions to be addressed/monitored:CHF, HTN, CKD Stage 3, and Neuropathy  Care Plan : [facility] plan of care  Updates made by Dannielle Karvonen, RN since 06/30/2021 12:00 AM     Problem: Chronic disease management education and care coordination needs.   Priority: High     Long-Range Goal: Development of plan of chronic care to address chronic disease management and care coodination needs.   Start Date: 03/24/2021  Expected End Date: 09/06/2021  Priority: High  Note:   Current Barriers:  Knowledge Deficits related to plan of care for management of CHF, HTN, and CKD Stage IV  Patient reports  having recent stress test and echocardiogram due to ongoing shortness of breath.  She reports her next follow up with the cardiologist is 08/06/21 to discuss test results. Patient states she will be transferring to a new primary care provider in September 2023.  Patient reports having a follow up visit with her nephrologist.  She stats her blood work had improved per discussion with provider and she will follow up in 3 months. Patient states her [place] medication was recently adjusted to 25 mg daily.   RNCM discussed with patient home management of health conditions.  Advised patient of importance of monitoring weight daily, blood pressure and blood sugars.  Patient states the neuropathy in her feet bother her.  Patient states, " I feel like I don't have  control of my feet at times."  Patient states she does not use any ambulatory devices.  She states she has asked her cardiologist to prescribe a walker.  She states she will discuss further with provider at her next appointment on 08/06/21.   S RNCM Clinical Goal(s):  Patient will verbalize understanding of plan for management of CHF, HTN, and CKD Stage IV as evidenced by patient self report and/ or notation in chart take all medications exactly as prescribed and will call provider for medication related questions as evidenced by patient self report and/  or notation in chart    attend all scheduled medical appointments:  as evidenced by patient self report and / or notation in chart        continue to work with RN Care Manager and/or Social Worker to address care management and care coordination needs related to CHF, HTN, and CKD Stage IV as evidenced by adherence to CM Team Scheduled appointments    through collaboration  with RN Care manager, provider, and care team.   Interventions: 1:1 collaboration with primary care provider regarding development and update of comprehensive plan of care as evidenced by provider attestation and co-signature Inter-disciplinary care team collaboration (see longitudinal plan of care) Evaluation of current treatment plan related to  self management and patient's adherence to plan as established by provider  Neuropathy Interventions:  ( Status: New Goal)  Long term goal  Discussed fall precautions / safety with patient Sent patient fall precautions/ safety education article in [place] patient to re- discuss need for ambulatory device with provider.   Heart Failure Interventions:  (Status:  Goal on track:  Yes.) Long Term Goal Discussed importance of following low sodium diet Reviewed Heart Failure Action Plan and zones Encouraged patient to weigh daily and record. Advised to notify her provider for weight gain of 3 lbs overnight or 5 lbs in a week.   Discussed the importance of keeping all appointments with provider Reviewed medications and discussed compliance.    Chronic Kidney Disease Interventions:  (Status:  Goal on track:  Yes.) Long Term Goal Evaluation of current treatment plan related to chronic kidney disease self management and patient's adherence to plan as established by provider      Reviewed medications with patient and discussed importance of compliance    Reviewed scheduled/upcoming provider appointments   Discussed plans with patient for ongoing care management follow up and provided patient with direct contact information for care management team    Last practice recorded BP readings:  BP Readings from Last 3 Encounters:  06/16/21 114/64  05/31/21 (!) 148/64  05/12/21 (!) 148/60  Most recent eGFR/CrCl:  Lab Results  Component Value Date   EGFR 33 (L) 10/14/2020    No components found for: CRCL   Hypertension Interventions:  (Status:  Goal on track:  Yes.) Long Term Goal Last practice recorded BP readings:  BP Readings from Last 3 Encounters:  06/16/21 114/64  05/31/21 (!) 148/64  05/12/21 (!) 148/60  Most recent eGFR/CrCl:  Lab Results  Component Value Date   EGFR 33 (L) 10/14/2020    No components found for: CRCL  Evaluation of current treatment plan related to hypertension self management and patient's adherence to plan as established by provider Reviewed medications with patient and discussed importance of compliance Discussed plans with patient for ongoing care management follow up and provided patient with direct contact information for care management team Reviewed scheduled/upcoming provider appointments  Advised patient to continue to follow low sodium diet.  Advised to monitor blood pressure at least 2-3 times per week   Patient Goals/Self-Care Activities: Continue to take medications as prescribed   Attend all scheduled provider appointments Call pharmacy for medication refills 3-7 days in  advance of running out of medications Call provider office for new concerns or questions  weigh daily and record weights.  Notify your doctor if you gain 3 lbs overnight or 5 lbs in a week) Consider monitoring blood pressure at least 2-3 times per week Continue to follow a low salt diet.  Talk with provider regarding blood sugar monitor order Review fall precaution / safety education article sent to you in MyChart Talk with provider regarding need for  ambulatory device.        Plan:The patient has been provided with contact information for the care management team and has been advised to call with any health related questions or concerns.  The care management team will reach out to the patient again  over the next 45 days. Quinn Plowman RN,BSN,CCM RN Case Manager [place] [phone]

## 2021-07-01 ENCOUNTER — Encounter: Payer: Self-pay | Admitting: Sports Medicine

## 2021-07-01 ENCOUNTER — Ambulatory Visit (INDEPENDENT_AMBULATORY_CARE_PROVIDER_SITE_OTHER): Payer: No Typology Code available for payment source | Admitting: Sports Medicine

## 2021-07-01 ENCOUNTER — Telehealth: Payer: Self-pay

## 2021-07-01 DIAGNOSIS — M79675 Pain in left toe(s): Secondary | ICD-10-CM

## 2021-07-01 DIAGNOSIS — E1142 Type 2 diabetes mellitus with diabetic polyneuropathy: Secondary | ICD-10-CM

## 2021-07-01 DIAGNOSIS — M79674 Pain in right toe(s): Secondary | ICD-10-CM | POA: Diagnosis not present

## 2021-07-01 DIAGNOSIS — B351 Tinea unguium: Secondary | ICD-10-CM | POA: Diagnosis not present

## 2021-07-01 DIAGNOSIS — M2141 Flat foot [pes planus] (acquired), right foot: Secondary | ICD-10-CM

## 2021-07-01 DIAGNOSIS — M2142 Flat foot [pes planus] (acquired), left foot: Secondary | ICD-10-CM

## 2021-07-01 DIAGNOSIS — M2042 Other hammer toe(s) (acquired), left foot: Secondary | ICD-10-CM

## 2021-07-01 DIAGNOSIS — M2041 Other hammer toe(s) (acquired), right foot: Secondary | ICD-10-CM

## 2021-07-01 NOTE — Progress Notes (Signed)
Subjective: Stacey Page is a 71 y.o. female patient with history of diabetes who presents to office today complaining of long,mildly painful nails and painful callus while ambulating in shoes; unable to trim. States that she is still having trouble with her shoes.  Fasting blood sugar not recorded. A1c 6. Flossie Buffy, NP  Patient Active Problem List   Diagnosis Date Noted   Iron deficiency anemia 01/13/2021   CKD (chronic kidney disease) stage 4, GFR 15-29 ml/min ([place]) 07/09/2019   Diabetes mellitus type 2 in obese ([place]) 07/02/2019   Polyneuropathy associated with underlying disease ([place]) 11/28/2018   Severe episode of recurrent major depressive disorder, without psychotic features ([place]) 11/28/2018   Type 2 diabetes mellitus with diabetic peripheral angiopathy without gangrene, without long-term current use of insulin ([place]) 11/28/2018   Poor short term memory 11/28/2018   Mild aortic stenosis by prior echocardiogram 11/06/2018   Diabetes mellitus ([place]) 03/21/2017   Symptomatic anemia 03/17/2016   S/P total knee arthroplasty, right 03/17/2016   Unilateral primary osteoarthritis, right knee    Hyperlipidemia associated with type 2 diabetes mellitus ([place]) 12/15/2015   Pain in both lower extremities 11/19/2015   Numbness in both hands 08/13/2015   Panic attack 06/09/2015   Right knee pain 05/14/2015   Chronic renal insufficiency 09/16/2014   Pneumothorax on right    Atypical mycobacterium infection    ILD (interstitial lung disease) 2/2 MAI s/p med rxn    Class 3 severe obesity with serious comorbidity and body mass index (BMI) of 50.0 to 59.9 in adult ([place]) 04/03/2014   OSA (obstructive sleep apnea) 02/25/2014   Hypertensive heart disease with chronic diastolic congestive heart failure ([place]) 02/25/2014   DOE (dyspnea on exertion) 02/25/2014   Essential hypertension 02/25/2014   Current Outpatient Medications on File Prior to Visit  Medication Sig Dispense Refill    acetaminophen (TYLENOL) 500 MG tablet Take 1 tablet (500 mg total) by mouth every 6 (six) hours as needed. 30 tablet 0   albuterol (PROAIR HFA) 108 (90 Base) MCG/ACT inhaler Inhale 1-2 puffs into the lungs every 6 (six) hours as needed for wheezing or shortness of breath. 1 each 3   aspirin 81 MG tablet Take 1 tablet (81 mg total) by mouth daily with breakfast. 90 tablet 0   atorvastatin (LIPITOR) 20 MG tablet TAKE 1 TABLET BY MOUTH  DAILY AT 6 PM. 90 tablet 3   carvedilol (COREG) 25 MG tablet Take 1 tablet (25 mg total) by mouth 2 (two) times daily. 180 tablet 3   cholecalciferol (VITAMIN D3) 25 MCG (1000 UNIT) tablet Take 2,000 Units by mouth daily.     Docusate Sodium 100 MG capsule Take 100 mg by mouth 2 (two) times daily.     ferrous sulfate 325 (65 FE) MG tablet Take 325 mg by mouth 2 (two) times daily with a meal.     fluticasone (FLONASE) 50 MCG/ACT nasal spray INSTILL 1 SPRAY IN EACH NOSTRIL DAILY 16 g 3   furosemide (LASIX) 40 MG tablet TAKE 1 TAB FOUR TIMES PER WEEK, TAKE AN ADDITIONAL TAB FOR WEIGHT GAIN OF 2 LBS OVERNIGHT OR 5 LBS IN ONE WEEK 180 tablet 3   gabapentin (NEURONTIN) 100 MG capsule Take 1 capsule (100 mg total) by mouth 2 (two) times daily. 180 capsule 1   hydrALAZINE (APRESOLINE) 50 MG tablet Take 1 tablet (50 mg total) by mouth 3 (three) times daily. 270 tablet 3   isosorbide dinitrate (ISORDIL) 20 MG tablet Take 1 tablet (  20 mg total ) by mouth in morning and evening with Hydralazine 50 mg  dose 180 tablet 3   JANUVIA 25 MG tablet TAKE ONE TABLET BY MOUTH ONCE DAILY 30 tablet 0   lisinopril (ZESTRIL) 20 MG tablet TAKE 1 TABLET BY MOUTH ONCE DAILY WITH BREAKFAST 90 tablet 3   montelukast (SINGULAIR) 10 MG tablet Take 1 tablet (10 mg total) by mouth at bedtime. 90 tablet 3   No current facility-administered medications on file prior to visit.   No Known Allergies  Recent Results (from the past 2160 hour(s))  CBC with Differential ([facility] Only)     Status:  Abnormal   Collection Time: 04/13/21  9:34 AM  Result Value Ref Range   WBC Count 4.7 4.0 - 10.5 K/uL   RBC 4.21 3.87 - 5.11 MIL/uL   Hemoglobin 11.9 (L) 12.0 - 15.0 g/dL   HCT 38.7 36.0 - 46.0 %   MCV 91.9 80.0 - 100.0 fL   MCH 28.3 26.0 - 34.0 pg   MCHC 30.7 30.0 - 36.0 g/dL   RDW 15.3 11.5 - 15.5 %   Platelet Count 197 150 - 400 K/uL   nRBC 0.0 0.0 - 0.2 %   Neutrophils Relative % 66 %   Neutro Abs 3.1 1.7 - 7.7 K/uL   Lymphocytes Relative 22 %   Lymphs Abs 1.0 0.7 - 4.0 K/uL   Monocytes Relative 8 %   Monocytes Absolute 0.4 0.1 - 1.0 K/uL   Eosinophils Relative 3 %   Eosinophils Absolute 0.1 0.0 - 0.5 K/uL   Basophils Relative 1 %   Basophils Absolute 0.0 0.0 - 0.1 K/uL   Immature Granulocytes 0 %   Abs Immature Granulocytes 0.01 0.00 - 0.07 K/uL    Comment: Performed at [facility], [address] [place], [place] [address]  Iron and Iron Binding Capacity ([facility] only)     Status: None   Collection Time: 04/13/21  9:34 AM  Result Value Ref Range   Iron 54 28 - 170 ug/dL   TIBC 291 250 - 450 ug/dL   Saturation Ratios 19 10.4 - 31.8 %   UIBC 237 148 - 442 ug/dL    Comment: Performed at [facility], [place] [address] [place], [place] [address]  Ferritin     Status: None   Collection Time: 04/13/21  9:34 AM  Result Value Ref Range   Ferritin 40 11 - 307 ng/mL    Comment: Performed at [facility], [place] [address], [place], [place] [address]  Reticulocytes     Status: None   Collection Time: 04/13/21  9:34 AM  Result Value Ref Range   Retic Ct Pct 1.1 0.4 - 3.1 %   RBC. 4.11 3.87 - 5.11 MIL/uL   Retic Count, Absolute 44.8 19.0 - 186.0 K/uL   Immature Retic Fract 14.9 2.3 - 15.9 %    Comment: Performed at [facility], [place] [address] [place], [place] [address]  CMP ([place] only)     Status: Abnormal   Collection Time: 04/13/21  9:34 AM  Result Value Ref Range    Sodium 140 135 - 145 mmol/L   Potassium 4.1 3.5 - 5.1 mmol/L   Chloride 104 98 - 111 mmol/L   CO2 31 22 - 32 mmol/L   Glucose, Bld 112 (H) 70 - 99 mg/dL    Comment: Glucose reference range applies only to samples taken after  fasting for at least 8 hours.   BUN 21 8 - 23 mg/dL   Creatinine 1.76 (H) 0.44 - 1.00 mg/dL   Calcium 9.5 8.9 - 10.3 mg/dL   Total Protein 7.6 6.5 - 8.1 g/dL   Albumin 3.8 3.5 - 5.0 g/dL   AST 10 (L) 15 - 41 U/L   ALT 8 0 - 44 U/L   Alkaline Phosphatase 67 38 - 126 U/L   Total Bilirubin 0.4 0.3 - 1.2 mg/dL   GFR, Estimated 31 (L) >60 mL/min    Comment: (NOTE) Calculated using the CKD-EPI Creatinine Equation (2021)    Anion gap 5 5 - 15    Comment: Performed at [facility], [place] [address], [place], [place] [address]  POCT glycosylated hemoglobin (Hb A1C)     Status: Abnormal   Collection Time: 04/14/21  8:56 AM  Result Value Ref Range   Hemoglobin A1C 6.4 (A) 4.0 - 5.6 %  ECHOCARDIOGRAM COMPLETE     Status: None   Collection Time: 06/14/21  8:26 AM  Result Value Ref Range   Area-P 1/2 3.17 cm2   S' Lateral 3.00 cm   AV Area mean vel 1.62 cm2   AR max vel 1.67 cm2   AV Area VTI 1.73 cm2   Ao pk vel 2.58 m/s   AV Mean grad 18.0 mmHg   AV Peak grad 26.5 mmHg    Objective: General: Patient is awake, alert, and oriented x 3 and in no acute distress.  Integument: Skin is warm, dry and supple bilateral. Nails are tender, long, thickened and dystrophic with subungual debris, consistent with onychomycosis, 1-5 bilateral.  There is mild curvature of bilateral hallux nails with no acute signs of infection.  Dry skin to heels and medial first MPJ on the right foot.  Vasculature:  Dorsalis Pedis pulse 1/4 bilateral. Posterior Tibial pulse 1/4 bilateral. Capillary fill time <3 sec 1-5 bilateral. Positive hair growth to the level of the digits.Temperature gradient within normal limits. No varicosities present bilateral. No edema present  bilateral.   Neurology: Gross sensation present via light touch bilateral.  Musculoskeletal: Asymptomatic pes planus and lesser hammertoe pedal deformities noted bilateral. Muscular strength 5/5 in all lower extremity muscular groups bilateral without pain on range of motion . No tenderness with calf compression bilateral.  Assessment and Plan: Problem List Items Addressed This Visit   None Visit Diagnoses     Pain due to onychomycosis of toenails of both feet    -  Primary   Diabetic peripheral neuropathy associated with type 2 diabetes mellitus (HCC)       Acquired hammertoes of both feet       Pes planus of both feet           -Examined patient -Re-Discussed and educated patient on diabetic foot care -Mechanically debrided all painful nails 1-5 bilateral using sterile nail nipper and filed with dremel without incident  -Patient is awaiting diabetic shoes; will see Aaron Edelman today regarding ?s on coverage  -Answered all patient questions -Patient to return  in 2.5 months for at risk foot care or sooner if issues or problems arise meanwhile.  Landis Martins, DPM

## 2021-07-01 NOTE — Telephone Encounter (Signed)
Patient seen in the office following Dr. Leeanne Rio appointment. Patient reports her medicare replacement plan [employer] insists that [facility] will provide Diabetic Shoes at 100% coverage. Explained to patient that it is Environmental manager that they do not provide diabetic shoes or insoles. Called to confirm with the local [facility]. Patient insisted they wanted to try anyway and cancelled their order with [name] because I could not guarantee 100% coverage. Patient reports that if they discover differently than what they understand their insurance to have led them to believe in person, she will return to re-start the order.

## 2021-07-07 DIAGNOSIS — N184 Chronic kidney disease, stage 4 (severe): Secondary | ICD-10-CM | POA: Diagnosis not present

## 2021-07-07 DIAGNOSIS — I130 Hypertensive heart and chronic kidney disease with heart failure and stage 1 through stage 4 chronic kidney disease, or unspecified chronic kidney disease: Secondary | ICD-10-CM

## 2021-07-07 DIAGNOSIS — I509 Heart failure, unspecified: Secondary | ICD-10-CM | POA: Diagnosis not present

## 2021-07-07 DIAGNOSIS — Z87891 Personal history of nicotine dependence: Secondary | ICD-10-CM | POA: Diagnosis not present

## 2021-07-09 ENCOUNTER — Telehealth: Payer: Self-pay | Admitting: Nurse Practitioner

## 2021-07-09 DIAGNOSIS — G63 Polyneuropathy in diseases classified elsewhere: Secondary | ICD-10-CM

## 2021-07-09 DIAGNOSIS — G4733 Obstructive sleep apnea (adult) (pediatric): Secondary | ICD-10-CM

## 2021-07-09 NOTE — Telephone Encounter (Signed)
Pts insurance called and stated the pt needs her cpap supplies and diabetic shoes ordered. Please fax this order to [employer] at [phone]. Ph # [phone] Please contact pt when order is complete

## 2021-07-12 NOTE — Telephone Encounter (Signed)
Pt advised.

## 2021-07-14 ENCOUNTER — Inpatient Hospital Stay: Payer: No Typology Code available for payment source | Attending: Adult Health

## 2021-07-14 ENCOUNTER — Encounter: Payer: Self-pay | Admitting: Family Medicine

## 2021-07-14 ENCOUNTER — Other Ambulatory Visit: Payer: Self-pay

## 2021-07-14 ENCOUNTER — Ambulatory Visit (INDEPENDENT_AMBULATORY_CARE_PROVIDER_SITE_OTHER): Payer: No Typology Code available for payment source | Admitting: Family Medicine

## 2021-07-14 VITALS — BP 152/70 | HR 58 | Temp 97.8°F | Ht 66.0 in | Wt 350.0 lb

## 2021-07-14 DIAGNOSIS — I5032 Chronic diastolic (congestive) heart failure: Secondary | ICD-10-CM

## 2021-07-14 DIAGNOSIS — E1169 Type 2 diabetes mellitus with other specified complication: Secondary | ICD-10-CM

## 2021-07-14 DIAGNOSIS — Z23 Encounter for immunization: Secondary | ICD-10-CM | POA: Diagnosis not present

## 2021-07-14 DIAGNOSIS — D509 Iron deficiency anemia, unspecified: Secondary | ICD-10-CM | POA: Diagnosis not present

## 2021-07-14 DIAGNOSIS — N184 Chronic kidney disease, stage 4 (severe): Secondary | ICD-10-CM

## 2021-07-14 DIAGNOSIS — I110 Hypertensive heart disease with heart failure: Secondary | ICD-10-CM | POA: Diagnosis not present

## 2021-07-14 LAB — CMP (CANCER CENTER ONLY)
ALT: 7 U/L (ref 0–44)
AST: 10 U/L — ABNORMAL LOW (ref 15–41)
Albumin: 3.9 g/dL (ref 3.5–5.0)
Alkaline Phosphatase: 65 U/L (ref 38–126)
Anion gap: 4 — ABNORMAL LOW (ref 5–15)
BUN: 27 mg/dL — ABNORMAL HIGH (ref 8–23)
CO2: 32 mmol/L (ref 22–32)
Calcium: 9.9 mg/dL (ref 8.9–10.3)
Chloride: 106 mmol/L (ref 98–111)
Creatinine: 1.87 mg/dL — ABNORMAL HIGH (ref 0.44–1.00)
GFR, Estimated: 29 mL/min — ABNORMAL LOW (ref >60)
Glucose, Bld: 107 mg/dL — ABNORMAL HIGH (ref 70–99)
Potassium: 4.3 mmol/L (ref 3.5–5.1)
Sodium: 142 mmol/L (ref 135–145)
Total Bilirubin: 0.3 mg/dL (ref 0.3–1.2)
Total Protein: 7.6 g/dL (ref 6.5–8.1)

## 2021-07-14 LAB — CBC WITH DIFFERENTIAL (CANCER CENTER ONLY)
Abs Immature Granulocytes: 0.01 10*3/uL (ref 0.00–0.07)
Basophils Absolute: 0 10*3/uL (ref 0.0–0.1)
Basophils Relative: 0 %
Eosinophils Absolute: 0.2 10*3/uL (ref 0.0–0.5)
Eosinophils Relative: 3 %
HCT: 37.9 % (ref 36.0–46.0)
Hemoglobin: 11.6 g/dL — ABNORMAL LOW (ref 12.0–15.0)
Immature Granulocytes: 0 %
Lymphocytes Relative: 24 %
Lymphs Abs: 1.2 10*3/uL (ref 0.7–4.0)
MCH: 29.2 pg (ref 26.0–34.0)
MCHC: 30.6 g/dL (ref 30.0–36.0)
MCV: 95.5 fL (ref 80.0–100.0)
Monocytes Absolute: 0.3 10*3/uL (ref 0.1–1.0)
Monocytes Relative: 7 %
Neutro Abs: 3.1 10*3/uL (ref 1.7–7.7)
Neutrophils Relative %: 66 %
Platelet Count: 205 10*3/uL (ref 150–400)
RBC: 3.97 MIL/uL (ref 3.87–5.11)
RDW: 14.8 % (ref 11.5–15.5)
WBC Count: 4.8 10*3/uL (ref 4.0–10.5)
nRBC: 0 % (ref 0.0–0.2)

## 2021-07-14 LAB — POCT GLYCOSYLATED HEMOGLOBIN (HGB A1C): Hemoglobin A1C: 6 % — AB (ref 4.0–5.6)

## 2021-07-14 LAB — FERRITIN: Ferritin: 31 ng/mL (ref 11–307)

## 2021-07-14 LAB — RETICULOCYTES
Immature Retic Fract: 17.2 % — ABNORMAL HIGH (ref 2.3–15.9)
RBC.: 3.95 MIL/uL (ref 3.87–5.11)
Retic Count, Absolute: 56.5 10*3/uL (ref 19.0–186.0)
Retic Ct Pct: 1.4 % (ref 0.4–3.1)

## 2021-07-14 NOTE — Progress Notes (Signed)
Established Patient Office Visit  Subjective   Patient ID: Stacey Page, female    DOB: 11/14/1950  Age: 71 y.o. MRN: [id number]  Chief Complaint  Patient presents with   Transitions Of Care    TOC, no concerns. Patient will have labs at at a different location today    HPI for transfer of care and follow-up of type 2 diabetes controlled with Januvia.  History of hypertensive heart disease with chronic diastolic heart failure.  Recent echocardiogram revealed normal left ventricular function with an EF of 65%.  Hypertension is followed by cardiology.  Blood pressures at home have been running in the 140s to 160/60-70 range.  She has follow-up scheduled with cardiology at the end of the month.  Ongoing follow-up with nephrology for CKD and iron deficiency.  She needs Shingrix vaccine today.      Review of Systems  Constitutional:  Negative for chills, diaphoresis, malaise/fatigue and weight loss.  HENT: Negative.    Eyes: Negative.  Negative for blurred vision and double vision.  Cardiovascular:  Negative for chest pain.  Gastrointestinal:  Negative for abdominal pain.  Genitourinary: Negative.   Musculoskeletal:  Negative for falls and myalgias.  Neurological:  Negative for speech change, loss of consciousness and weakness.  Psychiatric/Behavioral: Negative.       Objective:     BP (!) 152/70 (BP Location: Right Arm, Patient Position: Sitting, Cuff Size: Large)   Pulse (!) 58   Temp 97.8 F (36.6 C) (Temporal)   Ht '5\' 6"'$  (1.676 m)   Wt (!) 350 lb (158.8 kg)   SpO2 98%   BMI 56.49 kg/m    Physical Exam Constitutional:      General: She is not in acute distress.    Appearance: Normal appearance. She is not ill-appearing, toxic-appearing or diaphoretic.  HENT:     Head: Normocephalic and atraumatic.     Right Ear: Tympanic membrane, ear canal and external ear normal.     Left Ear: Tympanic membrane, ear canal and external ear normal.     Mouth/Throat:     Mouth:  Mucous membranes are moist.     Pharynx: Oropharynx is clear. No oropharyngeal exudate or posterior oropharyngeal erythema.  Eyes:     General: No scleral icterus.       Right eye: No discharge.        Left eye: No discharge.     Extraocular Movements: Extraocular movements intact.     Conjunctiva/sclera: Conjunctivae normal.     Pupils: Pupils are equal, round, and reactive to light.  Cardiovascular:     Rate and Rhythm: Normal rate and regular rhythm.     Pulses:          Dorsalis pedis pulses are 2+ on the right side and 2+ on the left side.       Posterior tibial pulses are 1+ on the right side and 1+ on the left side.  Pulmonary:     Effort: Pulmonary effort is normal. No respiratory distress.     Breath sounds: Normal breath sounds.  Abdominal:     General: Bowel sounds are normal.  Musculoskeletal:     Cervical back: No rigidity or tenderness.  Skin:    General: Skin is warm and dry.  Neurological:     Mental Status: She is alert and oriented to person, place, and time.  Psychiatric:        Mood and Affect: Mood normal.        Behavior:  Behavior normal.   Diabetic Foot Exam - Simple   Simple Foot Form Diabetic Foot exam was performed with the following findings: Yes 07/14/2021  9:12 AM  Visual Inspection No deformities, no ulcerations, no other skin breakdown bilaterally: Yes Sensation Testing Intact to touch and monofilament testing bilaterally: Yes Pulse Check Posterior Tibialis and Dorsalis pulse intact bilaterally: Yes Comments      No results found for any visits on 07/14/21.    The ASCVD Risk score (Arnett DK, et al., 2019) failed to calculate for the following reasons:   The valid total cholesterol range is 130 to 320 mg/dL    Assessment & Plan:   Problem List Items Addressed This Visit       Cardiovascular and Mediastinum   Hypertensive heart disease with chronic diastolic congestive heart failure (HCC) (Chronic)     Endocrine   Diabetes  mellitus ([place])   Relevant Orders   POCT HgB A1C     Genitourinary   CKD (chronic kidney disease) stage 4, GFR 15-29 ml/min (HCC)     Other   Need for shingles vaccine - Primary   Relevant Orders   Varicella-zoster vaccine IM    Return in about 6 months (around 01/13/2022).  Continue follow-up with cardiology for management of hypertension.  Continue follow-up with nephrology for management of CKD and iron deficiency.  [name], MD

## 2021-07-15 DIAGNOSIS — G4733 Obstructive sleep apnea (adult) (pediatric): Secondary | ICD-10-CM | POA: Diagnosis not present

## 2021-07-19 ENCOUNTER — Ambulatory Visit (INDEPENDENT_AMBULATORY_CARE_PROVIDER_SITE_OTHER): Payer: No Typology Code available for payment source | Admitting: Adult Health

## 2021-07-19 ENCOUNTER — Encounter (INDEPENDENT_AMBULATORY_CARE_PROVIDER_SITE_OTHER): Payer: Self-pay | Admitting: Adult Health

## 2021-07-19 VITALS — BP 144/67 | HR 69 | Temp 97.8°F | Ht 66.0 in | Wt 340.0 lb

## 2021-07-19 DIAGNOSIS — Z7984 Long term (current) use of oral hypoglycemic drugs: Secondary | ICD-10-CM | POA: Diagnosis not present

## 2021-07-19 DIAGNOSIS — G4733 Obstructive sleep apnea (adult) (pediatric): Secondary | ICD-10-CM | POA: Diagnosis not present

## 2021-07-19 DIAGNOSIS — Z6841 Body Mass Index (BMI) 40.0 and over, adult: Secondary | ICD-10-CM

## 2021-07-19 DIAGNOSIS — I152 Hypertension secondary to endocrine disorders: Secondary | ICD-10-CM | POA: Diagnosis not present

## 2021-07-19 DIAGNOSIS — E1159 Type 2 diabetes mellitus with other circulatory complications: Secondary | ICD-10-CM | POA: Diagnosis not present

## 2021-07-19 DIAGNOSIS — E669 Obesity, unspecified: Secondary | ICD-10-CM | POA: Diagnosis not present

## 2021-07-19 DIAGNOSIS — E1169 Type 2 diabetes mellitus with other specified complication: Secondary | ICD-10-CM

## 2021-07-20 NOTE — Progress Notes (Unsigned)
Chief Complaint:   OBESITY Stacey Page is here to discuss her progress with her obesity treatment plan along with follow-up of her obesity related diagnoses. Stacey Page is on the Category 2 Plan and states she is following her eating plan approximately 50% of the time. Stacey Page states she is doing chair exercise, and walking for 15 minutes 2-3 times per week.  Today's visit was #: 60 Starting weight: 344 lbs Starting date: 03/04/2021 Today's weight: 340 lbs Today's date: 07/19/2021 Total lbs lost to date: 4 Total lbs lost since last in-office visit: 5  Interim History: Stacey Page has stopped late-night eating and decrease overall carbohydrate intake.  On 07/14/2020 she established with a new PCP, no change in medications.  Subjective:   1. Type 2 diabetes mellitus with other specified complication, without long-term current use of insulin ([place]) On 07/14/2021 PCP checked A1c, A1c was 6.0, at goal.  She is only on Januvia 25 mg daily.  She is not checking her blood sugars at home.  She denies symptoms of hypoglycemia.  2. Hypertension associated with type 2 diabetes mellitus ([place]) Epic reviewed-systolic blood pressure 517-616, primarily 140.  Diastolic blood pressure 07-37, primarily 70s.***.  No acute cardiac symptoms.  She is not checking her blood pressure or heart rate at home.  Assessment/Plan:   1. Type 2 diabetes mellitus with other specified complication, without long-term current use of insulin (HCC) Stacey Page will continue daily Januvia 25 mg.  2. Hypertension associated with type 2 diabetes mellitus ([place]) Stacey Page is to keep her cardiology appointment with Dr. Ellyn Hack follow-up on 06/06/2020.  3. Obesity, current BMI 55.0 Stacey Page is currently in the action stage of change. As such, her goal is to continue with weight loss efforts. She has agreed to the Category 2 Plan.   Exercise goals: As is.  Behavioral modification strategies: increasing lean protein intake,  decreasing simple carbohydrates, meal planning and cooking strategies, keeping healthy foods in the home, and planning for success.  Stacey Page has agreed to follow-up with our clinic in 4 weeks. She was informed of the importance of frequent follow-up visits to maximize her success with intensive lifestyle modifications for her multiple health conditions.   Objective:   Blood pressure (!) 144/67, pulse 69, temperature 97.8 F (36.6 C), height '5\' 6"'$  (1.676 m), weight (!) 340 lb (154.2 kg), SpO2 100 %. Body mass index is 54.88 kg/m.  General: Cooperative, alert, well developed, in no acute distress. HEENT: Conjunctivae and lids unremarkable. Cardiovascular: Regular rhythm.  Lungs: Normal work of breathing. Neurologic: No focal deficits.   Lab Results  Component Value Date   CREATININE 1.87 (H) 07/14/2021   BUN 27 (H) 07/14/2021   NA 142 07/14/2021   K 4.3 07/14/2021   CL 106 07/14/2021   CO2 32 07/14/2021   Lab Results  Component Value Date   ALT 7 07/14/2021   AST 10 (L) 07/14/2021   ALKPHOS 65 07/14/2021   BILITOT 0.3 07/14/2021   Lab Results  Component Value Date   HGBA1C 6.0 (A) 07/14/2021   HGBA1C 6.4 (A) 04/14/2021   HGBA1C 6.1 (H) 10/14/2020   HGBA1C 6.1 05/14/2020   HGBA1C 6.0 (H) 10/28/2019   Lab Results  Component Value Date   INSULIN 8.6 10/28/2019   INSULIN 9.2 07/04/2019   INSULIN 8.8 03/25/2019   Lab Results  Component Value Date   TSH 0.51 11/28/2018   Lab Results  Component Value Date   CHOL 116 05/14/2020   HDL 51.60 05/14/2020   [place]  52 05/14/2020   TRIG 61.0 05/14/2020   CHOLHDL 2 05/14/2020   Lab Results  Component Value Date   VD25OH 57.6 10/28/2019   VD25OH 41.5 07/04/2019   VD25OH 38.2 03/25/2019   Lab Results  Component Value Date   WBC 4.8 07/14/2021   HGB 11.6 (L) 07/14/2021   HCT 37.9 07/14/2021   MCV 95.5 07/14/2021   PLT 205 07/14/2021   Lab Results  Component Value Date   IRON 54 04/13/2021   TIBC 291  04/13/2021   FERRITIN 31 07/14/2021   Attestation Statements:   Reviewed by clinician on day of visit: allergies, medications, problem list, medical history, surgical history, family history, social history, and previous encounter notes.  Time spent on visit including pre-visit chart review and post-visit care and charting was 28 minutes.   Wilhemena Durie, am acting as transcriptionist for Mina Marble, NP.  I have reviewed the above documentation for accuracy and completeness, and I agree with the above. -  ***

## 2021-07-21 ENCOUNTER — Telehealth: Payer: Self-pay | Admitting: Family Medicine

## 2021-07-21 NOTE — Telephone Encounter (Signed)
Pt called regarding her FMLA paperwork. Please call her to let her know the status.

## 2021-07-22 ENCOUNTER — Telehealth: Payer: Self-pay

## 2021-07-22 ENCOUNTER — Other Ambulatory Visit: Payer: Self-pay

## 2021-07-22 ENCOUNTER — Ambulatory Visit (INDEPENDENT_AMBULATORY_CARE_PROVIDER_SITE_OTHER): Payer: No Typology Code available for payment source | Admitting: Family Medicine

## 2021-07-22 ENCOUNTER — Encounter: Payer: Self-pay | Admitting: Family Medicine

## 2021-07-22 VITALS — BP 179/80 | HR 57 | Temp 98.3°F | Ht 66.0 in | Wt 349.6 lb

## 2021-07-22 DIAGNOSIS — E1151 Type 2 diabetes mellitus with diabetic peripheral angiopathy without gangrene: Secondary | ICD-10-CM | POA: Diagnosis not present

## 2021-07-22 DIAGNOSIS — E1169 Type 2 diabetes mellitus with other specified complication: Secondary | ICD-10-CM

## 2021-07-22 DIAGNOSIS — G63 Polyneuropathy in diseases classified elsewhere: Secondary | ICD-10-CM

## 2021-07-22 MED ORDER — ONETOUCH VERIO VI STRP: 1.0000 | ORAL_STRIP | 3 refills | Status: DC | PRN

## 2021-07-22 MED ORDER — FREESTYLE LIBRE SENSOR SYSTEM MISC: 1.0000 | Freq: Every day | 0 refills | Status: DC

## 2021-07-22 MED ORDER — ONETOUCH VERIO SYNC SYSTEM W/DEVICE KIT: 1.0000 | PACK | Freq: Every day | 0 refills | Status: AC | PRN

## 2021-07-22 NOTE — Telephone Encounter (Signed)
Called pt regarding recent lab work is stable.  Informed pt of next lab appt.  Pt verbalized understanding.

## 2021-07-22 NOTE — Progress Notes (Signed)
Established Patient Office Visit  Subjective   Patient ID: Stacey Page, female    DOB: 1950-05-28  Age: 71 y.o. MRN: [id number]  Chief Complaint  Patient presents with   Acute Visit    Pt word like a prescription for Free style Libre 14 day meter and 2 sensors per month also for the One touch meter Verio Flex Lacets, and test strips  Wants to discuss paper work about diabetic shoes     HPI needs prescription for One Touch glucometer and the freestyle libre continuous glucose monitor system.  Also needs diabetic shoes.  Reports ongoing burning paresthesias in her feet.    Review of Systems  Constitutional:  Negative for chills, diaphoresis, malaise/fatigue and weight loss.  HENT: Negative.    Eyes: Negative.  Negative for blurred vision and double vision.  Cardiovascular:  Negative for chest pain.  Gastrointestinal:  Negative for abdominal pain.  Genitourinary: Negative.   Musculoskeletal:  Negative for falls and myalgias.  Neurological:  Negative for speech change, loss of consciousness and weakness.  Psychiatric/Behavioral: Negative.        Objective:     BP (!) 179/80 (BP Location: Right Arm, Patient Position: Sitting, Cuff Size: Large)   Pulse (!) 57   Temp 98.3 F (36.8 C) (Temporal)   Ht '5\' 6"'  (1.676 m)   Wt (!) 349 lb 9.6 oz (158.6 kg)   SpO2 96%   BMI 56.43 kg/m    Physical Exam Constitutional:      General: She is not in acute distress.    Appearance: Normal appearance. She is not ill-appearing, toxic-appearing or diaphoretic.  HENT:     Head: Normocephalic and atraumatic.     Right Ear: External ear normal.     Left Ear: External ear normal.     Mouth/Throat:     Mouth: Mucous membranes are moist.     Pharynx: Oropharynx is clear. No oropharyngeal exudate or posterior oropharyngeal erythema.  Eyes:     General: No scleral icterus.       Right eye: No discharge.        Left eye: No discharge.     Extraocular Movements: Extraocular movements  intact.     Conjunctiva/sclera: Conjunctivae normal.     Pupils: Pupils are equal, round, and reactive to light.  Cardiovascular:     Pulses:          Dorsalis pedis pulses are 2+ on the right side and 2+ on the left side.       Posterior tibial pulses are 1+ on the right side and 1+ on the left side.  Pulmonary:     Effort: Pulmonary effort is normal. No respiratory distress.  Musculoskeletal:     Cervical back: No tenderness.  Skin:    General: Skin is warm and dry.  Neurological:     Mental Status: She is alert and oriented to person, place, and time.  Psychiatric:        Mood and Affect: Mood normal.        Behavior: Behavior normal.    Diabetic Foot Exam - Simple   Simple Foot Form Diabetic Foot exam was performed with the following findings: Yes 07/22/2021  1:52 PM  Visual Inspection See comments: Yes Sensation Testing Intact to touch and monofilament testing bilaterally: Yes Pulse Check Posterior Tibialis and Dorsalis pulse intact bilaterally: Yes Comments Feet are pes planus bilaterally.  There are no lesions or rashes.      No results found for  any visits on 07/22/21.    The ASCVD Risk score (Arnett DK, et al., 2019) failed to calculate for the following reasons:   The valid total cholesterol range is 130 to 320 mg/dL    Assessment & Plan:   Problem List Items Addressed This Visit       Cardiovascular and Mediastinum   Type 2 diabetes mellitus with diabetic peripheral angiopathy without gangrene, without long-term current use of insulin (HCC) - Primary (Chronic)   Relevant Medications   Continuous Blood Gluc Sensor (FREESTYLE LIBRE SENSOR SYSTEM) MISC   Blood Glucose Monitoring Suppl ([place]) w/Device KIT   Other Relevant Orders   Ambulatory referral to Ophthalmology     Nervous and Auditory   Polyneuropathy associated with underlying disease ([place])   Relevant Orders   Ambulatory referral to Ophthalmology    Return in about 6  months (around 01/21/2022).    [name], MD

## 2021-07-22 NOTE — Telephone Encounter (Signed)
-----   Message from Gardenia Phlegm, NP sent at 07/21/2021  6:59 PM EDT ----- Please let patient know her labs are stable.  Please order following for her next lab: CBC, CMET, Retic panel, b12, iron/tibc, ferritin, folate  Thanks, LC ----- Message ----- From: Interface, Lab In [place] Sent: 07/14/2021  10:02 AM EDT To: Gardenia Phlegm, NP

## 2021-07-22 NOTE — Telephone Encounter (Signed)
Appointment scheduled.

## 2021-07-22 NOTE — Telephone Encounter (Signed)
-----   Message from Gardenia Phlegm, NP sent at 07/21/2021  6:59 PM EDT ----- Please let patient know her labs are stable.  Please order following for her next lab: CBC, CMET, Retic panel, b12, iron/tibc, ferritin, folate  Thanks, LC ----- Message ----- From: Interface, Lab In [place] Sent: 07/14/2021  10:02 AM EDT To: Gardenia Phlegm, NP

## 2021-07-27 ENCOUNTER — Telehealth: Payer: Self-pay | Admitting: Family Medicine

## 2021-07-27 DIAGNOSIS — G63 Polyneuropathy in diseases classified elsewhere: Secondary | ICD-10-CM

## 2021-07-27 DIAGNOSIS — E1169 Type 2 diabetes mellitus with other specified complication: Secondary | ICD-10-CM

## 2021-07-27 NOTE — Telephone Encounter (Signed)
Pt Wants to discuss paper work about diabetic shoes. Call Pam at [phone] at [place] and she can help get this process going.

## 2021-07-28 NOTE — Telephone Encounter (Signed)
Patient aware that order and form both signed and faxed back to [place].

## 2021-07-28 NOTE — Telephone Encounter (Signed)
Called and spoke with Pam at [facility] per Pam they will need a prescription for diabetic shoes okay for Rx to be sent? Please advise

## 2021-08-04 ENCOUNTER — Telehealth: Payer: Self-pay

## 2021-08-04 NOTE — Progress Notes (Signed)
Chronic Care Management APPOINTMENT REMINDER   Stacey Page was reminded to have all medications, supplements and any blood glucose and blood pressure readings available for review with Junius Argyle, Pharm. D, at her telephone visit on 08/05/2021 at 3:45 pm.  Patient Confirm appointment.  [place] Pharmacist Assistant [phone]

## 2021-08-05 ENCOUNTER — Ambulatory Visit: Payer: Medicare Other

## 2021-08-05 ENCOUNTER — Ambulatory Visit: Payer: No Typology Code available for payment source

## 2021-08-05 DIAGNOSIS — E1151 Type 2 diabetes mellitus with diabetic peripheral angiopathy without gangrene: Secondary | ICD-10-CM

## 2021-08-05 DIAGNOSIS — I5032 Chronic diastolic (congestive) heart failure: Secondary | ICD-10-CM

## 2021-08-05 MED ORDER — ONETOUCH DELICA LANCETS 30G MISC: 11 refills | Status: DC

## 2021-08-05 NOTE — Patient Instructions (Addendum)
Visit Information It was great speaking with you today!  Please let me know if you have any questions about our visit.   Goals Addressed             This Visit's Progress    Track and Manage Fluids and Swelling-Heart Failure   On track    Timeframe:  Long-Range Goal Priority:  High Start Date: 04/29/21                            Expected End Date: 04/30/22                      Follow Up within 90 days   - call office if I gain more than 2 pounds in one day or 5 pounds in one week - keep legs up while sitting - track weight in diary - use salt in moderation    Why is this important?   It is important to check your weight daily and watch how much salt and liquids you have.  It will help you to manage your heart failure.    Notes:        Patient Care Plan: General Pharmacy (Adult)     Problem Identified: Hypertension, Hyperlipidemia, Diabetes, Heart Failure, Chronic Kidney Disease, and Allergic Rhinitis   Priority: High     Long-Range Goal: Patient-Specific Goal   Start Date: 04/29/2021  Expected End Date: 04/30/2022  This Visit's Progress: On track  Recent Progress: On track  Priority: High  Note:   Current Barriers:  Unable to independently afford treatment regimen  Pharmacist Clinical Goal(s):  Patient will verbalize ability to afford treatment regimen maintain control of diabetes as evidenced by A1c less than 7%  through collaboration with PharmD and provider.   Interventions: 1:1 collaboration with [name], [name], NP regarding development and update of comprehensive plan of care as evidenced by provider attestation and co-signature Inter-disciplinary care team collaboration (see longitudinal plan of care) Comprehensive medication review performed; medication list updated in electronic medical record  Heart Failure (Goal: manage symptoms and prevent exacerbations) -Controlled -Last ejection fraction: 60-65% (Date: 2021) -HF type: Diastolic -NYHA Class:  II (slight limitation of activity) -Current treatment: Carvedilol 25 mg twice daily: Appropriate, Effective, Safe, Accessible Furosemide 40 mg 3-4 times weekly: Appropriate, Effective, Safe, Accessible  Hydralazine 50 mg three times daily: Appropriate, Effective, Safe, Accessible  Isosorbide dinitrate 20 mg twice daily  Lisinopril 20 mg daily: Appropriate, Effective, Safe, Accessible  -Medications previously tried: NA  -Recommended to continue current medication  Hyperlipidemia: (LDL goal < 70) -Controlled -Current treatment: Atorvastatin 20 mg daily: Appropriate, Effective, Safe, Accessible  -Medications previously tried: NA  -Recommended to continue current medication  Diabetes (A1c goal <7%) -Controlled -Current medications: Januvia 25 mg daily: Appropriate, Effective, Safe, Accessible  -Medications previously tried: Ozempic (Cost)   -Current home glucose readings fasting glucose: NA -Denies hypoglycemic/hyperglycemic symptoms -Walking less due to not having diabetic shoes. She has been working for months to get them approved through her insurance and will be speaking with podiatry tomorrow to finish the paperwork.  She has received the Freestyle Libre CGM, but has not been given instruction on how to use it.  -Recommended to continue current medication  Allergic rhinitis (Goal: Minimize symptoms) -Uncontrolled -Current treatment  Flonase - 2 sprays three times daily: Query Appropriate Montelukast 10 mg daily: Appropriate, Query effective -Medications previously tried: NA -Discussed risks of frequent corticosteroid use.  -  Decrease Flonase use to twice daily at most, advised she could utilize saline nasal spray as needed for congestion in between uses.   Chronic Kidney Disease Stage 4  -All medications assessed for renal dosing and appropriateness in chronic kidney disease. -Managed by Dr. Royce Macadamia, last visit was February 2022.  -Recommended to continue current  medication  Patient Goals/Self-Care Activities Patient will:  - check glucose daily, document, and provide at future appointments check blood pressure weekly, document, and provide at future appointments weigh daily, and contact provider if weight gain of greater than 2 pounds in 24 hours   Follow Up Plan: Face to Face appointment with care management team member scheduled for: 08/26/2021 at 9:15 AM    Patient agreed to services and verbal consent obtained.   Patient verbalizes understanding of instructions and care plan provided today and agrees to view in [place]. Active MyChart status and patient understanding of how to access instructions and care plan via MyChart confirmed with patient.     Junius Argyle, PharmD, Para [place], CPP Clinical Pharmacist Practitioner  [facility]  [phone]

## 2021-08-05 NOTE — Progress Notes (Signed)
Chronic Care Management Pharmacy Note  08/05/2021 Name:  Stacey Page MRN:  [id number] DOB:  [birth year]  Summary: Patient presents for CCM follow-up. Walking less due to not having diabetic shoes. She has been working for months to get them approved through her insurance and will be speaking with podiatry tomorrow to finish the paperwork.   She has received the [facility] CGM, but has not been given instruction on how to use it.   Recommendations/Changes made from today's visit: -Continue current medications  Plan: CPP follow-up 3 months  Subjective: Stacey Page is an 71 y.o. year old female who is a primary patient of Ethelene Hal, Mortimer Fries, MD.  The CCM team was consulted for assistance with disease management and care coordination needs.    Engaged with patient by telephone for follow up visit to re-establish care in response to provider referral for pharmacy case management and/or care coordination services.   Consent to Services:  The patient was given information about Chronic Care Management services, agreed to services, and gave verbal consent prior to initiation of services.  Please see initial visit note for detailed documentation.   Patient Care Team: [name], MD as PCP - General (Family Medicine) Leonie Man, MD as PCP - Cardiology (Cardiology) Germaine Pomfret, [facility] as Pharmacist (Pharmacist) Dannielle Karvonen, RN as Case Manager  Recent office visits: 07/22/21: Patient presented to Dr. Ethelene Hal for follow-up.  07/14/21: Patient presented to Dr. Ethelene Hal for follow-up.   Recent consult visits: 07/19/21: Patient presented to Mina Marble, NP (Weight Management) for follow-up.    Hospital visits: None in previous 6 months   Objective:  Lab Results  Component Value Date   CREATININE 1.87 (H) 07/14/2021   BUN 27 (H) 07/14/2021   GFR 26.66 (L) 02/16/2021   EGFR 29 06/25/2021   GFRNONAA 29 (L) 07/14/2021   GFRAA 40 (L) 10/28/2019    NA 142 07/14/2021   K 4.3 07/14/2021   CALCIUM 9.9 07/14/2021   CO2 32 07/14/2021   GLUCOSE 107 (H) 07/14/2021    Lab Results  Component Value Date/Time   HGBA1C 6.0 (A) 07/14/2021 11:25 AM   HGBA1C 6.4 (A) 04/14/2021 08:56 AM   HGBA1C 6.1 (H) 10/14/2020 10:57 AM   HGBA1C 6.1 05/14/2020 09:10 AM   GFR 26.66 (L) 02/16/2021 08:03 AM   GFR 29.58 (L) 05/21/2020 08:26 AM   MICROALBUR 1.4 05/14/2020 09:10 AM   MICROALBUR 1.9 01/16/2018 11:17 AM    Last diabetic Eye exam:  Lab Results  Component Value Date/Time   HMDIABEYEEXA No Retinopathy 11/05/2020 12:00 AM    Last diabetic Foot exam:  Lab Results  Component Value Date/Time   HMDIABFOOTEX normal 09/10/2018 12:00 AM     Lab Results  Component Value Date   CHOL 116 05/14/2020   HDL 51.60 05/14/2020   LDLCALC 52 05/14/2020   TRIG 61.0 05/14/2020   CHOLHDL 2 05/14/2020       Latest Ref Rng & Units 07/14/2021    9:40 AM 06/25/2021   12:00 AM 04/13/2021    9:34 AM  Hepatic Function  Total Protein 6.5 - 8.1 g/dL 7.6   7.6   Albumin 3.5 - 5.0 g/dL 3.9  4.1     3.8   AST 15 - 41 U/L 10   10   ALT 0 - 44 U/L 7   8   Alk Phosphatase 38 - 126 U/L 65   67   Total Bilirubin 0.3 - 1.2 mg/dL 0.3  0.4      This result is from an external source.    Lab Results  Component Value Date/Time   TSH 0.51 11/28/2018 10:42 AM   TSH 0.81 01/19/2017 10:13 AM       Latest Ref Rng & Units 07/14/2021    9:40 AM 06/25/2021   12:00 AM 04/13/2021    9:34 AM  CBC  WBC 4.0 - 10.5 K/uL 4.8   4.7   Hemoglobin 12.0 - 15.0 g/dL 11.6  12.2     11.9   Hematocrit 36.0 - 46.0 % 37.9   38.7   Platelets 150 - 400 K/uL 205   197      This result is from an external source.    Lab Results  Component Value Date/Time   VD25OH 57.6 10/28/2019 09:31 AM   VD25OH 41.5 07/04/2019 12:40 PM    Clinical ASCVD: No  The ASCVD Risk score (Arnett DK, et al., 2019) failed to calculate for the following reasons:   The valid total cholesterol range is 130 to  320 mg/dL       07/22/2021    1:18 PM 07/14/2021    9:09 AM 07/14/2021    8:24 AM  Depression screen PHQ 2/9  Decreased Interest 0 0 0  Down, Depressed, Hopeless 0 0 0  PHQ - 2 Score 0 0 0  Altered sleeping  0   Tired, decreased energy  0   Change in appetite  0   Feeling bad or failure about yourself   0   Trouble concentrating  0   Moving slowly or fidgety/restless  0   Suicidal thoughts  0   PHQ-9 Score  0   Difficult doing work/chores  Not difficult at all     Social History   Tobacco Use  Smoking Status Former   Packs/day: 0.25   Years: 15.00   Total pack years: 3.75   Types: Cigarettes   Quit date: 02/08/1980   Years since quitting: 41.5  Smokeless Tobacco Former   BP Readings from Last 3 Encounters:  07/22/21 (!) 179/80  07/19/21 (!) 144/67  07/14/21 (!) 152/70   Pulse Readings from Last 3 Encounters:  07/22/21 (!) 57  07/19/21 69  07/14/21 (!) 58   Wt Readings from Last 3 Encounters:  07/22/21 (!) 349 lb 9.6 oz (158.6 kg)  07/19/21 (!) 340 lb (154.2 kg)  07/14/21 (!) 350 lb (158.8 kg)   BMI Readings from Last 3 Encounters:  07/22/21 56.43 kg/m  07/19/21 54.88 kg/m  07/14/21 56.49 kg/m    Assessment/Interventions: Review of patient past medical history, allergies, medications, health status, including review of consultants reports, laboratory and other test data, was performed as part of comprehensive evaluation and provision of chronic care management services.   SDOH:  (Social Determinants of Health) assessments and interventions performed: Yes   SDOH Screenings   Alcohol Screen: Low Risk  (08/04/2020)   Alcohol Screen    Last Alcohol Screening Score (AUDIT): 0  Depression (PHQ2-9): Low Risk  (07/22/2021)   Depression (PHQ2-9)    PHQ-2 Score: 0  Financial Resource Strain: Low Risk  (04/29/2021)   Overall Financial Resource Strain (CARDIA)    Difficulty of Paying Living Expenses: Not hard at all  Food Insecurity: Food Insecurity Present  (10/21/2020)   Hunger Vital Sign    Worried About Running Out of Food in the Last Year: Sometimes true    Ran Out of Food in the Last Year: Sometimes true  Housing:  Low Risk  (08/04/2020)   Housing    Last Housing Risk Score: 0  Physical Activity: Inactive (08/04/2020)   Exercise Vital Sign    Days of Exercise per Week: 0 days    Minutes of Exercise per Session: 0 min  Social Connections: Moderately Isolated (08/04/2020)   Social Connection and Isolation Panel [NHANES]    Frequency of Communication with Friends and Family: More than three times a week    Frequency of Social Gatherings with Friends and Family: Twice a week    Attends Religious Services: More than 4 times per year    Active Member of [employer] or Organizations: No    Attends Archivist Meetings: Never    Marital Status: Widowed  Stress: No Stress Concern Present (08/04/2020)   [place]    Feeling of Stress : Not at all  Tobacco Use: Medium Risk (07/22/2021)   Patient History    Smoking Tobacco Use: Former    Smokeless Tobacco Use: Former    Passive Exposure: Not on Pensions consultant Needs: No Transportation Needs (10/21/2020)   PRAPARE - Hydrologist (Medical): No    Lack of Transportation (Non-Medical): No    CCM Care Plan  No Known Allergies  Medications Reviewed Today     Reviewed by [name], MD (Physician) on 07/22/21 at 1346  Med List Status: <None>   Medication Order Taking? Sig Documenting Provider Last Dose Status Informant  acetaminophen (TYLENOL) 500 MG tablet [id number] Yes Take 1 tablet (500 mg total) by mouth every 6 (six) hours as needed. [name], [name], NP Taking Active Self           Med Note Michaelle Birks, ALEXANDRE A   Thu Apr 29, 2021  4:11 PM) Rarely uses  albuterol (PROAIR HFA) 108 (90 Base) MCG/ACT inhaler [id number] Yes Inhale 1-2 puffs into the lungs every 6 (six) hours as  needed for wheezing or shortness of breath. Flossie Buffy, NP Taking Active   aspirin 81 MG tablet [id number] Yes Take 1 tablet (81 mg total) by mouth daily with breakfast. [name], [name], NP Taking Active Self  atorvastatin (LIPITOR) 20 MG tablet [id number] Yes TAKE 1 TABLET BY MOUTH  DAILY AT 6 PM. [name], [name], NP Taking Active   carvedilol (COREG) 25 MG tablet [id number] Yes Take 1 tablet (25 mg total) by mouth 2 (two) times daily. Leonie Man, MD Taking Active   cholecalciferol (VITAMIN D3) 25 MCG (1000 UNIT) tablet [id number] Yes Take 2,000 Units by mouth daily. [provider] Taking Active   Continuous Blood Gluc Sensor ([place]) [place] [id number] Yes Inject 1 kit into the skin daily. [name], MD  Active   Docusate Sodium 100 MG capsule [id number] Yes Take 100 mg by mouth 2 (two) times daily. [provider] Taking Active Self  ferrous sulfate 325 (65 FE) MG tablet [id number] Yes Take 325 mg by mouth 2 (two) times daily with a meal. [provider] Taking Active Self  fluticasone (FLONASE) 50 MCG/ACT nasal spray [id number] Yes INSTILL 1 SPRAY IN EACH NOSTRIL DAILY [name], [name], NP Taking Active   furosemide (LASIX) 40 MG tablet [id number] Yes TAKE 1 TAB FOUR TIMES PER WEEK, TAKE AN ADDITIONAL TAB FOR WEIGHT GAIN OF 2 LBS OVERNIGHT OR 5 LBS IN ONE WEEK Leonie Man, MD Taking Active   gabapentin (NEURONTIN) 100 MG capsule [id number] Yes Take  1 capsule (100 mg total) by mouth 2 (two) times daily. [name], [name], NP Taking Active   hydrALAZINE (APRESOLINE) 50 MG tablet [id number] Yes Take 1 tablet (50 mg total) by mouth 3 (three) times daily. Leonie Man, MD Taking Active   isosorbide dinitrate (ISORDIL) 20 MG tablet [id number] Yes Take 1 tablet ( 20 mg total ) by mouth in morning and evening with Hydralazine 50 mg  dose Leonie Man, MD Taking Active   JANUVIA 25 MG tablet [id number] Yes TAKE ONE  TABLET BY MOUTH ONCE DAILY [name], [name], NP Taking Active   lisinopril (ZESTRIL) 20 MG tablet [id number] Yes TAKE 1 TABLET BY MOUTH ONCE DAILY WITH BREAKFAST [name], [name], NP Taking Active   montelukast (SINGULAIR) 10 MG tablet [id number] Yes Take 1 tablet (10 mg total) by mouth at bedtime. Flossie Buffy, NP Taking Active             Patient Active Problem List   Diagnosis Date Noted   Need for shingles vaccine 07/14/2021   Iron deficiency anemia 01/13/2021   CKD (chronic kidney disease) stage 4, GFR 15-29 ml/min (HCC) 07/09/2019   Diabetes mellitus type 2 in obese ([place]) 07/02/2019   Polyneuropathy associated with underlying disease ([place]) 11/28/2018   Severe episode of recurrent major depressive disorder, without psychotic features ([place]) 11/28/2018   Type 2 diabetes mellitus with diabetic peripheral angiopathy without gangrene, without long-term current use of insulin ([place]) 11/28/2018   Poor short term memory 11/28/2018   Mild aortic stenosis by prior echocardiogram 11/06/2018   Diabetes mellitus ([place]) 03/21/2017   Symptomatic anemia 03/17/2016   S/P total knee arthroplasty, right 03/17/2016   Unilateral primary osteoarthritis, right knee    Hyperlipidemia associated with type 2 diabetes mellitus ([place]) 12/15/2015   Pain in both lower extremities 11/19/2015   Numbness in both hands 08/13/2015   Panic attack 06/09/2015   Right knee pain 05/14/2015   Chronic renal insufficiency 09/16/2014   Pneumothorax on right    Atypical mycobacterium infection    ILD (interstitial lung disease) 2/2 MAI s/p med rxn    Class 3 severe obesity with serious comorbidity and body mass index (BMI) of 50.0 to 59.9 in adult ([place]) 04/03/2014   OSA (obstructive sleep apnea) 02/25/2014   Hypertensive heart disease with chronic diastolic congestive heart failure ([place]) 02/25/2014   DOE (dyspnea on exertion) 02/25/2014   Hypertension associated with diabetes ([place]) 02/25/2014     Immunization History  Administered Date(s) Administered   Fluad Quad(high Dose 65+) 11/28/2018, 01/29/2020   Influenza Split 11/21/2016   Influenza,inj,Quad PF,6+ Mos 01/12/2015, 11/19/2015   PFIZER(Purple Top)SARS-COV-2 Vaccination 03/31/2019, 04/23/2019, 03/21/2020   Pneumococcal Conjugate-13 01/19/2017   Pneumococcal Polysaccharide-23 01/12/2015, 05/14/2020   Zoster Recombinat (Shingrix) 07/14/2021    Conditions to be addressed/monitored:  Hypertension, Hyperlipidemia, Diabetes, Heart Failure, Chronic Kidney Disease, and Allergic Rhinitis  Care Plan : General Pharmacy (Adult)  Updates made by Germaine Pomfret, RPH since 08/05/2021 12:00 AM     Problem: Hypertension, Hyperlipidemia, Diabetes, Heart Failure, Chronic Kidney Disease, and Allergic Rhinitis   Priority: High     Long-Range Goal: Patient-Specific Goal   Start Date: 04/29/2021  Expected End Date: 04/30/2022  This Visit's Progress: On track  Recent Progress: On track  Priority: High  Note:   Current Barriers:  Unable to independently afford treatment regimen  Pharmacist Clinical Goal(s):  Patient will verbalize ability to afford treatment regimen maintain control of diabetes as evidenced by A1c less than 7%  through collaboration with PharmD and provider.   Interventions: 1:1 collaboration with [name], [name], NP regarding development and update of comprehensive plan of care as evidenced by provider attestation and co-signature Inter-disciplinary care team collaboration (see longitudinal plan of care) Comprehensive medication review performed; medication list updated in electronic medical record  Heart Failure (Goal: manage symptoms and prevent exacerbations) -Controlled -Last ejection fraction: 60-65% (Date: 2021) -HF type: Diastolic -NYHA Class: II (slight limitation of activity) -Current treatment: Carvedilol 25 mg twice daily: Appropriate, Effective, Safe, Accessible Furosemide 40 mg 3-4 times  weekly: Appropriate, Effective, Safe, Accessible  Hydralazine 50 mg three times daily: Appropriate, Effective, Safe, Accessible  Isosorbide dinitrate 20 mg twice daily  Lisinopril 20 mg daily: Appropriate, Effective, Safe, Accessible  -Medications previously tried: NA  -Recommended to continue current medication  Hyperlipidemia: (LDL goal < 70) -Controlled -Current treatment: Atorvastatin 20 mg daily: Appropriate, Effective, Safe, Accessible  -Medications previously tried: NA  -Recommended to continue current medication  Diabetes (A1c goal <7%) -Controlled -Current medications: Januvia 25 mg daily: Appropriate, Effective, Safe, Accessible  -Medications previously tried: Ozempic (Cost)   -Current home glucose readings fasting glucose: NA -Denies hypoglycemic/hyperglycemic symptoms -Walking less due to not having diabetic shoes. She has been working for months to get them approved through her insurance and will be speaking with podiatry tomorrow to finish the paperwork.  She has received the Freestyle Libre CGM, but has not been given instruction on how to use it.  -Recommended to continue current medication  Allergic rhinitis (Goal: Minimize symptoms) -Uncontrolled -Current treatment  Flonase - 2 sprays three times daily: Query Appropriate Montelukast 10 mg daily: Appropriate, Query effective -Medications previously tried: NA -Discussed risks of frequent corticosteroid use.  -Decrease Flonase use to twice daily at most, advised she could utilize saline nasal spray as needed for congestion in between uses.   Chronic Kidney Disease Stage 4  -All medications assessed for renal dosing and appropriateness in chronic kidney disease. -Managed by Dr. Royce Macadamia, last visit was February 2022.  -Recommended to continue current medication  Patient Goals/Self-Care Activities Patient will:  - check glucose daily, document, and provide at future appointments check blood pressure weekly,  document, and provide at future appointments weigh daily, and contact provider if weight gain of greater than 2 pounds in 24 hours   Follow Up Plan: Face to Face appointment with care management team member scheduled for: 08/26/2021 at 9:15 AM    Medication Assistance:  Januvia obtained through [employer] medication assistance program.  Enrollment ends Dec 2023   Compliance/Adherence/Medication fill history: Care Gaps: Shingrix Vaccine COVID-19 Vaccine  Star-Rating Drugs: Atorvastatin 20 mg last filled 03/08/2021 23 day supply at [facility]. Lisinopril 20 mg last filled 02/16/2021 45 day supply at [facility].  Patient's preferred pharmacy is:  [facility] - [place], [place] - [address] [place] [address] Phone: [phone] Fax: [phone]  Uses pill box? Yes Pt endorses 100% compliance  Patient decided to: Utilize [facility] for medication synchronization, packaging and delivery  Care Plan and Follow Up Patient Decision:  Patient agrees to Care Plan and Follow-up.  Plan: Face to Face appointment with care management team member scheduled for: 08/26/2021 at [place], PharmD, Para March, CPP Clinical Pharmacist Practitioner  [facility]  [phone]

## 2021-08-05 NOTE — Progress Notes (Signed)
Primary Care Provider: [name], MD Cardiologist: Stacey Hew, MD Electrophysiologist: None  Clinic Note: Chief Complaint  Patient presents with   Follow-up    (64-month echo results and CPX to evaluate dyspnea and reassess aortic stenosis.   Hypertension    Not adequately controlled   Shortness of Breath    Multifactorial-test results reviewed.   ===================================  ASSESSMENT/PLAN   Problem List Items Addressed This Visit       Cardiology Problems   Hypertensive heart disease with chronic diastolic congestive heart failure (HCC) (Chronic)    Remains hypertensive with significant obesity leading to a combination of HFpEF along with possible OHS along with OSA.  Continue titrated to hypertensives.  Unfortunately, there is clear evidence of chronotropic incompetence and we probably need to reduce the beta-blocker as opposed to increase the dose.  Plan: Increase hydralazine to 100 mg 3 times daily and Isordil to 40 mg twice daily for afterload reduction. Currently on lisinopril, -> if cleared by nephrology, [facility] standing dose of Lasix but alternating every other day (every other week for days versus 3 days)  Would like to add spironolactone-have asked that she query her nephrologist about the use of spironolactone but but did not mention the Entresto.  But this is also a question from nephrology as well. Remaining options also include calcium channel blocker-nifedipine/amlodipine. For additional heart failure management, could consider SGLT2 inhibitor (Wilder Glade      Relevant Medications   hydrALAZINE (APRESOLINE) 50 MG tablet   isosorbide dinitrate (ISORDIL) 20 MG tablet   Hyperlipidemia associated with type 2 diabetes mellitus (HCC) (Chronic)    Due for her lipids to be checked this summer with PCP.  She has switched over to Dr. KEthelene Halas PCP  Lipids look well controlled as of April 2022.   Need labs soon.  No longer on GLP-1 agonist-would consider restarting given concerns for obesity. Could also consider SGLT2 inhibitor (Wilder Gladehas HFpEF indication)      Relevant Medications   hydrALAZINE (APRESOLINE) 50 MG tablet   isosorbide dinitrate (ISORDIL) 20 MG tablet   Mild aortic stenosis by prior echocardiogram - Primary (Chronic)    Echo now read as mild to moderate with a gradient of 18 mmHg.  Plan: Recheck in 2 to 3 years.      Relevant Medications   hydrALAZINE (APRESOLINE) 50 MG tablet   isosorbide dinitrate (ISORDIL) 20 MG tablet     Other   DOE (dyspnea on exertion)   CKD (chronic kidney disease) stage 4, GFR 15-29 ml/min (HCC)    See various between CKD 3B to CKD 4.  Followed by nephrology-Stacey Page  I have asked that she pass on the message about spironolactone to Stacey Page  If she does read my note, would also want her to consider the possibility [facility]  Dr. FRoyce Macadamiais managing her diuretic dosing as well.      OSA (obstructive sleep apnea) (Chronic)    No notes from pulmonary medicine since 2016. - consider referral back to Dr. SHalford Page& Stacey Edison NP.      Class 3 severe obesity with serious comorbidity and body mass index (BMI) of 50.0 to 59.9 in adult ([facility] (Chronic)    Severe obesity.  Having a hard time losing weight.  She had been on GLP-1 agonist, no longer on it.  Defer to PCP or endocrinology, but would strongly consider restarting.  Recommend referral to healthy weight and  wellness, and reiterated [employer] [facility].      ILD (interstitial lung disease) 2/2 MAI s/p med rxn (Chronic)    She has underlying ILD which does contribute to her dyspnea.  Interestingly the PFTs/spirometry suggestive of restrictive physiology from obesity.  I think she probably needs to continue to follow-up with nephrology      ===================================  HPI:    Stacey Page is a Super Morbidly Obese 71 y.o. female with a PMH  Notable for Hypertensive Heart Disease with HFpEF,OSA with CPAP (likely OHS), HLD and DM-2 (with diabetic peripheral angiopathy/neuropathy), CKD 3B-4 who presents today for 50-monthfollow-up to discuss test results..  She follows up today at the request of Stacey Page, Stacey Brooke NP.  Stacey Ibanezwas seen by CLaurann Montana NP in July 2022 as a ER follow-up for dyspnea, fever and headache.  She had left due to extended wait..  Negative for COVID the BNP was mildly elevated.  Chest x-ray showed some cardiac enlargement with peribronchial changes consistent with pulmonary edema versus pneumonia. => Fever has subsided and dyspnea is back to baseline.  No formal exercise but does walk.  Has 2 grandsons and enjoys spending time with them.  Walking exercises.  Followed by podiatry.  Nephrologist reduced her Lasix to 3 times a week (currently at 4 times a week).  No other symptoms. -> Discussed sliding scale diuretic. Mild aortic stenosis noted on echo.  Plan for follow-up echocardiogram in roughly 3 years Recommended [employer] [facility], wanted to wait till her foot pain healed.  I then saw her on June 01, 2022: She noted that she is always short of breath, usually okay with rest but gets dyspneic with any particular activity.  Walking limited by foot pain and unsteady gait.  Rare palpitations but nothing sustained.  No chest pain with rest or exertion. =>  Every now and then she uses her pedal machine at the house, but admits that she is somewhat lazy and does not do it as much she should.  As such, she is very deconditioned. No PND orthopnea.  Not using any additional dose of Lasix beyond 4 times a week.  Sleeps with CPAP.  She does note that her heart rate goes up with exercise. 2D echo and CPX ordered  Recent Hospitalizations: None  Reviewed  CV studies:    The following studies were reviewed today: (if available, images/films reviewed: From Epic Chart or Care Everywhere) TTE 06/14/2021: EF 60 to 65%.   No RWMA.  Moderate cLVH with GR 1 DD.  Mild LA dilation.  Mild to moderate aortic calcification/stenosis.  Mean AVG 18 mmHg. CPX 06/21/2021: Performed on carvedilol 25 mg twice daily PFTs: FVC 1.76 (60%), FEV1 1.59 (77%), ratio 114%.  MVV 69 (76%). Exercise time 5 minutes at 1 mph 2% grade.  Stopped for 7 out of 10 dyspnea.  Noted difficulty ambulating getting back and forth to the equipment.  Pulse Ox 95-98%, low 90% -> Recovery 97% Heart rate increased from 64 up to 90 bpm (only 60% max predicted HR--intentionally performed on stable dose of carvedilol 25 mg twice daily) BP increased from 106/68-160/64 mmHg. Peak VO2 8.7 which is 70% predicted Preliminary Conclusion: Normal functional capacity compared to max sedentary norms.  No clear-cut pulm limitation or preexercise spirometry and I recommend large-size radial body habitus as a major contributor to exercise intolerance.  Evidence of chronotropic incompetence also noted. Attending attestation: Resting spirometry shows restrictive physiology.  Unadjusted peak VO2 quite low suggesting body habitus  is the main limiting factor in her exercise limitation.  This is in conjunction with significant chronotropic competence.  Interval History:   Stacey Page returns here today for follow-up to discuss test results.  Still notes need to loose weight.  Still has DOE - acknowledges her wight & deconditioning.  She says that her walking is still limited by unsteady gait and foot pain which really makes it difficult for her.  Her joints make it very hard for her to exercise.  Stable swelling-has not taken any extra doses of Lasix.  Notes that her blood pressure it seems to be better controlled at home than it is here today, but she does note that she is easily fatigued.  Clearly this is multifactorial because she is very deconditioned.  Interestingly, she said that she feels her heart rate goes fast with exertion, but this was not what was seen on the  CPX.  CV Review of Symptoms (Summary) Cardiovascular ROS: positive for - dyspnea on exertion, edema, shortness of breath, and rapid heart rate response to exercise (although this was not seen on the CPX); exercise seems to be more limited by foot pain than dyspnea. negative for - chest pain, irregular heartbeat, orthopnea, paroxysmal nocturnal dyspnea, shortness of breath, or syncope or near syncope TIA/amaurosis fugax, claudication   We went through the echocardiogram and CPX tests in detail.  Mostly spent time on the findings of mild to moderate aortic stenosis (for which we will follow-up with echocardiogram in 3 years), chronotropic incompetence (this would require reducing dose of carvedilol which is an important component of her antihypertensive regimen and may require additional medications), and her body habitus/obesity and deconditioning.  REVIEWED OF SYSTEMS   Review of Systems  Constitutional:  Positive for malaise/fatigue. Negative for weight loss.  HENT:  Negative for congestion and nosebleeds.   Respiratory:  Positive for shortness of breath (with any exertion).        Sleep better using CPAP.  Cardiovascular:  Positive for leg swelling (controlled).       Per HPI  Gastrointestinal:  Negative for blood in stool, constipation and melena.  Genitourinary:  Negative for flank pain, frequency (Only which takes Lasix) and hematuria.  Musculoskeletal:  Positive for back pain and joint pain. Negative for falls.       Her foot knee and hip pain is really what limits her walking.  Neurological:  Positive for dizziness (Occasionally) and tingling (Pedal peripheral neuropathy.). Negative for focal weakness and weakness.  Psychiatric/Behavioral:  Negative for depression (She seems to be in good spirits.  Just a little upset about not being able to lose weight) and memory loss. The patient is not nervous/anxious and does not have insomnia.     I have reviewed and (if needed) personally  updated the patient's problem list, medications, allergies, past medical and surgical history, social and family history.   PAST MEDICAL HISTORY   Past Medical History:  Diagnosis Date   Anxiety    doesn't take any meds   Arthritis of right knee 03/14/2016   Asthma    Back pain    Chronic diastolic CHF (congestive heart failure) (HCC)    HF with Preserved EF (60-65%) - Grade II Diastolic Dysfunction (Hypertensive Heart Disease). takes Furosemide daily   Depression    doesn't take meds   Diabetes ([place])    takes Januvia daily   Eczema    uses cream as needed   GERD (gastroesophageal reflux disease)    History of  bronchitis as a child    HTN (hypertension)    Insomnia    Mild aortic stenosis by prior echocardiogram 04/2017   Mild stenosis: Mean gradient 15 mmHg, peak gradient 28 mmHg   Mycobacterium avium-intracellulare infection ([place]) 2016   OA (osteoarthritis)    Obese    OSA (obstructive sleep apnea) 02/25/2014   wears CPAP at night   Peripheral neuropathy    takes Gabapentin as needed   Pneumonia    hx of-2010   Seasonal allergies    uses Flonase daily   Sleep apnea     No formal exercise but does walk.  Has 2 grandsons and enjoys spending time with them.  Walking exercises.  Followed by podiatry.  Nephrologist reduced her Lasix to 3 times a week (currently at 4 times a week).   PAST SURGICAL HISTORY   Past Surgical History:  Procedure Laterality Date   BREAST BIOPSY Right 03/2018   BREAST LUMPECTOMY WITH RADIOACTIVE SEED LOCALIZATION Right 12/28/2018   Procedure: RIGHT BREAST LUMPECTOMY WITH RADIOACTIVE SEED LOCALIZATION;  Surgeon: Jovita Kussmaul, MD;  Location: [place];  Service: General;  Laterality: Right;   BREAST SURGERY     CARDIOPULMONARY EXERCISE TEST (CPX)  06/21/2021   (Performed on Coreg 25 mg twice daily): PFTs: FVC 1.76 (60%), FEV1 1.59 (77%), ratio 114% => RESTRICTIVE PHYSIOLOGY; 5 min- 1 mph - 2% grade. 7/10 dyspnea -> pulse ox range during exercise 95  to 98%, low of 90%, 97% on recovery; BP 106/84-160/64; heart rate 64-90 bpm (60% MPHR) -> Chronotropic Incompetence;;MAIN FACTOR for Exercise Limitation = BODY HABITUS w/ Chronotropic Incompetence.   CESAREAN SECTION  x2   COLONOSCOPY     CORONARY CALCIUM SCORE & CT ANGIOGRAM  09/2017   Coronary Ca Score = 11 (low).  CTA- no obstructive CAD (minimal disease)   JOINT REPLACEMENT     KNEE ARTHROSCOPY Right    LUNG BIOPSY Right 06/10/2014   Procedure: LUNG BIOPSY;  Surgeon: Ivin Poot, MD;  Location: [place];  Service: Thoracic;  Laterality: Right;   POLYPECTOMY     throat   TOTAL KNEE ARTHROPLASTY Right 03/14/2016   Procedure: RIGHT TOTAL KNEE ARTHROPLASTY;  Surgeon: Marybelle Killings, MD;  Location: [place];  Service: Orthopedics;  Laterality: Right;   TRANSTHORACIC ECHOCARDIOGRAM  11/04/2019   EF 60-65%.  Moderate LVH.  GRII DD.  Mild LV dilation.  Mild LA dilation.  Thickened-calcified aortic valve-Mild AS (mean gradient 13 mmHg, peak 29 mmHg--similar to 2019)   TRANSTHORACIC ECHOCARDIOGRAM  06/14/2021   EF 60 to 65%.  No RWMA.  Moderate cLVH with GR 1 DD.  Mild LA dilation.  Mild to moderate aortic calcification/stenosis.  Mean AVG 18 mmHg.   VIDEO ASSISTED THORACOSCOPY Right 06/10/2014   Procedure: VIDEO ASSISTED THORACOSCOPY;  Surgeon: Ivin Poot, MD;  Location: [place];  Service: Thoracic;  Laterality: Right;   TTE 11/04/2019: EF 60 to 65%.  Normal LV size and systolic function.  Moderate LVH.  GR 2 DD with mild LA dilation.  Mild MR.  Mild AS (no change 19)-mean gradient 13 mmHg (peak 29 mmHg).  Normal RAP and PAP.  Immunization History  Administered Date(s) Administered   Fluad Quad(high Dose 65+) 11/28/2018, 01/29/2020   Influenza Split 11/21/2016   Influenza,inj,Quad PF,6+ Mos 01/12/2015, 11/19/2015   PFIZER(Purple Top)SARS-COV-2 Vaccination 03/31/2019, 04/23/2019, 03/21/2020   Pneumococcal Conjugate-13 01/19/2017   Pneumococcal Polysaccharide-23 01/12/2015, 05/14/2020   Zoster  Recombinat (Shingrix) 07/14/2021    MEDICATIONS/ALLERGIES   Current  Meds  Medication Sig   albuterol (PROAIR HFA) 108 (90 Base) MCG/ACT inhaler Inhale 1-2 puffs into the lungs every 6 (six) hours as needed for wheezing or shortness of breath.   aspirin 81 MG tablet Take 1 tablet (81 mg total) by mouth daily with breakfast.   atorvastatin (LIPITOR) 20 MG tablet TAKE 1 TABLET BY MOUTH  DAILY AT 6 PM.   Blood Glucose Monitoring Suppl ([place]) w/Device KIT 1 kit by Does not apply route daily as needed.   carvedilol (COREG) 25 MG tablet Take 1 tablet (25 mg total) by mouth 2 (two) times daily.   cholecalciferol (VITAMIN D3) 25 MCG (1000 UNIT) tablet Take 2,000 Units by mouth daily.   Continuous Blood Gluc Sensor (FREESTYLE LIBRE SENSOR SYSTEM) MISC Inject 1 kit into the skin daily.   Docusate Sodium 100 MG capsule Take 100 mg by mouth 2 (two) times daily.   ferrous sulfate 325 (65 FE) MG tablet Take 325 mg by mouth 2 (two) times daily with a meal.   fluticasone (FLONASE) 50 MCG/ACT nasal spray INSTILL 1 SPRAY IN EACH NOSTRIL DAILY   furosemide (LASIX) 40 MG tablet TAKE 1 TAB FOUR TIMES PER WEEK, TAKE AN ADDITIONAL TAB FOR WEIGHT GAIN OF 2 LBS OVERNIGHT OR 5 LBS IN ONE WEEK   gabapentin (NEURONTIN) 100 MG capsule Take 1 capsule (100 mg total) by mouth 2 (two) times daily.   glucose blood (ONETOUCH VERIO) test strip 1 each by Other route as needed for other. Use as instructed   JANUVIA 25 MG tablet TAKE ONE TABLET BY MOUTH ONCE DAILY   lisinopril (ZESTRIL) 20 MG tablet TAKE 1 TABLET BY MOUTH ONCE DAILY WITH BREAKFAST   montelukast (SINGULAIR) 10 MG tablet Take 1 tablet (10 mg total) by mouth at bedtime.   OneTouch Delica Lancets 91Y MISC Use to monitor blood sugars once daily as directed   '[]'  hydrALAZINE (APRESOLINE) 50 MG tablet Take 1 tablet (50 mg total) by mouth 3 (three) times daily.   '[]'  isosorbide dinitrate (ISORDIL) 20 MG tablet Take 1 tablet ( 20 mg total ) by mouth in  morning and evening with Hydralazine 50 mg  dose    No Known Allergies  SOCIAL HISTORY/FAMILY HISTORY   Reviewed in Epic:  Pertinent findings:  Social History   Tobacco Use   Smoking status: Former    Packs/day: 0.25    Years: 15.00    Total pack years: 3.75    Types: Cigarettes    Quit date: 02/08/1980    Years since quitting: 41.5   Smokeless tobacco: Former  Scientific laboratory technician Use: Never used  Substance Use Topics   Alcohol use: Yes    Alcohol/week: 0.0 standard drinks of alcohol    Comment: occassional/social/rare   Drug use: Not Currently   Social History   Social History Narrative   Not on file    OBJCTIVE -PE, EKG, labs   Wt Readings from Last 3 Encounters:  08/06/21 (!) 348 lb 6.4 oz (158 kg)  07/22/21 (!) 349 lb 9.6 oz (158.6 kg)  07/19/21 (!) 340 lb (154.2 kg)  Jan 21-weighed 338 pounds; September 2021 was 333 after having dropped from 352 in September 2020  Physical Exam: BP (!) 148/78   Pulse 60   Ht '5\' 8"'  (1.727 m)   Wt (!) 348 lb 6.4 oz (158 kg)   SpO2 93%   BMI 52.97 kg/m  Physical Exam Vitals reviewed.  Constitutional:  General: She is not in acute distress.    Appearance: She is obese. She is not toxic-appearing.     Comments: Super morbidly obese.  Well-groomed.  Well-nourished.  In good spirits.  HENT:     Head: Normocephalic and atraumatic.  Eyes:     Extraocular Movements: Extraocular movements intact.     Pupils: Pupils are equal, round, and reactive to light.  Neck:     Vascular: JVD: Unable to assess due to body habitus.  Cardiovascular:     Rate and Rhythm: Normal rate and regular rhythm.     Chest Wall: PMI displaced: Unable to palpate due to body habitus..  Pulmonary:     Effort: Pulmonary effort is normal.  Musculoskeletal:        General: Swelling (Trace-1+ bilateral ankle swelling.) present.     Cervical back: Normal range of motion and neck supple.  Skin:    General: Skin is warm and dry.  Neurological:      General: No focal deficit present.     Mental Status: She is alert and oriented to person, place, and time.     Gait: Gait abnormal.  Psychiatric:        Mood and Affect: Mood normal.        Behavior: Behavior normal.        Thought Content: Thought content normal.        Judgment: Judgment normal.     Adult ECG Report Not checked  Recent Labs: Reviewed 03/25/2021: PTH 113 (high), BUN 23/CR 1.97 (estimated GFR 27), GLU 103; Hgb 12.6 07/14/2021 Component Ref Range & Units 3 wk ago (07/14/21) 3 mo ago (04/14/21) 9 mo ago (10/14/20)  Hemoglobin A1C 4.0 - 5.6 % 6.0 Abnormal   6.4 Abnormal   6.1 High  R, CM    Lab Results => she is due to get labs checked by PCP soon.-If not we will draw at next visit.  Component Value Date   CHOL 116 05/14/2020   HDL 51.60 05/14/2020   LDLCALC 52 05/14/2020   TRIG 61.0 05/14/2020   CHOLHDL 2 05/14/2020   Lab Results  Component Value Date   CREATININE 1.76 (H) 04/13/2021   BUN 21 04/13/2021   NA 140 04/13/2021   K 4.1 04/13/2021   CL 104 04/13/2021   CO2 31 04/13/2021      Latest Ref Rng & Units 04/13/2021    9:34 AM 01/13/2021    9:10 AM 10/14/2020   10:57 AM  CBC  WBC 4.0 - 10.5 K/uL 4.7   4.1     Hemoglobin 12.0 - 15.0 g/dL 11.9   11.7     Hematocrit 36.0 - 46.0 % 38.7   38.8   32.9    Platelets 150 - 400 K/uL 197   200       Lab Results  Component Value Date   HGBA1C 6.4 (A) 04/14/2021   ==================================================  COVID-19 Education: The signs and symptoms of COVID-19 were discussed with the patient and how to seek care for testing (follow up with PCP or arrange E-visit).    I spent a total of 36 minutes with the patient spent in direct patient consultation.  Additional time spent with chart review  / charting (studies, outside notes, etc): 30 min -> multiple different clinic visits and studies reviewed. Total Time: 66 min  Current medicines are reviewed at length with the patient today.  (+/- concerns)  none  This visit occurred during the SARS-CoV-2 public health  emergency.  Safety protocols were in place, including screening questions prior to the visit, additional usage of staff PPE, and extensive cleaning of exam room while observing appropriate contact time as indicated for disinfecting solutions.  Notice: This dictation was prepared with Dragon dictation along with smart phrase technology. Any transcriptional errors that result from this process are unintentional and may not be corrected upon review.  Studies Ordered:   No orders of the defined types were placed in this encounter.  Meds ordered this encounter  Medications   hydrALAZINE (APRESOLINE) 50 MG tablet    Sig: Take 2 tablets (100 mg total) by mouth 3 (three) times daily.    Dispense:  270 tablet    Refill:  3    Discontinue previous   isosorbide dinitrate (ISORDIL) 20 MG tablet    Sig: Take 2 tablet ( 40 mg total ) by mouth in morning and evening with Hydralazine 100 mg  dose    Dispense:  360 tablet    Refill:  3    New direction, discontinue  previous    Patient Instructions / Medication Changes & Studies & Tests Ordered   Patient Instructions  Medication Instructions:   INCREASE  HYDRALAZINE 100 MG  three times a day  Isosorbide  Dinitrate  40 mg twice a day    *If you need a refill on your cardiac medications before your next appointment, please call your pharmacy*   Lab Work: Not needed    Ask your  Nephrologist  about   adding Spironolactone  if patient can  use medication   can decreas carvedilol to 1/2 tablet  with morning dose.  Testing/Procedures:    Follow-Up: At [facility], you and your health needs are our priority.  As part of our continuing mission to provide you with exceptional heart care, we have created designated Provider Care Teams.  These Care Teams include your primary Cardiologist (physician) and Advanced Practice Providers (APPs -  Physician Assistants and Nurse Practitioners)  who all work together to provide you with the care you need, when you need it.     Your next appointment:   4 TO 5 month(s)  OCT /NOV 2023  The format for your next appointment:   In Person  Provider:   Glenetta Hew, MD      Stacey Page, M.D., M.S. Interventional Cardiologist   Pager # [phone] Phone # [phone] [address] [place],  [phone]   Thank you for choosing Heartcare at [facility]!!

## 2021-08-06 ENCOUNTER — Encounter: Payer: Self-pay | Admitting: Cardiology

## 2021-08-06 ENCOUNTER — Ambulatory Visit (INDEPENDENT_AMBULATORY_CARE_PROVIDER_SITE_OTHER): Payer: No Typology Code available for payment source | Admitting: Cardiology

## 2021-08-06 VITALS — BP 148/78 | HR 60 | Ht 68.0 in | Wt 348.4 lb

## 2021-08-06 DIAGNOSIS — E1169 Type 2 diabetes mellitus with other specified complication: Secondary | ICD-10-CM | POA: Diagnosis not present

## 2021-08-06 DIAGNOSIS — E785 Hyperlipidemia, unspecified: Secondary | ICD-10-CM | POA: Diagnosis not present

## 2021-08-06 DIAGNOSIS — I110 Hypertensive heart disease with heart failure: Secondary | ICD-10-CM | POA: Diagnosis not present

## 2021-08-06 DIAGNOSIS — I5032 Chronic diastolic (congestive) heart failure: Secondary | ICD-10-CM

## 2021-08-06 DIAGNOSIS — G4733 Obstructive sleep apnea (adult) (pediatric): Secondary | ICD-10-CM

## 2021-08-06 DIAGNOSIS — I350 Nonrheumatic aortic (valve) stenosis: Secondary | ICD-10-CM

## 2021-08-06 DIAGNOSIS — J849 Interstitial pulmonary disease, unspecified: Secondary | ICD-10-CM

## 2021-08-06 DIAGNOSIS — Z6841 Body Mass Index (BMI) 40.0 and over, adult: Secondary | ICD-10-CM | POA: Diagnosis not present

## 2021-08-06 DIAGNOSIS — R0609 Other forms of dyspnea: Secondary | ICD-10-CM

## 2021-08-06 DIAGNOSIS — N184 Chronic kidney disease, stage 4 (severe): Secondary | ICD-10-CM

## 2021-08-06 MED ORDER — ISOSORBIDE DINITRATE 20 MG PO TABS: ORAL_TABLET | ORAL | 3 refills | Status: DC

## 2021-08-06 MED ORDER — HYDRALAZINE HCL 50 MG PO TABS: 100.0000 mg | ORAL_TABLET | Freq: Three times a day (TID) | ORAL | 3 refills | Status: DC

## 2021-08-06 NOTE — Assessment & Plan Note (Signed)
Due for her lipids to be checked this summer with PCP.  She has switched over to Dr. Ethelene Hal as PCP  Lipids look well controlled as of April 2022.  Need labs soon.  No longer on GLP-1 agonist-would consider restarting given concerns for obesity. Could also consider SGLT2 inhibitor Wilder Glade has HFpEF indication)

## 2021-08-06 NOTE — Assessment & Plan Note (Signed)
Remains hypertensive with significant obesity leading to a combination of HFpEF along with possible OHS along with OSA.  Continue titrated to hypertensives.  Unfortunately, there is clear evidence of chronotropic incompetence and we probably need to reduce the beta-blocker as opposed to increase the dose.  Plan:  Increase hydralazine to 100 mg 3 times daily and Isordil to 40 mg twice daily for afterload reduction.  Currently on lisinopril, -> if cleared by nephrology, would consider converting to [facility] for HFpEF  Upon standing dose of Lasix but alternating every other day (every other week for days versus 3 days)   Would like to add spironolactone-have asked that she query her nephrologist about the use of spironolactone but but did not mention the Entresto.  But this is also a question from nephrology as well.  Remaining options also include calcium channel blocker-nifedipine/amlodipine.  For additional heart failure management, could consider SGLT2 inhibitor Wilder Glade)

## 2021-08-06 NOTE — Assessment & Plan Note (Signed)
See various between CKD 3B to CKD 4.  Followed by nephrology-Dr. Royce Macadamia.  I have asked that she pass on the message about spironolactone to Dr. Royce Macadamia.  If she does read my note, would also want her to consider the possibility switching to [facility].  Dr. Royce Macadamia is managing her diuretic dosing as well.

## 2021-08-06 NOTE — Assessment & Plan Note (Signed)
No notes from pulmonary medicine since 2016. - consider referral back to Dr. Halford Chessman & Rexene Edison, NP.

## 2021-08-06 NOTE — Assessment & Plan Note (Signed)
She has underlying ILD which does contribute to her dyspnea.  Interestingly the PFTs/spirometry suggestive of restrictive physiology from obesity.  I think she probably needs to continue to follow-up with nephrology

## 2021-08-06 NOTE — Assessment & Plan Note (Signed)
Echo now read as mild to moderate with a gradient of 18 mmHg.  Plan: Recheck in 2 to 3 years.

## 2021-08-06 NOTE — Patient Instructions (Addendum)
Medication Instructions:   INCREASE  HYDRALAZINE 100 MG  three times a day  Isosorbide  Dinitrate  40 mg twice a day    *If you need a refill on your cardiac medications before your next appointment, please call your pharmacy*   Lab Work: Not needed    Ask your  Nephrologist  about   adding Spironolactone  if patient can  use medication   can decreas carvedilol to 1/2 tablet  with morning dose.  Testing/Procedures:    Follow-Up: At [facility], you and your health needs are our priority.  As part of our continuing mission to provide you with exceptional heart care, we have created designated Provider Care Teams.  These Care Teams include your primary Cardiologist (physician) and Advanced Practice Providers (APPs -  Physician Assistants and Nurse Practitioners) who all work together to provide you with the care you need, when you need it.     Your next appointment:   4 TO 5 month(s)  OCT /NOV 2023  The format for your next appointment:   In Person  Provider:   Glenetta Hew, MD

## 2021-08-06 NOTE — Assessment & Plan Note (Signed)
Severe obesity.  Having a hard time losing weight.  She had been on GLP-1 agonist, no longer on it.  Defer to PCP or endocrinology, but would strongly consider restarting.  Recommend referral to healthy weight and wellness, and reiterated [facility].

## 2021-08-11 ENCOUNTER — Ambulatory Visit (INDEPENDENT_AMBULATORY_CARE_PROVIDER_SITE_OTHER): Payer: No Typology Code available for payment source | Admitting: Ophthalmology

## 2021-08-11 ENCOUNTER — Ambulatory Visit (INDEPENDENT_AMBULATORY_CARE_PROVIDER_SITE_OTHER): Payer: No Typology Code available for payment source

## 2021-08-11 DIAGNOSIS — H2513 Age-related nuclear cataract, bilateral: Secondary | ICD-10-CM

## 2021-08-11 DIAGNOSIS — I110 Hypertensive heart disease with heart failure: Secondary | ICD-10-CM

## 2021-08-11 DIAGNOSIS — H35032 Hypertensive retinopathy, left eye: Secondary | ICD-10-CM | POA: Diagnosis not present

## 2021-08-11 DIAGNOSIS — E1169 Type 2 diabetes mellitus with other specified complication: Secondary | ICD-10-CM

## 2021-08-11 DIAGNOSIS — H34822 Venous engorgement, left eye: Secondary | ICD-10-CM

## 2021-08-11 DIAGNOSIS — N184 Chronic kidney disease, stage 4 (severe): Secondary | ICD-10-CM

## 2021-08-11 DIAGNOSIS — G4733 Obstructive sleep apnea (adult) (pediatric): Secondary | ICD-10-CM

## 2021-08-11 DIAGNOSIS — H35031 Hypertensive retinopathy, right eye: Secondary | ICD-10-CM | POA: Diagnosis not present

## 2021-08-11 DIAGNOSIS — E669 Obesity, unspecified: Secondary | ICD-10-CM | POA: Diagnosis not present

## 2021-08-11 DIAGNOSIS — I10 Essential (primary) hypertension: Secondary | ICD-10-CM

## 2021-08-11 DIAGNOSIS — I5032 Chronic diastolic (congestive) heart failure: Secondary | ICD-10-CM

## 2021-08-11 NOTE — Assessment & Plan Note (Signed)
Blood pressure currently under control

## 2021-08-11 NOTE — Assessment & Plan Note (Signed)
The nature of cataract was discussed with the patient as well as the elective nature of surgery. The patient was reassured that surgery at a later date does not put the patient at risk for a worse outcome. It was emphasized that the need for surgery is dictated by the patient's quality of life as influenced by the cataract. Patient was instructed to maintain close follow up with their general eye care doctor.  Sunglasses analogy reviewed.  Patient currently not symptomatic

## 2021-08-11 NOTE — Assessment & Plan Note (Signed)
No detectable diabetic retinopathy in either eye.  Excellent blood sugar control and its importance reviewed with the patientThe nature of diabetic retinopathy was explained using the following analogy: "Retinopathy develops in the body's blood supply like salty water corrodes the linings of pipes in a house, until rust appears, then holes in the pipes develop which leak followed by destruction and loss of the pipes as the corrosion turns them to dust. In a similar fashion, Diabetes damages the blood supply of the body by cumulative long--term elevated blood sugar, which corrodes the blood supply in the body, particularly the blood vessels supplying the retina, kidneys, and nerves".  Thus, control of blood sugar, slows the progression of the corrosive effect of diabetes mellitus.

## 2021-08-11 NOTE — Assessment & Plan Note (Signed)
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  Likely an episode of hypertension in the past.

## 2021-08-11 NOTE — Patient Instructions (Addendum)
Visit Information  Thank you for taking time to visit with me today. Please don't hesitate to contact me if I can be of assistance to you before our next scheduled telephone appointment.  Following are the goals we discussed today:  Continue to take medications as prescribed   Attend all scheduled provider appointments Call pharmacy for medication refills 3-7 days in advance of running out of medications Call provider office for new concerns or questions  weigh daily and record weights.  Notify your doctor if you gain 3 lbs overnight or 5 lbs in a week) We discussed importance of monitoring blood pressure with medication changes.  Please monitor blood pressure daily and record. Continue to follow a low salt diet.  Please be mindful when you are walking and changing elevation level ( curbs, stairs, etc) due to neuropathy Talk with provider regarding need for  ambulatory device.   Our next appointment is by telephone on 09/22/21 at 10:30 am  Please call the care guide team at [phone] if you need to cancel or reschedule your appointment.   If you are experiencing a Mental Health or [place] or need someone to talk to, please call the Suicide and Crisis Lifeline: 988 call 1-800-273-TALK (toll free, 24 hour hotline)   Patient verbalizes understanding of instructions and care plan provided today and agrees to view in [place]. Active MyChart status and patient understanding of how to access instructions and care plan via MyChart confirmed with patient.     Quinn Plowman RN,BSN,CCM RN Case Manager [place] [phone]

## 2021-08-11 NOTE — Progress Notes (Signed)
08/11/2021     CHIEF COMPLAINT Patient presents for  Chief Complaint  Patient presents with   Diabetic Retinopathy without Macular Edema      HISTORY OF PRESENT ILLNESS: Stacey Page is a 71 y.o. female who presents to the clinic today for:   HPI   Diabetic eye exam. Vision works Pt states she has no issue's  She is just here to get her eye's checked.  Last edited by Hurman Horn, MD on 08/11/2021  8:51 AM.      Referring physician: [name], MD [place],  [place] [id number]  HISTORICAL INFORMATION:   Selected notes from the MEDICAL RECORD NUMBER    Lab Results  Component Value Date   HGBA1C 6.0 (A) 07/14/2021     CURRENT MEDICATIONS: No current outpatient medications on file. (Ophthalmic Drugs)   No current facility-administered medications for this visit. (Ophthalmic Drugs)   Current Outpatient Medications (Other)  Medication Sig   albuterol (PROAIR HFA) 108 (90 Base) MCG/ACT inhaler Inhale 1-2 puffs into the lungs every 6 (six) hours as needed for wheezing or shortness of breath.   aspirin 81 MG tablet Take 1 tablet (81 mg total) by mouth daily with breakfast.   atorvastatin (LIPITOR) 20 MG tablet TAKE 1 TABLET BY MOUTH  DAILY AT 6 PM.   Blood Glucose Monitoring Suppl (Frostproof) w/Device KIT 1 kit by Does not apply route daily as needed.   carvedilol (COREG) 25 MG tablet Take 1 tablet (25 mg total) by mouth 2 (two) times daily.   cholecalciferol (VITAMIN D3) 25 MCG (1000 UNIT) tablet Take 2,000 Units by mouth daily.   Continuous Blood Gluc Sensor (FREESTYLE LIBRE SENSOR SYSTEM) MISC Inject 1 kit into the skin daily.   Docusate Sodium 100 MG capsule Take 100 mg by mouth 2 (two) times daily.   ferrous sulfate 325 (65 FE) MG tablet Take 325 mg by mouth 2 (two) times daily with a meal.   fluticasone (FLONASE) 50 MCG/ACT nasal spray INSTILL 1 SPRAY IN EACH NOSTRIL DAILY   furosemide (LASIX) 40 MG tablet TAKE 1  TAB FOUR TIMES PER WEEK, TAKE AN ADDITIONAL TAB FOR WEIGHT GAIN OF 2 LBS OVERNIGHT OR 5 LBS IN ONE WEEK   gabapentin (NEURONTIN) 100 MG capsule Take 1 capsule (100 mg total) by mouth 2 (two) times daily.   glucose blood (ONETOUCH VERIO) test strip 1 each by Other route as needed for other. Use as instructed   hydrALAZINE (APRESOLINE) 50 MG tablet Take 2 tablets (100 mg total) by mouth 3 (three) times daily.   isosorbide dinitrate (ISORDIL) 20 MG tablet Take 2 tablet ( 40 mg total ) by mouth in morning and evening with Hydralazine 100 mg  dose   JANUVIA 25 MG tablet TAKE ONE TABLET BY MOUTH ONCE DAILY   lisinopril (ZESTRIL) 20 MG tablet TAKE 1 TABLET BY MOUTH ONCE DAILY WITH BREAKFAST   montelukast (SINGULAIR) 10 MG tablet Take 1 tablet (10 mg total) by mouth at bedtime.   OneTouch Delica Lancets 19F MISC Use to monitor blood sugars once daily as directed   No current facility-administered medications for this visit. (Other)      REVIEW OF SYSTEMS: ROS   Negative for: Constitutional, Gastrointestinal, Neurological, Skin, Genitourinary, Musculoskeletal, HENT, Endocrine, Cardiovascular, Eyes, Respiratory, Psychiatric, Allergic/Imm, Heme/Lymph Last edited by Orene Desanctis D, CMA on 08/11/2021  8:06 AM.       ALLERGIES No Known Allergies  PAST MEDICAL HISTORY  Past Medical History:  Diagnosis Date   Anxiety    doesn't take any meds   Arthritis of right knee 03/14/2016   Asthma    Back pain    Chronic diastolic CHF (congestive heart failure) (HCC)    HF with Preserved EF (60-65%) - Grade II Diastolic Dysfunction (Hypertensive Heart Disease). takes Furosemide daily   Depression    doesn't take meds   Diabetes ([place])    takes Januvia daily   Eczema    uses cream as needed   GERD (gastroesophageal reflux disease)    History of bronchitis as a child    HTN (hypertension)    Insomnia    Mild aortic stenosis by prior echocardiogram 04/2017   Mild stenosis: Mean gradient 15 mmHg, peak  gradient 28 mmHg   Mycobacterium avium-intracellulare infection ([place]) 2016   OA (osteoarthritis)    Obese    OSA (obstructive sleep apnea) 02/25/2014   wears CPAP at night   Peripheral neuropathy    takes Gabapentin as needed   Pneumonia    hx of-2010   Seasonal allergies    uses Flonase daily   Sleep apnea    Past Surgical History:  Procedure Laterality Date   BREAST BIOPSY Right 03/2018   BREAST LUMPECTOMY WITH RADIOACTIVE SEED LOCALIZATION Right 12/28/2018   Procedure: RIGHT BREAST LUMPECTOMY WITH RADIOACTIVE SEED LOCALIZATION;  Surgeon: Jovita Kussmaul, MD;  Location: [facility];  Service: General;  Laterality: Right;   BREAST SURGERY     CARDIOPULMONARY EXERCISE TEST (CPX)  06/21/2021   (Performed on Coreg 25 mg twice daily): PFTs: FVC 1.76 (60%), FEV1 1.59 (77%), ratio 114% => RESTRICTIVE PHYSIOLOGY; 5 min- 1 mph - 2% grade. 7/10 dyspnea -> pulse ox range during exercise 95 to 98%, low of 90%, 97% on recovery; BP 106/84-160/64; heart rate 64-90 bpm (60% MPHR) -> Chronotropic Incompetence;;MAIN FACTOR for Exercise Limitation = BODY HABITUS w/ Chronotropic Incompetence.   CESAREAN SECTION  x2   COLONOSCOPY     CORONARY CALCIUM SCORE & CT ANGIOGRAM  09/2017   Coronary Ca Score = 11 (low).  CTA- no obstructive CAD (minimal disease)   JOINT REPLACEMENT     KNEE ARTHROSCOPY Right    LUNG BIOPSY Right 06/10/2014   Procedure: LUNG BIOPSY;  Surgeon: Ivin Poot, MD;  Location: [facility];  Service: Thoracic;  Laterality: Right;   POLYPECTOMY     throat   TOTAL KNEE ARTHROPLASTY Right 03/14/2016   Procedure: RIGHT TOTAL KNEE ARTHROPLASTY;  Surgeon: Marybelle Killings, MD;  Location: [facility];  Service: Orthopedics;  Laterality: Right;   TRANSTHORACIC ECHOCARDIOGRAM  11/04/2019   EF 60-65%.  Moderate LVH.  GRII DD.  Mild LV dilation.  Mild LA dilation.  Thickened-calcified aortic valve-Mild AS (mean gradient 13 mmHg, peak 29 mmHg--similar to 2019)   TRANSTHORACIC ECHOCARDIOGRAM  06/14/2021   EF 60 to  65%.  No RWMA.  Moderate cLVH with GR 1 DD.  Mild LA dilation.  Mild to moderate aortic calcification/stenosis.  Mean AVG 18 mmHg.   VIDEO ASSISTED THORACOSCOPY Right 06/10/2014   Procedure: VIDEO ASSISTED THORACOSCOPY;  Surgeon: Ivin Poot, MD;  [facility];  Service: Thoracic;  Laterality: Right;    FAMILY HISTORY Family History  Problem Relation Age of Onset   COPD Father    Hypothyroidism Father    Anemia Father        iron deficiency   High blood pressure Father    Alcoholism Father    Cancer Mother  85       pancreatic   High blood pressure Mother    High Cholesterol Mother    Breast cancer Neg Hx     SOCIAL HISTORY Social History   Tobacco Use   Smoking status: Former    Packs/day: 0.25    Years: 15.00    Total pack years: 3.75    Types: Cigarettes    Quit date: 02/08/1980    Years since quitting: 41.5   Smokeless tobacco: Former  Scientific laboratory technician Use: Never used  Substance Use Topics   Alcohol use: Yes    Alcohol/week: 0.0 standard drinks of alcohol    Comment: occassional/social/rare   Drug use: Not Currently         OPHTHALMIC EXAM:  Base Eye Exam     Visual Acuity (ETDRS)       Right Left   Dist  20/20 +2 20/25         Tonometry (Tonopen, 8:18 AM)       Right Left   Pressure 9 8         Pupils       Pupils   Right PERRL   Left PERRL         Visual Fields       Left Right    Full Full         Extraocular Movement       Right Left    Full Full         Neuro/Psych     Oriented x3: Yes         Dilation     Both eyes:            Slit Lamp and Fundus Exam     External Exam       Right Left   External Normal Normal         Slit Lamp Exam       Right Left   Lids/Lashes Normal Normal   Conjunctiva/Sclera White and quiet White and quiet   Cornea Clear Clear   Anterior Chamber Deep and quiet Deep and quiet   Iris Round and reactive Round and reactive   Lens 1+ Nuclear sclerosis 1+  Nuclear sclerosis   Anterior Vitreous Normal Normal         Fundus Exam       Right Left   Posterior Vitreous Normal Normal   Disc Normal Normal   C/D Ratio 0.0 0.0   Macula Normal, no clinically significant macular edema, no hemorrhage, no macular thickening, no microaneurysms Normal, no clinically significant macular edema, no hemorrhage, no macular thickening, no microaneurysms   Vessels AV nicking, Arteriolar narrowing engorged central vein, AV nicking, Arteriolar narrowing   Periphery Normal Normal            IMAGING AND PROCEDURES  Imaging and Procedures for 08/11/21  OCT, Retina - OU - Both Eyes       Right Eye Quality was good. Scan locations included subfoveal. Central Foveal Thickness: 278. Progression has no prior data. Findings include normal foveal contour.   Left Eye Quality was good. Scan locations included subfoveal. Central Foveal Thickness: 271. Findings include abnormal foveal contour.   Notes OD normal thickness, to dental PVD  OS has a wedge-shaped region of atrophy inferonasal to the fovea.  This is suggestive of previous not active currently, branch retinal artery occlusion.  There is no plaque seen on examination clinically     Color  Fundus Photography Optos - OU - Both Eyes       OD clear media, normal vasculature slightly engorged retinal vein diffusely, no sign of diabetic retinopathy  OS no sign of detectable diabetic retinopathy.  Also with diffuse arterial venous changes, with narrowing of the artery, AV nicking, suggestive of grade 1 hypertensive retinopathy.               ASSESSMENT/PLAN:  OSA (obstructive sleep apnea) Patient reports excellent compliance with CPAP discussed the importance.  Diabetes mellitus type 2 in obese (HCC) No detectable diabetic retinopathy in either eye.  Excellent blood sugar control and its importance reviewed with the patientThe nature of diabetic retinopathy was explained using the following  analogy: "Retinopathy develops in the body's blood supply like salty water corrodes the linings of pipes in a house, until rust appears, then holes in the pipes develop which leak followed by destruction and loss of the pipes as the corrosion turns them to dust. In a similar fashion, Diabetes damages the blood supply of the body by cumulative long--term elevated blood sugar, which corrodes the blood supply in the body, particularly the blood vessels supplying the retina, kidneys, and nerves".  Thus, control of blood sugar, slows the progression of the corrosive effect of diabetes mellitus.     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  Likely an episode of hypertension in the past.  Nuclear sclerotic cataract of both eyes The nature of cataract was discussed with the patient as well as the elective nature of surgery. The patient was reassured that surgery at a later date does not put the patient at risk for a worse outcome. It was emphasized that the need for surgery is dictated by the patient's quality of life as influenced by the cataract. Patient was instructed to maintain close follow up with their general eye care doctor.  Sunglasses analogy reviewed.  Patient currently not symptomatic     ICD-10-CM   1. Hypertensive retinopathy of right eye, grade 1  H35.031 OCT, Retina - OU - Both Eyes    Color Fundus Photography Optos - OU - Both Eyes    2. Incipient occlusion of retinal vein of left eye  H34.822 OCT, Retina - OU - Both Eyes    Color Fundus Photography Optos - OU - Both Eyes    3. OSA (obstructive sleep apnea)  G47.33     4. Diabetes mellitus type 2 in obese (HCC)  E11.69    E66.9     5. Grade 2 hypertensive retinopathy, left  H35.032     6. Nuclear sclerotic cataract of both eyes  H25.13       1.  Patient  continues with good blood sugar control and excellent hypertension control.  2.  Excellent compliance with CPAP will be protective for her eyes as well as remainder of her CNS and cardiovascular systems  3.  Ophthalmic Meds Ordered this visit:  No orders of the defined types were placed in this encounter.      Return in about 9 months (around 05/13/2022) for DILATE OU, COLOR FP, OCT.  There are no Patient Instructions on file for this visit.   Explained the diagnoses, plan, and follow up with the patient and they expressed understanding.  Patient expressed understanding of the importance of proper follow up care.   Clent Demark Lavera Vandermeer M.D.  Diseases & Surgery of the Retina and Vitreous Retina & Diabetic [place] 08/11/21     Abbreviations: M myopia (nearsighted); A astigmatism; H hyperopia (farsighted); P presbyopia; Mrx spectacle prescription;  CTL contact lenses; OD right eye; OS left eye; OU both eyes  XT exotropia; ET esotropia; PEK punctate epithelial keratitis; PEE punctate epithelial erosions; DES dry eye syndrome; MGD meibomian gland dysfunction; ATs artificial tears; PFAT's preservative free artificial tears; [place] nuclear sclerotic cataract; PSC posterior subcapsular cataract; ERM epi-retinal membrane; PVD posterior vitreous detachment; RD retinal detachment; DM diabetes mellitus; DR diabetic retinopathy; NPDR non-proliferative diabetic retinopathy; PDR proliferative diabetic retinopathy; CSME clinically significant macular edema; DME diabetic macular edema; dbh dot blot hemorrhages; CWS cotton wool spot; POAG primary open angle glaucoma; C/D cup-to-disc ratio; HVF humphrey visual field; GVF goldmann visual field; OCT optical coherence tomography; IOP intraocular pressure; BRVO Branch retinal vein occlusion; CRVO central retinal vein occlusion; CRAO central retinal artery occlusion; BRAO branch retinal artery occlusion; RT retinal tear; SB scleral buckle; PPV pars plana vitrectomy; VH  Vitreous hemorrhage; PRP panretinal laser photocoagulation; IVK intravitreal kenalog; VMT vitreomacular traction; MH Macular hole;  NVD neovascularization of the disc; NVE neovascularization elsewhere; AREDS age related eye disease study; ARMD age related macular degeneration; POAG primary open angle glaucoma; EBMD epithelial/anterior basement membrane dystrophy; ACIOL anterior chamber intraocular lens; IOL intraocular lens; PCIOL posterior chamber intraocular lens; Phaco/IOL phacoemulsification with intraocular lens placement; [place] photorefractive keratectomy; LASIK laser assisted in situ keratomileusis; HTN hypertension; DM diabetes mellitus; COPD chronic obstructive pulmonary disease

## 2021-08-11 NOTE — Chronic Care Management (AMB) (Addendum)
Chronic Care Management   CCM RN Visit Note  08/11/2021 Name: Stacey Page MRN: [id number] DOB: 01/30/1951  Subjective: Stacey Page is a 71 y.o. year old female who is a primary care patient of [name], MD. The care management team was consulted for assistance with disease management and care coordination needs.    Engaged with patient by telephone for follow up visit in response to provider referral for case management and/or care coordination services.   Consent to Services:  The patient was given information about Chronic Care Management services, agreed to services, and gave verbal consent prior to initiation of services.  Please see initial visit note for detailed documentation.   Patient agreed to services and verbal consent obtained.   Assessment: Review of patient past medical history, allergies, medications, health status, including review of consultants reports, laboratory and other test data, was performed as part of comprehensive evaluation and provision of chronic care management services.   SDOH (Social Determinants of Health) assessments and interventions performed:    CCM Care Plan  No Known Allergies  Outpatient Encounter Medications as of 08/11/2021  Medication Sig   albuterol (PROAIR HFA) 108 (90 Base) MCG/ACT inhaler Inhale 1-2 puffs into the lungs every 6 (six) hours as needed for wheezing or shortness of breath.   aspirin 81 MG tablet Take 1 tablet (81 mg total) by mouth daily with breakfast.   atorvastatin (LIPITOR) 20 MG tablet TAKE 1 TABLET BY MOUTH  DAILY AT 6 PM.   Blood Glucose Monitoring Suppl ([place]) w/Device KIT 1 kit by Does not apply route daily as needed.   carvedilol (COREG) 25 MG tablet Take 1 tablet (25 mg total) by mouth 2 (two) times daily.   cholecalciferol (VITAMIN D3) 25 MCG (1000 UNIT) tablet Take 2,000 Units by mouth daily.   Continuous Blood Gluc Sensor (FREESTYLE LIBRE SENSOR SYSTEM) MISC Inject 1  kit into the skin daily.   Docusate Sodium 100 MG capsule Take 100 mg by mouth 2 (two) times daily.   ferrous sulfate 325 (65 FE) MG tablet Take 325 mg by mouth 2 (two) times daily with a meal.   fluticasone (FLONASE) 50 MCG/ACT nasal spray INSTILL 1 SPRAY IN EACH NOSTRIL DAILY   furosemide (LASIX) 40 MG tablet TAKE 1 TAB FOUR TIMES PER WEEK, TAKE AN ADDITIONAL TAB FOR WEIGHT GAIN OF 2 LBS OVERNIGHT OR 5 LBS IN ONE WEEK   gabapentin (NEURONTIN) 100 MG capsule Take 1 capsule (100 mg total) by mouth 2 (two) times daily.   glucose blood (ONETOUCH VERIO) test strip 1 each by Other route as needed for other. Use as instructed   hydrALAZINE (APRESOLINE) 50 MG tablet Take 2 tablets (100 mg total) by mouth 3 (three) times daily.   isosorbide dinitrate (ISORDIL) 20 MG tablet Take 2 tablet ( 40 mg total ) by mouth in morning and evening with Hydralazine 100 mg  dose   JANUVIA 25 MG tablet TAKE ONE TABLET BY MOUTH ONCE DAILY   lisinopril (ZESTRIL) 20 MG tablet TAKE 1 TABLET BY MOUTH ONCE DAILY WITH BREAKFAST   montelukast (SINGULAIR) 10 MG tablet Take 1 tablet (10 mg total) by mouth at bedtime.   OneTouch Delica Lancets 27P MISC Use to monitor blood sugars once daily as directed   No facility-administered encounter medications on file as of 08/11/2021.    Patient Active Problem List   Diagnosis Date Noted   Hypertensive retinopathy of right eye, grade 1 08/11/2021   Grade 2  hypertensive retinopathy, left 08/11/2021   Nuclear sclerotic cataract of both eyes 08/11/2021   Need for shingles vaccine 07/14/2021   Iron deficiency anemia 01/13/2021   CKD (chronic kidney disease) stage 4, GFR 15-29 ml/min ([place]) 07/09/2019   Diabetes mellitus type 2 in obese ([place]) 07/02/2019   Polyneuropathy associated with underlying disease ([place]) 11/28/2018   Severe episode of recurrent major depressive disorder, without psychotic features ([place]) 11/28/2018   Type 2 diabetes mellitus with diabetic peripheral angiopathy without  gangrene, without long-term current use of insulin ([place]) 11/28/2018   Poor short term memory 11/28/2018   Mild aortic stenosis by prior echocardiogram 11/06/2018   Diabetes mellitus ([place]) 03/21/2017   Symptomatic anemia 03/17/2016   S/P total knee arthroplasty, right 03/17/2016   Unilateral primary osteoarthritis, right knee    Hyperlipidemia associated with type 2 diabetes mellitus ([place]) 12/15/2015   Pain in both lower extremities 11/19/2015   Numbness in both hands 08/13/2015   Panic attack 06/09/2015   Right knee pain 05/14/2015   Chronic renal insufficiency 09/16/2014   Pneumothorax on right    Atypical mycobacterium infection    ILD (interstitial lung disease) 2/2 MAI s/p med rxn    Class 3 severe obesity with serious comorbidity and body mass index (BMI) of 50.0 to 59.9 in adult ([place]) 04/03/2014   OSA (obstructive sleep apnea) 02/25/2014   Hypertensive heart disease with chronic diastolic congestive heart failure ([place]) 02/25/2014   DOE (dyspnea on exertion) 02/25/2014   Hypertension associated with diabetes ([place]) 02/25/2014    Conditions to be addressed/monitored:CHF, HTN, CKD Stage III, and neuropathy  Care Plan : [facility] plan of care  Updates made by Dannielle Karvonen, RN since 08/11/2021 12:00 AM     Problem: Chronic disease management education and care coordination needs.   Priority: High     Long-Range Goal: Development of plan of chronic care to address chronic disease management and care coodination needs.   Start Date: 03/24/2021  Expected End Date: 10/07/2021  Priority: High  Note:   Current Barriers:  Knowledge Deficits related to plan of care for management of CHF, HTN, and CKD Stage IV  Patient reports having follow up with Retina specialist today.  No new concerns. Follow up in 9 months.   Patient reports having cardiology visit on 08/06/21.  She states her Hydralazine and Isordil was increased.  She states she continues to take all other medications as prescribed.   Patient states she has arranged with [facility] pharmacist to use [facility] as medication provider.  Patient reports seeing primary care provider on 07/22/21.  Per chart review blood pressure reading at time of office visit 179/80.  Free style libre ordered.  She states she is scheduled to meet with embedded pharmacist on 08/26/21 to have Free style libre device applied.  Per chart review Hgb A1c on 07/14/21 at goal 6.0.  Patient states her primary care provider decreased her Januvia to 25 mg/ QD.   Patient states she is still not checking her blood sugars, blood pressure or weight.  She states she plans to start checking and keeping record.  Patient states she continues to follow up with weight management program. She reports ongoing burning in her feet due to neuropathy. Denies any falls. Patient states diabetic shoes are being ordered for her.  Patient reports having upcoming follow up with nephrologist.     RNCM Clinical Goal(s):  Patient will verbalize understanding of plan for management of CHF, HTN, and CKD Stage IV as evidenced by  patient self report and/ or notation in chart take all medications exactly as prescribed and will call provider for medication related questions as evidenced by patient self report and/  or notation in chart    attend all scheduled medical appointments:  as evidenced by patient self report and / or notation in chart        continue to work with RN Care Manager and/or Social Worker to address care management and care coordination needs related to CHF, HTN, and CKD Stage IV as evidenced by adherence to CM Team Scheduled appointments    through collaboration with Consulting civil engineer, provider, and care team.   Interventions: 1:1 collaboration with primary care provider regarding development and update of comprehensive plan of care as evidenced by provider attestation and co-signature Inter-disciplinary care team collaboration (see longitudinal plan of care) Evaluation of current  treatment plan related to  self management and patient's adherence to plan as established by provider  Neuropathy Interventions:  ( Status: Goal on track:  Yes)  Long term goal  Discussed fall precautions / safety with patient Advised patient to re- discuss need for ambulatory device with provider.  Advised patient to be mindful when changing elevations when walking  Heart Failure Interventions:  (Status:  Goal on track:  Yes.) Long Term Goal Discussed importance of following low sodium diet Reviewed Heart Failure Action Plan and zones Encouraged patient to weigh daily and record. Advised to notify her provider for weight gain of 3 lbs overnight or 5 lbs in a week.  Discussed the importance of keeping all appointments with provider Reviewed medications and discussed compliance.  Reviewed heart failure symptoms.    Chronic Kidney Disease Interventions:  (Status:  Goal on track:  Yes.) Long Term Goal Evaluation of current treatment plan related to chronic kidney disease self management and patient's adherence to plan as established by provider      Reviewed medications with patient and discussed importance of compliance    Reviewed scheduled/upcoming provider appointments   Discussed plans with patient for ongoing care management follow up and provided patient with direct contact information for care management team    Advised to keep follow up visit with nephrologist.  Last practice recorded BP readings:  BP Readings from Last 3 Encounters:  08/06/21 (!) 148/78  07/22/21 (!) 179/80  07/19/21 (!) 144/67  Most recent eGFR/CrCl:  Lab Results  Component Value Date   EGFR 29 06/25/2021    No components found for: "CRCL"   Hypertension Interventions:  (Status:  Goal on track:  Yes.) Long Term Goal Last practice recorded BP readings:  BP Readings from Last 3 Encounters:  08/06/21 (!) 148/78  07/22/21 (!) 179/80  07/19/21 (!) 144/67  Most recent eGFR/CrCl:  Lab Results  Component  Value Date   EGFR 29 06/25/2021    No components found for: "CRCL"  Evaluation of current treatment plan related to hypertension self management and patient's adherence to plan as established by provider Reviewed medications adjustments with patient and discussed importance of compliance Discussed plans with patient for ongoing care management follow up and provided patient with direct contact information for care management team Reviewed scheduled/upcoming provider appointments  Advised patient to continue to follow low sodium diet.  Advised to monitor blood pressure at least 2-3 times per week  Advised to continue to follow low salt diet.   Patient Goals/Self-Care Activities: Continue to take medications as prescribed   Attend all scheduled provider appointments Call pharmacy for medication refills 3-7  days in advance of running out of medications Call provider office for new concerns or questions  weigh daily and record weights.  Notify your doctor if you gain 3 lbs overnight or 5 lbs in a week) We discussed importance of monitoring blood pressure with medication changes.  Please monitor blood pressure daily and record. Continue to follow a low salt diet.  Please be mindful when you are walking and changing elevation level ( curbs, stairs, etc) due to neuropathy Talk with provider regarding need for  ambulatory device.        Plan:The patient has been provided with contact information for the care management team and has been advised to call with any health related questions or concerns.  The care management team will reach out to the patient again over the next 45 days. Quinn Plowman RN,BSN,CCM RN Case Manager [place] [phone]

## 2021-08-11 NOTE — Assessment & Plan Note (Signed)
Patient reports excellent compliance with CPAP discussed the importance.

## 2021-08-14 DIAGNOSIS — G4733 Obstructive sleep apnea (adult) (pediatric): Secondary | ICD-10-CM | POA: Diagnosis not present

## 2021-08-17 ENCOUNTER — Ambulatory Visit (INDEPENDENT_AMBULATORY_CARE_PROVIDER_SITE_OTHER): Payer: No Typology Code available for payment source

## 2021-08-17 DIAGNOSIS — Z0000 Encounter for general adult medical examination without abnormal findings: Secondary | ICD-10-CM

## 2021-08-17 NOTE — Progress Notes (Signed)
Subjective:   Stacey Page is a 71 y.o. female who presents for Medicare Annual (Subsequent) preventive examination.    I connected with [name]  today by telephone and verified that I am speaking with the correct person using two identifiers. Location patient: home Location provider: work Persons participating in the virtual visit: patient, provider.   I discussed the limitations, risks, security and privacy concerns of performing an evaluation and management service by telephone and the availability of in person appointments. I also discussed with the patient that there may be a patient responsible charge related to this service. The patient expressed understanding and verbally consented to this telephonic visit.    Interactive audio and video telecommunications were attempted between this provider and patient, however failed, due to patient having technical difficulties OR patient did not have access to video capability.  We continued and completed visit with audio only.    Review of Systems     Cardiac Risk Factors include: advanced age (>34mn, >>61women)     Objective:    Today's Vitals   There is no height or weight on file to calculate BMI.     08/17/2021   11:14 AM 11/16/2020   11:31 AM 10/21/2020   10:10 AM 08/04/2020   11:27 AM 07/31/2019    8:13 AM 12/24/2018   10:09 AM 07/04/2018    8:20 AM  Advanced Directives  Does Patient Have a Medical Advance Directive? No No No No No No No  Would patient like information on creating a medical advance directive? No - Patient declined No - Patient declined No - Patient declined No - Patient declined No - Patient declined No - Patient declined No - Patient declined    Current Medications (verified) Outpatient Encounter Medications as of 08/17/2021  Medication Sig   albuterol (PROAIR HFA) 108 (90 Base) MCG/ACT inhaler Inhale 1-2 puffs into the lungs every 6 (six) hours as needed for wheezing or shortness of breath.    aspirin 81 MG tablet Take 1 tablet (81 mg total) by mouth daily with breakfast.   atorvastatin (LIPITOR) 20 MG tablet TAKE 1 TABLET BY MOUTH  DAILY AT 6 PM.   Blood Glucose Monitoring Suppl ([place] w/Device KIT 1 kit by Does not apply route daily as needed.   carvedilol (COREG) 25 MG tablet Take 1 tablet (25 mg total) by mouth 2 (two) times daily.   cholecalciferol (VITAMIN D3) 25 MCG (1000 UNIT) tablet Take 2,000 Units by mouth daily.   Continuous Blood Gluc Sensor (FREESTYLE LIBRE SENSOR SYSTEM) MISC Inject 1 kit into the skin daily.   Docusate Sodium 100 MG capsule Take 100 mg by mouth 2 (two) times daily.   ferrous sulfate 325 (65 FE) MG tablet Take 325 mg by mouth 2 (two) times daily with a meal.   fluticasone (FLONASE) 50 MCG/ACT nasal spray INSTILL 1 SPRAY IN EACH NOSTRIL DAILY   furosemide (LASIX) 40 MG tablet TAKE 1 TAB FOUR TIMES PER WEEK, TAKE AN ADDITIONAL TAB FOR WEIGHT GAIN OF 2 LBS OVERNIGHT OR 5 LBS IN ONE WEEK   gabapentin (NEURONTIN) 100 MG capsule Take 1 capsule (100 mg total) by mouth 2 (two) times daily.   glucose blood (ONETOUCH VERIO) test strip 1 each by Other route as needed for other. Use as instructed   hydrALAZINE (APRESOLINE) 50 MG tablet Take 2 tablets (100 mg total) by mouth 3 (three) times daily.   isosorbide dinitrate (ISORDIL) 20 MG tablet Take 2 tablet (  40 mg total ) by mouth in morning and evening with Hydralazine 100 mg  dose   JANUVIA 25 MG tablet TAKE ONE TABLET BY MOUTH ONCE DAILY   lisinopril (ZESTRIL) 20 MG tablet TAKE 1 TABLET BY MOUTH ONCE DAILY WITH BREAKFAST   montelukast (SINGULAIR) 10 MG tablet Take 1 tablet (10 mg total) by mouth at bedtime.   OneTouch Delica Lancets 32T MISC Use to monitor blood sugars once daily as directed   No facility-administered encounter medications on file as of 08/17/2021.    Allergies (verified) Patient has no known allergies.   History: Past Medical History:  Diagnosis Date   Anxiety     doesn't take any meds   Arthritis of right knee 03/14/2016   Asthma    Back pain    Chronic diastolic CHF (congestive heart failure) (HCC)    HF with Preserved EF (60-65%) - Grade II Diastolic Dysfunction (Hypertensive Heart Disease). takes Furosemide daily   Depression    doesn't take meds   Diabetes ([place])    takes Januvia daily   Eczema    uses cream as needed   GERD (gastroesophageal reflux disease)    History of bronchitis as a child    HTN (hypertension)    Insomnia    Mild aortic stenosis by prior echocardiogram 04/2017   Mild stenosis: Mean gradient 15 mmHg, peak gradient 28 mmHg   Mycobacterium avium-intracellulare infection ([place]) 2016   OA (osteoarthritis)    Obese    OSA (obstructive sleep apnea) 02/25/2014   wears CPAP at night   Peripheral neuropathy    takes Gabapentin as needed   Pneumonia    hx of-2010   Seasonal allergies    uses Flonase daily   Sleep apnea    Past Surgical History:  Procedure Laterality Date   BREAST BIOPSY Right 03/2018   BREAST LUMPECTOMY WITH RADIOACTIVE SEED LOCALIZATION Right 12/28/2018   Procedure: RIGHT BREAST LUMPECTOMY WITH RADIOACTIVE SEED LOCALIZATION;  Surgeon: Jovita Kussmaul, MD;  Location: [facility];  Service: General;  Laterality: Right;   BREAST SURGERY     CARDIOPULMONARY EXERCISE TEST (CPX)  06/21/2021   (Performed on Coreg 25 mg twice daily): PFTs: FVC 1.76 (60%), FEV1 1.59 (77%), ratio 114% => RESTRICTIVE PHYSIOLOGY; 5 min- 1 mph - 2% grade. 7/10 dyspnea -> pulse ox range during exercise 95 to 98%, low of 90%, 97% on recovery; BP 106/84-160/64; heart rate 64-90 bpm (60% MPHR) -> Chronotropic Incompetence;;MAIN FACTOR for Exercise Limitation = BODY HABITUS w/ Chronotropic Incompetence.   CESAREAN SECTION  x2   COLONOSCOPY     CORONARY CALCIUM SCORE & CT ANGIOGRAM  09/2017   Coronary Ca Score = 11 (low).  CTA- no obstructive CAD (minimal disease)   JOINT REPLACEMENT     KNEE ARTHROSCOPY Right    LUNG BIOPSY Right 06/10/2014    Procedure: LUNG BIOPSY;  Surgeon: Ivin Poot, MD;  Location: [facility];  Service: Thoracic;  Laterality: Right;   POLYPECTOMY     throat   TOTAL KNEE ARTHROPLASTY Right 03/14/2016   Procedure: RIGHT TOTAL KNEE ARTHROPLASTY;  Surgeon: Marybelle Killings, MD;  Location: [place] [place];  Service: Orthopedics;  Laterality: Right;   TRANSTHORACIC ECHOCARDIOGRAM  11/04/2019   EF 60-65%.  Moderate LVH.  GRII DD.  Mild LV dilation.  Mild LA dilation.  Thickened-calcified aortic valve-Mild AS (mean gradient 13 mmHg, peak 29 mmHg--similar to 2019)   TRANSTHORACIC ECHOCARDIOGRAM  06/14/2021   EF 60 to 65%.  No RWMA.  Moderate cLVH with GR 1 DD.  Mild LA dilation.  Mild to moderate aortic calcification/stenosis.  Mean AVG 18 mmHg.   VIDEO ASSISTED THORACOSCOPY Right 06/10/2014   Procedure: VIDEO ASSISTED THORACOSCOPY;  Surgeon: Ivin Poot, MD;  Location: [facility] OR;  Service: Thoracic;  Laterality: Right;   Family History  Problem Relation Age of Onset   COPD Father    Hypothyroidism Father    Anemia Father        iron deficiency   High blood pressure Father    Alcoholism Father    Cancer Mother 51       pancreatic   High blood pressure Mother    High Cholesterol Mother    Breast cancer Neg Hx    Social History   Socioeconomic History   Marital status: Widowed    Spouse name: Not on file   Number of children: Not on file   Years of education: Not on file   Highest education level: Not on file  Occupational History   Occupation: retired  Tobacco Use   Smoking status: Former    Packs/day: 0.25    Years: 15.00    Total pack years: 3.75    Types: Cigarettes    Quit date: 02/08/1980    Years since quitting: 41.5   Smokeless tobacco: Former  Scientific laboratory technician Use: Never used  Substance and Sexual Activity   Alcohol use: Yes    Alcohol/week: 0.0 standard drinks of alcohol    Comment: occassional/social/rare   Drug use: Not Currently   Sexual activity: Not Currently  Other Topics Concern   Not  on file  Social History Narrative   Not on file   Social Determinants of Health   Financial Resource Strain: Low Risk  (08/17/2021)   Overall Financial Resource Strain (CARDIA)    Difficulty of Paying Living Expenses: Not hard at all  Food Insecurity: No Food Insecurity (08/17/2021)   Hunger Vital Sign    Worried About Running Out of Food in the Last Year: Never true    Ran Out of Food in the Last Year: Never true  Transportation Needs: No Transportation Needs (08/17/2021)   PRAPARE - Hydrologist (Medical): No    Lack of Transportation (Non-Medical): No  Physical Activity: Insufficiently Active (08/17/2021)   Exercise Vital Sign    Days of Exercise per Week: 3 days    Minutes of Exercise per Session: 30 min  Stress: No Stress Concern Present (08/17/2021)   [place]    Feeling of Stress : Not at all  Social Connections: Socially Isolated (08/17/2021)   Social Connection and Isolation Panel [NHANES]    Frequency of Communication with Friends and Family: Three times a week    Frequency of Social Gatherings with Friends and Family: Three times a week    Attends Religious Services: Never    Active Member of Clubs or Organizations: No    Attends Archivist Meetings: Never    Marital Status: Widowed    Tobacco Counseling Counseling given: Not Answered   Clinical Intake:  Pre-visit preparation completed: Yes  Pain : No/denies pain     Nutritional Risks: None Diabetes: No  How often do you need to have someone help you when you read instructions, pamphlets, or other written materials from your doctor or pharmacy?: 1 - Never What is the last grade level you completed in school?: High School  Diabetic?yes  Nutrition Risk Assessment:  Has the patient had any N/V/D within the last 2 months?  No  Does the patient have any non-healing wounds?  No  Has the patient had any  unintentional weight loss or weight gain?  No   Diabetes:  Is the patient diabetic?  Yes  If diabetic, was a CBG obtained today?  No  Did the patient bring in their glucometer from home?  No  How often do you monitor your CBG's? Once a day .   Financial Strains and Diabetes Management:  Are you having any financial strains with the device, your supplies or your medication? No .  Does the patient want to be seen by Chronic Care Management for management of their diabetes?  No  Would the patient like to be referred to a Nutritionist or for Diabetic Management?  No   Diabetic Exams:  Diabetic Eye Exam: Completed 07/2021 Diabetic Foot Exam: Overdue, Pt has been advised about the importance in completing this exam. Pt is scheduled for diabetic foot exam on next office visit .   Interpreter Needed?: No  Information entered by :: [email or web address]   Activities of Daily Living    08/17/2021   11:19 AM  In your present state of health, do you have any difficulty performing the following activities:  Hearing? 0  Vision? 0  Difficulty concentrating or making decisions? 0  Walking or climbing stairs? 0  Dressing or bathing? 0  Doing errands, shopping? 0  Preparing Food and eating ? N  Using the Toilet? N  In the past six months, have you accidently leaked urine? N  Do you have problems with loss of bowel control? N  Managing your Medications? N  Managing your Finances? N  Housekeeping or managing your Housekeeping? N    Patient Care Team: [name], MD as PCP - General (Family Medicine) Leonie Man, MD as PCP - Cardiology (Cardiology) Germaine Pomfret, [facility] as Pharmacist (Pharmacist) Dannielle Karvonen, RN as Case Manager  Indicate any recent Medical Services you may have received from other than [facility] providers in the past year (date may be approximate).     Assessment:   This is a routine wellness examination for Stacey Page.  Hearing/Vision screen Vision  Screening - Comments:: Annual eye exams wear glasses   Dietary issues and exercise activities discussed: Current Exercise Habits: Home exercise routine, Type of exercise: walking, Time (Minutes): 30, Frequency (Times/Week): 3, Weekly Exercise (Minutes/Week): 90, Intensity: Mild, Exercise limited by: neurologic condition(s)   Goals Addressed   None    Depression Screen    08/17/2021   11:16 AM 08/17/2021   11:12 AM 07/22/2021    1:18 PM 07/14/2021    9:09 AM 07/14/2021    8:24 AM 10/21/2020   10:19 AM 08/04/2020   11:39 AM  PHQ 2/9 Scores  PHQ - 2 Score 0 0 0 0 0 1 0  PHQ- 9 Score    0       Fall Risk    08/17/2021   11:16 AM 07/22/2021    1:18 PM 07/14/2021    8:25 AM 03/24/2021   10:48 AM 10/21/2020   10:19 AM  Fall Risk   Falls in the past year? 0 0 0 0 0  Number falls in past yr: 0 0 0 0 0  Injury with Fall? 0 0  0   Follow up Falls evaluation completed;Education provided        [place]  TO THE HOME:  Any stairs in or around the home? No  If so, are there any without handrails? No  Home free of loose throw rugs in walkways, pet beds, electrical cords, etc? Yes  Adequate lighting in your home to reduce risk of falls? Yes   ASSISTIVE DEVICES UTILIZED TO PREVENT FALLS:  Life alert? No  Use of a cane, walker or w/c? No  Grab bars in the bathroom? No  Shower chair or bench in shower? No  Elevated toilet seat or a handicapped toilet? No     Cognitive Function:  Normal cognitive status assessed by telephone conversation  by this Nurse Health Advisor. No abnormalities found.      11/28/2018   11:19 AM  MMSE - Mini Mental State Exam  Orientation to time 5  Orientation to Place 5  Registration 3  Attention/ Calculation 5  Recall 3  Language- name 2 objects 2  Language- repeat 1  Language- follow 3 step command 3  Language- read & follow direction 1  Write a sentence 1  Copy design 1  Total score 30        07/31/2019    8:34 AM  6CIT  Screen  What Year? 0 points  What month? 0 points  What time? 0 points  Count back from 20 0 points  Months in reverse 0 points  Repeat phrase 0 points  Total Score 0 points    Immunizations Immunization History  Administered Date(s) Administered   Fluad Quad(high Dose 65+) 11/28/2018, 01/29/2020   Influenza Split 11/21/2016   Influenza,inj,Quad PF,6+ Mos 01/12/2015, 11/19/2015   PFIZER(Purple Top)SARS-COV-2 Vaccination 03/31/2019, 04/23/2019, 03/21/2020   Pneumococcal Conjugate-13 01/19/2017   Pneumococcal Polysaccharide-23 01/12/2015, 05/14/2020   Zoster Recombinat (Shingrix) 07/14/2021    TDAP status: Due, Education has been provided regarding the importance of this vaccine. Advised may receive this vaccine at local pharmacy or Health Dept. Aware to provide a copy of the vaccination record if obtained from local pharmacy or Health Dept. Verbalized acceptance and understanding.  Flu Vaccine status: Due, Education has been provided regarding the importance of this vaccine. Advised may receive this vaccine at local pharmacy or Health Dept. Aware to provide a copy of the vaccination record if obtained from local pharmacy or Health Dept. Verbalized acceptance and understanding.  Pneumococcal vaccine status: Up to date  Covid-19 vaccine status: Completed vaccines  Qualifies for Shingles Vaccine? Yes   Zostavax completed No   Shingrix Completed?: No.    Education has been provided regarding the importance of this vaccine. Patient has been advised to call insurance company to determine out of pocket expense if they have not yet received this vaccine. Advised may also receive vaccine at local pharmacy or Health Dept. Verbalized acceptance and understanding.  Screening Tests Health Maintenance  Topic Date Due   MAMMOGRAM  07/14/2021   TETANUS/TDAP  07/15/2022 (Originally 02/07/2017)   INFLUENZA VACCINE  09/07/2021   Zoster Vaccines- Shingrix (2 of 2) 09/08/2021   OPHTHALMOLOGY EXAM   11/05/2021   HEMOGLOBIN A1C  01/13/2022   FOOT EXAM  07/23/2022   Fecal DNA (Cologuard)  11/12/2023   Pneumonia Vaccine 8+ Years old  Completed   DEXA SCAN  Completed   Hepatitis C Screening  Completed   HPV VACCINES  Aged Out   COVID-19 Vaccine  Discontinued    Health Maintenance  Health Maintenance Due  Topic Date Due   MAMMOGRAM  07/14/2021    Colorectal cancer screening: Type of screening: Cologuard. Completed  11/11/2020. Repeat every 3 years  Mammogram status: Ordered patient to schedule . Pt provided with contact info and advised to call to schedule appt.   Bone Density status: Completed 09/23/2019. Results reflect: Bone density results: OSTEOPENIA. Repeat every 5 years.  Lung Cancer Screening: (Low Dose CT Chest recommended if Age 28-80 years, 30 pack-year currently smoking OR have quit w/in 15years.) does not qualify.   Lung Cancer Screening Referral: n/a  Additional Screening:  Hepatitis C Screening: does not qualify;  Vision Screening: Recommended annual ophthalmology exams for early detection of glaucoma and other disorders of the eye. Is the patient up to date with their annual eye exam?  Yes  Who is the provider or what is the name of the office in which the patient attends annual eye exams? Dr.Ra\nkin  If pt is not established with a provider, would they like to be referred to a provider to establish care? No .   Dental Screening: Recommended annual dental exams for proper oral hygiene  Community Resource Referral / Chronic Care Management: CRR required this visit?  No   CCM required this visit?  No      Plan:     I have personally reviewed and noted the following in the patient's chart:   Medical and social history Use of alcohol, tobacco or illicit drugs  Current medications and supplements including opioid prescriptions.  Functional ability and status Nutritional status Physical activity Advanced directives List of other  physicians Hospitalizations, surgeries, and ER visits in previous 12 months Vitals Screenings to include cognitive, depression, and falls Referrals and appointments  In addition, I have reviewed and discussed with patient certain preventive protocols, quality metrics, and best practice recommendations. A written personalized care plan for preventive services as well as general preventive health recommendations were provided to patient.     Randel Pigg, LPN   03/28/7586   Nurse Notes: none

## 2021-08-17 NOTE — Patient Instructions (Signed)
Stacey Page , Thank you for taking time to come for your Medicare Wellness Visit. I appreciate your ongoing commitment to your health goals. Please review the following plan we discussed and let me know if I can assist you in the future.   Screening recommendations/referrals: Colonoscopy: cologuard 11/11/2020 Mammogram: patient to schedule  Bone Density: 0816/2021 Recommended yearly ophthalmology/optometry visit for glaucoma screening and checkup Recommended yearly dental visit for hygiene and checkup  Vaccinations: Influenza vaccine: completed  Pneumococcal vaccine: completed  Tdap vaccine: due  Shingles vaccine: due 2nd vaccine     Advanced directives: none   Conditions/risks identified: none   Next appointment: none    Preventive Care 27 Years and Older, Female Preventive care refers to lifestyle choices and visits with your health care provider that can promote health and wellness. What does preventive care include? A yearly physical exam. This is also called an annual well check. Dental exams once or twice a year. Routine eye exams. Ask your health care provider how often you should have your eyes checked. Personal lifestyle choices, including: Daily care of your teeth and gums. Regular physical activity. Eating a healthy diet. Avoiding tobacco and drug use. Limiting alcohol use. Practicing safe sex. Taking low-dose aspirin every day. Taking vitamin and mineral supplements as recommended by your health care provider. What happens during an annual well check? The services and screenings done by your health care provider during your annual well check will depend on your age, overall health, lifestyle risk factors, and family history of disease. Counseling  Your health care provider may ask you questions about your: Alcohol use. Tobacco use. Drug use. Emotional well-being. Home and relationship well-being. Sexual activity. Eating habits. History of falls. Memory and  ability to understand (cognition). Work and work Statistician. Reproductive health. Screening  You may have the following tests or measurements: Height, weight, and BMI. Blood pressure. Lipid and cholesterol levels. These may be checked every 5 years, or more frequently if you are over 38 years old. Skin check. Lung cancer screening. You may have this screening every year starting at age 12 if you have a 30-pack-year history of smoking and currently smoke or have quit within the past 15 years. Fecal occult blood test (FOBT) of the stool. You may have this test every year starting at age 61. Flexible sigmoidoscopy or colonoscopy. You may have a sigmoidoscopy every 5 years or a colonoscopy every 10 years starting at age 74. Hepatitis C blood test. Hepatitis B blood test. Sexually transmitted disease (STD) testing. Diabetes screening. This is done by checking your blood sugar (glucose) after you have not eaten for a while (fasting). You may have this done every 1-3 years. Bone density scan. This is done to screen for osteoporosis. You may have this done starting at age 21. Mammogram. This may be done every 1-2 years. Talk to your health care provider about how often you should have regular mammograms. Talk with your health care provider about your test results, treatment options, and if necessary, the need for more tests. Vaccines  Your health care provider may recommend certain vaccines, such as: Influenza vaccine. This is recommended every year. Tetanus, diphtheria, and acellular pertussis (Tdap, Td) vaccine. You may need a Td booster every 10 years. Zoster vaccine. You may need this after age 66. Pneumococcal 13-valent conjugate (PCV13) vaccine. One dose is recommended after age 37. Pneumococcal polysaccharide (PPSV23) vaccine. One dose is recommended after age 45. Talk to your health care provider about which screenings and  vaccines you need and how often you need them. This information is  not intended to replace advice given to you by your health care provider. Make sure you discuss any questions you have with your health care provider. Document Released: 02/20/2015 Document Revised: 10/14/2015 Document Reviewed: 11/25/2014 Elsevier Interactive Patient Education  2017 [employer] Prevention in the Home Falls can cause injuries. They can happen to people of all ages. There are many things you can do to make your home safe and to help prevent falls. What can I do on the outside of my home? Regularly fix the edges of walkways and driveways and fix any cracks. Remove anything that might make you trip as you walk through a door, such as a raised step or threshold. Trim any bushes or trees on the path to your home. Use bright outdoor lighting. Clear any walking paths of anything that might make someone trip, such as rocks or tools. Regularly check to see if handrails are loose or broken. Make sure that both sides of any steps have handrails. Any raised decks and porches should have guardrails on the edges. Have any leaves, snow, or ice cleared regularly. Use sand or salt on walking paths during winter. Clean up any spills in your garage right away. This includes oil or grease spills. What can I do in the bathroom? Use night lights. Install grab bars by the toilet and in the tub and shower. Do not use towel bars as grab bars. Use non-skid mats or decals in the tub or shower. If you need to sit down in the shower, use a plastic, non-slip stool. Keep the floor dry. Clean up any water that spills on the floor as soon as it happens. Remove soap buildup in the tub or shower regularly. Attach bath mats securely with double-sided non-slip rug tape. Do not have throw rugs and other things on the floor that can make you trip. What can I do in the bedroom? Use night lights. Make sure that you have a light by your bed that is easy to reach. Do not use any sheets or blankets that  are too big for your bed. They should not hang down onto the floor. Have a firm chair that has side arms. You can use this for support while you get dressed. Do not have throw rugs and other things on the floor that can make you trip. What can I do in the kitchen? Clean up any spills right away. Avoid walking on wet floors. Keep items that you use a lot in easy-to-reach places. If you need to reach something above you, use a strong step stool that has a grab bar. Keep electrical cords out of the way. Do not use floor polish or wax that makes floors slippery. If you must use wax, use non-skid floor wax. Do not have throw rugs and other things on the floor that can make you trip. What can I do with my stairs? Do not leave any items on the stairs. Make sure that there are handrails on both sides of the stairs and use them. Fix handrails that are broken or loose. Make sure that handrails are as long as the stairways. Check any carpeting to make sure that it is firmly attached to the stairs. Fix any carpet that is loose or worn. Avoid having throw rugs at the top or bottom of the stairs. If you do have throw rugs, attach them to the floor with carpet tape. Make  sure that you have a light switch at the top of the stairs and the bottom of the stairs. If you do not have them, ask someone to add them for you. What else can I do to help prevent falls? Wear shoes that: Do not have high heels. Have rubber bottoms. Are comfortable and fit you well. Are closed at the toe. Do not wear sandals. If you use a stepladder: Make sure that it is fully opened. Do not climb a closed stepladder. Make sure that both sides of the stepladder are locked into place. Ask someone to hold it for you, if possible. Clearly mark and make sure that you can see: Any grab bars or handrails. First and last steps. Where the edge of each step is. Use tools that help you move around (mobility aids) if they are needed. These  include: Canes. Walkers. Scooters. Crutches. Turn on the lights when you go into a dark area. Replace any light bulbs as soon as they burn out. Set up your furniture so you have a clear path. Avoid moving your furniture around. If any of your floors are uneven, fix them. If there are any pets around you, be aware of where they are. Review your medicines with your doctor. Some medicines can make you feel dizzy. This can increase your chance of falling. Ask your doctor what other things that you can do to help prevent falls. This information is not intended to replace advice given to you by your health care provider. Make sure you discuss any questions you have with your health care provider. Document Released: 11/20/2008 Document Revised: 07/02/2015 Document Reviewed: 02/28/2014 Elsevier Interactive Patient Education  2017 [employer].

## 2021-08-23 ENCOUNTER — Encounter (INDEPENDENT_AMBULATORY_CARE_PROVIDER_SITE_OTHER): Payer: Self-pay | Admitting: Adult Health

## 2021-08-23 ENCOUNTER — Ambulatory Visit (INDEPENDENT_AMBULATORY_CARE_PROVIDER_SITE_OTHER): Payer: No Typology Code available for payment source | Admitting: Adult Health

## 2021-08-23 VITALS — BP 146/82 | HR 59 | Temp 97.7°F | Ht 68.0 in | Wt 347.0 lb

## 2021-08-23 DIAGNOSIS — Z7984 Long term (current) use of oral hypoglycemic drugs: Secondary | ICD-10-CM | POA: Diagnosis not present

## 2021-08-23 DIAGNOSIS — E785 Hyperlipidemia, unspecified: Secondary | ICD-10-CM

## 2021-08-23 DIAGNOSIS — I10 Essential (primary) hypertension: Secondary | ICD-10-CM

## 2021-08-23 DIAGNOSIS — I5032 Chronic diastolic (congestive) heart failure: Secondary | ICD-10-CM

## 2021-08-23 DIAGNOSIS — E1169 Type 2 diabetes mellitus with other specified complication: Secondary | ICD-10-CM

## 2021-08-23 DIAGNOSIS — Z87891 Personal history of nicotine dependence: Secondary | ICD-10-CM

## 2021-08-23 DIAGNOSIS — E669 Obesity, unspecified: Secondary | ICD-10-CM | POA: Diagnosis not present

## 2021-08-23 DIAGNOSIS — Z6841 Body Mass Index (BMI) 40.0 and over, adult: Secondary | ICD-10-CM

## 2021-08-23 DIAGNOSIS — I110 Hypertensive heart disease with heart failure: Secondary | ICD-10-CM

## 2021-08-24 ENCOUNTER — Telehealth: Payer: Self-pay | Admitting: Family Medicine

## 2021-08-24 NOTE — Telephone Encounter (Signed)
Daley from [employer] called and said they found a vender that can fill the diabetes order. That company is [employer] and the fax number is [phone] and the phone number is [phone]

## 2021-08-24 NOTE — Progress Notes (Unsigned)
Chief Complaint:   OBESITY Stacey Page is here to discuss her progress with her obesity treatment plan along with follow-up of her obesity related diagnoses. Stacey Page is on the Category 2 Plan and states she is following her eating plan approximately 15% of the time. Stacey Page states she is walking for 15 minutes 3-4 times per week.  Today's visit was #: 26 Starting weight: 344 lbs Starting date: 03/04/2021 Today's weight: 347 lbs Today's date: 08/23/2021 Total lbs lost to date: 0 Total lbs lost since last in-office visit: 0  Interim History: ***  Subjective:   1. Type 2 diabetes mellitus with other specified complication, without long-term current use of insulin (HCC) ***   Assessment/Plan:   1. Type 2 diabetes mellitus with other specified complication, without long-term current use of insulin (HCC) ***  2. Obesity, current BMI 52.8 Stacey Page is currently in the action stage of change. As such, her goal is to continue with weight loss efforts. She has agreed to the Category 2 Plan.   Exercise goals: As is.   Behavioral modification strategies: increasing lean protein intake, decreasing simple carbohydrates, meal planning and cooking strategies, keeping healthy foods in the home, and planning for success.  Stacey Page has agreed to follow-up with our clinic in 4 weeks. She was informed of the importance of frequent follow-up visits to maximize her success with intensive lifestyle modifications for her multiple health conditions.   Objective:   Blood pressure (!) 146/82, pulse (!) 59, temperature 97.7 F (36.5 C), height '5\' 8"'$  (1.727 m), weight (!) 347 lb (157.4 kg), SpO2 94 %. Body mass index is 52.76 kg/m.  General: Cooperative, alert, well developed, in no acute distress. HEENT: Conjunctivae and lids unremarkable. Cardiovascular: Regular rhythm.  Lungs: Normal work of breathing. Neurologic: No focal deficits.   Lab Results  Component Value Date   CREATININE  1.87 (H) 07/14/2021   BUN 27 (H) 07/14/2021   NA 142 07/14/2021   K 4.3 07/14/2021   CL 106 07/14/2021   CO2 32 07/14/2021   Lab Results  Component Value Date   ALT 7 07/14/2021   AST 10 (L) 07/14/2021   ALKPHOS 65 07/14/2021   BILITOT 0.3 07/14/2021   Lab Results  Component Value Date   HGBA1C 6.0 (A) 07/14/2021   HGBA1C 6.4 (A) 04/14/2021   HGBA1C 6.1 (H) 10/14/2020   HGBA1C 6.1 05/14/2020   HGBA1C 6.0 (H) 10/28/2019   Lab Results  Component Value Date   INSULIN 8.6 10/28/2019   INSULIN 9.2 07/04/2019   INSULIN 8.8 03/25/2019   Lab Results  Component Value Date   TSH 0.51 11/28/2018   Lab Results  Component Value Date   CHOL 116 05/14/2020   HDL 51.60 05/14/2020   LDLCALC 52 05/14/2020   TRIG 61.0 05/14/2020   CHOLHDL 2 05/14/2020   Lab Results  Component Value Date   VD25OH 57.6 10/28/2019   VD25OH 41.5 07/04/2019   VD25OH 38.2 03/25/2019   Lab Results  Component Value Date   WBC 4.8 07/14/2021   HGB 11.6 (L) 07/14/2021   HCT 37.9 07/14/2021   MCV 95.5 07/14/2021   PLT 205 07/14/2021   Lab Results  Component Value Date   IRON 54 04/13/2021   TIBC 291 04/13/2021   FERRITIN 31 07/14/2021   Attestation Statements:   Reviewed by clinician on day of visit: allergies, medications, problem list, medical history, surgical history, family history, social history, and previous encounter notes.  Time spent on visit including pre-visit  chart review and post-visit care and charting was 27 minutes.   Wilhemena Durie, am acting as transcriptionist for Mina Marble, NP.  I have reviewed the above documentation for accuracy and completeness, and I agree with the above. -  ***

## 2021-08-25 ENCOUNTER — Telehealth: Payer: Self-pay

## 2021-08-25 NOTE — Progress Notes (Signed)
Chronic Care Management APPOINTMENT REMINDER   Stacey Page was reminded to bring in freestyle [employer], have all medications, supplements and any blood glucose and blood pressure readings available for review with Junius Argyle, Pharm. D, at her office visit on 08/26/2021 at 9:15 am.  Patient Confirm appointment.  [facility] Pharmacist Assistant [phone]

## 2021-08-26 ENCOUNTER — Other Ambulatory Visit: Payer: Self-pay | Admitting: Family Medicine

## 2021-08-26 ENCOUNTER — Ambulatory Visit: Payer: No Typology Code available for payment source

## 2021-08-26 DIAGNOSIS — E1151 Type 2 diabetes mellitus with diabetic peripheral angiopathy without gangrene: Secondary | ICD-10-CM

## 2021-08-26 DIAGNOSIS — Z1231 Encounter for screening mammogram for malignant neoplasm of breast: Secondary | ICD-10-CM

## 2021-08-26 MED ORDER — FREESTYLE LIBRE 14 DAY SENSOR MISC: 3 refills | Status: DC

## 2021-08-26 NOTE — Addendum Note (Signed)
Addended by: Daron Offer A on: 08/26/2021 11:43 AM   Modules accepted: Orders

## 2021-08-26 NOTE — Progress Notes (Addendum)
Chronic Care Management Pharmacy Note  08/26/2021 Name:  Stacey Page MRN:  [id number] DOB:  1950-05-27  Summary: Patient presents for CCM follow-up.   -Patient was given extensive training on the [facility] 14-day system today. Patient consented to sharing data with clinical pharmacy team to remotely monitor blood sugars.   Recommendations/Changes made from today's visit: -Continue current medications  Plan: CPP follow-up 3 months  Subjective: Stacey Page is an 71 y.o. year old female who is a primary patient of Stacey Page, Stacey Fries, MD.  The CCM team was consulted for assistance with disease management and care coordination needs.    Engaged with patient by telephone for follow up visit to re-establish care in response to provider referral for pharmacy case management and/or care coordination services.   Consent to Services:  The patient was given information about Chronic Care Management services, agreed to services, and gave verbal consent prior to initiation of services.  Please see initial visit note for detailed documentation.   Patient Care Team: [name], MD as PCP - General (Family Medicine) Stacey Man, MD as PCP - Cardiology (Cardiology) Stacey Page, [facility] as Pharmacist (Pharmacist) Dannielle Karvonen, RN as Case Manager  Recent office visits: 07/22/21: Patient presented to Dr. Ethelene Page for follow-up.  07/14/21: Patient presented to Dr. Ethelene Page for follow-up.   Recent consult visits: 07/19/21: Patient presented to Mina Marble, NP (Weight Management) for follow-up.    Hospital visits: None in previous 6 months   Objective:  Lab Results  Component Value Date   CREATININE 1.87 (H) 07/14/2021   BUN 27 (H) 07/14/2021   GFR 26.66 (L) 02/16/2021   EGFR 29 06/25/2021   GFRNONAA 29 (L) 07/14/2021   GFRAA 40 (L) 10/28/2019   NA 142 07/14/2021   K 4.3 07/14/2021   CALCIUM 9.9 07/14/2021   CO2 32 07/14/2021   GLUCOSE 107 (H)  07/14/2021    Lab Results  Component Value Date/Time   HGBA1C 6.0 (A) 07/14/2021 11:25 AM   HGBA1C 6.4 (A) 04/14/2021 08:56 AM   HGBA1C 6.1 (H) 10/14/2020 10:57 AM   HGBA1C 6.1 05/14/2020 09:10 AM   GFR 26.66 (L) 02/16/2021 08:03 AM   GFR 29.58 (L) 05/21/2020 08:26 AM   MICROALBUR 1.4 05/14/2020 09:10 AM   MICROALBUR 1.9 01/16/2018 11:17 AM    Last diabetic Eye exam:  Lab Results  Component Value Date/Time   HMDIABEYEEXA No Retinopathy 11/05/2020 12:00 AM    Last diabetic Foot exam:  Lab Results  Component Value Date/Time   HMDIABFOOTEX normal 09/10/2018 12:00 AM     Lab Results  Component Value Date   CHOL 116 05/14/2020   HDL 51.60 05/14/2020   LDLCALC 52 05/14/2020   TRIG 61.0 05/14/2020   CHOLHDL 2 05/14/2020       Latest Ref Rng & Units 07/14/2021    9:40 AM 06/25/2021   12:00 AM 04/13/2021    9:34 AM  Hepatic Function  Total Protein 6.5 - 8.1 g/dL 7.6   7.6   Albumin 3.5 - 5.0 g/dL 3.9  4.1     3.8   AST 15 - 41 U/L 10   10   ALT 0 - 44 U/L 7   8   Alk Phosphatase 38 - 126 U/L 65   67   Total Bilirubin 0.3 - 1.2 mg/dL 0.3   0.4      This result is from an external source.    Lab Results  Component Value Date/Time  TSH 0.51 11/28/2018 10:42 AM   TSH 0.81 01/19/2017 10:13 AM       Latest Ref Rng & Units 07/14/2021    9:40 AM 06/25/2021   12:00 AM 04/13/2021    9:34 AM  CBC  WBC 4.0 - 10.5 K/uL 4.8   4.7   Hemoglobin 12.0 - 15.0 g/dL 11.6  12.2     11.9   Hematocrit 36.0 - 46.0 % 37.9   38.7   Platelets 150 - 400 K/uL 205   197      This result is from an external source.    Lab Results  Component Value Date/Time   VD25OH 57.6 10/28/2019 09:31 AM   VD25OH 41.5 07/04/2019 12:40 PM    Clinical ASCVD: No  The ASCVD Risk score (Arnett DK, et al., 2019) failed to calculate for the following reasons:   The valid total cholesterol range is 130 to 320 mg/dL       08/17/2021   11:16 AM 08/17/2021   11:12 AM 07/22/2021    1:18 PM  Depression screen  PHQ 2/9  Decreased Interest 0 0 0  Down, Depressed, Hopeless 0 0 0  PHQ - 2 Score 0 0 0    Social History   Tobacco Use  Smoking Status Former   Packs/day: 0.25   Years: 15.00   Total pack years: 3.75   Types: Cigarettes   Quit date: 02/08/1980   Years since quitting: 41.5  Smokeless Tobacco Former   BP Readings from Last 3 Encounters:  08/23/21 (!) 146/82  08/06/21 (!) 148/78  07/22/21 (!) 179/80   Pulse Readings from Last 3 Encounters:  08/23/21 (!) 59  08/06/21 60  07/22/21 (!) 57   Wt Readings from Last 3 Encounters:  08/23/21 (!) 347 lb (157.4 kg)  08/06/21 (!) 348 lb 6.4 oz (158 kg)  07/22/21 (!) 349 lb 9.6 oz (158.6 kg)   BMI Readings from Last 3 Encounters:  08/23/21 52.76 kg/m  08/06/21 52.97 kg/m  07/22/21 56.43 kg/m    Assessment/Interventions: Review of patient past medical history, allergies, medications, health status, including review of consultants reports, laboratory and other test data, was performed as part of comprehensive evaluation and provision of chronic care management services.   SDOH:  (Social Determinants of Health) assessments and interventions performed: Yes   SDOH Screenings   Alcohol Screen: Low Risk  (08/17/2021)   Alcohol Screen    Last Alcohol Screening Score (AUDIT): 0  Depression (PHQ2-9): Low Risk  (08/17/2021)   Depression (PHQ2-9)    PHQ-2 Score: 0  Financial Resource Strain: Low Risk  (08/17/2021)   Overall Financial Resource Strain (CARDIA)    Difficulty of Paying Living Expenses: Not hard at all  Food Insecurity: No Food Insecurity (08/17/2021)   Hunger Vital Sign    Worried About Running Out of Food in the Last Year: Never true    Ran Out of Food in the Last Year: Never true  Housing: Low Risk  (08/17/2021)   Housing    Last Housing Risk Score: 0  Physical Activity: Insufficiently Active (08/17/2021)   Exercise Vital Sign    Days of Exercise per Week: 3 days    Minutes of Exercise per Session: 30 min  Social  Connections: Socially Isolated (08/17/2021)   Social Connection and Isolation Panel [NHANES]    Frequency of Communication with Friends and Family: Three times a week    Frequency of Social Gatherings with Friends and Family: Three times a week  Attends Religious Services: Never    Active Member of Clubs or Organizations: No    Attends Archivist Meetings: Never    Marital Status: Widowed  Stress: No Stress Concern Present (08/17/2021)   [place]    Feeling of Stress : Not at all  Tobacco Use: Medium Risk (08/23/2021)   Patient History    Smoking Tobacco Use: Former    Smokeless Tobacco Use: Former    Passive Exposure: Not on Pensions consultant Needs: No Transportation Needs (08/17/2021)   PRAPARE - Hydrologist (Medical): No    Lack of Transportation (Non-Medical): No    CCM Care Plan  No Known Allergies  Medications Reviewed Today     Reviewed by Verdia Kuba, CMA (Certified Medical Assistant) on 08/23/21 at 0750  Med List Status: <None>   Medication Order Taking? Sig Documenting Provider Last Dose Status Informant  albuterol (PROAIR HFA) 108 (90 Base) MCG/ACT inhaler [id number] Yes Inhale 1-2 puffs into the lungs every 6 (six) hours as needed for wheezing or shortness of breath. Flossie Buffy, NP Taking Active   aspirin 81 MG tablet [id number] Yes Take 1 tablet (81 mg total) by mouth daily with breakfast. [name], [name], NP Taking Active Self  atorvastatin (LIPITOR) 20 MG tablet [id number] Yes TAKE 1 TABLET BY MOUTH  DAILY AT 6 PM. [name], [name], NP Taking Active   Blood Glucose Monitoring Suppl (ONETOUCH VERIO [facility] SYSTEM) w/Device KIT [id number] Yes 1 kit by Does not apply route daily as needed. [name], MD Taking Active   carvedilol (COREG) 25 MG tablet [id number] Yes Take 1 tablet (25 mg total) by mouth 2 (two) times daily. Stacey Man,  MD Taking Active   cholecalciferol (VITAMIN D3) 25 MCG (1000 UNIT) tablet [id number] Yes Take 2,000 Units by mouth daily. [provider] Taking Active   Continuous Blood Gluc Sensor ([place]) [place] [id number] Yes Inject 1 kit into the skin daily. [name], MD Taking Active   Docusate Sodium 100 MG capsule [id number] Yes Take 100 mg by mouth 2 (two) times daily. [provider] Taking Active Self  ferrous sulfate 325 (65 FE) MG tablet [id number] Yes Take 325 mg by mouth 2 (two) times daily with a meal. [provider] Taking Active Self  fluticasone (FLONASE) 50 MCG/ACT nasal spray [id number] Yes INSTILL 1 SPRAY IN EACH NOSTRIL DAILY [name], [name], NP Taking Active   furosemide (LASIX) 40 MG tablet [id number] Yes TAKE 1 TAB FOUR TIMES PER WEEK, TAKE AN ADDITIONAL TAB FOR WEIGHT GAIN OF 2 LBS OVERNIGHT OR 5 LBS IN ONE WEEK Stacey Man, MD Taking Active   gabapentin (NEURONTIN) 100 MG capsule [id number] Yes Take 1 capsule (100 mg total) by mouth 2 (two) times daily. [name], [name], NP Taking Active   glucose blood (ONETOUCH VERIO) test strip [id number] Yes 1 each by Other route as needed for other. Use as instructed [name], MD Taking Active   hydrALAZINE (APRESOLINE) 50 MG tablet [id number] Yes Take 2 tablets (100 mg total) by mouth 3 (three) times daily. Stacey Man, MD Taking Active   isosorbide dinitrate (ISORDIL) 20 MG tablet [id number] Yes Take 2 tablet ( 40 mg total ) by mouth in morning and evening with Hydralazine 100 mg  dose Stacey Man, MD Taking Active   JANUVIA 25 MG tablet [id number] Yes TAKE ONE TABLET BY  MOUTH ONCE DAILY [name], [name], NP Taking Active   lisinopril (ZESTRIL) 20 MG tablet [id number] Yes TAKE 1 TABLET BY MOUTH ONCE DAILY WITH BREAKFAST [name], [name], NP Taking Active   montelukast (SINGULAIR) 10 MG tablet [id number] Yes Take 1 tablet (10 mg total) by mouth at bedtime. [name],  [name], NP Taking Active   OneTouch Delica Lancets 50P MISC [id number] Yes Use to monitor blood sugars once daily as directed [name], MD Taking Active             Patient Active Problem List   Diagnosis Date Noted   Hypertensive retinopathy of right eye, grade 1 08/11/2021   Grade 2 hypertensive retinopathy, left 08/11/2021   Nuclear sclerotic cataract of both eyes 08/11/2021   Need for shingles vaccine 07/14/2021   Iron deficiency anemia 01/13/2021   CKD (chronic kidney disease) stage 4, GFR 15-29 ml/min ([place]) 07/09/2019   Diabetes mellitus type 2 in obese ([place]) 07/02/2019   Polyneuropathy associated with underlying disease ([place]) 11/28/2018   Severe episode of recurrent major depressive disorder, without psychotic features ([place]) 11/28/2018   Type 2 diabetes mellitus with diabetic peripheral angiopathy without gangrene, without long-term current use of insulin ([place]) 11/28/2018   Poor short term memory 11/28/2018   Mild aortic stenosis by prior echocardiogram 11/06/2018   Diabetes mellitus ([place]) 03/21/2017   Symptomatic anemia 03/17/2016   S/P total knee arthroplasty, right 03/17/2016   Unilateral primary osteoarthritis, right knee    Hyperlipidemia associated with type 2 diabetes mellitus ([place]) 12/15/2015   Pain in both lower extremities 11/19/2015   Numbness in both hands 08/13/2015   Panic attack 06/09/2015   Right knee pain 05/14/2015   Chronic renal insufficiency 09/16/2014   Pneumothorax on right    Atypical mycobacterium infection    ILD (interstitial lung disease) 2/2 MAI s/p med rxn    Class 3 severe obesity with serious comorbidity and body mass index (BMI) of 50.0 to 59.9 in adult ([place]) 04/03/2014   OSA (obstructive sleep apnea) 02/25/2014   Hypertensive heart disease with chronic diastolic congestive heart failure ([place]) 02/25/2014   DOE (dyspnea on exertion) 02/25/2014   Essential hypertension 02/25/2014    Immunization History   Administered Date(s) Administered   Fluad Quad(high Dose 65+) 11/28/2018, 01/29/2020   Influenza Split 11/21/2016   Influenza,inj,Quad PF,6+ Mos 01/12/2015, 11/19/2015   PFIZER(Purple Top)SARS-COV-2 Vaccination 03/31/2019, 04/23/2019, 03/21/2020   Pneumococcal Conjugate-13 01/19/2017   Pneumococcal Polysaccharide-23 01/12/2015, 05/14/2020   Zoster Recombinat (Shingrix) 07/14/2021    Conditions to be addressed/monitored:  Hypertension, Hyperlipidemia, Diabetes, Heart Failure, Chronic Kidney Disease, and Allergic Rhinitis  Care Plan : General Pharmacy (Adult)  Updates made by Stacey Page, RPH since 08/26/2021 12:00 AM     Problem: Hypertension, Hyperlipidemia, Diabetes, Heart Failure, Chronic Kidney Disease, and Allergic Rhinitis   Priority: High     Long-Range Goal: Patient-Specific Goal   Start Date: 04/29/2021  Expected End Date: 04/30/2022  This Visit's Progress: On track  Recent Progress: On track  Priority: High  Note:   Current Barriers:  Unable to independently afford treatment regimen  Pharmacist Clinical Goal(s):  Patient will verbalize ability to afford treatment regimen maintain control of diabetes as evidenced by A1c less than 7%  through collaboration with PharmD and provider.   Interventions: 1:1 collaboration with [name], [name], NP regarding development and update of comprehensive plan of care as evidenced by provider attestation and co-signature Inter-disciplinary care team collaboration (see longitudinal plan of care) Comprehensive medication  review performed; medication list updated in electronic medical record  Heart Failure (Goal: manage symptoms and prevent exacerbations) -Controlled -Last ejection fraction: 60-65% (Date: 2021) -HF type: Diastolic -NYHA Class: II (slight limitation of activity) -Current treatment: Carvedilol 25 mg twice daily: Appropriate, Effective, Safe, Accessible Furosemide 40 mg 3-4 times weekly: Appropriate,  Effective, Safe, Accessible  Hydralazine 50 mg three times daily: Appropriate, Effective, Safe, Accessible  Isosorbide dinitrate 20 mg twice daily  Lisinopril 20 mg daily: Appropriate, Effective, Safe, Accessible  -Medications previously tried: NA  -Recommended to continue current medication  Hyperlipidemia: (LDL goal < 70) -Controlled -Current treatment: Atorvastatin 20 mg daily: Appropriate, Effective, Safe, Accessible  -Medications previously tried: NA  -Recommended to continue current medication  Diabetes (A1c goal <7%) -Controlled -Current medications: Januvia 25 mg daily: Appropriate, Effective, Safe, Accessible  -Medications previously tried: Ozempic (Cost)   -Current home glucose readings fasting glucose: 74-135  -Denies hypoglycemic/hyperglycemic symptoms -Patient was given extensive training on the [employer] 14-day system today. Patient consented to sharing data with clinical pharmacy team to remotely monitor blood sugars.  -Recommended to continue current medication  Allergic rhinitis (Goal: Minimize symptoms) -Uncontrolled -Current treatment  Flonase - 2 sprays three times daily: Query Appropriate Montelukast 10 mg daily: Appropriate, Query effective -Medications previously tried: NA -Discussed risks of frequent corticosteroid use.  -Decrease Flonase use to twice daily at most, advised she could utilize saline nasal spray as needed for congestion in between uses.   Chronic Kidney Disease Stage 4  -All medications assessed for renal dosing and appropriateness in chronic kidney disease. -Managed by Dr. Royce Macadamia, last visit was February 2022.  -Recommended to continue current medication  Patient Goals/Self-Care Activities Patient will:  - check glucose daily, document, and provide at future appointments check blood pressure weekly, document, and provide at future appointments weigh daily, and contact provider if weight gain of greater than 2 pounds in 24 hours    Follow Up Plan: Telephone appointment with care management team member scheduled for: 12/02/2021 at 11:00 AM     Medication Assistance:  Januvia obtained through [employer] medication assistance program.  Enrollment ends Dec 2023   Compliance/Adherence/Medication fill history: Care Gaps: Shingrix Vaccine COVID-19 Vaccine  Star-Rating Drugs: Atorvastatin 20 mg last filled 03/08/2021 23 day supply at [facility]. Lisinopril 20 mg last filled 02/16/2021 45 day supply at [facility].  Patient's preferred pharmacy is:  [facility] - [place], [place] - [address] [place] [address] Phone: [phone] Fax: [phone]  Uses pill box? Yes Pt endorses 100% compliance  Patient decided to: Utilize [facility] for medication synchronization, packaging and delivery  Care Plan and Follow Up Patient Decision:  Patient agrees to Care Plan and Follow-up.  Plan: Telephone follow up appointment with care management team member scheduled for:  12/02/21 at 11:00 AM  Junius Argyle, PharmD, Para March, CPP Clinical Pharmacist Practitioner  [facility] at [facility]  [phone]

## 2021-08-26 NOTE — Patient Instructions (Addendum)
Visit Information It was great speaking with you today!  Please let me know if you have any questions about our visit.  Plan:  Begin using the [employer] LibreLink App to scan your blood sugars throughout the day.  If your sensor falls off early you can request a replacement by contacting [employer] at [phone]  Print copy of patient instructions, educational materials, and care plan provided in person.  Telephone follow up appointment with pharmacy team member scheduled for: 12/02/21 at 11:00 AM  Junius Argyle, PharmD, Para March, CPP Clinical Pharmacist Practitioner  [facility] at [facility]  [phone]

## 2021-08-27 NOTE — Telephone Encounter (Signed)
[facility] called in again, please send the order to [facility]. They also requested that we call the patient [phone]

## 2021-08-30 NOTE — Telephone Encounter (Signed)
[facility] called again, asked to please send order over to [facility].  Fax [phone]

## 2021-09-06 ENCOUNTER — Ambulatory Visit: Payer: No Typology Code available for payment source | Admitting: Podiatry

## 2021-09-06 ENCOUNTER — Encounter: Payer: Self-pay | Admitting: Podiatry

## 2021-09-06 DIAGNOSIS — L84 Corns and callosities: Secondary | ICD-10-CM

## 2021-09-06 DIAGNOSIS — B351 Tinea unguium: Secondary | ICD-10-CM | POA: Diagnosis not present

## 2021-09-06 DIAGNOSIS — N184 Chronic kidney disease, stage 4 (severe): Secondary | ICD-10-CM

## 2021-09-06 DIAGNOSIS — M79674 Pain in right toe(s): Secondary | ICD-10-CM | POA: Diagnosis not present

## 2021-09-06 DIAGNOSIS — I110 Hypertensive heart disease with heart failure: Secondary | ICD-10-CM

## 2021-09-06 DIAGNOSIS — M79675 Pain in left toe(s): Secondary | ICD-10-CM | POA: Diagnosis not present

## 2021-09-06 DIAGNOSIS — I5032 Chronic diastolic (congestive) heart failure: Secondary | ICD-10-CM | POA: Diagnosis not present

## 2021-09-06 DIAGNOSIS — I10 Essential (primary) hypertension: Secondary | ICD-10-CM | POA: Diagnosis not present

## 2021-09-06 DIAGNOSIS — E1142 Type 2 diabetes mellitus with diabetic polyneuropathy: Secondary | ICD-10-CM

## 2021-09-09 NOTE — Progress Notes (Deleted)
  Subjective:  Patient ID: Stacey Page, female    DOB: 09/29/1950,  MRN: [id number]  Stacey Page presents to clinic today for at risk foot care with history of diabetic neuropathy and callus(es) b/l lower extremities and painful thick toenails that are difficult to trim. Painful toenails interfere with ambulation. Aggravating factors include wearing enclosed shoe gear. Pain is relieved with periodic professional debridement. Painful calluses are aggravated when weightbearing with and without shoegear. Pain is relieved with periodic professional debridement.  Patient states blood glucose was 91 mg/dl today.    Last A1c was 6.1%.  Last known HgA1c was unknown.    New problem(s): None.   PCP is [name], MD , and last visit was  July 22, 2021  No Known Allergies  Review of Systems: Negative except as noted in the HPI.  Objective: No changes noted in today's physical examination.  Vascular Examination: CFT <3 seconds b/l. DP/PT pulses faintly palpable b/l. Skin temperature gradient warm to warm b/l. No ischemia or gangrene. No cyanosis or clubbing noted b/l. Pedal hair present.   Neurological Examination: Sensation grossly intact b/l with 10 gram monofilament. Vibratory sensation intact b/l.   Dermatological Examination: Pedal integument with normal turgor, texture and tone b/l LE. No open wounds b/l. No interdigital macerations b/l. Toenails 1-5 b/l elongated, thickened, discolored with subungual debris. +Tenderness with dorsal palpation of nailplates. No hyperkeratotic or porokeratotic lesions present.  Musculoskeletal Examination: Muscle strength 5/5 to b/l LE. Hammertoe deformity noted 2-5 b/l. Pes planus deformity noted bilateral LE.  Radiographs: None  Last A1c:      Latest Ref Rng & Units 07/14/2021   11:25 AM 04/14/2021    8:56 AM 10/14/2020   10:57 AM  Hemoglobin A1C  Hemoglobin-A1c 4.0 - 5.6 % 6.0  6.4  6.1        Latest Ref Rng & Units 07/14/2021    11:25 AM 04/14/2021    8:56 AM 10/14/2020   10:57 AM  Hemoglobin A1C  Hemoglobin-A1c 4.0 - 5.6 % 6.0  6.4  6.1     Assessment/Plan: No diagnosis found.   {Jgplan:23602::"-Patient/POA to call should there be question/concern in the interim."}   Return in about 3 months (around 12/07/2021).  Marzetta Board, DPM

## 2021-09-10 NOTE — Progress Notes (Signed)
  Subjective:  Patient ID: Stacey Page, female    DOB: 1950/10/27,  MRN: [id number]   Laxmi Choung presents to clinic today for at risk foot care with history of diabetic neuropathy and callus(es) b/l lower extremities and painful thick toenails that are difficult to trim. Painful toenails interfere with ambulation. Aggravating factors include wearing enclosed shoe gear. Pain is relieved with periodic professional debridement. Painful calluses are aggravated when weightbearing with and without shoegear. Pain is relieved with periodic professional debridement.   Patient states blood glucose was 91 mg/dl today.     Last A1c was 6.1%.   Last known HgA1c was unknown.     New problem(s): None.    PCP is [name], MD , and last visit was  July 22, 2021   No Known Allergies   Review of Systems: Negative except as noted in the HPI.   Objective: No changes noted in today's physical examination.   Vascular Examination: CFT <3 seconds b/l. DP/PT pulses faintly palpable b/l. Skin temperature gradient warm to warm b/l. No ischemia or gangrene. No cyanosis or clubbing noted b/l. Pedal hair present.    Neurological Examination: Sensation grossly intact b/l with 10 gram monofilament. Vibratory sensation intact b/l.    Dermatological Examination: Pedal integument with normal turgor, texture and tone b/l LE. No open wounds b/l. No interdigital macerations b/l. Toenails 1-5 b/l elongated, thickened, discolored with subungual debris. +Tenderness with dorsal palpation of nailplates. No hyperkeratotic or porokeratotic lesions present.   Musculoskeletal Examination: Muscle strength 5/5 to b/l LE. Hammertoe deformity noted 2-5 b/l. Pes planus deformity noted bilateral LE.   Radiographs: None   Last A1c:       Latest Ref Rng & Units 07/14/2021   11:25 AM 04/14/2021    8:56 AM 10/14/2020   10:57 AM  Hemoglobin A1C  Hemoglobin-A1c 4.0 - 5.6 % 6.0  6.4  6.1    Assessment/Plan: 1.  Pain due to onychomycosis of toenails of both feet   2. Callus   3. Diabetic peripheral neuropathy associated with type 2 diabetes mellitus ([place])    -Patient was evaluated and treated. All patient's and/or POA's questions/concerns answered on today's visit. -Diabetic foot examination performed today. -Continue foot and shoe inspections daily. Monitor blood glucose per PCP/Endocrinologist's recommendations. -Patient to continue soft, supportive shoe gear daily. -Mycotic toenails 1-5 bilaterally were debrided in length and girth with sterile nail nippers and dremel without incident. -Offending nail border debrided and curretaged bilateral great toes utilizing sterile nail nipper and currette. Border cleansed with alcohol and triple antibiotic applied. No further treatment required by patient/caregiver. Call office if there are any concerns. -Callus(es) submet head 1 right foot pared utilizing sterile scalpel blade without complication or incident. Total number debrided =1. -Patient/POA to call should there be question/concern in the interim.    Return in about 3 months (around 12/07/2021).   Marzetta Board, DPM

## 2021-09-14 ENCOUNTER — Ambulatory Visit: Payer: No Typology Code available for payment source

## 2021-09-14 DIAGNOSIS — G4733 Obstructive sleep apnea (adult) (pediatric): Secondary | ICD-10-CM | POA: Diagnosis not present

## 2021-09-15 ENCOUNTER — Encounter (INDEPENDENT_AMBULATORY_CARE_PROVIDER_SITE_OTHER): Payer: Self-pay

## 2021-09-17 ENCOUNTER — Telehealth: Payer: Self-pay

## 2021-09-17 DIAGNOSIS — E1169 Type 2 diabetes mellitus with other specified complication: Secondary | ICD-10-CM

## 2021-09-17 NOTE — Progress Notes (Signed)
Chronic Care Management Pharmacy Assistant   Name: Stacey Page  MRN: [id number] DOB: 1950/03/11  Reason for Encounter: Medication Review/Medication Coordination Call.   Recent office visits:  No recent office visit  Recent consult visits:  09/06/2021 Acquanetta Sit DPM (Podiatry) No Medication Changes noted  Hospital visits:  None in previous 6 months  Medications: Outpatient Encounter Medications as of 09/17/2021  Medication Sig   albuterol (PROAIR HFA) 108 (90 Base) MCG/ACT inhaler Inhale 1-2 puffs into the lungs every 6 (six) hours as needed for wheezing or shortness of breath.   aspirin 81 MG tablet Take 1 tablet (81 mg total) by mouth daily with breakfast.   atorvastatin (LIPITOR) 20 MG tablet TAKE 1 TABLET BY MOUTH  DAILY AT 6 PM.   Blood Glucose Monitoring Suppl ([place]) w/Device KIT 1 kit by Does not apply route daily as needed.   carvedilol (COREG) 25 MG tablet Take 1 tablet (25 mg total) by mouth 2 (two) times daily.   cholecalciferol (VITAMIN D3) 25 MCG (1000 UNIT) tablet Take 2,000 Units by mouth daily.   Continuous Blood Gluc Sensor (FREESTYLE LIBRE 14 DAY SENSOR) MISC Place one sensor on the skin every 14 days to monitor blood sugars continuously as directed.   Docusate Sodium 100 MG capsule Take 100 mg by mouth 2 (two) times daily.   ferrous sulfate 325 (65 FE) MG tablet Take 325 mg by mouth 2 (two) times daily with a meal.   fluticasone (FLONASE) 50 MCG/ACT nasal spray INSTILL 1 SPRAY IN EACH NOSTRIL DAILY   furosemide (LASIX) 40 MG tablet TAKE 1 TAB FOUR TIMES PER WEEK, TAKE AN ADDITIONAL TAB FOR WEIGHT GAIN OF 2 LBS OVERNIGHT OR 5 LBS IN ONE WEEK   gabapentin (NEURONTIN) 100 MG capsule Take 1 capsule (100 mg total) by mouth 2 (two) times daily.   glucose blood (ONETOUCH VERIO) test strip 1 each by Other route as needed for other. Use as instructed   hydrALAZINE (APRESOLINE) 50 MG tablet Take 2 tablets (100 mg total) by mouth 3 (three)  times daily.   isosorbide dinitrate (ISORDIL) 20 MG tablet Take 2 tablet ( 40 mg total ) by mouth in morning and evening with Hydralazine 100 mg  dose   JANUVIA 25 MG tablet TAKE ONE TABLET BY MOUTH ONCE DAILY   lisinopril (ZESTRIL) 20 MG tablet TAKE 1 TABLET BY MOUTH ONCE DAILY WITH BREAKFAST   montelukast (SINGULAIR) 10 MG tablet Take 1 tablet (10 mg total) by mouth at bedtime.   OneTouch Delica Lancets 25K MISC Use to monitor blood sugars once daily as directed   No facility-administered encounter medications on file as of 09/17/2021.   Care Gaps: Influenza Vaccine Shingrix Vaccine   Star Rating Drugs: Atorvastatin 20 mg last filled 06/23/2021 90 day supply at [facility]. Lisinopril 20 mg last filled 06/23/2021 45 day supply at [facility].   Medication Fill Gaps: None ID  Reviewed chart for medication changes ahead of medication coordination call.  BP Readings from Last 3 Encounters:  08/23/21 (!) 146/82  08/06/21 (!) 148/78  07/22/21 (!) 179/80    Lab Results  Component Value Date   HGBA1C 6.0 (A) 07/14/2021     Patient obtains medications through Vials  90 Days   Last adherence delivery included:  Lisinopril 20 mg one tablet daily Montelukast 10 mg one tablet daily  Atorvastatin 20 mg one tablet daily Carvedilol 25 mg one tablet twice daily Gabapentin 100 mg one capsule 2 times  daily Flonase 50 MCG/ACT nasal spray  Hydralazine 50 mg one tablet 3 times daily  Isosorbide dinitrate 20 mg in the morning and evening with Hydralazine 50 mg dose  Patient declined medications last month: Furosemide 40 mg one tablet four times per week (Adequate supply)  Januvia 50 mg one tablet daily (Patient receives  assistance)  Aspirin 81 mg one tablet daily - OTC  Albuterol 108 MCG/ACT inhaler- PRN (Adequate supply)   Patient is due for next adherence delivery on: 09/28/2021. Called patient and reviewed medications and coordinated delivery.  This delivery to  include: Lisinopril 20 mg one tablet daily Montelukast 10 mg one tablet daily  Atorvastatin 20 mg one tablet daily Carvedilol 25 mg one tablet twice daily Gabapentin 100 mg one capsule 2 times daily Flonase 50 MCG/ACT nasal spray  Hydralazine 50 mg one tablet 3 times daily  Isosorbide dinitrate 20 mg in the morning and evening with Hydralazine 50 mg dose   Patient declined the following medications: Furosemide 40 mg one tablet four times per week (Adequate supply)  Januvia 50 mg one tablet daily (Patient receives  assistance)  Aspirin 81 mg one tablet daily - OTC  Albuterol 108 MCG/ACT inhaler- PRN (Adequate supply)   Patient needs refills for None ID.  Confirmed delivery date of 09/28/2021 (Second route) , advised patient that pharmacy will contact them the morning of delivery.  Patient states her Elenor Legato needle is "twisted" and she is unable to use it.Patient is asking what should she do. Notified Clinical pharmacist.   Per Clinical pharmacist, Request a replacement from [facility] at [phone].  Patient verbalized understanding.  [place] Pharmacist Assistant [phone]

## 2021-09-20 MED ORDER — ONETOUCH VERIO VI STRP: ORAL_STRIP | 3 refills | Status: DC

## 2021-09-20 NOTE — Addendum Note (Signed)
Addended by: Daron Offer A on: 09/20/2021 12:24 PM   Modules accepted: Orders

## 2021-09-21 ENCOUNTER — Encounter (INDEPENDENT_AMBULATORY_CARE_PROVIDER_SITE_OTHER): Payer: Self-pay | Admitting: Adult Health

## 2021-09-21 ENCOUNTER — Ambulatory Visit (INDEPENDENT_AMBULATORY_CARE_PROVIDER_SITE_OTHER): Payer: No Typology Code available for payment source | Admitting: Adult Health

## 2021-09-21 ENCOUNTER — Encounter: Payer: Self-pay | Admitting: Family Medicine

## 2021-09-21 ENCOUNTER — Encounter (INDEPENDENT_AMBULATORY_CARE_PROVIDER_SITE_OTHER): Payer: Self-pay

## 2021-09-21 ENCOUNTER — Ambulatory Visit (INDEPENDENT_AMBULATORY_CARE_PROVIDER_SITE_OTHER): Payer: No Typology Code available for payment source | Admitting: Family Medicine

## 2021-09-21 VITALS — BP 138/70 | HR 77 | Temp 97.9°F | Ht 68.0 in

## 2021-09-21 VITALS — BP 125/70 | HR 54 | Temp 98.2°F | Ht 68.0 in | Wt 355.0 lb

## 2021-09-21 DIAGNOSIS — J4521 Mild intermittent asthma with (acute) exacerbation: Secondary | ICD-10-CM | POA: Diagnosis not present

## 2021-09-21 DIAGNOSIS — B349 Viral infection, unspecified: Secondary | ICD-10-CM | POA: Diagnosis not present

## 2021-09-21 DIAGNOSIS — J45909 Unspecified asthma, uncomplicated: Secondary | ICD-10-CM | POA: Insufficient documentation

## 2021-09-21 DIAGNOSIS — Z6841 Body Mass Index (BMI) 40.0 and over, adult: Secondary | ICD-10-CM

## 2021-09-21 LAB — POC COVID19 BINAXNOW: SARS Coronavirus 2 Ag: NEGATIVE

## 2021-09-21 MED ORDER — PREDNISONE 20 MG PO TABS: 20.0000 mg | ORAL_TABLET | Freq: Two times a day (BID) | ORAL | 0 refills | Status: AC

## 2021-09-21 NOTE — Progress Notes (Signed)
Established Patient Office Visit  Subjective   Patient ID: Stacey Page, female    DOB: 10-28-1950  Age: 71 y.o. MRN: [id number]  Chief Complaint  Patient presents with   Sinus Problem    Sinus pressure, headaches x 3-4 days. Patient would like referral to ENT.     Sinus Problem Associated symptoms include congestion, coughing and a sore throat. Pertinent negatives include no chills, diaphoresis, headaches or shortness of breath.   presents with a 3 to 4-day history of frontal sinus pressure, nasal congestion postnasal drip sore throat nonproductive cough with increased tightness and wheezing.  There is been fatigue with malaise.  Denies fevers chills or headaches.  Denies myalgias or arthralgias.  There is a ringing in her ears.  She has been using her Flonase.  There has been increased albuterol use    Review of Systems  Constitutional:  Positive for malaise/fatigue. Negative for chills, diaphoresis, fever and weight loss.  HENT:  Positive for congestion and sore throat.   Eyes: Negative.  Negative for blurred vision and double vision.  Respiratory:  Positive for cough and wheezing. Negative for hemoptysis, sputum production and shortness of breath.   Cardiovascular:  Negative for chest pain.  Gastrointestinal:  Negative for abdominal pain.  Genitourinary: Negative.   Musculoskeletal:  Negative for falls, joint pain and myalgias.  Neurological:  Negative for speech change, loss of consciousness, weakness and headaches.  Psychiatric/Behavioral: Negative.        Objective:     BP 138/70 (BP Location: Left Arm, Patient Position: Sitting, Cuff Size: Large)   Pulse 77   Temp 97.9 F (36.6 C) (Temporal)   Ht '5\' 8"'$  (1.727 m)   BMI 53.98 kg/m    Physical Exam Constitutional:      General: She is not in acute distress.    Appearance: Normal appearance. She is not ill-appearing, toxic-appearing or diaphoretic.  HENT:     Head: Normocephalic and atraumatic.     Right  Ear: External ear normal.     Left Ear: External ear normal.  Eyes:     General: No scleral icterus.       Right eye: No discharge.        Left eye: No discharge.     Extraocular Movements: Extraocular movements intact.     Conjunctiva/sclera: Conjunctivae normal.  Cardiovascular:     Rate and Rhythm: Normal rate and regular rhythm.  Pulmonary:     Effort: Pulmonary effort is normal. No respiratory distress.     Breath sounds: Normal breath sounds. No wheezing or rales.  Musculoskeletal:     Cervical back: No rigidity or tenderness.  Skin:    General: Skin is warm and dry.  Neurological:     Mental Status: She is alert and oriented to person, place, and time.  Psychiatric:        Mood and Affect: Mood normal.        Behavior: Behavior normal.      No results found for any visits on 09/21/21.    The ASCVD Risk score (Arnett DK, et al., 2019) failed to calculate for the following reasons:   The valid total cholesterol range is 130 to 320 mg/dL    Assessment & Plan:   Problem List Items Addressed This Visit       Respiratory   Reactive airway disease   Relevant Medications   predniSONE (DELTASONE) 20 MG tablet     Other   Viral syndrome - Primary  Relevant Orders   POC COVID-19    Return in about 1 week (around 09/28/2021), or if symptoms worsen or fail to improve.    [name], MD

## 2021-09-22 ENCOUNTER — Telehealth: Payer: No Typology Code available for payment source

## 2021-09-22 NOTE — Telephone Encounter (Signed)
Per patient she will not be going through [facility] anymore so this message can be discontinued.

## 2021-09-27 ENCOUNTER — Other Ambulatory Visit: Payer: Self-pay | Admitting: Nurse Practitioner

## 2021-09-28 ENCOUNTER — Ambulatory Visit: Payer: Self-pay

## 2021-09-28 NOTE — Patient Instructions (Addendum)
Visit Information  Thank you for taking time to visit with me today. Please don't hesitate to contact me if I can be of assistance to you before our next scheduled telephone appointment.  Following are the goals we discussed today:  Continue to take medications as prescribed   Attend all scheduled provider appointments Call pharmacy for medication refills 3-7 days in advance of running out of medications Call provider office for new concerns or questions  weigh daily and record weights.  Notify your doctor if you gain 3 lbs overnight or 5 lbs in a week) We discussed importance of monitoring blood pressure with medication changes.  Please monitor blood pressure daily and record once blood pressure cuff received.  Continue to follow a low salt diet.  Review education article sent to you in Mychart on Hypertension   Our next appointment is by telephone on 11/03/21 at 11:00 am with Lazaro Arms, RN  Please call the care guide team at [phone] if you need to cancel or reschedule your appointment.   If you are experiencing a Mental Health or [place] or need someone to talk to, please call the Suicide and Crisis Lifeline: 988 call 1-800-273-TALK (toll free, 24 hour hotline)   Patient verbalizes understanding of instructions and care plan provided today and agrees to view in [place]. Active MyChart status and patient understanding of how to access instructions and care plan via MyChart confirmed with patient.     Quinn Plowman RN,BSN,CCM RN Care Manager Coordinator [phone]   Managing Your Hypertension Hypertension, also called high blood pressure, is when the force of the blood pressing against the walls of the arteries is too strong. Arteries are blood vessels that carry blood from your heart throughout your body. Hypertension forces the heart to work harder to pump blood and may cause the arteries to become narrow or stiff. Understanding blood pressure readings A  blood pressure reading includes a higher number over a lower number: The first, or top, number is called the systolic pressure. It is a measure of the pressure in your arteries as your heart beats. The second, or bottom number, is called the diastolic pressure. It is a measure of the pressure in your arteries as the heart relaxes. For most people, a normal blood pressure is below 120/80. Your personal target blood pressure may vary depending on your medical conditions, your age, and other factors. Blood pressure is classified into four stages. Based on your blood pressure reading, your health care provider may use the following stages to determine what type of treatment you need, if any. Systolic pressure and diastolic pressure are measured in a unit called millimeters of mercury (mmHg). Normal Systolic pressure: below 657. Diastolic pressure: below 80. Elevated Systolic pressure: 846-962. Diastolic pressure: below 80. Hypertension stage 1 Systolic pressure: 952-841. Diastolic pressure: 32-44. Hypertension stage 2 Systolic pressure: 010 or above. Diastolic pressure: 90 or above. How can this condition affect me? Managing your hypertension is very important. Over time, hypertension can damage the arteries and decrease blood flow to parts of the body, including the brain, heart, and kidneys. Having untreated or uncontrolled hypertension can lead to: A heart attack. A stroke. A weakened blood vessel (aneurysm). Heart failure. Kidney damage. Eye damage. Memory and concentration problems. Vascular dementia. What actions can I take to manage this condition? Hypertension can be managed by making lifestyle changes and possibly by taking medicines. Your health care provider will help you make a plan to bring your blood pressure within  a normal range. You may be referred for counseling on a healthy diet and physical activity. Nutrition  Eat a diet that is high in fiber and potassium, and low in  salt (sodium), added sugar, and fat. An example eating plan is called the DASH diet. DASH stands for Dietary Approaches to Stop Hypertension. To eat this way: Eat plenty of fresh fruits and vegetables. Try to fill one-half of your plate at each meal with fruits and vegetables. Eat whole grains, such as whole-wheat pasta, brown rice, or whole-grain bread. Fill about one-fourth of your plate with whole grains. Eat low-fat dairy products. Avoid fatty cuts of meat, processed or cured meats, and poultry with skin. Fill about one-fourth of your plate with lean proteins such as fish, chicken without skin, beans, eggs, and tofu. Avoid pre-made and processed foods. These tend to be higher in sodium, added sugar, and fat. Reduce your daily sodium intake. Many people with hypertension should eat less than 1,500 mg of sodium a day. Lifestyle  Work with your health care provider to maintain a healthy body weight or to lose weight. Ask what an ideal weight is for you. Get at least 30 minutes of exercise that causes your heart to beat faster (aerobic exercise) most days of the week. Activities may include walking, swimming, or biking. Include exercise to strengthen your muscles (resistance exercise), such as weight lifting, as part of your weekly exercise routine. Try to do these types of exercises for 30 minutes at least 3 days a week. Do not use any products that contain nicotine or tobacco. These products include cigarettes, chewing tobacco, and vaping devices, such as e-cigarettes. If you need help quitting, ask your health care provider. Control any long-term (chronic) conditions you have, such as high cholesterol or diabetes. Identify your sources of stress and find ways to manage stress. This may include meditation, deep breathing, or making time for fun activities. Alcohol use Do not drink alcohol if: Your health care provider tells you not to drink. You are pregnant, may be pregnant, or are planning to  become pregnant. If you drink alcohol: Limit how much you have to: 0-1 drink a day for women. 0-2 drinks a day for men. Know how much alcohol is in your drink. In the [place] one drink equals one 12 oz bottle of beer (355 mL), one 5 oz glass of wine (148 mL), or one 1 oz glass of hard liquor (44 mL). Medicines Your health care provider may prescribe medicine if lifestyle changes are not enough to get your blood pressure under control and if: Your systolic blood pressure is 130 or higher. Your diastolic blood pressure is 80 or higher. Take medicines only as told by your health care provider. Follow the directions carefully. Blood pressure medicines must be taken as told by your health care provider. The medicine does not work as well when you skip doses. Skipping doses also puts you at risk for problems. Monitoring Before you monitor your blood pressure: Do not smoke, drink caffeinated beverages, or exercise within 30 minutes before taking a measurement. Use the bathroom and empty your bladder (urinate). Sit quietly for at least 5 minutes before taking measurements. Monitor your blood pressure at home as told by your health care provider. To do this: Sit with your back straight and supported. Place your feet flat on the floor. Do not cross your legs. Support your arm on a flat surface, such as a table. Make sure your upper arm is at heart  level. Each time you measure, take two or three readings one minute apart and record the results. You may also need to have your blood pressure checked regularly by your health care provider. General information Talk with your health care provider about your diet, exercise habits, and other lifestyle factors that may be contributing to hypertension. Review all the medicines you take with your health care provider because there may be side effects or interactions. Keep all follow-up visits. Your health care provider can help you create and adjust your plan for  managing your high blood pressure. Where to find more information National Heart, Lung, and Blood Institute: https://wilson-eaton.com/ American Heart Association: www.heart.org Contact a health care provider if: You think you are having a reaction to medicines you have taken. You have repeated (recurrent) headaches. You feel dizzy. You have swelling in your ankles. You have trouble with your vision. Get help right away if: You develop a severe headache or confusion. You have unusual weakness or numbness, or you feel faint. You have severe pain in your chest or abdomen. You vomit repeatedly. You have trouble breathing. These symptoms may be an emergency. Get help right away. Call 911. Do not wait to see if the symptoms will go away. Do not drive yourself to the hospital. Summary Hypertension is when the force of blood pumping through your arteries is too strong. If this condition is not controlled, it may put you at risk for serious complications. Your personal target blood pressure may vary depending on your medical conditions, your age, and other factors. For most people, a normal blood pressure is less than 120/80. Hypertension is managed by lifestyle changes, medicines, or both. Lifestyle changes to help manage hypertension include losing weight, eating a healthy, low-sodium diet, exercising more, stopping smoking, and limiting alcohol. This information is not intended to replace advice given to you by your health care provider. Make sure you discuss any questions you have with your health care provider. Document Revised: 10/08/2020 Document Reviewed: 10/08/2020 Elsevier Patient Education  [place].

## 2021-09-28 NOTE — Chronic Care Management (AMB) (Signed)
Page Management    Stacey Page Visit Note  09/28/2021 Name: Stacey Page MRN: [id number] DOB: 06-Oct-1950  Subjective: Stacey Page is a 71 y.o. year old female who is a primary Page patient of [name], MD. The Page management team was consulted for assistance with disease management and Page coordination needs.    Engaged with patient by telephone for follow up visit in response to provider referral for case management and/or Page coordination services.   Consent to Services:   Stacey Page was given information about Page Management services today including:  Page Management services includes personalized support from designated clinical staff supervised by her physician, including individualized plan of Page and coordination with other Page providers 24/7 contact phone numbers for assistance for urgent and routine Page needs. The patient may stop case management services at any time by phone call to the office staff.  Patient agreed to services and consent obtained.   Assessment: Review of patient past medical history, allergies, medications, health status, including review of consultants reports, laboratory and other test data, was performed as part of comprehensive evaluation and provision of chronic Page management services.   SDOH (Social Determinants of Health) assessments and interventions performed:    Page Plan  No Known Allergies  Outpatient Encounter Medications as of 09/28/2021  Medication Sig   albuterol (PROAIR HFA) 108 (90 Base) MCG/ACT inhaler Inhale 1-2 puffs into the lungs every 6 (six) hours as needed for wheezing or shortness of breath.   aspirin 81 MG tablet Take 1 tablet (81 mg total) by mouth daily with breakfast.   atorvastatin (LIPITOR) 20 MG tablet TAKE 1 TABLET BY MOUTH  DAILY AT 6 PM.   Blood Glucose Monitoring Suppl ([place]) w/Device KIT 1 kit by Does not apply route daily as needed.   carvedilol (COREG) 25 MG tablet Take  1 tablet (25 mg total) by mouth 2 (two) times daily.   cholecalciferol (VITAMIN D3) 25 MCG (1000 UNIT) tablet Take 2,000 Units by mouth daily.   Continuous Blood Gluc Sensor (FREESTYLE LIBRE 14 DAY SENSOR) MISC Place one sensor on the skin every 14 days to monitor blood sugars continuously as directed.   Docusate Sodium 100 MG capsule Take 100 mg by mouth 2 (two) times daily.   ferrous sulfate 325 (65 FE) MG tablet Take 325 mg by mouth 2 (two) times daily with a meal.   fluticasone (FLONASE) 50 MCG/ACT nasal spray instill ONE SPRAY in each nostril daily   furosemide (LASIX) 40 MG tablet TAKE 1 TAB FOUR TIMES PER WEEK, TAKE AN ADDITIONAL TAB FOR WEIGHT GAIN OF 2 LBS OVERNIGHT OR 5 LBS IN ONE WEEK   gabapentin (NEURONTIN) 100 MG capsule Take 1 capsule (100 mg total) by mouth 2 (two) times daily.   glucose blood (ONETOUCH VERIO) test strip Use to monitor blood sugar twice daily as instructed   hydrALAZINE (APRESOLINE) 50 MG tablet Take 2 tablets (100 mg total) by mouth 3 (three) times daily.   isosorbide dinitrate (ISORDIL) 20 MG tablet Take 2 tablet ( 40 mg total ) by mouth in morning and evening with Hydralazine 100 mg  dose   JANUVIA 25 MG tablet TAKE ONE TABLET BY MOUTH ONCE DAILY   lisinopril (ZESTRIL) 20 MG tablet TAKE 1 TABLET BY MOUTH ONCE DAILY WITH BREAKFAST   montelukast (SINGULAIR) 10 MG tablet Take 1 tablet (10 mg total) by mouth at bedtime.   OneTouch Delica Lancets 79K MISC Use to monitor blood sugars once  daily as directed   predniSONE (DELTASONE) 20 MG tablet Take 1 tablet (20 mg total) by mouth 2 (two) times daily with a meal for 7 days.   No facility-administered encounter medications on file as of 09/28/2021.    Patient Active Problem List   Diagnosis Date Noted   Reactive airway disease 09/21/2021   Viral syndrome 09/21/2021   Hypertensive retinopathy of right eye, grade 1 08/11/2021   Grade 2 hypertensive retinopathy, left 08/11/2021   Nuclear sclerotic cataract of both  eyes 08/11/2021   Need for shingles vaccine 07/14/2021   Iron deficiency anemia 01/13/2021   CKD (chronic kidney disease) stage 4, GFR 15-29 ml/min ([place]) 07/09/2019   Diabetes mellitus type 2 in obese ([place]) 07/02/2019   Polyneuropathy associated with underlying disease ([place]) 11/28/2018   Severe episode of recurrent major depressive disorder, without psychotic features ([place]) 11/28/2018   Type 2 diabetes mellitus with diabetic peripheral angiopathy without gangrene, without long-term current use of insulin ([place]) 11/28/2018   Poor short term memory 11/28/2018   Mild aortic stenosis by prior echocardiogram 11/06/2018   Diabetes mellitus ([place]) 03/21/2017   Symptomatic anemia 03/17/2016   S/P total knee arthroplasty, right 03/17/2016   Unilateral primary osteoarthritis, right knee    Hyperlipidemia associated with type 2 diabetes mellitus ([place]) 12/15/2015   Pain in both lower extremities 11/19/2015   Numbness in both hands 08/13/2015   Panic attack 06/09/2015   Right knee pain 05/14/2015   Chronic renal insufficiency 09/16/2014   Pneumothorax on right    Atypical mycobacterium infection    ILD (interstitial lung disease) 2/2 MAI s/p med rxn    Class 3 severe obesity with serious comorbidity and body mass index (BMI) of 50.0 to 59.9 in adult ([place]) 04/03/2014   OSA (obstructive sleep apnea) 02/25/2014   Hypertensive heart disease with chronic diastolic congestive heart failure ([place]) 02/25/2014   DOE (dyspnea on exertion) 02/25/2014   Essential hypertension 02/25/2014    Conditions to be addressed/monitored: CHF, HTN, CKD and Neuropathy  Page Plan : Stacey Page  Updates made by Stacey Karvonen, Stacey Page since 09/28/2021 12:00 AM     Problem: Chronic disease management education and Page coordination needs.   Priority: High     Long-Range Goal: Development of plan of chronic Page to address chronic disease management and Page coodination needs.   Start Date: 03/24/2021  Expected End  Date: 10/07/2021  Priority: High  Note:   RESOLVING DUE TO DUPLICATE GOAL/ TRANSITIONING TO Page COORDINATION Current Barriers:  Knowledge Deficits related to plan of Page for management of CHF, HTN, and CKD Stage IV  Patient states she is doing well.  She reports having a follow up visit with her primary Page provider on 09/21/2021 due to cold symptoms. She states she was prescribed prednisone and is now doing much better. She states today is her last dose of prednisone.  Patient states she loves using her new Free style libre.  She reports instructions were provided by [facility] pharmacist.   Patient denies any heart failure symptoms.  Denies any falls. She reports provider adjusted her hypertensive medications.  She states she ordered a blood pressure monitor from [facility] but it does not fit.  Patient states she continues to follow a low salt diet and reports having a  follow up with nephrology in 1 week.  RNCM Clinical Goal(s):  Patient will verbalize understanding of plan for management of CHF, HTN, and CKD Stage IV as evidenced by patient self  report and/ or notation in chart take all medications exactly as prescribed and will call provider for medication related questions as evidenced by patient self report and/  or notation in chart    attend all scheduled medical appointments:  as evidenced by patient self report and / or notation in chart        continue to work with Stacey Page Page Manager and/or Social Worker to address Page management and Page coordination needs related to CHF, HTN, and CKD Stage IV as evidenced by adherence to CM Team Scheduled appointments    through collaboration with Consulting civil engineer, provider, and Page team.     Interventions: 1:1 collaboration with primary Page provider regarding development and update of comprehensive plan of Page as evidenced by provider attestation and co-signature Inter-disciplinary Page team collaboration (see longitudinal plan of Page) Evaluation  of current treatment plan related to  self management and patient's adherence to plan as established by provider  Neuropathy Interventions:  ( Status: Goal met.)  Discussed fall precautions / safety with patient Advised patient to re- discuss need for ambulatory device with provider.  Advised patient to be mindful when changing elevations when walking  Heart Failure Interventions:  (Status:  Goal met) Discussed importance of following low sodium diet Encouraged patient to weigh daily and record. Advised to notify her provider for weight gain of 3 lbs overnight or 5 lbs in a week.  Discussed the importance of keeping all appointments with provider Reviewed medications and discussed compliance.  Reviewed heart failure symptoms.    Chronic Kidney Disease Interventions:  (Status:  Goal met) Evaluation of current treatment plan related to chronic kidney disease self management and patient's adherence to plan as established by provider      Reviewed medications with patient and discussed importance of compliance    Reviewed scheduled/upcoming provider appointments  Advised to keep follow up visit with nephrologist.  Last practice recorded BP readings:  BP Readings from Last 3 Encounters:  09/21/21 138/70  09/21/21 125/70  08/23/21 (!) 146/82  Most recent eGFR/CrCl:  Lab Results  Component Value Date   EGFR 29 06/25/2021    No components found for: "CRCL"   Hypertension Interventions:  (Status:  Resolving due to duplicate goal  Last practice recorded BP readings:  BP Readings from Last 3 Encounters:  09/21/21 138/70  09/21/21 125/70  08/23/21 (!) 146/82  Most recent eGFR/CrCl:  Lab Results  Component Value Date   EGFR 29 06/25/2021    No components found for: "CRCL"  Evaluation of current treatment plan related to hypertension self management and patient's adherence to plan as established by provider Reviewed medications adjustments with patient and discussed importance of  compliance Discussed plans with patient for ongoing Page management follow up and provided patient with direct contact information for Page management team Reviewed scheduled/upcoming provider appointments  Advised patient to continue to follow low sodium diet.  Information sent to patient regarding blood pressure cuff and option to purchase on [place] to monitor blood pressure daily once blood pressure cuff obtained.  Education article sent to patient on hypertension  Patient Goals/Self-Page Activities: Continue to take medications as prescribed   Attend all scheduled provider appointments Call pharmacy for medication refills 3-7 days in advance of running out of medications Call provider office for new concerns or questions  weigh daily and record weights.  Notify your doctor if you gain 3 lbs overnight or 5 lbs in a week) We discussed importance of monitoring blood pressure  with medication changes.  Please monitor blood pressure daily and record once blood pressure cuff received.  Continue to follow a low salt diet.  Review education article sent to you in Mychart on Hypertension       Plan: The Page management team will reach out to the patient again over the next 1-2 months .  Stacey Plowman Stacey Page,BSN,CCM Stacey Page Page Manager Coordinator [phone]

## 2021-09-29 ENCOUNTER — Telehealth: Payer: Self-pay | Admitting: Family Medicine

## 2021-09-29 ENCOUNTER — Telehealth: Payer: No Typology Code available for payment source

## 2021-09-29 ENCOUNTER — Ambulatory Visit
Admission: RE | Admit: 2021-09-29 | Discharge: 2021-09-29 | Disposition: A | Discharge status: Home / Self Care | Payer: No Typology Code available for payment source | Source: Ambulatory Visit | Attending: Family Medicine | Admitting: Family Medicine

## 2021-09-29 DIAGNOSIS — Z1231 Encounter for screening mammogram for malignant neoplasm of breast: Secondary | ICD-10-CM | POA: Diagnosis not present

## 2021-09-29 DIAGNOSIS — N184 Chronic kidney disease, stage 4 (severe): Secondary | ICD-10-CM | POA: Diagnosis not present

## 2021-09-29 NOTE — Telephone Encounter (Signed)
Forms and order faxed

## 2021-09-29 NOTE — Telephone Encounter (Signed)
Caller Name: Janett Billow from [facility] Call back phone #: [phone]  Reason for Call: Stacey Page has filed a Tourist information centre manager because she has not received her diabetic shoes. The order can be faxed to [facility] fax # [phone]. [employer] has stated that they have not received an order.

## 2021-10-04 ENCOUNTER — Ambulatory Visit: Payer: No Typology Code available for payment source | Admitting: Podiatry

## 2021-10-04 DIAGNOSIS — D631 Anemia in chronic kidney disease: Secondary | ICD-10-CM | POA: Diagnosis not present

## 2021-10-04 DIAGNOSIS — I129 Hypertensive chronic kidney disease with stage 1 through stage 4 chronic kidney disease, or unspecified chronic kidney disease: Secondary | ICD-10-CM | POA: Diagnosis not present

## 2021-10-04 DIAGNOSIS — N2581 Secondary hyperparathyroidism of renal origin: Secondary | ICD-10-CM | POA: Diagnosis not present

## 2021-10-04 DIAGNOSIS — N184 Chronic kidney disease, stage 4 (severe): Secondary | ICD-10-CM | POA: Diagnosis not present

## 2021-10-04 DIAGNOSIS — E1122 Type 2 diabetes mellitus with diabetic chronic kidney disease: Secondary | ICD-10-CM | POA: Diagnosis not present

## 2021-10-04 DIAGNOSIS — I5032 Chronic diastolic (congestive) heart failure: Secondary | ICD-10-CM | POA: Diagnosis not present

## 2021-10-09 ENCOUNTER — Encounter (HOSPITAL_COMMUNITY): Payer: Self-pay

## 2021-10-09 ENCOUNTER — Encounter (HOSPITAL_BASED_OUTPATIENT_CLINIC_OR_DEPARTMENT_OTHER): Payer: Self-pay | Admitting: Emergency Medicine

## 2021-10-09 ENCOUNTER — Emergency Department (HOSPITAL_BASED_OUTPATIENT_CLINIC_OR_DEPARTMENT_OTHER): Payer: No Typology Code available for payment source

## 2021-10-09 ENCOUNTER — Emergency Department (HOSPITAL_BASED_OUTPATIENT_CLINIC_OR_DEPARTMENT_OTHER): Payer: No Typology Code available for payment source | Admitting: Radiology

## 2021-10-09 ENCOUNTER — Inpatient Hospital Stay (HOSPITAL_BASED_OUTPATIENT_CLINIC_OR_DEPARTMENT_OTHER)
Admission: EM | Admit: 2021-10-09 | Discharge: 2021-10-16 | DRG: 286 | Disposition: A | Discharge status: Home Health | Payer: No Typology Code available for payment source | Attending: Internal Medicine | Admitting: Internal Medicine

## 2021-10-09 ENCOUNTER — Other Ambulatory Visit: Payer: Self-pay

## 2021-10-09 DIAGNOSIS — E8729 Other acidosis: Secondary | ICD-10-CM | POA: Diagnosis not present

## 2021-10-09 DIAGNOSIS — K219 Gastro-esophageal reflux disease without esophagitis: Secondary | ICD-10-CM | POA: Diagnosis present

## 2021-10-09 DIAGNOSIS — Z79899 Other long term (current) drug therapy: Secondary | ICD-10-CM

## 2021-10-09 DIAGNOSIS — Z7982 Long term (current) use of aspirin: Secondary | ICD-10-CM

## 2021-10-09 DIAGNOSIS — Z87891 Personal history of nicotine dependence: Secondary | ICD-10-CM

## 2021-10-09 DIAGNOSIS — H35039 Hypertensive retinopathy, unspecified eye: Secondary | ICD-10-CM | POA: Diagnosis present

## 2021-10-09 DIAGNOSIS — Z825 Family history of asthma and other chronic lower respiratory diseases: Secondary | ICD-10-CM

## 2021-10-09 DIAGNOSIS — I509 Heart failure, unspecified: Secondary | ICD-10-CM | POA: Diagnosis not present

## 2021-10-09 DIAGNOSIS — I5033 Acute on chronic diastolic (congestive) heart failure: Secondary | ICD-10-CM | POA: Diagnosis present

## 2021-10-09 DIAGNOSIS — Z96651 Presence of right artificial knee joint: Secondary | ICD-10-CM | POA: Diagnosis present

## 2021-10-09 DIAGNOSIS — J9601 Acute respiratory failure with hypoxia: Secondary | ICD-10-CM | POA: Diagnosis present

## 2021-10-09 DIAGNOSIS — M7989 Other specified soft tissue disorders: Secondary | ICD-10-CM | POA: Diagnosis not present

## 2021-10-09 DIAGNOSIS — E1151 Type 2 diabetes mellitus with diabetic peripheral angiopathy without gangrene: Secondary | ICD-10-CM | POA: Diagnosis present

## 2021-10-09 DIAGNOSIS — I272 Pulmonary hypertension, unspecified: Secondary | ICD-10-CM | POA: Diagnosis present

## 2021-10-09 DIAGNOSIS — J9602 Acute respiratory failure with hypercapnia: Secondary | ICD-10-CM | POA: Diagnosis present

## 2021-10-09 DIAGNOSIS — F419 Anxiety disorder, unspecified: Secondary | ICD-10-CM | POA: Diagnosis present

## 2021-10-09 DIAGNOSIS — Z20822 Contact with and (suspected) exposure to covid-19: Secondary | ICD-10-CM | POA: Diagnosis present

## 2021-10-09 DIAGNOSIS — I350 Nonrheumatic aortic (valve) stenosis: Secondary | ICD-10-CM

## 2021-10-09 DIAGNOSIS — I700 Atherosclerosis of aorta: Secondary | ICD-10-CM | POA: Diagnosis present

## 2021-10-09 DIAGNOSIS — E611 Iron deficiency: Secondary | ICD-10-CM | POA: Diagnosis not present

## 2021-10-09 DIAGNOSIS — N182 Chronic kidney disease, stage 2 (mild): Secondary | ICD-10-CM | POA: Diagnosis present

## 2021-10-09 DIAGNOSIS — Z91148 Patient's other noncompliance with medication regimen for other reason: Secondary | ICD-10-CM

## 2021-10-09 DIAGNOSIS — I110 Hypertensive heart disease with heart failure: Secondary | ICD-10-CM | POA: Diagnosis present

## 2021-10-09 DIAGNOSIS — N179 Acute kidney failure, unspecified: Secondary | ICD-10-CM | POA: Diagnosis present

## 2021-10-09 DIAGNOSIS — I4589 Other specified conduction disorders: Secondary | ICD-10-CM | POA: Diagnosis present

## 2021-10-09 DIAGNOSIS — Z811 Family history of alcohol abuse and dependence: Secondary | ICD-10-CM

## 2021-10-09 DIAGNOSIS — R001 Bradycardia, unspecified: Secondary | ICD-10-CM | POA: Diagnosis not present

## 2021-10-09 DIAGNOSIS — I2489 Other forms of acute ischemic heart disease: Secondary | ICD-10-CM

## 2021-10-09 DIAGNOSIS — F32A Depression, unspecified: Secondary | ICD-10-CM | POA: Diagnosis present

## 2021-10-09 DIAGNOSIS — Z6372 Alcoholism and drug addiction in family: Secondary | ICD-10-CM

## 2021-10-09 DIAGNOSIS — I959 Hypotension, unspecified: Secondary | ICD-10-CM | POA: Diagnosis not present

## 2021-10-09 DIAGNOSIS — E1140 Type 2 diabetes mellitus with diabetic neuropathy, unspecified: Secondary | ICD-10-CM | POA: Diagnosis present

## 2021-10-09 DIAGNOSIS — N184 Chronic kidney disease, stage 4 (severe): Secondary | ICD-10-CM | POA: Diagnosis present

## 2021-10-09 DIAGNOSIS — R0602 Shortness of breath: Secondary | ICD-10-CM | POA: Diagnosis not present

## 2021-10-09 DIAGNOSIS — Z6841 Body Mass Index (BMI) 40.0 and over, adult: Secondary | ICD-10-CM

## 2021-10-09 DIAGNOSIS — E785 Hyperlipidemia, unspecified: Secondary | ICD-10-CM | POA: Diagnosis present

## 2021-10-09 DIAGNOSIS — E1169 Type 2 diabetes mellitus with other specified complication: Secondary | ICD-10-CM | POA: Diagnosis present

## 2021-10-09 DIAGNOSIS — I130 Hypertensive heart and chronic kidney disease with heart failure and stage 1 through stage 4 chronic kidney disease, or unspecified chronic kidney disease: Secondary | ICD-10-CM | POA: Diagnosis not present

## 2021-10-09 DIAGNOSIS — J449 Chronic obstructive pulmonary disease, unspecified: Secondary | ICD-10-CM | POA: Diagnosis present

## 2021-10-09 DIAGNOSIS — I248 Other forms of acute ischemic heart disease: Secondary | ICD-10-CM | POA: Diagnosis present

## 2021-10-09 DIAGNOSIS — G4733 Obstructive sleep apnea (adult) (pediatric): Secondary | ICD-10-CM | POA: Diagnosis present

## 2021-10-09 DIAGNOSIS — G9341 Metabolic encephalopathy: Secondary | ICD-10-CM | POA: Diagnosis not present

## 2021-10-09 DIAGNOSIS — J849 Interstitial pulmonary disease, unspecified: Secondary | ICD-10-CM | POA: Diagnosis present

## 2021-10-09 DIAGNOSIS — D649 Anemia, unspecified: Secondary | ICD-10-CM | POA: Diagnosis present

## 2021-10-09 DIAGNOSIS — E1122 Type 2 diabetes mellitus with diabetic chronic kidney disease: Secondary | ICD-10-CM | POA: Diagnosis present

## 2021-10-09 DIAGNOSIS — E876 Hypokalemia: Secondary | ICD-10-CM | POA: Diagnosis not present

## 2021-10-09 DIAGNOSIS — Z800 Family history of malignant neoplasm of digestive organs: Secondary | ICD-10-CM

## 2021-10-09 DIAGNOSIS — Z83438 Family history of other disorder of lipoprotein metabolism and other lipidemia: Secondary | ICD-10-CM

## 2021-10-09 LAB — CBC
HCT: 39.9 % (ref 36.0–46.0)
Hemoglobin: 11.8 g/dL — ABNORMAL LOW (ref 12.0–15.0)
MCH: 29.5 pg (ref 26.0–34.0)
MCHC: 29.6 g/dL — ABNORMAL LOW (ref 30.0–36.0)
MCV: 99.8 fL (ref 80.0–100.0)
Platelets: 160 10*3/uL (ref 150–400)
RBC: 4 MIL/uL (ref 3.87–5.11)
RDW: 16.6 % — ABNORMAL HIGH (ref 11.5–15.5)
WBC: 5.9 10*3/uL (ref 4.0–10.5)
nRBC: 0 % (ref 0.0–0.2)

## 2021-10-09 LAB — I-STAT VENOUS BLOOD GAS, ED
Acid-Base Excess: 2 mmol/L (ref 0.0–2.0)
Bicarbonate: 31.7 mmol/L — ABNORMAL HIGH (ref 20.0–28.0)
Calcium, Ion: 1.32 mmol/L (ref 1.15–1.40)
HCT: 37 % (ref 36.0–46.0)
Hemoglobin: 12.6 g/dL (ref 12.0–15.0)
O2 Saturation: 26 %
Patient temperature: 98.7
Potassium: 4.9 mmol/L (ref 3.5–5.1)
Sodium: 141 mmol/L (ref 135–145)
TCO2: 34 mmol/L — ABNORMAL HIGH (ref 22–32)
pCO2, Ven: 78.1 mmHg (ref 44–60)
pH, Ven: 7.216 — ABNORMAL LOW (ref 7.25–7.43)
pO2, Ven: 22 mmHg — CL (ref 32–45)

## 2021-10-09 LAB — RESP PANEL BY RT-PCR (FLU A&B, COVID) ARPGX2
Influenza A by PCR: NEGATIVE
Influenza B by PCR: NEGATIVE
SARS Coronavirus 2 by RT PCR: NEGATIVE

## 2021-10-09 LAB — BASIC METABOLIC PANEL
Anion gap: 6 (ref 5–15)
BUN: 50 mg/dL — ABNORMAL HIGH (ref 8–23)
CO2: 30 mmol/L (ref 22–32)
Calcium: 9.7 mg/dL (ref 8.9–10.3)
Chloride: 105 mmol/L (ref 98–111)
Creatinine, Ser: 3.35 mg/dL — ABNORMAL HIGH (ref 0.44–1.00)
GFR, Estimated: 14 mL/min — ABNORMAL LOW (ref >60)
Glucose, Bld: 125 mg/dL — ABNORMAL HIGH (ref 70–99)
Potassium: 4.8 mmol/L (ref 3.5–5.1)
Sodium: 141 mmol/L (ref 135–145)

## 2021-10-09 LAB — PROCALCITONIN: Procalcitonin: <0.1 ng/mL

## 2021-10-09 LAB — LACTIC ACID, PLASMA
Lactic Acid, Venous: 0.8 mmol/L (ref 0.5–1.9)
Lactic Acid, Venous: 0.7 mmol/L (ref 0.5–1.9)

## 2021-10-09 LAB — BRAIN NATRIURETIC PEPTIDE: B Natriuretic Peptide: 576.7 pg/mL — ABNORMAL HIGH (ref 0.0–100.0)

## 2021-10-09 LAB — TROPONIN I (HIGH SENSITIVITY)
Troponin I (High Sensitivity): 202 ng/L (ref <18)
Troponin I (High Sensitivity): 226 ng/L (ref <18)

## 2021-10-09 MED ORDER — SODIUM CHLORIDE 0.9 % IV BOLUS
250.0000 mL | Freq: Once | INTRAVENOUS | Status: AC
  Administered 2021-10-09: 250 mL via INTRAVENOUS

## 2021-10-09 MED ORDER — IPRATROPIUM-ALBUTEROL 0.5-2.5 (3) MG/3ML IN SOLN
3.0000 mL | Freq: Once | RESPIRATORY_TRACT | Status: AC
Start: 2021-10-09 — End: 2021-10-09
  Administered 2021-10-09: 3 mL via RESPIRATORY_TRACT
  Filled 2021-10-09: qty 3

## 2021-10-09 MED ORDER — FUROSEMIDE 10 MG/ML IJ SOLN
40.0000 mg | Freq: Once | INTRAMUSCULAR | Status: AC
  Administered 2021-10-09: 40 mg via INTRAVENOUS
  Filled 2021-10-09: qty 4

## 2021-10-09 NOTE — ED Notes (Signed)
Pt informed of need of urine specimen at this time.

## 2021-10-09 NOTE — ED Provider Notes (Signed)
[facility] EMERGENCY DEPT Provider Note   CSN: [id number] Arrival date & time: 10/09/21  1626     History  Chief Complaint  Patient presents with   Shortness of Breath    Stacey Page is a 71 y.o. female past medical history significant for obesity, obstructive sleep apnea, hypertension, chronic diastolic congestive heart failure, interstitial lung disease, COPD, renal insufficiency, hypertensive retinopathy who presents with concern for shortness of breath over the last few days, worsened over the last 2 days.  Patient reports that she has been having some worsening swelling in bilateral ankles.  She does take Lasix 40 mg every other day but reports that she missed 1 dose this week because she was traveling.  Patient denies any chest pain, chest tightness, does report that shortness of breath is somewhat worse with exertion.  Reports that leg swelling was worse over the last 2 days but is somewhat improved today.  She reports that she has been doing her breathing treatments at home.  She denies fever, chills, cough, sore throat but reports that she has been somewhat congested.   Shortness of Breath      Home Medications Prior to Admission medications   Medication Sig Start Date End Date Taking? Authorizing Provider  albuterol (PROAIR HFA) 108 (90 Base) MCG/ACT inhaler Inhale 1-2 puffs into the lungs every 6 (six) hours as needed for wheezing or shortness of breath. 06/04/21   [name], [name], NP  aspirin 81 MG tablet Take 1 tablet (81 mg total) by mouth daily with breakfast. 07/15/16   [name], [name], NP  atorvastatin (LIPITOR) 20 MG tablet TAKE 1 TABLET BY MOUTH  DAILY AT 6 PM. 02/16/21   [name], [name], NP  Blood Glucose Monitoring Suppl ([place]) w/Device KIT 1 kit by Does not apply route daily as needed. 07/22/21   [name], MD  carvedilol (COREG) 25 MG tablet Take 1 tablet (25 mg total) by mouth 2 (two) times daily.  02/16/21   Leonie Man, MD  cholecalciferol (VITAMIN D3) 25 MCG (1000 UNIT) tablet Take 2,000 Units by mouth daily.    [provider]  Continuous Blood Gluc Sensor (FREESTYLE LIBRE 14 DAY SENSOR) MISC Place one sensor on the skin every 14 days to monitor blood sugars continuously as directed. 08/26/21   [name], MD  Docusate Sodium 100 MG capsule Take 100 mg by mouth 2 (two) times daily.    [provider]  ferrous sulfate 325 (65 FE) MG tablet Take 325 mg by mouth 2 (two) times daily with a meal.    [provider]  fluticasone (FLONASE) 50 MCG/ACT nasal spray instill ONE SPRAY in each nostril daily 09/27/21   [name], [name], NP  furosemide (LASIX) 40 MG tablet TAKE 1 TAB FOUR TIMES PER WEEK, TAKE AN ADDITIONAL TAB FOR WEIGHT GAIN OF 2 LBS OVERNIGHT OR 5 LBS IN ONE WEEK 02/16/21   Leonie Man, MD  gabapentin (NEURONTIN) 100 MG capsule Take 1 capsule (100 mg total) by mouth 2 (two) times daily. 06/22/21   [name], [name], NP  glucose blood (ONETOUCH VERIO) test strip Use to monitor blood sugar twice daily as instructed 09/20/21   [name], MD  hydrALAZINE (APRESOLINE) 50 MG tablet Take 2 tablets (100 mg total) by mouth 3 (three) times daily. 08/06/21   Leonie Man, MD  isosorbide dinitrate (ISORDIL) 20 MG tablet Take 2 tablet ( 40 mg total ) by mouth in  morning and evening with Hydralazine 100 mg  dose 08/06/21   Leonie Man, MD  JANUVIA 25 MG tablet TAKE ONE TABLET BY MOUTH ONCE DAILY 05/19/21   [name], [name], NP  lisinopril (ZESTRIL) 20 MG tablet TAKE 1 TABLET BY MOUTH ONCE DAILY WITH BREAKFAST 02/16/21   [name], [name], NP  montelukast (SINGULAIR) 10 MG tablet Take 1 tablet (10 mg total) by mouth at bedtime. 02/16/21   [name], [name], NP  OneTouch Delica Lancets 98Y MISC Use to monitor blood sugars once daily as directed 08/05/21   [name], MD      Allergies    Patient has no known allergies.     Review of Systems   Review of Systems  Respiratory:  Positive for shortness of breath.   Cardiovascular:  Positive for leg swelling.  All other systems reviewed and are negative.   Physical Exam Updated Vital Signs BP (!) 111/43 (BP Location: Right Arm)   Pulse (!) 55   Temp 98.6 F (37 C) (Axillary)   Resp 17   SpO2 100%  Physical Exam Vitals and nursing note reviewed.  Constitutional:      General: She is not in acute distress.    Appearance: Normal appearance. She is obese.     Comments: Lethargic on arrival  HENT:     Head: Normocephalic and atraumatic.  Eyes:     General:        Right eye: No discharge.        Left eye: No discharge.  Cardiovascular:     Rate and Rhythm: Normal rate and regular rhythm.     Heart sounds: No murmur heard.    No friction rub. No gallop.  Pulmonary:     Effort: Pulmonary effort is normal.     Breath sounds: Normal breath sounds.     Comments: Patient has some crackles, wheezing at lung bases, but otherwise clear breath sounds, no focal consolidation noted.  Increased work of breathing. Abdominal:     General: Bowel sounds are normal.     Palpations: Abdomen is soft.  Musculoskeletal:     Right lower leg: Edema present.     Left lower leg: Edema present.     Comments: 2+ pitting edema bilateral lower extremities, no evidence of ulcers or other wounds  Skin:    General: Skin is warm and dry.     Capillary Refill: Capillary refill takes less than 2 seconds.  Neurological:     Mental Status: She is alert and oriented to person, place, and time.  Psychiatric:        Mood and Affect: Mood normal.        Behavior: Behavior normal.     ED Results / Procedures / Treatments   Labs (all labs ordered are listed, but only abnormal results are displayed) Labs Reviewed  CBC - Abnormal; Notable for the following components:      Result Value   Hemoglobin 11.8 (*)    MCHC 29.6 (*)    RDW 16.6 (*)    All other components within  normal limits  BASIC METABOLIC PANEL - Abnormal; Notable for the following components:   Glucose, Bld 125 (*)    BUN 50 (*)    Creatinine, Ser 3.35 (*)    GFR, Estimated 14 (*)    All other components within normal limits  BRAIN NATRIURETIC PEPTIDE - Abnormal; Notable for the following components:   B Natriuretic Peptide 576.7 (*)  All other components within normal limits  I-STAT VENOUS BLOOD GAS, ED - Abnormal; Notable for the following components:   pH, Ven 7.216 (*)    pCO2, Ven 78.1 (*)    pO2, Ven 22 (*)    Bicarbonate 31.7 (*)    TCO2 34 (*)    All other components within normal limits  TROPONIN I (HIGH SENSITIVITY) - Abnormal; Notable for the following components:   Troponin I (High Sensitivity) 202 (*)    All other components within normal limits  TROPONIN I (HIGH SENSITIVITY) - Abnormal; Notable for the following components:   Troponin I (High Sensitivity) 226 (*)    All other components within normal limits  RESP PANEL BY RT-PCR (FLU A&B, COVID) ARPGX2  LACTIC ACID, PLASMA  PROCALCITONIN  URINALYSIS, ROUTINE W REFLEX MICROSCOPIC  LACTIC ACID, PLASMA  PROCALCITONIN  BLOOD GAS, VENOUS    EKG EKG Interpretation  Date/Time:  Saturday October 09 2021 17:26:22 EDT Ventricular Rate:  53 PR Interval:  210 QRS Duration: 100 QT Interval:  444 QTC Calculation: 417 R Axis:   75 Text Interpretation: Sinus rhythm Abnormal R-wave progression, late transition Nonspecific T abnormalities, inferior leads Confirmed by Malvin Johns [phone]) on 10/09/2021 5:30:12 PM  Radiology US Venous Img Lower Bilateral  Result Date: 10/09/2021 CLINICAL DATA:  Bilateral ankle swelling. EXAM: BILATERAL LOWER EXTREMITY VENOUS DOPPLER ULTRASOUND TECHNIQUE: Gray-scale sonography with graded compression, as well as color Doppler and duplex ultrasound were performed to evaluate the lower extremity deep venous systems from the level of the common femoral vein and including the common femoral,  femoral, profunda femoral, popliteal and calf veins including the posterior tibial, peroneal and gastrocnemius veins when visible. The superficial great saphenous vein was also interrogated. Spectral Doppler was utilized to evaluate flow at rest and with distal augmentation maneuvers in the common femoral, femoral and popliteal veins. COMPARISON:  None Available. FINDINGS: RIGHT LOWER EXTREMITY Common Femoral Vein: No evidence of thrombus. Normal compressibility, respiratory phasicity and response to augmentation. Saphenofemoral Junction: No evidence of thrombus. Normal compressibility and flow on color Doppler imaging. Profunda Femoral Vein: No evidence of thrombus. Normal compressibility and flow on color Doppler imaging. Femoral Vein: No evidence of thrombus. Normal compressibility, respiratory phasicity and response to augmentation. Popliteal Vein: No evidence of thrombus. Normal compressibility, respiratory phasicity and response to augmentation. Calf Veins: No evidence of thrombus. Normal flow on color Doppler imaging. Compressibility is not well assessed due to patient's body habitus. Superficial Great Saphenous Vein: No evidence of thrombus. Normal compressibility. Venous Reflux:  None. Other Findings: Examination is limited due to patient's body habitus. Mild subcutaneous edema is noted at the ankle. LEFT LOWER EXTREMITY Common Femoral Vein: No evidence of thrombus. Normal compressibility, respiratory phasicity and response to augmentation. Saphenofemoral Junction: No evidence of thrombus. Normal compressibility and flow on color Doppler imaging. Profunda Femoral Vein: No evidence of thrombus. Normal compressibility and flow on color Doppler imaging. Femoral Vein: No evidence of thrombus. Normal compressibility, respiratory phasicity and response to augmentation. Popliteal Vein: No evidence of thrombus. Normal compressibility, respiratory phasicity and response to augmentation. Calf Veins: No evidence of  thrombus. Normal flow on color Doppler imaging. Compressibility is not well assessed due to patient's body habitus. Superficial Great Saphenous Vein: No evidence of thrombus. Normal compressibility. Venous Reflux:  None. Other Findings:  Examination limited due to patient's body habitus. IMPRESSION: No definite evidence of deep venous thrombosis in either lower extremity. Electronically Signed   By: Brett Fairy M.D.   On: 10/09/2021  21:16   DG Chest 2 View  Result Date: 10/09/2021 CLINICAL DATA:  Shortness of breath EXAM: CHEST - 2 VIEW COMPARISON:  08/12/2020 FINDINGS: Transverse diameter of heart is increased. Central pulmonary vessels are prominent. There are no signs of alveolar pulmonary edema or focal pulmonary consolidation. Posterior costophrenic angles are clear. There is no pneumothorax. IMPRESSION: Cardiomegaly. Central pulmonary vessels are prominent suggesting CHF. There is no focal pulmonary consolidation. Electronically Signed   By: Elmer Picker M.D.   On: 10/09/2021 18:00    Procedures .Critical Care  Performed by: Anselmo Pickler, PA-C Authorized by: Anselmo Pickler, PA-C   Critical care provider statement:    Critical care time (minutes):  75   Critical care was necessary to treat or prevent imminent or life-threatening deterioration of the following conditions:  Cardiac failure and renal failure   Critical care was time spent personally by me on the following activities:  Development of treatment plan with patient or surrogate, discussions with consultants, evaluation of patient's response to treatment, examination of patient, ordering and review of laboratory studies, ordering and review of radiographic studies, ordering and performing treatments and interventions, pulse oximetry, re-evaluation of patient's condition and review of old charts   Care discussed with: admitting provider       Medications Ordered in ED Medications  ipratropium-albuterol  (DUONEB) 0.5-2.5 (3) MG/3ML nebulizer solution 3 mL (3 mLs Nebulization Given 10/09/21 1827)  furosemide (LASIX) injection 40 mg (40 mg Intravenous Given 10/09/21 2006)    ED Course/ Medical Decision Making/ A&P Clinical Course as of 10/09/21 2210  Sat Oct 09, 2021  1831 Spoke with Dr. Johnsie Cancel with cardiology who recommends admission to medicine, potentially could start Lasix, I would like to wait and consult with hospital team, plus or minus nephrology given that she has significant worsening of her kidney function along with her elevated troponin.  She is not having any chest pain at this time, questionable ischemic new EKG changes.  Slight elevation of BNP compared to her baseline, she does have some signs of volume overload with lower extremity edema and some crackles at lung bases.  She came in with some hypoxia although she was having some decreased respiratory effort and lethargy, likely secondary to her uremia, we have been able to titrate her oxygen down over time with encouraging her to breathe, sitting up.  COVID, flu negative [CP]  1912 BP: 106/85 [MB]  2124 Troponin fairly flat compared to initial reading.  VBG with significant acidosis, pH 7.216, with bicarb deficit.  We will begin BiPAP, patient's blood pressures have been soft, labile, but not persistently low, she is not currently requiring pressor support, last check with BP systolic of 354, considered gentle fluid bolus, but will withhold at this time.  Lactic acid normal at 0.8, will consult critical care to discuss admission versus hospital admission at this time. [CP]    Clinical Course User Index [CP] Keith Cancio, Joesph Fillers, PA-C [MB] Malvin Johns, MD                           Medical Decision Making Amount and/or Complexity of Data Reviewed Labs: ordered. Radiology: ordered.  Risk Prescription drug management.   This patient is a 71 y.o. female who presents to the ED for concern of shortness of breath, worse with  exertion, worsening bilateral lower extremity edema after recent traveling, this involves an extensive number of treatment options, and is a complaint that carries  with it a high risk of complications and morbidity. The emergent differential diagnosis prior to evaluation includes, but is not limited to, ACS, AAS, PE, pneumonia, bronchitis, asthma or COPD exacerbation, acute cardiorenal syndrome, other viral or upper respiratory syndrome, heart failure exacerbation, respiratory acidosis, metabolic acidosis, shock, sepsis versus other.  This is not an exhaustive differential.   Past Medical History / Co-morbidities / Social History: obesity, obstructive sleep apnea, hypertension, chronic diastolic congestive heart failure, interstitial lung disease, COPD, renal insufficiency, hypertensive retinopathy  Additional history: Chart reviewed. Pertinent results include: Reviewed outpatient family medicine notes and cardiology visits including lab work, imaging  Physical Exam: Physical exam performed. The pertinent findings include: On arrival patient is somewhat lethargic, with low oxygen saturation, she did have acrylic nails on, after getting her oxygen saturation situated on appropriate finger, she had return of stable oxygen saturation on 6 L nasal cannula with good waveform, she was able to be weaned down to room air, but then was requiring at least 2 L for support, she does not wear any oxygen at home but does use a CPAP machine due to obstructive sleep apnea.  Throughout her stay she has had quite labile blood pressures with some systolic in the 1 29-937 range, with systolic dipping as low as the low 16R, with diastolic in the 67E to 93Y at the lowest.  She had 1 documented blood pressure of 98/32 to think may be fairly spurious on the diastolic value, nonetheless she is required extensive monitoring of her blood pressure.  Difficult situation given renal injury as well as known heart failure, concern for  some fluid overload based on swelling and x-ray, but clinically patient may be slightly fluid down.  She is afebrile, no significant change in leg swelling other than 1-2+ pitting edema she reports is slightly worse than baseline.  Lab Tests: I ordered, and personally interpreted labs.  The pertinent results include: RVP negative, CBC overall unremarkable, she has mild anemia with hemoglobin 11.8.  Her BMP is significant for BUN of 50, creatinine of 3.35, significant worsening on top of her chronic kidney disease, her baseline creatinine is around 1.9 with BUN in the 25 range, some of her lethargy on arrival may be secondary to uremia, however VBG also notable for pH of 7.216, bicarb of 31.7, she has signs symptoms of respiratory acidosis.  Notably also with initial troponin of 202, delta troponin of 226.  With no significant delta, no complaints of chest pain I am more suspicious of acute heart strain with troponin leak, demand NSTEMI then IM and acute occlusive event at this time.  Her lactic acid is reassuring at 0.8.   Imaging Studies: I ordered imaging studies including plain film chest x-ray, bilateral lower extremity ultrasound DVT study. I independently visualized and interpreted imaging which showed patient with some cardiomegaly, and pulmonary vascular congestion without evidence of acute focal consolidation or pleural effusion.  She has no evidence of DVT on ultrasound. I agree with the radiologist interpretation.   Cardiac Monitoring:  The patient was maintained on a cardiac monitor.  My attending physician Dr. Tamera Punt viewed and interpreted the cardiac monitored which showed an underlying rhythm of: NSR, nonspecific Twave abnormality, questionable slight st depression inferior leads. I agree with this interpretation.   Medications: I ordered medication including lasix for diuresis based on cardiology recommendations.  Patient will need extensive reevaluation during admission on her response to  treatment, it is unclear at this moment whether she will need fluid rehydration  with some diuresis, diuresis alone, or fluid rehydration alone she has a somewhat complicated clinical picture significant for acute kidney injury, evidence of heart strain, and some signs of fluid overload on exam. Patient with improved WOB after duoneb.  Consultations Obtained: I requested consultation with the cardiologist, spoke with Dr. Johnsie Cancel and discussed lab and imaging findings as well as pertinent plan - they recommend: admission, recommends starting with diuresis, but may need further evaluation of need for fluids vs diuresis based on clinical picture when assessed in the hospital.   Spoke with Dr. Posey Pronto with [facility] hospitalist team who recommended consulting with critical care due to patient's labile blood pressures, she had not several documented blood pressures less than 100 neuro prior to the speaking with the [facility] hospitalist.  I spoke with Dr. Chase Caller who recommended lactic acid, VBG, procalcitonin, although this is a send out lab so will not result while patient is in the emergency department and awaits callback.  Spoke with Dr. Chase Caller after VBG showed respiratory acidosis, with normal lactic, patient continuing to have some labile blood pressures but no need for pressor support.  We had hesitated on any fluid resuscitation due to patient's severe heart failure, cardiomegaly, and somewhat fluid overloaded appearance on exam, but he is suspicious that she may be slightly volume depleted.  He agrees to admission at this time, anticipates that she will likely be stepped down to the floor in the morning.   Disposition: After consideration of the diagnostic results and the patients response to treatment, I feel that Patient would benefit from admission for her multiple health issues today including demand NSTEMI, acute kidney injury, respiratory acidosis .   I discussed this case with my attending physician  Dr. Tamera Punt who cosigned this note including patient's presenting symptoms, physical exam, and planned diagnostics and interventions. Attending physician stated agreement with plan or made changes to plan which were implemented.    Attending physician assessed patient at bedside  Final Clinical Impression(s) / ED Diagnoses Final diagnoses:  Respiratory acidosis  Demand ischemia ([place])  Acute kidney injury [facility])    Rx / DC Orders ED Discharge Orders     None         Anselmo Pickler, PA-C 10/09/21 2210    Malvin Johns, MD 10/09/21 2257

## 2021-10-09 NOTE — ED Notes (Signed)
RT note: Pt. placed to room air after HHN administered, RN made aware.

## 2021-10-09 NOTE — ED Triage Notes (Signed)
Short of breath, swelling in bl ankles.  Started on Thursday. Normally takes lasix 3 x a week. Missed 1 dose this week because she was going out.

## 2021-10-09 NOTE — ED Notes (Signed)
RT/RN awaiting Covid results prior to Duoneb aerosol given per Protocol.

## 2021-10-09 NOTE — ED Notes (Signed)
[facility] called for transport spoke to [facility].

## 2021-10-09 NOTE — ED Notes (Signed)
RT note; Placed on to achieve Oxygen saturations >92%, RN made aware.

## 2021-10-09 NOTE — Progress Notes (Signed)
Patient is alert and talking to family.

## 2021-10-09 NOTE — ED Notes (Signed)
CRITICAL VALUE STICKER  CRITICAL VALUE:Troponin  RECEIVER (on-site recipient of call):Shawnie Pons, RN  DATE & TIME NOTIFIED:   MESSENGER (representative from lab):  MD NOTIFIED: P.A. Prosperi  TIME OF NOTIFICATION:1818  RESPONSE:

## 2021-10-09 NOTE — Progress Notes (Signed)
Patient is on a Servo U Non-invasive Ventilation.  PC 12 over PEEP, PEEP 6, Set rate 15, FIO2 40%.

## 2021-10-09 NOTE — H&P (Incomplete)
NAME:  Stacey Page, MRN:  [id number], DOB:  1950/08/13, LOS: 0 ADMISSION DATE:  10/09/2021, CONSULTATION DATE:  9/2 REFERRING MD:  Tamera Punt, CHIEF COMPLAINT:  SOB  History of Present Illness:  Stacey Page is an 71 y.o. F who presented to [facility] on 9/2 with a chief complaint of SOB  She has a past medical history significant for HFpEF, Mild AS, HTN, HLD, OSA, obesity, COPD, CKD IV, DM2  Patient reportedly takes lasix x3 times a week and missed 1 dose this week. Complaints of increased ankle edema. Of note she did go to primary care on 8/15 for a "Cold"  Presented to the ED lethargic. While in the ED she was found to be hypoxic with an SPO2 in the 80's and BP of 80/49. BNP 576, troponin 202 > 226. Creatinine 3.35 (baseline 1.87-1.9), BUN 50. VBG 7.21/78/22/31. Lactic acid 0.8. WBC 5.9. VBG 7.21/78/22/31. She was started on BiPAP. 23m of lasix given. No chest pain  PCCM was consulted for admission.   Pertinent  Medical History  HFpEF, Mild AS, HTN, HLD, OSA, obesity, COPD, CKD IV, DM2  Significant Hospital Events: Including procedures, antibiotic start and stop dates in addition to other pertinent events   9/2 presented to [facility]. Cardiology consult. [facility]   Interim History / Subjective:  See above  Subjective: denies chest pain, endorses SOB but not worse than it is always. Endorses missing lasix for trip. Had not started new medications that cardiologist had prescribed.   Objective   Blood pressure (!) 111/52, pulse (!) 54, temperature 97.9 F (36.6 C), temperature source Oral, resp. rate 14, SpO2 93 %. On 3L [place]    Vent Mode: PCV FiO2 (%):  [40 %] 40 % PEEP:  [6 cmH20] 6 cmH20   Intake/Output Summary (Last 24 hours) at 10/10/2021 0139 Last data filed at 10/09/2021 2340 Gross per 24 hour  Intake 250 ml  Output --  Net 250 ml   There were no vitals filed for this visit.  Examination: General: In bed, NAD, appears comfortable HEENT: MM pink/moist,  anicteric, atraumatic Neuro: RASS 0, PERRL 310m GCS 15 CV: S1S2, SB, no m/r/g appreciated PULM:  clear in the upper lobes, clear in the lower lobes, trachea midline, chest expansion symmetric GI: soft, bsx4 active, non-tender   Extremities: warm/dry, +1pretibial edema, capillary refill less than 3 seconds  Skin:  no rashes or lesions noted  Labs/imaging WBC 5.9 Hemoglobin 11.8 Creatinine 3.35 (baseline 1.87-1.9), BUN 50 COVID flu negative BNP 576, troponin 202 > 226 Lactic acid 0.8 Procalcitonin less than .10 VBG 7.21/78/22/31 LE doppler negative CXR: Cardiomegaly, no significant pulmonary edema, no pneumo, no significant effusion 12 lead: SR, t wave abnormalities in inferior leads   Resolved Hospital Problem list     Assessment & Plan:  Acute respiratory failure with hypoxia and hypercarbia OSA COPD VBG 7.21/78/22/31, LE doppler negative, COVID/FLU neg. Recent diagnosis of cold on 8/15. Lactic acid 0.8. WBC 5.9. Afebrile. No infiltrate on CXR. Intially on BiPAP. Mental status has improved on 3LNC.  [facility] -Goal SPO2 88 to 96% -Continue BiPAP overnight and with naps -As needed DuoNebs -Check RVP  Troponin elevation, suspect demand HFpEF Mild AS HX Hypertension  Hyperlipidemia BNP 576, troponin 202 > 226.  [facility] if [facility]. [facility]. BP has improved post diuresis. ? If recent cold/virus may have worsened  cardiac fxn. -Obtain echo [facility]. -Continuous telemetry -Repeat troponin at 5 AM -Hold home antihypertensives -Continue ASA, statin -Consider AM cardiology consult based on progress.  AKI on CKD IV Creatinine 3.35 (baseline 1.87-1.9), BUN 50 -Ensure renal perfusion. Goal MAP 65 or greater. -Avoid neprotoxic drugs as possible. -Strict I&O's -Follow up AM creatinine -place  foley  Normocytic anemia Hemoglobin 11.8 -Transfuse PRBC if HBG less than 7 -Obtain AM CBC to trend H&H -Monitor for signs of bleeding  DM2 -Blood Glucose goal 140-180. -SSI  Best Practice (right click and "Reselect all SmartList Selections" daily)   Diet/type: NPO w/ oral meds DVT prophylaxis: prophylactic heparin  GI prophylaxis: PPI Lines: N/A Foley:  N/A Code Status:  full code Last date of multidisciplinary goals of care discussion [pending]  Labs   CBC: Recent Labs  Lab 10/09/21 1722 10/09/21 2001  WBC 5.9  --   HGB 11.8* 12.6  HCT 39.9 37.0  MCV 99.8  --   PLT 160  --     Basic Metabolic Panel: Recent Labs  Lab 10/09/21 1722 10/09/21 2001  NA 141 141  K 4.8 4.9  CL 105  --   CO2 30  --   GLUCOSE 125*  --   BUN 50*  --   CREATININE 3.35*  --   CALCIUM 9.7  --    GFR: CrCl cannot be calculated (Unknown ideal weight.). Recent Labs  Lab 10/09/21 1722 10/09/21 1956  PROCALCITON  --  <0.10  WBC 5.9  --   LATICACIDVEN  --  0.7  0.8    Liver Function Tests: No results for input(s): "AST", "ALT", "ALKPHOS", "BILITOT", "PROT", "ALBUMIN" in the last 168 hours. No results for input(s): "LIPASE", "AMYLASE" in the last 168 hours. No results for input(s): "AMMONIA" in the last 168 hours.  ABG    Component Value Date/Time   PHART 7.291 (L) 06/11/2014 0500   PCO2ART 52.4 (H) 06/11/2014 0500   PO2ART 60.9 (L) 06/11/2014 0500   HCO3 31.7 (H) 10/09/2021 2001   TCO2 34 (H) 10/09/2021 2001   ACIDBASEDEF 1.2 06/11/2014 0500   O2SAT 26 10/09/2021 2001     Coagulation Profile: No results for input(s): "INR", "PROTIME" in the last 168 hours.  Cardiac Enzymes: No results for input(s): "CKTOTAL", "CKMB", "CKMBINDEX", "TROPONINI" in the last 168 hours.  HbA1C: Hemoglobin A1C  Date/Time Value Ref Range Status  07/14/2021 11:25 AM 6.0 (A) 4.0 - 5.6 % Final  04/14/2021 08:56 AM 6.4 (A) 4.0 - 5.6 % Final   Hgb A1c MFr Bld  Date/Time Value Ref Range  Status  10/14/2020 10:57 AM 6.1 (H) 4.8 - 5.6 % Final    Comment:             Prediabetes: 5.7 - 6.4          Diabetes: >6.4          Glycemic control for adults with diabetes: <7.0   05/14/2020 09:10 AM 6.1 4.6 - 6.5 % Final    Comment:    Glycemic Control Guidelines for People with Diabetes:Non Diabetic:  <6%Goal of Therapy: <7%Additional Action Suggested:  >8%     CBG: Recent Labs  Lab 10/10/21 0108  GLUCAP 104*    Review of Systems:   Positives in bold  Gen: fever, chills, weight change, fatigue, night sweats HEENT:  blurred vision, double vision, hearing loss, tinnitus, sinus congestion, rhinorrhea, sore throat, neck stiffness, dysphagia PULM:  shortness of breath, cough, sputum production, hemoptysis, wheezing CV: chest pain, edema, orthopnea, paroxysmal nocturnal dyspnea, palpitations, edema GI:  abdominal pain, nausea, vomiting, diarrhea, hematochezia, melena, constipation, change in bowel habits GU: dysuria, hematuria, polyuria, oliguria, urethral discharge Endocrine: hot or cold intolerance, polyuria, polyphagia or appetite change Derm: rash, dry skin, scaling or peeling skin change Heme: easy bruising, bleeding, bleeding gums Neuro: headache, numbness, weakness, slurred speech, loss of memory or consciousness   Past Medical History:  She,  has a past medical history of Anxiety, Arthritis of right knee (03/14/2016), Asthma, Back pain, Chronic diastolic CHF (congestive heart failure) ([place]), Depression, Diabetes ([place]), Eczema, GERD (gastroesophageal reflux disease), History of bronchitis as a child, HTN (hypertension), Insomnia, Mild aortic stenosis by prior echocardiogram (04/2017), Mycobacterium avium-intracellulare infection ([place]) (2016), OA (osteoarthritis), Obese, OSA (obstructive sleep apnea) (02/25/2014), Peripheral neuropathy, Pneumonia, Seasonal allergies, and Sleep apnea.   Surgical History:   Past Surgical History:  Procedure Laterality Date   BREAST BIOPSY  Right 03/2018   BREAST LUMPECTOMY WITH RADIOACTIVE SEED LOCALIZATION Right 12/28/2018   Procedure: RIGHT BREAST LUMPECTOMY WITH RADIOACTIVE SEED LOCALIZATION;  Surgeon: Jovita Kussmaul, MD;  Location: [place];  Service: General;  Laterality: Right;   BREAST SURGERY     CARDIOPULMONARY EXERCISE TEST (CPX)  06/21/2021   (Performed on Coreg 25 mg twice daily): PFTs: FVC 1.76 (60%), FEV1 1.59 (77%), ratio 114% => RESTRICTIVE PHYSIOLOGY; 5 min- 1 mph - 2% grade. 7/10 dyspnea -> pulse ox range during exercise 95 to 98%, low of 90%, 97% on recovery; BP 106/84-160/64; heart rate 64-90 bpm (60% MPHR) -> Chronotropic Incompetence;;MAIN FACTOR for Exercise Limitation = BODY HABITUS w/ Chronotropic Incompetence.   CESAREAN SECTION  x2   COLONOSCOPY     CORONARY CALCIUM SCORE & CT ANGIOGRAM  09/2017   Coronary Ca Score = 11 (low).  CTA- no obstructive CAD (minimal disease)   JOINT REPLACEMENT     KNEE ARTHROSCOPY Right    LUNG BIOPSY Right 06/10/2014   Procedure: LUNG BIOPSY;  Surgeon: Ivin Poot, MD;  Location: [place];  Service: Thoracic;  Laterality: Right;   POLYPECTOMY     throat   TOTAL KNEE ARTHROPLASTY Right 03/14/2016   Procedure: RIGHT TOTAL KNEE ARTHROPLASTY;  Surgeon: Marybelle Killings, MD;  Location: [place];  Service: Orthopedics;  Laterality: Right;   TRANSTHORACIC ECHOCARDIOGRAM  11/04/2019   EF 60-65%.  Moderate LVH.  GRII DD.  Mild LV dilation.  Mild LA dilation.  Thickened-calcified aortic valve-Mild AS (mean gradient 13 mmHg, peak 29 mmHg--similar to 2019)   TRANSTHORACIC ECHOCARDIOGRAM  06/14/2021   EF 60 to 65%.  No RWMA.  Moderate cLVH with GR 1 DD.  Mild LA dilation.  Mild to moderate aortic calcification/stenosis.  Mean AVG 18 mmHg.   VIDEO ASSISTED THORACOSCOPY Right 06/10/2014   Procedure: VIDEO ASSISTED THORACOSCOPY;  Surgeon: Ivin Poot, MD;  Location: [facility];  Service: Thoracic;  Laterality: Right;     Social History:   reports that she quit smoking about 41 years ago. Her  smoking use included cigarettes. She has a 3.75 pack-year smoking history. She has quit using smokeless tobacco. She reports current alcohol use. She reports that she does not currently use drugs.   Family History:  Her family history includes Alcoholism in her father; Anemia in her father; COPD in her father; Cancer (age of onset: 76) in her mother; High Cholesterol in her mother; High blood pressure in her father and mother; Hypothyroidism in her father. There  is no history of Breast cancer.   Allergies No Known Allergies   Home Medications  Prior to Admission medications   Medication Sig Start Date End Date Taking? Authorizing Provider  albuterol (PROAIR HFA) 108 (90 Base) MCG/ACT inhaler Inhale 1-2 puffs into the lungs every 6 (six) hours as needed for wheezing or shortness of breath. 06/04/21   [name], [name], NP  aspirin 81 MG tablet Take 1 tablet (81 mg total) by mouth daily with breakfast. 07/15/16   [name], [name], NP  atorvastatin (LIPITOR) 20 MG tablet TAKE 1 TABLET BY MOUTH  DAILY AT 6 PM. 02/16/21   [name], [name], NP  Blood Glucose Monitoring Suppl ([place]) w/Device KIT 1 kit by Does not apply route daily as needed. 07/22/21   [name], MD  carvedilol (COREG) 25 MG tablet Take 1 tablet (25 mg total) by mouth 2 (two) times daily. 02/16/21   Leonie Man, MD  cholecalciferol (VITAMIN D3) 25 MCG (1000 UNIT) tablet Take 2,000 Units by mouth daily.    [provider]  Continuous Blood Gluc Sensor (FREESTYLE LIBRE 14 DAY SENSOR) MISC Place one sensor on the skin every 14 days to monitor blood sugars continuously as directed. 08/26/21   [name], MD  Docusate Sodium 100 MG capsule Take 100 mg by mouth 2 (two) times daily.    [provider]  ferrous sulfate 325 (65 FE) MG tablet Take 325 mg by mouth 2 (two) times daily with a meal.    [provider]  fluticasone (FLONASE) 50 MCG/ACT nasal spray instill  ONE SPRAY in each nostril daily 09/27/21   [name], [name], NP  furosemide (LASIX) 40 MG tablet TAKE 1 TAB FOUR TIMES PER WEEK, TAKE AN ADDITIONAL TAB FOR WEIGHT GAIN OF 2 LBS OVERNIGHT OR 5 LBS IN ONE WEEK 02/16/21   Leonie Man, MD  gabapentin (NEURONTIN) 100 MG capsule Take 1 capsule (100 mg total) by mouth 2 (two) times daily. 06/22/21   [name], [name], NP  glucose blood (ONETOUCH VERIO) test strip Use to monitor blood sugar twice daily as instructed 09/20/21   [name], MD  hydrALAZINE (APRESOLINE) 50 MG tablet Take 2 tablets (100 mg total) by mouth 3 (three) times daily. 08/06/21   Leonie Man, MD  isosorbide dinitrate (ISORDIL) 20 MG tablet Take 2 tablet ( 40 mg total ) by mouth in morning and evening with Hydralazine 100 mg  dose 08/06/21   Leonie Man, MD  JANUVIA 25 MG tablet TAKE ONE TABLET BY MOUTH ONCE DAILY 05/19/21   [name], [name], NP  lisinopril (ZESTRIL) 20 MG tablet TAKE 1 TABLET BY MOUTH ONCE DAILY WITH BREAKFAST 02/16/21   [name], [name], NP  montelukast (SINGULAIR) 10 MG tablet Take 1 tablet (10 mg total) by mouth at bedtime. 02/16/21   [name], [name], NP  OneTouch Delica Lancets 16B MISC Use to monitor blood sugars once daily as directed 08/05/21   [name], MD     Critical care time: 40 minutes    The patient is critically ill with multiple organ systems failure and requires high complexity decision making for assessment and support, frequent evaluation and titration of therapies, application of advanced monitoring technologies and extensive interpretation of multiple databases.    Critical Care Time devoted to patient care services described in this note is 40 minutes. This time reflects time of care of this [place] NP. This critical  care time does not reflect procedure time but could involve care discussion time with the PCCM attending.  Redmond School., MSN, APRN, AGACNP-BC [facility]  10/10/2021 , 1:47 AM  Please see [email or web address] for pager details  If no response, please call [phone] After hours, please call Elink at [phone]

## 2021-10-09 NOTE — ED Notes (Signed)
RT note: Pt. seen upon arrival in Triage -1 with family member at side, RT assessment obtained.

## 2021-10-10 ENCOUNTER — Inpatient Hospital Stay (HOSPITAL_COMMUNITY): Payer: No Typology Code available for payment source

## 2021-10-10 DIAGNOSIS — I5033 Acute on chronic diastolic (congestive) heart failure: Secondary | ICD-10-CM | POA: Diagnosis not present

## 2021-10-10 DIAGNOSIS — N184 Chronic kidney disease, stage 4 (severe): Secondary | ICD-10-CM | POA: Diagnosis not present

## 2021-10-10 DIAGNOSIS — E8729 Other acidosis: Secondary | ICD-10-CM | POA: Diagnosis not present

## 2021-10-10 DIAGNOSIS — I959 Hypotension, unspecified: Secondary | ICD-10-CM | POA: Diagnosis not present

## 2021-10-10 DIAGNOSIS — J449 Chronic obstructive pulmonary disease, unspecified: Secondary | ICD-10-CM | POA: Diagnosis not present

## 2021-10-10 DIAGNOSIS — H35039 Hypertensive retinopathy, unspecified eye: Secondary | ICD-10-CM | POA: Diagnosis not present

## 2021-10-10 DIAGNOSIS — N179 Acute kidney failure, unspecified: Secondary | ICD-10-CM

## 2021-10-10 DIAGNOSIS — I248 Other forms of acute ischemic heart disease: Secondary | ICD-10-CM | POA: Diagnosis not present

## 2021-10-10 DIAGNOSIS — G4733 Obstructive sleep apnea (adult) (pediatric): Secondary | ICD-10-CM | POA: Diagnosis not present

## 2021-10-10 DIAGNOSIS — J9601 Acute respiratory failure with hypoxia: Secondary | ICD-10-CM | POA: Diagnosis not present

## 2021-10-10 DIAGNOSIS — I5032 Chronic diastolic (congestive) heart failure: Secondary | ICD-10-CM | POA: Diagnosis not present

## 2021-10-10 DIAGNOSIS — I130 Hypertensive heart and chronic kidney disease with heart failure and stage 1 through stage 4 chronic kidney disease, or unspecified chronic kidney disease: Secondary | ICD-10-CM | POA: Diagnosis not present

## 2021-10-10 DIAGNOSIS — I350 Nonrheumatic aortic (valve) stenosis: Secondary | ICD-10-CM | POA: Diagnosis not present

## 2021-10-10 DIAGNOSIS — N182 Chronic kidney disease, stage 2 (mild): Secondary | ICD-10-CM | POA: Diagnosis not present

## 2021-10-10 DIAGNOSIS — F32A Depression, unspecified: Secondary | ICD-10-CM | POA: Diagnosis not present

## 2021-10-10 DIAGNOSIS — I272 Pulmonary hypertension, unspecified: Secondary | ICD-10-CM | POA: Diagnosis not present

## 2021-10-10 DIAGNOSIS — J849 Interstitial pulmonary disease, unspecified: Secondary | ICD-10-CM | POA: Diagnosis not present

## 2021-10-10 DIAGNOSIS — E1151 Type 2 diabetes mellitus with diabetic peripheral angiopathy without gangrene: Secondary | ICD-10-CM | POA: Diagnosis not present

## 2021-10-10 DIAGNOSIS — Z20822 Contact with and (suspected) exposure to covid-19: Secondary | ICD-10-CM | POA: Diagnosis not present

## 2021-10-10 DIAGNOSIS — I110 Hypertensive heart disease with heart failure: Secondary | ICD-10-CM | POA: Diagnosis not present

## 2021-10-10 DIAGNOSIS — I700 Atherosclerosis of aorta: Secondary | ICD-10-CM | POA: Diagnosis not present

## 2021-10-10 DIAGNOSIS — E1169 Type 2 diabetes mellitus with other specified complication: Secondary | ICD-10-CM | POA: Diagnosis not present

## 2021-10-10 DIAGNOSIS — E785 Hyperlipidemia, unspecified: Secondary | ICD-10-CM | POA: Diagnosis not present

## 2021-10-10 DIAGNOSIS — J9602 Acute respiratory failure with hypercapnia: Secondary | ICD-10-CM

## 2021-10-10 DIAGNOSIS — D649 Anemia, unspecified: Secondary | ICD-10-CM | POA: Diagnosis not present

## 2021-10-10 DIAGNOSIS — I509 Heart failure, unspecified: Secondary | ICD-10-CM | POA: Diagnosis not present

## 2021-10-10 DIAGNOSIS — G9341 Metabolic encephalopathy: Secondary | ICD-10-CM | POA: Diagnosis not present

## 2021-10-10 DIAGNOSIS — I4589 Other specified conduction disorders: Secondary | ICD-10-CM | POA: Diagnosis not present

## 2021-10-10 DIAGNOSIS — E1122 Type 2 diabetes mellitus with diabetic chronic kidney disease: Secondary | ICD-10-CM | POA: Diagnosis not present

## 2021-10-10 DIAGNOSIS — I5031 Acute diastolic (congestive) heart failure: Secondary | ICD-10-CM | POA: Diagnosis not present

## 2021-10-10 DIAGNOSIS — Z6841 Body Mass Index (BMI) 40.0 and over, adult: Secondary | ICD-10-CM | POA: Diagnosis not present

## 2021-10-10 HISTORY — PX: TRANSTHORACIC ECHOCARDIOGRAM: SHX275

## 2021-10-10 LAB — GLUCOSE, CAPILLARY
Glucose-Capillary: 104 mg/dL — ABNORMAL HIGH (ref 70–99)
Glucose-Capillary: 139 mg/dL — ABNORMAL HIGH (ref 70–99)
Glucose-Capillary: 125 mg/dL — ABNORMAL HIGH (ref 70–99)
Glucose-Capillary: 86 mg/dL (ref 70–99)
Glucose-Capillary: 105 mg/dL — ABNORMAL HIGH (ref 70–99)
Glucose-Capillary: 114 mg/dL — ABNORMAL HIGH (ref 70–99)

## 2021-10-10 LAB — CBC
HCT: 37.7 % (ref 36.0–46.0)
Hemoglobin: 11.1 g/dL — ABNORMAL LOW (ref 12.0–15.0)
MCH: 29.3 pg (ref 26.0–34.0)
MCHC: 29.4 g/dL — ABNORMAL LOW (ref 30.0–36.0)
MCV: 99.5 fL (ref 80.0–100.0)
Platelets: 144 10*3/uL — ABNORMAL LOW (ref 150–400)
RBC: 3.79 MIL/uL — ABNORMAL LOW (ref 3.87–5.11)
RDW: 16.6 % — ABNORMAL HIGH (ref 11.5–15.5)
WBC: 5.8 10*3/uL (ref 4.0–10.5)
nRBC: 0 % (ref 0.0–0.2)

## 2021-10-10 LAB — ECHOCARDIOGRAM COMPLETE
AR max vel: 1.74 cm2
AV Area VTI: 1.88 cm2
AV Area mean vel: 1.71 cm2
AV Mean grad: 23 mmHg
AV Peak grad: 38.9 mmHg
Ao pk vel: 3.12 m/s
Height: 68 in
S' Lateral: 2.3 cm
Weight: 5767.23 oz

## 2021-10-10 LAB — BASIC METABOLIC PANEL
Anion gap: 5 (ref 5–15)
BUN: 48 mg/dL — ABNORMAL HIGH (ref 8–23)
CO2: 29 mmol/L (ref 22–32)
Calcium: 8.8 mg/dL — ABNORMAL LOW (ref 8.9–10.3)
Chloride: 108 mmol/L (ref 98–111)
Creatinine, Ser: 3.24 mg/dL — ABNORMAL HIGH (ref 0.44–1.00)
GFR, Estimated: 15 mL/min — ABNORMAL LOW (ref >60)
Glucose, Bld: 128 mg/dL — ABNORMAL HIGH (ref 70–99)
Potassium: 4.8 mmol/L (ref 3.5–5.1)
Sodium: 142 mmol/L (ref 135–145)

## 2021-10-10 LAB — MAGNESIUM: Magnesium: 2.5 mg/dL — ABNORMAL HIGH (ref 1.7–2.4)

## 2021-10-10 LAB — URINALYSIS, ROUTINE W REFLEX MICROSCOPIC
Bilirubin Urine: NEGATIVE
Glucose, UA: NEGATIVE mg/dL
Hgb urine dipstick: NEGATIVE
Ketones, ur: NEGATIVE mg/dL
Leukocytes,Ua: NEGATIVE
Nitrite: NEGATIVE
Protein, ur: NEGATIVE mg/dL
Specific Gravity, Urine: 1.01 (ref 1.005–1.030)
pH: 5 (ref 5.0–8.0)

## 2021-10-10 LAB — TROPONIN I (HIGH SENSITIVITY): Troponin I (High Sensitivity): 224 ng/L (ref <18)

## 2021-10-10 LAB — PROCALCITONIN: Procalcitonin: <0.1 ng/mL

## 2021-10-10 LAB — MRSA NEXT GEN BY PCR, NASAL: MRSA by PCR Next Gen: NOT DETECTED

## 2021-10-10 MED ORDER — PANTOPRAZOLE SODIUM 40 MG PO TBEC
40.0000 mg | DELAYED_RELEASE_TABLET | Freq: Every day | ORAL | Status: DC
  Administered 2021-10-10 – 2021-10-16 (×7): 40 mg via ORAL
  Filled 2021-10-10 (×7): qty 1

## 2021-10-10 MED ORDER — ORAL CARE MOUTH RINSE: 15.0000 mL | OROMUCOSAL | Status: DC | PRN

## 2021-10-10 MED ORDER — FUROSEMIDE 10 MG/ML IJ SOLN
80.0000 mg | Freq: Two times a day (BID) | INTRAMUSCULAR | Status: DC
  Administered 2021-10-10 – 2021-10-14 (×10): 80 mg via INTRAVENOUS
  Filled 2021-10-10 (×10): qty 8

## 2021-10-10 MED ORDER — IPRATROPIUM-ALBUTEROL 0.5-2.5 (3) MG/3ML IN SOLN
3.0000 mL | RESPIRATORY_TRACT | Status: DC | PRN
Start: 2021-10-10 — End: 2021-10-16
  Administered 2021-10-10: 3 mL via RESPIRATORY_TRACT
  Filled 2021-10-10: qty 3

## 2021-10-10 MED ORDER — INSULIN ASPART 100 UNIT/ML IJ SOLN
0.0000 [IU] | INTRAMUSCULAR | Status: DC
  Administered 2021-10-10 – 2021-10-11 (×3): 2 [IU] via SUBCUTANEOUS

## 2021-10-10 MED ORDER — ASPIRIN 81 MG PO CHEW
81.0000 mg | CHEWABLE_TABLET | Freq: Every day | ORAL | Status: DC
  Administered 2021-10-10 – 2021-10-16 (×7): 81 mg via ORAL
  Filled 2021-10-10 (×7): qty 1

## 2021-10-10 MED ORDER — ORAL CARE MOUTH RINSE
15.0000 mL | OROMUCOSAL | Status: DC
  Administered 2021-10-10 – 2021-10-16 (×15): 15 mL via OROMUCOSAL

## 2021-10-10 MED ORDER — FUROSEMIDE 10 MG/ML IJ SOLN
40.0000 mg | Freq: Once | INTRAMUSCULAR | Status: DC
Start: 2021-10-10 — End: 2021-10-10

## 2021-10-10 MED ORDER — ATORVASTATIN CALCIUM 10 MG PO TABS
20.0000 mg | ORAL_TABLET | Freq: Every day | ORAL | Status: DC
  Administered 2021-10-10 – 2021-10-16 (×7): 20 mg via ORAL
  Filled 2021-10-10 (×7): qty 2

## 2021-10-10 MED ORDER — FUROSEMIDE 10 MG/ML IJ SOLN
40.0000 mg | Freq: Once | INTRAMUSCULAR | Status: AC
Start: 2021-10-10 — End: 2021-10-10
  Administered 2021-10-10: 40 mg via INTRAVENOUS
  Filled 2021-10-10: qty 4

## 2021-10-10 MED ORDER — HEPARIN SODIUM (PORCINE) 5000 UNIT/ML IJ SOLN
5000.0000 [IU] | Freq: Three times a day (TID) | INTRAMUSCULAR | Status: DC
  Administered 2021-10-10 – 2021-10-15 (×15): 5000 [IU] via SUBCUTANEOUS
  Filled 2021-10-10 (×16): qty 1

## 2021-10-10 MED ORDER — CHLORHEXIDINE GLUCONATE CLOTH 2 % EX PADS
6.0000 | MEDICATED_PAD | Freq: Every day | CUTANEOUS | Status: DC
Start: 2021-10-10 — End: 2021-10-13
  Administered 2021-10-10 – 2021-10-12 (×3): 6 via TOPICAL

## 2021-10-10 NOTE — Plan of Care (Signed)

## 2021-10-10 NOTE — Progress Notes (Signed)
PROGRESS NOTE    Stacey Page  KZS:[id number] DOB: 1950/07/08 DOA: 10/09/2021 PCP: [name], MD   Brief Narrative: No notes on file   Assessment and Plan:  Acute respiratory failure with hypoxia and hypercapnia ***  Acute on chronic diastolic heart failure ***. Transthoracic Echocardiogram obtained on 9/3 significant for preserved LVEF with no RWMA and moderate LV hypertrophy. -***  AKI on CKD stage IV Baseline creatinine appears to be around 1.8-2. Creatinine of 3.35 on admission in setting of fluid overload. Creatinine down to *** with Lasix IV. -***  Elevated troponin ***  Primary hypertension Patent is on Coreg, hydralazine, isosorbide dinitrate and lisinopril as an outpatient -***  COPD ***  OSA ***  Aortic stenosis Moderate stenosis on most recent Transthoracic Echocardiogram.  Hyperlipidemia ***  Normocytic anemia ***  Diabetes mellitus type 2 Diabetes is controlled. Most recent hemoglobin A1C of 6.0%. Patient is on Januvia as an outpatient. -Continue SSI  DVT prophylaxis: *** Code Status:   Code Status: Prior Family Communication: *** Disposition Plan: ***   Consultants:  ***  Procedures:  ***  Antimicrobials: ***    Subjective: ***  Objective: BP (!) 107/53   Pulse (!) 52   Temp 97.7 F (36.5 C) (Oral)   Resp 16   Ht '5\' 8"'$  (1.727 m)   Wt (!) 163.5 kg   SpO2 100%   BMI 54.81 kg/m   Examination:  General exam: Appears calm and comfortable *** Respiratory system: Clear to auscultation. Respiratory effort normal. Cardiovascular system: S1 & S2 heard, RRR. No murmurs, rubs, gallops or clicks. Gastrointestinal system: Abdomen is nondistended, soft and nontender. No organomegaly or masses felt. Normal bowel sounds heard. Central nervous system: Alert and oriented. No focal neurological deficits. Musculoskeletal: No edema. No calf tenderness Skin: No cyanosis. No rashes Psychiatry: Judgement and insight  appear normal. Mood & affect appropriate.    Data Reviewed: I have personally reviewed following labs and imaging studies  CBC Lab Results  Component Value Date   WBC 5.8 10/10/2021   RBC 3.79 (L) 10/10/2021   HGB 11.1 (L) 10/10/2021   HCT 37.7 10/10/2021   MCV 99.5 10/10/2021   MCH 29.3 10/10/2021   PLT 144 (L) 10/10/2021   MCHC 29.4 (L) 10/10/2021   RDW 16.6 (H) 10/10/2021   LYMPHSABS 1.2 07/14/2021   MONOABS 0.3 07/14/2021   EOSABS 0.2 07/14/2021   BASOSABS 0.0 73/22/0254     Last metabolic panel Lab Results  Component Value Date   NA 141 10/09/2021   K 4.9 10/09/2021   CL 105 10/09/2021   CO2 30 10/09/2021   BUN 50 (H) 10/09/2021   CREATININE 3.35 (H) 10/09/2021   GLUCOSE 125 (H) 10/09/2021   GFRNONAA 14 (L) 10/09/2021   GFRAA 40 (L) 10/28/2019   CALCIUM 9.7 10/09/2021   PHOS 3.7 02/16/2021   PROT 7.6 07/14/2021   ALBUMIN 3.9 07/14/2021   LABGLOB 3.4 10/14/2020   AGRATIO 1.2 10/14/2020   BILITOT 0.3 07/14/2021   ALKPHOS 65 07/14/2021   AST 10 (L) 07/14/2021   ALT 7 07/14/2021   ANIONGAP 6 10/09/2021    GFR: Estimated Creatinine Clearance: 25.9 mL/min (A) (by C-G formula based on SCr of 3.35 mg/dL (H)).  Recent Results (from the past 240 hour(s))  Resp Panel by RT-PCR (Flu A&B, Covid) Anterior Nasal Swab     Status: None   Collection Time: 10/09/21  5:22 PM   Specimen: Anterior Nasal Swab  Result Value Ref Range Status  SARS Coronavirus 2 by RT PCR NEGATIVE NEGATIVE Final    Comment: (NOTE) SARS-CoV-2 target nucleic acids are NOT DETECTED.  The SARS-CoV-2 RNA is generally detectable in upper respiratory specimens during the acute phase of infection. The lowest concentration of SARS-CoV-2 viral copies this assay can detect is 138 copies/mL. A negative result does not preclude SARS-Cov-2 infection and should not be used as the sole basis for treatment or other patient management decisions. A negative result may occur with  improper specimen  collection/handling, submission of specimen other than nasopharyngeal swab, presence of viral mutation(s) within the areas targeted by this assay, and inadequate number of viral copies(<138 copies/mL). A negative result must be combined with clinical observations, patient history, and epidemiological information. The expected result is Negative.  Fact Sheet for Patients:  EntrepreneurPulse.com.au  Fact Sheet for Healthcare Providers:  IncredibleEmployment.be  This test is no t yet approved or cleared by the [place] FDA and  has been authorized for detection and/or diagnosis of SARS-CoV-2 by FDA under an Emergency Use Authorization (EUA). This EUA will remain  in effect (meaning this test can be used) for the duration of the COVID-19 declaration under Section 564(b)(1) of the Act, 21 U.S.C.section 360bbb-3(b)(1), unless the authorization is terminated  or revoked sooner.       Influenza A by PCR NEGATIVE NEGATIVE Final   Influenza B by PCR NEGATIVE NEGATIVE Final    Comment: (NOTE) The Xpert Xpress SARS-CoV-2/FLU/RSV plus assay is intended as an aid in the diagnosis of influenza from Nasopharyngeal swab specimens and should not be used as a sole basis for treatment. Nasal washings and aspirates are unacceptable for Xpert Xpress SARS-CoV-2/FLU/RSV testing.  Fact Sheet for Patients: EntrepreneurPulse.com.au  Fact Sheet for Healthcare Providers: IncredibleEmployment.be  This test is not yet approved or cleared by the [place] FDA and has been authorized for detection and/or diagnosis of SARS-CoV-2 by FDA under an Emergency Use Authorization (EUA). This EUA will remain in effect (meaning this test can be used) for the duration of the COVID-19 declaration under Section 564(b)(1) of the Act, 21 U.S.C. section 360bbb-3(b)(1), unless the authorization is terminated or revoked.  Performed at [facility], [address] [place], [place] [address]   MRSA Next Gen by PCR, Nasal     Status: None   Collection Time: 10/10/21 12:52 AM   Specimen: Nasal Mucosa; Nasal Swab  Result Value Ref Range Status   MRSA by PCR Next Gen NOT DETECTED NOT DETECTED Final    Comment: (NOTE) The GeneXpert MRSA Assay (FDA approved for NASAL specimens only), is one component of a comprehensive MRSA colonization surveillance program. It is not intended to diagnose MRSA infection nor to guide or monitor treatment for MRSA infections. Test performance is not FDA approved in patients less than 42 years old. Performed at [facility], [place] [address], [place], [place] [address]       Radiology Studies: US Venous Img Lower Bilateral  Result Date: 10/09/2021 CLINICAL DATA:  Bilateral ankle swelling. EXAM: BILATERAL LOWER EXTREMITY VENOUS DOPPLER ULTRASOUND TECHNIQUE: Gray-scale sonography with graded compression, as well as color Doppler and duplex ultrasound were performed to evaluate the lower extremity deep venous systems from the level of the common femoral vein and including the common femoral, femoral, profunda femoral, popliteal and calf veins including the posterior tibial, peroneal and gastrocnemius veins when visible. The superficial great saphenous vein was also interrogated. Spectral Doppler was utilized to evaluate flow at rest and with distal  augmentation maneuvers in the common femoral, femoral and popliteal veins. COMPARISON:  None Available. FINDINGS: RIGHT LOWER EXTREMITY Common Femoral Vein: No evidence of thrombus. Normal compressibility, respiratory phasicity and response to augmentation. Saphenofemoral Junction: No evidence of thrombus. Normal compressibility and flow on color Doppler imaging. Profunda Femoral Vein: No evidence of thrombus. Normal compressibility and flow on color Doppler imaging. Femoral Vein: No evidence of thrombus. Normal compressibility, respiratory  phasicity and response to augmentation. Popliteal Vein: No evidence of thrombus. Normal compressibility, respiratory phasicity and response to augmentation. Calf Veins: No evidence of thrombus. Normal flow on color Doppler imaging. Compressibility is not well assessed due to patient's body habitus. Superficial Great Saphenous Vein: No evidence of thrombus. Normal compressibility. Venous Reflux:  None. Other Findings: Examination is limited due to patient's body habitus. Mild subcutaneous edema is noted at the ankle. LEFT LOWER EXTREMITY Common Femoral Vein: No evidence of thrombus. Normal compressibility, respiratory phasicity and response to augmentation. Saphenofemoral Junction: No evidence of thrombus. Normal compressibility and flow on color Doppler imaging. Profunda Femoral Vein: No evidence of thrombus. Normal compressibility and flow on color Doppler imaging. Femoral Vein: No evidence of thrombus. Normal compressibility, respiratory phasicity and response to augmentation. Popliteal Vein: No evidence of thrombus. Normal compressibility, respiratory phasicity and response to augmentation. Calf Veins: No evidence of thrombus. Normal flow on color Doppler imaging. Compressibility is not well assessed due to patient's body habitus. Superficial Great Saphenous Vein: No evidence of thrombus. Normal compressibility. Venous Reflux:  None. Other Findings:  Examination limited due to patient's body habitus. IMPRESSION: No definite evidence of deep venous thrombosis in either lower extremity. Electronically Signed   By: Brett Fairy M.D.   On: 10/09/2021 21:16   DG Chest 2 View  Result Date: 10/09/2021 CLINICAL DATA:  Shortness of breath EXAM: CHEST - 2 VIEW COMPARISON:  08/12/2020 FINDINGS: Transverse diameter of heart is increased. Central pulmonary vessels are prominent. There are no signs of alveolar pulmonary edema or focal pulmonary consolidation. Posterior costophrenic angles are clear. There is no  pneumothorax. IMPRESSION: Cardiomegaly. Central pulmonary vessels are prominent suggesting CHF. There is no focal pulmonary consolidation. Electronically Signed   By: Elmer Picker M.D.   On: 10/09/2021 18:00      LOS: 0 days    Cordelia Poche, MD [facility] 10/10/2021, 7:40 AM   If 7PM-7AM, please contact night-coverage [email or web address]

## 2021-10-10 NOTE — Progress Notes (Addendum)
eLink Physician-Brief Progress Note Patient Name: Stacey Page DOB: [birth year] MRN: [id number]   Date of Service  10/10/2021  HPI/Events of Note  57F with aortic stenosis, CHF, COPD, CKD who presented to [facility] ED with SOB, LE swelling. NS 250cc bolus given. Lasix 40 mg given once. Accepted to PCCM for low BP. Arrived on  BiPAP  Camera check: HR 50 SBP 120s  ABG 7.216/78/22 BUN/Cr 50/3.35 (baseline 1.7>3.35) BNP 576 Trop 202>226 LA 0.8  CXR Cardiomegaly. No pulmonary edema, effusion LE dopplers neg  eICU Interventions  Primary team at bedside Diurese Echo ordered Consider cardiology consult in AM per [facility] ED notes BiPAP PRN OK for CLD. Do not suspect any pending procedures     Intervention Category Evaluation Type: New Patient Evaluation  [name] 10/10/2021, 1:08 AM

## 2021-10-10 NOTE — Progress Notes (Signed)
  Echocardiogram 2D Echocardiogram has been performed.  Stacey Page 10/10/2021, 3:16 PM

## 2021-10-10 NOTE — Consult Note (Addendum)
Cardiology Consultation   Patient ID: Stacey Page MRN: [id number]; DOB: 22-Aug-1950  Admit date: 10/09/2021 Date of Consult: 10/10/2021  PCP:  [name], MD   [facility] Cardiologist:  Glenetta Hew, MD        Patient Profile:   Stacey Page is a 71 y.o. female with a hx of Hypertensive Heart Disease with HFpEF,OSA with CPAP (likely OHS), HLD and DM-2 (with diabetic peripheral angiopathy/neuropathy), CKD 3B-4 who is being seen 10/10/2021 for the evaluation of SOB at the request of Dr Chase Caller.  History of Present Illness:   Stacey Page is a 71 y.o. female with a hx of Hypertensive Heart Disease with HFpEF,OSA with CPAP (likely OHS), HLD and DM-2 (with diabetic peripheral angiopathy/neuropathy), CKD 3B-4 who is being seen 10/10/2021 for the evaluation of SOB   Shortness of breath has been worsening over the last couple of days.  She has had some increased leg swelling.  She recently was traveling (came back from [place]) thinks that the leg swelling is resulting from that, missed her lasix couple doses.  No fever.  No productive cough.  No associated chest pain.  Presented to the ED lethargic. While in the ED she was found to be hypoxic with an SPO2 in the 80's and BP of 80/49. BNP 576, troponin 202 > 226. Creatinine 3.35 (baseline 1.87-1.9), BUN 50. VBG 7.21/78/22/31. Lactic acid 0.8. WBC 5.9. VBG 7.21/78/22/31. She was started on BiPAP. 43m of lasix given.   EKG shows TWI in inferior and lateral leads Trop 202/226 BNP 576 CXR: pulmonary edema, cardiomegaly  Cardiac studies: TTE 06/14/2021: EF 60 to 65%.  No RWMA.  Moderate cLVH with GR 1 DD.  Mild LA dilation.  Mild to moderate aortic calcification/stenosis.  Mean AVG 18 mmHg. CPX 06/21/2021: Performed on carvedilol 25 mg twice daily PFTs: FVC 1.76 (60%), FEV1 1.59 (77%), ratio 114%.  MVV 69 (76%). Exercise time 5 minutes at 1 mph 2% grade.  Stopped for 7 out of 10 dyspnea.  Noted difficulty  ambulating getting back and forth to the equipment.  Pulse Ox 95-98%, low 90% -> Recovery 97% Heart rate increased from 64 up to 90 bpm (only 60% max predicted HR--intentionally performed on stable dose of carvedilol 25 mg twice daily) BP increased from 106/68-160/64 mmHg. Peak VO2 8.7 which is 70% predicted Preliminary Conclusion: Normal functional capacity compared to max sedentary norms.  No clear-cut pulm limitation or preexercise spirometry and I recommend large-size radial body habitus as a major contributor to exercise intolerance.  Evidence of chronotropic incompetence also noted. Attending attestation: Resting spirometry shows restrictive physiology.  Unadjusted peak VO2 quite low suggesting body habitus is the main limiting factor in her exercise limitation.  This is in conjunction with significant chronotropic competence. Past Medical History:  Diagnosis Date   Anxiety    doesn't take any meds   Arthritis of right knee 03/14/2016   Asthma    Back pain    Chronic diastolic CHF (congestive heart failure) (HCC)    HF with Preserved EF (60-65%) - Grade II Diastolic Dysfunction (Hypertensive Heart Disease). takes Furosemide daily   Depression    doesn't take meds   Diabetes ([place]    takes Januvia daily   Eczema    uses cream as needed   GERD (gastroesophageal reflux disease)    History of bronchitis as a child    HTN (hypertension)    Insomnia    Mild aortic stenosis by prior echocardiogram 04/2017  Mild stenosis: Mean gradient 15 mmHg, peak gradient 28 mmHg   Mycobacterium avium-intracellulare infection ([place]) 2016   OA (osteoarthritis)    Obese    OSA (obstructive sleep apnea) 02/25/2014   wears CPAP at night   Peripheral neuropathy    takes Gabapentin as needed   Pneumonia    hx of-2010   Seasonal allergies    uses Flonase daily   Sleep apnea     Past Surgical History:  Procedure Laterality Date   BREAST BIOPSY Right 03/2018   BREAST LUMPECTOMY WITH RADIOACTIVE  SEED LOCALIZATION Right 12/28/2018   Procedure: RIGHT BREAST LUMPECTOMY WITH RADIOACTIVE SEED LOCALIZATION;  Surgeon: Jovita Kussmaul, MD;  Location: [facility];  Service: General;  Laterality: Right;   BREAST SURGERY     CARDIOPULMONARY EXERCISE TEST (CPX)  06/21/2021   (Performed on Coreg 25 mg twice daily): PFTs: FVC 1.76 (60%), FEV1 1.59 (77%), ratio 114% => RESTRICTIVE PHYSIOLOGY; 5 min- 1 mph - 2% grade. 7/10 dyspnea -> pulse ox range during exercise 95 to 98%, low of 90%, 97% on recovery; BP 106/84-160/64; heart rate 64-90 bpm (60% MPHR) -> Chronotropic Incompetence;;MAIN FACTOR for Exercise Limitation = BODY HABITUS w/ Chronotropic Incompetence.   CESAREAN SECTION  x2   COLONOSCOPY     CORONARY CALCIUM SCORE & CT ANGIOGRAM  09/2017   Coronary Ca Score = 11 (low).  CTA- no obstructive CAD (minimal disease)   JOINT REPLACEMENT     KNEE ARTHROSCOPY Right    LUNG BIOPSY Right 06/10/2014   Procedure: LUNG BIOPSY;  Surgeon: Ivin Poot, MD;  Location: [facility];  Service: Thoracic;  Laterality: Right;   POLYPECTOMY     throat   TOTAL KNEE ARTHROPLASTY Right 03/14/2016   Procedure: RIGHT TOTAL KNEE ARTHROPLASTY;  Surgeon: Marybelle Killings, MD;  Location: [facility];  Service: Orthopedics;  Laterality: Right;   TRANSTHORACIC ECHOCARDIOGRAM  11/04/2019   EF 60-65%.  Moderate LVH.  GRII DD.  Mild LV dilation.  Mild LA dilation.  Thickened-calcified aortic valve-Mild AS (mean gradient 13 mmHg, peak 29 mmHg--similar to 2019)   TRANSTHORACIC ECHOCARDIOGRAM  06/14/2021   EF 60 to 65%.  No RWMA.  Moderate cLVH with GR 1 DD.  Mild LA dilation.  Mild to moderate aortic calcification/stenosis.  Mean AVG 18 mmHg.   VIDEO ASSISTED THORACOSCOPY Right 06/10/2014   Procedure: VIDEO ASSISTED THORACOSCOPY;  Surgeon: Ivin Poot, MD;  Location: [facility] OR;  Service: Thoracic;  Laterality: Right;     Home Medications:  Prior to Admission medications   Medication Sig Start Date End Date Taking? Authorizing Provider   albuterol (PROAIR HFA) 108 (90 Base) MCG/ACT inhaler Inhale 1-2 puffs into the lungs every 6 (six) hours as needed for wheezing or shortness of breath. 06/04/21   [name], [name], NP  aspirin 81 MG tablet Take 1 tablet (81 mg total) by mouth daily with breakfast. 07/15/16   [name], [name], NP  atorvastatin (LIPITOR) 20 MG tablet TAKE 1 TABLET BY MOUTH  DAILY AT 6 PM. 02/16/21   [name], [name], NP  Blood Glucose Monitoring Suppl ([place]) w/Device KIT 1 kit by Does not apply route daily as needed. 07/22/21   [name], MD  carvedilol (COREG) 25 MG tablet Take 1 tablet (25 mg total) by mouth 2 (two) times daily. 02/16/21   Leonie Man, MD  cholecalciferol (VITAMIN D3) 25 MCG (1000 UNIT) tablet Take 2,000 Units by mouth daily.    [provider]  Continuous Blood Gluc Sensor (FREESTYLE LIBRE 14 DAY SENSOR) MISC Place one sensor on the skin every 14 days to monitor blood sugars continuously as directed. 08/26/21   [name], MD  Docusate Sodium 100 MG capsule Take 100 mg by mouth 2 (two) times daily.    [provider]  ferrous sulfate 325 (65 FE) MG tablet Take 325 mg by mouth 2 (two) times daily with a meal.    [provider]  fluticasone (FLONASE) 50 MCG/ACT nasal spray instill ONE SPRAY in each nostril daily 09/27/21   [name], [name], NP  furosemide (LASIX) 40 MG tablet TAKE 1 TAB FOUR TIMES PER WEEK, TAKE AN ADDITIONAL TAB FOR WEIGHT GAIN OF 2 LBS OVERNIGHT OR 5 LBS IN ONE WEEK 02/16/21   Leonie Man, MD  gabapentin (NEURONTIN) 100 MG capsule Take 1 capsule (100 mg total) by mouth 2 (two) times daily. 06/22/21   [name], [name], NP  glucose blood (ONETOUCH VERIO) test strip Use to monitor blood sugar twice daily as instructed 09/20/21   [name], MD  hydrALAZINE (APRESOLINE) 50 MG tablet Take 2 tablets (100 mg total) by mouth 3 (three) times daily. 08/06/21   Leonie Man, MD  isosorbide  dinitrate (ISORDIL) 20 MG tablet Take 2 tablet ( 40 mg total ) by mouth in morning and evening with Hydralazine 100 mg  dose 08/06/21   Leonie Man, MD  JANUVIA 25 MG tablet TAKE ONE TABLET BY MOUTH ONCE DAILY 05/19/21   [name], [name], NP  lisinopril (ZESTRIL) 20 MG tablet TAKE 1 TABLET BY MOUTH ONCE DAILY WITH BREAKFAST 02/16/21   [name], [name], NP  montelukast (SINGULAIR) 10 MG tablet Take 1 tablet (10 mg total) by mouth at bedtime. 02/16/21   [name], [name], NP  OneTouch Delica Lancets 21H MISC Use to monitor blood sugars once daily as directed 08/05/21   [name], MD    Inpatient Medications: Scheduled Meds:  aspirin  81 mg Oral Daily   atorvastatin  20 mg Oral Daily   Chlorhexidine Gluconate Cloth  6 each Topical Daily   heparin injection (subcutaneous)  5,000 Units Subcutaneous Q8H   insulin aspart  0-15 Units Subcutaneous Q4H   mouth rinse  15 mL Mouth Rinse 4 times per day   pantoprazole  40 mg Oral Daily   Continuous Infusions:  PRN Meds: ipratropium-albuterol, mouth rinse  Allergies:   No Known Allergies  Social History:   Social History   Socioeconomic History   Marital status: Widowed    Spouse name: Not on file   Number of children: Not on file   Years of education: Not on file   Highest education level: Not on file  Occupational History   Occupation: retired  Tobacco Use   Smoking status: Former    Packs/day: 0.25    Years: 15.00    Total pack years: 3.75    Types: Cigarettes    Quit date: 02/08/1980    Years since quitting: 41.6   Smokeless tobacco: Former  Scientific laboratory technician Use: Never used  Substance and Sexual Activity   Alcohol use: Yes    Alcohol/week: 0.0 standard drinks of alcohol    Comment: occassional/social/rare   Drug use: Not Currently   Sexual activity: Not Currently  Other Topics Concern   Not on file  Social History Narrative   Not on file   Social Determinants of Health   Financial Resource  Strain: Low  Risk  (08/17/2021)   Overall Financial Resource Strain (CARDIA)    Difficulty of Paying Living Expenses: Not hard at all  Food Insecurity: No Food Insecurity (08/17/2021)   Hunger Vital Sign    Worried About Running Out of Food in the Last Year: Never true    Ran Out of Food in the Last Year: Never true  Transportation Needs: No Transportation Needs (08/17/2021)   PRAPARE - Hydrologist (Medical): No    Lack of Transportation (Non-Medical): No  Physical Activity: Insufficiently Active (08/17/2021)   Exercise Vital Sign    Days of Exercise per Week: 3 days    Minutes of Exercise per Session: 30 min  Stress: No Stress Concern Present (08/17/2021)   [place]    Feeling of Stress : Not at all  Social Connections: Socially Isolated (08/17/2021)   Social Connection and Isolation Panel [NHANES]    Frequency of Communication with Friends and Family: Three times a week    Frequency of Social Gatherings with Friends and Family: Three times a week    Attends Religious Services: Never    Active Member of Clubs or Organizations: No    Attends Archivist Meetings: Never    Marital Status: Widowed  Intimate Partner Violence: Not At Risk (08/17/2021)   Humiliation, Afraid, Rape, and Kick questionnaire    Fear of Current or Ex-Partner: No    Emotionally Abused: No    Physically Abused: No    Sexually Abused: No    Family History:    Family History  Problem Relation Age of Onset   COPD Father    Hypothyroidism Father    Anemia Father        iron deficiency   High blood pressure Father    Alcoholism Father    Cancer Mother 11       pancreatic   High blood pressure Mother    High Cholesterol Mother    Breast cancer Neg Hx      ROS:  Please see the history of present illness.   All other ROS reviewed and negative.     Physical Exam/Data:   Vitals:   10/10/21 0000  10/10/21 0100 10/10/21 0104 10/10/21 0200  BP: (!) 111/48 (!) 111/52  (!) 123/43  Pulse: (!) 116 (!) 54  (!) 49  Resp: (!) '24 14  19  ' Temp:   97.9 F (36.6 C)   TempSrc:   Oral   SpO2: (!) 41% 93%  98%  Weight:  (!) 163.5 kg    Height:  '5\' 8"'  (1.727 m)      Intake/Output Summary (Last 24 hours) at 10/10/2021 0326 Last data filed at 10/09/2021 2340 Gross per 24 hour  Intake 250 ml  Output --  Net 250 ml      10/10/2021    1:00 AM 09/21/2021    8:00 AM 08/23/2021    7:00 AM  Last 3 Weights  Weight (lbs) 360 lb 7.2 oz 355 lb 347 lb  Weight (kg) 163.5 kg 161.027 kg 157.398 kg     Body mass index is 54.81 kg/m.  General:  Well nourished, well developed, in no acute distress HEENT: normal Neck:  JVD++ Vascular: No carotid bruits; Distal pulses 2+ bilaterally Cardiac:  normal S1, S2; RRR; ejection systolic murmur+ Lungs:  crackles+ Abd: soft, nontender, no hepatomegaly  Ext: 2+edema Musculoskeletal:  No deformities, BUE and BLE strength normal and equal Skin:  warm and dry  Neuro:  CNs 2-12 intact, no focal abnormalities noted Psych:  Normal affect    Laboratory Data:  High Sensitivity Troponin:   Recent Labs  Lab 10/09/21 1722 10/09/21 1956  TROPONINIHS 202* 226*     Chemistry Recent Labs  Lab 10/09/21 1722 10/09/21 2001  NA 141 141  K 4.8 4.9  CL 105  --   CO2 30  --   GLUCOSE 125*  --   BUN 50*  --   CREATININE 3.35*  --   CALCIUM 9.7  --   GFRNONAA 14*  --   ANIONGAP 6  --     No results for input(s): "PROT", "ALBUMIN", "AST", "ALT", "ALKPHOS", "BILITOT" in the last 168 hours. Lipids No results for input(s): "CHOL", "TRIG", "HDL", "LABVLDL", "LDLCALC", "CHOLHDL" in the last 168 hours.  Hematology Recent Labs  Lab 10/09/21 1722 10/09/21 2001  WBC 5.9  --   RBC 4.00  --   HGB 11.8* 12.6  HCT 39.9 37.0  MCV 99.8  --   MCH 29.5  --   MCHC 29.6*  --   RDW 16.6*  --   PLT 160  --    Thyroid No results for input(s): "TSH", "FREET4" in the last 168  hours.  BNP Recent Labs  Lab 10/09/21 1722  BNP 576.7*    DDimer No results for input(s): "DDIMER" in the last 168 hours.   Radiology/Studies:  US Venous Img Lower Bilateral  Result Date: 10/09/2021 CLINICAL DATA:  Bilateral ankle swelling. EXAM: BILATERAL LOWER EXTREMITY VENOUS DOPPLER ULTRASOUND TECHNIQUE: Gray-scale sonography with graded compression, as well as color Doppler and duplex ultrasound were performed to evaluate the lower extremity deep venous systems from the level of the common femoral vein and including the common femoral, femoral, profunda femoral, popliteal and calf veins including the posterior tibial, peroneal and gastrocnemius veins when visible. The superficial great saphenous vein was also interrogated. Spectral Doppler was utilized to evaluate flow at rest and with distal augmentation maneuvers in the common femoral, femoral and popliteal veins. COMPARISON:  None Available. FINDINGS: RIGHT LOWER EXTREMITY Common Femoral Vein: No evidence of thrombus. Normal compressibility, respiratory phasicity and response to augmentation. Saphenofemoral Junction: No evidence of thrombus. Normal compressibility and flow on color Doppler imaging. Profunda Femoral Vein: No evidence of thrombus. Normal compressibility and flow on color Doppler imaging. Femoral Vein: No evidence of thrombus. Normal compressibility, respiratory phasicity and response to augmentation. Popliteal Vein: No evidence of thrombus. Normal compressibility, respiratory phasicity and response to augmentation. Calf Veins: No evidence of thrombus. Normal flow on color Doppler imaging. Compressibility is not well assessed due to patient's body habitus. Superficial Great Saphenous Vein: No evidence of thrombus. Normal compressibility. Venous Reflux:  None. Other Findings: Examination is limited due to patient's body habitus. Mild subcutaneous edema is noted at the ankle. LEFT LOWER EXTREMITY Common Femoral Vein: No evidence of  thrombus. Normal compressibility, respiratory phasicity and response to augmentation. Saphenofemoral Junction: No evidence of thrombus. Normal compressibility and flow on color Doppler imaging. Profunda Femoral Vein: No evidence of thrombus. Normal compressibility and flow on color Doppler imaging. Femoral Vein: No evidence of thrombus. Normal compressibility, respiratory phasicity and response to augmentation. Popliteal Vein: No evidence of thrombus. Normal compressibility, respiratory phasicity and response to augmentation. Calf Veins: No evidence of thrombus. Normal flow on color Doppler imaging. Compressibility is not well assessed due to patient's body habitus. Superficial Great Saphenous Vein: No evidence of thrombus. Normal compressibility. Venous Reflux:  None. Other  Findings:  Examination limited due to patient's body habitus. IMPRESSION: No definite evidence of deep venous thrombosis in either lower extremity. Electronically Signed   By: Brett Fairy M.D.   On: 10/09/2021 21:16   DG Chest 2 View  Result Date: 10/09/2021 CLINICAL DATA:  Shortness of breath EXAM: CHEST - 2 VIEW COMPARISON:  08/12/2020 FINDINGS: Transverse diameter of heart is increased. Central pulmonary vessels are prominent. There are no signs of alveolar pulmonary edema or focal pulmonary consolidation. Posterior costophrenic angles are clear. There is no pneumothorax. IMPRESSION: Cardiomegaly. Central pulmonary vessels are prominent suggesting CHF. There is no focal pulmonary consolidation. Electronically Signed   By: Elmer Picker M.D.   On: 10/09/2021 18:00     Assessment and Plan:   Ac on chronic CHF exacerbation, HFpEF, 55-60% Ac hypoxic and hypercarbic respiratory failure- on BIPAP-> weaned to oxygen Ac pulmonary edema AKI on CKD (baseline Cr is 1.8), stage 3b Morbid obesity Ac metabolic encephalopathy Flat trops from demand ischemia (202-226)  Plan: - admitted to critical care (ICU) given hypercarbic  respiratory failure. BIPAP-> weaned to oxygen  - s/p IV Lasix 45m, would recommend IV lasix 885mBID (markedly volume overload (3+ edema, difficult JVD, CXR pulm edema after missing lasix), stricti I/Os, daily weights - respiratory failure-> currently Aox3, on 3lts oxygen mx per ICU team - obtain echo, trend Cr - other comorbidities mx per primary team   Risk Assessment/Risk Scores:        New York Heart Association (NYHA) Functional Class NYHA Class III        For questions or updates, please contact [facility] consult [email or web address] for contact info under    Signed, RoRenae FickleMD  10/10/2021 3:26 AM  Personally seen and examined. Agree with above.  [age]ear old with diastolic heart failure ejection fraction 60% with hypoxic and hypercarbic respiratory failure as well with pulmonary edema acute kidney injury morbid obesity and mildly elevated troponin which is flat in the low 200 range compatible with myocardial injury in the setting of respiratory failure.  Overall worsening shortness of breath over the last several days.  Lethargic.  Blood pressure was quite soft on arrival, however may have been falsely low.  Agree with Lasix 80 mg IV twice daily given marked fluid overload.  Exam demonstrating 3+ edema, increased respiratory effort  Awaiting echocardiogram.  MaCandee FurbishMD

## 2021-10-10 NOTE — Progress Notes (Signed)
PCCM Interval Note   71 year old female admitted overnight with acute hypoxic respiratory failure thought secondary acute on chronic HFpEF with AKI.  Cardiology consulted and following with ongoing diureses.     Patient reports she's voided twice> 900 plus unmeasured.  No complaints other than she does not like BiPAP and has asked her family to bring in her home CPAP.    BP 128/42, HR SB 50's, 94 on 3L  Alert, oriented.  No distress. Lungs clear and diminished.  RR HR.  +2 LE pitting edema  Labs reviewed> stable sCr, K 4.8, trop trend flat in 200's, PCT neg, Hgb 11.1, plt 144  P:  Echo still pending Currently in PCU> TRH to resume care in am Ongoing diureses as ordered Continue close monitoring Appreciate cardiology input       Kennieth Rad, MSN, AG-ACNP-BC [employer] 10/10/2021, 3:34 PM  See Amion for pager If no response to pager, please call PCCM consult pager After 7:00 pm call [facility]

## 2021-10-10 NOTE — Progress Notes (Signed)
Home BIPAP set up at bedside, home settings, added sterile H2O  per pt request. Added 3L O2 bleed in as patient was currently on 3L [place]. RT will monitor as needed.

## 2021-10-10 NOTE — Hospital Course (Signed)
Stacey Page is a 71 y.o. female with a history of heart failure, aortic stenosis, hypertension, hyperlipidemia, OSA, obesity, COPD, CKD stage IV and diabetes mellitus type 2. Patient presented secondary to shortness of breath and was found to have evidence of acute respiratory failure with hypoxia/hypercarbia and mental status changes. She was also noted to have evidence of acute heart failure. Patient managed initially on BiPAP with improvement of mental status. Lasix IV initiated for management of her acute heart failure. Cardiology consulted for management of heart failure.

## 2021-10-11 DIAGNOSIS — I2489 Other forms of acute ischemic heart disease: Secondary | ICD-10-CM

## 2021-10-11 DIAGNOSIS — E1169 Type 2 diabetes mellitus with other specified complication: Secondary | ICD-10-CM

## 2021-10-11 DIAGNOSIS — I248 Other forms of acute ischemic heart disease: Secondary | ICD-10-CM

## 2021-10-11 DIAGNOSIS — I5031 Acute diastolic (congestive) heart failure: Secondary | ICD-10-CM

## 2021-10-11 DIAGNOSIS — I110 Hypertensive heart disease with heart failure: Secondary | ICD-10-CM

## 2021-10-11 DIAGNOSIS — E8729 Other acidosis: Secondary | ICD-10-CM

## 2021-10-11 DIAGNOSIS — N182 Chronic kidney disease, stage 2 (mild): Secondary | ICD-10-CM

## 2021-10-11 DIAGNOSIS — J849 Interstitial pulmonary disease, unspecified: Secondary | ICD-10-CM

## 2021-10-11 DIAGNOSIS — N179 Acute kidney failure, unspecified: Secondary | ICD-10-CM | POA: Diagnosis not present

## 2021-10-11 DIAGNOSIS — G4733 Obstructive sleep apnea (adult) (pediatric): Secondary | ICD-10-CM

## 2021-10-11 DIAGNOSIS — I5033 Acute on chronic diastolic (congestive) heart failure: Secondary | ICD-10-CM | POA: Diagnosis not present

## 2021-10-11 DIAGNOSIS — E785 Hyperlipidemia, unspecified: Secondary | ICD-10-CM

## 2021-10-11 DIAGNOSIS — I350 Nonrheumatic aortic (valve) stenosis: Secondary | ICD-10-CM

## 2021-10-11 DIAGNOSIS — I5032 Chronic diastolic (congestive) heart failure: Secondary | ICD-10-CM

## 2021-10-11 DIAGNOSIS — Z6841 Body Mass Index (BMI) 40.0 and over, adult: Secondary | ICD-10-CM

## 2021-10-11 DIAGNOSIS — J9602 Acute respiratory failure with hypercapnia: Secondary | ICD-10-CM | POA: Diagnosis not present

## 2021-10-11 LAB — BASIC METABOLIC PANEL
Anion gap: 7 (ref 5–15)
BUN: 50 mg/dL — ABNORMAL HIGH (ref 8–23)
CO2: 29 mmol/L (ref 22–32)
Calcium: 8.8 mg/dL — ABNORMAL LOW (ref 8.9–10.3)
Chloride: 107 mmol/L (ref 98–111)
Creatinine, Ser: 3.31 mg/dL — ABNORMAL HIGH (ref 0.44–1.00)
GFR, Estimated: 15 mL/min — ABNORMAL LOW (ref >60)
Glucose, Bld: 95 mg/dL (ref 70–99)
Potassium: 4.6 mmol/L (ref 3.5–5.1)
Sodium: 143 mmol/L (ref 135–145)

## 2021-10-11 LAB — GLUCOSE, CAPILLARY
Glucose-Capillary: 101 mg/dL — ABNORMAL HIGH (ref 70–99)
Glucose-Capillary: 112 mg/dL — ABNORMAL HIGH (ref 70–99)
Glucose-Capillary: 123 mg/dL — ABNORMAL HIGH (ref 70–99)
Glucose-Capillary: 131 mg/dL — ABNORMAL HIGH (ref 70–99)
Glucose-Capillary: 140 mg/dL — ABNORMAL HIGH (ref 70–99)
Glucose-Capillary: 95 mg/dL (ref 70–99)

## 2021-10-11 LAB — CBC
HCT: 38.2 % (ref 36.0–46.0)
Hemoglobin: 11.3 g/dL — ABNORMAL LOW (ref 12.0–15.0)
MCH: 29.8 pg (ref 26.0–34.0)
MCHC: 29.6 g/dL — ABNORMAL LOW (ref 30.0–36.0)
MCV: 100.8 fL — ABNORMAL HIGH (ref 80.0–100.0)
Platelets: 143 10*3/uL — ABNORMAL LOW (ref 150–400)
RBC: 3.79 MIL/uL — ABNORMAL LOW (ref 3.87–5.11)
RDW: 16.6 % — ABNORMAL HIGH (ref 11.5–15.5)
WBC: 5.2 10*3/uL (ref 4.0–10.5)
nRBC: 0 % (ref 0.0–0.2)

## 2021-10-11 LAB — PROCALCITONIN: Procalcitonin: <0.1 ng/mL

## 2021-10-11 LAB — BRAIN NATRIURETIC PEPTIDE: B Natriuretic Peptide: 478.7 pg/mL — ABNORMAL HIGH (ref 0.0–100.0)

## 2021-10-11 MED ORDER — CARVEDILOL 6.25 MG PO TABS
6.2500 mg | ORAL_TABLET | Freq: Two times a day (BID) | ORAL | Status: DC
  Administered 2021-10-11 – 2021-10-16 (×10): 6.25 mg via ORAL
  Filled 2021-10-11 (×10): qty 1

## 2021-10-11 MED ORDER — ISOSORBIDE DINITRATE 10 MG PO TABS
10.0000 mg | ORAL_TABLET | Freq: Three times a day (TID) | ORAL | Status: DC
  Administered 2021-10-11 – 2021-10-16 (×15): 10 mg via ORAL
  Filled 2021-10-11 (×16): qty 1

## 2021-10-11 MED ORDER — HYDRALAZINE HCL 25 MG PO TABS
25.0000 mg | ORAL_TABLET | Freq: Three times a day (TID) | ORAL | Status: DC
  Administered 2021-10-11 – 2021-10-12 (×3): 25 mg via ORAL
  Filled 2021-10-11 (×3): qty 1

## 2021-10-11 MED ORDER — INSULIN ASPART 100 UNIT/ML IJ SOLN
0.0000 [IU] | Freq: Three times a day (TID) | INTRAMUSCULAR | Status: DC
  Administered 2021-10-11 – 2021-10-12 (×2): 2 [IU] via SUBCUTANEOUS
  Administered 2021-10-13: 3 [IU] via SUBCUTANEOUS
  Administered 2021-10-13 – 2021-10-15 (×3): 2 [IU] via SUBCUTANEOUS
  Administered 2021-10-15 – 2021-10-16 (×2): 3 [IU] via SUBCUTANEOUS

## 2021-10-11 NOTE — Progress Notes (Signed)
PT wearing home cpap. PT found with 2 L bleed in with sats 87%. Titrated to 5L to obtain sat of 92%. No resp distress noted.

## 2021-10-11 NOTE — Progress Notes (Signed)
Pt returned to bed from going to bathroom, and refused to have her VS taken.

## 2021-10-11 NOTE — Progress Notes (Signed)
Pt refused Heparin. MD notified and red med refusal documented.

## 2021-10-11 NOTE — Progress Notes (Signed)
Rounding Note    Patient Name: Stacey Page Date of Encounter: 10/11/2021  [facility] Cardiologist: Glenetta Hew, MD   Patient Profile     71 y.o. female with a history of heart failure, aortic stenosis, hypertension, hyperlipidemia, OSA, obesity, COPD, CKD stage IV and diabetes mellitus type 2. Patient presented secondary to shortness of breath and was found to have evidence of acute respiratory failure with hypoxia/hypercarbia and mental status changes. She was also noted to have evidence of acute heart failure. Patient managed initially on BiPAP with improvement of mental status. Lasix IV initiated for management of her acute heart failure. Cardiology consulted for management of heart failure.   Assessment & Plan    Principal Problem:   Acute respiratory failure with hypercapnia (HCC) Active Problems:   Hypertensive heart disease with chronic diastolic congestive heart failure (HCC)   Acute on chronic heart failure with preserved ejection fraction (HCC)   OSA (obstructive sleep apnea)   Class 3 severe obesity with serious comorbidity and body mass index (BMI) of 50.0 to 59.9 in adult ([place])   ILD (interstitial lung disease) 2/2 MAI s/p med rxn   Acute renal failure superimposed on stage 2 chronic kidney disease ([place])   Hyperlipidemia associated with type 2 diabetes mellitus (HCC)   Moderate aortic stenosis by prior echocardiogram  Principal Problem:   Acute respiratory failure with hypercapnia (HCC) Initially treated with BiPAP now switched to his home CPAP. => Remains on oxygen via [place] with plans to remain to room air. Remains dyspneic, but improved.  Active Problems:   Hypertensive heart disease with chronic diastolic congestive heart failure ([place]) /  Acute on chronic heart failure with preserved ejection fraction (HCC) -> hyperdynamic LV function by echo.Very difficult to assess volume status. Started on IV Lasix 80 mg twice daily reasonable to continue least  1 more day until we see improvement in renal function and volume status.  Reassess in the morning. => Would hope to potentially convert to oral Lasix by tomorrow or the next day depending on renal function and volume status.   Profoundly hypertensive in the outpatient setting however was hypotensive upon admission.  Medications were held and now blood pressures are drifting back up again. We will restart carvedilol, but lower dose because of prior reported => restart at 6.25 mg twice daily Reinitiate hydralazine/Isordil at 25-10 mg 3 times daily      Class 3 severe obesity with serious comorbidity and body mass index (BMI) of 50.0 to 59.9 in adult (HCC) / OSA (obstructive sleep apnea) /   ILD (interstitial lung disease) 2/2 MAI s/p med rxn Converted from BiPAP to CPAP.  Will need to continue home CPAP.    Acute renal failure superimposed on stage 2 chronic kidney disease (HCC) No appreciable improvement in renal function today, making good urine.  Would hopefully improve as volume status improves. Hold ACE inhibitor As BP tolerates can reinitiate hydralazine/nitrate for afterload reduction If creatinine does not start to improve, would reengage nephrology to ensure that she has close follow-up and management.    Hyperlipidemia associated with type 2 diabetes mellitus ([place]) Was on rosuvastatin at home, can restart 20 mg Diabetes management per primary team however would likely benefit from GLP-1 agonist and/or SGLT2 inhibitor on discharge    Moderate aortic stenosis by prior echocardiogram Mild progression of aortic valve gradient from previous echo, but could be partially related to ongoing illness.  At this level was still not expected to be the reason  for heart exacerbation.  Would simply worsen hypertensive heart disease.   ---------------------------  Subjective   Feels better today.  Still little bit tired.  Breathing improved.  Inpatient Medications    Scheduled Meds:  aspirin   81 mg Oral Daily   atorvastatin  20 mg Oral Daily   Chlorhexidine Gluconate Cloth  6 each Topical Daily   furosemide  80 mg Intravenous BID   heparin injection (subcutaneous)  5,000 Units Subcutaneous Q8H   insulin aspart  0-15 Units Subcutaneous TID WC   mouth rinse  15 mL Mouth Rinse 4 times per day   pantoprazole  40 mg Oral Daily   Continuous Infusions:  PRN Meds: ipratropium-albuterol, mouth rinse   Vital Signs    Vitals:   10/11/21 0318 10/11/21 0319 10/11/21 0554 10/11/21 0741  BP:   (!) 129/55 (!) 155/50  Pulse: 75 72  76  Resp: _0 Temp:   98.1 F (36.7 C) 97.7 F (36.5 C)  TempSrc:   Oral Oral  SpO2:  (!) 89%  90%  Weight:      Height:        Intake/Output Summary (Last 24 hours) at 10/11/2021 1018 Last data filed at 10/11/2021 0743 Gross per 24 hour  Intake 300 ml  Output 350 ml  Net -50 ml      10/10/2021    1:00 AM 09/21/2021    8:00 AM 08/23/2021    7:00 AM  Last 3 Weights  Weight (lbs) 360 lb 7.2 oz 355 lb 347 lb  Weight (kg) 163.5 kg 161.027 kg 157.398 kg      Telemetry    Sinus rhythm- Personally Reviewed  ECG    No new study - Personally Reviewed  Physical Exam   GEN: Morbidly obese.  Resting in bed no acute distress.   Neck: Trace to 1+ JVD-very difficult to assess due to body habitus. Cardiac: Distant heart sounds.  RRR, 2/6 SEM at RUSB-carotid; no rubs, or gallops.  Respiratory: Difficult to assess due to body habitus, but appears to still have some mild basal crackles but otherwise CTA B, nonlabored. GI: Soft, nontender, non-distended  MS: No edema; No deformity. Neuro:  Nonfocal  Psych: Normal affect   Cardiac Studies-summarized   TTE 10/10/2021: Hyperdynamic LV function.  EF 70 to 75%.  Moderate LVH with indeterminate diastolic parameters but moderate LA would suggest GRII DD.  Moderately elevated PAP with severely elevated RAP.Marland Kitchen  Moderate MAC.  AOV calcification with moderate AS (mean AVG 23 mmHg)  Labs    High  Sensitivity Troponin: Flat trend.  Likely demand ischemia also related to acute on chronic renal failure Recent Labs  Lab 10/09/21 1722 10/09/21 1956 10/10/21 0533  TROPONINIHS 202* 226* 224*     Chemistry Recent Labs  Lab 10/09/21 1722 10/09/21 2001 10/10/21 0533 10/10/21 0744 10/11/21 0009  NA 141 141  --  142 143  K 4.8 4.9  --  4.8 4.6  CL 105  --   --  108 107  CO2 30  --   --  29 29  GLUCOSE 125*  --   --  128* 95  BUN 50*  --   --  48* 50*  CREATININE 3.35*  --   --  3.24* 3.31*  CALCIUM 9.7  --   --  8.8* 8.8*  MG  --   --  2.5*  --   --   GFRNONAA 14*  --   --  15*  15*  ANIONGAP 6  --   --  5 7    Lipids No results for input(s): "CHOL", "TRIG", "HDL", "LABVLDL", "LDLCALC", "CHOLHDL" in the last 168 hours.  Hematology Recent Labs  Lab 10/09/21 1722 10/09/21 2001 10/10/21 0533 10/11/21 0009  WBC 5.9  --  5.8 5.2  RBC 4.00  --  3.79* 3.79*  HGB 11.8* 12.6 11.1* 11.3*  HCT 39.9 37.0 37.7 38.2  MCV 99.8  --  99.5 100.8*  MCH 29.5  --  29.3 29.8  MCHC 29.6*  --  29.4* 29.6*  RDW 16.6*  --  16.6* 16.6*  PLT 160  --  144* 143*   Thyroid No results for input(s): "TSH", "FREET4" in the last 168 hours.  BNP Recent Labs  Lab 10/09/21 1722 10/11/21 0009  BNP 576.7* 478.7*    DDimer No results for input(s): "DDIMER" in the last 168 hours.   Radiology    ECHOCARDIOGRAM COMPLETE  Result Date: 10/10/2021    ECHOCARDIOGRAM REPORT   Patient Name:   SKIE VITRANO Date of Exam: 10/10/2021 Medical Rec #:  [id number]        Height:       68.0 in Accession #:    [id number]       Weight:       360.4 lb Date of Birth:  August 15, 1950       BSA:          2.625 m Patient Age:    34 years         BP:           90/51 mmHg Patient Gender: F                HR:           51 bpm. Exam Location:  Inpatient Procedure: 2D Echo, Cardiac Doppler and Color Doppler Indications:    Shock  History:        Patient has prior history of Echocardiogram examinations, most                 recent  06/14/2021. CHF; Risk Factors:Sleep Apnea, Hypertension,                 Dyslipidemia, Diabetes and Former Smoker. CKD.  Sonographer:    Alvino Chapel RCS Referring Phys: [id number] [place]  1. Left ventricular ejection fraction, by estimation, is 70 to 75%. The left ventricle has hyperdynamic function. The left ventricle has no regional wall motion abnormalities. There is moderate left ventricular hypertrophy. Left ventricular diastolic parameters are indeterminate.  2. Right ventricular systolic function is normal. The right ventricular size is normal. There is moderately elevated pulmonary artery systolic pressure.  3. Left atrial size was moderately dilated.  4. The mitral valve is normal in structure. No evidence of mitral valve regurgitation. No evidence of mitral stenosis. Moderate mitral annular calcification.  5. The aortic valve is calcified. There is mild calcification of the aortic valve. There is mild thickening of the aortic valve. Aortic valve regurgitation is not visualized. Moderate aortic valve stenosis. Aortic valve area, by VTI measures 1.88 cm. Aortic valve mean gradient measures 23.0 mmHg. Aortic valve Vmax measures 3.12 m/s.  6. The inferior vena cava is dilated in size with <50% respiratory variability, suggesting right atrial pressure of 15 mmHg. Comparison(s): Prior aortic valve gradient 18 mmHg. FINDINGS  Left Ventricle: Left ventricular ejection fraction, by estimation, is 70 to 75%. The left ventricle has hyperdynamic function. The left  ventricle has no regional wall motion abnormalities. The left ventricular internal cavity size was normal in size. There is moderate left ventricular hypertrophy. Left ventricular diastolic parameters are indeterminate. Right Ventricle: The right ventricular size is normal. No increase in right ventricular wall thickness. Right ventricular systolic function is normal. There is moderately elevated pulmonary artery systolic pressure. The  tricuspid regurgitant velocity is 2.93 m/s, and with an assumed right atrial pressure of 15 mmHg, the estimated right ventricular systolic pressure is 32.9 mmHg. Left Atrium: Left atrial size was moderately dilated. Right Atrium: Right atrial size was normal in size. Pericardium: There is no evidence of pericardial effusion. Mitral Valve: The mitral valve is normal in structure. Moderate mitral annular calcification. No evidence of mitral valve regurgitation. No evidence of mitral valve stenosis. Tricuspid Valve: The tricuspid valve is normal in structure. Tricuspid valve regurgitation is not demonstrated. No evidence of tricuspid stenosis. Aortic Valve: The aortic valve is calcified. There is mild calcification of the aortic valve. There is mild thickening of the aortic valve. Aortic valve regurgitation is not visualized. Moderate aortic stenosis is present. Aortic valve mean gradient measures 23.0 mmHg. Aortic valve peak gradient measures 38.9 mmHg. Aortic valve area, by VTI measures 1.88 cm. Pulmonic Valve: The pulmonic valve was normal in structure. Pulmonic valve regurgitation is mild. No evidence of pulmonic stenosis. Aorta: The aortic root is normal in size and structure. Venous: The inferior vena cava is dilated in size with less than 50% respiratory variability, suggesting right atrial pressure of 15 mmHg. IAS/Shunts: No atrial level shunt detected by color flow Doppler.  LEFT VENTRICLE PLAX 2D LVIDd:         5.10 cm   Diastology LVIDs:         2.30 cm   LV e' medial:  5.38 cm/s LV PW:         1.60 cm   LV e' lateral: 5.87 cm/s LV IVS:        1.60 cm LVOT diam:     2.10 cm LV SV:         133 LV SV Index:   51 LVOT Area:     3.46 cm  RIGHT VENTRICLE RV S prime:     12.30 cm/s TAPSE (M-mode): 2.2 cm LEFT ATRIUM              Index        RIGHT ATRIUM           Index LA diam:        5.20 cm  1.98 cm/m   RA Area:     22.30 cm LA Vol (A2C):   141.0 ml 53.71 ml/m  RA Volume:   76.50 ml  29.14 ml/m LA Vol  (A4C):   96.5 ml  36.76 ml/m LA Biplane Vol: 118.0 ml 44.95 ml/m  AORTIC VALVE AV Area (Vmax):    1.74 cm AV Area (Vmean):   1.71 cm AV Area (VTI):     1.88 cm AV Vmax:           312.00 cm/s AV Vmean:          217.000 cm/s AV VTI:            0.707 m AV Peak Grad:      38.9 mmHg AV Mean Grad:      23.0 mmHg LVOT Vmax:         157.00 cm/s LVOT Vmean:        107.000 cm/s LVOT VTI:  0.383 m LVOT/AV VTI ratio: 0.54  AORTA Ao Root diam: 3.50 cm TRICUSPID VALVE TR Peak grad:   34.3 mmHg TR Vmax:        293.00 cm/s  SHUNTS Systemic VTI:  0.38 m Systemic Diam: 2.10 cm Candee Furbish MD Electronically signed by Candee Furbish MD Signature Date/Time: 10/10/2021/3:40:47 PM    Final    US Venous Img Lower Bilateral  Result Date: 10/09/2021 CLINICAL DATA:  Bilateral ankle swelling. EXAM: BILATERAL LOWER EXTREMITY VENOUS DOPPLER ULTRASOUND TECHNIQUE: Gray-scale sonography with graded compression, as well as color Doppler and duplex ultrasound were performed to evaluate the lower extremity deep venous systems from the level of the common femoral vein and including the common femoral, femoral, profunda femoral, popliteal and calf veins including the posterior tibial, peroneal and gastrocnemius veins when visible. The superficial great saphenous vein was also interrogated. Spectral Doppler was utilized to evaluate flow at rest and with distal augmentation maneuvers in the common femoral, femoral and popliteal veins. COMPARISON:  None Available. FINDINGS: RIGHT LOWER EXTREMITY Common Femoral Vein: No evidence of thrombus. Normal compressibility, respiratory phasicity and response to augmentation. Saphenofemoral Junction: No evidence of thrombus. Normal compressibility and flow on color Doppler imaging. Profunda Femoral Vein: No evidence of thrombus. Normal compressibility and flow on color Doppler imaging. Femoral Vein: No evidence of thrombus. Normal compressibility, respiratory phasicity and response to augmentation.  Popliteal Vein: No evidence of thrombus. Normal compressibility, respiratory phasicity and response to augmentation. Calf Veins: No evidence of thrombus. Normal flow on color Doppler imaging. Compressibility is not well assessed due to patient's body habitus. Superficial Great Saphenous Vein: No evidence of thrombus. Normal compressibility. Venous Reflux:  None. Other Findings: Examination is limited due to patient's body habitus. Mild subcutaneous edema is noted at the ankle. LEFT LOWER EXTREMITY Common Femoral Vein: No evidence of thrombus. Normal compressibility, respiratory phasicity and response to augmentation. Saphenofemoral Junction: No evidence of thrombus. Normal compressibility and flow on color Doppler imaging. Profunda Femoral Vein: No evidence of thrombus. Normal compressibility and flow on color Doppler imaging. Femoral Vein: No evidence of thrombus. Normal compressibility, respiratory phasicity and response to augmentation. Popliteal Vein: No evidence of thrombus. Normal compressibility, respiratory phasicity and response to augmentation. Calf Veins: No evidence of thrombus. Normal flow on color Doppler imaging. Compressibility is not well assessed due to patient's body habitus. Superficial Great Saphenous Vein: No evidence of thrombus. Normal compressibility. Venous Reflux:  None. Other Findings:  Examination limited due to patient's body habitus. IMPRESSION: No definite evidence of deep venous thrombosis in either lower extremity. Electronically Signed   By: Brett Fairy M.D.   On: 10/09/2021 21:16   DG Chest 2 View  Result Date: 10/09/2021 CLINICAL DATA:  Shortness of breath EXAM: CHEST - 2 VIEW COMPARISON:  08/12/2020 FINDINGS: Transverse diameter of heart is increased. Central pulmonary vessels are prominent. There are no signs of alveolar pulmonary edema or focal pulmonary consolidation. Posterior costophrenic angles are clear. There is no pneumothorax. IMPRESSION: Cardiomegaly. Central  pulmonary vessels are prominent suggesting CHF. There is no focal pulmonary consolidation. Electronically Signed   By: Elmer Picker M.D.   On: 10/09/2021 18:00         For questions or updates, please contact [place] Please consult [email or web address] for contact info under        Signed, Glenetta Hew, MD  10/11/2021, 10:18 AM

## 2021-10-11 NOTE — Progress Notes (Signed)
Rounding Note    Patient Name: Stacey Page Date of Encounter: 10/12/2021  [place] Cardiologist: Glenetta Hew, MD   Subjective   Had episode of confusion and hypoxia this morning. Feels much better now. No chest pain or SOB.  BP elevated 140-160s HR in 60s Remains on 3L [place] Cr improved 3.31>2.76 UOP not reliable Wt 360>347  Inpatient Medications    Scheduled Meds:  aspirin  81 mg Oral Daily   atorvastatin  20 mg Oral Daily   carvedilol  6.25 mg Oral BID WC   Chlorhexidine Gluconate Cloth  6 each Topical Daily   furosemide  80 mg Intravenous BID   heparin injection (subcutaneous)  5,000 Units Subcutaneous Q8H   hydrALAZINE  25 mg Oral Q8H   hydrocerin   Topical BID   insulin aspart  0-15 Units Subcutaneous TID WC   isosorbide dinitrate  10 mg Oral TID   mouth rinse  15 mL Mouth Rinse 4 times per day   pantoprazole  40 mg Oral Daily   Continuous Infusions:  PRN Meds: ipratropium-albuterol, mouth rinse   Vital Signs    Vitals:   10/11/21 2355 10/12/21 0420 10/12/21 0437 10/12/21 0836  BP: (!) 158/55 (!) 161/68  (!) 145/65  Pulse: 65 67  62  Resp: 20 16  (!) 22  Temp: 99 F (37.2 C) 98.9 F (37.2 C)  98.1 F (36.7 C)  TempSrc: Oral Oral  Oral  SpO2: 100% 94%  94%  Weight:   (!) 157.4 kg   Height:        Intake/Output Summary (Last 24 hours) at 10/12/2021 1109 Last data filed at 10/12/2021 0900 Gross per 24 hour  Intake 480 ml  Output 2800 ml  Net -2320 ml      10/12/2021    4:37 AM 10/11/2021    4:38 PM 10/10/2021    1:00 AM  Last 3 Weights  Weight (lbs) 347 lb 0.1 oz 353 lb 12.8 oz 360 lb 7.2 oz  Weight (kg) 157.4 kg 160.483 kg 163.5 kg      Telemetry    NSR - Personally Reviewed  ECG    No new tracing - Personally Reviewed  Physical Exam   GEN: Laying in bed, comfortable Neck: JVD difficult to assess due to body habitus Cardiac: RRR, 2/6 systolic murmur.  Respiratory: Clear to auscultation bilaterally. GI: Soft,  nontender, non-distended  MS: No edema; No deformity. Neuro:  Nonfocal  Psych: Normal affect   Labs    High Sensitivity Troponin:   Recent Labs  Lab 10/09/21 1722 10/09/21 1956 10/10/21 0533  TROPONINIHS 202* 226* 224*     Chemistry Recent Labs  Lab 10/10/21 0533 10/10/21 0744 10/11/21 0009 10/12/21 0022  NA  --  142 143 142  K  --  4.8 4.6 4.6  CL  --  108 107 104  CO2  --  '29 29 31  '$ GLUCOSE  --  128* 95 110*  BUN  --  48* 50* 47*  CREATININE  --  3.24* 3.31* 2.76*  CALCIUM  --  8.8* 8.8* 8.9  MG 2.5*  --   --   --   GFRNONAA  --  15* 15* 18*  ANIONGAP  --  '5 7 7    '$ Lipids No results for input(s): "CHOL", "TRIG", "HDL", "LABVLDL", "LDLCALC", "CHOLHDL" in the last 168 hours.  Hematology Recent Labs  Lab 10/10/21 0533 10/11/21 0009 10/12/21 0022  WBC 5.8 5.2 5.7  RBC 3.79* 3.79*  3.70*  HGB 11.1* 11.3* 11.0*  HCT 37.7 38.2 36.9  MCV 99.5 100.8* 99.7  MCH 29.3 29.8 29.7  MCHC 29.4* 29.6* 29.8*  RDW 16.6* 16.6* 16.0*  PLT 144* 143* 149*   Thyroid No results for input(s): "TSH", "FREET4" in the last 168 hours.  BNP Recent Labs  Lab 10/09/21 1722 10/11/21 0009  BNP 576.7* 478.7*    DDimer No results for input(s): "DDIMER" in the last 168 hours.   Radiology    ECHOCARDIOGRAM COMPLETE  Result Date: 10/10/2021    ECHOCARDIOGRAM REPORT   Patient Name:   RAMEY KETCHERSIDE Date of Exam: 10/10/2021 Medical Rec #:  [id number]        Height:       68.0 in Accession #:    [id number]       Weight:       360.4 lb Date of Birth:  1950/09/26       BSA:          2.625 m Patient Age:    55 years         BP:           90/51 mmHg Patient Gender: F                HR:           51 bpm. Exam Location:  Inpatient Procedure: 2D Echo, Cardiac Doppler and Color Doppler Indications:    Shock  History:        Patient has prior history of Echocardiogram examinations, most                 recent 06/14/2021. CHF; Risk Factors:Sleep Apnea, Hypertension,                 Dyslipidemia, Diabetes  and Former Smoker. CKD.  Sonographer:    Alvino Chapel RCS Referring Phys: [id number] Secor  1. Left ventricular ejection fraction, by estimation, is 70 to 75%. The left ventricle has hyperdynamic function. The left ventricle has no regional wall motion abnormalities. There is moderate left ventricular hypertrophy. Left ventricular diastolic parameters are indeterminate.  2. Right ventricular systolic function is normal. The right ventricular size is normal. There is moderately elevated pulmonary artery systolic pressure.  3. Left atrial size was moderately dilated.  4. The mitral valve is normal in structure. No evidence of mitral valve regurgitation. No evidence of mitral stenosis. Moderate mitral annular calcification.  5. The aortic valve is calcified. There is mild calcification of the aortic valve. There is mild thickening of the aortic valve. Aortic valve regurgitation is not visualized. Moderate aortic valve stenosis. Aortic valve area, by VTI measures 1.88 cm. Aortic valve mean gradient measures 23.0 mmHg. Aortic valve Vmax measures 3.12 m/s.  6. The inferior vena cava is dilated in size with <50% respiratory variability, suggesting right atrial pressure of 15 mmHg. Comparison(s): Prior aortic valve gradient 18 mmHg. FINDINGS  Left Ventricle: Left ventricular ejection fraction, by estimation, is 70 to 75%. The left ventricle has hyperdynamic function. The left ventricle has no regional wall motion abnormalities. The left ventricular internal cavity size was normal in size. There is moderate left ventricular hypertrophy. Left ventricular diastolic parameters are indeterminate. Right Ventricle: The right ventricular size is normal. No increase in right ventricular wall thickness. Right ventricular systolic function is normal. There is moderately elevated pulmonary artery systolic pressure. The tricuspid regurgitant velocity is 2.93 m/s, and with an assumed right atrial pressure of 15  mmHg, the  estimated right ventricular systolic pressure is 35.5 mmHg. Left Atrium: Left atrial size was moderately dilated. Right Atrium: Right atrial size was normal in size. Pericardium: There is no evidence of pericardial effusion. Mitral Valve: The mitral valve is normal in structure. Moderate mitral annular calcification. No evidence of mitral valve regurgitation. No evidence of mitral valve stenosis. Tricuspid Valve: The tricuspid valve is normal in structure. Tricuspid valve regurgitation is not demonstrated. No evidence of tricuspid stenosis. Aortic Valve: The aortic valve is calcified. There is mild calcification of the aortic valve. There is mild thickening of the aortic valve. Aortic valve regurgitation is not visualized. Moderate aortic stenosis is present. Aortic valve mean gradient measures 23.0 mmHg. Aortic valve peak gradient measures 38.9 mmHg. Aortic valve area, by VTI measures 1.88 cm. Pulmonic Valve: The pulmonic valve was normal in structure. Pulmonic valve regurgitation is mild. No evidence of pulmonic stenosis. Aorta: The aortic root is normal in size and structure. Venous: The inferior vena cava is dilated in size with less than 50% respiratory variability, suggesting right atrial pressure of 15 mmHg. IAS/Shunts: No atrial level shunt detected by color flow Doppler.  LEFT VENTRICLE PLAX 2D LVIDd:         5.10 cm   Diastology LVIDs:         2.30 cm   LV e' medial:  5.38 cm/s LV PW:         1.60 cm   LV e' lateral: 5.87 cm/s LV IVS:        1.60 cm LVOT diam:     2.10 cm LV SV:         133 LV SV Index:   51 LVOT Area:     3.46 cm  RIGHT VENTRICLE RV S prime:     12.30 cm/s TAPSE (M-mode): 2.2 cm LEFT ATRIUM              Index        RIGHT ATRIUM           Index LA diam:        5.20 cm  1.98 cm/m   RA Area:     22.30 cm LA Vol (A2C):   141.0 ml 53.71 ml/m  RA Volume:   76.50 ml  29.14 ml/m LA Vol (A4C):   96.5 ml  36.76 ml/m LA Biplane Vol: 118.0 ml 44.95 ml/m  AORTIC VALVE AV Area  (Vmax):    1.74 cm AV Area (Vmean):   1.71 cm AV Area (VTI):     1.88 cm AV Vmax:           312.00 cm/s AV Vmean:          217.000 cm/s AV VTI:            0.707 m AV Peak Grad:      38.9 mmHg AV Mean Grad:      23.0 mmHg LVOT Vmax:         157.00 cm/s LVOT Vmean:        107.000 cm/s LVOT VTI:          0.383 m LVOT/AV VTI ratio: 0.54  AORTA Ao Root diam: 3.50 cm TRICUSPID VALVE TR Peak grad:   34.3 mmHg TR Vmax:        293.00 cm/s  SHUNTS Systemic VTI:  0.38 m Systemic Diam: 2.10 cm Candee Furbish MD Electronically signed by Candee Furbish MD Signature Date/Time: 10/10/2021/3:40:47 PM    Final     Cardiac Studies  TTE 10/10/21: IMPRESSIONS     1. Left ventricular ejection fraction, by estimation, is 70 to 75%. The  left ventricle has hyperdynamic function. The left ventricle has no  regional wall motion abnormalities. There is moderate left ventricular  hypertrophy. Left ventricular diastolic  parameters are indeterminate.   2. Right ventricular systolic function is normal. The right ventricular  size is normal. There is moderately elevated pulmonary artery systolic  pressure.   3. Left atrial size was moderately dilated.   4. The mitral valve is normal in structure. No evidence of mitral valve  regurgitation. No evidence of mitral stenosis. Moderate mitral annular  calcification.   5. The aortic valve is calcified. There is mild calcification of the  aortic valve. There is mild thickening of the aortic valve. Aortic valve  regurgitation is not visualized. Moderate aortic valve stenosis. Aortic  valve area, by VTI measures 1.88 cm.  Aortic valve mean gradient measures 23.0 mmHg. Aortic valve Vmax measures  3.12 m/s.   6. The inferior vena cava is dilated in size with <50% respiratory  variability, suggesting right atrial pressure of 15 mmHg.   Comparison(s): Prior aortic valve gradient 18 mmHg.   Patient Profile     71 y.o. female chronic diastolic heart failure, COPD, OSA, HTN, CKD stage  IV, obesity, and DMII who presented with shortness of breath found to have acute respiratory failure with hypoxia and hypercarbia. Cardiology was consulted for further management of acute on chronic diastolic HF.   Assessment & Plan    #Acute on Chronic Diastolic HF: Patient presented with worsening SOB found to have acute hypoxic and hypercarbic respiratory failure due to underlying acute on chronic diastolic HF. Likely triggered by medication non-adherence. TTE with LVEF 70-75%, normal RV, moderate pulmonary HTN, moderate AS, RAP 63mHg. Has been diuresed with IV lasix with good response. Will continue today.   -Continue lasix '80mg'$  BID -No farxiga or spiro due to CKD IV -Monitor I/Os and daily weights -Low Na diet  #Acute Respiratory Failure with Hypoxia and Hypercarbia: Due to underlying acute on chronic diastolic HF. Required BiPAP on admission now [facility] Manage HFpEF as above.  #Moderate AS: AVA 1.88cm2, mean gradient 241mg, Vmax 3.1272m -Continue serial management as outpatient.  #CKD IV: Cr improving with diuresis. Will trend. -Trend BMET  #HTN: Elevated to 140-160s today -Increase hydralazine to '50mg'$  TID -Continue isordil '10mg'$  TID -Continue coreg 6.'25mg'$  BID  #OSA on CPAP: -Continue CPAP  #HLD: -Continue lipitor '20mg'$  daily      For questions or updates, please contact [facility] consult [email or web address] for contact info under        Signed, HeaFreada BergeronD  10/12/2021, 11:09 AM

## 2021-10-12 DIAGNOSIS — I5033 Acute on chronic diastolic (congestive) heart failure: Secondary | ICD-10-CM | POA: Diagnosis not present

## 2021-10-12 DIAGNOSIS — I350 Nonrheumatic aortic (valve) stenosis: Secondary | ICD-10-CM | POA: Diagnosis not present

## 2021-10-12 DIAGNOSIS — J9602 Acute respiratory failure with hypercapnia: Secondary | ICD-10-CM | POA: Diagnosis not present

## 2021-10-12 DIAGNOSIS — N182 Chronic kidney disease, stage 2 (mild): Secondary | ICD-10-CM | POA: Diagnosis not present

## 2021-10-12 DIAGNOSIS — E785 Hyperlipidemia, unspecified: Secondary | ICD-10-CM | POA: Diagnosis not present

## 2021-10-12 DIAGNOSIS — I5032 Chronic diastolic (congestive) heart failure: Secondary | ICD-10-CM | POA: Diagnosis not present

## 2021-10-12 DIAGNOSIS — N179 Acute kidney failure, unspecified: Secondary | ICD-10-CM | POA: Diagnosis not present

## 2021-10-12 DIAGNOSIS — I248 Other forms of acute ischemic heart disease: Secondary | ICD-10-CM | POA: Diagnosis not present

## 2021-10-12 DIAGNOSIS — J849 Interstitial pulmonary disease, unspecified: Secondary | ICD-10-CM | POA: Diagnosis not present

## 2021-10-12 DIAGNOSIS — G4733 Obstructive sleep apnea (adult) (pediatric): Secondary | ICD-10-CM | POA: Diagnosis not present

## 2021-10-12 DIAGNOSIS — I110 Hypertensive heart disease with heart failure: Secondary | ICD-10-CM | POA: Diagnosis not present

## 2021-10-12 DIAGNOSIS — I5031 Acute diastolic (congestive) heart failure: Secondary | ICD-10-CM | POA: Diagnosis not present

## 2021-10-12 DIAGNOSIS — E1169 Type 2 diabetes mellitus with other specified complication: Secondary | ICD-10-CM | POA: Diagnosis not present

## 2021-10-12 LAB — CBC
HCT: 36.9 % (ref 36.0–46.0)
Hemoglobin: 11 g/dL — ABNORMAL LOW (ref 12.0–15.0)
MCH: 29.7 pg (ref 26.0–34.0)
MCHC: 29.8 g/dL — ABNORMAL LOW (ref 30.0–36.0)
MCV: 99.7 fL (ref 80.0–100.0)
Platelets: 149 10*3/uL — ABNORMAL LOW (ref 150–400)
RBC: 3.7 MIL/uL — ABNORMAL LOW (ref 3.87–5.11)
RDW: 16 % — ABNORMAL HIGH (ref 11.5–15.5)
WBC: 5.7 10*3/uL (ref 4.0–10.5)
nRBC: 0 % (ref 0.0–0.2)

## 2021-10-12 LAB — BASIC METABOLIC PANEL
Anion gap: 7 (ref 5–15)
BUN: 47 mg/dL — ABNORMAL HIGH (ref 8–23)
CO2: 31 mmol/L (ref 22–32)
Calcium: 8.9 mg/dL (ref 8.9–10.3)
Chloride: 104 mmol/L (ref 98–111)
Creatinine, Ser: 2.76 mg/dL — ABNORMAL HIGH (ref 0.44–1.00)
GFR, Estimated: 18 mL/min — ABNORMAL LOW (ref >60)
Glucose, Bld: 110 mg/dL — ABNORMAL HIGH (ref 70–99)
Potassium: 4.6 mmol/L (ref 3.5–5.1)
Sodium: 142 mmol/L (ref 135–145)

## 2021-10-12 LAB — GLUCOSE, CAPILLARY
Glucose-Capillary: 137 mg/dL — ABNORMAL HIGH (ref 70–99)
Glucose-Capillary: 125 mg/dL — ABNORMAL HIGH (ref 70–99)
Glucose-Capillary: 101 mg/dL — ABNORMAL HIGH (ref 70–99)
Glucose-Capillary: 119 mg/dL — ABNORMAL HIGH (ref 70–99)
Glucose-Capillary: 117 mg/dL — ABNORMAL HIGH (ref 70–99)

## 2021-10-12 MED ORDER — HYDRALAZINE HCL 50 MG PO TABS
50.0000 mg | ORAL_TABLET | Freq: Three times a day (TID) | ORAL | Status: DC
  Administered 2021-10-12 – 2021-10-15 (×10): 50 mg via ORAL
  Filled 2021-10-12 (×12): qty 1

## 2021-10-12 MED ORDER — HYDROCERIN EX CREA
TOPICAL_CREAM | Freq: Two times a day (BID) | CUTANEOUS | Status: DC
  Filled 2021-10-12: qty 113

## 2021-10-12 NOTE — Progress Notes (Signed)
Mobility Specialist Progress Note:   10/12/21 1138  Mobility  Activity Ambulated with assistance in hallway  Level of Assistance Standby assist, set-up cues, supervision of patient - no hands on  Assistive Device  (hall rails)  Distance Ambulated (ft) 100 ft  Activity Response Tolerated well  $Mobility charge 1 Mobility   Pt received in bed willing to participate in mobility. No complaints of pain. Left EOB with call bell in reach and all needs met.   [facility] Ashad Fawbush Mobility Specialist

## 2021-10-12 NOTE — Progress Notes (Signed)
Pt stated she doesn't need assistance with her home CPAP machine.

## 2021-10-12 NOTE — Progress Notes (Signed)
SATURATION QUALIFICATIONS: (This note is used to comply with regulatory documentation for home oxygen)  Patient Saturations on Room Air at Rest = 90%  Patient Saturations on Room Air while Ambulating = 83%  Patient Saturations on 3 Liters of oxygen while Ambulating = 93%

## 2021-10-12 NOTE — Plan of Care (Signed)
  Problem: Clinical Measurements: Goal: Cardiovascular complication will be avoided Outcome: Progressing   Problem: Activity: Goal: Risk for activity intolerance will decrease Outcome: Progressing   Problem: Coping: Goal: Level of anxiety will decrease Outcome: Progressing   Problem: Elimination: Goal: Will not experience complications related to urinary retention Outcome: Progressing   Problem: Pain Managment: Goal: General experience of comfort will improve Outcome: Progressing   Problem: Clinical Measurements: Goal: Respiratory complications will improve Outcome: Not Progressing

## 2021-10-12 NOTE — Progress Notes (Signed)
[place]  on 3l [place] to low 80's, o2 increased to 4L , sat-90%

## 2021-10-12 NOTE — Progress Notes (Signed)
PROGRESS NOTE    Stacey Page  WNU:[id number] DOB: Mar 25, 1950 DOA: 10/09/2021 PCP: [name], MD   Brief Narrative: Stacey Page is a 71 y.o. female with a history of heart failure, aortic stenosis, hypertension, hyperlipidemia, OSA, obesity, COPD, CKD stage IV and diabetes mellitus type 2. Patient presented secondary to shortness of breath and was found to have evidence of acute respiratory failure with hypoxia/hypercarbia and mental status changes. She was also noted to have evidence of acute heart failure. Patient managed initially on BiPAP with improvement of mental status. Lasix IV initiated for management of her acute heart failure. Cardiology consulted for management of heart failure.   Assessment and Plan:  Acute respiratory failure with hypoxia and hypercapnia Secondary to acute heart failure. Patient required BiPAP on admission and weaned to oxygen via nasal canula. -Wean to room air as able -Ambulatory pulse ox  Acute on chronic diastolic heart failure Secondary to medication non-adherence. Associated acute pulmonary edema. Patient started on Lasix IV diuresis. Cardiology consulted. Transthoracic Echocardiogram obtained on 9/3 significant for preserved LVEF with no RWMA and moderate LV hypertrophy. Weight on admission of 163.5 kg (360.45 lbs). Weight of 157.4 kg (347 lbs) today. UOP not reliable. -Cardiology recommendations: Lasix 80 mg IV, resumed Coreg; pending today  AKI on CKD stage IV Baseline creatinine appears to be around 1.8-2. Creatinine of 3.35 on admission in setting of fluid overload. Creatinine down to 3.31 this morning with continued Lasix IV diuresis -IV diuresis as mentioned above  Elevated troponin 202->225>224. Likely demand ischemia per cardiology; likely secondary to acute heart failure.  Primary hypertension Patent is on Coreg, hydralazine, isosorbide dinitrate and lisinopril as an outpatient which were held on admission. Some  hypotension overnight.  COPD -Continue Duoneb  OSA -CPAP  Aortic stenosis Moderate stenosis on most recent Transthoracic Echocardiogram.  Hyperlipidemia -Continue Lipitor  Normocytic anemia Chronic. Stable.  Diabetes mellitus type 2 Diabetes is controlled. Most recent hemoglobin A1C of 6.0%. Patient is on Januvia as an outpatient. -Continue SSI  DVT prophylaxis: Heparin subcu Code Status:   Code Status: Prior Family Communication: None at bedside Disposition Plan: Discharge home pending specialist recommendations   Consultants:  PCCM Cardiology  Procedures:  BiPAP Transthoracic Echocardiogram  Antimicrobials: None    Subjective: Patient reports breathing better. No chest pain.  Objective: BP (!) 161/68 (BP Location: Right Arm)   Pulse 67   Temp 98.9 F (37.2 C) (Oral)   Resp 16   Ht '5\' 8"'$  (1.727 m)   Wt (!) 157.4 kg   SpO2 94%   BMI 52.76 kg/m   Examination:  General exam: Appears calm and comfortable Respiratory system: Clear to auscultation. Respiratory effort normal. Cardiovascular system: S1 & S2 heard, RRR. 2/6 systolic murmur. Gastrointestinal system: Abdomen is nondistended, soft and nontender. Normal bowel sounds heard. Central nervous system: Alert and oriented. No focal neurological deficits. Musculoskeletal: 2+ BLE edema. No calf tenderness Skin: Dry Psychiatry: Judgement and insight appear normal. Mood & affect appropriate.     Data Reviewed: I have personally reviewed following labs and imaging studies  CBC Lab Results  Component Value Date   WBC 5.7 10/12/2021   RBC 3.70 (L) 10/12/2021   HGB 11.0 (L) 10/12/2021   HCT 36.9 10/12/2021   MCV 99.7 10/12/2021   MCH 29.7 10/12/2021   PLT 149 (L) 10/12/2021   MCHC 29.8 (L) 10/12/2021   RDW 16.0 (H) 10/12/2021   LYMPHSABS 1.2 07/14/2021   MONOABS 0.3 07/14/2021   EOSABS 0.2 07/14/2021  BASOSABS 0.0 09/73/5329     Last metabolic panel Lab Results  Component Value Date    NA 142 10/12/2021   K 4.6 10/12/2021   CL 104 10/12/2021   CO2 31 10/12/2021   BUN 47 (H) 10/12/2021   CREATININE 2.76 (H) 10/12/2021   GLUCOSE 110 (H) 10/12/2021   GFRNONAA 18 (L) 10/12/2021   GFRAA 40 (L) 10/28/2019   CALCIUM 8.9 10/12/2021   PHOS 3.7 02/16/2021   PROT 7.6 07/14/2021   ALBUMIN 3.9 07/14/2021   LABGLOB 3.4 10/14/2020   AGRATIO 1.2 10/14/2020   BILITOT 0.3 07/14/2021   ALKPHOS 65 07/14/2021   AST 10 (L) 07/14/2021   ALT 7 07/14/2021   ANIONGAP 7 10/12/2021    GFR: Estimated Creatinine Clearance: 30.8 mL/min (A) (by C-G formula based on SCr of 2.76 mg/dL (H)).  Recent Results (from the past 240 hour(s))  Resp Panel by RT-PCR (Flu A&B, Covid) Anterior Nasal Swab     Status: None   Collection Time: 10/09/21  5:22 PM   Specimen: Anterior Nasal Swab  Result Value Ref Range Status   SARS Coronavirus 2 by RT PCR NEGATIVE NEGATIVE Final    Comment: (NOTE) SARS-CoV-2 target nucleic acids are NOT DETECTED.  The SARS-CoV-2 RNA is generally detectable in upper respiratory specimens during the acute phase of infection. The lowest concentration of SARS-CoV-2 viral copies this assay can detect is 138 copies/mL. A negative result does not preclude SARS-Cov-2 infection and should not be used as the sole basis for treatment or other patient management decisions. A negative result may occur with  improper specimen collection/handling, submission of specimen other than nasopharyngeal swab, presence of viral mutation(s) within the areas targeted by this assay, and inadequate number of viral copies(<138 copies/mL). A negative result must be combined with clinical observations, patient history, and epidemiological information. The expected result is Negative.  Fact Sheet for Patients:  EntrepreneurPulse.com.au  Fact Sheet for Healthcare Providers:  IncredibleEmployment.be  This test is no t yet approved or cleared by the [place]  FDA and  has been authorized for detection and/or diagnosis of SARS-CoV-2 by FDA under an Emergency Use Authorization (EUA). This EUA will remain  in effect (meaning this test can be used) for the duration of the COVID-19 declaration under Section 564(b)(1) of the Act, 21 U.S.C.section 360bbb-3(b)(1), unless the authorization is terminated  or revoked sooner.       Influenza A by PCR NEGATIVE NEGATIVE Final   Influenza B by PCR NEGATIVE NEGATIVE Final    Comment: (NOTE) The Xpert Xpress SARS-CoV-2/FLU/RSV plus assay is intended as an aid in the diagnosis of influenza from Nasopharyngeal swab specimens and should not be used as a sole basis for treatment. Nasal washings and aspirates are unacceptable for Xpert Xpress SARS-CoV-2/FLU/RSV testing.  Fact Sheet for Patients: EntrepreneurPulse.com.au  Fact Sheet for Healthcare Providers: IncredibleEmployment.be  This test is not yet approved or cleared by the [place] FDA and has been authorized for detection and/or diagnosis of SARS-CoV-2 by FDA under an Emergency Use Authorization (EUA). This EUA will remain in effect (meaning this test can be used) for the duration of the COVID-19 declaration under Section 564(b)(1) of the Act, 21 U.S.C. section 360bbb-3(b)(1), unless the authorization is terminated or revoked.  Performed at [facility], [address], [place], [place] [address]   MRSA Next Gen by PCR, Nasal     Status: None   Collection Time: 10/10/21 12:52 AM   Specimen: Nasal Mucosa; Nasal Swab  Result Value Ref Range Status   MRSA by PCR Next Gen NOT DETECTED NOT DETECTED Final    Comment: (NOTE) The GeneXpert MRSA Assay (FDA approved for NASAL specimens only), is one component of a comprehensive MRSA colonization surveillance program. It is not intended to diagnose MRSA infection nor to guide or monitor treatment for MRSA infections. Test performance is not  FDA approved in patients less than 22 years old. Performed at [facility], [place] [address] [place], [place] [address]       Radiology Studies: ECHOCARDIOGRAM COMPLETE  Result Date: 10/10/2021    ECHOCARDIOGRAM REPORT   Patient Name:   DAHIANA KULAK Date of Exam: 10/10/2021 Medical Rec #:  [id number]        Height:       68.0 in Accession #:    [id number]       Weight:       360.4 lb Date of Birth:  1950-03-27       BSA:          2.625 m Patient Age:    75 years         BP:           90/51 mmHg Patient Gender: F                HR:           51 bpm. Exam Location:  Inpatient Procedure: 2D Echo, Cardiac Doppler and Color Doppler Indications:    Shock  History:        Patient has prior history of Echocardiogram examinations, most                 recent 06/14/2021. CHF; Risk Factors:Sleep Apnea, Hypertension,                 Dyslipidemia, Diabetes and Former Smoker. CKD.  Sonographer:    Alvino Chapel RCS Referring Phys: [id number] [place]  1. Left ventricular ejection fraction, by estimation, is 70 to 75%. The left ventricle has hyperdynamic function. The left ventricle has no regional wall motion abnormalities. There is moderate left ventricular hypertrophy. Left ventricular diastolic parameters are indeterminate.  2. Right ventricular systolic function is normal. The right ventricular size is normal. There is moderately elevated pulmonary artery systolic pressure.  3. Left atrial size was moderately dilated.  4. The mitral valve is normal in structure. No evidence of mitral valve regurgitation. No evidence of mitral stenosis. Moderate mitral annular calcification.  5. The aortic valve is calcified. There is mild calcification of the aortic valve. There is mild thickening of the aortic valve. Aortic valve regurgitation is not visualized. Moderate aortic valve stenosis. Aortic valve area, by VTI measures 1.88 cm. Aortic valve mean gradient measures 23.0 mmHg. Aortic valve Vmax measures  3.12 m/s.  6. The inferior vena cava is dilated in size with <50% respiratory variability, suggesting right atrial pressure of 15 mmHg. Comparison(s): Prior aortic valve gradient 18 mmHg. FINDINGS  Left Ventricle: Left ventricular ejection fraction, by estimation, is 70 to 75%. The left ventricle has hyperdynamic function. The left ventricle has no regional wall motion abnormalities. The left ventricular internal cavity size was normal in size. There is moderate left ventricular hypertrophy. Left ventricular diastolic parameters are indeterminate. Right Ventricle: The right ventricular size is normal. No increase in right ventricular wall thickness. Right ventricular systolic function is normal. There is moderately elevated pulmonary artery systolic pressure. The tricuspid regurgitant velocity is 2.93 m/s, and with  an assumed right atrial pressure of 15 mmHg, the estimated right ventricular systolic pressure is 51.0 mmHg. Left Atrium: Left atrial size was moderately dilated. Right Atrium: Right atrial size was normal in size. Pericardium: There is no evidence of pericardial effusion. Mitral Valve: The mitral valve is normal in structure. Moderate mitral annular calcification. No evidence of mitral valve regurgitation. No evidence of mitral valve stenosis. Tricuspid Valve: The tricuspid valve is normal in structure. Tricuspid valve regurgitation is not demonstrated. No evidence of tricuspid stenosis. Aortic Valve: The aortic valve is calcified. There is mild calcification of the aortic valve. There is mild thickening of the aortic valve. Aortic valve regurgitation is not visualized. Moderate aortic stenosis is present. Aortic valve mean gradient measures 23.0 mmHg. Aortic valve peak gradient measures 38.9 mmHg. Aortic valve area, by VTI measures 1.88 cm. Pulmonic Valve: The pulmonic valve was normal in structure. Pulmonic valve regurgitation is mild. No evidence of pulmonic stenosis. Aorta: The aortic root is normal  in size and structure. Venous: The inferior vena cava is dilated in size with less than 50% respiratory variability, suggesting right atrial pressure of 15 mmHg. IAS/Shunts: No atrial level shunt detected by color flow Doppler.  LEFT VENTRICLE PLAX 2D LVIDd:         5.10 cm   Diastology LVIDs:         2.30 cm   LV e' medial:  5.38 cm/s LV PW:         1.60 cm   LV e' lateral: 5.87 cm/s LV IVS:        1.60 cm LVOT diam:     2.10 cm LV SV:         133 LV SV Index:   51 LVOT Area:     3.46 cm  RIGHT VENTRICLE RV S prime:     12.30 cm/s TAPSE (M-mode): 2.2 cm LEFT ATRIUM              Index        RIGHT ATRIUM           Index LA diam:        5.20 cm  1.98 cm/m   RA Area:     22.30 cm LA Vol (A2C):   141.0 ml 53.71 ml/m  RA Volume:   76.50 ml  29.14 ml/m LA Vol (A4C):   96.5 ml  36.76 ml/m LA Biplane Vol: 118.0 ml 44.95 ml/m  AORTIC VALVE AV Area (Vmax):    1.74 cm AV Area (Vmean):   1.71 cm AV Area (VTI):     1.88 cm AV Vmax:           312.00 cm/s AV Vmean:          217.000 cm/s AV VTI:            0.707 m AV Peak Grad:      38.9 mmHg AV Mean Grad:      23.0 mmHg LVOT Vmax:         157.00 cm/s LVOT Vmean:        107.000 cm/s LVOT VTI:          0.383 m LVOT/AV VTI ratio: 0.54  AORTA Ao Root diam: 3.50 cm TRICUSPID VALVE TR Peak grad:   34.3 mmHg TR Vmax:        293.00 cm/s  SHUNTS Systemic VTI:  0.38 m Systemic Diam: 2.10 cm Candee Furbish MD Electronically signed by Candee Furbish MD Signature Date/Time: 10/10/2021/3:40:47 PM  Final       LOS: 2 days    Cordelia Poche, MD [facility] 10/12/2021, 7:40 AM   If 7PM-7AM, please contact night-coverage [email or web address]

## 2021-10-13 DIAGNOSIS — I248 Other forms of acute ischemic heart disease: Secondary | ICD-10-CM | POA: Diagnosis not present

## 2021-10-13 DIAGNOSIS — J9602 Acute respiratory failure with hypercapnia: Secondary | ICD-10-CM | POA: Diagnosis not present

## 2021-10-13 DIAGNOSIS — N179 Acute kidney failure, unspecified: Secondary | ICD-10-CM | POA: Diagnosis not present

## 2021-10-13 DIAGNOSIS — I5033 Acute on chronic diastolic (congestive) heart failure: Secondary | ICD-10-CM | POA: Diagnosis not present

## 2021-10-13 LAB — CBC
HCT: 39.5 % (ref 36.0–46.0)
Hemoglobin: 11.9 g/dL — ABNORMAL LOW (ref 12.0–15.0)
MCH: 29.4 pg (ref 26.0–34.0)
MCHC: 30.1 g/dL (ref 30.0–36.0)
MCV: 97.5 fL (ref 80.0–100.0)
Platelets: 153 10*3/uL (ref 150–400)
RBC: 4.05 MIL/uL (ref 3.87–5.11)
RDW: 15.8 % — ABNORMAL HIGH (ref 11.5–15.5)
WBC: 4.2 10*3/uL (ref 4.0–10.5)
nRBC: 0 % (ref 0.0–0.2)

## 2021-10-13 LAB — MAGNESIUM: Magnesium: 1.6 mg/dL — ABNORMAL LOW (ref 1.7–2.4)

## 2021-10-13 LAB — BLOOD GAS, VENOUS
Acid-Base Excess: 18.7 mmol/L — ABNORMAL HIGH (ref 0.0–2.0)
Bicarbonate: 47.1 mmol/L — ABNORMAL HIGH (ref 20.0–28.0)
O2 Saturation: 68.8 %
Patient temperature: 37
pCO2, Ven: 71 mmHg (ref 44–60)
pH, Ven: 7.43 (ref 7.25–7.43)
pO2, Ven: 39 mmHg (ref 32–45)

## 2021-10-13 LAB — GLUCOSE, CAPILLARY
Glucose-Capillary: 120 mg/dL — ABNORMAL HIGH (ref 70–99)
Glucose-Capillary: 126 mg/dL — ABNORMAL HIGH (ref 70–99)
Glucose-Capillary: 152 mg/dL — ABNORMAL HIGH (ref 70–99)

## 2021-10-13 LAB — BASIC METABOLIC PANEL
Anion gap: 9 (ref 5–15)
BUN: 36 mg/dL — ABNORMAL HIGH (ref 8–23)
CO2: 38 mmol/L — ABNORMAL HIGH (ref 22–32)
Calcium: 9.5 mg/dL (ref 8.9–10.3)
Chloride: 95 mmol/L — ABNORMAL LOW (ref 98–111)
Creatinine, Ser: 2.2 mg/dL — ABNORMAL HIGH (ref 0.44–1.00)
GFR, Estimated: 24 mL/min — ABNORMAL LOW (ref >60)
Glucose, Bld: 183 mg/dL — ABNORMAL HIGH (ref 70–99)
Potassium: 4 mmol/L (ref 3.5–5.1)
Sodium: 142 mmol/L (ref 135–145)

## 2021-10-13 MED ORDER — MAGNESIUM SULFATE 2 GM/50ML IV SOLN
2.0000 g | Freq: Once | INTRAVENOUS | Status: AC
  Administered 2021-10-13: 2 g via INTRAVENOUS
  Filled 2021-10-13: qty 50

## 2021-10-13 NOTE — Progress Notes (Signed)
RT attempted ABG without success. Once RT started to prep to re-stick, patient told RT "Do not stick me like that again, I do not want that".  RT immediately notified MD to make aware to see if VBG was a better option. RT will continue to monitor as needed.

## 2021-10-13 NOTE — Care Management Important Message (Signed)
Important Message  Patient Details  Name: Stacey Page MRN: [id number] Date of Birth: 01-26-1951   Medicare Important Message Given:  Yes     Orbie Pyo 10/13/2021, 2:15 PM

## 2021-10-13 NOTE — Progress Notes (Signed)
Went to place patient on BIPAP per request and patient refused said "no I am not wearing it now.  I want to eat and I am not wearing it."  I left the machine and said she is ordered to wear it for sure at bedtime and she said that was fine but not now.  I let RN know of patient refusal at this time.

## 2021-10-13 NOTE — Progress Notes (Signed)
PROGRESS NOTE    Stacey Page  RSW:[id number] DOB: [birth year] DOA: 10/09/2021 PCP: [name], MD   Brief Narrative: 71 year old with past medical history significant for heart failure, aortic stenosis, hypertension, hyperlipidemia, OSA, obesity, COPD, CKD stage IV and diabetes type 2 presents complaining of shortness of breath, found to be in acute hypoxic, hypercapnic respiratory failure with mental status changes.  Patient was also found to be in acute heart failure exacerbation.  Initially placed on BiPAP with improvement in mental status.  Managed with IV Lasix.  Cardiology assisting with medical management.     Assessment & Plan:   Principal Problem:   Acute respiratory failure with hypercapnia (HCC) Active Problems:   OSA (obstructive sleep apnea)   Hypertensive heart disease with chronic diastolic congestive heart failure (HCC)   Class 3 severe obesity with serious comorbidity and body mass index (BMI) of 50.0 to 59.9 in adult ([place])   ILD (interstitial lung disease) 2/2 MAI s/p med rxn   Hyperlipidemia associated with type 2 diabetes mellitus ([place])   Acute renal failure superimposed on stage 2 chronic kidney disease (HCC)   Moderate aortic stenosis by prior echocardiogram   Acute on chronic heart failure with preserved ejection fraction (HCC)   Demand ischemia (HCC)   Respiratory acidosis  1-Acute Hypoxic, Hypercapnic respiratory failure Initially required BiPAP.  Currently using CPAP Plan to repeat blood gas. Wean off of oxygen as tolerated Continue with IV Lasix In the setting of acute on chronic diastolic heart failure exacerbation   2-Acute on chronic diastolic heart failure exacerbation: Secondary to medication noncompliance. Continue with IV Lasix Holding Coreg due to bradycardia -4 L Weight: 360---337. Appreciate cardiology assistance  AKI on CKD stage IV;  Creatinine baseline 1.8--2 Presents with Cr 3.3 Monitor on lasix.   Elevated  Troponin; 202---225---224.  Primary HTN;  Continue with hydralazine, Imdur.  Holder parameter for Hypotension.   COPD;  Continue with duoneb  OSA: Continue with CPAP Aortic stenosis: Moderate stenosis. Neurology following  Hyperlipidemia: Continue Lipitor  Normocytic  anemia:  hemoglobin stable  Diabetes type 2: Continue with sliding scale  Hypomagnesemia; replete IV>      Estimated body mass index is 51.25 kg/m as calculated from the following:   Height as of this encounter: '5\' 8"'$  (1.727 m).   Weight as of this encounter: 152.9 kg.   DVT prophylaxis: Heparin Code Status: Full code Family Communication: Care discussed with patient Disposition Plan:  Status is: Inpatient Remains inpatient appropriate because: Continue with IV diuresis    Consultants:  Cardiology   Procedures:  ECHO  Antimicrobials:    Subjective: She is feeling more aware of events. Yesterday she was out,  She is breathing better.    Objective: Vitals:   10/12/21 2317 10/13/21 0413 10/13/21 0420 10/13/21 0729  BP: (!) 104/48 (!) 159/59  (!) 122/50  Pulse: (!) 57 61  65  Resp: '18 13  17  '$ Temp: 98.5 F (36.9 C) 98.2 F (36.8 C)  98.8 F (37.1 C)  TempSrc: Oral Oral  Oral  SpO2: 90% 91%  92%  Weight:   (!) 152.9 kg   Height:        Intake/Output Summary (Last 24 hours) at 10/13/2021 0746 Last data filed at 10/13/2021 0630 Gross per 24 hour  Intake 440 ml  Output 3150 ml  Net -2710 ml   Filed Weights   10/11/21 1638 10/12/21 0437 10/13/21 0420  Weight: (!) 160.5 kg (!) 157.4 kg (!) 152.9 kg  Examination:  General exam: Appears calm and comfortable  Respiratory system: Clear to auscultation. Respiratory effort normal. Cardiovascular system: S1 & S2 heard, RRR. No JVD, murmurs, rubs, gallops or clicks. No pedal edema. Gastrointestinal system: Abdomen is nondistended, soft and nontender. No organomegaly or masses felt. Normal bowel sounds heard. Central nervous system:  Alert and oriented. No focal neurological deficits. Extremities: Symmetric 5 x 5 power. Skin: No rashes, lesions or ulcers Psychiatry: Judgement and insight appear normal. Mood & affect appropriate.     Data Reviewed: I have personally reviewed following labs and imaging studies  CBC: Recent Labs  Lab 10/09/21 1722 10/09/21 2001 10/10/21 0533 10/11/21 0009 10/12/21 0022  WBC 5.9  --  5.8 5.2 5.7  HGB 11.8* 12.6 11.1* 11.3* 11.0*  HCT 39.9 37.0 37.7 38.2 36.9  MCV 99.8  --  99.5 100.8* 99.7  PLT 160  --  144* 143* 935*   Basic Metabolic Panel: Recent Labs  Lab 10/09/21 1722 10/09/21 2001 10/10/21 0533 10/10/21 0744 10/11/21 0009 10/12/21 0022  NA 141 141  --  142 143 142  K 4.8 4.9  --  4.8 4.6 4.6  CL 105  --   --  108 107 104  CO2 30  --   --  '29 29 31  '$ GLUCOSE 125*  --   --  128* 95 110*  BUN 50*  --   --  48* 50* 47*  CREATININE 3.35*  --   --  3.24* 3.31* 2.76*  CALCIUM 9.7  --   --  8.8* 8.8* 8.9  MG  --   --  2.5*  --   --   --    GFR: Estimated Creatinine Clearance: 30.2 mL/min (A) (by C-G formula based on SCr of 2.76 mg/dL (H)). Liver Function Tests: No results for input(s): "AST", "ALT", "ALKPHOS", "BILITOT", "PROT", "ALBUMIN" in the last 168 hours. No results for input(s): "LIPASE", "AMYLASE" in the last 168 hours. No results for input(s): "AMMONIA" in the last 168 hours. Coagulation Profile: No results for input(s): "INR", "PROTIME" in the last 168 hours. Cardiac Enzymes: No results for input(s): "CKTOTAL", "CKMB", "CKMBINDEX", "TROPONINI" in the last 168 hours. BNP (last 3 results) No results for input(s): "PROBNP" in the last 8760 hours. HbA1C: No results for input(s): "HGBA1C" in the last 72 hours. CBG: Recent Labs  Lab 10/12/21 0415 10/12/21 1127 10/12/21 1625 10/12/21 2131 10/13/21 0634  GLUCAP 125* 101* 119* 117* 120*   Lipid Profile: No results for input(s): "CHOL", "HDL", "LDLCALC", "TRIG", "CHOLHDL", "LDLDIRECT" in the last 72  hours. Thyroid Function Tests: No results for input(s): "TSH", "T4TOTAL", "FREET4", "T3FREE", "THYROIDAB" in the last 72 hours. Anemia Panel: No results for input(s): "VITAMINB12", "FOLATE", "FERRITIN", "TIBC", "IRON", "RETICCTPCT" in the last 72 hours. Sepsis Labs: Recent Labs  Lab 10/09/21 1956 10/10/21 0744 10/11/21 0009  PROCALCITON <0.10 <0.10 <0.10  LATICACIDVEN 0.7  0.8  --   --     Recent Results (from the past 240 hour(s))  Resp Panel by RT-PCR (Flu A&B, Covid) Anterior Nasal Swab     Status: None   Collection Time: 10/09/21  5:22 PM   Specimen: Anterior Nasal Swab  Result Value Ref Range Status   SARS Coronavirus 2 by RT PCR NEGATIVE NEGATIVE Final    Comment: (NOTE) SARS-CoV-2 target nucleic acids are NOT DETECTED.  The SARS-CoV-2 RNA is generally detectable in upper respiratory specimens during the acute phase of infection. The lowest concentration of SARS-CoV-2 viral copies this assay can detect  is 138 copies/mL. A negative result does not preclude SARS-Cov-2 infection and should not be used as the sole basis for treatment or other patient management decisions. A negative result may occur with  improper specimen collection/handling, submission of specimen other than nasopharyngeal swab, presence of viral mutation(s) within the areas targeted by this assay, and inadequate number of viral copies(<138 copies/mL). A negative result must be combined with clinical observations, patient history, and epidemiological information. The expected result is Negative.  Fact Sheet for Patients:  EntrepreneurPulse.com.au  Fact Sheet for Healthcare Providers:  IncredibleEmployment.be  This test is no t yet approved or cleared by the [place] FDA and  has been authorized for detection and/or diagnosis of SARS-CoV-2 by FDA under an Emergency Use Authorization (EUA). This EUA will remain  in effect (meaning this test can be used) for the  duration of the COVID-19 declaration under Section 564(b)(1) of the Act, 21 U.S.C.section 360bbb-3(b)(1), unless the authorization is terminated  or revoked sooner.       Influenza A by PCR NEGATIVE NEGATIVE Final   Influenza B by PCR NEGATIVE NEGATIVE Final    Comment: (NOTE) The Xpert Xpress SARS-CoV-2/FLU/RSV plus assay is intended as an aid in the diagnosis of influenza from Nasopharyngeal swab specimens and should not be used as a sole basis for treatment. Nasal washings and aspirates are unacceptable for Xpert Xpress SARS-CoV-2/FLU/RSV testing.  Fact Sheet for Patients: EntrepreneurPulse.com.au  Fact Sheet for Healthcare Providers: IncredibleEmployment.be  This test is not yet approved or cleared by the [place] FDA and has been authorized for detection and/or diagnosis of SARS-CoV-2 by FDA under an Emergency Use Authorization (EUA). This EUA will remain in effect (meaning this test can be used) for the duration of the COVID-19 declaration under Section 564(b)(1) of the Act, 21 U.S.C. section 360bbb-3(b)(1), unless the authorization is terminated or revoked.  Performed at [facility], [address], [place], [place] [address]   MRSA Next Gen by PCR, Nasal     Status: None   Collection Time: 10/10/21 12:52 AM   Specimen: Nasal Mucosa; Nasal Swab  Result Value Ref Range Status   MRSA by PCR Next Gen NOT DETECTED NOT DETECTED Final    Comment: (NOTE) The GeneXpert MRSA Assay (FDA approved for NASAL specimens only), is one component of a comprehensive MRSA colonization surveillance program. It is not intended to diagnose MRSA infection nor to guide or monitor treatment for MRSA infections. Test performance is not FDA approved in patients less than 61 years old. Performed at [facility], [place] [address], [place], [place] [address]          Radiology Studies: No results  found.      Scheduled Meds:  aspirin  81 mg Oral Daily   atorvastatin  20 mg Oral Daily   carvedilol  6.25 mg Oral BID WC   Chlorhexidine Gluconate Cloth  6 each Topical Daily   furosemide  80 mg Intravenous BID   heparin injection (subcutaneous)  5,000 Units Subcutaneous Q8H   hydrALAZINE  50 mg Oral Q8H   hydrocerin   Topical BID   insulin aspart  0-15 Units Subcutaneous TID WC   isosorbide dinitrate  10 mg Oral TID   mouth rinse  15 mL Mouth Rinse 4 times per day   pantoprazole  40 mg Oral Daily   Continuous Infusions:   LOS: 3 days    Time spent: 35 minutes.    Elmarie Shiley, MD [facility]  If 7PM-7AM, please contact night-coverage [email or web address]  10/13/2021, 7:46 AM

## 2021-10-13 NOTE — Plan of Care (Signed)
  Problem: Education: Goal: Knowledge of General Education information will improve Description: Including pain rating scale, medication(s)/side effects and non-pharmacologic comfort measures Outcome: Progressing   Problem: Health Behavior/Discharge Planning: Goal: Ability to manage health-related needs will improve Outcome: Progressing   Problem: Clinical Measurements: Goal: Ability to maintain clinical measurements within normal limits will improve Outcome: Progressing Goal: Will remain free from infection Outcome: Progressing Goal: Diagnostic test results will improve Outcome: Progressing Goal: Respiratory complications will improve Outcome: Progressing Goal: Cardiovascular complication will be avoided Outcome: Progressing   Problem: Activity: Goal: Risk for activity intolerance will decrease Outcome: Progressing   Problem: Nutrition: Goal: Adequate nutrition will be maintained Outcome: Progressing   Problem: Coping: Goal: Level of anxiety will decrease Outcome: Progressing   Problem: Elimination: Goal: Will not experience complications related to bowel motility Outcome: Progressing Goal: Will not experience complications related to urinary retention Outcome: Progressing   Problem: Pain Managment: Goal: General experience of comfort will improve Outcome: Progressing   Problem: Safety: Goal: Ability to remain free from injury will improve Outcome: Progressing   Problem: Skin Integrity: Goal: Risk for impaired skin integrity will decrease Outcome: Progressing   Problem: Education: Goal: Ability to describe self-care measures that may prevent or decrease complications (Diabetes Survival Skills Education) will improve Outcome: Progressing Goal: Individualized Educational Video(s) Outcome: Progressing   Problem: Coping: Goal: Ability to adjust to condition or change in health will improve Outcome: Progressing   Problem: Fluid Volume: Goal: Ability to  maintain a balanced intake and output will improve Outcome: Progressing   Problem: Health Behavior/Discharge Planning: Goal: Ability to identify and utilize available resources and services will improve Outcome: Progressing Goal: Ability to manage health-related needs will improve Outcome: Progressing

## 2021-10-13 NOTE — Progress Notes (Signed)
Rounding Note    Patient Name: Stacey Page Date of Encounter: 10/14/2021  [place] Cardiologist: Glenetta Hew, MD   Subjective   Patient feels better. On 5L [place] this AM. No chest pain or SOB.   I/Os not accurate. Weight not recorded. BMET pending.  Inpatient Medications    Scheduled Meds:  aspirin  81 mg Oral Daily   atorvastatin  20 mg Oral Daily   carvedilol  6.25 mg Oral BID WC   furosemide  80 mg Intravenous BID   heparin injection (subcutaneous)  5,000 Units Subcutaneous Q8H   hydrALAZINE  50 mg Oral Q8H   hydrocerin   Topical BID   insulin aspart  0-15 Units Subcutaneous TID WC   isosorbide dinitrate  10 mg Oral TID   mouth rinse  15 mL Mouth Rinse 4 times per day   pantoprazole  40 mg Oral Daily   Continuous Infusions:  PRN Meds: ipratropium-albuterol, mouth rinse   Vital Signs    Vitals:   10/13/21 2125 10/13/21 2200 10/13/21 2315 10/14/21 0513  BP:   131/63 (!) 141/56  Pulse: 85  (!) 58 (!) 59  Resp: 18 18 (!) 24 14  Temp:   98 F (36.7 C) 97.9 F (36.6 C)  TempSrc:   Oral Oral  SpO2: 96% 90%  94%  Weight:      Height:        Intake/Output Summary (Last 24 hours) at 10/14/2021 0827 Last data filed at 10/14/2021 0600 Gross per 24 hour  Intake --  Output 2700 ml  Net -2700 ml      10/13/2021    4:20 AM 10/12/2021    4:37 AM 10/11/2021    4:38 PM  Last 3 Weights  Weight (lbs) 337 lb 1.3 oz 347 lb 0.1 oz 353 lb 12.8 oz  Weight (kg) 152.9 kg 157.4 kg 160.483 kg      Telemetry    NSR- Personally Reviewed  ECG    No new tracing today - Personally Reviewed  Physical Exam   GEN: Laying in bed, comfortable Neck: JVD difficult to assess due to body habitus Cardiac: RRR, 2/6 systolic murmur.  Respiratory: Clear to auscultation bilaterally. GI: Soft, nontender, non-distended  MS: No edema, warm Neuro:  Nonfocal  Psych: Normal affect   Labs    High Sensitivity Troponin:   Recent Labs  Lab 10/09/21 1722 10/09/21 1956  10/10/21 0533  TROPONINIHS 202* 226* 224*     Chemistry Recent Labs  Lab 10/10/21 0533 10/10/21 0744 10/11/21 0009 10/12/21 0022 10/13/21 0852 10/13/21 0922  NA  --    < > 143 142 142  --   K  --    < > 4.6 4.6 4.0  --   CL  --    < > 107 104 95*  --   CO2  --    < > 29 31 38*  --   GLUCOSE  --    < > 95 110* 183*  --   BUN  --    < > 50* 47* 36*  --   CREATININE  --    < > 3.31* 2.76* 2.20*  --   CALCIUM  --    < > 8.8* 8.9 9.5  --   MG 2.5*  --   --   --   --  1.6*  GFRNONAA  --    < > 15* 18* 24*  --   ANIONGAP  --    < >  $'7 7 9  'm$ --    < > = values in this interval not displayed.    Lipids No results for input(s): "CHOL", "TRIG", "HDL", "LABVLDL", "LDLCALC", "CHOLHDL" in the last 168 hours.  Hematology Recent Labs  Lab 10/11/21 0009 10/12/21 0022 10/13/21 0852  WBC 5.2 5.7 4.2  RBC 3.79* 3.70* 4.05  HGB 11.3* 11.0* 11.9*  HCT 38.2 36.9 39.5  MCV 100.8* 99.7 97.5  MCH 29.8 29.7 29.4  MCHC 29.6* 29.8* 30.1  RDW 16.6* 16.0* 15.8*  PLT 143* 149* 153   Thyroid No results for input(s): "TSH", "FREET4" in the last 168 hours.  BNP Recent Labs  Lab 10/09/21 1722 10/11/21 0009  BNP 576.7* 478.7*    DDimer No results for input(s): "DDIMER" in the last 168 hours.   Radiology    No results found.  Cardiac Studies   TTE 10/10/21: IMPRESSIONS   1. Left ventricular ejection fraction, by estimation, is 70 to 75%. The  left ventricle has hyperdynamic function. The left ventricle has no  regional wall motion abnormalities. There is moderate left ventricular  hypertrophy. Left ventricular diastolic  parameters are indeterminate.   2. Right ventricular systolic function is normal. The right ventricular  size is normal. There is moderately elevated pulmonary artery systolic  pressure.   3. Left atrial size was moderately dilated.   4. The mitral valve is normal in structure. No evidence of mitral valve  regurgitation. No evidence of mitral stenosis. Moderate mitral  annular  calcification.   5. The aortic valve is calcified. There is mild calcification of the  aortic valve. There is mild thickening of the aortic valve. Aortic valve  regurgitation is not visualized. Moderate aortic valve stenosis. Aortic  valve area, by VTI measures 1.88 cm.  Aortic valve mean gradient measures 23.0 mmHg. Aortic valve Vmax measures  3.12 m/s.   6. The inferior vena cava is dilated in size with <50% respiratory  variability, suggesting right atrial pressure of 15 mmHg.   Comparison(s): Prior aortic valve gradient 18 mmHg.   Patient Profile     71 y.o. female chronic diastolic heart failure, COPD, OSA, HTN, CKD stage IV, obesity, and DMII who presented with shortness of breath found to have acute respiratory failure with hypoxia and hypercarbia. Cardiology was consulted for further management of acute on chronic diastolic HF.   Assessment & Plan    #Acute on Chronic Diastolic HF: Patient presented with worsening SOB found to have acute hypoxic and hypercarbic respiratory failure due to underlying acute on chronic diastolic HF. Likely triggered by medication non-adherence. TTE with LVEF 70-75%, normal RV, moderate pulmonary HTN, moderate AS, RAP 50mHg. Has been diuresed with IV lasix with good response. Volume status difficult to assess given body habitus. Given significant O2 requirement this AM (although is laying in bed and requires CPAP at night), will continue IV lasix this AM and follow-up BMET. May be able to transition to PO later today vs tomorrow. -Continue lasix '80mg'$  IV BID; will adjust pending BMET -No farxiga or spiro due to CKD IV -Monitor I/Os and daily weights -Low Na diet  #Acute Respiratory Failure with Hypoxia and Hypercarbia: Due to underlying acute on chronic diastolic HF. Required [facility] on admission [facility] HFpEF as above.  #Moderate AS: AVA 1.88cm2, mean gradient 263mg, Vmax 3.1246m -Continue serial management as  outpatient.  #CKD IV: Cr improving with diuresis. Will trend. -Trend BMET  #HTN: Better controlled today.  -Continue hydralazine '50mg'$  TID -Continue isordil  $'10mg'w$  TID -Continue coreg 6.'25mg'$  BID  #OSA on CPAP: -Continue CPAP  #HLD: -Continue lipitor '20mg'$  daily      For questions or updates, please contact [place] Please consult [email or web address] for contact info under        Signed, Freada Bergeron, MD  10/14/2021, 8:27 AM

## 2021-10-13 NOTE — Progress Notes (Signed)
Placed patient on home CPAP for th night with nasal pillows. Oxygen set at 3lpm with Sp02=96%. Will continue to monitor patient.

## 2021-10-13 NOTE — Progress Notes (Signed)
Rounding Note    Patient Name: Stacey Page Date of Encounter: 10/13/2021  [place] Cardiologist: Glenetta Hew, MD   Subjective   Feels much better today. Breathing improving. No chest pain.   Net negative 2.47L Wt 347>337lbs BMET pending  Inpatient Medications    Scheduled Meds:  aspirin  81 mg Oral Daily   atorvastatin  20 mg Oral Daily   carvedilol  6.25 mg Oral BID WC   Chlorhexidine Gluconate Cloth  6 each Topical Daily   furosemide  80 mg Intravenous BID   heparin injection (subcutaneous)  5,000 Units Subcutaneous Q8H   hydrALAZINE  50 mg Oral Q8H   hydrocerin   Topical BID   insulin aspart  0-15 Units Subcutaneous TID WC   isosorbide dinitrate  10 mg Oral TID   mouth rinse  15 mL Mouth Rinse 4 times per day   pantoprazole  40 mg Oral Daily   Continuous Infusions:  PRN Meds: ipratropium-albuterol, mouth rinse   Vital Signs    Vitals:   10/12/21 2317 10/13/21 0413 10/13/21 0420 10/13/21 0729  BP: (!) 104/48 (!) 159/59  (!) 122/50  Pulse: (!) 57 61  65  Resp: '18 13  17  '$ Temp: 98.5 F (36.9 C) 98.2 F (36.8 C)  98.8 F (37.1 C)  TempSrc: Oral Oral  Oral  SpO2: 90% 91%  92%  Weight:   (!) 152.9 kg   Height:        Intake/Output Summary (Last 24 hours) at 10/13/2021 0852 Last data filed at 10/13/2021 0630 Gross per 24 hour  Intake 440 ml  Output 3150 ml  Net -2710 ml       10/13/2021    4:20 AM 10/12/2021    4:37 AM 10/11/2021    4:38 PM  Last 3 Weights  Weight (lbs) 337 lb 1.3 oz 347 lb 0.1 oz 353 lb 12.8 oz  Weight (kg) 152.9 kg 157.4 kg 160.483 kg      Telemetry    NSR with runs of NSVT - Personally Reviewed  ECG    No new tracing - Personally Reviewed  Physical Exam   GEN: Laying in bed, comfortable, CPAP in place Neck: JVD difficult to assess due to body habitus Cardiac: RRR, 2/6 systolic murmur.  Respiratory: Clear to auscultation bilaterally. GI: Soft, nontender, non-distended  MS: Trace edema, warm Neuro:   Nonfocal  Psych: Normal affect   Labs    High Sensitivity Troponin:   Recent Labs  Lab 10/09/21 1722 10/09/21 1956 10/10/21 0533  TROPONINIHS 202* 226* 224*      Chemistry Recent Labs  Lab 10/10/21 0533 10/10/21 0744 10/11/21 0009 10/12/21 0022  NA  --  142 143 142  K  --  4.8 4.6 4.6  CL  --  108 107 104  CO2  --  '29 29 31  '$ GLUCOSE  --  128* 95 110*  BUN  --  48* 50* 47*  CREATININE  --  3.24* 3.31* 2.76*  CALCIUM  --  8.8* 8.8* 8.9  MG 2.5*  --   --   --   GFRNONAA  --  15* 15* 18*  ANIONGAP  --  '5 7 7     '$ Lipids No results for input(s): "CHOL", "TRIG", "HDL", "LABVLDL", "LDLCALC", "CHOLHDL" in the last 168 hours.  Hematology Recent Labs  Lab 10/10/21 0533 10/11/21 0009 10/12/21 0022  WBC 5.8 5.2 5.7  RBC 3.79* 3.79* 3.70*  HGB 11.1* 11.3* 11.0*  HCT 37.7  38.2 36.9  MCV 99.5 100.8* 99.7  MCH 29.3 29.8 29.7  MCHC 29.4* 29.6* 29.8*  RDW 16.6* 16.6* 16.0*  PLT 144* 143* 149*    Thyroid No results for input(s): "TSH", "FREET4" in the last 168 hours.  BNP Recent Labs  Lab 10/09/21 1722 10/11/21 0009  BNP 576.7* 478.7*     DDimer No results for input(s): "DDIMER" in the last 168 hours.   Radiology    No results found.  Cardiac Studies   TTE 10/10/21: IMPRESSIONS     1. Left ventricular ejection fraction, by estimation, is 70 to 75%. The  left ventricle has hyperdynamic function. The left ventricle has no  regional wall motion abnormalities. There is moderate left ventricular  hypertrophy. Left ventricular diastolic  parameters are indeterminate.   2. Right ventricular systolic function is normal. The right ventricular  size is normal. There is moderately elevated pulmonary artery systolic  pressure.   3. Left atrial size was moderately dilated.   4. The mitral valve is normal in structure. No evidence of mitral valve  regurgitation. No evidence of mitral stenosis. Moderate mitral annular  calcification.   5. The aortic valve is calcified.  There is mild calcification of the  aortic valve. There is mild thickening of the aortic valve. Aortic valve  regurgitation is not visualized. Moderate aortic valve stenosis. Aortic  valve area, by VTI measures 1.88 cm.  Aortic valve mean gradient measures 23.0 mmHg. Aortic valve Vmax measures  3.12 m/s.   6. The inferior vena cava is dilated in size with <50% respiratory  variability, suggesting right atrial pressure of 15 mmHg.   Comparison(s): Prior aortic valve gradient 18 mmHg.   Patient Profile     71 y.o. female chronic diastolic heart failure, COPD, OSA, HTN, CKD stage IV, obesity, and DMII who presented with shortness of breath found to have acute respiratory failure with hypoxia and hypercarbia. Cardiology was consulted for further management of acute on chronic diastolic HF.   Assessment & Plan    #Acute on Chronic Diastolic HF: Patient presented with worsening SOB found to have acute hypoxic and hypercarbic respiratory failure due to underlying acute on chronic diastolic HF. Likely triggered by medication non-adherence. TTE with LVEF 70-75%, normal RV, moderate pulmonary HTN, moderate AS, RAP 51mHg. Has been diuresed with IV lasix with good response. Volume status difficult to assess given body habitus but appears to be approaching euvolemia. Will follow-up BMET today and will likely plan on one more day IV diuresis and potentially transition to PO lasix tomorrow -Continue lasix '80mg'$  IV BID today; likely PO tomorrow -No farxiga or spiro due to CKD IV -Monitor I/Os and daily weights -Low Na diet  #Acute Respiratory Failure with Hypoxia and Hypercarbia: Due to underlying acute on chronic diastolic HF. Required BiPAP on admission [facility] HFpEF as above.  #Moderate AS: AVA 1.88cm2, mean gradient 273mg, Vmax 3.1253m -Continue serial management as outpatient.  #CKD IV: Cr improving with diuresis. Will trend. -Trend BMET  #HTN: Better controlled today.   -Continue hydralazine '50mg'$  TID -Continue isordil '10mg'$  TID -Continue coreg 6.'25mg'$  BID  #OSA on CPAP: -Continue CPAP  #HLD: -Continue lipitor '20mg'$  daily      For questions or updates, please contact [place] consult [email or web address] for contact info under        Signed, HeaFreada BergeronD  10/13/2021, 8:52 AM

## 2021-10-13 NOTE — Plan of Care (Signed)
  Problem: Education: Goal: Knowledge of General Education information will improve Description: Including pain rating scale, medication(s)/side effects and non-pharmacologic comfort measures Outcome: Progressing   Problem: Health Behavior/Discharge Planning: Goal: Ability to manage health-related needs will improve Outcome: Progressing   Problem: Clinical Measurements: Goal: Ability to maintain clinical measurements within normal limits will improve Outcome: Progressing Goal: Will remain free from infection Outcome: Progressing Goal: Diagnostic test results will improve Outcome: Progressing Goal: Respiratory complications will improve Outcome: Progressing Goal: Cardiovascular complication will be avoided Outcome: Progressing   Problem: Activity: Goal: Risk for activity intolerance will decrease Outcome: Progressing   Problem: Nutrition: Goal: Adequate nutrition will be maintained Outcome: Progressing   Problem: Coping: Goal: Level of anxiety will decrease Outcome: Progressing   Problem: Elimination: Goal: Will not experience complications related to bowel motility Outcome: Progressing Goal: Will not experience complications related to urinary retention Outcome: Progressing   Problem: Pain Managment: Goal: General experience of comfort will improve Outcome: Progressing   Problem: Safety: Goal: Ability to remain free from injury will improve Outcome: Progressing   Problem: Skin Integrity: Goal: Risk for impaired skin integrity will decrease Outcome: Progressing   Problem: Education: Goal: Ability to describe self-care measures that may prevent or decrease complications (Diabetes Survival Skills Education) will improve Outcome: Progressing Goal: Individualized Educational Video(s) Outcome: Progressing   Problem: Coping: Goal: Ability to adjust to condition or change in health will improve Outcome: Progressing   Problem: Fluid Volume: Goal: Ability to  maintain a balanced intake and output will improve Outcome: Progressing   Problem: Health Behavior/Discharge Planning: Goal: Ability to identify and utilize available resources and services will improve Outcome: Progressing Goal: Ability to manage health-related needs will improve Outcome: Progressing   Problem: Metabolic: Goal: Ability to maintain appropriate glucose levels will improve Outcome: Progressing   Problem: Skin Integrity: Goal: Risk for impaired skin integrity will decrease Outcome: Progressing   Problem: Nutritional: Goal: Maintenance of adequate nutrition will improve Outcome: Progressing Goal: Progress toward achieving an optimal weight will improve Outcome: Progressing   Problem: Tissue Perfusion: Goal: Adequacy of tissue perfusion will improve Outcome: Progressing

## 2021-10-14 ENCOUNTER — Inpatient Hospital Stay: Payer: No Typology Code available for payment source | Attending: Adult Health

## 2021-10-14 ENCOUNTER — Telehealth: Payer: Self-pay | Admitting: Pulmonary Disease

## 2021-10-14 DIAGNOSIS — I5033 Acute on chronic diastolic (congestive) heart failure: Secondary | ICD-10-CM | POA: Diagnosis not present

## 2021-10-14 DIAGNOSIS — E1169 Type 2 diabetes mellitus with other specified complication: Secondary | ICD-10-CM | POA: Diagnosis not present

## 2021-10-14 DIAGNOSIS — G4733 Obstructive sleep apnea (adult) (pediatric): Secondary | ICD-10-CM | POA: Diagnosis not present

## 2021-10-14 DIAGNOSIS — N179 Acute kidney failure, unspecified: Secondary | ICD-10-CM | POA: Diagnosis not present

## 2021-10-14 DIAGNOSIS — J9602 Acute respiratory failure with hypercapnia: Secondary | ICD-10-CM | POA: Diagnosis not present

## 2021-10-14 LAB — CBC
HCT: 41.8 % (ref 36.0–46.0)
Hemoglobin: 12.7 g/dL (ref 12.0–15.0)
MCH: 29.3 pg (ref 26.0–34.0)
MCHC: 30.4 g/dL (ref 30.0–36.0)
MCV: 96.5 fL (ref 80.0–100.0)
Platelets: 172 10*3/uL (ref 150–400)
RBC: 4.33 MIL/uL (ref 3.87–5.11)
RDW: 15.4 % (ref 11.5–15.5)
WBC: 3.6 10*3/uL — ABNORMAL LOW (ref 4.0–10.5)
nRBC: 0 % (ref 0.0–0.2)

## 2021-10-14 LAB — MAGNESIUM: Magnesium: 1.7 mg/dL (ref 1.7–2.4)

## 2021-10-14 LAB — BASIC METABOLIC PANEL
Anion gap: 8 (ref 5–15)
BUN: 37 mg/dL — ABNORMAL HIGH (ref 8–23)
CO2: 39 mmol/L — ABNORMAL HIGH (ref 22–32)
Calcium: 9.2 mg/dL (ref 8.9–10.3)
Chloride: 93 mmol/L — ABNORMAL LOW (ref 98–111)
Creatinine, Ser: 2.5 mg/dL — ABNORMAL HIGH (ref 0.44–1.00)
GFR, Estimated: 20 mL/min — ABNORMAL LOW (ref >60)
Glucose, Bld: 151 mg/dL — ABNORMAL HIGH (ref 70–99)
Potassium: 3.4 mmol/L — ABNORMAL LOW (ref 3.5–5.1)
Sodium: 140 mmol/L (ref 135–145)

## 2021-10-14 LAB — IRON AND TIBC
Iron: 41 ug/dL (ref 28–170)
Saturation Ratios: 15 % (ref 10.4–31.8)
TIBC: 279 ug/dL (ref 250–450)
UIBC: 238 ug/dL

## 2021-10-14 LAB — GLUCOSE, CAPILLARY
Glucose-Capillary: 148 mg/dL — ABNORMAL HIGH (ref 70–99)
Glucose-Capillary: 159 mg/dL — ABNORMAL HIGH (ref 70–99)
Glucose-Capillary: 124 mg/dL — ABNORMAL HIGH (ref 70–99)
Glucose-Capillary: 78 mg/dL (ref 70–99)
Glucose-Capillary: 128 mg/dL — ABNORMAL HIGH (ref 70–99)
Glucose-Capillary: 144 mg/dL — ABNORMAL HIGH (ref 70–99)

## 2021-10-14 LAB — FERRITIN: Ferritin: 51 ng/mL (ref 11–307)

## 2021-10-14 MED ORDER — MAGNESIUM SULFATE 2 GM/50ML IV SOLN
2.0000 g | Freq: Once | INTRAVENOUS | Status: AC
  Administered 2021-10-14: 2 g via INTRAVENOUS
  Filled 2021-10-14: qty 50

## 2021-10-14 MED ORDER — FERROUS SULFATE 325 (65 FE) MG PO TABS
325.0000 mg | ORAL_TABLET | Freq: Every day | ORAL | Status: DC
  Administered 2021-10-15 – 2021-10-16 (×2): 325 mg via ORAL
  Filled 2021-10-14 (×2): qty 1

## 2021-10-14 MED ORDER — POTASSIUM CHLORIDE CRYS ER 20 MEQ PO TBCR
40.0000 meq | EXTENDED_RELEASE_TABLET | Freq: Once | ORAL | Status: AC
  Administered 2021-10-14: 40 meq via ORAL
  Filled 2021-10-14: qty 2

## 2021-10-14 NOTE — TOC Initial Note (Signed)
Transition of Care [facility]) - Initial/Assessment Note    Patient Details  Name: Stacey Page MRN: [id number] Date of Birth: Oct 04, 1950  Transition of Care [facility]) CM/SW Contact:    Ninfa Meeker, RN Phone Number: 10/14/2021, 12:30 PM  Clinical Narrative:                 Transition of Care Screening Note:  Transition of Care Department [facility]) has reviewed patient and no TOC needs have been identified at this time. We will continue to monitor patient advancement through Interdisciplinary progressions. If new patient transition needs arise, please place a consult.          Patient Goals and CMS Choice        Expected Discharge Plan and Services                                                Prior Living Arrangements/Services                       Activities of Daily Living      Permission Sought/Granted                  Emotional Assessment              Admission diagnosis:  Respiratory acidosis [E87.29] CHF (congestive heart failure) ([place]) [I50.9] Demand ischemia ([place]) [I24.8] Acute kidney injury ([place]) [N17.9] Patient Active Problem List   Diagnosis Date Noted   Demand ischemia ([place])    Respiratory acidosis    Acute respiratory failure with hypercapnia ([place])    Acute on chronic heart failure with preserved ejection fraction (H[facility])    CHF (congestive heart failure) ([place]) 10/09/2021   Reactive airway disease 09/21/2021   Viral syndrome 09/21/2021   Hypertensive retinopathy of right eye, grade 1 08/11/2021   Grade 2 hypertensive retinopathy, left 08/11/2021   Nuclear sclerotic cataract of both eyes 08/11/2021   Need for shingles vaccine 07/14/2021   Iron deficiency anemia 01/13/2021   CKD (chronic kidney disease) stage 4, GFR 15-29 ml/min ([place]) 07/09/2019   Diabetes mellitus type 2 in obese ([place]) 07/02/2019   Polyneuropathy associated with underlying disease ([place]) 11/28/2018   Severe episode of recurrent major depressive  disorder, without psychotic features ([place]) 11/28/2018   Type 2 diabetes mellitus with diabetic peripheral angiopathy without gangrene, without long-term current use of insulin ([place]) 11/28/2018   Poor short term memory 11/28/2018   Moderate aortic stenosis by prior echocardiogram 11/06/2018   Diabetes mellitus ([employer]) 03/21/2017   Acute renal failure superimposed on stage 2 chronic kidney disease ([place]) 03/17/2016   Symptomatic anemia 03/17/2016   S/P total knee arthroplasty, right 03/17/2016   Unilateral primary osteoarthritis, right knee    Hyperlipidemia associated with type 2 diabetes mellitus ([place]) 12/15/2015   Pain in both lower extremities 11/19/2015   Numbness in both hands 08/13/2015   Panic attack 06/09/2015   Right knee pain 05/14/2015   Chronic renal insufficiency 09/16/2014   Pneumothorax on right    Atypical mycobacterium infection    ILD (interstitial lung disease) 2/2 MAI s/p med rxn    Class 3 severe obesity with serious comorbidity and body mass index (BMI) of 50.0 to 59.9 in adult ([place]) 04/03/2014   OSA (obstructive sleep apnea) 02/25/2014   Hypertensive heart disease with chronic diastolic congestive heart failure ([place]) 02/25/2014  DOE (dyspnea on exertion) 02/25/2014   Essential hypertension 02/25/2014   PCP:  [name], MD Pharmacy:   [facility] - [place], [place] - [address] [place] [address] Phone: [phone] Fax: [phone]     Social Determinants of Health (SDOH) Interventions    Readmission Risk Interventions     No data to display

## 2021-10-14 NOTE — Telephone Encounter (Signed)
PCCM:  Needs appt with Dr. Halford Chessman or APP in 2-3 weeks s/p discharge   Thanks  [place], DO [facility] 10/14/2021 3:26 PM

## 2021-10-14 NOTE — Progress Notes (Signed)
Pt found on home CPAP with 6L bleed in. PT tolerating well. Will continue to monitor.

## 2021-10-14 NOTE — Progress Notes (Signed)
Mobility Specialist Progress Note    10/14/21 1217  Mobility  Activity Ambulated with assistance in hallway  Level of Assistance Contact guard assist, steadying assist  Assistive Device Other (Comment) (hallway railings)  Distance Ambulated (ft) 70 ft  Activity Response Tolerated fair  $Mobility charge 1 Mobility   Pt received sitting EOB and agreeable. No complaints. SpO2 85-89% on 2LO2. RN advised leave on 3LO2 in room. Left sitting EOB with call bell in reach.   Hildred Alamin Mobility Specialist

## 2021-10-14 NOTE — Progress Notes (Signed)
PROGRESS NOTE    Lineth Thielke  IYM:[id number] DOB: 1950-06-08 DOA: 10/09/2021 PCP: [name], MD   Brief Narrative: 71 year old with past medical history significant for heart failure, aortic stenosis, hypertension, hyperlipidemia, OSA, obesity, COPD, CKD stage IV and diabetes type 2 presents complaining of shortness of breath, found to be in acute hypoxic, hypercapnic respiratory failure with mental status changes.  Patient was also found to be in acute heart failure exacerbation.  Initially placed on BiPAP with improvement in mental status.  Managed with IV Lasix.  Cardiology assisting with medical management.     Assessment & Plan:   Principal Problem:   Acute respiratory failure with hypercapnia (HCC) Active Problems:   OSA (obstructive sleep apnea)   Hypertensive heart disease with chronic diastolic congestive heart failure (HCC)   Class 3 severe obesity with serious comorbidity and body mass index (BMI) of 50.0 to 59.9 in adult ([place])   ILD (interstitial lung disease) 2/2 MAI s/p med rxn   Hyperlipidemia associated with type 2 diabetes mellitus ([place])   Acute renal failure superimposed on stage 2 chronic kidney disease (HCC)   Moderate aortic stenosis by prior echocardiogram   Acute on chronic heart failure with preserved ejection fraction (HCC)   Demand ischemia (HCC)   Respiratory acidosis  1-Acute Hypoxic, Hypercapnic respiratory failure Initially required BiPAP. Using CPAP Venous gas PCO2 at 71. Patient refuse BIPAP last night.  Wean off of oxygen as tolerated Continue with IV Lasix In the setting of acute on chronic diastolic heart failure exacerbation, history of COPD.  Pulmonologist consulted, Does patient needs BIPAP at discharge ? She needs close follow up with pulmonologist as well.    2-Acute on chronic diastolic heart failure exacerbation: Secondary to medication noncompliance. Continue with IV Lasix Holding Coreg due to bradycardia -7  L Weight: 360---337. Appreciate cardiology assistance Weight pending for today.   AKI on CKD stage IV;  Creatinine baseline 1.8--2 Presents with Cr 3.3 Monitor on lasix.  Mildly increase cr today.   Elevated Troponin; 202---225---224.  Primary HTN;  Continue with hydralazine, Imdur.  Holder parameter for Hypotension.   COPD;  Continue with duoneb  OSA: Continue with CPAP Aortic stenosis: Moderate stenosis. Cardiology following.   Hyperlipidemia: Continue Lipitor  Normocytic  anemia:  hemoglobin stable  Diabetes type 2: Continue with sliding scale  Hypomagnesemia; Mg increase 1.6.. replete IV again.  Hypokalemia; replete orally.   Iron deficiency;  Iron 41.  Hb at 12.   Estimated body mass index is 51.25 kg/m as calculated from the following:   Height as of this encounter: '5\' 8"'$  (1.727 m).   Weight as of this encounter: 152.9 kg.   DVT prophylaxis: Heparin Code Status: Full code Family Communication: Care discussed with patient Disposition Plan:  Status is: Inpatient Remains inpatient appropriate because: Continue with IV diuresis    Consultants:  Cardiology   Procedures:  ECHO  Antimicrobials:    Subjective: She is alert, breathing better, she is aware of everything today.   Objective: Vitals:   10/14/21 0513 10/14/21 0747 10/14/21 0959 10/14/21 1128  BP: (!) 141/56 (!) 116/37 117/67 119/67  Pulse: (!) 59 61  67  Resp: 14   19  Temp: 97.9 F (36.6 C) 98 F (36.7 C)  97.7 F (36.5 C)  TempSrc: Oral Oral  Oral  SpO2: 94% 92%  93%  Weight:      Height:        Intake/Output Summary (Last 24 hours) at 10/14/2021 1405 Last data  filed at 10/14/2021 1400 Gross per 24 hour  Intake 480 ml  Output 3300 ml  Net -2820 ml    Filed Weights   10/11/21 1638 10/12/21 0437 10/13/21 0420  Weight: (!) 160.5 kg (!) 157.4 kg (!) 152.9 kg    Examination:  General exam: NAD Respiratory system: Crackles, BL Cardiovascular system: S 1, S 2  RRR Gastrointestinal system: BS present, soft, nt Central nervous system: alert, oriented.  Extremities: no edema  Data Reviewed: I have personally reviewed following labs and imaging studies  CBC: Recent Labs  Lab 10/10/21 0533 10/11/21 0009 10/12/21 0022 10/13/21 0852 10/14/21 0916  WBC 5.8 5.2 5.7 4.2 3.6*  HGB 11.1* 11.3* 11.0* 11.9* 12.7  HCT 37.7 38.2 36.9 39.5 41.8  MCV 99.5 100.8* 99.7 97.5 96.5  PLT 144* 143* 149* 153 790    Basic Metabolic Panel: Recent Labs  Lab 10/10/21 0533 10/10/21 0744 10/11/21 0009 10/12/21 0022 10/13/21 0852 10/13/21 0922 10/14/21 0916  NA  --  142 143 142 142  --  140  K  --  4.8 4.6 4.6 4.0  --  3.4*  CL  --  108 107 104 95*  --  93*  CO2  --  '29 29 31 '$ 38*  --  39*  GLUCOSE  --  128* 95 110* 183*  --  151*  BUN  --  48* 50* 47* 36*  --  37*  CREATININE  --  3.24* 3.31* 2.76* 2.20*  --  2.50*  CALCIUM  --  8.8* 8.8* 8.9 9.5  --  9.2  MG 2.5*  --   --   --   --  1.6* 1.7    GFR: Estimated Creatinine Clearance: 33.4 mL/min (A) (by C-G formula based on SCr of 2.5 mg/dL (H)). Liver Function Tests: No results for input(s): "AST", "ALT", "ALKPHOS", "BILITOT", "PROT", "ALBUMIN" in the last 168 hours. No results for input(s): "LIPASE", "AMYLASE" in the last 168 hours. No results for input(s): "AMMONIA" in the last 168 hours. Coagulation Profile: No results for input(s): "INR", "PROTIME" in the last 168 hours. Cardiac Enzymes: No results for input(s): "CKTOTAL", "CKMB", "CKMBINDEX", "TROPONINI" in the last 168 hours. BNP (last 3 results) No results for input(s): "PROBNP" in the last 8760 hours. HbA1C: No results for input(s): "HGBA1C" in the last 72 hours. CBG: Recent Labs  Lab 10/13/21 1704 10/13/21 1810 10/13/21 2136 10/14/21 0617 10/14/21 1128  GLUCAP 148* 159* 152* 124* 78    Lipid Profile: No results for input(s): "CHOL", "HDL", "LDLCALC", "TRIG", "CHOLHDL", "LDLDIRECT" in the last 72 hours. Thyroid Function  Tests: No results for input(s): "TSH", "T4TOTAL", "FREET4", "T3FREE", "THYROIDAB" in the last 72 hours. Anemia Panel: Recent Labs    10/14/21 0916  FERRITIN 51  TIBC 279  IRON 41   Sepsis Labs: Recent Labs  Lab 10/09/21 1956 10/10/21 0744 10/11/21 0009  PROCALCITON <0.10 <0.10 <0.10  LATICACIDVEN 0.7  0.8  --   --      Recent Results (from the past 240 hour(s))  Resp Panel by RT-PCR (Flu A&B, Covid) Anterior Nasal Swab     Status: None   Collection Time: 10/09/21  5:22 PM   Specimen: Anterior Nasal Swab  Result Value Ref Range Status   SARS Coronavirus 2 by RT PCR NEGATIVE NEGATIVE Final    Comment: (NOTE) SARS-CoV-2 target nucleic acids are NOT DETECTED.  The SARS-CoV-2 RNA is generally detectable in upper respiratory specimens during the acute phase of infection. The lowest concentration of SARS-CoV-2  viral copies this assay can detect is 138 copies/mL. A negative result does not preclude SARS-Cov-2 infection and should not be used as the sole basis for treatment or other patient management decisions. A negative result may occur with  improper specimen collection/handling, submission of specimen other than nasopharyngeal swab, presence of viral mutation(s) within the areas targeted by this assay, and inadequate number of viral copies(<138 copies/mL). A negative result must be combined with clinical observations, patient history, and epidemiological information. The expected result is Negative.  Fact Sheet for Patients:  EntrepreneurPulse.com.au  Fact Sheet for Healthcare Providers:  IncredibleEmployment.be  This test is no t yet approved or cleared by the [place] FDA and  has been authorized for detection and/or diagnosis of SARS-CoV-2 by FDA under an Emergency Use Authorization (EUA). This EUA will remain  in effect (meaning this test can be used) for the duration of the COVID-19 declaration under Section 564(b)(1) of  the Act, 21 U.S.C.section 360bbb-3(b)(1), unless the authorization is terminated  or revoked sooner.       Influenza A by PCR NEGATIVE NEGATIVE Final   Influenza B by PCR NEGATIVE NEGATIVE Final    Comment: (NOTE) The Xpert Xpress SARS-CoV-2/FLU/RSV plus assay is intended as an aid in the diagnosis of influenza from Nasopharyngeal swab specimens and should not be used as a sole basis for treatment. Nasal washings and aspirates are unacceptable for Xpert Xpress SARS-CoV-2/FLU/RSV testing.  Fact Sheet for Patients: EntrepreneurPulse.com.au  Fact Sheet for Healthcare Providers: IncredibleEmployment.be  This test is not yet approved or cleared by the [place] FDA and has been authorized for detection and/or diagnosis of SARS-CoV-2 by FDA under an Emergency Use Authorization (EUA). This EUA will remain in effect (meaning this test can be used) for the duration of the COVID-19 declaration under Section 564(b)(1) of the Act, 21 U.S.C. section 360bbb-3(b)(1), unless the authorization is terminated or revoked.  Performed at [facility], [address] [place], [place] [address]   MRSA Next Gen by PCR, Nasal     Status: None   Collection Time: 10/10/21 12:52 AM   Specimen: Nasal Mucosa; Nasal Swab  Result Value Ref Range Status   MRSA by PCR Next Gen NOT DETECTED NOT DETECTED Final    Comment: (NOTE) The GeneXpert MRSA Assay (FDA approved for NASAL specimens only), is one component of a comprehensive MRSA colonization surveillance program. It is not intended to diagnose MRSA infection nor to guide or monitor treatment for MRSA infections. Test performance is not FDA approved in patients less than 58 years old. Performed at [facility], [place] [address], [place], [place] [address]          Radiology Studies: No results found.      Scheduled Meds:  aspirin  81 mg Oral Daily   atorvastatin  20 mg Oral  Daily   carvedilol  6.25 mg Oral BID WC   furosemide  80 mg Intravenous BID   heparin injection (subcutaneous)  5,000 Units Subcutaneous Q8H   hydrALAZINE  50 mg Oral Q8H   hydrocerin   Topical BID   insulin aspart  0-15 Units Subcutaneous TID WC   isosorbide dinitrate  10 mg Oral TID   mouth rinse  15 mL Mouth Rinse 4 times per day   pantoprazole  40 mg Oral Daily   Continuous Infusions:   LOS: 4 days    Time spent: 35 minutes.    Elmarie Shiley, MD [facility]   If 7PM-7AM,  please contact night-coverage [email or web address]  10/14/2021, 2:05 PM

## 2021-10-14 NOTE — Consult Note (Signed)
NAME:  Stacey Page, MRN:  [id number], DOB:  July 21, 1950, LOS: 4 ADMISSION DATE:  10/09/2021, CONSULTATION DATE:  9/7 REFERRING MD:  Tyrell Antonio, CHIEF COMPLAINT:  SOB   History of Present Illness:  Stacey Page is a 71 yo female with diastolic heart failure, ILD, T2DM, CKD IV, hypertension, and OSA on CPAP who prsented with shortness of breath. Reportedly, the patient missed some doses ofher lasix and noticed an increase in her lower extremity edema, which prompted her to go to the ED. In the ED, the patient was hypoxic and hypotensive and she was initially admitted to ICU for acute hypoxic respiratory failure secondary to acute on chronic diastolic heart failure. Patient was transferred out to hospitalist service on 9/4 as she was weaned off BiPAP to [place].   Patient has been diuresing well and reports an improvement in her breathing and in her LE edema. She remains on 5 L [place] and is not on any supplemental O2 at home.    Pertinent  Medical History  OSA on CPAP, diastolic CHF, S9QZ, CKD IV, hypertension, obesity, allergic asthma, aortic stenosis   Significant Hospital Events: Including procedures, antibiotic start and stop dates in addition to other pertinent events   9/2 admitted to ICU on BiPAP 9/4 transferred to hospitalist service  9/7 pulm consult for acute hypoxic respiratory failure/OSA  Interim History / Subjective:  Patient states that she feels fine this morning. She is not able to lay flat due to orthopnea, but denies any SOB at rest or while conversing. No chest pain.   Objective   Blood pressure 117/67, pulse 61, temperature 98 F (36.7 C), temperature source Oral, resp. rate 14, height 5' 8" (1.727 m), weight (!) 152.9 kg, SpO2 92 %.        Intake/Output Summary (Last 24 hours) at 10/14/2021 1006 Last data filed at 10/14/2021 0800 Gross per 24 hour  Intake 240 ml  Output 2700 ml  Net -2460 ml   Filed Weights   10/11/21 1638 10/12/21 0437 10/13/21 0420  Weight: (!)  160.5 kg (!) 157.4 kg (!) 152.9 kg    Examination: General: Sitting up in bed, NAD HENT: [place]/AT, eyes anicteric Lungs: Diminished breath sounds at lung bases, no audible crackles/wheezes  Cardiovascular: Regular rate, rhythm, +systolic murmur Abdomen: Soft, nontender, nondistended, +BS Extremities: Warm, dry. Trace lower extremity edema Neuro: A&Ox4, no focal deficit  Resolved Hospital Problem list     Assessment & Plan:  Acute hypoxic and hypercapnic respiratory failure secondary to acute on chronic diastolic CHF Patient not on any supplemental O2 at home, on 5 L [place] currently and satting >90%. She is being diuresed with IV lasix 80 mg bid and is net -7.6 L this admission. Volume status difficult to assess on xam secondary to body habitus.   - Continue IV lasix 80 mg bid - Strict I&Os and daily weights - Low salt diet  - Duoneb q4h prn  - Wean [place] as able; maintain SpO2 >90% - PT eval and treat  OSA on CPAP - CPAP qhs  - Will need outpatient follow up with Dr. Halford Chessman (may need repeat sleep study d/t weight changes)  AKI on CKD IV Likely secondary to cardiorenal syndrome, Cr improving with diuresis. - Daily BMP - Avoid nephrotoxins  Hypertension BP well controlled on hydralazine, isordil, and coreg.   Best Practice (right click and "Reselect all SmartList Selections" daily)   Diet/type: Regular consistency (see orders) DVT prophylaxis: prophylactic heparin  GI prophylaxis: PPI Lines: N/A Foley:  N/A Code Status:  full code Last date of multidisciplinary goals of care discussion [updated patient and daughter at bedside 9/7]  Labs   CBC: Recent Labs  Lab 10/09/21 1722 10/09/21 2001 10/10/21 0533 10/11/21 0009 10/12/21 0022 10/13/21 0852  WBC 5.9  --  5.8 5.2 5.7 4.2  HGB 11.8* 12.6 11.1* 11.3* 11.0* 11.9*  HCT 39.9 37.0 37.7 38.2 36.9 39.5  MCV 99.8  --  99.5 100.8* 99.7 97.5  PLT 160  --  144* 143* 149* 287    Basic Metabolic Panel: Recent Labs  Lab  10/09/21 1722 10/09/21 2001 10/10/21 0533 10/10/21 0744 10/11/21 0009 10/12/21 0022 10/13/21 0852 10/13/21 0922  NA 141 141  --  142 143 142 142  --   K 4.8 4.9  --  4.8 4.6 4.6 4.0  --   CL 105  --   --  108 107 104 95*  --   CO2 30  --   --  _0 38*  --   GLUCOSE 125*  --   --  128* 95 110* 183*  --   BUN 50*  --   --  48* 50* 47* 36*  --   CREATININE 3.35*  --   --  3.24* 3.31* 2.76* 2.20*  --   CALCIUM 9.7  --   --  8.8* 8.8* 8.9 9.5  --   MG  --   --  2.5*  --   --   --   --  1.6*   GFR: Estimated Creatinine Clearance: 37.9 mL/min (A) (by C-G formula based on SCr of 2.2 mg/dL (H)). Recent Labs  Lab 10/09/21 1956 10/10/21 0533 10/10/21 0744 10/11/21 0009 10/12/21 0022 10/13/21 0852  PROCALCITON <0.10  --  <0.10 <0.10  --   --   WBC  --  5.8  --  5.2 5.7 4.2  LATICACIDVEN 0.7  0.8  --   --   --   --   --     Liver Function Tests: No results for input(s): "AST", "ALT", "ALKPHOS", "BILITOT", "PROT", "ALBUMIN" in the last 168 hours. No results for input(s): "LIPASE", "AMYLASE" in the last 168 hours. No results for input(s): "AMMONIA" in the last 168 hours.  ABG    Component Value Date/Time   PHART 7.291 (L) 06/11/2014 0500   PCO2ART 52.4 (H) 06/11/2014 0500   PO2ART 60.9 (L) 06/11/2014 0500   HCO3 47.1 (H) 10/13/2021 1453   TCO2 34 (H) 10/09/2021 2001   ACIDBASEDEF 1.2 06/11/2014 0500   O2SAT 68.8 10/13/2021 1453     Coagulation Profile: No results for input(s): "INR", "PROTIME" in the last 168 hours.  Cardiac Enzymes: No results for input(s): "CKTOTAL", "CKMB", "CKMBINDEX", "TROPONINI" in the last 168 hours.  HbA1C: Hemoglobin A1C  Date/Time Value Ref Range Status  07/14/2021 11:25 AM 6.0 (A) 4.0 - 5.6 % Final  04/14/2021 08:56 AM 6.4 (A) 4.0 - 5.6 % Final   Hgb A1c MFr Bld  Date/Time Value Ref Range Status  10/14/2020 10:57 AM 6.1 (H) 4.8 - 5.6 % Final    Comment:             Prediabetes: 5.7 - 6.4          Diabetes: >6.4          Glycemic  control for adults with diabetes: <7.0   05/14/2020 09:10 AM 6.1 4.6 - 6.5 % Final    Comment:    Glycemic Control Guidelines for People with Diabetes:Non  Diabetic:  <6%Goal of Therapy: <7%Additional Action Suggested:  >8%     CBG: Recent Labs  Lab 10/13/21 1149 10/13/21 1704 10/13/21 1810 10/13/21 2136 10/14/21 0617  GLUCAP 126* 148* 159* 152* 124*     Past Medical History:  She,  has a past medical history of Anxiety, Arthritis of right knee (03/14/2016), Asthma, Back pain, Chronic diastolic CHF (congestive heart failure) ([place]), Depression, Diabetes ([place]), Eczema, GERD (gastroesophageal reflux disease), History of bronchitis as a child, HTN (hypertension), Insomnia, Mild aortic stenosis by prior echocardiogram (04/2017), Mycobacterium avium-intracellulare infection ([place]) (2016), OA (osteoarthritis), Obese, OSA (obstructive sleep apnea) (02/25/2014), Peripheral neuropathy, Pneumonia, Seasonal allergies, and Sleep apnea.   Surgical History:   Past Surgical History:  Procedure Laterality Date   BREAST BIOPSY Right 03/2018   BREAST LUMPECTOMY WITH RADIOACTIVE SEED LOCALIZATION Right 12/28/2018   Procedure: RIGHT BREAST LUMPECTOMY WITH RADIOACTIVE SEED LOCALIZATION;  Surgeon: Jovita Kussmaul, MD;  Location: [facility];  Service: General;  Laterality: Right;   BREAST SURGERY     CARDIOPULMONARY EXERCISE TEST (CPX)  06/21/2021   (Performed on Coreg 25 mg twice daily): PFTs: FVC 1.76 (60%), FEV1 1.59 (77%), ratio 114% => RESTRICTIVE PHYSIOLOGY; 5 min- 1 mph - 2% grade. 7/10 dyspnea -> pulse ox range during exercise 95 to 98%, low of 90%, 97% on recovery; BP 106/84-160/64; heart rate 64-90 bpm (60% MPHR) -> Chronotropic Incompetence;;MAIN FACTOR for Exercise Limitation = BODY HABITUS w/ Chronotropic Incompetence.   CESAREAN SECTION  x2   COLONOSCOPY     CORONARY CALCIUM SCORE & CT ANGIOGRAM  09/2017   Coronary Ca Score = 11 (low).  CTA- no obstructive CAD (minimal disease)   JOINT REPLACEMENT      KNEE ARTHROSCOPY Right    LUNG BIOPSY Right 06/10/2014   Procedure: LUNG BIOPSY;  Surgeon: Ivin Poot, MD;  Location: [facility];  Service: Thoracic;  Laterality: Right;   POLYPECTOMY     throat   TOTAL KNEE ARTHROPLASTY Right 03/14/2016   Procedure: RIGHT TOTAL KNEE ARTHROPLASTY;  Surgeon: Marybelle Killings, MD;  Location: [facility];  Service: Orthopedics;  Laterality: Right;   TRANSTHORACIC ECHOCARDIOGRAM  11/04/2019   EF 60-65%.  Moderate LVH.  GRII DD.  Mild LV dilation.  Mild LA dilation.  Thickened-calcified aortic valve-Mild AS (mean gradient 13 mmHg, peak 29 mmHg--similar to 2019)   TRANSTHORACIC ECHOCARDIOGRAM  06/14/2021   EF 60 to 65%.  No RWMA.  Moderate cLVH with GR 1 DD.  Mild LA dilation.  Mild to moderate aortic calcification/stenosis.  Mean AVG 18 mmHg.   VIDEO ASSISTED THORACOSCOPY Right 06/10/2014   Procedure: VIDEO ASSISTED THORACOSCOPY;  Surgeon: Ivin Poot, MD;  Location[facility];  Service: Thoracic;  Laterality: Right;     Social History:   reports that she quit smoking about 41 years ago. Her smoking use included cigarettes. She has a 3.75 pack-year smoking history. She has quit using smokeless tobacco. She reports current alcohol use. She reports that she does not currently use drugs.   Family History:  Her family history includes Alcoholism in her father; Anemia in her father; COPD in her father; Cancer (age of onset: 59) in her mother; High Cholesterol in her mother; High blood pressure in her father and mother; Hypothyroidism in her father. There is no history of Breast cancer.   Allergies No Known Allergies   Home Medications  Prior to Admission medications   Medication Sig Start Date End Date Taking? Authorizing Provider  albuterol (PROAIR HFA) 108 (90  Base) MCG/ACT inhaler Inhale 1-2 puffs into the lungs every 6 (six) hours as needed for wheezing or shortness of breath. 06/04/21  Yes [name], [name], NP  aspirin 81 MG tablet Take 1 tablet (81 mg total) by  mouth daily with breakfast. 07/15/16  Yes [name], [name], NP  atorvastatin (LIPITOR) 20 MG tablet TAKE 1 TABLET BY MOUTH  DAILY AT 6 PM. Patient taking differently: Take by mouth daily.  DAILY AT 6 PM. 02/16/21  Yes [name], [name], NP  carvedilol (COREG) 25 MG tablet Take 1 tablet (25 mg total) by mouth 2 (two) times daily. 02/16/21  Yes Leonie Man, MD  cholecalciferol (VITAMIN D3) 25 MCG (1000 UNIT) tablet Take 2,000 Units by mouth daily.   Yes [provider]  Continuous Blood Gluc Sensor (FREESTYLE LIBRE 14 DAY SENSOR) MISC Place one sensor on the skin every 14 days to monitor blood sugars continuously as directed. 08/26/21  Yes [name], MD  Docusate Sodium 100 MG capsule Take 100 mg by mouth 2 (two) times daily.   Yes [provider]  ferrous sulfate 325 (65 FE) MG tablet Take 325 mg by mouth 2 (two) times daily with a meal.   Yes [provider]  fluticasone (FLONASE) 50 MCG/ACT nasal spray instill ONE SPRAY in each nostril daily Patient taking differently: Place 1 spray into both nostrils daily. 09/27/21  Yes [name], [name], NP  furosemide (LASIX) 40 MG tablet TAKE 1 TAB FOUR TIMES PER WEEK, TAKE AN ADDITIONAL TAB FOR WEIGHT GAIN OF 2 LBS OVERNIGHT OR 5 LBS IN ONE WEEK Patient taking differently: Take 40 mg by mouth See admin instructions. TAKE 1 TAB FOUR TIMES PER WEEK, TAKE AN ADDITIONAL TAB FOR WEIGHT GAIN OF 2 LBS OVERNIGHT OR 5 LBS IN ONE WEEK 02/16/21  Yes Leonie Man, MD  gabapentin (NEURONTIN) 100 MG capsule Take 1 capsule (100 mg total) by mouth 2 (two) times daily. 06/22/21  Yes [name], [name], NP  hydrALAZINE (APRESOLINE) 50 MG tablet Take 2 tablets (100 mg total) by mouth 3 (three) times daily. 08/06/21  Yes Leonie Man, MD  isosorbide dinitrate (ISORDIL) 20 MG tablet Take 2 tablet ( 40 mg total ) by mouth in morning and evening with Hydralazine 100 mg  dose 08/06/21  Yes Leonie Man, MD  JANUVIA 25 MG tablet  TAKE ONE TABLET BY MOUTH ONCE DAILY Patient taking differently: Take 25 mg by mouth daily. 05/19/21  Yes [name], [name], NP  lisinopril (ZESTRIL) 20 MG tablet TAKE 1 TABLET BY MOUTH ONCE DAILY WITH BREAKFAST Patient taking differently: Take 20 mg by mouth daily. 02/16/21  Yes [name], [name], NP  montelukast (SINGULAIR) 10 MG tablet Take 1 tablet (10 mg total) by mouth at bedtime. 02/16/21  Yes [name], [name], NP  Blood Glucose Monitoring Suppl (Circle) w/Device KIT 1 kit by Does not apply route daily as needed. 07/22/21   [name], MD  glucose blood [facility] VERIO) test strip Use to monitor blood sugar twice daily as instructed 09/20/21   [name], MD  OneTouch Delica Lancets 16B MISC Use to monitor blood sugars once daily as directed 08/05/21   [name], MD     Critical care time: 25 mins

## 2021-10-15 ENCOUNTER — Encounter (HOSPITAL_COMMUNITY)
Admission: EM | Disposition: A | Discharge status: Home Health | Payer: Self-pay | Source: Home / Self Care | Attending: Internal Medicine

## 2021-10-15 DIAGNOSIS — G4733 Obstructive sleep apnea (adult) (pediatric): Secondary | ICD-10-CM | POA: Diagnosis not present

## 2021-10-15 DIAGNOSIS — I5033 Acute on chronic diastolic (congestive) heart failure: Secondary | ICD-10-CM | POA: Diagnosis not present

## 2021-10-15 DIAGNOSIS — I5031 Acute diastolic (congestive) heart failure: Secondary | ICD-10-CM | POA: Diagnosis not present

## 2021-10-15 DIAGNOSIS — J9602 Acute respiratory failure with hypercapnia: Secondary | ICD-10-CM | POA: Diagnosis not present

## 2021-10-15 DIAGNOSIS — N179 Acute kidney failure, unspecified: Secondary | ICD-10-CM | POA: Diagnosis not present

## 2021-10-15 DIAGNOSIS — I350 Nonrheumatic aortic (valve) stenosis: Secondary | ICD-10-CM | POA: Diagnosis not present

## 2021-10-15 HISTORY — PX: RIGHT HEART CATH: CATH118263

## 2021-10-15 LAB — CBC
HCT: 41.3 % (ref 36.0–46.0)
Hemoglobin: 12.5 g/dL (ref 12.0–15.0)
MCH: 28.9 pg (ref 26.0–34.0)
MCHC: 30.3 g/dL (ref 30.0–36.0)
MCV: 95.4 fL (ref 80.0–100.0)
Platelets: 171 10*3/uL (ref 150–400)
RBC: 4.33 MIL/uL (ref 3.87–5.11)
RDW: 15.4 % (ref 11.5–15.5)
WBC: 3.1 10*3/uL — ABNORMAL LOW (ref 4.0–10.5)
nRBC: 0 % (ref 0.0–0.2)

## 2021-10-15 LAB — GLUCOSE, CAPILLARY
Glucose-Capillary: 124 mg/dL — ABNORMAL HIGH (ref 70–99)
Glucose-Capillary: 116 mg/dL — ABNORMAL HIGH (ref 70–99)
Glucose-Capillary: 153 mg/dL — ABNORMAL HIGH (ref 70–99)
Glucose-Capillary: 137 mg/dL — ABNORMAL HIGH (ref 70–99)

## 2021-10-15 LAB — POCT I-STAT EG7
Acid-Base Excess: 16 mmol/L — ABNORMAL HIGH (ref 0.0–2.0)
Acid-Base Excess: 17 mmol/L — ABNORMAL HIGH (ref 0.0–2.0)
Bicarbonate: 43.1 mmol/L — ABNORMAL HIGH (ref 20.0–28.0)
Bicarbonate: 44.4 mmol/L — ABNORMAL HIGH (ref 20.0–28.0)
Calcium, Ion: 1.21 mmol/L (ref 1.15–1.40)
Calcium, Ion: 1.21 mmol/L (ref 1.15–1.40)
HCT: 39 % (ref 36.0–46.0)
HCT: 39 % (ref 36.0–46.0)
Hemoglobin: 13.3 g/dL (ref 12.0–15.0)
Hemoglobin: 13.3 g/dL (ref 12.0–15.0)
O2 Saturation: 56 %
O2 Saturation: 58 %
Potassium: 3.8 mmol/L (ref 3.5–5.1)
Potassium: 3.8 mmol/L (ref 3.5–5.1)
Sodium: 139 mmol/L (ref 135–145)
Sodium: 140 mmol/L (ref 135–145)
TCO2: 45 mmol/L — ABNORMAL HIGH (ref 22–32)
TCO2: 46 mmol/L — ABNORMAL HIGH (ref 22–32)
pCO2, Ven: 63.5 mmHg — ABNORMAL HIGH (ref 44–60)
pCO2, Ven: 63.9 mmHg — ABNORMAL HIGH (ref 44–60)
pH, Ven: 7.439 — ABNORMAL HIGH (ref 7.25–7.43)
pH, Ven: 7.45 — ABNORMAL HIGH (ref 7.25–7.43)
pO2, Ven: 30 mmHg — CL (ref 32–45)
pO2, Ven: 31 mmHg — CL (ref 32–45)

## 2021-10-15 LAB — BASIC METABOLIC PANEL
Anion gap: 11 (ref 5–15)
BUN: 38 mg/dL — ABNORMAL HIGH (ref 8–23)
CO2: 38 mmol/L — ABNORMAL HIGH (ref 22–32)
Calcium: 9.7 mg/dL (ref 8.9–10.3)
Chloride: 92 mmol/L — ABNORMAL LOW (ref 98–111)
Creatinine, Ser: 2.33 mg/dL — ABNORMAL HIGH (ref 0.44–1.00)
GFR, Estimated: 22 mL/min — ABNORMAL LOW (ref >60)
Glucose, Bld: 151 mg/dL — ABNORMAL HIGH (ref 70–99)
Potassium: 3.8 mmol/L (ref 3.5–5.1)
Sodium: 141 mmol/L (ref 135–145)

## 2021-10-15 SURGERY — RIGHT HEART CATH

## 2021-10-15 MED ORDER — LABETALOL HCL 5 MG/ML IV SOLN: 10.0000 mg | INTRAVENOUS | Status: AC | PRN

## 2021-10-15 MED ORDER — FUROSEMIDE 40 MG PO TABS
80.0000 mg | ORAL_TABLET | Freq: Every day | ORAL | Status: DC
  Administered 2021-10-15 – 2021-10-16 (×2): 80 mg via ORAL
  Filled 2021-10-15 (×2): qty 2

## 2021-10-15 MED ORDER — SODIUM CHLORIDE 0.9% FLUSH
3.0000 mL | INTRAVENOUS | Status: DC | PRN
Start: 2021-10-15 — End: 2021-10-16

## 2021-10-15 MED ORDER — SODIUM CHLORIDE 0.9% FLUSH
3.0000 mL | Freq: Two times a day (BID) | INTRAVENOUS | Status: DC
  Administered 2021-10-15 – 2021-10-16 (×2): 3 mL via INTRAVENOUS

## 2021-10-15 MED ORDER — HEPARIN (PORCINE) IN NACL 1000-0.9 UT/500ML-% IV SOLN
INTRAVENOUS | Status: AC
  Filled 2021-10-15: qty 500

## 2021-10-15 MED ORDER — HYDRALAZINE HCL 20 MG/ML IJ SOLN: 10.0000 mg | INTRAMUSCULAR | Status: AC | PRN

## 2021-10-15 MED ORDER — SODIUM CHLORIDE 0.9 % IV SOLN: 250.0000 mL | INTRAVENOUS | Status: DC | PRN

## 2021-10-15 MED ORDER — SODIUM CHLORIDE 0.9 % IV SOLN
250.0000 mL | INTRAVENOUS | Status: DC | PRN
Start: 2021-10-15 — End: 2021-10-16

## 2021-10-15 MED ORDER — SODIUM CHLORIDE 0.9 % IV SOLN: INTRAVENOUS | Status: AC

## 2021-10-15 MED ORDER — ONDANSETRON HCL 4 MG/2ML IJ SOLN: 4.0000 mg | Freq: Four times a day (QID) | INTRAMUSCULAR | Status: DC | PRN

## 2021-10-15 MED ORDER — FLUTICASONE PROPIONATE 50 MCG/ACT NA SUSP
1.0000 | Freq: Every day | NASAL | Status: DC
  Administered 2021-10-15 – 2021-10-16 (×2): 1 via NASAL
  Filled 2021-10-15: qty 16

## 2021-10-15 MED ORDER — HEPARIN (PORCINE) IN NACL 1000-0.9 UT/500ML-% IV SOLN
INTRAVENOUS | Status: DC | PRN
  Administered 2021-10-15 (×2): 500 mL

## 2021-10-15 MED ORDER — LIDOCAINE HCL (PF) 1 % IJ SOLN
INTRAMUSCULAR | Status: AC
  Filled 2021-10-15: qty 30

## 2021-10-15 MED ORDER — SODIUM CHLORIDE 0.9% FLUSH: 3.0000 mL | INTRAVENOUS | Status: DC | PRN

## 2021-10-15 MED ORDER — SODIUM CHLORIDE 0.9 % IV SOLN: INTRAVENOUS | Status: DC

## 2021-10-15 MED ORDER — LIDOCAINE HCL (PF) 1 % IJ SOLN
INTRAMUSCULAR | Status: DC | PRN
  Administered 2021-10-15: 10 mL

## 2021-10-15 MED ORDER — ACETAMINOPHEN 325 MG PO TABS: 650.0000 mg | ORAL_TABLET | ORAL | Status: DC | PRN

## 2021-10-15 MED ORDER — SODIUM CHLORIDE 0.9% FLUSH
3.0000 mL | Freq: Two times a day (BID) | INTRAVENOUS | Status: DC
  Administered 2021-10-15: 3 mL via INTRAVENOUS

## 2021-10-15 MED ORDER — HEPARIN SODIUM (PORCINE) 5000 UNIT/ML IJ SOLN
5000.0000 [IU] | Freq: Three times a day (TID) | INTRAMUSCULAR | Status: DC
  Administered 2021-10-15 – 2021-10-16 (×2): 5000 [IU] via SUBCUTANEOUS
  Filled 2021-10-15 (×2): qty 1

## 2021-10-15 SURGICAL SUPPLY — 8 items
CATH BALLN WEDGE 5F 110CM (CATHETERS) IMPLANT
GUIDEWIRE .025 260CM (WIRE) IMPLANT
KIT HEART LEFT (KITS) IMPLANT
PACK CARDIAC CATHETERIZATION (CUSTOM PROCEDURE TRAY) IMPLANT
SHEATH GLIDE SLENDER 4/5FR (SHEATH) IMPLANT
SHEATH PROBE COVER 6X72 (BAG) IMPLANT
TRANSDUCER W/STOPCOCK (MISCELLANEOUS) IMPLANT
WIRE MICROINTRODUCER 60CM (WIRE) IMPLANT

## 2021-10-15 NOTE — Interval H&P Note (Signed)
Cath Lab Visit (complete for each Cath Lab visit)  Clinical Evaluation Leading to the Procedure:   ACS: No.  Non-ACS:    Anginal Classification: CCS IV  Anti-ischemic medical therapy: No Therapy  Non-Invasive Test Results: No non-invasive testing performed  Prior CABG: No previous CABG      History and Physical Interval Note:  10/15/2021 11:44 AM  Stacey Page  has presented today for surgery, with the diagnosis of chest pain.  The various methods of treatment have been discussed with the patient and family. After consideration of risks, benefits and other options for treatment, the patient has consented to  Procedure(s): RIGHT HEART CATH (N/A) as a surgical intervention.  The patient's history has been reviewed, patient examined, no change in status, stable for surgery.  I have reviewed the patient's chart and labs.  Questions were answered to the patient's satisfaction.     Belva Crome III

## 2021-10-15 NOTE — H&P (View-Only) (Signed)
Rounding Note    Patient Name: Stacey Page Date of Encounter: 10/15/2021  [facility] Cardiologist: Glenetta Hew, MD   Subjective   Cr 2.2>2.5>2.33 Net negative 2.1L Wt 337>331  Inpatient Medications    Scheduled Meds:  aspirin  81 mg Oral Daily   atorvastatin  20 mg Oral Daily   carvedilol  6.25 mg Oral BID WC   ferrous sulfate  325 mg Oral Q breakfast   furosemide  80 mg Oral Daily   heparin injection (subcutaneous)  5,000 Units Subcutaneous Q8H   hydrALAZINE  50 mg Oral Q8H   hydrocerin   Topical BID   insulin aspart  0-15 Units Subcutaneous TID WC   isosorbide dinitrate  10 mg Oral TID   mouth rinse  15 mL Mouth Rinse 4 times per day   pantoprazole  40 mg Oral Daily   Continuous Infusions:  PRN Meds: ipratropium-albuterol, mouth rinse   Vital Signs    Vitals:   10/15/21 0000 10/15/21 0425 10/15/21 0643 10/15/21 0757  BP: (!) 127/50 (!) 129/51  95/67  Pulse: 61 70  68  Resp: '19 20  19  '$ Temp:  98.4 F (36.9 C)  98.4 F (36.9 C)  TempSrc:  Oral  Oral  SpO2: 91% 92%  90%  Weight:   (!) 150.4 kg   Height:        Intake/Output Summary (Last 24 hours) at 10/15/2021 0805 Last data filed at 10/15/2021 0644 Gross per 24 hour  Intake 480 ml  Output 2775 ml  Net -2295 ml       10/15/2021    6:43 AM 10/13/2021    4:20 AM 10/12/2021    4:37 AM  Last 3 Weights  Weight (lbs) 331 lb 9.2 oz 337 lb 1.3 oz 347 lb 0.1 oz  Weight (kg) 150.4 kg 152.9 kg 157.4 kg      Telemetry    NSR- Personally Reviewed  ECG    No new tracing today - Personally Reviewed  Physical Exam   GEN: Sitting up in bed, on [place] Neck: JVD difficult to assess due to body habitus Cardiac: RRR, 2/6 systolic murmur.  Respiratory: Clear to auscultation bilaterally. GI: Obese, soft, ND MS: No edema, warm Neuro:  Nonfocal  Psych: Normal affect   Labs    High Sensitivity Troponin:   Recent Labs  Lab 10/09/21 1722 10/09/21 1956 10/10/21 0533  TROPONINIHS 202* 226* 224*       Chemistry Recent Labs  Lab 10/10/21 0533 10/10/21 0744 10/12/21 0022 10/13/21 0852 10/13/21 0922 10/14/21 0916  NA  --    < > 142 142  --  140  K  --    < > 4.6 4.0  --  3.4*  CL  --    < > 104 95*  --  93*  CO2  --    < > 31 38*  --  39*  GLUCOSE  --    < > 110* 183*  --  151*  BUN  --    < > 47* 36*  --  37*  CREATININE  --    < > 2.76* 2.20*  --  2.50*  CALCIUM  --    < > 8.9 9.5  --  9.2  MG 2.5*  --   --   --  1.6* 1.7  GFRNONAA  --    < > 18* 24*  --  20*  ANIONGAP  --    < > 7 9  --  8   < > = values in this interval not displayed.     Lipids No results for input(s): "CHOL", "TRIG", "HDL", "LABVLDL", "LDLCALC", "CHOLHDL" in the last 168 hours.  Hematology Recent Labs  Lab 10/12/21 0022 10/13/21 0852 10/14/21 0916  WBC 5.7 4.2 3.6*  RBC 3.70* 4.05 4.33  HGB 11.0* 11.9* 12.7  HCT 36.9 39.5 41.8  MCV 99.7 97.5 96.5  MCH 29.7 29.4 29.3  MCHC 29.8* 30.1 30.4  RDW 16.0* 15.8* 15.4  PLT 149* 153 172    Thyroid No results for input(s): "TSH", "FREET4" in the last 168 hours.  BNP Recent Labs  Lab 10/09/21 1722 10/11/21 0009  BNP 576.7* 478.7*     DDimer No results for input(s): "DDIMER" in the last 168 hours.   Radiology    No results found.  Cardiac Studies   TTE 10/10/21: IMPRESSIONS   1. Left ventricular ejection fraction, by estimation, is 70 to 75%. The  left ventricle has hyperdynamic function. The left ventricle has no  regional wall motion abnormalities. There is moderate left ventricular  hypertrophy. Left ventricular diastolic  parameters are indeterminate.   2. Right ventricular systolic function is normal. The right ventricular  size is normal. There is moderately elevated pulmonary artery systolic  pressure.   3. Left atrial size was moderately dilated.   4. The mitral valve is normal in structure. No evidence of mitral valve  regurgitation. No evidence of mitral stenosis. Moderate mitral annular  calcification.   5. The aortic  valve is calcified. There is mild calcification of the  aortic valve. There is mild thickening of the aortic valve. Aortic valve  regurgitation is not visualized. Moderate aortic valve stenosis. Aortic  valve area, by VTI measures 1.88 cm.  Aortic valve mean gradient measures 23.0 mmHg. Aortic valve Vmax measures  3.12 m/s.   6. The inferior vena cava is dilated in size with <50% respiratory  variability, suggesting right atrial pressure of 15 mmHg.   Comparison(s): Prior aortic valve gradient 18 mmHg.   Patient Profile     71 y.o. female chronic diastolic heart failure, COPD, OSA, HTN, CKD stage IV, obesity, and DMII who presented with shortness of breath found to have acute respiratory failure with hypoxia and hypercarbia. Cardiology was consulted for further management of acute on chronic diastolic HF.   Assessment & Plan    #Acute on Chronic Diastolic HF: Patient presented with worsening SOB found to have acute hypoxic and hypercarbic respiratory failure due to underlying acute on chronic diastolic HF. Likely triggered by medication non-adherence. TTE with LVEF 70-75%, normal RV, moderate pulmonary HTN, moderate AS, RAP 49mHg. Has been diuresed with IV lasix with good response. Volume status difficult to assess given body habitus but suspect we are approaching euvolemia. Will plan for RHC today to better assess filling pressures.  -Will plan for RHC today to better assess filling pressures -Change to PO lasix for now -No farxiga or spiro due to CKD IV -Monitor I/Os and daily weights -Low Na diet  #Acute Respiratory Failure with Hypoxia and Hypercarbia: Mainly driven by underlying acute on chronic diastolic HF. [facility] Continues to require O2 today despite significant improvement in volume status. Will plan for RHC as above.  #Moderate AS: AVA 1.88cm2, mean gradient 260mg, Vmax 3.1275m -Continue serial management as outpatient.  #CKD  IV: Cr stable.  -Trend BMET  #HTN: Better controlled today.  -Continue hydralazine '50mg'$  TID -Continue isordil '10mg'$  TID -Continue  coreg 6.'25mg'$  BID  #OSA on CPAP: -Continue CPAP  #HLD: -Continue lipitor '20mg'$  daily      For questions or updates, please contact [place] Please consult [email or web address] for contact info under        Signed, Freada Bergeron, MD  10/15/2021, 8:05 AM

## 2021-10-15 NOTE — Progress Notes (Signed)
Rounding Note    Patient Name: Stacey Page Date of Encounter: 10/15/2021  [facility] Cardiologist: Glenetta Hew, MD   Subjective   RHC with RAP 35mHg, mPAP 254mg, PWCP 1349m, PVR 2.73WU, CO 5.5, CI 2.17  Transitioned to PO lasix  Inpatient Medications    Scheduled Meds:  aspirin  81 mg Oral Daily   atorvastatin  20 mg Oral Daily   carvedilol  6.25 mg Oral BID WC   ferrous sulfate  325 mg Oral Q breakfast   fluticasone  1 spray Each [name] Daily   furosemide  80 mg Oral Daily   heparin  5,000 Units Subcutaneous Q8H   hydrALAZINE  50 mg Oral Q8H   hydrocerin   Topical BID   insulin aspart  0-15 Units Subcutaneous TID WC   isosorbide dinitrate  10 mg Oral TID   mouth rinse  15 mL Mouth Rinse 4 times per day   pantoprazole  40 mg Oral Daily   sodium chloride flush  3 mL Intravenous Q12H   Continuous Infusions:  sodium chloride 10 mL/hr at 10/15/21 1512   sodium chloride     PRN Meds: sodium chloride, acetaminophen, hydrALAZINE, ipratropium-albuterol, labetalol, ondansetron (ZOFRAN) IV, mouth rinse, sodium chloride flush   Vital Signs    Vitals:   10/15/21 1251 10/15/21 1256 10/15/21 1301 10/15/21 1306  BP: (!) 115/57 129/63 117/62 (!) 152/131  Pulse: 67 73 64 65  Resp: '19 14 13 11  '$ Temp:      TempSrc:      SpO2: (!) 88% (!) 88% 93% 92%  Weight:      Height:        Intake/Output Summary (Last 24 hours) at 10/15/2021 1616 Last data filed at 10/15/2021 0757 Gross per 24 hour  Intake 240 ml  Output 1775 ml  Net -1535 ml       10/15/2021   11:43 AM 10/15/2021    6:43 AM 10/13/2021    4:20 AM  Last 3 Weights  Weight (lbs) 331 lb 9.2 oz 331 lb 9.2 oz 337 lb 1.3 oz  Weight (kg) 150.4 kg 150.4 kg 152.9 kg      Telemetry    NSR- Personally Reviewed  ECG    No new tracing today - Personally Reviewed  Physical Exam   GEN: Sitting up in bed, on [place] Neck: JVD difficult to assess due to body habitus Cardiac: RRR, 2/6 systolic murmur.   Respiratory: Clear to auscultation bilaterally. GI: Obese, soft, ND MS: No edema, warm Neuro:  Nonfocal  Psych: Normal affect   Labs    High Sensitivity Troponin:   Recent Labs  Lab 10/09/21 1722 10/09/21 1956 10/10/21 0533  TROPONINIHS 202* 226* 224*      Chemistry Recent Labs  Lab 10/10/21 0533 10/10/21 0744 10/13/21 0852 10/13/21 0922 10/14/21 0916 10/15/21 0736 10/15/21 1304  NA  --    < > 142  --  140 141 140  139  K  --    < > 4.0  --  3.4* 3.8 3.8  3.8  CL  --    < > 95*  --  93* 92*  --   CO2  --    < > 38*  --  39* 38*  --   GLUCOSE  --    < > 183*  --  151* 151*  --   BUN  --    < > 36*  --  37* 38*  --   CREATININE  --    < >  2.20*  --  2.50* 2.33*  --   CALCIUM  --    < > 9.5  --  9.2 9.7  --   MG 2.5*  --   --  1.6* 1.7  --   --   GFRNONAA  --    < > 24*  --  20* 22*  --   ANIONGAP  --    < > 9  --  8 11  --    < > = values in this interval not displayed.     Lipids No results for input(s): "CHOL", "TRIG", "HDL", "LABVLDL", "LDLCALC", "CHOLHDL" in the last 168 hours.  Hematology Recent Labs  Lab 10/13/21 0175 10/14/21 0916 10/15/21 0736 10/15/21 1304  WBC 4.2 3.6* 3.1*  --   RBC 4.05 4.33 4.33  --   HGB 11.9* 12.7 12.5 13.3  13.3  HCT 39.5 41.8 41.3 39.0  39.0  MCV 97.5 96.5 95.4  --   MCH 29.4 29.3 28.9  --   MCHC 30.1 30.4 30.3  --   RDW 15.8* 15.4 15.4  --   PLT 153 172 171  --     Thyroid No results for input(s): "TSH", "FREET4" in the last 168 hours.  BNP Recent Labs  Lab 10/09/21 1722 10/11/21 0009  BNP 576.7* 478.7*     DDimer No results for input(s): "DDIMER" in the last 168 hours.   Radiology    CARDIAC CATHETERIZATION  Result Date: 10/15/2021   Hemodynamic findings consistent with mild pulmonary hypertension. CONCLUSIONS: Mild pulmonary hypertension with mean PA pressure 28 mmHg, capillary wedge pressure mean 13 mmHg, and pulmonary vascular resistance 2.73 Wood units.  Pulmonary hypertension etiology suspected to  be WHO group 3. Right atrial pressure is normal with mean of 6 mmHg. Cardiac output 5.5 L/min, index 2.17 L/min/m.    Cardiac Studies   TTE 10/10/21: IMPRESSIONS   1. Left ventricular ejection fraction, by estimation, is 70 to 75%. The  left ventricle has hyperdynamic function. The left ventricle has no  regional wall motion abnormalities. There is moderate left ventricular  hypertrophy. Left ventricular diastolic  parameters are indeterminate.   2. Right ventricular systolic function is normal. The right ventricular  size is normal. There is moderately elevated pulmonary artery systolic  pressure.   3. Left atrial size was moderately dilated.   4. The mitral valve is normal in structure. No evidence of mitral valve  regurgitation. No evidence of mitral stenosis. Moderate mitral annular  calcification.   5. The aortic valve is calcified. There is mild calcification of the  aortic valve. There is mild thickening of the aortic valve. Aortic valve  regurgitation is not visualized. Moderate aortic valve stenosis. Aortic  valve area, by VTI measures 1.88 cm.  Aortic valve mean gradient measures 23.0 mmHg. Aortic valve Vmax measures  3.12 m/s.   6. The inferior vena cava is dilated in size with <50% respiratory  variability, suggesting right atrial pressure of 15 mmHg.   Comparison(s): Prior aortic valve gradient 18 mmHg.   Patient Profile     71 y.o. female chronic diastolic heart failure, COPD, OSA, HTN, CKD stage IV, obesity, and DMII who presented with shortness of breath found to have acute respiratory failure with hypoxia and hypercarbia. Cardiology was consulted for further management of acute on chronic diastolic HF.   Assessment & Plan    #Acute on Chronic Diastolic HF: Patient presented with worsening SOB found to have acute hypoxic and hypercarbic respiratory failure due  to underlying acute on chronic diastolic HF. Likely triggered by medication non-adherence. TTE with LVEF  70-75%, normal RV, moderate pulmonary HTN, moderate AS, RAP 72mHg. Has been diuresed with IV lasix with good response. RHC with normal filling pressures. Now transitioned to lasix '80mg'$  PO daily. -RHC with normal filling pressures -Continue lasix '80mg'$  PO daily -No farxiga or spiro due to CKD IV -Monitor I/Os and daily weights -Low Na diet  #Acute Respiratory Failure with Hypoxia and Hypercarbia: Mainly driven by underlying acute on chronic diastolic HF. [facility] Continues to require O2 today despite significant improvement in volume status. RHC with normal filling pressures and hypoxia cannot be explained by volume overload. Work-up per primary  #Moderate AS: AVA 1.88cm2, mean gradient 210mg, Vmax 3.1259m -Continue serial management as outpatient.  #CKD IV: Cr stable.  -Trend BMET  #HTN: Better controlled today.  -Continue hydralazine '50mg'$  TID -Continue isordil '10mg'$  TID -Continue coreg 6.'25mg'$  BID  #OSA on CPAP: -Continue CPAP  #HLD: -Continue lipitor '20mg'$  daily      For questions or updates, please contact [place] consult [email or web address] for contact info under        Signed, HeaFreada BergeronD  10/15/2021, 4:16 PM

## 2021-10-15 NOTE — Progress Notes (Signed)
PROGRESS NOTE    Stacey Page  WUJ:[id number] DOB: 1950/07/25 DOA: 10/09/2021 PCP: [name], MD   Brief Narrative: 71 year old with past medical history significant for heart failure, aortic stenosis, hypertension, hyperlipidemia, OSA, obesity, COPD, CKD stage IV and diabetes type 2 presents complaining of shortness of breath, found to be in acute hypoxic, hypercapnic respiratory failure with mental status changes.  Patient was also found to be in acute heart failure exacerbation.  Initially placed on BiPAP with improvement in mental status.  Managed with IV Lasix.  Cardiology assisting with medical management.     Assessment & Plan:   Principal Problem:   Acute respiratory failure with hypercapnia (HCC) Active Problems:   OSA (obstructive sleep apnea)   Hypertensive heart disease with chronic diastolic congestive heart failure (HCC)   Class 3 severe obesity with serious comorbidity and body mass index (BMI) of 50.0 to 59.9 in adult ([place])   ILD (interstitial lung disease) 2/2 MAI s/p med rxn   Hyperlipidemia associated with type 2 diabetes mellitus ([place])   Acute renal failure superimposed on stage 2 chronic kidney disease (HCC)   Moderate aortic stenosis by prior echocardiogram   Acute on chronic heart failure with preserved ejection fraction (HCC)   Demand ischemia (HCC)   Respiratory acidosis  1-Acute Hypoxic, Hypercapnic respiratory failure Initially required BiPAP. Using CPAP Venous gas PCO2 at 71.  Wean off of oxygen as tolerated Continue with IV Lasix In the setting of acute on chronic diastolic heart failure exacerbation, history of COPD.  Pulmonologist consulted, they will help arrange follow up. Might need repeat sleep study to adjust CPAP setting.    2-Acute on chronic diastolic heart failure exacerbation: Secondary to medication noncompliance. Continue with IV Lasix Holding Coreg due to bradycardia -7 L Weight: 360---337--331 Appreciate  cardiology assistance Plan for Right side heart cath.   AKI on CKD stage IV;  Creatinine baseline 1.8--2.05 Presents with Cr 3.3 Monitor on lasix.  Cr stable 2.3 Good urine out put.   Elevated Troponin; 202---225---224.  Primary HTN;  Continue with hydralazine, Imdur.  Holder parameter for Hypotension.   COPD;  Continue with duoneb  OSA: Continue with CPAP Aortic stenosis: Moderate stenosis. Cardiology following.   Hyperlipidemia: Continue Lipitor  Normocytic  anemia:  hemoglobin stable  Diabetes type 2: Continue with sliding scale  Hypomagnesemia; Replete.  Hypokalemia; Replaced.   Iron deficiency;  Iron 41.  Hb at 12.  Started oral iron.   Estimated body mass index is 50.42 kg/m as calculated from the following:   Height as of this encounter: '5\' 8"'$  (1.727 m).   Weight as of this encounter: 150.4 kg.   DVT prophylaxis: Heparin Code Status: Full code Family Communication: Care discussed with patient Disposition Plan:  Status is: Inpatient Remains inpatient appropriate because: Continue with IV diuresis    Consultants:  Cardiology   Procedures:  ECHO  Antimicrobials:    Subjective: She is breathing better, down on 2.5 L Oxygen sat 89--92 She has some nasal congestion. She wishes to be out of bed today.   Objective: Vitals:   10/15/21 0000 10/15/21 0425 10/15/21 0643 10/15/21 0757  BP: (!) 127/50 (!) 129/51  95/67  Pulse: 61 70  68  Resp: '19 20  19  '$ Temp:  98.4 F (36.9 C)  98.4 F (36.9 C)  TempSrc:  Oral  Oral  SpO2: 91% 92%  90%  Weight:   (!) 150.4 kg   Height:        Intake/Output Summary (  Last 24 hours) at 10/15/2021 0819 Last data filed at 10/15/2021 0644 Gross per 24 hour  Intake 480 ml  Output 2775 ml  Net -2295 ml    Filed Weights   10/12/21 0437 10/13/21 0420 10/15/21 0643  Weight: (!) 157.4 kg (!) 152.9 kg (!) 150.4 kg    Examination:  General exam: NAD Respiratory system: CTA Cardiovascular system: S 1, S 2  RRR Gastrointestinal system: BS present, soft, nt Central nervous system: Alert, oriented.  Extremities: No edema  Data Reviewed: I have personally reviewed following labs and imaging studies  CBC: Recent Labs  Lab 10/11/21 0009 10/12/21 0022 10/13/21 0852 10/14/21 0916 10/15/21 0736  WBC 5.2 5.7 4.2 3.6* 3.1*  HGB 11.3* 11.0* 11.9* 12.7 12.5  HCT 38.2 36.9 39.5 41.8 41.3  MCV 100.8* 99.7 97.5 96.5 95.4  PLT 143* 149* 153 172 062    Basic Metabolic Panel: Recent Labs  Lab 10/10/21 0533 10/10/21 0744 10/11/21 0009 10/12/21 0022 10/13/21 0852 10/13/21 0922 10/14/21 0916  NA  --  142 143 142 142  --  140  K  --  4.8 4.6 4.6 4.0  --  3.4*  CL  --  108 107 104 95*  --  93*  CO2  --  '29 29 31 '$ 38*  --  39*  GLUCOSE  --  128* 95 110* 183*  --  151*  BUN  --  48* 50* 47* 36*  --  37*  CREATININE  --  3.24* 3.31* 2.76* 2.20*  --  2.50*  CALCIUM  --  8.8* 8.8* 8.9 9.5  --  9.2  MG 2.5*  --   --   --   --  1.6* 1.7    GFR: Estimated Creatinine Clearance: 33 mL/min (A) (by C-G formula based on SCr of 2.5 mg/dL (H)). Liver Function Tests: No results for input(s): "AST", "ALT", "ALKPHOS", "BILITOT", "PROT", "ALBUMIN" in the last 168 hours. No results for input(s): "LIPASE", "AMYLASE" in the last 168 hours. No results for input(s): "AMMONIA" in the last 168 hours. Coagulation Profile: No results for input(s): "INR", "PROTIME" in the last 168 hours. Cardiac Enzymes: No results for input(s): "CKTOTAL", "CKMB", "CKMBINDEX", "TROPONINI" in the last 168 hours. BNP (last 3 results) No results for input(s): "PROBNP" in the last 8760 hours. HbA1C: No results for input(s): "HGBA1C" in the last 72 hours. CBG: Recent Labs  Lab 10/14/21 0617 10/14/21 1128 10/14/21 1647 10/14/21 2121 10/15/21 0639  GLUCAP 124* 78 128* 144* 124*    Lipid Profile: No results for input(s): "CHOL", "HDL", "LDLCALC", "TRIG", "CHOLHDL", "LDLDIRECT" in the last 72 hours. Thyroid Function Tests: No  results for input(s): "TSH", "T4TOTAL", "FREET4", "T3FREE", "THYROIDAB" in the last 72 hours. Anemia Panel: Recent Labs    10/14/21 0916  FERRITIN 51  TIBC 279  IRON 41    Sepsis Labs: Recent Labs  Lab 10/09/21 1956 10/10/21 0744 10/11/21 0009  PROCALCITON <0.10 <0.10 <0.10  LATICACIDVEN 0.7  0.8  --   --      Recent Results (from the past 240 hour(s))  Resp Panel by RT-PCR (Flu A&B, Covid) Anterior Nasal Swab     Status: None   Collection Time: 10/09/21  5:22 PM   Specimen: Anterior Nasal Swab  Result Value Ref Range Status   SARS Coronavirus 2 by RT PCR NEGATIVE NEGATIVE Final    Comment: (NOTE) SARS-CoV-2 target nucleic acids are NOT DETECTED.  The SARS-CoV-2 RNA is generally detectable in upper respiratory specimens during the acute  phase of infection. The lowest concentration of SARS-CoV-2 viral copies this assay can detect is 138 copies/mL. A negative result does not preclude SARS-Cov-2 infection and should not be used as the sole basis for treatment or other patient management decisions. A negative result may occur with  improper specimen collection/handling, submission of specimen other than nasopharyngeal swab, presence of viral mutation(s) within the areas targeted by this assay, and inadequate number of viral copies(<138 copies/mL). A negative result must be combined with clinical observations, patient history, and epidemiological information. The expected result is Negative.  Fact Sheet for Patients:  EntrepreneurPulse.com.au  Fact Sheet for Healthcare Providers:  IncredibleEmployment.be  This test is no t yet approved or cleared by the [place] FDA and  has been authorized for detection and/or diagnosis of SARS-CoV-2 by FDA under an Emergency Use Authorization (EUA). This EUA will remain  in effect (meaning this test can be used) for the duration of the COVID-19 declaration under Section 564(b)(1) of the Act,  21 U.S.C.section 360bbb-3(b)(1), unless the authorization is terminated  or revoked sooner.       Influenza A by PCR NEGATIVE NEGATIVE Final   Influenza B by PCR NEGATIVE NEGATIVE Final    Comment: (NOTE) The Xpert Xpress SARS-CoV-2/FLU/RSV plus assay is intended as an aid in the diagnosis of influenza from Nasopharyngeal swab specimens and should not be used as a sole basis for treatment. Nasal washings and aspirates are unacceptable for Xpert Xpress SARS-CoV-2/FLU/RSV testing.  Fact Sheet for Patients: EntrepreneurPulse.com.au  Fact Sheet for Healthcare Providers: IncredibleEmployment.be  This test is not yet approved or cleared by the [place] FDA and has been authorized for detection and/or diagnosis of SARS-CoV-2 by FDA under an Emergency Use Authorization (EUA). This EUA will remain in effect (meaning this test can be used) for the duration of the COVID-19 declaration under Section 564(b)(1) of the Act, 21 U.S.C. section 360bbb-3(b)(1), unless the authorization is terminated or revoked.  Performed at [facility], [address] [place], [place] [address]   MRSA Next Gen by PCR, Nasal     Status: None   Collection Time: 10/10/21 12:52 AM   Specimen: Nasal Mucosa; Nasal Swab  Result Value Ref Range Status   MRSA by PCR Next Gen NOT DETECTED NOT DETECTED Final    Comment: (NOTE) The GeneXpert MRSA Assay (FDA approved for NASAL specimens only), is one component of a comprehensive MRSA colonization surveillance program. It is not intended to diagnose MRSA infection nor to guide or monitor treatment for MRSA infections. Test performance is not FDA approved in patients less than 17 years old. Performed at [facility], [place] [address], [place], [place] [address]          Radiology Studies: No results found.      Scheduled Meds:  aspirin  81 mg Oral Daily   atorvastatin  20 mg Oral Daily    carvedilol  6.25 mg Oral BID WC   ferrous sulfate  325 mg Oral Q breakfast   furosemide  80 mg Oral Daily   heparin injection (subcutaneous)  5,000 Units Subcutaneous Q8H   hydrALAZINE  50 mg Oral Q8H   hydrocerin   Topical BID   insulin aspart  0-15 Units Subcutaneous TID WC   isosorbide dinitrate  10 mg Oral TID   mouth rinse  15 mL Mouth Rinse 4 times per day   pantoprazole  40 mg Oral Daily   Continuous Infusions:   LOS: 5 days  Time spent: 35 minutes.    Elmarie Shiley, MD [facility]   If 7PM-7AM, please contact night-coverage [email or web address]  10/15/2021, 8:19 AM

## 2021-10-15 NOTE — CV Procedure (Deleted)
Mild pulmonary hypertension with mean pressure 30 mmHg.  Pulmonary vascular resistance 2.2 Woods units, cardiac output 5.9 L/min; index 3.26 L/min/m; pulmonary wedge 17 mmHg; right atrial mean 4 mmHg.  Suspect WHO group 2 pulmonary hypertension Aortic stenosis with peak to peak gradient 47 mmHg and mean gradient 37 mmHg Calculated aortic valve area 1 cm Right dominant anatomy.  Widely patent left main.  Minimal luminal irregularities in proximal and mid LAD with up to 30 to 40% narrowing..  Circumflex is widely patent. LV gram not performed.  LVEDP 23 mmHg Plan referral to TAVR clinic for consideration of aortic valve replacement

## 2021-10-15 NOTE — Telephone Encounter (Signed)
Pt scheduled to see Stacey Page on 9/22. This will be printed on discharge papers.

## 2021-10-15 NOTE — Evaluation (Addendum)
Physical Therapy Evaluation Patient Details Name: Stacey Page MRN: [id number] DOB: [birth year] Today's Date: 10/15/2021  History of Present Illness  Pt is a 71 y.o. female admitted 10/09/21 with AMS. Workup for acute hypoxic respiratory failure, HF exacerbation. Plan for [place] 9/8. PMH includes CHF, aortic stenosis, HTN, HLD, OSA, COPD, CKD IV, DM2, obesity.   Clinical Impression  Pt presents with an overall decrease in functional mobility secondary to above. PTA, pt independent without DME, sedentary without regular activity/hobbies, lives with grandson. Today, pt moving well with preference for rollator for added stability and energy conservation. Educ re: activity recommendations, incentive spirometer use, importance of increased activity (including walking program for home). Pt would benefit from continued acute PT services to maximize functional mobility and independence prior to d/c with HHPT services.     SATURATION QUALIFICATIONS:  Patient Saturations on Room Air at Rest = 86% Patient Saturations on [employer] while Ambulating = 80% Patient Saturations on 3 Liters of oxygen while Ambulating = 86%  *pt declined additional gait trial on 4L O2 [place] due to fatigue; will plan to test again with nursing/mobility specialist later   Recommendations for follow up therapy are one component of a multi-disciplinary discharge planning process, led by the attending physician.  Recommendations may be updated based on patient status, additional functional criteria and insurance authorization.  Follow Up Recommendations Home health PT      Assistance Recommended at Discharge PRN  Patient can return home with the following  Assistance with cooking/housework;Assist for transportation    Equipment Recommendations Rollator (4 wheels) (bariatric)  Recommendations for Other Services   Mobility Specialist   Functional Status Assessment Patient has had a recent decline in their functional status and  demonstrates the ability to make significant improvements in function in a reasonable and predictable amount of time.     Precautions / Restrictions Precautions Precautions: Fall;Other (comment) Precaution Comments: Watch SpO2 (does not wear baseline) Restrictions Weight Bearing Restrictions: No      Mobility  Bed Mobility               General bed mobility comments: received sitting in recliner    Transfers Overall transfer level: Independent Equipment used: None                    Ambulation/Gait Ambulation/Gait assistance: Modified independent (Device/Increase time) Gait Distance (Feet): 120 Feet Assistive device: None, Rollator (4 wheels) Gait Pattern/deviations: Step-through pattern, Decreased stride length, Trunk flexed Gait velocity: Decreased     General Gait Details: ambulating in room without DME; slow, fatigued gait with rollator for hallway ambulation, 1x seated rest secondary to SOB and fatigue; cues for pursed lip breathing  Stairs            Wheelchair Mobility    Modified Rankin (Stroke Patients Only)       Balance Overall balance assessment: Needs assistance   Sitting balance-Leahy Scale: Good     Standing balance support: No upper extremity supported, During functional activity Standing balance-Leahy Scale: Fair Standing balance comment: preference for UE support                             Pertinent Vitals/Pain Pain Assessment Pain Assessment: No/denies pain    Home Living Family/patient expects to be discharged to:: Private residence Living Arrangements: Other relatives (77 y.o. grandson) Available Help at Discharge: Family;Available PRN/intermittently Type of Home: House Home Access: Level entry  Home Layout: One level Home Equipment: None Additional Comments: would like to have bariatric rollator    Prior Function Prior Level of Function : Independent/Modified Independent              Mobility Comments: Typically independent without DME, sedentary. Reports, "I don't like doing anything" when asked about hobbies. Daughter present and reports pt inactive and "doesn't do what she's supposed to" ADLs Comments: does bird baths at sink due to difficulty getting in/out of tub with "bad knees"     Hand Dominance        Extremity/Trunk Assessment   Upper Extremity Assessment Upper Extremity Assessment: Overall WFL for tasks assessed    Lower Extremity Assessment Lower Extremity Assessment: Generalized weakness       Communication   Communication: No difficulties  Cognition Arousal/Alertness: Awake/alert Behavior During Therapy: Flat affect Overall Cognitive Status: Within Functional Limits for tasks assessed                                          General Comments General comments (skin integrity, edema, etc.): pt's daughter present and supportive. SpO2 down to 80% on RA with ambulation, 86% on 3L O2 with ambulation; pt declined additional gait trial on 4L O2    Exercises  Incentive spirometer x5 - pt pulling ~1200-1500 mL   Assessment/Plan    PT Assessment Patient needs continued PT services  PT Problem List Decreased strength;Decreased activity tolerance;Decreased balance;Decreased mobility;Cardiopulmonary status limiting activity       PT Treatment Interventions DME instruction;Gait training;Functional mobility training;Therapeutic activities;Therapeutic exercise;Balance training;Patient/family education    PT Goals (Current goals can be found in the Care Plan section)  Acute Rehab PT Goals Patient Stated Goal: return home PT Goal Formulation: With patient Time For Goal Achievement: 10/29/21 Potential to Achieve Goals: Good    Frequency Min 3X/week     Co-evaluation               AM-PAC PT "6 Clicks" Mobility  Outcome Measure Help needed turning from your back to your side while in a flat bed without using bedrails?:  None Help needed moving from lying on your back to sitting on the side of a flat bed without using bedrails?: None Help needed moving to and from a bed to a chair (including a wheelchair)?: None Help needed standing up from a chair using your arms (e.g., wheelchair or bedside chair)?: None Help needed to walk in hospital room?: None Help needed climbing 3-5 steps with a railing? : A Little 6 Click Score: 23    End of Session Equipment Utilized During Treatment: Oxygen Activity Tolerance: Patient tolerated treatment well;Patient limited by fatigue Patient left: in chair;with call bell/phone within reach;with family/visitor present Nurse Communication: Mobility status PT Visit Diagnosis: Other abnormalities of gait and mobility (R26.89);Muscle weakness (generalized) (M62.81)    Time: 1036-1100 PT Time Calculation (min) (ACUTE ONLY): 24 min   Charges:   PT Evaluation $PT Eval Low Complexity: 1 Low PT Treatments $Self Care/Home Management: 8-22      Mabeline Caras, PT, DPT Acute Rehabilitation Services  Personal: [facility] Office: [place] 10/15/2021, 11:36 AM

## 2021-10-15 NOTE — Progress Notes (Signed)
Rounding Note    Patient Name: Stacey Page Date of Encounter: 10/15/2021  [place] Cardiologist: Glenetta Hew, MD   Subjective   Cr 2.2>2.5>2.33 Net negative 2.1L Wt 337>331  Inpatient Medications    Scheduled Meds:  aspirin  81 mg Oral Daily   atorvastatin  20 mg Oral Daily   carvedilol  6.25 mg Oral BID WC   ferrous sulfate  325 mg Oral Q breakfast   furosemide  80 mg Oral Daily   heparin injection (subcutaneous)  5,000 Units Subcutaneous Q8H   hydrALAZINE  50 mg Oral Q8H   hydrocerin   Topical BID   insulin aspart  0-15 Units Subcutaneous TID WC   isosorbide dinitrate  10 mg Oral TID   mouth rinse  15 mL Mouth Rinse 4 times per day   pantoprazole  40 mg Oral Daily   Continuous Infusions:  PRN Meds: ipratropium-albuterol, mouth rinse   Vital Signs    Vitals:   10/15/21 0000 10/15/21 0425 10/15/21 0643 10/15/21 0757  BP: (!) 127/50 (!) 129/51  95/67  Pulse: 61 70  68  Resp: '19 20  19  '$ Temp:  98.4 F (36.9 C)  98.4 F (36.9 C)  TempSrc:  Oral  Oral  SpO2: 91% 92%  90%  Weight:   (!) 150.4 kg   Height:        Intake/Output Summary (Last 24 hours) at 10/15/2021 0805 Last data filed at 10/15/2021 0644 Gross per 24 hour  Intake 480 ml  Output 2775 ml  Net -2295 ml       10/15/2021    6:43 AM 10/13/2021    4:20 AM 10/12/2021    4:37 AM  Last 3 Weights  Weight (lbs) 331 lb 9.2 oz 337 lb 1.3 oz 347 lb 0.1 oz  Weight (kg) 150.4 kg 152.9 kg 157.4 kg      Telemetry    NSR- Personally Reviewed  ECG    No new tracing today - Personally Reviewed  Physical Exam   GEN: Sitting up in bed, on [place] Neck: JVD difficult to assess due to body habitus Cardiac: RRR, 2/6 systolic murmur.  Respiratory: Clear to auscultation bilaterally. GI: Obese, soft, ND MS: No edema, warm Neuro:  Nonfocal  Psych: Normal affect   Labs    High Sensitivity Troponin:   Recent Labs  Lab 10/09/21 1722 10/09/21 1956 10/10/21 0533  TROPONINIHS 202* 226* 224*       Chemistry Recent Labs  Lab 10/10/21 0533 10/10/21 0744 10/12/21 0022 10/13/21 0852 10/13/21 0922 10/14/21 0916  NA  --    < > 142 142  --  140  K  --    < > 4.6 4.0  --  3.4*  CL  --    < > 104 95*  --  93*  CO2  --    < > 31 38*  --  39*  GLUCOSE  --    < > 110* 183*  --  151*  BUN  --    < > 47* 36*  --  37*  CREATININE  --    < > 2.76* 2.20*  --  2.50*  CALCIUM  --    < > 8.9 9.5  --  9.2  MG 2.5*  --   --   --  1.6* 1.7  GFRNONAA  --    < > 18* 24*  --  20*  ANIONGAP  --    < > 7 9  --  8   < > = values in this interval not displayed.     Lipids No results for input(s): "CHOL", "TRIG", "HDL", "LABVLDL", "LDLCALC", "CHOLHDL" in the last 168 hours.  Hematology Recent Labs  Lab 10/12/21 0022 10/13/21 0852 10/14/21 0916  WBC 5.7 4.2 3.6*  RBC 3.70* 4.05 4.33  HGB 11.0* 11.9* 12.7  HCT 36.9 39.5 41.8  MCV 99.7 97.5 96.5  MCH 29.7 29.4 29.3  MCHC 29.8* 30.1 30.4  RDW 16.0* 15.8* 15.4  PLT 149* 153 172    Thyroid No results for input(s): "TSH", "FREET4" in the last 168 hours.  BNP Recent Labs  Lab 10/09/21 1722 10/11/21 0009  BNP 576.7* 478.7*     DDimer No results for input(s): "DDIMER" in the last 168 hours.   Radiology    No results found.  Cardiac Studies   TTE 10/10/21: IMPRESSIONS   1. Left ventricular ejection fraction, by estimation, is 70 to 75%. The  left ventricle has hyperdynamic function. The left ventricle has no  regional wall motion abnormalities. There is moderate left ventricular  hypertrophy. Left ventricular diastolic  parameters are indeterminate.   2. Right ventricular systolic function is normal. The right ventricular  size is normal. There is moderately elevated pulmonary artery systolic  pressure.   3. Left atrial size was moderately dilated.   4. The mitral valve is normal in structure. No evidence of mitral valve  regurgitation. No evidence of mitral stenosis. Moderate mitral annular  calcification.   5. The aortic  valve is calcified. There is mild calcification of the  aortic valve. There is mild thickening of the aortic valve. Aortic valve  regurgitation is not visualized. Moderate aortic valve stenosis. Aortic  valve area, by VTI measures 1.88 cm.  Aortic valve mean gradient measures 23.0 mmHg. Aortic valve Vmax measures  3.12 m/s.   6. The inferior vena cava is dilated in size with <50% respiratory  variability, suggesting right atrial pressure of 15 mmHg.   Comparison(s): Prior aortic valve gradient 18 mmHg.   Patient Profile     71 y.o. female chronic diastolic heart failure, COPD, OSA, HTN, CKD stage IV, obesity, and DMII who presented with shortness of breath found to have acute respiratory failure with hypoxia and hypercarbia. Cardiology was consulted for further management of acute on chronic diastolic HF.   Assessment & Plan    #Acute on Chronic Diastolic HF: Patient presented with worsening SOB found to have acute hypoxic and hypercarbic respiratory failure due to underlying acute on chronic diastolic HF. Likely triggered by medication non-adherence. TTE with LVEF 70-75%, normal RV, moderate pulmonary HTN, moderate AS, RAP 40mHg. Has been diuresed with IV lasix with good response. Volume status difficult to assess given body habitus but suspect we are approaching euvolemia. Will plan for RHC today to better assess filling pressures.  -Will plan for RHC today to better assess filling pressures -Change to PO lasix for now -No farxiga or spiro due to CKD IV -Monitor I/Os and daily weights -Low Na diet  #Acute Respiratory Failure with Hypoxia and Hypercarbia: Mainly driven by underlying acute on chronic diastolic HF. [facility] Continues to require O2 today despite significant improvement in volume status. Will plan for RHC as above.  #Moderate AS: AVA 1.88cm2, mean gradient 266mg, Vmax 3.1252m -Continue serial management as outpatient.  #CKD  IV: Cr stable.  -Trend BMET  #HTN: Better controlled today.  -Continue hydralazine '50mg'$  TID -Continue isordil '10mg'$  TID -Continue  coreg 6.'25mg'$  BID  #OSA on CPAP: -Continue CPAP  #HLD: -Continue lipitor '20mg'$  daily      For questions or updates, please contact [place] Please consult [email or web address] for contact info under        Signed, Freada Bergeron, MD  10/15/2021, 8:05 AM

## 2021-10-16 DIAGNOSIS — J9602 Acute respiratory failure with hypercapnia: Secondary | ICD-10-CM | POA: Diagnosis not present

## 2021-10-16 DIAGNOSIS — I5033 Acute on chronic diastolic (congestive) heart failure: Secondary | ICD-10-CM | POA: Diagnosis not present

## 2021-10-16 DIAGNOSIS — N179 Acute kidney failure, unspecified: Secondary | ICD-10-CM | POA: Diagnosis not present

## 2021-10-16 DIAGNOSIS — I5031 Acute diastolic (congestive) heart failure: Secondary | ICD-10-CM | POA: Diagnosis not present

## 2021-10-16 LAB — BASIC METABOLIC PANEL
Anion gap: 11 (ref 5–15)
BUN: 44 mg/dL — ABNORMAL HIGH (ref 8–23)
CO2: 34 mmol/L — ABNORMAL HIGH (ref 22–32)
Calcium: 9.2 mg/dL (ref 8.9–10.3)
Chloride: 93 mmol/L — ABNORMAL LOW (ref 98–111)
Creatinine, Ser: 2.4 mg/dL — ABNORMAL HIGH (ref 0.44–1.00)
GFR, Estimated: 21 mL/min — ABNORMAL LOW (ref >60)
Glucose, Bld: 120 mg/dL — ABNORMAL HIGH (ref 70–99)
Potassium: 3.9 mmol/L (ref 3.5–5.1)
Sodium: 138 mmol/L (ref 135–145)

## 2021-10-16 LAB — GLUCOSE, CAPILLARY: Glucose-Capillary: 151 mg/dL — ABNORMAL HIGH (ref 70–99)

## 2021-10-16 LAB — MAGNESIUM: Magnesium: 2.1 mg/dL (ref 1.7–2.4)

## 2021-10-16 MED ORDER — ISOSORBIDE DINITRATE 10 MG PO TABS: 10.0000 mg | ORAL_TABLET | Freq: Three times a day (TID) | ORAL | 3 refills | Status: DC

## 2021-10-16 MED ORDER — FUROSEMIDE 80 MG PO TABS: 80.0000 mg | ORAL_TABLET | Freq: Every day | ORAL | 6 refills | Status: DC

## 2021-10-16 MED ORDER — PANTOPRAZOLE SODIUM 40 MG PO TBEC: 40.0000 mg | DELAYED_RELEASE_TABLET | Freq: Every day | ORAL | 0 refills | Status: DC

## 2021-10-16 MED ORDER — HYDRALAZINE HCL 50 MG PO TABS: 50.0000 mg | ORAL_TABLET | Freq: Three times a day (TID) | ORAL | 3 refills | Status: DC

## 2021-10-16 NOTE — TOC Transition Note (Signed)
Transition of Care [facility]) - CM/SW Discharge Note   Patient Details  Name: Stacey Page MRN: [id number] Date of Birth: [birth year]  [facility] CM/SW Contact:  Konrad Penta, RN Phone Number: [phone] 10/16/2021, 9:59 AM   Clinical Narrative:   Spoke with Mrs. Klasen at bedside. States she lives with daughter and grandchild. Daughter to transport home today. Discussed DME and home health needs. No preference for home health agency. Requires home oxygen. States rolling walker and bedside commode is also needed. States her bathroom is not close by for her to ambulate to.   Spoke with Amy with [facility]. Amy is able to accept referral for home health PT  Spoke with [facility] with [facility] to request home oxygen, bariatric rollator, and bariatric bedside commode. DME to be delivered to room prior to dc. Made nursing aware.   Final next level of care: [place] Barriers to Discharge: No Barriers Identified   Patient Goals and CMS Choice Patient states their goals for this hospitalization and ongoing recovery are:: return home CMS Medicare.gov Compare Post Acute Care list provided to:: Patient Choice offered to / list presented to : Patient  Discharge Placement                       Discharge Plan and Services                DME Arranged: Bedside commode, Oxygen, Walker rolling DME Agency: [employer] Date DME Agency Contacted: 10/16/21 Time DME Agency Contacted: [phone] Representative spoke with at DME Agency: [place]: PT [place]: [place] Date [place]: 10/16/21 Time [place]: [phone] Representative spoke with at [place]: Amy  Social Determinants of Health ([place]) Interventions     Readmission Risk Interventions     No data to display

## 2021-10-16 NOTE — Progress Notes (Signed)
Mobility Specialist Progress Note    10/16/21 1035  Mobility  Activity Ambulated independently in hallway  Level of Assistance Modified independent, requires aide device or extra time  Assistive Device Four wheel walker  Distance Ambulated (ft) 150 ft  Activity Response Tolerated well  $Mobility charge 1 Mobility   Pre-Mobility: 59 HR, 95% SpO2 During Mobility: 82 HR, 87-90% on 4LO2 SpO2 Post-Mobility: 70 HR, 88% SpO2  Pt received in bed and agreeable. No complaints on walk. On 4LO2. Returned to bed with call bell in reach.    Hildred Alamin Mobility Specialist

## 2021-10-16 NOTE — Plan of Care (Signed)
  Problem: Education: Goal: Knowledge of General Education information will improve Description: Including pain rating scale, medication(s)/side effects and non-pharmacologic comfort measures Outcome: Completed/Met   Problem: Health Behavior/Discharge Planning: Goal: Ability to manage health-related needs will improve Outcome: Completed/Met   Problem: Clinical Measurements: Goal: Ability to maintain clinical measurements within normal limits will improve Outcome: Completed/Met Goal: Will remain free from infection Outcome: Completed/Met Goal: Diagnostic test results will improve Outcome: Completed/Met Goal: Respiratory complications will improve Outcome: Completed/Met Goal: Cardiovascular complication will be avoided Outcome: Completed/Met   Problem: Activity: Goal: Risk for activity intolerance will decrease Outcome: Completed/Met   Problem: Nutrition: Goal: Adequate nutrition will be maintained Outcome: Completed/Met   Problem: Coping: Goal: Level of anxiety will decrease Outcome: Completed/Met   Problem: Elimination: Goal: Will not experience complications related to bowel motility Outcome: Completed/Met Goal: Will not experience complications related to urinary retention Outcome: Completed/Met   Problem: Pain Managment: Goal: General experience of comfort will improve Outcome: Completed/Met   Problem: Safety: Goal: Ability to remain free from injury will improve Outcome: Completed/Met   Problem: Skin Integrity: Goal: Risk for impaired skin integrity will decrease Outcome: Completed/Met   Problem: Education: Goal: Ability to describe self-care measures that may prevent or decrease complications (Diabetes Survival Skills Education) will improve Outcome: Completed/Met Goal: Individualized Educational Video(s) Outcome: Completed/Met   Problem: Coping: Goal: Ability to adjust to condition or change in health will improve Outcome: Completed/Met   Problem:  Fluid Volume: Goal: Ability to maintain a balanced intake and output will improve Outcome: Completed/Met   Problem: Health Behavior/Discharge Planning: Goal: Ability to identify and utilize available resources and services will improve Outcome: Completed/Met Goal: Ability to manage health-related needs will improve Outcome: Completed/Met   Problem: Metabolic: Goal: Ability to maintain appropriate glucose levels will improve Outcome: Completed/Met   Problem: Nutritional: Goal: Maintenance of adequate nutrition will improve Outcome: Completed/Met Goal: Progress toward achieving an optimal weight will improve Outcome: Completed/Met   Problem: Skin Integrity: Goal: Risk for impaired skin integrity will decrease Outcome: Completed/Met   Problem: Tissue Perfusion: Goal: Adequacy of tissue perfusion will improve Outcome: Completed/Met   Problem: Education: Goal: Understanding of CV disease, CV risk reduction, and recovery process will improve Outcome: Completed/Met Goal: Individualized Educational Video(s) Outcome: Completed/Met   Problem: Activity: Goal: Ability to return to baseline activity level will improve Outcome: Completed/Met   Problem: Cardiovascular: Goal: Ability to achieve and maintain adequate cardiovascular perfusion will improve Outcome: Completed/Met Goal: Vascular access site(s) Level 0-1 will be maintained Outcome: Completed/Met   Problem: Health Behavior/Discharge Planning: Goal: Ability to safely manage health-related needs after discharge will improve Outcome: Completed/Met   Problem: Acute Rehab PT Goals(only PT should resolve) Goal: Pt Will Ambulate Outcome: Completed/Met

## 2021-10-16 NOTE — Discharge Summary (Signed)
Physician Discharge Summary   Patient: Stacey Page MRN: [id number] DOB: 06/11/1950  Admit date:     10/09/2021  Discharge date: 10/16/21  Discharge Physician: Stacey Page   PCP: Stacey Maw, MD   Recommendations at discharge:    Needs follow up with Nephrology for further care CKD Follow up with cardiology for further care Heart failure.  Needs follow up with pulmonology for Sleep study.   Discharge Diagnoses: Principal Problem:   Acute on chronic heart failure with preserved ejection fraction (HCC) Active Problems:   OSA (obstructive sleep apnea)   Hypertensive heart disease with chronic diastolic congestive heart failure (HCC)   Class 3 severe obesity with serious comorbidity and body mass index (BMI) of 50.0 to 59.9 in adult ([place])   ILD (interstitial lung disease) 2/2 MAI s/p med rxn   Hyperlipidemia associated with type 2 diabetes mellitus ([place])   Acute renal failure superimposed on stage 2 chronic kidney disease (HCC)   Moderate aortic stenosis by prior echocardiogram   Acute respiratory failure with hypercapnia ([place])   Demand ischemia (HCC)   Respiratory acidosis  Resolved Problems:   * No resolved hospital problems. *  Hospital Course: 71 year old with past medical history significant for heart failure, aortic stenosis, hypertension, hyperlipidemia, OSA, obesity, COPD, CKD stage IV and diabetes type 2 presents complaining of shortness of breath, found to be in acute hypoxic, hypercapnic respiratory failure with mental status changes.  Patient was also found to be in acute heart failure exacerbation.  Initially placed on BiPAP with improvement in mental status.  Managed with IV Lasix.  Cardiology assisting with medical management.     Assessment and Plan: 1-Acute Hypoxic, Hypercapnic respiratory failure Initially required BiPAP. Using CPAP Venous gas PCO2 at 71.  Wean off of oxygen as tolerated Treated  with IV Lasix In the setting of acute on  chronic diastolic heart failure exacerbation, history of COPD.  Pulmonologist consulted, they will help arrange follow up. Might need repeat sleep study to adjust CPAP setting.  She will be discharge on Oxygen 4 L.    2-Acute on chronic diastolic heart failure exacerbation: Secondary to medication noncompliance. Treated  with IV Lasix -9 L Weight: 360---337--331--334 Appreciate cardiology assistance Right side heart cath.  RAP 6 mm, PW CP 13 mm, PVR 2.7 Patient  was transitioned to oral Lasix 9/8 Discharge on 80 mg lasix daily.   AKI on CKD stage IV;  Creatinine baseline 1.8--2.05 Presents with Cr 3.3 Monitor on lasix.  Cr stable 2.3 Good urine out put.    Elevated Troponin; 202---225---224.   Primary HTN;  Continue with hydralazine, Imdur.  Holder parameter for Hypotension.    COPD;  Continue with duoneb   OSA: Continue with CPAP Aortic stenosis: Moderate stenosis. Cardiology following.    Hyperlipidemia: Continue Lipitor   Normocytic  anemia:  hemoglobin stable   Diabetes type 2: Continue with sliding scale   Hypomagnesemia; Replete.  Hypokalemia; Replaced.    Iron deficiency;  Iron 41.  Hb at 12.  Started oral iron.    Morbid-obesity. Needs lifestyle modification.  BMI 50       Consultants: Pulmonology, cardiology  Procedures performed: Right side [place] cath  Disposition: Home Diet recommendation:  Discharge Diet Orders (From admission, onward)     Start     Ordered   10/16/21 0000  Diet - low sodium heart healthy        10/16/21 0858           Cardiac  diet DISCHARGE MEDICATION: Allergies as of 10/16/2021   No Known Allergies      Medication List     STOP taking these medications    lisinopril 20 MG tablet Commonly known as: ZESTRIL       TAKE these medications    albuterol 108 (90 Base) MCG/ACT inhaler Commonly known as: ProAir HFA Inhale 1-2 puffs into the lungs every 6 (six) hours as needed for wheezing or shortness of  breath.   aspirin EC 81 MG tablet Take 1 tablet (81 mg total) by mouth daily with breakfast.   atorvastatin 20 MG tablet Commonly known as: LIPITOR TAKE 1 TABLET BY MOUTH  DAILY AT 6 PM. What changed:  how to take this when to take this additional instructions   carvedilol 25 MG tablet Commonly known as: COREG Take 1 tablet (25 mg total) by mouth 2 (two) times daily.   cholecalciferol 25 MCG (1000 UNIT) tablet Commonly known as: VITAMIN D3 Take 2,000 Units by mouth daily.   Docusate Sodium 100 MG capsule Take 100 mg by mouth 2 (two) times daily.   ferrous sulfate 325 (65 FE) MG tablet Take 325 mg by mouth 2 (two) times daily with a meal.   fluticasone 50 MCG/ACT nasal spray Commonly known as: FLONASE instill ONE SPRAY in each nostril daily What changed: See the new instructions.   FreeStyle Libre 14 Day Sensor Misc Place one sensor on the skin every 14 days to monitor blood sugars continuously as directed.   furosemide 80 MG tablet Commonly known as: LASIX Take 1 tablet (80 mg total) by mouth daily. Start taking on: October 17, 2021 What changed:  medication strength how much to take how to take this when to take this additional instructions   gabapentin 100 MG capsule Commonly known as: NEURONTIN Take 1 capsule (100 mg total) by mouth 2 (two) times daily.   hydrALAZINE 50 MG tablet Commonly known as: APRESOLINE Take 1 tablet (50 mg total) by mouth every 8 (eight) hours. What changed:  how much to take when to take this   isosorbide dinitrate 10 MG tablet Commonly known as: ISORDIL Take 1 tablet (10 mg total) by mouth 3 (three) times daily. What changed:  medication strength how much to take how to take this when to take this additional instructions   Januvia 25 MG tablet Generic drug: sitaGLIPtin TAKE ONE TABLET BY MOUTH ONCE DAILY What changed: how much to take   montelukast 10 MG tablet Commonly known as: SINGULAIR Take 1 tablet (10 mg  total) by mouth at bedtime.   OneTouch Delica Lancets 62B Misc Use to monitor blood sugars once daily as directed   Leon w/Device Kit 1 kit by Does not apply route daily as needed.   OneTouch Verio test strip Generic drug: glucose blood Use to monitor blood sugar twice daily as instructed   pantoprazole 40 MG tablet Commonly known as: PROTONIX Take 1 tablet (40 mg total) by mouth daily. Start taking on: October 17, 2021               Durable Medical Equipment  (From admission, onward)           Start     Ordered   10/16/21 1008  For home use only DME 4 wheeled rolling walker with seat  Once       Comments: bariatric  Question:  Patient needs a walker to treat with the following condition  Answer:  Weakness  10/16/21 1008   10/16/21 0956  For home use only DME Bedside commode  Once       Comments: bariatric  Question:  Patient needs a bedside commode to treat with the following condition  Answer:  Weakness   10/16/21 0957   10/16/21 0849  For home use only DME oxygen  Once       Question Answer Comment  Length of Need Lifetime   Liters per Minute 4   Frequency Continuous (stationary and portable oxygen unit needed)   Oxygen delivery system Gas      10/16/21 0849            Follow-up Information     Leonie Man, MD Follow up.   Specialty: Cardiology Why: His office will call with appt date and time, if you have not heard by Wed. then please call teh office. Contact information: [place] [address] [place]. Follow up.   Why: [facility] will call you to schedule home visit Contact information: [place] [address] [phone]         Stacey Maw, MD. Schedule an appointment as soon as possible for a visit in 1 week(s).   Specialty: Family Medicine Why: CALL TO SCHEDULE PCP follow up within 1  week Contact information: [place] [address] [phone]         Leonie Man, MD .   Specialty: Cardiology Contact information: [address] [place] [address] [phone]                Discharge Exam: Danley Danker Weights   10/15/21 5053 10/15/21 1143 10/16/21 0624  Weight: (!) 150.4 kg (!) 150.4 kg (!) 151.5 kg   General; NAD Lungs: CTA  Condition at discharge: stable  The results of significant diagnostics from this hospitalization (including imaging, microbiology, ancillary and laboratory) are listed below for reference.   Imaging Studies: CARDIAC CATHETERIZATION  Result Date: 10/15/2021   Hemodynamic findings consistent with mild pulmonary hypertension. CONCLUSIONS: Mild pulmonary hypertension with mean PA pressure 28 mmHg, capillary wedge pressure mean 13 mmHg, and pulmonary vascular resistance 2.73 Wood units.  Pulmonary hypertension etiology suspected to be WHO group 3. Right atrial pressure is normal with mean of 6 mmHg. Cardiac output 5.5 L/min, index 2.17 L/min/m.   ECHOCARDIOGRAM COMPLETE  Result Date: 10/10/2021    ECHOCARDIOGRAM REPORT   Patient Name:   DEBORRAH MABIN Date of Exam: 10/10/2021 Medical Rec #:  [id number]        Height:       68.0 in Accession #:    [id number]       Weight:       360.4 lb Date of Birth:  1951-01-13       BSA:          2.625 m Patient Age:    40 years         BP:           90/51 mmHg Patient Gender: F                HR:           51 bpm. Exam Location:  Inpatient Procedure: 2D Echo, Cardiac Doppler and Color Doppler Indications:    Shock  History:        Patient has prior history of Echocardiogram  examinations, most                 recent 06/14/2021. CHF; Risk Factors:Sleep Apnea, Hypertension,                 Dyslipidemia, Diabetes and Former Smoker. CKD.  Sonographer:    Bernard White RCS Referring Phys: [id number] TIMOTHY S GEORGE IMPRESSIONS  1. Left ventricular ejection fraction, by  estimation, is 70 to 75%. The left ventricle has hyperdynamic function. The left ventricle has no regional wall motion abnormalities. There is moderate left ventricular hypertrophy. Left ventricular diastolic parameters are indeterminate.  2. Right ventricular systolic function is normal. The right ventricular size is normal. There is moderately elevated pulmonary artery systolic pressure.  3. Left atrial size was moderately dilated.  4. The mitral valve is normal in structure. No evidence of mitral valve regurgitation. No evidence of mitral stenosis. Moderate mitral annular calcification.  5. The aortic valve is calcified. There is mild calcification of the aortic valve. There is mild thickening of the aortic valve. Aortic valve regurgitation is not visualized. Moderate aortic valve stenosis. Aortic valve area, by VTI measures 1.88 cm. Aortic valve mean gradient measures 23.0 mmHg. Aortic valve Vmax measures 3.12 m/s.  6. The inferior vena cava is dilated in size with <50% respiratory variability, suggesting right atrial pressure of 15 mmHg. Comparison(s): Prior aortic valve gradient 18 mmHg. FINDINGS  Left Ventricle: Left ventricular ejection fraction, by estimation, is 70 to 75%. The left ventricle has hyperdynamic function. The left ventricle has no regional wall motion abnormalities. The left ventricular internal cavity size was normal in size. There is moderate left ventricular hypertrophy. Left ventricular diastolic parameters are indeterminate. Right Ventricle: The right ventricular size is normal. No increase in right ventricular wall thickness. Right ventricular systolic function is normal. There is moderately elevated pulmonary artery systolic pressure. The tricuspid regurgitant velocity is 2.93 m/s, and with an assumed right atrial pressure of 15 mmHg, the estimated right ventricular systolic pressure is 49.3 mmHg. Left Atrium: Left atrial size was moderately dilated. Right Atrium: Right atrial size was  normal in size. Pericardium: There is no evidence of pericardial effusion. Mitral Valve: The mitral valve is normal in structure. Moderate mitral annular calcification. No evidence of mitral valve regurgitation. No evidence of mitral valve stenosis. Tricuspid Valve: The tricuspid valve is normal in structure. Tricuspid valve regurgitation is not demonstrated. No evidence of tricuspid stenosis. Aortic Valve: The aortic valve is calcified. There is mild calcification of the aortic valve. There is mild thickening of the aortic valve. Aortic valve regurgitation is not visualized. Moderate aortic stenosis is present. Aortic valve mean gradient measures 23.0 mmHg. Aortic valve peak gradient measures 38.9 mmHg. Aortic valve area, by VTI measures 1.88 cm. Pulmonic Valve: The pulmonic valve was normal in structure. Pulmonic valve regurgitation is mild. No evidence of pulmonic stenosis. Aorta: The aortic root is normal in size and structure. Venous: The inferior vena cava is dilated in size with less than 50% respiratory variability, suggesting right atrial pressure of 15 mmHg. IAS/Shunts: No atrial level shunt detected by color flow Doppler.  LEFT VENTRICLE PLAX 2D LVIDd:         5.10 cm   Diastology LVIDs:         2.30 cm   LV e' medial:  5.38 cm/s LV PW:         1.60 cm   LV e' lateral: 5.87 cm/s LV IVS:        1.60 cm   LVOT diam:     2.10 cm LV SV:         133 LV SV Index:   51 LVOT Area:     3.46 cm  RIGHT VENTRICLE RV S prime:     12.30 cm/s TAPSE (M-mode): 2.2 cm LEFT ATRIUM              Index        RIGHT ATRIUM           Index LA diam:        5.20 cm  1.98 cm/m   RA Area:     22.30 cm LA Vol (A2C):   141.0 ml 53.71 ml/m  RA Volume:   76.50 ml  29.14 ml/m LA Vol (A4C):   96.5 ml  36.76 ml/m LA Biplane Vol: 118.0 ml 44.95 ml/m  AORTIC VALVE AV Area (Vmax):    1.74 cm AV Area (Vmean):   1.71 cm AV Area (VTI):     1.88 cm AV Vmax:           312.00 cm/s AV Vmean:          217.000 cm/s AV VTI:            0.707 m  AV Peak Grad:      38.9 mmHg AV Mean Grad:      23.0 mmHg LVOT Vmax:         157.00 cm/s LVOT Vmean:        107.000 cm/s LVOT VTI:          0.383 m LVOT/AV VTI ratio: 0.54  AORTA Ao Root diam: 3.50 cm TRICUSPID VALVE TR Peak grad:   34.3 mmHg TR Vmax:        293.00 cm/s  SHUNTS Systemic VTI:  0.38 m Systemic Diam: 2.10 cm Mark Skains MD Electronically signed by Mark Skains MD Signature Date/Time: 10/10/2021/3:40:47 PM    Final    US Venous Img Lower Bilateral  Result Date: 10/09/2021 CLINICAL DATA:  Bilateral ankle swelling. EXAM: BILATERAL LOWER EXTREMITY VENOUS DOPPLER ULTRASOUND TECHNIQUE: Gray-scale sonography with graded compression, as well as color Doppler and duplex ultrasound were performed to evaluate the lower extremity deep venous systems from the level of the common femoral vein and including the common femoral, femoral, profunda femoral, popliteal and calf veins including the posterior tibial, peroneal and gastrocnemius veins when visible. The superficial great saphenous vein was also interrogated. Spectral Doppler was utilized to evaluate flow at rest and with distal augmentation maneuvers in the common femoral, femoral and popliteal veins. COMPARISON:  None Available. FINDINGS: RIGHT LOWER EXTREMITY Common Femoral Vein: No evidence of thrombus. Normal compressibility, respiratory phasicity and response to augmentation. Saphenofemoral Junction: No evidence of thrombus. Normal compressibility and flow on color Doppler imaging. Profunda Femoral Vein: No evidence of thrombus. Normal compressibility and flow on color Doppler imaging. Femoral Vein: No evidence of thrombus. Normal compressibility, respiratory phasicity and response to augmentation. Popliteal Vein: No evidence of thrombus. Normal compressibility, respiratory phasicity and response to augmentation. Calf Veins: No evidence of thrombus. Normal flow on color Doppler imaging. Compressibility is not well assessed due to patient's body habitus.  Superficial Great Saphenous Vein: No evidence of thrombus. Normal compressibility. Venous Reflux:  None. Other Findings: Examination is limited due to patient's body habitus. Mild subcutaneous edema is noted at the ankle. LEFT LOWER EXTREMITY Common Femoral Vein: No evidence of thrombus. Normal compressibility, respiratory phasicity and response to augmentation. Saphenofemoral Junction: No evidence of thrombus. Normal compressibility   and flow on color Doppler imaging. Profunda Femoral Vein: No evidence of thrombus. Normal compressibility and flow on color Doppler imaging. Femoral Vein: No evidence of thrombus. Normal compressibility, respiratory phasicity and response to augmentation. Popliteal Vein: No evidence of thrombus. Normal compressibility, respiratory phasicity and response to augmentation. Calf Veins: No evidence of thrombus. Normal flow on color Doppler imaging. Compressibility is not well assessed due to patient's body habitus. Superficial Great Saphenous Vein: No evidence of thrombus. Normal compressibility. Venous Reflux:  None. Other Findings:  Examination limited due to patient's body habitus. IMPRESSION: No definite evidence of deep venous thrombosis in either lower extremity. Electronically Signed   By: Laura  Taylor M.D.   On: 10/09/2021 21:16   DG Chest 2 View  Result Date: 10/09/2021 CLINICAL DATA:  Shortness of breath EXAM: CHEST - 2 VIEW COMPARISON:  08/12/2020 FINDINGS: Transverse diameter of heart is increased. Central pulmonary vessels are prominent. There are no signs of alveolar pulmonary edema or focal pulmonary consolidation. Posterior costophrenic angles are clear. There is no pneumothorax. IMPRESSION: Cardiomegaly. Central pulmonary vessels are prominent suggesting CHF. There is no focal pulmonary consolidation. Electronically Signed   By: Palani  Rathinasamy M.D.   On: 10/09/2021 18:00   MM 3D SCREEN BREAST BILATERAL  Result Date: 09/30/2021 CLINICAL DATA:  Screening. EXAM:  DIGITAL SCREENING BILATERAL MAMMOGRAM WITH TOMOSYNTHESIS AND CAD TECHNIQUE: Bilateral screening digital craniocaudal and mediolateral oblique mammograms were obtained. Bilateral screening digital breast tomosynthesis was performed. The images were evaluated with computer-aided detection. COMPARISON:  Previous exam(s). ACR Breast Density Category b: There are scattered areas of fibroglandular density. FINDINGS: There are no findings suspicious for malignancy. IMPRESSION: No mammographic evidence of malignancy. A result letter of this screening mammogram will be mailed directly to the patient. RECOMMENDATION: Screening mammogram in one year. (Code:SM-B-01Y) BI-RADS CATEGORY  1: Negative. Electronically Signed   By: Steven  Reid M.D.   On: 09/30/2021 17:07    Microbiology: Results for orders placed or performed during the hospital encounter of 10/09/21  Resp Panel by RT-PCR (Flu A&B, Covid) Anterior Nasal Swab     Status: None   Collection Time: 10/09/21  5:22 PM   Specimen: Anterior Nasal Swab  Result Value Ref Range Status   SARS Coronavirus 2 by RT PCR NEGATIVE NEGATIVE Final    Comment: (NOTE) SARS-CoV-2 target nucleic acids are NOT DETECTED.  The SARS-CoV-2 RNA is generally detectable in upper respiratory specimens during the acute phase of infection. The lowest concentration of SARS-CoV-2 viral copies this assay can detect is 138 copies/mL. A negative result does not preclude SARS-Cov-2 infection and should not be used as the sole basis for treatment or other patient management decisions. A negative result may occur with  improper specimen collection/handling, submission of specimen other than nasopharyngeal swab, presence of viral mutation(s) within the areas targeted by this assay, and inadequate number of viral copies(<138 copies/mL). A negative result must be combined with clinical observations, patient history, and epidemiological information. The expected result is Negative.  Fact  Sheet for Patients:  https://www.fda.gov/media/152166/download  Fact Sheet for Healthcare Providers:  https://www.fda.gov/media/152162/download  This test is no t yet approved or cleared by the [place] FDA and  has been authorized for detection and/or diagnosis of SARS-CoV-2 by FDA under an Emergency Use Authorization (EUA). This EUA will remain  in effect (meaning this test can be used) for the duration of the COVID-19 declaration under Section 564(b)(1) of the Act, 21 U.S.C.section 360bbb-3(b)(1), unless the authorization is terminated  or revoked sooner.         Influenza A by PCR NEGATIVE NEGATIVE Final   Influenza B by PCR NEGATIVE NEGATIVE Final    Comment: (NOTE) The Xpert Xpress SARS-CoV-2/FLU/RSV plus assay is intended as an aid in the diagnosis of influenza from Nasopharyngeal swab specimens and should not be used as a sole basis for treatment. Nasal washings and aspirates are unacceptable for Xpert Xpress SARS-CoV-2/FLU/RSV testing.  Fact Sheet for Patients: EntrepreneurPulse.com.au  Fact Sheet for Healthcare Providers: IncredibleEmployment.be  This test is not yet approved or cleared by the [place] FDA and has been authorized for detection and/or diagnosis of SARS-CoV-2 by FDA under an Emergency Use Authorization (EUA). This EUA will remain in effect (meaning this test can be used) for the duration of the COVID-19 declaration under Section 564(b)(1) of the Act, 21 U.S.C. section 360bbb-3(b)(1), unless the authorization is terminated or revoked.  Performed at [facility], [address], [place], [place] [address]   MRSA Next Gen by PCR, Nasal     Status: None   Collection Time: 10/10/21 12:52 AM   Specimen: Nasal Mucosa; Nasal Swab  Result Value Ref Range Status   MRSA by PCR Next Gen NOT DETECTED NOT DETECTED Final    Comment: (NOTE) The GeneXpert MRSA Assay (FDA approved for NASAL specimens  only), is one component of a comprehensive MRSA colonization surveillance program. It is not intended to diagnose MRSA infection nor to guide or monitor treatment for MRSA infections. Test performance is not FDA approved in patients less than 1 years old. Performed at [facility], [place] [address], [place], [place] [address]     Labs: CBC: Recent Labs  Lab 10/11/21 0009 10/12/21 0022 10/13/21 3557 10/14/21 0916 10/15/21 0736 10/15/21 1304  WBC 5.2 5.7 4.2 3.6* 3.1*  --   HGB 11.3* 11.0* 11.9* 12.7 12.5 13.3  13.3  HCT 38.2 36.9 39.5 41.8 41.3 39.0  39.0  MCV 100.8* 99.7 97.5 96.5 95.4  --   PLT 143* 149* 153 172 171  --    Basic Metabolic Panel: Recent Labs  Lab 10/10/21 0533 10/10/21 0744 10/12/21 0022 10/13/21 0852 10/13/21 0922 10/14/21 0916 10/15/21 0736 10/15/21 1304 10/16/21 0354  NA  --    < > 142 142  --  140 141 140  139 138  K  --    < > 4.6 4.0  --  3.4* 3.8 3.8  3.8 3.9  CL  --    < > 104 95*  --  93* 92*  --  93*  CO2  --    < > 31 38*  --  39* 38*  --  34*  GLUCOSE  --    < > 110* 183*  --  151* 151*  --  120*  BUN  --    < > 47* 36*  --  37* 38*  --  44*  CREATININE  --    < > 2.76* 2.20*  --  2.50* 2.33*  --  2.40*  CALCIUM  --    < > 8.9 9.5  --  9.2 9.7  --  9.2  MG 2.5*  --   --   --  1.6* 1.7  --   --  2.1   < > = values in this interval not displayed.   Liver Function Tests: No results for input(s): "AST", "ALT", "ALKPHOS", "BILITOT", "PROT", "ALBUMIN" in the last 168 hours. CBG: Recent Labs  Lab 10/15/21 0639 10/15/21 1136 10/15/21 1623 10/15/21 2113 10/16/21 3220  GLUCAP  124* 116* 153* 137* 151*    Discharge time spent: greater than 30 minutes.  Signed: Belkys A Regalado, MD [facility] 10/16/2021 

## 2021-10-16 NOTE — Discharge Instructions (Signed)
Follow 1 .8 L fluid restriction in 24 hours Weight yourself every day, if your weight increases more than 3 pound in 24 hours, inform your cardiology, you will need extra dose of lasix.

## 2021-10-18 ENCOUNTER — Ambulatory Visit (INDEPENDENT_AMBULATORY_CARE_PROVIDER_SITE_OTHER): Payer: No Typology Code available for payment source | Admitting: Adult Health

## 2021-10-18 ENCOUNTER — Encounter (HOSPITAL_COMMUNITY): Payer: Self-pay | Admitting: Interventional Cardiology

## 2021-10-18 VITALS — BP 119/71 | HR 66 | Temp 97.9°F | Ht 68.0 in | Wt 333.0 lb

## 2021-10-18 DIAGNOSIS — I130 Hypertensive heart and chronic kidney disease with heart failure and stage 1 through stage 4 chronic kidney disease, or unspecified chronic kidney disease: Secondary | ICD-10-CM

## 2021-10-18 DIAGNOSIS — E785 Hyperlipidemia, unspecified: Secondary | ICD-10-CM | POA: Diagnosis not present

## 2021-10-18 DIAGNOSIS — E8729 Other acidosis: Secondary | ICD-10-CM

## 2021-10-18 DIAGNOSIS — I5033 Acute on chronic diastolic (congestive) heart failure: Secondary | ICD-10-CM | POA: Diagnosis not present

## 2021-10-18 DIAGNOSIS — J449 Chronic obstructive pulmonary disease, unspecified: Secondary | ICD-10-CM

## 2021-10-18 DIAGNOSIS — E1169 Type 2 diabetes mellitus with other specified complication: Secondary | ICD-10-CM

## 2021-10-18 DIAGNOSIS — D509 Iron deficiency anemia, unspecified: Secondary | ICD-10-CM

## 2021-10-18 DIAGNOSIS — Z9989 Dependence on other enabling machines and devices: Secondary | ICD-10-CM

## 2021-10-18 DIAGNOSIS — G4733 Obstructive sleep apnea (adult) (pediatric): Secondary | ICD-10-CM

## 2021-10-18 DIAGNOSIS — N179 Acute kidney failure, unspecified: Secondary | ICD-10-CM

## 2021-10-18 DIAGNOSIS — Z7984 Long term (current) use of oral hypoglycemic drugs: Secondary | ICD-10-CM

## 2021-10-18 DIAGNOSIS — N184 Chronic kidney disease, stage 4 (severe): Secondary | ICD-10-CM

## 2021-10-18 DIAGNOSIS — Z6841 Body Mass Index (BMI) 40.0 and over, adult: Secondary | ICD-10-CM

## 2021-10-18 DIAGNOSIS — E876 Hypokalemia: Secondary | ICD-10-CM

## 2021-10-18 LAB — LIPOPROTEIN A (LPA): Lipoprotein (a): 118.3 nmol/L — ABNORMAL HIGH (ref <75.0)

## 2021-10-19 ENCOUNTER — Encounter: Payer: Self-pay | Admitting: Family Medicine

## 2021-10-19 ENCOUNTER — Ambulatory Visit: Payer: No Typology Code available for payment source | Admitting: Nurse Practitioner

## 2021-10-19 ENCOUNTER — Encounter: Payer: No Typology Code available for payment source | Admitting: Family Medicine

## 2021-10-19 ENCOUNTER — Ambulatory Visit (INDEPENDENT_AMBULATORY_CARE_PROVIDER_SITE_OTHER): Payer: No Typology Code available for payment source | Admitting: Family Medicine

## 2021-10-19 VITALS — BP 128/66 | HR 63 | Temp 97.8°F | Ht 68.0 in | Wt 335.8 lb

## 2021-10-19 DIAGNOSIS — G4733 Obstructive sleep apnea (adult) (pediatric): Secondary | ICD-10-CM

## 2021-10-19 DIAGNOSIS — I110 Hypertensive heart disease with heart failure: Secondary | ICD-10-CM

## 2021-10-19 DIAGNOSIS — I5032 Chronic diastolic (congestive) heart failure: Secondary | ICD-10-CM

## 2021-10-19 DIAGNOSIS — N184 Chronic kidney disease, stage 4 (severe): Secondary | ICD-10-CM

## 2021-10-19 DIAGNOSIS — Z09 Encounter for follow-up examination after completed treatment for conditions other than malignant neoplasm: Secondary | ICD-10-CM

## 2021-10-19 NOTE — Progress Notes (Signed)
Established Patient Office Visit  Subjective   Patient ID: Stacey Page, female    DOB: [birth year]  Age: 71 y.o. MRN: [id number]  Chief Complaint  Patient presents with   [facility] follow up no concerns.    HPI for hospital discharge follow-up for congestive heart failure.  Right heart cath revealed mild pulmonary hypertension and significant aortic valve stenosis.  There were nonobstructing lesions in the LAD.  She has follow-up with the cardiology for consideration of TAVR.  Ongoing CKD.  GFR is averaging in the low 20s.  She is still producing urine.  History of sleep apnea.  She is using her CPAP nightly.  It has improved proved her sleep quality.  She is planning to follow-up with pulmonology for recheck.  Much improved after her hospitalization.  She denies dyspnea or chest pain.  She is sleeping on 1 pillow.  She is accompanied by her daughter today.    Review of Systems  Constitutional:  Negative for chills, diaphoresis, malaise/fatigue and weight loss.  HENT: Negative.    Eyes: Negative.  Negative for blurred vision and double vision.  Respiratory:  Negative for cough and shortness of breath.   Cardiovascular:  Negative for chest pain.  Gastrointestinal:  Negative for abdominal pain.  Genitourinary: Negative.   Musculoskeletal:  Negative for falls and myalgias.  Neurological:  Negative for speech change, loss of consciousness and weakness.  Psychiatric/Behavioral: Negative.        Objective:     BP 128/66 (BP Location: Left Arm, Patient Position: Sitting, Cuff Size: Large)   Pulse 63   Temp 97.8 F (36.6 C) (Temporal)   Ht '5\' 8"'$  (1.727 m)   Wt (!) 335 lb 12.8 oz (152.3 kg)   SpO2 95%   BMI 51.06 kg/m    Physical Exam Constitutional:      General: She is not in acute distress.    Appearance: Normal appearance. She is not ill-appearing, toxic-appearing or diaphoretic.  HENT:     Head: Normocephalic and atraumatic.     Right Ear: External ear  normal.     Left Ear: External ear normal.     Mouth/Throat:     Mouth: Mucous membranes are moist.     Pharynx: Oropharynx is clear. No oropharyngeal exudate or posterior oropharyngeal erythema.  Eyes:     General: No scleral icterus.       Right eye: No discharge.        Left eye: No discharge.     Extraocular Movements: Extraocular movements intact.     Conjunctiva/sclera: Conjunctivae normal.     Pupils: Pupils are equal, round, and reactive to light.  Cardiovascular:     Rate and Rhythm: Normal rate and regular rhythm.  Pulmonary:     Effort: Pulmonary effort is normal. No respiratory distress.     Breath sounds: Normal breath sounds. No wheezing, rhonchi or rales.  Musculoskeletal:     Cervical back: No rigidity or tenderness.     Right lower leg: No edema.     Left lower leg: No edema.  Skin:    General: Skin is warm and dry.  Neurological:     Mental Status: She is alert and oriented to person, place, and time.  Psychiatric:        Mood and Affect: Mood normal.        Behavior: Behavior normal.      No results found for any visits on 10/19/21.    The  ASCVD Risk score (Arnett DK, et al., 2019) failed to calculate for the following reasons:   The valid total cholesterol range is 130 to 320 mg/dL    Assessment & Plan:   Problem List Items Addressed This Visit       Cardiovascular and Mediastinum   Hypertensive heart disease with chronic diastolic congestive heart failure (HCC) (Chronic)     Respiratory   OSA (obstructive sleep apnea) (Chronic)   Other Visit Diagnoses     Hospital discharge follow-up    -  Primary   Stage 4 chronic kidney disease ([place])           Return in about 3 months (around 01/18/2022).  Patient has follow-up scheduled with cardiology and nephrology.  Labs drawn by nephrology next week.  She will call pulmonology for an appointment to follow-up for her  [name], MD

## 2021-10-20 NOTE — Progress Notes (Signed)
Chief Complaint:   OBESITY Stacey Page is here to discuss her progress with her obesity treatment plan along with follow-up of her obesity related diagnoses. Stacey Page is on the Category 2 Plan and states she is following her eating plan approximately 25% of the time. Stacey Page states she is walking and chair exercises 5 minutes 5 times per week.  Today's visit was #: 17 Starting weight: 344 lbs Starting date: 03/04/2021 Today's weight: 333 lbs Today's date: 10/18/2021 Total lbs lost to date: 11 lbs Total lbs lost since last in-office visit: 22 lbs  Interim History:  10/09/2021 Hospital Admission for Respiratory Acidosis.  Reviewed Admission notes, labs, imaging with pt.  She will follow up with PCP, Dr Ethelene Hal on 09/12 and Cardiology, Dr. Ellyn Hack on 10/29/21.  Subjective:   1. Type 2 diabetes mellitus with other specified complication, without long-term current use of insulin (HCC)  Her home fasting blood glucose ranges between 110-120's. She has been on various doses of Januvia since 2010- started by her PCP in [place].  She is currently on Januvia '25mg'$  QD.  She denies family hx of MTC or MENS 2.  She denies personal hx of pancreatitis. Discussed risk and benefits of a GLP-1 therapy, and she defers at this time. Lab Results  Component Value Date   HGBA1C 6.0 (A) 07/14/2021   HGBA1C 6.4 (A) 04/14/2021   HGBA1C 6.1 (H) 10/14/2020    2. Respiratory acidosis Hospital D/C Notes: Assessment and Plan: 1-Acute Hypoxic, Hypercapnic respiratory failure Initially required BiPAP. Using CPAP Venous gas PCO2 at 71.  Wean off of oxygen as tolerated Treated  with IV Lasix In the setting of acute on chronic diastolic heart failure exacerbation, history of COPD.  Pulmonologist consulted, they will help arrange follow up. Might need repeat sleep study to adjust CPAP setting.  She will be discharge on Oxygen 4 L.    2-Acute on chronic diastolic heart failure exacerbation: Secondary to  medication noncompliance. Treated  with IV Lasix -9 L Weight: 360---337--331--334 Appreciate cardiology assistance Right side heart cath.  RAP 6 mm, PW CP 13 mm, PVR 2.7 Patient  was transitioned to oral Lasix 9/8 Discharge on 80 mg lasix daily.    AKI on CKD stage IV;  Creatinine baseline 1.8--2.05 Presents with Cr 3.3 Monitor on lasix.  Cr stable 2.3 Good urine out put.    Elevated Troponin; 202---225---224.   Primary HTN;  Continue with hydralazine, Imdur.  Holder parameter for Hypotension.    COPD;  Continue with duoneb   OSA: Continue with CPAP Aortic stenosis: Moderate stenosis. Cardiology following.    Hyperlipidemia: Continue Lipitor   Normocytic  anemia:  hemoglobin stable   Diabetes type 2: Continue with sliding scale   Hypomagnesemia; Replete.  Hypokalemia; Replaced.    Iron deficiency;  Iron 41.  Hb at 12.  Started oral iron.    Morbid-obesity. Needs lifestyle modification.  BMI 50   Assessment/Plan:   1. Type 2 diabetes mellitus with other specified complication, without long-term current use of insulin (HCC) Continue daily Januvia '25mg'$  QD.   2. Respiratory acidosis Follow up PCP and cardiologist as directed.    3. Obesity, current BMI 50.7 Stacey Page is currently in the action stage of change. As such, her goal is to continue with weight loss efforts. She has agreed to the Category 2 Plan.   Exercise goals:  As is.   Behavioral modification strategies: increasing lean protein intake, decreasing simple carbohydrates, meal planning and cooking strategies, keeping healthy foods  in the home, and planning for success.  Stacey Page has agreed to follow-up with our clinic in 4 weeks. She was informed of the importance of frequent follow-up visits to maximize her success with intensive lifestyle modifications for her multiple health conditions.   Objective:   Blood pressure 119/71, pulse 66, temperature 97.9 F (36.6 C), height '5\' 8"'$  (1.727 m),  weight (!) 333 lb (151 kg), SpO2 90 %. Body mass index is 50.63 kg/m.  General: Cooperative, alert, well developed, in no acute distress. HEENT: Conjunctivae and lids unremarkable. Cardiovascular: Regular rhythm.  Lungs: Normal work of breathing. Neurologic: No focal deficits.   Lab Results  Component Value Date   CREATININE 2.40 (H) 10/16/2021   BUN 44 (H) 10/16/2021   NA 138 10/16/2021   K 3.9 10/16/2021   CL 93 (L) 10/16/2021   CO2 34 (H) 10/16/2021   Lab Results  Component Value Date   ALT 7 07/14/2021   AST 10 (L) 07/14/2021   ALKPHOS 65 07/14/2021   BILITOT 0.3 07/14/2021   Lab Results  Component Value Date   HGBA1C 6.0 (A) 07/14/2021   HGBA1C 6.4 (A) 04/14/2021   HGBA1C 6.1 (H) 10/14/2020   HGBA1C 6.1 05/14/2020   HGBA1C 6.0 (H) 10/28/2019   Lab Results  Component Value Date   INSULIN 8.6 10/28/2019   INSULIN 9.2 07/04/2019   INSULIN 8.8 03/25/2019   Lab Results  Component Value Date   TSH 0.51 11/28/2018   Lab Results  Component Value Date   CHOL 116 05/14/2020   HDL 51.60 05/14/2020   LDLCALC 52 05/14/2020   TRIG 61.0 05/14/2020   CHOLHDL 2 05/14/2020   Lab Results  Component Value Date   VD25OH 57.6 10/28/2019   VD25OH 41.5 07/04/2019   VD25OH 38.2 03/25/2019   Lab Results  Component Value Date   WBC 3.1 (L) 10/15/2021   HGB 13.3 10/15/2021   HGB 13.3 10/15/2021   HCT 39.0 10/15/2021   HCT 39.0 10/15/2021   MCV 95.4 10/15/2021   PLT 171 10/15/2021   Lab Results  Component Value Date   IRON 41 10/14/2021   TIBC 279 10/14/2021   FERRITIN 51 10/14/2021    Attestation Statements:   Reviewed by clinician on day of visit: allergies, medications, problem list, medical history, surgical history, family history, social history, and previous encounter notes.  Time spent on visit including pre-visit chart review and post-visit care and charting was 27 minutes.   I, Davy Pique, RMA, am acting as Location manager for Mina Marble,  NP.  I have reviewed the above documentation for accuracy and completeness, and I agree with the above. -  Champagne Paletta d. Diogo Anne, NP-C

## 2021-10-21 DIAGNOSIS — N184 Chronic kidney disease, stage 4 (severe): Secondary | ICD-10-CM | POA: Diagnosis not present

## 2021-10-23 ENCOUNTER — Other Ambulatory Visit: Payer: Self-pay | Admitting: Nurse Practitioner

## 2021-10-25 NOTE — Telephone Encounter (Signed)
Chart supports Rx Last OV: 10/2021 Next OV: 11/2021

## 2021-10-29 ENCOUNTER — Ambulatory Visit (INDEPENDENT_AMBULATORY_CARE_PROVIDER_SITE_OTHER): Payer: No Typology Code available for payment source | Admitting: Adult Health

## 2021-10-29 ENCOUNTER — Telehealth: Payer: Self-pay | Admitting: *Deleted

## 2021-10-29 ENCOUNTER — Encounter: Payer: Self-pay | Admitting: Adult Health

## 2021-10-29 DIAGNOSIS — J9602 Acute respiratory failure with hypercapnia: Secondary | ICD-10-CM

## 2021-10-29 DIAGNOSIS — G4733 Obstructive sleep apnea (adult) (pediatric): Secondary | ICD-10-CM | POA: Diagnosis not present

## 2021-10-29 DIAGNOSIS — N179 Acute kidney failure, unspecified: Secondary | ICD-10-CM

## 2021-10-29 DIAGNOSIS — I5033 Acute on chronic diastolic (congestive) heart failure: Secondary | ICD-10-CM

## 2021-10-29 DIAGNOSIS — N182 Chronic kidney disease, stage 2 (mild): Secondary | ICD-10-CM

## 2021-10-29 NOTE — Progress Notes (Signed)
Reviewed and agree with assessment/plan.   Chesley Mires, MD [facility] Pulmonary/Critical Care 10/29/2021, 2:38 PM Pager:  [phone]

## 2021-10-29 NOTE — Assessment & Plan Note (Signed)
Recent hospitalization with decompensated heart failure.  Patient is continue follow-up with cardiology.  Continue on current maintenance regimen. Does appear to be improved and appears euvolemic on exam.  Continue with daily weights. Continue on Lasix  Plan  Patient Instructions  Restart Oxygen 2l/m , goal is to keep Oxygen >88-90%.  Continue on CPAP At bedtime   CPAP download .  Activity as tolerated  Continue on Lasix  Follow up with .Dr. Halford Chessman  in 2 weeks and As needed  Please contact office for sooner follow up if symptoms do not improve or worsen or seek emergency care

## 2021-10-29 NOTE — Progress Notes (Signed)
'@Patient'  ID: Stacey Page, female    DOB: Dec 15, 1950, 71 y.o.   MRN: [id number]  Chief Complaint  Patient presents with   Hospitalization Follow-up    Referring provider: [name]  HPI: 71 year old female former smoker followed for asthma, sleep apnea and history of MAI History significant for chronic diastolic heart failure and aortic valve stenosis, chronic kidney disease, diabetes  TEST/EVENTS :  Echo 01/28/14 >> severe LVH, EF 65 to 48%, grade 2 diastolic dysfx, mild AS, mild/mod LA. HST 02/12/14 >> AHI 59.3, SaO2 low 67% Auto CPAP 03/07/14 to 03/09/14 >> used on 3 of 3 nights with average 8 hrs and 8 min.  Average AHI is 3.3 with median CPAP 12 cm H2O and 95 th percentile CPAP 14 cm H20. PFT 03/12/14 >> FEV1 2.05 (88%), FEV1% 87, TLC 4.14 (75%), DLCO 52%, no BD HRCT chest 03/20/14 >> multiple small nodules, patchy GGO, mild BTX, air trapping  10/29/2021 Follow up : Asthma, sleep apnea, post hospital follow-up Patient was recently hospitalized earlier this month for acute on chronic congestive heart failure with preserved EF.  Patient was admitted with acute hypercarbic and hypoxic respiratory failure with mental status changes.  She required additional BiPAP support.  She was aggressively diuresed.  She was managed by cardiology underwent a right heart cath, showed mild pulmonary hypertension.  WHO group 3 .  Venous Dopplers were negative.  She was discharged on Lasix 80 mg daily.  She does have chronic kidney disease with baseline creatinine at 1.8-2.05.  She was discharged on oxygen 4 L.  Patient says since discharge she is feeling much better shortness of breath has decreased.  Leg swelling has been much less.  Her weight is remained down.  She is currently at 337 pounds.  Unfortunately patient did not start oxygen.  She says she felt that she did not needed she was feeling better.  When the DME company brought out the concentrator she declined it and sent back the  portable oxygen tanks.  She did not understand the potential complications of hypoxemia.  Patient says at home her oxygen levels most this time stay above 86 to 87% on rest.  But do drop down into the low 80s with walking.  Patient education was given regarding her oxygen levels and potential complications of hypoxemia. Patient remains on CPAP at bedtime.  Patient says she wears it every single night.  CPAP download was requested. Patient denies any chest pain orthopnea PND.      No Known Allergies  Immunization History  Administered Date(s) Administered   Fluad Quad(high Dose 65+) 11/28/2018, 01/29/2020   Influenza Split 11/21/2016   Influenza,inj,Quad PF,6+ Mos 01/12/2015, 11/19/2015   PFIZER(Purple Top)SARS-COV-2 Vaccination 03/31/2019, 04/23/2019, 03/21/2020   Pneumococcal Conjugate-13 01/19/2017   Pneumococcal Polysaccharide-23 01/12/2015, 05/14/2020   Zoster Recombinat (Shingrix) 07/14/2021    Past Medical History:  Diagnosis Date   Anxiety    doesn't take any meds   Arthritis of right knee 03/14/2016   Asthma    Back pain    Chronic diastolic CHF (congestive heart failure) ([place])    HF with Preserved EF (60-65%) - Grade II Diastolic Dysfunction (Hypertensive Heart Disease). takes Furosemide daily   Depression    doesn't take meds   Diabetes ([place])    takes Januvia daily   Eczema    uses cream as needed   GERD (gastroesophageal reflux disease)    History of bronchitis as a child    HTN (hypertension)  Insomnia    Mild aortic stenosis by prior echocardiogram 04/2017   Mild stenosis: Mean gradient 15 mmHg, peak gradient 28 mmHg   Mycobacterium avium-intracellulare infection ([place]) 2016   OA (osteoarthritis)    Obese    OSA (obstructive sleep apnea) 02/25/2014   wears CPAP at night   Peripheral neuropathy    takes Gabapentin as needed   Pneumonia    hx of-2010   Seasonal allergies    uses Flonase daily   Sleep apnea     Tobacco History: Social History    Tobacco Use  Smoking Status Former   Packs/day: 0.25   Years: 15.00   Total pack years: 3.75   Types: Cigarettes   Quit date: 02/08/1980   Years since quitting: 41.7  Smokeless Tobacco Former   Counseling given: Not Answered   Outpatient Medications Prior to Visit  Medication Sig Dispense Refill   albuterol (VENTOLIN HFA) 108 (90 Base) MCG/ACT inhaler INHALE 1-2 PUFFS BY MOUTH into THE lungs every SIX hours AS NEEDED FOR WHEEZING AND/OR SHORTNESS OF BREATH 8.5 g 3   aspirin 81 MG tablet Take 1 tablet (81 mg total) by mouth daily with breakfast. 90 tablet 0   Blood Glucose Monitoring Suppl (ONETOUCH VERIO SYNC SYSTEM) w/Device KIT 1 kit by Does not apply route daily as needed. 1 kit 0   carvedilol (COREG) 25 MG tablet Take 1 tablet (25 mg total) by mouth 2 (two) times daily. 180 tablet 3   cholecalciferol (VITAMIN D3) 25 MCG (1000 UNIT) tablet Take 2,000 Units by mouth daily.     Continuous Blood Gluc Sensor (FREESTYLE LIBRE 14 DAY SENSOR) MISC Place one sensor on the skin every 14 days to monitor blood sugars continuously as directed. 6 each 3   Docusate Sodium 100 MG capsule Take 100 mg by mouth 2 (two) times daily.     ferrous sulfate 325 (65 FE) MG tablet Take 325 mg by mouth 2 (two) times daily with a meal.     fluticasone (FLONASE) 50 MCG/ACT nasal spray instill ONE SPRAY in each nostril daily (Patient taking differently: Place 1 spray into both nostrils daily.) 16 g 3   furosemide (LASIX) 80 MG tablet Take 1 tablet (80 mg total) by mouth daily. 30 tablet 6   gabapentin (NEURONTIN) 100 MG capsule Take 1 capsule (100 mg total) by mouth 2 (two) times daily. 180 capsule 1   glucose blood (ONETOUCH VERIO) test strip Use to monitor blood sugar twice daily as instructed 200 each 3   hydrALAZINE (APRESOLINE) 50 MG tablet Take 1 tablet (50 mg total) by mouth every 8 (eight) hours. 90 tablet 3   isosorbide dinitrate (ISORDIL) 10 MG tablet Take 1 tablet (10 mg total) by mouth 3 (three) times  daily. 90 tablet 3   JANUVIA 25 MG tablet TAKE ONE TABLET BY MOUTH ONCE DAILY (Patient taking differently: Take 25 mg by mouth daily.) 30 tablet 0   montelukast (SINGULAIR) 10 MG tablet Take 1 tablet (10 mg total) by mouth at bedtime. 90 tablet 3   OneTouch Delica Lancets 67H MISC Use to monitor blood sugars once daily as directed 100 each 11   pantoprazole (PROTONIX) 40 MG tablet Take 1 tablet (40 mg total) by mouth daily. 30 tablet 0   atorvastatin (LIPITOR) 20 MG tablet TAKE 1 TABLET BY MOUTH  DAILY AT 6 PM. (Patient not taking: Reported on 10/29/2021) 90 tablet 3   No facility-administered medications prior to visit.     Review of  Systems:   Constitutional:   No  weight loss, night sweats,  Fevers, chills,  +fatigue, or  lassitude.  HEENT:   No headaches,  Difficulty swallowing,  Tooth/dental problems, or  Sore throat,                No sneezing, itching, ear ache, nasal congestion, post nasal drip,   CV:  No chest pain,  Orthopnea, PND,  anasarca, dizziness, palpitations, syncope.   GI  No heartburn, indigestion, abdominal pain, nausea, vomiting, diarrhea, change in bowel habits, loss of appetite, bloody stools.   Resp: No shortness of breath with exertion or at rest.  No excess mucus, no productive cough,  No non-productive cough,  No coughing up of blood.  No change in color of mucus.  No wheezing.  No chest wall deformity  Skin: no rash or lesions.  GU: no dysuria, change in color of urine, no urgency or frequency.  No flank pain, no hematuria   MS:  No joint pain or swelling.  No decreased range of motion.  No back pain.    Physical Exam  BP 130/80 (BP Location: Left Arm, Patient Position: Sitting, Cuff Size: Large)   Pulse 69   Temp 98 F (36.7 C) (Oral)   Ht '5\' 8"'  (1.727 m)   Wt (!) 337 lb 6.4 oz (153 kg)   SpO2 93% Comment: 74% when she walked to the room.  BMI 51.30 kg/m   GEN: A/Ox3; pleasant , NAD, well nourished    HEENT:  [place]/AT,   NOSE-clear,  THROAT-clear, no lesions, no postnasal drip or exudate noted.   NECK:  Supple w/ fair ROM; no JVD; normal carotid impulses w/o bruits; no thyromegaly or nodules palpated; no lymphadenopathy.    RESP  Clear  P & A; w/o, wheezes/ rales/ or rhonchi. no accessory muscle use, no dullness to percussion  CARD:  RRR, no m/r/g, tr  peripheral edema, pulses intact, no cyanosis or clubbing.  GI:   Soft & nt; nml bowel sounds; no organomegaly or masses detected.   Musco: Warm bil, no deformities or joint swelling noted.   Neuro: alert, no focal deficits noted.    Skin: Warm, no lesions or rashes        BNP   Imaging: CARDIAC CATHETERIZATION  Result Date: 10/15/2021   Hemodynamic findings consistent with mild pulmonary hypertension. CONCLUSIONS: Mild pulmonary hypertension with mean PA pressure 28 mmHg, capillary wedge pressure mean 13 mmHg, and pulmonary vascular resistance 2.73 Wood units.  Pulmonary hypertension etiology suspected to be WHO group 3. Right atrial pressure is normal with mean of 6 mmHg. Cardiac output 5.5 L/min, index 2.17 L/min/m.   ECHOCARDIOGRAM COMPLETE  Result Date: 10/10/2021    ECHOCARDIOGRAM REPORT   Patient Name:   LEYANNA BITTMAN Date of Exam: 10/10/2021 Medical Rec #:  [id number]        Height:       68.0 in Accession #:    [id number]       Weight:       360.4 lb Date of Birth:  1950-02-15       BSA:          2.625 m Patient Age:    68 years         BP:           90/51 mmHg Patient Gender: F                HR:  51 bpm. Exam Location:  Inpatient Procedure: 2D Echo, Cardiac Doppler and Color Doppler Indications:    Shock  History:        Patient has prior history of Echocardiogram examinations, most                 recent 06/14/2021. CHF; Risk Factors:Sleep Apnea, Hypertension,                 Dyslipidemia, Diabetes and Former Smoker. CKD.  Sonographer:    Alvino Chapel RCS Referring Phys: [id number] [place]  1. Left ventricular ejection fraction,  by estimation, is 70 to 75%. The left ventricle has hyperdynamic function. The left ventricle has no regional wall motion abnormalities. There is moderate left ventricular hypertrophy. Left ventricular diastolic parameters are indeterminate.  2. Right ventricular systolic function is normal. The right ventricular size is normal. There is moderately elevated pulmonary artery systolic pressure.  3. Left atrial size was moderately dilated.  4. The mitral valve is normal in structure. No evidence of mitral valve regurgitation. No evidence of mitral stenosis. Moderate mitral annular calcification.  5. The aortic valve is calcified. There is mild calcification of the aortic valve. There is mild thickening of the aortic valve. Aortic valve regurgitation is not visualized. Moderate aortic valve stenosis. Aortic valve area, by VTI measures 1.88 cm. Aortic valve mean gradient measures 23.0 mmHg. Aortic valve Vmax measures 3.12 m/s.  6. The inferior vena cava is dilated in size with <50% respiratory variability, suggesting right atrial pressure of 15 mmHg. Comparison(s): Prior aortic valve gradient 18 mmHg. FINDINGS  Left Ventricle: Left ventricular ejection fraction, by estimation, is 70 to 75%. The left ventricle has hyperdynamic function. The left ventricle has no regional wall motion abnormalities. The left ventricular internal cavity size was normal in size. There is moderate left ventricular hypertrophy. Left ventricular diastolic parameters are indeterminate. Right Ventricle: The right ventricular size is normal. No increase in right ventricular wall thickness. Right ventricular systolic function is normal. There is moderately elevated pulmonary artery systolic pressure. The tricuspid regurgitant velocity is 2.93 m/s, and with an assumed right atrial pressure of 15 mmHg, the estimated right ventricular systolic pressure is 09.7 mmHg. Left Atrium: Left atrial size was moderately dilated. Right Atrium: Right atrial size  was normal in size. Pericardium: There is no evidence of pericardial effusion. Mitral Valve: The mitral valve is normal in structure. Moderate mitral annular calcification. No evidence of mitral valve regurgitation. No evidence of mitral valve stenosis. Tricuspid Valve: The tricuspid valve is normal in structure. Tricuspid valve regurgitation is not demonstrated. No evidence of tricuspid stenosis. Aortic Valve: The aortic valve is calcified. There is mild calcification of the aortic valve. There is mild thickening of the aortic valve. Aortic valve regurgitation is not visualized. Moderate aortic stenosis is present. Aortic valve mean gradient measures 23.0 mmHg. Aortic valve peak gradient measures 38.9 mmHg. Aortic valve area, by VTI measures 1.88 cm. Pulmonic Valve: The pulmonic valve was normal in structure. Pulmonic valve regurgitation is mild. No evidence of pulmonic stenosis. Aorta: The aortic root is normal in size and structure. Venous: The inferior vena cava is dilated in size with less than 50% respiratory variability, suggesting right atrial pressure of 15 mmHg. IAS/Shunts: No atrial level shunt detected by color flow Doppler.  LEFT VENTRICLE PLAX 2D LVIDd:         5.10 cm   Diastology LVIDs:         2.30 cm   LV e'  medial:  5.38 cm/s LV PW:         1.60 cm   LV e' lateral: 5.87 cm/s LV IVS:        1.60 cm LVOT diam:     2.10 cm LV SV:         133 LV SV Index:   51 LVOT Area:     3.46 cm  RIGHT VENTRICLE RV S prime:     12.30 cm/s TAPSE (M-mode): 2.2 cm LEFT ATRIUM              Index        RIGHT ATRIUM           Index LA diam:        5.20 cm  1.98 cm/m   RA Area:     22.30 cm LA Vol (A2C):   141.0 ml 53.71 ml/m  RA Volume:   76.50 ml  29.14 ml/m LA Vol (A4C):   96.5 ml  36.76 ml/m LA Biplane Vol: 118.0 ml 44.95 ml/m  AORTIC VALVE AV Area (Vmax):    1.74 cm AV Area (Vmean):   1.71 cm AV Area (VTI):     1.88 cm AV Vmax:           312.00 cm/s AV Vmean:          217.000 cm/s AV VTI:             0.707 m AV Peak Grad:      38.9 mmHg AV Mean Grad:      23.0 mmHg LVOT Vmax:         157.00 cm/s LVOT Vmean:        107.000 cm/s LVOT VTI:          0.383 m LVOT/AV VTI ratio: 0.54  AORTA Ao Root diam: 3.50 cm TRICUSPID VALVE TR Peak grad:   34.3 mmHg TR Vmax:        293.00 cm/s  SHUNTS Systemic VTI:  0.38 m Systemic Diam: 2.10 cm Candee Furbish MD Electronically signed by Candee Furbish MD Signature Date/Time: 10/10/2021/3:40:47 PM    Final    US Venous Img Lower Bilateral  Result Date: 10/09/2021 CLINICAL DATA:  Bilateral ankle swelling. EXAM: BILATERAL LOWER EXTREMITY VENOUS DOPPLER ULTRASOUND TECHNIQUE: Gray-scale sonography with graded compression, as well as color Doppler and duplex ultrasound were performed to evaluate the lower extremity deep venous systems from the level of the common femoral vein and including the common femoral, femoral, profunda femoral, popliteal and calf veins including the posterior tibial, peroneal and gastrocnemius veins when visible. The superficial great saphenous vein was also interrogated. Spectral Doppler was utilized to evaluate flow at rest and with distal augmentation maneuvers in the common femoral, femoral and popliteal veins. COMPARISON:  None Available. FINDINGS: RIGHT LOWER EXTREMITY Common Femoral Vein: No evidence of thrombus. Normal compressibility, respiratory phasicity and response to augmentation. Saphenofemoral Junction: No evidence of thrombus. Normal compressibility and flow on color Doppler imaging. Profunda Femoral Vein: No evidence of thrombus. Normal compressibility and flow on color Doppler imaging. Femoral Vein: No evidence of thrombus. Normal compressibility, respiratory phasicity and response to augmentation. Popliteal Vein: No evidence of thrombus. Normal compressibility, respiratory phasicity and response to augmentation. Calf Veins: No evidence of thrombus. Normal flow on color Doppler imaging. Compressibility is not well assessed due to patient's body  habitus. Superficial Great Saphenous Vein: No evidence of thrombus. Normal compressibility. Venous Reflux:  None. Other Findings: Examination is limited due to patient's body habitus.  Mild subcutaneous edema is noted at the ankle. LEFT LOWER EXTREMITY Common Femoral Vein: No evidence of thrombus. Normal compressibility, respiratory phasicity and response to augmentation. Saphenofemoral Junction: No evidence of thrombus. Normal compressibility and flow on color Doppler imaging. Profunda Femoral Vein: No evidence of thrombus. Normal compressibility and flow on color Doppler imaging. Femoral Vein: No evidence of thrombus. Normal compressibility, respiratory phasicity and response to augmentation. Popliteal Vein: No evidence of thrombus. Normal compressibility, respiratory phasicity and response to augmentation. Calf Veins: No evidence of thrombus. Normal flow on color Doppler imaging. Compressibility is not well assessed due to patient's body habitus. Superficial Great Saphenous Vein: No evidence of thrombus. Normal compressibility. Venous Reflux:  None. Other Findings:  Examination limited due to patient's body habitus. IMPRESSION: No definite evidence of deep venous thrombosis in either lower extremity. Electronically Signed   By: Brett Fairy M.D.   On: 10/09/2021 21:16   DG Chest 2 View  Result Date: 10/09/2021 CLINICAL DATA:  Shortness of breath EXAM: CHEST - 2 VIEW COMPARISON:  08/12/2020 FINDINGS: Transverse diameter of heart is increased. Central pulmonary vessels are prominent. There are no signs of alveolar pulmonary edema or focal pulmonary consolidation. Posterior costophrenic angles are clear. There is no pneumothorax. IMPRESSION: Cardiomegaly. Central pulmonary vessels are prominent suggesting CHF. There is no focal pulmonary consolidation. Electronically Signed   By: Elmer Picker M.D.   On: 10/09/2021 18:00         Latest Ref Rng & Units 03/12/2014    8:43 AM  PFT Results  FVC-Pre L 2.37    FVC-Predicted Pre % 80   FVC-Post L 2.35   FVC-Predicted Post % 79   Pre FEV1/FVC % % 87   Post FEV1/FCV % % 87   FEV1-Pre L 2.05   FEV1-Predicted Pre % 88   FEV1-Post L 2.05   DLCO uncorrected ml/min/mmHg 14.84   DLCO UNC% % 52   DLVA Predicted % 72   TLC L 4.14   TLC % Predicted % 75   RV % Predicted % 72     No results found for: "NITRICOXIDE"      Assessment & Plan:   Acute on chronic heart failure with preserved ejection fraction (HCC) Recent hospitalization with decompensated heart failure.  Patient is continue follow-up with cardiology.  Continue on current maintenance regimen. Does appear to be improved and appears euvolemic on exam.  Continue with daily weights. Continue on Lasix  Plan  Patient Instructions  Restart Oxygen 2l/m , goal is to keep Oxygen >88-90%.  Continue on CPAP At bedtime   CPAP download .  Activity as tolerated  Continue on Lasix  Follow up with .Dr. Halford Chessman  in 2 weeks and As needed  Please contact office for sooner follow up if symptoms do not improve or worsen or seek emergency care        Acute renal failure superimposed on stage 2 chronic kidney disease ([place]) Recent decompensation with aggressive diuresis during hospitalization.  Patient will need ongoing management .  Continue with follow-up with nephrology.   OSA (obstructive sleep apnea) Continue on CPAP at bedtime.  CPAP download was requested.  Patient did have recent hospitalization with acute hypercarbic and hypoxic respiratory failure.  Appears to be associated with decompensated heart failure.  We will need to take a look at her CPAP download.  Could need a CPAP titration study and consideration for BiPAP if chronic hypercarbia and OHS is suspected  Plan Patient Instructions  Restart Oxygen 2l/m ,  goal is to keep Oxygen >88-90%.  Continue on CPAP At bedtime   CPAP download .  Activity as tolerated  Continue on Lasix  Follow up with .Dr. Halford Chessman  in 2 weeks and As needed   Please contact office for sooner follow up if symptoms do not improve or worsen or seek emergency care       Acute respiratory failure with hypercapnia ([place]) Recent admission with acute hypoxic and hypercarbic respiratory failure with associated decompensated heart failure.  Right heart cath showed mild pulmonary hypertension. Suspect is multifactorial in nature.  Patient unfortunately did not start on oxygen at home as recommended.  Patient education given on potential complications of hypoxemia. Rec that she restart oxygen.  Walk test today in the office shows that patient requires 2 L of oxygen to maintain O2 saturations greater than 88 to 90%. Patient was given our [employer] oxygen device today in office.  Orders been sent to DME to begin oxygen she will use this until her oxygen is set up at home.  Plan  Patient Instructions  Restart Oxygen 2l/m , goal is to keep Oxygen >88-90%.  Continue on CPAP At bedtime   CPAP download .  Activity as tolerated  Continue on Lasix  Follow up with .Dr. Halford Chessman  in 2 weeks and As needed  Please contact office for sooner follow up if symptoms do not improve or worsen or seek emergency care         I spent   50 minutes dedicated to the care of this patient on the date of this encounter to include pre-visit review of records, face-to-face time with the patient discussing conditions above, post visit ordering of testing, clinical documentation with the electronic health record, making appropriate referrals as documented, and communicating necessary findings to members of the patients care team.   Rexene Edison, NP 10/29/2021

## 2021-10-29 NOTE — Assessment & Plan Note (Signed)
Recent admission with acute hypoxic and hypercarbic respiratory failure with associated decompensated heart failure.  Right heart cath showed mild pulmonary hypertension. Suspect is multifactorial in nature.  Patient unfortunately did not start on oxygen at home as recommended.  Patient education given on potential complications of hypoxemia. Rec that she restart oxygen.  Walk test today in the office shows that patient requires 2 L of oxygen to maintain O2 saturations greater than 88 to 90%. Patient was given our [employer] oxygen device today in office.  Orders been sent to DME to begin oxygen she will use this until her oxygen is set up at home.  Plan  Patient Instructions  Restart Oxygen 2l/m , goal is to keep Oxygen >88-90%.  Continue on CPAP At bedtime   CPAP download .  Activity as tolerated  Continue on Lasix  Follow up with .Dr. Halford Chessman  in 2 weeks and As needed  Please contact office for sooner follow up if symptoms do not improve or worsen or seek emergency care

## 2021-10-29 NOTE — Assessment & Plan Note (Addendum)
Recent decompensation with aggressive diuresis during hospitalization.  Patient will need ongoing management .  Continue with follow-up with nephrology.

## 2021-10-29 NOTE — Telephone Encounter (Signed)
[employer] to get [name] tagged in her airview count

## 2021-10-29 NOTE — Assessment & Plan Note (Signed)
Continue on CPAP at bedtime.  CPAP download was requested.  Patient did have recent hospitalization with acute hypercarbic and hypoxic respiratory failure.  Appears to be associated with decompensated heart failure.  We will need to take a look at her CPAP download.  Could need a CPAP titration study and consideration for BiPAP if chronic hypercarbia and OHS is suspected  Plan Patient Instructions  Restart Oxygen 2l/m , goal is to keep Oxygen >88-90%.  Continue on CPAP At bedtime   CPAP download .  Activity as tolerated  Continue on Lasix  Follow up with .Dr. Halford Chessman  in 2 weeks and As needed  Please contact office for sooner follow up if symptoms do not improve or worsen or seek emergency care

## 2021-10-29 NOTE — Patient Instructions (Signed)
Restart Oxygen 2l/m , goal is to keep Oxygen >88-90%.  Continue on CPAP At bedtime   CPAP download .  Activity as tolerated  Continue on Lasix  Follow up with .Dr. Halford Chessman  in 2 weeks and As needed  Please contact office for sooner follow up if symptoms do not improve or worsen or seek emergency care

## 2021-10-29 NOTE — Addendum Note (Signed)
Addended by: Vanessa Barbara on: 10/29/2021 02:12 PM   Modules accepted: Orders

## 2021-11-01 NOTE — Progress Notes (Unsigned)
Cardiology Office Note:    Date:  11/02/2021   ID:  Stacey Page, DOB 26-Oct-1950, MRN [id number]  PCP:  [name], MD   [place] Providers Cardiologist:  Glenetta Hew, MD Cardiology APP:  Ledora Bottcher, [place] { Referring MD: [name],*   Chief Complaint  Patient presents with   Hospitalization Follow-up    PH, HTN    History of Present Illness:    Stacey Page is a 71 y.o. female with a hx of moderate aortic stenosis, hypertensive heart disease with chronic diastolic heart failure, DM, hypertension, reactive airway disease, OSA on CPAP (OHS), ILD, hyperlipidemia, CKD stage III-IV, hyperlipidemia, and severe obesity.  Chronic dyspnea and respiratory status complicated by HFpEF, OHS, ILD, and OSA.  She follows with Dr. Ellyn Hack and was still hypertensive at last clinic visit 08/06/2021.  He felt there was clear evidence of chronotropic incompetence and he opted to reduce her beta-blocker.  Hydralazine was increased to 100 mg 3 times daily, Isordil to 40 mg twice daily. She was on ACEI - entesto and spiro were mentioned, but deferring to nephrology. Diuretics also managed by Dr. Royce Macadamia (nephrology). Was not on SGTL2i. Echo with progression of AS to moderate. Echo and CPX were completed. No cardiopulmonary limitation, OHS suspected. Echo with normal LVEF and grade 1 DD, moderate AS.   She was hospitalized 10/10/21 for respiratory failure. She was diuresed and underwent RHC that showed mild PH with mean PA pressure 28 mmHg suspected WHO group 3, normal filling pressures. Exacerbation felt related to medication nonadherence. She was transitioned to 80 mg PO lasix daily. No farxiga or spiro due to CKD stage IV.   She presents for hospital follow up. She is here with her 2 daughters. She is wearing her oxygen just when she feels like she needs it. She denies changes in breathing or lower extremity swelling. She has been on isordil for 6 months, but  reports headaches that started 2 months ago. This week she has had hypoglycemia in the 40s but her only symptoms was headache. She is concerned that she is not having symptoms of hypoglycemia other than headache. Since she has had 2 episodes of BG in the 40s this week, I advised to hold januvia until she can speak with Dr. Ethelene Hal. They will make an appt this week.   I advised to wear O2 24/7 for now while she recovers. Recent hospitalization after missing lasix for 2 days.  Past Medical History:  Diagnosis Date   Anxiety    doesn't take any meds   Arthritis of right knee 03/14/2016   Asthma    Back pain    Chronic diastolic CHF (congestive heart failure) (HCC)    HF with Preserved EF (60-65%) - Grade II Diastolic Dysfunction (Hypertensive Heart Disease). takes Furosemide daily   Depression    doesn't take meds   Diabetes ([place])    takes Januvia daily   Eczema    uses cream as needed   GERD (gastroesophageal reflux disease)    History of bronchitis as a child    HTN (hypertension)    Insomnia    Mild aortic stenosis by prior echocardiogram 04/2017   Mild stenosis: Mean gradient 15 mmHg, peak gradient 28 mmHg   Mycobacterium avium-intracellulare infection ([place]) 2016   OA (osteoarthritis)    Obese    OSA (obstructive sleep apnea) 02/25/2014   wears CPAP at night   Peripheral neuropathy    takes Gabapentin as needed  Pneumonia    hx of-2010   Seasonal allergies    uses Flonase daily   Sleep apnea     Past Surgical History:  Procedure Laterality Date   BREAST BIOPSY Right 03/2018   BREAST LUMPECTOMY WITH RADIOACTIVE SEED LOCALIZATION Right 12/28/2018   Procedure: RIGHT BREAST LUMPECTOMY WITH RADIOACTIVE SEED LOCALIZATION;  Surgeon: Jovita Kussmaul, MD;  Location: [facility];  Service: General;  Laterality: Right;   BREAST SURGERY     CARDIOPULMONARY EXERCISE TEST (CPX)  06/21/2021   (Performed on Coreg 25 mg twice daily): PFTs: FVC 1.76 (60%), FEV1 1.59 (77%), ratio 114% =>  RESTRICTIVE PHYSIOLOGY; 5 min- 1 mph - 2% grade. 7/10 dyspnea -> pulse ox range during exercise 95 to 98%, low of 90%, 97% on recovery; BP 106/84-160/64; heart rate 64-90 bpm (60% MPHR) -> Chronotropic Incompetence;;MAIN FACTOR for Exercise Limitation = BODY HABITUS w/ Chronotropic Incompetence.   CESAREAN SECTION  x2   COLONOSCOPY     CORONARY CALCIUM SCORE & CT ANGIOGRAM  09/2017   Coronary Ca Score = 11 (low).  CTA- no obstructive CAD (minimal disease)   JOINT REPLACEMENT     KNEE ARTHROSCOPY Right    LUNG BIOPSY Right 06/10/2014   Procedure: LUNG BIOPSY;  Surgeon: Ivin Poot, MD;  Location: [facility];  Service: Thoracic;  Laterality: Right;   POLYPECTOMY     throat   RIGHT HEART CATH N/A 10/15/2021   Procedure: RIGHT HEART CATH;  Surgeon: Belva Crome, MD;  Location: [facility];  Service: Cardiovascular;  Laterality: N/A;   TOTAL KNEE ARTHROPLASTY Right 03/14/2016   Procedure: RIGHT TOTAL KNEE ARTHROPLASTY;  Surgeon: Marybelle Killings, MD;  Location: [facility];  Service: Orthopedics;  Laterality: Right;   TRANSTHORACIC ECHOCARDIOGRAM  11/04/2019   EF 60-65%.  Moderate LVH.  GRII DD.  Mild LV dilation.  Mild LA dilation.  Thickened-calcified aortic valve-Mild AS (mean gradient 13 mmHg, peak 29 mmHg--similar to 2019)   TRANSTHORACIC ECHOCARDIOGRAM  06/14/2021   EF 60 to 65%.  No RWMA.  Moderate cLVH with GR 1 DD.  Mild LA dilation.  Mild to moderate aortic calcification/stenosis.  Mean AVG 18 mmHg.   VIDEO ASSISTED THORACOSCOPY Right 06/10/2014   Procedure: VIDEO ASSISTED THORACOSCOPY;  Surgeon: Ivin Poot, MD;  Location: [facility];  Service: Thoracic;  Laterality: Right;    Current Medications: Current Meds  Medication Sig   albuterol (VENTOLIN HFA) 108 (90 Base) MCG/ACT inhaler INHALE 1-2 PUFFS BY MOUTH into THE lungs every SIX hours AS NEEDED FOR WHEEZING AND/OR SHORTNESS OF BREATH   aspirin 81 MG tablet Take 1 tablet (81 mg total) by mouth daily with breakfast.   Blood Glucose  Monitoring Suppl ([place]) w/Device KIT 1 kit by Does not apply route daily as needed.   carvedilol (COREG) 25 MG tablet Take 1 tablet (25 mg total) by mouth 2 (two) times daily.   cholecalciferol (VITAMIN D3) 25 MCG (1000 UNIT) tablet Take 2,000 Units by mouth daily.   Continuous Blood Gluc Sensor (FREESTYLE LIBRE 14 DAY SENSOR) MISC Place one sensor on the skin every 14 days to monitor blood sugars continuously as directed.   Docusate Sodium 100 MG capsule Take 100 mg by mouth 2 (two) times daily.   ferrous sulfate 325 (65 FE) MG tablet Take 325 mg by mouth 2 (two) times daily with a meal.   fluticasone (FLONASE) 50 MCG/ACT nasal spray instill ONE SPRAY in each nostril daily (Patient taking differently: Place  1 spray into both nostrils daily.)   furosemide (LASIX) 80 MG tablet Take 1 tablet (80 mg total) by mouth daily.   gabapentin (NEURONTIN) 100 MG capsule Take 1 capsule (100 mg total) by mouth 2 (two) times daily.   glucose blood (ONETOUCH VERIO) test strip Use to monitor blood sugar twice daily as instructed   hydrALAZINE (APRESOLINE) 50 MG tablet Take 1 tablet (50 mg total) by mouth every 8 (eight) hours.   isosorbide dinitrate (ISORDIL) 10 MG tablet Take 1 tablet (10 mg total) by mouth 3 (three) times daily.   JANUVIA 25 MG tablet TAKE ONE TABLET BY MOUTH ONCE DAILY (Patient taking differently: Take 25 mg by mouth daily.)   montelukast (SINGULAIR) 10 MG tablet Take 1 tablet (10 mg total) by mouth at bedtime.   OneTouch Delica Lancets 89H MISC Use to monitor blood sugars once daily as directed   pantoprazole (PROTONIX) 40 MG tablet Take 1 tablet (40 mg total) by mouth daily.     Allergies:   Patient has no known allergies.   Social History   Socioeconomic History   Marital status: Widowed    Spouse name: Not on file   Number of children: Not on file   Years of education: Not on file   Highest education level: Not on file  Occupational History   Occupation:  retired  Tobacco Use   Smoking status: Former    Packs/day: 0.25    Years: 15.00    Total pack years: 3.75    Types: Cigarettes    Quit date: 02/08/1980    Years since quitting: 41.7   Smokeless tobacco: Former  Scientific laboratory technician Use: Never used  Substance and Sexual Activity   Alcohol use: Yes    Alcohol/week: 0.0 standard drinks of alcohol    Comment: occassional/social/rare   Drug use: Not Currently   Sexual activity: Not Currently  Other Topics Concern   Not on file  Social History Narrative   Not on file   Social Determinants of Health   Financial Resource Strain: Low Risk  (08/17/2021)   Overall Financial Resource Strain (CARDIA)    Difficulty of Paying Living Expenses: Not hard at all  Food Insecurity: No Food Insecurity (08/17/2021)   Hunger Vital Sign    Worried About Running Out of Food in the Last Year: Never true    Ran Out of Food in the Last Year: Never true  Transportation Needs: No Transportation Needs (08/17/2021)   PRAPARE - Hydrologist (Medical): No    Lack of Transportation (Non-Medical): No  Physical Activity: Insufficiently Active (08/17/2021)   Exercise Vital Sign    Days of Exercise per Week: 3 days    Minutes of Exercise per Session: 30 min  Stress: No Stress Concern Present (08/17/2021)   [place]    Feeling of Stress : Not at all  Social Connections: Socially Isolated (08/17/2021)   Social Connection and Isolation Panel [NHANES]    Frequency of Communication with Friends and Family: Three times a week    Frequency of Social Gatherings with Friends and Family: Three times a week    Attends Religious Services: Never    Active Member of Clubs or Organizations: No    Attends Archivist Meetings: Never    Marital Status: Widowed     Family History: The patient's family history includes Alcoholism in her father; Anemia in her father; COPD  in  her father; Cancer (age of onset: 35) in her mother; High Cholesterol in her mother; High blood pressure in her father and mother; Hypothyroidism in her father. There is no history of Breast cancer.  ROS:   Please see the history of present illness.     All other systems reviewed and are negative.  EKGs/Labs/Other Studies Reviewed:    The following studies were reviewed today:  [place] 10/15/21: Mild pulmonary hypertension with mean PA pressure 28 mmHg, capillary wedge pressure mean 13 mmHg, and pulmonary vascular resistance 2.73 Wood units.  Pulmonary hypertension etiology suspected to be WHO group 3. Right atrial pressure is normal with mean of 6 mmHg. Cardiac output 5.5 L/min, index 2.17 L/min/m.   Echo 10/10/21: 1. Left ventricular ejection fraction, by estimation, is 70 to 75%. The  left ventricle has hyperdynamic function. The left ventricle has no  regional wall motion abnormalities. There is moderate left ventricular  hypertrophy. Left ventricular diastolic  parameters are indeterminate.   2. Right ventricular systolic function is normal. The right ventricular  size is normal. There is moderately elevated pulmonary artery systolic  pressure.   3. Left atrial size was moderately dilated.   4. The mitral valve is normal in structure. No evidence of mitral valve  regurgitation. No evidence of mitral stenosis. Moderate mitral annular  calcification.   5. The aortic valve is calcified. There is mild calcification of the  aortic valve. There is mild thickening of the aortic valve. Aortic valve  regurgitation is not visualized. Moderate aortic valve stenosis. Aortic  valve area, by VTI measures 1.88 cm.  Aortic valve mean gradient measures 23.0 mmHg. Aortic valve Vmax measures  3.12 m/s.   6. The inferior vena cava is dilated in size with <50% respiratory  variability, suggesting right atrial pressure of 15 mmHg.   EKG:  EKG is  ordered today.  The ekg ordered today demonstrates  sinus rhythm HR 68  Recent Labs: 07/14/2021: ALT 7 10/11/2021: B Natriuretic Peptide 478.7 10/15/2021: Hemoglobin 13.3; Hemoglobin 13.3; Platelets 171 10/16/2021: BUN 44; Creatinine, Ser 2.40; Magnesium 2.1; Potassium 3.9; Sodium 138  Recent Lipid Panel    Component Value Date/Time   CHOL 116 05/14/2020 0910   CHOL 128 07/04/2019 1240   TRIG 61.0 05/14/2020 0910   HDL 51.60 05/14/2020 0910   HDL 61 07/04/2019 1240   CHOLHDL 2 05/14/2020 0910   VLDL 12.2 05/14/2020 0910   LDLCALC 52 05/14/2020 0910   LDLCALC 53 07/04/2019 1240     Risk Assessment/Calculations:                Physical Exam:    VS:  BP 134/72   Pulse 68   Ht '5\' 8"'  (1.727 m)   Wt (!) 339 lb 9.6 oz (154 kg)   SpO2 90%   BMI 51.64 kg/m     Wt Readings from Last 3 Encounters:  11/02/21 (!) 339 lb 9.6 oz (154 kg)  10/29/21 (!) 337 lb 6.4 oz (153 kg)  10/19/21 (!) 335 lb 12.8 oz (152.3 kg)     GEN:  Well nourished, well developed in no acute distress HEENT: Normal NECK: No JVD; No carotid bruits LYMPHATICS: No lymphadenopathy CARDIAC: RRR, no murmurs, rubs, gallops RESPIRATORY:  Clear to auscultation without rales, wheezing or rhonchi  ABDOMEN: Soft, non-tender, non-distended MUSCULOSKELETAL:  No edema; No deformity  SKIN: Warm and dry NEUROLOGIC:  Alert and oriented x 3 PSYCHIATRIC:  Normal affect   ASSESSMENT:    1. Hypertensive  heart disease with chronic diastolic congestive heart failure ([place])   2. Chronic heart failure with preserved ejection fraction ([place])   3. ILD (interstitial lung disease) 2/2 MAI s/p med rxn   4. OSA (obstructive sleep apnea)   5. Obesity hypoventilation syndrome ([place])   6. Pulmonary hypertension, unspecified ([place])   7. CKD (chronic kidney disease) stage 4, GFR 15-29 ml/min (HCC)   8. Moderate aortic stenosis   9. Hyperlipidemia associated with type 2 diabetes mellitus (HCC)    PLAN:    In order of problems listed above:  Chronic diastolic heart failure Pulmonary  hypertension, WHO group 3 ILD, OHS, OSA on CPAP CKD stage IV Hypertension GDMT limited by renal function RHC with normal filling pressures, hypoxia not explained by volume overload.  - hydralazine 50 mg TID, isordil 10 mg TID, coreg 6.25 mg BID - BP well controlled, no medication changes - I do not think her headaches are isordil related, but if they persist after hypoglycemia is resolved, may need to consider medication change   Moderate AS AVA 1.88cm2, mean gradient 35mHg, Vmax 3.121m Will continue to follow with echo in 1 year.    Hyperlipidemia Continue 20 mg lipitor daily   Follow up as scheduled. No medication changes No labs      Medication Adjustments/Labs and Tests Ordered: Current medicines are reviewed at length with the patient today.  Concerns regarding medicines are outlined above.  Orders Placed This Encounter  Procedures   EKG 12-Lead   No orders of the defined types were placed in this encounter.   Patient Instructions  Medication Instructions:  Your physician recommends that you continue on your current medications as directed. Please refer to the Current Medication list given to you today.   *If you need a refill on your cardiac medications before your next appointment, please call your pharmacy*   Lab Work: NONE ordered at this time of appointment   If you have labs (blood work) drawn today and your tests are completely normal, you will receive your results only by: My[place]if you have MyChart) OR A paper copy in the mail If you have any lab test that is abnormal or we need to change your treatment, we will call you to review the results.   Testing/Procedures: NONE ordered at this time of appointment     Follow-Up: At [facility] and your health needs are our priority.  As part of our continuing mission to provide you with exceptional heart care, we have created designated Provider Care Teams.  These Care Teams  include your primary Cardiologist (physician) and Advanced Practice Providers (APPs -  Physician Assistants and Nurse Practitioners) who all work together to provide you with the care you need, when you need it.  We recommend signing up for the patient portal called "MyChart".  Sign up information is provided on this After Visit Summary.  MyChart is used to connect with patients for Virtual Visits (Telemedicine).  Patients are able to view lab/test results, encounter notes, upcoming appointments, etc.  Non-urgent messages can be sent to your provider as well.   To learn more about what you can do with MyChart, go to [email or web address]   Your next appointment:    Keep Follow up  The format for your next appointment:   In Person  Provider:   DaGlenetta HewMD     Other Instructions   Important Information About Sugar         Signed, AnLevada Dy  Augustin Coupe, [place]  11/02/2021 4:40 PM    [facility]

## 2021-11-02 ENCOUNTER — Telehealth: Payer: Self-pay | Admitting: Family Medicine

## 2021-11-02 ENCOUNTER — Ambulatory Visit: Payer: No Typology Code available for payment source | Attending: Physician Assistant | Admitting: Physician Assistant

## 2021-11-02 ENCOUNTER — Encounter: Payer: Self-pay | Admitting: Physician Assistant

## 2021-11-02 VITALS — BP 134/72 | HR 68 | Ht 68.0 in | Wt 339.6 lb

## 2021-11-02 DIAGNOSIS — E785 Hyperlipidemia, unspecified: Secondary | ICD-10-CM

## 2021-11-02 DIAGNOSIS — N184 Chronic kidney disease, stage 4 (severe): Secondary | ICD-10-CM

## 2021-11-02 DIAGNOSIS — E662 Morbid (severe) obesity with alveolar hypoventilation: Secondary | ICD-10-CM | POA: Diagnosis not present

## 2021-11-02 DIAGNOSIS — I272 Pulmonary hypertension, unspecified: Secondary | ICD-10-CM | POA: Diagnosis not present

## 2021-11-02 DIAGNOSIS — I5032 Chronic diastolic (congestive) heart failure: Secondary | ICD-10-CM

## 2021-11-02 DIAGNOSIS — G4733 Obstructive sleep apnea (adult) (pediatric): Secondary | ICD-10-CM | POA: Diagnosis not present

## 2021-11-02 DIAGNOSIS — I110 Hypertensive heart disease with heart failure: Secondary | ICD-10-CM

## 2021-11-02 DIAGNOSIS — I350 Nonrheumatic aortic (valve) stenosis: Secondary | ICD-10-CM

## 2021-11-02 DIAGNOSIS — J849 Interstitial pulmonary disease, unspecified: Secondary | ICD-10-CM | POA: Diagnosis not present

## 2021-11-02 DIAGNOSIS — E1169 Type 2 diabetes mellitus with other specified complication: Secondary | ICD-10-CM | POA: Diagnosis not present

## 2021-11-02 NOTE — Patient Instructions (Signed)
Medication Instructions:  Your physician recommends that you continue on your current medications as directed. Please refer to the Current Medication list given to you today.   *If you need a refill on your cardiac medications before your next appointment, please call your pharmacy*   Lab Work: NONE ordered at this time of appointment   If you have labs (blood work) drawn today and your tests are completely normal, you will receive your results only by: [place] (if you have MyChart) OR A paper copy in the mail If you have any lab test that is abnormal or we need to change your treatment, we will call you to review the results.   Testing/Procedures: NONE ordered at this time of appointment     Follow-Up: At [facility], you and your health needs are our priority.  As part of our continuing mission to provide you with exceptional heart care, we have created designated Provider Care Teams.  These Care Teams include your primary Cardiologist (physician) and Advanced Practice Providers (APPs -  Physician Assistants and Nurse Practitioners) who all work together to provide you with the care you need, when you need it.  We recommend signing up for the patient portal called "MyChart".  Sign up information is provided on this After Visit Summary.  MyChart is used to connect with patients for Virtual Visits (Telemedicine).  Patients are able to view lab/test results, encounter notes, upcoming appointments, etc.  Non-urgent messages can be sent to your provider as well.   To learn more about what you can do with MyChart, go to [email or web address].    Your next appointment:    Keep Follow up  The format for your next appointment:   In Person  Provider:   Glenetta Hew, MD     Other Instructions   Important Information About Sugar

## 2021-11-02 NOTE — Telephone Encounter (Signed)
Stacey Page from [facility] called and stated that they need a verbal order ET to start on 9.27.23

## 2021-11-03 ENCOUNTER — Ambulatory Visit (INDEPENDENT_AMBULATORY_CARE_PROVIDER_SITE_OTHER): Payer: No Typology Code available for payment source | Admitting: Family Medicine

## 2021-11-03 ENCOUNTER — Telehealth: Payer: Self-pay | Admitting: Family Medicine

## 2021-11-03 ENCOUNTER — Encounter: Payer: Self-pay | Admitting: Family Medicine

## 2021-11-03 VITALS — BP 157/67 | HR 74 | Temp 98.3°F | Ht 68.0 in | Wt 338.4 lb

## 2021-11-03 DIAGNOSIS — I130 Hypertensive heart and chronic kidney disease with heart failure and stage 1 through stage 4 chronic kidney disease, or unspecified chronic kidney disease: Secondary | ICD-10-CM | POA: Diagnosis not present

## 2021-11-03 DIAGNOSIS — N184 Chronic kidney disease, stage 4 (severe): Secondary | ICD-10-CM

## 2021-11-03 DIAGNOSIS — I248 Other forms of acute ischemic heart disease: Secondary | ICD-10-CM | POA: Diagnosis not present

## 2021-11-03 DIAGNOSIS — J849 Interstitial pulmonary disease, unspecified: Secondary | ICD-10-CM | POA: Diagnosis not present

## 2021-11-03 DIAGNOSIS — I5033 Acute on chronic diastolic (congestive) heart failure: Secondary | ICD-10-CM | POA: Diagnosis not present

## 2021-11-03 DIAGNOSIS — Z6841 Body Mass Index (BMI) 40.0 and over, adult: Secondary | ICD-10-CM | POA: Diagnosis not present

## 2021-11-03 DIAGNOSIS — E1122 Type 2 diabetes mellitus with diabetic chronic kidney disease: Secondary | ICD-10-CM

## 2021-11-03 DIAGNOSIS — E162 Hypoglycemia, unspecified: Secondary | ICD-10-CM | POA: Diagnosis not present

## 2021-11-03 DIAGNOSIS — J9602 Acute respiratory failure with hypercapnia: Secondary | ICD-10-CM | POA: Diagnosis not present

## 2021-11-03 DIAGNOSIS — G4733 Obstructive sleep apnea (adult) (pediatric): Secondary | ICD-10-CM | POA: Diagnosis not present

## 2021-11-03 NOTE — Telephone Encounter (Signed)
Amira from [facility] called again stating she still waiting on the verbal orders

## 2021-11-03 NOTE — Telephone Encounter (Signed)
Almira from [facility] number is [phone]. She said if she don't answer lvm

## 2021-11-03 NOTE — Telephone Encounter (Signed)
Have no contact number to call back for verbal.

## 2021-11-03 NOTE — Telephone Encounter (Signed)
Duplicate

## 2021-11-03 NOTE — Telephone Encounter (Signed)
Spoke with Almira at [facility] verbal orders given for 2 x wk x 2 weeks and 1 x wk x 2 weeks. Written orders will be faxed to be signed.

## 2021-11-03 NOTE — Progress Notes (Signed)
[facility] [address] [place] [address] Dept: [phone] Dept Fax: [phone]  Office Visit  Subjective:    Patient ID: Stacey Page, female    DOB: May 27, 1950, 71 y.o..   MRN: [id number]  Chief Complaint  Patient presents with   Acute Visit    Bp dropped here for a check. Was instructed to stop taking her [place] by cardiologist     History of Present Illness:  Patient is in today for an assessment of her blood sugars. Stacey Page had been seen by the PA at her cardiologist office yesterday. She had disclosed that she was experiencing frequent episodes of low blood sugars more recently. The PA encouraged her to hold her Januvia and come see her PCP, who was unavailable today.  Stacey Page  has a longstanding history of Type 2 diabetes with complications involving cardiac disease, neuropathy, and chronic kidney disease. Her CKD is currently at Stage 4, with a eGFR of 21. Her only medication for diabetes currently is sitagliptin (Januvia). Her A1c values over the past 2 years have been in the prediabetes range. She uses a CGM. She notes when she has a low sugar, she either drinks juice or eats a banana. She is concerned, as she says she does not feel very symptomatic with the low blood sugars.  Stacey Page also notes issues with feeling like someone is pounding or stepping on her head. She has home oxygen prescribed, but does not use this very consistently. She finds when she does use her oxygen, her headache goes away.  Past Medical History: Patient Active Problem List   Diagnosis Date Noted   Demand ischemia ([place])    Respiratory acidosis    Acute respiratory failure with hypercapnia (HCC)    Acute on chronic heart failure with preserved ejection fraction (HCC)    CHF (congestive heart failure) ([place]) 10/09/2021   Reactive airway disease 09/21/2021   Viral syndrome 09/21/2021   Hypertensive retinopathy of right eye,  grade 1 08/11/2021   Grade 2 hypertensive retinopathy, left 08/11/2021   Nuclear sclerotic cataract of both eyes 08/11/2021   Need for shingles vaccine 07/14/2021   Iron deficiency anemia 01/13/2021   CKD (chronic kidney disease) stage 4, GFR 15-29 ml/min ([place]) 07/09/2019   Diabetes mellitus type 2 in obese ([place]) 07/02/2019   Polyneuropathy associated with underlying disease ([place]) 11/28/2018   Severe episode of recurrent major depressive disorder, without psychotic features ([place]) 11/28/2018   Type 2 diabetes mellitus with diabetic peripheral angiopathy without gangrene, without long-term current use of insulin ([place]) 11/28/2018   Poor short term memory 11/28/2018   Moderate aortic stenosis by prior echocardiogram 11/06/2018   Diabetes mellitus ([place]) 03/21/2017   Acute renal failure superimposed on stage 2 chronic kidney disease ([place]) 03/17/2016   Symptomatic anemia 03/17/2016   S/P total knee arthroplasty, right 03/17/2016   Unilateral primary osteoarthritis, right knee    Hyperlipidemia associated with type 2 diabetes mellitus ([place]) 12/15/2015   Pain in both lower extremities 11/19/2015   Numbness in both hands 08/13/2015   Panic attack 06/09/2015   Right knee pain 05/14/2015   Chronic renal insufficiency 09/16/2014   Pneumothorax on right    Atypical mycobacterium infection    ILD (interstitial lung disease) 2/2 MAI s/p med rxn    Class 3 severe obesity with serious comorbidity and body mass index (BMI) of 50.0 to 59.9 in adult ([place]) 04/03/2014   OSA (obstructive sleep apnea) 02/25/2014   Hypertensive  heart disease with chronic diastolic congestive heart failure ([place]) 02/25/2014   DOE (dyspnea on exertion) 02/25/2014   Essential hypertension 02/25/2014   Past Surgical History:  Procedure Laterality Date   BREAST BIOPSY Right 03/2018   BREAST LUMPECTOMY WITH RADIOACTIVE SEED LOCALIZATION Right 12/28/2018   Procedure: RIGHT BREAST LUMPECTOMY WITH RADIOACTIVE SEED LOCALIZATION;   Surgeon: Jovita Kussmaul, MD;  Location: [facility];  Service: General;  Laterality: Right;   BREAST SURGERY     CARDIOPULMONARY EXERCISE TEST (CPX)  06/21/2021   (Performed on Coreg 25 mg twice daily): PFTs: FVC 1.76 (60%), FEV1 1.59 (77%), ratio 114% => RESTRICTIVE PHYSIOLOGY; 5 min- 1 mph - 2% grade. 7/10 dyspnea -> pulse ox range during exercise 95 to 98%, low of 90%, 97% on recovery; BP 106/84-160/64; heart rate 64-90 bpm (60% MPHR) -> Chronotropic Incompetence;;MAIN FACTOR for Exercise Limitation = BODY HABITUS w/ Chronotropic Incompetence.   CESAREAN SECTION  x2   COLONOSCOPY     CORONARY CALCIUM SCORE & CT ANGIOGRAM  09/2017   Coronary Ca Score = 11 (low).  CTA- no obstructive CAD (minimal disease)   JOINT REPLACEMENT     KNEE ARTHROSCOPY Right    LUNG BIOPSY Right 06/10/2014   Procedure: LUNG BIOPSY;  Surgeon: Ivin Poot, MD;  Location: [facility];  Service: Thoracic;  Laterality: Right;   POLYPECTOMY     throat   RIGHT HEART CATH N/A 10/15/2021   Procedure: RIGHT HEART CATH;  Surgeon: Belva Crome, MD;  Location: [facility];  Service: Cardiovascular;  Laterality: N/A;   TOTAL KNEE ARTHROPLASTY Right 03/14/2016   Procedure: RIGHT TOTAL KNEE ARTHROPLASTY;  Surgeon: Marybelle Killings, MD;  Location: [facility];  Service: Orthopedics;  Laterality: Right;   TRANSTHORACIC ECHOCARDIOGRAM  11/04/2019   EF 60-65%.  Moderate LVH.  GRII DD.  Mild LV dilation.  Mild LA dilation.  Thickened-calcified aortic valve-Mild AS (mean gradient 13 mmHg, peak 29 mmHg--similar to 2019)   TRANSTHORACIC ECHOCARDIOGRAM  06/14/2021   EF 60 to 65%.  No RWMA.  Moderate cLVH with GR 1 DD.  Mild LA dilation.  Mild to moderate aortic calcification/stenosis.  Mean AVG 18 mmHg.   VIDEO ASSISTED THORACOSCOPY Right 06/10/2014   Procedure: VIDEO ASSISTED THORACOSCOPY;  Surgeon: Ivin Poot, MD;  Location: [facility];  Service: Thoracic;  Laterality: Right;   Family History  Problem Relation Age of Onset   COPD Father     Hypothyroidism Father    Anemia Father        iron deficiency   High blood pressure Father    Alcoholism Father    Cancer Mother 30       pancreatic   High blood pressure Mother    High Cholesterol Mother    Breast cancer Neg Hx    Outpatient Medications Prior to Visit  Medication Sig Dispense Refill   albuterol (VENTOLIN HFA) 108 (90 Base) MCG/ACT inhaler INHALE 1-2 PUFFS BY MOUTH into THE lungs every SIX hours AS NEEDED FOR WHEEZING AND/OR SHORTNESS OF BREATH 8.5 g 3   aspirin 81 MG tablet Take 1 tablet (81 mg total) by mouth daily with breakfast. 90 tablet 0   Blood Glucose Monitoring Suppl ([place]) w/Device KIT 1 kit by Does not apply route daily as needed. 1 kit 0   carvedilol (COREG) 25 MG tablet Take 1 tablet (25 mg total) by mouth 2 (two) times daily. 180 tablet 3   cholecalciferol (VITAMIN D3) 25 MCG (1000 UNIT)  tablet Take 2,000 Units by mouth daily.     Continuous Blood Gluc Sensor (FREESTYLE LIBRE 14 DAY SENSOR) MISC Place one sensor on the skin every 14 days to monitor blood sugars continuously as directed. 6 each 3   Docusate Sodium 100 MG capsule Take 100 mg by mouth 2 (two) times daily.     ferrous sulfate 325 (65 FE) MG tablet Take 325 mg by mouth 2 (two) times daily with a meal.     fluticasone (FLONASE) 50 MCG/ACT nasal spray instill ONE SPRAY in each nostril daily (Patient taking differently: Place 1 spray into both nostrils daily.) 16 g 3   furosemide (LASIX) 80 MG tablet Take 1 tablet (80 mg total) by mouth daily. 30 tablet 6   gabapentin (NEURONTIN) 100 MG capsule Take 1 capsule (100 mg total) by mouth 2 (two) times daily. 180 capsule 1   glucose blood (ONETOUCH VERIO) test strip Use to monitor blood sugar twice daily as instructed 200 each 3   hydrALAZINE (APRESOLINE) 50 MG tablet Take 1 tablet (50 mg total) by mouth every 8 (eight) hours. 90 tablet 3   isosorbide dinitrate (ISORDIL) 10 MG tablet Take 1 tablet (10 mg total) by mouth 3 (three)  times daily. 90 tablet 3   montelukast (SINGULAIR) 10 MG tablet Take 1 tablet (10 mg total) by mouth at bedtime. 90 tablet 3   OneTouch Delica Lancets 84Z MISC Use to monitor blood sugars once daily as directed 100 each 11   pantoprazole (PROTONIX) 40 MG tablet Take 1 tablet (40 mg total) by mouth daily. 30 tablet 0   JANUVIA 25 MG tablet TAKE ONE TABLET BY MOUTH ONCE DAILY (Patient not taking: Reported on 11/03/2021) 30 tablet 0   No facility-administered medications prior to visit.   No Known Allergies    Objective:   Today's Vitals   11/03/21 1536  BP: (!) 149/65  Pulse: 74  Temp: 98.3 F (36.8 C)  TempSrc: Temporal  SpO2: 91%  Weight: (!) 338 lb 6.4 oz (153.5 kg)  Height: _0  (1.727 m)   Body mass index is 51.45 kg/m.   General: Well developed, well nourished. No acute distress. Psych: Alert and oriented. Normal mood and affect.  There are no preventive care reminders to display for this patient.    Lab Results: Lab Results  Component Value Date   HGBA1C 6.0 (A) 07/14/2021      Latest Ref Rng & Units 10/16/2021    3:54 AM 10/15/2021    1:04 PM 10/15/2021    7:36 AM  BMP  Glucose 70 - 99 mg/dL 120   151   BUN 8 - 23 mg/dL 44   38   Creatinine 0.44 - 1.00 mg/dL 2.40   2.33   Sodium 135 - 145 mmol/L 138  139    140  141   Potassium 3.5 - 5.1 mmol/L 3.9  3.8    3.8  3.8   Chloride 98 - 111 mmol/L 93   92   CO2 22 - 32 mmol/L 34   38   Calcium 8.9 - 10.3 mg/dL 9.2   9.7    Assessment & Plan:   1. Hypoglycemia I reviewed blood sugar trends on her phone and yesterday's cardiology note. This does show frequent episodes of hypoglycemia over the past week. I recommend she stop taking her Januvia. We discussed that she may no longer need specific therapy for diabetes in light of her kidney disease. I will have her follow  up with Dr. Ethelene Hal in 1 month for reassessment.  2. Type 2 diabetes mellitus with stage 4 chronic kidney disease, without long-term current use of  insulin ([place]) 3. CKD (chronic kidney disease) stage 4, GFR 15-29 ml/min (HCC) Currently engaged with nephrology. Blood pressure is high today. Stacey Page should continue to work with nephrology and cardiology regarding management of her BP.  4. Interstitial lung disease ([place]) I expect her headache is in part due to hypoxia. I encouraged her to be more consistent in her home O2 use.  Return in about 4 weeks (around 12/01/2021) for Reassessment with PCP.   Haydee Salter, MD

## 2021-11-04 ENCOUNTER — Ambulatory Visit: Payer: No Typology Code available for payment source | Admitting: Family Medicine

## 2021-11-08 DIAGNOSIS — I509 Heart failure, unspecified: Secondary | ICD-10-CM | POA: Diagnosis not present

## 2021-11-08 DIAGNOSIS — Z9981 Dependence on supplemental oxygen: Secondary | ICD-10-CM | POA: Diagnosis not present

## 2021-11-08 DIAGNOSIS — E261 Secondary hyperaldosteronism: Secondary | ICD-10-CM | POA: Diagnosis not present

## 2021-11-08 DIAGNOSIS — K219 Gastro-esophageal reflux disease without esophagitis: Secondary | ICD-10-CM | POA: Diagnosis not present

## 2021-11-08 DIAGNOSIS — R2681 Unsteadiness on feet: Secondary | ICD-10-CM | POA: Diagnosis not present

## 2021-11-08 DIAGNOSIS — E1169 Type 2 diabetes mellitus with other specified complication: Secondary | ICD-10-CM | POA: Diagnosis not present

## 2021-11-08 DIAGNOSIS — E1142 Type 2 diabetes mellitus with diabetic polyneuropathy: Secondary | ICD-10-CM | POA: Diagnosis not present

## 2021-11-08 DIAGNOSIS — G4733 Obstructive sleep apnea (adult) (pediatric): Secondary | ICD-10-CM | POA: Diagnosis not present

## 2021-11-08 DIAGNOSIS — F17211 Nicotine dependence, cigarettes, in remission: Secondary | ICD-10-CM | POA: Diagnosis not present

## 2021-11-08 DIAGNOSIS — Z6841 Body Mass Index (BMI) 40.0 and over, adult: Secondary | ICD-10-CM | POA: Diagnosis not present

## 2021-11-08 DIAGNOSIS — N184 Chronic kidney disease, stage 4 (severe): Secondary | ICD-10-CM | POA: Diagnosis not present

## 2021-11-08 DIAGNOSIS — I130 Hypertensive heart and chronic kidney disease with heart failure and stage 1 through stage 4 chronic kidney disease, or unspecified chronic kidney disease: Secondary | ICD-10-CM | POA: Diagnosis not present

## 2021-11-08 DIAGNOSIS — Z008 Encounter for other general examination: Secondary | ICD-10-CM | POA: Diagnosis not present

## 2021-11-08 DIAGNOSIS — E1122 Type 2 diabetes mellitus with diabetic chronic kidney disease: Secondary | ICD-10-CM | POA: Diagnosis not present

## 2021-11-08 DIAGNOSIS — E785 Hyperlipidemia, unspecified: Secondary | ICD-10-CM | POA: Diagnosis not present

## 2021-11-08 DIAGNOSIS — J449 Chronic obstructive pulmonary disease, unspecified: Secondary | ICD-10-CM | POA: Diagnosis not present

## 2021-11-08 NOTE — Progress Notes (Signed)
Pt not seen.

## 2021-11-09 ENCOUNTER — Telehealth: Payer: Self-pay | Admitting: Family Medicine

## 2021-11-09 DIAGNOSIS — I130 Hypertensive heart and chronic kidney disease with heart failure and stage 1 through stage 4 chronic kidney disease, or unspecified chronic kidney disease: Secondary | ICD-10-CM | POA: Diagnosis not present

## 2021-11-09 DIAGNOSIS — N184 Chronic kidney disease, stage 4 (severe): Secondary | ICD-10-CM | POA: Diagnosis not present

## 2021-11-09 DIAGNOSIS — I5033 Acute on chronic diastolic (congestive) heart failure: Secondary | ICD-10-CM | POA: Diagnosis not present

## 2021-11-09 DIAGNOSIS — G4733 Obstructive sleep apnea (adult) (pediatric): Secondary | ICD-10-CM | POA: Diagnosis not present

## 2021-11-09 DIAGNOSIS — I2481 Acute coronary microvascular dysfunction: Secondary | ICD-10-CM | POA: Diagnosis not present

## 2021-11-09 DIAGNOSIS — Z6841 Body Mass Index (BMI) 40.0 and over, adult: Secondary | ICD-10-CM | POA: Diagnosis not present

## 2021-11-09 DIAGNOSIS — E1122 Type 2 diabetes mellitus with diabetic chronic kidney disease: Secondary | ICD-10-CM | POA: Diagnosis not present

## 2021-11-09 DIAGNOSIS — J849 Interstitial pulmonary disease, unspecified: Secondary | ICD-10-CM | POA: Diagnosis not present

## 2021-11-09 DIAGNOSIS — J9602 Acute respiratory failure with hypercapnia: Secondary | ICD-10-CM | POA: Diagnosis not present

## 2021-11-09 NOTE — Telephone Encounter (Signed)
Caller Name: [facility] Call back phone #: [phone]  Reason for Call: Please call to confirm we have received fax

## 2021-11-10 ENCOUNTER — Ambulatory Visit: Payer: Self-pay

## 2021-11-10 ENCOUNTER — Ambulatory Visit: Payer: No Typology Code available for payment source | Admitting: Podiatry

## 2021-11-10 DIAGNOSIS — E119 Type 2 diabetes mellitus without complications: Secondary | ICD-10-CM

## 2021-11-10 DIAGNOSIS — L84 Corns and callosities: Secondary | ICD-10-CM

## 2021-11-10 DIAGNOSIS — E1142 Type 2 diabetes mellitus with diabetic polyneuropathy: Secondary | ICD-10-CM | POA: Diagnosis not present

## 2021-11-10 DIAGNOSIS — M2141 Flat foot [pes planus] (acquired), right foot: Secondary | ICD-10-CM

## 2021-11-10 DIAGNOSIS — M2142 Flat foot [pes planus] (acquired), left foot: Secondary | ICD-10-CM

## 2021-11-10 DIAGNOSIS — B351 Tinea unguium: Secondary | ICD-10-CM

## 2021-11-10 DIAGNOSIS — M79674 Pain in right toe(s): Secondary | ICD-10-CM

## 2021-11-10 DIAGNOSIS — M79675 Pain in left toe(s): Secondary | ICD-10-CM

## 2021-11-10 NOTE — Telephone Encounter (Signed)
Forms received from [facility] pending to be viewed and signed. Called number provided for [facility] for clarity on forms that were being sent no answer will call back.

## 2021-11-11 ENCOUNTER — Encounter: Payer: Self-pay | Admitting: Adult Health

## 2021-11-11 ENCOUNTER — Ambulatory Visit (INDEPENDENT_AMBULATORY_CARE_PROVIDER_SITE_OTHER): Payer: No Typology Code available for payment source | Admitting: Adult Health

## 2021-11-11 ENCOUNTER — Encounter (INDEPENDENT_AMBULATORY_CARE_PROVIDER_SITE_OTHER): Payer: Self-pay | Admitting: Adult Health

## 2021-11-11 VITALS — BP 142/70 | HR 77 | Temp 97.6°F | Ht 68.0 in | Wt 335.0 lb

## 2021-11-11 VITALS — BP 108/60 | HR 63 | Temp 98.1°F | Ht 68.0 in | Wt 336.0 lb

## 2021-11-11 DIAGNOSIS — E669 Obesity, unspecified: Secondary | ICD-10-CM

## 2021-11-11 DIAGNOSIS — J9611 Chronic respiratory failure with hypoxia: Secondary | ICD-10-CM

## 2021-11-11 DIAGNOSIS — J849 Interstitial pulmonary disease, unspecified: Secondary | ICD-10-CM | POA: Diagnosis not present

## 2021-11-11 DIAGNOSIS — Z6841 Body Mass Index (BMI) 40.0 and over, adult: Secondary | ICD-10-CM | POA: Diagnosis not present

## 2021-11-11 DIAGNOSIS — N184 Chronic kidney disease, stage 4 (severe): Secondary | ICD-10-CM

## 2021-11-11 DIAGNOSIS — I5031 Acute diastolic (congestive) heart failure: Secondary | ICD-10-CM

## 2021-11-11 DIAGNOSIS — E1122 Type 2 diabetes mellitus with diabetic chronic kidney disease: Secondary | ICD-10-CM | POA: Diagnosis not present

## 2021-11-11 DIAGNOSIS — G4733 Obstructive sleep apnea (adult) (pediatric): Secondary | ICD-10-CM

## 2021-11-11 NOTE — Progress Notes (Signed)
'@Patient'  ID: Stacey Page, female    DOB: [birth year], 71 y.o.   MRN: [id number]  Chief Complaint  Patient presents with   Follow-up    Referring provider: [name]  HPI: 71 year old female former smoker followed for asthma, sleep apnea and history of ILD secondary to [place] history significant for chronic diastolic heart failure, aortic valve stenosis, chronic kidney disease and diabetes  TEST/EVENTS :  Echo 01/28/14 >> severe LVH, EF 65 to 34%, grade 2 diastolic dysfx, mild AS, mild/mod LA. HST 02/12/14 >> AHI 59.3, SaO2 low 67% Auto CPAP 03/07/14 to 03/09/14 >> used on 3 of 3 nights with average 8 hrs and 8 min.  Average AHI is 3.3 with median CPAP 12 cm H2O and 95 th percentile CPAP 14 cm H20. PFT 03/12/14 >> FEV1 2.05 (88%), FEV1% 87, TLC 4.14 (75%), DLCO 52%, no BD HRCT chest 03/20/14 >> multiple small nodules, patchy GGO, mild BTX, air trapping  11/11/2021 Follow up : Asthma, OSA  Patient returns for a 2-week follow-up.  She was recently admitted last month for decompensated heart failure and hypercarbic respiratory failure.  She was seen by cardiology and underwent aggressive diuresis.  She did have a right heart cath that showed mild pulmonary hypertension WHO group 3.  She was discharged on oxygen 4 L.  Unfortunately patient declined oxygen and sent back to the office.  O2 saturations in the office last visit showed persistent hypoxemia.  She was restarted back on oxygen 2 L.  Since last visit she is feeling better.  Today patient came in the office without her oxygen.  O2 saturations were 80% on room air. Not wearing oxygen , can not carry heavy  e tanks. Needs poc .  Today in office walk test shows patient is able to maintain O2 saturations on 3 L pulsed oxygen on POC device.  Patient has underlying sleep apnea.  She is on CPAP at bedtime.  Patient says she wears her CPAP every single night.  She feels rested and feels that she benefits from CPAP.  CPAP  download shows 100% compliance.  Daily average usage at 6.5 hours.  Patient is on auto CPAP 7 to 11 cm H2O.  AHI 7.4/hour.  Daily average pressure at 10.8 cm H2O.  No Known Allergies  Immunization History  Administered Date(s) Administered   Fluad Quad(high Dose 65+) 11/28/2018, 01/29/2020   Influenza Split 11/21/2016   Influenza,inj,Quad PF,6+ Mos 01/12/2015, 11/19/2015   PFIZER(Purple Top)SARS-COV-2 Vaccination 03/31/2019, 04/23/2019, 03/21/2020   Pneumococcal Conjugate-13 01/19/2017   Pneumococcal Polysaccharide-23 01/12/2015, 05/14/2020   Zoster Recombinat (Shingrix) 07/14/2021    Past Medical History:  Diagnosis Date   Anxiety    doesn't take any meds   Arthritis of right knee 03/14/2016   Asthma    Back pain    Chronic diastolic CHF (congestive heart failure) ([place])    HF with Preserved EF (60-65%) - Grade II Diastolic Dysfunction (Hypertensive Heart Disease). takes Furosemide daily   Depression    doesn't take meds   Diabetes ([place])    takes Januvia daily   Eczema    uses cream as needed   GERD (gastroesophageal reflux disease)    History of bronchitis as a child    HTN (hypertension)    Insomnia    Mild aortic stenosis by prior echocardiogram 04/2017   Mild stenosis: Mean gradient 15 mmHg, peak gradient 28 mmHg   Mycobacterium avium-intracellulare infection ([place]) 2016   OA (osteoarthritis)    Obese  OSA (obstructive sleep apnea) 02/25/2014   wears CPAP at night   Peripheral neuropathy    takes Gabapentin as needed   Pneumonia    hx of-2010   Seasonal allergies    uses Flonase daily   Sleep apnea     Tobacco History: Social History   Tobacco Use  Smoking Status Former   Packs/day: 0.25   Years: 15.00   Total pack years: 3.75   Types: Cigarettes   Quit date: 02/08/1980   Years since quitting: 41.7  Smokeless Tobacco Former   Counseling given: Not Answered   Outpatient Medications Prior to Visit  Medication Sig Dispense Refill   albuterol  (VENTOLIN HFA) 108 (90 Base) MCG/ACT inhaler INHALE 1-2 PUFFS BY MOUTH into THE lungs every SIX hours AS NEEDED FOR WHEEZING AND/OR SHORTNESS OF BREATH 8.5 g 3   aspirin 81 MG tablet Take 1 tablet (81 mg total) by mouth daily with breakfast. 90 tablet 0   Blood Glucose Monitoring Suppl ([place]) w/Device KIT 1 kit by Does not apply route daily as needed. 1 kit 0   carvedilol (COREG) 25 MG tablet Take 1 tablet (25 mg total) by mouth 2 (two) times daily. 180 tablet 3   cholecalciferol (VITAMIN D3) 25 MCG (1000 UNIT) tablet Take 2,000 Units by mouth daily.     Continuous Blood Gluc Sensor (FREESTYLE LIBRE 14 DAY SENSOR) MISC Place one sensor on the skin every 14 days to monitor blood sugars continuously as directed. 6 each 3   Docusate Sodium 100 MG capsule Take 100 mg by mouth 2 (two) times daily.     ferrous sulfate 325 (65 FE) MG tablet Take 325 mg by mouth 2 (two) times daily with a meal.     fluticasone (FLONASE) 50 MCG/ACT nasal spray instill ONE SPRAY in each nostril daily (Patient taking differently: Place 1 spray into both nostrils daily.) 16 g 3   furosemide (LASIX) 80 MG tablet Take 1 tablet (80 mg total) by mouth daily. 30 tablet 6   gabapentin (NEURONTIN) 100 MG capsule Take 1 capsule (100 mg total) by mouth 2 (two) times daily. 180 capsule 1   glucose blood (ONETOUCH VERIO) test strip Use to monitor blood sugar twice daily as instructed 200 each 3   hydrALAZINE (APRESOLINE) 50 MG tablet Take 1 tablet (50 mg total) by mouth every 8 (eight) hours. 90 tablet 3   isosorbide dinitrate (ISORDIL) 10 MG tablet Take 1 tablet (10 mg total) by mouth 3 (three) times daily. 90 tablet 3   montelukast (SINGULAIR) 10 MG tablet Take 1 tablet (10 mg total) by mouth at bedtime. 90 tablet 3   OneTouch Delica Lancets 54Y MISC Use to monitor blood sugars once daily as directed 100 each 11   pantoprazole (PROTONIX) 40 MG tablet Take 1 tablet (40 mg total) by mouth daily. 30 tablet 0   No  facility-administered medications prior to visit.     Review of Systems:   Constitutional:   No  weight loss, night sweats,  Fevers, chills,  +fatigue, or  lassitude.  HEENT:   No headaches,  Difficulty swallowing,  Tooth/dental problems, or  Sore throat,                No sneezing, itching, ear ache, nasal congestion, post nasal drip,   CV:  No chest pain,  Orthopnea, PND, +swelling in lower extremities, anasarca, dizziness, palpitations, syncope.   GI  No heartburn, indigestion, abdominal pain, nausea, vomiting, diarrhea, change  in bowel habits, loss of appetite, bloody stools.   Resp:   No chest wall deformity  Skin: no rash or lesions.  GU: no dysuria, change in color of urine, no urgency or frequency.  No flank pain, no hematuria   MS:  No joint pain or swelling.  No decreased range of motion.  No back pain.    Physical Exam  BP 108/60 (BP Location: Left Arm, Patient Position: Sitting, Cuff Size: Large)   Pulse 63   Temp 98.1 F (36.7 C) (Oral)   Ht '5\' 8"'  (1.727 m)   Wt (!) 336 lb (152.4 kg)   SpO2 (!) 80% Comment: placed on 2L, sats up to 91%  BMI 51.09 kg/m   GEN: A/Ox3; pleasant , NAD, well nourished, morbidly obese   HEENT:  [place]/AT, NOSE-clear, THROAT-clear, no lesions, no postnasal drip or exudate noted.  Class III MP airway  NECK:  Supple w/ fair ROM; no JVD; normal carotid impulses w/o bruits; no thyromegaly or nodules palpated; no lymphadenopathy.    RESP  Clear  P & A; w/o, wheezes/ rales/ or rhonchi. no accessory muscle use, no dullness to percussion  CARD:  RRR, no m/r/g, tr peripheral edema, pulses intact, no cyanosis or clubbing.  GI:   Soft & nt; nml bowel sounds; no organomegaly or masses detected.   Musco: Warm bil, no deformities or joint swelling noted.   Neuro: alert, no focal deficits noted.    Skin: Warm, no lesions or rashes    Lab Results:      Imaging: CARDIAC CATHETERIZATION  Result Date: 10/15/2021   Hemodynamic findings  consistent with mild pulmonary hypertension. CONCLUSIONS: Mild pulmonary hypertension with mean PA pressure 28 mmHg, capillary wedge pressure mean 13 mmHg, and pulmonary vascular resistance 2.73 [place] units.  Pulmonary hypertension etiology suspected to be WHO group 3. Right atrial pressure is normal with mean of 6 mmHg. Cardiac output 5.5 L/min, index 2.17 L/min/m.         Latest Ref Rng & Units 03/12/2014    8:43 AM  PFT Results  FVC-Pre L 2.37   FVC-Predicted Pre % 80   FVC-Post L 2.35   FVC-Predicted Post % 79   Pre FEV1/FVC % % 87   Post FEV1/FCV % % 87   FEV1-Pre L 2.05   FEV1-Predicted Pre % 88   FEV1-Post L 2.05   DLCO uncorrected ml/min/mmHg 14.84   DLCO UNC% % 52   DLVA Predicted % 72   TLC L 4.14   TLC % Predicted % 75   RV % Predicted % 72     No results found for: "NITRICOXIDE"      Assessment & Plan:   OSA (obstructive sleep apnea) Excellent compliance on CPAP.  We will adjust CPAP pressure to see if we can decrease  her residual events.  Plan  Patient Instructions  Continue on Oxygen 2l/m , goal is to keep Oxygen >88-90% Order for POC  Continue on CPAP At bedtime   Adjust CPAP -auto CPAP 8 to 16 cmH2O.  Activity as tolerated  Continue on Lasix  Albuterol inhaler As needed   Continue on Singulair and Flonase daily  Follow up with .Dr. Halford Chessman or Jonni Oelkers NP  in 4-6  weeks and As needed  Please contact office for sooner follow up if symptoms do not improve or worsen or seek emergency care       Chronic respiratory failure with hypoxia [facility]) Patient is encouraged on oxygen compliance.  Will order  POC to see if this will help her to be more compliant as she is unable to carry heavy tanks. Patient is to use 2 L of oxygen when at home.  If she uses her POC device 23 L pulse.  Plan  Patient Instructions  Continue on Oxygen 2l/m , goal is to keep Oxygen >88-90% Order for POC  Continue on CPAP At bedtime   Adjust CPAP -auto CPAP 8 to 16 cmH2O.  Activity  as tolerated  Continue on Lasix  Albuterol inhaler As needed   Continue on Singulair and Flonase daily  Follow up with .Dr. Halford Chessman or Bernardo Brayman NP  in 4-6  weeks and As needed  Please contact office for sooner follow up if symptoms do not improve or worsen or seek emergency care       CHF (congestive heart failure) ([place]) Appears compensated continue current regimen  ILD (interstitial lung disease) 2/2 MAI s/p med rxn History of MAI continue to follow.  Going forward we will need to set up for spirometry with DLCO and possibly consider CT chest  Plan  Patient Instructions  Continue on Oxygen 2l/m , goal is to keep Oxygen >88-90% Order for POC  Continue on CPAP At bedtime   Adjust CPAP -auto CPAP 8 to 16 cmH2O.  Activity as tolerated  Continue on Lasix  Albuterol inhaler As needed   Continue on Singulair and Flonase daily  Follow up with .Dr. Halford Chessman or Marquon Alcala NP  in 4-6  weeks and As needed  Please contact office for sooner follow up if symptoms do not improve or worsen or seek emergency care     .      Rexene Edison, NP 11/11/2021

## 2021-11-11 NOTE — Assessment & Plan Note (Signed)
Appears compensated continue current regimen

## 2021-11-11 NOTE — Assessment & Plan Note (Signed)
History of MAI continue to follow.  Going forward we will need to set up for spirometry with DLCO and possibly consider CT chest  Plan  Patient Instructions  Continue on Oxygen 2l/m , goal is to keep Oxygen >88-90% Order for POC  Continue on CPAP At bedtime   Adjust CPAP -auto CPAP 8 to 16 cmH2O.  Activity as tolerated  Continue on Lasix  Albuterol inhaler As needed   Continue on Singulair and Flonase daily  Follow up with .Dr. Halford Chessman or Darnell Jeschke NP  in 4-6  weeks and As needed  Please contact office for sooner follow up if symptoms do not improve or worsen or seek emergency care     .

## 2021-11-11 NOTE — Patient Outreach (Signed)
  Care Coordination   Initial Visit Note   11/11/2021 Name: Shantoria Ellwood MRN: [id number] DOB: [birth year]  Jaydalee Bardwell is a 71 y.o. year old female who sees [name], MD for primary care. I spoke with  [name] by phone today.  What matters to the patients health and wellness today?  I want to manage my CHF    Goals Addressed             This Visit's Progress    I want to monitor and manage my CHF       Care Coordination Interventions: Provided education on low sodium diet Advised patient to weigh each morning after emptying bladder Discussed importance of daily weight and advised patient to weigh and record daily Reviewed role of diuretics in prevention of fluid overload and management of heart failure; Discussed the importance of keeping all appointments with provider Active listening / Reflection utilized  Emotional Support Provided Problem [place] strategies reviewed .During my conversation with Mrs. Stann Mainland, she denied experiencing chest pain, shortness of breath, and swelling. We had a detailed discussion about her Congestive Heart Failure (CHF) and the relevant protocols. She has been monitoring her weight daily, and as of today, her weight is 338lbs. I was informed that she is taking her medications regularly, and on her oxygen 2 liters.   Mrs. Tortorella has confirmed that she is using her Bipap machine at night and has been monitoring her oxygen saturation levels. Her oxygen saturation levels have been ranging from 90% to 94%. She has appointments scheduled on October 5th with both Pulmonology and Family Medicine.I spoke with Mrs. Stann Mainland, and she denies chest pain, shortness of breath, and swelling. We discussed her CHF and the protocols. She weighs daily. Her weight today was 338lbs. She is taking her medications and on her oxygen 2 liters.  She states that she is using her Bipap at night. She is also checking her o2 sat, and it has been  ranging from 90-94%.  She has an appointment on 10/5 with Pulmonology and Family Medicine.          SDOH assessments and interventions completed:  No     Care Coordination Interventions Activated:  Yes  Care Coordination Interventions:  Yes, provided   Follow up plan: Follow up call scheduled for 11/7 1030 am    Encounter Outcome:  Pt. Visit Completed   Lazaro Arms RN, BSN, [facility]   Phone: [phone]

## 2021-11-11 NOTE — Patient Instructions (Signed)
Visit Information  Thank you for taking time to visit with me today. Please don't hesitate to contact me if I can be of assistance to you.   Following are the goals we discussed today:   Goals Addressed             This Visit's Progress    I want to monitor and manage my CHF       Care Coordination Interventions: Provided education on low sodium diet Advised patient to weigh each morning after emptying bladder Discussed importance of daily weight and advised patient to weigh and record daily Reviewed role of diuretics in prevention of fluid overload and management of heart failure; Discussed the importance of keeping all appointments with provider Active listening / Reflection utilized  Emotional Support Provided Problem [place] strategies reviewed .During my conversation with Stacey Page, she denied experiencing chest pain, shortness of breath, and swelling. We had a detailed discussion about her Congestive Heart Failure (CHF) and the relevant protocols. She has been monitoring her weight daily, and as of today, her weight is 338lbs. I was informed that she is taking her medications regularly, and on her oxygen 2 liters.   Stacey Page has confirmed that she is using her Bipap machine at night and has been monitoring her oxygen saturation levels. Her oxygen saturation levels have been ranging from 90% to 94%. She has appointments scheduled on October 5th with both Pulmonology and Family Medicine.I spoke with Stacey Page, and she denies chest pain, shortness of breath, and swelling. We discussed her CHF and the protocols. She weighs daily. Her weight today was 338lbs. She is taking her medications and on her oxygen 2 liters.  She states that she is using her Bipap at night. She is also checking her o2 sat, and it has been ranging from 90-94%.  She has an appointment on 10/5 with Pulmonology and Family Medicine.          Our next appointment is by telephone on 11/7 at  1030 am  Please call the care guide team at [phone] if you need to cancel or reschedule your appointment.   If you are experiencing a Mental Health or [place] or need someone to talk to, please call 1-800-273-TALK (toll free, 24 hour hotline)  Patient verbalizes understanding of instructions and care plan provided today and agrees to view in [place]. Active MyChart status and patient understanding of how to access instructions and care plan via MyChart confirmed with patient.     Lazaro Arms RN, BSN, [facility]   Phone: [phone]

## 2021-11-11 NOTE — Assessment & Plan Note (Signed)
Excellent compliance on CPAP.  We will adjust CPAP pressure to see if we can decrease  her residual events.  Plan  Patient Instructions  Continue on Oxygen 2l/m , goal is to keep Oxygen >88-90% Order for POC  Continue on CPAP At bedtime   Adjust CPAP -auto CPAP 8 to 16 cmH2O.  Activity as tolerated  Continue on Lasix  Albuterol inhaler As needed   Continue on Singulair and Flonase daily  Follow up with .Dr. Halford Chessman or Rayane Gallardo NP  in 4-6  weeks and As needed  Please contact office for sooner follow up if symptoms do not improve or worsen or seek emergency care

## 2021-11-11 NOTE — Assessment & Plan Note (Signed)
Patient is encouraged on oxygen compliance.  Will order POC to see if this will help her to be more compliant as she is unable to carry heavy tanks. Patient is to use 2 L of oxygen when at home.  If she uses her POC device 23 L pulse.  Plan  Patient Instructions  Continue on Oxygen 2l/m , goal is to keep Oxygen >88-90% Order for POC  Continue on CPAP At bedtime   Adjust CPAP -auto CPAP 8 to 16 cmH2O.  Activity as tolerated  Continue on Lasix  Albuterol inhaler As needed   Continue on Singulair and Flonase daily  Follow up with .Dr. Halford Chessman or Annelies Coyt NP  in 4-6  weeks and As needed  Please contact office for sooner follow up if symptoms do not improve or worsen or seek emergency care

## 2021-11-11 NOTE — Patient Instructions (Addendum)
Continue on Oxygen 2l/m , goal is to keep Oxygen >88-90% Order for POC  Continue on CPAP At bedtime   Adjust CPAP -auto CPAP 8 to 16 cmH2O.  Activity as tolerated  Continue on Lasix  Albuterol inhaler As needed   Continue on Singulair and Flonase daily  Follow up with .Dr. Halford Chessman or Delno Blaisdell NP  in 4-6  weeks and As needed  Please contact office for sooner follow up if symptoms do not improve or worsen or seek emergency care

## 2021-11-12 ENCOUNTER — Encounter: Payer: Self-pay | Admitting: Family Medicine

## 2021-11-12 ENCOUNTER — Ambulatory Visit (INDEPENDENT_AMBULATORY_CARE_PROVIDER_SITE_OTHER): Payer: No Typology Code available for payment source | Admitting: Family Medicine

## 2021-11-12 ENCOUNTER — Telehealth: Payer: Self-pay

## 2021-11-12 VITALS — BP 142/68 | HR 58 | Temp 98.2°F | Ht 68.0 in | Wt 338.8 lb

## 2021-11-12 DIAGNOSIS — N184 Chronic kidney disease, stage 4 (severe): Secondary | ICD-10-CM

## 2021-11-12 DIAGNOSIS — E1122 Type 2 diabetes mellitus with diabetic chronic kidney disease: Secondary | ICD-10-CM | POA: Diagnosis not present

## 2021-11-12 NOTE — Progress Notes (Signed)
   Established Patient Office Visit  Subjective   Patient ID: Stacey Page, female    DOB: Jul 30, 1950  Age: 71 y.o. MRN: [id number]  Chief Complaint  Patient presents with   Advice Only    Discuss forms for diabetic shoes.     HPI to fill out forms for diabetic shoes and supplies.  Had tried Sitagliptin but developed hypoglycemia.  He is currently not taking anything for diabetes.  Last hemoglobin A1c 7 months ago was 6.4.  Last GFR 3 weeks ago was 21.  She is also being followed by nephrology.  She continues to produce urine.    ROS    Objective:     BP (!) 142/68 (BP Location: Right Arm, Patient Position: Sitting, Cuff Size: Large)   Pulse (!) 58   Temp 98.2 F (36.8 C) (Temporal)   Ht '5\' 8"'$  (1.727 m)   Wt (!) 338 lb 12.8 oz (153.7 kg)   SpO2 92%   BMI 51.51 kg/m    Physical Exam   No results found for any visits on 11/12/21.    The ASCVD Risk score (Arnett DK, et al., 2019) failed to calculate for the following reasons:   The valid total cholesterol range is 130 to 320 mg/dL    Assessment & Plan:   Problem List Items Addressed This Visit       Endocrine   Diabetes mellitus ([place]) - Primary   Relevant Orders   Hemoglobin U2P   Basic metabolic panel     Genitourinary   CKD (chronic kidney disease) stage 4, GFR 15-29 ml/min (HCC)   Relevant Orders   Basic metabolic panel    No follow-ups on file.  Adjustments have been made to diabetic regimen pending hemoglobin A1c results.  [name], MD

## 2021-11-12 NOTE — Addendum Note (Signed)
Addended by: Vanessa Barbara on: 11/12/2021 08:37 AM   Modules accepted: Orders

## 2021-11-12 NOTE — Progress Notes (Signed)
Reviewed and agree with assessment/plan.   Chesley Mires, MD [facility] Pulmonary/Critical Care 11/12/2021, 8:16 AM Pager:  [phone]

## 2021-11-12 NOTE — Progress Notes (Signed)
Patient requesting a refill for libre sensors to be deliver on 11/12/2021 as she only has enough to get her to Monday.  Acute submitted and I notified [facility] of request.  [name] Clinical Pharmacist Assistant [phone]

## 2021-11-13 LAB — BASIC METABOLIC PANEL
BUN/Creatinine Ratio: 12 (ref 12–28)
BUN: 20 mg/dL (ref 8–27)
CO2: 30 mmol/L — ABNORMAL HIGH (ref 20–29)
Calcium: 9.2 mg/dL (ref 8.7–10.3)
Chloride: 99 mmol/L (ref 96–106)
Creatinine, Ser: 1.64 mg/dL — ABNORMAL HIGH (ref 0.57–1.00)
Glucose: 98 mg/dL (ref 70–99)
Potassium: 3.8 mmol/L (ref 3.5–5.2)
Sodium: 143 mmol/L (ref 134–144)
eGFR: 34 mL/min/{1.73_m2} — ABNORMAL LOW (ref >59)

## 2021-11-13 LAB — HEMOGLOBIN A1C
Est. average glucose Bld gHb Est-mCnc: 134 mg/dL
Hgb A1c MFr Bld: 6.3 % — ABNORMAL HIGH (ref 4.8–5.6)

## 2021-11-14 ENCOUNTER — Encounter: Payer: Self-pay | Admitting: Podiatry

## 2021-11-14 NOTE — Progress Notes (Signed)
  Subjective:  Patient ID: Stacey Page, female    DOB: Jan 11, 1951,  MRN: [id number]  Stacey Page presents to clinic today for:  Chief Complaint  Patient presents with   Diabetes    Diabetic foot care, A1c-6.1 BG- 98 PCP- last seen 2 weeks ago, Nail trim and callus    New problem(s): None.   Patient is requesting Rx for diabetic shoes on today's visit.  PCP is [name], MD , and last visit was  October 19, 2021.  No Known Allergies  Review of Systems: Negative except as noted in the HPI.  Objective: No changes noted in today's physical examination.  Stacey Page is a pleasant 71 y.o. female morbidly obese in NAD. AAO x 3.  Vascular Examination: CFT <3 seconds b/l. DP/PT pulses faintly palpable b/l. Skin temperature gradient warm to warm b/l. No ischemia or gangrene. No cyanosis or clubbing noted b/l. Pedal hair present.    Neurological Examination: Sensation grossly intact b/l with 10 gram monofilament. Vibratory sensation intact b/l.    Dermatological Examination: Pedal integument with normal turgor, texture and tone b/l LE. No open wounds b/l. No interdigital macerations b/l. Toenails 1-5 b/l elongated, thickened, discolored with subungual debris. +Tenderness with dorsal palpation of nailplates.   Hyperkeratotic lesion(s) bilateral heels.  No erythema, no edema, no drainage, no fluctuance.   Musculoskeletal Examination: Muscle strength 5/5 to b/l LE. Hammertoe deformity noted 2-5 b/l. Pes planus deformity noted bilateral LE.   Radiographs: None  Assessment/Plan: 1. Pain due to onychomycosis of toenails of both feet   2. Callus   3. Pes planus of both feet   4. Diabetic peripheral neuropathy associated with type 2 diabetes mellitus ([place])   5. Encounter for diabetic foot exam ([place])     No orders of the defined types were placed in this encounter.   -Patient was evaluated and treated. All patient's and/or POA's questions/concerns answered on  today's visit. -Rx written for one pair extra depth shoes with 3 pair of heat moldable insoles. -Mycotic toenails 1-5 bilaterally were debrided in length and girth with sterile nail nippers and dremel without incident. -Callus(es) bilateral heels pared utilizing rotary bur without complication or incident. Total number pared =2. -Patient/POA to call should there be question/concern in the interim.   Return in about 9 weeks (around 01/12/2022).  Marzetta Board, DPM

## 2021-11-15 ENCOUNTER — Telehealth: Payer: Self-pay | Admitting: Family Medicine

## 2021-11-15 MED ORDER — EMPAGLIFLOZIN 10 MG PO TABS: 10.0000 mg | ORAL_TABLET | Freq: Every day | ORAL | 2 refills | Status: DC

## 2021-11-15 NOTE — Telephone Encounter (Signed)
Pt called and stated that the company received the fax but you didi not answers questions 1,2, and 3. Pt stated that it must be answer before they proceed. Pt said after you answer the questions that's unanswered you can fax it back

## 2021-11-15 NOTE — Addendum Note (Signed)
Addended by: Jon Billings on: 11/15/2021 08:20 AM   Modules accepted: Orders

## 2021-11-15 NOTE — Telephone Encounter (Signed)
Caller Name: Barnie Alderman Call back phone # [phone]  Reason for Call  [facility] calling from [facility]. He would like to speak with you about request form for urgent lab work required for immunodeficiency that was faxed 10/3. He states messages have been sent multiple times with no response and would like to speak with med asst.

## 2021-11-15 NOTE — Telephone Encounter (Signed)
Pt is wanting a cb when this is completed @ [phone].

## 2021-11-15 NOTE — Telephone Encounter (Signed)
Please call pt,  She is also wanting her empagliflozin (JARDIANCE) 10 MG TABS tablet [[id number]] sent to  [facility]  Address: [address],  [place], [place] [address] Phone: [phone]

## 2021-11-15 NOTE — Telephone Encounter (Signed)
Caller Name: Lissete Maestas Call back phone #: [phone]  Reason for Call: Pt was unaware she would be receiving a medication for diabetes, please call to answer some questions

## 2021-11-15 NOTE — Addendum Note (Signed)
Addended by: Vanessa Barbara on: 11/15/2021 08:59 AM   Modules accepted: Orders

## 2021-11-16 ENCOUNTER — Encounter: Payer: Self-pay | Admitting: Family Medicine

## 2021-11-16 ENCOUNTER — Ambulatory Visit (INDEPENDENT_AMBULATORY_CARE_PROVIDER_SITE_OTHER): Payer: No Typology Code available for payment source | Admitting: Family Medicine

## 2021-11-16 DIAGNOSIS — N184 Chronic kidney disease, stage 4 (severe): Secondary | ICD-10-CM | POA: Diagnosis not present

## 2021-11-16 DIAGNOSIS — E1122 Type 2 diabetes mellitus with diabetic chronic kidney disease: Secondary | ICD-10-CM | POA: Diagnosis not present

## 2021-11-16 MED ORDER — EMPAGLIFLOZIN 10 MG PO TABS: 10.0000 mg | ORAL_TABLET | Freq: Every day | ORAL | 2 refills | Status: DC

## 2021-11-16 NOTE — Telephone Encounter (Signed)
Returned Rafael's call no answer LMTCB.

## 2021-11-16 NOTE — Progress Notes (Signed)
Established Patient Office Visit  Subjective   Patient ID: Stacey Page, female    DOB: [birth year]  Age: 71 y.o. MRN: [id number]  Chief Complaint  Patient presents with   Medication Consultation    Pt wants to discuss new meds and papers for disability shoes    HPI for follow-up of prediabetes/type 2 diabetes with a history of CKD stage IV.  We discussed this medicine at our last visit.  After her last hemoglobin A1c increased to 6.3 prescription was sent to the pharmacy.  Stacey Page has not started it yet.  Stacey Page has a follow-up with nephrology next month.    Review of Systems  Constitutional: Negative.   HENT: Negative.    Eyes:  Negative for blurred vision, discharge and redness.  Respiratory: Negative.    Cardiovascular: Negative.   Gastrointestinal:  Negative for abdominal pain.  Genitourinary: Negative.   Musculoskeletal: Negative.  Negative for myalgias.  Skin:  Negative for rash.  Neurological:  Negative for tingling, loss of consciousness and weakness.  Endo/Heme/Allergies:  Negative for polydipsia.      Objective:     BP (!) 144/60 (BP Location: Left Arm, Patient Position: Sitting, Cuff Size: Large)   Pulse 64   Temp 98 F (36.7 C) (Oral)   Ht '5\' 8"'$  (1.727 m)   Wt (!) 338 lb (153.3 kg)   SpO2 91%   BMI 51.39 kg/m    Physical Exam Constitutional:      General: Stacey Page is not in acute distress.    Appearance: Normal appearance. Stacey Page is not ill-appearing, toxic-appearing or diaphoretic.  HENT:     Head: Normocephalic and atraumatic.     Right Ear: External ear normal.     Left Ear: External ear normal.  Eyes:     General: No scleral icterus.       Right eye: No discharge.        Left eye: No discharge.     Extraocular Movements: Extraocular movements intact.     Conjunctiva/sclera: Conjunctivae normal.  Pulmonary:     Effort: Pulmonary effort is normal. No respiratory distress.  Skin:    General: Skin is warm and dry.  Neurological:     Mental Status:  Stacey Page is alert and oriented to person, place, and time.  Psychiatric:        Mood and Affect: Mood normal.        Behavior: Behavior normal.      No results found for any visits on 11/16/21.    The ASCVD Risk score (Arnett DK, et al., 2019) failed to calculate for the following reasons:   The valid total cholesterol range is 130 to 320 mg/dL    Assessment & Plan:   Problem List Items Addressed This Visit       Endocrine   Diabetes mellitus ([place])   Relevant Medications   empagliflozin (JARDIANCE) 10 MG TABS tablet     Genitourinary   CKD (chronic kidney disease) stage 4, GFR 15-29 ml/min (HCC)   Relevant Medications   empagliflozin (JARDIANCE) 10 MG TABS tablet    Return in about 4 weeks (around 12/14/2021).  [facility]  We will see her back in 4 weeks for recheck.  Discussed treatment of hypoglycemia with blood sugars less than 70.  Take 2 glucose tablets wait 15 minutes and recheck blood sugar.  If sugar remains under 70 take 2 more glucose tablets and wait 15 minutes.  Continue and repeat until blood sugar stabilizes over  [place]  [name], MD

## 2021-11-16 NOTE — Telephone Encounter (Signed)
Spoke with Barnie Alderman who verbally understood forms were received and are still pending waiting to be viewed and signed by Provider.

## 2021-11-17 ENCOUNTER — Telehealth: Payer: Self-pay

## 2021-11-17 ENCOUNTER — Telehealth: Payer: Self-pay | Admitting: Family Medicine

## 2021-11-17 ENCOUNTER — Telehealth: Payer: Self-pay | Admitting: Adult Health

## 2021-11-17 ENCOUNTER — Telehealth: Payer: Self-pay | Admitting: Pulmonary Disease

## 2021-11-17 DIAGNOSIS — J9611 Chronic respiratory failure with hypoxia: Secondary | ICD-10-CM

## 2021-11-17 NOTE — Telephone Encounter (Signed)
Okay to send order. 

## 2021-11-17 NOTE — Telephone Encounter (Signed)
Called and spoke to [employer] and told them I was placing order for home concentrator now, since Dr Halford Chessman approved the request. She verbalized understanding. Order placed for home oxygen concentrator. Nothing further needed

## 2021-11-17 NOTE — Telephone Encounter (Signed)
Pt is wanting a cb from [facility] to discuss her medication. Please advise pt @ [phone]

## 2021-11-17 NOTE — Telephone Encounter (Signed)
Patient called to get the status of her portable oxygen tank.  She stated that [employer] told her she needed to contact her doctor.  Patient said she really needs to get the tank asap.  Please advise and call patient to get the status of the order at [phone]

## 2021-11-17 NOTE — Telephone Encounter (Signed)
Called and spoke with rep from [employer]. She confirmed that they have the oxygen order as well as the cpap machine order. However, they need an order for a home concentrator. Once they have this, they will be able to deliver her POC and equipment.   Dr. Halford Chessman, would you be willing to sign for the additional oxygen order since [facility] is not here this week?

## 2021-11-17 NOTE — Progress Notes (Signed)
Patient has concerns regarding her Elenor Legato deceive , and states she is getting "red danger". Patient states she checks her blood sugar with the finger stick and libre deceive and they do not match up.Patient reports the Elenor Legato has much higher results, and would like to know if the clinical pharmacist is checking on her blood sugar readings. Notified Clinical pharmacist.   Per Clinical pharmacist, I do have access to her Elenor Legato results, but it does not send me alerts of what is happening with her blood sugars. I reviewed her last 2 weeks and I see that her sensor has been having frequent hypoglycemia warnings. In general the Freestyle Elenor Legato is accurate but it is possible she has a faulty sensor if she is seeing significantly different readings on her traditional meter. I will plan to call her at 44 tomorrow to discuss more.      For now, she should trust her traditional meter as the most accurate result.   Patient verbalized understanding, and she is aware the clinical pharmacist will call her tomorrow at 11:00 am.  [place] Pharmacist Assistant [phone]

## 2021-11-18 ENCOUNTER — Ambulatory Visit (INDEPENDENT_AMBULATORY_CARE_PROVIDER_SITE_OTHER): Payer: No Typology Code available for payment source

## 2021-11-18 DIAGNOSIS — I110 Hypertensive heart disease with heart failure: Secondary | ICD-10-CM

## 2021-11-18 DIAGNOSIS — E1151 Type 2 diabetes mellitus with diabetic peripheral angiopathy without gangrene: Secondary | ICD-10-CM

## 2021-11-18 NOTE — Progress Notes (Signed)
Chronic Care Management Pharmacy Note  11/22/2021 Name:  Stacey Page MRN:  [id number] DOB:  [birth year]  Summary: Patient presents for CCM follow-up.   -Patient experiencing false alarms due to low blood sugar on her freestyle libre. Counseled to always verify with traditional glucometer if readings in question. Suspect current sensor may be defective and to replace.   Recommendations/Changes made from today's visit: -Continue current medications START PAP for Jardiance   Plan: CPP follow-up 3 months  Subjective: Stacey Page is an 71 y.o. year old female who is a primary patient of Ethelene Hal, Mortimer Fries, MD.  The CCM team was consulted for assistance with disease management and care coordination needs.    Engaged with patient by telephone for follow up visit to re-establish care in response to provider referral for pharmacy case management and/or care coordination services.   Consent to Services:  The patient was given information about Chronic Care Management services, agreed to services, and gave verbal consent prior to initiation of services.  Please see initial visit note for detailed documentation.   Patient Care Team: [name], MD as PCP - General (Family Medicine) Leonie Man, MD as PCP - Cardiology (Cardiology) Germaine Pomfret, [facility] as Pharmacist (Pharmacist) Rob Hickman Tami Lin, [place] as Physician Assistant (Cardiology)  Recent office visits: 07/22/21: Patient presented to Dr. Ethelene Hal for follow-up.  07/14/21: Patient presented to Dr. Ethelene Hal for follow-up.   Recent consult visits: 07/19/21: Patient presented to Mina Marble, NP (Weight Management) for follow-up.    Hospital visits: None in previous 6 months   Objective:  Lab Results  Component Value Date   CREATININE 1.64 (H) 11/12/2021   BUN 20 11/12/2021   GFR 26.66 (L) 02/16/2021   EGFR 34 (L) 11/12/2021   GFRNONAA 21 (L) 10/16/2021   GFRAA 40 (L) 10/28/2019   NA 143  11/12/2021   K 3.8 11/12/2021   CALCIUM 9.2 11/12/2021   CO2 30 (H) 11/12/2021   GLUCOSE 98 11/12/2021    Lab Results  Component Value Date/Time   HGBA1C 6.3 (H) 11/12/2021 03:08 PM   HGBA1C 6.0 (A) 07/14/2021 11:25 AM   HGBA1C 6.4 (A) 04/14/2021 08:56 AM   HGBA1C 6.1 (H) 10/14/2020 10:57 AM   GFR 26.66 (L) 02/16/2021 08:03 AM   GFR 29.58 (L) 05/21/2020 08:26 AM   MICROALBUR 1.4 05/14/2020 09:10 AM   MICROALBUR 1.9 01/16/2018 11:17 AM    Last diabetic Eye exam:  Lab Results  Component Value Date/Time   HMDIABEYEEXA No Retinopathy 11/05/2020 12:00 AM    Last diabetic Foot exam:  Lab Results  Component Value Date/Time   HMDIABFOOTEX normal 09/10/2018 12:00 AM     Lab Results  Component Value Date   CHOL 116 05/14/2020   HDL 51.60 05/14/2020   LDLCALC 52 05/14/2020   TRIG 61.0 05/14/2020   CHOLHDL 2 05/14/2020       Latest Ref Rng & Units 07/14/2021    9:40 AM 06/25/2021   12:00 AM 04/13/2021    9:34 AM  Hepatic Function  Total Protein 6.5 - 8.1 g/dL 7.6   7.6   Albumin 3.5 - 5.0 g/dL 3.9  4.1     3.8   AST 15 - 41 U/L 10   10   ALT 0 - 44 U/L 7   8   Alk Phosphatase 38 - 126 U/L 65   67   Total Bilirubin 0.3 - 1.2 mg/dL 0.3   0.4      This result is  from an external source.    Lab Results  Component Value Date/Time   TSH 0.51 11/28/2018 10:42 AM   TSH 0.81 01/19/2017 10:13 AM       Latest Ref Rng & Units 10/15/2021    1:04 PM 10/15/2021    7:36 AM 10/14/2021    9:16 AM  CBC  WBC 4.0 - 10.5 K/uL  3.1  3.6   Hemoglobin 12.0 - 15.0 g/dL 12.0 - 15.0 g/dL 13.3    13.3  12.5  12.7   Hematocrit 36.0 - 46.0 % 36.0 - 46.0 % 39.0    39.0  41.3  41.8   Platelets 150 - 400 K/uL  171  172     Lab Results  Component Value Date/Time   VD25OH 57.6 10/28/2019 09:31 AM   VD25OH 41.5 07/04/2019 12:40 PM    Clinical ASCVD: No  The ASCVD Risk score (Arnett DK, et al., 2019) failed to calculate for the following reasons:   The valid total cholesterol range is 130 to  320 mg/dL       11/16/2021    3:36 PM 11/12/2021    1:54 PM 11/03/2021    3:36 PM  Depression screen PHQ 2/9  Decreased Interest 0 1 0  Down, Depressed, Hopeless 0 0 0  PHQ - 2 Score 0 1 0    Social History   Tobacco Use  Smoking Status Former   Packs/day: 0.25   Years: 15.00   Total pack years: 3.75   Types: Cigarettes   Quit date: 02/08/1980   Years since quitting: 41.8  Smokeless Tobacco Former   BP Readings from Last 3 Encounters:  11/16/21 (!) 144/60  11/12/21 (!) 142/68  11/11/21 (!) 142/70   Pulse Readings from Last 3 Encounters:  11/16/21 64  11/12/21 (!) 58  11/11/21 77   Wt Readings from Last 3 Encounters:  11/16/21 (!) 338 lb (153.3 kg)  11/12/21 (!) 338 lb 12.8 oz (153.7 kg)  11/11/21 (!) 335 lb (152 kg)   BMI Readings from Last 3 Encounters:  11/16/21 51.39 kg/m  11/12/21 51.51 kg/m  11/11/21 50.94 kg/m    Assessment/Interventions: Review of patient past medical history, allergies, medications, health status, including review of consultants reports, laboratory and other test data, was performed as part of comprehensive evaluation and provision of chronic care management services.   SDOH:  (Social Determinants of Health) assessments and interventions performed: Yes SDOH Interventions    Flowsheet Row Clinical Support from 08/17/2021 in [place] Management from 04/29/2021 in [place] Management from 10/21/2020 in [place] from 08/04/2020 in [place] Patient Outreach from 07/01/2014 in [place] Interventions Intervention Not Indicated -- Other (Comment)  [referred to care guide for food resource assistance.] Intervention Not Indicated --  Housing Interventions Intervention Not Indicated -- -- Intervention Not Indicated --  Transportation Interventions  Intervention Not Indicated -- Intervention Not Indicated -- --  Depression Interventions/Treatment  -- -- -- -- --  [states that she dose not need any treatment for depression at this time]  Financial Strain Interventions Intervention Not Indicated Intervention Not Indicated -- Intervention Not Indicated --  Physical Activity Interventions Intervention Not Indicated -- -- Intervention Not Indicated --  Stress Interventions Intervention Not Indicated -- -- Intervention Not Indicated --  Social Connections Interventions Intervention Not Indicated -- -- Intervention Not Indicated --  SDOH Screenings   Food Insecurity: No Food Insecurity (08/17/2021)  Housing: Low Risk  (08/17/2021)  Transportation Needs: No Transportation Needs (08/17/2021)  Alcohol Screen: Low Risk  (08/17/2021)  Depression (PHQ2-9): Low Risk  (11/16/2021)  Financial Resource Strain: Low Risk  (08/17/2021)  Physical Activity: Insufficiently Active (08/17/2021)  Social Connections: Socially Isolated (08/17/2021)  Stress: No Stress Concern Present (08/17/2021)  Tobacco Use: Medium Risk (11/16/2021)    [place]  No Known Allergies  Medications Reviewed Today     Reviewed by [name], MD (Physician) on 11/16/21 at 1622  Med List Status: <None>   Medication Order Taking? Sig Documenting Provider Last Dose Status Informant  albuterol (VENTOLIN HFA) 108 (90 Base) MCG/ACT inhaler [id number] Yes INHALE 1-2 PUFFS BY MOUTH into THE lungs every SIX hours AS NEEDED FOR WHEEZING AND/OR SHORTNESS OF BREATH [name], [name], NP Taking Active   aspirin 81 MG tablet [id number] Yes Take 1 tablet (81 mg total) by mouth daily with breakfast. [name], [name], NP Taking Active Self  Blood Glucose Monitoring Suppl (ONETOUCH VERIO [facility] SYSTEM) w/Device KIT [id number] Yes 1 kit by Does not apply route daily as needed. [name], MD Taking Active Self  carvedilol (COREG) 25 MG tablet [id number] Yes Take 1  tablet (25 mg total) by mouth 2 (two) times daily. Leonie Man, MD Taking Active Self  cholecalciferol (VITAMIN D3) 25 MCG (1000 UNIT) tablet [id number] Yes Take 2,000 Units by mouth daily. [provider] Taking Active Self  Continuous Blood Gluc Sensor (FREESTYLE LIBRE 14 DAY SENSOR) [place] [id number] Yes Place one sensor on the skin every 14 days to monitor blood sugars continuously as directed. [name], MD Taking Active Self  Docusate Sodium 100 MG capsule [id number] Yes Take 100 mg by mouth 2 (two) times daily. [provider] Taking Active Self  empagliflozin (JARDIANCE) 10 MG TABS tablet [id number]  Take 1 tablet (10 mg total) by mouth daily before breakfast. [name], MD  Active   ferrous sulfate 325 (65 FE) MG tablet [id number] Yes Take 325 mg by mouth 2 (two) times daily with a meal. [provider] Taking Active Self  fluticasone (FLONASE) 50 MCG/ACT nasal spray [id number] Yes instill ONE SPRAY in each nostril daily  Patient taking differently: Place 1 spray into both nostrils daily.   [name], [name], NP Taking Active Self  furosemide (LASIX) 80 MG tablet [id number] Yes Take 1 tablet (80 mg total) by mouth daily. Regalado, Belkys A, MD Taking Active   gabapentin (NEURONTIN) 100 MG capsule [id number] Yes Take 1 capsule (100 mg total) by mouth 2 (two) times daily. Flossie Buffy, NP Taking Active Self  glucose blood (ONETOUCH VERIO) test strip [id number] Yes Use to monitor blood sugar twice daily as instructed [name], MD Taking Active Self  hydrALAZINE (APRESOLINE) 50 MG tablet [id number] Yes Take 1 tablet (50 mg total) by mouth every 8 (eight) hours. Regalado, Belkys A, MD Taking Active   isosorbide dinitrate (ISORDIL) 10 MG tablet [id number] Yes Take 1 tablet (10 mg total) by mouth 3 (three) times daily. Regalado, Belkys A, MD Taking Active   montelukast (SINGULAIR) 10 MG tablet [id number] Yes Take 1 tablet (10 mg  total) by mouth at bedtime. [name], [name], NP Taking Active Self  OneTouch Delica Lancets 23X MISC [id number] Yes Use to monitor blood sugars once daily as directed [name], MD Taking Active Self  pantoprazole (PROTONIX) 40 MG tablet [id number] Yes Take 1  tablet (40 mg total) by mouth daily. Elmarie Shiley, MD Taking Active             Patient Active Problem List   Diagnosis Date Noted   Chronic respiratory failure with hypoxia ([place]) 11/11/2021   Demand ischemia    Respiratory acidosis    Acute respiratory failure with hypercapnia (HCC)    Acute on chronic heart failure with preserved ejection fraction (HCC)    CHF (congestive heart failure) ([place]) 10/09/2021   Reactive airway disease 09/21/2021   Viral syndrome 09/21/2021   Hypertensive retinopathy of right eye, grade 1 08/11/2021   Grade 2 hypertensive retinopathy, left 08/11/2021   Nuclear sclerotic cataract of both eyes 08/11/2021   Need for shingles vaccine 07/14/2021   Iron deficiency anemia 01/13/2021   CKD (chronic kidney disease) stage 4, GFR 15-29 ml/min ([place]) 07/09/2019   Diabetes mellitus type 2 in obese ([place]) 07/02/2019   Polyneuropathy associated with underlying disease ([place]) 11/28/2018   Severe episode of recurrent major depressive disorder, without psychotic features ([place]) 11/28/2018   Type 2 diabetes mellitus with diabetic peripheral angiopathy without gangrene, without long-term current use of insulin ([place]) 11/28/2018   Poor short term memory 11/28/2018   Moderate aortic stenosis by prior echocardiogram 11/06/2018   Diabetes mellitus ([place]) 03/21/2017   Acute renal failure superimposed on stage 2 chronic kidney disease ([place]) 03/17/2016   Symptomatic anemia 03/17/2016   S/P total knee arthroplasty, right 03/17/2016   Unilateral primary osteoarthritis, right knee    Hyperlipidemia associated with type 2 diabetes mellitus ([place]) 12/15/2015   Pain in both lower extremities 11/19/2015   Numbness  in both hands 08/13/2015   Panic attack 06/09/2015   Right knee pain 05/14/2015   Chronic renal insufficiency 09/16/2014   Pneumothorax on right    Atypical mycobacterium infection    ILD (interstitial lung disease) 2/2 MAI s/p med rxn    Class 3 severe obesity with serious comorbidity and body mass index (BMI) of 50.0 to 59.9 in adult ([place]) 04/03/2014   OSA (obstructive sleep apnea) 02/25/2014   Hypertensive heart disease with chronic diastolic congestive heart failure ([place]) 02/25/2014   DOE (dyspnea on exertion) 02/25/2014   Essential hypertension 02/25/2014    Immunization History  Administered Date(s) Administered   Fluad Quad(high Dose 65+) 11/28/2018, 01/29/2020   Influenza Split 11/21/2016   Influenza,inj,Quad PF,6+ Mos 01/12/2015, 11/19/2015   PFIZER(Purple Top)SARS-COV-2 Vaccination 03/31/2019, 04/23/2019, 03/21/2020   Pneumococcal Conjugate-13 01/19/2017   Pneumococcal Polysaccharide-23 01/12/2015, 05/14/2020   Zoster Recombinat (Shingrix) 07/14/2021    Conditions to be addressed/monitored:  Hypertension, Hyperlipidemia, Diabetes, Heart Failure, Chronic Kidney Disease, and Allergic Rhinitis  Care Plan : General Pharmacy (Adult)  Updates made by Germaine Pomfret, RPH since 11/22/2021 12:00 AM     Problem: Hypertension, Hyperlipidemia, Diabetes, Heart Failure, Chronic Kidney Disease, and Allergic Rhinitis   Priority: High     Long-Range Goal: Patient-Specific Goal   Start Date: 04/29/2021  Expected End Date: 04/30/2022  This Visit's Progress: On track  Recent Progress: On track  Priority: High  Note:   Current Barriers:  Unable to independently afford treatment regimen  Pharmacist Clinical Goal(s):  Patient will verbalize ability to afford treatment regimen maintain control of diabetes as evidenced by A1c less than 7%  through collaboration with PharmD and provider.   Interventions: 1:1 collaboration with [name], [name], NP regarding development and  update of comprehensive plan of care as evidenced by provider attestation and co-signature Inter-disciplinary care team collaboration (see longitudinal  plan of care) Comprehensive medication review performed; medication list updated in electronic medical record  Heart Failure (Goal: manage symptoms and prevent exacerbations) -Controlled -Last ejection fraction: 60-65% (Date: 2021) -HF type: Diastolic -NYHA Class: II (slight limitation of activity) -Current treatment: Carvedilol 25 mg twice daily: Appropriate, Effective, Safe, Accessible Furosemide 40 mg 3-4 times weekly: Appropriate, Effective, Safe, Accessible  Hydralazine 50 mg three times daily: Appropriate, Effective, Safe, Accessible  Isosorbide dinitrate 20 mg twice daily  Lisinopril 20 mg daily: Appropriate, Effective, Safe, Accessible  -Medications previously tried: NA  -Recommended to continue current medication  Hyperlipidemia: (LDL goal < 70) -Controlled -Current treatment: Atorvastatin 20 mg daily: Appropriate, Effective, Safe, Accessible  -Medications previously tried: NA  -Recommended to continue current medication  Diabetes (A1c goal <7%) -Controlled -Current medications: -Medications previously tried: Ozempic (Cost), Januvia   -Current home glucose readings fasting glucose: 74-135  -Denies hypoglycemic/hyperglycemic symptoms -Patient experiencing false alarms due to low blood sugar on her freestyle libre. Counseled to always verify with traditional glucometer if readings in question. Suspect current sensor may be defective and to replace.  -Recommended to continue current medication  Allergic rhinitis (Goal: Minimize symptoms) -Uncontrolled -Current treatment  Flonase - 2 sprays three times daily: Query Appropriate Montelukast 10 mg daily: Appropriate, Query effective -Medications previously tried: NA -Discussed risks of frequent corticosteroid use.  -Decrease Flonase use to twice daily at most, advised she  could utilize saline nasal spray as needed for congestion in between uses.   Chronic Kidney Disease Stage 4  -All medications assessed for renal dosing and appropriateness in chronic kidney disease. -Managed by Dr. Royce Macadamia, last visit was February 2022.  -Recommended to continue current medication  Patient Goals/Self-Care Activities Patient will:  - check glucose daily, document, and provide at future appointments check blood pressure weekly, document, and provide at future appointments weigh daily, and contact provider if weight gain of greater than 2 pounds in 24 hours   Follow Up Plan: Telephone follow up appointment with care management team member scheduled for:  02/17/2022 at 11:00 AM      Medication Assistance:  Januvia obtained through [employer] medication assistance program.  Enrollment ends Dec 2023   Compliance/Adherence/Medication fill history: Care Gaps: Shingrix Vaccine COVID-19 Vaccine  Star-Rating Drugs: Atorvastatin 20 mg last filled 03/08/2021 23 day supply at [facility]. Lisinopril 20 mg last filled 02/16/2021 45 day supply at [facility].  Patient's preferred pharmacy is:  [facility] - [place], [place] - [address] [place] [address] Phone: [phone] Fax: [phone]  [id number] [place] [place]Phone: [phone]: [phone] Uses pill box? Yes Pt endorses 100% compliance  Patient decided to: Utilize [facility] for medication synchronization, packaging and delivery  Care Plan and Follow Up Patient Decision:  Patient agrees to Care Plan and Follow-up.  Plan: Telephone follow up appointment with care management team member scheduled for:  02/17/2022 at 11:00 AM  AJunius Argyle PharmD, BPara March CPP Clinical Pharmacist Practitioner  [facility] [phone]

## 2021-11-18 NOTE — Telephone Encounter (Signed)
Order faxed to [facility].  Spoke to pt & made her aware.  Nothing further needed.

## 2021-11-22 NOTE — Telephone Encounter (Signed)
Patient in for OV

## 2021-11-22 NOTE — Progress Notes (Unsigned)
Chief Complaint:   OBESITY Stacey Page is here to discuss her progress with her obesity treatment plan along with follow-up of her obesity related diagnoses. [name] is on the Category 2 Plan and states she is following her eating plan approximately 50% of the time. [name] states she is doing leg exercises 5 minutes 3-4  times per week.  Today's visit was #: 79 Starting weight: 344 lbs Starting date: 03/04/2021 Today's weight: 335 lbs Today's date: 11/11/2021 Total lbs lost to date: 9 lbs Total lbs lost since last in-office visit: +2 lbs  Interim History: Oxygen saturation room air with pulmonary 80%.  Oxygen saturation today room air 82%, 85% after resting.  She uses 2 liters nasal cannula.  Per pulmonary goal for saturation 88%-90%.   Subjective:   1. Type 2 diabetes mellitus with stage 4 chronic kidney disease, without long-term current use of insulin (HCC) Per ***, 100% within range.  11/03/2021 PCP stopped Januvia.   2. CKD (chronic kidney disease) stage 4, GFR 15-29 ml/min (HCC) ***  Assessment/Plan:   1. Type 2 diabetes mellitus with stage 4 chronic kidney disease, without long-term current use of insulin (HCC) Continue healthy eating and leg exercises.   2. CKD (chronic kidney disease) stage 4, GFR 15-29 ml/min (HCC) Do not over hydrate.  Follow up with nephrology in November 2023.  3. Obesity, current BMI 51.0 Tessica is currently in the action stage of change. As such, her goal is to continue with weight loss efforts. She has agreed to the Category 2 Plan.   Exercise goals:  As is.   Behavioral modification strategies: increasing lean protein intake, decreasing simple carbohydrates, meal planning and cooking strategies, keeping healthy foods in the home, and planning for success.  [name] has agreed to follow-up with our clinic in 4 weeks. She was informed of the importance of frequent follow-up visits to maximize her success with intensive lifestyle  modifications for her multiple health conditions.   Objective:   Blood pressure (!) 142/70, pulse 77, temperature 97.6 F (36.4 C), height '5\' 8"'$  (1.727 m), weight (!) 335 lb (152 kg), SpO2 (!) 82 %. Body mass index is 50.94 kg/m.  General: Cooperative, alert, well developed, in no acute distress. HEENT: Conjunctivae and lids unremarkable. Cardiovascular: Regular rhythm.  Lungs: Normal work of breathing. Neurologic: No focal deficits.   Lab Results  Component Value Date   CREATININE 1.64 (H) 11/12/2021   BUN 20 11/12/2021   NA 143 11/12/2021   K 3.8 11/12/2021   CL 99 11/12/2021   CO2 30 (H) 11/12/2021   Lab Results  Component Value Date   ALT 7 07/14/2021   AST 10 (L) 07/14/2021   ALKPHOS 65 07/14/2021   BILITOT 0.3 07/14/2021   Lab Results  Component Value Date   HGBA1C 6.3 (H) 11/12/2021   HGBA1C 6.0 (A) 07/14/2021   HGBA1C 6.4 (A) 04/14/2021   HGBA1C 6.1 (H) 10/14/2020   HGBA1C 6.1 05/14/2020   Lab Results  Component Value Date   INSULIN 8.6 10/28/2019   INSULIN 9.2 07/04/2019   INSULIN 8.8 03/25/2019   Lab Results  Component Value Date   TSH 0.51 11/28/2018   Lab Results  Component Value Date   CHOL 116 05/14/2020   HDL 51.60 05/14/2020   LDLCALC 52 05/14/2020   TRIG 61.0 05/14/2020   CHOLHDL 2 05/14/2020   Lab Results  Component Value Date   VD25OH 57.6 10/28/2019   VD25OH 41.5 07/04/2019   VD25OH 38.2 03/25/2019  Lab Results  Component Value Date   WBC 3.1 (L) 10/15/2021   HGB 13.3 10/15/2021   HGB 13.3 10/15/2021   HCT 39.0 10/15/2021   HCT 39.0 10/15/2021   MCV 95.4 10/15/2021   PLT 171 10/15/2021   Lab Results  Component Value Date   IRON 41 10/14/2021   TIBC 279 10/14/2021   FERRITIN 51 10/14/2021    Attestation Statements:   Reviewed by clinician on day of visit: allergies, medications, problem list, medical history, surgical history, family history, social history, and previous encounter notes.  I, Davy Pique, RMA, am  acting as Location manager for Mina Marble, NP.  I have reviewed the above documentation for accuracy and completeness, and I agree with the above. -  ***

## 2021-11-23 ENCOUNTER — Telehealth: Payer: Self-pay

## 2021-11-23 NOTE — Telephone Encounter (Signed)
Rafael from [employer] called to inquire about a Prior Authorization form that needed to be signed and faxed back for this pt. Informed Rafael as soon as Dr. Bebe Shaggy MA returns to the office I will inquire about the document and follow up with him. Cb number provided: [phone].   11/23/21 3:31PM Contacted Rafael to ask for an alternate fax number. Lynda Rainwater, CMA, has attempted 7 times to fax the documents to [employer] using the fax numbers they provided without success. Attempt to fax failed every time. Alternate fax number provided: [phone] and [phone].

## 2021-11-23 NOTE — Telephone Encounter (Signed)
error 

## 2021-11-23 NOTE — Telephone Encounter (Signed)
Signed Prior Authorization forms faxed to the two fax numbers provided.

## 2021-11-24 ENCOUNTER — Telehealth: Payer: Self-pay

## 2021-11-24 ENCOUNTER — Telehealth: Payer: Self-pay | Admitting: Adult Health

## 2021-11-24 DIAGNOSIS — J849 Interstitial pulmonary disease, unspecified: Secondary | ICD-10-CM

## 2021-11-24 DIAGNOSIS — J9611 Chronic respiratory failure with hypoxia: Secondary | ICD-10-CM

## 2021-11-24 NOTE — Telephone Encounter (Signed)
Spoke with pt to confirm the need for the PA requested by [employer] (I am questioning if the telephone encounters with the representative, Barnie Alderman, have been legitimate). Stacey Page states, "well, you know sometimes things are too good to be true. If it is not going through then don't worry about it. I appreciate you calling, thank you." It seems as if Stacey Page was offered this medication or product by [facility], and accepted the offer. She is not sure what this is for.  Per the patient request, I will not be moving forward with this PA and will discontinue any communications with Barnie Alderman and [employer] regarding this patient.

## 2021-11-24 NOTE — Telephone Encounter (Signed)
Spoke with [facility] with [employer]. Informed him 3 out of the 4 numbers he provided were telephone numbers and 1 out of the 4 was a non-working fax number. I asked if there was a secure email address the documents could be sent to, a gmail was provided ([email or web address]). Informed Rafael I would not be able to utilize that email address, being it is not secure. Rafael transferred the call to his supervisor. The supervisor provided me with another fax number, [phone], and requested to stay on the line while I send the fax. Paperwork faxed to this number, fax failed.  Contacting patient to confirm the need of this prior auth.

## 2021-11-24 NOTE — Telephone Encounter (Signed)
Patient called to request her portable oxygen machine.  She stated that she has been waiting for machine because the request was put in for a cpap and she already has a cpap.  Patient would like a call back once this order has been put in.  CB# [phone]

## 2021-11-24 NOTE — Telephone Encounter (Signed)
Order has been placed for pt to receive a POC from [facility]. Attempted to call pt to let her know this had been done but unable to reach. When trying to leave a VM, received a recording in the middle of me trying to record a message stating, "Nothing has been recorded, record message after the tone." Tried to re-record my message again and received the same recording so hung up. Will try to call pt back later to let her know the order had been placed.

## 2021-11-25 ENCOUNTER — Telehealth: Payer: Self-pay

## 2021-11-25 ENCOUNTER — Telehealth: Payer: Self-pay | Admitting: Adult Health

## 2021-11-25 NOTE — Telephone Encounter (Signed)
Called and spoke with patient. Patient verbalized understanding. Nothing further needed.  

## 2021-11-25 NOTE — Telephone Encounter (Signed)
Referral and notes have been faxed to [facility]

## 2021-11-25 NOTE — Telephone Encounter (Signed)
Called and spoke to Stacey Page from [facility] and he explained for the patient to not have to come out of pocket for oxygen then orders for current oxygen need to be sent to [facility]. He states this will get processed by them and that the patient will not have to pay anything for the oxygen  Fax number for [facility] is [phone]  Confirmed fax number twice.  Please advise ladies  Thank you

## 2021-11-25 NOTE — Progress Notes (Signed)
    Chronic Care Management Pharmacy Assistant   Name: Courteney Alderete  MRN: [id number] DOB: Jan 23, 1951  Reason for Encounter: Medication Review/ Patient assistance application for Jardiance.   Recent office visits:  None ID  Recent consult visits:  None ID  Hospital visits:  None in previous 6 months  Medications: Outpatient Encounter Medications as of 11/25/2021  Medication Sig   albuterol (VENTOLIN HFA) 108 (90 Base) MCG/ACT inhaler INHALE 1-2 PUFFS BY MOUTH into THE lungs every SIX hours AS NEEDED FOR WHEEZING AND/OR SHORTNESS OF BREATH   aspirin 81 MG tablet Take 1 tablet (81 mg total) by mouth daily with breakfast.   Blood Glucose Monitoring Suppl ([place]) w/Device KIT 1 kit by Does not apply route daily as needed.   carvedilol (COREG) 25 MG tablet Take 1 tablet (25 mg total) by mouth 2 (two) times daily.   cholecalciferol (VITAMIN D3) 25 MCG (1000 UNIT) tablet Take 2,000 Units by mouth daily.   Continuous Blood Gluc Sensor (FREESTYLE LIBRE 14 DAY SENSOR) MISC Place one sensor on the skin every 14 days to monitor blood sugars continuously as directed.   Docusate Sodium 100 MG capsule Take 100 mg by mouth 2 (two) times daily.   empagliflozin (JARDIANCE) 10 MG TABS tablet Take 1 tablet (10 mg total) by mouth daily before breakfast.   ferrous sulfate 325 (65 FE) MG tablet Take 325 mg by mouth 2 (two) times daily with a meal.   fluticasone (FLONASE) 50 MCG/ACT nasal spray instill ONE SPRAY in each nostril daily (Patient taking differently: Place 1 spray into both nostrils daily.)   furosemide (LASIX) 80 MG tablet Take 1 tablet (80 mg total) by mouth daily.   gabapentin (NEURONTIN) 100 MG capsule Take 1 capsule (100 mg total) by mouth 2 (two) times daily.   glucose blood (ONETOUCH VERIO) test strip Use to monitor blood sugar twice daily as instructed   hydrALAZINE (APRESOLINE) 50 MG tablet Take 1 tablet (50 mg total) by mouth every 8 (eight) hours.   isosorbide  dinitrate (ISORDIL) 10 MG tablet Take 1 tablet (10 mg total) by mouth 3 (three) times daily.   montelukast (SINGULAIR) 10 MG tablet Take 1 tablet (10 mg total) by mouth at bedtime.   OneTouch Delica Lancets 61W MISC Use to monitor blood sugars once daily as directed   pantoprazole (PROTONIX) 40 MG tablet Take 1 tablet (40 mg total) by mouth daily.   No facility-administered encounter medications on file as of 11/25/2021.    Care Gaps: None ID   Star Rating Drugs: Jardiance 10 mg last filled 11/15/2021 [facility] day supply [facility].  Medication Fill Gaps: None ID  I received a task from Junius Argyle, CPP requesting that I start the application for patient assistance on the medication Jardiance.    Patient application will be mailed. Once she receives the application she will need to complete her part of the application and return it to her PCP office for Junius Argyle, CPP to fax over to the [facility] for processing.Patient should include a copy of her proof of income AND a copy of her Explanation of Benefits (EOB) statement from her insurance. The application will need to be faxed to [phone] and the phone number that can be called to check the status of the application will be [phone].   Application emailed to Junius Argyle, CPP for review and to mail to patient's home.  [place] Pharmacist Assistant [phone]

## 2021-11-25 NOTE — Telephone Encounter (Signed)
[facility]C patient. Tried to leave a VM but in the middle of me leaving one, it said nothing was being recorded. Will try again.

## 2021-11-25 NOTE — Telephone Encounter (Signed)
Rafael from [facility] called and would like you to give him a call [phone]

## 2021-11-26 DIAGNOSIS — J9611 Chronic respiratory failure with hypoxia: Secondary | ICD-10-CM | POA: Diagnosis not present

## 2021-11-26 NOTE — Telephone Encounter (Signed)
.   Type Date User Summary Attachment  Provider Comments 11/17/2021  1:44 PM Monna Fam L, RN Provider Comments -  Note:   DME: [facility]   Patient is needing a home concentrator. Patient is currently on 2L of oxygen and 3L pulsed while ambulating. Patient is also on cpap therapy as well. Please be aware. Thank you  Home concentrator was ordered on 10/11. Can we send this order to [facility]?

## 2021-11-26 NOTE — Telephone Encounter (Signed)
I called [facility] to confirm they got the 02 order and they stated what they received was POC order and they can't give the patient POC without providing them with home concentrator.  They now need order for stationary 02

## 2021-11-29 ENCOUNTER — Telehealth: Payer: Self-pay

## 2021-11-29 NOTE — Progress Notes (Signed)
Patient reports she needs a refill for isosorbide dinitrate 10 mg to be deliver on 12/31/2021.  Acute form completed for isosorbide dinitrate 10 mg to be deliver on 12/31/2021, and [facility].  Patient is asking if there glucose tablets over the counter medications or is this a prescriptions. Notified clinical pharmacist.   Per Clinical pharmacist, She can get it over the counter.   [place] Pharmacist Assistant [phone]

## 2021-11-30 NOTE — Telephone Encounter (Signed)
This has been taken care of. When I called [facility] they stated that the 02 order was received and sent to [place] and was delivered on 11/26/21

## 2021-12-02 ENCOUNTER — Encounter: Payer: Self-pay | Admitting: Family Medicine

## 2021-12-02 ENCOUNTER — Ambulatory Visit (INDEPENDENT_AMBULATORY_CARE_PROVIDER_SITE_OTHER): Payer: No Typology Code available for payment source | Admitting: Family Medicine

## 2021-12-02 ENCOUNTER — Ambulatory Visit: Payer: No Typology Code available for payment source

## 2021-12-02 VITALS — BP 122/66 | HR 67 | Temp 97.6°F | Ht 68.0 in | Wt 337.0 lb

## 2021-12-02 DIAGNOSIS — Z23 Encounter for immunization: Secondary | ICD-10-CM

## 2021-12-02 DIAGNOSIS — J309 Allergic rhinitis, unspecified: Secondary | ICD-10-CM

## 2021-12-02 DIAGNOSIS — H6992 Unspecified Eustachian tube disorder, left ear: Secondary | ICD-10-CM | POA: Diagnosis not present

## 2021-12-02 MED ORDER — PREDNISONE 10 MG PO TABS: 10.0000 mg | ORAL_TABLET | Freq: Two times a day (BID) | ORAL | 0 refills | Status: AC

## 2021-12-02 NOTE — Progress Notes (Signed)
Established Patient Office Visit  Subjective   Patient ID: Stacey Page, female    DOB: 12-13-1950  Age: 71 y.o. MRN: [id number]  Chief Complaint  Patient presents with   Follow-up    4 week follow up, states that left ear bothering patient.     HPI for evaluation of left ear congestion and discomfort associated with sneezing and postnasal drip.  Continues with regular use of Flonase.    Review of Systems  Constitutional: Negative.   HENT:  Positive for congestion and hearing loss.   Eyes:  Negative for blurred vision, discharge and redness.  Respiratory: Negative.    Cardiovascular: Negative.   Gastrointestinal:  Negative for abdominal pain.  Genitourinary: Negative.   Musculoskeletal: Negative.  Negative for myalgias.  Skin:  Negative for rash.  Neurological:  Negative for tingling, loss of consciousness and weakness.  Endo/Heme/Allergies:  Negative for polydipsia.      Objective:     BP 122/66 (BP Location: Right Arm, Patient Position: Sitting, Cuff Size: Large)   Pulse 67   Temp 97.6 F (36.4 C) (Temporal)   Ht '5\' 8"'$  (1.727 m)   Wt (!) 337 lb (152.9 kg)   SpO2 98%   BMI 51.24 kg/m    Physical Exam Constitutional:      General: She is not in acute distress.    Appearance: Normal appearance. She is not ill-appearing, toxic-appearing or diaphoretic.  HENT:     Head: Normocephalic and atraumatic.     Right Ear: External ear normal. No middle ear effusion. There is no impacted cerumen. Tympanic membrane is retracted. Tympanic membrane is not erythematous.     Left Ear: External ear normal.  No middle ear effusion. There is no impacted cerumen. Tympanic membrane is retracted. Tympanic membrane is not erythematous.     Mouth/Throat:     Mouth: Mucous membranes are moist.     Pharynx: Oropharynx is clear. No oropharyngeal exudate or posterior oropharyngeal erythema.  Eyes:     General: No scleral icterus.       Right eye: No discharge.        Left eye: No  discharge.     Extraocular Movements: Extraocular movements intact.     Conjunctiva/sclera: Conjunctivae normal.     Pupils: Pupils are equal, round, and reactive to light.  Cardiovascular:     Rate and Rhythm: Normal rate and regular rhythm.  Pulmonary:     Effort: Pulmonary effort is normal. No respiratory distress.     Breath sounds: Normal breath sounds.  Musculoskeletal:     Cervical back: No rigidity or tenderness.  Skin:    General: Skin is warm and dry.  Neurological:     Mental Status: She is alert and oriented to person, place, and time.  Psychiatric:        Mood and Affect: Mood normal.        Behavior: Behavior normal.      No results found for any visits on 12/02/21.    The ASCVD Risk score (Arnett DK, et al., 2019) failed to calculate for the following reasons:   The valid total cholesterol range is 130 to 320 mg/dL    Assessment & Plan:   Problem List Items Addressed This Visit       Respiratory   Allergic rhinitis     Nervous and Auditory   Dysfunction of left eustachian tube   Relevant Medications   predniSONE (DELTASONE) 10 MG tablet     Other  Need for shingles vaccine - Primary   Relevant Orders   Varicella-zoster vaccine IM (Completed)   Need for influenza vaccination   Relevant Orders   Flu vaccine HIGH DOSE PF (Fluzone High dose) (Completed)    Return if symptoms worsen or fail to improve.  Continue Flonase.  Brief course of prednisone 10 mg twice daily for 5 days.  Eustachian tube exercises demonstrated.  Asked patient to perform them 3 times daily.  Information was given on eustachian tube dysfunction along days Everone thank you I think he does okay thanks.  Follow-up if not improving.  [name], MD

## 2021-12-07 DIAGNOSIS — E1122 Type 2 diabetes mellitus with diabetic chronic kidney disease: Secondary | ICD-10-CM | POA: Diagnosis not present

## 2021-12-07 DIAGNOSIS — I5030 Unspecified diastolic (congestive) heart failure: Secondary | ICD-10-CM | POA: Diagnosis not present

## 2021-12-07 DIAGNOSIS — I130 Hypertensive heart and chronic kidney disease with heart failure and stage 1 through stage 4 chronic kidney disease, or unspecified chronic kidney disease: Secondary | ICD-10-CM

## 2021-12-07 DIAGNOSIS — E785 Hyperlipidemia, unspecified: Secondary | ICD-10-CM | POA: Diagnosis not present

## 2021-12-07 DIAGNOSIS — N184 Chronic kidney disease, stage 4 (severe): Secondary | ICD-10-CM

## 2021-12-08 DIAGNOSIS — E1122 Type 2 diabetes mellitus with diabetic chronic kidney disease: Secondary | ICD-10-CM | POA: Diagnosis not present

## 2021-12-08 DIAGNOSIS — Z9981 Dependence on supplemental oxygen: Secondary | ICD-10-CM

## 2021-12-08 DIAGNOSIS — I2489 Other forms of acute ischemic heart disease: Secondary | ICD-10-CM | POA: Diagnosis not present

## 2021-12-08 DIAGNOSIS — Z7982 Long term (current) use of aspirin: Secondary | ICD-10-CM

## 2021-12-08 DIAGNOSIS — N184 Chronic kidney disease, stage 4 (severe): Secondary | ICD-10-CM | POA: Diagnosis not present

## 2021-12-08 DIAGNOSIS — J9602 Acute respiratory failure with hypercapnia: Secondary | ICD-10-CM | POA: Diagnosis not present

## 2021-12-08 DIAGNOSIS — Z6841 Body Mass Index (BMI) 40.0 and over, adult: Secondary | ICD-10-CM | POA: Diagnosis not present

## 2021-12-08 DIAGNOSIS — Z7951 Long term (current) use of inhaled steroids: Secondary | ICD-10-CM

## 2021-12-08 DIAGNOSIS — G4733 Obstructive sleep apnea (adult) (pediatric): Secondary | ICD-10-CM | POA: Diagnosis not present

## 2021-12-08 DIAGNOSIS — Z96651 Presence of right artificial knee joint: Secondary | ICD-10-CM

## 2021-12-08 DIAGNOSIS — Z7984 Long term (current) use of oral hypoglycemic drugs: Secondary | ICD-10-CM

## 2021-12-08 DIAGNOSIS — E1151 Type 2 diabetes mellitus with diabetic peripheral angiopathy without gangrene: Secondary | ICD-10-CM | POA: Diagnosis not present

## 2021-12-08 DIAGNOSIS — J849 Interstitial pulmonary disease, unspecified: Secondary | ICD-10-CM | POA: Diagnosis not present

## 2021-12-08 DIAGNOSIS — I130 Hypertensive heart and chronic kidney disease with heart failure and stage 1 through stage 4 chronic kidney disease, or unspecified chronic kidney disease: Secondary | ICD-10-CM | POA: Diagnosis not present

## 2021-12-08 DIAGNOSIS — J449 Chronic obstructive pulmonary disease, unspecified: Secondary | ICD-10-CM

## 2021-12-08 DIAGNOSIS — E782 Mixed hyperlipidemia: Secondary | ICD-10-CM | POA: Diagnosis not present

## 2021-12-08 DIAGNOSIS — I5033 Acute on chronic diastolic (congestive) heart failure: Secondary | ICD-10-CM | POA: Diagnosis not present

## 2021-12-13 ENCOUNTER — Encounter: Payer: Self-pay | Admitting: Cardiology

## 2021-12-13 ENCOUNTER — Ambulatory Visit: Payer: No Typology Code available for payment source | Attending: Cardiology | Admitting: Cardiology

## 2021-12-13 ENCOUNTER — Telehealth: Payer: Self-pay

## 2021-12-13 VITALS — BP 138/86 | HR 64 | Ht 68.0 in | Wt 339.2 lb

## 2021-12-13 DIAGNOSIS — E785 Hyperlipidemia, unspecified: Secondary | ICD-10-CM | POA: Diagnosis not present

## 2021-12-13 DIAGNOSIS — Z6841 Body Mass Index (BMI) 40.0 and over, adult: Secondary | ICD-10-CM

## 2021-12-13 DIAGNOSIS — G4733 Obstructive sleep apnea (adult) (pediatric): Secondary | ICD-10-CM | POA: Diagnosis not present

## 2021-12-13 DIAGNOSIS — I272 Pulmonary hypertension, unspecified: Secondary | ICD-10-CM | POA: Diagnosis not present

## 2021-12-13 DIAGNOSIS — N184 Chronic kidney disease, stage 4 (severe): Secondary | ICD-10-CM | POA: Diagnosis not present

## 2021-12-13 DIAGNOSIS — I10 Essential (primary) hypertension: Secondary | ICD-10-CM

## 2021-12-13 DIAGNOSIS — J849 Interstitial pulmonary disease, unspecified: Secondary | ICD-10-CM | POA: Diagnosis not present

## 2021-12-13 DIAGNOSIS — I5032 Chronic diastolic (congestive) heart failure: Secondary | ICD-10-CM | POA: Diagnosis not present

## 2021-12-13 DIAGNOSIS — E1169 Type 2 diabetes mellitus with other specified complication: Secondary | ICD-10-CM | POA: Diagnosis not present

## 2021-12-13 DIAGNOSIS — I350 Nonrheumatic aortic (valve) stenosis: Secondary | ICD-10-CM

## 2021-12-13 DIAGNOSIS — I110 Hypertensive heart disease with heart failure: Secondary | ICD-10-CM | POA: Diagnosis not present

## 2021-12-13 MED ORDER — PANTOPRAZOLE SODIUM 40 MG PO TBEC: 40.0000 mg | DELAYED_RELEASE_TABLET | Freq: Every day | ORAL | 1 refills | Status: DC

## 2021-12-13 NOTE — Progress Notes (Signed)
Primary Care Provider: [name], [place] Cardiologist: Glenetta Hew, MD Nephrologist: Dr. Royce Macadamia Electrophysiologist: None  Clinic Note: Chief Complaint  Patient presents with   Follow-up    6-week follow-up.  Doing well.   Congestive Heart Failure    Chronic HFpEF.  Doing well.   Aortic Stenosis    Moderate AS by recent echo.    ===================================  ASSESSMENT/PLAN   Problem List Items Addressed This Visit       Cardiology Problems   Hypertensive heart disease with chronic diastolic congestive heart failure ([name]) - Primary (Chronic)    Right heart cath really did not show significant diastolic heart failure with normal LVEDP and PCWP.   CPX actually indicated restrictive lung physiology with the more prominent reason for dyspnea being obesity with OHS. Continue blood pressure control prior to the reduction and rate control.  On carvedilol 25 mg twice daily (with heart rate 64 bpm, probably would not titrate further). Thankfully, she was started back on SGLT2 inhibitor-on CHF dose of Jardiance 10 mg daily On standing dose of furosemide 80 mg daily and not requiring additional doses.  Discussed importance of foot elevation. Continue CPAP with oxygen. He is on the combination hydralazine/Isordil 50 & 10 mg 3 times daily => ARB was stopped during hospital stay because of concern for renal function.   I have asked that she discuss with Dr. Royce Macadamia.  I think it would be probably beneficial for her to be on an ARB with a creatinine of 1.6. => Would like to replace hydralazine and Isordil with a stable ARB dose.      Relevant Orders   EKG 12-Lead (Completed)   Hyperlipidemia associated with type 2 diabetes mellitus ([place]) (Chronic)    Have not seen labs checked in a long time.  Her last labs were from April 2022 and LDL looks great.  Labs had been followed by PCP.  Should be due for follow-up check along with A1c etc.  Back on  Jardiance appropriately based on renal insufficiency.  Not on other medications.  Last A1c was 6.3.  If elevated above 6.5 in follow-up, would consider GLP-1 agonist.      Moderate aortic stenosis by prior echocardiogram (Chronic)    Very low level moderate AAS on echo.  Plan will be recheck in 2 years (summer of 2025).  Does not need be checked annually.      Relevant Orders   EKG 12-Lead (Completed)   Essential hypertension (Chronic)    Pressure also seems relatively stable on current meds.  As noted in CHF section, would like to try to convert from hydralazine-Isordil back to ARB.  Have asked that she discuss with her nephrologist.  If in agreement, would asked that they please go and make that change.      Mild pulmonary hypertension (HCC) (Chronic)    Right heart cath after diuresis revealed PA P of 2800 mercury.  PCWP 13 mmHg.  Indicating WHO Class III baseline pulm hypertension that can be exacerbated by diastolic HFpEF.  Likely related to ILD, obesity, OHS/OSA. Marland Kitchen  Defer management of ILD to pulmonary medicine.   CPAP with oxygen, weight loss after reduction.        Other   Class 3 severe obesity with serious comorbidity and body mass index (BMI) of 50.0 to 59.9 in adult [facility]) (Chronic)    Please lose weight.  We talked about dietary modification.  Her A1c was 6.3 so she is not quite  in the window where we could consider Mounjaro or Ozempic, however if the A1c does go above 6.5, then I would recommend initiating GLP-1 agonist.  She understands she needs to lose weight.  Stressed importance of active exercise and dietary adjustment.      ILD (interstitial lung disease) 2/2 MAI s/p med rxn (Chronic)    Followed by pulmonary medicine.  Contributing to dyspnea and WHO Class III Pulmonary Hypertension      OSA (obstructive sleep apnea) (Chronic)    OSA with likely OHS.  Continue CPAP at nighttime oxygen.  Combination of ILD and OSA/obesity contributing to dyspnea seen on CPX  and pulmonary pretension on 2D right heart cath.      Relevant Orders   EKG 12-Lead (Completed)   ===================================  HPI:    Stacey Page is a morbidly obese 71 y.o. female with a PMH notable for Moderate AS, Hypertensive Heart Disease With Chronic HFpEF, DM-2, Reactive Airway Disease, OSA on CPAP-OHS, ILD, HLD, CKD 3/4 who presents today for 6-week follow-up.   She has Chronic Dyspnea with combination of COPD/OHS-OSA, ILD and HFpEF.  Hypertension is very difficult to manage medications. . Recent Hospitalizations:  10/10/2021 respiratory failure: Diuresed and underwent RHC showing mild PAH. Mean PAP 28 mmHg (WHO group 3).  Thought to be related to medication nonadherence.  Transition to p.o. Lasix 80 mg daily.  Spironolactone and Wilder Glade stopped because of  CKD 4.   Cheyenne Bordeaux was last seen on 11/02/2021 by Fabian Sharp, PA 2 daughters.  She is wearing oxygen when she felt that she needed.  No issues with breathing or lower extremity swelling.  Noted headache began 2 months prior although she did not Nexletol for 6 months.  Has had some issues with hypoglycemia that may have been causing headaches.  Recommended that she hold Januvia until she spoke to her PCP.  Recommended wearing O2 24/7 and hospitalization also recommend not missing diuretic dose. GDMT felt to be limited by kidney dysfunction. Continue on hydralazine 50 mg 3 times daily, Isordil 10 mg 3 times daily, Coreg 25 mg twice daily with controlled blood pressure.  Consider restarting SGLT2 inhibitor. Plan echo follow-up in 1 year.  To reassess aortic stenosis. Continue Lipitor.   Reviewed  CV studies:    The following studies were reviewed today: (if available, images/films reviewed: From Epic Chart or Care Everywhere) Echo 10/10/2021: EF 70 to 75%.  Hyperdynamic with no RWMA.  Moderate LVH.  Moderate LA dilation indicating likely diastolic dysfunction although not interpretable.  Moderately elevated PAP  with normal RV function, dilated IVC with elevated RAP estimated at 15 mmHg..  Moderate MAC with no MR.  AOV calcification with Moderate Aortic Stenosis-mean AvG 23 mmHg.  [place] 10/15/21: Mild pulmonary hypertension with mean PA pressure 28 mmHg, PCWP mean 13 mmHg, and pulmonary vascular resistance 2.73 Wood units.  Pulmonary hypertension etiology suspected to be WHO Group 3 (related to lung disease/hypoxia).  RAP 6 mmHg, CO-CI 5.5L/min, 2.17 L/min/m.  CPX 06/2021: Resting spirometry showed RESTRICTIVE PHYSIOLOGY.  Functional capacity seem to be normal but unadjusted peak VO2 was closed suggesting that body habitus is the main limiting factor for exercise limitation.  No chronotropic incompetence.  Interval History:   Yanitza Shvartsman returns today overall stating that she is feeling much better.  She is doing well.  Her blood pressures have been better controlled.  This morning her blood pressure was 134/70.  She says her home dry weight has been somewhere between 333 and 334  pounds.  Her weight this morning was 333 pounds.  This is pretty much stable as was her pressure when she saw Angie Duke back in September. Was having some issues with low blood sugars.  They recommended holding Januvia, but the plan would be to go back on SGLT2 inhibitor.  She is now back on Jardiance 10 mg.  The only downfall since her last visit was that she tripped and fell and injured her knee.  She has been a little limited walking because of that pain but otherwise doing fine.  CV Review of Symptoms (Summary): positive for - dyspnea on exertion, edema, palpitations, shortness of breath, and these are all relatively stable and not bothering her that much.  Exertional dyspnea is actually better now that she is lost weight.  Sleeping better using nighttime oxygen with CPAP -> swelling is doing better now that she is back on SGLT2 inhibitor-on low-dose Jardiance.. negative for - chest pain, orthopnea, paroxysmal nocturnal dyspnea,  rapid heart rate, or lightheadedness, dizziness or wooziness, syncope/syncope or TIA/amaurosis fugax, claudication.  No real orthopnea or PND because she uses CPAP at night with oxygen.  REVIEWED OF SYSTEMS   Review of Systems  Constitutional:  Negative for malaise/fatigue (Energy level better) and weight loss (Has maintained stable weight).  HENT:  Negative for congestion.   Respiratory:  Positive for shortness of breath (Per HPI.  Stable.).   Cardiovascular:        Per HPI  Gastrointestinal:  Negative for abdominal pain, blood in stool and melena.  Genitourinary:  Negative for hematuria.  Musculoskeletal:  Positive for falls (She tripped and fell, landed on her knee) and joint pain. Negative for myalgias.  Neurological:  Negative for dizziness and focal weakness.  Psychiatric/Behavioral:  Negative for depression and memory loss. The patient is not nervous/anxious and does not have insomnia.     I have reviewed and (if needed) personally updated the patient's problem list, medications, allergies, past medical and surgical history, social and family history.   PAST MEDICAL HISTORY   Past Medical History:  Diagnosis Date   Anxiety    doesn't take any meds   Arthritis of right knee 03/14/2016   Asthma    Back pain    Chronic diastolic CHF (congestive heart failure) (HCC)    HF with Preserved EF (60-65%) - Grade II Diastolic Dysfunction (Hypertensive Heart Disease). takes Furosemide daily   Depression    doesn't take meds   Diabetes ([place])    takes Januvia daily   Eczema    uses cream as needed   GERD (gastroesophageal reflux disease)    History of bronchitis as a child    HTN (hypertension)    Insomnia    Moderate aortic stenosis by prior echocardiogram 04/2017   10/15/2021: Horald Pollen calcification with moderate AS, MG 23 mmHg   Mycobacterium avium-intracellulare infection ([place]) 2016   OA (osteoarthritis)    Obese    OSA (obstructive sleep apnea) 02/25/2014   wears CPAP at  night   Peripheral neuropathy    takes Gabapentin as needed   Pneumonia    hx of-2010   Seasonal allergies    uses Flonase daily   Sleep apnea     PAST SURGICAL HISTORY   Past Surgical History:  Procedure Laterality Date   BREAST BIOPSY Right 03/2018   BREAST LUMPECTOMY WITH RADIOACTIVE SEED LOCALIZATION Right 12/28/2018   Procedure: RIGHT BREAST LUMPECTOMY WITH RADIOACTIVE SEED LOCALIZATION;  Surgeon: Jovita Kussmaul, MD;  Location: [facility];  Service: General;  Laterality: Right;   BREAST SURGERY     CARDIOPULMONARY EXERCISE TEST (CPX)  06/21/2021   (Performed on Coreg 25 mg twice daily): PFTs: FVC 1.76 (60%), FEV1 1.59 (77%), ratio 114% => RESTRICTIVE PHYSIOLOGY; 5 min- 1 mph - 2% grade. 7/10 dyspnea -> pulse ox range during exercise 95 to 98%, low of 90%, 97% on recovery; BP 106/84-160/64; heart rate 64-90 bpm (60% MPHR) -> Chronotropic Incompetence;;MAIN FACTOR for Exercise Limitation = BODY HABITUS w/ Chronotropic Incompetence.   CESAREAN SECTION  x2   COLONOSCOPY     CORONARY CALCIUM SCORE & CT ANGIOGRAM  09/2017   Coronary Ca Score = 11 (low).  CTA- no obstructive CAD (minimal disease)   JOINT REPLACEMENT     KNEE ARTHROSCOPY Right    LUNG BIOPSY Right 06/10/2014   Procedure: LUNG BIOPSY;  Surgeon: Ivin Poot, MD;  Location: [facility];  Service: Thoracic;  Laterality: Right;   POLYPECTOMY     throat   RIGHT HEART CATH N/A 10/15/2021   Procedure: RIGHT HEART CATH;  Surgeon: Belva Crome, MD;  Location: [place] [facility];  Service: CV: Mild Pulmonary Hypertension (WHO Group 3).  Mean PAP 28 mmHg, PCWP 13 mmHg.  PVR 2.73 [place].  RAP 6 mmHg.CO-CI 5.5L/min, 2.17 L/min/m.   TOTAL KNEE ARTHROPLASTY Right 03/14/2016   Procedure: RIGHT TOTAL KNEE ARTHROPLASTY;  Surgeon: Marybelle Killings, MD;  Location: [facility];  Service: Orthopedics;  Laterality: Right;   TRANSTHORACIC ECHOCARDIOGRAM  10/10/2021   EF 70 to 75%.  Hyperdynamic with no RWMA.  Moderate LVH.  Moderate LA dilation indicating  likely diastolic dysfunction although not interpretable.  Moderately elevated PAP with normal RV function, dilated IVC with elevated RAP estimated at 15 mmHg..  Moderate MAC with no MR.  AOV calcification with Moderate AS-mean AvG 23 mmHg.   TRANSTHORACIC ECHOCARDIOGRAM  06/14/2021   EF 60 to 65%.  No RWMA.  Moderate cLVH with GR 1 DD.  Mild LA dilation.  Mild to moderate aortic calcification/stenosis.  Mean AVG 18 mmHg.   VIDEO ASSISTED THORACOSCOPY Right 06/10/2014   Procedure: VIDEO ASSISTED THORACOSCOPY;  Surgeon: Ivin Poot, MD;  Location: [facility];  Service: Thoracic;  Laterality: Right;    Immunization History  Administered Date(s) Administered   Fluad Quad(high Dose 65+) 11/28/2018, 01/29/2020   Influenza Split 11/21/2016   Influenza, High Dose Seasonal PF 12/02/2021   Influenza,inj,Quad PF,6+ Mos 01/12/2015, 11/19/2015   PFIZER(Purple Top)SARS-COV-2 Vaccination 03/31/2019, 04/23/2019, 03/21/2020   Pneumococcal Conjugate-13 01/19/2017   Pneumococcal Polysaccharide-23 01/12/2015, 05/14/2020   Zoster Recombinat (Shingrix) 07/14/2021, 12/02/2021    MEDICATIONS/ALLERGIES   Current Meds  Medication Sig   albuterol (VENTOLIN HFA) 108 (90 Base) MCG/ACT inhaler INHALE 1-2 PUFFS BY MOUTH into THE lungs every SIX hours AS NEEDED FOR WHEEZING AND/OR SHORTNESS OF BREATH   aspirin 81 MG tablet Take 1 tablet (81 mg total) by mouth daily with breakfast.   Blood Glucose Monitoring Suppl ([place]) w/Device KIT 1 kit by Does not apply route daily as needed.   carvedilol (COREG) 25 MG tablet Take 1 tablet (25 mg total) by mouth 2 (two) times daily.   cholecalciferol (VITAMIN D3) 25 MCG (1000 UNIT) tablet Take 2,000 Units by mouth daily.   Continuous Blood Gluc Sensor (FREESTYLE LIBRE 14 DAY SENSOR) MISC Place one sensor on the skin every 14 days to monitor blood sugars continuously as directed.   Docusate Sodium 100 MG capsule Take 100 mg by mouth 2 (two)  times daily.    empagliflozin (JARDIANCE) 10 MG TABS tablet Take 1 tablet (10 mg total) by mouth daily before breakfast.   ferrous sulfate 325 (65 FE) MG tablet Take 325 mg by mouth 2 (two) times daily with a meal.   fluticasone (FLONASE) 50 MCG/ACT nasal spray instill ONE SPRAY in each nostril daily (Patient taking differently: Place 1 spray into both nostrils daily.)   furosemide (LASIX) 80 MG tablet Take 1 tablet (80 mg total) by mouth daily.   gabapentin (NEURONTIN) 100 MG capsule Take 1 capsule (100 mg total) by mouth 2 (two) times daily.   glucose blood (ONETOUCH VERIO) test strip Use to monitor blood sugar twice daily as instructed   hydrALAZINE (APRESOLINE) 50 MG tablet Take 1 tablet (50 mg total) by mouth every 8 (eight) hours.   isosorbide dinitrate (ISORDIL) 10 MG tablet Take 1 tablet (10 mg total) by mouth 3 (three) times daily.   montelukast (SINGULAIR) 10 MG tablet Take 1 tablet (10 mg total) by mouth at bedtime.   OneTouch Delica Lancets 70V MISC Use to monitor blood sugars once daily as directed   pantoprazole (PROTONIX) 40 MG tablet Take 1 tablet (40 mg total) by mouth daily.    No Known Allergies  SOCIAL HISTORY/FAMILY HISTORY   Reviewed in Epic:  Pertinent findings:  Social History   Tobacco Use   Smoking status: Former    Packs/day: 0.25    Years: 15.00    Total pack years: 3.75    Types: Cigarettes    Quit date: 02/08/1980    Years since quitting: 41.9   Smokeless tobacco: Former  Scientific laboratory technician Use: Never used  Substance Use Topics   Alcohol use: Yes    Alcohol/week: 0.0 standard drinks of alcohol    Comment: occassional/social/rare   Drug use: Not Currently   Social History   Social History Narrative   Not on file    OBJCTIVE -PE, EKG, labs   Wt Readings from Last 3 Encounters:  12/24/21 (!) 338 lb (153.3 kg)  12/23/21 (!) 339 lb (153.8 kg)  12/13/21 (!) 339 lb 3.2 oz (153.9 kg)    Physical Exam: BP 138/86 (BP Location: Left Arm, Patient Position:  Sitting)   Pulse 64   Ht _0  (1.727 m)   Wt (!) 339 lb 3.2 oz (153.9 kg)   SpO2 91%   BMI 51.58 kg/m  Physical Exam Vitals reviewed.  Constitutional:      General: She is not in acute distress.    Appearance: Normal appearance. She is obese. She is not ill-appearing (Well-groomed.) or toxic-appearing.  HENT:     Head: Normocephalic and atraumatic.  Neck:     Vascular: No carotid bruit (Radiated aortic murmur.) or JVD (7-8 cm water.).  Cardiovascular:     Rate and Rhythm: Normal rate and regular rhythm. Occasional Extrasystoles are present.    Chest Wall: PMI is not displaced.     Pulses: Decreased pulses (Diminished, palpable pedal pulses.  Difficult to palpate because of body habitus).     Heart sounds: S1 normal and S2 normal. Heart sounds are distant. Murmur heard.     High-pitched harsh crescendo-decrescendo midsystolic murmur is present with a grade of 2/6 at the upper right sternal border radiating to the neck.     No friction rub. Gallop present. S4 sounds present.  Pulmonary:     Effort: Pulmonary effort is normal. No respiratory distress.     Breath sounds: Normal breath sounds.  No wheezing, rhonchi or rales.  Musculoskeletal:        General: Swelling (Trivial) present. Normal range of motion.     Cervical back: Normal range of motion and neck supple.  Skin:    General: Skin is warm and dry.  Neurological:     General: No focal deficit present.     Mental Status: She is alert and oriented to person, place, and time.     Cranial Nerves: No cranial nerve deficit.     Motor: No weakness.     Gait: Gait abnormal.  Psychiatric:        Mood and Affect: Mood normal.        Behavior: Behavior normal.        Thought Content: Thought content normal.        Judgment: Judgment normal.     Adult ECG Report  Rate: 64;  Rhythm: normal sinus rhythm, sinus arrhythmia, and T wave inversions, cannot exclude inferior ischemia. ;   Narrative Interpretation: Stable  Recent Labs:  Reviewed Lab Results  Component 05/14/2020   CHOL 116   HDL 51.60   LDLCALC 52   TRIG 61.0   CHOLHDL 2   Lab Results  Component 11/12/2021   CREATININE 1.64 (H)   BUN 20   NA 143   K 3.8   CL 99   CO2 30 (H)      Latest Ref Rng & Units 10/15/2021    1:04 PM 10/15/2021    7:36 AM 10/14/2021    9:16 AM  CBC  WBC 4.0 - 10.5 K/uL  3.1  3.6   Hemoglobin 12.0 - 15.0 g/dL 12.0 - 15.0 g/dL 13.3    13.3  12.5  12.7   Hematocrit 36.0 - 46.0 % 36.0 - 46.0 % 39.0    39.0  41.3  41.8   Platelets 150 - 400 K/uL  171  172     Lab Results  Component 11/12/2021   HGBA1C 6.3 (H)   Lab Results  Component 11/28/2018   TSH 0.51    ================================================== I spent a total of 30 minutes with the patient spent in direct patient consultation.  Additional time spent with chart review  / charting (studies, outside notes, etc): 15 min Total Time: 50 min  Current medicines are reviewed at length with the patient today.  (+/- concerns) N/A  Notice: This dictation was prepared with Dragon dictation along with smart phrase technology. Any transcriptional errors that result from this process are unintentional and may not be corrected upon review.  Studies Ordered:   Orders Placed This Encounter  Procedures   EKG 12-Lead   No orders of the defined types were placed in this encounter.   Patient Instructions / Medication Changes & Studies & Tests Ordered   Patient Instructions  Medication Instructions:    No changes at a present    Speak with MS Royce Macadamia NP ( kidney )  about starting AARB  and replacing the Hydralazine .  *If you need a refill on your cardiac medications before your next appointment, please call your pharmacy*  Other Instructions    Blood pressure  target  should be 120- 130/85    Daily dry  weight-  if you are over 3 lbs  then double your dose of Lasix If you are  3 lbs under your dry weight then take 1/2 of your Lasix dose for the  day.  As you lose weight  you will have to adjust  your daily  dry weight     Lab Work: Not needed    Testing/Procedures:  Not needed  Follow-Up: At [facility], you and your health needs are our priority.  As part of our continuing mission to provide you with exceptional heart care, we have created designated Provider Care Teams.  These Care Teams include your primary Cardiologist (physician) and Advanced Practice Providers (APPs -  Physician Assistants and Nurse Practitioners) who all work together to provide you with the care you need, when you need it.     Your next appointment:   3 month(s)  The format for your next appointment:   In Person  Provider:   Glenetta Hew, MD    Other Instructions    Blood pressure  target  should be 120- 130/85    Daily dry  weight-  if you are over 3 lbs  then double your dose of Lasix If you are  3 lbs under your dry weight then take 1/2 of your Lasix dose for the day.  As you lose weight  you will have to adjust your daily  dry weight      Leonie Man, MD, MS Glenetta Hew, M.D., M.S. Interventional Cardiologist  [facility]  Pager # [phone] Phone # [phone] [address] [place], [place] [address]   Thank you for choosing [facility] [place]!!

## 2021-12-13 NOTE — Patient Instructions (Addendum)
Medication Instructions:    No changes at a present    Speak with MS Royce Macadamia NP ( kidney )  about starting AARB  and replacing the Hydralazine .  *If you need a refill on your cardiac medications before your next appointment, please call your pharmacy*  Other Instructions    Blood pressure  target  should be 120- 130/85    Daily dry  weight-  if you are over 3 lbs  then double your dose of Lasix If you are  3 lbs under your dry weight then take 1/2 of your Lasix dose for the day.  As you lose weight  you will have to adjust your daily  dry weight     Lab Work: Not needed    Testing/Procedures:  Not needed  Follow-Up: At [facility], you and your health needs are our priority.  As part of our continuing mission to provide you with exceptional heart care, we have created designated Provider Care Teams.  These Care Teams include your primary Cardiologist (physician) and Advanced Practice Providers (APPs -  Physician Assistants and Nurse Practitioners) who all work together to provide you with the care you need, when you need it.     Your next appointment:   3 month(s)  The format for your next appointment:   In Person  Provider:   Glenetta Hew, MD    Other Instructions    Blood pressure  target  should be 120- 130/85    Daily dry  weight-  if you are over 3 lbs  then double your dose of Lasix If you are  3 lbs under your dry weight then take 1/2 of your Lasix dose for the day.  As you lose weight  you will have to adjust your daily  dry weight

## 2021-12-13 NOTE — Telephone Encounter (Signed)
Refill of pantoprazole sent into patient's pharmacy.   Junius Argyle, PharmD, [place], CPP Clinical Pharmacist Practitioner  [facility]  [phone]

## 2021-12-14 ENCOUNTER — Telehealth (INDEPENDENT_AMBULATORY_CARE_PROVIDER_SITE_OTHER): Payer: No Typology Code available for payment source | Admitting: Family Medicine

## 2021-12-14 NOTE — Progress Notes (Deleted)
TeleHealth Visit:  This visit was completed with telemedicine (audio/video) technology. Stacey Page has verbally consented to this TeleHealth visit. The patient is located at home, the provider is located at home. The participants in this visit include the listed provider and patient. The visit was conducted today via MyChart video.  Stacey Page is here to discuss her progress with her Stacey treatment plan along with follow-up of her Stacey related diagnoses.   Today's visit was # 42 Starting weight: 344 lbs Starting date: 03/04/2021 Weight at last in office visit: 335 lbs on 11/11/21 Total weight loss: 9 lbs at last in office visit on 11/11/21. Today's reported weight: *** lbs No weight reported.  Nutrition Plan: the Category 2 Plan.   Current exercise: {exercise types:16438} leg exercises 5 minutes 3-4  times per week.   Interim History: ***  Assessment/Plan:  1. ***  2. ***  3. ***  Stacey: Current BMI *** Stacey Page {CHL AMB IS/IS NOT:210130109} currently in the action stage of change. As such, her goal is to {MWMwtloss#1:210800005}.  She has agreed to {MWMwtlossportion/plan2:23431}.   Exercise goals: {MWM EXERCISE RECS:23473}  Behavioral modification strategies: {MWMwtlossdietstrategies3:23432}.  Stacey Page has agreed to follow-up with our clinic in {NUMBER 1-10:22536} weeks.   No orders of the defined types were placed in this encounter.   There are no discontinued medications.   No orders of the defined types were placed in this encounter.     Objective:   VITALS: Per patient if applicable, see vitals. GENERAL: Alert and in no acute distress. CARDIOPULMONARY: No increased WOB. Speaking in clear sentences.  PSYCH: Pleasant and cooperative. Speech normal rate and rhythm. Affect is appropriate. Insight and judgement are appropriate. Attention is focused, linear, and appropriate.  NEURO: Oriented as arrived to appointment on time with no prompting.    Lab Results  Component Value Date   CREATININE 1.64 (H) 11/12/2021   BUN 20 11/12/2021   NA 143 11/12/2021   K 3.8 11/12/2021   CL 99 11/12/2021   CO2 30 (H) 11/12/2021   Lab Results  Component Value Date   ALT 7 07/14/2021   AST 10 (L) 07/14/2021   ALKPHOS 65 07/14/2021   BILITOT 0.3 07/14/2021   Lab Results  Component Value Date   HGBA1C 6.3 (H) 11/12/2021   HGBA1C 6.0 (A) 07/14/2021   HGBA1C 6.4 (A) 04/14/2021   HGBA1C 6.1 (H) 10/14/2020   HGBA1C 6.1 05/14/2020   Lab Results  Component Value Date   INSULIN 8.6 10/28/2019   INSULIN 9.2 07/04/2019   INSULIN 8.8 03/25/2019   Lab Results  Component Value Date   TSH 0.51 11/28/2018   Lab Results  Component Value Date   CHOL 116 05/14/2020   HDL 51.60 05/14/2020   LDLCALC 52 05/14/2020   TRIG 61.0 05/14/2020   CHOLHDL 2 05/14/2020   Lab Results  Component Value Date   WBC 3.1 (L) 10/15/2021   HGB 13.3 10/15/2021   HGB 13.3 10/15/2021   HCT 39.0 10/15/2021   HCT 39.0 10/15/2021   MCV 95.4 10/15/2021   PLT 171 10/15/2021   Lab Results  Component Value Date   IRON 41 10/14/2021   TIBC 279 10/14/2021   FERRITIN 51 10/14/2021   Lab Results  Component Value Date   VD25OH 57.6 10/28/2019   VD25OH 41.5 07/04/2019   VD25OH 38.2 03/25/2019    Attestation Statements:   Reviewed by clinician on day of visit: allergies, medications, problem list, medical history, surgical history, family history, social history, and  previous encounter notes.  ***(delete if time-based billing not used) Time spent on visit including the items listed below was *** minutes.  -preparing to see the patient (e.g., review of tests, history, previous notes) -obtaining and/or reviewing separately obtained history -counseling and educating the patient/family/caregiver -documenting clinical information in the electronic or other health record -ordering medications, tests, or procedures -independently interpreting results and  communicating results to the patient/ family/caregiver -referring and communicating with other health care professionals  -care coordination

## 2021-12-15 ENCOUNTER — Telehealth: Payer: Self-pay

## 2021-12-15 NOTE — Patient Outreach (Signed)
  Care Coordination   12/14/2021 Name: Stacey Page MRN: [id number] DOB: November 27, 1950   Care Coordination Outreach Attempts:  An unsuccessful telephone outreach was attempted today to offer the patient information about available care coordination services as a benefit of their health plan.   Follow Up Plan:  Additional outreach attempts will be made to offer the patient care coordination information and services.   Encounter Outcome:  No Answer  Care Coordination Interventions Activated:  No   Care Coordination Interventions:  No, not indicated    Lazaro Arms RN, BSN, [facility]   Phone: [phone]

## 2021-12-15 NOTE — Progress Notes (Signed)
Chronic Care Management Pharmacy Assistant   Name: Stacey Page  MRN: [id number] DOB: 04-10-1950  Reason for Encounter: Medication Review/Medication Coordination Call.    Recent office visits:  12/02/2021 Dr. Ethelene Hal MD (PCP) Start prednisone 10 mg 2 times daily  Recent consult visits:  12/13/2021 Dr. Ellyn Hack MD (Cardiology) No Medication Changes noted  Hospital visits:  None in previous 6 months  Medications: Outpatient Encounter Medications as of 12/15/2021  Medication Sig   albuterol (VENTOLIN HFA) 108 (90 Base) MCG/ACT inhaler INHALE 1-2 PUFFS BY MOUTH into THE lungs every SIX hours AS NEEDED FOR WHEEZING AND/OR SHORTNESS OF BREATH   aspirin 81 MG tablet Take 1 tablet (81 mg total) by mouth daily with breakfast.   Blood Glucose Monitoring Suppl ([place]) w/Device KIT 1 kit by Does not apply route daily as needed.   carvedilol (COREG) 25 MG tablet Take 1 tablet (25 mg total) by mouth 2 (two) times daily.   cholecalciferol (VITAMIN D3) 25 MCG (1000 UNIT) tablet Take 2,000 Units by mouth daily.   Continuous Blood Gluc Sensor (FREESTYLE LIBRE 14 DAY SENSOR) MISC Place one sensor on the skin every 14 days to monitor blood sugars continuously as directed.   Docusate Sodium 100 MG capsule Take 100 mg by mouth 2 (two) times daily.   empagliflozin (JARDIANCE) 10 MG TABS tablet Take 1 tablet (10 mg total) by mouth daily before breakfast.   ferrous sulfate 325 (65 FE) MG tablet Take 325 mg by mouth 2 (two) times daily with a meal.   fluticasone (FLONASE) 50 MCG/ACT nasal spray instill ONE SPRAY in each nostril daily (Patient taking differently: Place 1 spray into both nostrils daily.)   furosemide (LASIX) 80 MG tablet Take 1 tablet (80 mg total) by mouth daily.   gabapentin (NEURONTIN) 100 MG capsule Take 1 capsule (100 mg total) by mouth 2 (two) times daily.   glucose blood (ONETOUCH VERIO) test strip Use to monitor blood sugar twice daily as instructed    hydrALAZINE (APRESOLINE) 50 MG tablet Take 1 tablet (50 mg total) by mouth every 8 (eight) hours.   isosorbide dinitrate (ISORDIL) 10 MG tablet Take 1 tablet (10 mg total) by mouth 3 (three) times daily.   montelukast (SINGULAIR) 10 MG tablet Take 1 tablet (10 mg total) by mouth at bedtime.   OneTouch Delica Lancets 63K MISC Use to monitor blood sugars once daily as directed   pantoprazole (PROTONIX) 40 MG tablet Take 1 tablet (40 mg total) by mouth daily.   No facility-administered encounter medications on file as of 12/15/2021.    Care Gaps: None ID   Star Rating Drugs: Atorvastatin 20 mg last filled 09/27/2021 90 day supply at [facility].   Reviewed chart for medication changes ahead of medication coordination call.  BP Readings from Last 3 Encounters:  12/13/21 138/86  12/02/21 122/66  11/16/21 (!) 144/60    Lab Results  Component Value Date   HGBA1C 6.3 (H) 11/12/2021     Patient obtains medications through Vials  90 Days   Last adherence delivery included:  Lisinopril 20 mg one tablet daily Montelukast 10 mg one tablet daily  Atorvastatin 20 mg one tablet daily Carvedilol 25 mg one tablet twice daily Gabapentin 100 mg one capsule 2 times daily Flonase 50 MCG/ACT nasal spray  Hydralazine 50 mg one tablet 3 times daily  Isosorbide dinitrate 20 mg in the morning and evening with Hydralazine 50 mg dose   Patient declined medications last month: Furosemide  40 mg one tablet four times per week (Adequate supply)  Aspirin 81 mg one tablet daily - OTC  Albuterol 108 MCG/ACT inhaler- PRN (Adequate supply)   Patient is due for next adherence delivery on: 12/27/2021. Called patient and reviewed medications and coordinated delivery.  This delivery to include: Montelukast 10 mg one tablet daily  Atorvastatin 20 mg one tablet daily Carvedilol 25 mg one tablet twice daily Gabapentin 100 mg one capsule 2 times daily Flonase 50 MCG/ACT nasal spray  Hydralazine 50 mg  one tablet 3 times daily  Isosorbide dinitrate 20 mg in the morning and evening with Hydralazine 50 mg dose Pantoprazole 40 mg 1 tablet daily  Freestyle Libre 14 day sensor -  Place one sensor on the skin every 14 days  Jardiance 10 mg-  1 tablet daily before breakfast-    Patient declined the following medications: Lisinopril 20 mg one tablet daily - Patient states medication Was stop Furosemide 40 mg one tablet four times per week (Adequate supply)  Aspirin 81 mg one tablet daily - OTC  Albuterol 108 MCG/ACT inhaler- PRN (Adequate supply)  Test Strips- Adequate Supply Lancets - adequate Supply  Patient needs refills for Jardiance - prescribe by PCP, Carvedilol,Atorvastatin, .  Confirmed delivery date of 12/17/2021, advised patient that pharmacy will contact them the morning of delivery.  [place] Pharmacist Assistant [phone]

## 2021-12-16 ENCOUNTER — Other Ambulatory Visit: Payer: Self-pay | Admitting: Cardiology

## 2021-12-16 ENCOUNTER — Other Ambulatory Visit: Payer: Self-pay | Admitting: Nurse Practitioner

## 2021-12-16 DIAGNOSIS — J309 Allergic rhinitis, unspecified: Secondary | ICD-10-CM

## 2021-12-16 DIAGNOSIS — G63 Polyneuropathy in diseases classified elsewhere: Secondary | ICD-10-CM

## 2021-12-16 DIAGNOSIS — E785 Hyperlipidemia, unspecified: Secondary | ICD-10-CM

## 2021-12-16 DIAGNOSIS — I10 Essential (primary) hypertension: Secondary | ICD-10-CM

## 2021-12-16 NOTE — Telephone Encounter (Signed)
Chart supports Rx Last OV: 11/2021 Next OV: [id number]

## 2021-12-16 NOTE — Progress Notes (Signed)
TeleHealth Visit:  This visit was completed with telemedicine (audio/video) technology. Stacey Page has verbally consented to this TeleHealth visit. The patient is located at home, the provider is located at home. The participants in this visit include the listed provider and patient. The visit was conducted today via MyChart video.  OBESITY Stacey Page is here to discuss her progress with her obesity treatment plan along with follow-up of her obesity related diagnoses.   Today's visit was # 42 Starting weight: 344 lbs Starting date: 03/25/19 Weight at last in office visit: 335 lbs on 11/11/21 Total weight loss: 9 lbs at last in office visit on 11/11/21. Today's reported weight: 331 lbs   Nutrition Plan: the Category 2 Plan.   Current exercise: leg exercises 5 minutes 3-4  times per week.  Interim History: Stacey Page reports that she has lost a few pounds.  She is following the category 2 plan currently. Typical day of food: Breakfast-special K cereal, banana, fair life milk Lunch-sandwich or salad with 4 ounces of meat Dinner-chicken with vegetables and potato  Drinks only water-no sugar sweetened beverages.  She reports hunger satisfied but she occasionally has sweets cravings.  She has severe heel pain, hammertoes, neuropathy and will be getting new orthopedic shoes next week.  She hopes to be able to start walking for exercise.  Assessment/Plan:  1. 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 Last A1c was 6.3 CBGs: Fasting 96-124 Episodes of hypoglycemia: no Medication(s): Jardiance 10 mg daily.  Lab Results  Component Value Date   HGBA1C 6.3 (H) 11/12/2021   HGBA1C 6.0 (A) 07/14/2021   HGBA1C 6.4 (A) 04/14/2021   Lab Results  Component Value Date   MICROALBUR 1.4 05/14/2020   LDLCALC 52 05/14/2020   CREATININE 1.64 (H) 11/12/2021    Plan: Continue Jardiance 10 mg daily.  2.  Chronic diastolic heart failure Stacey Page was  hospitalized 1 September with acute heart failure exacerbation due to med noncompliance.  She reports she has been compliant with her medications including Lasix.  Had cardiology visit with Dr. Ellyn Hack on 12/13/2021.  She weighs daily and adjust Lasix accordingly per cardiology instructions.  Plan: Continue all medications. Follow-up with cardiology as directed-appointment 03/22/2022. Continue daily weights.  3. Obesity: Current BMI 51.0 [name] is currently in the action stage of change. As such, her goal is to continue with weight loss efforts.  She has agreed to the Category 2 Plan and keeping a food journal and adhering to recommended goals of 1200-1300 calories and 85 grams protein daily.  MyChart message sent with journaling information.  Exercise goals: Continue leg exercises.  Begin walking as tolerated after she receives new orthopedic shoes.  Behavioral modification strategies: increasing lean protein intake, decreasing simple carbohydrates, planning for success, and keeping a strict food journal.  Stacey Page has agreed to follow-up with our clinic in 4 weeks.   No orders of the defined types were placed in this encounter.   There are no discontinued medications.   No orders of the defined types were placed in this encounter.     Objective:   VITALS: Per patient if applicable, see vitals. GENERAL: Alert and in no acute distress. CARDIOPULMONARY: No increased WOB. Speaking in clear sentences.  PSYCH: Pleasant and cooperative. Speech normal rate and rhythm. Affect is appropriate. Insight and judgement are appropriate. Attention is focused, linear, and appropriate.  NEURO: Oriented as arrived to appointment on time with no prompting.   Lab Results  Component Value  Date   CREATININE 1.64 (H) 11/12/2021   BUN 20 11/12/2021   NA 143 11/12/2021   K 3.8 11/12/2021   CL 99 11/12/2021   CO2 30 (H) 11/12/2021   Lab Results  Component Value Date   ALT 7 07/14/2021   AST 10  (L) 07/14/2021   ALKPHOS 65 07/14/2021   BILITOT 0.3 07/14/2021   Lab Results  Component Value Date   HGBA1C 6.3 (H) 11/12/2021   HGBA1C 6.0 (A) 07/14/2021   HGBA1C 6.4 (A) 04/14/2021   HGBA1C 6.1 (H) 10/14/2020   HGBA1C 6.1 05/14/2020   Lab Results  Component Value Date   INSULIN 8.6 10/28/2019   INSULIN 9.2 07/04/2019   INSULIN 8.8 03/25/2019   Lab Results  Component Value Date   TSH 0.51 11/28/2018   Lab Results  Component Value Date   CHOL 116 05/14/2020   HDL 51.60 05/14/2020   LDLCALC 52 05/14/2020   TRIG 61.0 05/14/2020   CHOLHDL 2 05/14/2020   Lab Results  Component Value Date   WBC 3.1 (L) 10/15/2021   HGB 13.3 10/15/2021   HGB 13.3 10/15/2021   HCT 39.0 10/15/2021   HCT 39.0 10/15/2021   MCV 95.4 10/15/2021   PLT 171 10/15/2021   Lab Results  Component Value Date   IRON 41 10/14/2021   TIBC 279 10/14/2021   FERRITIN 51 10/14/2021   Lab Results  Component Value Date   VD25OH 57.6 10/28/2019   VD25OH 41.5 07/04/2019   VD25OH 38.2 03/25/2019    Attestation Statements:   Reviewed by clinician on day of visit: allergies, medications, problem list, medical history, surgical history, family history, social history, and previous encounter notes.  Time spent on visit including the items listed below was 35 minutes.  -preparing to see the patient (e.g., review of tests, history, previous notes) -obtaining and/or reviewing separately obtained history -counseling and educating the patient/family/caregiver -documenting clinical information in the electronic or other health record

## 2021-12-20 ENCOUNTER — Encounter: Payer: Self-pay | Admitting: Family Medicine

## 2021-12-20 ENCOUNTER — Telehealth (INDEPENDENT_AMBULATORY_CARE_PROVIDER_SITE_OTHER): Payer: No Typology Code available for payment source | Admitting: Family Medicine

## 2021-12-20 ENCOUNTER — Encounter (INDEPENDENT_AMBULATORY_CARE_PROVIDER_SITE_OTHER): Payer: Self-pay | Admitting: Family Medicine

## 2021-12-20 DIAGNOSIS — I129 Hypertensive chronic kidney disease with stage 1 through stage 4 chronic kidney disease, or unspecified chronic kidney disease: Secondary | ICD-10-CM | POA: Diagnosis not present

## 2021-12-20 DIAGNOSIS — Z6841 Body Mass Index (BMI) 40.0 and over, adult: Secondary | ICD-10-CM

## 2021-12-20 DIAGNOSIS — D631 Anemia in chronic kidney disease: Secondary | ICD-10-CM | POA: Diagnosis not present

## 2021-12-20 DIAGNOSIS — I5032 Chronic diastolic (congestive) heart failure: Secondary | ICD-10-CM | POA: Diagnosis not present

## 2021-12-20 DIAGNOSIS — E66813 Obesity, class 3: Secondary | ICD-10-CM

## 2021-12-20 DIAGNOSIS — E1122 Type 2 diabetes mellitus with diabetic chronic kidney disease: Secondary | ICD-10-CM | POA: Diagnosis not present

## 2021-12-20 DIAGNOSIS — E669 Obesity, unspecified: Secondary | ICD-10-CM

## 2021-12-20 DIAGNOSIS — N2581 Secondary hyperparathyroidism of renal origin: Secondary | ICD-10-CM | POA: Diagnosis not present

## 2021-12-20 DIAGNOSIS — N184 Chronic kidney disease, stage 4 (severe): Secondary | ICD-10-CM | POA: Diagnosis not present

## 2021-12-22 ENCOUNTER — Telehealth: Payer: Self-pay | Admitting: Family Medicine

## 2021-12-22 ENCOUNTER — Ambulatory Visit: Payer: No Typology Code available for payment source

## 2021-12-22 NOTE — Progress Notes (Signed)
Pt here for BP check BP reading is 116/39 advise pt if she light headedness dizziness to call the office to speak with the triage nurse if calling after hours.

## 2021-12-22 NOTE — Telephone Encounter (Signed)
FYI:Pt's daughter called in for her mom stating, pt's bp earlier today 12/22/21 was 102/42. She has been very sluggish today with her speech also being slow. I transferred her over to nurse triage.

## 2021-12-23 ENCOUNTER — Other Ambulatory Visit: Payer: Self-pay | Admitting: Nurse Practitioner

## 2021-12-23 ENCOUNTER — Ambulatory Visit (INDEPENDENT_AMBULATORY_CARE_PROVIDER_SITE_OTHER): Payer: No Typology Code available for payment source | Admitting: Family Medicine

## 2021-12-23 ENCOUNTER — Encounter: Payer: Self-pay | Admitting: Family Medicine

## 2021-12-23 ENCOUNTER — Telehealth: Payer: Self-pay | Admitting: Family Medicine

## 2021-12-23 ENCOUNTER — Other Ambulatory Visit: Payer: Self-pay | Admitting: Family Medicine

## 2021-12-23 VITALS — BP 120/64 | HR 69 | Temp 97.9°F | Ht 68.0 in | Wt 339.0 lb

## 2021-12-23 DIAGNOSIS — T464X5A Adverse effect of angiotensin-converting-enzyme inhibitors, initial encounter: Secondary | ICD-10-CM

## 2021-12-23 DIAGNOSIS — I959 Hypotension, unspecified: Secondary | ICD-10-CM

## 2021-12-23 DIAGNOSIS — E785 Hyperlipidemia, unspecified: Secondary | ICD-10-CM

## 2021-12-23 DIAGNOSIS — N184 Chronic kidney disease, stage 4 (severe): Secondary | ICD-10-CM

## 2021-12-23 DIAGNOSIS — T50905A Adverse effect of unspecified drugs, medicaments and biological substances, initial encounter: Secondary | ICD-10-CM

## 2021-12-23 DIAGNOSIS — E1122 Type 2 diabetes mellitus with diabetic chronic kidney disease: Secondary | ICD-10-CM

## 2021-12-23 NOTE — Telephone Encounter (Signed)
[facility] is calling needing a script for Lipitor. They say pt called in stating she was still taking this. I did not see on this med on her list. Please advise [facility] at  Phone: [phone]   Pharmacy  [facility] - [place], [place] - [address], [place] [address] Phone: [phone]  Fax: [phone]

## 2021-12-23 NOTE — Telephone Encounter (Signed)
OV on 12/23/21 with PCP to evaluate BP

## 2021-12-23 NOTE — Telephone Encounter (Signed)
Patient no longer on Lipitor called pharmacy to inform but they were closed. Will call back.

## 2021-12-23 NOTE — Progress Notes (Signed)
Established Patient Office Visit  Subjective   Patient ID: Stacey Page, female    DOB: 04-23-1950  Age: 71 y.o. MRN: [id number]  Chief Complaint  Patient presents with   Follow-up    Follow up on lower BP readings.     HPI for follow-up of medication reaction with significant hypotension since Monday.  On Monday nephrology added 10 mg of lisinopril to her current regimen.  And her blood pressure dropped.  She has since discontinued the lisinopril and has been holding her hydralazine and isosorbide dinitrate.  Her blood pressure has been increasing and she is feeling more like her usual self.     Review of Systems  Constitutional:  Positive for malaise/fatigue.  HENT: Negative.    Eyes:  Negative for blurred vision, discharge and redness.  Respiratory: Negative.    Cardiovascular: Negative.   Gastrointestinal:  Negative for abdominal pain.  Genitourinary: Negative.   Musculoskeletal: Negative.  Negative for myalgias.  Skin:  Negative for rash.  Neurological:  Positive for dizziness. Negative for tingling, loss of consciousness and weakness.  Endo/Heme/Allergies:  Negative for polydipsia.      Objective:     BP 120/64 (BP Location: Left Arm, Patient Position: Sitting, Cuff Size: Large)   Pulse 69   Temp 97.9 F (36.6 C) (Temporal)   Ht '5\' 8"'$  (1.727 m)   Wt (!) 339 lb (153.8 kg)   SpO2 96%   BMI 51.54 kg/m  BP Readings from Last 3 Encounters:  12/23/21 120/64  12/22/21 (!) 116/39  12/13/21 138/86   Wt Readings from Last 3 Encounters:  12/23/21 (!) 339 lb (153.8 kg)  12/13/21 (!) 339 lb 3.2 oz (153.9 kg)  12/02/21 (!) 337 lb (152.9 kg)      Physical Exam Constitutional:      General: She is not in acute distress.    Appearance: Normal appearance. She is not ill-appearing, toxic-appearing or diaphoretic.  HENT:     Head: Normocephalic and atraumatic.     Right Ear: External ear normal.     Left Ear: External ear normal.  Eyes:     General: No scleral  icterus.       Right eye: No discharge.        Left eye: No discharge.     Extraocular Movements: Extraocular movements intact.     Conjunctiva/sclera: Conjunctivae normal.  Cardiovascular:     Rate and Rhythm: Normal rate and regular rhythm.  Pulmonary:     Effort: Pulmonary effort is normal. No respiratory distress.     Breath sounds: Normal breath sounds.  Musculoskeletal:     Cervical back: No rigidity or tenderness.  Skin:    General: Skin is warm and dry.  Neurological:     Mental Status: She is alert and oriented to person, place, and time.  Psychiatric:        Mood and Affect: Mood normal.        Behavior: Behavior normal.      No results found for any visits on 12/23/21.    The ASCVD Risk score (Arnett DK, et al., 2019) failed to calculate for the following reasons:   The valid total cholesterol range is 130 to 320 mg/dL    Assessment & Plan:   Problem List Items Addressed This Visit   None Visit Diagnoses     Adverse effect of drug, initial encounter    -  Primary       Return restart hydralazine and isorbide dinitrate bid  on monday per thursday. Then take both 3 times daily..  Continue hold on lisinopril.  We will restart hydralazine and isosorbide twice daily on Monday through Thursday.  Then she will increase each to her standing dose to 3 times daily.  Follow back up with any issues.  Nephrology is working on obtaining Florence for her.  It is cheaper for her than the Jardiance.  [name], MD

## 2021-12-24 ENCOUNTER — Telehealth: Payer: Self-pay | Admitting: Adult Health

## 2021-12-24 ENCOUNTER — Ambulatory Visit (INDEPENDENT_AMBULATORY_CARE_PROVIDER_SITE_OTHER): Payer: No Typology Code available for payment source | Admitting: Adult Health

## 2021-12-24 ENCOUNTER — Encounter: Payer: Self-pay | Admitting: Adult Health

## 2021-12-24 VITALS — BP 124/60 | HR 66 | Temp 97.9°F | Ht 68.0 in | Wt 338.0 lb

## 2021-12-24 DIAGNOSIS — G4733 Obstructive sleep apnea (adult) (pediatric): Secondary | ICD-10-CM | POA: Diagnosis not present

## 2021-12-24 DIAGNOSIS — I5032 Chronic diastolic (congestive) heart failure: Secondary | ICD-10-CM

## 2021-12-24 DIAGNOSIS — J849 Interstitial pulmonary disease, unspecified: Secondary | ICD-10-CM

## 2021-12-24 DIAGNOSIS — J9611 Chronic respiratory failure with hypoxia: Secondary | ICD-10-CM

## 2021-12-24 NOTE — Telephone Encounter (Signed)
On return visit patient will need pulmonary function testing.  Call patient to let her know we will need to get the set up and also ordered a high-resolution CT chest to evaluate her lungs and shortness of breath  Left message on machine to call back

## 2021-12-24 NOTE — Progress Notes (Unsigned)
_0  ID: Stacey Page, female    DOB: 21-Jun-1950, 71 y.o.   MRN: [id number]  Chief Complaint  Patient presents with   Follow-up    Referring provider: [name]  HPI: 71 year old female former smoker followed for asthma, sleep apnea, history of interstitial lung disease secondary to MAI (VATS biopsy May 2016 bronchiolitis with scattered nonnecrotizing granulomatous inflammation with atypical Mycobacterium) Medical history significant for chronic diastolic heart failure, aortic valve stenosis, chronic kidney disease and diabetes  TEST/EVENTS :  Echo 01/28/14 >> severe LVH, EF 65 to 67%, grade 2 diastolic dysfx, mild AS, mild/mod LA. HST 02/12/14 >> AHI 59.3, SaO2 low 67% Auto CPAP 03/07/14 to 03/09/14 >> used on 3 of 3 nights with average 8 hrs and 8 min.  Average AHI is 3.3 with median CPAP 12 cm H2O and 95 th percentile CPAP 14 cm H20. PFT 03/12/14 >> FEV1 2.05 (88%), FEV1% 87, TLC 4.14 (75%), DLCO 52%, no BD HRCT chest 03/20/14 >> multiple small nodules, patchy GGO, mild BTX, air trapping  Coronary CT chest October 06, 2017 scattered groundglass opacities without significant change  12/24/2021 Follow up : ILD , OSA , Asthma  Patient presents for a 1 month follow-up.  She had recent hospitalization in September for decompensated heart failure and hypercarbic respiratory failure.  She was aggressively diuresed.  Underwent right heart cath that showed mild pulmonary hypertension WHO group 3.  Patient was discharged on oxygen.  Last visit she was ordered a POC device.  Patient has recently got this which says is helping her to get around more and be more mobile. Patient has sleep apnea and is on a CPAP machine.  She wears her CPAP machine every single night.  Says she cannot sleep without it.  Typically gets in about 6 hours of sleep.  CPAP download shows excellent compliance with 100% usage.  Daily average usage is 6 hours.  Patient is on auto CPAP 7 to 11 cm H2O.  AHI is  5.3/hour. Patient has a history of interstitial lung changes on CT chest.  She underwent VATS biopsy in 2016 with bronchiolitis with scattered nonnecrotizing granulomas inflammation with atypical Mycobacterium.  Patient has no significant cough.  Does get short of breath with activities.  Has albuterol inhaler that she rarely uses    No Known Allergies  Immunization History  Administered Date(s) Administered   Fluad Quad(high Dose 65+) 11/28/2018, 01/29/2020   Influenza Split 11/21/2016   Influenza, High Dose Seasonal PF 12/02/2021   Influenza,inj,Quad PF,6+ Mos 01/12/2015, 11/19/2015   PFIZER(Purple Top)SARS-COV-2 Vaccination 03/31/2019, 04/23/2019, 03/21/2020   Pneumococcal Conjugate-13 01/19/2017   Pneumococcal Polysaccharide-23 01/12/2015, 05/14/2020   Zoster Recombinat (Shingrix) 07/14/2021, 12/02/2021    Past Medical History:  Diagnosis Date   Anxiety    doesn't take any meds   Arthritis of right knee 03/14/2016   Asthma    Back pain    Chronic diastolic CHF (congestive heart failure) ([place])    HF with Preserved EF (60-65%) - Grade II Diastolic Dysfunction (Hypertensive Heart Disease). takes Furosemide daily   Depression    doesn't take meds   Diabetes ([place])    takes Januvia daily   Eczema    uses cream as needed   GERD (gastroesophageal reflux disease)    History of bronchitis as a child    HTN (hypertension)    Insomnia    Mild aortic stenosis by prior echocardiogram 04/2017   Mild stenosis: Mean gradient 15 mmHg, peak gradient 28 mmHg  Mycobacterium avium-intracellulare infection ([place]) 2016   OA (osteoarthritis)    Obese    OSA (obstructive sleep apnea) 02/25/2014   wears CPAP at night   Peripheral neuropathy    takes Gabapentin as needed   Pneumonia    hx of-2010   Seasonal allergies    uses Flonase daily   Sleep apnea     Tobacco History: Social History   Tobacco Use  Smoking Status Former   Packs/day: 0.25   Years: 15.00   Total pack years:  3.75   Types: Cigarettes   Quit date: 02/08/1980   Years since quitting: 41.9  Smokeless Tobacco Former   Counseling given: Not Answered   Outpatient Medications Prior to Visit  Medication Sig Dispense Refill   albuterol (VENTOLIN HFA) 108 (90 Base) MCG/ACT inhaler INHALE 1-2 PUFFS BY MOUTH into THE lungs every SIX hours AS NEEDED FOR WHEEZING AND/OR SHORTNESS OF BREATH 8.5 g 3   aspirin 81 MG tablet Take 1 tablet (81 mg total) by mouth daily with breakfast. 90 tablet 0   Blood Glucose Monitoring Suppl (ONETOUCH VERIO SYNC SYSTEM) w/Device KIT 1 kit by Does not apply route daily as needed. 1 kit 0   carvedilol (COREG) 25 MG tablet TAKE ONE TABLET BY MOUTH TWICE DAILY 180 tablet 3   cholecalciferol (VITAMIN D3) 25 MCG (1000 UNIT) tablet Take 2,000 Units by mouth daily.     Continuous Blood Gluc Sensor (FREESTYLE LIBRE 14 DAY SENSOR) MISC Place one sensor on the skin every 14 days to monitor blood sugars continuously as directed. 6 each 3   Docusate Sodium 100 MG capsule Take 100 mg by mouth 2 (two) times daily.     ferrous sulfate 325 (65 FE) MG tablet Take 325 mg by mouth 2 (two) times daily with a meal.     fluticasone (FLONASE) 50 MCG/ACT nasal spray instill ONE SPRAY in each nostril daily (Patient taking differently: Place 1 spray into both nostrils daily.) 16 g 3   furosemide (LASIX) 80 MG tablet Take 1 tablet (80 mg total) by mouth daily. 30 tablet 6   gabapentin (NEURONTIN) 100 MG capsule TAKE ONE CAPSULE BY MOUTH TWICE DAILY 180 capsule 1   glucose blood (ONETOUCH VERIO) test strip Use to monitor blood sugar twice daily as instructed 200 each 3   hydrALAZINE (APRESOLINE) 50 MG tablet Take 1 tablet (50 mg total) by mouth every 8 (eight) hours. (Patient not taking: Reported on 12/23/2021) 90 tablet 3   isosorbide dinitrate (ISORDIL) 10 MG tablet Take 1 tablet (10 mg total) by mouth 3 (three) times daily. (Patient not taking: Reported on 12/23/2021) 90 tablet 3   JARDIANCE 10 MG TABS  tablet TAKE ONE TABLET BY MOUTH ONCE DAILY BEFORE BREAKFAST 30 tablet 2   montelukast (SINGULAIR) 10 MG tablet TAKE ONE TABLET BY MOUTH EVERYDAY AT BEDTIME 90 tablet 3   OneTouch Delica Lancets 25E MISC Use to monitor blood sugars once daily as directed 100 each 11   pantoprazole (PROTONIX) 40 MG tablet Take 1 tablet (40 mg total) by mouth daily. 90 tablet 1   No facility-administered medications prior to visit.     Review of Systems:   Constitutional:   No  weight loss, night sweats,  Fevers, chills, + fatigue, or  lassitude.  HEENT:   No headaches,  Difficulty swallowing,  Tooth/dental problems, or  Sore throat,                No sneezing, itching, ear ache, nasal  congestion, post nasal drip,   CV:  No chest pain,  Orthopnea, PND, swelling in lower extremities, anasarca, dizziness, palpitations, syncope.   GI  No heartburn, indigestion, abdominal pain, nausea, vomiting, diarrhea, change in bowel habits, loss of appetite, bloody stools.   Resp:   No chest wall deformity  Skin: no rash or lesions.  GU: no dysuria, change in color of urine, no urgency or frequency.  No flank pain, no hematuria   MS:  No joint pain or swelling.  No decreased range of motion.  No back pain.    Physical Exam  BP 124/60 (BP Location: Left Arm, Patient Position: Sitting, Cuff Size: Normal)   Pulse 66   Temp 97.9 F (36.6 C) (Oral)   Ht _0  (1.727 m)   Wt (!) 338 lb (153.3 kg)   SpO2 91%   BMI 51.39 kg/m   GEN: A/Ox3; pleasant , NAD, well nourished    HEENT:  [place]/AT,  , NOSE-clear, THROAT-clear, no lesions, no postnasal drip or exudate noted.   NECK:  Supple w/ fair ROM; no JVD; normal carotid impulses w/o bruits; no thyromegaly or nodules palpated; no lymphadenopathy.    RESP  Clear  P & A; w/o, wheezes/ rales/ or rhonchi. no accessory muscle use, no dullness to percussion  CARD:  RRR, no m/r/g, no peripheral edema, pulses intact, no cyanosis or clubbing.  GI:   Soft & nt; nml bowel  sounds; no organomegaly or masses detected.   Musco: Warm bil, no deformities or joint swelling noted.   Neuro: alert, no focal deficits noted.    Skin: Warm, no lesions or rashes    Lab Results:   BMET   BNP ProBNP   Imaging: No results found.       Latest Ref Rng & Units 03/12/2014    8:43 AM  PFT Results  FVC-Pre L 2.37   FVC-Predicted Pre % 80   FVC-Post L 2.35   FVC-Predicted Post % 79   Pre FEV1/FVC % % 87   Post FEV1/FCV % % 87   FEV1-Pre L 2.05   FEV1-Predicted Pre % 88   FEV1-Post L 2.05   DLCO uncorrected ml/min/mmHg 14.84   DLCO UNC% % 52   DLVA Predicted % 72   TLC L 4.14   TLC % Predicted % 75   RV % Predicted % 72     No results found for: "NITRICOXIDE"      Assessment & Plan:   CHF (congestive heart failure) (HCC) Appears euvolemic on exam.  Continue follow-up with cardiology  Chronic respiratory failure with hypoxia (HCC) Continue on oxygen to maintain O2 saturations greater than 88 to 90% Check overnight oximetry test on CPAP with oxygen  ILD (interstitial lung disease) 2/2 MAI s/p med rxn History of interstitial changes on CT with VATS biopsy that showed bronchiolitis with Mycobacterium.  We will set up PFTs on return.  And high-resolution CT chest  Plan  Patient Instructions  Continue on Oxygen 2l/m , goal is to keep Oxygen >88-90% Continue on CPAP At bedtime with Oxygen 2l/m  Check Overnight oximetry test on CPAP with Oxygen  Activity as tolerated  Continue on Lasix  Albuterol inhaler As needed   Continue on Singulair and Flonase daily  Set up for HRCT Chest .  Follow up with .Dr. Halford Chessman or Zaheer Wageman NP  in 2 months with PFT  and As needed  Please contact office for sooner follow up if symptoms do not improve or worsen  or seek emergency care     OSA (obstructive sleep apnea) Excellent control compliance on nocturnal CPAP.  No changes   I spent   42 minutes dedicated to the care of this patient on the date of this  encounter to include pre-visit review of records, face-to-face time with the patient discussing conditions above, post visit ordering of testing, clinical documentation with the electronic health record, making appropriate referrals as documented, and communicating necessary findings to members of the patients care team.    Rexene Edison, NP 12/24/2021

## 2021-12-24 NOTE — Assessment & Plan Note (Addendum)
Continue on oxygen to maintain O2 saturations greater than 88 to 90% Check overnight oximetry test on CPAP with oxygen

## 2021-12-24 NOTE — Patient Instructions (Addendum)
Continue on Oxygen 2l/m , goal is to keep Oxygen >88-90% Continue on CPAP At bedtime with Oxygen 2l/m  Check Overnight oximetry test on CPAP with Oxygen  Activity as tolerated  Continue on Lasix  Albuterol inhaler As needed   Continue on Singulair and Flonase daily  Set up for HRCT Chest .  Follow up with .Dr. Halford Chessman or Sherrel Shafer NP  in 2 months with PFT  and As needed  Please contact office for sooner follow up if symptoms do not improve or worsen or seek emergency care

## 2021-12-24 NOTE — Assessment & Plan Note (Signed)
Appears euvolemic on exam.  Continue follow-up with cardiology 

## 2021-12-24 NOTE — Assessment & Plan Note (Addendum)
History of interstitial changes on CT with VATS biopsy that showed bronchiolitis with Mycobacterium.  We will set up PFTs on return.  And high-resolution CT chest  Plan  Patient Instructions  Continue on Oxygen 2l/m , goal is to keep Oxygen >88-90% Continue on CPAP At bedtime with Oxygen 2l/m  Check Overnight oximetry test on CPAP with Oxygen  Activity as tolerated  Continue on Lasix  Albuterol inhaler As needed   Continue on Singulair and Flonase daily  Set up for HRCT Chest .  Follow up with .Dr. Halford Chessman or Camreigh Michie NP  in 2 months with PFT  and As needed  Please contact office for sooner follow up if symptoms do not improve or worsen or seek emergency care

## 2021-12-24 NOTE — Assessment & Plan Note (Signed)
Excellent control compliance on nocturnal CPAP.  No changes

## 2021-12-27 ENCOUNTER — Telehealth: Payer: Self-pay | Admitting: Cardiology

## 2021-12-27 ENCOUNTER — Telehealth: Payer: Self-pay

## 2021-12-27 DIAGNOSIS — J9611 Chronic respiratory failure with hypoxia: Secondary | ICD-10-CM | POA: Diagnosis not present

## 2021-12-27 MED ORDER — ISOSORBIDE DINITRATE 10 MG PO TABS: 10.0000 mg | ORAL_TABLET | Freq: Three times a day (TID) | ORAL | 0 refills | Status: DC

## 2021-12-27 NOTE — Telephone Encounter (Signed)
Exactly correct.   DH      Note

## 2021-12-27 NOTE — Telephone Encounter (Signed)
Pt c/o medication issue:  1. Name of Medication: furosemide (LASIX) 80 MG tablet   2. How are you currently taking this medication (dosage and times per day)? Take 1 tablet (80 mg total) by mouth daily. - Oral   3. Are you having a reaction (difficulty breathing--STAT)? no  4. What is your medication issue? Pt wants to know when she can take less and when can she take more. She states she was told it was based off of her weight.

## 2021-12-27 NOTE — Progress Notes (Signed)
I reach out to patient Cardiology to request a refill for isosorbide 10 mg [facility]. I also ask for clarification if the patient should be taking Atorvastatin as it is not longer on her medication list, but patient reports she is taking it. Per Cardiology, she will send the message to the nurse to review.  [place] Pharmacist Assistant [phone]

## 2021-12-27 NOTE — Telephone Encounter (Signed)
Exactly correct.  [place]

## 2021-12-27 NOTE — Telephone Encounter (Signed)
Spoke to [employer] she stated pharmacist already took care of.She will care back if needed.

## 2021-12-27 NOTE — Telephone Encounter (Signed)
Spoke to patient and her daughter.They were calling to get clarification on lasix dose.Dr.Harding advised if weight is 3 lbs above dry weight double lasix dose that day and if dry weight 3 lbs under take 1/2 lasix dose that day.Advised if she starts losing weight Dr.Harding needs to be notified to change daily dose of lasix.

## 2021-12-27 NOTE — Telephone Encounter (Signed)
Received a call from [place], pt wants to know if she should still be taking atorvastatin because she says she's still taking it but it was discontinued by her PCP.   Dose, Route, Frequency: As Directed  Dispense Quantity: 90 tablet Refills: 3   Note to Pharmacy: Requesting 1 year supply       Sig: TAKE 1 TABLET BY MOUTH  DAILY AT 6 PM.  Patient not taking: Reported on 10/29/2021       Start Date: 02/16/21 End Date: 10/29/21  Discontinued by: Vanessa Barbara, RN on 10/29/2021 12:02  Reason: No longer needed (for PRN medications)       Written Date: 02/16/21 Expiration Date: 02/16/22     Associated Diagnoses: Hyperlipidemia LDL goal <70 [E78.5]  Original Order: atorvastatin (LIPITOR) 20 MG tablet [[id number]]

## 2021-12-27 NOTE — Telephone Encounter (Signed)
Refills has been sent to the pharmacy. 

## 2021-12-27 NOTE — Telephone Encounter (Signed)
*  STAT* If patient is at the pharmacy, call can be transferred to refill team.   1. Which medications need to be refilled? (please list name of each medication and dose if known) isosorbide dinitrate (ISORDIL) 10 MG tablet   2. Which pharmacy/location (including street and city if local pharmacy) is medication to be sent to?  [place] [address]    3. Do they need a 30 day or 90 day supply? [place]

## 2021-12-28 ENCOUNTER — Encounter: Payer: Self-pay | Admitting: Adult Health

## 2021-12-28 NOTE — Telephone Encounter (Signed)
ATC patient regarding setting up a HRCT scan.  LVM to return call.  When she returns call, we need to let her know that Tammy wants her to have HRCT in the next few weeks to evaluate her lungs.

## 2021-12-29 ENCOUNTER — Telehealth: Payer: Self-pay | Admitting: Family Medicine

## 2021-12-29 NOTE — Telephone Encounter (Signed)
[facility] is needing to Verify if pt has diabetes and any chronic coronary type of diease. Fax# [phone]    she is also sending over a form to be filled out.  [facility]'s phone # [phone]

## 2022-01-03 DIAGNOSIS — E119 Type 2 diabetes mellitus without complications: Secondary | ICD-10-CM | POA: Diagnosis not present

## 2022-01-05 NOTE — Telephone Encounter (Signed)
Patient returned call and is scheduled for PFT and OV on 01/20/2022.  Her HRCT is scheduled for 01/27/2022.  Nothing further needed.

## 2022-01-06 NOTE — Telephone Encounter (Signed)
Spoke with Lovena Le who verbally understood we have not received forms per Lovena Le she will re fax them over.

## 2022-01-09 ENCOUNTER — Encounter: Payer: Self-pay | Admitting: Cardiology

## 2022-01-09 DIAGNOSIS — I272 Pulmonary hypertension, unspecified: Secondary | ICD-10-CM | POA: Insufficient documentation

## 2022-01-09 NOTE — Assessment & Plan Note (Signed)
Right heart cath after diuresis revealed PA P of 2800 mercury.  PCWP 13 mmHg.  Indicating WHO Class III baseline pulm hypertension that can be exacerbated by diastolic HFpEF.  Likely related to ILD, obesity, OHS/OSA. Marland Kitchen  Defer management of ILD to pulmonary medicine.   CPAP with oxygen, weight loss after reduction.

## 2022-01-09 NOTE — Assessment & Plan Note (Addendum)
Have not seen labs checked in a long time.  Her last labs were from April 2022 and LDL looks great.  Labs had been followed by PCP.  Should be due for follow-up check along with A1c etc.  Back on Jardiance appropriately based on renal insufficiency.  Not on other medications.  Last A1c was 6.3.  If elevated above 6.5 in follow-up, would consider GLP-1 agonist.

## 2022-01-09 NOTE — Assessment & Plan Note (Signed)
Please lose weight.  We talked about dietary modification.  Her A1c was 6.3 so she is not quite in the window where we could consider Mounjaro or Ozempic, however if the A1c does go above 6.5, then I would recommend initiating GLP-1 agonist.  She understands she needs to lose weight.  Stressed importance of active exercise and dietary adjustment.

## 2022-01-09 NOTE — Assessment & Plan Note (Signed)
Followed by pulmonary medicine.  Contributing to dyspnea and WHO Class III Pulmonary Hypertension

## 2022-01-09 NOTE — Assessment & Plan Note (Signed)
Very low level moderate AAS on echo.  Plan will be recheck in 2 years (summer of 2025).  Does not need be checked annually.

## 2022-01-09 NOTE — Assessment & Plan Note (Signed)
Pressure also seems relatively stable on current meds.  As noted in CHF section, would like to try to convert from hydralazine-Isordil back to ARB.  Have asked that she discuss with her nephrologist.  If in agreement, would asked that they please go and make that change.

## 2022-01-09 NOTE — Assessment & Plan Note (Signed)
OSA with likely OHS.  Continue CPAP at nighttime oxygen.  Combination of ILD and OSA/obesity contributing to dyspnea seen on CPX and pulmonary pretension on 2D right heart cath.

## 2022-01-09 NOTE — Assessment & Plan Note (Signed)
Right heart cath really did not show significant diastolic heart failure with normal LVEDP and PCWP.   CPX actually indicated restrictive lung physiology with the more prominent reason for dyspnea being obesity with OHS. Continue blood pressure control prior to the reduction and rate control.  On carvedilol 25 mg twice daily (with heart rate 64 bpm, probably would not titrate further). Thankfully, she was started back on SGLT2 inhibitor-on CHF dose of Jardiance 10 mg daily On standing dose of furosemide 80 mg daily and not requiring additional doses.  Discussed importance of foot elevation. Continue CPAP with oxygen. He is on the combination hydralazine/Isordil 50 & 10 mg 3 times daily => ARB was stopped during hospital stay because of concern for renal function.   I have asked that she discuss with Dr. Royce Macadamia.  I think it would be probably beneficial for her to be on an ARB with a creatinine of 1.6. => Would like to replace hydralazine and Isordil with a stable ARB dose.

## 2022-01-10 ENCOUNTER — Other Ambulatory Visit: Payer: Self-pay | Admitting: Nurse Practitioner

## 2022-01-10 DIAGNOSIS — E785 Hyperlipidemia, unspecified: Secondary | ICD-10-CM

## 2022-01-12 ENCOUNTER — Other Ambulatory Visit: Payer: Self-pay | Admitting: Nurse Practitioner

## 2022-01-12 DIAGNOSIS — E785 Hyperlipidemia, unspecified: Secondary | ICD-10-CM

## 2022-01-13 ENCOUNTER — Ambulatory Visit (INDEPENDENT_AMBULATORY_CARE_PROVIDER_SITE_OTHER): Payer: No Typology Code available for payment source | Admitting: Family Medicine

## 2022-01-13 ENCOUNTER — Inpatient Hospital Stay: Payer: No Typology Code available for payment source | Admitting: Hematology and Oncology

## 2022-01-13 ENCOUNTER — Telehealth: Payer: Self-pay

## 2022-01-13 ENCOUNTER — Encounter: Payer: Self-pay | Admitting: Family Medicine

## 2022-01-13 ENCOUNTER — Inpatient Hospital Stay: Payer: No Typology Code available for payment source

## 2022-01-13 VITALS — BP 146/70 | HR 61 | Temp 97.4°F | Ht 68.0 in | Wt 337.4 lb

## 2022-01-13 DIAGNOSIS — E1169 Type 2 diabetes mellitus with other specified complication: Secondary | ICD-10-CM | POA: Diagnosis not present

## 2022-01-13 DIAGNOSIS — E1122 Type 2 diabetes mellitus with diabetic chronic kidney disease: Secondary | ICD-10-CM

## 2022-01-13 DIAGNOSIS — E785 Hyperlipidemia, unspecified: Secondary | ICD-10-CM | POA: Diagnosis not present

## 2022-01-13 DIAGNOSIS — N184 Chronic kidney disease, stage 4 (severe): Secondary | ICD-10-CM | POA: Diagnosis not present

## 2022-01-13 MED ORDER — ATORVASTATIN CALCIUM 20 MG PO TABS: 20.0000 mg | ORAL_TABLET | Freq: Every day | ORAL | 3 refills | Status: DC

## 2022-01-13 NOTE — Progress Notes (Signed)
Established Patient Office Visit   Subjective:  Patient ID: Stacey Page, female    DOB: Apr 23, 1950  Age: 71 y.o. MRN: [id number]  Chief Complaint  Patient presents with   Follow-up    6 month follow up on diabetes. Not sure if cholesterol meds should be refilled. Patient not fasting.     HPI Encounter Diagnoses  Name Primary?   Type 2 diabetes mellitus with stage 4 chronic kidney disease, without long-term current use of insulin (HCC) Yes   Hyperlipidemia associated with type 2 diabetes mellitus (HCC)    CKD (chronic kidney disease) stage 4, GFR 15-29 ml/min (HCC)    For follow-up of type 2 diabetes and cholesterol.  Continues atorvastatin 20 mg daily for cholesterol.  Continues Jardiance 10 mg for CKD and diabetes.  CKD has been stable   Review of Systems  Constitutional: Negative.   HENT: Negative.    Eyes:  Negative for blurred vision, discharge and redness.  Respiratory: Negative.    Cardiovascular: Negative.   Gastrointestinal:  Negative for abdominal pain.  Genitourinary: Negative.   Musculoskeletal: Negative.  Negative for myalgias.  Skin:  Negative for rash.  Neurological:  Negative for tingling, loss of consciousness and weakness.  Endo/Heme/Allergies:  Negative for polydipsia.     Current Outpatient Medications:    albuterol (VENTOLIN HFA) 108 (90 Base) MCG/ACT inhaler, INHALE 1-2 PUFFS into THE lungs EVERY 6 HOURS AS NEEDED FOR WHEEZING AND/OR SHORTNESS OF BREATH, Disp: 8.5 g, Rfl: 3   aspirin 81 MG tablet, Take 1 tablet (81 mg total) by mouth daily with breakfast., Disp: 90 tablet, Rfl: 0   atorvastatin (LIPITOR) 20 MG tablet, Take 1 tablet (20 mg total) by mouth daily., Disp: 90 tablet, Rfl: 3   Blood Glucose Monitoring Suppl ([place]) w/Device KIT, 1 kit by Does not apply route daily as needed., Disp: 1 kit, Rfl: 0   carvedilol (COREG) 25 MG tablet, TAKE ONE TABLET BY MOUTH TWICE DAILY, Disp: 180 tablet, Rfl: 3   cholecalciferol  (VITAMIN D3) 25 MCG (1000 UNIT) tablet, Take 2,000 Units by mouth daily., Disp: , Rfl:    Continuous Blood Gluc Sensor (FREESTYLE LIBRE 14 DAY SENSOR) MISC, Place one sensor on the skin every 14 days to monitor blood sugars continuously as directed., Disp: 6 each, Rfl: 3   Docusate Sodium 100 MG capsule, Take 100 mg by mouth 2 (two) times daily., Disp: , Rfl:    ferrous sulfate 325 (65 FE) MG tablet, Take 325 mg by mouth 2 (two) times daily with a meal., Disp: , Rfl:    fluticasone (FLONASE) 50 MCG/ACT nasal spray, instill ONE SPRAY in each nostril daily (Patient taking differently: Place 1 spray into both nostrils daily.), Disp: 16 g, Rfl: 3   furosemide (LASIX) 80 MG tablet, Take 1 tablet (80 mg total) by mouth daily., Disp: 30 tablet, Rfl: 6   gabapentin (NEURONTIN) 100 MG capsule, TAKE ONE CAPSULE BY MOUTH TWICE DAILY, Disp: 180 capsule, Rfl: 1   glucose blood (ONETOUCH VERIO) test strip, Use to monitor blood sugar twice daily as instructed, Disp: 200 each, Rfl: 3   hydrALAZINE (APRESOLINE) 50 MG tablet, Take 1 tablet (50 mg total) by mouth every 8 (eight) hours., Disp: 90 tablet, Rfl: 3   isosorbide dinitrate (ISORDIL) 10 MG tablet, Take 1 tablet (10 mg total) by mouth 3 (three) times daily., Disp: 270 tablet, Rfl: 0   JARDIANCE 10 MG TABS tablet, TAKE ONE TABLET BY MOUTH ONCE DAILY BEFORE BREAKFAST,  Disp: 30 tablet, Rfl: 2   montelukast (SINGULAIR) 10 MG tablet, TAKE ONE TABLET BY MOUTH EVERYDAY AT BEDTIME, Disp: 90 tablet, Rfl: 3   OneTouch Delica Lancets 68E MISC, Use to monitor blood sugars once daily as directed, Disp: 100 each, Rfl: 11   pantoprazole (PROTONIX) 40 MG tablet, Take 1 tablet (40 mg total) by mouth daily., Disp: 90 tablet, Rfl: 1   Objective:     BP (!) 144/72 (BP Location: Right Arm, Patient Position: Sitting, Cuff Size: Large)   Pulse 61   Temp (!) 97.4 F (36.3 C) (Temporal)   Ht _0  (1.727 m)   Wt (!) 337 lb 6.4 oz (153 kg)   SpO2 91% Comment: with out oxygen   BMI 51.30 kg/m    Physical Exam Constitutional:      General: She is not in acute distress.    Appearance: Normal appearance. She is not ill-appearing, toxic-appearing or diaphoretic.  HENT:     Head: Normocephalic and atraumatic.     Right Ear: External ear normal.     Left Ear: External ear normal.  Eyes:     General: No scleral icterus.       Right eye: No discharge.        Left eye: No discharge.     Extraocular Movements: Extraocular movements intact.     Conjunctiva/sclera: Conjunctivae normal.  Cardiovascular:     Rate and Rhythm: Normal rate and regular rhythm.     Pulses:          Dorsalis pedis pulses are 2+ on the right side and 2+ on the left side.       Posterior tibial pulses are 1+ on the right side and 1+ on the left side.  Pulmonary:     Effort: Pulmonary effort is normal. No respiratory distress.     Breath sounds: Normal breath sounds.  Abdominal:     General: Bowel sounds are normal.  Skin:    General: Skin is warm and dry.  Neurological:     Mental Status: She is alert and oriented to person, place, and time.  Psychiatric:        Mood and Affect: Mood normal.        Behavior: Behavior normal.    Diabetic Foot Exam - Simple   Simple Foot Form Diabetic Foot exam was performed with the following findings: Yes 01/13/2022  1:33 PM  Visual Inspection See comments: Yes Sensation Testing Intact to touch and monofilament testing bilaterally: Yes Pulse Check Posterior Tibialis and Dorsalis pulse intact bilaterally: Yes Comments Feet are pes planus bilaterally.  There are no lesions or ulcerations.       No results found for any visits on 01/13/22.    The ASCVD Risk score (Arnett DK, et al., 2019) failed to calculate for the following reasons:   The valid total cholesterol range is 130 to 320 mg/dL    Assessment & Plan:   Type 2 diabetes mellitus with stage 4 chronic kidney disease, without long-term current use of insulin (HCC) -     Basic  metabolic panel; Future  Hyperlipidemia associated with type 2 diabetes mellitus ([place]) -     Lipid panel; Future -     Atorvastatin Calcium; Take 1 tablet (20 mg total) by mouth daily.  Dispense: 90 tablet; Refill: 3  CKD (chronic kidney disease) stage 4, GFR 15-29 ml/min (HCC) -     Basic metabolic panel; Future    Return in about 6 months (  around 07/15/2022), or if symptoms worsen or fail to improve.   Will return fasting for above ordered blood work.  Continue current medications as above. [name], MD

## 2022-01-13 NOTE — Progress Notes (Signed)
Chronic Care Management Pharmacy Assistant   Name: Stacey Page  MRN: [id number] DOB: Jan 16, 1951  Reason for Encounter: Medication Review/Medication Coordination Call.  Recent office visits:  01/13/2022 Dr. Ethelene Hal MD (PCP) Start Atorvastatin 20 mg Daily, Return in about 6 months  12/23/2021 Dr. Ethelene Hal MD (PCP) restart hydralazine and isorbide dinitrate bid on monday per thursday. Then take both 3 times daily.  Recent consult visits:  12/24/2021 Rexene Edison NP (Pulmonology) No Medication Changes noted 12/20/2021 Jake Bathe FNP (Weight Management) No Medication Changes noted  Hospital visits:  None in previous 6 months  Medications: Outpatient Encounter Medications as of 01/13/2022  Medication Sig   albuterol (VENTOLIN HFA) 108 (90 Base) MCG/ACT inhaler INHALE 1-2 PUFFS into THE lungs EVERY 6 HOURS AS NEEDED FOR WHEEZING AND/OR SHORTNESS OF BREATH   aspirin 81 MG tablet Take 1 tablet (81 mg total) by mouth daily with breakfast.   Blood Glucose Monitoring Suppl ([place]) w/Device KIT 1 kit by Does not apply route daily as needed.   carvedilol (COREG) 25 MG tablet TAKE ONE TABLET BY MOUTH TWICE DAILY   cholecalciferol (VITAMIN D3) 25 MCG (1000 UNIT) tablet Take 2,000 Units by mouth daily.   Continuous Blood Gluc Sensor (FREESTYLE LIBRE 14 DAY SENSOR) MISC Place one sensor on the skin every 14 days to monitor blood sugars continuously as directed.   Docusate Sodium 100 MG capsule Take 100 mg by mouth 2 (two) times daily.   ferrous sulfate 325 (65 FE) MG tablet Take 325 mg by mouth 2 (two) times daily with a meal.   fluticasone (FLONASE) 50 MCG/ACT nasal spray instill ONE SPRAY in each nostril daily (Patient taking differently: Place 1 spray into both nostrils daily.)   furosemide (LASIX) 80 MG tablet Take 1 tablet (80 mg total) by mouth daily.   gabapentin (NEURONTIN) 100 MG capsule TAKE ONE CAPSULE BY MOUTH TWICE DAILY   glucose blood (ONETOUCH VERIO) test  strip Use to monitor blood sugar twice daily as instructed   hydrALAZINE (APRESOLINE) 50 MG tablet Take 1 tablet (50 mg total) by mouth every 8 (eight) hours. (Patient not taking: Reported on 12/23/2021)   isosorbide dinitrate (ISORDIL) 10 MG tablet Take 1 tablet (10 mg total) by mouth 3 (three) times daily.   JARDIANCE 10 MG TABS tablet TAKE ONE TABLET BY MOUTH ONCE DAILY BEFORE BREAKFAST   montelukast (SINGULAIR) 10 MG tablet TAKE ONE TABLET BY MOUTH EVERYDAY AT BEDTIME   OneTouch Delica Lancets 97W MISC Use to monitor blood sugars once daily as directed   pantoprazole (PROTONIX) 40 MG tablet Take 1 tablet (40 mg total) by mouth daily.   No facility-administered encounter medications on file as of 01/13/2022.    Care Gaps: Dtap Vaccine   Star Rating Drugs: None ID  Reviewed chart for medication changes ahead of medication coordination call.  BP Readings from Last 3 Encounters:  12/24/21 124/60  12/23/21 120/64  12/22/21 (!) 116/39    Lab Results  Component Value Date   HGBA1C 6.3 (H) 11/12/2021     Patient obtains medications through Vials  90 Days   Last adherence delivery included:  Montelukast 10 mg one tablet daily  Atorvastatin 20 mg one tablet daily Carvedilol 25 mg one tablet twice daily Gabapentin 100 mg one capsule 2 times daily Flonase 50 MCG/ACT nasal spray  Hydralazine 50 mg one tablet 3 times daily  Isosorbide dinitrate 20 mg in the morning and evening with Hydralazine 50 mg dose Pantoprazole 40 mg  1 tablet daily  Freestyle Libre 14 day sensor -  Place one sensor on the skin every 14 days  Jardiance 10 mg-  1 tablet daily before breakfast-   Patient declined medication  last month: Lisinopril 20 mg one tablet daily - Patient states medication Was stop Furosemide 40 mg one tablet four times per week (Adequate supply)  Aspirin 81 mg one tablet daily - OTC  Albuterol 108 MCG/ACT inhaler- PRN (Adequate supply)  Test Strips- Adequate Supply Lancets - adequate  Supply  Patient is due for next adherence delivery on: 01/25/2022. Called patient and reviewed medications and coordinated delivery.  This delivery to include:  Atorvastatin 20 mg one tablet daily  Patient declined the following medications:Patient states she does not need anything else refill at this time, and will reach out to me if she does.  Montelukast 10 mg one tablet daily(Adequate supply)  Carvedilol 25 mg one tablet twice daily(Adequate supply)  Gabapentin 100 mg one capsule 2 times daily(Adequate supply)  Flonase 50 MCG/ACT nasal spray (Adequate supply)  Hydralazine 50 mg one tablet 3 times daily (Adequate supply)  Isosorbide dinitrate 20 mg in the morning and evening with Hydralazine 50 mg dose(Adequate supply)  Pantoprazole 40 mg 1 tablet daily (Adequate supply)  Freestyle Libre 14 day sensor -  Place one sensor on the skin every 14 days (Adequate supply)  Jardiance 10 mg-  1 tablet daily before breakfast-  (Adequate supply)  Furosemide 40 mg one tablet four times per week (Adequate supply)  Aspirin 81 mg one tablet daily - OTC  Albuterol 108 MCG/ACT inhaler- PRN (Adequate supply)  Test Strips- Adequate Supply Lancets - adequate Supply  Patient needs refills for None ID.  Confirmed delivery date of 01/14/2022 ([facility]) , advised patient that pharmacy will contact them the morning of delivery.  Patient may switch pharmacy in the begin of the year as she may switch insurance.Patient will let me or the pharmacy know.  [facility] Pharmacist Assistant [phone]

## 2022-01-14 ENCOUNTER — Other Ambulatory Visit (INDEPENDENT_AMBULATORY_CARE_PROVIDER_SITE_OTHER): Payer: No Typology Code available for payment source

## 2022-01-14 DIAGNOSIS — E1122 Type 2 diabetes mellitus with diabetic chronic kidney disease: Secondary | ICD-10-CM

## 2022-01-14 DIAGNOSIS — N184 Chronic kidney disease, stage 4 (severe): Secondary | ICD-10-CM | POA: Diagnosis not present

## 2022-01-14 DIAGNOSIS — E785 Hyperlipidemia, unspecified: Secondary | ICD-10-CM

## 2022-01-14 DIAGNOSIS — E1169 Type 2 diabetes mellitus with other specified complication: Secondary | ICD-10-CM

## 2022-01-14 LAB — LIPID PANEL
Cholesterol: 144 mg/dL (ref 0–200)
HDL: 57.1 mg/dL (ref >39.00)
LDL Cholesterol: 73 mg/dL (ref 0–99)
NonHDL: 86.48
Total CHOL/HDL Ratio: 3
Triglycerides: 66 mg/dL (ref 0.0–149.0)
VLDL: 13.2 mg/dL (ref 0.0–40.0)

## 2022-01-14 LAB — BASIC METABOLIC PANEL
BUN: 30 mg/dL — ABNORMAL HIGH (ref 6–23)
CO2: 36 mEq/L — ABNORMAL HIGH (ref 19–32)
Calcium: 9.7 mg/dL (ref 8.4–10.5)
Chloride: 98 mEq/L (ref 96–112)
Creatinine, Ser: 2.03 mg/dL — ABNORMAL HIGH (ref 0.40–1.20)
GFR: 24.47 mL/min — ABNORMAL LOW (ref >60.00)
Glucose, Bld: 110 mg/dL — ABNORMAL HIGH (ref 70–99)
Potassium: 3.7 mEq/L (ref 3.5–5.1)
Sodium: 143 mEq/L (ref 135–145)

## 2022-01-17 ENCOUNTER — Ambulatory Visit (INDEPENDENT_AMBULATORY_CARE_PROVIDER_SITE_OTHER): Payer: No Typology Code available for payment source | Admitting: Physician Assistant

## 2022-01-17 ENCOUNTER — Ambulatory Visit: Payer: No Typology Code available for payment source | Admitting: Podiatry

## 2022-01-17 ENCOUNTER — Encounter: Payer: Self-pay | Admitting: Podiatry

## 2022-01-17 VITALS — BP 145/55

## 2022-01-17 DIAGNOSIS — B351 Tinea unguium: Secondary | ICD-10-CM

## 2022-01-17 DIAGNOSIS — E1142 Type 2 diabetes mellitus with diabetic polyneuropathy: Secondary | ICD-10-CM | POA: Diagnosis not present

## 2022-01-17 DIAGNOSIS — M79675 Pain in left toe(s): Secondary | ICD-10-CM | POA: Diagnosis not present

## 2022-01-17 DIAGNOSIS — M79674 Pain in right toe(s): Secondary | ICD-10-CM | POA: Diagnosis not present

## 2022-01-18 ENCOUNTER — Ambulatory Visit (INDEPENDENT_AMBULATORY_CARE_PROVIDER_SITE_OTHER): Payer: No Typology Code available for payment source | Admitting: Physician Assistant

## 2022-01-18 ENCOUNTER — Encounter (INDEPENDENT_AMBULATORY_CARE_PROVIDER_SITE_OTHER): Payer: Self-pay | Admitting: Physician Assistant

## 2022-01-18 VITALS — BP 147/71 | HR 62 | Temp 97.9°F | Ht 68.0 in | Wt 332.0 lb

## 2022-01-18 DIAGNOSIS — I5032 Chronic diastolic (congestive) heart failure: Secondary | ICD-10-CM

## 2022-01-18 DIAGNOSIS — N184 Chronic kidney disease, stage 4 (severe): Secondary | ICD-10-CM | POA: Diagnosis not present

## 2022-01-18 DIAGNOSIS — E1122 Type 2 diabetes mellitus with diabetic chronic kidney disease: Secondary | ICD-10-CM

## 2022-01-18 DIAGNOSIS — Z7984 Long term (current) use of oral hypoglycemic drugs: Secondary | ICD-10-CM | POA: Diagnosis not present

## 2022-01-18 DIAGNOSIS — E669 Obesity, unspecified: Secondary | ICD-10-CM

## 2022-01-18 DIAGNOSIS — Z6841 Body Mass Index (BMI) 40.0 and over, adult: Secondary | ICD-10-CM

## 2022-01-19 ENCOUNTER — Ambulatory Visit: Payer: Self-pay

## 2022-01-19 NOTE — Patient Instructions (Signed)
Visit Information  Thank you for taking time to visit with me today. Please don't hesitate to contact me if I can be of assistance to you.   Following are the goals we discussed today:   Goals Addressed             This Visit's Progress    I want to monitor and manage my CHF       Care Coordination Interventions: Provided education on low sodium diet Advised patient to weigh each morning after emptying bladder Discussed importance of daily weight and advised patient to weigh and record daily Reviewed role of diuretics in prevention of fluid overload and management of heart failure; Discussed the importance of keeping all appointments with provider Active listening / Reflection utilized  Emotional Support Provided Problem [place] strategies reviewed Discussed diet         Our next appointment is by telephone on 02/22/22 at 1130 am  Please call the care guide team at [phone] if you need to cancel or reschedule your appointment.   If you are experiencing a Mental Health or [place] or need someone to talk to, please call 1-800-273-TALK (toll free, 24 hour hotline)  Patient verbalizes understanding of instructions and care plan provided today and agrees to view in [place]. Active MyChart status and patient understanding of how to access instructions and care plan via MyChart confirmed with patient.      Lazaro Arms RN, BSN, [facility]   Phone: [phone]

## 2022-01-19 NOTE — Patient Outreach (Signed)
  Care Coordination   Follow Up Visit Note   01/19/2022 Name: Stacey Page MRN: [id number] DOB: 05/18/1950  Stacey Page is a 71 y.o. year old female who sees [name], MD for primary care. I spoke with  [name] by phone today.  What matters to the patients health and wellness today?  I had a conversation with Mrs. Stann Mainland, and I'm happy to inform you that she is doing well. She is keeping excellent records of her blood sugar levels, weight, oxygen levels, and blood pressure. Her oxygen saturation ranged from 89-92 between December 8th and December 13th. Her finger-stick blood sugar levels are between 113 and 131, while Libre's levels are between 94 and 116. Her weight ranges from 333-336, and her blood pressure ranges from 119/68-150/72. She is conscious of her food choices and is monitoring her salt intake. I praised her for the excellent job she has been doing in taking control of her health, and I encouraged her to continue with the same spirit. She reported that she has not experienced chest pain, shortness of breath, or swelling.    Goals Addressed             This Visit's Progress    I want to monitor and manage my CHF       Care Coordination Interventions: Provided education on low sodium diet Advised patient to weigh each morning after emptying bladder Discussed importance of daily weight and advised patient to weigh and record daily Reviewed role of diuretics in prevention of fluid overload and management of heart failure; Discussed the importance of keeping all appointments with provider Active listening / Reflection utilized  Emotional Support Provided Problem [place] strategies reviewed Discussed diet         SDOH assessments and interventions completed:  No     Care Coordination Interventions:  Yes, provided   Follow up plan: Follow up call scheduled for 02/22/22 1130 am    Encounter Outcome:  Pt. Visit Completed    Lazaro Arms RN, BSN, [facility]   Phone: [phone]

## 2022-01-20 ENCOUNTER — Ambulatory Visit (INDEPENDENT_AMBULATORY_CARE_PROVIDER_SITE_OTHER): Payer: No Typology Code available for payment source | Admitting: Adult Health

## 2022-01-20 ENCOUNTER — Encounter: Payer: Self-pay | Admitting: Adult Health

## 2022-01-20 ENCOUNTER — Telehealth: Payer: Self-pay | Admitting: *Deleted

## 2022-01-20 ENCOUNTER — Ambulatory Visit (INDEPENDENT_AMBULATORY_CARE_PROVIDER_SITE_OTHER): Payer: No Typology Code available for payment source | Admitting: Pulmonary Disease

## 2022-01-20 VITALS — BP 110/60 | HR 68 | Temp 98.1°F | Ht 68.0 in | Wt 336.6 lb

## 2022-01-20 DIAGNOSIS — J849 Interstitial pulmonary disease, unspecified: Secondary | ICD-10-CM

## 2022-01-20 DIAGNOSIS — I272 Pulmonary hypertension, unspecified: Secondary | ICD-10-CM | POA: Diagnosis not present

## 2022-01-20 DIAGNOSIS — J4521 Mild intermittent asthma with (acute) exacerbation: Secondary | ICD-10-CM

## 2022-01-20 DIAGNOSIS — J9611 Chronic respiratory failure with hypoxia: Secondary | ICD-10-CM

## 2022-01-20 DIAGNOSIS — I5032 Chronic diastolic (congestive) heart failure: Secondary | ICD-10-CM | POA: Diagnosis not present

## 2022-01-20 LAB — PULMONARY FUNCTION TEST
DL/VA % pred: 91 %
DL/VA: 3.7 ml/min/mmHg/L
DLCO cor % pred: 62 %
DLCO cor: 13.54 ml/min/mmHg
DLCO unc % pred: 62 %
DLCO unc: 13.54 ml/min/mmHg
FEF 25-75 Post: 1.35 L/sec
FEF 25-75 Pre: 2.28 L/sec
FEF2575-%Change-Post: -40 %
FEF2575-%Pred-Post: 64 %
FEF2575-%Pred-Pre: 109 %
FEV1-%Change-Post: -4 %
FEV1-%Pred-Post: 59 %
FEV1-%Pred-Pre: 62 %
FEV1-Post: 1.52 L
FEV1-Pre: 1.6 L
FEV1FVC-%Change-Post: -12 %
FEV1FVC-%Pred-Pre: 112 %
FEV6-%Change-Post: 8 %
FEV6-%Pred-Post: 63 %
FEV6-%Pred-Pre: 58 %
FEV6-Post: 2.04 L
FEV6-Pre: 1.87 L
FEV6FVC-%Pred-Post: 104 %
FEV6FVC-%Pred-Pre: 104 %
FVC-%Change-Post: 8 %
FVC-%Pred-Post: 60 %
FVC-%Pred-Pre: 55 %
FVC-Post: 2.04 L
FVC-Pre: 1.87 L
Post FEV1/FVC ratio: 75 %
Post FEV6/FVC ratio: 100 %
Pre FEV1/FVC ratio: 85 %
Pre FEV6/FVC Ratio: 100 %
RV % pred: 71 %
RV: 1.67 L
TLC % pred: 69 %
TLC: 3.83 L

## 2022-01-20 NOTE — Progress Notes (Signed)
PFT done today. 

## 2022-01-20 NOTE — Patient Instructions (Addendum)
Continue on Oxygen 2l/m , goal is to keep Oxygen >88-90% Continue on CPAP At bedtime with Oxygen 2l/m  Activity as tolerated  Continue on Lasix  Albuterol inhaler As needed   Continue on Singulair and Flonase daily  HRCT Chest next week as planned.  Overnight oxygen test results when available  Follow up with .Dr. Halford Chessman in 4 weeks as planned and As needed  Please contact office for sooner follow up if symptoms do not improve or worsen or seek emergency care

## 2022-01-20 NOTE — Assessment & Plan Note (Signed)
High-res CT chest is pending.  PFTs show increased restrictive changes but DLCO is improved stable to improved. Will wait for CT chest results.  Continue on current regimen  Plan    Patient Instructions  Continue on Oxygen 2l/m , goal is to keep Oxygen >88-90% Continue on CPAP At bedtime with Oxygen 2l/m  Activity as tolerated  Continue on Lasix  Albuterol inhaler As needed   Continue on Singulair and Flonase daily  HRCT Chest next week as planned.  Overnight oxygen test results when available  Follow up with .Dr. Halford Chessman in 4 weeks as planned and As needed  Please contact office for sooner follow up if symptoms do not improve or worsen or seek emergency care  '

## 2022-01-20 NOTE — Progress Notes (Signed)
Reviewed and agree with assessment/plan.   Chesley Mires, MD [facility]/Critical Care 01/20/2022, 5:14 PM Pager:  [phone]

## 2022-01-20 NOTE — Assessment & Plan Note (Signed)
Mild pulmonary hypertension WHO class III likely related to ILD and OHS/OSA. Continue on oxygen and diuretics.  Continue on nocturnal CPAP.  Plan  Patient Instructions  Continue on Oxygen 2l/m , goal is to keep Oxygen >88-90% Continue on CPAP At bedtime with Oxygen 2l/m  Activity as tolerated  Continue on Lasix  Albuterol inhaler As needed   Continue on Singulair and Flonase daily  HRCT Chest next week as planned.  Overnight oxygen test results when available  Follow up with .Dr. Halford Chessman in 4 weeks as planned and As needed  Please contact office for sooner follow up if symptoms do not improve or worsen or seek emergency care

## 2022-01-20 NOTE — Assessment & Plan Note (Signed)
Appears euvolemic on exam.  Continue current regimen continue follow-up with cardiology

## 2022-01-20 NOTE — Assessment & Plan Note (Signed)
Continue on oxygen to maintain O2 saturations greater than 88 to 9%.  [place] on CPAP is pending

## 2022-01-20 NOTE — Progress Notes (Signed)
_0  ID: Stacey Page, female    DOB: 26-Aug-1950, 71 y.o.   MRN: [id number]  Chief Complaint  Patient presents with   Follow-up    Referring provider: [name]  HPI: 71 year old female former smoker followed for asthma, sleep apnea, mild pulmonary hypertension WHO group 3 , history of interstitial lung disease secondary to MAI) VATS biopsy May 2016 bronchiolitis with scattered nonnecrotizing granulomatous inflammation with atypical Mycobacterium) Medical history significant for chronic diastolic heart failure, aortic valve stenosis and chronic kidney disease and diabetes.  TEST/EVENTS :  Echo 01/28/14 >> severe LVH, EF 65 to 77%, grade 2 diastolic dysfx, mild AS, mild/mod LA. HST 02/12/14 >> AHI 59.3, SaO2 low 67% Auto CPAP 03/07/14 to 03/09/14 >> used on 3 of 3 nights with average 8 hrs and 8 min.  Average AHI is 3.3 with median CPAP 12 cm H2O and 95 th percentile CPAP 14 cm H20. PFT 03/12/14 >> FEV1 2.05 (88%), FEV1% 87, TLC 4.14 (75%), DLCO 52%, no BD HRCT chest 03/20/14 >> multiple small nodules, patchy GGO, mild BTX, air trapping   Coronary CT chest October 06, 2017 scattered groundglass opacities without significant change  01/20/2022 Follow up : ILD, OSA , Asthma  Patient presents for a 1 month follow-up.  Patient has underlying sleep apnea.  She is on nocturnal CPAP.  Patient says she wears her CPAP every single night.  Cannot sleep without it.  CPAP download shows excellent compliance with 100% usage.  Daily average usage at 6 hours.  Patient is on auto CPAP 7 to 11 cm H2O.  AHI 4.6/hour. Patient feels that she benefits from CPAP.  Her overnight oximetry test on CPAP is pending.  Patient has a history of interstitial lung changes on CT chest.  She had a VATS biopsy in 2016 with bronchiolitis with scattered nonnecrotizing granulomatous inflammation with atypical Mycobacterium.  Patient was set up for a follow-up CT chest that is pending for next week.  PFTs  done today show moderate restriction with FEV1 at 62%, ratio 85, FVC 55%.  DLCO stable at 62%.  Patient says overall feels that her breathing is actually doing a little bit better.  She has less lower extremity edema.  Denies any significant cough.  She does get short of breath with activities but feels like her breathing has been better since last visit.  Patient was hospitalized in September for decompensated heart failure and hypercarbic respiratory failure.  She was aggressively diuresed.  S she remains on Lasix..  Feels that her breathing has been better on this regimen.  She remains on oxygen 2 L.  Was able to get a POC.    No Known Allergies  Immunization History  Administered Date(s) Administered   Fluad Quad(high Dose 65+) 11/28/2018, 01/29/2020   Influenza Split 11/21/2016   Influenza, High Dose Seasonal PF 12/02/2021   Influenza,inj,Quad PF,6+ Mos 01/12/2015, 11/19/2015   PFIZER(Purple Top)SARS-COV-2 Vaccination 03/31/2019, 04/23/2019, 03/21/2020   Pneumococcal Conjugate-13 01/19/2017   Pneumococcal Polysaccharide-23 01/12/2015, 05/14/2020   Zoster Recombinat (Shingrix) 07/14/2021, 12/02/2021    Past Medical History:  Diagnosis Date   Anxiety    doesn't take any meds   Arthritis of right knee 03/14/2016   Asthma    Back pain    Chronic diastolic CHF (congestive heart failure) ([place])    HF with Preserved EF (60-65%) - Grade II Diastolic Dysfunction (Hypertensive Heart Disease). takes Furosemide daily   Depression    doesn't take meds   Diabetes ([place])  takes Januvia daily   Eczema    uses cream as needed   GERD (gastroesophageal reflux disease)    History of bronchitis as a child    HTN (hypertension)    Insomnia    Moderate aortic stenosis by prior echocardiogram 04/2017   10/15/2021: Echo-ao calcification with moderate AS, MG 23 mmHg   Mycobacterium avium-intracellulare infection ([place]) 2016   OA (osteoarthritis)    Obese    OSA (obstructive sleep apnea)  02/25/2014   wears CPAP at night   Peripheral neuropathy    takes Gabapentin as needed   Pneumonia    hx of-2010   Seasonal allergies    uses Flonase daily   Sleep apnea     Tobacco History: Social History   Tobacco Use  Smoking Status Former   Packs/day: 0.25   Years: 15.00   Total pack years: 3.75   Types: Cigarettes   Quit date: 02/08/1980   Years since quitting: 41.9  Smokeless Tobacco Former   Counseling given: Not Answered   Outpatient Medications Prior to Visit  Medication Sig Dispense Refill   albuterol (VENTOLIN HFA) 108 (90 Base) MCG/ACT inhaler INHALE 1-2 PUFFS into THE lungs EVERY 6 HOURS AS NEEDED FOR WHEEZING AND/OR SHORTNESS OF BREATH 8.5 g 3   aspirin 81 MG tablet Take 1 tablet (81 mg total) by mouth daily with breakfast. 90 tablet 0   atorvastatin (LIPITOR) 20 MG tablet Take 1 tablet (20 mg total) by mouth daily. 90 tablet 3   Blood Glucose Monitoring Suppl ([place]) w/Device KIT 1 kit by Does not apply route daily as needed. 1 kit 0   carvedilol (COREG) 25 MG tablet TAKE ONE TABLET BY MOUTH TWICE DAILY 180 tablet 3   cholecalciferol (VITAMIN D3) 25 MCG (1000 UNIT) tablet Take 2,000 Units by mouth daily.     Continuous Blood Gluc Sensor (FREESTYLE LIBRE 14 DAY SENSOR) MISC Place one sensor on the skin every 14 days to monitor blood sugars continuously as directed. 6 each 3   Docusate Sodium 100 MG capsule Take 100 mg by mouth 2 (two) times daily.     ferrous sulfate 325 (65 FE) MG tablet Take 325 mg by mouth 2 (two) times daily with a meal.     fluticasone (FLONASE) 50 MCG/ACT nasal spray instill ONE SPRAY in each nostril daily (Patient taking differently: Place 1 spray into both nostrils daily.) 16 g 3   furosemide (LASIX) 80 MG tablet Take 1 tablet (80 mg total) by mouth daily. 30 tablet 6   gabapentin (NEURONTIN) 100 MG capsule TAKE ONE CAPSULE BY MOUTH TWICE DAILY 180 capsule 1   glucose blood (ONETOUCH VERIO) test strip Use to monitor  blood sugar twice daily as instructed 200 each 3   hydrALAZINE (APRESOLINE) 50 MG tablet Take 1 tablet (50 mg total) by mouth every 8 (eight) hours. 90 tablet 3   isosorbide dinitrate (ISORDIL) 10 MG tablet Take 1 tablet (10 mg total) by mouth 3 (three) times daily. 270 tablet 0   JARDIANCE 10 MG TABS tablet TAKE ONE TABLET BY MOUTH ONCE DAILY BEFORE BREAKFAST 30 tablet 2   montelukast (SINGULAIR) 10 MG tablet TAKE ONE TABLET BY MOUTH EVERYDAY AT BEDTIME 90 tablet 3   OneTouch Delica Lancets 56Y MISC Use to monitor blood sugars once daily as directed 100 each 11   pantoprazole (PROTONIX) 40 MG tablet Take 1 tablet (40 mg total) by mouth daily. 90 tablet 1   No facility-administered medications prior  to visit.     Review of Systems:   Constitutional:   No  weight loss, night sweats,  Fevers, chills,  +fatigue, or  lassitude.  HEENT:   No headaches,  Difficulty swallowing,  Tooth/dental problems, or  Sore throat,                No sneezing, itching, ear ache, nasal congestion, post nasal drip,   CV:  No chest pain,  Orthopnea, PND,  anasarca, dizziness, palpitations, syncope.   GI  No heartburn, indigestion, abdominal pain, nausea, vomiting, diarrhea, change in bowel habits, loss of appetite, bloody stools.   Resp: .  No chest wall deformity  Skin: no rash or lesions.  GU: no dysuria, change in color of urine, no urgency or frequency.  No flank pain, no hematuria   MS:  No joint pain or swelling.  No decreased range of motion.  No back pain.    Physical Exam  BP 110/60 (BP Location: Left Wrist, Patient Position: Sitting, Cuff Size: Normal)   Pulse 68   Temp 98.1 F (36.7 C) (Oral)   Ht _0  (1.727 m)   Wt (!) 336 lb 9.6 oz (152.7 kg)   SpO2 99%   BMI 51.18 kg/m   GEN: A/Ox3; pleasant , NAD, On O2    HEENT:  [place]/AT,  NOSE-clear, THROAT-clear, no lesions, no postnasal drip or exudate noted.   NECK:  Supple w/ fair ROM; no JVD; normal carotid impulses w/o bruits; no  thyromegaly or nodules palpated; no lymphadenopathy.    RESP  Clear  P & A; w/o, wheezes/ rales/ or rhonchi. no accessory muscle use, no dullness to percussion  CARD:  RRR, no m/r/g, tr periphera as well as a l edema, pulses intact, no cyanosis or clubbing.  GI:   Soft & nt; nml bowel sounds; no organomegaly or masses detected.   Musco: Warm bil, no deformities or joint swelling noted.   Neuro: alert, no focal deficits noted.    Skin: Warm, no lesions or rashes    Lab Results:  CBC    Imaging: No results found.       Latest Ref Rng & Units 01/20/2022    9:47 AM 03/12/2014    8:43 AM  PFT Results  FVC-Pre L 1.87  P 2.37   FVC-Predicted Pre % 55  P 80   FVC-Post L 2.04  P 2.35   FVC-Predicted Post % 60  P 79   Pre FEV1/FVC % % 85  P 87   Post FEV1/FCV % % 75  P 87   FEV1-Pre L 1.60  P 2.05   FEV1-Predicted Pre % 62  P 88   FEV1-Post L 1.52  P 2.05   DLCO uncorrected ml/min/mmHg 13.54  P 14.84   DLCO UNC% % 62  P 52   DLCO corrected ml/min/mmHg 13.54  P   DLCO COR %Predicted % 62  P   DLVA Predicted % 91  P 72   TLC L 3.83  P 4.14   TLC % Predicted % 69  P 75   RV % Predicted % 71  P 72     P Preliminary result    No results found for: "NITRICOXIDE"      Assessment & Plan:   ILD (interstitial lung disease) 2/2 MAI s/p med rxn High-res CT chest is pending.  PFTs show increased restrictive changes but DLCO is improved stable to improved. Will wait for CT chest results.  Continue on  current regimen  Plan    Patient Instructions  Continue on Oxygen 2l/m , goal is to keep Oxygen >88-90% Continue on CPAP At bedtime with Oxygen 2l/m  Activity as tolerated  Continue on Lasix  Albuterol inhaler As needed   Continue on Singulair and Flonase daily  HRCT Chest next week as planned.  Overnight oxygen test results when available  Follow up with .Dr. Halford Chessman in 4 weeks as planned and As needed  Please contact office for sooner follow up if symptoms do not improve  or worsen or seek emergency care  '   Mild pulmonary hypertension ([place]) Mild pulmonary hypertension WHO class III likely related to ILD and OHS/OSA. Continue on oxygen and diuretics.  Continue on nocturnal CPAP.  Plan  Patient Instructions  Continue on Oxygen 2l/m , goal is to keep Oxygen >88-90% Continue on CPAP At bedtime with Oxygen 2l/m  Activity as tolerated  Continue on Lasix  Albuterol inhaler As needed   Continue on Singulair and Flonase daily  HRCT Chest next week as planned.  Overnight oxygen test results when available  Follow up with .Dr. Halford Chessman in 4 weeks as planned and As needed  Please contact office for sooner follow up if symptoms do not improve or worsen or seek emergency care     CHF (congestive heart failure) ([place]) Appears euvolemic on exam.  Continue current regimen continue follow-up with cardiology  Chronic respiratory failure with hypoxia ([place]) Continue on oxygen to maintain O2 saturations greater than 88 to 9%.  Ono on CPAP is pending  Reactive airway disease Mild intermittent asthma continue on current regimen.  PFTs today show moderate restriction which may be secondary to body habitus plus or minus reactive airways.  Currently with no active coughing or wheezing.  Continue on current regimen  Plan  Patient Instructions  Continue on Oxygen 2l/m , goal is to keep Oxygen >88-90% Continue on CPAP At bedtime with Oxygen 2l/m  Activity as tolerated  Continue on Lasix  Albuterol inhaler As needed   Continue on Singulair and Flonase daily  HRCT Chest next week as planned.  Overnight oxygen test results when available  Follow up with .Dr. Halford Chessman in 4 weeks as planned and As needed  Please contact office for sooner follow up if symptoms do not improve or worsen or seek emergency care       Rexene Edison, NP 01/20/2022

## 2022-01-20 NOTE — Telephone Encounter (Signed)
Called and spoke with [facility] with [employer] regarding the ONO.  He states the test was done and should have had enough time to have been read.  He will contact Jasmine/Patricia to see who handles the ONO tests and have the results faxed to [phone].  Will await fax.

## 2022-01-20 NOTE — Assessment & Plan Note (Signed)
Mild intermittent asthma continue on current regimen.  PFTs today show moderate restriction which may be secondary to body habitus plus or minus reactive airways.  Currently with no active coughing or wheezing.  Continue on current regimen  Plan  Patient Instructions  Continue on Oxygen 2l/m , goal is to keep Oxygen >88-90% Continue on CPAP At bedtime with Oxygen 2l/m  Activity as tolerated  Continue on Lasix  Albuterol inhaler As needed   Continue on Singulair and Flonase daily  HRCT Chest next week as planned.  Overnight oxygen test results when available  Follow up with .Dr. Halford Chessman in 4 weeks as planned and As needed  Please contact office for sooner follow up if symptoms do not improve or worsen or seek emergency care

## 2022-01-22 NOTE — Progress Notes (Signed)
  Subjective:  Patient ID: Stacey Page, female    DOB: [birth year],  MRN: [id number]  Stacey Page presents to clinic today for at risk foot care with history of diabetic neuropathy and painful thick toenails that are difficult to trim. Pain interferes with ambulation. Aggravating factors include wearing enclosed shoe gear. Pain is relieved with periodic professional debridement.  Chief Complaint  Patient presents with   Nail Problem    DFC BS-[id number] A1C-6.1 PCP-[facility] PCP VST-01/13/2022   New problem(s): None.   PCP is [name], MD.  No Known Allergies  Review of Systems: Negative except as noted in the HPI.  Objective: No changes noted in today's physical examination. Vitals:   01/17/22 1637  BP: (!) 145/55   Stacey Page is a pleasant 71 y.o. female morbidly obese in NAD. AAO x 3.  Vascular Examination: CFT <3 seconds b/l. DP/PT pulses faintly palpable b/l. Skin temperature gradient warm to warm b/l. No ischemia or gangrene. No cyanosis or clubbing noted b/l. Pedal hair present.    Neurological Examination: Pt has subjective symptoms of neuropathy. Vibratory sensation intact b/l. Sensation grossly intact b/l with 10 gram monofilament. Vibratory sensation intact b/l.    Dermatological Examination: Pedal integument with normal turgor, texture and tone b/l LE. No open wounds b/l. No interdigital macerations b/l.   Toenails 1-5 b/l elongated, thickened, discolored with subungual debris. +Tenderness with dorsal palpation of nailplates.   Incurvated nailplate both borders of left hallux and both borders of right hallux.  Nail border hypertrophy absent. There is tenderness to palpation. Sign(s) of infection: no clinical signs of infection noted on examination today.Marland Kitchen  Resolved hyperkeratotic lesion(s) bilateral heels.     Musculoskeletal Examination: Muscle strength 5/5 to b/l LE. Hammertoe deformity noted 2-5 b/l. Pes planus deformity noted bilateral  LE.   Radiographs: None  Assessment/Plan: 1. Pain due to onychomycosis of toenails of both feet   2. Diabetic peripheral neuropathy associated with type 2 diabetes mellitus ([place])     No orders of the defined types were placed in this encounter.  -Patient's family member present. All questions/concerns addressed on today's visit. -Continue foot and shoe inspections daily. Monitor blood glucose per PCP/Endocrinologist's recommendations. -Continue supportive shoe gear daily. -Toenails 2-5 bilaterally debrided in length and girth without iatrogenic bleeding with sterile nail nipper and dremel.  -No invasive procedure(s) performed. Offending nail border debrided and curretaged bilateral great toes utilizing sterile nail nipper and currette. Border(s) cleansed with alcohol and triple antibiotic ointment applied. Patient/POA/Caregiver/Facility instructed to apply triple antibiotic ointment  to bilateral great toes once daily for 7 days. Call office if there are any concerns. -Patient/POA to call should there be question/concern in the interim.   Return in about 3 months (around 04/18/2022).  Marzetta Board, DPM

## 2022-01-24 ENCOUNTER — Other Ambulatory Visit: Payer: Self-pay | Admitting: *Deleted

## 2022-01-24 DIAGNOSIS — D509 Iron deficiency anemia, unspecified: Secondary | ICD-10-CM

## 2022-01-25 ENCOUNTER — Other Ambulatory Visit: Payer: Self-pay

## 2022-01-25 ENCOUNTER — Telehealth: Payer: Self-pay

## 2022-01-25 ENCOUNTER — Telehealth: Payer: Self-pay | Admitting: Family Medicine

## 2022-01-25 ENCOUNTER — Inpatient Hospital Stay: Payer: No Typology Code available for payment source | Admitting: Hematology and Oncology

## 2022-01-25 ENCOUNTER — Inpatient Hospital Stay: Payer: No Typology Code available for payment source | Attending: Adult Health

## 2022-01-25 ENCOUNTER — Inpatient Hospital Stay: Payer: No Typology Code available for payment source

## 2022-01-25 ENCOUNTER — Inpatient Hospital Stay (HOSPITAL_BASED_OUTPATIENT_CLINIC_OR_DEPARTMENT_OTHER): Payer: No Typology Code available for payment source | Admitting: Hematology and Oncology

## 2022-01-25 ENCOUNTER — Encounter: Payer: Self-pay | Admitting: Hematology and Oncology

## 2022-01-25 VITALS — BP 137/54 | HR 70 | Temp 98.2°F | Resp 18 | Wt 332.7 lb

## 2022-01-25 DIAGNOSIS — I130 Hypertensive heart and chronic kidney disease with heart failure and stage 1 through stage 4 chronic kidney disease, or unspecified chronic kidney disease: Secondary | ICD-10-CM | POA: Insufficient documentation

## 2022-01-25 DIAGNOSIS — I5033 Acute on chronic diastolic (congestive) heart failure: Secondary | ICD-10-CM | POA: Diagnosis not present

## 2022-01-25 DIAGNOSIS — N184 Chronic kidney disease, stage 4 (severe): Secondary | ICD-10-CM | POA: Diagnosis not present

## 2022-01-25 DIAGNOSIS — D509 Iron deficiency anemia, unspecified: Secondary | ICD-10-CM

## 2022-01-25 DIAGNOSIS — Z800 Family history of malignant neoplasm of digestive organs: Secondary | ICD-10-CM | POA: Insufficient documentation

## 2022-01-25 DIAGNOSIS — E1122 Type 2 diabetes mellitus with diabetic chronic kidney disease: Secondary | ICD-10-CM | POA: Diagnosis not present

## 2022-01-25 DIAGNOSIS — Z87891 Personal history of nicotine dependence: Secondary | ICD-10-CM | POA: Insufficient documentation

## 2022-01-25 LAB — CBC WITH DIFFERENTIAL (CANCER CENTER ONLY)
Abs Immature Granulocytes: 0.02 10*3/uL (ref 0.00–0.07)
Basophils Absolute: 0 10*3/uL (ref 0.0–0.1)
Basophils Relative: 0 %
Eosinophils Absolute: 0.1 10*3/uL (ref 0.0–0.5)
Eosinophils Relative: 1 %
HCT: 40 % (ref 36.0–46.0)
Hemoglobin: 12.8 g/dL (ref 12.0–15.0)
Immature Granulocytes: 0 %
Lymphocytes Relative: 14 %
Lymphs Abs: 1.2 10*3/uL (ref 0.7–4.0)
MCH: 29.6 pg (ref 26.0–34.0)
MCHC: 32 g/dL (ref 30.0–36.0)
MCV: 92.6 fL (ref 80.0–100.0)
Monocytes Absolute: 0.7 10*3/uL (ref 0.1–1.0)
Monocytes Relative: 7 %
Neutro Abs: 6.8 10*3/uL (ref 1.7–7.7)
Neutrophils Relative %: 78 %
Platelet Count: 224 10*3/uL (ref 150–400)
RBC: 4.32 MIL/uL (ref 3.87–5.11)
RDW: 14.5 % (ref 11.5–15.5)
WBC Count: 8.8 10*3/uL (ref 4.0–10.5)
nRBC: 0 % (ref 0.0–0.2)

## 2022-01-25 LAB — CMP (CANCER CENTER ONLY)
ALT: 9 U/L (ref 0–44)
AST: 17 U/L (ref 15–41)
Albumin: 3.4 g/dL — ABNORMAL LOW (ref 3.5–5.0)
Alkaline Phosphatase: 65 U/L (ref 38–126)
Anion gap: 11 (ref 5–15)
BUN: 30 mg/dL — ABNORMAL HIGH (ref 8–23)
CO2: 30 mmol/L (ref 22–32)
Calcium: 9 mg/dL (ref 8.9–10.3)
Chloride: 98 mmol/L (ref 98–111)
Creatinine: 2.23 mg/dL — ABNORMAL HIGH (ref 0.44–1.00)
GFR, Estimated: 23 mL/min — ABNORMAL LOW (ref >60)
Glucose, Bld: 146 mg/dL — ABNORMAL HIGH (ref 70–99)
Potassium: 3 mmol/L — ABNORMAL LOW (ref 3.5–5.1)
Sodium: 139 mmol/L (ref 135–145)
Total Bilirubin: 0.4 mg/dL (ref 0.3–1.2)
Total Protein: 7.9 g/dL (ref 6.5–8.1)

## 2022-01-25 LAB — IRON AND IRON BINDING CAPACITY (CC-WL,HP ONLY)
Iron: 29 ug/dL (ref 28–170)
Saturation Ratios: 11 % (ref 10.4–31.8)
TIBC: 260 ug/dL (ref 250–450)
UIBC: 231 ug/dL (ref 148–442)

## 2022-01-25 LAB — FERRITIN: Ferritin: 51 ng/mL (ref 11–307)

## 2022-01-25 NOTE — Progress Notes (Signed)
Patient states she needs her medications refill and unsure what is going on.   BP Readings from Last 3 Encounters:  01/20/22 110/60  01/18/22 (!) 147/71  01/17/22 (!) 145/55    Lab Results  Component Value Date   HGBA1C 6.3 (H) 11/12/2021     Patient obtains medications through Vials  90 Days   Last adherence delivery included:  Montelukast 10 mg one tablet daily  Atorvastatin 20 mg one tablet daily Carvedilol 25 mg one tablet twice daily Gabapentin 100 mg one capsule 2 times daily Flonase 50 MCG/ACT nasal spray  Hydralazine 50 mg one tablet 3 times daily  Isosorbide dinitrate 20 mg in the morning and evening with Hydralazine 50 mg dose Pantoprazole 40 mg 1 tablet daily  Freestyle Libre 14 day sensor -  Place one sensor on the skin every 14 days  Jardiance 10 mg-  1 tablet daily before breakfast-   Patient declined medications  last month Lisinopril 20 mg one tablet daily - Patient states medication Was stop Furosemide 40 mg one tablet four times per week (Adequate supply)  Aspirin 81 mg one tablet daily - OTC  Albuterol 108 MCG/ACT inhaler- PRN (Adequate supply)  Test Strips- Adequate Supply Lancets - adequate Supply  Patient is due for next adherence delivery on: 01/25/2022. Called patient and reviewed medications and coordinated delivery.  This delivery to include: Montelukast 10 mg one tablet daily Carvedilol 25 mg one tablet twice daily Gabapentin 100 mg one capsule 2 times daily Hydralazine 50 mg one tablet 3 times daily  Isosorbide dinitrate 20 mg in the morning and evening with Hydralazine 50 mg dose Freestyle Libre 14 day sensor -  Place one sensor on the skin every 14 days  Test Strips   Patient declined the following medications:  Atorvastatin 20 mg one tablet daily- Adequate supply Flonase 50 MCG/ACT nasal spray - Adequate supply Pantoprazole 40 mg 1 tablet daily  - Adequate supply Furosemide 40 mg one tablet four times per week (Adequate supply)   Aspirin 81 mg one tablet daily - OTC  Albuterol 108 MCG/ACT inhaler- PRN (Adequate supply)  Lancets - adequate Supply  Patient needs refills for None ID.  Confirmed delivery date of 01/25/2022.  Acute [facility].Patient states she will give Jardiance back when they deliver her medications.  [place] Pharmacist Assistant [phone]

## 2022-01-25 NOTE — Telephone Encounter (Signed)
Pt said can you Check lab work from [facility] and pt stated her potassium is low

## 2022-01-25 NOTE — Telephone Encounter (Signed)
-----   Message from [name], MD sent at 01/25/2022  2:02 PM EST ----- Stacey Page  Can you let her know about the hypokalemia, could be from diuretic use, she may have to talk to her PCP about potassium supplementation regularly.

## 2022-01-25 NOTE — Telephone Encounter (Signed)
Called pt per MD and she verbalized understanding and states she will call PCP.

## 2022-01-25 NOTE — Progress Notes (Signed)
[facility] Follow up:    Stacey Page, [place] [id number]   DIAGNOSIS: Iron deficiency anemia.  SUMMARY OF HEMATOLOGIC HISTORY:  1.  On October 14, 2020: Patient with anemia panel showing hematocrit of 32.9, normal J88, normal folic acid, ferritin of 27, iron saturation of 12%, reticulocyte 1.2%.  (A) cologuard normal  (B) Chronic Kidney Disease: stage IV  (C) recommended oral iron and started on Ferrous Sulfate 334m BID in 11/2020  CURRENT THERAPY: oral iron  INTERVAL HISTORY:  Stacey [id number].o. female returns for evaluation of her iron deficiency anemia.   She arrived to the appointment today with her daughter.  Since last visit, she had hospitalization in September with pericardial fluid, increased lower extremity edema and kidney failure after she stopped taking her Lasix.  She has finally recovered.  She has started feeling cold recently and wondered if her anemia is worse.  She continues to take oral iron twice a day.  She denies any hematochezia or melena.  Rest of the pertinent 10 point ROS reviewed and negative.   Patient Active Problem List   Diagnosis Date Noted   Mild pulmonary hypertension ([place] 01/09/2022   Need for influenza vaccination 12/02/2021   Dysfunction of left eustachian tube 12/02/2021   Allergic rhinitis 12/02/2021   Chronic respiratory failure with hypoxia ([place] 11/11/2021   Demand ischemia    Respiratory acidosis    Acute respiratory failure with hypercapnia (HCC)    Acute on chronic heart failure with preserved ejection fraction (HCC)    CHF (congestive heart failure) ([place] 10/09/2021   Reactive airway disease 09/21/2021   Viral syndrome 09/21/2021   Hypertensive retinopathy of right eye, grade 1 08/11/2021   Grade 2 hypertensive retinopathy, left 08/11/2021   Nuclear sclerotic cataract of both eyes 08/11/2021   Need for shingles vaccine 07/14/2021   Iron deficiency anemia  01/13/2021   CKD (chronic kidney disease) stage 4, GFR 15-29 ml/min (HG[place] 07/09/2019   Diabetes mellitus type 2 in obese (HT[place] 07/02/2019   Polyneuropathy associated with underlying disease ([facility] 11/28/2018   Severe episode of recurrent major depressive disorder, without psychotic features (HM[place] 11/28/2018   Type 2 diabetes mellitus with diabetic peripheral angiopathy without gangrene, without long-term current use of insulin (HD[place] 11/28/2018   Poor short term memory 11/28/2018   Moderate aortic stenosis by prior echocardiogram 11/06/2018   Diabetes mellitus ([place] 03/21/2017   Acute renal failure superimposed on stage 2 chronic kidney disease (HF[place] 03/17/2016   Symptomatic anemia 03/17/2016   S/P total knee arthroplasty, right 03/17/2016   Unilateral primary osteoarthritis, right knee    Hyperlipidemia associated with type 2 diabetes mellitus ([place] 12/15/2015   Pain in both lower extremities 11/19/2015   Numbness in both hands 08/13/2015   Panic attack 06/09/2015   Right knee pain 05/14/2015   Chronic renal insufficiency 09/16/2014   Pneumothorax on right    Atypical mycobacterium infection    ILD (interstitial lung disease) 2/2 MAI s/p med rxn    Class 3 severe obesity with serious comorbidity and body mass index (BMI) of 50.0 to 59.9 in adult (HL[place] 04/03/2014   OSA (obstructive sleep apnea) 02/25/2014   Hypertensive heart disease with chronic diastolic congestive heart failure (HD[place] 02/25/2014   DOE (dyspnea on exertion) 02/25/2014   Essential hypertension 02/25/2014    has No Known Allergies.  MEDICAL HISTORY: Past Medical History:  Diagnosis Date   Anxiety    doesn't take any meds  Arthritis of right knee 03/14/2016   Asthma    Back pain    Chronic diastolic CHF (congestive heart failure) ([place])    HF with Preserved EF (60-65%) - Grade II Diastolic Dysfunction (Hypertensive Heart Disease). takes Furosemide daily   Depression    doesn't take meds   Diabetes ([place])     takes Januvia daily   Eczema    uses cream as needed   GERD (gastroesophageal reflux disease)    History of bronchitis as a child    HTN (hypertension)    Insomnia    Moderate aortic stenosis by prior echocardiogram 04/2017   10/15/2021: Horald Pollen calcification with moderate AS, MG 23 mmHg   Mycobacterium avium-intracellulare infection ([place]) 2016   OA (osteoarthritis)    Obese    OSA (obstructive sleep apnea) 02/25/2014   wears CPAP at night   Peripheral neuropathy    takes Gabapentin as needed   Pneumonia    hx of-2010   Seasonal allergies    uses Flonase daily   Sleep apnea     SURGICAL HISTORY: Past Surgical History:  Procedure Laterality Date   BREAST BIOPSY Right 03/2018   BREAST LUMPECTOMY WITH RADIOACTIVE SEED LOCALIZATION Right 12/28/2018   Procedure: RIGHT BREAST LUMPECTOMY WITH RADIOACTIVE SEED LOCALIZATION;  Surgeon: Jovita Kussmaul, MD;  Location: [facility];  Service: General;  Laterality: Right;   BREAST SURGERY     CARDIOPULMONARY EXERCISE TEST (CPX)  06/21/2021   (Performed on Coreg 25 mg twice daily): PFTs: FVC 1.76 (60%), FEV1 1.59 (77%), ratio 114% => RESTRICTIVE PHYSIOLOGY; 5 min- 1 mph - 2% grade. 7/10 dyspnea -> pulse ox range during exercise 95 to 98%, low of 90%, 97% on recovery; BP 106/84-160/64; heart rate 64-90 bpm (60% MPHR) -> Chronotropic Incompetence;;MAIN FACTOR for Exercise Limitation = BODY HABITUS w/ Chronotropic Incompetence.   CESAREAN SECTION  x2   COLONOSCOPY     CORONARY CALCIUM SCORE & CT ANGIOGRAM  09/2017   Coronary Ca Score = 11 (low).  CTA- no obstructive CAD (minimal disease)   JOINT REPLACEMENT     KNEE ARTHROSCOPY Right    LUNG BIOPSY Right 06/10/2014   Procedure: LUNG BIOPSY;  Surgeon: Ivin Poot, MD;  Location: [facility];  Service: Thoracic;  Laterality: Right;   POLYPECTOMY     throat   RIGHT HEART CATH N/A 10/15/2021   Procedure: RIGHT HEART CATH;  Surgeon: Belva Crome, MD;  Location: [facility];  Service: CV: Mild  Pulmonary Hypertension (WHO Group 3).  Mean PAP 28 mmHg, PCWP 13 mmHg.  PVR 2.73 [place].  RAP 6 mmHg.CO-CI 5.5L/min, 2.17 L/min/m.   TOTAL KNEE ARTHROPLASTY Right 03/14/2016   Procedure: RIGHT TOTAL KNEE ARTHROPLASTY;  Surgeon: Marybelle Killings, MD;  Location: [facility];  Service: Orthopedics;  Laterality: Right;   TRANSTHORACIC ECHOCARDIOGRAM  10/10/2021   EF 70 to 75%.  Hyperdynamic with no RWMA.  Moderate LVH.  Moderate LA dilation indicating likely diastolic dysfunction although not interpretable.  Moderately elevated PAP with normal RV function, dilated IVC with elevated RAP estimated at 15 mmHg..  Moderate MAC with no MR.  AOV calcification with Moderate AS-mean AvG 23 mmHg.   TRANSTHORACIC ECHOCARDIOGRAM  06/14/2021   EF 60 to 65%.  No RWMA.  Moderate cLVH with GR 1 DD.  Mild LA dilation.  Mild to moderate aortic calcification/stenosis.  Mean AVG 18 mmHg.   VIDEO ASSISTED THORACOSCOPY Right 06/10/2014   Procedure: VIDEO ASSISTED THORACOSCOPY;  Surgeon: Ivin Poot, MD;  Location[facility];  Service: Thoracic;  Laterality: Right;    SOCIAL HISTORY: Social History   Socioeconomic History   Marital status: Widowed    Spouse name: Not on file   Number of children: Not on file   Years of education: Not on file   Highest education level: Not on file  Occupational History   Occupation: retired  Tobacco Use   Smoking status: Former    Packs/day: 0.25    Years: 15.00    Total pack years: 3.75    Types: Cigarettes    Quit date: 02/08/1980    Years since quitting: 41.9   Smokeless tobacco: Former  Scientific laboratory technician Use: Never used  Substance and Sexual Activity   Alcohol use: Yes    Alcohol/week: 0.0 standard drinks of alcohol    Comment: occassional/social/rare   Drug use: Not Currently   Sexual activity: Not Currently  Other Topics Concern   Not on file  Social History Narrative   Not on file   Social Determinants of Health   Financial Resource Strain: Low Risk  (08/17/2021)    Overall Financial Resource Strain (CARDIA)    Difficulty of Paying Living Expenses: Not hard at all  Food Insecurity: No Food Insecurity (08/17/2021)   Hunger Vital Sign    Worried About Running Out of Food in the Last Year: Never true    Ran Out of Food in the Last Year: Never true  Transportation Needs: No Transportation Needs (08/17/2021)   PRAPARE - Hydrologist (Medical): No    Lack of Transportation (Non-Medical): No  Physical Activity: Insufficiently Active (08/17/2021)   Exercise Vital Sign    Days of Exercise per Week: 3 days    Minutes of Exercise per Session: 30 min  Stress: No Stress Concern Present (08/17/2021)   [place]    Feeling of Stress : Not at all  Social Connections: Socially Isolated (08/17/2021)   Social Connection and Isolation Panel [NHANES]    Frequency of Communication with Friends and Family: Three times a week    Frequency of Social Gatherings with Friends and Family: Three times a week    Attends Religious Services: Never    Active Member of Clubs or Organizations: No    Attends Archivist Meetings: Never    Marital Status: Widowed  Intimate Partner Violence: Not At Risk (08/17/2021)   Humiliation, Afraid, Rape, and Kick questionnaire    Fear of Current or Ex-Partner: No    Emotionally Abused: No    Physically Abused: No    Sexually Abused: No    FAMILY HISTORY: Family History  Problem Relation Age of Onset   COPD Father    Hypothyroidism Father    Anemia Father        iron deficiency   High blood pressure Father    Alcoholism Father    Cancer Mother 22       pancreatic   High blood pressure Mother    High Cholesterol Mother    Breast cancer Neg Hx     Review of Systems  Constitutional:  Negative for appetite change, chills, fatigue, fever and unexpected weight change.  HENT:   Negative for hearing loss, lump/mass and trouble swallowing.    Eyes:  Negative for eye problems and icterus.  Respiratory:  Negative for chest tightness, cough and shortness of breath.   Cardiovascular:  Negative for chest pain, leg swelling  and palpitations.  Gastrointestinal:  Negative for abdominal distention, abdominal pain, constipation, diarrhea, nausea and vomiting.  Endocrine: Negative for hot flashes.  Genitourinary:  Negative for difficulty urinating.   Musculoskeletal:  Negative for arthralgias.  Skin:  Negative for itching and rash.  Neurological:  Negative for dizziness, extremity weakness, headaches and numbness.  Hematological:  Negative for adenopathy. Does not bruise/bleed easily.  Psychiatric/Behavioral:  Negative for depression. The patient is not nervous/anxious.       PHYSICAL EXAMINATION  ECOG PERFORMANCE STATUS: 1 - Symptomatic but completely ambulatory  Vitals:   01/25/22 1211  BP: (!) 137/54  Pulse: 70  Resp: 18  Temp: 98.2 F (36.8 C)  SpO2: 97%    Physical Exam Constitutional:      General: She is not in acute distress.    Appearance: Normal appearance. She is not toxic-appearing.     Comments: Portable oxygen  HENT:     Head: Normocephalic and atraumatic.  Eyes:     General: No scleral icterus. Cardiovascular:     Rate and Rhythm: Normal rate and regular rhythm.     Pulses: Normal pulses.     Heart sounds: Normal heart sounds.  Pulmonary:     Effort: Pulmonary effort is normal.     Breath sounds: Normal breath sounds.  Abdominal:     General: Abdomen is flat. Bowel sounds are normal. There is no distension.     Palpations: Abdomen is soft.     Tenderness: There is no abdominal tenderness.  Musculoskeletal:        General: No swelling.     Cervical back: Neck supple.  Lymphadenopathy:     Cervical: No cervical adenopathy.  Skin:    General: Skin is warm and dry.     Findings: No rash.  Neurological:     General: No focal deficit present.     Mental Status: She is alert.  Psychiatric:         Mood and Affect: Mood normal.        Behavior: Behavior normal.     LABORATORY DATA:  CBC    Component Value Date/Time   WBC 8.8 01/25/2022 1156   WBC 3.1 (L) 10/15/2021 0736   RBC 4.32 01/25/2022 1156   HGB 12.8 01/25/2022 1156   HCT 40.0 01/25/2022 1156   HCT 32.9 (L) 10/14/2020 1057   PLT 224 01/25/2022 1156   MCV 92.6 01/25/2022 1156   MCH 29.6 01/25/2022 1156   MCHC 32.0 01/25/2022 1156   RDW 14.5 01/25/2022 1156   LYMPHSABS 1.2 01/25/2022 1156   MONOABS 0.7 01/25/2022 1156   EOSABS 0.1 01/25/2022 1156   BASOSABS 0.0 01/25/2022 1156    CMP     Component Value Date/Time   NA 143 01/14/2022 0902   NA 143 11/12/2021 1508   K 3.7 01/14/2022 0902   CL 98 01/14/2022 0902   CO2 36 (H) 01/14/2022 0902   GLUCOSE 110 (H) 01/14/2022 0902   BUN 30 (H) 01/14/2022 0902   BUN 20 11/12/2021 1508   CREATININE 2.03 (H) 01/14/2022 0902   CREATININE 1.87 (H) 07/14/2021 0940   CREATININE 1.36 (H) 03/19/2015 1030   CALCIUM 9.7 01/14/2022 0902   PROT 7.6 07/14/2021 0940   PROT 7.6 10/14/2020 1057   ALBUMIN 3.9 07/14/2021 0940   ALBUMIN 4.2 10/14/2020 1057   AST 10 (L) 07/14/2021 0940   ALT 7 07/14/2021 0940   ALKPHOS 65 07/14/2021 0940   BILITOT 0.3 07/14/2021 0940   GFRNONAA 21 (  L) 10/16/2021 0354   GFRNONAA 29 (L) 07/14/2021 0940   GFRAA 40 (L) 10/28/2019 2956     ASSESSMENT and THERAPY PLAN:   This is a very pleasant 71 year old female patient with iron deficiency anemia who returns to clinic for follow-up today.  Since last visit she continues on oral iron twice a day.  He has been tolerating this well.  She has no signs of bleeding. CBC from today with no evidence of anemia.  CMP today with hypokalemia, baseline CKD.  Iron panel today with no concerns.  Ferritin pending At this time she has explain her symptoms.  Will await ferritin results and discuss any additional recommendations. Encouraged her to talk to her PCP as well for management of hypothyroidism.  This is  likely related to the use of diuretics , I will message that she should contact her PCP about some potassium supplementation concomitant with diuretic use  All questions were answered. The patient knows to call the clinic with any problems, questions or concerns. We can certainly see the patient much sooner if necessary.  Total encounter time: 30 minutes in face-to-face visit time, chart review, lab review, care coordination, and documentation of the encounter.  *Total Encounter Time as defined by the Centers for Medicare and Medicaid Services includes, in addition to the face-to-face time of a patient visit (documented in the note above) non-face-to-face time: obtaining and reviewing outside history, ordering and reviewing medications, tests or procedures, care coordination (communications with other health care professionals or caregivers) and documentation in the medical record.

## 2022-01-26 ENCOUNTER — Encounter: Payer: Self-pay | Admitting: Nurse Practitioner

## 2022-01-26 ENCOUNTER — Ambulatory Visit (INDEPENDENT_AMBULATORY_CARE_PROVIDER_SITE_OTHER): Payer: No Typology Code available for payment source | Admitting: Nurse Practitioner

## 2022-01-26 VITALS — BP 138/68 | HR 65 | Temp 97.3°F | Ht 68.0 in | Wt 338.2 lb

## 2022-01-26 DIAGNOSIS — E876 Hypokalemia: Secondary | ICD-10-CM

## 2022-01-26 DIAGNOSIS — J9611 Chronic respiratory failure with hypoxia: Secondary | ICD-10-CM | POA: Diagnosis not present

## 2022-01-26 MED ORDER — POTASSIUM CHLORIDE CRYS ER 10 MEQ PO TBCR: 10.0000 meq | EXTENDED_RELEASE_TABLET | Freq: Every day | ORAL | 0 refills | Status: DC

## 2022-01-26 NOTE — Patient Instructions (Signed)
It was great to see you!  Start potassium 1 tablet daily. Come back in 1 week for a lab visit to recheck your potassium.   Keep your appointment with Dr. Ethelene Hal on 02/08/22.   Take care,  Vance Peper, NP

## 2022-01-26 NOTE — Assessment & Plan Note (Signed)
Her potassium was 3.0 yesterday.  She is currently taking furosemide 80 mg daily and Jardiance 10 mg daily.  She does have a history of CKD 4.  Will start her on potassium 10 mEq daily and recheck potassium level in 5 to 6 days.  Keep neck scheduled appointment with PCP on 02/08/2022.

## 2022-01-26 NOTE — Progress Notes (Signed)
   Acute Office Visit  Subjective:     Patient ID: Stacey Page, female    DOB: 03/09/1950, 71 y.o.   MRN: [id number]  Chief Complaint  Patient presents with   Advice Only    Discuss low potassium levels    HPI Patient is in today for low potassium levels.  She went to the cancer center yesterday and received a call that her potassium was low and to follow-up with her PCP.  She does have history of congestive heart failure and her furosemide was increased to 80 mg daily 3 months ago.  She was also started on Jardiance for her blood sugars, heart, and kidneys 2 months ago.  She is not currently taking any potassium supplements.  She has been having some fatigue and weakness.  She denies cramping, chest pain.  Her breathing is stable.  ROS See pertinent positives and negatives per HPI.     Objective:    BP 138/68 (BP Location: Right Arm, Patient Position: Sitting, Cuff Size: Large)   Pulse 65   Temp (!) 97.3 F (36.3 C) (Temporal)   Ht '5\' 8"'$  (1.727 m)   Wt (!) 338 lb 3.2 oz (153.4 kg)   SpO2 95%   BMI 51.42 kg/m    Physical Exam Vitals and nursing note reviewed.  Constitutional:      General: She is not in acute distress.    Appearance: Normal appearance.  HENT:     Head: Normocephalic.  Eyes:     Conjunctiva/sclera: Conjunctivae normal.  Cardiovascular:     Rate and Rhythm: Normal rate and regular rhythm.     Pulses: Normal pulses.     Heart sounds: Normal heart sounds.  Pulmonary:     Effort: Pulmonary effort is normal.     Breath sounds: Normal breath sounds.  Musculoskeletal:     Cervical back: Normal range of motion.  Skin:    General: Skin is warm.  Neurological:     General: No focal deficit present.     Mental Status: She is alert and oriented to person, place, and time.  Psychiatric:        Mood and Affect: Mood normal.        Behavior: Behavior normal.        Thought Content: Thought content normal.        Judgment: Judgment normal.        Assessment & Plan:   Problem List Items Addressed This Visit       Other   Hypokalemia - Primary    Her potassium was 3.0 yesterday.  She is currently taking furosemide 80 mg daily and Jardiance 10 mg daily.  She does have a history of CKD 4.  Will start her on potassium 10 mEq daily and recheck potassium level in 5 to 6 days.  Keep neck scheduled appointment with PCP on 02/08/2022.      Relevant Orders   Basic Metabolic Panel (BMET)    Meds ordered this encounter  Medications   potassium chloride (KLOR-CON M) 10 MEQ tablet    Sig: Take 1 tablet (10 mEq total) by mouth daily.    Dispense:  30 tablet    Refill:  0    Return if symptoms worsen or fail to improve, for keep appointment with Dr. Ethelene Hal 02/08/22.  Charyl Dancer, NP

## 2022-01-26 NOTE — Telephone Encounter (Signed)
Patient has appointment scheduled for today to discuss labs

## 2022-01-27 ENCOUNTER — Ambulatory Visit
Admission: RE | Admit: 2022-01-27 | Discharge: 2022-01-27 | Disposition: A | Discharge status: Home / Self Care | Payer: No Typology Code available for payment source | Source: Ambulatory Visit | Attending: Adult Health | Admitting: Adult Health

## 2022-01-27 DIAGNOSIS — R918 Other nonspecific abnormal finding of lung field: Secondary | ICD-10-CM | POA: Diagnosis not present

## 2022-01-27 DIAGNOSIS — J8410 Pulmonary fibrosis, unspecified: Secondary | ICD-10-CM | POA: Diagnosis not present

## 2022-01-27 DIAGNOSIS — I700 Atherosclerosis of aorta: Secondary | ICD-10-CM | POA: Diagnosis not present

## 2022-01-27 DIAGNOSIS — J849 Interstitial pulmonary disease, unspecified: Secondary | ICD-10-CM

## 2022-01-27 DIAGNOSIS — J449 Chronic obstructive pulmonary disease, unspecified: Secondary | ICD-10-CM | POA: Diagnosis not present

## 2022-02-01 ENCOUNTER — Other Ambulatory Visit: Payer: No Typology Code available for payment source

## 2022-02-01 ENCOUNTER — Encounter (HOSPITAL_BASED_OUTPATIENT_CLINIC_OR_DEPARTMENT_OTHER): Payer: Self-pay | Admitting: Emergency Medicine

## 2022-02-01 ENCOUNTER — Emergency Department (HOSPITAL_BASED_OUTPATIENT_CLINIC_OR_DEPARTMENT_OTHER)
Admission: EM | Admit: 2022-02-01 | Discharge: 2022-02-01 | Disposition: A | Discharge status: Home / Self Care | Payer: No Typology Code available for payment source | Attending: Emergency Medicine | Admitting: Emergency Medicine

## 2022-02-01 ENCOUNTER — Emergency Department (HOSPITAL_BASED_OUTPATIENT_CLINIC_OR_DEPARTMENT_OTHER): Payer: No Typology Code available for payment source | Admitting: Radiology

## 2022-02-01 DIAGNOSIS — I110 Hypertensive heart disease with heart failure: Secondary | ICD-10-CM | POA: Insufficient documentation

## 2022-02-01 DIAGNOSIS — Z7951 Long term (current) use of inhaled steroids: Secondary | ICD-10-CM | POA: Diagnosis not present

## 2022-02-01 DIAGNOSIS — E119 Type 2 diabetes mellitus without complications: Secondary | ICD-10-CM | POA: Insufficient documentation

## 2022-02-01 DIAGNOSIS — N3000 Acute cystitis without hematuria: Secondary | ICD-10-CM

## 2022-02-01 DIAGNOSIS — R0602 Shortness of breath: Secondary | ICD-10-CM | POA: Diagnosis not present

## 2022-02-01 DIAGNOSIS — Z79899 Other long term (current) drug therapy: Secondary | ICD-10-CM | POA: Diagnosis not present

## 2022-02-01 DIAGNOSIS — I509 Heart failure, unspecified: Secondary | ICD-10-CM | POA: Insufficient documentation

## 2022-02-01 DIAGNOSIS — Z7982 Long term (current) use of aspirin: Secondary | ICD-10-CM | POA: Diagnosis not present

## 2022-02-01 DIAGNOSIS — Z1152 Encounter for screening for COVID-19: Secondary | ICD-10-CM | POA: Insufficient documentation

## 2022-02-01 LAB — URINALYSIS, ROUTINE W REFLEX MICROSCOPIC
Bilirubin Urine: NEGATIVE
Glucose, UA: 250 mg/dL — AB
Hgb urine dipstick: NEGATIVE
Ketones, ur: NEGATIVE mg/dL
Nitrite: NEGATIVE
Specific Gravity, Urine: 1.009 (ref 1.005–1.030)
WBC, UA: >50 WBC/hpf — ABNORMAL HIGH (ref 0–5)
pH: 5.5 (ref 5.0–8.0)

## 2022-02-01 LAB — BRAIN NATRIURETIC PEPTIDE: B Natriuretic Peptide: 99 pg/mL (ref 0.0–100.0)

## 2022-02-01 LAB — BASIC METABOLIC PANEL
Anion gap: 12 (ref 5–15)
BUN: 28 mg/dL — ABNORMAL HIGH (ref 8–23)
CO2: 29 mmol/L (ref 22–32)
Calcium: 9.8 mg/dL (ref 8.9–10.3)
Chloride: 96 mmol/L — ABNORMAL LOW (ref 98–111)
Creatinine, Ser: 2.37 mg/dL — ABNORMAL HIGH (ref 0.44–1.00)
GFR, Estimated: 22 mL/min — ABNORMAL LOW (ref >60)
Glucose, Bld: 112 mg/dL — ABNORMAL HIGH (ref 70–99)
Potassium: 3 mmol/L — ABNORMAL LOW (ref 3.5–5.1)
Sodium: 137 mmol/L (ref 135–145)

## 2022-02-01 LAB — RESP PANEL BY RT-PCR (RSV, FLU A&B, COVID)  RVPGX2
Influenza A by PCR: NEGATIVE
Influenza B by PCR: NEGATIVE
Resp Syncytial Virus by PCR: NEGATIVE
SARS Coronavirus 2 by RT PCR: NEGATIVE

## 2022-02-01 LAB — CBC WITH DIFFERENTIAL/PLATELET
Abs Immature Granulocytes: 0.05 10*3/uL (ref 0.00–0.07)
Basophils Absolute: 0.1 10*3/uL (ref 0.0–0.1)
Basophils Relative: 1 %
Eosinophils Absolute: 0.1 10*3/uL (ref 0.0–0.5)
Eosinophils Relative: 1 %
HCT: 38.3 % (ref 36.0–46.0)
Hemoglobin: 12.3 g/dL (ref 12.0–15.0)
Immature Granulocytes: 1 %
Lymphocytes Relative: 15 %
Lymphs Abs: 1.5 10*3/uL (ref 0.7–4.0)
MCH: 28.9 pg (ref 26.0–34.0)
MCHC: 32.1 g/dL (ref 30.0–36.0)
MCV: 90.1 fL (ref 80.0–100.0)
Monocytes Absolute: 0.8 10*3/uL (ref 0.1–1.0)
Monocytes Relative: 8 %
Neutro Abs: 7.1 10*3/uL (ref 1.7–7.7)
Neutrophils Relative %: 74 %
Platelets: 287 10*3/uL (ref 150–400)
RBC: 4.25 MIL/uL (ref 3.87–5.11)
RDW: 14.8 % (ref 11.5–15.5)
WBC: 9.5 10*3/uL (ref 4.0–10.5)
nRBC: 0 % (ref 0.0–0.2)

## 2022-02-01 LAB — TROPONIN I (HIGH SENSITIVITY)
Troponin I (High Sensitivity): 24 ng/L — ABNORMAL HIGH (ref <18)
Troponin I (High Sensitivity): 26 ng/L — ABNORMAL HIGH (ref <18)

## 2022-02-01 MED ORDER — POTASSIUM CHLORIDE CRYS ER 20 MEQ PO TBCR
40.0000 meq | EXTENDED_RELEASE_TABLET | Freq: Once | ORAL | Status: AC
  Administered 2022-02-01: 40 meq via ORAL
  Filled 2022-02-01: qty 2

## 2022-02-01 MED ORDER — ACETAMINOPHEN 325 MG PO TABS
650.0000 mg | ORAL_TABLET | Freq: Once | ORAL | Status: AC
  Administered 2022-02-01: 650 mg via ORAL
  Filled 2022-02-01: qty 2

## 2022-02-01 MED ORDER — FOSFOMYCIN TROMETHAMINE 3 G PO PACK
3.0000 g | PACK | Freq: Once | ORAL | Status: AC
  Administered 2022-02-01: 3 g via ORAL
  Filled 2022-02-01: qty 3

## 2022-02-01 NOTE — ED Notes (Signed)
Does not feel well as she stated, she states her K has been low lately. NSR noted on monitor, POX on RA @ 95%. Denies abd pain. Awaiting completed lab results. Denies N/V/D. States her fever began yesterday.

## 2022-02-01 NOTE — ED Notes (Signed)
ED Provider at bedside. 

## 2022-02-01 NOTE — ED Provider Notes (Signed)
[facility]ERG[facility] DEPT Provider Note   CSN: [id number] Arrival date & time: 02/01/22  1147     History  Chief Complaint  Patient presents with   Shortness of Breath    Stacey Page is a 71 y.o. female.  Patient here with bodyaches and fever and shortness of breath.  History of cardiac disease including CHF, reflux, hypertension and diabetes and valvular heart disease.  She describes body aches for the last few days.  May be fever.  Denies any chest pain, weakness, numbness, vomiting, diarrhea.  No pain with urination.  The history is provided by the patient.       Home Medications Prior to Admission medications   Medication Sig Start Date End Date Taking? Authorizing Provider  albuterol (VENTOLIN HFA) 108 (90 Base) MCG/ACT inhaler INHALE 1-2 PUFFS into THE lungs EVERY 6 HOURS AS NEEDED FOR WHEEZING AND/OR SHORTNESS OF BREATH 01/12/22   [name], [name], NP  aspirin 81 MG tablet Take 1 tablet (81 mg total) by mouth daily with breakfast. 07/15/16   [name], [name], NP  atorvastatin (LIPITOR) 20 MG tablet Take 1 tablet (20 mg total) by mouth daily. 01/13/22   [name], MD  Blood Glucose Monitoring Suppl ([place] [employer]) w/Device KIT 1 kit by Does not apply route daily as needed. 07/22/21   [name], MD  carvedilol (COREG) 25 MG tablet TAKE ONE TABLET BY MOUTH TWICE DAILY 12/17/21   Leonie Man, MD  cholecalciferol (VITAMIN D3) 25 MCG (1000 UNIT) tablet Take 2,000 Units by mouth daily.    [provider]  Continuous Blood Gluc Sensor (FREESTYLE LIBRE 14 DAY SENSOR) MISC Place one sensor on the skin every 14 days to monitor blood sugars continuously as directed. 08/26/21   [name], MD  Docusate Sodium 100 MG capsule Take 100 mg by mouth 2 (two) times daily.    [provider]  ferrous sulfate 325 (65 FE) MG tablet Take 325 mg by mouth 2 (two) times daily with a meal.    [provider]  fluticasone (FLONASE) 50 MCG/ACT nasal spray instill ONE SPRAY in each nostril daily Patient taking differently: Place 1 spray into both nostrils daily. 09/27/21   [name], [name], NP  furosemide (LASIX) 80 MG tablet Take 1 tablet (80 mg total) by mouth daily. 10/17/21   Regalado, Belkys A, MD  gabapentin (NEURONTIN) 100 MG capsule TAKE ONE CAPSULE BY MOUTH TWICE DAILY 12/16/21   [name], [name], NP  glucose blood (ONETOUCH VERIO) test strip Use to monitor blood sugar twice daily as instructed 09/20/21   [name], MD  hydrALAZINE (APRESOLINE) 50 MG tablet Take 1 tablet (50 mg total) by mouth every 8 (eight) hours. 10/16/21   Regalado, Belkys A, MD  isosorbide dinitrate (ISORDIL) 10 MG tablet Take 1 tablet (10 mg total) by mouth 3 (three) times daily. 12/27/21   Leonie Man, MD  JARDIANCE 10 MG TABS tablet TAKE ONE TABLET BY MOUTH ONCE DAILY BEFORE BREAKFAST 12/23/21   [name], MD  montelukast (SINGULAIR) 10 MG tablet TAKE ONE TABLET BY MOUTH EVERYDAY AT BEDTIME 12/16/21   [name], [name], NP  OneTouch Delica Lancets 40J MISC Use to monitor blood sugars once daily as directed 08/05/21   [name], MD  pantoprazole (PROTONIX) 40 MG tablet Take 1 tablet (40 mg total) by mouth daily. 12/13/21   [name], MD  potassium chloride (KLOR-CON M) 10 MEQ tablet  Take 1 tablet (10 mEq total) by mouth daily. 01/26/22   McElwee, Scheryl Darter, NP      Allergies    Patient has no known allergies.    Review of Systems   Review of Systems  Physical Exam Updated Vital Signs BP (!) 135/107 (BP Location: Right Arm)   Pulse 60 Comment: NSR  Temp (!) 101.6 F (38.7 C) (Oral)   Resp 18   SpO2 100%  Physical Exam Vitals and nursing note reviewed.  Constitutional:      General: She is not in acute distress.    Appearance: She is well-developed. She is not ill-appearing.  HENT:     Head: Normocephalic and atraumatic.     Mouth/Throat:      Mouth: Mucous membranes are moist.  Eyes:     Extraocular Movements: Extraocular movements intact.     Conjunctiva/sclera: Conjunctivae normal.     Pupils: Pupils are equal, round, and reactive to light.  Cardiovascular:     Rate and Rhythm: Normal rate and regular rhythm.     Pulses: Normal pulses.     Heart sounds: Normal heart sounds. No murmur heard. Pulmonary:     Effort: Pulmonary effort is normal. No respiratory distress.     Breath sounds: Normal breath sounds. No decreased breath sounds.  Abdominal:     Palpations: Abdomen is soft.     Tenderness: There is no abdominal tenderness.  Musculoskeletal:        General: No swelling. Normal range of motion.     Cervical back: Normal range of motion and neck supple.  Skin:    General: Skin is warm and dry.     Capillary Refill: Capillary refill takes less than 2 seconds.  Neurological:     Mental Status: She is alert.  Psychiatric:        Mood and Affect: Mood normal.     ED Results / Procedures / Treatments   Labs (all labs ordered are listed, but only abnormal results are displayed) Labs Reviewed  BASIC METABOLIC PANEL - Abnormal; Notable for the following components:      Result Value   Potassium 3.0 (*)    Chloride 96 (*)    Glucose, Bld 112 (*)    BUN 28 (*)    Creatinine, Ser 2.37 (*)    GFR, Estimated 22 (*)    All other components within normal limits  URINALYSIS, ROUTINE W REFLEX MICROSCOPIC - Abnormal; Notable for the following components:   APPearance HAZY (*)    Glucose, UA 250 (*)    Protein, ur TRACE (*)    Leukocytes,Ua LARGE (*)    WBC, UA >50 (*)    Bacteria, UA RARE (*)    All other components within normal limits  TROPONIN I (HIGH SENSITIVITY) - Abnormal; Notable for the following components:   Troponin I (High Sensitivity) 24 (*)    All other components within normal limits  TROPONIN I (HIGH SENSITIVITY) - Abnormal; Notable for the following components:   Troponin I (High Sensitivity) 26  (*)    All other components within normal limits  RESP PANEL BY RT-PCR (RSV, FLU A&B, COVID)  RVPGX2  CBC WITH DIFFERENTIAL/PLATELET  BRAIN NATRIURETIC PEPTIDE    EKG EKG Interpretation  Date/Time:  Tuesday February 01 2022 12:52:26 EST Ventricular Rate:  72 PR Interval:  168 QRS Duration: 102 QT Interval:  452 QTC Calculation: 494 R Axis:   24 Text Interpretation: Normal sinus rhythm with sinus arrhythmia Minimal voltage  criteria for LVH, may be normal variant ( [employer] product ) PREVIOUS ECG IS PRESENT Confirmed by Lennice Sites [phone]) on 02/01/2022 3:14:06 PM  Radiology DG Chest 2 View  Result Date: 02/01/2022 CLINICAL DATA:  Shortness of breath EXAM: CHEST - 2 VIEW COMPARISON:  CT scan of the chest January 27, 2022 FINDINGS: Mild nodularity in the right upper lobe was better appreciated on recent CT imaging and was noted to be stable since 2016 at that time. The right hilum is prominent. No masses were identified in this region on recent CT imaging and the finding is most consistent with a prominent pulmonary artery. The heart size is mildly enlarged. No pneumothorax. No other acute abnormalities are identified. No overt edema or suspicious infiltrate. IMPRESSION: No acute abnormalities. Electronically Signed   By: Dorise Bullion III M.D.   On: 02/01/2022 13:11    Procedures Procedures    Medications Ordered in ED Medications  fosfomycin (MONUROL) packet 3 g (has no administration in time range)  potassium chloride SA (KLOR-CON M) CR tablet 40 mEq (has no administration in time range)  acetaminophen (TYLENOL) tablet 650 mg (650 mg Oral Given 02/01/22 1635)    ED Course/ Medical Decision Making/ A&P                           Medical Decision Making Amount and/or Complexity of Data Reviewed Labs: ordered.  Risk Prescription drug management.   Neysa Arts is here with bodyaches and fever.  Patient with fever but otherwise normal vitals.  Differential diagnosis  viral process versus UTI.  I have no concern for ACS or heart failure but she does have history of these and will check BNP and troponin.  Will check COVID and flu swab.  Will check chest x-ray and urinalysis.  Per my review and interpretation labs shows no significant anemia, electrolyte abnormality or kidney injury.  BNP is unremarkable.  Troponin stable x 2.  EKG shows sinus rhythm.  No ischemic changes.  She not having any chest pain and doubt ACS.  Urinalysis appears positive for infection.  Will treat with fosfomycin.  Potassium was repleted.  I suspect she may also have a viral process as well.  Have no concern for sepsis.  Patient discharged in good condition.  This chart was dictated using voice recognition software.  Despite best efforts to proofread,  errors can occur which can change the documentation meaning.         Final Clinical Impression(s) / ED Diagnoses Final diagnoses:  Acute cystitis without hematuria    Rx / DC Orders ED Discharge Orders     None         Lennice Sites, DO 02/01/22 1856

## 2022-02-01 NOTE — ED Triage Notes (Signed)
Pt c/o SOB today and abnormal vital signs.

## 2022-02-01 NOTE — Progress Notes (Signed)
ATC x1.  LVM to return call. 

## 2022-02-01 NOTE — Discharge Instructions (Signed)
I have treated you for urine infection with a long-acting antibiotic.  I do suspect may be also have a viral process.  Continue Tylenol as needed for fever.  Please return if symptoms worsen.

## 2022-02-01 NOTE — ED Notes (Signed)
Up to restroom, very short of breath upon returning to bed in exam room. NSR cont to be noted on cardiac monitor. POX at 100% on 2lpm via [place]

## 2022-02-01 NOTE — ED Notes (Signed)
Pt declining tylenol for fever.

## 2022-02-02 ENCOUNTER — Ambulatory Visit (INDEPENDENT_AMBULATORY_CARE_PROVIDER_SITE_OTHER): Payer: No Typology Code available for payment source | Admitting: Family Medicine

## 2022-02-02 VITALS — BP 124/76 | HR 67 | Temp 97.3°F | Ht 68.0 in | Wt 330.6 lb

## 2022-02-02 DIAGNOSIS — I110 Hypertensive heart disease with heart failure: Secondary | ICD-10-CM | POA: Diagnosis not present

## 2022-02-02 DIAGNOSIS — I5032 Chronic diastolic (congestive) heart failure: Secondary | ICD-10-CM | POA: Diagnosis not present

## 2022-02-02 DIAGNOSIS — E876 Hypokalemia: Secondary | ICD-10-CM

## 2022-02-02 DIAGNOSIS — N3000 Acute cystitis without hematuria: Secondary | ICD-10-CM

## 2022-02-02 NOTE — Progress Notes (Signed)
[facility] [address] [place] [address] Dept: [phone] Dept Fax: [phone]  Office Visit  Subjective:    Patient ID: Stacey Page, female    DOB: 12-17-1950, 71 y.o..   MRN: [id number]  Chief Complaint  Patient presents with   Hospitalization [facility] f/u form 02/01/22.  Would like to get blood work to check potasium level      History of Present Illness:  Patient is in today for follow-up form an ED visit yesterday. She was seen at [facility] with a presenting complaint of dyspnea. Stacey Page has a history of chronic diastolic heart failure, pulmonary hypertension, and interstitial lung disease. She is on chronic oxygen. She admits that she ahd also been feeling some recent chills and had a fall at home 2 days ago, when she tripped over a slipper. She had a comprehensive evaluation at the ED. She was discovered to have a UTI. She was treated with fosfomycin as a single-dose treatment. She was found to have an ongoing low potassium and was given 40 meq of potasium chloride.  Today, Stacey Page presents for follow-up with her two daughters present. She admits that she is no longer having chills. She feels her breathing is at her baseline. She has not noted any increased swelling in her ankles. She is on 2 lpm of oxygen. She has concerns abotu her Lasix therapy and asks why no one had told her about the potential for low potassium or put her on potassium replacement until recently. Her daughters asks abotu whether her medicaitons could have caused her UTI and had quesitons about the potential symptoms of a UTI.  Past Medical History: Patient Active Problem List   Diagnosis Date Noted   Hypokalemia 01/26/2022   Mild pulmonary hypertension ([place]) 01/09/2022   Need for influenza vaccination 12/02/2021   Dysfunction of left eustachian tube 12/02/2021   Allergic rhinitis 12/02/2021   Chronic  respiratory failure with hypoxia ([place]) 11/11/2021   Demand ischemia    Respiratory acidosis    Acute respiratory failure with hypercapnia (HCC)    Acute on chronic heart failure with preserved ejection fraction (HCC)    CHF (congestive heart failure) ([place]) 10/09/2021   Reactive airway disease 09/21/2021   Viral syndrome 09/21/2021   Hypertensive retinopathy of right eye, grade 1 08/11/2021   Grade 2 hypertensive retinopathy, left 08/11/2021   Nuclear sclerotic cataract of both eyes 08/11/2021   Need for shingles vaccine 07/14/2021   Iron deficiency anemia 01/13/2021   CKD (chronic kidney disease) stage 4, GFR 15-29 ml/min ([place]) 07/09/2019   Diabetes mellitus type 2 in obese ([place]) 07/02/2019   Polyneuropathy associated with underlying disease ([place]) 11/28/2018   Severe episode of recurrent major depressive disorder, without psychotic features ([place]) 11/28/2018   Type 2 diabetes mellitus with diabetic peripheral angiopathy without gangrene, without long-term current use of insulin ([place]) 11/28/2018   Poor short term memory 11/28/2018   Moderate aortic stenosis by prior echocardiogram 11/06/2018   Diabetes mellitus ([place]) 03/21/2017   Acute renal failure superimposed on stage 2 chronic kidney disease ([place]) 03/17/2016   Symptomatic anemia 03/17/2016   S/P total knee arthroplasty, right 03/17/2016   Unilateral primary osteoarthritis, right knee    Hyperlipidemia associated with type 2 diabetes mellitus ([place]) 12/15/2015   Pain in both lower extremities 11/19/2015   Numbness in both hands 08/13/2015   Panic attack 06/09/2015   Right knee pain 05/14/2015   Chronic renal insufficiency  09/16/2014   Pneumothorax on right    Atypical mycobacterium infection    ILD (interstitial lung disease) 2/2 MAI s/p med rxn    Class 3 severe obesity with serious comorbidity and body mass index (BMI) of 50.0 to 59.9 in adult ([place]) 04/03/2014   OSA (obstructive sleep apnea) 02/25/2014   Hypertensive heart  disease with chronic diastolic congestive heart failure ([place]) 02/25/2014   DOE (dyspnea on exertion) 02/25/2014   Essential hypertension 02/25/2014   Past Surgical History:  Procedure Laterality Date   BREAST BIOPSY Right 03/2018   BREAST LUMPECTOMY WITH RADIOACTIVE SEED LOCALIZATION Right 12/28/2018   Procedure: RIGHT BREAST LUMPECTOMY WITH RADIOACTIVE SEED LOCALIZATION;  Surgeon: Jovita Kussmaul, MD;  Location: [facility];  Service: General;  Laterality: Right;   BREAST SURGERY     CARDIOPULMONARY EXERCISE TEST (CPX)  06/21/2021   (Performed on Coreg 25 mg twice daily): PFTs: FVC 1.76 (60%), FEV1 1.59 (77%), ratio 114% => RESTRICTIVE PHYSIOLOGY; 5 min- 1 mph - 2% grade. 7/10 dyspnea -> pulse ox range during exercise 95 to 98%, low of 90%, 97% on recovery; BP 106/84-160/64; heart rate 64-90 bpm (60% MPHR) -> Chronotropic Incompetence;;MAIN FACTOR for Exercise Limitation = BODY HABITUS w/ Chronotropic Incompetence.   CESAREAN SECTION  x2   COLONOSCOPY     CORONARY CALCIUM SCORE & CT ANGIOGRAM  09/2017   Coronary Ca Score = 11 (low).  CTA- no obstructive CAD (minimal disease)   JOINT REPLACEMENT     KNEE ARTHROSCOPY Right    LUNG BIOPSY Right 06/10/2014   Procedure: LUNG BIOPSY;  Surgeon: Ivin Poot, MD;  Location: [facility];  Service: Thoracic;  Laterality: Right;   POLYPECTOMY     throat   RIGHT HEART CATH N/A 10/15/2021   Procedure: RIGHT HEART CATH;  Surgeon: Belva Crome, MD;  Location: [facility];  Service: CV: Mild Pulmonary Hypertension (WHO Group 3).  Mean PAP 28 mmHg, PCWP 13 mmHg.  PVR 2.73 [place].  RAP 6 mmHg.CO-CI 5.5L/min, 2.17 L/min/m.   TOTAL KNEE ARTHROPLASTY Right 03/14/2016   Procedure: RIGHT TOTAL KNEE ARTHROPLASTY;  Surgeon: Marybelle Killings, MD;  Location: [facility];  Service: Orthopedics;  Laterality: Right;   TRANSTHORACIC ECHOCARDIOGRAM  10/10/2021   EF 70 to 75%.  Hyperdynamic with no RWMA.  Moderate LVH.  Moderate LA dilation indicating likely diastolic dysfunction  although not interpretable.  Moderately elevated PAP with normal RV function, dilated IVC with elevated RAP estimated at 15 mmHg..  Moderate MAC with no MR.  AOV calcification with Moderate AS-mean AvG 23 mmHg.   TRANSTHORACIC ECHOCARDIOGRAM  06/14/2021   EF 60 to 65%.  No RWMA.  Moderate cLVH with GR 1 DD.  Mild LA dilation.  Mild to moderate aortic calcification/stenosis.  Mean AVG 18 mmHg.   VIDEO ASSISTED THORACOSCOPY Right 06/10/2014   Procedure: VIDEO ASSISTED THORACOSCOPY;  Surgeon: Ivin Poot, MD;  Location: [facility] OR;  Service: Thoracic;  Laterality: Right;   Family History  Problem Relation Age of Onset   COPD Father    Hypothyroidism Father    Anemia Father        iron deficiency   High blood pressure Father    Alcoholism Father    Cancer Mother 75       pancreatic   High blood pressure Mother    High Cholesterol Mother    Breast cancer Neg Hx    Outpatient Medications Prior to Visit  Medication Sig Dispense Refill   albuterol (VENTOLIN  HFA) 108 (90 Base) MCG/ACT inhaler INHALE 1-2 PUFFS into THE lungs EVERY 6 HOURS AS NEEDED FOR WHEEZING AND/OR SHORTNESS OF BREATH 8.5 g 3   aspirin 81 MG tablet Take 1 tablet (81 mg total) by mouth daily with breakfast. 90 tablet 0   atorvastatin (LIPITOR) 20 MG tablet Take 1 tablet (20 mg total) by mouth daily. 90 tablet 3   Blood Glucose Monitoring Suppl ([place]) w/Device KIT 1 kit by Does not apply route daily as needed. 1 kit 0   carvedilol (COREG) 25 MG tablet TAKE ONE TABLET BY MOUTH TWICE DAILY 180 tablet 3   cholecalciferol (VITAMIN D3) 25 MCG (1000 UNIT) tablet Take 2,000 Units by mouth daily.     Continuous Blood Gluc Sensor (FREESTYLE LIBRE 14 DAY SENSOR) MISC Place one sensor on the skin every 14 days to monitor blood sugars continuously as directed. 6 each 3   Docusate Sodium 100 MG capsule Take 100 mg by mouth 2 (two) times daily.     ferrous sulfate 325 (65 FE) MG tablet Take 325 mg by mouth 2 (two) times  daily with a meal.     fluticasone (FLONASE) 50 MCG/ACT nasal spray instill ONE SPRAY in each nostril daily (Patient taking differently: Place 1 spray into both nostrils daily.) 16 g 3   furosemide (LASIX) 80 MG tablet Take 1 tablet (80 mg total) by mouth daily. 30 tablet 6   gabapentin (NEURONTIN) 100 MG capsule TAKE ONE CAPSULE BY MOUTH TWICE DAILY 180 capsule 1   glucose blood (ONETOUCH VERIO) test strip Use to monitor blood sugar twice daily as instructed 200 each 3   hydrALAZINE (APRESOLINE) 50 MG tablet Take 1 tablet (50 mg total) by mouth every 8 (eight) hours. 90 tablet 3   isosorbide dinitrate (ISORDIL) 10 MG tablet Take 1 tablet (10 mg total) by mouth 3 (three) times daily. 270 tablet 0   JARDIANCE 10 MG TABS tablet TAKE ONE TABLET BY MOUTH ONCE DAILY BEFORE BREAKFAST 30 tablet 2   montelukast (SINGULAIR) 10 MG tablet TAKE ONE TABLET BY MOUTH EVERYDAY AT BEDTIME 90 tablet 3   OneTouch Delica Lancets 53G MISC Use to monitor blood sugars once daily as directed 100 each 11   pantoprazole (PROTONIX) 40 MG tablet Take 1 tablet (40 mg total) by mouth daily. 90 tablet 1   potassium chloride (KLOR-CON M) 10 MEQ tablet Take 1 tablet (10 mEq total) by mouth daily. 30 tablet 0   No facility-administered medications prior to visit.   No Known Allergies    Objective:   Today's Vitals   02/02/22 1108  BP: 124/76  Pulse: 67  Temp: (!) 97.3 F (36.3 C)  TempSrc: Temporal  SpO2: 98%  Weight: (!) 330 lb 9.6 oz (150 kg)  Height: _0  (1.727 m)   Body mass index is 50.27 kg/m.   General: Well developed, well nourished. No acute distress. Lungs: Clear to auscultation bilaterally. No wheezing, rales or rhonchi. CV: RRR without murmurs or rubs. Pulses 2+ bilaterally. Extremities: No edema noted. Psych: Alert and oriented. Normal mood and affect.  There are no preventive care reminders to display for this patient.    Assessment & Plan:   1. Acute cystitis without hematuria Reviewed ED  notes and lab results. Urine had shown evidence for a UTI. She was treated with a single-dose regimen of fosfomycin. I explained that it may take sverela days for full recovery, but that no further antibiotics were needed.  2. Hypokalemia  Stacey Page has had a mildly low potassium over the past 2 weeks. We discussed how this was a common side effect of the use of diuretics, such as furosemide. I did point out that she had several checks on her potassium over the past few months, which had been normal until recently. Potasium supplementation would not have been indicated until she was found to be low. I recommend she take her potasium chloride 10 meq twice a day for the next week. She has a follow-up scheduled with Dr. Ethelene Hal for 1/2. She should have a follow-up potassium level checked at that time.  3. Hypertensive heart disease with chronic diastolic congestive heart failure ([place]) Compensated. We did discuss that the Jardiance may have contributed to the recent UTI. However, UTIs are common in women and the elderly. This is her first UTI since starting Jardiance in Sept. I recommend she continue on this (apparently it will be changed soon to [place], but the same issues exist for this medication). If UTIs start to occur more frequently, then a decision would need ot be made regarding the risk/benefit of continuing an SGLT2 inhibitor.   Return in about 6 days (around 02/08/2022) for As scheduled.   Haydee Salter, MD

## 2022-02-03 NOTE — Progress Notes (Signed)
Chief Complaint:   OBESITY Stacey Page is here to discuss her progress with her obesity treatment plan along with follow-up of her obesity related diagnoses. Stacey Page is on the Category 2 Plan and states she is following her eating plan approximately 50% of the time. Stacey Page states she is doing leg/chair exercises 10 minutes every other day per week.  Today's visit was #: 56 Starting weight: 344 lbs Starting date: 03/04/2021 Today's weight: 332 lbs Today's date: 01/18/2022 Total lbs lost to date: 12 lbs Total lbs lost since last in-office visit: 3  Interim History: Stacey Page was hospitalized in Sept this year and is working closely with her PCP/family medicine/cardiology/pulmonology/renal for chronic disease management.  Nurse case manager has been working with her to reinforce nutrition plan and working on reading nutrition labels to help her follow her healthy eating plan. Feels hospitalization was a "wake up" and reports trying to improve understanding of disease and compliance with medications and eating plan.  Subjective:   1. Type 2 diabetes mellitus with stage 4 chronic kidney disease, without long-term current use of insulin (HCC) Stacey Page's last A1c was 6.3.  Taking Jardiance 10 mg daily. No side effects with Jardiance. No hypoglycemia. Fasting blood sugars-103-109.  Reports may change to [place] in near future.  2. Chronic diastolic heart failure ([place]) Stacey Page is working closely with cardiology to improve status/avoid fluid overload/acute HF exacerbations/  Working on following prescribed nutrition plan to decrease simple carbohydrates, decrease saturated fat, increase lean proteins and exercise to promote weight loss.to promote weight loss.  3. CKD (chronic kidney disease) stage 4, GFR 15-29 ml/min (HCC) Stacey Page follows regularly with renal physicians and taking Jardiance-likely to change to [employer] soon per last visit with renal MD.  Continue prescribed nutrition  plan to decrease simple carbohydrates, decrease saturated fat, increase lean proteins and exercise to promote weight loss.  Assessment/Plan:   1. Type 2 diabetes mellitus with stage 4 chronic kidney disease, without long-term current use of insulin ([place]) Continue taking Jardiance.Continue healthy eating plan and exercise and chair yoga exercises to promote weight loss.  2. Chronic diastolic heart failure ([place]) Continue medications per cardiology. Follow with cardiology. Continue daily weighing and Lasix dose per cardiology according to dry weight.  3. CKD (chronic kidney disease) stage 4, GFR 15-29 ml/min (HCC) Continue healthy eating plan and exercise and promote weight loss. Follow up with renal.  4. Obesity, current BMI 50.6 Stacey Page is currently in the action stage of change. As such, her goal is to continue with weight loss efforts. She has agreed to the Category 2 Plan.   Exercise goals: Older adults should follow the adult guidelines. When older adults cannot meet the adult guidelines, they should be as physically active as their abilities and conditions will allow.   Increase chair yoga exercises to 10-15 min twice a week.  Behavioral modification strategies: increasing lean protein intake, decreasing simple carbohydrates, meal planning and cooking strategies, keeping healthy foods in the home, and holiday eating strategies .  Stacey Page has agreed to follow-up with our clinic in 4 weeks. She was informed of the importance of frequent follow-up visits to maximize her success with intensive lifestyle modifications for her multiple health conditions.   Objective:   Blood pressure (!) 147/71, pulse 62, temperature 97.9 F (36.6 C), height '5\' 8"'$  (1.727 m), weight (!) 332 lb (150.6 kg), SpO2 91 %. Body mass index is 50.48 kg/m.  General: Cooperative, alert, well developed, in no acute distress. HEENT: Conjunctivae and lids unremarkable.  Cardiovascular: Regular rhythm.  Lungs:  Normal work of breathing. Neurologic: No focal deficits.   Lab Results  Component Value Date   CREATININE 2.37 (H) 02/01/2022   BUN 28 (H) 02/01/2022   NA 137 02/01/2022   K 3.0 (L) 02/01/2022   CL 96 (L) 02/01/2022   CO2 29 02/01/2022   Lab Results  Component Value Date   ALT 9 01/25/2022   AST 17 01/25/2022   ALKPHOS 65 01/25/2022   BILITOT 0.4 01/25/2022   Lab Results  Component Value Date   HGBA1C 6.3 (H) 11/12/2021   HGBA1C 6.0 (A) 07/14/2021   HGBA1C 6.4 (A) 04/14/2021   HGBA1C 6.1 (H) 10/14/2020   HGBA1C 6.1 05/14/2020   Lab Results  Component Value Date   INSULIN 8.6 10/28/2019   INSULIN 9.2 07/04/2019   INSULIN 8.8 03/25/2019   Lab Results  Component Value Date   TSH 0.51 11/28/2018   Lab Results  Component Value Date   CHOL 144 01/14/2022   HDL 57.10 01/14/2022   LDLCALC 73 01/14/2022   TRIG 66.0 01/14/2022   CHOLHDL 3 01/14/2022   Lab Results  Component Value Date   VD25OH 57.6 10/28/2019   VD25OH 41.5 07/04/2019   VD25OH 38.2 03/25/2019   Lab Results  Component Value Date   WBC 9.5 02/01/2022   HGB 12.3 02/01/2022   HCT 38.3 02/01/2022   MCV 90.1 02/01/2022   PLT 287 02/01/2022   Lab Results  Component Value Date   IRON 29 01/25/2022   TIBC 260 01/25/2022   FERRITIN 51 01/25/2022   Attestation Statements:   Reviewed by clinician on day of visit: allergies, medications, problem list, medical history, surgical history, family history, social history, and previous encounter notes.  I, Brendell Tyus, am acting as transcriptionist for [employer], [employer].  I have reviewed the above documentation for accuracy and completeness, and I agree with the above. -  Finn Amos,PA-C

## 2022-02-03 NOTE — Progress Notes (Signed)
Patient returned call and I provided results/recommendations per Rexene Edison NP.  She verbalized understanding.  She will keep her f/u with Dr. Halford Chessman on 1/19 at 4 pm, advised to arrive by 3:45 pm for check in.  Nothing further needed.

## 2022-02-08 ENCOUNTER — Ambulatory Visit (INDEPENDENT_AMBULATORY_CARE_PROVIDER_SITE_OTHER): Payer: PPO | Admitting: Family Medicine

## 2022-02-08 ENCOUNTER — Other Ambulatory Visit (HOSPITAL_COMMUNITY): Payer: Self-pay

## 2022-02-08 ENCOUNTER — Encounter: Payer: Self-pay | Admitting: Family Medicine

## 2022-02-08 VITALS — BP 126/62 | HR 60 | Temp 96.4°F | Ht 68.0 in | Wt 329.2 lb

## 2022-02-08 DIAGNOSIS — E876 Hypokalemia: Secondary | ICD-10-CM | POA: Diagnosis not present

## 2022-02-08 DIAGNOSIS — F341 Dysthymic disorder: Secondary | ICD-10-CM

## 2022-02-08 MED ORDER — POTASSIUM CHLORIDE CRYS ER 10 MEQ PO TBCR
10.0000 meq | EXTENDED_RELEASE_TABLET | Freq: Every day | ORAL | 2 refills | Status: DC
  Filled 2022-02-08: qty 60, 60d supply, fill #0
  Filled 2022-03-21: qty 60, 60d supply, fill #1
  Filled 2022-06-05: qty 60, 60d supply, fill #2

## 2022-02-08 NOTE — Progress Notes (Signed)
Established Patient Office Visit   Subjective:  Patient ID: Stacey Page, female    DOB: 24-Mar-1950  Age: 72 y.o. MRN: [id number]  Chief Complaint  Patient presents with   Office Visit    Potassium f/u       HPI Encounter Diagnoses  Name Primary?   Hypokalemia Yes   Dysthymia    Follow-up of hypokalemia.  Accompanied by her daughter and grandson.  She had a quiet holiday at home.  Status post fall in her bathroom a few weeks ago.  She turned suddenly and fell.  There is no loss of consciousness, spinning sensation or or palpitations appreciated.  She drinks a lot of water after this episode and felt better.  She has been supplementing with potassium 10 mEq twice daily now.  Feels much better.   Review of Systems  Constitutional: Negative.   HENT: Negative.    Eyes:  Negative for blurred vision, discharge and redness.  Respiratory: Negative.    Cardiovascular: Negative.   Gastrointestinal:  Negative for abdominal pain.  Genitourinary: Negative.   Musculoskeletal: Negative.  Negative for myalgias.  Skin:  Negative for rash.  Neurological:  Negative for tingling, loss of consciousness and weakness.  Endo/Heme/Allergies:  Negative for polydipsia.      01/26/2022    9:24 AM 01/13/2022    1:09 PM 12/02/2021    3:17 PM  Depression screen PHQ 2/9  Decreased Interest 0 0 0  Down, Depressed, Hopeless 0 0 0  PHQ - 2 Score 0 0 0      Current Outpatient Medications:    albuterol (VENTOLIN HFA) 108 (90 Base) MCG/ACT inhaler, INHALE 1-2 PUFFS into THE lungs EVERY 6 HOURS AS NEEDED FOR WHEEZING AND/OR SHORTNESS OF BREATH, Disp: 8.5 g, Rfl: 3   aspirin 81 MG tablet, Take 1 tablet (81 mg total) by mouth daily with breakfast., Disp: 90 tablet, Rfl: 0   atorvastatin (LIPITOR) 20 MG tablet, Take 1 tablet (20 mg total) by mouth daily., Disp: 90 tablet, Rfl: 3   Blood Glucose Monitoring Suppl ([place]) w/Device KIT, 1 kit by Does not apply route daily as needed.,  Disp: 1 kit, Rfl: 0   carvedilol (COREG) 25 MG tablet, TAKE ONE TABLET BY MOUTH TWICE DAILY, Disp: 180 tablet, Rfl: 3   cholecalciferol (VITAMIN D3) 25 MCG (1000 UNIT) tablet, Take 2,000 Units by mouth daily., Disp: , Rfl:    Continuous Blood Gluc Sensor (FREESTYLE LIBRE 14 DAY SENSOR) MISC, Place one sensor on the skin every 14 days to monitor blood sugars continuously as directed., Disp: 6 each, Rfl: 3   Docusate Sodium 100 MG capsule, Take 100 mg by mouth 2 (two) times daily., Disp: , Rfl:    ferrous sulfate 325 (65 FE) MG tablet, Take 325 mg by mouth 2 (two) times daily with a meal., Disp: , Rfl:    fluticasone (FLONASE) 50 MCG/ACT nasal spray, instill ONE SPRAY in each nostril daily (Patient taking differently: Place 1 spray into both nostrils daily.), Disp: 16 g, Rfl: 3   furosemide (LASIX) 80 MG tablet, Take 1 tablet (80 mg total) by mouth daily., Disp: 30 tablet, Rfl: 6   gabapentin (NEURONTIN) 100 MG capsule, TAKE ONE CAPSULE BY MOUTH TWICE DAILY, Disp: 180 capsule, Rfl: 1   glucose blood (ONETOUCH VERIO) test strip, Use to monitor blood sugar twice daily as instructed, Disp: 200 each, Rfl: 3   hydrALAZINE (APRESOLINE) 50 MG tablet, Take 1 tablet (50 mg total) by mouth every  8 (eight) hours., Disp: 90 tablet, Rfl: 3   isosorbide dinitrate (ISORDIL) 10 MG tablet, Take 1 tablet (10 mg total) by mouth 3 (three) times daily., Disp: 270 tablet, Rfl: 0   JARDIANCE 10 MG TABS tablet, TAKE ONE TABLET BY MOUTH ONCE DAILY BEFORE BREAKFAST, Disp: 30 tablet, Rfl: 2   montelukast (SINGULAIR) 10 MG tablet, TAKE ONE TABLET BY MOUTH EVERYDAY AT BEDTIME, Disp: 90 tablet, Rfl: 3   OneTouch Delica Lancets 42A MISC, Use to monitor blood sugars once daily as directed, Disp: 100 each, Rfl: 11   pantoprazole (PROTONIX) 40 MG tablet, Take 1 tablet (40 mg total) by mouth daily., Disp: 90 tablet, Rfl: 1   potassium chloride (KLOR-CON M) 10 MEQ tablet, Take 1 tablet (10 mEq total) by mouth daily., Disp: 60 tablet,  Rfl: 2   Objective:     BP 126/62 (BP Location: Right Arm, Patient Position: Sitting, Cuff Size: Normal)   Pulse 60   Temp (!) 96.4 F (35.8 C) (Temporal)   Ht _0  (1.727 m)   Wt (!) 329 lb 3.2 oz (149.3 kg)   SpO2 96%   BMI 50.05 kg/m    Physical Exam Constitutional:      General: She is not in acute distress.    Appearance: Normal appearance. She is not ill-appearing, toxic-appearing or diaphoretic.  HENT:     Head: Normocephalic and atraumatic.     Right Ear: External ear normal.     Left Ear: External ear normal.  Eyes:     General: No scleral icterus.       Right eye: No discharge.        Left eye: No discharge.     Extraocular Movements: Extraocular movements intact.     Conjunctiva/sclera: Conjunctivae normal.  Cardiovascular:     Rate and Rhythm: Normal rate and regular rhythm.  Pulmonary:     Effort: Pulmonary effort is normal. No respiratory distress.     Breath sounds: Normal breath sounds. No wheezing, rhonchi or rales.  Musculoskeletal:     Cervical back: No rigidity or tenderness.     Right lower leg: No edema.     Left lower leg: No edema.  Skin:    General: Skin is warm and dry.  Neurological:     Mental Status: She is alert and oriented to person, place, and time.  Psychiatric:        Mood and Affect: Mood normal.        Behavior: Behavior normal.      No results found for any visits on 02/08/22.    The 10-year ASCVD risk score (Arnett DK, et al., 2019) is: 19.6%    Assessment & Plan:   Hypokalemia -     Basic metabolic panel -     Magnesium -     Potassium Chloride Crys ER; Take 1 tablet (10 mEq total) by mouth daily.  Dispense: 60 tablet; Refill: 2  Dysthymia -     Ambulatory referral to Psychology    Return in about 3 months (around 05/10/2022), or if symptoms worsen or fail to improve.  Daughter requests talking therapy for her mother.  I do not believe that this would be a bad idea.  Continue potassium supplementation.   Explained that we will need to keep a close eye on her potassium regarding her diuretic therapy and CKD.  [name], MD

## 2022-02-09 LAB — BASIC METABOLIC PANEL
BUN: 20 mg/dL (ref 6–23)
CO2: 33 mEq/L — ABNORMAL HIGH (ref 19–32)
Calcium: 9.4 mg/dL (ref 8.4–10.5)
Chloride: 97 mEq/L (ref 96–112)
Creatinine, Ser: 2.05 mg/dL — ABNORMAL HIGH (ref 0.40–1.20)
GFR: 24.17 mL/min — ABNORMAL LOW (ref >60.00)
Glucose, Bld: 133 mg/dL — ABNORMAL HIGH (ref 70–99)
Potassium: 3.7 mEq/L (ref 3.5–5.1)
Sodium: 142 mEq/L (ref 135–145)

## 2022-02-09 LAB — MAGNESIUM: Magnesium: 2 mg/dL (ref 1.5–2.5)

## 2022-02-10 NOTE — Progress Notes (Signed)
TeleHealth Visit:  This visit was completed with telemedicine (audio/video) technology. [name] has verbally consented to this TeleHealth visit. The patient is located at home, the provider is located at home. The participants in this visit include the listed provider and patient. The visit was conducted today via MyChart video.  OBESITY Stacey Page is here to discuss her progress with her obesity treatment plan along with follow-up of her obesity related diagnoses.   Today's visit was # 73 Starting weight: 344 lbs Starting date: 03/04/2021 Weight at last in office visit: 332 lbs on 01/18/22 Total weight loss: 12 lbs at last in office visit on 01/18/22. Today's reported weight:  No weight reported.  Nutrition Plan: the Category 2 Plan.   Interim History:  Stacey Page reports she gained weight over the holidays.  She went to the ER on 02/01/2022 and was diagnosed with hypokalemia (3.0) and UTI.  She was started on potassium.  Potassium was rechecked on 02/08/2022 and was 3.7.  K+ will be checked again in 1 month. She reports she is feeling much better.  She is really focusing on trying to do better with her lifestyle and healthy diet.  She says that she knows that her extra weight contributes to all of her health issues. Had special shoes made.  Fitting was done at her home however the shoes did not fit.  She is trying to get the correct size so she can begin walking. Her grandson lives with her.  She generally eats breakfast and dinner.  Sometimes has an apple or peanut butter/jelly sandwich on Stacey Page delightful bread for lunch.  Her daughter usually brings dinner and they usually have baked chicken with vegetables and rice or potato. He has been drinking Icees and cranberry juice since ER visit. She needs dentures and some proteins are hard for her to chew. She denies polyphagia.  Assessment/Plan:  1. Type II Diabetes with CKD 4 HgbA1c is at goal. Last A1c was 6.3 CBGs: Fasting  111-139 Episodes of hypoglycemia: no Medication(s): Farxiga 10 mg daily Tried to get Ozempic through PAP program this past year and it was denied.  Lab Results  Component Value Date   HGBA1C 6.3 (H) 11/12/2021   HGBA1C 6.0 (A) 07/14/2021   HGBA1C 6.4 (A) 04/14/2021   Lab Results  Component Value Date   MICROALBUR 1.4 05/14/2020   LDLCALC 73 01/14/2022   CREATININE 2.05 (H) 02/08/2022    Plan: Continue Farxiga 10 mg daily.  2. CKD stage 4  Sees Dr. Harrie Jeans [facility]). Farxiga added and Jardiance discontinued.  Last GFR 24 on 02/08/2022. Lab Results  Component Value Date   CREATININE 2.05 (H) 02/08/2022   BUN 20 02/08/2022   NA 142 02/08/2022   K 3.7 02/08/2022   CL 97 02/08/2022   CO2 33 (H) 02/08/2022    Plan: Continue Farxiga. Follow-up with [facility] as directed.  Next appointment in February.  3. Obesity: Current BMI 50.1 Emme is currently in the action stage of change. As such, her goal is to continue with weight loss efforts.  She has agreed to the Category 2 Plan.   1.  Try diet cranberry juice by [employer]. 2.  Stop Icees. 3.  Have peanut butter sandwich for lunch rather than fruit.  Exercise goals: Begin walking for exercise once she gets appropriate shoes.  Behavioral modification strategies: increasing lean protein intake, decreasing simple carbohydrates, decreasing liquid calories, meal planning and cooking strategies, and planning for success.  Stacey Page has agreed to  follow-up with our clinic in 3 weeks.   No orders of the defined types were placed in this encounter.   Medications Discontinued During This Encounter  Medication Reason   JARDIANCE 10 MG TABS tablet Discontinued by provider     No orders of the defined types were placed in this encounter.     Objective:   VITALS: Per patient if applicable, see vitals. GENERAL: Alert and in no acute distress. CARDIOPULMONARY: No increased WOB. Speaking in clear sentences.   PSYCH: Pleasant and cooperative. Speech normal rate and rhythm. Affect is appropriate. Insight and judgement are appropriate. Attention is focused, linear, and appropriate.  NEURO: Oriented as arrived to appointment on time with no prompting.   Lab Results  Component Value Date   CREATININE 2.05 (H) 02/08/2022   BUN 20 02/08/2022   NA 142 02/08/2022   K 3.7 02/08/2022   CL 97 02/08/2022   CO2 33 (H) 02/08/2022   Lab Results  Component Value Date   ALT 9 01/25/2022   AST 17 01/25/2022   ALKPHOS 65 01/25/2022   BILITOT 0.4 01/25/2022   Lab Results  Component Value Date   HGBA1C 6.3 (H) 11/12/2021   HGBA1C 6.0 (A) 07/14/2021   HGBA1C 6.4 (A) 04/14/2021   HGBA1C 6.1 (H) 10/14/2020   HGBA1C 6.1 05/14/2020   Lab Results  Component Value Date   INSULIN 8.6 10/28/2019   INSULIN 9.2 07/04/2019   INSULIN 8.8 03/25/2019   Lab Results  Component Value Date   TSH 0.51 11/28/2018   Lab Results  Component Value Date   CHOL 144 01/14/2022   HDL 57.10 01/14/2022   LDLCALC 73 01/14/2022   TRIG 66.0 01/14/2022   CHOLHDL 3 01/14/2022   Lab Results  Component Value Date   WBC 9.5 02/01/2022   HGB 12.3 02/01/2022   HCT 38.3 02/01/2022   MCV 90.1 02/01/2022   PLT 287 02/01/2022   Lab Results  Component Value Date   IRON 29 01/25/2022   TIBC 260 01/25/2022   FERRITIN 51 01/25/2022   Lab Results  Component Value Date   VD25OH 57.6 10/28/2019   VD25OH 41.5 07/04/2019   VD25OH 38.2 03/25/2019    Attestation Statements:   Reviewed by clinician on day of visit: allergies, medications, problem list, medical history, surgical history, family history, social history, and previous encounter notes.  Time spent on visit including the items listed below was 35 minutes.  -preparing to see the patient (e.g., review of tests, history, previous notes) -obtaining and/or reviewing separately obtained history -counseling and educating the patient/family/caregiver -documenting  clinical information in the electronic or other health record -ordering medications, tests, or procedures -independently interpreting results and communicating results to the patient/ family/caregiver -referring and communicating with other health care professionals  -care coordination

## 2022-02-14 ENCOUNTER — Telehealth (INDEPENDENT_AMBULATORY_CARE_PROVIDER_SITE_OTHER): Payer: Self-pay | Admitting: Family Medicine

## 2022-02-14 ENCOUNTER — Encounter (INDEPENDENT_AMBULATORY_CARE_PROVIDER_SITE_OTHER): Payer: Self-pay | Admitting: Family Medicine

## 2022-02-14 DIAGNOSIS — Z7985 Long-term (current) use of injectable non-insulin antidiabetic drugs: Secondary | ICD-10-CM

## 2022-02-14 DIAGNOSIS — E669 Obesity, unspecified: Secondary | ICD-10-CM

## 2022-02-14 DIAGNOSIS — N184 Chronic kidney disease, stage 4 (severe): Secondary | ICD-10-CM

## 2022-02-14 DIAGNOSIS — Z6841 Body Mass Index (BMI) 40.0 and over, adult: Secondary | ICD-10-CM

## 2022-02-14 DIAGNOSIS — E1122 Type 2 diabetes mellitus with diabetic chronic kidney disease: Secondary | ICD-10-CM

## 2022-02-14 DIAGNOSIS — Z7984 Long term (current) use of oral hypoglycemic drugs: Secondary | ICD-10-CM

## 2022-02-17 ENCOUNTER — Other Ambulatory Visit (HOSPITAL_COMMUNITY): Payer: Self-pay

## 2022-02-17 ENCOUNTER — Ambulatory Visit: Payer: No Typology Code available for payment source

## 2022-02-17 DIAGNOSIS — E1151 Type 2 diabetes mellitus with diabetic peripheral angiopathy without gangrene: Secondary | ICD-10-CM

## 2022-02-17 DIAGNOSIS — I110 Hypertensive heart disease with heart failure: Secondary | ICD-10-CM

## 2022-02-17 MED ORDER — FREESTYLE LIBRE 2 SENSOR MISC
3 refills | Status: DC
  Filled 2022-02-17: qty 6, 84d supply, fill #0

## 2022-02-17 NOTE — Progress Notes (Signed)
Care Management & Coordination Services Pharmacy Note  02/17/2022 Name:  Stacey Page MRN:  [id number] DOB:  [birth year]  Summary: Patient presents for follow-up consult.   Patient is planning to transition away from [facility] [place] due to her insurance  She has started on Farxiga which her nephrologist has helped her get via patient assistance.   Home blood sugars and blood pressures well controlled.   Recommendations/Changes made from today's visit: Continue current medications  Follow up plan: CPP follow-up.    Subjective: Stacey Page is an 72 y.o. year old female who is a primary patient of [name], MD.  The care coordination team was consulted for assistance with disease management and care coordination needs.    Engaged with patient by telephone for follow up visit.  Patient Care Team: [name], MD as PCP - General (Family Medicine) Leonie Man, MD as PCP - Cardiology (Cardiology) Germaine Pomfret, [facility] as Pharmacist (Pharmacist) [place], Tami Lin, [place] as Physician Assistant (Cardiology) Andee Lineman, NP as Nurse Practitioner (Nephrology)  Recent office visits: 02/08/21: Patient presented to Dr. Ethelene Hal for follow-up.  02/02/22: Patient presented to Dr. Gena Fray for hospital follow-up.  01/26/22: Patient presented to Vance Peper, NP  Recent consult visits: 02/14/22: Patient presented to [facility], FNP [facility]). Jardiance stopped.  01/20/22: Patient presented to [facility] (Pulmonology) for follow-up.  12/13/21: Patient presented to Dr. Beckie Salts visits: None in previous 6 months   Objective:  Lab Results  Component Value Date   CREATININE 2.05 (H) 02/08/2022   BUN 20 02/08/2022   GFR 24.17 (L) 02/08/2022   EGFR 34 (L) 11/12/2021   GFRNONAA 22 (L) 02/01/2022   GFRAA 40 (L) 10/28/2019   NA 142 02/08/2022   K 3.7 02/08/2022   CALCIUM 9.4 02/08/2022   CO2 33 (H) 02/08/2022   GLUCOSE 133 (H)  02/08/2022    Lab Results  Component Value Date/Time   HGBA1C 6.3 (H) 11/12/2021 03:08 PM   HGBA1C 6.0 (A) 07/14/2021 11:25 AM   HGBA1C 6.4 (A) 04/14/2021 08:56 AM   HGBA1C 6.1 (H) 10/14/2020 10:57 AM   GFR 24.17 (L) 02/08/2022 02:37 PM   GFR 24.47 (L) 01/14/2022 09:02 AM   MICROALBUR 1.4 05/14/2020 09:10 AM   MICROALBUR 1.9 01/16/2018 11:17 AM    Last diabetic Eye exam:  Lab Results  Component Value Date/Time   HMDIABEYEEXA No Retinopathy 11/05/2020 12:00 AM    Last diabetic Foot exam:  Lab Results  Component Value Date/Time   HMDIABFOOTEX normal 09/10/2018 12:00 AM     Lab Results  Component Value Date   CHOL 144 01/14/2022   HDL 57.10 01/14/2022   LDLCALC 73 01/14/2022   TRIG 66.0 01/14/2022   CHOLHDL 3 01/14/2022       Latest Ref Rng & Units 01/25/2022   11:54 AM 07/14/2021    9:40 AM 06/25/2021   12:00 AM  Hepatic Function  Total Protein 6.5 - 8.1 g/dL 7.9  7.6    Albumin 3.5 - 5.0 g/dL 3.4  3.9  4.1      AST 15 - 41 U/L 17  10    ALT 0 - 44 U/L 9  7    Alk Phosphatase 38 - 126 U/L 65  65    Total Bilirubin 0.3 - 1.2 mg/dL 0.4  0.3       This result is from an external source.    Lab Results  Component Value Date/Time   TSH  0.51 11/28/2018 10:42 AM   TSH 0.81 01/19/2017 10:13 AM       Latest Ref Rng & Units 02/01/2022    4:31 PM 01/25/2022   11:56 AM 10/15/2021    1:04 PM  CBC  WBC 4.0 - 10.5 K/uL 9.5  8.8    Hemoglobin 12.0 - 15.0 g/dL 12.3  12.8  13.3    13.3   Hematocrit 36.0 - 46.0 % 38.3  40.0  39.0    39.0   Platelets 150 - 400 K/uL 287  224      Lab Results  Component Value Date/Time   VD25OH 57.6 10/28/2019 09:31 AM   VD25OH 41.5 07/04/2019 12:40 PM   ZOXWRUEA54 098 10/14/2020 10:57 AM   VITAMINB12 280 11/28/2018 10:42 AM    Clinical ASCVD: No  The 10-year ASCVD risk score (Arnett DK, et al., 2019) is: 19.6%   Values used to calculate the score:     Age: 72 years     Sex: Female     Is Non-Hispanic African American: Yes      Diabetic: Yes     Tobacco smoker: No     Systolic Blood Pressure: 119 mmHg     Is BP treated: Yes     HDL Cholesterol: 57.1 mg/dL     Total Cholesterol: 144 mg/dL       01/26/2022    9:24 AM 01/13/2022    1:09 PM 12/02/2021    3:17 PM  Depression screen PHQ 2/9  Decreased Interest 0 0 0  Down, Depressed, Hopeless 0 0 0  PHQ - 2 Score 0 0 0     Social History   Tobacco Use  Smoking Status Former   Packs/day: 0.25   Years: 15.00   Total pack years: 3.75   Types: Cigarettes   Quit date: 02/08/1980   Years since quitting: 42.0  Smokeless Tobacco Former   BP Readings from Last 3 Encounters:  02/08/22 126/62  02/02/22 124/76  02/01/22 (!) 135/107   Pulse Readings from Last 3 Encounters:  02/08/22 60  02/02/22 67  02/01/22 60   Wt Readings from Last 3 Encounters:  02/08/22 (!) 329 lb 3.2 oz (149.3 kg)  02/02/22 (!) 330 lb 9.6 oz (150 kg)  01/26/22 (!) 338 lb 3.2 oz (153.4 kg)   BMI Readings from Last 3 Encounters:  02/08/22 50.05 kg/m  02/02/22 50.27 kg/m  01/26/22 51.42 kg/m    No Known Allergies  Medications Reviewed Today     Reviewed by Georgianne Fick, FNP (Family Nurse Practitioner) on 02/14/22 at 9  Med List Status: <None>   Medication Order Taking? Sig Documenting Provider Last Dose Status Informant  albuterol (VENTOLIN HFA) 108 (90 Base) MCG/ACT inhaler [id number]  INHALE 1-2 PUFFS into THE lungs EVERY 6 HOURS AS NEEDED FOR WHEEZING AND/OR SHORTNESS OF BREATH [name], [name], NP  Active   aspirin 81 MG tablet [id number]  Take 1 tablet (81 mg total) by mouth daily with breakfast. [name], [name], NP  Active Self  atorvastatin (LIPITOR) 20 MG tablet [id number]  Take 1 tablet (20 mg total) by mouth daily. [name], MD  Active   Blood Glucose Monitoring Suppl ([place]) w/Device KIT [id number]  1 kit by Does not apply route daily as needed. [name], MD  Active Self  carvedilol (COREG) 25 MG tablet  [id number]  TAKE ONE TABLET BY MOUTH TWICE DAILY Leonie Man, MD  Active   cholecalciferol (VITAMIN D3)  25 MCG (1000 UNIT) tablet [id number]  Take 2,000 Units by mouth daily. [provider]  Active Self  Continuous Blood Gluc Sensor (FREESTYLE LIBRE 14 DAY SENSOR) [place] [id number]  Place one sensor on the skin every 14 days to monitor blood sugars continuously as directed. [name], MD  Active Self  dapagliflozin propanediol (FARXIGA) 10 MG TABS tablet [id number] Yes Take 10 mg by mouth daily. [provider] Taking Active   Docusate Sodium 100 MG capsule [id number]  Take 100 mg by mouth 2 (two) times daily. [provider]  Active Self  ferrous sulfate 325 (65 FE) MG tablet [id number]  Take 325 mg by mouth 2 (two) times daily with a meal. [provider]  Active Self  fluticasone (FLONASE) 50 MCG/ACT nasal spray [id number]  instill ONE SPRAY in each nostril daily  Patient taking differently: Place 1 spray into both nostrils daily.   [name], [name], NP  Active Self  furosemide (LASIX) 80 MG tablet [id number]  Take 1 tablet (80 mg total) by mouth daily. Regalado, Belkys A, MD  Active   gabapentin (NEURONTIN) 100 MG capsule [id number]  TAKE ONE CAPSULE BY MOUTH TWICE DAILY [name], [name], NP  Active   glucose blood (ONETOUCH VERIO) test strip [id number]  Use to monitor blood sugar twice daily as instructed [name], MD  Active Self  hydrALAZINE (APRESOLINE) 50 MG tablet [id number]  Take 1 tablet (50 mg total) by mouth every 8 (eight) hours. Regalado, Belkys A, MD  Active   isosorbide dinitrate (ISORDIL) 10 MG tablet [id number]  Take 1 tablet (10 mg total) by mouth 3 (three) times daily. Leonie Man, MD  Active   montelukast (SINGULAIR) 10 MG tablet [id number]  TAKE ONE TABLET BY MOUTH EVERYDAY AT BEDTIME [name], [name], NP  Active   OneTouch Delica Lancets 76E MISC [id number]  Use to monitor blood sugars once daily as directed  [name], MD  Active Self  pantoprazole (PROTONIX) 40 MG tablet [id number]  Take 1 tablet (40 mg total) by mouth daily. [name], MD  Active   potassium chloride (KLOR-CON M) 10 MEQ tablet [id number]  Take 1 tablet (10 mEq total) by mouth daily. [name], MD  Active              Compliance/Adherence/Medication fill history: Care Gaps: None  Star-Rating Drugs: Atorvastatin 20 mg last filled 01/13/22 for 74-DS   SDOH:  (Social Determinants of Health) assessments and interventions performed: Yes SDOH Interventions    Flowsheet Row Clinical Support from 08/17/2021 in [place] Management from 04/29/2021 in [place] Management from 10/21/2020 in [place] from 08/04/2020 in [place] from 07/01/2014 in [place] Interventions Intervention Not Indicated -- Other (Comment)  [referred to care guide for food resource assistance.] Intervention Not Indicated --  Housing Interventions Intervention Not Indicated -- -- Intervention Not Indicated --  Transportation Interventions Intervention Not Indicated -- Intervention Not Indicated -- --  Depression Interventions/Treatment  -- -- -- -- --  [states that she dose not need any treatment for depression at this time]  Financial Strain Interventions Intervention Not Indicated Intervention Not Indicated -- Intervention Not Indicated --  Physical Activity Interventions Intervention Not Indicated -- -- Intervention Not Indicated --  Stress Interventions Intervention Not Indicated -- -- Intervention Not Indicated --  Social Connections Interventions Intervention Not Indicated -- -- Intervention Not Indicated --      SDOH Screenings   Food Insecurity: No Food Insecurity (08/17/2021)  Housing: Low Risk   (08/17/2021)  Transportation Needs: No Transportation Needs (08/17/2021)  Alcohol Screen: Low Risk  (08/17/2021)  Depression (PHQ2-9): Low Risk  (01/26/2022)  Financial Resource Strain: Low Risk  (08/17/2021)  Physical Activity: Insufficiently Active (08/17/2021)  Social Connections: Socially Isolated (08/17/2021)  Stress: No Stress Concern Present (08/17/2021)  Tobacco Use: Medium Risk (02/14/2022)    Medication Assistance:  Wilder [place] obtained through [place].  Enrollment ends NA. Completed by nephrology.  Medication Access: Within the past 30 days, how often has patient missed a dose of medication? None Is a pillbox or other method used to improve adherence? Yes  Factors that may affect medication adherence? financial need Are meds synced by current pharmacy? No  Are meds delivered by current pharmacy? No  Does patient experience delays in picking up medications due to transportation concerns? No   [facility] Reviewed: Is patient disadvantaged to use [facility]?: Yes  Current Rx insurance plan: HTA Name and location of Current pharmacy:  [facility] - [place], [place] - [address] [place] [address] Phone: [phone] Fax: [phone]  [id number] [place] [place]: [phone]: [address]: [phone]: [phone] Reason patient declined to change pharmacies:  Patient was previously enrolled in [place] transitioned due to insurance.   Assessment/Plan  Heart Failure (Goal: manage symptoms and prevent exacerbations) -Controlled -Last ejection fraction: 60-65% (Date: 2021) -HF type: Diastolic -NYHA Class: II (slight limitation of  activity) -Current treatment: Carvedilol 25 mg twice daily: Appropriate, Effective, Safe, Accessible Farxiga 10 mg daily: Appropriate, Effective, Safe, Accessible Furosemide 80 mg daily: Appropriate, Effective, Safe, Accessible  Hydralazine 50 mg three times daily: Appropriate, Effective, Safe, Accessible  Isosorbide dinitrate 20 mg twice daily   -Medications previously tried: NA  -Recommended to continue current medication  Hyperlipidemia: (LDL goal < 70) -Controlled -Current treatment: Atorvastatin 20 mg daily: Appropriate, Effective, Safe, Accessible  -Medications previously tried: NA  -Recommended to continue current medication  Diabetes (A1c goal <7%) -Controlled -Current medications:  Farxiga 10 mg daily: Appropriate, Effective, Safe, Accessible  -Medications previously tried: Ozempic (Cost), Januvia   -Current home glucose readings fasting glucose: 74-135  -Denies hypoglycemic/hyperglycemic symptoms -Patient experiencing false alarms due to low blood sugar on her freestyle libre. Counseled to always verify with traditional glucometer if readings in question. Suspect current sensor may be defective and to replace.  -Recommended to continue current medication  Allergic rhinitis (Goal: Minimize symptoms) -Uncontrolled -Current treatment  Flonase - 2 sprays three times daily: Query Appropriate Montelukast 10 mg daily: Appropriate, Query effective -Medications previously tried: NA -Discussed risks of frequent corticosteroid use.  -Decrease Flonase use to twice daily at most, advised she could utilize saline nasal spray as needed for congestion in between uses.   Chronic Kidney Disease Stage 4  -All medications assessed for renal dosing and appropriateness in chronic kidney disease. -Managed by Dr. FRoyce Macadamia last visit was February 2022.  -Recommended to continue current medication  AJunius Argyle PharmD, BCACP, CPP Clinical Pharmacist Practitioner  [facility]  [facility] [phone]

## 2022-02-18 ENCOUNTER — Other Ambulatory Visit (HOSPITAL_COMMUNITY): Payer: Self-pay

## 2022-02-22 ENCOUNTER — Ambulatory Visit: Payer: PPO | Admitting: Family Medicine

## 2022-02-22 ENCOUNTER — Ambulatory Visit (INDEPENDENT_AMBULATORY_CARE_PROVIDER_SITE_OTHER): Payer: PPO | Admitting: Family Medicine

## 2022-02-22 ENCOUNTER — Ambulatory Visit: Payer: Self-pay

## 2022-02-22 VITALS — BP 146/86 | HR 60 | Temp 97.0°F | Ht 68.0 in | Wt 332.2 lb

## 2022-02-22 DIAGNOSIS — J849 Interstitial pulmonary disease, unspecified: Secondary | ICD-10-CM | POA: Diagnosis not present

## 2022-02-22 DIAGNOSIS — I5033 Acute on chronic diastolic (congestive) heart failure: Secondary | ICD-10-CM | POA: Diagnosis not present

## 2022-02-22 DIAGNOSIS — R0789 Other chest pain: Secondary | ICD-10-CM

## 2022-02-22 DIAGNOSIS — N184 Chronic kidney disease, stage 4 (severe): Secondary | ICD-10-CM | POA: Diagnosis not present

## 2022-02-22 LAB — BRAIN NATRIURETIC PEPTIDE: Pro B Natriuretic peptide (BNP): 84 pg/mL (ref 0.0–100.0)

## 2022-02-22 LAB — BASIC METABOLIC PANEL
BUN: 26 mg/dL — ABNORMAL HIGH (ref 6–23)
CO2: 31 mEq/L (ref 19–32)
Calcium: 9.7 mg/dL (ref 8.4–10.5)
Chloride: 98 mEq/L (ref 96–112)
Creatinine, Ser: 1.94 mg/dL — ABNORMAL HIGH (ref 0.40–1.20)
GFR: 25.82 mL/min — ABNORMAL LOW (ref >60.00)
Glucose, Bld: 112 mg/dL — ABNORMAL HIGH (ref 70–99)
Potassium: 4 mEq/L (ref 3.5–5.1)
Sodium: 139 mEq/L (ref 135–145)

## 2022-02-22 NOTE — Patient Outreach (Signed)
  Care Coordination   Follow Up Visit Note   02/22/2022 Name: Stacey Page MRN: [id number] DOB: 1951-01-16  Stacey Page is a 72 y.o. year old female who sees [name], MD for primary care. I spoke with  [name] by phone today.  What matters to the patients health and wellness today?  I don't feel well.   Goals Addressed             This Visit's Progress    I want to monitor and manage my CHF       Care Coordination Interventions: Provided education on low sodium diet Advised patient to weigh each morning after emptying bladder Discussed importance of daily weight and advised patient to weigh and record daily Reviewed role of diuretics in prevention of fluid overload and management of heart failure; Discussed the importance of keeping all appointments with provider Active listening / Reflection utilized  Emotional Support Provided Problem [place] strategies reviewed Discussed diet This morning, I had a conversation with Stacey Page, who wasn't feeling well. She described the feeling as if an elephant was sitting on her chest. As a result, she went to her physician's office, where she underwent some tests and blood work before being discharged to come home. She mentioned that her condition may have been caused by her recent weight gain and fluid retention. Stacey Page also requested to reschedule our appointment to next week because she didn't feel up to talking today. She's presently awaiting her blood work results.         SDOH assessments and interventions completed:  No     Care Coordination Interventions:  Yes, provided   Follow up plan: Follow up call scheduled for 03/01/22 930 am    Encounter Outcome:  Pt. Visit Completed   Lazaro Arms RN, BSN, [facility]   Phone: [phone]

## 2022-02-22 NOTE — Patient Instructions (Signed)
Take an additional 80 mg of furosemide (Lasix) at 2 pm for the next 2 days. Monitor weight daily.

## 2022-02-22 NOTE — Progress Notes (Signed)
[facility] [address] [place] [address] Dept: [phone] Dept Fax: [phone]  Office Visit  Subjective:    Patient ID: Stacey Page, female    DOB: 21-Jul-1950, 72 y.o..   MRN: [id number]  Chief Complaint  Patient presents with   Acute Visit    C/o having pressure on LT side chest, SOB, sinus drainage x 2 days.  No OTC meds taken       History of Present Illness:  Patient is in today for complaining of a 2-day history of chest pressure/tightness. Ms. Kallen has a history of chronic diastolic heart failure, pulmonary hypertension, and interstitial lung disease. She is on chronic oxygen. She notes she had been feeling quite well over the prior couple of weeks. Since the chest tightness developed 2 days ago, she has noted some nasal congestion/sinus drainage. She does weight herself daily and does admit that she has seen her weight to be up 3 lbs. She has not necessarily noted increased lower leg edema. She did have some diaphoresis last night, but none currently.  Past Medical History: Patient Active Problem List   Diagnosis Date Noted   Dysthymia 02/08/2022   Hypokalemia 01/26/2022   Mild pulmonary hypertension ([place]) 01/09/2022   Need for influenza vaccination 12/02/2021   Dysfunction of left eustachian tube 12/02/2021   Allergic rhinitis 12/02/2021   Chronic respiratory failure with hypoxia ([place]) 11/11/2021   Demand ischemia    Respiratory acidosis    Acute respiratory failure with hypercapnia (HCC)    Acute on chronic heart failure with preserved ejection fraction (HCC)    CHF (congestive heart failure) ([place]) 10/09/2021   Reactive airway disease 09/21/2021   Viral syndrome 09/21/2021   Hypertensive retinopathy of right eye, grade 1 08/11/2021   Grade 2 hypertensive retinopathy, left 08/11/2021   Nuclear sclerotic cataract of both eyes 08/11/2021   Need for shingles vaccine 07/14/2021   Iron deficiency  anemia 01/13/2021   CKD (chronic kidney disease) stage 4, GFR 15-29 ml/min ([place]) 07/09/2019   Diabetes mellitus type 2 in obese ([place]) 07/02/2019   Polyneuropathy associated with underlying disease ([place]) 11/28/2018   Severe episode of recurrent major depressive disorder, without psychotic features ([place]) 11/28/2018   Type 2 diabetes mellitus with diabetic peripheral angiopathy without gangrene, without long-term current use of insulin ([place]) 11/28/2018   Poor short term memory 11/28/2018   Moderate aortic stenosis by prior echocardiogram 11/06/2018   Diabetes mellitus ([place]) 03/21/2017   Acute renal failure superimposed on stage 2 chronic kidney disease ([place]) 03/17/2016   Symptomatic anemia 03/17/2016   S/P total knee arthroplasty, right 03/17/2016   Unilateral primary osteoarthritis, right knee    Hyperlipidemia associated with type 2 diabetes mellitus ([place]) 12/15/2015   Pain in both lower extremities 11/19/2015   Numbness in both hands 08/13/2015   Panic attack 06/09/2015   Right knee pain 05/14/2015   Chronic renal insufficiency 09/16/2014   Pneumothorax on right    Atypical mycobacterium infection    ILD (interstitial lung disease) 2/2 MAI s/p med rxn    Class 3 severe obesity with serious comorbidity and body mass index (BMI) of 50.0 to 59.9 in adult ([place]) 04/03/2014   OSA (obstructive sleep apnea) 02/25/2014   Hypertensive heart disease with chronic diastolic congestive heart failure ([place]) 02/25/2014   DOE (dyspnea on exertion) 02/25/2014   Essential hypertension 02/25/2014   Past Surgical History:  Procedure Laterality Date   BREAST BIOPSY Right 03/2018   BREAST LUMPECTOMY WITH  RADIOACTIVE SEED LOCALIZATION Right 12/28/2018   Procedure: RIGHT BREAST LUMPECTOMY WITH RADIOACTIVE SEED LOCALIZATION;  Surgeon: Jovita Kussmaul, MD;  Location: [facility];  Service: General;  Laterality: Right;   BREAST SURGERY     CARDIOPULMONARY EXERCISE TEST (CPX)  06/21/2021   (Performed on Coreg 25 mg  twice daily): PFTs: FVC 1.76 (60%), FEV1 1.59 (77%), ratio 114% => RESTRICTIVE PHYSIOLOGY; 5 min- 1 mph - 2% grade. 7/10 dyspnea -> pulse ox range during exercise 95 to 98%, low of 90%, 97% on recovery; BP 106/84-160/64; heart rate 64-90 bpm (60% MPHR) -> Chronotropic Incompetence;;MAIN FACTOR for Exercise Limitation = BODY HABITUS w/ Chronotropic Incompetence.   CESAREAN SECTION  x2   COLONOSCOPY     CORONARY CALCIUM SCORE & CT ANGIOGRAM  09/2017   Coronary Ca Score = 11 (low).  CTA- no obstructive CAD (minimal disease)   JOINT REPLACEMENT     KNEE ARTHROSCOPY Right    LUNG BIOPSY Right 06/10/2014   Procedure: LUNG BIOPSY;  Surgeon: Ivin Poot, MD;  Location: [facility];  Service: Thoracic;  Laterality: Right;   POLYPECTOMY     throat   RIGHT HEART CATH N/A 10/15/2021   Procedure: RIGHT HEART CATH;  Surgeon: Belva Crome, MD;  Location: [facility];  Service: CV: Mild Pulmonary Hypertension (WHO Group 3).  Mean PAP 28 mmHg, PCWP 13 mmHg.  PVR 2.73 [place].  RAP 6 mmHg.CO-CI 5.5L/min, 2.17 L/min/m.   TOTAL KNEE ARTHROPLASTY Right 03/14/2016   Procedure: RIGHT TOTAL KNEE ARTHROPLASTY;  Surgeon: Marybelle Killings, MD;  Location: [facility];  Service: Orthopedics;  Laterality: Right;   TRANSTHORACIC ECHOCARDIOGRAM  10/10/2021   EF 70 to 75%.  Hyperdynamic with no RWMA.  Moderate LVH.  Moderate LA dilation indicating likely diastolic dysfunction although not interpretable.  Moderately elevated PAP with normal RV function, dilated IVC with elevated RAP estimated at 15 mmHg..  Moderate MAC with no MR.  AOV calcification with Moderate AS-mean AvG 23 mmHg.   TRANSTHORACIC ECHOCARDIOGRAM  06/14/2021   EF 60 to 65%.  No RWMA.  Moderate cLVH with GR 1 DD.  Mild LA dilation.  Mild to moderate aortic calcification/stenosis.  Mean AVG 18 mmHg.   VIDEO ASSISTED THORACOSCOPY Right 06/10/2014   Procedure: VIDEO ASSISTED THORACOSCOPY;  Surgeon: Ivin Poot, MD;  [facility];  Service: Thoracic;  Laterality:  Right;   Family History  Problem Relation Age of Onset   COPD Father    Hypothyroidism Father    Anemia Father        iron deficiency   High blood pressure Father    Alcoholism Father    Cancer Mother 79       pancreatic   High blood pressure Mother    High Cholesterol Mother    Breast cancer Neg Hx    Outpatient Medications Prior to Visit  Medication Sig Dispense Refill   albuterol (VENTOLIN HFA) 108 (90 Base) MCG/ACT inhaler INHALE 1-2 PUFFS into THE lungs EVERY 6 HOURS AS NEEDED FOR WHEEZING AND/OR SHORTNESS OF BREATH 8.5 g 3   aspirin 81 MG tablet Take 1 tablet (81 mg total) by mouth daily with breakfast. 90 tablet 0   atorvastatin (LIPITOR) 20 MG tablet Take 1 tablet (20 mg total) by mouth daily. 90 tablet 3   Blood Glucose Monitoring Suppl ([place]) w/Device KIT 1 kit by Does not apply route daily as needed. 1 kit 0   carvedilol (COREG) 25 MG tablet TAKE ONE  TABLET BY MOUTH TWICE DAILY 180 tablet 3   cholecalciferol (VITAMIN D3) 25 MCG (1000 UNIT) tablet Take 2,000 Units by mouth daily.     Continuous Blood Gluc Sensor (FREESTYLE LIBRE 2 SENSOR) MISC Place sensor on the arm every 14 days and use to monitor blood sugars continuously as directed. 6 each 3   dapagliflozin propanediol (FARXIGA) 10 MG TABS tablet Take 10 mg by mouth daily.     Docusate Sodium 100 MG capsule Take 100 mg by mouth 2 (two) times daily.     ferrous sulfate 325 (65 FE) MG tablet Take 325 mg by mouth 2 (two) times daily with a meal.     fluticasone (FLONASE) 50 MCG/ACT nasal spray instill ONE SPRAY in each nostril daily (Patient taking differently: Place 1 spray into both nostrils daily.) 16 g 3   furosemide (LASIX) 80 MG tablet Take 1 tablet (80 mg total) by mouth daily. 30 tablet 6   gabapentin (NEURONTIN) 100 MG capsule TAKE ONE CAPSULE BY MOUTH TWICE DAILY 180 capsule 1   glucose blood (ONETOUCH VERIO) test strip Use to monitor blood sugar twice daily as instructed 200 each 3    hydrALAZINE (APRESOLINE) 50 MG tablet Take 1 tablet (50 mg total) by mouth every 8 (eight) hours. 90 tablet 3   isosorbide dinitrate (ISORDIL) 10 MG tablet Take 1 tablet (10 mg total) by mouth 3 (three) times daily. 270 tablet 0   montelukast (SINGULAIR) 10 MG tablet TAKE ONE TABLET BY MOUTH EVERYDAY AT BEDTIME 90 tablet 3   OneTouch Delica Lancets 69G MISC Use to monitor blood sugars once daily as directed 100 each 11   pantoprazole (PROTONIX) 40 MG tablet Take 1 tablet (40 mg total) by mouth daily. 90 tablet 1   potassium chloride (KLOR-CON M) 10 MEQ tablet Take 1 tablet (10 mEq total) by mouth daily. 60 tablet 2   No facility-administered medications prior to visit.   No Known Allergies    Objective:   Today's Vitals   02/22/22 0806  BP: (!) 146/86  Pulse: 60  Temp: (!) 97 F (36.1 C)  TempSrc: Temporal  SpO2: 98%  Weight: (!) 332 lb 3.2 oz (150.7 kg)  Height: _0  (1.727 m)   Body mass index is 50.51 kg/m.   General: Well developed, well nourished. No acute distress. Lungs: Clear to auscultation bilaterally. No wheezing, rales or rhonchi. CV: Slow rate with holosystolic murmur. Pulses 2+ bilaterally. Extremities: Trace edema noted. Psych: Alert and oriented. Normal mood and affect.  There are no preventive care reminders to display for this patient.  EKG: Sinus bradycardia (rate=55). Anterolateral ST elevation appears to be an early repolarization pattern. There are some T-wave changes compared to an EKG from 02/01/2022, however, this looks similar to her EKG from 12/14/2021.  Assessment & Plan:  1. Tightness in chest The etiology of the chest tightness is not clear. Her lung exam is mostly clear. However, she does have an increase in her weight. She could be having some fluid retention related to her heart failure. I do not see acute EKG changes suggestive of an acute cardiac event. Her vital signs are stable. I will check a troponin to assess if something cardiac could  have happened in the past 2 days.  - EKG 12-Lead - Troponin I  2. Acute on chronic heart failure with preserved ejection fraction ([place]) I suspect she may be retaining fluid more currently. We discussed her taking an extra 80 mg dose oif furosemide mid  afternoon for the next 2 days. I will have her follow-up next week for reassessment.  - Basic metabolic panel - B Nat Peptide - Troponin I  3. ILD (interstitial lung disease) 2/2 MAI s/p med rxn Stable. Continue continuous oxygen.  4. CKD (chronic kidney disease) stage 4, GFR 15-29 ml/min (HCC) This could impact her fluid retention. I will reassess renal function today.  - Basic metabolic panel  Return in about 1 week (around 03/01/2022) for Reassessment with PCP or Dr. Gena Fray.   Haydee Salter, MD

## 2022-02-23 LAB — TROPONIN I: Troponin I: 12 ng/L (ref <=47)

## 2022-02-25 ENCOUNTER — Ambulatory Visit (INDEPENDENT_AMBULATORY_CARE_PROVIDER_SITE_OTHER): Payer: PPO | Admitting: Pulmonary Disease

## 2022-02-25 ENCOUNTER — Encounter (HOSPITAL_BASED_OUTPATIENT_CLINIC_OR_DEPARTMENT_OTHER): Payer: Self-pay | Admitting: Pulmonary Disease

## 2022-02-25 VITALS — BP 116/82 | HR 68 | Temp 97.9°F | Ht 68.0 in | Wt 332.0 lb

## 2022-02-25 DIAGNOSIS — Z8619 Personal history of other infectious and parasitic diseases: Secondary | ICD-10-CM

## 2022-02-25 DIAGNOSIS — J9611 Chronic respiratory failure with hypoxia: Secondary | ICD-10-CM | POA: Diagnosis not present

## 2022-02-25 DIAGNOSIS — G4733 Obstructive sleep apnea (adult) (pediatric): Secondary | ICD-10-CM

## 2022-02-25 DIAGNOSIS — J4530 Mild persistent asthma, uncomplicated: Secondary | ICD-10-CM

## 2022-02-25 NOTE — Progress Notes (Signed)
[facility]  Chief Complaint  Patient presents with   Follow-up    Pt states she had chest pains 3 days ago. Pt states she took some lasix and it helped the chest pain,    Constitutional:  BP 116/82 (BP Location: Left Arm, Patient Position: Sitting, Cuff Size: Large)   Pulse 68   Temp 97.9 F (36.6 C) (Oral)   Ht '5\' 8"'$  (1.727 m)   Wt (!) 332 lb (150.6 kg)   SpO2 93%   BMI 50.48 kg/m   Past Medical History:  Anxiety, OA, Diastolic CHF, Aortic stenosis, Depression, DM type 2, Eczema, GERD, HTN, Peripheral neuropathy  Past Surgical History:  She  has a past surgical history that includes Cesarean section (x2); Polypectomy; Knee arthroscopy (Right); Video assisted thoracoscopy (Right, 06/10/2014); Lung biopsy (Right, 06/10/2014); Colonoscopy; Total knee arthroplasty (Right, 03/14/2016); Joint replacement; transthoracic echocardiogram (10/10/2021); CORONARY CALCIUM SCORE & CT ANGIOGRAM (09/2017); Breast lumpectomy with radioactive seed localization (Right, 12/28/2018); Breast surgery; Breast biopsy (Right, 03/2018); transthoracic echocardiogram (06/14/2021); CARDIOPULMONARY EXERCISE TEST (CPX) (06/21/2021); and RIGHT HEART CATH (N/A, 10/15/2021).  Brief Summary:  Stacey Page is a 72 y.o. female former smoker with asthma, and OSA.  She has history of MAI.      Subjective:   She is her with her daughters.  I last saw her in August 2022.  Has seen Tammy Parrett in between.  PFT showed mild restriction and diffusion defect, but essentially stable since 2016.  CT chest shows chronic changes from ILD and MAI infection in 2016 without significant progression.  She uses CPAP nightly.  She hears herself snoring sometimes even when she is using CPAP.  She has build up on her tongue.  She has noticed a hump around the back of her neck.  She feels singulair helps.  Uses albuterol intermittently when she feels tight in her chest and this  helps.  She had a an episode last week of chest pressure and shortness of breath.  She had more swelling also.  She took extra lasix and felt better.  Physical Exam:   Appearance - well kempt, wearing oxygen  ENMT - no sinus tenderness, no oral exudate, no LAN, Mallampati 3 airway, no stridor, white build up on tongue but not posterior pharynx or hard/soft palate  Respiratory - equal breath sounds bilaterally, no wheezing or rales  CV - s1s2 regular rate and rhythm, 2/6 systolic murmur  Ext - no clubbing, no edema  Skin - no rashes  Psych - normal mood and affect    Pulmonary testing:  PFT 03/12/14 >> FEV1 2.05 (88%), FEV1% 87, TLC 4.14 (75%), DLCO 52%, no BD VATS lung bx 06/10/14 >> bronchiolitis with non necrotizing granuloma and AFB PFT 01/30/22 >> FEV1 1.60 (62%), FEV1% 85, TLC 3.83 (69%), DLCO 62%   Chest Imaging:  HRCT chest 03/20/14 >> multiple small nodules, patchy GGO, mild BTX, air trapping Cardiac CT 10/06/17 >> scattered areas of GGO w/o change from 2016 HRCT chest 01/31/22 >> mild fibrosis pattern with relatively heterogeneous, asymmetric distribution of peribronchovascular and subpleural ground-glass, architectural distortion, and scattered areas of subpleural bronchiolectasis conspicuously in the anterior left upper lobe, stable subpleural nodules, air trapping (no change from 2016)  Sleep Tests:  HST 02/12/14 >> AHI 59.3, SaO2 low 67% Auto CPAP 01/25/22 to 02/23/22 >> used on 30 of 30 nights with average 6 hrs 23 min.  Average AHI 7.9 with median CPAP 10 and 95 th percentile CPAP 11  cm H2O   Cardiac Tests:  Echo 10/10/21 >> EF 70 to 75%, mod LVH, mod LA dilation, mod AS   Social History:  She  reports that she quit smoking about 42 years ago. Her smoking use included cigarettes. She has a 3.75 pack-year smoking history. She has quit using smokeless tobacco. She reports current alcohol use. She reports that she does not currently use drugs.  Family History:  Her  family history includes Alcoholism in her father; Anemia in her father; COPD in her father; Cancer (age of onset: 23) in her mother; High Cholesterol in her mother; High blood pressure in her father and mother; Hypothyroidism in her father.     Assessment/Plan:   Obstructive sleep apnea. - she is compliant with CPAP and reports benefit from therapy - she uses [employer] for her DME - current CPAP ordered October 2023 - will have her auto CPAP changed to 7 - 14 cm H2O  Allergic asthma. - continue singulair 10 mg nightly - prn albuterol   Chronic hypoxic respiratory failure. - continue 2 liters oxygen 24/7  Aortic stenosis, chronic diastolic CHF. - followed by Dr. Glenetta Hew with [facility] - suspect much of her recent symptoms are related to valvular heart disease  Build up on tongue. - don't think this is thrush - discussed oral hygiene regimen  Neck hump. - likely related to diabetes  Obesity. - discussed importance of weight loss  Time Spent Involved in Patient Care on Day of Examination:  36 minutes  Follow up:   Patient Instructions  Will have [employer] change your auto CPAP to 7 - 14 cm water pressure  Follow up in 6 months  Medication List:   Allergies as of 02/25/2022   No Known Allergies      Medication List        Accurate as of February 25, 2022  4:47 PM. If you have any questions, ask your nurse or doctor.          albuterol 108 (90 Base) MCG/ACT inhaler Commonly known as: VENTOLIN HFA INHALE 1-2 PUFFS into THE lungs EVERY 6 HOURS AS NEEDED FOR WHEEZING AND/OR SHORTNESS OF BREATH   aspirin EC 81 MG tablet Take 1 tablet (81 mg total) by mouth daily with breakfast.   atorvastatin 20 MG tablet Commonly known as: LIPITOR Take 1 tablet (20 mg total) by mouth daily.   carvedilol 25 MG tablet Commonly known as: COREG TAKE ONE TABLET BY MOUTH TWICE DAILY   cholecalciferol 25 MCG (1000 UNIT) tablet Commonly known as: VITAMIN D3 Take 2,000  Units by mouth daily.   Docusate Sodium 100 MG capsule Take 100 mg by mouth 2 (two) times daily.   Farxiga 10 MG Tabs tablet Generic drug: dapagliflozin propanediol Take 10 mg by mouth daily.   ferrous sulfate 325 (65 FE) MG tablet Take 325 mg by mouth 2 (two) times daily with a meal.   fluticasone 50 MCG/ACT nasal spray Commonly known as: FLONASE instill ONE SPRAY in each nostril daily What changed: See the new instructions.   FreeStyle Libre 2 Sensor Misc Place sensor on the arm every 14 days and use to monitor blood sugars continuously as directed.   furosemide 80 MG tablet Commonly known as: LASIX Take 1 tablet (80 mg total) by mouth daily.   gabapentin 100 MG capsule Commonly known as: NEURONTIN TAKE ONE CAPSULE BY MOUTH TWICE DAILY   hydrALAZINE 50 MG tablet Commonly known as: APRESOLINE Take 1 tablet (50 mg  total) by mouth every 8 (eight) hours.   isosorbide dinitrate 10 MG tablet Commonly known as: ISORDIL Take 1 tablet (10 mg total) by mouth 3 (three) times daily.   montelukast 10 MG tablet Commonly known as: SINGULAIR TAKE ONE TABLET BY MOUTH EVERYDAY AT BEDTIME   OneTouch Delica Lancets 36I Misc Use to monitor blood sugars once daily as directed   [place] w/Device Kit 1 kit by Does not apply route daily as needed.   OneTouch Verio test strip Generic drug: glucose blood Use to monitor blood sugar twice daily as instructed   pantoprazole 40 MG tablet Commonly known as: PROTONIX Take 1 tablet (40 mg total) by mouth daily.   potassium chloride 10 MEQ tablet Commonly known as: KLOR-CON M Take 1 tablet (10 mEq total) by mouth daily.        Signature:  Chesley Mires, MD [place] Pager - [phone] 02/25/2022, 4:47 PM

## 2022-02-25 NOTE — Patient Instructions (Addendum)
Will have [employer] change your auto CPAP to 7 - 14 cm water pressure  Follow up in 6 months

## 2022-03-01 ENCOUNTER — Ambulatory Visit (INDEPENDENT_AMBULATORY_CARE_PROVIDER_SITE_OTHER): Payer: PPO | Admitting: Family Medicine

## 2022-03-01 ENCOUNTER — Encounter: Payer: Self-pay | Admitting: Family Medicine

## 2022-03-01 ENCOUNTER — Ambulatory Visit: Payer: Self-pay

## 2022-03-01 VITALS — BP 122/68 | HR 61 | Temp 97.5°F | Ht 68.0 in | Wt 329.8 lb

## 2022-03-01 DIAGNOSIS — E1122 Type 2 diabetes mellitus with diabetic chronic kidney disease: Secondary | ICD-10-CM | POA: Diagnosis not present

## 2022-03-01 DIAGNOSIS — R0789 Other chest pain: Secondary | ICD-10-CM

## 2022-03-01 DIAGNOSIS — N184 Chronic kidney disease, stage 4 (severe): Secondary | ICD-10-CM | POA: Diagnosis not present

## 2022-03-01 NOTE — Patient Outreach (Signed)
  Care Coordination   Follow Up Visit Note   03/01/2022 Name: Tecora Eustache MRN: [id number] DOB: 1950-05-10  Nyja Westbrook is a 72 y.o. year old female who sees [name], MD for primary care. I spoke with  [name] by phone today.  What matters to the patients health and wellness today?  I feel better    Goals Addressed             This Visit's Progress    I want to monitor and manage my CHF       Care Coordination Interventions: Provided education on low sodium diet Advised patient to weigh each morning after emptying bladder Discussed importance of daily weight and advised patient to weigh and record daily Reviewed role of diuretics in prevention of fluid overload and management of heart failure; Discussed the importance of keeping all appointments with provider Active listening / Reflection utilized  Emotional Support Provided Problem [place] strategies reviewed Discussed diet Mrs. Effie Shy is experiencing a runny nose and watery eyes during the night. She is currently taking Coricidin HP to alleviate her symptoms. For breakfast, she had a bowl of soup, two slices of whole wheat bread, a boiled egg, and a small portion of peanut butter. Her blood pressure was 112/65, and her blood sugar level was 98. She hasn't taken her medications for today yet. I advised her to check her blood sugar level after taking her medications        SDOH assessments and interventions completed:  No     Care Coordination Interventions:  Yes, provided   Follow up plan: Follow up call scheduled for 04/01/22 10 am    Encounter Outcome:  Pt. Visit Completed   Lazaro Arms RN, BSN, [facility]   Phone: [phone]

## 2022-03-01 NOTE — Patient Instructions (Signed)
Visit Information  Thank you for taking time to visit with me today. Please don't hesitate to contact me if I can be of assistance to you.   Following are the goals we discussed today:   Goals Addressed             This Visit's Progress    I want to monitor and manage my CHF       Care Coordination Interventions: Provided education on low sodium diet Advised patient to weigh each morning after emptying bladder Discussed importance of daily weight and advised patient to weigh and record daily Reviewed role of diuretics in prevention of fluid overload and management of heart failure; Discussed the importance of keeping all appointments with provider Active listening / Reflection utilized  Emotional Support Provided Problem [place] strategies reviewed Discussed diet Mrs. Effie Shy is experiencing a runny nose and watery eyes during the night. She is currently taking Coricidin HP to alleviate her symptoms. For breakfast, she had a bowl of soup, two slices of whole wheat bread, a boiled egg, and a small portion of peanut butter. Her blood pressure was 112/65, and her blood sugar level was 98. She hasn't taken her medications for today yet. I advised her to check her blood sugar level after taking her medications        Our next appointment is by telephone on 04/01/22 at 10 am  Please call the care guide team at [phone] if you need to cancel or reschedule your appointment.   If you are experiencing a Mental Health or [place] or need someone to talk to, please call 1-800-273-TALK (toll free, 24 hour hotline)  Patient verbalizes understanding of instructions and care plan provided today and agrees to view in [place]. Active MyChart status and patient understanding of how to access instructions and care plan via MyChart confirmed with patient.     Lazaro Arms RN, BSN, [facility]   Phone: [phone]

## 2022-03-01 NOTE — Progress Notes (Signed)
Established Patient Office Visit   Subjective:  Patient ID: Stacey Page, female    DOB: [birth year]  Age: 72 y.o. MRN: [id number]  Chief Complaint  Patient presents with   Follow-up    Follow up on chest tightness. Per patient symptoms have subsided.     HPI Encounter Diagnoses  Name Primary?   Type 2 diabetes mellitus with stage 4 chronic kidney disease, without long-term current use of insulin (HCC) Yes   Tightness in chest    For follow-up of acute visit last week for chest tightness.  She was advised to take an additional 80 mg of Lasix in the mid afternoon for 3 days.  Her symptoms are relieved.  She no longer feels chest pressure or is experiencing shortness of breath.  Her weight has come back down.  EKG taken at that time showed no acute changes.  Troponin and BNP testing were negative cardiac injury or acute failure.    Review of Systems  Constitutional: Negative.   HENT: Negative.    Eyes:  Negative for blurred vision, discharge and redness.  Respiratory: Negative.  Negative for cough, shortness of breath and wheezing.   Cardiovascular: Negative.  Negative for chest pain.  Gastrointestinal:  Negative for abdominal pain.  Genitourinary: Negative.   Musculoskeletal: Negative.  Negative for myalgias.  Skin:  Negative for rash.  Neurological:  Negative for tingling, loss of consciousness and weakness.  Endo/Heme/Allergies:  Negative for polydipsia.     Current Outpatient Medications:    albuterol (VENTOLIN HFA) 108 (90 Base) MCG/ACT inhaler, INHALE 1-2 PUFFS into THE lungs EVERY 6 HOURS AS NEEDED FOR WHEEZING AND/OR SHORTNESS OF BREATH, Disp: 8.5 g, Rfl: 3   aspirin 81 MG tablet, Take 1 tablet (81 mg total) by mouth daily with breakfast., Disp: 90 tablet, Rfl: 0   atorvastatin (LIPITOR) 20 MG tablet, Take 1 tablet (20 mg total) by mouth daily., Disp: 90 tablet, Rfl: 3   Blood Glucose Monitoring Suppl ([place]) w/Device KIT, 1 kit by Does not  apply route daily as needed., Disp: 1 kit, Rfl: 0   carvedilol (COREG) 25 MG tablet, TAKE ONE TABLET BY MOUTH TWICE DAILY, Disp: 180 tablet, Rfl: 3   cholecalciferol (VITAMIN D3) 25 MCG (1000 UNIT) tablet, Take 2,000 Units by mouth daily., Disp: , Rfl:    Continuous Blood Gluc Sensor (FREESTYLE LIBRE 2 SENSOR) MISC, Place sensor on the arm every 14 days and use to monitor blood sugars continuously as directed., Disp: 6 each, Rfl: 3   dapagliflozin propanediol (FARXIGA) 10 MG TABS tablet, Take 10 mg by mouth daily., Disp: , Rfl:    Docusate Sodium 100 MG capsule, Take 100 mg by mouth 2 (two) times daily., Disp: , Rfl:    ferrous sulfate 325 (65 FE) MG tablet, Take 325 mg by mouth 2 (two) times daily with a meal., Disp: , Rfl:    fluticasone (FLONASE) 50 MCG/ACT nasal spray, instill ONE SPRAY in each nostril daily (Patient taking differently: Place 1 spray into both nostrils daily.), Disp: 16 g, Rfl: 3   furosemide (LASIX) 80 MG tablet, Take 1 tablet (80 mg total) by mouth daily., Disp: 30 tablet, Rfl: 6   gabapentin (NEURONTIN) 100 MG capsule, TAKE ONE CAPSULE BY MOUTH TWICE DAILY, Disp: 180 capsule, Rfl: 1   glucose blood (ONETOUCH VERIO) test strip, Use to monitor blood sugar twice daily as instructed, Disp: 200 each, Rfl: 3   hydrALAZINE (APRESOLINE) 50 MG tablet, Take 1 tablet (50 mg  total) by mouth every 8 (eight) hours., Disp: 90 tablet, Rfl: 3   isosorbide dinitrate (ISORDIL) 10 MG tablet, Take 1 tablet (10 mg total) by mouth 3 (three) times daily., Disp: 270 tablet, Rfl: 0   montelukast (SINGULAIR) 10 MG tablet, TAKE ONE TABLET BY MOUTH EVERYDAY AT BEDTIME, Disp: 90 tablet, Rfl: 3   OneTouch Delica Lancets 44R MISC, Use to monitor blood sugars once daily as directed, Disp: 100 each, Rfl: 11   pantoprazole (PROTONIX) 40 MG tablet, Take 1 tablet (40 mg total) by mouth daily., Disp: 90 tablet, Rfl: 1   potassium chloride (KLOR-CON M) 10 MEQ tablet, Take 1 tablet (10 mEq total) by mouth daily.,  Disp: 60 tablet, Rfl: 2   Objective:     BP 122/68 (BP Location: Left Arm, Patient Position: Sitting, Cuff Size: Large)   Pulse 61   Temp (!) 97.5 F (36.4 C) (Temporal)   Ht '5\' 8"'$  (1.727 m)   Wt (!) 329 lb 12.8 oz (149.6 kg)   SpO2 97%   BMI 50.15 kg/m  Wt Readings from Last 3 Encounters:  03/01/22 (!) 329 lb 12.8 oz (149.6 kg)  02/25/22 (!) 332 lb (150.6 kg)  02/22/22 (!) 332 lb 3.2 oz (150.7 kg)      Physical Exam Constitutional:      General: She is not in acute distress.    Appearance: Normal appearance. She is not ill-appearing, toxic-appearing or diaphoretic.  HENT:     Head: Normocephalic and atraumatic.     Right Ear: External ear normal.     Left Ear: External ear normal.     Mouth/Throat:     Mouth: Mucous membranes are moist.     Pharynx: Oropharynx is clear. No oropharyngeal exudate or posterior oropharyngeal erythema.  Eyes:     General: No scleral icterus.       Right eye: No discharge.        Left eye: No discharge.     Extraocular Movements: Extraocular movements intact.     Conjunctiva/sclera: Conjunctivae normal.     Pupils: Pupils are equal, round, and reactive to light.  Cardiovascular:     Rate and Rhythm: Normal rate and regular rhythm.  Pulmonary:     Effort: Pulmonary effort is normal. No respiratory distress.     Breath sounds: Normal breath sounds. No wheezing or rales.  Abdominal:     General: Bowel sounds are normal.     Tenderness: There is no abdominal tenderness. There is no guarding.  Musculoskeletal:     Cervical back: No rigidity or tenderness.     Right lower leg: No edema.     Left lower leg: No edema.  Skin:    General: Skin is warm and dry.  Neurological:     Mental Status: She is alert and oriented to person, place, and time.  Psychiatric:        Mood and Affect: Mood normal.        Behavior: Behavior normal.      No results found for any visits on 03/01/22.    The 10-year ASCVD risk score (Arnett DK, et al.,  2019) is: 18.5%    Assessment & Plan:   Type 2 diabetes mellitus with stage 4 chronic kidney disease, without long-term current use of insulin (HCC) -     For Home Use Only DME Diabetic Shoe  Tightness in chest    Return in about 3 months (around 05/31/2022), or if symptoms worsen or fail to improve.  Prescription for diabetic shoes given.  Continue 80 mg of Lasix daily as directed by cardiology.  She will be mindful of sodium intake.  [name], MD

## 2022-03-03 ENCOUNTER — Other Ambulatory Visit (HOSPITAL_COMMUNITY): Payer: Self-pay

## 2022-03-07 ENCOUNTER — Ambulatory Visit (INDEPENDENT_AMBULATORY_CARE_PROVIDER_SITE_OTHER): Payer: PPO | Admitting: Adult Health

## 2022-03-07 ENCOUNTER — Encounter (INDEPENDENT_AMBULATORY_CARE_PROVIDER_SITE_OTHER): Payer: Self-pay | Admitting: Adult Health

## 2022-03-07 VITALS — BP 114/50 | HR 56 | Temp 98.0°F | Ht 68.0 in | Wt 325.0 lb

## 2022-03-07 DIAGNOSIS — F4321 Adjustment disorder with depressed mood: Secondary | ICD-10-CM | POA: Diagnosis not present

## 2022-03-07 DIAGNOSIS — E669 Obesity, unspecified: Secondary | ICD-10-CM | POA: Diagnosis not present

## 2022-03-07 DIAGNOSIS — Z7984 Long term (current) use of oral hypoglycemic drugs: Secondary | ICD-10-CM

## 2022-03-07 DIAGNOSIS — Z6841 Body Mass Index (BMI) 40.0 and over, adult: Secondary | ICD-10-CM | POA: Diagnosis not present

## 2022-03-07 DIAGNOSIS — E1122 Type 2 diabetes mellitus with diabetic chronic kidney disease: Secondary | ICD-10-CM | POA: Diagnosis not present

## 2022-03-07 DIAGNOSIS — N184 Chronic kidney disease, stage 4 (severe): Secondary | ICD-10-CM | POA: Diagnosis not present

## 2022-03-14 ENCOUNTER — Other Ambulatory Visit: Payer: Self-pay | Admitting: Nurse Practitioner

## 2022-03-14 ENCOUNTER — Other Ambulatory Visit: Payer: Self-pay | Admitting: Family Medicine

## 2022-03-14 DIAGNOSIS — E876 Hypokalemia: Secondary | ICD-10-CM

## 2022-03-15 ENCOUNTER — Other Ambulatory Visit (HOSPITAL_COMMUNITY): Payer: Self-pay

## 2022-03-15 ENCOUNTER — Telehealth: Payer: Self-pay

## 2022-03-15 MED ORDER — JANUVIA 25 MG PO TABS: 25.0000 mg | ORAL_TABLET | Freq: Every day | ORAL | 0 refills | Status: DC

## 2022-03-15 MED ORDER — GABAPENTIN 100 MG PO CAPS
100.0000 mg | ORAL_CAPSULE | Freq: Two times a day (BID) | ORAL | 1 refills | Status: DC
  Filled 2022-03-15 – 2022-04-27 (×2): qty 134, 67d supply, fill #0

## 2022-03-15 MED ORDER — HYDRALAZINE HCL 50 MG PO TABS
50.0000 mg | ORAL_TABLET | Freq: Three times a day (TID) | ORAL | 0 refills | Status: DC
  Filled 2022-03-15 – 2022-06-16 (×2): qty 90, 30d supply, fill #0

## 2022-03-15 NOTE — Progress Notes (Signed)
Care Management & Coordination Services Pharmacy Team  Reason for Encounter: Medication coordination and delivery  Contacted patient to discuss medications and coordinate delivery from [facility]. Spoke with patient on 03/15/2022   Cycle dispensing form sent to Daron Offer for review.   Last adherence delivery date:01/25/2022      Patient is due for next adherence delivery on: 03/25/2022  This delivery to include: Vials  30 Days  None ID  Patient declined the following medications this month: Patient states she has switch pharmacy due to her insurance coverage, and requesting if [facility] can send her prescriptions they have on file to [facility].Notified [facility] pharmacy and Clinical pharmacist.   Montelukast 10 mg one tablet daily Carvedilol 25 mg one tablet twice daily Gabapentin 100 mg one capsule 2 times daily Hydralazine 50 mg one tablet 3 times daily  Isosorbide dinitrate 20 mg in the morning and evening with Hydralazine 50 mg dose Freestyle Libre 14 day sensor -  Place one sensor on the skin every 14 days  Test Strips  Atorvastatin 20 mg one tablet daily Flonase 50 MCG/ACT nasal spray  Pantoprazole 40 mg 1 tablet daily   Furosemide 40 mg one tablet four times per week  Aspirin 81 mg one tablet daily - OTC  Albuterol 108 MCG/ACT inhaler- PRN Lancets - adequate Supply  Refills requested from providers include: Isosorbide from [facility]   Any concerns about your medications? No  How often do you forget or accidentally miss a dose? Never  Do you use a pillbox? Yes  Is patient in packaging No  If yes  What is the date on your next pill pack?  Any concerns or issues with your packaging?   Recent blood pressure readings are as follows:None ID  Recent blood glucose readings are as follows:None ID   Chart review: Recent office visits:  03/01/2022 Dr. Ethelene Hal MD (PCP) Prescription for diabetic shoes given, return in 3  months 03/01/2022 Lazaro Arms RN (Care Coordination) No Medication changes noted 02/22/2022 Lazaro Arms RN (Care Coordination) No Medication changes noted 02/22/2022 Dr. Gena Fray MD (PCP Office) Take a extra 80 mg dose of furosemide mid afternoon for the next 2 days,Return in about 1 week    Recent consult visits:  03/07/2022 Mina Marble NP ([facility]) No medication changes noted,Return in about 4 weeks  02/25/2022 Dr. Halford Chessman MD (Pulmonology) No medication changes noted, Follow up in 6 months  Hospital visits:  None in previous 6 months  Medications: Outpatient Encounter Medications as of 03/15/2022  Medication Sig   albuterol (VENTOLIN HFA) 108 (90 Base) MCG/ACT inhaler INHALE 1-2 PUFFS into THE lungs EVERY 6 HOURS AS NEEDED FOR WHEEZING AND/OR SHORTNESS OF BREATH   aspirin 81 MG tablet Take 1 tablet (81 mg total) by mouth daily with breakfast.   atorvastatin (LIPITOR) 20 MG tablet Take 1 tablet (20 mg total) by mouth daily.   Blood Glucose Monitoring Suppl ([place]) w/Device KIT 1 kit by Does not apply route daily as needed.   carvedilol (COREG) 25 MG tablet TAKE ONE TABLET BY MOUTH TWICE DAILY   cholecalciferol (VITAMIN D3) 25 MCG (1000 UNIT) tablet Take 2,000 Units by mouth daily.   Continuous Blood Gluc Sensor (FREESTYLE LIBRE 2 SENSOR) MISC Place sensor on the arm every 14 days and use to monitor blood sugars continuously as directed.   dapagliflozin propanediol (FARXIGA) 10 MG TABS tablet Take 10 mg by mouth daily.  Docusate Sodium 100 MG capsule Take 100 mg by mouth 2 (two) times daily.   ferrous sulfate 325 (65 FE) MG tablet Take 325 mg by mouth 2 (two) times daily with a meal.   fluticasone (FLONASE) 50 MCG/ACT nasal spray instill ONE SPRAY in each nostril daily (Patient taking differently: Place 1 spray into both nostrils daily.)   furosemide (LASIX) 80 MG tablet Take 1 tablet (80 mg total) by mouth daily.   gabapentin  (NEURONTIN) 100 MG capsule TAKE ONE CAPSULE BY MOUTH TWICE DAILY   glucose blood (ONETOUCH VERIO) test strip Use to monitor blood sugar twice daily as instructed   hydrALAZINE (APRESOLINE) 50 MG tablet Take 1 tablet (50 mg total) by mouth every 8 (eight) hours.   isosorbide dinitrate (ISORDIL) 10 MG tablet Take 1 tablet (10 mg total) by mouth 3 (three) times daily.   montelukast (SINGULAIR) 10 MG tablet TAKE ONE TABLET BY MOUTH EVERYDAY AT BEDTIME   OneTouch Delica Lancets 99991111 MISC Use to monitor blood sugars once daily as directed   pantoprazole (PROTONIX) 40 MG tablet TAKE ONE TABLET BY MOUTH ONCE DAILY   potassium chloride (KLOR-CON M) 10 MEQ tablet Take 1 tablet (10 mEq total) by mouth daily.   No facility-administered encounter medications on file as of 03/15/2022.   BP Readings from Last 3 Encounters:  03/07/22 (!) 114/50  03/01/22 122/68  02/25/22 116/82    Pulse Readings from Last 3 Encounters:  03/07/22 (!) 56  03/01/22 61  02/25/22 68    Lab Results  Component Value Date/Time   HGBA1C 6.3 (H) 11/12/2021 03:08 PM   HGBA1C 6.0 (A) 07/14/2021 11:25 AM   HGBA1C 6.4 (A) 04/14/2021 08:56 AM   HGBA1C 6.1 (H) 10/14/2020 10:57 AM   Lab Results  Component Value Date   CREATININE 1.94 (H) 02/22/2022   BUN 26 (H) 02/22/2022   GFR 25.82 (L) 02/22/2022   GFRNONAA 22 (L) 02/01/2022   GFRAA 40 (L) 10/28/2019   NA 139 02/22/2022   K 4.0 02/22/2022   CALCIUM 9.7 02/22/2022   CO2 31 02/22/2022     [facility] Clinical Pharmacist Assistant [phone]

## 2022-03-17 ENCOUNTER — Other Ambulatory Visit: Payer: Self-pay | Admitting: Cardiology

## 2022-03-17 DIAGNOSIS — I110 Hypertensive heart disease with heart failure: Secondary | ICD-10-CM | POA: Diagnosis not present

## 2022-03-17 DIAGNOSIS — I10 Essential (primary) hypertension: Secondary | ICD-10-CM | POA: Diagnosis not present

## 2022-03-17 DIAGNOSIS — E785 Hyperlipidemia, unspecified: Secondary | ICD-10-CM | POA: Diagnosis not present

## 2022-03-17 DIAGNOSIS — G8929 Other chronic pain: Secondary | ICD-10-CM | POA: Diagnosis not present

## 2022-03-17 DIAGNOSIS — E1142 Type 2 diabetes mellitus with diabetic polyneuropathy: Secondary | ICD-10-CM | POA: Diagnosis not present

## 2022-03-17 DIAGNOSIS — Z6841 Body Mass Index (BMI) 40.0 and over, adult: Secondary | ICD-10-CM | POA: Diagnosis not present

## 2022-03-17 DIAGNOSIS — J449 Chronic obstructive pulmonary disease, unspecified: Secondary | ICD-10-CM | POA: Diagnosis not present

## 2022-03-17 DIAGNOSIS — E1169 Type 2 diabetes mellitus with other specified complication: Secondary | ICD-10-CM | POA: Diagnosis not present

## 2022-03-17 DIAGNOSIS — I509 Heart failure, unspecified: Secondary | ICD-10-CM | POA: Diagnosis not present

## 2022-03-17 DIAGNOSIS — E261 Secondary hyperaldosteronism: Secondary | ICD-10-CM | POA: Diagnosis not present

## 2022-03-17 DIAGNOSIS — D509 Iron deficiency anemia, unspecified: Secondary | ICD-10-CM | POA: Diagnosis not present

## 2022-03-21 ENCOUNTER — Telehealth: Payer: Self-pay | Admitting: Family Medicine

## 2022-03-21 ENCOUNTER — Other Ambulatory Visit (HOSPITAL_COMMUNITY): Payer: Self-pay

## 2022-03-21 DIAGNOSIS — E1169 Type 2 diabetes mellitus with other specified complication: Secondary | ICD-10-CM

## 2022-03-21 MED ORDER — PANTOPRAZOLE SODIUM 40 MG PO TBEC
40.0000 mg | DELAYED_RELEASE_TABLET | Freq: Every day | ORAL | 1 refills | Status: DC
  Filled 2022-03-21: qty 90, 90d supply, fill #0
  Filled 2022-06-12: qty 90, 90d supply, fill #1

## 2022-03-21 MED ORDER — FUROSEMIDE 80 MG PO TABS
80.0000 mg | ORAL_TABLET | Freq: Every day | ORAL | 6 refills | Status: DC
  Filled 2022-03-21: qty 30, 30d supply, fill #0
  Filled 2022-04-18 – 2022-04-19 (×2): qty 30, 30d supply, fill #1

## 2022-03-21 MED ORDER — ATORVASTATIN CALCIUM 20 MG PO TABS
20.0000 mg | ORAL_TABLET | Freq: Every day | ORAL | 3 refills | Status: DC
  Filled 2022-03-21: qty 90, 90d supply, fill #0
  Filled 2022-07-12 (×2): qty 90, 90d supply, fill #1
  Filled 2022-10-16: qty 90, 90d supply, fill #2
  Filled 2023-01-14: qty 90, 90d supply, fill #3

## 2022-03-21 NOTE — Telephone Encounter (Signed)
Prescription Request  03/21/2022  Is this a "Controlled Substance" medicine? No  LOV: 03/01/2022  What is the name of the medication or equipment? atorvastatin , pantoprazole ,  furosemide   Have you contacted your pharmacy to request a refill? No   Which pharmacy would you like this sent to?  Pharmacy at [facility] , [place] : [phone]  Patient notified that their request is being sent to the clinical staff for review and that they should receive a response within 2 business days.   Please advise at Mobile [phone] (mobile)

## 2022-03-22 ENCOUNTER — Ambulatory Visit: Payer: HMO | Attending: Cardiology | Admitting: Cardiology

## 2022-03-22 ENCOUNTER — Encounter: Payer: Self-pay | Admitting: Cardiology

## 2022-03-22 VITALS — BP 122/66 | HR 70 | Ht 68.0 in | Wt 332.0 lb

## 2022-03-22 DIAGNOSIS — N184 Chronic kidney disease, stage 4 (severe): Secondary | ICD-10-CM | POA: Diagnosis not present

## 2022-03-22 DIAGNOSIS — E785 Hyperlipidemia, unspecified: Secondary | ICD-10-CM | POA: Diagnosis not present

## 2022-03-22 DIAGNOSIS — I5032 Chronic diastolic (congestive) heart failure: Secondary | ICD-10-CM

## 2022-03-22 DIAGNOSIS — I10 Essential (primary) hypertension: Secondary | ICD-10-CM | POA: Diagnosis not present

## 2022-03-22 DIAGNOSIS — E1169 Type 2 diabetes mellitus with other specified complication: Secondary | ICD-10-CM | POA: Diagnosis not present

## 2022-03-22 DIAGNOSIS — Z6841 Body Mass Index (BMI) 40.0 and over, adult: Secondary | ICD-10-CM | POA: Diagnosis not present

## 2022-03-22 DIAGNOSIS — I350 Nonrheumatic aortic (valve) stenosis: Secondary | ICD-10-CM | POA: Diagnosis not present

## 2022-03-22 DIAGNOSIS — G4733 Obstructive sleep apnea (adult) (pediatric): Secondary | ICD-10-CM

## 2022-03-22 DIAGNOSIS — I110 Hypertensive heart disease with heart failure: Secondary | ICD-10-CM | POA: Diagnosis not present

## 2022-03-22 NOTE — Progress Notes (Signed)
Primary Care Provider: [name], [place] Cardiologist: Glenetta Hew, MD Electrophysiologist: None  Clinic Note: Chief Complaint  Patient presents with   Follow-up    Doing well.  Stable   Congestive Heart Failure    Chronic HFpEF combined with ILD, OSA/OHS related pulm hypertension => stable    ===================================  ASSESSMENT/PLAN   Problem List Items Addressed This Visit       Cardiology Problems   Moderate aortic stenosis by prior echocardiogram (Chronic)    Mild to moderate aortic stenosis on echo.  Family to be checked echo next summer.      Hypertensive heart disease with chronic diastolic congestive heart failure ([place]) - Primary (Chronic)    Relatively normal LVEDP and PCWP noted on cardiac catheterization.  This would argue that a good component of her "CHF symptoms are related to primary pulmonary issues as well as obesity/OHS. CPX also indicated restrictive lung physiology is a more prominent reason for dyspnea.  Thankfully, her weights are now stable and she is following her sliding scale dosing.  I still want her to talk with her nephrologist to determine if and when it is safe for [name] to convert from hydralazine/Isordil to an ARB.  Plan: Continue current regimen. Carvedilol 25 mg twice daily Jardiance 10 mg was converted to [place] 10 mg. Hydralazine 50 mg/Isordil 10 mg 3 times daily -> would like to convert to ARB (pending nephrology assessment) Standing dose of 80 mg furosemide with PRN dosing.  She has may be using extra dose up to 3 times a week. Continue CPAP with oxygen      Hyperlipidemia associated with type 2 diabetes mellitus (HCC) (Chronic)   Essential hypertension (Chronic)    BP stable on current meds.  Will hold off on changing for now, but the most prominent change recommended would be to convert from hydralazine/Isordil to ARB.  Defer to nephrology.        Other   OSA (obstructive sleep apnea)  (Chronic)    Continue using CPAP with bleed in oxygen.      Class 3 severe obesity with serious comorbidity and body mass index (BMI) of 50.0 to 59.9 in adult [facility]) (Chronic)    Overall, she needs to lose weight.  Would strongly consider adding GLP-1 agonist for diabetic control and weight loss.  Discussed importance of dietary adjustment and exercise.  She may benefit from health coaching.      CKD (chronic kidney disease) stage 4, GFR 15-29 ml/min (HCC) (Chronic)    She wavers between CKD IIIB to CKD IV.  Follow-up with Dr. Royce Macadamia.  ARB was discontinued because of renal insufficiency, but I think there is a renal protection component as well.  Will defer to Dr. Royce Macadamia is them and that we the best to restart.  Tolerating Farxiga.       ===================================  HPI:    Stacey Page is a 72 y.o. female with a PMH below who presents today for 71-monthfollow-up.  PMH notable for Moderate AS, Hypertensive Heart Disease With Chronic HFpEF superimposed on WHO Class III Pulmonary Hypertension, DM-2, Reactive Airway Disease, OSA on CPAP-OHS, ILD, HLD, CKD 3/4  -> Hospitalized in September 2023 for acute on chronic heart failure due to medication noncompliance  10/10/2021 respiratory failure: Diuresed and underwent RHC showing mild PAH. Mean PAP 28 mmHg (WHO group 3).  Thought to be related to medication nonadherence.  Transition to p.o. Lasix 80 mg daily.  Spironolactone and FWilder Gladestopped because  of  CKD 4  Stacey Page was last seen on 12/13/2021: Feeling much better.  BP much better controlled.  Weights been stable ~333-3 and 34 pounds.  Was back on Jardiance 10 mg, off of Januvia -> Recommend discussing with nephrologist about the benefit of being on ARB over hydralazine/Isordil based on creatinine 1.6.  Recent Hospitalizations:  [facility] 02/01/2022  Reviewed  CV studies:    The following studies were reviewed today: (if available, images/films reviewed: From Epic Chart  or Care Everywhere) 02/01/2022: ER for acute cystitis  Interval History:   Stacey Page returns today overall doing pretty well.  Her blood pressure looks great..  She has not yet seen her nephrologist.  Her blood pressure doing much better.  Her weights are stable. She has some exertional dyspnea, but it is actually better.  She says been getting out and about more.  Try to do more walking and exercise, which is why she has noted a little bit of exertional dyspnea.  No chest pain or pressure.  Trivial swelling.  No real PND or orthopnea but she is using CPAP at night along with nighttime oxygen.  She has rare off-and-on twinges in her chest but not really chest pain.  No rapid irregular heartbeats or palpitations.  No syncope or near syncope.  No TIA or amaurosis fugax.  No claudication.  REVIEWED OF SYSTEMS   Pertinent symptoms noted above Review of Systems  Constitutional:  Positive for malaise/fatigue (Energy level getting better). Negative for weight loss.  HENT:  Negative for congestion.   Respiratory:  Positive for shortness of breath (Baseline). Negative for cough.   Gastrointestinal:  Negative for blood in stool and melena.  Genitourinary:  Negative for hematuria.  Musculoskeletal:  Positive for joint pain. Negative for back pain and falls (No recurrent falls).  Neurological: Negative.   Psychiatric/Behavioral: Negative.      I have reviewed and (if needed) personally updated the patient's problem list, medications, allergies, past medical and surgical history, social and family history.   PAST MEDICAL HISTORY   Past Medical History:  Diagnosis Date   Anxiety    doesn't take any meds   Arthritis of right knee 03/14/2016   Asthma    Back pain    Chronic diastolic CHF (congestive heart failure) (HCC)    HF with Preserved EF (60-65%) - Grade II Diastolic Dysfunction (Hypertensive Heart Disease). takes Furosemide daily   Depression    doesn't take meds   Diabetes ([place])     takes Januvia daily   Eczema    uses cream as needed   GERD (gastroesophageal reflux disease)    History of bronchitis as a child    HTN (hypertension)    Insomnia    Moderate aortic stenosis by prior echocardiogram 04/2017   10/15/2021: Horald Pollen calcification with moderate AS, MG 23 mmHg   Mycobacterium avium-intracellulare infection ([place]) 2016   OA (osteoarthritis)    Obese    OSA (obstructive sleep apnea) 02/25/2014   wears CPAP at night   Peripheral neuropathy    takes Gabapentin as needed   Pneumonia    hx of-2010   Seasonal allergies    uses Flonase daily   Sleep apnea     PAST SURGICAL HISTORY   Past Surgical History:  Procedure Laterality Date   BREAST BIOPSY Right 03/2018   BREAST LUMPECTOMY WITH RADIOACTIVE SEED LOCALIZATION Right 12/28/2018   Procedure: RIGHT BREAST LUMPECTOMY WITH RADIOACTIVE SEED LOCALIZATION;  Surgeon: Jovita Kussmaul, MD;  Location:  [facility] OR;  Service: General;  Laterality: Right;   BREAST SURGERY     CARDIOPULMONARY EXERCISE TEST (CPX)  06/21/2021   (Performed on Coreg 25 mg twice daily): PFTs: FVC 1.76 (60%), FEV1 1.59 (77%), ratio 114% => RESTRICTIVE PHYSIOLOGY; 5 min- 1 mph - 2% grade. 7/10 dyspnea -> pulse ox range during exercise 95 to 98%, low of 90%, 97% on recovery; BP 106/84-160/64; heart rate 64-90 bpm (60% MPHR) -> Chronotropic Incompetence;;MAIN FACTOR for Exercise Limitation = BODY HABITUS w/ Chronotropic Incompetence.   CESAREAN SECTION  x2   COLONOSCOPY     CORONARY CALCIUM SCORE & CT ANGIOGRAM  09/2017   Coronary Ca Score = 11 (low).  CTA- no obstructive CAD (minimal disease)   JOINT REPLACEMENT     KNEE ARTHROSCOPY Right    LUNG BIOPSY Right 06/10/2014   Procedure: LUNG BIOPSY;  Surgeon: Ivin Poot, MD;  Location: [place];  Service: Thoracic;  Laterality: Right;   POLYPECTOMY     throat   RIGHT HEART CATH N/A 10/15/2021   Procedure: RIGHT HEART CATH;  Surgeon: Belva Crome, MD;  Location: [place];  Service: CV:  Mild Pulmonary Hypertension (WHO Group 3).  Mean PAP 28 mmHg, PCWP 13 mmHg.  PVR 2.73 [place].  RAP 6 mmHg.CO-CI 5.5L/min, 2.17 L/min/m.   TOTAL KNEE ARTHROPLASTY Right 03/14/2016   Procedure: RIGHT TOTAL KNEE ARTHROPLASTY;  Surgeon: Marybelle Killings, MD;  Location: [place];  Service: Orthopedics;  Laterality: Right;   TRANSTHORACIC ECHOCARDIOGRAM  10/10/2021   EF 70 to 75%.  Hyperdynamic with no RWMA.  Moderate LVH.  Moderate LA dilation indicating likely diastolic dysfunction although not interpretable.  Moderately elevated PAP with normal RV function, dilated IVC with elevated RAP estimated at 15 mmHg..  Moderate MAC with no MR.  AOV calcification with Moderate AS-mean AvG 23 mmHg.   TRANSTHORACIC ECHOCARDIOGRAM  06/14/2021   EF 60 to 65%.  No RWMA.  Moderate cLVH with GR 1 DD.  Mild LA dilation.  Mild to moderate aortic calcification/stenosis.  Mean AVG 18 mmHg.   VIDEO ASSISTED THORACOSCOPY Right 06/10/2014   Procedure: VIDEO ASSISTED THORACOSCOPY;  Surgeon: Ivin Poot, MD;  [facility] OR;  Service: Thoracic;  Laterality: Right;    MEDICATIONS/ALLERGIES   Current Meds  Medication Sig   albuterol (VENTOLIN HFA) 108 (90 Base) MCG/ACT inhaler INHALE 1-2 PUFFS into THE lungs EVERY 6 HOURS AS NEEDED FOR WHEEZING AND/OR SHORTNESS OF BREATH   aspirin 81 MG tablet Take 1 tablet (81 mg total) by mouth daily with breakfast.   atorvastatin (LIPITOR) 20 MG tablet Take 1 tablet (20 mg total) by mouth daily.   Blood Glucose Monitoring Suppl ([place]) w/Device KIT 1 kit by Does not apply route daily as needed.   carvedilol (COREG) 25 MG tablet TAKE ONE TABLET BY MOUTH TWICE DAILY   cholecalciferol (VITAMIN D3) 25 MCG (1000 UNIT) tablet Take 2,000 Units by mouth daily.   Continuous Blood Gluc Sensor (FREESTYLE LIBRE 2 SENSOR) MISC Place sensor on the arm every 14 days and use to monitor blood sugars continuously as directed.   dapagliflozin propanediol (FARXIGA) 10 MG TABS tablet Take  10 mg by mouth daily.   Docusate Sodium 100 MG capsule Take 100 mg by mouth 2 (two) times daily.   ferrous sulfate 325 (65 FE) MG tablet Take 325 mg by mouth 2 (two) times daily with a meal.   fluticasone (FLONASE) 50 MCG/ACT nasal spray instill ONE SPRAY in  each nostril daily (Patient taking differently: Place 1 spray into both nostrils daily.)   furosemide (LASIX) 80 MG tablet Take 1 tablet (80 mg total) by mouth daily.   gabapentin (NEURONTIN) 100 MG capsule TAKE ONE CAPSULE BY MOUTH TWICE DAILY   gabapentin (NEURONTIN) 100 MG capsule Take 1 capsule (100 mg total) by mouth 2 (two) times daily.   glucose blood (ONETOUCH VERIO) test strip Use to monitor blood sugar twice daily as instructed   hydrALAZINE (APRESOLINE) 50 MG tablet Take 1 tablet (50 mg total) by mouth every 8 (eight) hours.   hydrALAZINE (APRESOLINE) 50 MG tablet Take 1 tablet (50 mg total) by mouth every 8 (eight) hours.   isosorbide dinitrate (ISORDIL) 10 MG tablet Take 1 tablet (10 mg total) by mouth 3 (three) times daily. Please keep scheduled appointment   montelukast (SINGULAIR) 10 MG tablet TAKE ONE TABLET BY MOUTH EVERYDAY AT BEDTIME   OneTouch Delica Lancets 99991111 MISC Use to monitor blood sugars once daily as directed   pantoprazole (PROTONIX) 40 MG tablet Take 1 tablet (40 mg total) by mouth daily.   potassium chloride (KLOR-CON M) 10 MEQ tablet Take 1 tablet (10 mEq total) by mouth daily.    No Known Allergies  SOCIAL HISTORY/FAMILY HISTORY   Reviewed in Epic:  Pertinent findings:  Social History   Tobacco Use   Smoking status: Former    Packs/day: 0.25    Years: 15.00    Total pack years: 3.75    Types: Cigarettes    Quit date: 02/08/1980    Years since quitting: 42.1   Smokeless tobacco: Former  Scientific laboratory technician Use: Never used  Substance Use Topics   Alcohol use: Yes    Alcohol/week: 0.0 standard drinks of alcohol    Comment: occassional/social/rare   Drug use: Not Currently   Social History    Social History Narrative   Not on file    OBJCTIVE -PE, EKG, labs   Wt Readings from Last 3 Encounters:  03/24/22 (!) 331 lb (150.1 kg)  03/22/22 (!) 332 lb (150.6 kg)  03/07/22 (!) 325 lb (147.4 kg)    Physical Exam: BP 122/66   Pulse 70   Ht 5' 8"$  (1.727 m)   Wt (!) 332 lb (150.6 kg)   SpO2 90%   BMI 50.48 kg/m  Physical Exam Vitals reviewed.  Constitutional:      Appearance: Normal appearance. She is obese.  Neck:     Vascular: Normal carotid pulses. No carotid bruit.  Cardiovascular:     Rate and Rhythm: Normal rate and regular rhythm. No extrasystoles are present.    Chest Wall: PMI is not displaced (Unable to palpate).     Pulses: Intact distal pulses. Decreased pulses (Difficult to palpate due to body habitus).     Heart sounds: S1 normal and S2 normal. Heart sounds are distant. Murmur heard.     Harsh crescendo-decrescendo midsystolic murmur is present with a grade of 2/6 at the upper right sternal border radiating to the neck.     No friction rub. Gallop present. S4 sounds present.  Pulmonary:     Effort: Pulmonary effort is normal. No respiratory distress.     Breath sounds: Normal breath sounds. No wheezing, rhonchi or rales.  Chest:     Chest wall: No tenderness.  Musculoskeletal:        General: Swelling (Trivial) present.     Cervical back: Normal range of motion and neck supple.  Skin:  General: Skin is warm and dry.  Neurological:     General: No focal deficit present.     Mental Status: She is alert and oriented to person, place, and time.     Gait: Gait normal.  Psychiatric:        Mood and Affect: Mood normal.        Behavior: Behavior normal.        Thought Content: Thought content normal.        Judgment: Judgment normal.     Adult ECG Report Not checked  Recent Labs: Reviewed Lab Results  Component Value Date   CHOL 144 01/14/2022   HDL 57.10 01/14/2022   LDLCALC 73 01/14/2022   TRIG 66.0 01/14/2022   CHOLHDL 3 01/14/2022    Lab Results  Component Value Date   CREATININE 1.94 (H) 02/22/2022   BUN 26 (H) 02/22/2022   NA 139 02/22/2022   K 4.0 02/22/2022   CL 98 02/22/2022   CO2 31 02/22/2022      Latest Ref Rng & Units 02/01/2022    4:31 PM 01/25/2022   11:56 AM 10/15/2021    1:04 PM  CBC  WBC 4.0 - 10.5 K/uL 9.5  8.8    Hemoglobin 12.0 - 15.0 g/dL 12.3  12.8  13.3    13.3   Hematocrit 36.0 - 46.0 % 38.3  40.0  39.0    39.0   Platelets 150 - 400 K/uL 287  224      Lab Results  Component Value Date   HGBA1C 6.3 (H) 11/12/2021   Lab Results  Component Value Date   TSH 0.51 11/28/2018    ================================================== I spent a total of 22 min with the patient spent in direct patient consultation.  Additional time spent with chart review  / charting (studies, outside notes, etc): 15 min Total Time: 37 min  Current medicines are reviewed at length with the patient today.  (+/- concerns) N/A  Notice: This dictation was prepared with Dragon dictation along with smart phrase technology. Any transcriptional errors that result from this process are unintentional and may not be corrected upon review.  Studies Ordered:   No orders of the defined types were placed in this encounter.  No orders of the defined types were placed in this encounter.   Patient Instructions / Medication Changes & Studies & Tests Ordered   Patient Instructions  Medication Instructions:   No changes   *If you need a refill on your cardiac medications before your next appointment, please call your pharmacy*   Lab Work: Not needed   Testing/Procedures: Not needed   Follow-Up: At [facility], you and your health needs are our priority.  As part of our continuing mission to provide you with exceptional heart care, we have created designated Provider Care Teams.  These Care Teams include your primary Cardiologist (physician) and Advanced Practice Providers (APPs -  Physician Assistants and  Nurse Practitioners) who all work together to provide you with the care you need, when you need it.     Your next appointment:   6 or 7  month(s)  The format for your next appointment:   In Person  Provider:   Glenetta Hew, MD         Leonie Man, MD, MS Glenetta Hew, M.D., M.S. Interventional Cardiologist  [place]  Pager # [phone] Phone # [phone] [address] [place], [place] [address]   Thank you for choosing [place]  at [facility]!!

## 2022-03-22 NOTE — Progress Notes (Signed)
Chief Complaint:   OBESITY Stacey Page is here to discuss her progress with her obesity treatment plan along with follow-up of her obesity related diagnoses. Stacey Page is on the Category 2 Plan and states she is following her eating plan approximately 50% of the time. Stacey Page states she is not currently exercising regularly.  Today's visit was #: 60 Starting weight: 344 lbs Starting date: 03/25/2019 Today's weight: 325 lbs Today's date: 03/22/2022 Total lbs lost to date: 19 lbs Total lbs lost since last in-office visit: - 7 lbs  Interim History:  Stacey Page states "I have been feeling great physically the last 2 weeks!". She continues to wear portable O2- 2LNC day and night.  She has not been exercising, but trying to move around house more.  Subjective:   1. Type 2 diabetes mellitus with stage 4 chronic kidney disease, without long-term current use of insulin (HCC) Lab Results  Component Value Date   HGBA1C 6.3 (H) 11/12/2021   HGBA1C 6.0 (A) 07/14/2021   HGBA1C 6.4 (A) 04/14/2021   She provided the following fasting CBG from home log: 128, 120, 126, 116, 134, 117, 134, 107, 114, 109, 107, 128, 117 02/06/22- Jardiance stopped by Nephrology She is currently on Farxiga 75m QD  2. Grief One of her longest friends in life passed away last week from complications of CHF and dementia. She has scheduled and OV with [place] mental health provider on 04/01/22. Per pt- she has several OVs scheduled. She denies SI/HI. She endorses strong local support system. She is not any mental health medications currently.  Assessment/Plan:   1. Type 2 diabetes mellitus with stage 4 chronic kidney disease, without long-term current use of insulin (HCC) Continue Farxiga 176mQD  2. Grief Increase physical activity and establish with mental health provider.  3. Obesity, current BMI 49.4 Stacey Page is currently in the action stage of change. As such, her goal is to continue with weight  loss efforts. She has agreed to the Category 2 Plan.   Exercise goals: Older adults should do exercises that maintain or improve balance if they are at risk of falling.   -Get up and walk around home EVERY HOUR.  Behavioral modification strategies: increasing lean protein intake, decreasing simple carbohydrates, increasing vegetables, increasing water intake, decreasing sodium intake, no skipping meals, meal planning and cooking strategies, and planning for success.  Stacey Page agreed to follow-up with our clinic in 4 weeks. She was informed of the importance of frequent follow-up visits to maximize her success with intensive lifestyle modifications for her multiple health conditions.   Stacey Page was informed we would discuss her lab results at her next visit unless there is a critical issue that needs to be addressed sooner. Stacey Page agreed to keep her next visit at the agreed upon time to discuss these results.  Objective:   Blood pressure (!) 114/50, pulse (!) 56, temperature 98 F (36.7 C), height 5' 8"$  (1.727 m), weight (!) 325 lb (147.4 kg), SpO2 92 %. Body mass index is 49.42 kg/m.  General: Cooperative, alert, well developed, in no acute distress. HEENT: Conjunctivae and lids unremarkable. Cardiovascular: Regular rhythm.  Lungs: Normal work of breathing. Neurologic: No focal deficits.   Lab Results  Component Value Date   CREATININE 1.94 (H) 02/22/2022   BUN 26 (H) 02/22/2022   NA 139 02/22/2022   K 4.0 02/22/2022   CL 98 02/22/2022   CO2 31 02/22/2022   Lab Results  Component Value Date   ALT 9  01/25/2022   AST 17 01/25/2022   ALKPHOS 65 01/25/2022   BILITOT 0.4 01/25/2022   Lab Results  Component Value Date   HGBA1C 6.3 (H) 11/12/2021   HGBA1C 6.0 (A) 07/14/2021   HGBA1C 6.4 (A) 04/14/2021   HGBA1C 6.1 (H) 10/14/2020   HGBA1C 6.1 05/14/2020   Lab Results  Component Value Date   INSULIN 8.6 10/28/2019   INSULIN 9.2 07/04/2019   INSULIN 8.8 03/25/2019    Lab Results  Component Value Date   TSH 0.51 11/28/2018   Lab Results  Component Value Date   CHOL 144 01/14/2022   HDL 57.10 01/14/2022   LDLCALC 73 01/14/2022   TRIG 66.0 01/14/2022   CHOLHDL 3 01/14/2022   Lab Results  Component Value Date   VD25OH 57.6 10/28/2019   VD25OH 41.5 07/04/2019   VD25OH 38.2 03/25/2019   Lab Results  Component Value Date   WBC 9.5 02/01/2022   HGB 12.3 02/01/2022   HCT 38.3 02/01/2022   MCV 90.1 02/01/2022   PLT 287 02/01/2022   Lab Results  Component Value Date   IRON 29 01/25/2022   TIBC 260 01/25/2022   FERRITIN 51 01/25/2022    Attestation Statements:   Reviewed by clinician on day of visit: allergies, medications, problem list, medical history, surgical history, family history, social history, and previous encounter notes.  Time spent on visit including pre-visit chart review and post-visit care and charting was 27 minutes.    I have reviewed the above documentation for accuracy and completeness, and I agree with the above. -  Stacey Page d. Alyas Creary, NP-C

## 2022-03-22 NOTE — Patient Instructions (Signed)
Medication Instructions:   No changes   *If you need a refill on your cardiac medications before your next appointment, please call your pharmacy*   Lab Work: Not needed   Testing/Procedures: Not needed   Follow-Up: At [facility], you and your health needs are our priority.  As part of our continuing mission to provide you with exceptional heart care, we have created designated Provider Care Teams.  These Care Teams include your primary Cardiologist (physician) and Advanced Practice Providers (APPs -  Physician Assistants and Nurse Practitioners) who all work together to provide you with the care you need, when you need it.     Your next appointment:   6 or 7  month(s)  The format for your next appointment:   In Person  Provider:   Glenetta Hew, MD

## 2022-03-22 NOTE — Telephone Encounter (Signed)
error 

## 2022-03-24 ENCOUNTER — Encounter: Payer: Self-pay | Admitting: Family Medicine

## 2022-03-24 ENCOUNTER — Other Ambulatory Visit (HOSPITAL_COMMUNITY): Payer: Self-pay

## 2022-03-24 ENCOUNTER — Ambulatory Visit (INDEPENDENT_AMBULATORY_CARE_PROVIDER_SITE_OTHER): Payer: HMO | Admitting: Family Medicine

## 2022-03-24 VITALS — BP 140/76 | HR 61 | Temp 97.9°F | Ht 68.0 in | Wt 331.0 lb

## 2022-03-24 DIAGNOSIS — N184 Chronic kidney disease, stage 4 (severe): Secondary | ICD-10-CM | POA: Diagnosis not present

## 2022-03-24 DIAGNOSIS — E1122 Type 2 diabetes mellitus with diabetic chronic kidney disease: Secondary | ICD-10-CM

## 2022-03-24 NOTE — Progress Notes (Signed)
Established Patient Office Visit   Subjective:  Patient ID: Stacey Page, female    DOB: [birth year]  Age: 72 y.o. MRN: [id number]  Chief Complaint  Patient presents with   Hypoglycemia    Glucose levels running low x 2 weeks.     Hypoglycemia Pertinent negatives include no abdominal pain, myalgias, rash or weakness.   Encounter Diagnoses  Name Primary?   Type 2 diabetes mellitus with stage 4 chronic kidney disease, without long-term current use of insulin ([place]) Yes   Feels great but has been experiencing some low glucose readings in the wee hours and in the morning.  Sugars have been down to 69 per freestyle.  Fingersticks have been in the 100-1 30 range.  Continues with 10 mg of Farxiga.  Medication is important to her for treatment of CHF and CKD.  Blood pressures at home have been in the 120s over 60s.   Review of Systems  Constitutional: Negative.   HENT: Negative.    Eyes:  Negative for blurred vision, discharge and redness.  Respiratory: Negative.    Cardiovascular: Negative.   Gastrointestinal:  Negative for abdominal pain.  Genitourinary: Negative.   Musculoskeletal: Negative.  Negative for myalgias.  Skin:  Negative for rash.  Neurological:  Negative for tingling, loss of consciousness and weakness.  Endo/Heme/Allergies:  Negative for polydipsia.     Current Outpatient Medications:    albuterol (VENTOLIN HFA) 108 (90 Base) MCG/ACT inhaler, INHALE 1-2 PUFFS into THE lungs EVERY 6 HOURS AS NEEDED FOR WHEEZING AND/OR SHORTNESS OF BREATH, Disp: 8.5 g, Rfl: 3   aspirin 81 MG tablet, Take 1 tablet (81 mg total) by mouth daily with breakfast., Disp: 90 tablet, Rfl: 0   atorvastatin (LIPITOR) 20 MG tablet, Take 1 tablet (20 mg total) by mouth daily., Disp: 90 tablet, Rfl: 3   Blood Glucose Monitoring Suppl (Orange) w/Device KIT, 1 kit by Does not apply route daily as needed., Disp: 1 kit, Rfl: 0   carvedilol (COREG) 25 MG tablet, TAKE ONE TABLET BY  MOUTH TWICE DAILY, Disp: 180 tablet, Rfl: 3   cholecalciferol (VITAMIN D3) 25 MCG (1000 UNIT) tablet, Take 2,000 Units by mouth daily., Disp: , Rfl:    Continuous Blood Gluc Sensor (FREESTYLE LIBRE 2 SENSOR) MISC, Place sensor on the arm every 14 days and use to monitor blood sugars continuously as directed., Disp: 6 each, Rfl: 3   dapagliflozin propanediol (FARXIGA) 10 MG TABS tablet, Take 10 mg by mouth daily., Disp: , Rfl:    Docusate Sodium 100 MG capsule, Take 100 mg by mouth 2 (two) times daily., Disp: , Rfl:    ferrous sulfate 325 (65 FE) MG tablet, Take 325 mg by mouth 2 (two) times daily with a meal., Disp: , Rfl:    fluticasone (FLONASE) 50 MCG/ACT nasal spray, instill ONE SPRAY in each nostril daily (Patient taking differently: Place 1 spray into both nostrils daily.), Disp: 16 g, Rfl: 3   furosemide (LASIX) 80 MG tablet, Take 1 tablet (80 mg total) by mouth daily., Disp: 30 tablet, Rfl: 6   gabapentin (NEURONTIN) 100 MG capsule, TAKE ONE CAPSULE BY MOUTH TWICE DAILY, Disp: 180 capsule, Rfl: 1   gabapentin (NEURONTIN) 100 MG capsule, Take 1 capsule (100 mg total) by mouth 2 (two) times daily., Disp: 180 capsule, Rfl: 1   glucose blood (ONETOUCH VERIO) test strip, Use to monitor blood sugar twice daily as instructed, Disp: 200 each, Rfl: 3   hydrALAZINE (APRESOLINE) 50 MG tablet,  Take 1 tablet (50 mg total) by mouth every 8 (eight) hours., Disp: 90 tablet, Rfl: 3   hydrALAZINE (APRESOLINE) 50 MG tablet, Take 1 tablet (50 mg total) by mouth every 8 (eight) hours., Disp: 90 tablet, Rfl: 0   isosorbide dinitrate (ISORDIL) 10 MG tablet, Take 1 tablet (10 mg total) by mouth 3 (three) times daily. Please keep scheduled appointment, Disp: 90 tablet, Rfl: 1   montelukast (SINGULAIR) 10 MG tablet, TAKE ONE TABLET BY MOUTH EVERYDAY AT BEDTIME, Disp: 90 tablet, Rfl: 3   OneTouch Delica Lancets 99991111 MISC, Use to monitor blood sugars once daily as directed, Disp: 100 each, Rfl: 11   pantoprazole  (PROTONIX) 40 MG tablet, Take 1 tablet (40 mg total) by mouth daily., Disp: 90 tablet, Rfl: 1   potassium chloride (KLOR-CON M) 10 MEQ tablet, Take 1 tablet (10 mEq total) by mouth daily., Disp: 60 tablet, Rfl: 2   Objective:     BP (!) 140/76 (BP Location: Right Arm, Patient Position: Sitting, Cuff Size: Large)   Pulse 61   Temp 97.9 F (36.6 C) (Temporal)   Ht 5' 8"$  (1.727 m)   Wt (!) 331 lb (150.1 kg)   SpO2 97%   BMI 50.33 kg/m    Physical Exam Constitutional:      General: She is not in acute distress.    Appearance: Normal appearance. She is not ill-appearing, toxic-appearing or diaphoretic.  HENT:     Head: Normocephalic and atraumatic.     Right Ear: External ear normal.     Left Ear: External ear normal.  Eyes:     General: No scleral icterus.       Right eye: No discharge.        Left eye: No discharge.     Extraocular Movements: Extraocular movements intact.     Conjunctiva/sclera: Conjunctivae normal.  Pulmonary:     Effort: Pulmonary effort is normal. No respiratory distress.  Skin:    General: Skin is warm and dry.  Neurological:     Mental Status: She is alert and oriented to person, place, and time.  Psychiatric:        Mood and Affect: Mood normal.        Behavior: Behavior normal.      No results found for any visits on 03/24/22.    The 10-year ASCVD risk score (Arnett DK, et al., 2019) is: 23.5%    Assessment & Plan:   Type 2 diabetes mellitus with stage 4 chronic kidney disease, without long-term current use of insulin (HCC)    Return in about 4 weeks (around 04/21/2022), or Continue Farxiga 10 mg for now..  She has glucose tabs.  For now she will take a 15 mg glucose tab when her sugars drop under 90 and wait 15 minutes before taking another.  She has follow-up scheduled with nephrology in a few weeks.  Suggested the possibility of decreasing the Farxiga dosage to 5 mg.  Seeking nephrology's opinion on this.  CHF and CKD have been mostly  stable on the 10 mg dosage.   [name], MD

## 2022-03-25 ENCOUNTER — Other Ambulatory Visit (HOSPITAL_COMMUNITY): Payer: Self-pay

## 2022-03-25 ENCOUNTER — Ambulatory Visit (INDEPENDENT_AMBULATORY_CARE_PROVIDER_SITE_OTHER): Payer: HMO | Admitting: Podiatry

## 2022-03-25 ENCOUNTER — Encounter: Payer: Self-pay | Admitting: Podiatry

## 2022-03-25 VITALS — BP 133/53

## 2022-03-25 DIAGNOSIS — B351 Tinea unguium: Secondary | ICD-10-CM | POA: Diagnosis not present

## 2022-03-25 DIAGNOSIS — M79674 Pain in right toe(s): Secondary | ICD-10-CM

## 2022-03-25 DIAGNOSIS — M79675 Pain in left toe(s): Secondary | ICD-10-CM

## 2022-03-25 DIAGNOSIS — E1142 Type 2 diabetes mellitus with diabetic polyneuropathy: Secondary | ICD-10-CM | POA: Diagnosis not present

## 2022-03-25 NOTE — Progress Notes (Unsigned)
  Subjective:  Patient ID: Stacey Page, female    DOB: 1950/08/18,  MRN: [id number]  Braleigh Foore presents to clinic today for {jgcomplaint:23593}  Chief Complaint  Patient presents with   Nail Problem    [facility] BS-113 A1C-6.1 PCP-[place] PCP VST-03/24/2022   New problem(s): None. {jgcomplaint:23593}  PCP is [name], MD.  No Known Allergies  Review of Systems: Negative except as noted in the HPI.  Objective: No changes noted in today's physical examination. Vitals:   03/25/22 1109  BP: (!) 133/53   Stacey Page is a pleasant 72 y.o. female morbidly obese in NAD. AAO x 3.  Vascular Examination: CFT <3 seconds b/l. DP/PT pulses faintly palpable b/l. Skin temperature gradient warm to warm b/l. No ischemia or gangrene. No cyanosis or clubbing noted b/l. Pedal hair present.    Neurological Examination: Pt has subjective symptoms of neuropathy. Vibratory sensation intact b/l. Sensation grossly intact b/l with 10 gram monofilament. Vibratory sensation intact b/l.    Dermatological Examination: Pedal integument with normal turgor, texture and tone b/l LE. No open wounds b/l. No interdigital macerations b/l.   Toenails 1-5 b/l elongated, thickened, discolored with subungual debris. +Tenderness with dorsal palpation of nailplates.   Incurvated nailplate both borders of left hallux and both borders of right hallux.  Nail border hypertrophy absent. There is tenderness to palpation. Sign(s) of infection: no clinical signs of infection noted on examination today.Marland Kitchen  Resolved hyperkeratotic lesion(s) bilateral heels.     Musculoskeletal Examination: Muscle strength 5/5 to b/l LE. Hammertoe deformity noted 2-5 b/l. Pes planus deformity noted bilateral LE.   Radiographs: None Assessment/Plan: No diagnosis found.  No orders of the defined types were placed in this encounter.   None {Jgplan:23602::"-Patient/POA to call should there be question/concern in the  interim."}   Return in about 9 weeks (around 05/27/2022).  Marzetta Board, DPM

## 2022-03-28 DIAGNOSIS — H2513 Age-related nuclear cataract, bilateral: Secondary | ICD-10-CM | POA: Diagnosis not present

## 2022-03-28 DIAGNOSIS — H05033 Periostitis of bilateral orbits: Secondary | ICD-10-CM | POA: Diagnosis not present

## 2022-03-28 DIAGNOSIS — E119 Type 2 diabetes mellitus without complications: Secondary | ICD-10-CM | POA: Diagnosis not present

## 2022-03-28 LAB — HM DIABETES EYE EXAM

## 2022-03-29 DIAGNOSIS — N184 Chronic kidney disease, stage 4 (severe): Secondary | ICD-10-CM | POA: Diagnosis not present

## 2022-03-31 ENCOUNTER — Encounter: Payer: Self-pay | Admitting: Cardiology

## 2022-03-31 NOTE — Assessment & Plan Note (Addendum)
She wavers between CKD IIIB to CKD IV.  Follow-up with Dr. Royce Macadamia.  ARB was discontinued because of renal insufficiency, but I think there is a renal protection component as well.  Will defer to Dr. Royce Macadamia is them and that we the best to restart.  Tolerating Wilder Glade.

## 2022-03-31 NOTE — Assessment & Plan Note (Signed)
BP stable on current meds.  Will hold off on changing for now, but the most prominent change recommended would be to convert from hydralazine/Isordil to ARB.  Defer to nephrology.

## 2022-03-31 NOTE — Assessment & Plan Note (Signed)
Overall, she needs to lose weight.  Would strongly consider adding GLP-1 agonist for diabetic control and weight loss.  Discussed importance of dietary adjustment and exercise.  She may benefit from health coaching.

## 2022-03-31 NOTE — Assessment & Plan Note (Signed)
Relatively normal LVEDP and PCWP noted on cardiac catheterization.  This would argue that a good component of her "CHF symptoms are related to primary pulmonary issues as well as obesity/OHS. CPX also indicated restrictive lung physiology is a more prominent reason for dyspnea.  Thankfully, her weights are now stable and she is following her sliding scale dosing.  I still want her to talk with her nephrologist to determine if and when it is safe for [name] to convert from hydralazine/Isordil to an ARB.  Plan: Continue current regimen. Carvedilol 25 mg twice daily Jardiance 10 mg was converted to [place] 10 mg. Hydralazine 50 mg/Isordil 10 mg 3 times daily -> would like to convert to ARB (pending nephrology assessment) Standing dose of 80 mg furosemide with PRN dosing.  She has may be using extra dose up to 3 times a week. Continue CPAP with oxygen

## 2022-03-31 NOTE — Assessment & Plan Note (Signed)
Mild to moderate aortic stenosis on echo.  Family to be checked echo next summer.

## 2022-03-31 NOTE — Assessment & Plan Note (Signed)
Continue using CPAP with bleed in oxygen.

## 2022-04-01 ENCOUNTER — Ambulatory Visit: Payer: Self-pay

## 2022-04-01 NOTE — Patient Instructions (Signed)
Visit Information  Thank you for taking time to visit with me today. Please don't hesitate to contact me if I can be of assistance to you.   Following are the goals we discussed today:   Goals Addressed             This Visit's Progress    I want to monitor and manage my CHF       Patient Goals/Self Care Activities: -Patient/Caregiver will self-administer medications as prescribed as evidenced by self-report/primary caregiver report  -Patient/Caregiver will attend all scheduled provider appointments as evidenced by clinician review of documented attendance to scheduled appointments and patient/caregiver report -Patient/Caregiver will call pharmacy for medication refills as evidenced by patient report and review of pharmacy fill history as appropriate -Patient/Caregiver will call provider office for new concerns or questions as evidenced by review of documented incoming telephone call notes and patient report -Patient/Caregiver verbalizes understanding of plan -Patient/Caregiver will focus on medication adherence by taking medications as prescribed  -Weigh daily and record (notify MD with 3 lb weight gain over night or 5 lb in a week) -Follow CHF Action Plan -Adhere to low sodium diet  -Continue with light exercise         Our next appointment is by telephone on 05/03/22 at 830 am  Please call the care guide team at [phone] if you need to cancel or reschedule your appointment.   If you are experiencing a Mental Health or [place] or need someone to talk to, please call 1-800-273-TALK (toll free, 24 hour hotline)  Patient verbalizes understanding of instructions and care plan provided today and agrees to view in [facility]. Active MyChart status and patient understanding of how to access instructions and care plan via MyChart confirmed with patient.     Lazaro Arms RN, BSN, [facility]   Phone: [phone]

## 2022-04-01 NOTE — Patient Outreach (Signed)
  Care Coordination   Follow Up Visit Note   04/01/2022 Name: Stacey Page MRN: [id number] DOB: 1950-12-11  Stacey Page is a 72 y.o. year old female who sees Stacey Maw, MD for primary care. I spoke with  Stacey Page by phone today.  What matters to the patients health and wellness today?  According to Stacey Page, she is feeling good today and has not experienced any chest pain, shortness of breath, or swelling. Her weight has remained the same and she has been trying out new things for her diet, which seems to be helping. She also performs chair exercises to facilitate light movement.    Goals Addressed             This Visit's Progress    I want to monitor and manage my CHF       Patient Goals/Self Care Activities: -Patient/Caregiver will self-administer medications as prescribed as evidenced by self-report/primary caregiver report  -Patient/Caregiver will attend all scheduled provider appointments as evidenced by clinician review of documented attendance to scheduled appointments and patient/caregiver report -Patient/Caregiver will call pharmacy for medication refills as evidenced by patient report and review of pharmacy fill history as appropriate -Patient/Caregiver will call provider office for new concerns or questions as evidenced by review of documented incoming telephone call notes and patient report -Patient/Caregiver verbalizes understanding of plan -Patient/Caregiver will focus on medication adherence by taking medications as prescribed  -Weigh daily and record (notify MD with 3 lb weight gain over night or 5 lb in a week) -Follow CHF Action Plan -Adhere to low sodium diet  -Continue with light exercise         SDOH assessments and interventions completed:  No     Care Coordination Interventions:  Yes, provided   Interventions Today    Flowsheet Row Most Recent Value  Chronic Disease   Chronic disease during today's visit Congestive  Heart Failure (CHF)  General Interventions   General Interventions Discussed/Reviewed General Interventions Discussed, General Interventions Reviewed  Exercise Interventions   Exercise Discussed/Reviewed Exercise Discussed, Physical Activity  Physical Activity Discussed/Reviewed Physical Activity Discussed, Types of exercise  Education Interventions   Education Provided Provided Education        Follow up plan: Follow up call scheduled for 05/03/22 830 am    Encounter Outcome:  Pt. Visit Completed   Stacey Arms RN, BSN, [facility]   Phone: [phone]

## 2022-04-04 DIAGNOSIS — E1122 Type 2 diabetes mellitus with diabetic chronic kidney disease: Secondary | ICD-10-CM | POA: Diagnosis not present

## 2022-04-04 DIAGNOSIS — N2581 Secondary hyperparathyroidism of renal origin: Secondary | ICD-10-CM | POA: Diagnosis not present

## 2022-04-04 DIAGNOSIS — D631 Anemia in chronic kidney disease: Secondary | ICD-10-CM | POA: Diagnosis not present

## 2022-04-04 DIAGNOSIS — I129 Hypertensive chronic kidney disease with stage 1 through stage 4 chronic kidney disease, or unspecified chronic kidney disease: Secondary | ICD-10-CM | POA: Diagnosis not present

## 2022-04-04 DIAGNOSIS — N184 Chronic kidney disease, stage 4 (severe): Secondary | ICD-10-CM | POA: Diagnosis not present

## 2022-04-04 DIAGNOSIS — I5032 Chronic diastolic (congestive) heart failure: Secondary | ICD-10-CM | POA: Diagnosis not present

## 2022-04-05 ENCOUNTER — Other Ambulatory Visit (HOSPITAL_COMMUNITY): Payer: Self-pay

## 2022-04-05 ENCOUNTER — Other Ambulatory Visit: Payer: Self-pay

## 2022-04-05 ENCOUNTER — Ambulatory Visit (INDEPENDENT_AMBULATORY_CARE_PROVIDER_SITE_OTHER): Payer: PPO | Admitting: Adult Health

## 2022-04-05 ENCOUNTER — Ambulatory Visit: Payer: HMO | Admitting: Psychology

## 2022-04-05 MED ORDER — LOSARTAN POTASSIUM 25 MG PO TABS
25.0000 mg | ORAL_TABLET | Freq: Every day | ORAL | 3 refills | Status: DC
  Filled 2022-04-05: qty 90, 90d supply, fill #0
  Filled 2022-06-26: qty 90, 90d supply, fill #1

## 2022-04-07 ENCOUNTER — Encounter (INDEPENDENT_AMBULATORY_CARE_PROVIDER_SITE_OTHER): Payer: Self-pay | Admitting: Adult Health

## 2022-04-07 ENCOUNTER — Ambulatory Visit (INDEPENDENT_AMBULATORY_CARE_PROVIDER_SITE_OTHER): Payer: HMO | Admitting: Adult Health

## 2022-04-07 DIAGNOSIS — I129 Hypertensive chronic kidney disease with stage 1 through stage 4 chronic kidney disease, or unspecified chronic kidney disease: Secondary | ICD-10-CM | POA: Diagnosis not present

## 2022-04-07 DIAGNOSIS — N184 Chronic kidney disease, stage 4 (severe): Secondary | ICD-10-CM

## 2022-04-07 DIAGNOSIS — Z7984 Long term (current) use of oral hypoglycemic drugs: Secondary | ICD-10-CM

## 2022-04-07 DIAGNOSIS — E669 Obesity, unspecified: Secondary | ICD-10-CM | POA: Diagnosis not present

## 2022-04-07 DIAGNOSIS — E1122 Type 2 diabetes mellitus with diabetic chronic kidney disease: Secondary | ICD-10-CM

## 2022-04-07 DIAGNOSIS — E1159 Type 2 diabetes mellitus with other circulatory complications: Secondary | ICD-10-CM

## 2022-04-07 DIAGNOSIS — Z6841 Body Mass Index (BMI) 40.0 and over, adult: Secondary | ICD-10-CM

## 2022-04-07 NOTE — Progress Notes (Signed)
Chief Complaint:   OBESITY Stacey Page is here to discuss her progress with her obesity treatment plan along with follow-up of her obesity related diagnoses. Stacey Page is on the Category 2 Plan and states she is following her eating plan approximately 50% of the time. Stacey Page states she is walking 10 minutes 3 times per week.  Today's visit was #: 69 Starting weight: 344 lbs Starting date: 03/25/2019 Today's weight: 323 lbs Today's date: 04/07/2022 Total lbs lost to date: 21 lbs Total lbs lost since last in-office visit: - 2 lbs  Interim History:  Stacey Page recently lost a close friend, "Sherene" Sherene had just turned 54 and has very similar co-morbidities as Stacey Page. Her passing 2 weeks ago, has really concerned Stacey Page about her own health and well being.  She was recently seen by her PCP and Nephrologist. Nephrology reduced [place] and started her on an ACE   Stacey Page is please to have lost 2 lbs since last OV HWW.  Subjective:   1. Stage 4 chronic kidney disease ([place]) 04/04/2022 Nephrology/ Dr. Harrie Jeans- Reduced Wilder Glade from '10mg'$  to '5mg'$  and started her on Losartan 88mD BP today 126/70  Latest Reference Range & Units 02/22/22 09:22  Creatinine 0.40 - 1.20 mg/dL 1.94 (H)  (H): Data is abnormally high  2. Hypertension associated with diabetes ([place] 04/04/2022 Nephrology/ Dr. LHarrie Jeans Started her on Losartan 242m BP today 126/70 Continued on : aspirin 81 MG tablet  hydrALAZINE (APRESOLINE) 50 MG tablet  carvedilol (COREG) 25 MG tablet  isosorbide dinitrate (ISORDIL) 10 MG tablet  atorvastatin (LIPITOR) 20 MG tablet  furosemide (LASIX) 80 MG tablet     Assessment/Plan:   1. Stage 4 chronic kidney disease ([place]vensContinue medication regime per PCP/Nephrology/Cardiology  2. Hypertension associated with diabetes (HCIolaContinue- aspirin 81 MG tablet  hydrALAZINE (APRESOLINE) 50 MG tablet  carvedilol (COREG) 25 MG tablet  isosorbide dinitrate  (ISORDIL) 10 MG tablet  atorvastatin (LIPITOR) 20 MG tablet  furosemide (LASIX) 80 MG tablet  losartan (COZAAR) 25 MG tablet  Limit Na+ intake  3. Obesity, current BMI 49.12 Stacey Page is currently in the action stage of change. As such, her goal is to continue with weight loss efforts. She has agreed to the Category 2 Plan.   Exercise goals:  Continue walking and remain as active as possible.  Behavioral modification strategies: increasing lean protein intake, decreasing simple carbohydrates, increasing vegetables, increasing water intake, decreasing sodium intake, no skipping meals, meal planning and cooking strategies, keeping healthy foods in the home, better snacking choices, and planning for success.  GwBraidynas agreed to follow-up with our clinic in 4 weeks. She was informed of the importance of frequent follow-up visits to maximize her success with intensive lifestyle modifications for her multiple health conditions.   Objective:   Blood pressure 126/70, pulse (!) 59, temperature 97.7 F (36.5 C), height '5\' 8"'$  (1.727 m), weight (!) 323 lb (146.5 kg), SpO2 95 %. Body mass index is 49.11 kg/m.  General: Cooperative, alert, well developed, in no acute distress. HEENT: Conjunctivae and lids unremarkable. Cardiovascular: Regular rhythm.  Lungs: Normal work of breathing. Neurologic: No focal deficits.   Lab Results  Component Value Date   CREATININE 1.94 (H) 02/22/2022   BUN 26 (H) 02/22/2022   NA 139 02/22/2022   K 4.0 02/22/2022   CL 98 02/22/2022   CO2 31 02/22/2022   Lab Results  Component Value Date   ALT 9 01/25/2022   AST 17 01/25/2022  ALKPHOS 65 01/25/2022   BILITOT 0.4 01/25/2022   Lab Results  Component Value Date   HGBA1C 6.3 (H) 11/12/2021   HGBA1C 6.0 (A) 07/14/2021   HGBA1C 6.4 (A) 04/14/2021   HGBA1C 6.1 (H) 10/14/2020   HGBA1C 6.1 05/14/2020   Lab Results  Component Value Date   INSULIN 8.6 10/28/2019   INSULIN 9.2 07/04/2019   INSULIN 8.8  03/25/2019   Lab Results  Component Value Date   TSH 0.51 11/28/2018   Lab Results  Component Value Date   CHOL 144 01/14/2022   HDL 57.10 01/14/2022   LDLCALC 73 01/14/2022   TRIG 66.0 01/14/2022   CHOLHDL 3 01/14/2022   Lab Results  Component Value Date   VD25OH 57.6 10/28/2019   VD25OH 41.5 07/04/2019   VD25OH 38.2 03/25/2019   Lab Results  Component Value Date   WBC 9.5 02/01/2022   HGB 12.3 02/01/2022   HCT 38.3 02/01/2022   MCV 90.1 02/01/2022   PLT 287 02/01/2022   Lab Results  Component Value Date   IRON 29 01/25/2022   TIBC 260 01/25/2022   FERRITIN 51 01/25/2022    Attestation Statements:   Reviewed by clinician on day of visit: allergies, medications, problem list, medical history, surgical history, family history, social history, and previous encounter notes.  Time spent on visit including pre-visit chart review and post-visit care and charting was 32 minutes.   I have reviewed the above documentation for accuracy and completeness, and I agree with the above. -  Marieta Markov d. Taila Basinski, NP-C

## 2022-04-13 ENCOUNTER — Telehealth: Payer: Self-pay | Admitting: Family Medicine

## 2022-04-13 NOTE — Telephone Encounter (Signed)
Copy of order up front ready for pick up

## 2022-04-13 NOTE — Telephone Encounter (Signed)
Caller Name: Rinda Call back phone #: [phone]  Reason for Call: Pt stated that she will be by in the morning 3/7 to pick up her script for diabetic shoes.

## 2022-04-14 ENCOUNTER — Ambulatory Visit (INDEPENDENT_AMBULATORY_CARE_PROVIDER_SITE_OTHER): Payer: HMO | Admitting: Psychology

## 2022-04-14 DIAGNOSIS — F330 Major depressive disorder, recurrent, mild: Secondary | ICD-10-CM

## 2022-04-14 DIAGNOSIS — F411 Generalized anxiety disorder: Secondary | ICD-10-CM

## 2022-04-14 NOTE — Progress Notes (Signed)
[facility] Counselor Initial Adult Exam  Name: Stacey Page Date: 04/14/2022 MRN: [id number] DOB: 1950-11-04 PCP: [name], MD  Time spent: 60 minutes  Guardian/Payee:  self  Paperwork requested: No   Reason for Visit /Presenting Problem: The patient attended the initial diagnostic evaluation in the office today. The patient reports that she has been a little more depressed lately and requested someone to talk to.  Mental Status Exam: Appearance:   Casual     Behavior:  Appropriate  Motor:  Normal  Speech/Language:   Pressured  Affect:  Blunt  Mood:  normal  Thought process:  flight of ideas  Thought content:    WNL  Sensory/Perceptual disturbances:    WNL  Orientation:  oriented to person, place, time/date, and situation  Attention:  Fair  Concentration:  Fair  Memory:  [place] of knowledge:   Good  Insight:    Fair  Judgment:   Good  Impulse Control:  Good    Reported Symptoms: Feelings of sadness and some anxiety.  Risk Assessment: Danger to Self:  No Self-injurious Behavior: No Danger to Others: No Duty to Warn:no Physical Aggression / Violence:No  Access to Firearms a concern: No  Gang Involvement:No  Patient / guardian was educated about steps to take if suicide or homicide risk level increases between visits: n/a While future psychiatric events cannot be accurately predicted, the patient does not currently require acute inpatient psychiatric care and does not currently meet [facility] involuntary commitment criteria.  Substance Abuse History: Current substance abuse: Unknown  Past Psychiatric History:   Previous psychological history is significant for depression Outpatient Providers:Someone in [place] History of Psych Hospitalization: Unknown Psychological Testing: n/a  Abuse History:  Victim of: No., n/a Report needed: No. Victim of Neglect:No. Perpetrator of no Witness / Exposure to Domestic Violence: No    Protective Services Involvement: No  Witness to [employer] Violence:  No   Family History:  Family History  Problem Relation Age of Onset   COPD Father    Hypothyroidism Father    Anemia Father        iron deficiency   High blood pressure Father    Alcoholism Father    Cancer Mother 3       pancreatic   High blood pressure Mother    High Cholesterol Mother    Breast cancer Neg Hx     Living situation: the patient lives with their family  Sexual Orientation: Straight  Relationship Status: widowed  Name of spouse / other:Robert If a parent, number of children / ages:Two grown Daughters - 80 and 66  Support Systems: friends  Museum/gallery curator Stress:  Yes   Income/Employment/Disability: Actor: No   Educational History: Education: Unknown  Religion/Sprituality/World View: Unknown  Any cultural differences that may affect / interfere with treatment:  not applicable   Recreation/Hobbies: unknown  Stressors: Financial difficulties   Health problems   Marital or family conflict    Strengths: Supportive Relationships, Family, Friends, and Conservator, museum/gallery  Barriers:  health issues  Legal History: Pending legal issue / charges: The patient has no significant history of legal issues. History of legal issue / charges: n/a  Medical History/Surgical History: reviewed Past Medical History:  Diagnosis Date   Anxiety    doesn't take any meds   Arthritis of right knee 03/14/2016   Asthma    Back pain    Chronic diastolic CHF (congestive heart failure) ([place])  HF with Preserved EF (60-65%) - Grade II Diastolic Dysfunction (Hypertensive Heart Disease). takes Furosemide daily   Depression    doesn't take meds   Diabetes ([place])    takes Januvia daily   Eczema    uses cream as needed   GERD (gastroesophageal reflux disease)    History of bronchitis as a child    HTN (hypertension)    Insomnia    Moderate aortic stenosis by prior  echocardiogram 04/2017   10/15/2021: Horald Pollen calcification with moderate AS, MG 23 mmHg   Mycobacterium avium-intracellulare infection ([place]) 2016   OA (osteoarthritis)    Obese    OSA (obstructive sleep apnea) 02/25/2014   wears CPAP at night   Peripheral neuropathy    takes Gabapentin as needed   Pneumonia    hx of-2010   Seasonal allergies    uses Flonase daily   Sleep apnea     Past Surgical History:  Procedure Laterality Date   BREAST BIOPSY Right 03/2018   BREAST LUMPECTOMY WITH RADIOACTIVE SEED LOCALIZATION Right 12/28/2018   Procedure: RIGHT BREAST LUMPECTOMY WITH RADIOACTIVE SEED LOCALIZATION;  Surgeon: Jovita Kussmaul, MD;  Location: [place];  Service: General;  Laterality: Right;   BREAST SURGERY     CARDIOPULMONARY EXERCISE TEST (CPX)  06/21/2021   (Performed on Coreg 25 mg twice daily): PFTs: FVC 1.76 (60%), FEV1 1.59 (77%), ratio 114% => RESTRICTIVE PHYSIOLOGY; 5 min- 1 mph - 2% grade. 7/10 dyspnea -> pulse ox range during exercise 95 to 98%, low of 90%, 97% on recovery; BP 106/84-160/64; heart rate 64-90 bpm (60% MPHR) -> Chronotropic Incompetence;;MAIN FACTOR for Exercise Limitation = BODY HABITUS w/ Chronotropic Incompetence.   CESAREAN SECTION  x2   COLONOSCOPY     CORONARY CALCIUM SCORE & CT ANGIOGRAM  09/2017   Coronary Ca Score = 11 (low).  CTA- no obstructive CAD (minimal disease)   JOINT REPLACEMENT     KNEE ARTHROSCOPY Right    LUNG BIOPSY Right 06/10/2014   Procedure: LUNG BIOPSY;  Surgeon: Ivin Poot, MD;  Location: [place];  Service: Thoracic;  Laterality: Right;   POLYPECTOMY     throat   RIGHT HEART CATH N/A 10/15/2021   Procedure: RIGHT HEART CATH;  Surgeon: Belva Crome, MD;  Location: [facility];  Service: CV: Mild Pulmonary Hypertension (WHO Group 3).  Mean PAP 28 mmHg, PCWP 13 mmHg.  PVR 2.73 [place].  RAP 6 mmHg.CO-CI 5.5L/min, 2.17 L/min/m.   TOTAL KNEE ARTHROPLASTY Right 03/14/2016   Procedure: RIGHT TOTAL KNEE ARTHROPLASTY;  Surgeon:  Marybelle Killings, MD;  Location: [place];  Service: Orthopedics;  Laterality: Right;   TRANSTHORACIC ECHOCARDIOGRAM  10/10/2021   EF 70 to 75%.  Hyperdynamic with no RWMA.  Moderate LVH.  Moderate LA dilation indicating likely diastolic dysfunction although not interpretable.  Moderately elevated PAP with normal RV function, dilated IVC with elevated RAP estimated at 15 mmHg..  Moderate MAC with no MR.  AOV calcification with Moderate AS-mean AvG 23 mmHg.   TRANSTHORACIC ECHOCARDIOGRAM  06/14/2021   EF 60 to 65%.  No RWMA.  Moderate cLVH with GR 1 DD.  Mild LA dilation.  Mild to moderate aortic calcification/stenosis.  Mean AVG 18 mmHg.   VIDEO ASSISTED THORACOSCOPY Right 06/10/2014   Procedure: VIDEO ASSISTED THORACOSCOPY;  Surgeon: Ivin Poot, MD;  [facility];  Service: Thoracic;  Laterality: Right;    Medications: Current Outpatient Medications  Medication Sig Dispense Refill   albuterol (VENTOLIN HFA) 108 (  90 Base) MCG/ACT inhaler INHALE 1-2 PUFFS into THE lungs EVERY 6 HOURS AS NEEDED FOR WHEEZING AND/OR SHORTNESS OF BREATH 8.5 g 3   aspirin 81 MG tablet Take 1 tablet (81 mg total) by mouth daily with breakfast. 90 tablet 0   atorvastatin (LIPITOR) 20 MG tablet Take 1 tablet (20 mg total) by mouth daily. 90 tablet 3   Blood Glucose Monitoring Suppl ([place]) w/Device KIT 1 kit by Does not apply route daily as needed. 1 kit 0   carvedilol (COREG) 25 MG tablet TAKE ONE TABLET BY MOUTH TWICE DAILY 180 tablet 3   cholecalciferol (VITAMIN D3) 25 MCG (1000 UNIT) tablet Take 2,000 Units by mouth daily.     Continuous Blood Gluc Sensor (FREESTYLE LIBRE 2 SENSOR) MISC Place sensor on the arm every 14 days and use to monitor blood sugars continuously as directed. 6 each 3   dapagliflozin propanediol (FARXIGA) 10 MG TABS tablet Take 10 mg by mouth daily.     Docusate Sodium 100 MG capsule Take 100 mg by mouth 2 (two) times daily.     ferrous sulfate 325 (65 FE) MG tablet Take  325 mg by mouth 2 (two) times daily with a meal.     fluticasone (FLONASE) 50 MCG/ACT nasal spray instill ONE SPRAY in each nostril daily (Patient taking differently: Place 1 spray into both nostrils daily.) 16 g 3   furosemide (LASIX) 80 MG tablet Take 1 tablet (80 mg total) by mouth daily. 30 tablet 6   gabapentin (NEURONTIN) 100 MG capsule TAKE ONE CAPSULE BY MOUTH TWICE DAILY 180 capsule 1   gabapentin (NEURONTIN) 100 MG capsule Take 1 capsule (100 mg total) by mouth 2 (two) times daily. 180 capsule 1   glucose blood (ONETOUCH VERIO) test strip Use to monitor blood sugar twice daily as instructed 200 each 3   hydrALAZINE (APRESOLINE) 50 MG tablet Take 1 tablet (50 mg total) by mouth every 8 (eight) hours. 90 tablet 3   isosorbide dinitrate (ISORDIL) 10 MG tablet Take 1 tablet (10 mg total) by mouth 3 (three) times daily. Please keep scheduled appointment 90 tablet 1   losartan (COZAAR) 25 MG tablet Take 1 tablet (25 mg total) by mouth daily. 90 tablet 3   montelukast (SINGULAIR) 10 MG tablet TAKE ONE TABLET BY MOUTH EVERYDAY AT BEDTIME 90 tablet 3   OneTouch Delica Lancets 99991111 MISC Use to monitor blood sugars once daily as directed 100 each 11   pantoprazole (PROTONIX) 40 MG tablet Take 1 tablet (40 mg total) by mouth daily. 90 tablet 1   potassium chloride (KLOR-CON M) 10 MEQ tablet Take 1 tablet (10 mEq total) by mouth daily. 60 tablet 2   No current facility-administered medications for this visit.    No Known Allergies  Diagnoses:  Major depressive disorder, recurrent episode, mild (HCC)  Generalized anxiety disorder  Plan of Care: Plan of care will be developed at next session.   Kinslie Hove G Sophiea Ueda, LCSW                   Torrence Hammack G Dalia Jollie, LCSW

## 2022-04-16 ENCOUNTER — Other Ambulatory Visit: Payer: Self-pay | Admitting: Cardiology

## 2022-04-16 ENCOUNTER — Other Ambulatory Visit (HOSPITAL_COMMUNITY): Payer: Self-pay

## 2022-04-17 ENCOUNTER — Other Ambulatory Visit: Payer: Self-pay | Admitting: Nurse Practitioner

## 2022-04-17 DIAGNOSIS — G63 Polyneuropathy in diseases classified elsewhere: Secondary | ICD-10-CM

## 2022-04-18 ENCOUNTER — Other Ambulatory Visit: Payer: Self-pay

## 2022-04-18 MED ORDER — ISOSORBIDE DINITRATE 10 MG PO TABS
10.0000 mg | ORAL_TABLET | Freq: Three times a day (TID) | ORAL | 7 refills | Status: DC
  Filled 2022-04-18 – 2022-04-19 (×2): qty 90, 30d supply, fill #0
  Filled 2022-05-09: qty 90, 30d supply, fill #1
  Filled 2022-05-15: qty 270, 90d supply, fill #1
  Filled 2022-08-21 – 2022-08-23 (×3): qty 270, 90d supply, fill #2
  Filled 2022-11-13: qty 270, 90d supply, fill #3
  Filled 2022-11-19: qty 90, 30d supply, fill #3

## 2022-04-19 ENCOUNTER — Other Ambulatory Visit: Payer: Self-pay

## 2022-04-19 ENCOUNTER — Other Ambulatory Visit (HOSPITAL_COMMUNITY): Payer: Self-pay

## 2022-04-19 DIAGNOSIS — G4733 Obstructive sleep apnea (adult) (pediatric): Secondary | ICD-10-CM | POA: Diagnosis not present

## 2022-04-21 ENCOUNTER — Encounter: Payer: Self-pay | Admitting: Family Medicine

## 2022-04-21 ENCOUNTER — Other Ambulatory Visit (HOSPITAL_COMMUNITY): Payer: Self-pay

## 2022-04-21 ENCOUNTER — Ambulatory Visit: Payer: HMO | Admitting: Family Medicine

## 2022-04-21 ENCOUNTER — Ambulatory Visit (INDEPENDENT_AMBULATORY_CARE_PROVIDER_SITE_OTHER): Payer: HMO | Admitting: Psychology

## 2022-04-21 ENCOUNTER — Ambulatory Visit (INDEPENDENT_AMBULATORY_CARE_PROVIDER_SITE_OTHER): Payer: HMO | Admitting: Family Medicine

## 2022-04-21 VITALS — BP 110/66 | HR 58 | Temp 97.6°F | Ht 68.0 in | Wt 332.2 lb

## 2022-04-21 DIAGNOSIS — N184 Chronic kidney disease, stage 4 (severe): Secondary | ICD-10-CM | POA: Diagnosis not present

## 2022-04-21 DIAGNOSIS — F330 Major depressive disorder, recurrent, mild: Secondary | ICD-10-CM | POA: Diagnosis not present

## 2022-04-21 DIAGNOSIS — E1122 Type 2 diabetes mellitus with diabetic chronic kidney disease: Secondary | ICD-10-CM

## 2022-04-21 DIAGNOSIS — J309 Allergic rhinitis, unspecified: Secondary | ICD-10-CM | POA: Diagnosis not present

## 2022-04-21 DIAGNOSIS — F411 Generalized anxiety disorder: Secondary | ICD-10-CM | POA: Diagnosis not present

## 2022-04-21 MED ORDER — FLUTICASONE PROPIONATE 50 MCG/ACT NA SUSP
1.0000 | Freq: Every day | NASAL | 1 refills | Status: DC
  Filled 2022-04-21: qty 16, 60d supply, fill #0
  Filled 2022-04-24 – 2022-06-16 (×2): qty 16, 60d supply, fill #1
  Filled 2022-07-24: qty 16, 60d supply, fill #2
  Filled 2022-12-17: qty 16, 60d supply, fill #3

## 2022-04-21 MED ORDER — DAPAGLIFLOZIN PROPANEDIOL 5 MG PO TABS
5.0000 mg | ORAL_TABLET | Freq: Every day | ORAL | 2 refills | Status: DC
  Filled 2022-04-21: qty 90, 90d supply, fill #0
  Filled 2022-04-27: qty 30, 30d supply, fill #0

## 2022-04-21 MED ORDER — MONTELUKAST SODIUM 10 MG PO TABS
10.0000 mg | ORAL_TABLET | Freq: Every day | ORAL | 3 refills | Status: DC
  Filled 2022-04-21: qty 90, 90d supply, fill #0
  Filled 2022-08-14: qty 90, 90d supply, fill #1
  Filled 2022-12-11: qty 90, 90d supply, fill #2
  Filled 2023-03-18: qty 90, 90d supply, fill #3

## 2022-04-21 NOTE — Progress Notes (Signed)
[place] [facility] Counselor/Therapist Progress Note  Patient ID: Stacey Page, MRN: [id number],    Date: 04/21/2022  Time Spent: 60 minutes  Treatment Type: Individual Therapy  Reported Symptoms: decreased motivation, feeling a little anxious, sadness  Mental Status Exam: Appearance:  Casual     Behavior: Appropriate  Motor: Normal  Speech/Language:  Pressured  Affect: Blunt  Mood: depressed  Thought process: normal  Thought content:   WNL  Sensory/Perceptual disturbances:   WNL  Orientation: oriented to person, place, time/date, and situation  Attention: Good  Concentration: Good  Memory: WNL  Fund of knowledge:  Good  Insight:   Good  Judgment:  Good  Impulse Control: Good   Risk Assessment: Danger to Self:  No Self-injurious Behavior: No Danger to Others: No Duty to Warn:no Physical Aggression / Violence:No  Access to Firearms a concern: No  Gang Involvement:No   Subjective: The patient attended an individual therapy session in the office today.  The patient presents with a blunted affect and mood is depressed.  The patient reports that she has been somewhat frustrated by some of the behaviors of her children.  We talked about her setting limits with them and it seems that she may be worried about how they are doing and not focusing on taking care of herself.  I encouraged her to try to focus on herself first and continue to set limits with her family members.  It seems that they may be warning her to do too much for them as though they are essentially grown.  Interventions: Cognitive Behavioral Therapy, Assertiveness/Communication, and Insight-Oriented  Diagnosis:Major depressive disorder, recurrent episode, mild (HCC)  Generalized anxiety disorder  Plan: " I need someone to talk to" Supportive family, intelligent Objective Learn and implement problem-solving strategies for realistically addressing worries. Target Date: 04/21/2023 Frequency:  Biweekly Progress: 0 Modality: individual Related Interventions 1. Teach the client problem-solving strategies involving specifically defining a problem,  generating options for addressing it, evaluating the pros and cons of each option, selecting and  implementing an optional action, and reevaluating and refining the action (or assign "Applying  Problem-Solving to Interpersonal Conflict" in the Adult Psychotherapy Homework Planner by  Bryn Gulling). Objective Describe situations, thoughts, feelings, and actions associated with anxieties and worries, their impact  on functioning, and attempts to resolve them. Target Date: 04/21/2023 Frequency: Biweekly Progress: 0 Modality: individual Diagnosis  300.02 (Generalized anxiety disorder)  F33.0 (Major depressive affective disorder, recurrent episode, mild Conditions For Discharge Achievement of treatment goals and objectives   Gloris Shiroma G Ramonte Mena, LCSW

## 2022-04-21 NOTE — Progress Notes (Signed)
Established Patient Office Visit   Subjective:  Patient ID: Stacey Page, female    DOB: 1950/10/21  Age: 72 y.o. MRN: [id number]  Chief Complaint  Patient presents with   Medical Management of Chronic Issues    1 month follow up on Farxiga, no concerns.     HPI Encounter Diagnoses  Name Primary?   Type 2 diabetes mellitus with stage 4 chronic kidney disease, without long-term current use of insulin (HCC) Yes   Stage 4 chronic kidney disease (HCC)    Allergic rhinitis, unspecified seasonality, unspecified trigger    For follow-up of above.  Just saw nephrology.  They are recommending decreasing the Farxiga to 5 mg daily.  They have added losartan 25 mg.  Renal function has been stable.  Patient requesting refills on Flonase and Singulair.  She is taking these medications for years for chronic allergy rhinitis.  No issues with the Singulair.  Continues follow-up with behavioral health and PMD for talking therapy.   Review of Systems  Constitutional: Negative.   HENT: Negative.    Eyes:  Negative for blurred vision, discharge and redness.  Respiratory: Negative.    Cardiovascular: Negative.   Gastrointestinal:  Negative for abdominal pain.  Genitourinary: Negative.   Musculoskeletal: Negative.  Negative for myalgias.  Skin:  Negative for rash.  Neurological:  Negative for tingling, loss of consciousness and weakness.  Endo/Heme/Allergies:  Negative for polydipsia.     Current Outpatient Medications:    albuterol (VENTOLIN HFA) 108 (90 Base) MCG/ACT inhaler, INHALE 1-2 PUFFS into THE lungs EVERY 6 HOURS AS NEEDED FOR WHEEZING AND/OR SHORTNESS OF BREATH, Disp: 8.5 g, Rfl: 3   aspirin 81 MG tablet, Take 1 tablet (81 mg total) by mouth daily with breakfast., Disp: 90 tablet, Rfl: 0   atorvastatin (LIPITOR) 20 MG tablet, Take 1 tablet (20 mg total) by mouth daily., Disp: 90 tablet, Rfl: 3   Blood Glucose Monitoring Suppl ([place]) w/Device KIT, 1 kit by Does  not apply route daily as needed., Disp: 1 kit, Rfl: 0   carvedilol (COREG) 25 MG tablet, TAKE ONE TABLET BY MOUTH TWICE DAILY, Disp: 180 tablet, Rfl: 3   cholecalciferol (VITAMIN D3) 25 MCG (1000 UNIT) tablet, Take 2,000 Units by mouth daily., Disp: , Rfl:    Continuous Blood Gluc Sensor (FREESTYLE LIBRE 2 SENSOR) MISC, Place sensor on the arm every 14 days and use to monitor blood sugars continuously as directed., Disp: 6 each, Rfl: 3   dapagliflozin propanediol (FARXIGA) 5 MG TABS tablet, Take 1 tablet (5 mg total) by mouth daily before breakfast., Disp: 90 tablet, Rfl: 2   Docusate Sodium 100 MG capsule, Take 100 mg by mouth 2 (two) times daily., Disp: , Rfl:    ferrous sulfate 325 (65 FE) MG tablet, Take 325 mg by mouth 2 (two) times daily with a meal., Disp: , Rfl:    furosemide (LASIX) 80 MG tablet, Take 1 tablet (80 mg total) by mouth daily., Disp: 30 tablet, Rfl: 6   gabapentin (NEURONTIN) 100 MG capsule, Take 1 capsule (100 mg total) by mouth 2 (two) times daily., Disp: 180 capsule, Rfl: 1   gabapentin (NEURONTIN) 100 MG capsule, TAKE ONE CAPSULE BY MOUTH TWICE DAILY, Disp: 180 capsule, Rfl: 1   glucose blood (ONETOUCH VERIO) test strip, Use to monitor blood sugar twice daily as instructed, Disp: 200 each, Rfl: 3   hydrALAZINE (APRESOLINE) 50 MG tablet, Take 1 tablet (50 mg total) by mouth every 8 (eight)  hours., Disp: 90 tablet, Rfl: 3   isosorbide dinitrate (ISORDIL) 10 MG tablet, Take 1 tablet (10 mg total) by mouth 3 (three) times daily. Please keep scheduled appointment, Disp: 90 tablet, Rfl: 7   losartan (COZAAR) 25 MG tablet, Take 1 tablet (25 mg total) by mouth daily., Disp: 90 tablet, Rfl: 3   OneTouch Delica Lancets 99991111 MISC, Use to monitor blood sugars once daily as directed, Disp: 100 each, Rfl: 11   pantoprazole (PROTONIX) 40 MG tablet, Take 1 tablet (40 mg total) by mouth daily., Disp: 90 tablet, Rfl: 1   potassium chloride (KLOR-CON M) 10 MEQ tablet, Take 1 tablet (10 mEq  total) by mouth daily., Disp: 60 tablet, Rfl: 2   fluticasone (FLONASE) 50 MCG/ACT nasal spray, Place 1 spray into both nostrils daily. instill ONE SPRAY in each nostril daily Strength: 50 MCG/ACT, Disp: 48 mL, Rfl: 1   montelukast (SINGULAIR) 10 MG tablet, TAKE ONE TABLET BY MOUTH EVERYDAY AT BEDTIME, Disp: 90 tablet, Rfl: 3   Objective:     BP 110/66 (BP Location: Left Arm, Patient Position: Sitting, Cuff Size: Large)   Pulse (!) 58   Temp 97.6 F (36.4 C) (Temporal)   Ht '5\' 8"'$  (1.727 m)   Wt (!) 332 lb 3.2 oz (150.7 kg)   SpO2 96%   BMI 50.51 kg/m    Physical Exam Constitutional:      General: She is not in acute distress.    Appearance: Normal appearance. She is not ill-appearing, toxic-appearing or diaphoretic.  HENT:     Head: Normocephalic and atraumatic.     Right Ear: External ear normal.     Left Ear: External ear normal.  Eyes:     General: No scleral icterus.       Right eye: No discharge.        Left eye: No discharge.     Extraocular Movements: Extraocular movements intact.     Conjunctiva/sclera: Conjunctivae normal.  Cardiovascular:     Rate and Rhythm: Normal rate and regular rhythm.     Heart sounds: Murmur heard.  Pulmonary:     Effort: Pulmonary effort is normal. No respiratory distress.     Breath sounds: No wheezing, rhonchi or rales.  Musculoskeletal:     Right lower leg: No edema.     Left lower leg: No edema.  Skin:    General: Skin is warm and dry.  Neurological:     Mental Status: She is alert and oriented to person, place, and time.  Psychiatric:        Mood and Affect: Mood normal.        Behavior: Behavior normal.      No results found for any visits on 04/21/22.    The 10-year ASCVD risk score (Arnett DK, et al., 2019) is: 15.4%    Assessment & Plan:   Type 2 diabetes mellitus with stage 4 chronic kidney disease, without long-term current use of insulin (HCC) -     Dapagliflozin Propanediol; Take 1 tablet (5 mg total) by  mouth daily before breakfast.  Dispense: 90 tablet; Refill: 2 -     Hemoglobin A1c  Stage 4 chronic kidney disease (HCC) -     Dapagliflozin Propanediol; Take 1 tablet (5 mg total) by mouth daily before breakfast.  Dispense: 90 tablet; Refill: 2  Allergic rhinitis, unspecified seasonality, unspecified trigger -     Montelukast Sodium; TAKE ONE TABLET BY MOUTH EVERYDAY AT BEDTIME  Dispense: 90 tablet; Refill: 3 -  Fluticasone Propionate; Place 1 spray into both nostrils daily. instill ONE SPRAY in each nostril daily Strength: 50 MCG/ACT  Dispense: 48 mL; Refill: 1    Return in about 3 months (around 07/22/2022).    [name], MD

## 2022-04-22 ENCOUNTER — Other Ambulatory Visit: Payer: Self-pay

## 2022-04-22 ENCOUNTER — Encounter: Payer: Self-pay | Admitting: Pharmacist

## 2022-04-22 DIAGNOSIS — M2041 Other hammer toe(s) (acquired), right foot: Secondary | ICD-10-CM | POA: Diagnosis not present

## 2022-04-22 DIAGNOSIS — I739 Peripheral vascular disease, unspecified: Secondary | ICD-10-CM | POA: Diagnosis not present

## 2022-04-22 DIAGNOSIS — E1142 Type 2 diabetes mellitus with diabetic polyneuropathy: Secondary | ICD-10-CM | POA: Diagnosis not present

## 2022-04-22 DIAGNOSIS — M792 Neuralgia and neuritis, unspecified: Secondary | ICD-10-CM | POA: Diagnosis not present

## 2022-04-22 LAB — HEMOGLOBIN A1C: Hgb A1c MFr Bld: 6.5 % (ref 4.6–6.5)

## 2022-04-25 ENCOUNTER — Other Ambulatory Visit: Payer: Self-pay | Admitting: Family Medicine

## 2022-04-25 ENCOUNTER — Other Ambulatory Visit (HOSPITAL_COMMUNITY): Payer: Self-pay

## 2022-04-25 ENCOUNTER — Other Ambulatory Visit: Payer: Self-pay

## 2022-04-25 MED ORDER — CARVEDILOL 25 MG PO TABS
25.0000 mg | ORAL_TABLET | Freq: Two times a day (BID) | ORAL | 3 refills | Status: DC
  Filled 2022-04-25: qty 180, 90d supply, fill #0
  Filled 2022-07-24: qty 180, 90d supply, fill #1
  Filled 2022-10-16: qty 180, 90d supply, fill #2
  Filled 2023-01-14: qty 180, 90d supply, fill #3

## 2022-04-25 NOTE — Telephone Encounter (Signed)
Prescription Request  04/25/2022  LOV: 04/21/2022  What is the name of the medication or equipment? carvedilol (COREG) 25 MG tablet [id number] , one touch test strips  Have you contacted your pharmacy to request a refill? No   Which pharmacy would you like this sent to?  [facility] [place] [address] Phone: [phone] Fax: [phone]     Patient notified that their request is being sent to the clinical staff for review and that they should receive a response within 2 business days.   Please advise at Mobile [phone] (mobile)

## 2022-04-25 NOTE — Telephone Encounter (Signed)
Refill request for pending Rx last OV with cardiologist 03/22/22 last with PCP 04/21/22. Okay to refill?

## 2022-04-27 ENCOUNTER — Telehealth: Payer: Self-pay | Admitting: Family Medicine

## 2022-04-27 NOTE — Telephone Encounter (Signed)
Please advise message below  °

## 2022-04-27 NOTE — Telephone Encounter (Signed)
Patient dropped off document  med records , to be filled out by provider. Patient requested to send it via Call Patient to pick up within 5-days. Document is located in providers tray at front office.Please advise at Mobile [phone] (mobile)

## 2022-04-28 ENCOUNTER — Other Ambulatory Visit: Payer: Self-pay

## 2022-04-28 ENCOUNTER — Other Ambulatory Visit (HOSPITAL_COMMUNITY): Payer: Self-pay

## 2022-04-28 NOTE — Telephone Encounter (Signed)
Forms received placed on Providers desk to be filled out then faxed

## 2022-04-29 ENCOUNTER — Other Ambulatory Visit: Payer: Self-pay

## 2022-04-29 ENCOUNTER — Ambulatory Visit: Payer: HMO | Admitting: Family Medicine

## 2022-04-29 ENCOUNTER — Telehealth: Payer: Self-pay | Admitting: Family Medicine

## 2022-04-29 NOTE — Telephone Encounter (Signed)
Pt is requesting that her paperwork she dropped off, after filled out, please fax to the number on the form and leave the original up front to be picked up. Please call when form is ready.

## 2022-05-02 NOTE — Telephone Encounter (Signed)
Form completed and faxed patient aware and will pick up.

## 2022-05-03 ENCOUNTER — Ambulatory Visit: Payer: Self-pay

## 2022-05-03 NOTE — Patient Outreach (Signed)
  Care Coordination   Follow Up Visit Note   05/03/2022 Name: Evalynne Comte MRN: [id number] DOB: 1950-11-16  Marlana Ratledge is a 72 y.o. year old female who sees [name], MD for primary care. I spoke with  [name] by phone today.  What matters to the patients health and wellness today?  Mrs. Chowdhury reported feeling great today and denied experiencing any swelling, chest pain, or shortness of breath. She is currently engaging in some chair exercises and walking within her home. We went over the Heart Failure action plan, and I encouraged her to keep up the fantastic job she is doing.    Goals Addressed             This Visit's Progress    I want to monitor and manage my CHF       Patient Goals/Self Care Activities: -Patient/Caregiver will self-administer medications as prescribed as evidenced by self-report/primary caregiver report  -Patient/Caregiver will attend all scheduled provider appointments as evidenced by clinician review of documented attendance to scheduled appointments and patient/caregiver report -Patient/Caregiver will call pharmacy for medication refills as evidenced by patient report and review of pharmacy fill history as appropriate -Patient/Caregiver will call provider office for new concerns or questions as evidenced by review of documented incoming telephone call notes and patient report -Patient/Caregiver verbalizes understanding of plan -Patient/Caregiver will focus on medication adherence by taking medications as prescribed  -Weigh daily and record (notify MD with 3 lb weight gain over night or 5 lb in a week) -Follow CHF Action Plan- do a check daily --Continue with light exercise, doing chair exercises and walking in the house         SDOH assessments and interventions completed:  No     Care Coordination Interventions:  Yes, provided   Interventions Today    Flowsheet Row Most Recent Value  Chronic Disease   Chronic  disease during today's visit Congestive Heart Failure (CHF)  General Interventions   General Interventions Discussed/Reviewed General Interventions Reviewed  Exercise Interventions   Exercise Discussed/Reviewed Weight Managment, Physical Activity  Physical Activity Discussed/Reviewed Physical Activity Discussed  Weight Management Weight loss  Nutrition Interventions   Nutrition Discussed/Reviewed Nutrition Discussed        Follow up plan: Follow up call scheduled for 05/27/22 10 am    Encounter Outcome:  Pt. Visit Completed   Lazaro Arms RN, BSN, [facility]   Phone: [phone]

## 2022-05-03 NOTE — Patient Instructions (Signed)
Visit Information  Thank you for taking time to visit with me today. Please don't hesitate to contact me if I can be of assistance to you.   Following are the goals we discussed today:   Goals Addressed             This Visit's Progress    I want to monitor and manage my CHF       Patient Goals/Self Care Activities: -Patient/Caregiver will self-administer medications as prescribed as evidenced by self-report/primary caregiver report  -Patient/Caregiver will attend all scheduled provider appointments as evidenced by clinician review of documented attendance to scheduled appointments and patient/caregiver report -Patient/Caregiver will call pharmacy for medication refills as evidenced by patient report and review of pharmacy fill history as appropriate -Patient/Caregiver will call provider office for new concerns or questions as evidenced by review of documented incoming telephone call notes and patient report -Patient/Caregiver verbalizes understanding of plan -Patient/Caregiver will focus on medication adherence by taking medications as prescribed  -Weigh daily and record (notify MD with 3 lb weight gain over night or 5 lb in a week) -Follow CHF Action Plan- do a check daily --Continue with light exercise, doing chair exercises and walking in the house         Our next appointment is by telephone on 419/24 at 10 am  Please call the care guide team at [phone] if you need to cancel or reschedule your appointment.   If you are experiencing a Mental Health or [place] or need someone to talk to, please call 1-800-273-TALK (toll free, 24 hour hotline)  Patient verbalizes understanding of instructions and care plan provided today and agrees to view in [place]. Active MyChart status and patient understanding of how to access instructions and care plan via MyChart confirmed with patient.     Lazaro Arms RN, BSN, [facility]    Phone: [phone]

## 2022-05-05 ENCOUNTER — Encounter (INDEPENDENT_AMBULATORY_CARE_PROVIDER_SITE_OTHER): Payer: Self-pay | Admitting: Adult Health

## 2022-05-05 ENCOUNTER — Ambulatory Visit (INDEPENDENT_AMBULATORY_CARE_PROVIDER_SITE_OTHER): Payer: HMO | Admitting: Adult Health

## 2022-05-05 VITALS — BP 116/58 | HR 52 | Temp 98.0°F | Ht 68.0 in | Wt 327.0 lb

## 2022-05-05 DIAGNOSIS — N184 Chronic kidney disease, stage 4 (severe): Secondary | ICD-10-CM

## 2022-05-05 DIAGNOSIS — Z6841 Body Mass Index (BMI) 40.0 and over, adult: Secondary | ICD-10-CM | POA: Diagnosis not present

## 2022-05-05 DIAGNOSIS — Z7984 Long term (current) use of oral hypoglycemic drugs: Secondary | ICD-10-CM

## 2022-05-05 DIAGNOSIS — I152 Hypertension secondary to endocrine disorders: Secondary | ICD-10-CM

## 2022-05-05 DIAGNOSIS — E1159 Type 2 diabetes mellitus with other circulatory complications: Secondary | ICD-10-CM | POA: Diagnosis not present

## 2022-05-05 DIAGNOSIS — E1122 Type 2 diabetes mellitus with diabetic chronic kidney disease: Secondary | ICD-10-CM | POA: Diagnosis not present

## 2022-05-05 DIAGNOSIS — E669 Obesity, unspecified: Secondary | ICD-10-CM | POA: Diagnosis not present

## 2022-05-05 NOTE — Progress Notes (Signed)
WEIGHT SUMMARY AND BIOMETRICS  Vitals Temp: 15 F (36.7 C) BP: (!) 116/58 Pulse Rate: (!) 52 SpO2: 100 %   Anthropometric Measurements Height: 5\' 8"  (1.727 m) Weight: (!) 327 lb (148.3 kg) BMI (Calculated): 49.73 Weight at Last Visit: 344 lb Weight Lost Since Last Visit: 17 lb Weight Gained Since Last Visit: 0   No data recorded Other Clinical Data Fasting: No Today's Visit #: 36    Chief Complaint:   OBESITY Stacey Page is here to discuss her progress with her obesity treatment plan. She is on the the Category 2 Plan and states she is following her eating plan approximately 50 % of the time.  She states she is exercising Chair Exercise 10 minutes 3 times per week.   Interim History:  Last OV at [facility] on 04/07/2022: 04/14/2022- Counseling  04/21/2022- Counseling 04/21/2022 OV PCP/Dr. Ethelene Hal 04/22/2022 OV Podiatry   She has been driving her daughter to/from her two jobs, which has increase her daily steps.  She is not wearing portable oxygen at OV Vitals reveal oxygen Sat 100% on RA  Subjective:   1. Stage 4 chronic kidney disease ([place])  Latest Reference Range & Units 02/08/22 14:37 02/22/22 09:22  GFR >60.00 mL/min 24.17 (L) 25.82 (L)  (L): Data is abnormally low 04/04/2022 Nephrology/ Dr. Harrie Jeans- Reduced Wilder [place]ade from 10mg  to 5mg  and started her on Losartan 65mQD BP excellent at OV  2. Hypertension associated with diabetes ([place]) BP today- 115/51, HR 52 She is currently on aspirin 81 MG tablet QD  hydrALAZINE (APRESOLINE) 50 MG tablet TID  atorvastatin (LIPITOR) 20 MG tablet QD  furosemide (LASIX) 80 MG tablet QD  losartan (COZAAR) 25 MG tablet QD  isosorbide dinitrate (ISORDIL) 10 MG tablet QD  carvedilol (COREG) 25 MG tablet BID    3. Type 2 diabetes mellitus with stage 4 chronic kidney disease, without long-term current use of insulin (HCC) Lab Results  Component Value Date   HGBA1C 6.5 04/21/2022   HGBA1C 6.3 (H) 11/12/2021   HGBA1C  6.0 (A) 07/14/2021   Nephrology recently decreased Farxigo from 10mg  to 5mg  QD  Assessment/Plan:   1. Stage 4 chronic kidney disease ([place]) Continue medications per Nephrology Avoid Nephrotoxic substances  2. Hypertension associated with diabetes ([place]) Continue current antihypertensive regime Limit Na+ in diet  3. Type 2 diabetes mellitus with stage 4 chronic kidney disease, without long-term current use of insulin (HCC) Continue current antidiabetic medication regime and reduce sugar/CHO intake  4. Obesity, current BMI 49.12  Stacey Page is currently in the action stage of change. As such, her goal is to continue with weight loss efforts. She has agreed to the Category 2 Plan.   Exercise goals: Older adults should follow the adult guidelines. When older adults cannot meet the adult guidelines, they should be as physically active as their abilities and conditions will allow.  Older adults should do exercises that maintain or improve balance if they are at risk of falling.  Older adults with chronic conditions should understand whether and how their conditions affect their ability to do regular physical activity safely.  Behavioral modification strategies: increasing lean protein intake, decreasing simple carbohydrates, increasing vegetables, increasing water intake, and planning for success.  Stacey Page has agreed to follow-up with our clinic in 4 weeks. She was informed of the importance of frequent follow-up visits to maximize her success with intensive lifestyle modifications for her multiple health conditions.   Objective:   Blood pressure (!) 116/58, pulse (!) 52, temperature  98 F (36.7 C), height 5\' 8"  (1.727 m), weight (!) 327 lb (148.3 kg), SpO2 100 %. Body mass index is 49.72 kg/m.  General: Cooperative, alert, well developed, in no acute distress. HEENT: Conjunctivae and lids unremarkable. Cardiovascular: Regular rhythm.  Lungs: Normal work of breathing. Neurologic: No  focal deficits.   Lab Results  Component Value Date   CREATININE 1.94 (H) 02/22/2022   BUN 26 (H) 02/22/2022   NA 139 02/22/2022   K 4.0 02/22/2022   CL 98 02/22/2022   CO2 31 02/22/2022   Lab Results  Component Value Date   ALT 9 01/25/2022   AST 17 01/25/2022   ALKPHOS 65 01/25/2022   BILITOT 0.4 01/25/2022   Lab Results  Component Value Date   HGBA1C 6.5 04/21/2022   HGBA1C 6.3 (H) 11/12/2021   HGBA1C 6.0 (A) 07/14/2021   HGBA1C 6.4 (A) 04/14/2021   HGBA1C 6.1 (H) 10/14/2020   Lab Results  Component Value Date   INSULIN 8.6 10/28/2019   INSULIN 9.2 07/04/2019   INSULIN 8.8 03/25/2019   Lab Results  Component Value Date   TSH 0.51 11/28/2018   Lab Results  Component Value Date   CHOL 144 01/14/2022   HDL 57.10 01/14/2022   LDLCALC 73 01/14/2022   TRIG 66.0 01/14/2022   CHOLHDL 3 01/14/2022   Lab Results  Component Value Date   VD25OH 57.6 10/28/2019   VD25OH 41.5 07/04/2019   VD25OH 38.2 03/25/2019   Lab Results  Component Value Date   WBC 9.5 02/01/2022   HGB 12.3 02/01/2022   HCT 38.3 02/01/2022   MCV 90.1 02/01/2022   PLT 287 02/01/2022   Lab Results  Component Value Date   IRON 29 01/25/2022   TIBC 260 01/25/2022   FERRITIN 51 01/25/2022    Attestation Statements:   Reviewed by clinician on day of visit: allergies, medications, problem list, medical history, surgical history, family history, social history, and previous encounter notes.  I have reviewed the above documentation for accuracy and completeness, and I agree with the above. -  Stacey Page d. Stacey Steinert, NP-C

## 2022-05-09 ENCOUNTER — Other Ambulatory Visit: Payer: Self-pay

## 2022-05-12 DIAGNOSIS — J9602 Acute respiratory failure with hypercapnia: Secondary | ICD-10-CM | POA: Diagnosis not present

## 2022-05-12 DIAGNOSIS — J45909 Unspecified asthma, uncomplicated: Secondary | ICD-10-CM | POA: Diagnosis not present

## 2022-05-12 DIAGNOSIS — J9611 Chronic respiratory failure with hypoxia: Secondary | ICD-10-CM | POA: Diagnosis not present

## 2022-05-13 ENCOUNTER — Ambulatory Visit: Payer: HMO | Admitting: Psychology

## 2022-05-16 ENCOUNTER — Other Ambulatory Visit: Payer: Self-pay

## 2022-05-16 ENCOUNTER — Other Ambulatory Visit (HOSPITAL_COMMUNITY): Payer: Self-pay

## 2022-05-17 ENCOUNTER — Other Ambulatory Visit: Payer: Self-pay

## 2022-05-17 ENCOUNTER — Encounter (INDEPENDENT_AMBULATORY_CARE_PROVIDER_SITE_OTHER): Payer: No Typology Code available for payment source | Admitting: Ophthalmology

## 2022-05-17 ENCOUNTER — Encounter (INDEPENDENT_AMBULATORY_CARE_PROVIDER_SITE_OTHER): Payer: Self-pay

## 2022-05-17 ENCOUNTER — Other Ambulatory Visit (HOSPITAL_COMMUNITY): Payer: Self-pay

## 2022-05-17 DIAGNOSIS — H35032 Hypertensive retinopathy, left eye: Secondary | ICD-10-CM | POA: Diagnosis not present

## 2022-05-17 DIAGNOSIS — H2513 Age-related nuclear cataract, bilateral: Secondary | ICD-10-CM | POA: Diagnosis not present

## 2022-05-17 DIAGNOSIS — H35031 Hypertensive retinopathy, right eye: Secondary | ICD-10-CM | POA: Diagnosis not present

## 2022-05-17 DIAGNOSIS — E119 Type 2 diabetes mellitus without complications: Secondary | ICD-10-CM | POA: Diagnosis not present

## 2022-05-17 DIAGNOSIS — H34822 Venous engorgement, left eye: Secondary | ICD-10-CM | POA: Diagnosis not present

## 2022-05-23 ENCOUNTER — Telehealth: Payer: Self-pay | Admitting: Family Medicine

## 2022-05-23 ENCOUNTER — Ambulatory Visit: Payer: No Typology Code available for payment source | Admitting: Podiatry

## 2022-05-23 NOTE — Telephone Encounter (Signed)
Prescription Request  05/23/2022  LOV: 04/21/2022  What is the name of the medication or equipment? Lasix  Pt requesting 90 day supply. She does not want to refill with 30 day supply again. She has about 5 days of meds right now  Have you contacted your pharmacy to request a refill? Yes - needs new RX  Which pharmacy would you like this sent to?  [facility] [address] [place] [address] Phone: [phone] Fax: [phone]    Patient notified that their request is being sent to the clinical staff for review and that they should receive a response within 2 business days.   Please advise at Mobile [phone] (mobile)

## 2022-05-24 ENCOUNTER — Other Ambulatory Visit: Payer: Self-pay

## 2022-05-24 ENCOUNTER — Other Ambulatory Visit (HOSPITAL_COMMUNITY): Payer: Self-pay

## 2022-05-24 ENCOUNTER — Telehealth: Payer: Self-pay | Admitting: Family Medicine

## 2022-05-24 MED ORDER — FUROSEMIDE 80 MG PO TABS
80.0000 mg | ORAL_TABLET | Freq: Every day | ORAL | 1 refills | Status: DC
  Filled 2022-05-24: qty 90, 90d supply, fill #0
  Filled 2022-08-29: qty 90, 90d supply, fill #1

## 2022-05-24 NOTE — Telephone Encounter (Signed)
Contacted Stacey Page to schedule their annual wellness visit. Appointment made for 05/27/22.  Stacey Page AWV direct phone # [phone]

## 2022-05-26 ENCOUNTER — Ambulatory Visit (INDEPENDENT_AMBULATORY_CARE_PROVIDER_SITE_OTHER): Payer: HMO | Admitting: Psychology

## 2022-05-26 DIAGNOSIS — F330 Major depressive disorder, recurrent, mild: Secondary | ICD-10-CM | POA: Diagnosis not present

## 2022-05-26 DIAGNOSIS — F411 Generalized anxiety disorder: Secondary | ICD-10-CM

## 2022-05-27 ENCOUNTER — Ambulatory Visit: Payer: Self-pay

## 2022-05-27 ENCOUNTER — Ambulatory Visit (INDEPENDENT_AMBULATORY_CARE_PROVIDER_SITE_OTHER): Payer: HMO

## 2022-05-27 VITALS — Ht 68.0 in | Wt 328.0 lb

## 2022-05-27 DIAGNOSIS — Z0000 Encounter for general adult medical examination without abnormal findings: Secondary | ICD-10-CM

## 2022-05-27 DIAGNOSIS — I5032 Chronic diastolic (congestive) heart failure: Secondary | ICD-10-CM

## 2022-05-27 DIAGNOSIS — Z789 Other specified health status: Secondary | ICD-10-CM

## 2022-05-27 DIAGNOSIS — J849 Interstitial pulmonary disease, unspecified: Secondary | ICD-10-CM

## 2022-05-27 NOTE — Progress Notes (Signed)
     [facility] Counselor/Therapist Progress Note  Patient ID: Stacey Page, MRN: [id number],    Date: 05/26/2022  Time Spent: 60 minutes  Treatment Type: Individual Therapy  Reported Symptoms: decreased motivation, feeling a little anxious, sadness  Mental Status Exam: Appearance:  Casual     Behavior: Appropriate  Motor: Normal  Speech/Language:  Pressured  Affect: Blunt  Mood: depressed  Thought process: normal  Thought content:   WNL  Sensory/Perceptual disturbances:   WNL  Orientation: oriented to person, place, time/date, and situation  Attention: Good  Concentration: Good  Memory: WNL  Fund of knowledge:  Good  Insight:   Good  Judgment:  Good  Impulse Control: Good   Risk Assessment: Danger to Self:  No Self-injurious Behavior: No Danger to Others: No Duty to Warn:no Physical Aggression / Violence:No  Access to Firearms a concern: No  Gang Involvement:No   Subjective: The patient attended an individual therapy session in the office today.  The patient presents with a blunted affect and mood is frustrated.  Today she talked about her sister that is in [place] and her sister's son who happens to have developmental delays.  She reports that her sister had some sort of surgery and left her grown son who has to delays at home alone and now he is very frustrated and upset and she is down here and not able to do much about it.  We did some problem solving and talked about her possibly having to call DSS if things do not resolve themselves pretty soon.  Provided supportive therapy for the patient as well as some cognitive behavioral therapy Interventions: Cognitive Behavioral Therapy, Assertiveness/Communication, and Insight-Oriented  Diagnosis:Major depressive disorder, recurrent episode, mild  Generalized anxiety disorder  Plan: " I need someone to talk to" Supportive family, intelligent Objective Learn and implement problem-solving strategies  for realistically addressing worries. Target Date: 04/21/2023 Frequency: Biweekly Progress: 0 Modality: individual Related Interventions 1. Teach the client problem-solving strategies involving specifically defining a problem,  generating options for addressing it, evaluating the pros and cons of each option, selecting and  implementing an optional action, and reevaluating and refining the action (or assign "Applying  Problem-Solving to Interpersonal Conflict" in the Adult Psychotherapy Homework Planner by  Stephannie Li). Objective Describe situations, thoughts, feelings, and actions associated with anxieties and worries, their impact  on functioning, and attempts to resolve them. Target Date: 04/21/2023 Frequency: Biweekly Progress: 0 Modality: individual Diagnosis  300.02 (Generalized anxiety disorder)  F33.0 (Major depressive affective disorder, recurrent episode, mild Conditions For Discharge Achievement of treatment goals and objectives   Lindon Kiel G Ramiya Delahunty, LCSW

## 2022-05-27 NOTE — Patient Instructions (Signed)
Visit Information  Thank you for taking time to visit with me today. Please don't hesitate to contact me if I can be of assistance to you.   Following are the goals we discussed today:   Goals Addressed             This Visit's Progress    I want to monitor and manage my CHF       Patient Goals/Self Care Activities: -Patient/Caregiver will self-administer medications as prescribed as evidenced by self-report/primary caregiver report  -Patient/Caregiver will attend all scheduled provider appointments as evidenced by clinician review of documented attendance to scheduled appointments and patient/caregiver report -Patient/Caregiver will call pharmacy for medication refills as evidenced by patient report and review of pharmacy fill history as appropriate -Patient/Caregiver will call provider office for new concerns or questions as evidenced by review of documented incoming telephone call notes and patient report -Patient/Caregiver verbalizes understanding of plan -Patient/Caregiver will focus on medication adherence by taking medications as prescribed  -Weigh daily and record (notify MD with 3 lb weight gain over night or 5 lb in a week) -Follow CHF Action Plan- do a check daily --Continue with light exercise, doing chair exercises and walking in the house -Be careful in the heat and wear a mask due to the pollen when you go out         Our next appointment is by telephone on 06/28/22 at 1100 am  Please call the care guide team at [phone] if you need to cancel or reschedule your appointment.   If you are experiencing a Mental Health or Behavioral Health Crisis or need someone to talk to, please call 1-800-273-TALK (toll free, 24 hour hotline)  Patient verbalizes understanding of instructions and care plan provided today and agrees to view in MyChart. Active MyChart status and patient understanding of how to access instructions and care plan via MyChart confirmed with patient.       Juanell Fairly RN, BSN, [facility] Care Coordinator [facility]   Phone: [phone]

## 2022-05-27 NOTE — Progress Notes (Signed)
I connected with  Stacey Page on 05/27/22 by a audio enabled telemedicine application and verified that I am speaking with the correct person using two identifiers.  Patient Location: Home  Provider Location: Office/Clinic  I discussed the limitations of evaluation and management by telemedicine. The patient expressed understanding and agreed to proceed.  Subjective:   Stacey Page is a 72 y.o. female who presents for Medicare Annual (Subsequent) preventive examination.  Review of Systems     Cardiac Risk Factors include: advanced age (>2men, >38 women);diabetes mellitus;dyslipidemia;hypertension;obesity (BMI >30kg/m2)     Objective:    Today's Vitals   05/27/22 1027  Weight: (!) 328 lb (148.8 kg)  Height: 5\' 8"  (1.727 m)   Body mass index is 49.87 kg/m.     05/27/2022   10:32 AM 02/01/2022   12:39 PM 10/09/2021    4:38 PM 08/17/2021   11:14 AM 11/16/2020   11:31 AM 10/21/2020   10:10 AM 08/04/2020   11:27 AM  Advanced Directives  Does Patient Have a Medical Advance Directive? No No No No No No No  Would patient like information on creating a medical advance directive?   No - Patient declined No - Patient declined No - Patient declined No - Patient declined No - Patient declined    Current Medications (verified) Outpatient Encounter Medications as of 05/27/2022  Medication Sig   albuterol (VENTOLIN HFA) 108 (90 Base) MCG/ACT inhaler INHALE 1-2 PUFFS into THE lungs EVERY 6 HOURS AS NEEDED FOR WHEEZING AND/OR SHORTNESS OF BREATH   aspirin 81 MG tablet Take 1 tablet (81 mg total) by mouth daily with breakfast.   atorvastatin (LIPITOR) 20 MG tablet Take 1 tablet (20 mg total) by mouth daily.   Blood Glucose Monitoring Suppl (ONETOUCH VERIO [facility] SYSTEM) w/Device KIT 1 kit by Does not apply route daily as needed.   carvedilol (COREG) 25 MG tablet Take 1 tablet (25 mg total) by mouth 2 (two) times daily.   cholecalciferol (VITAMIN D3) 25 MCG (1000 UNIT) tablet Take 2,000  Units by mouth daily.   Continuous Blood Gluc Sensor (FREESTYLE LIBRE 2 SENSOR) MISC Place sensor on the arm every 14 days and use to monitor blood sugars continuously as directed.   dapagliflozin propanediol (FARXIGA) 5 MG TABS tablet Take 1 tablet (5 mg total) by mouth daily before breakfast.   Docusate Sodium 100 MG capsule Take 100 mg by mouth 2 (two) times daily.   ferrous sulfate 325 (65 FE) MG tablet Take 325 mg by mouth 2 (two) times daily with a meal.   fluticasone (FLONASE) 50 MCG/ACT nasal spray Place 1 spray into both nostrils daily.   furosemide (LASIX) 80 MG tablet Take 1 tablet (80 mg total) by mouth daily.   gabapentin (NEURONTIN) 100 MG capsule Take 1 capsule (100 mg total) by mouth 2 (two) times daily.   gabapentin (NEURONTIN) 100 MG capsule TAKE ONE CAPSULE BY MOUTH TWICE DAILY   glucose blood (ONETOUCH VERIO) test strip Use to monitor blood sugar twice daily as instructed   hydrALAZINE (APRESOLINE) 50 MG tablet Take 1 tablet (50 mg total) by mouth every 8 (eight) hours.   isosorbide dinitrate (ISORDIL) 10 MG tablet Take 1 tablet (10 mg total) by mouth 3 (three) times daily. Please keep scheduled appointment   losartan (COZAAR) 25 MG tablet Take 1 tablet (25 mg total) by mouth daily.   montelukast (SINGULAIR) 10 MG tablet Take 1 tablet (10 mg total) by mouth at bedtime.   OneTouch Delica Lancets 30G  MISC Use to monitor blood sugars once daily as directed   pantoprazole (PROTONIX) 40 MG tablet Take 1 tablet (40 mg total) by mouth daily.   potassium chloride (KLOR-CON M) 10 MEQ tablet Take 1 tablet (10 mEq total) by mouth daily.   No facility-administered encounter medications on file as of 05/27/2022.    Allergies (verified) Patient has no known allergies.   History: Past Medical History:  Diagnosis Date   Anxiety    doesn't take any meds   Arthritis of right knee 03/14/2016   Asthma    Back pain    Chronic diastolic CHF (congestive heart failure)    HF with  Preserved EF (60-65%) - Grade II Diastolic Dysfunction (Hypertensive Heart Disease). takes Furosemide daily   Depression    doesn't take meds   Diabetes    takes Januvia daily   Eczema    uses cream as needed   GERD (gastroesophageal reflux disease)    History of bronchitis as a child    HTN (hypertension)    Insomnia    Moderate aortic stenosis by prior echocardiogram 04/2017   10/15/2021: Stacey Page calcification with moderate AS, MG 23 mmHg   Mycobacterium avium-intracellulare infection 2016   OA (osteoarthritis)    Obese    OSA (obstructive sleep apnea) 02/25/2014   wears CPAP at night   Peripheral neuropathy    takes Gabapentin as needed   Pneumonia    hx of-2010   Seasonal allergies    uses Flonase daily   Sleep apnea    Past Surgical History:  Procedure Laterality Date   BREAST BIOPSY Right 03/2018   BREAST LUMPECTOMY WITH RADIOACTIVE SEED LOCALIZATION Right 12/28/2018   Procedure: RIGHT BREAST LUMPECTOMY WITH RADIOACTIVE SEED LOCALIZATION;  Surgeon: Griselda Miner, MD;  Location: [facility] OR;  Service: General;  Laterality: Right;   BREAST SURGERY     CARDIOPULMONARY EXERCISE TEST (CPX)  06/21/2021   (Performed on Coreg 25 mg twice daily): PFTs: FVC 1.76 (60%), FEV1 1.59 (77%), ratio 114% => RESTRICTIVE PHYSIOLOGY; 5 min- 1 mph - 2% grade. 7/10 dyspnea -> pulse ox range during exercise 95 to 98%, low of 90%, 97% on recovery; BP 106/84-160/64; heart rate 64-90 bpm (60% MPHR) -> Chronotropic Incompetence;;MAIN FACTOR for Exercise Limitation = BODY HABITUS w/ Chronotropic Incompetence.   CESAREAN SECTION  x2   COLONOSCOPY     CORONARY CALCIUM SCORE & CT ANGIOGRAM  09/2017   Coronary Ca Score = 11 (low).  CTA- no obstructive CAD (minimal disease)   JOINT REPLACEMENT     KNEE ARTHROSCOPY Right    LUNG BIOPSY Right 06/10/2014   Procedure: LUNG BIOPSY;  Surgeon: Kerin Perna, MD;  Location: [facility] OR;  Service: Thoracic;  Laterality: Right;   POLYPECTOMY     throat   RIGHT HEART CATH  N/A 10/15/2021   Procedure: RIGHT HEART CATH;  Surgeon: Lyn Records, MD;  Location: [facility] INVASIVE CV LAB;  Service: CV: Mild Pulmonary Hypertension (WHO Group 3).  Mean PAP 28 mmHg, PCWP 13 mmHg.  PVR 2.73 [place].  RAP 6 mmHg.CO-CI 5.5L/min, 2.17 L/min/m.   TOTAL KNEE ARTHROPLASTY Right 03/14/2016   Procedure: RIGHT TOTAL KNEE ARTHROPLASTY;  Surgeon: Eldred Manges, MD;  Location: [facility] OR;  Service: Orthopedics;  Laterality: Right;   TRANSTHORACIC ECHOCARDIOGRAM  10/10/2021   EF 70 to 75%.  Hyperdynamic with no RWMA.  Moderate LVH.  Moderate LA dilation indicating likely diastolic dysfunction although not interpretable.  Moderately elevated PAP with normal RV function,  dilated IVC with elevated RAP estimated at 15 mmHg..  Moderate MAC with no MR.  AOV calcification with Moderate AS-mean AvG 23 mmHg.   TRANSTHORACIC ECHOCARDIOGRAM  06/14/2021   EF 60 to 65%.  No RWMA.  Moderate cLVH with GR 1 DD.  Mild LA dilation.  Mild to moderate aortic calcification/stenosis.  Mean AVG 18 mmHg.   VIDEO ASSISTED THORACOSCOPY Right 06/10/2014   Procedure: VIDEO ASSISTED THORACOSCOPY;  Surgeon: Kerin Perna, MD;  Location: [facility] OR;  Service: Thoracic;  Laterality: Right;   Family History  Problem Relation Age of Onset   COPD Father    Hypothyroidism Father    Anemia Father        iron deficiency   High blood pressure Father    Alcoholism Father    Cancer Mother 23       pancreatic   High blood pressure Mother    High Cholesterol Mother    Breast cancer Neg Hx    Social History   Socioeconomic History   Marital status: Widowed    Spouse name: Not on file   Number of children: Not on file   Years of education: Not on file   Highest education level: Not on file  Occupational History   Occupation: retired  Tobacco Use   Smoking status: Former    Packs/day: 0.25    Years: 15.00    Additional pack years: 0.00    Total pack years: 3.75    Types: Cigarettes    Quit date: 02/08/1980    Years since  quitting: 42.3   Smokeless tobacco: Former  Building services engineer Use: Never used  Substance and Sexual Activity   Alcohol use: Yes    Alcohol/week: 0.0 standard drinks of alcohol    Comment: occassional/social/rare   Drug use: Not Currently   Sexual activity: Not Currently  Other Topics Concern   Not on file  Social History Narrative   Not on file   Social Determinants of Health   Financial Resource Strain: Low Risk  (05/27/2022)   Overall Financial Resource Strain (CARDIA)    Difficulty of Paying Living Expenses: Not hard at all  Food Insecurity: No Food Insecurity (05/27/2022)   Hunger Vital Sign    Worried About Running Out of Food in the Last Year: Never true    Ran Out of Food in the Last Year: Never true  Transportation Needs: No Transportation Needs (05/27/2022)   PRAPARE - Administrator, Civil Service (Medical): No    Lack of Transportation (Non-Medical): No  Physical Activity: Inactive (05/27/2022)   Exercise Vital Sign    Days of Exercise per Week: 0 days    Minutes of Exercise per Session: 0 min  Stress: No Stress Concern Present (05/27/2022)   [employer] of Occupational Health - Occupational Stress Questionnaire    Feeling of Stress : Not at all  Social Connections: Socially Isolated (08/17/2021)   Social Connection and Isolation Panel [NHANES]    Frequency of Communication with Friends and Family: Three times a week    Frequency of Social Gatherings with Friends and Family: Three times a week    Attends Religious Services: Never    Active Member of Clubs or Organizations: No    Attends Banker Meetings: Never    Marital Status: Widowed    Tobacco Counseling Counseling given: Not Answered   Clinical Intake:  Pre-visit preparation completed: Yes  Pain : No/denies pain  Nutritional Status: BMI > 30  Obese Nutritional Risks: None Diabetes: Yes  How often do you need to have someone help you when you read instructions,  pamphlets, or other written materials from your doctor or pharmacy?: 1 - Never  Diabetic? Yes Nutrition Risk Assessment:  Has the patient had any N/V/D within the last 2 months?  No  Does the patient have any non-healing wounds?  No  Has the patient had any unintentional weight loss or weight gain?  No   Diabetes:  Is the patient diabetic?  Yes  If diabetic, was a CBG obtained today?  No  Did the patient bring in their glucometer from home?  No  How often do you monitor your CBG's? daily.   Financial Strains and Diabetes Management:  Are you having any financial strains with the device, your supplies or your medication? No .  Does the patient want to be seen by Chronic Care Management for management of their diabetes?  No  Would the patient like to be referred to a Nutritionist or for Diabetic Management?  No   Diabetic Exams:  Diabetic Eye Exam: Completed 05/17/2022 Diabetic Foot Exam: Completed 01/13/2022   Interpreter Needed?: No  Information entered by :: NAllen LPN   Activities of Daily Living    05/27/2022   10:35 AM 08/17/2021   11:19 AM  In your present state of health, do you have any difficulty performing the following activities:  Hearing? 0 0  Comment sometimes   Vision? 1 0  Difficulty concentrating or making decisions? 0 0  Walking or climbing stairs? 1 0  Dressing or bathing? 0 0  Doing errands, shopping? 0 0  Preparing Food and eating ? N N  Using the Toilet? N N  In the past six months, have you accidently leaked urine? N N  Do you have problems with loss of bowel control? N N  Managing your Medications? N N  Managing your Finances? N N  Housekeeping or managing your Housekeeping? N N    Patient Care Team: Mliss Sax, MD as PCP - General (Family Medicine) Marykay Lex, MD as PCP - Cardiology (Cardiology) Gaspar Cola, [facility] as Pharmacist (Pharmacist) Duke, Roe Rutherford, PA as Physician Assistant (Cardiology) Roger Kill, NP as Nurse Practitioner (Nephrology) Juanell Fairly, RN as [facility] Care Management  Indicate any recent Medical Services you may have received from other than [facility] providers in the past year (date may be approximate).     Assessment:   This is a routine wellness examination for Aura.  Hearing/Vision screen Vision Screening - Comments:: Regular eye exams,   Dietary issues and exercise activities discussed: Current Exercise Habits: The patient does not participate in regular exercise at present   Goals Addressed             This Visit's Progress    Patient Stated       05/27/2022, wants to lose weight       Depression Screen    05/27/2022   10:35 AM 04/21/2022    2:42 PM 03/24/2022    8:13 AM 03/01/2022    1:19 PM 02/22/2022    8:46 AM 01/26/2022    9:24 AM 01/13/2022    1:09 PM  PHQ 2/9 Scores  PHQ - 2 Score 0 0 0 0 0 0 0  PHQ- 9 Score     2      Fall Risk    05/27/2022   10:35 AM  04/21/2022    2:42 PM 03/24/2022    8:13 AM 03/01/2022    1:19 PM 02/22/2022    8:13 AM  Fall Risk   Falls in the past year? 0 0 0 0 1  Number falls in past yr: 0 0 0 0 1  Injury with Fall? 0 0 0 0 0  Risk for fall due to : Medication side effect;Impaired mobility No Fall Risks No Fall Risks No Fall Risks History of fall(s)  Follow up Falls prevention discussed;Education provided;Falls evaluation completed Falls evaluation completed Falls evaluation completed Falls evaluation completed Falls evaluation completed    FALL RISK PREVENTION PERTAINING TO THE HOME:  Any stairs in or around the home? No  If so, are there any without handrails? N/a Home free of loose throw rugs in walkways, pet beds, electrical cords, etc? Yes  Adequate lighting in your home to reduce risk of falls? Yes   ASSISTIVE DEVICES UTILIZED TO PREVENT FALLS:  Life alert? No  Use of a cane, walker or w/c? Yes  Grab bars in the bathroom? Yes  Shower chair or bench in shower? No  Elevated toilet  seat or a handicapped toilet? No   TIMED UP AND GO:  Was the test performed? No .      Cognitive Function:    11/28/2018   11:19 AM  MMSE - Mini Mental State Exam  Orientation to time 5  Orientation to Place 5  Registration 3  Attention/ Calculation 5  Recall 3  Language- name 2 objects 2  Language- repeat 1  Language- follow 3 step command 3  Language- read & follow direction 1  Write a sentence 1  Copy design 1  Total score 30        05/27/2022   10:38 AM 07/31/2019    8:34 AM  6CIT Screen  What Year? 0 points 0 points  What month? 0 points 0 points  What time? 0 points 0 points  Count back from 20 0 points 0 points  Months in reverse 0 points 0 points  Repeat phrase 2 points 0 points  Total Score 2 points 0 points    Immunizations Immunization History  Administered Date(s) Administered   Fluad Quad(high Dose 65+) 11/28/2018, 01/29/2020   Influenza Split 11/21/2016   Influenza, High Dose Seasonal PF 12/02/2021   Influenza,inj,Quad PF,6+ Mos 01/12/2015, 11/19/2015   PFIZER(Purple Top)SARS-COV-2 Vaccination 03/31/2019, 04/23/2019, 03/21/2020   Pneumococcal Conjugate-13 01/19/2017   Pneumococcal Polysaccharide-23 01/12/2015, 05/14/2020   Zoster Recombinat (Shingrix) 07/14/2021, 12/02/2021    TDAP status: Due, Education has been provided regarding the importance of this vaccine. Advised may receive this vaccine at local pharmacy or Health Dept. Aware to provide a copy of the vaccination record if obtained from local pharmacy or Health Dept. Verbalized acceptance and understanding.  Flu Vaccine status: Up to date  Pneumococcal vaccine status: Up to date  Covid-19 vaccine status: Completed vaccines  Qualifies for Shingles Vaccine? Yes   Zostavax completed Yes   Shingrix Completed?: Yes  Screening Tests Health Maintenance  Topic Date Due   Diabetic kidney evaluation - Urine ACR  06/26/2022   Medicare Annual Wellness (AWV)  08/18/2022   MAMMOGRAM   09/30/2022   HEMOGLOBIN A1C  10/22/2022   FOOT EXAM  01/14/2023   Diabetic kidney evaluation - eGFR measurement  02/23/2023   OPHTHALMOLOGY EXAM  05/17/2023   Fecal DNA (Cologuard)  11/12/2023   Pneumonia Vaccine 53+ Years old  Completed   DEXA SCAN  Completed  Hepatitis C Screening  Completed   HPV VACCINES  Aged Out   DTaP/Tdap/Td  Discontinued   INFLUENZA VACCINE  Discontinued   COVID-19 Vaccine  Discontinued   Zoster Vaccines- Shingrix  Discontinued    Health Maintenance  Health Maintenance Due  Topic Date Due   Diabetic kidney evaluation - Urine ACR  06/26/2022    Colorectal cancer screening: Type of screening: Cologuard. Completed 11/11/2020. Repeat every 3 years  Mammogram status: Completed 09/29/2021. Repeat every year  Bone Density status: Completed 09/23/2019.   Lung Cancer Screening: (Low Dose CT Chest recommended if Age 55-80 years, 30 pack-year currently smoking OR have quit w/in 15years.) does not qualify.   Lung Cancer Screening Referral: no  Additional Screening:  Hepatitis C Screening: does qualify; Completed 12/15/2015  Vision Screening: Recommended annual ophthalmology exams for early detection of glaucoma and other disorders of the eye. Is the patient up to date with their annual eye exam?  Yes  Who is the provider or what is the name of the office in which the patient attends annual eye exams? Can't remember name If pt is not established with a provider, would they like to be referred to a provider to establish care? No .   Dental Screening: Recommended annual dental exams for proper oral hygiene  Community Resource Referral / Chronic Care Management: CRR required this visit?  No   CCM required this visit?  No      Plan:     I have personally reviewed and noted the following in the patient's chart:   Medical and social history Use of alcohol, tobacco or illicit drugs  Current medications and supplements including opioid prescriptions.  Patient is not currently taking opioid prescriptions. Functional ability and status Nutritional status Physical activity Advanced directives List of other physicians Hospitalizations, surgeries, and ER visits in previous 12 months Vitals Screenings to include cognitive, depression, and falls Referrals and appointments  In addition, I have reviewed and discussed with patient certain preventive protocols, quality metrics, and best practice recommendations. A written personalized care plan for preventive services as well as general preventive health recommendations were provided to patient.     Barb Merino, LPN   1/61/0960   Nurse Notes: none  Due to this being a virtual visit, the after visit summary with patients personalized plan was offered to patient via mail or my-chart.  Patient would like to access on my-chart

## 2022-05-27 NOTE — Patient Instructions (Signed)
[name] , Thank you for taking time to come for your Medicare Wellness Visit. I appreciate your ongoing commitment to your health goals. Please review the following plan we discussed and let me know if I can assist you in the future.   These are the goals we discussed:  Goals      I want to monitor and manage my CHF     Patient Goals/Self Care Activities: -Patient/Caregiver will self-administer medications as prescribed as evidenced by self-report/primary caregiver report  -Patient/Caregiver will attend all scheduled provider appointments as evidenced by clinician review of documented attendance to scheduled appointments and patient/caregiver report -Patient/Caregiver will call pharmacy for medication refills as evidenced by patient report and review of pharmacy fill history as appropriate -Patient/Caregiver will call provider office for new concerns or questions as evidenced by review of documented incoming telephone call notes and patient report -Patient/Caregiver verbalizes understanding of plan -Patient/Caregiver will focus on medication adherence by taking medications as prescribed  -Weigh daily and record (notify MD with 3 lb weight gain over night or 5 lb in a week) -Follow CHF Action Plan- do a check daily --Continue with light exercise, doing chair exercises and walking in the house      Patient Stated     Loose weight 10lb     Patient Stated     05/27/2022, wants to lose weight     Track and Manage Fluids and Swelling-Heart Failure     Timeframe:  Long-Range Goal Priority:  High Start Date: 04/29/21                            Expected End Date: 04/30/22                      Follow Up within 90 days   - call office if I gain more than 2 pounds in one day or 5 pounds in one week - keep legs up while sitting - track weight in diary - use salt in moderation    Why is this important?   It is important to check your weight daily and watch how much salt and liquids you have.   It will help you to manage your heart failure.    Notes:      Weight (lb) < 200 lb (90.7 kg)     Using healthy weight & wellness program & going to the gym        This is a list of the screening recommended for you and due dates:  Health Maintenance  Topic Date Due   Yearly kidney health urinalysis for diabetes  06/26/2022   Mammogram  09/30/2022   Hemoglobin A1C  10/22/2022   Complete foot exam   01/14/2023   Yearly kidney function blood test for diabetes  02/23/2023   Eye exam for diabetics  05/17/2023   Medicare Annual Wellness Visit  05/27/2023   Cologuard (Stool DNA test)  11/12/2023   Pneumonia Vaccine  Completed   DEXA scan (bone density measurement)  Completed   Hepatitis C Screening: USPSTF Recommendation to screen - Ages 100-79 yo.  Completed   HPV Vaccine  Aged Out   DTaP/Tdap/Td vaccine  Discontinued   Flu Shot  Discontinued   COVID-19 Vaccine  Discontinued   Zoster (Shingles) Vaccine  Discontinued    Advanced directives: Advance directive discussed with you today.   Conditions/risks identified: none  Next appointment: Follow up in one year  for your annual wellness visit    Preventive Care 65 Years and Older, Female Preventive care refers to lifestyle choices and visits with your health care provider that can promote health and wellness. What does preventive care include? A yearly physical exam. This is also called an annual well check. Dental exams once or twice a year. Routine eye exams. Ask your health care provider how often you should have your eyes checked. Personal lifestyle choices, including: Daily care of your teeth and gums. Regular physical activity. Eating a healthy diet. Avoiding tobacco and drug use. Limiting alcohol use. Practicing safe sex. Taking low-dose aspirin every day. Taking vitamin and mineral supplements as recommended by your health care provider. What happens during an annual well check? The services and screenings done by  your health care provider during your annual well check will depend on your age, overall health, lifestyle risk factors, and family history of disease. Counseling  Your health care provider may ask you questions about your: Alcohol use. Tobacco use. Drug use. Emotional well-being. Home and relationship well-being. Sexual activity. Eating habits. History of falls. Memory and ability to understand (cognition). Work and work Astronomer. Reproductive health. Screening  You may have the following tests or measurements: Height, weight, and BMI. Blood pressure. Lipid and cholesterol levels. These may be checked every 5 years, or more frequently if you are over 81 years old. Skin check. Lung cancer screening. You may have this screening every year starting at age 51 if you have a 30-pack-year history of smoking and currently smoke or have quit within the past 15 years. Fecal occult blood test (FOBT) of the stool. You may have this test every year starting at age 3. Flexible sigmoidoscopy or colonoscopy. You may have a sigmoidoscopy every 5 years or a colonoscopy every 10 years starting at age 64. Hepatitis C blood test. Hepatitis B blood test. Sexually transmitted disease (STD) testing. Diabetes screening. This is done by checking your blood sugar (glucose) after you have not eaten for a while (fasting). You may have this done every 1-3 years. Bone density scan. This is done to screen for osteoporosis. You may have this done starting at age 7. Mammogram. This may be done every 1-2 years. Talk to your health care provider about how often you should have regular mammograms. Talk with your health care provider about your test results, treatment options, and if necessary, the need for more tests. Vaccines  Your health care provider may recommend certain vaccines, such as: Influenza vaccine. This is recommended every year. Tetanus, diphtheria, and acellular pertussis (Tdap, Td) vaccine. You  may need a Td booster every 10 years. Zoster vaccine. You may need this after age 66. Pneumococcal 13-valent conjugate (PCV13) vaccine. One dose is recommended after age 5. Pneumococcal polysaccharide (PPSV23) vaccine. One dose is recommended after age 62. Talk to your health care provider about which screenings and vaccines you need and how often you need them. This information is not intended to replace advice given to you by your health care provider. Make sure you discuss any questions you have with your health care provider. Document Released: 02/20/2015 Document Revised: 10/14/2015 Document Reviewed: 11/25/2014 Elsevier Interactive Patient Education  2017 [employer].  Fall Prevention in the Home Falls can cause injuries. They can happen to people of all ages. There are many things you can do to make your home safe and to help prevent falls. What can I do on the outside of my home? Regularly fix the edges of  walkways and driveways and fix any cracks. Remove anything that might make you trip as you walk through a door, such as a raised step or threshold. Trim any bushes or trees on the path to your home. Use bright outdoor lighting. Clear any walking paths of anything that might make someone trip, such as rocks or tools. Regularly check to see if handrails are loose or broken. Make sure that both sides of any steps have handrails. Any raised decks and porches should have guardrails on the edges. Have any leaves, snow, or ice cleared regularly. Use sand or salt on walking paths during winter. Clean up any spills in your garage right away. This includes oil or grease spills. What can I do in the bathroom? Use night lights. Install grab bars by the toilet and in the tub and shower. Do not use towel bars as grab bars. Use non-skid mats or decals in the tub or shower. If you need to sit down in the shower, use a plastic, non-slip stool. Keep the floor dry. Clean up any water that spills  on the floor as soon as it happens. Remove soap buildup in the tub or shower regularly. Attach bath mats securely with double-sided non-slip rug tape. Do not have throw rugs and other things on the floor that can make you trip. What can I do in the bedroom? Use night lights. Make sure that you have a light by your bed that is easy to reach. Do not use any sheets or blankets that are too big for your bed. They should not hang down onto the floor. Have a firm chair that has side arms. You can use this for support while you get dressed. Do not have throw rugs and other things on the floor that can make you trip. What can I do in the kitchen? Clean up any spills right away. Avoid walking on wet floors. Keep items that you use a lot in easy-to-reach places. If you need to reach something above you, use a strong step stool that has a grab bar. Keep electrical cords out of the way. Do not use floor polish or wax that makes floors slippery. If you must use wax, use non-skid floor wax. Do not have throw rugs and other things on the floor that can make you trip. What can I do with my stairs? Do not leave any items on the stairs. Make sure that there are handrails on both sides of the stairs and use them. Fix handrails that are broken or loose. Make sure that handrails are as long as the stairways. Check any carpeting to make sure that it is firmly attached to the stairs. Fix any carpet that is loose or worn. Avoid having throw rugs at the top or bottom of the stairs. If you do have throw rugs, attach them to the floor with carpet tape. Make sure that you have a light switch at the top of the stairs and the bottom of the stairs. If you do not have them, ask someone to add them for you. What else can I do to help prevent falls? Wear shoes that: Do not have high heels. Have rubber bottoms. Are comfortable and fit you well. Are closed at the toe. Do not wear sandals. If you use a stepladder: Make  sure that it is fully opened. Do not climb a closed stepladder. Make sure that both sides of the stepladder are locked into place. Ask someone to hold it for you, if  possible. Clearly mark and make sure that you can see: Any grab bars or handrails. First and last steps. Where the edge of each step is. Use tools that help you move around (mobility aids) if they are needed. These include: Canes. Walkers. Scooters. Crutches. Turn on the lights when you go into a dark area. Replace any light bulbs as soon as they burn out. Set up your furniture so you have a clear path. Avoid moving your furniture around. If any of your floors are uneven, fix them. If there are any pets around you, be aware of where they are. Review your medicines with your doctor. Some medicines can make you feel dizzy. This can increase your chance of falling. Ask your doctor what other things that you can do to help prevent falls. This information is not intended to replace advice given to you by your health care provider. Make sure you discuss any questions you have with your health care provider. Document Released: 11/20/2008 Document Revised: 07/02/2015 Document Reviewed: 02/28/2014 Elsevier Interactive Patient Education  2017 [employer].

## 2022-05-27 NOTE — Patient Outreach (Signed)
  Care Coordination   Follow Up Visit Note   05/27/2022 Name: Stacey Page MRN: [id number] DOB: [birth year]  Stacey Page is a 72 y.o. year old female who sees Mliss Sax, MD for primary care. I spoke with  Stacey Page by phone today.  What matters to the patients health and wellness today?  Stacey Page reported that she is doing well. She had a therapy session yesterday and found it helpful to talk to someone who is not related to her situation. It has been almost a year since she started using 2 liters of oxygen, and she is learning to cope with the heat. However, I advised her to be careful, stay cool, and wear a mask due to the pollen. She mentioned that dust and animal hair come out of the vents and affect her. She would like to see if there are any resources available to help her with cleaning out the vents in the home. Her vital signs today are as follows: weight 328 lbs., blood pressure 116/64, and blood sugar level 109 this morning. She denies chest pain or swelling.    Goals Addressed             This Visit's Progress    I want to monitor and manage my CHF       Patient Goals/Self Care Activities: -Patient/Caregiver will self-administer medications as prescribed as evidenced by self-report/primary caregiver report  -Patient/Caregiver will attend all scheduled provider appointments as evidenced by clinician review of documented attendance to scheduled appointments and patient/caregiver report -Patient/Caregiver will call pharmacy for medication refills as evidenced by patient report and review of pharmacy fill history as appropriate -Patient/Caregiver will call provider office for new concerns or questions as evidenced by review of documented incoming telephone call notes and patient report -Patient/Caregiver verbalizes understanding of plan -Patient/Caregiver will focus on medication adherence by taking medications as prescribed  -Weigh daily and record (notify MD  with 3 lb weight gain over night or 5 lb in a week) -Follow CHF Action Plan- do a check daily --Continue with light exercise, doing chair exercises and walking in the house -Be careful in the heat and wear a mask due to the pollen when you go out         SDOH assessments and interventions completed:  No     Care Coordination Interventions:  Yes, provided   Interventions Today    Flowsheet Row Most Recent Value  Chronic Disease   Chronic disease during today's visit Congestive Heart Failure (CHF)  General Interventions   General Interventions Discussed/Reviewed General Interventions Reviewed  Mental Health Interventions   Mental Health Discussed/Reviewed Mental Health Discussed  Safety Interventions   Safety Discussed/Reviewed Safety Discussed        Follow up plan: Follow up call scheduled for 06/28/22 11 am    Encounter Outcome:  Pt. Visit Completed   Juanell Fairly RN, BSN, [facility] Care Coordinator [facility]   Phone: [phone]

## 2022-05-31 ENCOUNTER — Telehealth: Payer: Self-pay | Admitting: *Deleted

## 2022-05-31 NOTE — Telephone Encounter (Signed)
   Telephone encounter was:  Successful.  05/31/2022 Name: Stacey Page MRN: [id number] DOB: 11-30-1950  [name] is a 72 y.o. year old female who is a primary care patient of Mliss Sax, MD . The community resource team was consulted for assistance with  home modifications and pet assistance . [place] [facility] referral as well as information mailed to patient   Care guide performed the following interventions: Patient provided with information about care guide support team and interviewed to confirm resource needs.  Follow Up Plan:  No further follow up planned at this time. The patient has been provided with needed resources.            Yehuda Mao Greenauer -[facility] [place], Population Health [phone] [address] [place] , [place] [address] Email : [email or web address]

## 2022-06-01 DIAGNOSIS — I739 Peripheral vascular disease, unspecified: Secondary | ICD-10-CM | POA: Diagnosis not present

## 2022-06-01 DIAGNOSIS — M2041 Other hammer toe(s) (acquired), right foot: Secondary | ICD-10-CM | POA: Diagnosis not present

## 2022-06-01 DIAGNOSIS — B351 Tinea unguium: Secondary | ICD-10-CM | POA: Diagnosis not present

## 2022-06-01 DIAGNOSIS — M792 Neuralgia and neuritis, unspecified: Secondary | ICD-10-CM | POA: Diagnosis not present

## 2022-06-01 DIAGNOSIS — E1142 Type 2 diabetes mellitus with diabetic polyneuropathy: Secondary | ICD-10-CM | POA: Diagnosis not present

## 2022-06-02 ENCOUNTER — Ambulatory Visit (INDEPENDENT_AMBULATORY_CARE_PROVIDER_SITE_OTHER): Payer: HMO | Admitting: Adult Health

## 2022-06-02 ENCOUNTER — Encounter (INDEPENDENT_AMBULATORY_CARE_PROVIDER_SITE_OTHER): Payer: Self-pay | Admitting: Adult Health

## 2022-06-02 VITALS — BP 102/62 | HR 55 | Temp 98.5°F | Ht 68.0 in | Wt 328.0 lb

## 2022-06-02 DIAGNOSIS — N184 Chronic kidney disease, stage 4 (severe): Secondary | ICD-10-CM

## 2022-06-02 DIAGNOSIS — Z6841 Body Mass Index (BMI) 40.0 and over, adult: Secondary | ICD-10-CM | POA: Diagnosis not present

## 2022-06-02 DIAGNOSIS — E669 Obesity, unspecified: Secondary | ICD-10-CM

## 2022-06-02 DIAGNOSIS — E1122 Type 2 diabetes mellitus with diabetic chronic kidney disease: Secondary | ICD-10-CM | POA: Diagnosis not present

## 2022-06-02 NOTE — Progress Notes (Signed)
WEIGHT SUMMARY AND BIOMETRICS  Vitals Temp: 98.5 F (36.9 C) BP: 102/62 Pulse Rate: (!) 55 SpO2: 95 %   Anthropometric Measurements Height:  (1.727 m) Weight: (!) 328 lb (148.8 kg) BMI (Calculated): 49.88 Weight at Last Visit: 327lb Weight Lost Since Last Visit: 0 Weight Gained Since Last Visit: 1lb Starting Weight: 344lb Total Weight Loss (lbs): 20 lb (9.072 kg)   Body Composition  Body Fat %: 54.5 % Fat Mass (lbs): 179.2 lbs Muscle Mass (lbs): 142 lbs Total Body Water (lbs): 104.4 lbs Visceral Fat Rating : 22   Other Clinical Data Fasting: no Labs: no Today's Visit #: 38 Starting Date: 03/25/19    Chief Complaint:   OBESITY Stacey Page is here to discuss her progress with her obesity treatment plan. She is on the the Category 2 Plan and states she is following her eating plan approximately 50 % of the time.  She states she is exercising Chair Exercise 10 minutes 3-4 times per week.   Interim History:  Podiatrist trimmed her Left great toenail yesterday and Ms. Chesterfield already reports decreased pain and improved mobility. She will be fitted for her new orthotic shoes next week, which will further improve her mobility.  She continues to require supplemental O2   She denies acute complaints at present.  Subjective:   1. Stage 4 chronic kidney disease 04/04/2022 Nephrology/ Dr. Vallery Sa- Reduced Marcelline Deist from  to  and started her on Losartan BP excellent at OV  2. Type 2 diabetes mellitus with stage 4 chronic kidney disease, without long-term current use of insulin Lab Results  Component Value Date   HGBA1C 6.5 04/21/2022   HGBA1C 6.3 (H) 11/12/2021   HGBA1C 6.0 (A) 07/14/2021   Dr. Laverna Peace recently assumed responsibility for prescribing daily Farxiga . Ms. Reffitt provided the last 4 days of fasting CBG: 133, 121, 95, 117 She denies sx's of hypogylcemia  Assessment/Plan:   1. Stage 4 chronic kidney disease Continue  to avoid Nephrotoxic substances Continue to keep BP well controlled.  2. Type 2 diabetes mellitus with stage 4 chronic kidney disease, without long-term current use of insulin Continue Cat 2 Meal Plan and remain as active as possible. Continue daily [place].  3. Obesity, current BMI 49.88  Odette is currently in the action stage of change. As such, her goal is to continue with weight loss efforts. She has agreed to the Category 2 Plan.   Exercise goals: Older adults should follow the adult guidelines. When older adults cannot meet the adult guidelines, they should be as physically active as their abilities and conditions will allow.   Behavioral modification strategies: increasing lean protein intake, decreasing simple carbohydrates, increasing vegetables, increasing water intake, meal planning and cooking strategies, and planning for success.  Zinia has agreed to follow-up with our clinic in 4 weeks. She was informed of the importance of frequent follow-up visits to maximize her success with intensive lifestyle modifications for her multiple health conditions.   Objective:   Blood pressure 102/62, pulse (!) 55, temperature 98.5 F (36.9 C), height  (1.727 m), weight (!) 328 lb (148.8 kg), SpO2 95 %. Body mass index is 49.87 kg/m.  General: Cooperative, alert, well developed, in no acute distress. HEENT: Conjunctivae and lids unremarkable. Cardiovascular: Regular rhythm.  Lungs: Normal work of breathing. Neurologic: No focal deficits.   Lab Results  Component Value Date   CREATININE 1.94 (H) 02/22/2022   BUN 26 (H) 02/22/2022   NA 139 02/22/2022  K 4.0 02/22/2022   CL 98 02/22/2022   CO2 31 02/22/2022   Lab Results  Component Value Date   ALT 9 01/25/2022   AST 17 01/25/2022   ALKPHOS 65 01/25/2022   BILITOT 0.4 01/25/2022   Lab Results  Component Value Date   HGBA1C 6.5 04/21/2022   HGBA1C 6.3 (H) 11/12/2021   HGBA1C 6.0 (A) 07/14/2021   HGBA1C 6.4 (A)  04/14/2021   HGBA1C 6.1 (H) 10/14/2020   Lab Results  Component Value Date   INSULIN 8.6 10/28/2019   INSULIN 9.2 07/04/2019   INSULIN 8.8 03/25/2019   Lab Results  Component Value Date   TSH 0.51 11/28/2018   Lab Results  Component Value Date   CHOL 144 01/14/2022   HDL 57.10 01/14/2022   LDLCALC 73 01/14/2022   TRIG 66.0 01/14/2022   CHOLHDL 3 01/14/2022   Lab Results  Component Value Date   VD25OH 57.6 10/28/2019   VD25OH 41.5 07/04/2019   VD25OH 38.2 03/25/2019   Lab Results  Component Value Date   WBC 9.5 02/01/2022   HGB 12.3 02/01/2022   HCT 38.3 02/01/2022   MCV 90.1 02/01/2022   PLT 287 02/01/2022   Lab Results  Component Value Date   IRON 29 01/25/2022   TIBC 260 01/25/2022   FERRITIN 51 01/25/2022   Attestation Statements:   Reviewed by clinician on day of visit: allergies, medications, problem list, medical history, surgical history, family history, social history, and previous encounter notes.  Time spent on visit including pre-visit chart review and post-visit care and charting was 28 minutes.   I have reviewed the above documentation for accuracy and completeness, and I agree with the above. -  Roland Prine d. Bulmaro Feagans, NP-C

## 2022-06-06 ENCOUNTER — Other Ambulatory Visit: Payer: Self-pay

## 2022-06-06 ENCOUNTER — Ambulatory Visit: Payer: No Typology Code available for payment source | Admitting: Podiatry

## 2022-06-09 ENCOUNTER — Telehealth: Payer: Self-pay | Admitting: *Deleted

## 2022-06-09 NOTE — Progress Notes (Signed)
  Care Coordination Note  06/09/2022 Name: Stacey Page MRN: [id number] DOB: 1950-12-11  Stacey Page is a 72 y.o. year old female who is a primary care patient of Mliss Sax, MD and is actively engaged with the care management team. I reached out to [name] by phone today to assist with re-scheduling a follow up visit with the RN Case Manager  Follow up plan: Unsuccessful telephone outreach attempt made. A HIPAA compliant phone message was left for the patient providing contact information and requesting a return call.   [facility]  Care Coordination Care Guide  Direct Dial: [phone]

## 2022-06-10 ENCOUNTER — Ambulatory Visit (INDEPENDENT_AMBULATORY_CARE_PROVIDER_SITE_OTHER): Payer: HMO | Admitting: Psychology

## 2022-06-10 DIAGNOSIS — F330 Major depressive disorder, recurrent, mild: Secondary | ICD-10-CM | POA: Diagnosis not present

## 2022-06-10 DIAGNOSIS — F411 Generalized anxiety disorder: Secondary | ICD-10-CM | POA: Diagnosis not present

## 2022-06-10 NOTE — Progress Notes (Signed)
[facility] Counselor/Therapist Progress Note  Patient ID: Stacey Page, MRN: [id number],    Date: 06/10/2022  Time Spent:45 minutes  Treatment Type: Individual Therapy  Reported Symptoms: decreased motivation, feeling a little anxious, sadness  Mental Status Exam: Appearance:  Casual     Behavior: Appropriate  Motor: Normal  Speech/Language:  Pressured  Affect: Blunt  Mood: anxious  Thought process: normal  Thought content:   WNL  Sensory/Perceptual disturbances:   WNL  Orientation: oriented to person, place, time/date, and situation  Attention: Good  Concentration: Good  Memory: WNL  Fund of knowledge:  Good  Insight:   Good  Judgment:  Good  Impulse Control: Good   Risk Assessment: Danger to Self:  No Self-injurious Behavior: No Danger to Others: No Duty to Warn:no Physical Aggression / Violence:No  Access to Firearms a concern: No  Gang Involvement:No   Subjective: The patient attended an individual therapy session in the office today.  The patient presents with a blunted affect and mood is anxious.  The patient reports that her nephew was fine after the last session and she talked some more about frustrations that she has with different family members.  The patient seems to process things pretty well but I think she needs an opportunity to vent about her situation.  The patient talked about being frustrated with her grandson and her daughter.  The patient talked about how they think differently than she does and I think this causes her some angst because it does not seem that they listened to her wisdom.  I validated her during this session and provided supportive therapy.   Interventions: Cognitive Behavioral Therapy, Assertiveness/Communication, and Insight-Oriented  Diagnosis:Major depressive disorder, recurrent episode, mild (HCC)  Generalized anxiety disorder  Plan: " I need someone to talk to" Supportive family,  intelligent Objective Learn and implement problem-solving strategies for realistically addressing worries. Target Date: 04/21/2023 Frequency: Biweekly Progress: 0 Modality: individual Related Interventions 1. Teach the client problem-solving strategies involving specifically defining a problem,  generating options for addressing it, evaluating the pros and cons of each option, selecting and  implementing an optional action, and reevaluating and refining the action (or assign "Applying  Problem-Solving to Interpersonal Conflict" in the Adult Psychotherapy Homework Planner by  Stephannie Li). Objective Describe situations, thoughts, feelings, and actions associated with anxieties and worries, their impact  on functioning, and attempts to resolve them. Target Date: 04/21/2023 Frequency: Biweekly Progress: 0 Modality: individual Diagnosis  300.02 (Generalized anxiety disorder)  F33.0 (Major depressive affective disorder, recurrent episode, mild Conditions For Discharge Achievement of treatment goals and objectives   Santonio Speakman G Vincenta Steffey, LCSW

## 2022-06-11 DIAGNOSIS — J45909 Unspecified asthma, uncomplicated: Secondary | ICD-10-CM | POA: Diagnosis not present

## 2022-06-11 DIAGNOSIS — J9611 Chronic respiratory failure with hypoxia: Secondary | ICD-10-CM | POA: Diagnosis not present

## 2022-06-11 DIAGNOSIS — J9602 Acute respiratory failure with hypercapnia: Secondary | ICD-10-CM | POA: Diagnosis not present

## 2022-06-13 ENCOUNTER — Other Ambulatory Visit: Payer: Self-pay

## 2022-06-13 ENCOUNTER — Other Ambulatory Visit (HOSPITAL_COMMUNITY): Payer: Self-pay

## 2022-06-13 NOTE — Progress Notes (Signed)
  Care Coordination Note  06/13/2022 Name: Stacey Page MRN: [id number] DOB: [birth year]  Stacey Page is a 72 y.o. year old female who is a primary care patient of Mliss Sax, MD and is actively engaged with the care management team. I reached out to [name] by phone today to assist with re-scheduling a follow up visit with the RN Case Manager  Follow up plan: Telephone appointment with care management team member scheduled for:07/05/22  [facility] Coordination Care Guide  Direct Dial: [phone]

## 2022-06-14 DIAGNOSIS — E1142 Type 2 diabetes mellitus with diabetic polyneuropathy: Secondary | ICD-10-CM | POA: Diagnosis not present

## 2022-06-14 DIAGNOSIS — B351 Tinea unguium: Secondary | ICD-10-CM | POA: Diagnosis not present

## 2022-06-14 DIAGNOSIS — M2041 Other hammer toe(s) (acquired), right foot: Secondary | ICD-10-CM | POA: Diagnosis not present

## 2022-06-14 DIAGNOSIS — I739 Peripheral vascular disease, unspecified: Secondary | ICD-10-CM | POA: Diagnosis not present

## 2022-06-14 DIAGNOSIS — M792 Neuralgia and neuritis, unspecified: Secondary | ICD-10-CM | POA: Diagnosis not present

## 2022-06-16 ENCOUNTER — Other Ambulatory Visit (HOSPITAL_COMMUNITY): Payer: Self-pay

## 2022-06-16 ENCOUNTER — Telehealth: Payer: Self-pay | Admitting: Family Medicine

## 2022-06-16 ENCOUNTER — Telehealth: Payer: Self-pay | Admitting: Cardiology

## 2022-06-16 DIAGNOSIS — J4521 Mild intermittent asthma with (acute) exacerbation: Secondary | ICD-10-CM

## 2022-06-16 DIAGNOSIS — E1169 Type 2 diabetes mellitus with other specified complication: Secondary | ICD-10-CM

## 2022-06-16 MED ORDER — HYDRALAZINE HCL 50 MG PO TABS
50.0000 mg | ORAL_TABLET | Freq: Three times a day (TID) | ORAL | 3 refills | Status: DC
  Filled 2022-06-16 – 2022-07-12 (×3): qty 90, 30d supply, fill #0
  Filled 2022-08-14: qty 90, 30d supply, fill #1
  Filled 2022-09-11: qty 90, 30d supply, fill #2
  Filled 2022-10-16: qty 90, 30d supply, fill #3

## 2022-06-16 NOTE — Telephone Encounter (Signed)
Caller Name: Afshan Call back phone #: [phone]   MEDICATION(S):  albuterol (VENTOLIN HFA) 108 (90 Base) MCG/ACT inhaler - inhaler empty - uses as rescue  Verio1 Flex test strips - a few days left  Has the patient contacted their pharmacy (YES/NO)? Yes - new RX needed What did pharmacy advise?   Preferred Pharmacy:  [facility]  ~~~Please advise patient/caregiver to allow 2-3 business days to process RX refills.

## 2022-06-16 NOTE — Telephone Encounter (Signed)
*  STAT* If patient is at the pharmacy, call can be transferred to refill team.   1. Which medications need to be refilled? (please list name of each medication and dose if known)  hydrALAZINE (APRESOLINE) 50 MG tablet   2. Which pharmacy/location (including street and city if local pharmacy) is medication to be sent to? [place] [facility]   3. Do they need a 30 day or 90 day supply? 90 day  Patient will be out by the weekend.

## 2022-06-16 NOTE — Telephone Encounter (Signed)
Hydralazine 50 mg was sent to [facility]. Spoke with pt and pt is aware that RX was sent in.

## 2022-06-23 ENCOUNTER — Ambulatory Visit (INDEPENDENT_AMBULATORY_CARE_PROVIDER_SITE_OTHER): Payer: HMO | Admitting: Psychology

## 2022-06-23 DIAGNOSIS — I129 Hypertensive chronic kidney disease with stage 1 through stage 4 chronic kidney disease, or unspecified chronic kidney disease: Secondary | ICD-10-CM | POA: Diagnosis not present

## 2022-06-23 DIAGNOSIS — F411 Generalized anxiety disorder: Secondary | ICD-10-CM

## 2022-06-23 DIAGNOSIS — N184 Chronic kidney disease, stage 4 (severe): Secondary | ICD-10-CM | POA: Diagnosis not present

## 2022-06-23 DIAGNOSIS — E1122 Type 2 diabetes mellitus with diabetic chronic kidney disease: Secondary | ICD-10-CM | POA: Diagnosis not present

## 2022-06-23 DIAGNOSIS — F330 Major depressive disorder, recurrent, mild: Secondary | ICD-10-CM

## 2022-06-23 DIAGNOSIS — D631 Anemia in chronic kidney disease: Secondary | ICD-10-CM | POA: Diagnosis not present

## 2022-06-23 DIAGNOSIS — I5032 Chronic diastolic (congestive) heart failure: Secondary | ICD-10-CM | POA: Diagnosis not present

## 2022-06-23 DIAGNOSIS — N2581 Secondary hyperparathyroidism of renal origin: Secondary | ICD-10-CM | POA: Diagnosis not present

## 2022-06-23 NOTE — Progress Notes (Signed)
[facility] Counselor/Therapist Progress Note  Patient ID: Tempy Alcindor, MRN: [id number],    Date: 06/23/2022  Time Spent:55 minutes  Treatment Type: Individual Therapy  Reported Symptoms: decreased motivation, feeling a little anxious, sadness  Mental Status Exam: Appearance:  Casual     Behavior: Appropriate  Motor: Normal  Speech/Language:  Pressured  Affect: Blunt  Mood: pleasant  Thought process: normal  Thought content:   WNL  Sensory/Perceptual disturbances:   WNL  Orientation: oriented to person, place, time/date, and situation  Attention: Good  Concentration: Good  Memory: WNL  Fund of knowledge:  Good  Insight:   Good  Judgment:  Good  Impulse Control: Good   Risk Assessment: Danger to Self:  No Self-injurious Behavior: No Danger to Others: No Duty to Warn:no Physical Aggression / Violence:No  Access to Firearms a concern: No  Gang Involvement:No   Subjective: The patient attended an individual therapy session in the office today.  The patient presents with a blunted affect and mood is pleasant.  Today the patient talked extensively about her concerns over her grandson who is 2 years old.  She reports that he is very very bright and struggles at times with dealing with his parents being controlling.  The patient talked also about them having difficulties in their family relationships and it is just been recently that she has been able to spend more time with her grandchild.  Provided support and encouragement and some validation for her as a grandmother.  Interventions: Cognitive Behavioral Therapy, Assertiveness/Communication, and Insight-Oriented  Diagnosis:Major depressive disorder, recurrent episode, mild (HCC)  Generalized anxiety disorder  Plan: " I need someone to talk to" Supportive family, intelligent Objective Learn and implement problem-solving strategies for realistically addressing worries. Target Date: 04/21/2023  Frequency: Biweekly Progress: 0 Modality: individual Related Interventions 1. Teach the client problem-solving strategies involving specifically defining a problem,  generating options for addressing it, evaluating the pros and cons of each option, selecting and  implementing an optional action, and reevaluating and refining the action (or assign "Applying  Problem-Solving to Interpersonal Conflict" in the Adult Psychotherapy Homework Planner by  Stephannie Li). Objective Describe situations, thoughts, feelings, and actions associated with anxieties and worries, their impact  on functioning, and attempts to resolve them. Target Date: 04/21/2023 Frequency: Biweekly Progress: 0 Modality: individual Diagnosis  300.02 (Generalized anxiety disorder)  F33.0 (Major depressive affective disorder, recurrent episode, mild Conditions For Discharge Achievement of treatment goals and objectives   Doranne Schmutz G Veda Arrellano, LCSW

## 2022-06-24 ENCOUNTER — Other Ambulatory Visit: Payer: Self-pay

## 2022-06-24 LAB — LAB REPORT - SCANNED
Creatinine, POC: 229.9 mg/dL
EGFR: 21

## 2022-06-24 MED ORDER — ONETOUCH VERIO VI STRP
ORAL_STRIP | 3 refills | Status: AC
Start: 2022-06-24 — End: ?
  Filled 2022-06-24: qty 200, 90d supply, fill #0
  Filled 2022-12-11: qty 200, 90d supply, fill #1
  Filled 2023-06-17: qty 200, 90d supply, fill #2

## 2022-06-24 MED ORDER — ALBUTEROL SULFATE HFA 108 (90 BASE) MCG/ACT IN AERS
INHALATION_SPRAY | RESPIRATORY_TRACT | 3 refills | Status: AC
Start: 2022-06-24 — End: ?
  Filled 2022-06-24: qty 6.7, 25d supply, fill #0
  Filled 2023-04-23: qty 6.7, 25d supply, fill #1
  Filled 2023-05-13: qty 6.7, 25d supply, fill #2

## 2022-06-24 NOTE — Telephone Encounter (Signed)
Stacey Page [phone]    Pt has not received this refill yet. albuterol (VENTOLIN HFA) 108 (90 Base) MCG/ACT inhaler [[id number]] She is totally out and the pharmacy said it needs to be called in.    She also needs glucose blood (ONETOUCH VERIO) test strip [[id number]]

## 2022-06-25 ENCOUNTER — Other Ambulatory Visit (HOSPITAL_COMMUNITY): Payer: Self-pay

## 2022-06-27 ENCOUNTER — Other Ambulatory Visit: Payer: Self-pay

## 2022-06-27 ENCOUNTER — Other Ambulatory Visit (HOSPITAL_COMMUNITY): Payer: Self-pay

## 2022-06-27 MED ORDER — LOSARTAN POTASSIUM 25 MG PO TABS
12.5000 mg | ORAL_TABLET | Freq: Every day | ORAL | 3 refills | Status: DC
  Filled 2022-06-27 – 2022-12-17 (×2): qty 45, 90d supply, fill #0
  Filled 2023-02-18 – 2023-03-04 (×2): qty 45, 90d supply, fill #1
  Filled 2023-04-02 – 2023-05-13 (×4): qty 45, 90d supply, fill #2

## 2022-06-29 ENCOUNTER — Encounter: Payer: Self-pay | Admitting: Nephrology

## 2022-06-29 ENCOUNTER — Other Ambulatory Visit (INDEPENDENT_AMBULATORY_CARE_PROVIDER_SITE_OTHER): Payer: HMO | Admitting: Podiatry

## 2022-06-29 DIAGNOSIS — E1142 Type 2 diabetes mellitus with diabetic polyneuropathy: Secondary | ICD-10-CM

## 2022-06-29 DIAGNOSIS — M2141 Flat foot [pes planus] (acquired), right foot: Secondary | ICD-10-CM

## 2022-06-29 DIAGNOSIS — M2142 Flat foot [pes planus] (acquired), left foot: Secondary | ICD-10-CM

## 2022-06-29 NOTE — Progress Notes (Signed)
1. Diabetic peripheral neuropathy associated with type 2 diabetes mellitus (HCC)   2. Pes planus of both feet    Orders Placed This Encounter  Procedures   For Home Use Only DME Diabetic Shoe    To [employer]: (faxed by Mrs. Rodena Medin) Office Number: [phone] Fax: [phone]  Dispense one pair extra depth shoes with 3 pair total contact insoles.

## 2022-06-30 ENCOUNTER — Encounter (INDEPENDENT_AMBULATORY_CARE_PROVIDER_SITE_OTHER): Payer: Self-pay | Admitting: Adult Health

## 2022-06-30 ENCOUNTER — Ambulatory Visit (INDEPENDENT_AMBULATORY_CARE_PROVIDER_SITE_OTHER): Payer: HMO | Admitting: Adult Health

## 2022-06-30 VITALS — BP 146/68 | HR 61 | Temp 97.7°F | Ht 68.0 in | Wt 329.0 lb

## 2022-06-30 DIAGNOSIS — E669 Obesity, unspecified: Secondary | ICD-10-CM

## 2022-06-30 DIAGNOSIS — N184 Chronic kidney disease, stage 4 (severe): Secondary | ICD-10-CM | POA: Diagnosis not present

## 2022-06-30 DIAGNOSIS — Z6841 Body Mass Index (BMI) 40.0 and over, adult: Secondary | ICD-10-CM | POA: Diagnosis not present

## 2022-06-30 DIAGNOSIS — E1169 Type 2 diabetes mellitus with other specified complication: Secondary | ICD-10-CM

## 2022-06-30 DIAGNOSIS — I152 Hypertension secondary to endocrine disorders: Secondary | ICD-10-CM | POA: Diagnosis not present

## 2022-06-30 DIAGNOSIS — E1159 Type 2 diabetes mellitus with other circulatory complications: Secondary | ICD-10-CM | POA: Diagnosis not present

## 2022-06-30 NOTE — Progress Notes (Signed)
WEIGHT SUMMARY AND BIOMETRICS  Vitals Temp: 97.7 F (36.5 C) BP: (!) 146/68 Pulse Rate: 61 SpO2: 92 %   Anthropometric Measurements Height: 5\' 8"  (1.727 m) Weight: (!) 329 lb (149.2 kg) BMI (Calculated): 50.04 Weight at Last Visit: 328 lb Weight Lost Since Last Visit: 0 Weight Gained Since Last Visit: 1 lb Starting Weight: 344 lb   Body Composition  Body Fat %: 55.5 % Fat Mass (lbs): 183 lbs Muscle Mass (lbs): 139.2 lbs Total Body Water (lbs): 106.6 lbs Visceral Fat Rating : 23   Other Clinical Data Fasting: No Labs: No Today's Visit #: 48 Starting Date: 03/25/19    Chief Complaint:   OBESITY Stacey Page is here to discuss her progress with her obesity treatment plan. She is on the the Category 2 Plan and states she is following her eating plan approximately 50 % of the time. She states she is exercising Chair Exercises/ Walking 10 minutes 7 times per week.   Interim History:  Stacey Page has been increasing activity as tolerated. She is followed closely by PCP, Cards, BH, and Nephrology  Cravings- she endorses cravings for "cold things" ie: watermelon and red grapes.  Sleep- nightly CPAP with O2 - 2LNC  Hydration-she estimates to drink 64 oz plain water/day  Subjective:   1. Type 2 diabetes mellitus with other specified complication, without long-term current use of insulin (HCC) Lab Results  Component Value Date   HGBA1C 6.5 04/21/2022   HGBA1C 6.3 (H) 11/12/2021   HGBA1C 6.0 (A) 07/14/2021   Farxiga 5mg  QD- managed by PCP/Dr. Doreene Burke Her fasting CBG 110-120s  2. Stage 4 chronic kidney disease (HCC)  06/24/22 16:44  eGFR 21.0 (E)  (E): External lab result FOllowed by Nehprology Q3M She denies known family hx of Renal Disease  3. Hypertension associated with diabetes (HCC) Home readings in last week: 125/60, 127/52, 119/60, 108/54, 112/52, 130/64 Always taken in L wrist She denies tobacco/vape use She is portable O2 dependent Nephrology  recently decreased Losartan 25mg  to 1/2 t ab QD No change to any other antihypertensive   Assessment/Plan:   1. Type 2 diabetes mellitus with other specified complication, without long-term current use of insulin (HCC) Continue Farxiga 5mg  QD  2. Stage 4 chronic kidney disease [facility]) Nephrology f/u Q3M Void Nephrotoxic substances  3. Hypertension associated with diabetes (HCC) Continue   aspirin 81 MG tablet    Take 1 tablet (81 mg total) by mouth daily with breakfast    hydrALAZINE (APRESOLINE) 50 MG tablet    Take 1 tablet (50 mg total) by mouth every 8 (eight) hours    atorvastatin (LIPITOR) 20 MG tablet    Take 1 tablet (20 mg total) by mouth daily            isosorbide dinitrate (ISORDIL) 10 MG tablet    Take 1 tablet (10 mg total) by mouth 3 (three) times daily. Please keep scheduled appointment    carvedilol (COREG) 25 MG tablet    Take 1 tablet (25 mg total) by mouth 2 (two) times daily    furosemide (LASIX) 80 MG tablet    Take 1 tablet (80 mg total) by mouth daily    hydrALAZINE (APRESOLINE) 50 MG tablet    Take 1 tablet (50 mg total) by mouth every 8 (eight) hours    losartan (COZAAR) 25 MG tablet    Take 0.5 tablets (12.5 mg total) by mouth daily   4. Obesity, current BMI 50.1  Stacey Page is currently  in the action stage of change. As such, her goal is to continue with weight loss efforts. She has agreed to the Category 2 Plan.   Exercise goals: Older adults should follow the adult guidelines. When older adults cannot meet the adult guidelines, they should be as physically active as their abilities and conditions will allow.   Behavioral modification strategies: increasing lean protein intake, decreasing simple carbohydrates, increasing vegetables, increasing water intake, meal planning and cooking strategies, and planning for success.  Stacey Page has agreed to follow-up with our clinic in 4 weeks. She was informed of the importance of frequent follow-up  visits to maximize her success with intensive lifestyle modifications for her multiple health conditions.   Objective:   Blood pressure (!) 146/68, pulse 61, temperature 97.7 F (36.5 C), height 5\' 8"  (1.727 m), weight (!) 329 lb (149.2 kg), SpO2 92 %. Body mass index is 50.02 kg/m.  General: Cooperative, alert, well developed, in no acute distress. HEENT: Conjunctivae and lids unremarkable. Cardiovascular: Regular rhythm.  Lungs: Normal work of breathing. Neurologic: No focal deficits.   Lab Results  Component Value Date   CREATININE 1.94 (H) 02/22/2022   BUN 26 (H) 02/22/2022   NA 139 02/22/2022   K 4.0 02/22/2022   CL 98 02/22/2022   CO2 31 02/22/2022   Lab Results  Component Value Date   ALT 9 01/25/2022   AST 17 01/25/2022   ALKPHOS 65 01/25/2022   BILITOT 0.4 01/25/2022   Lab Results  Component Value Date   HGBA1C 6.5 04/21/2022   HGBA1C 6.3 (H) 11/12/2021   HGBA1C 6.0 (A) 07/14/2021   HGBA1C 6.4 (A) 04/14/2021   HGBA1C 6.1 (H) 10/14/2020   Lab Results  Component Value Date   INSULIN 8.6 10/28/2019   INSULIN 9.2 07/04/2019   INSULIN 8.8 03/25/2019   Lab Results  Component Value Date   TSH 0.51 11/28/2018   Lab Results  Component Value Date   CHOL 144 01/14/2022   HDL 57.10 01/14/2022   LDLCALC 73 01/14/2022   TRIG 66.0 01/14/2022   CHOLHDL 3 01/14/2022   Lab Results  Component Value Date   VD25OH 57.6 10/28/2019   VD25OH 41.5 07/04/2019   VD25OH 38.2 03/25/2019   Lab Results  Component Value Date   WBC 9.5 02/01/2022   HGB 12.3 02/01/2022   HCT 38.3 02/01/2022   MCV 90.1 02/01/2022   PLT 287 02/01/2022   Lab Results  Component Value Date   IRON 29 01/25/2022   TIBC 260 01/25/2022   FERRITIN 51 01/25/2022   Attestation Statements:   Reviewed by clinician on day of visit: allergies, medications, problem list, medical history, surgical history, family history, social history, and previous encounter notes.  Time spent on visit  including pre-visit chart review and post-visit care and charting was 29 minutes.   I have reviewed the above documentation for accuracy and completeness, and I agree with the above. -  Cieara Stierwalt d. Spruha Weight, NP-C

## 2022-07-05 ENCOUNTER — Ambulatory Visit: Payer: Self-pay

## 2022-07-05 NOTE — Patient Outreach (Unsigned)
  Care Coordination   Follow Up Visit Note   07/06/2022 Name: Lanaisha Raffa MRN: [id number] DOB: 1950-04-27  Shaquel Peardon is a 72 y.o. year old female who sees Mliss Sax, MD for primary care. I engaged with [name] in the providers office today.  What matters to the patients health and wellness today?  Mr. Pacyna is feeling good. She received her diabetic shoes and had the ingrown toenail fixed. She is struggling with her weight but has reduced from 344 lbs to 329 lbs. I congratulated her on her efforts. We discussed her nutrition and exercise. She said that health-wise, she is doing better, with no swelling or shortness of breath. Her blood sugars range between 117 and 127, and her blood pressure is 108/54 to 130/62. She is taking her medications as prescribed.    Goals Addressed             This Visit's Progress    I want to monitor and manage my CHF       Patient Goals/Self Care Activities: -Patient/Caregiver will self-administer medications as prescribed as evidenced by self-report/primary caregiver report  -Patient/Caregiver will attend all scheduled provider appointments as evidenced by clinician review of documented attendance to scheduled appointments and patient/caregiver report -Patient/Caregiver will call pharmacy for medication refills as evidenced by patient report and review of pharmacy fill history as appropriate -Patient/Caregiver will call provider office for new concerns or questions as evidenced by review of documented incoming telephone call notes and patient report -Patient/Caregiver verbalizes understanding of plan -Patient/Caregiver will focus on medication adherence by taking medications as prescribed  -Weigh daily and record (notify MD with 3 lb weight gain over night or 5 lb in a week) -Follow CHF Action Plan- do a check daily -Continue to monitor food and fluid intake -Plan your meals eat six small meals a day so you will not miss  meal  or get very [place]        SDOH assessments and interventions completed:  No     Care Coordination Interventions:  Yes, provided   Interventions Today    Flowsheet Row Most Recent Value  Chronic Disease   Chronic disease during today's visit Congestive Heart Failure (CHF)  General Interventions   General Interventions Discussed/Reviewed General Interventions Discussed, General Interventions Reviewed, Community Resources  Exercise Interventions   Exercise Discussed/Reviewed Physical Activity  Physical Activity Discussed/Reviewed Physical Activity Reviewed, Home Exercise Program (HEP)  Education Interventions   Education Provided Provided Education  Provided Verbal Education On Nutrition  Nutrition Interventions   Nutrition Discussed/Reviewed Nutrition Discussed, Increasing proteins, Portion sizes  Safety Interventions   Safety Discussed/Reviewed Safety Discussed     .  Follow up plan: Follow up call scheduled for 08/05/22  1030 am    Encounter Outcome:  Pt. Visit Completed   Juanell Fairly RN, BSN, [facility] Care Coordinator [facility]   Phone: [phone]

## 2022-07-06 NOTE — Patient Instructions (Signed)
Visit Information  Thank you for taking time to visit with me today. Please don't hesitate to contact me if I can be of assistance to you.   Following are the goals we discussed today:   Goals Addressed             This Visit's Progress    I want to monitor and manage my CHF       Patient Goals/Self Care Activities: -Patient/Caregiver will self-administer medications as prescribed as evidenced by self-report/primary caregiver report  -Patient/Caregiver will attend all scheduled provider appointments as evidenced by clinician review of documented attendance to scheduled appointments and patient/caregiver report -Patient/Caregiver will call pharmacy for medication refills as evidenced by patient report and review of pharmacy fill history as appropriate -Patient/Caregiver will call provider office for new concerns or questions as evidenced by review of documented incoming telephone call notes and patient report -Patient/Caregiver verbalizes understanding of plan -Patient/Caregiver will focus on medication adherence by taking medications as prescribed  -Weigh daily and record (notify MD with 3 lb weight gain over night or 5 lb in a week) -Follow CHF Action Plan- do a check daily -Continue to monitor food and fluid intake -Plan your meals eat six small meals a day so you will not miss meal  or get very [place]        Our next appointment is by telephone on 08/05/22 at 1030 am  Please call the care guide team at [phone] if you need to cancel or reschedule your appointment.   If you are experiencing a Mental Health or Behavioral Health Crisis or need someone to talk to, please call 1-800-273-TALK (toll free, 24 hour hotline)  Patient verbalizes understanding of instructions and care plan provided today and agrees to view in MyChart. Active MyChart status and patient understanding of how to access instructions and care plan via MyChart confirmed with patient.     Juanell Fairly RN, BSN,  [facility] Care Coordinator [facility]   Phone: [phone]

## 2022-07-07 ENCOUNTER — Ambulatory Visit (INDEPENDENT_AMBULATORY_CARE_PROVIDER_SITE_OTHER): Payer: HMO | Admitting: Psychology

## 2022-07-07 DIAGNOSIS — F330 Major depressive disorder, recurrent, mild: Secondary | ICD-10-CM | POA: Diagnosis not present

## 2022-07-07 DIAGNOSIS — F411 Generalized anxiety disorder: Secondary | ICD-10-CM | POA: Diagnosis not present

## 2022-07-08 NOTE — Progress Notes (Signed)
     [facility] Counselor/Therapist Progress Note  Patient ID: Stacey Page, MRN: [id number],    Date: 07/07/2022  Time Spent:55 minutes  Treatment Type: Individual Therapy  Reported Symptoms: decreased motivation, feeling a little anxious, sadness  Mental Status Exam: Appearance:  Casual     Behavior: Appropriate  Motor: Normal  Speech/Language:  Pressured  Affect: Blunt  Mood: pleasant  Thought process: normal  Thought content:   WNL  Sensory/Perceptual disturbances:   WNL  Orientation: oriented to person, place, time/date, and situation  Attention: Good  Concentration: Good  Memory: WNL  Fund of knowledge:  Good  Insight:   Good  Judgment:  Good  Impulse Control: Good   Risk Assessment: Danger to Self:  No Self-injurious Behavior: No Danger to Others: No Duty to Warn:no Physical Aggression / Violence:No  Access to Firearms a concern: No  Gang Involvement:No   Subjective: The patient attended an individual therapy session in the office today.  The patient presents with a blunted affect and mood is pleasant.  The patient talked today about family dynamics that she is having to deal with.  The patient reports that she has to deal with her children and her siblings and it seems that she does a relatively good job of trying to interact with them even though they seem to have some selfish tendencies.  The patient does seem to be able to set limits and boundaries around what she needs.  The patient also talked somewhat about health issues she has.  Provided supportive therapy and did some reframing using cognitive behavioral therapy during this session about how she could look at things differently.  Interventions: Cognitive Behavioral Therapy, Assertiveness/Communication, and Insight-Oriented  Diagnosis:Major depressive disorder, recurrent episode, mild (HCC)  Generalized anxiety disorder  Plan: " I need someone to talk to" Supportive family,  intelligent Objective Learn and implement problem-solving strategies for realistically addressing worries. Target Date: 04/21/2023 Frequency: Biweekly Progress: 0 Modality: individual Related Interventions 1. Teach the client problem-solving strategies involving specifically defining a problem,  generating options for addressing it, evaluating the pros and cons of each option, selecting and  implementing an optional action, and reevaluating and refining the action (or assign "Applying  Problem-Solving to Interpersonal Conflict" in the Adult Psychotherapy Homework Planner by  Stephannie Li). Objective Describe situations, thoughts, feelings, and actions associated with anxieties and worries, their impact  on functioning, and attempts to resolve them. Target Date: 04/21/2023 Frequency: Biweekly Progress: 0 Modality: individual Diagnosis  300.02 (Generalized anxiety disorder)  F33.0 (Major depressive affective disorder, recurrent episode, mild Conditions For Discharge Achievement of treatment goals and objectives   Aryon Nham G Melrose Kearse, LCSW

## 2022-07-12 ENCOUNTER — Telehealth: Payer: Self-pay | Admitting: Family Medicine

## 2022-07-12 ENCOUNTER — Other Ambulatory Visit (HOSPITAL_BASED_OUTPATIENT_CLINIC_OR_DEPARTMENT_OTHER): Payer: Self-pay

## 2022-07-12 ENCOUNTER — Other Ambulatory Visit: Payer: Self-pay | Admitting: Nurse Practitioner

## 2022-07-12 ENCOUNTER — Other Ambulatory Visit (HOSPITAL_COMMUNITY): Payer: Self-pay

## 2022-07-12 DIAGNOSIS — J9611 Chronic respiratory failure with hypoxia: Secondary | ICD-10-CM | POA: Diagnosis not present

## 2022-07-12 DIAGNOSIS — J45909 Unspecified asthma, uncomplicated: Secondary | ICD-10-CM | POA: Diagnosis not present

## 2022-07-12 DIAGNOSIS — J9602 Acute respiratory failure with hypercapnia: Secondary | ICD-10-CM | POA: Diagnosis not present

## 2022-07-12 DIAGNOSIS — E876 Hypokalemia: Secondary | ICD-10-CM

## 2022-07-12 MED ORDER — POTASSIUM CHLORIDE CRYS ER 10 MEQ PO TBCR
10.0000 meq | EXTENDED_RELEASE_TABLET | Freq: Every day | ORAL | 2 refills | Status: DC
Start: 2022-07-12 — End: 2023-01-29
  Filled 2022-07-12: qty 60, 60d supply, fill #0
  Filled 2022-07-24: qty 90, 90d supply, fill #0
  Filled 2022-10-30: qty 90, 90d supply, fill #1

## 2022-07-12 NOTE — Telephone Encounter (Signed)
Pt stated that the pharmacy told her that her meds are old and needs to be updated. I asee there are 2 of most. They are sending refills to [place]. She is requesting that her med list be updated and to refill the gabapentin (NEURONTIN) 100 MG capsule [[id number]] she will be out Friday.

## 2022-07-12 NOTE — Telephone Encounter (Signed)
All of patient prescriptions were filled in February or March all with 90 day and 2-3 refills all except for Potassium which was sent in today. Called patient to see which medications it was that's running low and to inform that she may need to call pharmacy to fill the refills. Mo answer LMTCB

## 2022-07-13 ENCOUNTER — Other Ambulatory Visit: Payer: Self-pay

## 2022-07-13 ENCOUNTER — Other Ambulatory Visit (HOSPITAL_COMMUNITY): Payer: Self-pay

## 2022-07-13 MED ORDER — GABAPENTIN 100 MG PO CAPS
100.0000 mg | ORAL_CAPSULE | Freq: Two times a day (BID) | ORAL | 1 refills | Status: DC
  Filled 2022-07-13: qty 180, 90d supply, fill #0
  Filled 2022-10-16: qty 180, 90d supply, fill #1

## 2022-07-14 ENCOUNTER — Encounter: Payer: Self-pay | Admitting: Family Medicine

## 2022-07-14 ENCOUNTER — Other Ambulatory Visit: Payer: Self-pay

## 2022-07-14 ENCOUNTER — Other Ambulatory Visit (HOSPITAL_COMMUNITY): Payer: Self-pay

## 2022-07-14 ENCOUNTER — Ambulatory Visit (INDEPENDENT_AMBULATORY_CARE_PROVIDER_SITE_OTHER): Payer: HMO | Admitting: Family Medicine

## 2022-07-14 VITALS — BP 124/68 | HR 59 | Temp 98.1°F | Ht 68.0 in | Wt 332.4 lb

## 2022-07-14 DIAGNOSIS — Z7984 Long term (current) use of oral hypoglycemic drugs: Secondary | ICD-10-CM

## 2022-07-14 DIAGNOSIS — Z6841 Body Mass Index (BMI) 40.0 and over, adult: Secondary | ICD-10-CM | POA: Diagnosis not present

## 2022-07-14 DIAGNOSIS — N184 Chronic kidney disease, stage 4 (severe): Secondary | ICD-10-CM | POA: Diagnosis not present

## 2022-07-14 DIAGNOSIS — E1169 Type 2 diabetes mellitus with other specified complication: Secondary | ICD-10-CM | POA: Diagnosis not present

## 2022-07-14 LAB — BASIC METABOLIC PANEL
BUN: 26 mg/dL — ABNORMAL HIGH (ref 6–23)
CO2: 33 mEq/L — ABNORMAL HIGH (ref 19–32)
Calcium: 9.4 mg/dL (ref 8.4–10.5)
Chloride: 100 mEq/L (ref 96–112)
Creatinine, Ser: 2.09 mg/dL — ABNORMAL HIGH (ref 0.40–1.20)
GFR: 23.55 mL/min — ABNORMAL LOW (ref >60.00)
Glucose, Bld: 93 mg/dL (ref 70–99)
Potassium: 4.2 mEq/L (ref 3.5–5.1)
Sodium: 141 mEq/L (ref 135–145)

## 2022-07-14 LAB — HEMOGLOBIN A1C: Hgb A1c MFr Bld: 6.5 % (ref 4.6–6.5)

## 2022-07-14 MED ORDER — ACCU-CHEK SOFTCLIX LANCETS MISC
12 refills | Status: DC
  Filled 2022-07-14: qty 100, 33d supply, fill #0
  Filled 2023-06-17: qty 100, 34d supply, fill #0

## 2022-07-14 NOTE — Progress Notes (Signed)
Established Patient Office Visit   Subjective:  Patient ID: Stacey Page, female    DOB: 05-05-1950  Age: 72 y.o. MRN: [id number]  Chief Complaint  Patient presents with   Medical Management of Chronic Issues    6 month follow up, no concerns. Patient not fasting.     HPI Encounter Diagnoses  Name Primary?   Type 2 diabetes mellitus with other specified complication, without long-term current use of insulin (HCC) Yes   Stage 4 chronic kidney disease (HCC)    Class 3 severe obesity with serious comorbidity and body mass index (BMI) of 50.0 to 59.9 in adult, unspecified obesity type (HCC)    For follow-up of above.  Currently working with weight loss management and has been able to lose a few pounds.  Continues medications below.  Diabetes has been well-controlled.  Fasting sugars have been in the less than 130 range.  CKD has been stable.  Blood pressures typically run in the 120s to 130s over 60s to 70s.   Review of Systems  Constitutional: Negative.   HENT: Negative.    Eyes:  Negative for blurred vision, discharge and redness.  Respiratory: Negative.    Cardiovascular: Negative.   Gastrointestinal:  Negative for abdominal pain.  Genitourinary: Negative.   Musculoskeletal: Negative.  Negative for myalgias.  Skin:  Negative for rash.  Neurological:  Negative for tingling, loss of consciousness and weakness.  Endo/Heme/Allergies:  Negative for polydipsia.     Current Outpatient Medications:    Accu-Chek Softclix Lancets lancets, May check glucose up to 3 times daily., Disp: 100 each, Rfl: 12   albuterol (VENTOLIN HFA) 108 (90 Base) MCG/ACT inhaler, INHALE 1-2 PUFFS into THE lungs EVERY 6 HOURS AS NEEDED FOR WHEEZING AND/OR SHORTNESS OF BREATH, Disp: 6.7 g, Rfl: 3   aspirin 81 MG tablet, Take 1 tablet (81 mg total) by mouth daily with breakfast., Disp: 90 tablet, Rfl: 0   atorvastatin (LIPITOR) 20 MG tablet, Take 1 tablet (20 mg total) by mouth daily., Disp: 90 tablet,  Rfl: 3   Blood Glucose Monitoring Suppl (ONETOUCH VERIO SYNC SYSTEM) w/Device KIT, 1 kit by Does not apply route daily as needed., Disp: 1 kit, Rfl: 0   carvedilol (COREG) 25 MG tablet, Take 1 tablet (25 mg total) by mouth 2 (two) times daily., Disp: 180 tablet, Rfl: 3   cholecalciferol (VITAMIN D3) 25 MCG (1000 UNIT) tablet, Take 2,000 Units by mouth daily., Disp: , Rfl:    Continuous Blood Gluc Sensor (FREESTYLE LIBRE 2 SENSOR) MISC, Place sensor on the arm every 14 days and use to monitor blood sugars continuously as directed., Disp: 6 each, Rfl: 3   dapagliflozin propanediol (FARXIGA) 5 MG TABS tablet, Take 1 tablet (5 mg total) by mouth daily before breakfast., Disp: 90 tablet, Rfl: 2   Docusate Sodium 100 MG capsule, Take 100 mg by mouth 2 (two) times daily., Disp: , Rfl:    ferrous sulfate 325 (65 FE) MG tablet, Take 325 mg by mouth 2 (two) times daily with a meal., Disp: , Rfl:    fluticasone (FLONASE) 50 MCG/ACT nasal spray, Place 1 spray into both nostrils daily., Disp: 48 g, Rfl: 1   furosemide (LASIX) 80 MG tablet, Take 1 tablet (80 mg total) by mouth daily., Disp: 90 tablet, Rfl: 1   gabapentin (NEURONTIN) 100 MG capsule, TAKE ONE CAPSULE BY MOUTH TWICE DAILY, Disp: 180 capsule, Rfl: 1   gabapentin (NEURONTIN) 100 MG capsule, Take 1 capsule (100 mg total) by mouth  2 (two) times daily., Disp: 180 capsule, Rfl: 1   glucose blood (ONETOUCH VERIO) test strip, Use to monitor blood sugar twice daily as instructed, Disp: 200 each, Rfl: 3   hydrALAZINE (APRESOLINE) 50 MG tablet, Take 1 tablet (50 mg total) by mouth every 8 (eight) hours., Disp: 90 tablet, Rfl: 0   hydrALAZINE (APRESOLINE) 50 MG tablet, Take 1 tablet (50 mg total) by mouth every 8 (eight) hours., Disp: 90 tablet, Rfl: 3   isosorbide dinitrate (ISORDIL) 10 MG tablet, Take 1 tablet (10 mg total) by mouth 3 (three) times daily. Please keep scheduled appointment, Disp: 90 tablet, Rfl: 7   losartan (COZAAR) 25 MG tablet, Take 1 tablet  (25 mg total) by mouth daily., Disp: 90 tablet, Rfl: 3   losartan (COZAAR) 25 MG tablet, Take 0.5 tablets (12.5 mg total) by mouth daily., Disp: 45 tablet, Rfl: 3   montelukast (SINGULAIR) 10 MG tablet, Take 1 tablet (10 mg total) by mouth at bedtime., Disp: 90 tablet, Rfl: 3   pantoprazole (PROTONIX) 40 MG tablet, Take 1 tablet (40 mg total) by mouth daily., Disp: 90 tablet, Rfl: 1   potassium chloride (KLOR-CON M) 10 MEQ tablet, Take 1 tablet (10 mEq total) by mouth daily., Disp: 60 tablet, Rfl: 2   Objective:     BP 124/68 (BP Location: Left Arm, Patient Position: Sitting, Cuff Size: Large)   Pulse (!) 59   Temp 98.1 F (36.7 C) (Temporal)   Ht 5\' 8"  (1.727 m)   Wt (!) 332 lb 6.4 oz (150.8 kg)   SpO2 95%   BMI 50.54 kg/m    Physical Exam Constitutional:      General: She is not in acute distress.    Appearance: Normal appearance. She is not ill-appearing, toxic-appearing or diaphoretic.  HENT:     Head: Normocephalic and atraumatic.     Right Ear: External ear normal.     Left Ear: External ear normal.  Eyes:     General: No scleral icterus.       Right eye: No discharge.        Left eye: No discharge.     Extraocular Movements: Extraocular movements intact.     Conjunctiva/sclera: Conjunctivae normal.  Cardiovascular:     Rate and Rhythm: Normal rate and regular rhythm.     Heart sounds: Murmur heard.  Pulmonary:     Effort: Pulmonary effort is normal. No respiratory distress.     Breath sounds: Normal breath sounds. No wheezing or rales.  Skin:    General: Skin is warm and dry.  Neurological:     Mental Status: She is alert and oriented to person, place, and time.  Psychiatric:        Mood and Affect: Mood normal.        Behavior: Behavior normal.      No results found for any visits on 07/14/22.    The 10-year ASCVD risk score (Arnett DK, et al., 2019) is: 19.1%    Assessment & Plan:   Type 2 diabetes mellitus with other specified complication,  without long-term current use of insulin (HCC) -     Accu-Chek Softclix Lancets; May check glucose up to 3 times daily.  Dispense: 100 each; Refill: 12 -     Basic metabolic panel -     Hemoglobin A1c  Stage 4 chronic kidney disease (HCC) -     Basic metabolic panel  Class 3 severe obesity with serious comorbidity and body mass index (BMI)  of 50.0 to 59.9 in adult, unspecified obesity type (HCC)    Return in about 3 months (around 10/14/2022), or if symptoms worsen or fail to improve.    Mliss Sax, MD

## 2022-07-15 ENCOUNTER — Other Ambulatory Visit (HOSPITAL_COMMUNITY): Payer: Self-pay

## 2022-07-22 ENCOUNTER — Ambulatory Visit (INDEPENDENT_AMBULATORY_CARE_PROVIDER_SITE_OTHER): Payer: HMO | Admitting: Psychology

## 2022-07-22 DIAGNOSIS — F330 Major depressive disorder, recurrent, mild: Secondary | ICD-10-CM

## 2022-07-22 DIAGNOSIS — F411 Generalized anxiety disorder: Secondary | ICD-10-CM | POA: Diagnosis not present

## 2022-07-25 ENCOUNTER — Ambulatory Visit: Payer: No Typology Code available for payment source | Admitting: Podiatry

## 2022-07-25 ENCOUNTER — Other Ambulatory Visit (HOSPITAL_COMMUNITY): Payer: Self-pay

## 2022-07-25 ENCOUNTER — Other Ambulatory Visit: Payer: Self-pay

## 2022-07-25 NOTE — Progress Notes (Signed)
[facility] Counselor/Therapist Progress Note  Patient ID: Stacey Page, MRN: [id number],    Date: 07/22/2022  Time Spent: 60 minutes  Treatment Type: Individual Therapy  Reported Symptoms: decreased motivation, feeling a little anxious, sadness  Mental Status Exam: Appearance:  Casual     Behavior: Appropriate  Motor: Normal  Speech/Language:  Pressured  Affect: Blunt  Mood: pleasant  Thought process: normal  Thought content:   WNL  Sensory/Perceptual disturbances:   WNL  Orientation: oriented to person, place, time/date, and situation  Attention: Good  Concentration: Good  Memory: WNL  Fund of knowledge:  Good  Insight:   Good  Judgment:  Good  Impulse Control: Good   Risk Assessment: Danger to Self:  No Self-injurious Behavior: No Danger to Others: No Duty to Warn:no Physical Aggression / Violence:No  Access to Firearms a concern: No  Gang Involvement:No   Subjective: The patient attended an individual therapy session in the office today.  The patient presents with a blunted affect and mood is pleasant.  The patient continues to talk about family dynamics with her daughters family.  She is concerned about her grandson and how they parent him.  She talked about them hovering around him.  We talked about how she is helping him cope with this.  The patient also talked about wanting to get a loan for some repairs on her home.  Provided some Cognitive behavioral therapy and problem solving.  In addition we discussed her new alarm system and discussed how to handle some issues that she is having with the people who sold it to her.  Interventions: Cognitive Behavioral Therapy, Assertiveness/Communication, and Insight-Oriented  Diagnosis:Major depressive disorder, recurrent episode, mild (HCC)  Generalized anxiety disorder  Plan: " I need someone to talk to" Supportive family, intelligent Objective Learn and implement problem-solving strategies for  realistically addressing worries. Target Date: 04/21/2023 Frequency: Biweekly Progress: 0 Modality: individual Related Interventions 1. Teach the client problem-solving strategies involving specifically defining a problem,  generating options for addressing it, evaluating the pros and cons of each option, selecting and  implementing an optional action, and reevaluating and refining the action (or assign "Applying  Problem-Solving to Interpersonal Conflict" in the Adult Psychotherapy Homework Planner by  Stephannie Li). Objective Describe situations, thoughts, feelings, and actions associated with anxieties and worries, their impact  on functioning, and attempts to resolve them. Target Date: 04/21/2023 Frequency: Biweekly Progress: 0 Modality: individual Diagnosis  300.02 (Generalized anxiety disorder)  F33.0 (Major depressive affective disorder, recurrent episode, mild Conditions For Discharge Achievement of treatment goals and objectives   Laylanie Kruczek G Jabre Heo, LCSW

## 2022-07-28 ENCOUNTER — Other Ambulatory Visit (HOSPITAL_COMMUNITY): Payer: Self-pay

## 2022-08-02 ENCOUNTER — Ambulatory Visit (INDEPENDENT_AMBULATORY_CARE_PROVIDER_SITE_OTHER): Payer: HMO | Admitting: Psychology

## 2022-08-02 DIAGNOSIS — F330 Major depressive disorder, recurrent, mild: Secondary | ICD-10-CM | POA: Diagnosis not present

## 2022-08-02 DIAGNOSIS — F411 Generalized anxiety disorder: Secondary | ICD-10-CM | POA: Diagnosis not present

## 2022-08-03 DIAGNOSIS — I739 Peripheral vascular disease, unspecified: Secondary | ICD-10-CM | POA: Diagnosis not present

## 2022-08-03 DIAGNOSIS — L600 Ingrowing nail: Secondary | ICD-10-CM | POA: Diagnosis not present

## 2022-08-03 DIAGNOSIS — E1142 Type 2 diabetes mellitus with diabetic polyneuropathy: Secondary | ICD-10-CM | POA: Diagnosis not present

## 2022-08-03 NOTE — Progress Notes (Signed)
[facility] Counselor/Therapist Progress Note  Patient ID: Stacey Page, MRN: [id number],    Date: 08/02/2022  Time In:8:03  Time out 9:00  Treatment Type: Individual Therapy  Reported Symptoms: decreased motivation, feeling a little anxious, sadness  Mental Status Exam: Appearance:  Casual     Behavior: Appropriate  Motor: Normal  Speech/Language:  Pressured  Affect: Blunt  Mood: pleasant  Thought process: normal  Thought content:   WNL  Sensory/Perceptual disturbances:   WNL  Orientation: oriented to person, place, time/date, and situation  Attention: Good  Concentration: Good  Memory: WNL  Fund of knowledge:  Good  Insight:   Good  Judgment:  Good  Impulse Control: Good   Risk Assessment: Danger to Self:  No Self-injurious Behavior: No Danger to Others: No Duty to Warn:no Physical Aggression / Violence:No  Access to Firearms a concern: No  Gang Involvement:No   Subjective: The patient attended an individual therapy session in the office today.  The patient presents with a blunted affect and mood is pleasant.  The patient reports that it has been a stressful week.  She states that her blood pressure is high and she is concerned about this.  I asked her if she wanted me to see about getting someone to take it and she said that she would contact her physician if she took it again and felt that she needed to be seen.  She talked about an argument that she had with her significant other this week from the involvement of his daughter.  We talked about her communication with her his daughter and it seems that his daughter is somewhat jealous of the relationship that she has with him.  The patient spent time venting about what is going on in her life and we did some problem solving related to what she is going to do about her blood pressure. Interventions: Cognitive Behavioral Therapy, Assertiveness/Communication, and Insight-Oriented  Diagnosis:Major  depressive disorder, recurrent episode, mild (HCC)  Generalized anxiety disorder  Plan: " I need someone to talk to" Supportive family, intelligent Objective Learn and implement problem-solving strategies for realistically addressing worries. Target Date: 04/21/2023 Frequency: Biweekly Progress: 10 Modality: individual Related Interventions 1. Teach the client problem-solving strategies involving specifically defining a problem,  generating options for addressing it, evaluating the pros and cons of each option, selecting and  implementing an optional action, and reevaluating and refining the action (or assign "Applying  Problem-Solving to Interpersonal Conflict" in the Adult Psychotherapy Homework Planner by  Stephannie Li). Objective Describe situations, thoughts, feelings, and actions associated with anxieties and worries, their impact  on functioning, and attempts to resolve them. Target Date: 04/21/2023 Frequency: Biweekly Progress: 0 Modality: individual Diagnosis  300.02 (Generalized anxiety disorder)  F33.0 (Major depressive affective disorder, recurrent episode, mild Conditions For Discharge Achievement of treatment goals and objectives  The patient approved this plan.   Tenia Goh G Amonie Wisser, LCSW

## 2022-08-05 ENCOUNTER — Ambulatory Visit: Payer: Self-pay

## 2022-08-05 NOTE — Patient Instructions (Signed)
Visit Information  Thank you for taking time to visit with me today. Please don't hesitate to contact me if I can be of assistance to you.   Following are the goals we discussed today:   Goals Addressed             This Visit's Progress    I want to monitor and manage my CHF       Patient Goals/Self Care Activities: -Patient/Caregiver will self-administer medications as prescribed as evidenced by self-report/primary caregiver report  -Patient/Caregiver will attend all scheduled provider appointments as evidenced by clinician review of documented attendance to scheduled appointments and patient/caregiver report -Patient/Caregiver will call pharmacy for medication refills as evidenced by patient report and review of pharmacy fill history as appropriate -Patient/Caregiver will call provider office for new concerns or questions as evidenced by review of documented incoming telephone call notes and patient report -Patient/Caregiver verbalizes understanding of plan -Patient/Caregiver will focus on medication adherence by taking medications as prescribed  -Weigh daily and record (notify MD with 3 lb weight gain over night or 5 lb in a week) -Follow CHF Action Plan- do a check daily -she is move a little more with her new shoes -monitoring her food         Our next appointment is by telephone on 09/06/22 at 130 pm  Please call the care guide team at [phone] if you need to cancel or reschedule your appointment.   If you are experiencing a Mental Health or Behavioral Health Crisis or need someone to talk to, please call 1-800-273-TALK (toll free, 24 hour hotline)  Patient verbalizes understanding of instructions and care plan provided today and agrees to view in MyChart. Active MyChart status and patient understanding of how to access instructions and care plan via MyChart confirmed with patient.     Juanell Fairly RN, BSN, [facility] Care Coordinator [facility]   Phone:  [phone]

## 2022-08-05 NOTE — Patient Outreach (Signed)
  Care Coordination   Follow Up Visit Note   08/05/2022 Name: Stacey Page MRN: [id number] DOB: 1951-01-14  Stacey Page is a 72 y.o. year old female who sees Mliss Sax, MD for primary care. I spoke with  Stacey Page by phone today.  What matters to the patients health and wellness today?  Stacey Page is managing her health effectively. Despite the stress she's experiencing with her family, her blood pressure, which was 151/68 today, is manageable. She's vigilant about her health, denying any signs of distress such as swelling, chest pain, or shortness of breath. Her glucose level today was 103, and she's making progress in her diet and exercise routine.    Goals Addressed             This Visit's Progress    I want to monitor and manage my CHF       Patient Goals/Self Care Activities: -Patient/Caregiver will self-administer medications as prescribed as evidenced by self-report/primary caregiver report  -Patient/Caregiver will attend all scheduled provider appointments as evidenced by clinician review of documented attendance to scheduled appointments and patient/caregiver report -Patient/Caregiver will call pharmacy for medication refills as evidenced by patient report and review of pharmacy fill history as appropriate -Patient/Caregiver will call provider office for new concerns or questions as evidenced by review of documented incoming telephone call notes and patient report -Patient/Caregiver verbalizes understanding of plan -Patient/Caregiver will focus on medication adherence by taking medications as prescribed  -Weigh daily and record (notify MD with 3 lb weight gain over night or 5 lb in a week) -Follow CHF Action Plan- do a check daily -she is move a little more with her new shoes -monitoring her food         SDOH assessments and interventions completed:  No     Care Coordination Interventions:  Yes, provided   Interventions Today     Flowsheet Row Most Recent Value  Chronic Disease   Chronic disease during today's visit Congestive Heart Failure (CHF)  General Interventions   General Interventions Discussed/Reviewed General Interventions Discussed, General Interventions Reviewed  Exercise Interventions   Exercise Discussed/Reviewed Physical Activity  Physical Activity Discussed/Reviewed Physical Activity Discussed  Education Interventions   Education Provided Provided Education  Provided Verbal Education On Nutrition  Mental Health Interventions   Mental Health Discussed/Reviewed Anxiety  Nutrition Interventions   Nutrition Discussed/Reviewed Nutrition Discussed, Portion sizes, Decreasing sugar intake  Pharmacy Interventions   Pharmacy Dicussed/Reviewed Pharmacy Topics Discussed  Safety Interventions   Safety Discussed/Reviewed Safety Discussed        Follow up plan: Follow up call scheduled for 730/24  130 pm    Encounter Outcome:  Pt. Visit Completed   Juanell Fairly RN, BSN, [facility] Care Coordinator [facility]   Phone: [phone]

## 2022-08-10 ENCOUNTER — Ambulatory Visit: Payer: PPO | Admitting: Podiatry

## 2022-08-15 ENCOUNTER — Ambulatory Visit (INDEPENDENT_AMBULATORY_CARE_PROVIDER_SITE_OTHER): Payer: HMO | Admitting: Adult Health

## 2022-08-15 ENCOUNTER — Other Ambulatory Visit: Payer: Self-pay

## 2022-08-15 ENCOUNTER — Encounter (INDEPENDENT_AMBULATORY_CARE_PROVIDER_SITE_OTHER): Payer: Self-pay | Admitting: Adult Health

## 2022-08-15 VITALS — BP 140/72 | HR 60 | Temp 97.8°F | Ht 68.0 in | Wt 331.0 lb

## 2022-08-15 DIAGNOSIS — N184 Chronic kidney disease, stage 4 (severe): Secondary | ICD-10-CM

## 2022-08-15 DIAGNOSIS — I152 Hypertension secondary to endocrine disorders: Secondary | ICD-10-CM | POA: Diagnosis not present

## 2022-08-15 DIAGNOSIS — E65 Localized adiposity: Secondary | ICD-10-CM

## 2022-08-15 DIAGNOSIS — E1159 Type 2 diabetes mellitus with other circulatory complications: Secondary | ICD-10-CM

## 2022-08-15 DIAGNOSIS — Z6841 Body Mass Index (BMI) 40.0 and over, adult: Secondary | ICD-10-CM | POA: Diagnosis not present

## 2022-08-15 NOTE — Progress Notes (Signed)
WEIGHT SUMMARY AND BIOMETRICS  Vitals Temp: 97.8 F (36.6 C) BP: (!) 140/72 Pulse Rate: 60 SpO2: 92 %   Anthropometric Measurements Height: 5\' 8"  (1.727 m) Weight: (!) 331 lb (150.1 kg) BMI (Calculated): 50.34 Weight at Last Visit: 329lb Weight Lost Since Last Visit: 0 Weight Gained Since Last Visit: 2lb Starting Weight: 344lb Total Weight Loss (lbs): 13 lb (5.897 kg)   Body Composition  Body Fat %: 56.9 % Fat Mass (lbs): 188.4 lbs Muscle Mass (lbs): 135.4 lbs Visceral Fat Rating : 23   Other Clinical Data Fasting: No Labs: No Today's Visit #: 81 Starting Date: 03/25/19    Chief Complaint:   OBESITY Stacey Page is here to discuss Stacey Page progress with Stacey Page obesity treatment plan. Stacey Page is on the the Category 2 Plan and states Stacey Page is following Stacey Page eating plan approximately 50 % of the time. Stacey Page states Stacey Page is exercising Walking 10 minutes 3 times per week.   Interim History:  Stacey Page had chronic follow-up with PCP/labs in 07/14/2022  Stacey Page has been reading labels and trying to keep 1-2 servings of food "off plan".  Stacey Page reports fatigue r/t to "large belly" and feeling "like Stacey Page is being pulled down".  Stacey Page has never been evaluated by Plastics.  Stacey Page endorses chronic lower back pain.  Subjective:   1. Stage 4 chronic kidney disease (HCC) PCP recently ram CMP  Latest Reference Range & Units 02/08/22 14:37 02/22/22 09:22 07/14/22 13:53  GFR >60.00 mL/min 24.17 (L) 25.82 (L) 23.55 (L)  (L): Data is abnormally low Stacey Page is on Losartan therapy  2. Hypertension associated with diabetes (HCC) BP has been trending up the last 4 weeks at home SBP readings:  DBP readings: Stacey Page is currently on isosorbide dinitrate (ISORDIL) 10 MG tablet- TID  carvedilol (COREG) 25 MG tablet- BID  furosemide (LASIX) 80 MG tablet- QD  hydrALAZINE (APRESOLINE) 50 MG tablet- TID  losartan (COZAAR) 25 MG tablet- 1/2 tab QD   3. Pendulous Abdomen. Stacey Page reports fatigue r/t to "large belly" and  feeling "like Stacey Page is being pulled down".  Stacey Page has never been evaluated by Plastics.  Stacey Page endorses chronic lower back pain. Lab Results  Component Value Date   HGBA1C 6.5 07/14/2022   HGBA1C 6.5 04/21/2022   HGBA1C 6.3 (H) 11/12/2021     Assessment/Plan:   1. Stage 4 chronic kidney disease (HCC) Continue to avoid Nephrotoxic substances and regular f/u with Nephrology as directed.  2. Hypertension associated with diabetes (HCC) Continue isosorbide dinitrate (ISORDIL) 10 MG tablet- TID  carvedilol (COREG) 25 MG tablet- BID  furosemide (LASIX) 80 MG tablet- QD  hydrALAZINE (APRESOLINE) 50 MG tablet- TID  losartan (COZAAR) 25 MG tablet- 1/2 tab QD   3. Pendulous Abdomen. Referral  4. Class 3 severe obesity with serious comorbidity and body mass index (BMI) of 50.0 to 59.9 in adult, unspecified obesity type (HCC) Stacey Page is currently in the action stage of change. As such, Stacey Page goal is to continue with weight loss efforts. Stacey Page has agreed to the Category 2 Plan.   Handouts: 100/200 Snack Hand Outs  Exercise goals: Older adults should follow the adult guidelines. When older adults cannot meet the adult guidelines, they should be as physically active as their abilities and conditions will allow.  Older adults should do exercises that maintain or improve balance if they are at risk of falling.   Behavioral modification strategies: increasing lean protein intake, decreasing simple carbohydrates, increasing vegetables, increasing water intake, decreasing sodium intake, meal  planning and cooking strategies, keeping healthy foods in the home, and planning for success.  Stacey Page has agreed to follow-up with our clinic in 4 weeks. Stacey Page was informed of the importance of frequent follow-up visits to maximize Stacey Page success with intensive lifestyle modifications for Stacey Page multiple health conditions.   Objective:   Blood pressure (!) 140/72, pulse 60, temperature 97.8 F (36.6 C), height 5\' 8"  (1.727  m), weight (!) 331 lb (150.1 kg), SpO2 92 %. Body mass index is 50.33 kg/m.  General: Cooperative, alert, well developed, in no acute distress. HEENT: Conjunctivae and lids unremarkable. Cardiovascular: Regular rhythm.  Lungs: Normal work of breathing. Neurologic: No focal deficits.   Lab Results  Component Value Date   CREATININE 2.09 (H) 07/14/2022   BUN 26 (H) 07/14/2022   NA 141 07/14/2022   K 4.2 07/14/2022   CL 100 07/14/2022   CO2 33 (H) 07/14/2022   Lab Results  Component Value Date   ALT 9 01/25/2022   AST 17 01/25/2022   ALKPHOS 65 01/25/2022   BILITOT 0.4 01/25/2022   Lab Results  Component Value Date   HGBA1C 6.5 07/14/2022   HGBA1C 6.5 04/21/2022   HGBA1C 6.3 (H) 11/12/2021   HGBA1C 6.0 (A) 07/14/2021   HGBA1C 6.4 (A) 04/14/2021   Lab Results  Component Value Date   INSULIN 8.6 10/28/2019   INSULIN 9.2 07/04/2019   INSULIN 8.8 03/25/2019   Lab Results  Component Value Date   TSH 0.51 11/28/2018   Lab Results  Component Value Date   CHOL 144 01/14/2022   HDL 57.10 01/14/2022   LDLCALC 73 01/14/2022   TRIG 66.0 01/14/2022   CHOLHDL 3 01/14/2022   Lab Results  Component Value Date   VD25OH 57.6 10/28/2019   VD25OH 41.5 07/04/2019   VD25OH 38.2 03/25/2019   Lab Results  Component Value Date   WBC 9.5 02/01/2022   HGB 12.3 02/01/2022   HCT 38.3 02/01/2022   MCV 90.1 02/01/2022   PLT 287 02/01/2022   Lab Results  Component Value Date   IRON 29 01/25/2022   TIBC 260 01/25/2022   FERRITIN 51 01/25/2022     Attestation Statements:   Reviewed by clinician on day of visit: allergies, medications, problem list, medical history, surgical history, family history, social history, and previous encounter notes.  Time spent on visit including pre-visit chart review and post-visit care and charting was 28 minutes.   I have reviewed the above documentation for accuracy and completeness, and I agree with the above. -  Helane Briceno d. Shabre Kreher, NP-C

## 2022-08-16 ENCOUNTER — Ambulatory Visit (INDEPENDENT_AMBULATORY_CARE_PROVIDER_SITE_OTHER): Payer: HMO | Admitting: Psychology

## 2022-08-16 DIAGNOSIS — F330 Major depressive disorder, recurrent, mild: Secondary | ICD-10-CM | POA: Diagnosis not present

## 2022-08-16 DIAGNOSIS — F411 Generalized anxiety disorder: Secondary | ICD-10-CM

## 2022-08-17 NOTE — Progress Notes (Signed)
[facility] Counselor/Therapist Progress Note  Patient ID: Stacey Page, MRN: [id number],    Date: 08/16/2022  Time spent:  60 minutesTime In:8:00 Time out 9:00  Treatment Type: Individual Therapy  Reported Symptoms: decreased motivation, feeling a little anxious, sadness  Mental Status Exam: Appearance:  Casual     Behavior: Appropriate  Motor: Normal  Speech/Language:  Pressured  Affect: Blunt  Mood: pleasant  Thought process: normal  Thought content:   WNL  Sensory/Perceptual disturbances:   WNL  Orientation: oriented to person, place, time/date, and situation  Attention: Good  Concentration: Good  Memory: WNL  Fund of knowledge:  Good  Insight:   Good  Judgment:  Good  Impulse Control: Good   Risk Assessment: Danger to Self:  No Self-injurious Behavior: No Danger to Others: No Duty to Warn:no Physical Aggression / Violence:No  Access to Firearms a concern: No  Gang Involvement:No   Subjective: The patient attended an individual therapy session in the office today.  The patient presents with a blunted affect and mood is pleasant.  The patient reports that she is still having issues with her alarm company.  She wanted to do some problem solving in regard to how to handle that situation with them.  They apparently are coming out and she is hoping that they will pack up and take the products back as she has not been getting the service that she needs.  She did report that she canceled her check.  The patient also talked about her daughter and her grandchildren.  We processed how to handle the situation with her grandson that lives with her.  Today used problem solving for the majority of the session.  Interventions: Cognitive Behavioral Therapy, Assertiveness/Communication, and Insight-Oriented, problem solving  Diagnosis:Major depressive disorder, recurrent episode, mild (HCC)  Generalized anxiety disorder  Plan: " I need someone to talk  to" Supportive family, intelligent Objective Learn and implement problem-solving strategies for realistically addressing worries. Target Date: 04/21/2023 Frequency: Biweekly Progress: 10 Modality: individual Related Interventions 1. Teach the client problem-solving strategies involving specifically defining a problem,  generating options for addressing it, evaluating the pros and cons of each option, selecting and  implementing an optional action, and reevaluating and refining the action (or assign "Applying  Problem-Solving to Interpersonal Conflict" in the Adult Psychotherapy Homework Planner by  Stephannie Li). Objective Describe situations, thoughts, feelings, and actions associated with anxieties and worries, their impact  on functioning, and attempts to resolve them. Target Date: 04/21/2023 Frequency: Biweekly Progress: 10 Modality: individual Diagnosis  300.02 (Generalized anxiety disorder)  F33.0 (Major depressive affective disorder, recurrent episode, mild Conditions For Discharge Achievement of treatment goals and objectives  The patient approved this plan.   Lilliona Blakeney G Alauna Hayden, LCSW               Bralen Wiltgen G Cidney Kirkwood, LCSW

## 2022-08-22 ENCOUNTER — Other Ambulatory Visit (HOSPITAL_COMMUNITY): Payer: Self-pay

## 2022-08-22 ENCOUNTER — Other Ambulatory Visit: Payer: Self-pay

## 2022-08-23 ENCOUNTER — Encounter: Payer: Self-pay | Admitting: Family Medicine

## 2022-08-23 ENCOUNTER — Other Ambulatory Visit (HOSPITAL_COMMUNITY): Payer: Self-pay

## 2022-08-23 ENCOUNTER — Ambulatory Visit: Payer: HMO | Admitting: Family Medicine

## 2022-08-23 ENCOUNTER — Other Ambulatory Visit: Payer: Self-pay

## 2022-08-23 VITALS — BP 132/82 | HR 58 | Temp 97.6°F | Ht 68.0 in | Wt 333.6 lb

## 2022-08-23 DIAGNOSIS — E1159 Type 2 diabetes mellitus with other circulatory complications: Secondary | ICD-10-CM | POA: Diagnosis not present

## 2022-08-23 DIAGNOSIS — L720 Epidermal cyst: Secondary | ICD-10-CM

## 2022-08-23 DIAGNOSIS — I152 Hypertension secondary to endocrine disorders: Secondary | ICD-10-CM | POA: Diagnosis not present

## 2022-08-23 MED ORDER — DOXYCYCLINE HYCLATE 100 MG PO TABS
100.0000 mg | ORAL_TABLET | Freq: Two times a day (BID) | ORAL | 0 refills | Status: AC
Start: 2022-08-23 — End: 2022-09-02
  Filled 2022-08-23 (×2): qty 20, 10d supply, fill #0

## 2022-08-23 NOTE — Progress Notes (Signed)
Established Patient Office Visit   Subjective:  Patient ID: Stacey Page, female    DOB: 03/15/1950  Age: 72 y.o. MRN: [id number]  Chief Complaint  Patient presents with   Mass    Bump on th right side of her face that is becoming more painful and hard x 2 weeks.     HPI Encounter Diagnoses  Name Primary?   Inclusion cyst Yes   Hypertension associated with diabetes (HCC)    Slowly enlarging papule on right cheek over the last week or 2.  There is some tenderness to palpation.  There is been no discharge.  No fevers or chills.  Blood pressure has been running higher at home.   Review of Systems  Constitutional: Negative.   HENT: Negative.    Eyes:  Negative for blurred vision, discharge and redness.  Respiratory: Negative.    Cardiovascular: Negative.   Gastrointestinal:  Negative for abdominal pain.  Genitourinary: Negative.   Musculoskeletal: Negative.  Negative for myalgias.  Skin:  Negative for rash.  Neurological:  Negative for tingling, loss of consciousness and weakness.  Endo/Heme/Allergies:  Negative for polydipsia.     Current Outpatient Medications:    Accu-Chek Softclix Lancets lancets, May check glucose up to 3 times daily., Disp: 100 each, Rfl: 12   albuterol (VENTOLIN HFA) 108 (90 Base) MCG/ACT inhaler, INHALE 1-2 PUFFS into THE lungs EVERY 6 HOURS AS NEEDED FOR WHEEZING AND/OR SHORTNESS OF BREATH, Disp: 6.7 g, Rfl: 3   aspirin 81 MG tablet, Take 1 tablet (81 mg total) by mouth daily with breakfast., Disp: 90 tablet, Rfl: 0   atorvastatin (LIPITOR) 20 MG tablet, Take 1 tablet (20 mg total) by mouth daily., Disp: 90 tablet, Rfl: 3   Blood Glucose Monitoring Suppl (ONETOUCH VERIO SYNC SYSTEM) w/Device KIT, 1 kit by Does not apply route daily as needed., Disp: 1 kit, Rfl: 0   carvedilol (COREG) 25 MG tablet, Take 1 tablet (25 mg total) by mouth 2 (two) times daily., Disp: 180 tablet, Rfl: 3   cholecalciferol (VITAMIN D3) 25 MCG (1000 UNIT) tablet, Take  2,000 Units by mouth daily., Disp: , Rfl:    Continuous Blood Gluc Sensor (FREESTYLE LIBRE 2 SENSOR) MISC, Place sensor on the arm every 14 days and use to monitor blood sugars continuously as directed., Disp: 6 each, Rfl: 3   dapagliflozin propanediol (FARXIGA) 5 MG TABS tablet, Take 1 tablet (5 mg total) by mouth daily before breakfast., Disp: 90 tablet, Rfl: 2   Docusate Sodium 100 MG capsule, Take 100 mg by mouth 2 (two) times daily., Disp: , Rfl:    doxycycline (VIBRA-TABS) 100 MG tablet, Take 1 tablet (100 mg total) by mouth 2 (two) times daily for 10 days., Disp: 20 tablet, Rfl: 0   ferrous sulfate 325 (65 FE) MG tablet, Take 325 mg by mouth 2 (two) times daily with a meal., Disp: , Rfl:    fluticasone (FLONASE) 50 MCG/ACT nasal spray, Place 1 spray into both nostrils daily., Disp: 48 g, Rfl: 1   furosemide (LASIX) 80 MG tablet, Take 1 tablet (80 mg total) by mouth daily., Disp: 90 tablet, Rfl: 1   gabapentin (NEURONTIN) 100 MG capsule, TAKE ONE CAPSULE BY MOUTH TWICE DAILY, Disp: 180 capsule, Rfl: 1   gabapentin (NEURONTIN) 100 MG capsule, Take 1 capsule (100 mg total) by mouth 2 (two) times daily., Disp: 180 capsule, Rfl: 1   glucose blood (ONETOUCH VERIO) test strip, Use to monitor blood sugar twice daily as instructed, Disp:  200 each, Rfl: 3   hydrALAZINE (APRESOLINE) 50 MG tablet, Take 1 tablet (50 mg total) by mouth every 8 (eight) hours., Disp: 90 tablet, Rfl: 0   hydrALAZINE (APRESOLINE) 50 MG tablet, Take 1 tablet (50 mg total) by mouth every 8 (eight) hours., Disp: 90 tablet, Rfl: 3   isosorbide dinitrate (ISORDIL) 10 MG tablet, Take 1 tablet (10 mg total) by mouth 3 (three) times daily. Please keep scheduled appointment, Disp: 90 tablet, Rfl: 7   losartan (COZAAR) 25 MG tablet, Take 1 tablet (25 mg total) by mouth daily., Disp: 90 tablet, Rfl: 3   losartan (COZAAR) 25 MG tablet, Take 0.5 tablets (12.5 mg total) by mouth daily., Disp: 45 tablet, Rfl: 3   montelukast (SINGULAIR) 10 MG  tablet, Take 1 tablet (10 mg total) by mouth at bedtime., Disp: 90 tablet, Rfl: 3   pantoprazole (PROTONIX) 40 MG tablet, Take 1 tablet (40 mg total) by mouth daily., Disp: 90 tablet, Rfl: 1   potassium chloride (KLOR-CON M) 10 MEQ tablet, Take 1 tablet (10 mEq total) by mouth daily., Disp: 60 tablet, Rfl: 2   Objective:     BP 132/82   Pulse (!) 58   Temp 97.6 F (36.4 C)   Ht 5\' 8"  (1.727 m)   Wt (!) 333 lb 9.6 oz (151.3 kg)   SpO2 97%   BMI 50.72 kg/m    Physical Exam Constitutional:      General: She is not in acute distress.    Appearance: Normal appearance. She is not ill-appearing, toxic-appearing or diaphoretic.  HENT:     Head: Normocephalic and atraumatic.     Right Ear: External ear normal.     Left Ear: External ear normal.  Eyes:     General: No scleral icterus.       Right eye: No discharge.        Left eye: No discharge.     Extraocular Movements: Extraocular movements intact.     Conjunctiva/sclera: Conjunctivae normal.  Cardiovascular:     Rate and Rhythm: Normal rate and regular rhythm.     Heart sounds: Murmur heard.  Pulmonary:     Effort: Pulmonary effort is normal. No respiratory distress.     Breath sounds: No wheezing, rhonchi or rales.  Skin:    General: Skin is warm and dry.       Neurological:     Mental Status: She is alert and oriented to person, place, and time.  Psychiatric:        Mood and Affect: Mood normal.        Behavior: Behavior normal.      No results found for any visits on 08/23/22.    The 10-year ASCVD risk score (Arnett DK, et al., 2019) is: 21.3%    Assessment & Plan:   Inclusion cyst -     Doxycycline Hyclate; Take 1 tablet (100 mg total) by mouth 2 (two) times daily for 10 days.  Dispense: 20 tablet; Refill: 0  Hypertension associated with diabetes (HCC)    Return Bring BP cuff with you next visit.  Should have visit scheduled for early September..  Take Doxy with food twice daily for 10 days.  Avoid  sun exposure.  Will use warm compresses.  Okay if lesion drains.  Return if worsening.  Please bring BP cuff with you for early September visit.  Mliss Sax, MD

## 2022-08-24 ENCOUNTER — Other Ambulatory Visit: Payer: Self-pay

## 2022-08-24 ENCOUNTER — Other Ambulatory Visit (HOSPITAL_COMMUNITY): Payer: Self-pay

## 2022-08-26 ENCOUNTER — Other Ambulatory Visit: Payer: Self-pay | Admitting: Family Medicine

## 2022-08-26 DIAGNOSIS — Z0000 Encounter for general adult medical examination without abnormal findings: Secondary | ICD-10-CM

## 2022-08-29 ENCOUNTER — Other Ambulatory Visit (HOSPITAL_COMMUNITY): Payer: Self-pay

## 2022-08-30 ENCOUNTER — Other Ambulatory Visit (HOSPITAL_COMMUNITY): Payer: Self-pay

## 2022-08-30 ENCOUNTER — Ambulatory Visit (INDEPENDENT_AMBULATORY_CARE_PROVIDER_SITE_OTHER): Payer: HMO | Admitting: Psychology

## 2022-08-30 DIAGNOSIS — F411 Generalized anxiety disorder: Secondary | ICD-10-CM

## 2022-08-30 DIAGNOSIS — F330 Major depressive disorder, recurrent, mild: Secondary | ICD-10-CM | POA: Diagnosis not present

## 2022-08-30 NOTE — Progress Notes (Signed)
[facility] Counselor/Therapist Progress Note  Patient ID: Stacey Page, MRN: [id number],    Date: 08/30/2022  Time spent:  60 minutesTime In:9:00 Time out 10:00  Treatment Type: Individual Therapy  Reported Symptoms: decreased motivation, feeling a little anxious, sadness  Mental Status Exam: Appearance:  Casual     Behavior: Appropriate  Motor: Normal  Speech/Language:  Pressured  Affect: Blunt  Mood: pleasant  Thought process: normal  Thought content:   WNL  Sensory/Perceptual disturbances:   WNL  Orientation: oriented to person, place, time/date, and situation  Attention: Good  Concentration: Good  Memory: WNL  Fund of knowledge:  Good  Insight:   Good  Judgment:  Good  Impulse Control: Good   Risk Assessment: Danger to Self:  No Self-injurious Behavior: No Danger to Others: No Duty to Warn:no Physical Aggression / Violence:No  Access to Firearms a concern: No  Gang Involvement:No   Subjective: The patient attended an individual therapy session in the office today.  The patient presents with a blunted affect and mood is pleasant.  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  We talked about the need for her to possibly write a letter so that she can have it in writing that she is not getting the service that she needs.  In addition she talked about being frustrated about having to drive her daughter around a lot and she is encouraging them to try to get their own vehicle.  In addition she talked about her insurance and I gave her a resource to check into so that she can make sure that she is on the right plan moving forward. Interventions: Cognitive Behavioral Therapy, Assertiveness/Communication, and Insight-Oriented, problem solving  Diagnosis:Major depressive disorder, recurrent episode, mild (HCC)  Generalized anxiety disorder  Plan: " I need someone to talk to" Supportive family,  intelligent Objective Learn and implement problem-solving strategies for realistically addressing worries. Target Date: 04/21/2023 Frequency: Biweekly Progress: 10 Modality: individual Related Interventions 1. Teach the client problem-solving strategies involving specifically defining a problem,  generating options for addressing it, evaluating the pros and cons of each option, selecting and  implementing an optional action, and reevaluating and refining the action (or assign "Applying  Problem-Solving to Interpersonal Conflict" in the Adult Psychotherapy Homework Planner by  Stephannie Li). Objective Describe situations, thoughts, feelings, and actions associated with anxieties and worries, their impact  on functioning, and attempts to resolve them. Target Date: 04/21/2023 Frequency: Biweekly Progress: 10 Modality: individual Diagnosis  300.02 (Generalized anxiety disorder)  F33.0 (Major depressive affective disorder, recurrent episode, mild Conditions For Discharge Achievement of treatment goals and objectives  The patient approved this plan.   Maddox Bratcher G Fabrice Dyal, LCSW

## 2022-08-31 ENCOUNTER — Encounter (HOSPITAL_BASED_OUTPATIENT_CLINIC_OR_DEPARTMENT_OTHER): Payer: Self-pay | Admitting: Pulmonary Disease

## 2022-08-31 ENCOUNTER — Ambulatory Visit (HOSPITAL_BASED_OUTPATIENT_CLINIC_OR_DEPARTMENT_OTHER): Payer: HMO | Admitting: Pulmonary Disease

## 2022-08-31 VITALS — BP 120/70 | HR 67 | Resp 20 | Ht 68.0 in | Wt 332.6 lb

## 2022-08-31 DIAGNOSIS — G4733 Obstructive sleep apnea (adult) (pediatric): Secondary | ICD-10-CM | POA: Diagnosis not present

## 2022-08-31 NOTE — Patient Instructions (Signed)
Will have [employer] arrange for a chin strap to use with your CPAP mask  Follow up in 6 months

## 2022-08-31 NOTE — Progress Notes (Signed)
[facility]  Chief Complaint  Patient presents with   Follow-up    States that she is hanging in there.    Constitutional:  BP 120/70   Pulse 67   Resp 20   Ht 5\' 8"  (1.727 m)   Wt (!) 332 lb 9.6 oz (150.9 kg)   SpO2 96%   BMI 50.57 kg/m   Past Medical History:  Anxiety, OA, Diastolic CHF, Aortic stenosis, Depression, DM type 2, Eczema, GERD, HTN, Peripheral neuropathy  Past Surgical History:  She  has a past surgical history that includes Cesarean section (x2); Polypectomy; Knee arthroscopy (Right); Video assisted thoracoscopy (Right, 06/10/2014); Lung biopsy (Right, 06/10/2014); Colonoscopy; Total knee arthroplasty (Right, 03/14/2016); Joint replacement; transthoracic echocardiogram (10/10/2021); CORONARY CALCIUM SCORE & CT ANGIOGRAM (09/2017); Breast lumpectomy with radioactive seed localization (Right, 12/28/2018); Breast surgery; Breast biopsy (Right, 03/2018); transthoracic echocardiogram (06/14/2021); CARDIOPULMONARY EXERCISE TEST (CPX) (06/21/2021); and RIGHT HEART CATH (N/A, 10/15/2021).  Brief Summary:  Stacey Page is a 72 y.o. female former smoker with asthma, and OSA.  She has history of MAI.      Subjective:   Breathing has been stable.  She has more trouble with she lays flat.  Feels her weight is causing most of her breathing issues.  Not having cough, wheeze, or chest congestion.  Using CPAP at night.  Mask leaks and gets very dry mouth.  Using oxygen.  Physical Exam:   Appearance - well kempt, wearing oxygen  ENMT - no sinus tenderness, no oral exudate, no LAN, Mallampati 3 airway, no stridor  Respiratory - equal breath sounds bilaterally, no wheezing or rales  CV - s1s2 regular rate and rhythm, no murmurs  Ext - no clubbing, no edema  Skin - no rashes  Psych - normal mood and affect     Pulmonary testing:  PFT 03/12/14 >> FEV1 2.05 (88%), FEV1% 87, TLC 4.14 (75%), DLCO 52%, no BD VATS lung bx  06/10/14 >> bronchiolitis with non necrotizing granuloma and AFB PFT 01/30/22 >> FEV1 1.60 (62%), FEV1% 85, TLC 3.83 (69%), DLCO 62%   Chest Imaging:  HRCT chest 03/20/14 >> multiple small nodules, patchy GGO, mild BTX, air trapping Cardiac CT 10/06/17 >> scattered areas of GGO w/o change from 2016 HRCT chest 01/31/22 >> mild fibrosis pattern with relatively heterogeneous, asymmetric distribution of peribronchovascular and subpleural ground-glass, architectural distortion, and scattered areas of subpleural bronchiolectasis conspicuously in the anterior left upper lobe, stable subpleural nodules, air trapping (no change from 2016)  Sleep Tests:  HST 02/12/14 >> AHI 59.3, SaO2 low 67% Auto CPAP 01/25/22 to 02/23/22 >> used on 30 of 30 nights with average 6 hrs 23 min.  Average AHI 7.9 with median CPAP 10 and 95 th percentile CPAP 11 cm H2O   Cardiac Tests:  Echo 10/10/21 >> EF 70 to 75%, mod LVH, mod LA dilation, mod AS   Social History:  She  reports that she quit smoking about 42 years ago. Her smoking use included cigarettes. She started smoking about 57 years ago. She has a 3.8 pack-year smoking history. She has quit using smokeless tobacco. She reports current alcohol use. She reports that she does not currently use drugs.  Family History:  Her family history includes Alcoholism in her father; Anemia in her father; COPD in her father; Cancer (age of onset: 40) in her mother; High Cholesterol in her mother; High blood pressure in her father and mother; Hypothyroidism in her father.  Assessment/Plan:   Obstructive sleep apnea. - she is compliant with CPAP and reports benefit from therapy - she uses [employer] for her DME - current CPAP ordered October 2023 - continue auto CPAP 7 to 14 cm H2O - will arrange for a chin strap  Allergic asthma. - continue singulair 10 mg nightly - prn albuterol   Chronic hypoxic respiratory failure. - continue 2 liters oxygen 24/7  Aortic stenosis,  chronic diastolic CHF. - followed by Dr. Bryan Lemma with [facility] - suspect much of her recent symptoms are related to valvular heart disease  Obesity. - discussed importance of weight loss  Time Spent Involved in Patient Care on Day of Examination:  26 minutes  Follow up:   Patient Instructions  Will have [employer] arrange for a chin strap to use with your CPAP mask  Follow up in 6 months  Medication List:   Allergies as of 08/31/2022   No Known Allergies      Medication List        Accurate as of August 31, 2022 11:36 AM. If you have any questions, ask your nurse or doctor.          Accu-Chek Softclix Lancets lancets May check glucose up to 3 times daily.   albuterol 108 (90 Base) MCG/ACT inhaler Commonly known as: VENTOLIN HFA INHALE 1-2 PUFFS into THE lungs EVERY 6 HOURS AS NEEDED FOR WHEEZING AND/OR SHORTNESS OF BREATH   aspirin EC 81 MG tablet Take 1 tablet (81 mg total) by mouth daily with breakfast.   atorvastatin 20 MG tablet Commonly known as: LIPITOR Take 1 tablet (20 mg total) by mouth daily.   carvedilol 25 MG tablet Commonly known as: COREG Take 1 tablet (25 mg total) by mouth 2 (two) times daily.   cholecalciferol 25 MCG (1000 UNIT) tablet Commonly known as: VITAMIN D3 Take 2,000 Units by mouth daily.   dapagliflozin propanediol 5 MG Tabs tablet Commonly known as: Farxiga Take 1 tablet (5 mg total) by mouth daily before breakfast.   Docusate Sodium 100 MG capsule Take 100 mg by mouth 2 (two) times daily.   doxycycline 100 MG tablet Commonly known as: VIBRA-TABS Take 1 tablet (100 mg total) by mouth 2 (two) times daily for 10 days.   ferrous sulfate 325 (65 FE) MG tablet Take 325 mg by mouth 2 (two) times daily with a meal.   fluticasone 50 MCG/ACT nasal spray Commonly known as: FLONASE Place 1 spray into both nostrils daily.   FreeStyle Libre 2 Sensor Misc Place sensor on the arm every 14 days and use to monitor blood sugars  continuously as directed.   furosemide 80 MG tablet Commonly known as: LASIX Take 1 tablet (80 mg total) by mouth daily.   gabapentin 100 MG capsule Commonly known as: NEURONTIN Take 1 capsule (100 mg total) by mouth 2 (two) times daily. What changed: Another medication with the same name was removed. Continue taking this medication, and follow the directions you see here. Changed by: Coralyn Helling   hydrALAZINE 50 MG tablet Commonly known as: APRESOLINE Take 1 tablet (50 mg total) by mouth every 8 (eight) hours. What changed: Another medication with the same name was removed. Continue taking this medication, and follow the directions you see here. Changed by: Coralyn Helling   isosorbide dinitrate 10 MG tablet Commonly known as: ISORDIL Take 1 tablet (10 mg total) by mouth 3 (three) times daily. Please keep scheduled appointment   losartan 25 MG tablet Commonly  known as: COZAAR Take 1 tablet (25 mg total) by mouth daily.   losartan 25 MG tablet Commonly known as: COZAAR Take 0.5 tablets (12.5 mg total) by mouth daily.   montelukast 10 MG tablet Commonly known as: SINGULAIR Take 1 tablet (10 mg total) by mouth at bedtime.   OneTouch Verio Sync System w/Device Kit 1 kit by Does not apply route daily as needed.   OneTouch Verio test strip Generic drug: glucose blood Use to monitor blood sugar twice daily as instructed   pantoprazole 40 MG tablet Commonly known as: PROTONIX Take 1 tablet (40 mg total) by mouth daily.   potassium chloride 10 MEQ tablet Commonly known as: KLOR-CON M Take 1 tablet (10 mEq total) by mouth daily.        Signature:  Coralyn Helling, MD [facility] Pager - [phone] 08/31/2022, 11:36 AM

## 2022-09-01 ENCOUNTER — Ambulatory Visit: Payer: HMO | Admitting: Family Medicine

## 2022-09-05 ENCOUNTER — Telehealth: Payer: Self-pay | Admitting: Family Medicine

## 2022-09-05 NOTE — Telephone Encounter (Signed)
Pt is requesting that DME work order be filled out for her to get a portable and at home Oxygen from [employer]

## 2022-09-06 ENCOUNTER — Telehealth: Payer: Self-pay | Admitting: Pulmonary Disease

## 2022-09-06 ENCOUNTER — Ambulatory Visit: Payer: Self-pay

## 2022-09-06 DIAGNOSIS — G4733 Obstructive sleep apnea (adult) (pediatric): Secondary | ICD-10-CM

## 2022-09-06 NOTE — Patient Instructions (Signed)
Visit Information  Thank you for taking time to visit with me today. Please don't hesitate to contact me if I can be of assistance to you.   Following are the goals we discussed today:   Goals Addressed             This Visit's Progress    I want to monitor and manage my CHF       Patient Goals/Self Care Activities: -Patient/Caregiver will self-administer medications as prescribed as evidenced by self-report/primary caregiver report  -Patient/Caregiver will attend all scheduled provider appointments as evidenced by clinician review of documented attendance to scheduled appointments and patient/caregiver report -Patient/Caregiver will call pharmacy for medication refills as evidenced by patient report and review of pharmacy fill history as appropriate -Patient/Caregiver will call provider office for new concerns or questions as evidenced by review of documented incoming telephone call notes and patient report --Patient/Caregiver will focus on medication adherence by taking medications as prescribed  -Weigh daily and record (notify MD with 3 lb weight gain over night or 5 lb in a week) -Follow CHF Action Plan- do a check daily -monitoring her food         Our next appointment is by telephone on 10/07/22 at 2 pm  Please call the care guide team at [phone] if you need to cancel or reschedule your appointment.   If you are experiencing a Mental Health or Behavioral Health Crisis or need someone to talk to, please call 1-800-273-TALK (toll free, 24 hour hotline)  Patient verbalizes understanding of instructions and care plan provided today and agrees to view in MyChart. Active MyChart status and patient understanding of how to access instructions and care plan via MyChart confirmed with patient.     Juanell Fairly RN, BSN, [facility] Care Coordinator [facility]   Phone: [phone]

## 2022-09-06 NOTE — Telephone Encounter (Signed)
Kiasha please call Mitchell about this. She has been told something else and I was not sure.

## 2022-09-06 NOTE — Patient Outreach (Signed)
  Care Coordination   Follow Up Visit Note   09/06/2022 Name: Stacey Page MRN: [id number] DOB: 14-Sep-1950  Stacey Page is a 72 y.o. year old female who sees Mliss Sax, MD for primary care. I spoke with  Stacey Page by phone today.  What matters to the patients health and wellness today?  Stacey Page reports overall well-being, but her blood pressure readings have been elevated at home; they have been within the normal range during office visits, leading the physician to suspect inaccuracies in her monitoring equipment. Verification of this will be done during her next appointment. Stacey Page is actively addressing her weight and engaging in regular exercise but has recently experienced difficulty walking due to an injury to her big toe. In addition, she is scheduled for a cardiology appointment on 10/17/22 at 4 pm. She denies any presence of swelling or chest pain but acknowledges experiencing some degree of shortness of breath.     Goals Addressed             This Visit's Progress    I want to monitor and manage my CHF       Patient Goals/Self Care Activities: -Patient/Caregiver will self-administer medications as prescribed as evidenced by self-report/primary caregiver report  -Patient/Caregiver will attend all scheduled provider appointments as evidenced by clinician review of documented attendance to scheduled appointments and patient/caregiver report -Patient/Caregiver will call pharmacy for medication refills as evidenced by patient report and review of pharmacy fill history as appropriate -Patient/Caregiver will call provider office for new concerns or questions as evidenced by review of documented incoming telephone call notes and patient report --Patient/Caregiver will focus on medication adherence by taking medications as prescribed  -Weigh daily and record (notify MD with 3 lb weight gain over night or 5 lb in a week) -Follow CHF Action Plan- do a check  daily -monitoring her food         SDOH assessments and interventions completed:  No     Care Coordination Interventions:  Yes, provided   Interventions Today    Flowsheet Row Most Recent Value  Chronic Disease   Chronic disease during today's visit Congestive Heart Failure (CHF)  General Interventions   General Interventions Discussed/Reviewed General Interventions Reviewed  Exercise Interventions   Exercise Discussed/Reviewed Exercise Reviewed  Education Interventions   Education Provided Provided Education  Pharmacy Interventions   Pharmacy Dicussed/Reviewed Pharmacy Topics Discussed  Safety Interventions   Safety Discussed/Reviewed Safety Reviewed        Follow up plan: Follow up call scheduled for 10/07/22  2 pm    Encounter Outcome:  Pt. Visit Completed   Juanell Fairly RN, BSN, [facility] Care Coordinator [facility]   Phone: [phone]

## 2022-09-06 NOTE — Telephone Encounter (Signed)
Patient would like a call from the nurse regarding her cpap supplies.  She needs her orders to go to two different suppliers because of cost issues.  Please call patient so that she can clarify which supplier gets which order.  CB# [phone]

## 2022-09-06 NOTE — Telephone Encounter (Signed)
Spoke to the patient and she stated that everyone is giving her the run around right now with getting an oxygen machine for her home. Patient states that she spoke with someone at [facility] and they told her that anyone of her providers could write the order for her. I advised the patient that Dr. Doreene Burke states that the order needs to come from her pulmonology provider. due to they are treating her for her breathing conditions. Patient understood and will call them to see if they can write the order for her. I advised the patient that if pulmonology will not write the order to let us know.

## 2022-09-08 ENCOUNTER — Ambulatory Visit (INDEPENDENT_AMBULATORY_CARE_PROVIDER_SITE_OTHER): Payer: HMO | Admitting: Psychology

## 2022-09-08 DIAGNOSIS — F330 Major depressive disorder, recurrent, mild: Secondary | ICD-10-CM | POA: Diagnosis not present

## 2022-09-08 DIAGNOSIS — F411 Generalized anxiety disorder: Secondary | ICD-10-CM

## 2022-09-09 NOTE — Telephone Encounter (Signed)
Spoke with patient.  New order placed for [facility].

## 2022-09-09 NOTE — Progress Notes (Signed)
 Behavioral Health Counselor/Therapist Progress Note  Patient ID: Stacey Page, MRN: [id number],    Date: 09/08/2022  Time spent:  60 minutes Time In:8:00 Time out 9:00  Treatment Type: Individual Therapy  Reported Symptoms: decreased motivation, feeling a little anxious, sadness  Mental Status Exam: Appearance:  Casual     Behavior: Appropriate  Motor: Normal  Speech/Language:  Pressured  Affect: Blunt  Mood: anxious  Thought process: normal  Thought content:   WNL  Sensory/Perceptual disturbances:   WNL  Orientation: oriented to person, place, time/date, and situation  Attention: Good  Concentration: Good  Memory: WNL  Fund of knowledge:  Good  Insight:   Good  Judgment:  Good  Impulse Control: Good   Risk Assessment: Danger to Self:  No Self-injurious Behavior: No Danger to Others: No Duty to Warn:no Physical Aggression / Violence:No  Access to Firearms a concern: No  Gang Involvement:No   Subjective: The patient attended an individual therapy session in the office today.  The patient presents with a blunted affect and mood is anxious.  The patient reports that she has been a little more anxious over the last week.  She states that she has had several things going on that she has had to deal with it has been more stressful for her.  She talked about having an issue with some of her durable medical equipment and having difficulty getting them to support those.  Specifically her oxygen.  The patient also talked about being frustrated by her daughter not taking responsibility in expecting her to loan her her car and not be responsible to provide herself with gas to get her back and forth to work.  We talked about these things and did some problem solving and talked about limit setting.  Interventions: Cognitive Behavioral Therapy, Assertiveness/Communication, and Insight-Oriented, problem solving  Diagnosis:Major depressive disorder, recurrent episode, mild  (HCC)  Generalized anxiety disorder  Plan: " I need someone to talk to" Supportive family, intelligent Objective Learn and implement problem-solving strategies for realistically addressing worries. Target Date: 04/21/2023 Frequency: Biweekly Progress: 10 Modality: individual Related Interventions 1. Teach the client problem-solving strategies involving specifically defining a problem,  generating options for addressing it, evaluating the pros and cons of each option, selecting and  implementing an optional action, and reevaluating and refining the action (or assign "Applying  Problem-Solving to Interpersonal Conflict" in the Adult Psychotherapy Homework Planner by  Stephannie Li). Objective Describe situations, thoughts, feelings, and actions associated with anxieties and worries, their impact  on functioning, and attempts to resolve them. Target Date: 04/21/2023 Frequency: Biweekly Progress: 10 Modality: individual Diagnosis  300.02 (Generalized anxiety disorder)  F33.0 (Major depressive affective disorder, recurrent episode, mild Conditions For Discharge Achievement of treatment goals and objectives  The patient approved this plan.    G , LCSW

## 2022-09-09 NOTE — Telephone Encounter (Signed)
Patient calling for a third time. Would like to hear something before the weekend if at all possible.

## 2022-09-11 ENCOUNTER — Other Ambulatory Visit: Payer: Self-pay | Admitting: Family Medicine

## 2022-09-11 ENCOUNTER — Other Ambulatory Visit (HOSPITAL_COMMUNITY): Payer: Self-pay

## 2022-09-12 ENCOUNTER — Other Ambulatory Visit: Payer: Self-pay

## 2022-09-12 MED ORDER — PANTOPRAZOLE SODIUM 40 MG PO TBEC
40.0000 mg | DELAYED_RELEASE_TABLET | Freq: Every day | ORAL | 1 refills | Status: DC
  Filled 2022-09-12: qty 90, 90d supply, fill #0
  Filled 2022-12-11: qty 90, 90d supply, fill #1

## 2022-09-15 ENCOUNTER — Encounter (INDEPENDENT_AMBULATORY_CARE_PROVIDER_SITE_OTHER): Payer: Self-pay | Admitting: Family Medicine

## 2022-09-15 ENCOUNTER — Ambulatory Visit (INDEPENDENT_AMBULATORY_CARE_PROVIDER_SITE_OTHER): Payer: HMO | Admitting: Adult Health

## 2022-09-15 ENCOUNTER — Telehealth (INDEPENDENT_AMBULATORY_CARE_PROVIDER_SITE_OTHER): Payer: HMO | Admitting: Family Medicine

## 2022-09-15 DIAGNOSIS — E1122 Type 2 diabetes mellitus with diabetic chronic kidney disease: Secondary | ICD-10-CM | POA: Diagnosis not present

## 2022-09-15 DIAGNOSIS — N184 Chronic kidney disease, stage 4 (severe): Secondary | ICD-10-CM | POA: Diagnosis not present

## 2022-09-15 DIAGNOSIS — Z6841 Body Mass Index (BMI) 40.0 and over, adult: Secondary | ICD-10-CM

## 2022-09-15 DIAGNOSIS — Z7984 Long term (current) use of oral hypoglycemic drugs: Secondary | ICD-10-CM

## 2022-09-15 DIAGNOSIS — G4733 Obstructive sleep apnea (adult) (pediatric): Secondary | ICD-10-CM

## 2022-09-15 NOTE — Progress Notes (Addendum)
TeleHealth Visit:  This visit was completed with telemedicine (audio/video) technology. Frantasia has verbally consented to this TeleHealth visit. The patient is located at home, the provider is located at home. The participants in this visit include the listed provider and patient. The visit was conducted today via phone call.  Unsuccessful attempt was made to connect to video. Length of call was 22 minutes.   OBESITY Bryauna is here to discuss her progress with her obesity treatment plan along with follow-up of her obesity related diagnoses.   Today's visit was # 50 Starting weight: 344 lbs Starting date: 03/25/19 Weight at last in office visit: 331 lbs on 08/15/22 Total weight loss: 13 lbs at last in office visit on 08/15/22. Today's reported weight (09/15/22): none reported  Nutrition Plan: the Category 2 plan   Current exercise:  She is doing chair exercised a few times per week. Walking 10 minutes 3 times per week   Interim History:  She feels uncomfortable with her "Belly weight".  She uses supplemental oxygen as needed during the day and all night with her CPAP. She has added in chair exercise a few time per week. She is concerned about her grandson who is overweight. Eating dinner later to avoid nighttime snacking.  Typical day of food: Breakfast: Cheerios/Fair Life milk and fruit Lunch: [place] sandwich with mayo on 45 calorie bread Dinner: [place] kielbasa (boiled), vegetable Snacks: fruit  She can only journal when her grandson is there to support her.  Assessment/Plan:  1. Type 2 Diabetes Mellitus with CKD stage 4, without long-term current use of insulin HgbA1c is at goal. Last A1c was 6.5. CBGs: Fasting 103, 133, 122, 124. Episodes of hypoglycemia: no Medication(s): Farxiga 5 mg daily.  Lab Results  Component Value Date   HGBA1C 6.5 07/14/2022   HGBA1C 6.5 04/21/2022   HGBA1C 6.3 (H) 11/12/2021   Lab Results  Component Value Date   MICROALBUR 1.4  05/14/2020   LDLCALC 73 01/14/2022   CREATININE 2.09 (H) 07/14/2022   Lab Results  Component Value Date   GFR 23.55 (L) 07/14/2022   GFR 25.82 (L) 02/22/2022   GFR 24.17 (L) 02/08/2022    Plan: Continue Farxiga. Continue to monitor fasting blood sugars, Avoid simple carbs.   2. Obstructive Sleep Apnea Enna has a diagnosis of sleep apnea. She reports that she is using a CPAP regularly. Can't sleep without it. Has supplemental O2 with it.  Plan: Continue CPAP therapy.  3. Morbid Obesity: Current BMI 50  Jeraldin is currently in the action stage of change. As such, her goal is to continue with weight loss efforts.  She has agreed to the Category 2 plan.  1.  Discussed in depth importance of adhering to plan very closely for weight loss. 2.  Urged her to only have foods in her home that are category 2 plan foods.  Exercise goals:  as is  Behavioral modification strategies: increasing lean protein intake, planning for success, and emotional eating strategies.  Blondina has agreed to follow-up with our clinic in 4 weeks.  No orders of the defined types were placed in this encounter.   There are no discontinued medications.   No orders of the defined types were placed in this encounter.     Objective:   VITALS: Per patient if applicable, see vitals. GENERAL: Alert and in no acute distress. CARDIOPULMONARY: No increased WOB. Speaking in clear sentences.  PSYCH: Pleasant and cooperative. Speech normal rate and rhythm. Affect is appropriate. Insight and judgement  are appropriate. Attention is focused, linear, and appropriate.  NEURO: Oriented as arrived to appointment on time with no prompting.   Attestation Statements:   Reviewed by clinician on day of visit: allergies, medications, problem list, medical history, surgical history, family history, social history, and previous encounter notes.  Time spent on visit including the items listed below was 30 minutes.   -preparing to see the patient (e.g., review of tests, history, previous notes) -obtaining and/or reviewing separately obtained history -counseling and educating the patient/family/caregiver -documenting clinical information in the electronic or other health record -ordering medications, tests, or procedures -independently interpreting results and communicating results to the patient/ family/caregiver -referring and communicating with other health care professionals  -care coordination   This was prepared with the assistance of Engineer, civil (consulting).  Occasional wrong-word or sound-a-like substitutions may have occurred due to the inherent limitations of voice recognition software.

## 2022-09-15 NOTE — Addendum Note (Signed)
Addended by: Jesse Sans on: 09/15/2022 04:19 PM   Modules accepted: Level of Service

## 2022-09-20 ENCOUNTER — Ambulatory Visit: Payer: HMO | Admitting: Psychology

## 2022-09-20 DIAGNOSIS — F411 Generalized anxiety disorder: Secondary | ICD-10-CM

## 2022-09-20 DIAGNOSIS — J9610 Chronic respiratory failure, unspecified whether with hypoxia or hypercapnia: Secondary | ICD-10-CM | POA: Diagnosis not present

## 2022-09-20 DIAGNOSIS — I272 Pulmonary hypertension, unspecified: Secondary | ICD-10-CM | POA: Diagnosis not present

## 2022-09-20 DIAGNOSIS — F330 Major depressive disorder, recurrent, mild: Secondary | ICD-10-CM

## 2022-09-20 DIAGNOSIS — I509 Heart failure, unspecified: Secondary | ICD-10-CM | POA: Diagnosis not present

## 2022-09-21 DIAGNOSIS — G4733 Obstructive sleep apnea (adult) (pediatric): Secondary | ICD-10-CM | POA: Diagnosis not present

## 2022-09-21 NOTE — Progress Notes (Signed)
[facility] Counselor/Therapist Progress Note  Patient ID: Stacey Page, MRN: [id number],    Date: 09/20/2022  Time spent:  60 minutes Time In:8:00 Time out 9:00  Treatment Type: Individual Therapy  Reported Symptoms: decreased motivation, feeling a little anxious, sadness  Mental Status Exam: Appearance:  Casual     Behavior: Appropriate  Motor: Normal  Speech/Language:  Pressured  Affect: Blunt  Mood: anxious  Thought process: normal  Thought content:   WNL  Sensory/Perceptual disturbances:   WNL  Orientation: oriented to person, place, time/date, and situation  Attention: Good  Concentration: Good  Memory: WNL  Fund of knowledge:  Good  Insight:   Good  Judgment:  Good  Impulse Control: Good   Risk Assessment: Danger to Self:  No Self-injurious Behavior: No Danger to Others: No Duty to Warn:no Physical Aggression / Violence:No  Access to Firearms a concern: No  Gang Involvement:No   Subjective: The patient attended an individual therapy session in the office today.  The patient presents with a blunted affect and mood is anxious.  The patient states that she is going to take a break from therapy for a few weeks.  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  She stated that she was transferred to [facility].  We did some problem solving and talked about how she is going to handle it.  I recommended that she contact the [place] office to make a complaint.  She also plans on contacting [employer].   Interventions: Cognitive Behavioral Therapy, Assertiveness/Communication, and Insight-Oriented, problem solving  Diagnosis:Major depressive disorder, recurrent episode, mild (HCC)  Generalized anxiety disorder  Plan: " I need someone to talk to" Supportive family, intelligent Objective Learn and implement problem-solving strategies for realistically addressing worries. Target Date: 04/21/2023 Frequency: Biweekly Progress: 20  Modality: individual Related Interventions 1. Teach the client problem-solving strategies involving specifically defining a problem,  generating options for addressing it, evaluating the pros and cons of each option, selecting and  implementing an optional action, and reevaluating and refining the action (or assign "Applying  Problem-Solving to Interpersonal Conflict" in the Adult Psychotherapy Homework Planner by  Stephannie Li). Objective Describe situations, thoughts, feelings, and actions associated with anxieties and worries, their impact  on functioning, and attempts to resolve them. Target Date: 04/21/2023 Frequency: Biweekly Progress: 10 Modality: individual Diagnosis  300.02 (Generalized anxiety disorder)  F33.0 (Major depressive affective disorder, recurrent episode, mild Conditions For Discharge Achievement of treatment goals and objectives  The patient approved this plan.    G , LCSW

## 2022-09-23 DIAGNOSIS — N2581 Secondary hyperparathyroidism of renal origin: Secondary | ICD-10-CM | POA: Diagnosis not present

## 2022-09-23 DIAGNOSIS — I5032 Chronic diastolic (congestive) heart failure: Secondary | ICD-10-CM | POA: Diagnosis not present

## 2022-09-23 DIAGNOSIS — N184 Chronic kidney disease, stage 4 (severe): Secondary | ICD-10-CM | POA: Diagnosis not present

## 2022-09-23 DIAGNOSIS — E1122 Type 2 diabetes mellitus with diabetic chronic kidney disease: Secondary | ICD-10-CM | POA: Diagnosis not present

## 2022-09-23 DIAGNOSIS — D631 Anemia in chronic kidney disease: Secondary | ICD-10-CM | POA: Diagnosis not present

## 2022-09-23 DIAGNOSIS — I129 Hypertensive chronic kidney disease with stage 1 through stage 4 chronic kidney disease, or unspecified chronic kidney disease: Secondary | ICD-10-CM | POA: Diagnosis not present

## 2022-09-24 LAB — LAB REPORT - SCANNED
Creatinine, POC: 28.8 mg/dL
EGFR: 25

## 2022-09-28 ENCOUNTER — Telehealth: Payer: Self-pay | Admitting: Pulmonary Disease

## 2022-09-28 ENCOUNTER — Encounter: Payer: Self-pay | Admitting: Registered Nurse

## 2022-09-28 DIAGNOSIS — J9602 Acute respiratory failure with hypercapnia: Secondary | ICD-10-CM

## 2022-09-28 DIAGNOSIS — G4733 Obstructive sleep apnea (adult) (pediatric): Secondary | ICD-10-CM

## 2022-09-28 NOTE — Telephone Encounter (Signed)
Pt states [employer] rcvd the order but it was unsigned

## 2022-09-29 NOTE — Telephone Encounter (Signed)
Need an order to DC oxygen through [facility]

## 2022-09-29 NOTE — Telephone Encounter (Signed)
Message sent to [employer].

## 2022-09-29 NOTE — Telephone Encounter (Signed)
Pt states they have rcvd the order from [facility] not [facility], the order has not been signed by Dr. Craige Cotta. From 01/2022 to now the O2 she currently have has not been covered by her new insurance [employer].  Pt needs [facility] to pick up the O2 that she has now and  needs order signed for current order with the new insurance.

## 2022-09-30 NOTE — Telephone Encounter (Signed)
I just spoke with this patient, but I'm really confused. Patient doesn't want to switch to [facility]. She wants to stay with [facility] but it stating that there is an order that needs to be signed by Dr. Craige Cotta

## 2022-09-30 NOTE — Telephone Encounter (Signed)
All orders are signed they put the last order in under [place]

## 2022-10-03 ENCOUNTER — Ambulatory Visit
Admission: RE | Admit: 2022-10-03 | Discharge: 2022-10-03 | Disposition: A | Discharge status: Home / Self Care | Payer: HMO | Source: Ambulatory Visit | Attending: Family Medicine | Admitting: Family Medicine

## 2022-10-03 DIAGNOSIS — Z0000 Encounter for general adult medical examination without abnormal findings: Secondary | ICD-10-CM

## 2022-10-03 DIAGNOSIS — Z1231 Encounter for screening mammogram for malignant neoplasm of breast: Secondary | ICD-10-CM | POA: Diagnosis not present

## 2022-10-05 DIAGNOSIS — L600 Ingrowing nail: Secondary | ICD-10-CM | POA: Diagnosis not present

## 2022-10-05 DIAGNOSIS — I739 Peripheral vascular disease, unspecified: Secondary | ICD-10-CM | POA: Diagnosis not present

## 2022-10-05 DIAGNOSIS — E1142 Type 2 diabetes mellitus with diabetic polyneuropathy: Secondary | ICD-10-CM | POA: Diagnosis not present

## 2022-10-05 DIAGNOSIS — M792 Neuralgia and neuritis, unspecified: Secondary | ICD-10-CM | POA: Diagnosis not present

## 2022-10-05 DIAGNOSIS — B351 Tinea unguium: Secondary | ICD-10-CM | POA: Diagnosis not present

## 2022-10-05 DIAGNOSIS — M2041 Other hammer toe(s) (acquired), right foot: Secondary | ICD-10-CM | POA: Diagnosis not present

## 2022-10-05 NOTE — Telephone Encounter (Signed)
[facility] X1 LVM for patient. I'm not entirely sure what patient is needing. Reading through the chart she gets cpap from [facility] and she is a current patient with [facility] for oxygen

## 2022-10-05 NOTE — Telephone Encounter (Signed)
Patient calling again. She is very upset and confused. She is needing her 02 and her tanks sent to [facility]. Please advise. Fax: [phone]

## 2022-10-06 ENCOUNTER — Ambulatory Visit: Payer: HMO | Admitting: Psychology

## 2022-10-06 DIAGNOSIS — G4733 Obstructive sleep apnea (adult) (pediatric): Secondary | ICD-10-CM | POA: Diagnosis not present

## 2022-10-06 NOTE — Telephone Encounter (Signed)
Called patient.  Patient states she has new insurance and [facility] told her she needs all new orders for her oxygen needs at home.  Also, patient needs order to [facility] to get a chin strap for her CPAP.  This is noted in her OV with Dr. Craige Cotta from 08/31/2022.  Spoke with Nida Boatman New at [employer].  He is checking with Elease Hashimoto that handles the oxygen orders.  He will see if patient will need an OV with Dr. Craige Cotta and a new qualifying walk to get her oxygen thru her new insurance company. Per Nida Boatman, he thinks all they need is a new order with OV notes (and maybe a new qualifying walk documented).  Just need to order a chin strap thru [facility] for second request.  Will relay information to [facility] team.

## 2022-10-06 NOTE — Telephone Encounter (Signed)
New orders were placed and sent

## 2022-10-06 NOTE — Telephone Encounter (Signed)
I do not see any orders that are recent for oxygen I only see Cpap orders someone needs to call patient and figure out what she is needing

## 2022-10-07 ENCOUNTER — Ambulatory Visit: Payer: Self-pay

## 2022-10-07 NOTE — Telephone Encounter (Signed)
Called patient.  [facility] and [employer] notified patient yesterday orders were received and patient is scheduled for oxygen and chin strap for CPAP.  Patient stated she is scheduled for both services.  Nothing further needed at this time.

## 2022-10-08 NOTE — Patient Instructions (Signed)
Visit Information  Thank you for taking time to visit with me today. Please don't hesitate to contact me if I can be of assistance to you.   Following are the goals we discussed today:   Goals Addressed             This Visit's Progress    I want to monitor and manage my CHF       Patient Goals/Self Care Activities: -Patient/Caregiver will take medications as prescribed   -Patient/Caregiver will attend all scheduled provider appointments -Patient/Caregiver will call pharmacy for medication refills 3-7 days in advance of running out of medications -Patient/Caregiver will call provider office for new concerns or questions  -Patient/Caregiver will focus on medication adherence by taking medications as prescribed   -Weigh daily and record (notify MD with 3 lb weight gain over night or 5 lb in a week) -Follow CHF Action Plan- do a check daily -monitoring her food         Our next appointment is by telephone on 11/10/22 at 130 pm  Please call the care guide team at [phone] if you need to cancel or reschedule your appointment.   If you are experiencing a Mental Health or Behavioral Health Crisis or need someone to talk to, please call 1-800-273-TALK (toll free, 24 hour hotline)  Patient verbalizes understanding of instructions and care plan provided today and agrees to view in MyChart. Active MyChart status and patient understanding of how to access instructions and care plan via MyChart confirmed with patient.     Juanell Fairly RN, BSN, [facility]er/designer Phone: [phone]

## 2022-10-08 NOTE — Patient Outreach (Signed)
  Care Coordination   Follow Up Visit Note   10/07/2022 Name: Stacey Page MRN: [id number] DOB: April 18, 1950  Stacey Page is a 72 y.o. year old female who sees Stacey Sax, MD for primary care. I spoke with  Stacey Page by phone today.  What matters to the patients health and wellness today?  Stacey Page is currently in good health. She encountered issues with her oxygen supply and the corresponding company, which have been addressed. She has not reported any symptoms of swelling or chest pain and is taking her medications as prescribed.    Goals Addressed             This Visit's Progress    I want to monitor and manage my CHF       Patient Goals/Self Care Activities: -Patient/Caregiver will take medications as prescribed   -Patient/Caregiver will attend all scheduled provider appointments -Patient/Caregiver will call pharmacy for medication refills 3-7 days in advance of running out of medications -Patient/Caregiver will call provider office for new concerns or questions  -Patient/Caregiver will focus on medication adherence by taking medications as prescribed   -Weigh daily and record (notify MD with 3 lb weight gain over night or 5 lb in a week) -Follow CHF Action Plan- do a check daily -monitoring her food         SDOH assessments and interventions completed:  No     Care Coordination Interventions:  Yes, provided   Interventions Today    Flowsheet Row Most Recent Value  Chronic Disease   Chronic disease during today's visit Congestive Heart Failure (CHF)  General Interventions   General Interventions Discussed/Reviewed General Interventions Discussed  Pharmacy Interventions   Pharmacy Dicussed/Reviewed Pharmacy Topics Discussed  Safety Interventions   Safety Discussed/Reviewed Safety Reviewed        Follow up plan: Follow up call scheduled for 11/10/22  130 pm    Encounter Outcome:  Pt. Visit Completed   Stacey Fairly RN, BSN,  [facility]   Care Coordinator Phone: [phone]

## 2022-10-13 ENCOUNTER — Ambulatory Visit (INDEPENDENT_AMBULATORY_CARE_PROVIDER_SITE_OTHER): Payer: HMO | Admitting: Adult Health

## 2022-10-13 ENCOUNTER — Encounter (INDEPENDENT_AMBULATORY_CARE_PROVIDER_SITE_OTHER): Payer: Self-pay | Admitting: Adult Health

## 2022-10-13 ENCOUNTER — Other Ambulatory Visit: Payer: Self-pay

## 2022-10-13 VITALS — BP 138/76 | HR 60 | Ht 68.0 in | Wt 331.0 lb

## 2022-10-13 DIAGNOSIS — Z7984 Long term (current) use of oral hypoglycemic drugs: Secondary | ICD-10-CM

## 2022-10-13 DIAGNOSIS — N184 Chronic kidney disease, stage 4 (severe): Secondary | ICD-10-CM

## 2022-10-13 DIAGNOSIS — E1159 Type 2 diabetes mellitus with other circulatory complications: Secondary | ICD-10-CM | POA: Diagnosis not present

## 2022-10-13 DIAGNOSIS — E669 Obesity, unspecified: Secondary | ICD-10-CM

## 2022-10-13 DIAGNOSIS — E1122 Type 2 diabetes mellitus with diabetic chronic kidney disease: Secondary | ICD-10-CM | POA: Diagnosis not present

## 2022-10-13 DIAGNOSIS — I152 Hypertension secondary to endocrine disorders: Secondary | ICD-10-CM | POA: Diagnosis not present

## 2022-10-13 DIAGNOSIS — Z6841 Body Mass Index (BMI) 40.0 and over, adult: Secondary | ICD-10-CM

## 2022-10-13 MED ORDER — SEMAGLUTIDE(0.25 OR 0.5MG/DOS) 2 MG/3ML ~~LOC~~ SOPN
0.2500 mg | PEN_INJECTOR | SUBCUTANEOUS | 0 refills | Status: AC
Start: 2022-10-13 — End: ?
  Filled 2022-10-13 – 2022-10-19 (×3): qty 3, 56d supply, fill #0
  Filled 2022-10-19: qty 3, 28d supply, fill #0

## 2022-10-13 NOTE — Progress Notes (Signed)
WEIGHT SUMMARY AND BIOMETRICS  Vitals Temp: 0 F (-17.8 C) (could not get temp.) BP: 138/76 Pulse Rate: 60 SpO2: 90 %   Anthropometric Measurements Height: 5\' 8"  (1.727 m) Weight: (!) 331 lb (150.1 kg) BMI (Calculated): 50.34 Weight at Last Visit: 331lb Weight Lost Since Last Visit: 0 Weight Gained Since Last Visit: 0 Starting Weight: 344lb Total Weight Loss (lbs): 13 lb (5.897 kg)   Body Composition  Body Fat %: 56 % Fat Mass (lbs): 185.4 lbs Muscle Mass (lbs): 138.2 lbs Total Body Water (lbs): 107.8 lbs Visceral Fat Rating : 23   Other Clinical Data Fasting: no Labs: no Today's Visit #: 51 Starting Date: 03/25/19    Chief Complaint:   OBESITY Stacey Page is here to discuss her progress with her obesity treatment plan. She is on the the Category 2 Plan and states she is following her eating plan approximately 50 % of the time. She states she is exercising Chair Exercises and Walking 5-10 minutes 3 times per week.   Interim History:  [name] is quite frustrated with her weight loss plateau.  Hunger/appetite-she endorses intermittent polyphagia  Exercise-Chair Exercise and Walking- limited by required supplemental O2  Reviewed Cards note from Dr. Harding/03/22/2022 Overall, she needs to lose weight.  Would strongly consider adding GLP-1 agonist for diabetic control and weight loss.     We discussed at length the risks/benefits of Ozempic therapy- she is agreeable to start and to closely monitor home CBG  Subjective:   1. Hypertension associated with diabetes (HCC) Per pt- Home readings SBP 130-160s DBP 60-80s She denise CP with exertion. She denies tobacco/vape use She is on supplemental Oxygen She is on aspirin 81 MG tablet  atorvastatin (LIPITOR) 20 MG tablet  losartan (COZAAR) 25 MG tablet  isosorbide dinitrate (ISORDIL) 10 MG tablet  carvedilol (COREG) 25 MG tablet  furosemide (LASIX) 80 MG tablet  hydrALAZINE (APRESOLINE) 50 MG  tablet  losartan (COZAAR) 25 MG tablet   2. Type 2 diabetes mellitus with stage 4 chronic kidney disease, without long-term current use of insulin (HCC) Lab Results  Component Value Date   HGBA1C 6.5 07/14/2022   HGBA1C 6.5 04/21/2022   HGBA1C 6.3 (H) 11/12/2021    She is currently on Farxiga 5mg - managed by PCP/Dr. Doreene Burke Home fasting CBG <130s consistently. Episodes of hypoglycemia: no  She denies family hx of MEN 2 or MTC She denies personal hx of pancreatitis. She denies any ETOH use  Reviewed OV from PCP/Dr. Farris Has 04/21/2022 For follow-up of above. Just saw nephrology. They are recommending decreasing the Farxiga to 5 mg daily. They have added losartan 25 mg. Renal function has been stable. Patient requesting refills on Flonase and Singulair. She is taking these medications for years for chronic allergy rhinitis. No issues with the Singulair. Continues follow-up with behavioral health and PMD for talking therapy.   Reviewed Cards note from Dr. Harding/03/22/2022 Overall, she needs to lose weight.  Would strongly consider adding GLP-1 agonist for diabetic control and weight loss.     We discussed at length the risks/benefits of Ozempic therapy- she is agreeable to start and to closely monitor home CBG  Assessment/Plan:   1. Hypertension associated with diabetes (HCC) Limit Na+ intake Increase exercise as tolerated. Continue to monitor home BP levels.  2. Type 2 diabetes mellitus with stage 4 chronic kidney disease, without long-term current use of insulin (HCC) Start  Semaglutide,0.25 or 0.5MG /DOS, 2 MG/3ML SOPN Inject 0.25 mg into the skin once  a week. Dispense: 3 mL, Refills: 0000 ordered   Continue daily Farxiga 5mg   CLOSELY monitor home CBG- if any sx's of hypoglycemia develop- contact [facility]   4. Current BMI 50.34  Stacey Page is currently in the action stage of change. As such, her goal is to continue with weight loss efforts. She has agreed to the Category 2 Plan.    Exercise goals: Older adults should follow the adult guidelines. When older adults cannot meet the adult guidelines, they should be as physically active as their abilities and conditions will allow.   Behavioral modification strategies: increasing lean protein intake, decreasing simple carbohydrates, increasing vegetables, increasing water intake, meal planning and cooking strategies, keeping healthy foods in the home, and planning for success.  [name] has agreed to follow-up with our clinic in 4 weeks. She was informed of the importance of frequent follow-up visits to maximize her success with intensive lifestyle modifications for her multiple health conditions.   Objective:   Blood pressure 138/76, pulse 60, height 5\' 8"  (1.727 m), weight (!) 331 lb (150.1 kg), SpO2 90%. Body mass index is 50.33 kg/m.  General: Cooperative, alert, well developed, in no acute distress. HEENT: Conjunctivae and lids unremarkable. Cardiovascular: Regular rhythm.  Lungs: Normal work of breathing. Neurologic: No focal deficits.   Lab Results  Component Value Date   CREATININE 2.09 (H) 07/14/2022   BUN 26 (H) 07/14/2022   NA 141 07/14/2022   K 4.2 07/14/2022   CL 100 07/14/2022   CO2 33 (H) 07/14/2022   Lab Results  Component Value Date   ALT 9 01/25/2022   AST 17 01/25/2022   ALKPHOS 65 01/25/2022   BILITOT 0.4 01/25/2022   Lab Results  Component Value Date   HGBA1C 6.5 07/14/2022   HGBA1C 6.5 04/21/2022   HGBA1C 6.3 (H) 11/12/2021   HGBA1C 6.0 (A) 07/14/2021   HGBA1C 6.4 (A) 04/14/2021   Lab Results  Component Value Date   INSULIN 8.6 10/28/2019   INSULIN 9.2 07/04/2019   INSULIN 8.8 03/25/2019   Lab Results  Component Value Date   TSH 0.51 11/28/2018   Lab Results  Component Value Date   CHOL 144 01/14/2022   HDL 57.10 01/14/2022   LDLCALC 73 01/14/2022   TRIG 66.0 01/14/2022   CHOLHDL 3 01/14/2022   Lab Results  Component Value Date   VD25OH 57.6 10/28/2019    VD25OH 41.5 07/04/2019   VD25OH 38.2 03/25/2019   Lab Results  Component Value Date   WBC 9.5 02/01/2022   HGB 12.3 02/01/2022   HCT 38.3 02/01/2022   MCV 90.1 02/01/2022   PLT 287 02/01/2022   Lab Results  Component Value Date   IRON 29 01/25/2022   TIBC 260 01/25/2022   FERRITIN 51 01/25/2022    Attestation Statements:   Reviewed by clinician on day of visit: allergies, medications, problem list, medical history, surgical history, family history, social history, and previous encounter notes.  I have reviewed the above documentation for accuracy and completeness, and I agree with the above. -  Donnajean Chesnut d. [name], NP-C

## 2022-10-14 ENCOUNTER — Other Ambulatory Visit (HOSPITAL_COMMUNITY): Payer: Self-pay

## 2022-10-16 ENCOUNTER — Other Ambulatory Visit (HOSPITAL_COMMUNITY): Payer: Self-pay

## 2022-10-17 ENCOUNTER — Encounter: Payer: Self-pay | Admitting: Cardiology

## 2022-10-17 ENCOUNTER — Ambulatory Visit: Payer: HMO | Attending: Cardiology | Admitting: Cardiology

## 2022-10-17 VITALS — BP 126/70 | HR 59 | Ht 68.0 in | Wt 336.2 lb

## 2022-10-17 DIAGNOSIS — I5032 Chronic diastolic (congestive) heart failure: Secondary | ICD-10-CM | POA: Diagnosis not present

## 2022-10-17 DIAGNOSIS — I110 Hypertensive heart disease with heart failure: Secondary | ICD-10-CM

## 2022-10-17 DIAGNOSIS — G4733 Obstructive sleep apnea (adult) (pediatric): Secondary | ICD-10-CM | POA: Diagnosis not present

## 2022-10-17 DIAGNOSIS — Z6841 Body Mass Index (BMI) 40.0 and over, adult: Secondary | ICD-10-CM | POA: Diagnosis not present

## 2022-10-17 DIAGNOSIS — N184 Chronic kidney disease, stage 4 (severe): Secondary | ICD-10-CM | POA: Diagnosis not present

## 2022-10-17 DIAGNOSIS — J849 Interstitial pulmonary disease, unspecified: Secondary | ICD-10-CM

## 2022-10-17 DIAGNOSIS — I350 Nonrheumatic aortic (valve) stenosis: Secondary | ICD-10-CM

## 2022-10-17 DIAGNOSIS — E785 Hyperlipidemia, unspecified: Secondary | ICD-10-CM

## 2022-10-17 DIAGNOSIS — I272 Pulmonary hypertension, unspecified: Secondary | ICD-10-CM | POA: Diagnosis not present

## 2022-10-17 DIAGNOSIS — I10 Essential (primary) hypertension: Secondary | ICD-10-CM | POA: Diagnosis not present

## 2022-10-17 DIAGNOSIS — E1169 Type 2 diabetes mellitus with other specified complication: Secondary | ICD-10-CM

## 2022-10-17 NOTE — Progress Notes (Signed)
Cardiology Office Note:  .   Date:  10/23/2022  ID:  Stacey Page, DOB Oct 26, 1950, MRN [id number] PCP: Mliss Sax, MD  [facility] Providers Cardiologist:  Bryan Lemma, MD Cardiology APP:  Marcelino Duster, [place]     Nephrologist: Dr. Malen Gauze Pulmonologist: Coralyn Helling, MD (Rubye Oaks, NP) -> will need to be reassigned to new MD.   Chief Complaint  Patient presents with   Follow-up    58-month follow-up.  Doing well.   Congestive Heart Failure    Combination of HFpEF, WHO class III CHF from OHS/OSA with underlying ILD.  Stable dyspnea.  Needs new pulmonary medicine provider, but is currently seeing Jeanmarie Plant, NP.    History of Present Illness: Stacey Page is a super morbidly obese 72 y.o. female with a PMH summarized below who presents here for ~6 month f/u at the request of Mliss Sax,*.  PMH : Moderate AS,  Hypertensive Heart Disease With Chronic HFpEF superimposed on WHO Class III Pulmonary Hypertension,  Hospitalized in September 2023 for acute on chronic heart failure due to medication noncompliance DM-2,  Reactive Airway Disease ILD,  HLD, CKD 3/4 ,  OSA on CPAP-OHS,  Aoibheann Heesch was last seen on March 22, 2022-doing well.  BP well-controlled.  Weight stable.  Exertional dyspnea but notably improved.  Try to get out and do more.  Doing more walking and exercise.  No chest pain or pressure.  Trivial swelling.  Using CPAP and nighttime oxygen.  Rare off-and-on chest imaging but stable.  Energy level improving.  No changes made.     Subjective  INTERVAL HISTORY Lashunna Dazzo returns today overall doing pretty well.  Biggest issue is that she just has poor sleep hygiene.  She sleeps a lot during the day and does not sleep much at night.  As such she is not as active.  She is very happy that has been almost a year since her hospitalization and is remaining quite stable.  She uses oxygen but not necessarily at  rest.  When she is walking or for sleeping she is sure to use it.  She still has exertional dyspnea but no real change.  Trying to stay active as she was before.  No chest pain or pressure with rest or exertion.  Edema pretty well-controlled with no PND or orthopnea.  She is concerned that her pulmonologist is moving and asked for new follow-up doctors and I suggested a couple names with [place] [facility].  Also, her PCP was asking about being approved for Ozempic.  As far as I am concerned, it would be fine but need to clear with nephrology.  Cardiovascular ROS: positive for - dyspnea on exertion and well-controlled swelling.  Baseline dyspnea poor sleep habits. negative for - chest pain, edema, irregular heartbeat, orthopnea, palpitations, paroxysmal nocturnal dyspnea, rapid heart rate, or syncope or near syncope, TIA or amaurosis fugax, claudication   ROS:  Review of Systems - Negative except musculoskeletal pains from being overweight and having knee pain.  Poor sleep.  Sleeps a lot during the day.     Objective   Studies Reviewed: Marland Kitchen   EKG Interpretation Date/Time:  Monday October 17 2022 15:56:08 EDT Ventricular Rate:  56 PR Interval:  204 QRS Duration:  92 QT Interval:  482 QTC Calculation: 465 R Axis:   99  Text Interpretation: Sinus bradycardia Rightward axis Cannot rule out Anterior infarct , age undetermined When compared with ECG of 01-Feb-2022  12:52, Questionable change in QRS axis Confirmed by Bryan Lemma ([id number]) on 10/17/2022 3:58:14 PM   No new studies  Old studies: CPX 06/21/2021: Resting spirometry showing restrictive physiology.  Peak VO2 suggest mainly limiting factor is weight for exercise limitation.  Also noted significant chronotropic incompetence.  == Hospitalized for heart failure ECHO 10/09/2021: Hyperdynamic LV with a EF 70 to 75%.  No RWMA.  Moderate LVH.  With moderate LA dilation.  Moderately elevated PAP.,  With RAP estimated 15 mmHg.  Moderate  MAC but no significant MR or MS.  AOV thickening with moderate AAS (mean AVG 23 mmHg) Right Heart Cath 10/15/2021: Mean PAP 28 mmHg with PCWP 13 mmHg.  WHO class III Pulmonary Hypertension.  RAP 6 mmHg.  == High resolution chest CT: Mild pulmonary fibrosis pattern without clear apical basilar gradient relative heterogeneous asymmetric distribution of berry bronchovascular and subpleural groundglass ,. . . Findings are not appreciably changed in comparison to prior examination dated 03/20/2014, however there has been interval RUL wedge biopsy. Correlate with biopsy findings.  Lobular air trapping on expiratory phase imaging, which may be a feature of fibrosis or reflect small airways disease.     Risk Assessment/Calculations:    Physical Exam:   VS:  BP 126/70 (BP Location: Left Arm, Patient Position: Sitting, Cuff Size: Normal)   Pulse (!) 59   Ht 5\' 8"  (1.727 m)   Wt (!) 336 lb 3.2 oz (152.5 kg)   SpO2 95%   BMI 51.12 kg/m    Wt Readings from Last 3 Encounters:  10/17/22 (!) 336 lb 3.2 oz (152.5 kg)  10/13/22 (!) 331 lb (150.1 kg)  08/31/22 (!) 332 lb 9.6 oz (150.9 kg)    GEN: Well-groomed  ; super-morbidly obese, on O2, but otherwise healthy looking. No acute distress NECK: No JVD; No carotid bruits CARDIAC: RRR, distant heart sounds but with normal S1, S2 and S4 gallop noted.  2/6 SEM at RUSB-neck.  No rubs. RESPIRATORY:  Clear to auscultation without rales, wheezing or rhonchi ; nonlabored, good air movement. ABDOMEN: Soft, non-tender, non-distended EXTREMITIES: Unable to assess edema; No deformity   Chem Panel from Nephrologist: 09/28/22: NA 140, K4.1, CL 100, CO2 32, BUN 26, Cr 2.09, GLU 105, Ca 9.7. 07/14/2022: A1c 6.5. Last lipid panel 01/14/2022: TC 144, TG 66, HDL 57, LDL 73.    ASSESSMENT AND PLAN: .    Problem List Items Addressed This Visit       Cardiology Problems   CHF (congestive heart failure) (HCC)   Relevant Orders   EKG 12-Lead (Completed)   ECHOCARDIOGRAM  COMPLETE   Essential hypertension - Primary (Chronic)    Stable.  See above      Relevant Orders   EKG 12-Lead (Completed)   Hyperlipidemia associated with type 2 diabetes mellitus (HCC) (Chronic)    Due for lipids to be rechecked in December.  Pretty well-controlled at last visit.  Remains on moderate dose atorvastatin.  A1c was 6.5 back in June.  Is on Farxiga, and PCP was asking about potentially starting semaglutide.  (Ozempic).  As long as this is cleared by nephrology, I am fine with that.      Hypertensive heart disease with chronic diastolic congestive heart failure (HCC) (Chronic)    Blood pressures look much better controlled today than they have in the past.  As long as she is adherent to her medications, she is doing well.  She remains on very low-dose losartan-dose reduced by nephrology. Is on high-dose carvedilol  On Farxiga-reduced dose by nephrology. On 80 mg daily Lasix and not requiring additional dosing. Remains on Isordil 10 mg 3 times daily and hydralazine 50 mg 3 times daily until we can be certain to titrate losartan. Continue CPAP-with oxygen at night and oxygen during the day for activity.       Mild pulmonary hypertension (HCC) (Chronic)    Likely related to ILD, obesity, OHS and OSA with some component of HFpEF.  On Farxiga and furosemide.  On CPAP plus oxygen overnight and oxygen with activity      Relevant Orders   ECHOCARDIOGRAM COMPLETE   Moderate aortic stenosis by prior echocardiogram (Chronic)    Moderate by echo last year.  Due for follow-up echo in September.  Depending on what it looks like maybe reassess every other year is stable.      Relevant Orders   ECHOCARDIOGRAM COMPLETE     Other   CKD (chronic kidney disease) stage 4, GFR 15-29 ml/min (HCC) (Chronic)    CKD 3-4 depending on when labs are checked.  Now following up with nephrology.  They have adjusted her Farxiga dose as well as her ARB dose would also defer to nephrology as far  as potentially using GLP-1 agonist. Nephrologist is Dr. Malen Gauze.      Class 3 severe obesity with serious comorbidity and body mass index (BMI) of 50.0 to 59.9 in adult [facility]) (Chronic)    Needs to lose weight.  I agree at this [place] without would be helpful, especially in setting of heart failure and diabetes.  Can use semaglutide at low-dose but also could consider increasing to [facility] for obesity indication.      ILD (interstitial lung disease) 2/2 MAI s/p med rxn (Chronic)   OSA (obstructive sleep apnea) (Chronic)            Dispo: Return in about 6 months (around 04/16/2023) for Alternate 6 month follow-up with APP & MD.  Total time spent: 19 min spent with patient + 14 min spent charting = 33 min     Signed, Marykay Lex, MD, MS Bryan Lemma, M.D., M.S. Interventional Cardiologist  [facility]  Pager # [phone] Phone # [phone] [address] [place], [place] [address]

## 2022-10-17 NOTE — Patient Instructions (Signed)
Medication Instructions:    From a cardiac standpoint  it is okay to use Ozempic , Dr Herbie Baltimore would like for you to discuss with your Nephrologist    *If you need a refill on your cardiac medications before your next appointment, please call your pharmacy*   Lab Work: Not needed    Testing/Procedures: Will be schedule at [facility] [address] Your physician has requested that you have an echocardiogram. Echocardiography is a painless test that uses sound waves to create images of your heart. It provides your doctor with information about the size and shape of your heart and how well your heart's chambers and valves are working. This procedure takes approximately one hour. There are no restrictions for this procedure. Please do NOT wear cologne, perfume, aftershave, or lotions (deodorant is allowed). Please arrive 15 minutes prior to your appointment time.    Follow-Up: At [facility], you and your health needs are our priority.  As part of our continuing mission to provide you with exceptional heart care, we have created designated Provider Care Teams.  These Care Teams include your primary Cardiologist (physician) and Advanced Practice Providers (APPs -  Physician Assistants and Nurse Practitioners) who all work together to provide you with the care you need, when you need it.     Your next appointment:   6 month(s)  The format for your next appointment:   In Person  Provider:     6 month  Bernadene Person NP, or Joni Reining NP and Bryan Lemma, MD in 12 months   Other Instructions

## 2022-10-18 ENCOUNTER — Other Ambulatory Visit (HOSPITAL_COMMUNITY): Payer: Self-pay

## 2022-10-19 ENCOUNTER — Telehealth (INDEPENDENT_AMBULATORY_CARE_PROVIDER_SITE_OTHER): Payer: Self-pay

## 2022-10-19 ENCOUNTER — Encounter (INDEPENDENT_AMBULATORY_CARE_PROVIDER_SITE_OTHER): Payer: Self-pay | Admitting: Adult Health

## 2022-10-19 ENCOUNTER — Other Ambulatory Visit (HOSPITAL_COMMUNITY): Payer: Self-pay

## 2022-10-19 NOTE — Telephone Encounter (Signed)
PA for Ozempic was submitted and approved until 10/18/2023. Patient has been notified.

## 2022-10-20 ENCOUNTER — Other Ambulatory Visit: Payer: Self-pay

## 2022-10-20 ENCOUNTER — Institutional Professional Consult (permissible substitution): Payer: HMO | Admitting: Plastic Surgery

## 2022-10-20 ENCOUNTER — Other Ambulatory Visit (HOSPITAL_COMMUNITY): Payer: Self-pay

## 2022-10-21 DIAGNOSIS — J9610 Chronic respiratory failure, unspecified whether with hypoxia or hypercapnia: Secondary | ICD-10-CM | POA: Diagnosis not present

## 2022-10-21 DIAGNOSIS — I272 Pulmonary hypertension, unspecified: Secondary | ICD-10-CM | POA: Diagnosis not present

## 2022-10-21 DIAGNOSIS — I509 Heart failure, unspecified: Secondary | ICD-10-CM | POA: Diagnosis not present

## 2022-10-23 ENCOUNTER — Encounter: Payer: Self-pay | Admitting: Cardiology

## 2022-10-23 NOTE — Assessment & Plan Note (Signed)
Likely related to ILD, obesity, OHS and OSA with some component of HFpEF.  On Farxiga and furosemide.  On CPAP plus oxygen overnight and oxygen with activity

## 2022-10-23 NOTE — Assessment & Plan Note (Signed)
Moderate by echo last year.  Due for follow-up echo in September.  Depending on what it looks like maybe reassess every other year is stable.

## 2022-10-23 NOTE — Assessment & Plan Note (Addendum)
Due for lipids to be rechecked in December.  Pretty well-controlled at last visit.  Remains on moderate dose atorvastatin.  A1c was 6.5 back in June.  Is on Farxiga, and PCP was asking about potentially starting semaglutide.  (Ozempic).  As long as this is cleared by nephrology, I am fine with that.

## 2022-10-23 NOTE — Assessment & Plan Note (Addendum)
CKD 3-4 depending on when labs are checked.  Now following up with nephrology.  They have adjusted her Farxiga dose as well as her ARB dose would also defer to nephrology as far as potentially using GLP-1 agonist. Nephrologist is Dr. Malen Gauze.

## 2022-10-23 NOTE — Assessment & Plan Note (Signed)
Blood pressures look much better controlled today than they have in the past.  As long as she is adherent to her medications, she is doing well.  She remains on very low-dose losartan-dose reduced by nephrology. Is on high-dose carvedilol On Farxiga-reduced dose by nephrology. On 80 mg daily Lasix and not requiring additional dosing. Remains on Isordil 10 mg 3 times daily and hydralazine 50 mg 3 times daily until we can be certain to titrate losartan. Continue CPAP-with oxygen at night and oxygen during the day for activity.

## 2022-10-23 NOTE — Assessment & Plan Note (Signed)
Stable. See above.

## 2022-10-23 NOTE — Assessment & Plan Note (Signed)
Needs to lose weight.  I agree at this [place] without would be helpful, especially in setting of heart failure and diabetes.  Can use semaglutide at low-dose but also could consider increasing to [facility] for obesity indication.

## 2022-10-26 ENCOUNTER — Other Ambulatory Visit (HOSPITAL_COMMUNITY): Payer: Self-pay

## 2022-10-26 DIAGNOSIS — J9610 Chronic respiratory failure, unspecified whether with hypoxia or hypercapnia: Secondary | ICD-10-CM | POA: Diagnosis not present

## 2022-10-26 DIAGNOSIS — I509 Heart failure, unspecified: Secondary | ICD-10-CM | POA: Diagnosis not present

## 2022-10-26 DIAGNOSIS — I272 Pulmonary hypertension, unspecified: Secondary | ICD-10-CM | POA: Diagnosis not present

## 2022-10-26 DIAGNOSIS — L02619 Cutaneous abscess of unspecified foot: Secondary | ICD-10-CM | POA: Diagnosis not present

## 2022-10-26 DIAGNOSIS — L600 Ingrowing nail: Secondary | ICD-10-CM | POA: Diagnosis not present

## 2022-10-26 MED ORDER — MUPIROCIN 2 % EX OINT
1.0000 | TOPICAL_OINTMENT | Freq: Two times a day (BID) | CUTANEOUS | 3 refills | Status: DC
  Filled 2022-10-26: qty 22, 14d supply, fill #0

## 2022-10-28 ENCOUNTER — Encounter: Payer: Self-pay | Admitting: Family Medicine

## 2022-10-28 ENCOUNTER — Ambulatory Visit (INDEPENDENT_AMBULATORY_CARE_PROVIDER_SITE_OTHER): Payer: HMO | Admitting: Family Medicine

## 2022-10-28 VITALS — BP 126/72 | HR 76 | Temp 97.7°F | Ht 68.0 in | Wt 336.2 lb

## 2022-10-28 DIAGNOSIS — D1721 Benign lipomatous neoplasm of skin and subcutaneous tissue of right arm: Secondary | ICD-10-CM | POA: Diagnosis not present

## 2022-10-28 DIAGNOSIS — L600 Ingrowing nail: Secondary | ICD-10-CM | POA: Diagnosis not present

## 2022-10-28 NOTE — Progress Notes (Signed)
Established Patient Office Visit   Subjective:  Patient ID: Stacey Page, female    DOB: [birth year]  Age: 72 y.o. MRN: [id number]  Chief Complaint  Patient presents with   Arm Pain    Right arm sensation x 1 day that radiates to chest. Pt states its not a pain    Arm Pain  Pertinent negatives include no tingling.   Encounter Diagnoses  Name Primary?   Lipoma of right upper extremity Yes   Ingrown nail    She found a lump in her right arm that she wants me to check.  When she lies down on her back she sometimes feels a pressure in her right arm that moves to her upper chest.  There is no new shortness of breath or dyspnea, nausea or diaphoresis.  There is no exertional component.  Getting ready to start semaglutide injectable.  Status post excision of ingrown toenail of medial right great toe.  It sounds like phenol was used for ablation of the matrix.  It is doing well and she would like me to check it.  She brings in her wrist cuff to compare it's reading with our reading.  Patient's wrist cuff measured her pressure and 95/65.   Review of Systems  Constitutional: Negative.  Negative for diaphoresis.  HENT: Negative.    Eyes:  Negative for blurred vision, discharge and redness.  Respiratory: Negative.  Negative for shortness of breath.   Cardiovascular: Negative.  Negative for palpitations.  Gastrointestinal:  Negative for abdominal pain, nausea and vomiting.  Genitourinary: Negative.   Musculoskeletal: Negative.  Negative for myalgias.  Skin:  Negative for rash.  Neurological:  Negative for tingling, loss of consciousness and weakness.  Endo/Heme/Allergies:  Negative for polydipsia.     Current Outpatient Medications:    Accu-Chek Softclix Lancets lancets, May check glucose up to 3 times daily., Disp: 100 each, Rfl: 12   albuterol (VENTOLIN HFA) 108 (90 Base) MCG/ACT inhaler, INHALE 1-2 PUFFS into THE lungs EVERY 6 HOURS AS NEEDED FOR WHEEZING AND/OR SHORTNESS OF  BREATH, Disp: 6.7 g, Rfl: 3   aspirin 81 MG tablet, Take 1 tablet (81 mg total) by mouth daily with breakfast., Disp: 90 tablet, Rfl: 0   atorvastatin (LIPITOR) 20 MG tablet, Take 1 tablet (20 mg total) by mouth daily., Disp: 90 tablet, Rfl: 3   Blood Glucose Monitoring Suppl (ONETOUCH VERIO SYNC SYSTEM) w/Device KIT, 1 kit by Does not apply route daily as needed., Disp: 1 kit, Rfl: 0   carvedilol (COREG) 25 MG tablet, Take 1 tablet (25 mg total) by mouth 2 (two) times daily., Disp: 180 tablet, Rfl: 3   cholecalciferol (VITAMIN D3) 25 MCG (1000 UNIT) tablet, Take 2,000 Units by mouth daily., Disp: , Rfl:    dapagliflozin propanediol (FARXIGA) 5 MG TABS tablet, Take 1 tablet (5 mg total) by mouth daily before breakfast., Disp: 90 tablet, Rfl: 2   Docusate Sodium 100 MG capsule, Take 100 mg by mouth 2 (two) times daily., Disp: , Rfl:    ferrous sulfate 325 (65 FE) MG tablet, Take 325 mg by mouth 2 (two) times daily with a meal., Disp: , Rfl:    fluticasone (FLONASE) 50 MCG/ACT nasal spray, Place 1 spray into both nostrils daily., Disp: 48 g, Rfl: 1   furosemide (LASIX) 80 MG tablet, Take 1 tablet (80 mg total) by mouth daily., Disp: 90 tablet, Rfl: 1   gabapentin (NEURONTIN) 100 MG capsule, Take 1 capsule (100 mg total) by mouth 2 (  two) times daily., Disp: 180 capsule, Rfl: 1   glucose blood (ONETOUCH VERIO) test strip, Use to monitor blood sugar twice daily as instructed, Disp: 200 each, Rfl: 3   hydrALAZINE (APRESOLINE) 50 MG tablet, Take 1 tablet (50 mg total) by mouth every 8 (eight) hours., Disp: 90 tablet, Rfl: 3   isosorbide dinitrate (ISORDIL) 10 MG tablet, Take 1 tablet (10 mg total) by mouth 3 (three) times daily. Please keep scheduled appointment, Disp: 90 tablet, Rfl: 7   losartan (COZAAR) 25 MG tablet, Take 0.5 tablets (12.5 mg total) by mouth daily., Disp: 45 tablet, Rfl: 3   montelukast (SINGULAIR) 10 MG tablet, Take 1 tablet (10 mg total) by mouth at bedtime., Disp: 90 tablet, Rfl: 3    mupirocin ointment (BACTROBAN) 2 %, Apply 1 Application topically 2 (two) times daily for 14 days, Disp: 22 g, Rfl: 3   pantoprazole (PROTONIX) 40 MG tablet, Take 1 tablet (40 mg total) by mouth daily., Disp: 90 tablet, Rfl: 1   potassium chloride (KLOR-CON M) 10 MEQ tablet, Take 1 tablet (10 mEq total) by mouth daily., Disp: 60 tablet, Rfl: 2   Semaglutide,0.25 or 0.5MG /DOS, 2 MG/3ML SOPN, Inject 0.25 mg into the skin once a week., Disp: 3 mL, Rfl: 0   Objective:     BP 126/72   Pulse 76   Temp 97.7 F (36.5 C)   Ht 5\' 8"  (1.727 m)   Wt (!) 336 lb 3.2 oz (152.5 kg)   SpO2 95% Comment: On 2 of Oxygen  BMI 51.12 kg/m    Physical Exam Constitutional:      General: She is not in acute distress.    Appearance: Normal appearance. She is not ill-appearing, toxic-appearing or diaphoretic.  HENT:     Head: Normocephalic and atraumatic.     Right Ear: External ear normal.     Left Ear: External ear normal.  Eyes:     General: No scleral icterus.       Right eye: No discharge.        Left eye: No discharge.     Extraocular Movements: Extraocular movements intact.     Conjunctiva/sclera: Conjunctivae normal.  Pulmonary:     Effort: Pulmonary effort is normal. No respiratory distress.  Skin:    General: Skin is warm and dry.       Neurological:     Mental Status: She is alert and oriented to person, place, and time.  Psychiatric:        Mood and Affect: Mood normal.        Behavior: Behavior normal.      No results found for any visits on 10/28/22.    The 10-year ASCVD risk score (Arnett DK, et al., 2019) is: 19.6%    Assessment & Plan:   Lipoma of right upper extremity  Ingrown nail    Return if symptoms worsen or fail to improve.  Kiasha instructed patient on the use of the semaglutide pen.  Status post excision of ingrown nail of great toe with routine healing.  Information was given on lipomas.  If it changes will refer to surgery.  Discussed alarm symptoms for  above mentioned chest pressure that would necessitate an emergent evaluation.  Mliss Sax, MD

## 2022-10-30 ENCOUNTER — Other Ambulatory Visit (HOSPITAL_COMMUNITY): Payer: Self-pay

## 2022-10-31 ENCOUNTER — Ambulatory Visit: Payer: HMO | Admitting: Family Medicine

## 2022-11-07 ENCOUNTER — Ambulatory Visit (HOSPITAL_COMMUNITY): Payer: HMO | Attending: Cardiology

## 2022-11-07 DIAGNOSIS — I272 Pulmonary hypertension, unspecified: Secondary | ICD-10-CM | POA: Diagnosis not present

## 2022-11-07 DIAGNOSIS — I5032 Chronic diastolic (congestive) heart failure: Secondary | ICD-10-CM | POA: Diagnosis not present

## 2022-11-07 DIAGNOSIS — I350 Nonrheumatic aortic (valve) stenosis: Secondary | ICD-10-CM | POA: Insufficient documentation

## 2022-11-07 LAB — ECHOCARDIOGRAM COMPLETE
AR max vel: 1.44 cm2
AV Area VTI: 1.47 cm2
AV Area mean vel: 1.44 cm2
AV Mean grad: 13 mm[Hg]
AV Peak grad: 23.8 mm[Hg]
Ao pk vel: 2.44 m/s
Area-P 1/2: 2.2 cm2
S' Lateral: 3.7 cm

## 2022-11-09 DIAGNOSIS — L600 Ingrowing nail: Secondary | ICD-10-CM | POA: Diagnosis not present

## 2022-11-09 DIAGNOSIS — M792 Neuralgia and neuritis, unspecified: Secondary | ICD-10-CM | POA: Diagnosis not present

## 2022-11-10 ENCOUNTER — Encounter (INDEPENDENT_AMBULATORY_CARE_PROVIDER_SITE_OTHER): Payer: Self-pay | Admitting: Adult Health

## 2022-11-10 ENCOUNTER — Ambulatory Visit (INDEPENDENT_AMBULATORY_CARE_PROVIDER_SITE_OTHER): Payer: HMO | Admitting: Adult Health

## 2022-11-10 ENCOUNTER — Ambulatory Visit: Payer: Self-pay

## 2022-11-10 ENCOUNTER — Other Ambulatory Visit: Payer: Self-pay

## 2022-11-10 ENCOUNTER — Other Ambulatory Visit (HOSPITAL_COMMUNITY): Payer: Self-pay

## 2022-11-10 VITALS — BP 138/80 | HR 53 | Temp 97.4°F | Ht 68.0 in | Wt 331.0 lb

## 2022-11-10 DIAGNOSIS — Z6841 Body Mass Index (BMI) 40.0 and over, adult: Secondary | ICD-10-CM | POA: Diagnosis not present

## 2022-11-10 DIAGNOSIS — I152 Hypertension secondary to endocrine disorders: Secondary | ICD-10-CM | POA: Diagnosis not present

## 2022-11-10 DIAGNOSIS — Z7985 Long-term (current) use of injectable non-insulin antidiabetic drugs: Secondary | ICD-10-CM

## 2022-11-10 DIAGNOSIS — E1122 Type 2 diabetes mellitus with diabetic chronic kidney disease: Secondary | ICD-10-CM | POA: Diagnosis not present

## 2022-11-10 DIAGNOSIS — E669 Obesity, unspecified: Secondary | ICD-10-CM

## 2022-11-10 DIAGNOSIS — E1159 Type 2 diabetes mellitus with other circulatory complications: Secondary | ICD-10-CM

## 2022-11-10 DIAGNOSIS — Z7984 Long term (current) use of oral hypoglycemic drugs: Secondary | ICD-10-CM

## 2022-11-10 DIAGNOSIS — N184 Chronic kidney disease, stage 4 (severe): Secondary | ICD-10-CM

## 2022-11-10 MED ORDER — OZEMPIC (0.25 OR 0.5 MG/DOSE) 2 MG/3ML ~~LOC~~ SOPN
0.5000 mg | PEN_INJECTOR | SUBCUTANEOUS | 0 refills | Status: DC
  Filled 2022-11-10: qty 3, 28d supply, fill #0

## 2022-11-10 NOTE — Progress Notes (Signed)
WEIGHT SUMMARY AND BIOMETRICS  Vitals Temp: (!) 97.4 F (36.3 C) BP: 138/80 Pulse Rate: (!) 53 SpO2: 96 %   Anthropometric Measurements Height: 5\' 8"  (1.727 m) Weight: (!) 331 lb (150.1 kg) BMI (Calculated): 50.34 Weight at Last Visit: 331lb Weight Lost Since Last Visit: 0 Weight Gained Since Last Visit: 0 Starting Weight: 344lb Total Weight Loss (lbs): 13 lb (5.897 kg)   Body Composition  Body Fat %: 55.2 % Fat Mass (lbs): 183 lbs Muscle Mass (lbs): 141 lbs Total Body Water (lbs): 105.4 lbs Visceral Fat Rating : 23   Other Clinical Data Fasting: no Labs: no Today's Visit #: 68 Starting Date: 03/25/19    Chief Complaint:   OBESITY Stacey Page is here to discuss her progress with her obesity treatment plan. She is on the the Category 2 Plan and states she is following her eating plan approximately 50 % of the time. She states she is exercising Chair Exercises 15 minutes 3 times per week.   Interim History:  Dr. Harding/Cards and Dr. Foster/Nephrology have both agreed that GLP-1 therapy is safe and indicated for Stacey Page. She stared on loading dose Ozempic 0.25mg  once weekly injection on/about 10/13/2022 She has had 2 doses - will take 3rd dose tomorrow. Denies mass in neck, dysphagia, dyspepsia, persistent hoarseness, abdominal pain, or N/V/C   Reviewed Bioimpedance results with pt: Muscle Mass: +2.8 lbs Adipose Mass: -2.4 lbs  Subjective:   1. Hypertension associated with diabetes (HCC) BP slightly increased at OV She denies CP with exertion She is on  aspirin 81 MG tablet  atorvastatin (LIPITOR) 20 MG tablet  isosorbide dinitrate (ISORDIL) 10 MG tablet  carvedilol (COREG) 25 MG tablet  furosemide (LASIX) 80 MG tablet  hydrALAZINE (APRESOLINE) 50 MG tablet  losartan (COZAAR) 25 MG tablet   2. Type 2 diabetes mellitus with stage 4 chronic kidney disease, without long-term current use of insulin (HCC) Lab Results  Component Value Date   HGBA1C  6.5 07/14/2022   HGBA1C 6.5 04/21/2022   HGBA1C 6.3 (H) 11/12/2021  She provided the following home fasting CBG readings: 215 210 6710, 124 She denies sx's of hypoglycemia PCP manages Farxiga 5mg - RECENTLY REDUCED TO 1/2 tab= 2.5mg  every day   Dr. Harding/Cards and Dr. Minerva Areola have both agreed that GLP-1 therapy is safe and indicated for Stacey Page. She stared on loading dose Ozempic 0.25mg  once weekly injection on/about 10/13/2022 She has had 2 doses - will take 3rd dose tomorrow. Denies mass in neck, dysphagia, dyspepsia, persistent hoarseness, abdominal pain, or N/V/C   Assessment/Plan:   1. Hypertension associated with diabetes (HCC) Continue current antihypertensive therapy  2. Type 2 diabetes mellitus with stage 4 chronic kidney disease, without long-term current use of insulin (HCC) Take final two weekly injections of Ozempic 0.25mg  then increase to Ozempic 0.5mg  Refill and increase Semaglutide,0.25 or 0.5MG /DOS, (OZEMPIC, 0.25 OR 0.5 MG/DOSE,) 2 MG/3ML SOPN Inject 0.5 mg into the skin once a week. Dispense: 3 mL, Refills: 0 of 0 remaining   3. Current BMI 50.34  Stacey Page is currently in the action stage of change. As such, her goal is to continue with weight loss efforts. She has agreed to the Category 2 Plan.   Exercise goals: Older adults should follow the adult guidelines. When older adults cannot meet the adult guidelines, they should be as physically active as their abilities and conditions will allow.  Older adults should do exercises that maintain or improve balance if they are at risk of falling.  Older adults should determine their level of effort for physical activity relative to their level of fitness.  Older adults with chronic conditions should understand whether and how their conditions affect their ability to do regular physical activity safely.  Chair Exercises 3 x week Walking 10 mins 2 x week  Behavioral modification strategies: increasing lean  protein intake, decreasing simple carbohydrates, increasing vegetables, increasing water intake, decreasing eating out, no skipping meals, meal planning and cooking strategies, and planning for success.  Stacey Page has agreed to follow-up with our clinic in 4 weeks. She was informed of the importance of frequent follow-up visits to maximize her success with intensive lifestyle modifications for her multiple health conditions.   Check Fasting Labs at next OV  Objective:   Blood pressure 138/80, pulse (!) 53, temperature (!) 97.4 F (36.3 C), height 5\' 8"  (1.727 m), weight (!) 331 lb (150.1 kg), SpO2 96%. Body mass index is 50.33 kg/m.  General: Cooperative, alert, well developed, in no acute distress. HEENT: Conjunctivae and lids unremarkable. Cardiovascular: Regular rhythm.  Lungs: Normal work of breathing. Neurologic: No focal deficits.   Lab Results  Component Value Date   CREATININE 2.09 (H) 07/14/2022   BUN 26 (H) 07/14/2022   NA 141 07/14/2022   K 4.2 07/14/2022   CL 100 07/14/2022   CO2 33 (H) 07/14/2022   Lab Results  Component Value Date   ALT 9 01/25/2022   AST 17 01/25/2022   ALKPHOS 65 01/25/2022   BILITOT 0.4 01/25/2022   Lab Results  Component Value Date   HGBA1C 6.5 07/14/2022   HGBA1C 6.5 04/21/2022   HGBA1C 6.3 (H) 11/12/2021   HGBA1C 6.0 (A) 07/14/2021   HGBA1C 6.4 (A) 04/14/2021   Lab Results  Component Value Date   INSULIN 8.6 10/28/2019   INSULIN 9.2 07/04/2019   INSULIN 8.8 03/25/2019   Lab Results  Component Value Date   TSH 0.51 11/28/2018   Lab Results  Component Value Date   CHOL 144 01/14/2022   HDL 57.10 01/14/2022   LDLCALC 73 01/14/2022   TRIG 66.0 01/14/2022   CHOLHDL 3 01/14/2022   Lab Results  Component Value Date   VD25OH 57.6 10/28/2019   VD25OH 41.5 07/04/2019   VD25OH 38.2 03/25/2019   Lab Results  Component Value Date   WBC 9.5 02/01/2022   HGB 12.3 02/01/2022   HCT 38.3 02/01/2022   MCV 90.1 02/01/2022   PLT  287 02/01/2022   Lab Results  Component Value Date   IRON 29 01/25/2022   TIBC 260 01/25/2022   FERRITIN 51 01/25/2022    Attestation Statements:   Reviewed by clinician on day of visit: allergies, medications, problem list, medical history, surgical history, family history, social history, and previous encounter notes.  I have reviewed the above documentation for accuracy and completeness, and I agree with the above. -  Matteus Mcnelly d. Encarnacion Bole, NP-C

## 2022-11-10 NOTE — Patient Outreach (Signed)
Care Coordination   Follow Up Visit Note   11/10/2022 Name: Mickenzie Primack MRN: [id number] DOB: 12-15-1950  Tiana Burne is a 72 y.o. year old female who sees Mliss Sax, MD for primary care. I spoke with  [name] by phone today.  What matters to the patients health and wellness today?  I had a conversation with Ms. Aundria Rud. She is doing well. She mentioned that the podiatrist had fixed her ingrown toenail. She hasn't lost any weight but has maintained the same weight. She's experiencing some issues with her right shoulder and is thinking about purchasing something to alleviate the pain. She is preparing to start using Ozempic for weight loss and is looking forward to it. Her blood pressure today is 138/80, and she has no complaints of shortness of breath, chest pain, or swelling.      Goals Addressed             This Visit's Progress    I want to monitor and manage my CHF       Patient Goals/Self Care Activities: -Patient/Caregiver will take medications as prescribed   -Patient/Caregiver will attend all scheduled provider appointments -Patient/Caregiver will call pharmacy for medication refills 3-7 days in advance of running out of medications -Patient/Caregiver will call provider office for new concerns or questions  -Patient/Caregiver will focus on medication adherence by taking medications as prescribed   -Weigh daily and record (notify MD with 3 lb weight gain over night or 5 lb in a week) -Follow CHF Action Plan- do a check daily -monitoring her food -continue to exercise 15 minutes /day split into 5 minute increments         SDOH assessments and interventions completed:  No     Care Coordination Interventions:  Yes, provided   Interventions Today    Flowsheet Row Most Recent Value  Chronic Disease   Chronic disease during today's visit Congestive Heart Failure (CHF)  General Interventions   General Interventions Discussed/Reviewed  General Interventions Discussed  Exercise Interventions   Exercise Discussed/Reviewed Exercise Discussed  Nutrition Interventions   Nutrition Discussed/Reviewed Nutrition Discussed  Pharmacy Interventions   Pharmacy Dicussed/Reviewed Pharmacy Topics Discussed  Safety Interventions   Safety Discussed/Reviewed Safety Discussed          Follow up plan: Follow up call scheduled for 12/13/22  230 pm    Encounter Outcome:  Patient Visit Completed   Juanell Fairly RN, BSN, [facility]   Care Coordinator Phone: [phone]

## 2022-11-10 NOTE — Patient Instructions (Signed)
Visit Information  Thank you for taking time to visit with me today. Please don't hesitate to contact me if I can be of assistance to you.   Following are the goals we discussed today:   Goals Addressed             This Visit's Progress    I want to monitor and manage my CHF       Patient Goals/Self Care Activities: -Patient/Caregiver will take medications as prescribed   -Patient/Caregiver will attend all scheduled provider appointments -Patient/Caregiver will call pharmacy for medication refills 3-7 days in advance of running out of medications -Patient/Caregiver will call provider office for new concerns or questions  -Patient/Caregiver will focus on medication adherence by taking medications as prescribed   -Weigh daily and record (notify MD with 3 lb weight gain over night or 5 lb in a week) -Follow CHF Action Plan- do a check daily -monitoring her food -continue to exercise 15 minutes /day split into 5 minute increments         Our next appointment is by telephone on 12/13/22 at 230 pm  Please call the care guide team at [phone] if you need to cancel or reschedule your appointment.   If you are experiencing a Mental Health or Behavioral Health Crisis or need someone to talk to, please call 1-800-273-TALK (toll free, 24 hour hotline)  Patient verbalizes understanding of instructions and care plan provided today and agrees to view in MyChart. Active MyChart status and patient understanding of how to access instructions and care plan via MyChart confirmed with patient.     Juanell Fairly RN, BSN, [facility] [employer] Phone: [phone]

## 2022-11-13 ENCOUNTER — Other Ambulatory Visit (HOSPITAL_COMMUNITY): Payer: Self-pay

## 2022-11-13 ENCOUNTER — Other Ambulatory Visit: Payer: Self-pay | Admitting: Cardiology

## 2022-11-13 ENCOUNTER — Other Ambulatory Visit: Payer: Self-pay | Admitting: Family Medicine

## 2022-11-14 ENCOUNTER — Other Ambulatory Visit (HOSPITAL_COMMUNITY): Payer: Self-pay

## 2022-11-14 ENCOUNTER — Other Ambulatory Visit: Payer: Self-pay

## 2022-11-14 MED ORDER — FUROSEMIDE 80 MG PO TABS
80.0000 mg | ORAL_TABLET | Freq: Every day | ORAL | 1 refills | Status: DC
  Filled 2022-11-14: qty 90, 90d supply, fill #0
  Filled 2023-02-18: qty 90, 90d supply, fill #1

## 2022-11-15 ENCOUNTER — Other Ambulatory Visit: Payer: Self-pay

## 2022-11-15 MED ORDER — HYDRALAZINE HCL 50 MG PO TABS
50.0000 mg | ORAL_TABLET | Freq: Three times a day (TID) | ORAL | 3 refills | Status: DC
  Filled 2022-11-15: qty 90, 30d supply, fill #0
  Filled 2022-12-17: qty 90, 30d supply, fill #1
  Filled 2023-01-29: qty 90, 30d supply, fill #2
  Filled 2023-02-25: qty 90, 30d supply, fill #3

## 2022-11-20 ENCOUNTER — Other Ambulatory Visit (HOSPITAL_COMMUNITY): Payer: Self-pay

## 2022-11-20 DIAGNOSIS — I509 Heart failure, unspecified: Secondary | ICD-10-CM | POA: Diagnosis not present

## 2022-11-20 DIAGNOSIS — J9610 Chronic respiratory failure, unspecified whether with hypoxia or hypercapnia: Secondary | ICD-10-CM | POA: Diagnosis not present

## 2022-11-20 DIAGNOSIS — I272 Pulmonary hypertension, unspecified: Secondary | ICD-10-CM | POA: Diagnosis not present

## 2022-11-21 ENCOUNTER — Other Ambulatory Visit (HOSPITAL_COMMUNITY): Payer: Self-pay

## 2022-11-25 DIAGNOSIS — I272 Pulmonary hypertension, unspecified: Secondary | ICD-10-CM | POA: Diagnosis not present

## 2022-11-25 DIAGNOSIS — I509 Heart failure, unspecified: Secondary | ICD-10-CM | POA: Diagnosis not present

## 2022-11-25 DIAGNOSIS — J9610 Chronic respiratory failure, unspecified whether with hypoxia or hypercapnia: Secondary | ICD-10-CM | POA: Diagnosis not present

## 2022-12-07 DIAGNOSIS — M2041 Other hammer toe(s) (acquired), right foot: Secondary | ICD-10-CM | POA: Diagnosis not present

## 2022-12-07 DIAGNOSIS — M792 Neuralgia and neuritis, unspecified: Secondary | ICD-10-CM | POA: Diagnosis not present

## 2022-12-07 DIAGNOSIS — I739 Peripheral vascular disease, unspecified: Secondary | ICD-10-CM | POA: Diagnosis not present

## 2022-12-07 DIAGNOSIS — B351 Tinea unguium: Secondary | ICD-10-CM | POA: Diagnosis not present

## 2022-12-07 DIAGNOSIS — E1142 Type 2 diabetes mellitus with diabetic polyneuropathy: Secondary | ICD-10-CM | POA: Diagnosis not present

## 2022-12-07 DIAGNOSIS — L600 Ingrowing nail: Secondary | ICD-10-CM | POA: Diagnosis not present

## 2022-12-08 ENCOUNTER — Ambulatory Visit (INDEPENDENT_AMBULATORY_CARE_PROVIDER_SITE_OTHER): Payer: HMO | Admitting: Adult Health

## 2022-12-08 ENCOUNTER — Encounter (INDEPENDENT_AMBULATORY_CARE_PROVIDER_SITE_OTHER): Payer: Self-pay | Admitting: Adult Health

## 2022-12-08 VITALS — BP 116/53 | HR 60 | Temp 97.7°F | Ht 68.0 in | Wt 331.0 lb

## 2022-12-08 DIAGNOSIS — E669 Obesity, unspecified: Secondary | ICD-10-CM | POA: Diagnosis not present

## 2022-12-08 DIAGNOSIS — I152 Hypertension secondary to endocrine disorders: Secondary | ICD-10-CM

## 2022-12-08 DIAGNOSIS — N184 Chronic kidney disease, stage 4 (severe): Secondary | ICD-10-CM

## 2022-12-08 DIAGNOSIS — Z6841 Body Mass Index (BMI) 40.0 and over, adult: Secondary | ICD-10-CM

## 2022-12-08 DIAGNOSIS — E1159 Type 2 diabetes mellitus with other circulatory complications: Secondary | ICD-10-CM

## 2022-12-08 DIAGNOSIS — Z7985 Long-term (current) use of injectable non-insulin antidiabetic drugs: Secondary | ICD-10-CM | POA: Diagnosis not present

## 2022-12-08 DIAGNOSIS — E1122 Type 2 diabetes mellitus with diabetic chronic kidney disease: Secondary | ICD-10-CM

## 2022-12-08 NOTE — Progress Notes (Signed)
WEIGHT SUMMARY AND BIOMETRICS  Vitals Temp: 97.7 F (36.5 C) BP: (!) 116/53 Pulse Rate: 60 SpO2: 91 %   Anthropometric Measurements Height: 5\' 8"  (1.727 m) Weight: (!) 331 lb (150.1 kg) BMI (Calculated): 50.34 Weight at Last Visit: 331lb Weight Lost Since Last Visit: 0 Weight Gained Since Last Visit: 0 Starting Weight: 344lb Total Weight Loss (lbs): 13 lb (5.897 kg)   Body Composition  Body Fat %: 51.8 % Fat Mass (lbs): 171.6 lbs Muscle Mass (lbs): 151.6 lbs Total Body Water (lbs): 105.8 lbs Visceral Fat Rating : 21   Other Clinical Data Fasting: no Labs: yes Today's Visit #: 38 Starting Date: 03/25/19    Chief Complaint:   OBESITY Stacey Page is here to discuss her progress with her obesity treatment plan. She is on the the Category 2 Plan and states she is following her eating plan approximately 50 % of the time. She states she is exercising Chair Exercises 10 minutes 3 times per week.   Interim History:  She has only had two doses of loading dose of Ozempic 0.25mg - injection malfunction.  Reviewed Bioimpedance results with pt: Muscle Mass: +10.6 lbs Adipose Mass: -11.4 lbs  She is NOT wearing supplemental O2- sat on RA 90% She denies dyspnea at present  Of Note- She and 20 family members will take a week long cruise Dec 2024!  Subjective:   1. Stage 4 chronic kidney disease (HCC)  Latest Reference Range & Units 02/08/22 14:37 02/22/22 09:22 07/14/22 13:53  GFR >60.00 mL/min 24.17 (L) 25.82 (L) 23.55 (L)  (L): Data is abnormally low  2. Hypertension associated with diabetes (HCC) BP at goal at OV She is on  aspirin 81 MG tablet  atorvastatin (LIPITOR) 20 MG tablet  isosorbide dinitrate (ISORDIL) 10 MG tablet  carvedilol (COREG) 25 MG tablet  losartan (COZAAR) 25 MG tablet  hydrALAZINE (APRESOLINE) 50 MG tablet  furosemide (LASIX) 80 MG tablet   3. Type 2 diabetes mellitus with stage 4 chronic kidney disease, without long-term current  use of insulin (HCC) Lab Results  Component Value Date   HGBA1C 6.5 07/14/2022   HGBA1C 6.5 04/21/2022   HGBA1C 6.3 (H) 11/12/2021   Dr. Harding/Cards and Dr. Foster/Nephrology have both agreed that GLP-1 therapy is safe and indicated for Stacey Page. She stared on loading dose Ozempic 0.25mg  once weekly injection on/about 10/13/2022 Ozempic increased to 0.5mg   on 11/10/2022  Per pt- Ozempic 0.25mg  injection malfunction, therefore she has had only 2 doses of loading dose 0.25mg . She denies SE  She did receive Ozempic 0.5mg  supply.  Assessment/Plan:   1. Stage 4 chronic kidney disease (HCC) Keep BP well controlled Continue to avoid Nephrotoxic substances  2. Hypertension associated with diabetes (HCC) Continue  aspirin 81 MG tablet  atorvastatin (LIPITOR) 20 MG tablet  isosorbide dinitrate (ISORDIL) 10 MG tablet  carvedilol (COREG) 25 MG tablet  losartan (COZAAR) 25 MG tablet  hydrALAZINE (APRESOLINE) 50 MG tablet  furosemide (LASIX) 80 MG tablet   3. Type 2 diabetes mellitus with stage 4 chronic kidney disease, without long-term current use of insulin (HCC) 11/1 Ozempic 0.25 mg 11/8 Ozempic 0.25mg  11/15 INCREASE to Ozempic 0.5mg  11/22 Ozempic 0.5mg  11/29 Ozempic 0.5mg   4. Current BMI 50.34  Stacey Page is currently in the action stage of change. As such, her goal is to continue with weight loss efforts. She has agreed to the Category 2 Plan.   Exercise goals: Older adults should follow the adult guidelines. When older adults cannot meet  the adult guidelines, they should be as physically active as their abilities and conditions will allow.  Older adults should do exercises that maintain or improve balance if they are at risk of falling.  Older adults should determine their level of effort for physical activity relative to their level of fitness.  Older adults with chronic conditions should understand whether and how their conditions affect their ability to do regular physical  activity safely.  Behavioral modification strategies: increasing lean protein intake, decreasing simple carbohydrates, increasing vegetables, increasing water intake, no skipping meals, meal planning and cooking strategies, better snacking choices, and planning for success.  Stacey Page has agreed to follow-up with our clinic in 4 weeks. She was informed of the importance of frequent follow-up visits to maximize her success with intensive lifestyle modifications for her multiple health conditions.   Check Fasting Labs MONDAY - pt aware to arrive fasting an after 0730 12/12/2022  Objective:   Blood pressure (!) 116/53, pulse 60, temperature 97.7 F (36.5 C), height 5\' 8"  (1.727 m), weight (!) 331 lb (150.1 kg), SpO2 91%. Body mass index is 50.33 kg/m.  General: Cooperative, alert, well developed, in no acute distress. HEENT: Conjunctivae and lids unremarkable. Cardiovascular: Regular rhythm.  Lungs: Normal work of breathing. Neurologic: No focal deficits.   Lab Results  Component Value Date   CREATININE 2.09 (H) 07/14/2022   BUN 26 (H) 07/14/2022   NA 141 07/14/2022   K 4.2 07/14/2022   CL 100 07/14/2022   CO2 33 (H) 07/14/2022   Lab Results  Component Value Date   ALT 9 01/25/2022   AST 17 01/25/2022   ALKPHOS 65 01/25/2022   BILITOT 0.4 01/25/2022   Lab Results  Component Value Date   HGBA1C 6.5 07/14/2022   HGBA1C 6.5 04/21/2022   HGBA1C 6.3 (H) 11/12/2021   HGBA1C 6.0 (A) 07/14/2021   HGBA1C 6.4 (A) 04/14/2021   Lab Results  Component Value Date   INSULIN 8.6 10/28/2019   INSULIN 9.2 07/04/2019   INSULIN 8.8 03/25/2019   Lab Results  Component Value Date   TSH 0.51 11/28/2018   Lab Results  Component Value Date   CHOL 144 01/14/2022   HDL 57.10 01/14/2022   LDLCALC 73 01/14/2022   TRIG 66.0 01/14/2022   CHOLHDL 3 01/14/2022   Lab Results  Component Value Date   VD25OH 57.6 10/28/2019   VD25OH 41.5 07/04/2019   VD25OH 38.2 03/25/2019   Lab Results   Component Value Date   WBC 9.5 02/01/2022   HGB 12.3 02/01/2022   HCT 38.3 02/01/2022   MCV 90.1 02/01/2022   PLT 287 02/01/2022   Lab Results  Component Value Date   IRON 29 01/25/2022   TIBC 260 01/25/2022   FERRITIN 51 01/25/2022    Attestation Statements:   Reviewed by clinician on day of visit: allergies, medications, problem list, medical history, surgical history, family history, social history, and previous encounter notes.  Time spent on visit including pre-visit chart review and post-visit care and charting was 28 minutes.   I have reviewed the above documentation for accuracy and completeness, and I agree with the above. -  Arley Garant d. Jahkari Maclin, NP-C

## 2022-12-12 ENCOUNTER — Other Ambulatory Visit: Payer: Self-pay

## 2022-12-12 ENCOUNTER — Other Ambulatory Visit (INDEPENDENT_AMBULATORY_CARE_PROVIDER_SITE_OTHER): Payer: Self-pay | Admitting: Adult Health

## 2022-12-12 DIAGNOSIS — Z0000 Encounter for general adult medical examination without abnormal findings: Secondary | ICD-10-CM | POA: Diagnosis not present

## 2022-12-12 DIAGNOSIS — N184 Chronic kidney disease, stage 4 (severe): Secondary | ICD-10-CM

## 2022-12-12 DIAGNOSIS — E1169 Type 2 diabetes mellitus with other specified complication: Secondary | ICD-10-CM | POA: Diagnosis not present

## 2022-12-12 DIAGNOSIS — E559 Vitamin D deficiency, unspecified: Secondary | ICD-10-CM

## 2022-12-12 DIAGNOSIS — E785 Hyperlipidemia, unspecified: Secondary | ICD-10-CM

## 2022-12-13 ENCOUNTER — Ambulatory Visit: Payer: Self-pay

## 2022-12-13 LAB — LIPID PANEL
Chol/HDL Ratio: 2.3 ratio (ref 0.0–4.4)
Cholesterol, Total: 144 mg/dL (ref 100–199)
HDL: 63 mg/dL (ref >39)
LDL Chol Calc (NIH): 68 mg/dL (ref 0–99)
Triglycerides: 62 mg/dL (ref 0–149)
VLDL Cholesterol Cal: 13 mg/dL (ref 5–40)

## 2022-12-13 LAB — HEMOGLOBIN A1C
Est. average glucose Bld gHb Est-mCnc: 131 mg/dL
Hgb A1c MFr Bld: 6.2 % — ABNORMAL HIGH (ref 4.8–5.6)

## 2022-12-13 LAB — COMPREHENSIVE METABOLIC PANEL
ALT: 8 [IU]/L (ref 0–32)
AST: 11 [IU]/L (ref 0–40)
Albumin: 4.2 g/dL (ref 3.8–4.8)
Alkaline Phosphatase: 103 [IU]/L (ref 44–121)
BUN/Creatinine Ratio: 14 (ref 12–28)
BUN: 30 mg/dL — ABNORMAL HIGH (ref 8–27)
Bilirubin Total: 0.4 mg/dL (ref 0.0–1.2)
CO2: 28 mmol/L (ref 20–29)
Calcium: 9.6 mg/dL (ref 8.7–10.3)
Chloride: 100 mmol/L (ref 96–106)
Creatinine, Ser: 2.07 mg/dL — ABNORMAL HIGH (ref 0.57–1.00)
Globulin, Total: 3.2 g/dL (ref 1.5–4.5)
Glucose: 86 mg/dL (ref 70–99)
Potassium: 4.1 mmol/L (ref 3.5–5.2)
Sodium: 142 mmol/L (ref 134–144)
Total Protein: 7.4 g/dL (ref 6.0–8.5)
eGFR: 25 mL/min/{1.73_m2} — ABNORMAL LOW (ref >59)

## 2022-12-13 LAB — VITAMIN D 25 HYDROXY (VIT D DEFICIENCY, FRACTURES): Vit D, 25-Hydroxy: 36.8 ng/mL (ref 30.0–100.0)

## 2022-12-13 LAB — INSULIN, RANDOM: INSULIN: 10.8 u[IU]/mL (ref 2.6–24.9)

## 2022-12-13 NOTE — Patient Instructions (Signed)
Visit Information  Thank you for taking time to visit with me today. Please don't hesitate to contact me if I can be of assistance to you.   Following are the goals we discussed today:   Goals Addressed             This Visit's Progress    I want to monitor and manage my CHF       Patient Goals/Self Care Activities: -Patient/Caregiver will take medications as prescribed   -Patient/Caregiver will attend all scheduled provider appointments -Patient/Caregiver will call pharmacy for medication refills 3-7 days in advance of running out of medications -Patient/Caregiver will call provider office for new concerns or questions  -Patient/Caregiver will focus on medication adherence by taking medications as prescribed   -Weigh daily and record (notify MD with 3 lb weight gain over night or 5 lb in a week) -Follow CHF Action Plan- do a check daily -continue to exercise 15 minutes /day split into 5 minute increments Wt Readings from Last 3 Encounters:  12/08/22 (!) 331 lb (150.1 kg)  11/10/22 (!) 331 lb (150.1 kg)  10/28/22 (!) 336 lb 3.2 oz (152.5 kg)           Our next appointment is by telephone on 01/12/23 at 2 pm  Please call the care guide team at [phone] if you need to cancel or reschedule your appointment.   If you are experiencing a Mental Health or Behavioral Health Crisis or need someone to talk to, please call 1-800-273-TALK (toll free, 24 hour hotline)  Patient verbalizes understanding of instructions and care plan provided today and agrees to view in MyChart. Active MyChart status and patient understanding of how to access instructions and care plan via MyChart confirmed with patient.     Stacey Fairly RN, BSN, [facility] [place]  [facility], [facility]  Care Coordinator Phone: [phone]

## 2022-12-13 NOTE — Patient Outreach (Signed)
  Care Coordination   Follow Up Visit Note   12/13/2022 Name: Stacey Page MRN: [id number] DOB: 06/03/1950  Stacey Page is a 72 y.o. year old female who sees Stacey Sax, MD for primary care. I spoke with  Stacey Page by phone today.  What matters to the patients health and wellness today?  I had a telephone conversation with Stacey Page, during which she indicated that she is using Ozempic. She has used three applications, this week marks her fourth. Stacey Page has observed a reduction in body fat, an increase in muscle mass, and a notable increase in her energy levels. She reports no symptoms of chest pain, shortness of breath, or swelling. Furthermore, she has scheduled an appointment with her nephrologist for December 20th and intends to go on a cruise later in December.     Goals Addressed             This Visit's Progress    I want to monitor and manage my CHF       Patient Goals/Self Care Activities: -Patient/Caregiver will take medications as prescribed   -Patient/Caregiver will attend all scheduled provider appointments -Patient/Caregiver will call pharmacy for medication refills 3-7 days in advance of running out of medications -Patient/Caregiver will call provider office for new concerns or questions  -Patient/Caregiver will focus on medication adherence by taking medications as prescribed   -Weigh daily and record (notify MD with 3 lb weight gain over night or 5 lb in a week) -Follow CHF Action Plan- do a check daily -continue to exercise 15 minutes /day split into 5 minute increments Wt Readings from Last 3 Encounters:  12/08/22 (!) 331 lb (150.1 kg)  11/10/22 (!) 331 lb (150.1 kg)  10/28/22 (!) 336 lb 3.2 oz (152.5 kg)           SDOH assessments and interventions completed:  No     Care Coordination Interventions:  Yes, provided   Interventions Today    Flowsheet Row Most Recent Value  Chronic Disease   Chronic disease during  today's visit Congestive Heart Failure (CHF)  General Interventions   General Interventions Discussed/Reviewed General Interventions Discussed  Exercise Interventions   Exercise Discussed/Reviewed Exercise Discussed  Nutrition Interventions   Nutrition Discussed/Reviewed Nutrition Discussed  Pharmacy Interventions   Pharmacy Dicussed/Reviewed Pharmacy Topics Discussed  Safety Interventions   Safety Discussed/Reviewed Safety Discussed        Follow up plan: Follow up call scheduled for 01/12/23  2 pm    Encounter Outcome:  Patient Visit Completed   Stacey Fairly RN, BSN, [facility]  [facility], [facility]  Care Coordinator Phone: [phone]

## 2022-12-17 ENCOUNTER — Other Ambulatory Visit: Payer: Self-pay | Admitting: Cardiology

## 2022-12-19 ENCOUNTER — Other Ambulatory Visit (HOSPITAL_COMMUNITY): Payer: Self-pay

## 2022-12-19 ENCOUNTER — Other Ambulatory Visit: Payer: Self-pay

## 2022-12-20 ENCOUNTER — Encounter: Payer: Self-pay | Admitting: Family Medicine

## 2022-12-20 ENCOUNTER — Ambulatory Visit (INDEPENDENT_AMBULATORY_CARE_PROVIDER_SITE_OTHER): Payer: HMO | Admitting: Family Medicine

## 2022-12-20 ENCOUNTER — Other Ambulatory Visit (HOSPITAL_COMMUNITY): Payer: Self-pay

## 2022-12-20 ENCOUNTER — Other Ambulatory Visit: Payer: Self-pay

## 2022-12-20 VITALS — BP 132/70 | HR 62 | Temp 98.0°F | Ht 68.0 in | Wt 336.6 lb

## 2022-12-20 DIAGNOSIS — J069 Acute upper respiratory infection, unspecified: Secondary | ICD-10-CM | POA: Insufficient documentation

## 2022-12-20 DIAGNOSIS — N184 Chronic kidney disease, stage 4 (severe): Secondary | ICD-10-CM | POA: Diagnosis not present

## 2022-12-20 DIAGNOSIS — J029 Acute pharyngitis, unspecified: Secondary | ICD-10-CM | POA: Diagnosis not present

## 2022-12-20 LAB — POCT RAPID STREP A (OFFICE): Rapid Strep A Screen: NEGATIVE

## 2022-12-20 MED ORDER — NYSTATIN 100000 UNIT/ML MT SUSP
5.0000 mL | Freq: Three times a day (TID) | OROMUCOSAL | 0 refills | Status: DC | PRN
  Filled 2022-12-20: qty 150, 10d supply, fill #0

## 2022-12-20 MED ORDER — ISOSORBIDE DINITRATE 10 MG PO TABS
10.0000 mg | ORAL_TABLET | Freq: Three times a day (TID) | ORAL | 3 refills | Status: DC
  Filled 2022-12-20: qty 270, 90d supply, fill #0
  Filled 2023-02-25: qty 40, 14d supply, fill #1
  Filled 2023-02-28: qty 230, 76d supply, fill #1
  Filled 2023-07-22 – 2023-07-25 (×2): qty 270, 90d supply, fill #2
  Filled 2023-10-10 – 2023-10-11 (×2): qty 270, 90d supply, fill #3

## 2022-12-20 NOTE — Progress Notes (Signed)
Established Patient Office Visit   Subjective:  Patient ID: Stacey Page, female    DOB: Jan 27, 1951  Age: 72 y.o. MRN: [id number]  Chief Complaint  Patient presents with   Sore Throat    Sore throat x 1 day. Hurst to swallow liquids and solid foods.     Sore Throat  Pertinent negatives include no abdominal pain.   Encounter Diagnoses  Name Primary?   Sore throat Yes   Viral pharyngitis    1 day history of sore throat.  It hurts to swallow most anything.  She has tried Cepacol lozenges.  They have not been helpful.  She denies fevers chills, headaches, nausea or vomiting or cough.  There has been no dysphagia   Review of Systems  Constitutional: Negative.   HENT:  Positive for sore throat.   Eyes:  Negative for blurred vision, discharge and redness.  Respiratory: Negative.    Cardiovascular: Negative.   Gastrointestinal:  Negative for abdominal pain.  Genitourinary: Negative.   Musculoskeletal: Negative.  Negative for myalgias.  Skin:  Negative for rash.  Neurological:  Negative for tingling, loss of consciousness and weakness.  Endo/Heme/Allergies:  Negative for polydipsia.     Current Outpatient Medications:    Accu-Chek Softclix Lancets lancets, May check glucose up to 3 times daily., Disp: 100 each, Rfl: 12   albuterol (VENTOLIN HFA) 108 (90 Base) MCG/ACT inhaler, INHALE 1-2 PUFFS into THE lungs EVERY 6 HOURS AS NEEDED FOR WHEEZING AND/OR SHORTNESS OF BREATH, Disp: 6.7 g, Rfl: 3   aspirin 81 MG tablet, Take 1 tablet (81 mg total) by mouth daily with breakfast., Disp: 90 tablet, Rfl: 0   atorvastatin (LIPITOR) 20 MG tablet, Take 1 tablet (20 mg total) by mouth daily., Disp: 90 tablet, Rfl: 3   Blood Glucose Monitoring Suppl (ONETOUCH VERIO SYNC SYSTEM) w/Device KIT, 1 kit by Does not apply route daily as needed., Disp: 1 kit, Rfl: 0   carvedilol (COREG) 25 MG tablet, Take 1 tablet (25 mg total) by mouth 2 (two) times daily., Disp: 180 tablet, Rfl: 3    cholecalciferol (VITAMIN D3) 25 MCG (1000 UNIT) tablet, Take 2,000 Units by mouth daily., Disp: , Rfl:    dapagliflozin propanediol (FARXIGA) 5 MG TABS tablet, Take 1 tablet (5 mg total) by mouth daily before breakfast., Disp: 90 tablet, Rfl: 2   Docusate Sodium 100 MG capsule, Take 100 mg by mouth 2 (two) times daily., Disp: , Rfl:    ferrous sulfate 325 (65 FE) MG tablet, Take 325 mg by mouth 2 (two) times daily with a meal., Disp: , Rfl:    fluticasone (FLONASE) 50 MCG/ACT nasal spray, Place 1 spray into both nostrils daily., Disp: 48 g, Rfl: 1   furosemide (LASIX) 80 MG tablet, Take 1 tablet (80 mg total) by mouth daily., Disp: 90 tablet, Rfl: 1   gabapentin (NEURONTIN) 100 MG capsule, Take 1 capsule (100 mg total) by mouth 2 (two) times daily., Disp: 180 capsule, Rfl: 1   glucose blood (ONETOUCH VERIO) test strip, Use to monitor blood sugar twice daily as instructed, Disp: 200 each, Rfl: 3   hydrALAZINE (APRESOLINE) 50 MG tablet, Take 1 tablet (50 mg total) by mouth every 8 (eight) hours., Disp: 90 tablet, Rfl: 3   losartan (COZAAR) 25 MG tablet, Take 0.5 tablets (12.5 mg total) by mouth daily., Disp: 45 tablet, Rfl: 3   magic mouthwash w/lidocaine SOLN, Take 5 mLs by mouth 3 (three) times daily as needed for mouth pain. Suspension contains  equal amounts of Maalox Extra Strength, nystatin, diphenhydramine and lidocaine., Disp: 150 mL, Rfl: 0   montelukast (SINGULAIR) 10 MG tablet, Take 1 tablet (10 mg total) by mouth at bedtime., Disp: 90 tablet, Rfl: 3   mupirocin ointment (BACTROBAN) 2 %, Apply 1 Application topically 2 (two) times daily for 14 days, Disp: 22 g, Rfl: 3   pantoprazole (PROTONIX) 40 MG tablet, Take 1 tablet (40 mg total) by mouth daily., Disp: 90 tablet, Rfl: 1   potassium chloride (KLOR-CON M) 10 MEQ tablet, Take 1 tablet (10 mEq total) by mouth daily., Disp: 60 tablet, Rfl: 2   Semaglutide,0.25 or 0.5MG /DOS, (OZEMPIC, 0.25 OR 0.5 MG/DOSE,) 2 MG/3ML SOPN, Inject 0.5 mg into the  skin once a week., Disp: 3 mL, Rfl: 0   isosorbide dinitrate (ISORDIL) 10 MG tablet, Take 1 tablet (10 mg total) by mouth 3 (three) times daily. Please keep scheduled appointment, Disp: 270 tablet, Rfl: 3   Objective:     BP 132/70   Pulse 62   Temp 98 F (36.7 C)   Ht 5\' 8"  (1.727 m)   Wt (!) 336 lb 9.6 oz (152.7 kg)   SpO2 96% Comment: 2 liter of O2  BMI 51.18 kg/m    Physical Exam Constitutional:      General: She is not in acute distress.    Appearance: Normal appearance. She is not ill-appearing, toxic-appearing or diaphoretic.  HENT:     Head: Normocephalic and atraumatic.     Right Ear: External ear normal.     Left Ear: External ear normal.     Mouth/Throat:     Mouth: Mucous membranes are moist. No oral lesions.     Pharynx: Oropharynx is clear. Uvula midline. No pharyngeal swelling, oropharyngeal exudate, posterior oropharyngeal erythema or uvula swelling.     Tonsils: No tonsillar exudate or tonsillar abscesses. 0 on the right. 0 on the left.  Eyes:     General: No scleral icterus.       Right eye: No discharge.        Left eye: No discharge.     Extraocular Movements: Extraocular movements intact.     Conjunctiva/sclera: Conjunctivae normal.     Pupils: Pupils are equal, round, and reactive to light.  Neck:     Thyroid: No thyromegaly.  Cardiovascular:     Rate and Rhythm: Normal rate and regular rhythm.  Pulmonary:     Effort: Pulmonary effort is normal. No respiratory distress.     Breath sounds: Normal breath sounds.  Abdominal:     General: Bowel sounds are normal.     Tenderness: There is no abdominal tenderness. There is no guarding.  Musculoskeletal:     Cervical back: No rigidity or tenderness.  Lymphadenopathy:     Cervical: No cervical adenopathy.  Skin:    General: Skin is warm and dry.  Neurological:     Mental Status: She is alert and oriented to person, place, and time.  Psychiatric:        Mood and Affect: Mood normal.         Behavior: Behavior normal.      No results found for any visits on 12/20/22.    The 10-year ASCVD risk score (Arnett DK, et al., 2019) is: 22.6%    Assessment & Plan:   Sore throat -     POCT rapid strep A -     magic mouthwash w/lidocaine; Take 5 mLs by mouth 3 (three) times daily as needed  for mouth pain. Suspension contains equal amounts of Maalox Extra Strength, nystatin, diphenhydramine and lidocaine.  Dispense: 150 mL; Refill: 0  Viral pharyngitis    Return if symptoms worsen or fail to improve.    Mliss Sax, MD

## 2022-12-21 ENCOUNTER — Other Ambulatory Visit: Payer: Self-pay

## 2022-12-21 DIAGNOSIS — I272 Pulmonary hypertension, unspecified: Secondary | ICD-10-CM | POA: Diagnosis not present

## 2022-12-21 DIAGNOSIS — J9610 Chronic respiratory failure, unspecified whether with hypoxia or hypercapnia: Secondary | ICD-10-CM | POA: Diagnosis not present

## 2022-12-21 DIAGNOSIS — I509 Heart failure, unspecified: Secondary | ICD-10-CM | POA: Diagnosis not present

## 2022-12-26 DIAGNOSIS — I272 Pulmonary hypertension, unspecified: Secondary | ICD-10-CM | POA: Diagnosis not present

## 2022-12-26 DIAGNOSIS — I509 Heart failure, unspecified: Secondary | ICD-10-CM | POA: Diagnosis not present

## 2022-12-26 DIAGNOSIS — J9610 Chronic respiratory failure, unspecified whether with hypoxia or hypercapnia: Secondary | ICD-10-CM | POA: Diagnosis not present

## 2022-12-28 DIAGNOSIS — D631 Anemia in chronic kidney disease: Secondary | ICD-10-CM | POA: Diagnosis not present

## 2022-12-28 DIAGNOSIS — N184 Chronic kidney disease, stage 4 (severe): Secondary | ICD-10-CM | POA: Diagnosis not present

## 2022-12-28 DIAGNOSIS — E1122 Type 2 diabetes mellitus with diabetic chronic kidney disease: Secondary | ICD-10-CM | POA: Diagnosis not present

## 2022-12-28 DIAGNOSIS — I129 Hypertensive chronic kidney disease with stage 1 through stage 4 chronic kidney disease, or unspecified chronic kidney disease: Secondary | ICD-10-CM | POA: Diagnosis not present

## 2022-12-28 DIAGNOSIS — N2581 Secondary hyperparathyroidism of renal origin: Secondary | ICD-10-CM | POA: Diagnosis not present

## 2022-12-28 DIAGNOSIS — I5032 Chronic diastolic (congestive) heart failure: Secondary | ICD-10-CM | POA: Diagnosis not present

## 2023-01-02 ENCOUNTER — Encounter (INDEPENDENT_AMBULATORY_CARE_PROVIDER_SITE_OTHER): Payer: Self-pay | Admitting: Adult Health

## 2023-01-02 ENCOUNTER — Ambulatory Visit (INDEPENDENT_AMBULATORY_CARE_PROVIDER_SITE_OTHER): Payer: HMO | Admitting: Adult Health

## 2023-01-02 ENCOUNTER — Other Ambulatory Visit: Payer: Self-pay

## 2023-01-02 ENCOUNTER — Telehealth: Payer: Self-pay | Admitting: Pulmonary Disease

## 2023-01-02 VITALS — BP 137/78 | HR 60 | Temp 97.7°F | Ht 68.0 in | Wt 331.0 lb

## 2023-01-02 DIAGNOSIS — Z6841 Body Mass Index (BMI) 40.0 and over, adult: Secondary | ICD-10-CM

## 2023-01-02 DIAGNOSIS — E785 Hyperlipidemia, unspecified: Secondary | ICD-10-CM | POA: Diagnosis not present

## 2023-01-02 DIAGNOSIS — Z7985 Long-term (current) use of injectable non-insulin antidiabetic drugs: Secondary | ICD-10-CM

## 2023-01-02 DIAGNOSIS — E1169 Type 2 diabetes mellitus with other specified complication: Secondary | ICD-10-CM | POA: Diagnosis not present

## 2023-01-02 DIAGNOSIS — N184 Chronic kidney disease, stage 4 (severe): Secondary | ICD-10-CM

## 2023-01-02 DIAGNOSIS — E669 Obesity, unspecified: Secondary | ICD-10-CM

## 2023-01-02 DIAGNOSIS — I5032 Chronic diastolic (congestive) heart failure: Secondary | ICD-10-CM | POA: Diagnosis not present

## 2023-01-02 DIAGNOSIS — E559 Vitamin D deficiency, unspecified: Secondary | ICD-10-CM

## 2023-01-02 MED ORDER — OZEMPIC (0.25 OR 0.5 MG/DOSE) 2 MG/3ML ~~LOC~~ SOPN
0.5000 mg | PEN_INJECTOR | SUBCUTANEOUS | 0 refills | Status: DC
  Filled 2023-01-02: qty 3, 28d supply, fill #0

## 2023-01-02 NOTE — Progress Notes (Signed)
WEIGHT SUMMARY AND BIOMETRICS  Vitals Temp: 97.7 F (36.5 C) BP: 137/78 Pulse Rate: 60 SpO2: 90 %   Anthropometric Measurements Height: 5\' 8"  (1.727 m) Weight: (!) 331 lb (150.1 kg) BMI (Calculated): 50.34 Weight at Last Visit: 331lb Weight Lost Since Last Visit: 0 Weight Gained Since Last Visit: 0 Starting Weight: 344lb Total Weight Loss (lbs): 13 lb (5.897 kg)   Body Composition  Body Fat %: 55.7 % Fat Mass (lbs): 184.6 lbs Muscle Mass (lbs): 139.4 lbs Total Body Water (lbs): 105.2 lbs Visceral Fat Rating : 23   Other Clinical Data Fasting: yes Labs: no Today's Visit #: 72 Starting Date: 03/25/19    Chief Complaint:   OBESITY Stacey Page is here to discuss her progress with her obesity treatment plan. She is on the the Category 2 Plan and states she is following her eating plan approximately 50 % of the time. She states she is exercising Chair Exercises 5 minutes 3 times per week.   Interim History:  Stacey Page will take a 6 day [place] cruise late December with 20 family members! She is looking forward to this trip- discussed travel strategies  She states "I am tired of sitting in the house and watching TV". She has been visiting her family and running errands more often- great! She is able to walk/leave her home on supplemental O2- currently on [facility]  Subjective:   1. Hyperlipidemia associated with type 2 diabetes mellitus Stacey Page) Discussed Labs Lipid Panel     Component Value Date/Time   CHOL 144 12/12/2022 1547   TRIG 62 12/12/2022 1547   HDL 63 12/12/2022 1547   CHOLHDL 2.3 12/12/2022 1547   CHOLHDL 3 01/14/2022 0901   VLDL 13.2 01/14/2022 0901   LDLCALC 68 12/12/2022 1547   LABVLDL 13 12/12/2022 1547    Lipid panel stable She is on daily Lipitor 20mg - denies myalgias  2. Chronic diastolic heart failure (HCC) Discussed Labs CMP and lipid panel- stable She is currently on  aspirin 81 MG tablet  atorvastatin (LIPITOR) 20 MG tablet   carvedilol (COREG) 25 MG tablet  losartan (COZAAR) 25 MG tablet  hydrALAZINE (APRESOLINE) 50 MG tablet  furosemide (LASIX) 80 MG tablet  isosorbide dinitrate (ISORDIL) 10 MG tablet   3. Type 2 diabetes mellitus with other specified complication, without long-term current use of insulin (HCC) Discussed Labs Lab Results  Component Value Date   HGBA1C 6.2 (H) 12/12/2022   HGBA1C 6.5 07/14/2022   HGBA1C 6.5 04/21/2022    Latest Reference Range & Units 12/12/22 15:47  INSULIN 2.6 - 24.9 uIU/mL 10.8   Dr. Harding/Cards and Dr. Foster/Nephrology have both agreed that GLP-1 therapy is safe and indicated for Stacey Page. She stared on loading dose Ozempic 0.25mg  once weekly injection on/about 10/13/2022 Ozempic increased to 0.5mg   on 11/10/2022  A1c at goal Stacey Page provided the following fasting CBG readings for the last 1.5 weeks 89,92,88,80,88,86,84,82 She denies sx's of hypoglycemia  She is on daily Farxiga 5mg  and weekly Ozempic 0.5 mg Denies mass in neck, dysphagia, dyspepsia, persistent hoarseness, abdominal pain, or N/V/C   4. Stage 4 chronic kidney disease (HCC) Discussed Labs  Latest Reference Range & Units 07/14/22 13:53 12/12/22 15:47  Creatinine 0.57 - 1.00 mg/dL 1.61 (H) 0.96 (H)  (H): Data is abnormally high  Latest Reference Range & Units 06/24/22 16:44 09/24/22 16:43 12/12/22 15:47  eGFR >59 mL/min/1.73 21.0 (E) 25.0 (E) 25 (L)  (L): Data is abnormally low (E): External lab result  She was seen by Nephrology last- Farxiga reduced to 5mg   CKD stable  5. Vitamin D deficiency Discussed Labs  Latest Reference Range & Units 12/12/22 15:47  Vitamin D, 25-Hydroxy 30.0 - 100.0 ng/mL 36.8   She endorses stable energy levels. She is on daily OTC Vit D 3 1,000 international units   Assessment/Plan:   1. Hyperlipidemia associated with type 2 diabetes mellitus (HCC) Continue statin therapy per Cards team  2. Chronic diastolic heart failure (HCC) Continue current  medication regime and f/u with Cards teams  3. Type 2 diabetes mellitus with other specified complication, without long-term current use of insulin (HCC) Refill Semaglutide,0.25 or 0.5MG /DOS, (OZEMPIC, 0.25 OR 0.5 MG/DOSE,) 2 MG/3ML SOPN Inject 0.5 mg into the skin once a week. Dispense: 3 mL, Refills: 0 of 0 remaining   4. Stage 4 chronic kidney disease (HCC) Continue to avoid Nephrotoxic substances and close f/u with Nephrologist  5. Vitamin D deficiency Continue OTC supplementation  6. Current BMI 50.34  Stacey Page is currently in the action stage of change. As such, her goal is to continue with weight loss efforts. She has agreed to the Category 2 Plan.   Exercise goals: Older adults should follow the adult guidelines. When older adults cannot meet the adult guidelines, they should be as physically active as their abilities and conditions will allow.   Behavioral modification strategies: increasing lean protein intake, decreasing simple carbohydrates, increasing vegetables, no skipping meals, meal planning and cooking strategies, keeping healthy foods in the home, ways to avoid boredom eating, and planning for success.  Stacey Page has agreed to follow-up with our clinic in 4 weeks. She was informed of the importance of frequent follow-up visits to maximize her success with intensive lifestyle modifications for her multiple health conditions.   Objective:   Blood pressure 137/78, pulse 60, temperature 97.7 F (36.5 C), height 5\' 8"  (1.727 m), weight (!) 331 lb (150.1 kg), SpO2 90%. Body mass index is 50.33 kg/m.  General: Cooperative, alert, well developed, in no acute distress. HEENT: Conjunctivae and lids unremarkable. Cardiovascular: Regular rhythm.  Lungs: Normal work of breathing. Neurologic: No focal deficits.   Lab Results  Component Value Date   CREATININE 2.07 (H) 12/12/2022   BUN 30 (H) 12/12/2022   NA 142 12/12/2022   K 4.1 12/12/2022   CL 100 12/12/2022   CO2 28  12/12/2022   Lab Results  Component Value Date   ALT 8 12/12/2022   AST 11 12/12/2022   ALKPHOS 103 12/12/2022   BILITOT 0.4 12/12/2022   Lab Results  Component Value Date   HGBA1C 6.2 (H) 12/12/2022   HGBA1C 6.5 07/14/2022   HGBA1C 6.5 04/21/2022   HGBA1C 6.3 (H) 11/12/2021   HGBA1C 6.0 (A) 07/14/2021   Lab Results  Component Value Date   INSULIN 10.8 12/12/2022   INSULIN 8.6 10/28/2019   INSULIN 9.2 07/04/2019   INSULIN 8.8 03/25/2019   Lab Results  Component Value Date   TSH 0.51 11/28/2018   Lab Results  Component Value Date   CHOL 144 12/12/2022   HDL 63 12/12/2022   LDLCALC 68 12/12/2022   TRIG 62 12/12/2022   CHOLHDL 2.3 12/12/2022   Lab Results  Component Value Date   VD25OH 36.8 12/12/2022   VD25OH 57.6 10/28/2019   VD25OH 41.5 07/04/2019   Lab Results  Component Value Date   WBC 9.5 02/01/2022   HGB 12.3 02/01/2022   HCT 38.3 02/01/2022   MCV 90.1 02/01/2022   PLT 287 02/01/2022  Lab Results  Component Value Date   IRON 29 01/25/2022   TIBC 260 01/25/2022   FERRITIN 51 01/25/2022    Attestation Statements:   Reviewed by clinician on day of visit: allergies, medications, problem list, medical history, surgical history, family history, social history, and previous encounter notes.  I have reviewed the above documentation for accuracy and completeness, and I agree with the above. -  Kamel Haven d. Annye Forrey, NP-C

## 2023-01-03 NOTE — Telephone Encounter (Signed)
There is no documentation or message attached.

## 2023-01-04 ENCOUNTER — Telehealth: Payer: Self-pay | Admitting: Pulmonary Disease

## 2023-01-04 NOTE — Telephone Encounter (Signed)
PT still has not rec'd her chin strap. Please see last closed encounters.

## 2023-01-12 ENCOUNTER — Ambulatory Visit: Payer: Self-pay

## 2023-01-12 NOTE — Patient Outreach (Signed)
  Care Coordination   Follow Up Visit Note   01/13/2023 Name: Stacey Page MRN: [id number] DOB: 22-Nov-1950  Stacey Page is a 72 y.o. year old female who sees Mliss Sax, MD for primary care. I spoke with  [name] by phone today.  What matters to the patients health and wellness today?  I consulted with Forrestine, who reported that she is in good health and experiencing increased energy levels. She denies any chest pain, shortness of breath, or swelling. Notably, she has lost some weight; however, she perceives this as a reduction in inches rather than a significant change in pounds. Stacey Page continues to adhere to her prescribed medication regimen. Furthermore, she plans to cruise in the next two weeks. I will schedule a follow-up appointment with her for our routine check-in next month.    Goals Addressed             This Visit's Progress    I want to monitor and manage my CHF       Patient Goals/Self Care Activities: -Patient/Caregiver will take medications as prescribed   -Patient/Caregiver will attend all scheduled provider appointments -Patient/Caregiver will call pharmacy for medication refills 3-7 days in advance of running out of medications -Patient/Caregiver will call provider office for new concerns or questions  -Patient/Caregiver will focus on medication adherence by taking medications as prescribed   -Weigh daily and record (notify MD with 3 lb weight gain over night or 5 lb in a week) -Follow CHF Action Plan- do a check daily -continue to exercise 15 minutes /day split into 5 minute increments Wt Readings from Last 3 Encounters:  01/02/23 (!) 331 lb (150.1 kg)  12/20/22 (!) 336 lb 9.6 oz (152.7 kg)  12/08/22 (!) 331 lb (150.1 kg)           SDOH assessments and interventions completed:  No     Care Coordination Interventions:  Yes, provided   Follow up plan: Follow up call scheduled for 02/09/22  2 pm    Encounter Outcome:   Patient Visit Completed   Juanell Fairly RN, BSN, [facility]  [facility], [facility]  Care Coordinator Phone: [phone]

## 2023-01-13 NOTE — Patient Instructions (Signed)
Visit Information  Thank you for taking time to visit with me today. Please don't hesitate to contact me if I can be of assistance to you.   Following are the goals we discussed today:   Goals Addressed             This Visit's Progress    I want to monitor and manage my CHF       Patient Goals/Self Care Activities: -Patient/Caregiver will take medications as prescribed   -Patient/Caregiver will attend all scheduled provider appointments -Patient/Caregiver will call pharmacy for medication refills 3-7 days in advance of running out of medications -Patient/Caregiver will call provider office for new concerns or questions  -Patient/Caregiver will focus on medication adherence by taking medications as prescribed   -Weigh daily and record (notify MD with 3 lb weight gain over night or 5 lb in a week) -Follow CHF Action Plan- do a check daily -continue to exercise 15 minutes /day split into 5 minute increments Wt Readings from Last 3 Encounters:  01/02/23 (!) 331 lb (150.1 kg)  12/20/22 (!) 336 lb 9.6 oz (152.7 kg)  12/08/22 (!) 331 lb (150.1 kg)           Our next appointment is by telephone on 02/09/22 at 2 pm  Please call the care guide team at [phone] if you need to cancel or reschedule your appointment.   If you are experiencing a Mental Health or Behavioral Health Crisis or need someone to talk to, please call 1-800-273-TALK (toll free, 24 hour hotline)  Patient verbalizes understanding of instructions and care plan provided today and agrees to view in MyChart. Active MyChart status and patient understanding of how to access instructions and care plan via MyChart confirmed with patient.     Juanell Fairly RN, BSN, [facility]  [facility], [facility]  Care Coordinator Phone: [phone]

## 2023-01-14 ENCOUNTER — Other Ambulatory Visit: Payer: Self-pay | Admitting: Family Medicine

## 2023-01-16 ENCOUNTER — Other Ambulatory Visit: Payer: Self-pay

## 2023-01-16 ENCOUNTER — Other Ambulatory Visit (HOSPITAL_COMMUNITY): Payer: Self-pay

## 2023-01-16 MED ORDER — GABAPENTIN 100 MG PO CAPS
100.0000 mg | ORAL_CAPSULE | Freq: Two times a day (BID) | ORAL | 1 refills | Status: DC
  Filled 2023-01-16: qty 180, 90d supply, fill #0
  Filled 2023-04-23: qty 180, 90d supply, fill #1

## 2023-01-19 ENCOUNTER — Other Ambulatory Visit: Payer: Self-pay

## 2023-01-19 DIAGNOSIS — D509 Iron deficiency anemia, unspecified: Secondary | ICD-10-CM

## 2023-01-20 DIAGNOSIS — J9610 Chronic respiratory failure, unspecified whether with hypoxia or hypercapnia: Secondary | ICD-10-CM | POA: Diagnosis not present

## 2023-01-20 DIAGNOSIS — I272 Pulmonary hypertension, unspecified: Secondary | ICD-10-CM | POA: Diagnosis not present

## 2023-01-20 DIAGNOSIS — I509 Heart failure, unspecified: Secondary | ICD-10-CM | POA: Diagnosis not present

## 2023-01-25 DIAGNOSIS — J9610 Chronic respiratory failure, unspecified whether with hypoxia or hypercapnia: Secondary | ICD-10-CM | POA: Diagnosis not present

## 2023-01-25 DIAGNOSIS — I509 Heart failure, unspecified: Secondary | ICD-10-CM | POA: Diagnosis not present

## 2023-01-25 DIAGNOSIS — I272 Pulmonary hypertension, unspecified: Secondary | ICD-10-CM | POA: Diagnosis not present

## 2023-01-26 ENCOUNTER — Other Ambulatory Visit: Payer: No Typology Code available for payment source

## 2023-01-26 ENCOUNTER — Ambulatory Visit: Payer: No Typology Code available for payment source | Admitting: Hematology and Oncology

## 2023-01-29 ENCOUNTER — Other Ambulatory Visit: Payer: Self-pay | Admitting: Family Medicine

## 2023-01-29 DIAGNOSIS — E876 Hypokalemia: Secondary | ICD-10-CM

## 2023-01-30 ENCOUNTER — Other Ambulatory Visit: Payer: Self-pay

## 2023-01-30 ENCOUNTER — Other Ambulatory Visit (HOSPITAL_COMMUNITY): Payer: Self-pay

## 2023-01-30 MED ORDER — POTASSIUM CHLORIDE CRYS ER 10 MEQ PO TBCR
10.0000 meq | EXTENDED_RELEASE_TABLET | Freq: Every day | ORAL | 2 refills | Status: DC
Start: 2023-01-30 — End: 2023-07-28
  Filled 2023-01-30: qty 60, 60d supply, fill #0
  Filled 2023-04-02: qty 60, 60d supply, fill #1
  Filled 2023-04-04: qty 3, 3d supply, fill #1
  Filled 2023-04-04 – 2023-04-06 (×2): qty 60, 60d supply, fill #1
  Filled 2023-05-27: qty 60, 60d supply, fill #2

## 2023-01-31 ENCOUNTER — Other Ambulatory Visit (HOSPITAL_COMMUNITY): Payer: Self-pay

## 2023-02-02 ENCOUNTER — Encounter (INDEPENDENT_AMBULATORY_CARE_PROVIDER_SITE_OTHER): Payer: Self-pay | Admitting: Adult Health

## 2023-02-02 ENCOUNTER — Ambulatory Visit (INDEPENDENT_AMBULATORY_CARE_PROVIDER_SITE_OTHER): Payer: HMO | Admitting: Adult Health

## 2023-02-02 ENCOUNTER — Ambulatory Visit: Payer: HMO | Admitting: Family

## 2023-02-02 VITALS — BP 154/76 | HR 65 | Temp 98.1°F | Ht 68.0 in | Wt 324.0 lb

## 2023-02-02 DIAGNOSIS — N184 Chronic kidney disease, stage 4 (severe): Secondary | ICD-10-CM

## 2023-02-02 DIAGNOSIS — E1122 Type 2 diabetes mellitus with diabetic chronic kidney disease: Secondary | ICD-10-CM | POA: Diagnosis not present

## 2023-02-02 DIAGNOSIS — E669 Obesity, unspecified: Secondary | ICD-10-CM

## 2023-02-02 DIAGNOSIS — E1169 Type 2 diabetes mellitus with other specified complication: Secondary | ICD-10-CM | POA: Diagnosis not present

## 2023-02-02 DIAGNOSIS — Z7985 Long-term (current) use of injectable non-insulin antidiabetic drugs: Secondary | ICD-10-CM | POA: Diagnosis not present

## 2023-02-02 DIAGNOSIS — Z6841 Body Mass Index (BMI) 40.0 and over, adult: Secondary | ICD-10-CM | POA: Diagnosis not present

## 2023-02-02 DIAGNOSIS — E785 Hyperlipidemia, unspecified: Secondary | ICD-10-CM | POA: Diagnosis not present

## 2023-02-02 NOTE — Progress Notes (Signed)
WEIGHT SUMMARY AND BIOMETRICS  Vitals Temp: 98.1 F (36.7 C) BP: (!) 154/76 Pulse Rate: 65 SpO2: 95 %   Anthropometric Measurements Height: 5\' 8"  (1.727 m) Weight: (!) 324 lb (147 kg) BMI (Calculated): 49.28 Weight at Last Visit: 331lb Weight Lost Since Last Visit: 7lb Weight Gained Since Last Visit: 0lb Starting Weight: 344lb Total Weight Loss (lbs): 20 lb (9.072 kg)   Body Composition  Body Fat %: 54.2 % Fat Mass (lbs): 175.8 lbs Muscle Mass (lbs): 141 lbs Total Body Water (lbs): 101.8 lbs Visceral Fat Rating : 22   Other Clinical Data Fasting: no Labs: no Today's Visit #: 55 Starting Date: 03/25/19    Chief Complaint:   OBESITY Stacey Page is here to discuss her progress with her obesity treatment plan. She is on the the Category 2 Plan and states she is following her eating plan approximately 50 % of the time.  She states she is exercising Chair Exercises 10 minutes 3-4 times per week.   Interim History:  Dr. Harding/Cards and Dr. Foster/Nephrology have both agreed that GLP-1 therapy is safe and indicated for [name]. She stared on loading dose Ozempic 0.25mg  once weekly injection on/about 10/13/2022 Ozempic increased to 0.5mg   on 11/10/2022 Denies mass in neck, dysphagia, dyspepsia, persistent hoarseness, abdominal pain, or N/V/C   She recently returned from a [place] cruise 7 days  She has been rationing antihypertensive therapy (hydrALAZINE (APRESOLINE) 50 MG tablet) due to low home supply. Mail order pharmacy delivery arrived today! She will resume Hydralazine TID (has been taking once daily only).  Reviewed Bioimpedance results with pt: Muscle Mass: +1.6 lbs Adipose Mass: -8.8 lbs  Subjective:   1. Hyperlipidemia associated with type 2 diabetes mellitus (HCC) Lipid Panel     Component Value Date/Time   CHOL 144 12/12/2022 1547   TRIG 62 12/12/2022 1547   HDL 63 12/12/2022 1547   CHOLHDL 2.3 12/12/2022 1547   CHOLHDL 3 01/14/2022  0901   VLDL 13.2 01/14/2022 0901   LDLCALC 68 12/12/2022 1547   LABVLDL 13 12/12/2022 1547    She is on  aspirin 81 MG tablet  atorvastatin (LIPITOR) 20 MG tablet  dapagliflozin propanediol (FARXIGA) 5 MG TABS tablet  carvedilol (COREG) 25 MG tablet  losartan (COZAAR) 25 MG tablet  hydrALAZINE (APRESOLINE) 50 MG tablet  furosemide (LASIX) 80 MG tablet  isosorbide dinitrate (ISORDIL) 10 MG tablet  Semaglutide,0.25 or 0.5MG /DOS, (OZEMPIC, 0.25 OR 0.5 MG/DOSE,) 2 MG/3ML SOPN   She denies acute cardiac sx's She is NOT wearing portable O2 at OV today- no resp distress noted.  2. Stage 4 chronic kidney disease (HCC)  Latest Reference Range & Units 07/14/22 13:53 12/12/22 15:47  Creatinine 0.57 - 1.00 mg/dL 0.86 (H) 5.78 (H)  (H): Data is abnormally high  Latest Reference Range & Units 06/24/22 16:44 09/24/22 16:43 12/12/22 15:47  eGFR >59 mL/min/1.73 21.0 (E) 25.0 (E) 25 (L)  (L): Data is abnormally low (E): External lab result  Followed by Nephrology Q3M dapagliflozin propanediol (FARXIGA) 5 MG TABS tablet   3. Type 2 diabetes mellitus with other specified complication, without long-term current use of insulin (HCC) Lab Results  Component Value Date   HGBA1C 6.2 (H) 12/12/2022   HGBA1C 6.5 07/14/2022   HGBA1C 6.5 04/21/2022   aspirin 81 MG tablet  Blood Glucose Monitoring Suppl (ONETOUCH VERIO SYNC SYSTEM) w/Device KIT  atorvastatin (LIPITOR) 20 MG tablet  dapagliflozin propanediol (FARXIGA) 5 MG TABS tablet  carvedilol (COREG) 25 MG tablet  losartan (COZAAR) 25 MG tablet  hydrALAZINE (APRESOLINE) 50 MG tablet  furosemide (LASIX) 80 MG tablet  isosorbide dinitrate (ISORDIL) 10 MG tablet  Semaglutide,0.25 or 0.5MG /DOS, (OZEMPIC, 0.25 OR 0.5 MG/DOSE,) 2 MG/3ML SOPN   Home Fasting CBG - not checking She denies sx's of hypoglycemia She stared on loading dose Ozempic 0.25mg  once weekly injection on/about 10/13/2022 Ozempic increased to 0.5mg   on 11/10/2022 Denies mass in neck,  dysphagia, dyspepsia, persistent hoarseness, abdominal pain, or N/V/C   Assessment/Plan:   1. Hyperlipidemia associated with type 2 diabetes mellitus (HCC) (Primary) Continue to reduce sat fat and increase lean protein in diet  2. Stage 4 chronic kidney disease [facility]) Next Nephrology f/u with Feb 2025  3. Type 2 diabetes mellitus with other specified complication, without long-term current use of insulin (HCC) Continue with weekly Ozempic 0.5mg  injection- she denies need for refill  4. Current BMI 49.82  Stacey Page is currently in the action stage of change. As such, her goal is to continue with weight loss efforts. She has agreed to the Category 2 Plan.   Exercise goals: Older adults should follow the adult guidelines. When older adults cannot meet the adult guidelines, they should be as physically active as their abilities and conditions will allow.  Older adults should do exercises that maintain or improve balance if they are at risk of falling.  Older adults should determine their level of effort for physical activity relative to their level of fitness.  Older adults with chronic conditions should understand whether and how their conditions affect their ability to do regular physical activity safely.  Behavioral modification strategies: increasing lean protein intake, decreasing simple carbohydrates, increasing vegetables, increasing water intake, meal planning and cooking strategies, keeping healthy foods in the home, ways to avoid boredom eating, ways to avoid night time snacking, and planning for success.  Stacey Page has agreed to follow-up with our clinic in 3 weeks. She was informed of the importance of frequent follow-up visits to maximize her success with intensive lifestyle modifications for her multiple health conditions.   Objective:   Blood pressure (!) 154/76, pulse 65, temperature 98.1 F (36.7 C), height 5\' 8"  (1.727 m), weight (!) 324 lb (147 kg), SpO2 95%. Body mass index  is 49.26 kg/m.  General: Cooperative, alert, well developed, in no acute distress. HEENT: Conjunctivae and lids unremarkable. Cardiovascular: Regular rhythm.  Lungs: Normal work of breathing. Neurologic: No focal deficits.   Lab Results  Component Value Date   CREATININE 2.07 (H) 12/12/2022   BUN 30 (H) 12/12/2022   NA 142 12/12/2022   K 4.1 12/12/2022   CL 100 12/12/2022   CO2 28 12/12/2022   Lab Results  Component Value Date   ALT 8 12/12/2022   AST 11 12/12/2022   ALKPHOS 103 12/12/2022   BILITOT 0.4 12/12/2022   Lab Results  Component Value Date   HGBA1C 6.2 (H) 12/12/2022   HGBA1C 6.5 07/14/2022   HGBA1C 6.5 04/21/2022   HGBA1C 6.3 (H) 11/12/2021   HGBA1C 6.0 (A) 07/14/2021   Lab Results  Component Value Date   INSULIN 10.8 12/12/2022   INSULIN 8.6 10/28/2019   INSULIN 9.2 07/04/2019   INSULIN 8.8 03/25/2019   Lab Results  Component Value Date   TSH 0.51 11/28/2018   Lab Results  Component Value Date   CHOL 144 12/12/2022   HDL 63 12/12/2022   LDLCALC 68 12/12/2022   TRIG 62 12/12/2022   CHOLHDL 2.3 12/12/2022   Lab Results  Component Value Date  VD25OH 36.8 12/12/2022   VD25OH 57.6 10/28/2019   VD25OH 41.5 07/04/2019   Lab Results  Component Value Date   WBC 9.5 02/01/2022   HGB 12.3 02/01/2022   HCT 38.3 02/01/2022   MCV 90.1 02/01/2022   PLT 287 02/01/2022   Lab Results  Component Value Date   IRON 29 01/25/2022   TIBC 260 01/25/2022   FERRITIN 51 01/25/2022   Attestation Statements:   Reviewed by clinician on day of visit: allergies, medications, problem list, medical history, surgical history, family history, social history, and previous encounter notes.  Time spent on visit including pre-visit chart review and post-visit care and charting was 28 minutes.   I have reviewed the above documentation for accuracy and completeness, and I agree with the above. -  Kashton Mcartor d. Davian Wollenberg, NP-C

## 2023-02-03 ENCOUNTER — Encounter: Payer: Self-pay | Admitting: Family Medicine

## 2023-02-03 ENCOUNTER — Ambulatory Visit (INDEPENDENT_AMBULATORY_CARE_PROVIDER_SITE_OTHER): Payer: HMO | Admitting: Family Medicine

## 2023-02-03 ENCOUNTER — Inpatient Hospital Stay: Payer: HMO | Attending: Hematology and Oncology

## 2023-02-03 VITALS — BP 126/86 | HR 63 | Temp 97.7°F | Ht 68.0 in | Wt 331.4 lb

## 2023-02-03 DIAGNOSIS — R350 Frequency of micturition: Secondary | ICD-10-CM

## 2023-02-03 DIAGNOSIS — D509 Iron deficiency anemia, unspecified: Secondary | ICD-10-CM | POA: Insufficient documentation

## 2023-02-03 DIAGNOSIS — J069 Acute upper respiratory infection, unspecified: Secondary | ICD-10-CM | POA: Diagnosis not present

## 2023-02-03 LAB — CBC WITH DIFFERENTIAL (CANCER CENTER ONLY)
Abs Immature Granulocytes: 0.01 10*3/uL (ref 0.00–0.07)
Basophils Absolute: 0 10*3/uL (ref 0.0–0.1)
Basophils Relative: 1 %
Eosinophils Absolute: 0.1 10*3/uL (ref 0.0–0.5)
Eosinophils Relative: 3 %
HCT: 42.3 % (ref 36.0–46.0)
Hemoglobin: 13.5 g/dL (ref 12.0–15.0)
Immature Granulocytes: 0 %
Lymphocytes Relative: 30 %
Lymphs Abs: 1.3 10*3/uL (ref 0.7–4.0)
MCH: 29.5 pg (ref 26.0–34.0)
MCHC: 31.9 g/dL (ref 30.0–36.0)
MCV: 92.6 fL (ref 80.0–100.0)
Monocytes Absolute: 0.3 10*3/uL (ref 0.1–1.0)
Monocytes Relative: 8 %
Neutro Abs: 2.5 10*3/uL (ref 1.7–7.7)
Neutrophils Relative %: 58 %
Platelet Count: 206 10*3/uL (ref 150–400)
RBC: 4.57 MIL/uL (ref 3.87–5.11)
RDW: 14.6 % (ref 11.5–15.5)
WBC Count: 4.3 10*3/uL (ref 4.0–10.5)
nRBC: 0 % (ref 0.0–0.2)

## 2023-02-03 LAB — CMP (CANCER CENTER ONLY)
ALT: 9 U/L (ref 0–44)
AST: 11 U/L — ABNORMAL LOW (ref 15–41)
Albumin: 4.3 g/dL (ref 3.5–5.0)
Alkaline Phosphatase: 80 U/L (ref 38–126)
Anion gap: 8 (ref 5–15)
BUN: 25 mg/dL — ABNORMAL HIGH (ref 8–23)
CO2: 32 mmol/L (ref 22–32)
Calcium: 9.5 mg/dL (ref 8.9–10.3)
Chloride: 101 mmol/L (ref 98–111)
Creatinine: 1.92 mg/dL — ABNORMAL HIGH (ref 0.44–1.00)
GFR, Estimated: 28 mL/min — ABNORMAL LOW (ref >60)
Glucose, Bld: 93 mg/dL (ref 70–99)
Potassium: 3.7 mmol/L (ref 3.5–5.1)
Sodium: 141 mmol/L (ref 135–145)
Total Bilirubin: 0.4 mg/dL (ref <1.2)
Total Protein: 8.1 g/dL (ref 6.5–8.1)

## 2023-02-03 LAB — IRON AND IRON BINDING CAPACITY (CC-WL,HP ONLY)
Iron: 51 ug/dL (ref 28–170)
Saturation Ratios: 19 % (ref 10.4–31.8)
TIBC: 266 ug/dL (ref 250–450)
UIBC: 215 ug/dL (ref 148–442)

## 2023-02-03 LAB — RETICULOCYTES
Immature Retic Fract: 18.1 % — ABNORMAL HIGH (ref 2.3–15.9)
RBC.: 4.48 MIL/uL (ref 3.87–5.11)
Retic Count, Absolute: 51.1 10*3/uL (ref 19.0–186.0)
Retic Ct Pct: 1.1 % (ref 0.4–3.1)

## 2023-02-03 LAB — FERRITIN: Ferritin: 70 ng/mL (ref 11–307)

## 2023-02-03 NOTE — Progress Notes (Signed)
Established Patient Office Visit   Subjective:  Patient ID: Stacey Page, female    DOB: 1950/07/07  Age: 72 y.o. MRN: [id number]  Chief Complaint  Patient presents with   Nasal Congestion    Nasal cangestion, ear fullness x 3 days   Urinary Tract Infection    Pt believe she has a UTI x 2 days there has been back pain and frequent urination.     Urinary Tract Infection  Associated symptoms include frequency. Pertinent negatives include no chills or hematuria.   Encounter Diagnoses  Name Primary?   Viral upper respiratory tract infection Yes   Urinary frequency    Presents with a 3-day history of nasal congestion, clear thick rhinorrhea.  Denies fever or chills, headache, pressure, cough, difficulty breathing or wheezing.  2-day history of urinary frequency without dysuria.   Review of Systems  Constitutional: Negative.  Negative for chills and fever.  HENT:  Positive for congestion. Negative for sinus pain.   Eyes:  Negative for blurred vision, discharge and redness.  Respiratory: Negative.  Negative for cough, sputum production, shortness of breath and wheezing.   Cardiovascular: Negative.   Gastrointestinal:  Negative for abdominal pain.  Genitourinary:  Positive for frequency. Negative for dysuria and hematuria.  Musculoskeletal: Negative.  Negative for myalgias.  Skin:  Negative for rash.  Neurological:  Negative for tingling, loss of consciousness and weakness.  Endo/Heme/Allergies:  Negative for polydipsia.     Current Outpatient Medications:    Accu-Chek Softclix Lancets lancets, May check glucose up to 3 times daily., Disp: 100 each, Rfl: 12   albuterol (VENTOLIN HFA) 108 (90 Base) MCG/ACT inhaler, INHALE 1-2 PUFFS into THE lungs EVERY 6 HOURS AS NEEDED FOR WHEEZING AND/OR SHORTNESS OF BREATH, Disp: 6.7 g, Rfl: 3   aspirin 81 MG tablet, Take 1 tablet (81 mg total) by mouth daily with breakfast., Disp: 90 tablet, Rfl: 0   atorvastatin (LIPITOR) 20 MG tablet,  Take 1 tablet (20 mg total) by mouth daily., Disp: 90 tablet, Rfl: 3   Blood Glucose Monitoring Suppl (ONETOUCH VERIO SYNC SYSTEM) w/Device KIT, 1 kit by Does not apply route daily as needed., Disp: 1 kit, Rfl: 0   carvedilol (COREG) 25 MG tablet, Take 1 tablet (25 mg total) by mouth 2 (two) times daily., Disp: 180 tablet, Rfl: 3   cholecalciferol (VITAMIN D3) 25 MCG (1000 UNIT) tablet, Take 2,000 Units by mouth daily., Disp: , Rfl:    dapagliflozin propanediol (FARXIGA) 5 MG TABS tablet, Take 1 tablet (5 mg total) by mouth daily before breakfast., Disp: 90 tablet, Rfl: 2   Docusate Sodium 100 MG capsule, Take 100 mg by mouth 2 (two) times daily., Disp: , Rfl:    ferrous sulfate 325 (65 FE) MG tablet, Take 325 mg by mouth 2 (two) times daily with a meal., Disp: , Rfl:    fluticasone (FLONASE) 50 MCG/ACT nasal spray, Place 1 spray into both nostrils daily., Disp: 48 g, Rfl: 1   furosemide (LASIX) 80 MG tablet, Take 1 tablet (80 mg total) by mouth daily., Disp: 90 tablet, Rfl: 1   gabapentin (NEURONTIN) 100 MG capsule, Take 1 capsule (100 mg total) by mouth 2 (two) times daily., Disp: 180 capsule, Rfl: 1   glucose blood (ONETOUCH VERIO) test strip, Use to monitor blood sugar twice daily as instructed, Disp: 200 each, Rfl: 3   hydrALAZINE (APRESOLINE) 50 MG tablet, Take 1 tablet (50 mg total) by mouth every 8 (eight) hours., Disp: 90 tablet, Rfl: 3  isosorbide dinitrate (ISORDIL) 10 MG tablet, Take 1 tablet (10 mg total) by mouth 3 (three) times daily. Please keep scheduled appointment, Disp: 270 tablet, Rfl: 3   losartan (COZAAR) 25 MG tablet, Take 0.5 tablets (12.5 mg total) by mouth daily., Disp: 45 tablet, Rfl: 3   magic mouthwash (nystatin, lidocaine, diphenhydrAMINE, alum & mag hydroxide) suspension, Take 5 mLs by mouth 3 (three) times daily as needed for mouth pain., Disp: 150 mL, Rfl: 0   montelukast (SINGULAIR) 10 MG tablet, Take 1 tablet (10 mg total) by mouth at bedtime., Disp: 90 tablet,  Rfl: 3   mupirocin ointment (BACTROBAN) 2 %, Apply 1 Application topically 2 (two) times daily for 14 days, Disp: 22 g, Rfl: 3   pantoprazole (PROTONIX) 40 MG tablet, Take 1 tablet (40 mg total) by mouth daily., Disp: 90 tablet, Rfl: 1   potassium chloride (KLOR-CON M) 10 MEQ tablet, Take 1 tablet (10 mEq total) by mouth daily., Disp: 60 tablet, Rfl: 2   Semaglutide,0.25 or 0.5MG /DOS, (OZEMPIC, 0.25 OR 0.5 MG/DOSE,) 2 MG/3ML SOPN, Inject 0.5 mg into the skin once a week., Disp: 3 mL, Rfl: 0   Objective:     BP 126/86 (Cuff Size: Large)   Pulse 63   Temp 97.7 F (36.5 C)   Ht 5\' 8"  (1.727 m)   Wt (!) 331 lb 6.4 oz (150.3 kg)   SpO2 97% Comment: 2 liters of O2  BMI 50.39 kg/m    Physical Exam Constitutional:      General: She is not in acute distress.    Appearance: Normal appearance. She is not ill-appearing, toxic-appearing or diaphoretic.  HENT:     Head: Normocephalic and atraumatic.     Right Ear: External ear normal.     Left Ear: External ear normal.     Mouth/Throat:     Mouth: Mucous membranes are moist.     Pharynx: Oropharynx is clear. No oropharyngeal exudate or posterior oropharyngeal erythema.  Eyes:     General: No scleral icterus.       Right eye: No discharge.        Left eye: No discharge.     Extraocular Movements: Extraocular movements intact.     Conjunctiva/sclera: Conjunctivae normal.     Pupils: Pupils are equal, round, and reactive to light.  Cardiovascular:     Rate and Rhythm: Normal rate and regular rhythm.  Pulmonary:     Effort: Pulmonary effort is normal. No respiratory distress.     Breath sounds: Normal breath sounds.  Abdominal:     General: Bowel sounds are normal.     Tenderness: There is no right CVA tenderness or left CVA tenderness.  Musculoskeletal:     Cervical back: No rigidity or tenderness.  Skin:    General: Skin is warm and dry.  Neurological:     Mental Status: She is alert and oriented to person, place, and time.   Psychiatric:        Mood and Affect: Mood normal.        Behavior: Behavior normal.      Results for orders placed or performed in visit on 02/03/23  Reticulocytes  Result Value Ref Range   Retic Ct Pct 1.1 0.4 - 3.1 %   RBC. 4.48 3.87 - 5.11 MIL/uL   Retic Count, Absolute 51.1 19.0 - 186.0 K/uL   Immature Retic Fract 18.1 (H) 2.3 - 15.9 %  Iron and Iron Binding Capacity ([facility] only)  Result Value Ref Range  Iron 51 28 - 170 ug/dL   TIBC 474 259 - 563 ug/dL   Saturation Ratios 19 10.4 - 31.8 %   UIBC 215 148 - 442 ug/dL  CMP (Cancer Center only)  Result Value Ref Range   Sodium 141 135 - 145 mmol/L   Potassium 3.7 3.5 - 5.1 mmol/L   Chloride 101 98 - 111 mmol/L   CO2 32 22 - 32 mmol/L   Glucose, Bld 93 70 - 99 mg/dL   BUN 25 (H) 8 - 23 mg/dL   Creatinine 8.75 (H) 6.43 - 1.00 mg/dL   Calcium 9.5 8.9 - 32.9 mg/dL   Total Protein 8.1 6.5 - 8.1 g/dL   Albumin 4.3 3.5 - 5.0 g/dL   AST 11 (L) 15 - 41 U/L   ALT 9 0 - 44 U/L   Alkaline Phosphatase 80 38 - 126 U/L   Total Bilirubin 0.4 <1.2 mg/dL   GFR, Estimated 28 (L) >60 mL/min   Anion gap 8 5 - 15  CBC with Differential (Cancer Center Only)  Result Value Ref Range   WBC Count 4.3 4.0 - 10.5 K/uL   RBC 4.57 3.87 - 5.11 MIL/uL   Hemoglobin 13.5 12.0 - 15.0 g/dL   HCT 51.8 84.1 - 66.0 %   MCV 92.6 80.0 - 100.0 fL   MCH 29.5 26.0 - 34.0 pg   MCHC 31.9 30.0 - 36.0 g/dL   RDW 63.0 16.0 - 10.9 %   Platelet Count 206 150 - 400 K/uL   nRBC 0.0 0.0 - 0.2 %   Neutrophils Relative % 58 %   Neutro Abs 2.5 1.7 - 7.7 K/uL   Lymphocytes Relative 30 %   Lymphs Abs 1.3 0.7 - 4.0 K/uL   Monocytes Relative 8 %   Monocytes Absolute 0.3 0.1 - 1.0 K/uL   Eosinophils Relative 3 %   Eosinophils Absolute 0.1 0.0 - 0.5 K/uL   Basophils Relative 1 %   Basophils Absolute 0.0 0.0 - 0.1 K/uL   Immature Granulocytes 0 %   Abs Immature Granulocytes 0.01 0.00 - 0.07 K/uL      The 10-year ASCVD risk score (Arnett DK, et al., 2019)  is: 20.9%    Assessment & Plan:   Viral upper respiratory tract infection  Urinary frequency    Return call Monday if not improving, for Increase Flonase to twice daily..  Unable to void for ordered UA and culture and sensitivity  Mliss Sax, MD

## 2023-02-09 ENCOUNTER — Inpatient Hospital Stay: Payer: HMO

## 2023-02-09 ENCOUNTER — Inpatient Hospital Stay: Payer: HMO | Attending: Hematology and Oncology | Admitting: Adult Health

## 2023-02-09 ENCOUNTER — Ambulatory Visit: Payer: HMO | Admitting: Family Medicine

## 2023-02-09 VITALS — BP 130/60 | HR 60 | Temp 98.4°F | Resp 20 | Wt 328.3 lb

## 2023-02-09 DIAGNOSIS — N184 Chronic kidney disease, stage 4 (severe): Secondary | ICD-10-CM | POA: Insufficient documentation

## 2023-02-09 DIAGNOSIS — E876 Hypokalemia: Secondary | ICD-10-CM | POA: Insufficient documentation

## 2023-02-09 DIAGNOSIS — D509 Iron deficiency anemia, unspecified: Secondary | ICD-10-CM | POA: Diagnosis not present

## 2023-02-09 DIAGNOSIS — G4733 Obstructive sleep apnea (adult) (pediatric): Secondary | ICD-10-CM | POA: Insufficient documentation

## 2023-02-09 DIAGNOSIS — E1142 Type 2 diabetes mellitus with diabetic polyneuropathy: Secondary | ICD-10-CM | POA: Diagnosis not present

## 2023-02-09 DIAGNOSIS — K219 Gastro-esophageal reflux disease without esophagitis: Secondary | ICD-10-CM | POA: Insufficient documentation

## 2023-02-09 DIAGNOSIS — I5032 Chronic diastolic (congestive) heart failure: Secondary | ICD-10-CM | POA: Insufficient documentation

## 2023-02-09 DIAGNOSIS — M1711 Unilateral primary osteoarthritis, right knee: Secondary | ICD-10-CM | POA: Insufficient documentation

## 2023-02-09 DIAGNOSIS — G4730 Sleep apnea, unspecified: Secondary | ICD-10-CM | POA: Insufficient documentation

## 2023-02-09 DIAGNOSIS — I130 Hypertensive heart and chronic kidney disease with heart failure and stage 1 through stage 4 chronic kidney disease, or unspecified chronic kidney disease: Secondary | ICD-10-CM | POA: Diagnosis not present

## 2023-02-09 DIAGNOSIS — E1122 Type 2 diabetes mellitus with diabetic chronic kidney disease: Secondary | ICD-10-CM | POA: Diagnosis not present

## 2023-02-09 DIAGNOSIS — I350 Nonrheumatic aortic (valve) stenosis: Secondary | ICD-10-CM | POA: Insufficient documentation

## 2023-02-09 DIAGNOSIS — J45909 Unspecified asthma, uncomplicated: Secondary | ICD-10-CM | POA: Insufficient documentation

## 2023-02-09 DIAGNOSIS — Z87891 Personal history of nicotine dependence: Secondary | ICD-10-CM | POA: Insufficient documentation

## 2023-02-09 NOTE — Progress Notes (Signed)
 [facility] Cancer Follow up:    Stacey Elsie Sayre, MD [address] [place] [address]   DIAGNOSIS: iron deficiency anemia  SUMMARY OF ONCOLOGIC HISTORY: 1.  On October 14, 2020: Patient with anemia panel showing hematocrit of 32.9, normal B12, normal folic acid , ferritin of 27, iron saturation of 12%, reticulocyte 1.2%.             (A) cologuard normal             (B) Chronic Kidney Disease: stage IV             (C) recommended oral iron and started on Ferrous Sulfate  324mg  BID in 11/2020  CURRENT THERAPY:  INTERVAL HISTORY:  Discussed the use of AI scribe software for clinical note transcription with the patient, who gave verbal consent to proceed.  Stacey Page 73 y.o. female with a history of iron deficiency, presents for a routine follow-up. She has been adhering to the prescribed regimen of ferrous sulfate  twice daily and reports no adverse effects. However, she has been experiencing persistent thirst and dry mouth, which she manages with biotin twice daily. She has also expressed concerns about weight loss and desires to maintain her current body shape.  The patient has been proactive in managing her health, scheduling regular check-ups and maintaining open communication with her healthcare providers. She has a strong support system, including family members who have helped her connect with her current healthcare providers.  The patient has also been making lifestyle changes, including increased physical activity, with a focus on leg exercises and building muscle mass. She has expressed a desire to see changes in her body shape as a result of these efforts. Despite these changes, the patient's iron levels and hemoglobin have remained stable, indicating effective management of her iron deficiency.   Patient Active Problem List   Diagnosis Date Noted   Urinary frequency 02/03/2023   Viral upper respiratory tract infection 12/20/2022    Dysthymia 02/08/2022   Hypokalemia 01/26/2022   Mild pulmonary hypertension (HCC) 01/09/2022   Need for influenza vaccination 12/02/2021   Dysfunction of left eustachian tube 12/02/2021   Allergic rhinitis 12/02/2021   Chronic respiratory failure with hypoxia (HCC) 11/11/2021   Demand ischemia (HCC)    Respiratory acidosis    Acute respiratory failure with hypercapnia (HCC)    Acute on chronic heart failure with preserved ejection fraction (HCC)    CHF (congestive heart failure) (HCC) 10/09/2021   Reactive airway disease 09/21/2021   Viral syndrome 09/21/2021   Hypertensive retinopathy of right eye, grade 1 08/11/2021   Grade 2 hypertensive retinopathy, left 08/11/2021   Nuclear sclerotic cataract of both eyes 08/11/2021   Need for shingles vaccine 07/14/2021   Iron deficiency anemia 01/13/2021   CKD (chronic kidney disease) stage 4, GFR 15-29 ml/min (HCC) 07/09/2019   Type 2 diabetes mellitus with obesity (HCC) 07/02/2019   Polyneuropathy associated with underlying disease (HCC) 11/28/2018   Severe episode of recurrent major depressive disorder, without psychotic features (HCC) 11/28/2018   Type 2 diabetes mellitus with diabetic peripheral angiopathy without gangrene, without long-term current use of insulin  (HCC) 11/28/2018   Poor short term memory 11/28/2018   Moderate aortic stenosis by prior echocardiogram 11/06/2018   Diabetes mellitus (HCC) 03/21/2017   Acute renal failure superimposed on stage 2 chronic kidney disease (HCC) 03/17/2016   Symptomatic anemia 03/17/2016   S/P total knee arthroplasty, right 03/17/2016   Unilateral primary osteoarthritis, right knee    Hyperlipidemia  associated with type 2 diabetes mellitus (HCC) 12/15/2015   Pain in both lower extremities 11/19/2015   Numbness in both hands 08/13/2015   Panic attack 06/09/2015   Right knee pain 05/14/2015   Chronic renal insufficiency 09/16/2014   Pneumothorax on right    Atypical mycobacterium infection     ILD (interstitial lung disease) 2/2 MAI s/p med rxn    Class 3 severe obesity with serious comorbidity and body mass index (BMI) of 50.0 to 59.9 in adult (HCC) 04/03/2014   OSA (obstructive sleep apnea) 02/25/2014   Hypertensive heart disease with chronic diastolic congestive heart failure (HCC) 02/25/2014   DOE (dyspnea on exertion) 02/25/2014   Essential hypertension 02/25/2014    has no known allergies.  MEDICAL HISTORY: Past Medical History:  Diagnosis Date   Anxiety    doesn't take any meds   Arthritis of right knee 03/14/2016   Asthma    Back pain    Chronic diastolic CHF (congestive heart failure) (HCC)    HF with Preserved EF (60-65%) - Grade II Diastolic Dysfunction (Hypertensive Heart Disease). takes Furosemide  daily   Depression    doesn't take meds   Diabetes (HCC)    takes Januvia  daily   Eczema    uses cream as needed   GERD (gastroesophageal reflux disease)    History of bronchitis as a child    HTN (hypertension)    Insomnia    Moderate aortic stenosis by prior echocardiogram 04/2017   10/15/2021: Leanor calcification with moderate AS, MG 23 mmHg   Mycobacterium avium-intracellulare infection (HCC) 2016   OA (osteoarthritis)    Obese    OSA (obstructive sleep apnea) 02/25/2014   wears CPAP at night   Peripheral neuropathy    takes Gabapentin  as needed   Pneumonia    hx of-2010   Seasonal allergies    uses Flonase  daily   Sleep apnea     SURGICAL HISTORY: Past Surgical History:  Procedure Laterality Date   BREAST BIOPSY Right 03/2018   BREAST LUMPECTOMY WITH RADIOACTIVE SEED LOCALIZATION Right 12/28/2018   Procedure: RIGHT BREAST LUMPECTOMY WITH RADIOACTIVE SEED LOCALIZATION;  Surgeon: Curvin Deward MOULD, MD;  Location: [facility] OR;  Service: General;  Laterality: Right;   BREAST SURGERY     CARDIOPULMONARY EXERCISE TEST (CPX)  06/21/2021   (Performed on Coreg  25 mg twice daily): PFTs: FVC 1.76 (60%), FEV1 1.59 (77%), ratio 114% => RESTRICTIVE PHYSIOLOGY; 5  min- 1 mph - 2% grade. 7/10 dyspnea -> pulse ox range during exercise 95 to 98%, low of 90%, 97% on recovery; BP 106/84-160/64; heart rate 64-90 bpm (60% MPHR) -> Chronotropic Incompetence;;MAIN FACTOR for Exercise Limitation = BODY HABITUS w/ Chronotropic Incompetence.   CESAREAN SECTION  x2   COLONOSCOPY     CORONARY CALCIUM  SCORE & CT ANGIOGRAM  09/2017   Coronary Ca Score = 11 (low).  CTA- no obstructive CAD (minimal disease)   JOINT REPLACEMENT     KNEE ARTHROSCOPY Right    LUNG BIOPSY Right 06/10/2014   Procedure: LUNG BIOPSY;  Surgeon: Maude Fleeta Ochoa, MD;  Location: [facility] OR;  Service: Thoracic;  Laterality: Right;   POLYPECTOMY     throat   RIGHT HEART CATH N/A 10/15/2021   Procedure: RIGHT HEART CATH;  Surgeon: Claudene Victory ORN, MD;  Location: [facility] INVASIVE CV LAB;  Service: CV: Mild Pulmonary Hypertension (WHO Group 3).  Mean PAP 28 mmHg, PCWP 13 mmHg.  PVR 2.73 [place].  RAP 6 mmHg.CO-CI 5.5L/min, 2.17 L/min/m.   TOTAL  KNEE ARTHROPLASTY Right 03/14/2016   Procedure: RIGHT TOTAL KNEE ARTHROPLASTY;  Surgeon: Oneil JAYSON Herald, MD;  Location: [facility] OR;  Service: Orthopedics;  Laterality: Right;   TRANSTHORACIC ECHOCARDIOGRAM  10/10/2021   EF 70 to 75%.  Hyperdynamic with no RWMA.  Moderate LVH.  Moderate LA dilation indicating likely diastolic dysfunction although not interpretable.  Moderately elevated PAP with normal RV function, dilated IVC with elevated RAP estimated at 15 mmHg..  Moderate MAC with no MR.  AOV calcification with Moderate AS-mean AvG 23 mmHg.   TRANSTHORACIC ECHOCARDIOGRAM  06/14/2021   EF 60 to 65%.  No RWMA.  Moderate cLVH with GR 1 DD.  Mild LA dilation.  Mild to moderate aortic calcification/stenosis.  Mean AVG 18 mmHg.   VIDEO ASSISTED THORACOSCOPY Right 06/10/2014   Procedure: VIDEO ASSISTED THORACOSCOPY;  Surgeon: Maude Fleeta Ochoa, MD;  Location: [facility] OR;  Service: Thoracic;  Laterality: Right;    SOCIAL HISTORY: Social History   Socioeconomic History   Marital status:  Widowed    Spouse name: Not on file   Number of children: Not on file   Years of education: Not on file   Highest education level: Not on file  Occupational History   Occupation: retired  Tobacco Use   Smoking status: Former    Current packs/day: 0.00    Average packs/day: 0.3 packs/day for 15.0 years (3.8 ttl pk-yrs)    Types: Cigarettes    Start date: 02/07/1965    Quit date: 02/08/1980    Years since quitting: 43.0   Smokeless tobacco: Former  Building Services Engineer status: Never Used  Substance and Sexual Activity   Alcohol use: Yes    Alcohol/week: 0.0 standard drinks of alcohol    Comment: occassional/social/rare   Drug use: Not Currently   Sexual activity: Not Currently  Other Topics Concern   Not on file  Social History Narrative   Not on file   Social Drivers of Health   Financial Resource Strain: Patient Declined (02/02/2023)   Overall Financial Resource Strain (CARDIA)    Difficulty of Paying Living Expenses: Patient declined  Food Insecurity: Patient Declined (02/02/2023)   Hunger Vital Sign    Worried About Running Out of Food in the Last Year: Patient declined    Ran Out of Food in the Last Year: Patient declined  Transportation Needs: Patient Declined (02/02/2023)   PRAPARE - Administrator, Civil Service (Medical): Patient declined    Lack of Transportation (Non-Medical): Patient declined  Physical Activity: Patient Declined (02/02/2023)   Exercise Vital Sign    Days of Exercise per Week: Patient declined    Minutes of Exercise per Session: Patient declined  Stress: Patient Declined (02/02/2023)   [employer] of Occupational Health - Occupational Stress Questionnaire    Feeling of Stress : Patient declined  Social Connections: Unknown (02/02/2023)   Social Connection and Isolation Panel [NHANES]    Frequency of Communication with Friends and Family: Patient declined    Frequency of Social Gatherings with Friends and Family: Patient  declined    Attends Religious Services: Patient declined    Database Administrator or Organizations: Patient declined    Attends Banker Meetings: Not on file    Marital Status: Patient declined  Intimate Partner Violence: Not At Risk (08/17/2021)   Humiliation, Afraid, Rape, and Kick questionnaire    Fear of Current or Ex-Partner: No    Emotionally Abused: No    Physically Abused: No  Sexually Abused: No    FAMILY HISTORY: Family History  Problem Relation Age of Onset   COPD Father    Hypothyroidism Father    Anemia Father        iron deficiency   High blood pressure Father    Alcoholism Father    Cancer Mother 29       pancreatic   High blood pressure Mother    High Cholesterol Mother    Breast cancer Neg Hx     Review of Systems  Constitutional:  Negative for appetite change, chills, fatigue, fever and unexpected weight change.  HENT:   Negative for hearing loss, lump/mass and trouble swallowing.   Eyes:  Negative for eye problems and icterus.  Respiratory:  Negative for chest tightness, cough and shortness of breath.   Cardiovascular:  Negative for chest pain, leg swelling and palpitations.  Gastrointestinal:  Negative for abdominal distention, abdominal pain, constipation, diarrhea, nausea and vomiting.  Endocrine: Negative for hot flashes.  Genitourinary:  Negative for difficulty urinating.   Musculoskeletal:  Negative for arthralgias.  Skin:  Negative for itching and rash.  Neurological:  Negative for dizziness, extremity weakness, headaches and numbness.  Hematological:  Negative for adenopathy. Does not bruise/bleed easily.  Psychiatric/Behavioral:  Negative for depression. The patient is not nervous/anxious.       PHYSICAL EXAMINATION    Vitals:   02/09/23 1421  BP: 130/60  Pulse: 60  Resp: 20  Temp: 98.4 F (36.9 C)  SpO2: 96%    Physical Exam Constitutional:      General: She is not in acute distress.    Appearance: Normal  appearance. She is not toxic-appearing.  HENT:     Head: Normocephalic and atraumatic.     Mouth/Throat:     Mouth: Mucous membranes are moist.     Pharynx: Oropharynx is clear. No oropharyngeal exudate or posterior oropharyngeal erythema.  Eyes:     General: No scleral icterus. Cardiovascular:     Rate and Rhythm: Normal rate and regular rhythm.     Pulses: Normal pulses.     Heart sounds: Normal heart sounds.  Pulmonary:     Effort: Pulmonary effort is normal.     Breath sounds: Normal breath sounds.  Abdominal:     General: Abdomen is flat. Bowel sounds are normal. There is no distension.     Palpations: Abdomen is soft.     Tenderness: There is no abdominal tenderness.  Musculoskeletal:        General: No swelling.     Cervical back: Neck supple.  Lymphadenopathy:     Cervical: No cervical adenopathy.  Skin:    General: Skin is warm and dry.     Findings: No rash.  Neurological:     General: No focal deficit present.     Mental Status: She is alert.  Psychiatric:        Mood and Affect: Mood normal.        Behavior: Behavior normal.     LABORATORY DATA:  CBC    Component Value Date/Time   WBC 4.3 02/03/2023 0828   WBC 9.5 02/01/2022 1631   RBC 4.48 02/03/2023 0829   RBC 4.57 02/03/2023 0828   HGB 13.5 02/03/2023 0828   HCT 42.3 02/03/2023 0828   HCT 32.9 (L) 10/14/2020 1057   PLT 206 02/03/2023 0828   MCV 92.6 02/03/2023 0828   MCH 29.5 02/03/2023 0828   MCHC 31.9 02/03/2023 0828   RDW 14.6 02/03/2023 0828  LYMPHSABS 1.3 02/03/2023 0828   MONOABS 0.3 02/03/2023 0828   EOSABS 0.1 02/03/2023 0828   BASOSABS 0.0 02/03/2023 0828    CMP     Component Value Date/Time   NA 141 02/03/2023 0828   NA 142 12/12/2022 1547   K 3.7 02/03/2023 0828   CL 101 02/03/2023 0828   CO2 32 02/03/2023 0828   GLUCOSE 93 02/03/2023 0828   BUN 25 (H) 02/03/2023 0828   BUN 30 (H) 12/12/2022 1547   CREATININE 1.92 (H) 02/03/2023 0828   CREATININE 1.36 (H) 03/19/2015  1030   CALCIUM  9.5 02/03/2023 0828   PROT 8.1 02/03/2023 0828   PROT 7.4 12/12/2022 1547   ALBUMIN 4.3 02/03/2023 0828   ALBUMIN 4.2 12/12/2022 1547   AST 11 (L) 02/03/2023 0828   ALT 9 02/03/2023 0828   ALKPHOS 80 02/03/2023 0828   BILITOT 0.4 02/03/2023 0828   GFRNONAA 28 (L) 02/03/2023 0828   GFRAA 40 (L) 10/28/2019 0931      ASSESSMENT and THERAPY PLAN:   Iron deficiency anemia Iron Deficiency Anemia Stable on ferrous sulfate  twice daily. No reported side effects. Labs show improved iron levels and stable hemoglobin. -Continue ferrous sulfate  twice daily. -Repeat labs in December 2025. -Annual follow-up appointment in January 2026.  All questions were answered. The patient knows to call the clinic with any problems, questions or concerns. We can certainly see the patient much sooner if necessary.  Total encounter time:20 minutes*in face-to-face visit time, chart review, lab review, care coordination, order entry, and documentation of the encounter time.  Morna Kendall, NP 02/09/23 4:08 PM Medical Oncology and Hematology [facility] [address] [place], [place] [address] Tel. [phone]    Fax. [phone]  *Total Encounter Time as defined by the Centers for Medicare and Medicaid Services includes, in addition to the face-to-face time of a patient visit (documented in the note above) non-face-to-face time: obtaining and reviewing outside history, ordering and reviewing medications, tests or procedures, care coordination (communications with other health care professionals or caregivers) and documentation in the medical record.

## 2023-02-09 NOTE — Assessment & Plan Note (Signed)
 Iron Deficiency Anemia Stable on ferrous sulfate  twice daily. No reported side effects. Labs show improved iron levels and stable hemoglobin. -Continue ferrous sulfate  twice daily. -Repeat labs in December 2025. -Annual follow-up appointment in January 2026.

## 2023-02-10 ENCOUNTER — Ambulatory Visit: Payer: Self-pay

## 2023-02-10 NOTE — Patient Instructions (Signed)
 Visit Information  Thank you for taking time to visit with me today. Please don't hesitate to contact me if I can be of assistance to you.   Following are the goals we discussed today:   Goals Addressed             This Visit's Progress    I want to monitor and manage my CHF       Patient Goals/Self Care Activities: -Patient/Caregiver will take medications as prescribed   -Patient/Caregiver will attend all scheduled provider appointments -Patient/Caregiver will call pharmacy for medication refills 3-7 days in advance of running out of medications -Patient/Caregiver will call provider office for new concerns or questions  -Patient/Caregiver will focus on medication adherence by taking medications as prescribed   -Weigh daily and record (notify MD with 3 lb weight gain over night or 5 lb in a week) -Follow CHF Action Plan- do a check daily -continue to exercise 15 minutes /day split into 5 minute increments Wt Readings from Last 3 Encounters:  02/09/23 (!) 328 lb 4.8 oz (148.9 kg)  02/03/23 (!) 331 lb 6.4 oz (150.3 kg)  02/02/23 (!) 324 lb (147 kg)  -Stay motivated and learn to read food label -maybe see about a cooking class         Our next appointment is by telephone on 03/15/23 at 2 pm  Please call the care guide team at [phone] if you need to cancel or reschedule your appointment.   If you are experiencing a Mental Health or Behavioral Health Crisis or need someone to talk to, please call 1-800-273-TALK (toll free, 24 hour hotline)  Patient verbalizes understanding of instructions and care plan provided today and agrees to view in MyChart. Active MyChart status and patient understanding of how to access instructions and care plan via MyChart confirmed with patient.     Wilbert Diver RN, BSN, [facility] [place]  [facility], [facility]  Care Coordinator Phone: [phone]

## 2023-02-10 NOTE — Patient Outreach (Signed)
  Care Coordination   Follow Up Visit Note   02/10/2023 Name: Stacey Page MRN: [id number] DOB: 1950-03-21  Stacey Page is a 73 y.o. year old female who sees Berneta Elsie Sayre, MD for primary care. I spoke with  Tanecia Innocent by phone today.  What matters to the patients health and wellness today?  [name] reported her overall well-being, noting a stable blood pressure reading of 130/60. She expressed satisfaction with her new oxygen  concentrator.  During her cruise in December, she opted to remain on the ship due to the warm weather at the various ports, although she still enjoyed the experience. [name] denied any occurrences of chest pain, shortness of breath, or edema. Notably, she lost 6 pounds while on the cruise, which has served as a source of motivation for her, as she was able to indulge in delicious food while still achieving weight loss. Consequently, she is interested in learning how to read nutritional labels and make healthier food choices to support her ongoing weight loss efforts. Additionally, she continues to adhere to her prescribed medication regimen.     Goals Addressed             This Visit's Progress    I want to monitor and manage my CHF       Patient Goals/Self Care Activities: -Patient/Caregiver will take medications as prescribed   -Patient/Caregiver will attend all scheduled provider appointments -Patient/Caregiver will call pharmacy for medication refills 3-7 days in advance of running out of medications -Patient/Caregiver will call provider office for new concerns or questions  -Patient/Caregiver will focus on medication adherence by taking medications as prescribed   -Weigh daily and record (notify MD with 3 lb weight gain over night or 5 lb in a week) -Follow CHF Action Plan- do a check daily -continue to exercise 15 minutes /day split into 5 minute increments Wt Readings from Last 3 Encounters:  02/09/23 (!) 328 lb 4.8 oz (148.9  kg)  02/03/23 (!) 331 lb 6.4 oz (150.3 kg)  02/02/23 (!) 324 lb (147 kg)  -Stay motivated and learn to read food label -maybe see about a cooking class         SDOH assessments and interventions completed:  No     Care Coordination Interventions:  Yes, provided   Interventions Today    Flowsheet Row Most Recent Value  Chronic Disease   Chronic disease during today's visit Congestive Heart Failure (CHF)  General Interventions   General Interventions Discussed/Reviewed General Interventions Discussed  Nutrition Interventions   Nutrition Discussed/Reviewed Nutrition Discussed  Pharmacy Interventions   Pharmacy Dicussed/Reviewed Pharmacy Topics Discussed  Safety Interventions   Safety Discussed/Reviewed Safety Discussed        Follow up plan: Follow up call scheduled for 03/15/23  2 pm    Encounter Outcome:  Patient Visit Completed   Wilbert Diver RN, BSN, [facility]  [facility], [facility]  Care Coordinator Phone: [phone]

## 2023-02-13 ENCOUNTER — Other Ambulatory Visit (INDEPENDENT_AMBULATORY_CARE_PROVIDER_SITE_OTHER): Payer: Self-pay | Admitting: Adult Health

## 2023-02-13 DIAGNOSIS — H35032 Hypertensive retinopathy, left eye: Secondary | ICD-10-CM | POA: Diagnosis not present

## 2023-02-13 DIAGNOSIS — H34822 Venous engorgement, left eye: Secondary | ICD-10-CM | POA: Diagnosis not present

## 2023-02-13 DIAGNOSIS — E119 Type 2 diabetes mellitus without complications: Secondary | ICD-10-CM | POA: Diagnosis not present

## 2023-02-13 DIAGNOSIS — H2513 Age-related nuclear cataract, bilateral: Secondary | ICD-10-CM | POA: Diagnosis not present

## 2023-02-13 DIAGNOSIS — H35031 Hypertensive retinopathy, right eye: Secondary | ICD-10-CM | POA: Diagnosis not present

## 2023-02-15 ENCOUNTER — Other Ambulatory Visit (HOSPITAL_COMMUNITY): Payer: Self-pay

## 2023-02-15 DIAGNOSIS — M2041 Other hammer toe(s) (acquired), right foot: Secondary | ICD-10-CM | POA: Diagnosis not present

## 2023-02-15 DIAGNOSIS — E1142 Type 2 diabetes mellitus with diabetic polyneuropathy: Secondary | ICD-10-CM | POA: Diagnosis not present

## 2023-02-15 DIAGNOSIS — M792 Neuralgia and neuritis, unspecified: Secondary | ICD-10-CM | POA: Diagnosis not present

## 2023-02-15 DIAGNOSIS — I739 Peripheral vascular disease, unspecified: Secondary | ICD-10-CM | POA: Diagnosis not present

## 2023-02-15 DIAGNOSIS — L600 Ingrowing nail: Secondary | ICD-10-CM | POA: Diagnosis not present

## 2023-02-15 DIAGNOSIS — B351 Tinea unguium: Secondary | ICD-10-CM | POA: Diagnosis not present

## 2023-02-16 ENCOUNTER — Other Ambulatory Visit (INDEPENDENT_AMBULATORY_CARE_PROVIDER_SITE_OTHER): Payer: Self-pay | Admitting: Adult Health

## 2023-02-18 ENCOUNTER — Other Ambulatory Visit (INDEPENDENT_AMBULATORY_CARE_PROVIDER_SITE_OTHER): Payer: Self-pay | Admitting: Adult Health

## 2023-02-20 ENCOUNTER — Other Ambulatory Visit: Payer: Self-pay

## 2023-02-20 DIAGNOSIS — I509 Heart failure, unspecified: Secondary | ICD-10-CM | POA: Diagnosis not present

## 2023-02-20 DIAGNOSIS — J9610 Chronic respiratory failure, unspecified whether with hypoxia or hypercapnia: Secondary | ICD-10-CM | POA: Diagnosis not present

## 2023-02-20 DIAGNOSIS — I272 Pulmonary hypertension, unspecified: Secondary | ICD-10-CM | POA: Diagnosis not present

## 2023-02-25 ENCOUNTER — Other Ambulatory Visit (HOSPITAL_COMMUNITY): Payer: Self-pay

## 2023-02-25 DIAGNOSIS — I509 Heart failure, unspecified: Secondary | ICD-10-CM | POA: Diagnosis not present

## 2023-02-25 DIAGNOSIS — J9610 Chronic respiratory failure, unspecified whether with hypoxia or hypercapnia: Secondary | ICD-10-CM | POA: Diagnosis not present

## 2023-02-25 DIAGNOSIS — I272 Pulmonary hypertension, unspecified: Secondary | ICD-10-CM | POA: Diagnosis not present

## 2023-02-27 ENCOUNTER — Other Ambulatory Visit: Payer: Self-pay

## 2023-02-27 ENCOUNTER — Other Ambulatory Visit (HOSPITAL_COMMUNITY): Payer: Self-pay

## 2023-02-28 ENCOUNTER — Ambulatory Visit (INDEPENDENT_AMBULATORY_CARE_PROVIDER_SITE_OTHER): Payer: HMO | Admitting: Adult Health

## 2023-02-28 ENCOUNTER — Other Ambulatory Visit: Payer: Self-pay

## 2023-02-28 ENCOUNTER — Other Ambulatory Visit (HOSPITAL_COMMUNITY): Payer: Self-pay

## 2023-02-28 ENCOUNTER — Encounter (INDEPENDENT_AMBULATORY_CARE_PROVIDER_SITE_OTHER): Payer: Self-pay | Admitting: Adult Health

## 2023-02-28 VITALS — BP 100/63 | HR 61 | Ht 68.0 in | Wt 323.0 lb

## 2023-02-28 DIAGNOSIS — E669 Obesity, unspecified: Secondary | ICD-10-CM

## 2023-02-28 DIAGNOSIS — Z7985 Long-term (current) use of injectable non-insulin antidiabetic drugs: Secondary | ICD-10-CM

## 2023-02-28 DIAGNOSIS — N184 Chronic kidney disease, stage 4 (severe): Secondary | ICD-10-CM

## 2023-02-28 DIAGNOSIS — Z6841 Body Mass Index (BMI) 40.0 and over, adult: Secondary | ICD-10-CM

## 2023-02-28 DIAGNOSIS — D508 Other iron deficiency anemias: Secondary | ICD-10-CM | POA: Diagnosis not present

## 2023-02-28 DIAGNOSIS — E1169 Type 2 diabetes mellitus with other specified complication: Secondary | ICD-10-CM | POA: Diagnosis not present

## 2023-02-28 MED ORDER — SEMAGLUTIDE (1 MG/DOSE) 4 MG/3ML ~~LOC~~ SOPN
1.0000 mg | PEN_INJECTOR | SUBCUTANEOUS | 0 refills | Status: DC
  Filled 2023-02-28: qty 9, 84d supply, fill #0
  Filled 2023-02-28: qty 3, 28d supply, fill #0

## 2023-02-28 NOTE — Progress Notes (Addendum)
 WEIGHT SUMMARY AND BIOMETRICS  Vitals Temp: 0 F (-17.8 C) (Unable to get) BP: 100/63 Pulse Rate: 61 SpO2: 97 %   Anthropometric Measurements Height: 5\' 8"  (1.727 m) Weight: (!) 323 lb (146.5 kg) BMI (Calculated): 49.12 Weight at Last Visit: 324lb Weight Lost Since Last Visit: 1lb Weight Gained Since Last Visit: 0 Starting Weight: 344lb Total Weight Loss (lbs): 21 lb (9.526 kg)   Body Composition  Body Fat %: 53.6 % Fat Mass (lbs): 173.2 lbs Muscle Mass (lbs): 142.2 lbs Total Body Water (lbs): 102.4 lbs Visceral Fat Rating : 22   Other Clinical Data Fasting: no Labs: no Today's Visit #: 54 Starting Date: 03/25/19    Chief Complaint:   OBESITY Stacey Page is here to discuss her progress with her obesity treatment plan. She is on the the Category 2 Plan and states she is following her eating plan approximately 50 % of the time. She states she is exercising Chair Exercises 15 minutes 3 times per week.   Interim History:  The last 4 weeks, she has been reducing red meat and increasing white meat protein (chicken, [place])  She reports improved stamina and energy levels. She was able to perform modified push ups at the park with her grandson recently!  Reviewed Bioimpedance results with pt: Muscle Mass: +1.2 lbs Adipose Mass: -2.6 lbs  Subjective:   1. Other iron deficiency anemia 02/09/2023 Oncology OV Notes Stable on ferrous sulfate twice daily. No reported side effects. Labs show improved iron levels and stable hemoglobin. -Continue ferrous sulfate twice daily. -Repeat labs in December 2025. -Annual follow-up appointment in January 2026.  2. Stage 4 chronic kidney disease [facility]) Discussed Labs  Latest Reference Range & Units 02/22/22 09:22 07/14/22 13:53 12/12/22 15:47 02/03/23 08:28  Creatinine 0.44 - 1.00 mg/dL 6.57 (H) 8.46 (H) 9.62 (H) 1.92 (H)  (H): Data is abnormally high  Kidney fx stable  She is on losartan 25mg  daily She is followed by  Nephrology  3. Type 2 diabetes mellitus with other specified complication, without long-term current use of insulin (HCC)  She stared on loading dose Ozempic 0.25mg  once weekly injection on/about 10/13/2022 Ozempic increased to 0.5mg   on 11/10/2022 Has been in this lowest mx dose for 3 months Denies mass in neck, dysphagia, dyspepsia, persistent hoarseness, abdominal pain, or N/V/C  She provided the following fasting CBG readings over the last month: 115,125,117,117,136,135,120,116,119,131 She denies sx's of hypoglycemia She reports increased appetite and subsequent snacking. Discussed risk/benefits of increasing Ozempic from 0.5mg  to 1mg - she is agreeable  Assessment/Plan:   1. Other iron deficiency anemia Continue Ferrous sulfate and diet rich in iron  2. Stage 4 chronic kidney disease (HCC) (Primary) Keep f/u with Nephrology Continue to avoid Nephrotoxic substances  3. Type 2 diabetes mellitus with other specified complication, without long-term current use of insulin (HCC) Refill and INCREASE []   Semaglutide, 1 MG/DOSE, 4 MG/3ML SOPN Inject 1 mg as directed once a week. Dispense: 9 mL, Refills: 0 ordered   4. Current BMI 49.12  Stacey Page is currently in the action stage of change. As such, her goal is to continue with weight loss efforts. She has agreed to the Category 2 Plan.   Exercise goals: Older adults should follow the adult guidelines. When older adults cannot meet the adult guidelines, they should be as physically active as their abilities and conditions will allow.  Older adults should do exercises that maintain or improve balance if they are at risk of falling.  Older  adults should determine their level of effort for physical activity relative to their level of fitness.  Older adults with chronic conditions should understand whether and how their conditions affect their ability to do regular physical activity safely.  Behavioral modification strategies: increasing lean  protein intake, decreasing simple carbohydrates, increasing vegetables, increasing water intake, no skipping meals, and planning for success.  Janicia has agreed to follow-up with our clinic in 4 weeks. She was informed of the importance of frequent follow-up visits to maximize her success with intensive lifestyle modifications for her multiple health conditions.   Objective:   Blood pressure 100/63, pulse 61, height 5\' 8"  (1.727 m), weight (!) 323 lb (146.5 kg), SpO2 97%. Body mass index is 49.11 kg/m.  General: Cooperative, alert, well developed, in no acute distress. HEENT: Conjunctivae and lids unremarkable. Cardiovascular: Regular rhythm.  Lungs: Normal work of breathing. Neurologic: No focal deficits.   Lab Results  Component Value Date   CREATININE 1.92 (H) 02/03/2023   BUN 25 (H) 02/03/2023   NA 141 02/03/2023   K 3.7 02/03/2023   CL 101 02/03/2023   CO2 32 02/03/2023   Lab Results  Component Value Date   ALT 9 02/03/2023   AST 11 (L) 02/03/2023   ALKPHOS 80 02/03/2023   BILITOT 0.4 02/03/2023   Lab Results  Component Value Date   HGBA1C 6.2 (H) 12/12/2022   HGBA1C 6.5 07/14/2022   HGBA1C 6.5 04/21/2022   HGBA1C 6.3 (H) 11/12/2021   HGBA1C 6.0 (A) 07/14/2021   Lab Results  Component Value Date   INSULIN 10.8 12/12/2022   INSULIN 8.6 10/28/2019   INSULIN 9.2 07/04/2019   INSULIN 8.8 03/25/2019   Lab Results  Component Value Date   TSH 0.51 11/28/2018   Lab Results  Component Value Date   CHOL 144 12/12/2022   HDL 63 12/12/2022   LDLCALC 68 12/12/2022   TRIG 62 12/12/2022   CHOLHDL 2.3 12/12/2022   Lab Results  Component Value Date   VD25OH 36.8 12/12/2022   VD25OH 57.6 10/28/2019   VD25OH 41.5 07/04/2019   Lab Results  Component Value Date   WBC 4.3 02/03/2023   HGB 13.5 02/03/2023   HCT 42.3 02/03/2023   MCV 92.6 02/03/2023   PLT 206 02/03/2023   Lab Results  Component Value Date   IRON 51 02/03/2023   TIBC 266 02/03/2023    FERRITIN 70 02/03/2023   Attestation Statements:   Reviewed by clinician on day of visit: allergies, medications, problem list, medical history, surgical history, family history, social history, and previous encounter notes.  I have reviewed the above documentation for accuracy and completeness, and I agree with the above. -  Dalton Molesworth d. Eveny Anastas, NP-C

## 2023-03-03 ENCOUNTER — Ambulatory Visit (INDEPENDENT_AMBULATORY_CARE_PROVIDER_SITE_OTHER): Payer: HMO | Admitting: Adult Health

## 2023-03-03 ENCOUNTER — Ambulatory Visit: Payer: HMO

## 2023-03-03 ENCOUNTER — Encounter: Payer: Self-pay | Admitting: Adult Health

## 2023-03-03 VITALS — BP 128/60 | HR 69 | Ht 68.0 in | Wt 328.0 lb

## 2023-03-03 DIAGNOSIS — J45909 Unspecified asthma, uncomplicated: Secondary | ICD-10-CM | POA: Insufficient documentation

## 2023-03-03 DIAGNOSIS — I5032 Chronic diastolic (congestive) heart failure: Secondary | ICD-10-CM

## 2023-03-03 DIAGNOSIS — J4520 Mild intermittent asthma, uncomplicated: Secondary | ICD-10-CM | POA: Diagnosis not present

## 2023-03-03 DIAGNOSIS — J9611 Chronic respiratory failure with hypoxia: Secondary | ICD-10-CM

## 2023-03-03 DIAGNOSIS — J849 Interstitial pulmonary disease, unspecified: Secondary | ICD-10-CM | POA: Diagnosis not present

## 2023-03-03 DIAGNOSIS — G4733 Obstructive sleep apnea (adult) (pediatric): Secondary | ICD-10-CM

## 2023-03-03 DIAGNOSIS — J4530 Mild persistent asthma, uncomplicated: Secondary | ICD-10-CM

## 2023-03-03 NOTE — Assessment & Plan Note (Signed)
Appears stable.- PFT and HRCT 2023 were stable  Chest xray today   Plan  Patient Instructions  Chest xray today.  Continue on Oxygen 2l/m with activity, goal is to keep Oxygen >88-90% Continue on CPAP At bedtime with Oxygen 2l/m  CPAP download requested.  Activity as tolerated  Albuterol inhaler As needed   Continue on Singulair and Flonase daily  Follow up with Dr. Wynona Neat or Samson Ralph NP -30 min slot . In 6 months and As needed   Please contact office for sooner follow up if symptoms do not improve or worsen or seek emergency care   '

## 2023-03-03 NOTE — Assessment & Plan Note (Signed)
Reported good compliance on CPAP.  Has perceived benefit.  Order for chinstrap sent to DME company.  Continue on CPAP.  CPAP download has been requested  Plan  Patient Instructions  Chest xray today.  Continue on Oxygen 2l/m with activity, goal is to keep Oxygen >88-90% Continue on CPAP At bedtime with Oxygen 2l/m  CPAP download requested.  Activity as tolerated  Albuterol inhaler As needed   Continue on Singulair and Flonase daily  Follow up with Dr. Wynona Neat or Jasmynn Pfalzgraf NP -30 min slot . In 6 months and As needed   Please contact office for sooner follow up if symptoms do not improve or worsen or seek emergency care

## 2023-03-03 NOTE — Progress Notes (Signed)
@Patient  ID: Stacey Page, female    DOB: November 30, 1950, 73 y.o.   MRN: [id number]  Chief Complaint  Patient presents with   Follow-up    Referring provider: Mliss Sax  HPI: 73 year old female former smoker followed for asthma, sleep apnea, mild pulmonary hypertension, history of mild interstitial lung disease changes secondary to MAI (VATS biopsy May 2016 bronchiolitis with scattered nonnecrotizing granulomatous inflammation with atypical Mycobacterium) Medical history significant for aortic valve stenosis, diastolic dysfunction, chronic kidney disease and diabetes   TEST/EVENTS :  PFT 03/12/14 >> FEV1 2.05 (88%), FEV1% 87, TLC 4.14 (75%), DLCO 52%, no BD VATS lung bx 06/10/14 >> bronchiolitis with non necrotizing granuloma and AFB PFT 01/30/22 >> FEV1 1.60 (62%), FEV1% 85, TLC 3.83 (69%), DLCO 62%   Chest Imaging:  HRCT chest 03/20/14 >> multiple small nodules, patchy GGO, mild BTX, air trapping Cardiac CT 10/06/17 >> scattered areas of GGO w/o change from 2016 HRCT chest 01/31/22 >> mild fibrosis pattern with relatively heterogeneous, asymmetric distribution of peribronchovascular and subpleural ground-glass, architectural distortion, and scattered areas of subpleural bronchiolectasis conspicuously in the anterior left upper lobe, stable subpleural nodules, air trapping (no change from 2016)   Sleep Tests:  HST 02/12/14 >> AHI 59.3, SaO2 low 67% Auto CPAP 01/25/22 to 02/23/22 >> used on 30 of 30 nights with average 6 hrs 23 min.  Average AHI 7.9 with median CPAP 10 and 95 th percentile CPAP 11 cm H2O   Cardiac Tests:  Echo 10/10/21 >> EF 70 to 75%, mod LVH, mod LA dilation, mod AS  03/03/2023 Follow up: Asthma, OSA, ILD  Patient returns for 72-month follow-up.  Patient has underlying sleep apnea is on nocturnal CPAP.  Patient says she wears her CPAP every single night.  Feels that she benefits from CPAP with decreased daytime sleepiness.  Usually gets that about 6 to 7  hours.  Would like a order for a chinstrap to go to the DME company.  She is currently using nasal pillows but feels like her mouth comes open intermittently.  CPAP download has been requested.  Patient remains on oxygen 2 L during the daytime and 2 L with her CPAP.  Patient says she has been doing well.  Does go occasionally without her oxygen.  Today in office O2 saturations at rest were 96%.  Patient does have mild asthma.  Remains on Singulair and Flonase daily.  Says overall her breathing's been doing good.  She denies any flare of cough or wheezing.  Patient has a history of interstitial lung changes on CT chest.  She had a VATS biopsy in 2016 with bronchiolitis with scattered nonnecrotizing granulomatous inflammation with atypical Mycobacterium.  PFTs in 2023 showed stable lung function with moderate restriction and a decreased diffusing capacity. High-resolution CT chest December 2023 showed stable mild pulmonary fibrosis. Patient is overall breathing is doing okay.  She does get short of breath if she has to walk a prolonged period of time but no flare of cough or wheezing.  No Known Allergies  Immunization History  Administered Date(s) Administered   Fluad Quad(high Dose 65+) 11/28/2018, 01/29/2020   Influenza Split 11/21/2016   Influenza, High Dose Seasonal PF 12/02/2021   Influenza,inj,Quad PF,6+ Mos 01/12/2015, 11/19/2015   PFIZER(Purple Top)SARS-COV-2 Vaccination 03/31/2019, 04/23/2019, 03/21/2020   Pneumococcal Conjugate-13 01/19/2017   Pneumococcal Polysaccharide-23 01/12/2015, 05/14/2020   Zoster Recombinant(Shingrix) 07/14/2021, 12/02/2021    Past Medical History:  Diagnosis Date   Anxiety    doesn't take any meds  Arthritis of right knee 03/14/2016   Asthma    Back pain    Chronic diastolic CHF (congestive heart failure) (HCC)    HF with Preserved EF (60-65%) - Grade II Diastolic Dysfunction (Hypertensive Heart Disease). takes Furosemide daily   Depression     doesn't take meds   Diabetes (HCC)    takes Januvia daily   Eczema    uses cream as needed   GERD (gastroesophageal reflux disease)    History of bronchitis as a child    HTN (hypertension)    Insomnia    Moderate aortic stenosis by prior echocardiogram 04/2017   10/15/2021: Echo-ao calcification with moderate AS, MG 23 mmHg   Mycobacterium avium-intracellulare infection (HCC) 2016   OA (osteoarthritis)    Obese    OSA (obstructive sleep apnea) 02/25/2014   wears CPAP at night   Peripheral neuropathy    takes Gabapentin as needed   Pneumonia    hx of-2010   Seasonal allergies    uses Flonase daily   Sleep apnea     Tobacco History: Social History   Tobacco Use  Smoking Status Former   Current packs/day: 0.00   Average packs/day: 0.3 packs/day for 15.0 years (3.8 ttl pk-yrs)   Types: Cigarettes   Start date: 02/07/1965   Quit date: 02/08/1980   Years since quitting: 43.0  Smokeless Tobacco Former   Counseling given: Not Answered   Outpatient Medications Prior to Visit  Medication Sig Dispense Refill   Accu-Chek Softclix Lancets lancets May check glucose up to 3 times daily. 100 each 12   albuterol (VENTOLIN HFA) 108 (90 Base) MCG/ACT inhaler INHALE 1-2 PUFFS into THE lungs EVERY 6 HOURS AS NEEDED FOR WHEEZING AND/OR SHORTNESS OF BREATH 6.7 g 3   aspirin 81 MG tablet Take 1 tablet (81 mg total) by mouth daily with breakfast. 90 tablet 0   atorvastatin (LIPITOR) 20 MG tablet Take 1 tablet (20 mg total) by mouth daily. 90 tablet 3   Blood Glucose Monitoring Suppl (ONETOUCH VERIO SYNC SYSTEM) w/Device KIT 1 kit by Does not apply route daily as needed. 1 kit 0   carvedilol (COREG) 25 MG tablet Take 1 tablet (25 mg total) by mouth 2 (two) times daily. 180 tablet 3   cholecalciferol (VITAMIN D3) 25 MCG (1000 UNIT) tablet Take 2,000 Units by mouth daily.     dapagliflozin propanediol (FARXIGA) 5 MG TABS tablet Take 1 tablet (5 mg total) by mouth daily before breakfast. 90 tablet 2    Docusate Sodium 100 MG capsule Take 100 mg by mouth 2 (two) times daily.     ferrous sulfate 325 (65 FE) MG tablet Take 325 mg by mouth 2 (two) times daily with a meal.     fluticasone (FLONASE) 50 MCG/ACT nasal spray Place 1 spray into both nostrils daily. 48 g 1   furosemide (LASIX) 80 MG tablet Take 1 tablet (80 mg total) by mouth daily. 90 tablet 1   gabapentin (NEURONTIN) 100 MG capsule Take 1 capsule (100 mg total) by mouth 2 (two) times daily. 180 capsule 1   glucose blood (ONETOUCH VERIO) test strip Use to monitor blood sugar twice daily as instructed 200 each 3   hydrALAZINE (APRESOLINE) 50 MG tablet Take 1 tablet (50 mg total) by mouth every 8 (eight) hours. 90 tablet 3   isosorbide dinitrate (ISORDIL) 10 MG tablet Take 1 tablet (10 mg total) by mouth 3 (three) times daily. Please keep scheduled appointment 270 tablet 3  losartan (COZAAR) 25 MG tablet Take 0.5 tablets (12.5 mg total) by mouth daily. 45 tablet 3   magic mouthwash (nystatin, lidocaine, diphenhydrAMINE, alum & mag hydroxide) suspension Take 5 mLs by mouth 3 (three) times daily as needed for mouth pain. 150 mL 0   montelukast (SINGULAIR) 10 MG tablet Take 1 tablet (10 mg total) by mouth at bedtime. 90 tablet 3   mupirocin ointment (BACTROBAN) 2 % Apply 1 Application topically 2 (two) times daily for 14 days 22 g 3   pantoprazole (PROTONIX) 40 MG tablet Take 1 tablet (40 mg total) by mouth daily. 90 tablet 1   potassium chloride (KLOR-CON M) 10 MEQ tablet Take 1 tablet (10 mEq total) by mouth daily. 60 tablet 2   Semaglutide, 1 MG/DOSE, 4 MG/3ML SOPN Inject 1 mg as directed once a week. 9 mL 0   No facility-administered medications prior to visit.     Review of Systems:   Constitutional:   No  weight loss, night sweats,  Fevers, chills, +fatigue, or  lassitude.  HEENT:   No headaches,  Difficulty swallowing,  Tooth/dental problems, or  Sore throat,                No sneezing, itching, ear ache, nasal congestion,  post nasal drip,   CV:  No chest pain,  Orthopnea, PND, swelling in lower extremities, anasarca, dizziness, palpitations, syncope.   GI  No heartburn, indigestion, abdominal pain, nausea, vomiting, diarrhea, change in bowel habits, loss of appetite, bloody stools.   Resp:  No chest wall deformity  Skin: no rash or lesions.  GU: no dysuria, change in color of urine, no urgency or frequency.  No flank pain, no hematuria   MS:  No joint pain or swelling.  No decreased range of motion.  No back pain.    Physical Exam  BP 128/60 (BP Location: Right Arm, Patient Position: Sitting, Cuff Size: Large)   Pulse 69   Ht 5\' 8"  (1.727 m)   Wt (!) 328 lb (148.8 kg)   SpO2 96%   BMI 49.87 kg/m   GEN: A/Ox3; pleasant , NAD, well nourished    HEENT:  [place]/AT,  NOSE-clear, THROAT-clear, no lesions, no postnasal drip or exudate noted.   NECK:  Supple w/ fair ROM; no JVD; normal carotid impulses w/o bruits; no thyromegaly or nodules palpated; no lymphadenopathy.    RESP  Clear  P & A; w/o, wheezes/ rales/ or rhonchi. no accessory muscle use, no dullness to percussion  CARD:  RRR, Gr 1/6 SM  no peripheral edema, pulses intact, no cyanosis or clubbing.  GI:   Soft & nt; nml bowel sounds; no organomegaly or masses detected.   Musco: Warm bil, no deformities or joint swelling noted.   Neuro: alert, no focal deficits noted.    Skin: Warm, no lesions or rashes    Lab Results:  CBC   BNP   Imaging: No results found.  Administration History     None          Latest Ref Rng & Units 01/20/2022    9:47 AM 03/12/2014    8:43 AM  PFT Results  FVC-Pre L 1.87  2.37   FVC-Predicted Pre % 55  80   FVC-Post L 2.04  2.35   FVC-Predicted Post % 60  79   Pre FEV1/FVC % % 85  87   Post FEV1/FCV % % 75  87   FEV1-Pre L 1.60  2.05   FEV1-Predicted Pre %  62  88   FEV1-Post L 1.52  2.05   DLCO uncorrected ml/min/mmHg 13.54  14.84   DLCO UNC% % 62  52   DLCO corrected ml/min/mmHg 13.54     DLCO COR %Predicted % 62    DLVA Predicted % 91  72   TLC L 3.83  4.14   TLC % Predicted % 69  75   RV % Predicted % 71  72     No results found for: "NITRICOXIDE"      Assessment & Plan:   OSA (obstructive sleep apnea) Reported good compliance on CPAP.  Has perceived benefit.  Order for chinstrap sent to DME company.  Continue on CPAP.  CPAP download has been requested  Plan  Patient Instructions  Chest xray today.  Continue on Oxygen 2l/m with activity, goal is to keep Oxygen >88-90% Continue on CPAP At bedtime with Oxygen 2l/m  CPAP download requested.  Activity as tolerated  Albuterol inhaler As needed   Continue on Singulair and Flonase daily  Follow up with Dr. Wynona Neat or Nitisha Civello NP -30 min slot . In 6 months and As needed   Please contact office for sooner follow up if symptoms do not improve or worsen or seek emergency care     ILD (interstitial lung disease) 2/2 MAI s/p med rxn Appears stable.- PFT and HRCT 2023 were stable  Chest xray today   Plan  Patient Instructions  Chest xray today.  Continue on Oxygen 2l/m with activity, goal is to keep Oxygen >88-90% Continue on CPAP At bedtime with Oxygen 2l/m  CPAP download requested.  Activity as tolerated  Albuterol inhaler As needed   Continue on Singulair and Flonase daily  Follow up with Dr. Wynona Neat or Yashvi Jasinski NP -30 min slot . In 6 months and As needed   Please contact office for sooner follow up if symptoms do not improve or worsen or seek emergency care   '  Asthma Mild allergic asthma.  Continue on Singulair and Flonase daily.  Plan  Patient Instructions  Chest xray today.  Continue on Oxygen 2l/m with activity, goal is to keep Oxygen >88-90% Continue on CPAP At bedtime with Oxygen 2l/m  CPAP download requested.  Activity as tolerated  Albuterol inhaler As needed   Continue on Singulair and Flonase daily  Follow up with Dr. Wynona Neat or Atilano Covelli NP -30 min slot . In 6 months and As needed    Please contact office for sooner follow up if symptoms do not improve or worsen or seek emergency care     CHF (congestive heart failure) (HCC) Appears euvolemic on exam continue follow-up with cardiology  Chronic respiratory failure with hypoxia (HCC) Continue on oxygen 2 L with activity and at bedtime with CPAP     Rubye Oaks, NP 03/03/2023

## 2023-03-03 NOTE — Assessment & Plan Note (Signed)
Appears euvolemic on exam continue follow-up with cardiology.

## 2023-03-03 NOTE — Assessment & Plan Note (Signed)
Continue on oxygen 2 L with activity and at bedtime with CPAP

## 2023-03-03 NOTE — Patient Instructions (Addendum)
Chest xray today.  Continue on Oxygen 2l/m with activity, goal is to keep Oxygen >88-90% Continue on CPAP At bedtime with Oxygen 2l/m  CPAP download requested.  Activity as tolerated  Albuterol inhaler As needed   Continue on Singulair and Flonase daily  Follow up with Dr. Wynona Neat or Clover Feehan NP -30 min slot . In 6 months and As needed   Please contact office for sooner follow up if symptoms do not improve or worsen or seek emergency care

## 2023-03-03 NOTE — Assessment & Plan Note (Signed)
Mild allergic asthma.  Continue on Singulair and Flonase daily.  Plan  Patient Instructions  Chest xray today.  Continue on Oxygen 2l/m with activity, goal is to keep Oxygen >88-90% Continue on CPAP At bedtime with Oxygen 2l/m  CPAP download requested.  Activity as tolerated  Albuterol inhaler As needed   Continue on Singulair and Flonase daily  Follow up with Dr. Wynona Neat or Carmyn Hamm NP -30 min slot . In 6 months and As needed   Please contact office for sooner follow up if symptoms do not improve or worsen or seek emergency care

## 2023-03-04 ENCOUNTER — Other Ambulatory Visit: Payer: Self-pay | Admitting: Family Medicine

## 2023-03-05 ENCOUNTER — Other Ambulatory Visit (HOSPITAL_COMMUNITY): Payer: Self-pay

## 2023-03-06 ENCOUNTER — Other Ambulatory Visit: Payer: Self-pay

## 2023-03-06 ENCOUNTER — Other Ambulatory Visit (HOSPITAL_COMMUNITY): Payer: Self-pay

## 2023-03-06 MED ORDER — PANTOPRAZOLE SODIUM 40 MG PO TBEC
40.0000 mg | DELAYED_RELEASE_TABLET | Freq: Every day | ORAL | 1 refills | Status: DC
  Filled 2023-03-06: qty 90, 90d supply, fill #0
  Filled 2023-05-27: qty 90, 90d supply, fill #1

## 2023-03-07 ENCOUNTER — Other Ambulatory Visit (HOSPITAL_COMMUNITY): Payer: Self-pay

## 2023-03-07 DIAGNOSIS — M792 Neuralgia and neuritis, unspecified: Secondary | ICD-10-CM | POA: Diagnosis not present

## 2023-03-07 DIAGNOSIS — L600 Ingrowing nail: Secondary | ICD-10-CM | POA: Diagnosis not present

## 2023-03-07 DIAGNOSIS — M2041 Other hammer toe(s) (acquired), right foot: Secondary | ICD-10-CM | POA: Diagnosis not present

## 2023-03-07 DIAGNOSIS — B351 Tinea unguium: Secondary | ICD-10-CM | POA: Diagnosis not present

## 2023-03-07 DIAGNOSIS — E1142 Type 2 diabetes mellitus with diabetic polyneuropathy: Secondary | ICD-10-CM | POA: Diagnosis not present

## 2023-03-07 DIAGNOSIS — I739 Peripheral vascular disease, unspecified: Secondary | ICD-10-CM | POA: Diagnosis not present

## 2023-03-19 ENCOUNTER — Other Ambulatory Visit: Payer: Self-pay

## 2023-03-23 DIAGNOSIS — J9610 Chronic respiratory failure, unspecified whether with hypoxia or hypercapnia: Secondary | ICD-10-CM | POA: Diagnosis not present

## 2023-03-23 DIAGNOSIS — I509 Heart failure, unspecified: Secondary | ICD-10-CM | POA: Diagnosis not present

## 2023-03-23 DIAGNOSIS — I272 Pulmonary hypertension, unspecified: Secondary | ICD-10-CM | POA: Diagnosis not present

## 2023-03-28 ENCOUNTER — Other Ambulatory Visit (HOSPITAL_COMMUNITY): Payer: Self-pay

## 2023-03-28 DIAGNOSIS — N184 Chronic kidney disease, stage 4 (severe): Secondary | ICD-10-CM | POA: Diagnosis not present

## 2023-03-28 DIAGNOSIS — J9610 Chronic respiratory failure, unspecified whether with hypoxia or hypercapnia: Secondary | ICD-10-CM | POA: Diagnosis not present

## 2023-03-28 DIAGNOSIS — I509 Heart failure, unspecified: Secondary | ICD-10-CM | POA: Diagnosis not present

## 2023-03-28 DIAGNOSIS — I272 Pulmonary hypertension, unspecified: Secondary | ICD-10-CM | POA: Diagnosis not present

## 2023-03-29 ENCOUNTER — Encounter (INDEPENDENT_AMBULATORY_CARE_PROVIDER_SITE_OTHER): Payer: Self-pay | Admitting: Physician Assistant

## 2023-03-29 ENCOUNTER — Ambulatory Visit (INDEPENDENT_AMBULATORY_CARE_PROVIDER_SITE_OTHER): Payer: HMO | Admitting: Physician Assistant

## 2023-03-29 VITALS — BP 135/56 | HR 56 | Temp 97.9°F | Ht 68.0 in | Wt 322.0 lb

## 2023-03-29 DIAGNOSIS — Z7984 Long term (current) use of oral hypoglycemic drugs: Secondary | ICD-10-CM

## 2023-03-29 DIAGNOSIS — E1122 Type 2 diabetes mellitus with diabetic chronic kidney disease: Secondary | ICD-10-CM

## 2023-03-29 DIAGNOSIS — I129 Hypertensive chronic kidney disease with stage 1 through stage 4 chronic kidney disease, or unspecified chronic kidney disease: Secondary | ICD-10-CM | POA: Diagnosis not present

## 2023-03-29 DIAGNOSIS — N184 Chronic kidney disease, stage 4 (severe): Secondary | ICD-10-CM

## 2023-03-29 DIAGNOSIS — J069 Acute upper respiratory infection, unspecified: Secondary | ICD-10-CM

## 2023-03-29 DIAGNOSIS — Z6841 Body Mass Index (BMI) 40.0 and over, adult: Secondary | ICD-10-CM | POA: Diagnosis not present

## 2023-03-29 DIAGNOSIS — E1169 Type 2 diabetes mellitus with other specified complication: Secondary | ICD-10-CM

## 2023-03-29 DIAGNOSIS — D509 Iron deficiency anemia, unspecified: Secondary | ICD-10-CM

## 2023-03-29 DIAGNOSIS — Z7985 Long-term (current) use of injectable non-insulin antidiabetic drugs: Secondary | ICD-10-CM | POA: Diagnosis not present

## 2023-03-29 DIAGNOSIS — D508 Other iron deficiency anemias: Secondary | ICD-10-CM

## 2023-03-29 DIAGNOSIS — E559 Vitamin D deficiency, unspecified: Secondary | ICD-10-CM | POA: Diagnosis not present

## 2023-03-29 NOTE — Progress Notes (Signed)
SUBJECTIVE: Discussed the use of AI scribe software for clinical note transcription with the patient, who gave verbal consent to proceed.  Chief Complaint: Obesity  Interim History: She is down 1 lb since last visit.  Down 22 lbs overall.  Ozempic increased to 1mg  weekly last visit and has done well .   Stacey Page is here to discuss her progress with her obesity treatment plan. She is on the Category 2 Plan and states she is following her eating plan approximately 50 % of the time. She states she is exercising chair exercises 15-20 minutes 3 times per week. Stacey Page "Stacey Page" is a 73 year old female with obesity who presents for follow-up of her obesity treatment plan.  She is following up on her obesity treatment plan and wants to make more progress. She typically eats twice a day, supplementing with dried fruits to avoid sweets, and monitors her intake of ice cream. She recently consumed a significant amount of mozzarella cheese, which she believes may have contributed to her respiratory symptoms. Her current medications include Ozempic 1 mg weekly, which she finds effective in reducing cravings.  She has a productive cough that began on Sunday night, accompanied by chills but no fever. The chills have resolved. The cough produces white phlegm at times, which she associates with her recent mozzarella cheese intake. No increased shortness of breath, and her oxygen levels are stable. She remains on her usual chronic oxygen by nasal cannula without feeling more short of breath currently. She reports drinking tea seems to help her symptoms.  She experienced a temporary loss of voice, which is now improving.  She describes an episode of feeling like her leg was 'electrified,' which woke her from sleep. This sensation has occurred before but not recently until Sunday or Monday. She associates this with her Ozempic use, although it does not occur immediately after taking the medication. No nausea  or vomiting or other side effects with Ozempic.  She checks her blood sugars but is having trouble with her test strips recently. No issues with her current medications and maintains her usual energy level despite respiratory symptoms. Her medical history includes type 2 diabetes, stage 4 chronic kidney disease, iron deficiency, vitamin D deficiency, and lung disease.  Plan for fasting labs next OV.   OBJECTIVE: Visit Diagnoses: Problem List Items Addressed This Visit     Diabetes mellitus (HCC) - Primary   Iron deficiency anemia   Other Visit Diagnoses       Upper respiratory tract infection, unspecified type         Vitamin D deficiency         Morbid obesity (HCC), starting BMI 55.52         Current BMI 49.12         Obesity Follow-up for obesity treatment. Reports no significant appetite but struggles with cravings, particularly for sweets. Currently on Ozempic 1 mg weekly with occasional severe leg pain, possibly related to the medication. Discussed potential B12 deficiency due to Protonix and plan for fasting labs at next visit. - Continue Ozempic 1 mg weekly - Encourage increased physical activity, including chair exercises - Plan for fasting labs at next visit to check B12 levels  Type 2 Diabetes Mellitus with CKD- Stage 4 Type 2 diabetes, on Farxiga 5 mg daily and Ozempic 1 mg weekly. Needs new blood sugar test strips. Lab Results  Component Value Date   HGBA1C 6.2 (H) 12/12/2022   HGBA1C 6.5 07/14/2022   HGBA1C  6.5 04/21/2022   Lab Results  Component Value Date   MICROALBUR 1.4 05/14/2020   LDLCALC 68 12/12/2022   CREATININE 1.92 (H) 02/03/2023    - Continue Ozempic 1 mg weekly. Does not need refill currently.  Continue Farxiga for HF and Type 2 diabetes management She is working  on nutrition plan to decrease simple carbohydrates, increase lean proteins and exercise to promote weight loss and improve glycemic control .    Hypertension Hypertension, on  losartan, Isordil, Apresoline, Lasix, and carvedilol. Blood pressure was slightly elevated at 135/56 mmHg during the visit. She reports she did not take all of her medications yet today and was also anxious about making it to the appointment. Follows regularly with cardiology and PCP. Monitor closely.  - Continue current antihypertensive medications Continue to work on nutrition plan to promote weight loss and improve BP control.   Iron Deficiency On ferrous sulfate 325 mg twice daily for iron deficiency. No new symptoms reported.CKD stage 4.  - Continue ferrous sulfate 325 mg twice daily  Vitamin D Deficiency On Vitamin D3 2000 units daily for vitamin D deficiency. No new symptoms reported. No N/V or muscle weakness with vitamin D supplement.  Last vitamin D Lab Results  Component Value Date   VD25OH 36.8 12/12/2022    - Continue Vitamin D3 2000 units daily Low vitamin D levels can be associated with adiposity and may result in leptin resistance and weight gain. Also associated with fatigue.  Currently on vitamin D supplementation without any adverse effects such as nausea, vomiting or muscle weakness.   Respiratory Symptoms Reports productive cough, congestion starting two days ago. No fever. CHills first day, but none rest of week. History of lung disease, on inhalers, Flonase, and Singulair. Advised to seek urgent care if symptoms worsen and consider chest x-ray if symptoms persist or worsen. She has an appointment to see her PCP tomorrow.  - Advise to seek urgent care if symptoms worsen - Consider chest x-ray if symptoms persist or worsen  General Health Maintenance Discussed the importance of monitoring B12 levels due to potential absorption issues from Protonix. - Plan for fasting labs at next visit to check B12 levels  Follow-up - Follow up with regular doctor tomorrow - Plan for fasting labs at next visit.  Vitals Temp: 97.9 F (36.6 C) BP: (!) 135/56 Pulse Rate: (!)  56 SpO2: 99 %   Anthropometric Measurements Height: 5\' 8"  (1.727 m) Weight: (!) 322 lb (146.1 kg) BMI (Calculated): 48.97 Weight at Last Visit: 323lb Weight Lost Since Last Visit: 1lb Weight Gained Since Last Visit: 0 Starting Weight: 344lb Total Weight Loss (lbs): 22 lb (9.979 kg)   Body Composition  Body Fat %: 54.5 % Fat Mass (lbs): 175.8 lbs Muscle Mass (lbs): 139.4 lbs Total Body Water (lbs): 103 lbs Visceral Fat Rating : 22   Other Clinical Data Fasting: yes Labs: no Today's Visit #: 32 Starting Date: 03/25/19     ASSESSMENT AND PLAN:  Diet: Stacey Page is currently in the action stage of change. As such, her goal is to continue with weight loss efforts. She has agreed to Category 2 Plan.  Exercise: Zehra has been instructed to continue exercising as is for weight loss and overall health benefits.   Behavior Modification:  We discussed the following Behavioral Modification Strategies today: increasing lean protein intake, decreasing simple carbohydrates, increasing vegetables, increase H2O intake, increase high fiber foods, avoiding temptations, and planning for success. We discussed various medication options to help [facility]  with her weight loss efforts and we both agreed to continue Ozempic 1 mg weekly and continue other medications as prescribed and continue to work on nutritional and behavioral strategies to promote weight loss.  .  Return in about 4 weeks (around 04/26/2023).Marland Kitchen She was informed of the importance of frequent follow up visits to maximize her success with intensive lifestyle modifications for her multiple health conditions.  Attestation Statements:   Reviewed by clinician on day of visit: allergies, medications, problem list, medical history, surgical history, family history, social history, and previous encounter notes.   Time spent on visit including pre-visit chart review and post-visit care and charting was 37 minutes.    Helios Kohlmann, PA-C

## 2023-03-30 ENCOUNTER — Encounter: Payer: Self-pay | Admitting: Family Medicine

## 2023-03-30 ENCOUNTER — Other Ambulatory Visit (HOSPITAL_COMMUNITY): Payer: Self-pay

## 2023-03-30 ENCOUNTER — Ambulatory Visit: Payer: Self-pay

## 2023-03-30 ENCOUNTER — Telehealth: Payer: Self-pay

## 2023-03-30 ENCOUNTER — Ambulatory Visit (INDEPENDENT_AMBULATORY_CARE_PROVIDER_SITE_OTHER): Payer: HMO | Admitting: Family Medicine

## 2023-03-30 ENCOUNTER — Other Ambulatory Visit: Payer: Self-pay

## 2023-03-30 VITALS — BP 118/72 | HR 75 | Temp 98.1°F | Resp 18 | Wt 328.8 lb

## 2023-03-30 DIAGNOSIS — J4521 Mild intermittent asthma with (acute) exacerbation: Secondary | ICD-10-CM

## 2023-03-30 DIAGNOSIS — J22 Unspecified acute lower respiratory infection: Secondary | ICD-10-CM

## 2023-03-30 MED ORDER — DOXYCYCLINE HYCLATE 100 MG PO TABS
100.0000 mg | ORAL_TABLET | Freq: Two times a day (BID) | ORAL | 0 refills | Status: AC
Start: 2023-03-30 — End: 2023-04-09
  Filled 2023-03-30 (×2): qty 20, 10d supply, fill #0

## 2023-03-30 MED ORDER — PREDNISONE 10 MG PO TABS
10.0000 mg | ORAL_TABLET | Freq: Two times a day (BID) | ORAL | 0 refills | Status: AC
Start: 2023-03-30 — End: 2023-04-06
  Filled 2023-03-30 (×2): qty 14, 7d supply, fill #0

## 2023-03-30 MED ORDER — HYDROCODONE BIT-HOMATROP MBR 5-1.5 MG/5ML PO SOLN
5.0000 mL | Freq: Four times a day (QID) | ORAL | 0 refills | Status: DC | PRN
  Filled 2023-03-30 (×2): qty 120, 6d supply, fill #0

## 2023-03-30 NOTE — Telephone Encounter (Signed)
Spoke to Pt pharmacy regarding medications. Pt called clinic stating medication weren't sent to pharmacy. Spoke to pharmacy tech and got clarification that medications are being filled and should be ready for pick up. I also called Pt to informed; no answer left VM.

## 2023-03-30 NOTE — Patient Outreach (Signed)
Care Coordination   Follow Up Visit Note   03/30/2023 Name: Stacey Page MRN: [id number] DOB: Dec 12, 1950  Stacey Page is a 73 y.o. year old female who sees Mliss Sax, MD for primary care. I spoke with  [name] by phone today.  What matters to the patients health and wellness today?  Stacey Page presented to the clinic today with symptoms indicative of a cold; notably, she does not exhibit a fever. Her recorded temperature was 98.53F, her blood pressure measured at 118/72, and her weight was 328 lbs. She continues to take 1 milligram of [place] and [place]. The A1C level recorded three months ago was 6.2. She mentioned experiencing mild shortness of breath and a dry mouth during her visit. In response to her symptoms, she was prescribed Hydromet 5-1.5 ml/mg, as well as Prednisone 10 mg and Doxycycline 100 mg. A two-week follow-up appointment is scheduled to evaluate her progress and ensure her condition is improving.      Goals Addressed             This Visit's Progress    I want to monitor and manage my CHF       Patient Goals/Self Care Activities: -Patient/Caregiver will take medications as prescribed   -Patient/Caregiver will attend all scheduled provider appointments -Patient/Caregiver will call pharmacy for medication refills 3-7 days in advance of running out of medications -Patient/Caregiver will call provider office for new concerns or questions  -Patient/Caregiver will focus on medication adherence by taking medications as prescribed   -Weigh daily and record (notify MD with 3 lb weight gain over night or 5 lb in a week) -Follow CHF Action Plan- do a check daily -continue to exercise 15 minutes /day split into 5 minute increments Wt Readings from Last 3 Encounters:  03/30/23 (!) 328 lb 12.8 oz (149.1 kg)  03/29/23 (!) 322 lb (146.1 kg)  03/03/23 (!) 328 lb (148.8 kg)  -Stay motivated and learn to read food label -maybe see about a cooking  class         SDOH assessments and interventions completed:  No     Care Coordination Interventions:  Yes, provided   Interventions Today    Flowsheet Row Most Recent Value  Chronic Disease   Chronic disease during today's visit Congestive Heart Failure (CHF)  General Interventions   General Interventions Discussed/Reviewed General Interventions Discussed, General Interventions Reviewed, Sick Day Rules  Pharmacy Interventions   Pharmacy Dicussed/Reviewed Pharmacy Topics Discussed, Medications and their functions  Safety Interventions   Safety Discussed/Reviewed Safety Discussed        Follow up plan: Follow up call scheduled for 04/14/23  1 pm    Encounter Outcome:  Patient Visit Completed   Juanell Fairly RN, BSN, [facility]  [facility], [facility]  Care Coordinator Phone: [phone]

## 2023-03-30 NOTE — Patient Instructions (Signed)
Visit Information  Thank you for taking time to visit with me today. Please don't hesitate to contact me if I can be of assistance to you.   Following are the goals we discussed today:   Goals Addressed             This Visit's Progress    I want to monitor and manage my CHF       Patient Goals/Self Care Activities: -Patient/Caregiver will take medications as prescribed   -Patient/Caregiver will attend all scheduled provider appointments -Patient/Caregiver will call pharmacy for medication refills 3-7 days in advance of running out of medications -Patient/Caregiver will call provider office for new concerns or questions  -Patient/Caregiver will focus on medication adherence by taking medications as prescribed   -Weigh daily and record (notify MD with 3 lb weight gain over night or 5 lb in a week) -Follow CHF Action Plan- do a check daily -continue to exercise 15 minutes /day split into 5 minute increments Wt Readings from Last 3 Encounters:  03/30/23 (!) 328 lb 12.8 oz (149.1 kg)  03/29/23 (!) 322 lb (146.1 kg)  03/03/23 (!) 328 lb (148.8 kg)  -Stay motivated and learn to read food label -maybe see about a cooking class         Our next appointment is by telephone on 04/14/23 at 1 pm  Please call the care guide team at [phone] if you need to cancel or reschedule your appointment.   If you are experiencing a Mental Health or Behavioral Health Crisis or need someone to talk to, please call 1-800-273-TALK (toll free, 24 hour hotline)  Patient verbalizes understanding of instructions and care plan provided today and agrees to view in MyChart. Active MyChart status and patient understanding of how to access instructions and care plan via MyChart confirmed with patient.     Juanell Fairly RN, BSN, [facility]  [facility], [facility]  Care Coordinator Phone: [phone]

## 2023-03-30 NOTE — Progress Notes (Signed)
Established Patient Office Visit   Subjective:  Patient ID: Stacey Page, female    DOB: 1950/05/08  Age: 73 y.o. MRN: [id number]  Chief Complaint  Patient presents with   Acute Visit    PT C/O of congestion for 4 days. PT used tea/lemon for symptoms.     HPI Encounter Diagnoses  Name Primary?   Lower resp. tract infection Yes   Mild intermittent reactive airway disease with acute exacerbation    4-day history of cough with wheezing and tightness in the chest productive of beige phlegm.  She denies fevers chills, sore throat, nasal congestion, body aches and pains or nausea and vomiting.  History of chronic diastolic heart failure with hypoxia and oxygen dependency.   Review of Systems  Constitutional: Negative.  Negative for chills, fever and malaise/fatigue.  HENT: Negative.    Eyes:  Negative for blurred vision, discharge and redness.  Respiratory:  Positive for cough, sputum production and wheezing.   Cardiovascular: Negative.  Negative for chest pain and leg swelling.  Gastrointestinal:  Negative for abdominal pain.  Genitourinary: Negative.   Musculoskeletal: Negative.  Negative for joint pain and myalgias.  Skin:  Negative for rash.  Neurological:  Negative for tingling, loss of consciousness and weakness.  Endo/Heme/Allergies:  Negative for polydipsia.     Current Outpatient Medications:    Accu-Chek Softclix Lancets lancets, May check glucose up to 3 times daily., Disp: 100 each, Rfl: 12   albuterol (VENTOLIN HFA) 108 (90 Base) MCG/ACT inhaler, INHALE 1-2 PUFFS into THE lungs EVERY 6 HOURS AS NEEDED FOR WHEEZING AND/OR SHORTNESS OF BREATH, Disp: 6.7 g, Rfl: 3   aspirin 81 MG tablet, Take 1 tablet (81 mg total) by mouth daily with breakfast., Disp: 90 tablet, Rfl: 0   atorvastatin (LIPITOR) 20 MG tablet, Take 1 tablet (20 mg total) by mouth daily., Disp: 90 tablet, Rfl: 3   Blood Glucose Monitoring Suppl (ONETOUCH VERIO SYNC SYSTEM) w/Device KIT, 1 kit by Does  not apply route daily as needed., Disp: 1 kit, Rfl: 0   carvedilol (COREG) 25 MG tablet, Take 1 tablet (25 mg total) by mouth 2 (two) times daily., Disp: 180 tablet, Rfl: 3   cholecalciferol (VITAMIN D3) 25 MCG (1000 UNIT) tablet, Take 2,000 Units by mouth daily., Disp: , Rfl:    dapagliflozin propanediol (FARXIGA) 5 MG TABS tablet, Take 1 tablet (5 mg total) by mouth daily before breakfast., Disp: 90 tablet, Rfl: 2   Docusate Sodium 100 MG capsule, Take 100 mg by mouth 2 (two) times daily., Disp: , Rfl:    doxycycline (VIBRA-TABS) 100 MG tablet, Take 1 tablet (100 mg total) by mouth 2 (two) times daily for 10 days., Disp: 20 tablet, Rfl: 0   ferrous sulfate 325 (65 FE) MG tablet, Take 325 mg by mouth 2 (two) times daily with a meal., Disp: , Rfl:    fluticasone (FLONASE) 50 MCG/ACT nasal spray, Place 1 spray into both nostrils daily., Disp: 48 g, Rfl: 1   furosemide (LASIX) 80 MG tablet, Take 1 tablet (80 mg total) by mouth daily., Disp: 90 tablet, Rfl: 1   gabapentin (NEURONTIN) 100 MG capsule, Take 1 capsule (100 mg total) by mouth 2 (two) times daily., Disp: 180 capsule, Rfl: 1   glucose blood (ONETOUCH VERIO) test strip, Use to monitor blood sugar twice daily as instructed, Disp: 200 each, Rfl: 3   hydrALAZINE (APRESOLINE) 50 MG tablet, Take 1 tablet (50 mg total) by mouth every 8 (eight) hours., Disp: 90  tablet, Rfl: 3   HYDROcodone bit-homatropine (HYDROMET) 5-1.5 MG/5ML syrup, Take 5 mLs by mouth every 6 (six) hours as needed for cough., Disp: 120 mL, Rfl: 0   isosorbide dinitrate (ISORDIL) 10 MG tablet, Take 1 tablet (10 mg total) by mouth 3 (three) times daily. Please keep scheduled appointment, Disp: 270 tablet, Rfl: 3   losartan (COZAAR) 25 MG tablet, Take 0.5 tablets (12.5 mg total) by mouth daily., Disp: 45 tablet, Rfl: 3   magic mouthwash (nystatin, lidocaine, diphenhydrAMINE, alum & mag hydroxide) suspension, Take 5 mLs by mouth 3 (three) times daily as needed for mouth pain., Disp:  150 mL, Rfl: 0   montelukast (SINGULAIR) 10 MG tablet, Take 1 tablet (10 mg total) by mouth at bedtime., Disp: 90 tablet, Rfl: 3   mupirocin ointment (BACTROBAN) 2 %, Apply 1 Application topically 2 (two) times daily for 14 days, Disp: 22 g, Rfl: 3   pantoprazole (PROTONIX) 40 MG tablet, Take 1 tablet (40 mg total) by mouth daily., Disp: 90 tablet, Rfl: 1   potassium chloride (KLOR-CON M) 10 MEQ tablet, Take 1 tablet (10 mEq total) by mouth daily., Disp: 60 tablet, Rfl: 2   predniSONE (DELTASONE) 10 MG tablet, Take 1 tablet (10 mg total) by mouth 2 (two) times daily with a meal for 7 days., Disp: 14 tablet, Rfl: 0   Semaglutide, 1 MG/DOSE, 4 MG/3ML SOPN, Inject 1 mg as directed once a week., Disp: 9 mL, Rfl: 0   Objective:     BP 118/72 (BP Location: Left Arm, Patient Position: Sitting, Cuff Size: Large)   Pulse 75   Temp 98.1 F (36.7 C) (Temporal)   Resp 18   Wt (!) 328 lb 12.8 oz (149.1 kg)   SpO2 97%   BMI 49.99 kg/m  BP Readings from Last 3 Encounters:  03/30/23 118/72  03/29/23 (!) 135/56  03/03/23 128/60   Wt Readings from Last 3 Encounters:  03/30/23 (!) 328 lb 12.8 oz (149.1 kg)  03/29/23 (!) 322 lb (146.1 kg)  03/03/23 (!) 328 lb (148.8 kg)      Physical Exam Constitutional:      General: She is not in acute distress.    Appearance: Normal appearance. She is not ill-appearing, toxic-appearing or diaphoretic.  HENT:     Head: Normocephalic and atraumatic.     Right Ear: External ear normal.     Left Ear: External ear normal.     Mouth/Throat:     Mouth: Mucous membranes are moist.     Pharynx: Oropharynx is clear. No oropharyngeal exudate or posterior oropharyngeal erythema.  Eyes:     General: No scleral icterus.       Right eye: No discharge.        Left eye: No discharge.     Extraocular Movements: Extraocular movements intact.     Conjunctiva/sclera: Conjunctivae normal.     Pupils: Pupils are equal, round, and reactive to light.  Cardiovascular:      Rate and Rhythm: Normal rate and regular rhythm.  Pulmonary:     Effort: Pulmonary effort is normal. No respiratory distress.     Breath sounds: Normal breath sounds. No wheezing, rhonchi or rales.  Abdominal:     General: Bowel sounds are normal.     Tenderness: There is no abdominal tenderness. There is no guarding.  Musculoskeletal:     Cervical back: No rigidity or tenderness.  Skin:    General: Skin is warm and dry.  Neurological:  Mental Status: She is alert and oriented to person, place, and time.  Psychiatric:        Mood and Affect: Mood normal.        Behavior: Behavior normal.      No results found for any visits on 03/30/23.    The 10-year ASCVD risk score (Arnett DK, et al., 2019) is: 18.8%    Assessment & Plan:   Lower resp. tract infection -     Doxycycline Hyclate; Take 1 tablet (100 mg total) by mouth 2 (two) times daily for 10 days.  Dispense: 20 tablet; Refill: 0 -     HYDROcodone Bit-Homatrop MBr; Take 5 mLs by mouth every 6 (six) hours as needed for cough.  Dispense: 120 mL; Refill: 0  Mild intermittent reactive airway disease with acute exacerbation -     predniSONE; Take 1 tablet (10 mg total) by mouth 2 (two) times daily with a meal for 7 days.  Dispense: 14 tablet; Refill: 0    Return in about 1 week (around 04/06/2023), or if symptoms worsen or fail to improve.    Mliss Sax, MD

## 2023-03-31 DIAGNOSIS — E1122 Type 2 diabetes mellitus with diabetic chronic kidney disease: Secondary | ICD-10-CM | POA: Diagnosis not present

## 2023-03-31 DIAGNOSIS — I5032 Chronic diastolic (congestive) heart failure: Secondary | ICD-10-CM | POA: Diagnosis not present

## 2023-03-31 DIAGNOSIS — N2581 Secondary hyperparathyroidism of renal origin: Secondary | ICD-10-CM | POA: Diagnosis not present

## 2023-03-31 DIAGNOSIS — N184 Chronic kidney disease, stage 4 (severe): Secondary | ICD-10-CM | POA: Diagnosis not present

## 2023-03-31 DIAGNOSIS — D631 Anemia in chronic kidney disease: Secondary | ICD-10-CM | POA: Diagnosis not present

## 2023-03-31 DIAGNOSIS — I129 Hypertensive chronic kidney disease with stage 1 through stage 4 chronic kidney disease, or unspecified chronic kidney disease: Secondary | ICD-10-CM | POA: Diagnosis not present

## 2023-03-31 DIAGNOSIS — N189 Chronic kidney disease, unspecified: Secondary | ICD-10-CM | POA: Diagnosis not present

## 2023-04-02 ENCOUNTER — Other Ambulatory Visit: Payer: Self-pay | Admitting: Cardiology

## 2023-04-02 ENCOUNTER — Other Ambulatory Visit: Payer: Self-pay | Admitting: Family Medicine

## 2023-04-02 DIAGNOSIS — E1169 Type 2 diabetes mellitus with other specified complication: Secondary | ICD-10-CM

## 2023-04-03 ENCOUNTER — Other Ambulatory Visit: Payer: Self-pay

## 2023-04-03 ENCOUNTER — Telehealth: Payer: Self-pay | Admitting: Cardiology

## 2023-04-03 ENCOUNTER — Other Ambulatory Visit (HOSPITAL_COMMUNITY): Payer: Self-pay

## 2023-04-03 ENCOUNTER — Encounter: Payer: Self-pay | Admitting: Pharmacist

## 2023-04-03 MED ORDER — ATORVASTATIN CALCIUM 20 MG PO TABS
20.0000 mg | ORAL_TABLET | Freq: Every day | ORAL | 3 refills | Status: AC
  Filled 2023-04-03 – 2023-04-04 (×2): qty 90, 90d supply, fill #0
  Filled 2023-07-08: qty 90, 90d supply, fill #1
  Filled 2023-10-21: qty 90, 90d supply, fill #2
  Filled 2024-01-17: qty 90, 90d supply, fill #3

## 2023-04-03 MED ORDER — HYDRALAZINE HCL 50 MG PO TABS
50.0000 mg | ORAL_TABLET | Freq: Three times a day (TID) | ORAL | 3 refills | Status: DC
  Filled 2023-04-03: qty 270, 90d supply, fill #0
  Filled 2023-04-04 – 2023-05-27 (×2): qty 90, 30d supply, fill #0
  Filled 2023-07-08: qty 90, 30d supply, fill #1
  Filled 2023-08-05: qty 90, 30d supply, fill #2
  Filled 2023-09-02: qty 90, 30d supply, fill #3

## 2023-04-03 MED ORDER — HYDRALAZINE HCL 100 MG PO TABS
100.0000 mg | ORAL_TABLET | Freq: Three times a day (TID) | ORAL | 1 refills | Status: DC
  Filled 2023-04-03: qty 270, 90d supply, fill #0
  Filled 2023-04-04: qty 15, 5d supply, fill #0

## 2023-04-03 NOTE — Telephone Encounter (Signed)
 Called pt to inform her that she needed to contact her kidney doctor to have them send in the increased medication, because that doctor increased medication and not Dr. Herbie Baltimore. Pt stated that she would contact her kidney doctor and have them send in the medication. I advise the pt that if she has any other problems, questions or concerns, to give our office a call back. Pt verbalized understanding.

## 2023-04-03 NOTE — Telephone Encounter (Signed)
*  STAT* If patient is at the pharmacy, call can be transferred to refill team.   1. Which medications need to be refilled? (please list name of each medication and dose if known)   hydrALAZINE (APRESOLINE) 50 MG tablet   2. Would you like to learn more about the convenience, safety, & potential cost savings by using the [facility]?   3. Are you open to using the [facility] (Type [facility]. ).  4. Which pharmacy/location (including street and city if local pharmacy) is medication to be sent to?  [place] [facility]   5. Do they need a 30 day or 90 day supply?   90 day  Patient stated she is completely out of this medication.  Patient noted her kidney doctor increased her medication.

## 2023-04-04 ENCOUNTER — Other Ambulatory Visit: Payer: Self-pay

## 2023-04-04 ENCOUNTER — Other Ambulatory Visit (HOSPITAL_COMMUNITY): Payer: Self-pay

## 2023-04-04 DIAGNOSIS — E1142 Type 2 diabetes mellitus with diabetic polyneuropathy: Secondary | ICD-10-CM | POA: Diagnosis not present

## 2023-04-04 DIAGNOSIS — I739 Peripheral vascular disease, unspecified: Secondary | ICD-10-CM | POA: Diagnosis not present

## 2023-04-04 DIAGNOSIS — M792 Neuralgia and neuritis, unspecified: Secondary | ICD-10-CM | POA: Diagnosis not present

## 2023-04-04 DIAGNOSIS — M2041 Other hammer toe(s) (acquired), right foot: Secondary | ICD-10-CM | POA: Diagnosis not present

## 2023-04-04 DIAGNOSIS — B351 Tinea unguium: Secondary | ICD-10-CM | POA: Diagnosis not present

## 2023-04-04 DIAGNOSIS — L600 Ingrowing nail: Secondary | ICD-10-CM | POA: Diagnosis not present

## 2023-04-04 MED ORDER — HYDRALAZINE HCL 50 MG PO TABS
100.0000 mg | ORAL_TABLET | Freq: Three times a day (TID) | ORAL | 1 refills | Status: DC
  Filled 2023-04-04: qty 180, 30d supply, fill #0
  Filled 2023-04-04 (×2): qty 540, 90d supply, fill #0

## 2023-04-06 ENCOUNTER — Other Ambulatory Visit: Payer: Self-pay

## 2023-04-06 ENCOUNTER — Other Ambulatory Visit (HOSPITAL_COMMUNITY): Payer: Self-pay

## 2023-04-14 ENCOUNTER — Ambulatory Visit: Payer: Self-pay

## 2023-04-14 NOTE — Patient Instructions (Signed)
 Visit Information  Thank you for taking time to visit with me today. Please don't hesitate to contact me if I can be of assistance to you.   Following are the goals we discussed today:   Goals Addressed             This Visit's Progress    I want to monitor and manage my CHF       Patient Goals/Self Care Activities: -Patient/Caregiver will take medications as prescribed   -Patient/Caregiver will attend all scheduled provider appointments -Patient/Caregiver will call pharmacy for medication refills 3-7 days in advance of running out of medications -Patient/Caregiver will call provider office for new concerns or questions  -Patient/Caregiver will focus on medication adherence by taking medications as prescribed   -Weigh daily and record (notify MD with 3 lb weight gain over night or 5 lb in a week) -Follow CHF Action Plan- do a check daily -continue to exercise 15 minutes /day split into 5 minute increments Wt Readings from Last 3 Encounters:  03/30/23 (!) 328 lb 12.8 oz (149.1 kg)  03/29/23 (!) 322 lb (146.1 kg)  03/03/23 (!) 328 lb (148.8 kg)          Our next appointment is by telephone on 05/12/23 at 2 pm  Please call the care guide team at [phone] if you need to cancel or reschedule your appointment.   If you are experiencing a Mental Health or Behavioral Health Crisis or need someone to talk to, please call 1-800-273-TALK (toll free, 24 hour hotline)  Patient verbalizes understanding of instructions and care plan provided today and agrees to view in MyChart. Active MyChart status and patient understanding of how to access instructions and care plan via MyChart confirmed with patient.     Juanell Fairly RN, BSN, [facility]  [facility], [facility]  Care Coordinator Phone: [phone]

## 2023-04-14 NOTE — Patient Outreach (Signed)
 Care Coordination   Follow Up Visit Note   04/14/2023 Name: Stacey Page MRN: [id number] DOB: 13-Oct-1950  Stacey Page is a 73 y.o. year old female who sees Mliss Sax, MD for primary care. I spoke with  [name] by phone today.  What matters to the patients health and wellness today?  Stacey Page is currently in fair condition; however, she reported experiencing difficulty concentrating. Recently, she received notification that she will need to commence dialysis. Following her attendance at a related educational class, she expressed feeling overwhelmed by the volume of information presented. I reassured her that this response was typical, emphasized the importance of acquiring as much knowledge as possible, and encouraged her to pose any questions she may have.  Stacey Page conveyed that she has already begun to prepare her diet and is contemplating the various options available for receiving dialysis. She is eager to conduct further research on these alternatives. Additionally, she noted that she has not experienced any chest pain, shortness of breath, or swelling. She continues to adhere to her medication regimen as prescribed.  I advised her to consult with her physician or physicians regarding the management of her medications on dialysis days, as the blood filtering process may remove certain medications from her system. She acknowledged the importance of this discussion and agreed that we would explore it in greater detail at her next appointment.        Goals Addressed             This Visit's Progress    I want to monitor and manage my CHF       Patient Goals/Self Care Activities: -Patient/Caregiver will take medications as prescribed   -Patient/Caregiver will attend all scheduled provider appointments -Patient/Caregiver will call pharmacy for medication refills 3-7 days in advance of running out of medications -Patient/Caregiver will call provider  office for new concerns or questions  -Patient/Caregiver will focus on medication adherence by taking medications as prescribed   -Weigh daily and record (notify MD with 3 lb weight gain over night or 5 lb in a week) -Follow CHF Action Plan- do a check daily -continue to exercise 15 minutes /day split into 5 minute increments Wt Readings from Last 3 Encounters:  03/30/23 (!) 328 lb 12.8 oz (149.1 kg)  03/29/23 (!) 322 lb (146.1 kg)  03/03/23 (!) 328 lb (148.8 kg)          SDOH assessments and interventions completed:  Yes     Care Coordination Interventions:  Yes, provided   Interventions Today    Flowsheet Row Most Recent Value  Chronic Disease   Chronic disease during today's visit Congestive Heart Failure (CHF)  General Interventions   General Interventions Discussed/Reviewed General Interventions Discussed, General Interventions Reviewed  Nutrition Interventions   Nutrition Discussed/Reviewed Nutrition Discussed  Pharmacy Interventions   Pharmacy Dicussed/Reviewed Pharmacy Topics Discussed  Safety Interventions   Safety Discussed/Reviewed Safety Discussed        Follow up plan: Follow up call scheduled for 05/12/23  2 pm    Encounter Outcome:  Patient Visit Completed   Juanell Fairly RN, BSN, [facility], [facility]  Care Coordinator Phone: [phone]

## 2023-04-18 ENCOUNTER — Ambulatory Visit: Admitting: Family Medicine

## 2023-04-18 ENCOUNTER — Other Ambulatory Visit (HOSPITAL_COMMUNITY): Payer: Self-pay

## 2023-04-18 ENCOUNTER — Encounter: Payer: Self-pay | Admitting: Sports Medicine

## 2023-04-18 ENCOUNTER — Encounter: Payer: Self-pay | Admitting: Family Medicine

## 2023-04-18 ENCOUNTER — Ambulatory Visit (INDEPENDENT_AMBULATORY_CARE_PROVIDER_SITE_OTHER): Admitting: Sports Medicine

## 2023-04-18 VITALS — BP 126/84 | HR 64 | Temp 97.2°F | Ht 68.0 in | Wt 324.4 lb

## 2023-04-18 VITALS — BP 120/72 | Ht 68.0 in | Wt 324.0 lb

## 2023-04-18 DIAGNOSIS — M7551 Bursitis of right shoulder: Secondary | ICD-10-CM | POA: Insufficient documentation

## 2023-04-18 DIAGNOSIS — Z7985 Long-term (current) use of injectable non-insulin antidiabetic drugs: Secondary | ICD-10-CM | POA: Diagnosis not present

## 2023-04-18 DIAGNOSIS — N184 Chronic kidney disease, stage 4 (severe): Secondary | ICD-10-CM

## 2023-04-18 DIAGNOSIS — E1169 Type 2 diabetes mellitus with other specified complication: Secondary | ICD-10-CM | POA: Diagnosis not present

## 2023-04-18 DIAGNOSIS — M5412 Radiculopathy, cervical region: Secondary | ICD-10-CM

## 2023-04-18 LAB — BASIC METABOLIC PANEL
BUN: 17 mg/dL (ref 6–23)
CO2: 31 meq/L (ref 19–32)
Calcium: 8.6 mg/dL (ref 8.4–10.5)
Chloride: 102 meq/L (ref 96–112)
Creatinine, Ser: 2.29 mg/dL — ABNORMAL HIGH (ref 0.40–1.20)
GFR: 20.99 mL/min — ABNORMAL LOW (ref >60.00)
Glucose, Bld: 97 mg/dL (ref 70–99)
Potassium: 3.2 meq/L — ABNORMAL LOW (ref 3.5–5.1)
Sodium: 140 meq/L (ref 135–145)

## 2023-04-18 LAB — HEMOGLOBIN A1C: Hgb A1c MFr Bld: 6 % (ref 4.6–6.5)

## 2023-04-18 NOTE — Progress Notes (Signed)
 Established Patient Office Visit   Subjective:  Patient ID: Stacey Page, female    DOB: 1950-10-18  Age: 73 y.o. MRN: [id number]  Chief Complaint  Patient presents with   Shoulder Pain    Right shoulder pain x 1 week.. Shooting pain.     Shoulder Pain  Pertinent negatives include no tingling.   Encounter Diagnoses  Name Primary?   Stage 4 chronic kidney disease (HCC) Yes   Type 2 diabetes mellitus with other specified complication, without long-term current use of insulin (HCC)    Subacromial bursitis of right shoulder joint    Here for follow-up today accompanied by her daughter.  Nephrology has recommended that she start dialysis.  Continues to produce urine.  Continue seeing weight loss management.  Complains of 1 week history of right shoulder pain.  No injury.  Right-hand-dominant.   Review of Systems  Constitutional: Negative.   HENT: Negative.    Eyes:  Negative for blurred vision, discharge and redness.  Respiratory: Negative.    Cardiovascular: Negative.   Gastrointestinal:  Negative for abdominal pain.  Genitourinary: Negative.   Musculoskeletal:  Positive for joint pain. Negative for myalgias and neck pain.  Skin:  Negative for rash.  Neurological:  Negative for tingling, loss of consciousness and weakness.  Endo/Heme/Allergies:  Negative for polydipsia.     Current Outpatient Medications:    Accu-Chek Softclix Lancets lancets, May check glucose up to 3 times daily., Disp: 100 each, Rfl: 12   albuterol (VENTOLIN HFA) 108 (90 Base) MCG/ACT inhaler, INHALE 1-2 PUFFS into THE lungs EVERY 6 HOURS AS NEEDED FOR WHEEZING AND/OR SHORTNESS OF BREATH, Disp: 6.7 g, Rfl: 3   aspirin 81 MG tablet, Take 1 tablet (81 mg total) by mouth daily with breakfast., Disp: 90 tablet, Rfl: 0   atorvastatin (LIPITOR) 20 MG tablet, Take 1 tablet (20 mg total) by mouth daily., Disp: 90 tablet, Rfl: 3   Blood Glucose Monitoring Suppl (ONETOUCH VERIO SYNC SYSTEM) w/Device KIT, 1 kit  by Does not apply route daily as needed., Disp: 1 kit, Rfl: 0   carvedilol (COREG) 25 MG tablet, Take 1 tablet (25 mg total) by mouth 2 (two) times daily., Disp: 180 tablet, Rfl: 3   cholecalciferol (VITAMIN D3) 25 MCG (1000 UNIT) tablet, Take 2,000 Units by mouth daily., Disp: , Rfl:    dapagliflozin propanediol (FARXIGA) 5 MG TABS tablet, Take 1 tablet (5 mg total) by mouth daily before breakfast., Disp: 90 tablet, Rfl: 2   Docusate Sodium 100 MG capsule, Take 100 mg by mouth 2 (two) times daily., Disp: , Rfl:    ferrous sulfate 325 (65 FE) MG tablet, Take 325 mg by mouth 2 (two) times daily with a meal., Disp: , Rfl:    fluticasone (FLONASE) 50 MCG/ACT nasal spray, Place 1 spray into both nostrils daily., Disp: 48 g, Rfl: 1   furosemide (LASIX) 80 MG tablet, Take 1 tablet (80 mg total) by mouth daily., Disp: 90 tablet, Rfl: 1   gabapentin (NEURONTIN) 100 MG capsule, Take 1 capsule (100 mg total) by mouth 2 (two) times daily., Disp: 180 capsule, Rfl: 1   glucose blood (ONETOUCH VERIO) test strip, Use to monitor blood sugar twice daily as instructed, Disp: 200 each, Rfl: 3   hydrALAZINE (APRESOLINE) 100 MG tablet, Take 1 tablet (100 mg total) by mouth 3 (three) times daily., Disp: 270 tablet, Rfl: 1   hydrALAZINE (APRESOLINE) 50 MG tablet, Take 1 tablet (50 mg total) by mouth every 8 (eight) hours.,  Disp: 90 tablet, Rfl: 3   hydrALAZINE (APRESOLINE) 50 MG tablet, Take 2 tablets (100 mg total) by mouth 3 (three) times daily., Disp: 540 tablet, Rfl: 1   HYDROcodone bit-homatropine (HYDROMET) 5-1.5 MG/5ML syrup, Take 5 mLs by mouth every 6 (six) hours as needed for cough., Disp: 120 mL, Rfl: 0   isosorbide dinitrate (ISORDIL) 10 MG tablet, Take 1 tablet (10 mg total) by mouth 3 (three) times daily. Please keep scheduled appointment, Disp: 270 tablet, Rfl: 3   losartan (COZAAR) 25 MG tablet, Take 0.5 tablets (12.5 mg total) by mouth daily., Disp: 45 tablet, Rfl: 3   magic mouthwash (nystatin, lidocaine,  diphenhydrAMINE, alum & mag hydroxide) suspension, Take 5 mLs by mouth 3 (three) times daily as needed for mouth pain., Disp: 150 mL, Rfl: 0   montelukast (SINGULAIR) 10 MG tablet, Take 1 tablet (10 mg total) by mouth at bedtime., Disp: 90 tablet, Rfl: 3   mupirocin ointment (BACTROBAN) 2 %, Apply 1 Application topically 2 (two) times daily for 14 days, Disp: 22 g, Rfl: 3   pantoprazole (PROTONIX) 40 MG tablet, Take 1 tablet (40 mg total) by mouth daily., Disp: 90 tablet, Rfl: 1   potassium chloride (KLOR-CON M) 10 MEQ tablet, Take 1 tablet (10 mEq total) by mouth daily., Disp: 60 tablet, Rfl: 2   Semaglutide, 1 MG/DOSE, 4 MG/3ML SOPN, Inject 1 mg as directed once a week., Disp: 9 mL, Rfl: 0   Objective:     BP 126/84   Pulse 64   Temp (!) 97.2 F (36.2 C)   Ht 5\' 8"  (1.727 m)   Wt (!) 324 lb 6.4 oz (147.1 kg)   SpO2 97%   BMI 49.32 kg/m    Physical Exam Constitutional:      General: She is not in acute distress.    Appearance: Normal appearance. She is obese. She is not ill-appearing, toxic-appearing or diaphoretic.  HENT:     Head: Normocephalic and atraumatic.     Right Ear: External ear normal.     Left Ear: External ear normal.  Eyes:     General: No scleral icterus.       Right eye: No discharge.        Left eye: No discharge.     Extraocular Movements: Extraocular movements intact.     Conjunctiva/sclera: Conjunctivae normal.  Pulmonary:     Effort: Pulmonary effort is normal. No respiratory distress.  Musculoskeletal:     Right shoulder: Tenderness present. Normal range of motion. Normal strength.       Arms:  Skin:    General: Skin is warm and dry.  Neurological:     Mental Status: She is alert and oriented to person, place, and time.  Psychiatric:        Mood and Affect: Mood normal.        Behavior: Behavior normal.      No results found for any visits on 04/18/23.    The 10-year ASCVD risk score (Arnett DK, et al., 2019) is: 20.9%    Assessment &  Plan:   Stage 4 chronic kidney disease (HCC) -     Basic metabolic panel  Type 2 diabetes mellitus with other specified complication, without long-term current use of insulin (HCC) -     Basic metabolic panel -     Hemoglobin A1c  Subacromial bursitis of right shoulder joint -     Ambulatory referral to Sports Medicine    Return in about 3 months (  around 07/19/2023).    Mliss Sax, MD

## 2023-04-18 NOTE — Progress Notes (Signed)
   Subjective:    Patient ID: Stacey Page, female    DOB: January 13, 1951, 73 y.o.   MRN: [id number]  HPI chief complaint: Right shoulder pain  Patient is a very pleasant right-hand-dominant 73 year old female that presents today with a couple of weeks of right arm pain.  Her pain is most noticeable when she holds the arm in a dependent position by her side.  Pain is alleviated when raising the arm directly overhead.  She describes it as a tingling type of sensation that localizes itself to the upper arm.  No pain past the elbow.  She denies any weakness in the arm.  No similar issues in the past.  Past medical history reviewed Medications reviewed Allergies reviewed   Review of Systems As above    Objective:   Physical Exam  Well-developed, well-nourished.  No acute distress  Cervical spine: Good cervical range of motion with a positive Spurling's to the right.  Neurological exam shows good strength in both upper extremities.  Right shoulder: Full painless shoulder range of motion.  No signs of impingement including a negative empty can.  Rotator cuff strength is 5/5 does not reproduce pain.      Assessment & Plan:   Right arm pain secondary to cervical radiculopathy  Patient will start physical therapy.  She takes 100 mg of gabapentin twice daily for neuropathy and I recommended that she increase that to 100 mg 3 times daily.  She will discuss that with her PCP.  If symptoms persist or worsen then consider imaging at that time including x-ray and MRI.  Follow-up for ongoing or recalcitrant issues.  This note was dictated using Dragon naturally speaking software and may contain errors in syntax, spelling, or content which have not been identified prior to signing this note.

## 2023-04-19 DIAGNOSIS — L600 Ingrowing nail: Secondary | ICD-10-CM | POA: Diagnosis not present

## 2023-04-19 DIAGNOSIS — M2041 Other hammer toe(s) (acquired), right foot: Secondary | ICD-10-CM | POA: Diagnosis not present

## 2023-04-19 DIAGNOSIS — I739 Peripheral vascular disease, unspecified: Secondary | ICD-10-CM | POA: Diagnosis not present

## 2023-04-19 DIAGNOSIS — B351 Tinea unguium: Secondary | ICD-10-CM | POA: Diagnosis not present

## 2023-04-19 DIAGNOSIS — M792 Neuralgia and neuritis, unspecified: Secondary | ICD-10-CM | POA: Diagnosis not present

## 2023-04-19 DIAGNOSIS — E1142 Type 2 diabetes mellitus with diabetic polyneuropathy: Secondary | ICD-10-CM | POA: Diagnosis not present

## 2023-04-19 NOTE — Therapy (Signed)
 OUTPATIENT PHYSICAL THERAPY CERVICAL EVALUATION   Patient Name: Stacey Page MRN: [id number] DOB:11-19-1950, 73 y.o., female Today's Date: 04/20/2023  END OF SESSION:  PT End of Session - 04/20/23 1459     Visit Number 1    Date for PT Re-Evaluation 06/15/23    Authorization Type HTA    PT Start Time 1500    PT Stop Time 1545    PT Time Calculation (min) 45 min             Past Medical History:  Diagnosis Date   Anxiety    doesn't take any meds   Arthritis of right knee 03/14/2016   Asthma    Back pain    Chronic diastolic CHF (congestive heart failure) (HCC)    HF with Preserved EF (60-65%) - Grade II Diastolic Dysfunction (Hypertensive Heart Disease). takes Furosemide daily   Depression    doesn't take meds   Diabetes (HCC)    takes Januvia daily   Eczema    uses cream as needed   GERD (gastroesophageal reflux disease)    History of bronchitis as a child    HTN (hypertension)    Insomnia    Moderate aortic stenosis by prior echocardiogram 04/2017   10/15/2021: Georgina Quint calcification with moderate AS, MG 23 mmHg   Mycobacterium avium-intracellulare infection (HCC) 2016   OA (osteoarthritis)    Obese    OSA (obstructive sleep apnea) 02/25/2014   wears CPAP at night   Peripheral neuropathy    takes Gabapentin as needed   Pneumonia    hx of-2010   Seasonal allergies    uses Flonase daily   Sleep apnea    Past Surgical History:  Procedure Laterality Date   BREAST BIOPSY Right 03/2018   BREAST LUMPECTOMY WITH RADIOACTIVE SEED LOCALIZATION Right 12/28/2018   Procedure: RIGHT BREAST LUMPECTOMY WITH RADIOACTIVE SEED LOCALIZATION;  Surgeon: Griselda Miner, MD;  Location: [facility] OR;  Service: General;  Laterality: Right;   BREAST SURGERY     CARDIOPULMONARY EXERCISE TEST (CPX)  06/21/2021   (Performed on Coreg 25 mg twice daily): PFTs: FVC 1.76 (60%), FEV1 1.59 (77%), ratio 114% => RESTRICTIVE PHYSIOLOGY; 5 min- 1 mph - 2% grade. 7/10 dyspnea -> pulse ox range  during exercise 95 to 98%, low of 90%, 97% on recovery; BP 106/84-160/64; heart rate 64-90 bpm (60% MPHR) -> Chronotropic Incompetence;;MAIN FACTOR for Exercise Limitation = BODY HABITUS w/ Chronotropic Incompetence.   CESAREAN SECTION  x2   COLONOSCOPY     CORONARY CALCIUM SCORE & CT ANGIOGRAM  09/2017   Coronary Ca Score = 11 (low).  CTA- no obstructive CAD (minimal disease)   JOINT REPLACEMENT     KNEE ARTHROSCOPY Right    LUNG BIOPSY Right 06/10/2014   Procedure: LUNG BIOPSY;  Surgeon: Kerin Perna, MD;  Location: [facility] OR;  Service: Thoracic;  Laterality: Right;   POLYPECTOMY     throat   RIGHT HEART CATH N/A 10/15/2021   Procedure: RIGHT HEART CATH;  Surgeon: Lyn Records, MD;  Location: [facility] INVASIVE CV LAB;  Service: CV: Mild Pulmonary Hypertension (WHO Group 3).  Mean PAP 28 mmHg, PCWP 13 mmHg.  PVR 2.73 [place].  RAP 6 mmHg.CO-CI 5.5L/min, 2.17 L/min/m.   TOTAL KNEE ARTHROPLASTY Right 03/14/2016   Procedure: RIGHT TOTAL KNEE ARTHROPLASTY;  Surgeon: Eldred Manges, MD;  Location: [facility] OR;  Service: Orthopedics;  Laterality: Right;   TRANSTHORACIC ECHOCARDIOGRAM  10/10/2021   EF 70 to 75%.  Hyperdynamic with no RWMA.  Moderate LVH.  Moderate LA dilation indicating likely diastolic dysfunction although not interpretable.  Moderately elevated PAP with normal RV function, dilated IVC with elevated RAP estimated at 15 mmHg..  Moderate MAC with no MR.  AOV calcification with Moderate AS-mean AvG 23 mmHg.   TRANSTHORACIC ECHOCARDIOGRAM  06/14/2021   EF 60 to 65%.  No RWMA.  Moderate cLVH with GR 1 DD.  Mild LA dilation.  Mild to moderate aortic calcification/stenosis.  Mean AVG 18 mmHg.   VIDEO ASSISTED THORACOSCOPY Right 06/10/2014   Procedure: VIDEO ASSISTED THORACOSCOPY;  Surgeon: Kerin Perna, MD;  Location: [facility] OR;  Service: Thoracic;  Laterality: Right;   Patient Active Problem List   Diagnosis Date Noted   Subacromial bursitis of right shoulder joint 04/18/2023   Asthma 03/03/2023    Urinary frequency 02/03/2023   Viral upper respiratory tract infection 12/20/2022   Dysthymia 02/08/2022   Hypokalemia 01/26/2022   Mild pulmonary hypertension (HCC) 01/09/2022   Need for influenza vaccination 12/02/2021   Dysfunction of left eustachian tube 12/02/2021   Allergic rhinitis 12/02/2021   Chronic respiratory failure with hypoxia (HCC) 11/11/2021   Demand ischemia (HCC)    Respiratory acidosis    Acute respiratory failure with hypercapnia (HCC)    Acute on chronic heart failure with preserved ejection fraction (HCC)    CHF (congestive heart failure) (HCC) 10/09/2021   Reactive airway disease 09/21/2021   Viral syndrome 09/21/2021   Hypertensive retinopathy of right eye, grade 1 08/11/2021   Grade 2 hypertensive retinopathy, left 08/11/2021   Nuclear sclerotic cataract of both eyes 08/11/2021   Need for shingles vaccine 07/14/2021   Iron deficiency anemia 01/13/2021   Stage 4 chronic kidney disease (HCC) 07/09/2019   Type 2 diabetes mellitus with obesity (HCC) 07/02/2019   Polyneuropathy associated with underlying disease (HCC) 11/28/2018   Severe episode of recurrent major depressive disorder, without psychotic features (HCC) 11/28/2018   Type 2 diabetes mellitus with diabetic peripheral angiopathy without gangrene, without long-term current use of insulin (HCC) 11/28/2018   Poor short term memory 11/28/2018   Moderate aortic stenosis by prior echocardiogram 11/06/2018   Diabetes mellitus (HCC) 03/21/2017   Acute renal failure superimposed on stage 2 chronic kidney disease (HCC) 03/17/2016   Symptomatic anemia 03/17/2016   S/P total knee arthroplasty, right 03/17/2016   Unilateral primary osteoarthritis, right knee    Hyperlipidemia associated with type 2 diabetes mellitus (HCC) 12/15/2015   Pain in both lower extremities 11/19/2015   Numbness in both hands 08/13/2015   Panic attack 06/09/2015   Right knee pain 05/14/2015   Chronic renal insufficiency 09/16/2014    Pneumothorax on right    Atypical mycobacterium infection    ILD (interstitial lung disease) 2/2 MAI s/p med rxn    Class 3 severe obesity with serious comorbidity and body mass index (BMI) of 50.0 to 59.9 in adult (HCC) 04/03/2014   OSA (obstructive sleep apnea) 02/25/2014   Hypertensive heart disease with chronic diastolic congestive heart failure (HCC) 02/25/2014   DOE (dyspnea on exertion) 02/25/2014   Essential hypertension 02/25/2014    PCP: Nadene Rubins  REFERRING PROVIDER: Reino Bellis  REFERRING DIAG:  [phone] (ICD-10-CM) - Cervical radiculopathy    THERAPY DIAG:  Radiculopathy, cervical region  Cervicalgia  Acute pain of right shoulder  Rationale for Evaluation and Treatment: Rehabilitation  ONSET DATE: "a couple of weeks"  SUBJECTIVE:  SUBJECTIVE STATEMENT: Sleepless because of the pain. It wakes me up. If I lift my arm up the pain stops. It started about a week ago and stayed consistent. It mostly happens when I am laying down.    Hand dominance: Right  PERTINENT HISTORY:  Patient is a very pleasant right-hand-dominant 73 year old female that presents today with a couple of weeks of right arm pain.  Her pain is most noticeable when she holds the arm in a dependent position by her side.  Pain is alleviated when raising the arm directly overhead.  She describes it as a tingling type of sensation that localizes itself to the upper arm.  No pain past the elbow.  She denies any weakness in the arm.  No similar issues in the past. Patient will start physical therapy.  She takes 100 mg of gabapentin twice daily for neuropathy and I recommended that she increase that to 100 mg 3 times daily.  She will discuss that with her PCP.  If symptoms persist or worsen then consider  imaging at that time including x-ray and MRI.  Follow-up for ongoing or recalcitrant issues.   PAIN:  Are you having pain? Yes: NPRS scale: 10/10 Pain location: neck, R shoulder,, not passed into my hands Pain description: numbness, annoying, constant when I am in bed Aggravating factors: laying down, household chores, getting dressed  Relieving factors: nothing has been helping  PRECAUTIONS: None  RED FLAGS: None    WEIGHT BEARING RESTRICTIONS: No  FALLS:  Has patient fallen in last 6 months? Yes. Number of falls I had 1, I passed out this Saturday after I was sick and vomiting   LIVING ENVIRONMENT: Lives with: lives with their family Lives in: House/apartment Stairs: No Has following equipment at home: Environmental consultant - 2 wheeled  OCCUPATION: retired   PLOF: Independent  PATIENT GOALS: I want to release this as soon as I can   NEXT MD VISIT: have not scheduled yet  OBJECTIVE:  Note: Objective measures were completed at Evaluation unless otherwise noted.  DIAGNOSTIC FINDINGS:  N/A  COGNITION: Overall cognitive status: Within functional limits for tasks assessed  SENSATION: WFL  POSTURE: rounded shoulders and forward head  PALPATION: TTP and trigger points noted in R UT    CERVICAL ROM:   Active ROM A/PROM (deg) eval  Flexion WNL  Extension WNL pain at end range but nothing severe  Right lateral flexion 75%  Left lateral flexion 75% pain in R UT  Right rotation WNL  Left rotation WNL   (Blank rows = not tested)  UPPER EXTREMITY ROM: good overall ROM within functional limits    UPPER EXTREMITY MMT: grossly 4+/5 BUE, shoulder abd 3+ BUE with pain    CERVICAL SPECIAL TESTS:  Upper limb tension test (ULTT): Negative, Spurling's test: Positive, and Distraction test: Positive   TREATMENT DATE: 04/20/23- EVAL  PATIENT EDUCATION:   Education details: POC and HEP Person educated: Patient Education method: Explanation Education comprehension: verbalized understanding  HOME EXERCISE PROGRAM: Access Code: [email or web address] URL: [email or web address] Date: 04/20/2023 Prepared by: Cassie Freer  Exercises - Seated Upper Trapezius Stretch  - 1 x daily - 7 x weekly - 2 sets - 3 reps - 15 hold - Seated Levator Scapulae Stretch  - 1 x daily - 7 x weekly - 2 sets - 3 reps - 15 hold - Seated Cervical Retraction  - 1 x daily - 7 x weekly - 2 sets - 10 reps - Doorway Pec Stretch at 90 Degrees Abduction  - 1 x daily - 7 x weekly - 2 sets - 3 reps - 15 hold  ASSESSMENT:  CLINICAL IMPRESSION: Patient is a 73 y.o. female who was seen today for physical therapy evaluation and treatment for cervical radiculopathy. She reports 10/10 pain in her neck and R arm especially when laying down. She mostly sleeps on her back due to using a CPAP. When she lifts her arm above head she has pain relief. This indicates possible C5-C6 nerve root involvement. Patient has pain with palpation and several sore spots and trigger points along her R upper trap. She will benefit from PT to address her neck and upper arm pain to allow her to sleep, get dressed, and complete her ADLS without pain.   OBJECTIVE IMPAIRMENTS: decreased ROM, decreased strength, impaired flexibility, postural dysfunction, and pain.   ACTIVITY LIMITATIONS: lifting, sleeping, and dressing  PARTICIPATION LIMITATIONS: cleaning, laundry, and driving  PERSONAL FACTORS: Age, Fitness, Past/current experiences, Time since onset of injury/illness/exacerbation, and 3+ comorbidities: CHF, DM, respiratory issues, HTN  are also affecting patient's functional outcome.   REHAB POTENTIAL: Good  CLINICAL DECISION MAKING: Stable/uncomplicated  EVALUATION COMPLEXITY: Low  GOALS: Goals reviewed with patient? Yes  SHORT TERM GOALS: Target date: 05/18/23  Patient will be independent with  initial HEP.  Baseline: HEP given 04/20/23 Goal status: INITIAL   LONG TERM GOALS: Target date:06/15/23  Patient will be independent with advanced/ongoing HEP to improve outcomes and carryover.  Baseline:  Goal status: INITIAL  2.  Patient will report 75% improvement in neck and upper arm pain to improve QOL.  Baseline: 10/10 esp at night Goal status: INITIAL  3.  Patient will demonstrate full pain free cervical ROM  Baseline: see chart Goal status: INITIAL  4.  Patient will able to sleep throughout the night without increase is pain and waking up due to arm pain Baseline: unable to sleep at night Goal status: INITIAL  PLAN:  PT FREQUENCY: 2x/week  PT DURATION: 8 weeks  PLANNED INTERVENTIONS: 97110-Therapeutic exercises, 97530- Therapeutic activity, 97112- Neuromuscular re-education, 97535- Self Care, 47829- Manual therapy, Patient/Family education, Taping, Dry Needling, Joint mobilization, Spinal mobilization, Cryotherapy, and Moist heat  PLAN FOR NEXT SESSION: manual therapy for neck, stretching for neck, DN if she wants to try it   Cassie Freer, PT 04/20/2023, 3:47 PM

## 2023-04-20 ENCOUNTER — Ambulatory Visit: Attending: Sports Medicine

## 2023-04-20 ENCOUNTER — Telehealth: Payer: Self-pay

## 2023-04-20 DIAGNOSIS — M6281 Muscle weakness (generalized): Secondary | ICD-10-CM | POA: Diagnosis not present

## 2023-04-20 DIAGNOSIS — J9610 Chronic respiratory failure, unspecified whether with hypoxia or hypercapnia: Secondary | ICD-10-CM | POA: Diagnosis not present

## 2023-04-20 DIAGNOSIS — M5412 Radiculopathy, cervical region: Secondary | ICD-10-CM | POA: Diagnosis not present

## 2023-04-20 DIAGNOSIS — M542 Cervicalgia: Secondary | ICD-10-CM | POA: Diagnosis not present

## 2023-04-20 DIAGNOSIS — M25511 Pain in right shoulder: Secondary | ICD-10-CM | POA: Diagnosis not present

## 2023-04-20 DIAGNOSIS — I272 Pulmonary hypertension, unspecified: Secondary | ICD-10-CM | POA: Diagnosis not present

## 2023-04-20 DIAGNOSIS — I509 Heart failure, unspecified: Secondary | ICD-10-CM | POA: Diagnosis not present

## 2023-04-20 NOTE — Telephone Encounter (Signed)
 PAP: Patient assistance application for Farxiga through [employer] ([employer]) has been mailed to pt's home address on file. Provider portion of application will be faxed to provider's office.  PAP: Patient assistance application for Ozempic through [employer] has been mailed to pt's home address on file. Provider portion of application will be faxed to provider's office. Have consent to E-File Patient portion

## 2023-04-23 ENCOUNTER — Other Ambulatory Visit (HOSPITAL_COMMUNITY): Payer: Self-pay

## 2023-04-24 ENCOUNTER — Other Ambulatory Visit: Payer: Self-pay

## 2023-04-24 ENCOUNTER — Other Ambulatory Visit (HOSPITAL_COMMUNITY): Payer: Self-pay

## 2023-04-25 ENCOUNTER — Other Ambulatory Visit: Payer: Self-pay

## 2023-04-25 ENCOUNTER — Other Ambulatory Visit (HOSPITAL_COMMUNITY): Payer: Self-pay

## 2023-04-25 ENCOUNTER — Other Ambulatory Visit: Payer: Self-pay | Admitting: Family Medicine

## 2023-04-25 DIAGNOSIS — J9610 Chronic respiratory failure, unspecified whether with hypoxia or hypercapnia: Secondary | ICD-10-CM | POA: Diagnosis not present

## 2023-04-25 DIAGNOSIS — I509 Heart failure, unspecified: Secondary | ICD-10-CM | POA: Diagnosis not present

## 2023-04-25 DIAGNOSIS — I272 Pulmonary hypertension, unspecified: Secondary | ICD-10-CM | POA: Diagnosis not present

## 2023-04-25 MED ORDER — CARVEDILOL 25 MG PO TABS
25.0000 mg | ORAL_TABLET | Freq: Two times a day (BID) | ORAL | 3 refills | Status: AC
  Filled 2023-04-25: qty 180, 90d supply, fill #0
  Filled 2023-07-28: qty 180, 90d supply, fill #1
  Filled 2023-10-21: qty 180, 90d supply, fill #2
  Filled 2024-02-14: qty 180, 90d supply, fill #3

## 2023-04-26 ENCOUNTER — Ambulatory Visit: Admitting: Physical Therapy

## 2023-04-26 ENCOUNTER — Encounter: Payer: Self-pay | Admitting: Physical Therapy

## 2023-04-26 DIAGNOSIS — M6281 Muscle weakness (generalized): Secondary | ICD-10-CM

## 2023-04-26 DIAGNOSIS — M25511 Pain in right shoulder: Secondary | ICD-10-CM

## 2023-04-26 DIAGNOSIS — M5412 Radiculopathy, cervical region: Secondary | ICD-10-CM

## 2023-04-26 DIAGNOSIS — M542 Cervicalgia: Secondary | ICD-10-CM

## 2023-04-26 NOTE — Therapy (Signed)
 OUTPATIENT PHYSICAL THERAPY CERVICAL TREATMENT   Patient Name: Stacey Page MRN: [id number] DOB:October 10, 1950, 73 y.o., female Today's Date: 04/26/2023  END OF SESSION:  PT End of Session - 04/26/23 0846     Visit Number 2    Date for PT Re-Evaluation 06/15/23    PT Start Time 0845    PT Stop Time 0930    PT Time Calculation (min) 45 min    Activity Tolerance Patient tolerated treatment well    Behavior During Therapy [facility] for tasks assessed/performed             Past Medical History:  Diagnosis Date   Anxiety    doesn't take any meds   Arthritis of right knee 03/14/2016   Asthma    Back pain    Chronic diastolic CHF (congestive heart failure) (HCC)    HF with Preserved EF (60-65%) - Grade II Diastolic Dysfunction (Hypertensive Heart Disease). takes Furosemide daily   Depression    doesn't take meds   Diabetes (HCC)    takes Januvia daily   Eczema    uses cream as needed   GERD (gastroesophageal reflux disease)    History of bronchitis as a child    HTN (hypertension)    Insomnia    Moderate aortic stenosis by prior echocardiogram 04/2017   10/15/2021: Georgina Quint calcification with moderate AS, MG 23 mmHg   Mycobacterium avium-intracellulare infection (HCC) 2016   OA (osteoarthritis)    Obese    OSA (obstructive sleep apnea) 02/25/2014   wears CPAP at night   Peripheral neuropathy    takes Gabapentin as needed   Pneumonia    hx of-2010   Seasonal allergies    uses Flonase daily   Sleep apnea    Past Surgical History:  Procedure Laterality Date   BREAST BIOPSY Right 03/2018   BREAST LUMPECTOMY WITH RADIOACTIVE SEED LOCALIZATION Right 12/28/2018   Procedure: RIGHT BREAST LUMPECTOMY WITH RADIOACTIVE SEED LOCALIZATION;  Surgeon: Griselda Miner, MD;  Location: [facility] OR;  Service: General;  Laterality: Right;   BREAST SURGERY     CARDIOPULMONARY EXERCISE TEST (CPX)  06/21/2021   (Performed on Coreg 25 mg twice daily): PFTs: FVC 1.76 (60%), FEV1 1.59 (77%), ratio  114% => RESTRICTIVE PHYSIOLOGY; 5 min- 1 mph - 2% grade. 7/10 dyspnea -> pulse ox range during exercise 95 to 98%, low of 90%, 97% on recovery; BP 106/84-160/64; heart rate 64-90 bpm (60% MPHR) -> Chronotropic Incompetence;;MAIN FACTOR for Exercise Limitation = BODY HABITUS w/ Chronotropic Incompetence.   CESAREAN SECTION  x2   COLONOSCOPY     CORONARY CALCIUM SCORE & CT ANGIOGRAM  09/2017   Coronary Ca Score = 11 (low).  CTA- no obstructive CAD (minimal disease)   JOINT REPLACEMENT     KNEE ARTHROSCOPY Right    LUNG BIOPSY Right 06/10/2014   Procedure: LUNG BIOPSY;  Surgeon: Kerin Perna, MD;  Location: [facility] OR;  Service: Thoracic;  Laterality: Right;   POLYPECTOMY     throat   RIGHT HEART CATH N/A 10/15/2021   Procedure: RIGHT HEART CATH;  Surgeon: Lyn Records, MD;  Location: [facility] INVASIVE CV LAB;  Service: CV: Mild Pulmonary Hypertension (WHO Group 3).  Mean PAP 28 mmHg, PCWP 13 mmHg.  PVR 2.73 [place].  RAP 6 mmHg.CO-CI 5.5L/min, 2.17 L/min/m.   TOTAL KNEE ARTHROPLASTY Right 03/14/2016   Procedure: RIGHT TOTAL KNEE ARTHROPLASTY;  Surgeon: Eldred Manges, MD;  Location: [facility] OR;  Service: Orthopedics;  Laterality: Right;   TRANSTHORACIC ECHOCARDIOGRAM  10/10/2021   EF 70 to 75%.  Hyperdynamic with no RWMA.  Moderate LVH.  Moderate LA dilation indicating likely diastolic dysfunction although not interpretable.  Moderately elevated PAP with normal RV function, dilated IVC with elevated RAP estimated at 15 mmHg..  Moderate MAC with no MR.  AOV calcification with Moderate AS-mean AvG 23 mmHg.   TRANSTHORACIC ECHOCARDIOGRAM  06/14/2021   EF 60 to 65%.  No RWMA.  Moderate cLVH with GR 1 DD.  Mild LA dilation.  Mild to moderate aortic calcification/stenosis.  Mean AVG 18 mmHg.   VIDEO ASSISTED THORACOSCOPY Right 06/10/2014   Procedure: VIDEO ASSISTED THORACOSCOPY;  Surgeon: Kerin Perna, MD;  Location: [facility] OR;  Service: Thoracic;  Laterality: Right;   Patient Active Problem List   Diagnosis Date  Noted   Subacromial bursitis of right shoulder joint 04/18/2023   Asthma 03/03/2023   Urinary frequency 02/03/2023   Viral upper respiratory tract infection 12/20/2022   Dysthymia 02/08/2022   Hypokalemia 01/26/2022   Mild pulmonary hypertension (HCC) 01/09/2022   Need for influenza vaccination 12/02/2021   Dysfunction of left eustachian tube 12/02/2021   Allergic rhinitis 12/02/2021   Chronic respiratory failure with hypoxia (HCC) 11/11/2021   Demand ischemia (HCC)    Respiratory acidosis    Acute respiratory failure with hypercapnia (HCC)    Acute on chronic heart failure with preserved ejection fraction (HCC)    CHF (congestive heart failure) (HCC) 10/09/2021   Reactive airway disease 09/21/2021   Viral syndrome 09/21/2021   Hypertensive retinopathy of right eye, grade 1 08/11/2021   Grade 2 hypertensive retinopathy, left 08/11/2021   Nuclear sclerotic cataract of both eyes 08/11/2021   Need for shingles vaccine 07/14/2021   Iron deficiency anemia 01/13/2021   Stage 4 chronic kidney disease (HCC) 07/09/2019   Type 2 diabetes mellitus with obesity (HCC) 07/02/2019   Polyneuropathy associated with underlying disease (HCC) 11/28/2018   Severe episode of recurrent major depressive disorder, without psychotic features (HCC) 11/28/2018   Type 2 diabetes mellitus with diabetic peripheral angiopathy without gangrene, without long-term current use of insulin (HCC) 11/28/2018   Poor short term memory 11/28/2018   Moderate aortic stenosis by prior echocardiogram 11/06/2018   Diabetes mellitus (HCC) 03/21/2017   Acute renal failure superimposed on stage 2 chronic kidney disease (HCC) 03/17/2016   Symptomatic anemia 03/17/2016   S/P total knee arthroplasty, right 03/17/2016   Unilateral primary osteoarthritis, right knee    Hyperlipidemia associated with type 2 diabetes mellitus (HCC) 12/15/2015   Pain in both lower extremities 11/19/2015   Numbness in both hands 08/13/2015   Panic  attack 06/09/2015   Right knee pain 05/14/2015   Chronic renal insufficiency 09/16/2014   Pneumothorax on right    Atypical mycobacterium infection    ILD (interstitial lung disease) 2/2 MAI s/p med rxn    Class 3 severe obesity with serious comorbidity and body mass index (BMI) of 50.0 to 59.9 in adult (HCC) 04/03/2014   OSA (obstructive sleep apnea) 02/25/2014   Hypertensive heart disease with chronic diastolic congestive heart failure (HCC) 02/25/2014   DOE (dyspnea on exertion) 02/25/2014   Essential hypertension 02/25/2014    PCP: Nadene Rubins  REFERRING PROVIDER: Reino Bellis  REFERRING DIAG:  [phone] (ICD-10-CM) - Cervical radiculopathy    THERAPY DIAG:  Radiculopathy, cervical region  Cervicalgia  Acute pain of right shoulder  Muscle weakness (generalized)  Rationale for Evaluation and Treatment: Rehabilitation  ONSET DATE: "a couple of weeks"  SUBJECTIVE:  SUBJECTIVE STATEMENT: Now its ok, no pain, pain normally comes in he shoulder  Hand dominance: Right  PERTINENT HISTORY:  Patient is a very pleasant right-hand-dominant 73 year old female that presents today with a couple of weeks of right arm pain.  Her pain is most noticeable when she holds the arm in a dependent position by her side.  Pain is alleviated when raising the arm directly overhead.  She describes it as a tingling type of sensation that localizes itself to the upper arm.  No pain past the elbow.  She denies any weakness in the arm.  No similar issues in the past. Patient will start physical therapy.  She takes 100 mg of gabapentin twice daily for neuropathy and I recommended that she increase that to 100 mg 3 times daily.  She will discuss that with her PCP.  If symptoms persist or worsen then consider  imaging at that time including x-ray and MRI.  Follow-up for ongoing or recalcitrant issues.   PAIN:  Are you having pain? Yes: NPRS scale: 0/10 Pain location: neck, R shoulder,, not passed into my hands Pain description: numbness, annoying, constant when I am in bed Aggravating factors: laying down, household chores, getting dressed  Relieving factors: nothing has been helping  PRECAUTIONS: None  RED FLAGS: None    WEIGHT BEARING RESTRICTIONS: No  FALLS:  Has patient fallen in last 6 months? Yes. Number of falls I had 1, I passed out this Saturday after I was sick and vomiting   LIVING ENVIRONMENT: Lives with: lives with their family Lives in: House/apartment Stairs: No Has following equipment at home: Environmental consultant - 2 wheeled  OCCUPATION: retired   PLOF: Independent  PATIENT GOALS: I want to release this as soon as I can   NEXT MD VISIT: have not scheduled yet  OBJECTIVE:  Note: Objective measures were completed at Evaluation unless otherwise noted.  DIAGNOSTIC FINDINGS:  N/A  COGNITION: Overall cognitive status: Within functional limits for tasks assessed  SENSATION: WFL  POSTURE: rounded shoulders and forward head  PALPATION: TTP and trigger points noted in R UT    CERVICAL ROM:   Active ROM A/PROM (deg) eval  Flexion WNL  Extension WNL pain at end range but nothing severe  Right lateral flexion 75%  Left lateral flexion 75% pain in R UT  Right rotation WNL  Left rotation WNL   (Blank rows = not tested)  UPPER EXTREMITY ROM: good overall ROM within functional limits    UPPER EXTREMITY MMT: grossly 4+/5 BUE, shoulder abd 3+ BUE with pain    CERVICAL SPECIAL TESTS:  Upper limb tension test (ULTT): Negative, Spurling's test: Positive, and Distraction test: Positive   TREATMENT DATE:  04/26/23 AAROM Shoulder Flex, Ext, IR ups back x10 NuStep L 5 x 6 min Rows & Ext red 2x10 Shoulder ER yellow 2x10    04/20/23- EVAL  PATIENT EDUCATION:  Education details: POC and HEP Person educated: Patient Education method: Explanation Education comprehension: verbalized understanding  HOME EXERCISE PROGRAM: Access Code: [email or web address] URL: [email or web address] Date: 04/20/2023 Prepared by: Cassie Freer  Exercises - Seated Upper Trapezius Stretch  - 1 x daily - 7 x weekly - 2 sets - 3 reps - 15 hold - Seated Levator Scapulae Stretch  - 1 x daily - 7 x weekly - 2 sets - 3 reps - 15 hold - Seated Cervical Retraction  - 1 x daily - 7 x weekly - 2 sets - 10 reps - Doorway Pec Stretch at 90 Degrees Abduction  - 1 x daily - 7 x weekly - 2 sets - 3 reps - 15 hold  ASSESSMENT:  CLINICAL IMPRESSION: Patient is a 73 y.o. female who was seen today for physical therapy treatment for cervical radiculopathy. She enters reporting no pain. Today's session consisted of light exercise interventions. All exercises completed well without repots of pain. Postural cues needed with rows and external rotation.  She will benefit from PT to address her neck and upper arm pain to allow her to sleep, get dressed, and complete her ADLS without pain.   OBJECTIVE IMPAIRMENTS: decreased ROM, decreased strength, impaired flexibility, postural dysfunction, and pain.   ACTIVITY LIMITATIONS: lifting, sleeping, and dressing  PARTICIPATION LIMITATIONS: cleaning, laundry, and driving  PERSONAL FACTORS: Age, Fitness, Past/current experiences, Time since onset of injury/illness/exacerbation, and 3+ comorbidities: CHF, DM, respiratory issues, HTN  are also affecting patient's functional outcome.   REHAB POTENTIAL: Good  CLINICAL DECISION MAKING: Stable/uncomplicated  EVALUATION COMPLEXITY: Low  GOALS: Goals reviewed with patient? Yes  SHORT TERM GOALS: Target date: 05/18/23  Patient will be independent with initial HEP.  Baseline: HEP  given 04/20/23 Goal status: INITIAL   LONG TERM GOALS: Target date:06/15/23  Patient will be independent with advanced/ongoing HEP to improve outcomes and carryover.  Baseline:  Goal status: INITIAL  2.  Patient will report 75% improvement in neck and upper arm pain to improve QOL.  Baseline: 10/10 esp at night Goal status: INITIAL  3.  Patient will demonstrate full pain free cervical ROM  Baseline: see chart Goal status: INITIAL  4.  Patient will able to sleep throughout the night without increase is pain and waking up due to arm pain Baseline: unable to sleep at night Goal status: INITIAL  PLAN:  PT FREQUENCY: 2x/week  PT DURATION: 8 weeks  PLANNED INTERVENTIONS: 97110-Therapeutic exercises, 97530- Therapeutic activity, 97112- Neuromuscular re-education, 97535- Self Care, 54098- Manual therapy, Patient/Family education, Taping, Dry Needling, Joint mobilization, Spinal mobilization, Cryotherapy, and Moist heat  PLAN FOR NEXT SESSION: manual therapy for neck, stretching for neck, DN if she wants to try it   Grayce Sessions, PTA 04/26/2023, 8:47 AM

## 2023-05-02 ENCOUNTER — Ambulatory Visit: Admitting: Physical Therapy

## 2023-05-02 ENCOUNTER — Other Ambulatory Visit: Payer: Self-pay

## 2023-05-02 ENCOUNTER — Other Ambulatory Visit (HOSPITAL_COMMUNITY): Payer: Self-pay

## 2023-05-02 ENCOUNTER — Encounter (INDEPENDENT_AMBULATORY_CARE_PROVIDER_SITE_OTHER): Payer: Self-pay | Admitting: Adult Health

## 2023-05-02 ENCOUNTER — Ambulatory Visit (INDEPENDENT_AMBULATORY_CARE_PROVIDER_SITE_OTHER): Payer: HMO | Admitting: Adult Health

## 2023-05-02 VITALS — BP 113/70 | HR 60 | Temp 97.8°F | Ht 68.0 in | Wt 315.0 lb

## 2023-05-02 DIAGNOSIS — E559 Vitamin D deficiency, unspecified: Secondary | ICD-10-CM

## 2023-05-02 DIAGNOSIS — Z7985 Long-term (current) use of injectable non-insulin antidiabetic drugs: Secondary | ICD-10-CM

## 2023-05-02 DIAGNOSIS — N184 Chronic kidney disease, stage 4 (severe): Secondary | ICD-10-CM | POA: Diagnosis not present

## 2023-05-02 DIAGNOSIS — E1169 Type 2 diabetes mellitus with other specified complication: Secondary | ICD-10-CM | POA: Diagnosis not present

## 2023-05-02 DIAGNOSIS — Z6841 Body Mass Index (BMI) 40.0 and over, adult: Secondary | ICD-10-CM | POA: Diagnosis not present

## 2023-05-02 DIAGNOSIS — E1122 Type 2 diabetes mellitus with diabetic chronic kidney disease: Secondary | ICD-10-CM

## 2023-05-02 DIAGNOSIS — E669 Obesity, unspecified: Secondary | ICD-10-CM

## 2023-05-02 MED ORDER — SEMAGLUTIDE (1 MG/DOSE) 4 MG/3ML ~~LOC~~ SOPN
1.0000 mg | PEN_INJECTOR | SUBCUTANEOUS | 0 refills | Status: DC
  Filled 2023-05-02: qty 9, 84d supply, fill #0

## 2023-05-02 NOTE — Progress Notes (Signed)
 WEIGHT SUMMARY AND BIOMETRICS  Vitals Temp: 97.8 F (36.6 C) BP: 113/70 Pulse Rate: 60 SpO2: 96 %   Anthropometric Measurements Height: 5\' 8"  (1.727 m) Weight: (!) 315 lb (142.9 kg) BMI (Calculated): 47.91 Weight at Last Visit: 322 lb Weight Lost Since Last Visit: 7 lb Starting Weight: 344 lb Total Weight Loss (lbs): 29 lb (13.2 kg)   Body Composition  Body Fat %: 53.8 % Fat Mass (lbs): 170 lbs Muscle Mass (lbs): 138.4 lbs Total Body Water (lbs): 99.4 lbs Visceral Fat Rating : 21   Other Clinical Data Fasting: yes Labs: no Today's Visit #: 42 Starting Date: 03/25/19    Chief Complaint:   OBESITY Stacey Page is here to discuss her progress with her obesity treatment plan.  She is on the the Category 2 Plan and states she is following her eating plan approximately 80 % of the time.  She states she is exercising Chair Exercises, Out patient Physical Therapy (PT) 45/30 minutes 2-3 times per week.   Interim History:  Ozempic increased to 1mg  on/about 02/28/2023 Denies mass in neck, dysphagia, dyspepsia, persistent hoarseness, abdominal pain, or N/V/C   Hunger/appetite- poor appetite since recent OV at Nephrologist.   Kidney fx has declined and she recently received pt education on various types of dialysis We reviewed at length recent labs, current medications, and eating plan.  Subjective:   1. Stage 4 chronic kidney disease (HCC) Kidney function slightly declined She is followed bu [facility]  Ms. Golay endorses poor appetite since recent OV at Nephrologist.   Kidney fx has declined and she recently received pt education on various types of dialysis- she is NOT beginning dialysis  BP well controlled at OV She denies acute cardiac sx's   Latest Reference Range & Units 12/12/22 15:47 02/03/23 08:28 04/18/23 10:41  Creatinine 0.40 - 1.20 mg/dL 1.47 (H) 8.29 (H) 5.62 (H)  (H): Data is abnormally high  Latest Reference Range & Units 07/14/22  13:53 02/03/23 08:28 04/18/23 10:41  GFR >60.00 mL/min 23.55 (L)  20.99 (L)  GFR, Est Non African American >60 mL/min  28 (L)   (L): Data is abnormally low  2. Type 2 diabetes mellitus with other specified complication, without long-term current use of insulin (HCC) Lab Results  Component Value Date   HGBA1C 6.0 04/18/2023   HGBA1C 6.2 (H) 12/12/2022   HGBA1C 6.5 07/14/2022    Home Fasting CBG - not checking She denies sx's of hypoglycemia She stared on loading dose Ozempic 0.25mg  once weekly injection on/about 10/13/2022 Ozempic increased to 0.5mg   on 11/10/2022 Has been in this lowest mx dose for 3 months Denies mass in neck, dysphagia, dyspepsia, persistent hoarseness, abdominal pain, or N/V/C  Ozempic increased from 0.5mg  to 1mg  on/about 02/28/2023  3. Vitamin D deficiency  Latest Reference Range & Units 12/12/22 15:47  Vitamin D, 25-Hydroxy 30.0 - 100.0 ng/mL 36.8   She is on daily OTC Vit D3 1,000 international units   Assessment/Plan:   1. Stage 4 chronic kidney disease (HCC) (Primary) Check Labs - Comprehensive metabolic panel  Avoid Nephrotoxic substances  Keep protein intake to 2 oz/14g per meal  Keep BP well controlled  2. Type 2 diabetes mellitus with other specified complication, without long-term current use of insulin (HCC) Check Labs - Hemoglobin A1c - Insulin, random - Vitamin B12 Refill  Semaglutide, 1 MG/DOSE, 4 MG/3ML SOPN Inject 1 mg as directed once a week. Dispense: 9 mL, Refills: 0 of 0 remaining  3. Vitamin D deficiency Check Labs - VITAMIN D 25 Hydroxy (Vit-D Deficiency, Fractures)  4. Current BMI 48.0  [name] is currently in the action stage of change. As such, her goal is to continue with weight loss efforts. She has agreed to the Category 2 Plan.   Exercise goals: Older adults should follow the adult guidelines. When older adults cannot meet the adult guidelines, they should be as physically active as their abilities and conditions  will allow.  Older adults should do exercises that maintain or improve balance if they are at risk of falling.  Older adults should determine their level of effort for physical activity relative to their level of fitness.  Older adults with chronic conditions should understand whether and how their conditions affect their ability to do regular physical activity safely.  Behavioral modification strategies: increasing lean protein intake, decreasing simple carbohydrates, increasing vegetables, increasing water intake, no skipping meals, meal planning and cooking strategies, keeping healthy foods in the home, and planning for success.  [name] has agreed to follow-up with our clinic in 4 weeks. She was informed of the importance of frequent follow-up visits to maximize her success with intensive lifestyle modifications for her multiple health conditions.   [name] was informed we would discuss her lab results at her next visit unless there is a critical issue that needs to be addressed sooner. [name] agreed to keep her next visit at the agreed upon time to discuss these results.  Objective:   Blood pressure 113/70, pulse 60, temperature 97.8 F (36.6 C), height 5\' 8"  (1.727 m), weight (!) 315 lb (142.9 kg), SpO2 96%. Body mass index is 47.9 kg/m.  General: Cooperative, alert, well developed, in no acute distress. HEENT: Conjunctivae and lids unremarkable. Cardiovascular: Regular rhythm.  Lungs: Normal work of breathing. Neurologic: No focal deficits.   Lab Results  Component Value Date   CREATININE 2.29 (H) 04/18/2023   BUN 17 04/18/2023   NA 140 04/18/2023   K 3.2 (L) 04/18/2023   CL 102 04/18/2023   CO2 31 04/18/2023   Lab Results  Component Value Date   ALT 9 02/03/2023   AST 11 (L) 02/03/2023   ALKPHOS 80 02/03/2023   BILITOT 0.4 02/03/2023   Lab Results  Component Value Date   HGBA1C 6.0 04/18/2023   HGBA1C 6.2 (H) 12/12/2022   HGBA1C 6.5 07/14/2022   HGBA1C 6.5  04/21/2022   HGBA1C 6.3 (H) 11/12/2021   Lab Results  Component Value Date   INSULIN 10.8 12/12/2022   INSULIN 8.6 10/28/2019   INSULIN 9.2 07/04/2019   INSULIN 8.8 03/25/2019   Lab Results  Component Value Date   TSH 0.51 11/28/2018   Lab Results  Component Value Date   CHOL 144 12/12/2022   HDL 63 12/12/2022   LDLCALC 68 12/12/2022   TRIG 62 12/12/2022   CHOLHDL 2.3 12/12/2022   Lab Results  Component Value Date   VD25OH 36.8 12/12/2022   VD25OH 57.6 10/28/2019   VD25OH 41.5 07/04/2019   Lab Results  Component Value Date   WBC 4.3 02/03/2023   HGB 13.5 02/03/2023   HCT 42.3 02/03/2023   MCV 92.6 02/03/2023   PLT 206 02/03/2023   Lab Results  Component Value Date   IRON 51 02/03/2023   TIBC 266 02/03/2023   FERRITIN 70 02/03/2023   Attestation Statements:   Reviewed by clinician on day of visit: allergies, medications, problem list, medical history, surgical history, family history, social history, and previous encounter notes.  I have reviewed the above documentation for accuracy and completeness, and I agree with the above. -  Rini Moffit d. Conny Moening, NP-C

## 2023-05-03 ENCOUNTER — Encounter: Payer: Self-pay | Admitting: Physical Therapy

## 2023-05-03 ENCOUNTER — Ambulatory Visit: Admitting: Physical Therapy

## 2023-05-03 DIAGNOSIS — M5412 Radiculopathy, cervical region: Secondary | ICD-10-CM

## 2023-05-03 DIAGNOSIS — M6281 Muscle weakness (generalized): Secondary | ICD-10-CM

## 2023-05-03 DIAGNOSIS — M25511 Pain in right shoulder: Secondary | ICD-10-CM

## 2023-05-03 DIAGNOSIS — M542 Cervicalgia: Secondary | ICD-10-CM

## 2023-05-03 LAB — COMPREHENSIVE METABOLIC PANEL
ALT: 11 IU/L (ref 0–32)
AST: 11 IU/L (ref 0–40)
Albumin: 4.3 g/dL (ref 3.8–4.8)
Alkaline Phosphatase: 86 IU/L (ref 44–121)
BUN/Creatinine Ratio: 8 — ABNORMAL LOW (ref 12–28)
BUN: 17 mg/dL (ref 8–27)
Bilirubin Total: 0.4 mg/dL (ref 0.0–1.2)
CO2: 27 mmol/L (ref 20–29)
Calcium: 9.6 mg/dL (ref 8.7–10.3)
Chloride: 98 mmol/L (ref 96–106)
Creatinine, Ser: 2.01 mg/dL — ABNORMAL HIGH (ref 0.57–1.00)
Globulin, Total: 3 g/dL (ref 1.5–4.5)
Glucose: 87 mg/dL (ref 70–99)
Potassium: 4.3 mmol/L (ref 3.5–5.2)
Sodium: 139 mmol/L (ref 134–144)
Total Protein: 7.3 g/dL (ref 6.0–8.5)
eGFR: 26 mL/min/{1.73_m2} — ABNORMAL LOW (ref >59)

## 2023-05-03 LAB — HEMOGLOBIN A1C
Est. average glucose Bld gHb Est-mCnc: 117 mg/dL
Hgb A1c MFr Bld: 5.7 % — ABNORMAL HIGH (ref 4.8–5.6)

## 2023-05-03 LAB — VITAMIN D 25 HYDROXY (VIT D DEFICIENCY, FRACTURES): Vit D, 25-Hydroxy: 41.7 ng/mL (ref 30.0–100.0)

## 2023-05-03 LAB — INSULIN, RANDOM: INSULIN: 6.9 u[IU]/mL (ref 2.6–24.9)

## 2023-05-03 LAB — VITAMIN B12: Vitamin B-12: 484 pg/mL (ref 232–1245)

## 2023-05-03 NOTE — Therapy (Signed)
 OUTPATIENT PHYSICAL THERAPY CERVICAL TREATMENT   Patient Name: Stacey Page MRN: [id number] DOB:1950/10/20, 73 y.o., female Today's Date: 05/03/2023  END OF SESSION:  PT End of Session - 05/03/23 0842     Visit Number 3    Date for PT Re-Evaluation 06/15/23    PT Start Time 0843    PT Stop Time 0927    PT Time Calculation (min) 44 min    Activity Tolerance Patient tolerated treatment well    Behavior During Therapy [facility] for tasks assessed/performed             Past Medical History:  Diagnosis Date   Anxiety    doesn't take any meds   Arthritis of right knee 03/14/2016   Asthma    Back pain    Chronic diastolic CHF (congestive heart failure) (HCC)    HF with Preserved EF (60-65%) - Grade II Diastolic Dysfunction (Hypertensive Heart Disease). takes Furosemide daily   Depression    doesn't take meds   Diabetes (HCC)    takes Januvia daily   Eczema    uses cream as needed   GERD (gastroesophageal reflux disease)    History of bronchitis as a child    HTN (hypertension)    Insomnia    Moderate aortic stenosis by prior echocardiogram 04/2017   10/15/2021: Georgina Quint calcification with moderate AS, MG 23 mmHg   Mycobacterium avium-intracellulare infection (HCC) 2016   OA (osteoarthritis)    Obese    OSA (obstructive sleep apnea) 02/25/2014   wears CPAP at night   Peripheral neuropathy    takes Gabapentin as needed   Pneumonia    hx of-2010   Seasonal allergies    uses Flonase daily   Sleep apnea    Past Surgical History:  Procedure Laterality Date   BREAST BIOPSY Right 03/2018   BREAST LUMPECTOMY WITH RADIOACTIVE SEED LOCALIZATION Right 12/28/2018   Procedure: RIGHT BREAST LUMPECTOMY WITH RADIOACTIVE SEED LOCALIZATION;  Surgeon: Griselda Miner, MD;  Location: [facility] OR;  Service: General;  Laterality: Right;   BREAST SURGERY     CARDIOPULMONARY EXERCISE TEST (CPX)  06/21/2021   (Performed on Coreg 25 mg twice daily): PFTs: FVC 1.76 (60%), FEV1 1.59 (77%), ratio  114% => RESTRICTIVE PHYSIOLOGY; 5 min- 1 mph - 2% grade. 7/10 dyspnea -> pulse ox range during exercise 95 to 98%, low of 90%, 97% on recovery; BP 106/84-160/64; heart rate 64-90 bpm (60% MPHR) -> Chronotropic Incompetence;;MAIN FACTOR for Exercise Limitation = BODY HABITUS w/ Chronotropic Incompetence.   CESAREAN SECTION  x2   COLONOSCOPY     CORONARY CALCIUM SCORE & CT ANGIOGRAM  09/2017   Coronary Ca Score = 11 (low).  CTA- no obstructive CAD (minimal disease)   JOINT REPLACEMENT     KNEE ARTHROSCOPY Right    LUNG BIOPSY Right 06/10/2014   Procedure: LUNG BIOPSY;  Surgeon: Kerin Perna, MD;  Location: [facility] OR;  Service: Thoracic;  Laterality: Right;   POLYPECTOMY     throat   RIGHT HEART CATH N/A 10/15/2021   Procedure: RIGHT HEART CATH;  Surgeon: Lyn Records, MD;  Location: [facility] INVASIVE CV LAB;  Service: CV: Mild Pulmonary Hypertension (WHO Group 3).  Mean PAP 28 mmHg, PCWP 13 mmHg.  PVR 2.73 [place].  RAP 6 mmHg.CO-CI 5.5L/min, 2.17 L/min/m.   TOTAL KNEE ARTHROPLASTY Right 03/14/2016   Procedure: RIGHT TOTAL KNEE ARTHROPLASTY;  Surgeon: Eldred Manges, MD;  Location: [facility] OR;  Service: Orthopedics;  Laterality: Right;   TRANSTHORACIC ECHOCARDIOGRAM  10/10/2021   EF 70 to 75%.  Hyperdynamic with no RWMA.  Moderate LVH.  Moderate LA dilation indicating likely diastolic dysfunction although not interpretable.  Moderately elevated PAP with normal RV function, dilated IVC with elevated RAP estimated at 15 mmHg..  Moderate MAC with no MR.  AOV calcification with Moderate AS-mean AvG 23 mmHg.   TRANSTHORACIC ECHOCARDIOGRAM  06/14/2021   EF 60 to 65%.  No RWMA.  Moderate cLVH with GR 1 DD.  Mild LA dilation.  Mild to moderate aortic calcification/stenosis.  Mean AVG 18 mmHg.   VIDEO ASSISTED THORACOSCOPY Right 06/10/2014   Procedure: VIDEO ASSISTED THORACOSCOPY;  Surgeon: Kerin Perna, MD;  Location: [facility] OR;  Service: Thoracic;  Laterality: Right;   Patient Active Problem List   Diagnosis Date  Noted   Subacromial bursitis of right shoulder joint 04/18/2023   Asthma 03/03/2023   Urinary frequency 02/03/2023   Viral upper respiratory tract infection 12/20/2022   Dysthymia 02/08/2022   Hypokalemia 01/26/2022   Mild pulmonary hypertension (HCC) 01/09/2022   Need for influenza vaccination 12/02/2021   Dysfunction of left eustachian tube 12/02/2021   Allergic rhinitis 12/02/2021   Chronic respiratory failure with hypoxia (HCC) 11/11/2021   Demand ischemia (HCC)    Respiratory acidosis    Acute respiratory failure with hypercapnia (HCC)    Acute on chronic heart failure with preserved ejection fraction (HCC)    CHF (congestive heart failure) (HCC) 10/09/2021   Reactive airway disease 09/21/2021   Viral syndrome 09/21/2021   Hypertensive retinopathy of right eye, grade 1 08/11/2021   Grade 2 hypertensive retinopathy, left 08/11/2021   Nuclear sclerotic cataract of both eyes 08/11/2021   Need for shingles vaccine 07/14/2021   Iron deficiency anemia 01/13/2021   Stage 4 chronic kidney disease (HCC) 07/09/2019   Type 2 diabetes mellitus with obesity (HCC) 07/02/2019   Polyneuropathy associated with underlying disease (HCC) 11/28/2018   Severe episode of recurrent major depressive disorder, without psychotic features (HCC) 11/28/2018   Type 2 diabetes mellitus with diabetic peripheral angiopathy without gangrene, without long-term current use of insulin (HCC) 11/28/2018   Poor short term memory 11/28/2018   Moderate aortic stenosis by prior echocardiogram 11/06/2018   Diabetes mellitus (HCC) 03/21/2017   Acute renal failure superimposed on stage 2 chronic kidney disease (HCC) 03/17/2016   Symptomatic anemia 03/17/2016   S/P total knee arthroplasty, right 03/17/2016   Unilateral primary osteoarthritis, right knee    Hyperlipidemia associated with type 2 diabetes mellitus (HCC) 12/15/2015   Pain in both lower extremities 11/19/2015   Numbness in both hands 08/13/2015   Panic  attack 06/09/2015   Right knee pain 05/14/2015   Chronic renal insufficiency 09/16/2014   Pneumothorax on right    Atypical mycobacterium infection    ILD (interstitial lung disease) 2/2 MAI s/p med rxn    Class 3 severe obesity with serious comorbidity and body mass index (BMI) of 50.0 to 59.9 in adult (HCC) 04/03/2014   OSA (obstructive sleep apnea) 02/25/2014   Hypertensive heart disease with chronic diastolic congestive heart failure (HCC) 02/25/2014   DOE (dyspnea on exertion) 02/25/2014   Essential hypertension 02/25/2014    PCP: Nadene Rubins  REFERRING PROVIDER: Reino Bellis  REFERRING DIAG:  [phone] (ICD-10-CM) - Cervical radiculopathy    THERAPY DIAG:  Radiculopathy, cervical region  Cervicalgia  Acute pain of right shoulder  Muscle weakness (generalized)  Rationale for Evaluation and Treatment: Rehabilitation  ONSET DATE: "a couple of weeks"  SUBJECTIVE:  SUBJECTIVE STATEMENT: R arm is hurting today down by the elbow  Hand dominance: Right  PERTINENT HISTORY:  Patient is a very pleasant right-hand-dominant 73 year old female that presents today with a couple of weeks of right arm pain.  Her pain is most noticeable when she holds the arm in a dependent position by her side.  Pain is alleviated when raising the arm directly overhead.  She describes it as a tingling type of sensation that localizes itself to the upper arm.  No pain past the elbow.  She denies any weakness in the arm.  No similar issues in the past. Patient will start physical therapy.  She takes 100 mg of gabapentin twice daily for neuropathy and I recommended that she increase that to 100 mg 3 times daily.  She will discuss that with her PCP.  If symptoms persist or worsen then consider imaging at that  time including x-ray and MRI.  Follow-up for ongoing or recalcitrant issues.   PAIN:  Are you having pain? Yes: NPRS scale: 7/10 Pain location: R elbow area Pain description: numbness, annoying, constant when I am in bed Aggravating factors: laying down, household chores, getting dressed  Relieving factors: nothing has been helping  PRECAUTIONS: None  RED FLAGS: None    WEIGHT BEARING RESTRICTIONS: No  FALLS:  Has patient fallen in last 6 months? Yes. Number of falls I had 1, I passed out this Saturday after I was sick and vomiting   LIVING ENVIRONMENT: Lives with: lives with their family Lives in: House/apartment Stairs: No Has following equipment at home: Environmental consultant - 2 wheeled  OCCUPATION: retired   PLOF: Independent  PATIENT GOALS: I want to release this as soon as I can   NEXT MD VISIT: have not scheduled yet  OBJECTIVE:  Note: Objective measures were completed at Evaluation unless otherwise noted.  DIAGNOSTIC FINDINGS:  N/A  COGNITION: Overall cognitive status: Within functional limits for tasks assessed  SENSATION: WFL  POSTURE: rounded shoulders and forward head  PALPATION: TTP and trigger points noted in R UT    CERVICAL ROM:   Active ROM A/PROM (deg) eval  Flexion WNL  Extension WNL pain at end range but nothing severe  Right lateral flexion 75%  Left lateral flexion 75% pain in R UT  Right rotation WNL  Left rotation WNL   (Blank rows = not tested)  UPPER EXTREMITY ROM: good overall ROM within functional limits    UPPER EXTREMITY MMT: grossly 4+/5 BUE, shoulder abd 3+ BUE with pain    CERVICAL SPECIAL TESTS:  Upper limb tension test (ULTT): Negative, Spurling's test: Positive, and Distraction test: Positive   TREATMENT DATE:  05/03/23 NuStep L5 x 6 min Sit to stand OHP red ball 2x8 Cervical retractions 2x10 Shoulder ER yellow 2x10 Shoulder Flex 2lb 2x10 Shoulder abd 2lb  2x10   04/26/23 AAROM Shoulder Flex, Ext, IR ups back  x10 NuStep L 5 x 6 min Rows & Ext red 2x10 Shoulder ER yellow 2x10    04/20/23- EVAL  PATIENT EDUCATION:  Education details: POC and HEP Person educated: Patient Education method: Explanation Education comprehension: verbalized understanding  HOME EXERCISE PROGRAM: Access Code: [email or web address] URL: [email or web address] Date: 04/20/2023 Prepared by: Cassie Freer  Exercises - Seated Upper Trapezius Stretch  - 1 x daily - 7 x weekly - 2 sets - 3 reps - 15 hold - Seated Levator Scapulae Stretch  - 1 x daily - 7 x weekly - 2 sets - 3 reps - 15 hold - Seated Cervical Retraction  - 1 x daily - 7 x weekly - 2 sets - 10 reps - Doorway Pec Stretch at 90 Degrees Abduction  - 1 x daily - 7 x weekly - 2 sets - 3 reps - 15 hold  ASSESSMENT:  CLINICAL IMPRESSION: Patient is a 73 y.o. female who was seen today for physical therapy treatment for cervical radiculopathy. She enters reporting R elbow pain.  Pt tolerated a progressed session that's consisted of posterior and postural strengthening. All exercises completed well without repots of increase pain. Postural cues needed with external rotation.  She will benefit from PT to address her neck and upper arm pain to allow her to sleep, get dressed, and complete her ADLS without pain.   OBJECTIVE IMPAIRMENTS: decreased ROM, decreased strength, impaired flexibility, postural dysfunction, and pain.   ACTIVITY LIMITATIONS: lifting, sleeping, and dressing  PARTICIPATION LIMITATIONS: cleaning, laundry, and driving  PERSONAL FACTORS: Age, Fitness, Past/current experiences, Time since onset of injury/illness/exacerbation, and 3+ comorbidities: CHF, DM, respiratory issues, HTN  are also affecting patient's functional outcome.   REHAB POTENTIAL: Good  CLINICAL DECISION MAKING: Stable/uncomplicated  EVALUATION  COMPLEXITY: Low  GOALS: Goals reviewed with patient? Yes  SHORT TERM GOALS: Target date: 05/18/23  Patient will be independent with initial HEP.  Baseline: HEP given 04/20/23 Goal status: Progressing 05/03/23   LONG TERM GOALS: Target date:06/15/23  Patient will be independent with advanced/ongoing HEP to improve outcomes and carryover.  Baseline:  Goal status: INITIAL  2.  Patient will report 75% improvement in neck and upper arm pain to improve QOL.  Baseline: 10/10 esp at night Goal status: INITIAL  3.  Patient will demonstrate full pain free cervical ROM  Baseline: see chart Goal status: INITIAL  4.  Patient will able to sleep throughout the night without increase is pain and waking up due to arm pain Baseline: unable to sleep at night Goal status: INITIAL  PLAN:  PT FREQUENCY: 2x/week  PT DURATION: 8 weeks  PLANNED INTERVENTIONS: 97110-Therapeutic exercises, 97530- Therapeutic activity, 97112- Neuromuscular re-education, 97535- Self Care, 54098- Manual therapy, Patient/Family education, Taping, Dry Needling, Joint mobilization, Spinal mobilization, Cryotherapy, and Moist heat  PLAN FOR NEXT SESSION: manual therapy for neck, stretching for neck, DN if she wants to try it   Grayce Sessions, PTA 05/03/2023, 8:43 AM

## 2023-05-03 NOTE — Telephone Encounter (Signed)
 Received Patient port. Of [place] application for [place].  Waiting on Provider page.

## 2023-05-04 ENCOUNTER — Encounter: Payer: Self-pay | Admitting: Internal Medicine

## 2023-05-04 ENCOUNTER — Ambulatory Visit (INDEPENDENT_AMBULATORY_CARE_PROVIDER_SITE_OTHER): Admitting: Internal Medicine

## 2023-05-04 ENCOUNTER — Other Ambulatory Visit (HOSPITAL_COMMUNITY): Payer: Self-pay

## 2023-05-04 VITALS — BP 136/80 | HR 66 | Temp 97.8°F | Ht 68.0 in | Wt 322.8 lb

## 2023-05-04 DIAGNOSIS — N898 Other specified noninflammatory disorders of vagina: Secondary | ICD-10-CM | POA: Diagnosis not present

## 2023-05-04 DIAGNOSIS — R300 Dysuria: Secondary | ICD-10-CM

## 2023-05-04 LAB — POC URINALSYSI DIPSTICK (AUTOMATED)
Bilirubin, UA: NEGATIVE
Blood, UA: NEGATIVE
Glucose, UA: POSITIVE — AB
Ketones, UA: NEGATIVE
Nitrite, UA: NEGATIVE
Protein, UA: NEGATIVE
Spec Grav, UA: 1.025 (ref 1.010–1.025)
Urobilinogen, UA: 0.2 U/dL
pH, UA: 6 (ref 5.0–8.0)

## 2023-05-04 MED ORDER — CEPHALEXIN 250 MG PO CAPS
250.0000 mg | ORAL_CAPSULE | Freq: Two times a day (BID) | ORAL | 0 refills | Status: AC
  Filled 2023-05-04: qty 14, 7d supply, fill #0

## 2023-05-04 NOTE — Progress Notes (Signed)
 [facility] [address] [place] [address] Dept: [phone] Dept Fax: [phone]  Acute Care Office Visit  Subjective:   Lus Kriegel [birth year] 05/04/2023  Chief Complaint  Patient presents with   Urinary Tract Infection    Symptoms started over the weekend     HPI: Discussed the use of AI scribe software for clinical note transcription with the patient, who gave verbal consent to proceed.  History of Present Illness   The patient, with a history of diabetes, congestive heart failure, hypertension, and kidney disease stage 4, presents with a week-long history of genital discomfort. She describes the sensation as a burning feeling, particularly when wiping after urination. The discomfort is localized to the upper part of the genital area. She denies dysuria, hematuria, fever, chills, and vaginal discharge. She has been applying A and D ointment, which has provided some relief. She also reports frequent urination due to a diuretic medication she is taking. She has chronic back pain, which she attributes to her various medical conditions and medications. She also mentions a sensation of heat in the genital area, which she describes as being constantly present.      The following portions of the patient's history were reviewed and updated as appropriate: past medical history, past surgical history, family history, social history, allergies, medications, and problem list.   Patient Active Problem List   Diagnosis Date Noted   Subacromial bursitis of right shoulder joint 04/18/2023   Asthma 03/03/2023   Urinary frequency 02/03/2023   Viral upper respiratory tract infection 12/20/2022   Dysthymia 02/08/2022   Hypokalemia 01/26/2022   Mild pulmonary hypertension (HCC) 01/09/2022   Need for influenza vaccination 12/02/2021   Dysfunction of left eustachian tube 12/02/2021   Allergic rhinitis 12/02/2021   Chronic  respiratory failure with hypoxia (HCC) 11/11/2021   Demand ischemia (HCC)    Respiratory acidosis    Acute respiratory failure with hypercapnia (HCC)    Acute on chronic heart failure with preserved ejection fraction (HCC)    CHF (congestive heart failure) (HCC) 10/09/2021   Reactive airway disease 09/21/2021   Viral syndrome 09/21/2021   Hypertensive retinopathy of right eye, grade 1 08/11/2021   Grade 2 hypertensive retinopathy, left 08/11/2021   Nuclear sclerotic cataract of both eyes 08/11/2021   Need for shingles vaccine 07/14/2021   Iron deficiency anemia 01/13/2021   Stage 4 chronic kidney disease (HCC) 07/09/2019   Type 2 diabetes mellitus with obesity (HCC) 07/02/2019   Polyneuropathy associated with underlying disease (HCC) 11/28/2018   Severe episode of recurrent major depressive disorder, without psychotic features (HCC) 11/28/2018   Type 2 diabetes mellitus with diabetic peripheral angiopathy without gangrene, without long-term current use of insulin (HCC) 11/28/2018   Poor short term memory 11/28/2018   Moderate aortic stenosis by prior echocardiogram 11/06/2018   Diabetes mellitus (HCC) 03/21/2017   Acute renal failure superimposed on stage 2 chronic kidney disease (HCC) 03/17/2016   Symptomatic anemia 03/17/2016   S/P total knee arthroplasty, right 03/17/2016   Unilateral primary osteoarthritis, right knee    Hyperlipidemia associated with type 2 diabetes mellitus (HCC) 12/15/2015   Pain in both lower extremities 11/19/2015   Numbness in both hands 08/13/2015   Panic attack 06/09/2015   Right knee pain 05/14/2015   Chronic renal insufficiency 09/16/2014   Pneumothorax on right    Atypical mycobacterium infection    ILD (interstitial lung disease) 2/2 MAI s/p med rxn    Class 3 severe obesity  with serious comorbidity and body mass index (BMI) of 50.0 to 59.9 in adult (HCC) 04/03/2014   OSA (obstructive sleep apnea) 02/25/2014   Hypertensive heart disease with  chronic diastolic congestive heart failure (HCC) 02/25/2014   DOE (dyspnea on exertion) 02/25/2014   Essential hypertension 02/25/2014   Past Medical History:  Diagnosis Date   Anxiety    doesn't take any meds   Arthritis of right knee 03/14/2016   Asthma    Back pain    Chronic diastolic CHF (congestive heart failure) (HCC)    HF with Preserved EF (60-65%) - Grade II Diastolic Dysfunction (Hypertensive Heart Disease). takes Furosemide daily   Depression    doesn't take meds   Diabetes (HCC)    takes Januvia daily   Eczema    uses cream as needed   GERD (gastroesophageal reflux disease)    History of bronchitis as a child    HTN (hypertension)    Insomnia    Moderate aortic stenosis by prior echocardiogram 04/2017   10/15/2021: Georgina Quint calcification with moderate AS, MG 23 mmHg   Mycobacterium avium-intracellulare infection (HCC) 2016   OA (osteoarthritis)    Obese    OSA (obstructive sleep apnea) 02/25/2014   wears CPAP at night   Peripheral neuropathy    takes Gabapentin as needed   Pneumonia    hx of-2010   Seasonal allergies    uses Flonase daily   Sleep apnea    Past Surgical History:  Procedure Laterality Date   BREAST BIOPSY Right 03/2018   BREAST LUMPECTOMY WITH RADIOACTIVE SEED LOCALIZATION Right 12/28/2018   Procedure: RIGHT BREAST LUMPECTOMY WITH RADIOACTIVE SEED LOCALIZATION;  Surgeon: Griselda Miner, MD;  Location: [facility] OR;  Service: General;  Laterality: Right;   BREAST SURGERY     CARDIOPULMONARY EXERCISE TEST (CPX)  06/21/2021   (Performed on Coreg 25 mg twice daily): PFTs: FVC 1.76 (60%), FEV1 1.59 (77%), ratio 114% => RESTRICTIVE PHYSIOLOGY; 5 min- 1 mph - 2% grade. 7/10 dyspnea -> pulse ox range during exercise 95 to 98%, low of 90%, 97% on recovery; BP 106/84-160/64; heart rate 64-90 bpm (60% MPHR) -> Chronotropic Incompetence;;MAIN FACTOR for Exercise Limitation = BODY HABITUS w/ Chronotropic Incompetence.   CESAREAN SECTION  x2   COLONOSCOPY      CORONARY CALCIUM SCORE & CT ANGIOGRAM  09/2017   Coronary Ca Score = 11 (low).  CTA- no obstructive CAD (minimal disease)   JOINT REPLACEMENT     KNEE ARTHROSCOPY Right    LUNG BIOPSY Right 06/10/2014   Procedure: LUNG BIOPSY;  Surgeon: Kerin Perna, MD;  Location: [facility] OR;  Service: Thoracic;  Laterality: Right;   POLYPECTOMY     throat   RIGHT HEART CATH N/A 10/15/2021   Procedure: RIGHT HEART CATH;  Surgeon: Lyn Records, MD;  Location: [facility] INVASIVE CV LAB;  Service: CV: Mild Pulmonary Hypertension (WHO Group 3).  Mean PAP 28 mmHg, PCWP 13 mmHg.  PVR 2.73 Woods.  RAP 6 mmHg.CO-CI 5.5L/min, 2.17 L/min/m.   TOTAL KNEE ARTHROPLASTY Right 03/14/2016   Procedure: RIGHT TOTAL KNEE ARTHROPLASTY;  Surgeon: Eldred Manges, MD;  Location: [facility] OR;  Service: Orthopedics;  Laterality: Right;   TRANSTHORACIC ECHOCARDIOGRAM  10/10/2021   EF 70 to 75%.  Hyperdynamic with no RWMA.  Moderate LVH.  Moderate LA dilation indicating likely diastolic dysfunction although not interpretable.  Moderately elevated PAP with normal RV function, dilated IVC with elevated RAP estimated at 15 mmHg..  Moderate MAC with no MR.  AOV calcification with Moderate AS-mean AvG 23 mmHg.   TRANSTHORACIC ECHOCARDIOGRAM  06/14/2021   EF 60 to 65%.  No RWMA.  Moderate cLVH with GR 1 DD.  Mild LA dilation.  Mild to moderate aortic calcification/stenosis.  Mean AVG 18 mmHg.   VIDEO ASSISTED THORACOSCOPY Right 06/10/2014   Procedure: VIDEO ASSISTED THORACOSCOPY;  Surgeon: Kerin Perna, MD;  Location: [facility] OR;  Service: Thoracic;  Laterality: Right;   Family History  Problem Relation Age of Onset   COPD Father    Hypothyroidism Father    Anemia Father        iron deficiency   High blood pressure Father    Alcoholism Father    Cancer Mother 39       pancreatic   High blood pressure Mother    High Cholesterol Mother    Breast cancer Neg Hx     Current Outpatient Medications:    Accu-Chek Softclix Lancets lancets, May check  glucose up to 3 times daily., Disp: 100 each, Rfl: 12   albuterol (VENTOLIN HFA) 108 (90 Base) MCG/ACT inhaler, INHALE 1-2 PUFFS into THE lungs EVERY 6 HOURS AS NEEDED FOR WHEEZING AND/OR SHORTNESS OF BREATH, Disp: 6.7 g, Rfl: 3   aspirin 81 MG tablet, Take 1 tablet (81 mg total) by mouth daily with breakfast., Disp: 90 tablet, Rfl: 0   atorvastatin (LIPITOR) 20 MG tablet, Take 1 tablet (20 mg total) by mouth daily., Disp: 90 tablet, Rfl: 3   Blood Glucose Monitoring Suppl (ONETOUCH VERIO SYNC SYSTEM) w/Device KIT, 1 kit by Does not apply route daily as needed., Disp: 1 kit, Rfl: 0   carvedilol (COREG) 25 MG tablet, Take 1 tablet (25 mg total) by mouth 2 (two) times daily., Disp: 180 tablet, Rfl: 3   cephALEXin (KEFLEX) 250 MG capsule, Take 1 capsule (250 mg total) by mouth 2 (two) times daily for 7 days., Disp: 14 capsule, Rfl: 0   cholecalciferol (VITAMIN D3) 25 MCG (1000 UNIT) tablet, Take 2,000 Units by mouth daily., Disp: , Rfl:    dapagliflozin propanediol (FARXIGA) 5 MG TABS tablet, Take 1 tablet (5 mg total) by mouth daily before breakfast., Disp: 90 tablet, Rfl: 2   Docusate Sodium 100 MG capsule, Take 100 mg by mouth 2 (two) times daily., Disp: , Rfl:    ferrous sulfate 325 (65 FE) MG tablet, Take 325 mg by mouth 2 (two) times daily with a meal., Disp: , Rfl:    fluticasone (FLONASE) 50 MCG/ACT nasal spray, Place 1 spray into both nostrils daily., Disp: 48 g, Rfl: 1   furosemide (LASIX) 80 MG tablet, Take 1 tablet (80 mg total) by mouth daily., Disp: 90 tablet, Rfl: 1   gabapentin (NEURONTIN) 100 MG capsule, Take 1 capsule (100 mg total) by mouth 2 (two) times daily., Disp: 180 capsule, Rfl: 1   glucose blood (ONETOUCH VERIO) test strip, Use to monitor blood sugar twice daily as instructed, Disp: 200 each, Rfl: 3   hydrALAZINE (APRESOLINE) 50 MG tablet, Take 1 tablet (50 mg total) by mouth every 8 (eight) hours., Disp: 90 tablet, Rfl: 3   isosorbide dinitrate (ISORDIL) 10 MG tablet, Take 1  tablet (10 mg total) by mouth 3 (three) times daily. Please keep scheduled appointment, Disp: 270 tablet, Rfl: 3   losartan (COZAAR) 25 MG tablet, Take 0.5 tablets (12.5 mg total) by mouth daily., Disp: 45 tablet, Rfl: 3   montelukast (SINGULAIR) 10 MG tablet, Take 1 tablet (10 mg total) by mouth at bedtime.,  Disp: 90 tablet, Rfl: 3   pantoprazole (PROTONIX) 40 MG tablet, Take 1 tablet (40 mg total) by mouth daily., Disp: 90 tablet, Rfl: 1   potassium chloride (KLOR-CON M) 10 MEQ tablet, Take 1 tablet (10 mEq total) by mouth daily., Disp: 60 tablet, Rfl: 2   Semaglutide, 1 MG/DOSE, 4 MG/3ML SOPN, Inject 1 mg as directed once a week., Disp: 9 mL, Rfl: 0 No Known Allergies   ROS: A complete ROS was performed with pertinent positives/negatives noted in the HPI. The remainder of the ROS are negative.    Objective:   Today's Vitals   05/04/23 0902  BP: 136/80  Pulse: 66  Temp: 97.8 F (36.6 C)  TempSrc: Temporal  SpO2: 99%  Weight: (!) 322 lb 12.8 oz (146.4 kg)  Height: 5\' 8"  (1.727 m)    GENERAL: Well-appearing, in NAD. Well nourished.  SKIN: Pink, warm and dry.  NECK: Trachea midline. Full ROM w/o pain or tenderness. No lymphadenopathy.  RESPIRATORY: Chest wall symmetrical. Respirations even and non-labored. Breath sounds clear to auscultation bilaterally.  CARDIAC: S1, S2 present, regular rate and rhythm. Peripheral pulses 2+ bilaterally.  GU: patient refused exam.  EXTREMITIES: Without clubbing, cyanosis, or edema.  NEUROLOGIC: Steady, even gait.  PSYCH/MENTAL STATUS: Alert, oriented x 3. Cooperative, appropriate mood and affect.    Results for orders placed or performed in visit on 05/04/23  POCT Urinalysis Dipstick (Automated)  Result Value Ref Range   Color, UA yellow    Clarity, UA clear    Glucose, UA Positive (A) Negative   Bilirubin, UA neg    Ketones, UA neg    Spec Grav, UA 1.025 1.010 - 1.025   Blood, UA neg    pH, UA 6.0 5.0 - 8.0   Protein, UA Negative  Negative   Urobilinogen, UA 0.2 0.2 or 1.0 E.U./dL   Nitrite, UA neg    Leukocytes, UA Moderate (2+) (A) Negative      Assessment & Plan:  Assessment and Plan    Dysuria  - Send urine for culture to confirm bacterial growth. - Prescribe Keflex 250mg  BID x 7 days - dose adjusted based on CKD - Continue A&D ointment application morning and evening. - Advise airing out area at night.  Vaginal Irritation - Unable to assess to determine possible cause as patient refused assessment of genital area.  Irritation likely due to skin irritation rather than UTI. A&D ointment provided relief. - Continue A&D ointment application morning and evening. - Advise airing out area at night.  Follow-up Will contact with urine culture results to confirm UTI and adjust treatment. - Contact with urine culture results. - Adjust treatment plan based on culture results if necessary.       Meds ordered this encounter  Medications   cephALEXin (KEFLEX) 250 MG capsule    Sig: Take 1 capsule (250 mg total) by mouth 2 (two) times daily for 7 days.    Dispense:  14 capsule    Refill:  0    Supervising Provider:   Garnette Gunner [[id number]]   Orders Placed This Encounter  Procedures   Urine Culture   POCT Urinalysis Dipstick (Automated)   Lab Orders         Urine Culture         POCT Urinalysis Dipstick (Automated)     No images are attached to the encounter or orders placed in the encounter.  Return if symptoms worsen or fail to improve.   Salvatore Decent, FNP

## 2023-05-04 NOTE — Patient Instructions (Signed)
 A&D ointment to affected area 2 times a day  Air out area at nighttime

## 2023-05-05 LAB — URINE CULTURE
MICRO NUMBER:: 16256120
SPECIMEN QUALITY:: ADEQUATE

## 2023-05-08 ENCOUNTER — Encounter: Payer: Self-pay | Admitting: Internal Medicine

## 2023-05-08 ENCOUNTER — Other Ambulatory Visit (HOSPITAL_COMMUNITY): Payer: Self-pay

## 2023-05-08 NOTE — Telephone Encounter (Signed)
 PAP: Patient assistance application for Stacey Page has been approved by PAP Companies: [employer] from 05/08/2023 to 02/07/2024. Medication should be delivered to PAP Delivery: Home. For further shipping updates, please contact [employer] ([employer]) at [phone]. Patient ID is: PEP-ID [id number]  Allow 7-10 business days for shipment to process.

## 2023-05-08 NOTE — Telephone Encounter (Signed)
 PAP: Application for Stacey Page has been submitted to [employer] ([employer]), via fax  PAP: Application for Ozempic has been submitted to [employer], via fax

## 2023-05-09 ENCOUNTER — Ambulatory Visit: Attending: Sports Medicine | Admitting: Physical Therapy

## 2023-05-09 ENCOUNTER — Encounter: Payer: Self-pay | Admitting: Physical Therapy

## 2023-05-09 DIAGNOSIS — M5412 Radiculopathy, cervical region: Secondary | ICD-10-CM | POA: Diagnosis not present

## 2023-05-09 DIAGNOSIS — M25511 Pain in right shoulder: Secondary | ICD-10-CM | POA: Diagnosis not present

## 2023-05-09 DIAGNOSIS — M6281 Muscle weakness (generalized): Secondary | ICD-10-CM | POA: Insufficient documentation

## 2023-05-09 DIAGNOSIS — M542 Cervicalgia: Secondary | ICD-10-CM | POA: Diagnosis not present

## 2023-05-09 NOTE — Therapy (Signed)
 OUTPATIENT PHYSICAL THERAPY CERVICAL TREATMENT   Patient Name: Stacey Page MRN: [id number] DOB:03-15-1950, 73 y.o., female Today's Date: 05/09/2023  END OF SESSION:  PT End of Session - 05/09/23 1318     Visit Number 4    Date for PT Re-Evaluation 06/15/23    Authorization Type HTA    PT Start Time 1302    PT Stop Time 1340    PT Time Calculation (min) 38 min    Activity Tolerance Patient tolerated treatment well    Behavior During Therapy WFL for tasks assessed/performed              Past Medical History:  Diagnosis Date   Anxiety    doesn't take any meds   Arthritis of right knee 03/14/2016   Asthma    Back pain    Chronic diastolic CHF (congestive heart failure) (HCC)    HF with Preserved EF (60-65%) - Grade II Diastolic Dysfunction (Hypertensive Heart Disease). takes Furosemide daily   Depression    doesn't take meds   Diabetes (HCC)    takes Januvia daily   Eczema    uses cream as needed   GERD (gastroesophageal reflux disease)    History of bronchitis as a child    HTN (hypertension)    Insomnia    Moderate aortic stenosis by prior echocardiogram 04/2017   10/15/2021: Georgina Quint calcification with moderate AS, MG 23 mmHg   Mycobacterium avium-intracellulare infection (HCC) 2016   OA (osteoarthritis)    Obese    OSA (obstructive sleep apnea) 02/25/2014   wears CPAP at night   Peripheral neuropathy    takes Gabapentin as needed   Pneumonia    hx of-2010   Seasonal allergies    uses Flonase daily   Sleep apnea    Past Surgical History:  Procedure Laterality Date   BREAST BIOPSY Right 03/2018   BREAST LUMPECTOMY WITH RADIOACTIVE SEED LOCALIZATION Right 12/28/2018   Procedure: RIGHT BREAST LUMPECTOMY WITH RADIOACTIVE SEED LOCALIZATION;  Surgeon: Griselda Miner, MD;  Location: [facility] OR;  Service: General;  Laterality: Right;   BREAST SURGERY     CARDIOPULMONARY EXERCISE TEST (CPX)  06/21/2021   (Performed on Coreg 25 mg twice daily): PFTs: FVC 1.76  (60%), FEV1 1.59 (77%), ratio 114% => RESTRICTIVE PHYSIOLOGY; 5 min- 1 mph - 2% grade. 7/10 dyspnea -> pulse ox range during exercise 95 to 98%, low of 90%, 97% on recovery; BP 106/84-160/64; heart rate 64-90 bpm (60% MPHR) -> Chronotropic Incompetence;;MAIN FACTOR for Exercise Limitation = BODY HABITUS w/ Chronotropic Incompetence.   CESAREAN SECTION  x2   COLONOSCOPY     CORONARY CALCIUM SCORE & CT ANGIOGRAM  09/2017   Coronary Ca Score = 11 (low).  CTA- no obstructive CAD (minimal disease)   JOINT REPLACEMENT     KNEE ARTHROSCOPY Right    LUNG BIOPSY Right 06/10/2014   Procedure: LUNG BIOPSY;  Surgeon: Kerin Perna, MD;  Location: [facility] OR;  Service: Thoracic;  Laterality: Right;   POLYPECTOMY     throat   RIGHT HEART CATH N/A 10/15/2021   Procedure: RIGHT HEART CATH;  Surgeon: Lyn Records, MD;  Location: [facility] INVASIVE CV LAB;  Service: CV: Mild Pulmonary Hypertension (WHO Group 3).  Mean PAP 28 mmHg, PCWP 13 mmHg.  PVR 2.73 [place].  RAP 6 mmHg.CO-CI 5.5L/min, 2.17 L/min/m.   TOTAL KNEE ARTHROPLASTY Right 03/14/2016   Procedure: RIGHT TOTAL KNEE ARTHROPLASTY;  Surgeon: Eldred Manges, MD;  Location: [facility] OR;  Service: Orthopedics;  Laterality: Right;   TRANSTHORACIC ECHOCARDIOGRAM  10/10/2021   EF 70 to 75%.  Hyperdynamic with no RWMA.  Moderate LVH.  Moderate LA dilation indicating likely diastolic dysfunction although not interpretable.  Moderately elevated PAP with normal RV function, dilated IVC with elevated RAP estimated at 15 mmHg..  Moderate MAC with no MR.  AOV calcification with Moderate AS-mean AvG 23 mmHg.   TRANSTHORACIC ECHOCARDIOGRAM  06/14/2021   EF 60 to 65%.  No RWMA.  Moderate cLVH with GR 1 DD.  Mild LA dilation.  Mild to moderate aortic calcification/stenosis.  Mean AVG 18 mmHg.   VIDEO ASSISTED THORACOSCOPY Right 06/10/2014   Procedure: VIDEO ASSISTED THORACOSCOPY;  Surgeon: Kerin Perna, MD;  Location: [facility] OR;  Service: Thoracic;  Laterality: Right;   Patient Active  Problem List   Diagnosis Date Noted   Subacromial bursitis of right shoulder joint 04/18/2023   Asthma 03/03/2023   Urinary frequency 02/03/2023   Viral upper respiratory tract infection 12/20/2022   Dysthymia 02/08/2022   Hypokalemia 01/26/2022   Mild pulmonary hypertension (HCC) 01/09/2022   Need for influenza vaccination 12/02/2021   Dysfunction of left eustachian tube 12/02/2021   Allergic rhinitis 12/02/2021   Chronic respiratory failure with hypoxia (HCC) 11/11/2021   Demand ischemia (HCC)    Respiratory acidosis    Acute respiratory failure with hypercapnia (HCC)    Acute on chronic heart failure with preserved ejection fraction (HCC)    CHF (congestive heart failure) (HCC) 10/09/2021   Reactive airway disease 09/21/2021   Viral syndrome 09/21/2021   Hypertensive retinopathy of right eye, grade 1 08/11/2021   Grade 2 hypertensive retinopathy, left 08/11/2021   Nuclear sclerotic cataract of both eyes 08/11/2021   Need for shingles vaccine 07/14/2021   Iron deficiency anemia 01/13/2021   Stage 4 chronic kidney disease (HCC) 07/09/2019   Type 2 diabetes mellitus with obesity (HCC) 07/02/2019   Polyneuropathy associated with underlying disease (HCC) 11/28/2018   Severe episode of recurrent major depressive disorder, without psychotic features (HCC) 11/28/2018   Type 2 diabetes mellitus with diabetic peripheral angiopathy without gangrene, without long-term current use of insulin (HCC) 11/28/2018   Poor short term memory 11/28/2018   Moderate aortic stenosis by prior echocardiogram 11/06/2018   Diabetes mellitus (HCC) 03/21/2017   Acute renal failure superimposed on stage 2 chronic kidney disease (HCC) 03/17/2016   Symptomatic anemia 03/17/2016   S/P total knee arthroplasty, right 03/17/2016   Unilateral primary osteoarthritis, right knee    Hyperlipidemia associated with type 2 diabetes mellitus (HCC) 12/15/2015   Pain in both lower extremities 11/19/2015   Numbness in both  hands 08/13/2015   Panic attack 06/09/2015   Right knee pain 05/14/2015   Chronic renal insufficiency 09/16/2014   Pneumothorax on right    Atypical mycobacterium infection    ILD (interstitial lung disease) 2/2 MAI s/p med rxn    Class 3 severe obesity with serious comorbidity and body mass index (BMI) of 50.0 to 59.9 in adult (HCC) 04/03/2014   OSA (obstructive sleep apnea) 02/25/2014   Hypertensive heart disease with chronic diastolic congestive heart failure (HCC) 02/25/2014   DOE (dyspnea on exertion) 02/25/2014   Essential hypertension 02/25/2014    PCP: Nadene Rubins  REFERRING PROVIDER: Reino Bellis  REFERRING DIAG:  [id number] (ICD-10-CM) - Cervical radiculopathy    THERAPY DIAG:  Radiculopathy, cervical region  Cervicalgia  Acute pain of right shoulder  Muscle weakness (generalized)  Rationale for Evaluation and Treatment: Rehabilitation  ONSET DATE: "a couple of  weeks"  SUBJECTIVE:                                                                                                                                                                                                         SUBJECTIVE STATEMENT:  I'm feeling better, I've come a long way. Not having much pain when I lay down, but last time I left I was hurting like I was using some muscles I haven't used for a long time, it was good for me    Hand dominance: Right  PERTINENT HISTORY:  Patient is a very pleasant right-hand-dominant 73 year old female that presents today with a couple of weeks of right arm pain.  Her pain is most noticeable when she holds the arm in a dependent position by her side.  Pain is alleviated when raising the arm directly overhead.  She describes it as a tingling type of sensation that localizes itself to the upper arm.  No pain past the elbow.  She denies any weakness in the arm.  No similar issues in the past. Patient will start physical therapy.  She takes 100 mg of gabapentin  twice daily for neuropathy and I recommended that she increase that to 100 mg 3 times daily.  She will discuss that with her PCP.  If symptoms persist or worsen then consider imaging at that time including x-ray and MRI.  Follow-up for ongoing or recalcitrant issues.   PAIN:  Are you having pain? Yes: NPRS scale: 5/10, occasionally 7/10 Pain location: neck going down to the wrist  Pain description: sharp then goes numb  Aggravating factors: laying down, household chores, getting dressed  Relieving factors: PT   PRECAUTIONS: None  RED FLAGS: None    WEIGHT BEARING RESTRICTIONS: No  FALLS:  Has patient fallen in last 6 months? Yes. Number of falls I had 1, I passed out this Saturday after I was sick and vomiting   LIVING ENVIRONMENT: Lives with: lives with their family Lives in: House/apartment Stairs: No Has following equipment at home: Environmental consultant - 2 wheeled  OCCUPATION: retired   PLOF: Independent  PATIENT GOALS: I want to release this as soon as I can   NEXT MD VISIT: have not scheduled yet  OBJECTIVE:  Note: Objective measures were completed at Evaluation unless otherwise noted.  DIAGNOSTIC FINDINGS:  N/A  COGNITION: Overall cognitive status: Within functional limits for tasks assessed  SENSATION: WFL  POSTURE: rounded shoulders and forward head  PALPATION: TTP and trigger points noted in R UT    CERVICAL ROM:   Active  ROM A/PROM (deg) eval  Flexion WNL  Extension WNL pain at end range but nothing severe  Right lateral flexion 75%  Left lateral flexion 75% pain in R UT  Right rotation WNL  Left rotation WNL   (Blank rows = not tested)  UPPER EXTREMITY ROM: good overall ROM within functional limits    UPPER EXTREMITY MMT: grossly 4+/5 BUE, shoulder abd 3+ BUE with pain    CERVICAL SPECIAL TESTS:  Upper limb tension test (ULTT): Negative, Spurling's test: Positive, and Distraction test: Positive   TREATMENT DATE:   05/09/23  UBE L2 x3 min  forward/3 min backward (one minute rest after changing directions)  Chin tucks x15  Chin tuck + rotation x10 B Chin tuck + lateral flexion x10 B Scap retractions x12  Backward shoulder rolls x15  Shoulder flexion 2# 2x10  Shoulder abd 2# 2x10       05/03/23 NuStep L5 x 6 min Sit to stand OHP red ball 2x8 Cervical retractions 2x10 Shoulder ER yellow 2x10 Shoulder Flex 2lb 2x10 Shoulder abd 2lb  2x10   04/26/23 AAROM Shoulder Flex, Ext, IR ups back x10 NuStep L 5 x 6 min Rows & Ext red 2x10 Shoulder ER yellow 2x10    04/20/23- EVAL                                                                                                                                 PATIENT EDUCATION:  Education details: POC and HEP Person educated: Patient Education method: Explanation Education comprehension: verbalized understanding  HOME EXERCISE PROGRAM: Access Code: [email or web address] URL: [email or web address] Date: 04/20/2023 Prepared by: Cassie Freer  Exercises - Seated Upper Trapezius Stretch  - 1 x daily - 7 x weekly - 2 sets - 3 reps - 15 hold - Seated Levator Scapulae Stretch  - 1 x daily - 7 x weekly - 2 sets - 3 reps - 15 hold - Seated Cervical Retraction  - 1 x daily - 7 x weekly - 2 sets - 10 reps - Doorway Pec Stretch at 90 Degrees Abduction  - 1 x daily - 7 x weekly - 2 sets - 3 reps - 15 hold  ASSESSMENT:  CLINICAL IMPRESSION:  Pt arrives today reporting she has started feeling better. We continued working on postural training and strengthening today as tolerated, did well but she still reports some issues with pain increasing when she is trying to sleep. Very deconditioned and easily fatigued. Will continue to progress and assess.    OBJECTIVE IMPAIRMENTS: decreased ROM, decreased strength, impaired flexibility, postural dysfunction, and pain.   ACTIVITY LIMITATIONS: lifting, sleeping, and dressing  PARTICIPATION LIMITATIONS: cleaning, laundry, and  driving  PERSONAL FACTORS: Age, Fitness, Past/current experiences, Time since onset of injury/illness/exacerbation, and 3+ comorbidities: CHF, DM, respiratory issues, HTN  are also affecting patient's functional outcome.   REHAB POTENTIAL: Good  CLINICAL DECISION MAKING: Stable/uncomplicated  EVALUATION COMPLEXITY: Low  GOALS: Goals reviewed with  patient? Yes  SHORT TERM GOALS: Target date: 05/18/23  Patient will be independent with initial HEP.  Baseline: HEP given 04/20/23 Goal status: Progressing 05/03/23   LONG TERM GOALS: Target date:06/15/23  Patient will be independent with advanced/ongoing HEP to improve outcomes and carryover.  Baseline:  Goal status: INITIAL  2.  Patient will report 75% improvement in neck and upper arm pain to improve QOL.  Baseline: 10/10 esp at night Goal status: INITIAL  3.  Patient will demonstrate full pain free cervical ROM  Baseline: see chart Goal status: INITIAL  4.  Patient will able to sleep throughout the night without increase is pain and waking up due to arm pain Baseline: unable to sleep at night Goal status: INITIAL  PLAN:  PT FREQUENCY: 2x/week  PT DURATION: 8 weeks  PLANNED INTERVENTIONS: 97110-Therapeutic exercises, 97530- Therapeutic activity, 97112- Neuromuscular re-education, 97535- Self Care, 40981- Manual therapy, Patient/Family education, Taping, Dry Needling, Joint mobilization, Spinal mobilization, Cryotherapy, and Moist heat  PLAN FOR NEXT SESSION: manual therapy for neck, stretching for neck, DN if she wants to try it, progress postural training   Nedra Hai, PT, DPT 05/09/23 1:40 PM

## 2023-05-10 NOTE — Progress Notes (Signed)
 Pharmacy Medication Assistance Program Note    05/10/2023  Patient ID: Stacey Page, female  DOB: 05-Feb-1951, 73 y.o.  MRN:  [id number]     05/10/2023  Outreach Medication Two  Initial Outreach Date (Medication Two) 04/20/2023  Manufacturer Medication Two Novo Nordisk  N[employer] Drugs Ozempic  Dose of Ozempic 1 mg  Type of Radiographer, therapeutic Assistance  Date Application Sent to Patient 04/20/2023  Application Items Requested Application  Date Application Sent to Prescriber 04/20/2023  Name of Prescriber Nadene Rubins  Application Items Received From Patient Application  Date Application Received From Provider 05/05/2023  Method Application Sent to Manufacturer Fax  Date Application Submitted to Manufacturer 05/08/2023  Patient Assistance Determination Approved  Approval Start Date 05/09/2023  Patient Notification Method MyChart     Signature

## 2023-05-10 NOTE — Telephone Encounter (Addendum)
 PAP: Patient assistance application for Ozempic has been approved by PAP Companies: NovoNordisk from 05/09/2023 to 05/03/2024. Medication should be delivered to PAP Delivery: Provider's office. For further shipping updates, please [employer] at [phone]. Patient ID is: [id number]   Correct date of approval is until 05/03/2024

## 2023-05-11 ENCOUNTER — Ambulatory Visit: Admitting: Physical Therapy

## 2023-05-12 ENCOUNTER — Ambulatory Visit: Payer: Self-pay

## 2023-05-12 NOTE — Patient Outreach (Signed)
 Care Coordination   Follow Up Visit Note   05/12/2023 Name: Stacey Page MRN: [id number] DOB: 06-04-1950  Stacey Page is a 73 y.o. year old female who sees Stacey Sax, MD for primary care. I spoke with  Stacey Page by phone today.  What matters to the patients health and wellness today?  In my recent discussion with Stacey Page, she indicated that she is feeling well. She has successfully modified her eating habits and has lost 7 pounds. Furthermore, she is currently participating in physical therapy and is making an effort to increase her walking. Stacey Page has denied shortness of breath, chest pain, or swelling is adhering to her medication regimen as prescribed, and is attending all scheduled appointments. I have informed her that I will no longer serve as her care coordinator and that Stacey Favors, RN, will assume responsibility for her care. Stacey Page has an arranged appointment with Stacey Page.    Goals Addressed             This Visit's Progress    I want to monitor and manage my CHF       Patient Goals/Self Care Activities: -Patient/Caregiver will take medications as prescribed   -Patient/Caregiver will attend all scheduled provider appointments -Patient/Caregiver will call pharmacy for medication refills 3-7 days in advance of running out of medications -Patient/Caregiver will call provider office for new concerns or questions  -Patient/Caregiver will focus on medication adherence by taking medications as prescribed   -Weigh daily and record (notify MD with 3 lb weight gain over night or 5 lb in a week) -Follow CHF Action Plan- do a check daily -continue to exercise 15 minutes /day split into 5 minute increments Wt Readings from Last 3 Encounters:  05/04/23 (!) 322 lb 12.8 oz (146.4 kg)  05/02/23 (!) 315 lb (142.9 kg)  04/18/23 (!) 324 lb (147 kg)  -Continue with your positive eating habits -physical therapyIn my conversation with [name]  she  states that she is feeliing good ./         SDOH assessments and interventions completed:  No     Care Coordination Interventions:  Yes, provided   Interventions Today    Flowsheet Row Most Recent Value  Chronic Disease   Chronic disease during today's visit Congestive Heart Failure (CHF)  General Interventions   General Interventions Discussed/Reviewed General Interventions Discussed, General Interventions Reviewed  Education Interventions   Education Provided Provided Education  Nutrition Interventions   Nutrition Discussed/Reviewed Nutrition Discussed, Nutrition Reviewed, Adding fruits and vegetables, Carbohydrate meal planning  Pharmacy Interventions   Pharmacy Dicussed/Reviewed Pharmacy Topics Discussed, Pharmacy Topics Reviewed  Safety Interventions   Safety Discussed/Reviewed Safety Discussed        Follow up plan: Follow up call scheduled for 06/12/23  11 am    Encounter Outcome:  Patient Visit Completed   Juanell Fairly RN, BSN, [facility] [place]  [facility], [facility]  Care Coordinator Phone: [phone]

## 2023-05-12 NOTE — Patient Instructions (Signed)
 Visit Information  Thank you for taking time to visit with me today. Please don't hesitate to contact me if I can be of assistance to you.   Following are the goals we discussed today:   Goals Addressed             This Visit's Progress    I want to monitor and manage my CHF       Patient Goals/Self Care Activities: -Patient/Caregiver will take medications as prescribed   -Patient/Caregiver will attend all scheduled provider appointments -Patient/Caregiver will call pharmacy for medication refills 3-7 days in advance of running out of medications -Patient/Caregiver will call provider office for new concerns or questions  -Patient/Caregiver will focus on medication adherence by taking medications as prescribed   -Weigh daily and record (notify MD with 3 lb weight gain over night or 5 lb in a week) -Follow CHF Action Plan- do a check daily -continue to exercise 15 minutes /day split into 5 minute increments Wt Readings from Last 3 Encounters:  05/04/23 (!) 322 lb 12.8 oz (146.4 kg)  05/02/23 (!) 315 lb (142.9 kg)  04/18/23 (!) 324 lb (147 kg)  -Continue with your positive eating habits -physical therapyIn my conversation with Mrss. Rupert  she states that she is feeliing good ./         Our next appointment is by telephone on 06/12/23 at 11 am  Please call the care guide team at [phone] if you need to cancel or reschedule your appointment.   If you are experiencing a Mental Health or Behavioral Health Crisis or need someone to talk to, please call 1-800-273-TALK (toll free, 24 hour hotline)  Patient verbalizes understanding of instructions and care plan provided today and agrees to view in MyChart. Active MyChart status and patient understanding of how to access instructions and care plan via MyChart confirmed with patient.     Juanell Fairly RN, BSN, [facility]  [facility], [facility]  Care Coordinator Phone: [phone]

## 2023-05-13 ENCOUNTER — Other Ambulatory Visit: Payer: Self-pay | Admitting: Family Medicine

## 2023-05-14 ENCOUNTER — Other Ambulatory Visit (HOSPITAL_COMMUNITY): Payer: Self-pay

## 2023-05-15 ENCOUNTER — Other Ambulatory Visit: Payer: Self-pay

## 2023-05-15 ENCOUNTER — Ambulatory Visit: Admitting: Physical Therapy

## 2023-05-15 ENCOUNTER — Encounter: Payer: Self-pay | Admitting: Physical Therapy

## 2023-05-15 ENCOUNTER — Other Ambulatory Visit (HOSPITAL_COMMUNITY): Payer: Self-pay

## 2023-05-15 DIAGNOSIS — M25511 Pain in right shoulder: Secondary | ICD-10-CM

## 2023-05-15 DIAGNOSIS — M6281 Muscle weakness (generalized): Secondary | ICD-10-CM

## 2023-05-15 DIAGNOSIS — M542 Cervicalgia: Secondary | ICD-10-CM

## 2023-05-15 DIAGNOSIS — M5412 Radiculopathy, cervical region: Secondary | ICD-10-CM | POA: Diagnosis not present

## 2023-05-15 MED ORDER — FUROSEMIDE 80 MG PO TABS
80.0000 mg | ORAL_TABLET | Freq: Every day | ORAL | 1 refills | Status: DC
  Filled 2023-05-15: qty 90, 90d supply, fill #0
  Filled 2023-08-05: qty 90, 90d supply, fill #1

## 2023-05-15 NOTE — Therapy (Signed)
 OUTPATIENT PHYSICAL THERAPY CERVICAL TREATMENT   Patient Name: Stacey Page MRN: [id number] DOB:03-Feb-1951, 73 y.o., female Today's Date: 05/15/2023  END OF SESSION:  PT End of Session - 05/15/23 0931     Visit Number 5    Date for PT Re-Evaluation 06/15/23    PT Start Time 0930    PT Stop Time 1015    PT Time Calculation (min) 45 min    Activity Tolerance Patient tolerated treatment well    Behavior During Therapy [facility] for tasks assessed/performed              Past Medical History:  Diagnosis Date   Anxiety    doesn't take any meds   Arthritis of right knee 03/14/2016   Asthma    Back pain    Chronic diastolic CHF (congestive heart failure) (HCC)    HF with Preserved EF (60-65%) - Grade II Diastolic Dysfunction (Hypertensive Heart Disease). takes Furosemide daily   Depression    doesn't take meds   Diabetes (HCC)    takes Januvia daily   Eczema    uses cream as needed   GERD (gastroesophageal reflux disease)    History of bronchitis as a child    HTN (hypertension)    Insomnia    Moderate aortic stenosis by prior echocardiogram 04/2017   10/15/2021: Georgina Quint calcification with moderate AS, MG 23 mmHg   Mycobacterium avium-intracellulare infection (HCC) 2016   OA (osteoarthritis)    Obese    OSA (obstructive sleep apnea) 02/25/2014   wears CPAP at night   Peripheral neuropathy    takes Gabapentin as needed   Pneumonia    hx of-2010   Seasonal allergies    uses Flonase daily   Sleep apnea    Past Surgical History:  Procedure Laterality Date   BREAST BIOPSY Right 03/2018   BREAST LUMPECTOMY WITH RADIOACTIVE SEED LOCALIZATION Right 12/28/2018   Procedure: RIGHT BREAST LUMPECTOMY WITH RADIOACTIVE SEED LOCALIZATION;  Surgeon: Griselda Miner, MD;  Location: [facility] OR;  Service: General;  Laterality: Right;   BREAST SURGERY     CARDIOPULMONARY EXERCISE TEST (CPX)  06/21/2021   (Performed on Coreg 25 mg twice daily): PFTs: FVC 1.76 (60%), FEV1 1.59 (77%), ratio  114% => RESTRICTIVE PHYSIOLOGY; 5 min- 1 mph - 2% grade. 7/10 dyspnea -> pulse ox range during exercise 95 to 98%, low of 90%, 97% on recovery; BP 106/84-160/64; heart rate 64-90 bpm (60% MPHR) -> Chronotropic Incompetence;;MAIN FACTOR for Exercise Limitation = BODY HABITUS w/ Chronotropic Incompetence.   CESAREAN SECTION  x2   COLONOSCOPY     CORONARY CALCIUM SCORE & CT ANGIOGRAM  09/2017   Coronary Ca Score = 11 (low).  CTA- no obstructive CAD (minimal disease)   JOINT REPLACEMENT     KNEE ARTHROSCOPY Right    LUNG BIOPSY Right 06/10/2014   Procedure: LUNG BIOPSY;  Surgeon: Kerin Perna, MD;  Location: [facility] OR;  Service: Thoracic;  Laterality: Right;   POLYPECTOMY     throat   RIGHT HEART CATH N/A 10/15/2021   Procedure: RIGHT HEART CATH;  Surgeon: Lyn Records, MD;  Location: [facility] INVASIVE CV LAB;  Service: CV: Mild Pulmonary Hypertension (WHO Group 3).  Mean PAP 28 mmHg, PCWP 13 mmHg.  PVR 2.73 [place].  RAP 6 mmHg.CO-CI 5.5L/min, 2.17 L/min/m.   TOTAL KNEE ARTHROPLASTY Right 03/14/2016   Procedure: RIGHT TOTAL KNEE ARTHROPLASTY;  Surgeon: Eldred Manges, MD;  Location: [facility] OR;  Service: Orthopedics;  Laterality: Right;   TRANSTHORACIC  ECHOCARDIOGRAM  10/10/2021   EF 70 to 75%.  Hyperdynamic with no RWMA.  Moderate LVH.  Moderate LA dilation indicating likely diastolic dysfunction although not interpretable.  Moderately elevated PAP with normal RV function, dilated IVC with elevated RAP estimated at 15 mmHg..  Moderate MAC with no MR.  AOV calcification with Moderate AS-mean AvG 23 mmHg.   TRANSTHORACIC ECHOCARDIOGRAM  06/14/2021   EF 60 to 65%.  No RWMA.  Moderate cLVH with GR 1 DD.  Mild LA dilation.  Mild to moderate aortic calcification/stenosis.  Mean AVG 18 mmHg.   VIDEO ASSISTED THORACOSCOPY Right 06/10/2014   Procedure: VIDEO ASSISTED THORACOSCOPY;  Surgeon: Kerin Perna, MD;  Location: [facility] OR;  Service: Thoracic;  Laterality: Right;   Patient Active Problem List   Diagnosis Date  Noted   Subacromial bursitis of right shoulder joint 04/18/2023   Asthma 03/03/2023   Urinary frequency 02/03/2023   Viral upper respiratory tract infection 12/20/2022   Dysthymia 02/08/2022   Hypokalemia 01/26/2022   Mild pulmonary hypertension (HCC) 01/09/2022   Need for influenza vaccination 12/02/2021   Dysfunction of left eustachian tube 12/02/2021   Allergic rhinitis 12/02/2021   Chronic respiratory failure with hypoxia (HCC) 11/11/2021   Demand ischemia (HCC)    Respiratory acidosis    Acute respiratory failure with hypercapnia (HCC)    Acute on chronic heart failure with preserved ejection fraction (HCC)    CHF (congestive heart failure) (HCC) 10/09/2021   Reactive airway disease 09/21/2021   Viral syndrome 09/21/2021   Hypertensive retinopathy of right eye, grade 1 08/11/2021   Grade 2 hypertensive retinopathy, left 08/11/2021   Nuclear sclerotic cataract of both eyes 08/11/2021   Need for shingles vaccine 07/14/2021   Iron deficiency anemia 01/13/2021   Stage 4 chronic kidney disease (HCC) 07/09/2019   Type 2 diabetes mellitus with obesity (HCC) 07/02/2019   Polyneuropathy associated with underlying disease (HCC) 11/28/2018   Severe episode of recurrent major depressive disorder, without psychotic features (HCC) 11/28/2018   Type 2 diabetes mellitus with diabetic peripheral angiopathy without gangrene, without long-term current use of insulin (HCC) 11/28/2018   Poor short term memory 11/28/2018   Moderate aortic stenosis by prior echocardiogram 11/06/2018   Diabetes mellitus (HCC) 03/21/2017   Acute renal failure superimposed on stage 2 chronic kidney disease (HCC) 03/17/2016   Symptomatic anemia 03/17/2016   S/P total knee arthroplasty, right 03/17/2016   Unilateral primary osteoarthritis, right knee    Hyperlipidemia associated with type 2 diabetes mellitus (HCC) 12/15/2015   Pain in both lower extremities 11/19/2015   Numbness in both hands 08/13/2015   Panic  attack 06/09/2015   Right knee pain 05/14/2015   Chronic renal insufficiency 09/16/2014   Pneumothorax on right    Atypical mycobacterium infection    ILD (interstitial lung disease) 2/2 MAI s/p med rxn    Class 3 severe obesity with serious comorbidity and body mass index (BMI) of 50.0 to 59.9 in adult (HCC) 04/03/2014   OSA (obstructive sleep apnea) 02/25/2014   Hypertensive heart disease with chronic diastolic congestive heart failure (HCC) 02/25/2014   DOE (dyspnea on exertion) 02/25/2014   Essential hypertension 02/25/2014    PCP: Nadene Rubins  REFERRING PROVIDER: Reino Bellis  REFERRING DIAG:  [phone] (ICD-10-CM) - Cervical radiculopathy    THERAPY DIAG:  Cervicalgia  Radiculopathy, cervical region  Acute pain of right shoulder  Muscle weakness (generalized)  Rationale for Evaluation and Treatment: Rehabilitation  ONSET DATE: "a couple of weeks"  SUBJECTIVE:  SUBJECTIVE STATEMENT:  "Great"   Hand dominance: Right  PERTINENT HISTORY:  Patient is a very pleasant right-hand-dominant 73 year old female that presents today with a couple of weeks of right arm pain.  Her pain is most noticeable when she holds the arm in a dependent position by her side.  Pain is alleviated when raising the arm directly overhead.  She describes it as a tingling type of sensation that localizes itself to the upper arm.  No pain past the elbow.  She denies any weakness in the arm.  No similar issues in the past. Patient will start physical therapy.  She takes 100 mg of gabapentin twice daily for neuropathy and I recommended that she increase that to 100 mg 3 times daily.  She will discuss that with her PCP.  If symptoms persist or worsen then consider imaging at that time including x-ray and  MRI.  Follow-up for ongoing or recalcitrant issues.   PAIN:  Are you having pain? Yes: NPRS scale: 0/10 Pain location: neck going down to the wrist  Pain description: sharp then goes numb  Aggravating factors: laying down, household chores, getting dressed  Relieving factors: PT   PRECAUTIONS: None  RED FLAGS: None    WEIGHT BEARING RESTRICTIONS: No  FALLS:  Has patient fallen in last 6 months? Yes. Number of falls I had 1, I passed out this Saturday after I was sick and vomiting   LIVING ENVIRONMENT: Lives with: lives with their family Lives in: House/apartment Stairs: No Has following equipment at home: Environmental consultant - 2 wheeled  OCCUPATION: retired   PLOF: Independent  PATIENT GOALS: I want to release this as soon as I can   NEXT MD VISIT: have not scheduled yet  OBJECTIVE:  Note: Objective measures were completed at Evaluation unless otherwise noted.  DIAGNOSTIC FINDINGS:  N/A  COGNITION: Overall cognitive status: Within functional limits for tasks assessed  SENSATION: WFL  POSTURE: rounded shoulders and forward head  PALPATION: TTP and trigger points noted in R UT    CERVICAL ROM:   Active ROM A/PROM (deg) eval AROM 05/15/23  Flexion WNL WNL  Extension WNL pain at end range but nothing severe WNL no pain just a pull  Right lateral flexion 75% Limited 25%  Left lateral flexion 75% pain in R UT Limited 25%  Right rotation WNL WNL  Left rotation WNL WNL   (Blank rows = not tested)  UPPER EXTREMITY ROM: good overall ROM within functional limits    UPPER EXTREMITY MMT: grossly 4+/5 BUE, shoulder abd 3+ BUE with pain    CERVICAL SPECIAL TESTS:  Upper limb tension test (ULTT): Negative, Spurling's test: Positive, and Distraction test: Positive   TREATMENT DATE:  05/15/23 GOALS UBE L2 x3 min forward/3 min backward  Seated Rows & Lats 20lb 2x10 Chest press 5lb 2x10 Chin tucks 2x10 Shoulder flexion 2# 2x10  Shoulder abd 2# 2x10  Shoulder ER red  2x10  05/09/23  UBE L2 x3 min forward/3 min backward (one minute rest after changing directions)  Chin tucks x15  Chin tuck + rotation x10 B Chin tuck + lateral flexion x10 B Scap retractions x12  Backward shoulder rolls x15  Shoulder flexion 2# 2x10  Shoulder abd 2# 2x10   05/03/23 NuStep L5 x 6 min Sit to stand OHP red ball 2x8 Cervical retractions 2x10 Shoulder ER yellow 2x10 Shoulder Flex 2lb 2x10 Shoulder abd 2lb  2x10   04/26/23 AAROM Shoulder Flex, Ext, IR ups back x10 NuStep L 5 x  6 min Rows & Ext red 2x10 Shoulder ER yellow 2x10    04/20/23- EVAL                                                                                                                                 PATIENT EDUCATION:  Education details: POC and HEP Person educated: Patient Education method: Explanation Education comprehension: verbalized understanding  HOME EXERCISE PROGRAM: Access Code: [email or web address] URL: [email or web address] Date: 04/20/2023 Prepared by: Cassie Freer  Exercises - Seated Upper Trapezius Stretch  - 1 x daily - 7 x weekly - 2 sets - 3 reps - 15 hold - Seated Levator Scapulae Stretch  - 1 x daily - 7 x weekly - 2 sets - 3 reps - 15 hold - Seated Cervical Retraction  - 1 x daily - 7 x weekly - 2 sets - 10 reps - Doorway Pec Stretch at 90 Degrees Abduction  - 1 x daily - 7 x weekly - 2 sets - 3 reps - 15 hold  ASSESSMENT:  CLINICAL IMPRESSION:  Pt arrives today feeling great with less pain overall. She has progressed increasing her cervical AROM in all directions.  We continued working on postural training and strengthening today as tolerated. Some pain reported with ER. Tactiel cues needed to prevent cervical extension with chin tucks. Very deconditioned and easily fatigued. Will continue to progress and assess.    OBJECTIVE IMPAIRMENTS: decreased ROM, decreased strength, impaired flexibility, postural dysfunction, and pain.   ACTIVITY LIMITATIONS:  lifting, sleeping, and dressing  PARTICIPATION LIMITATIONS: cleaning, laundry, and driving  PERSONAL FACTORS: Age, Fitness, Past/current experiences, Time since onset of injury/illness/exacerbation, and 3+ comorbidities: CHF, DM, respiratory issues, HTN  are also affecting patient's functional outcome.   REHAB POTENTIAL: Good  CLINICAL DECISION MAKING: Stable/uncomplicated  EVALUATION COMPLEXITY: Low  GOALS: Goals reviewed with patient? Yes  SHORT TERM GOALS: Target date: 05/18/23  Patient will be independent with initial HEP.  Baseline: HEP given 04/20/23 Goal status: Met 05/15/23   LONG TERM GOALS: Target date:06/15/23  Patient will be independent with advanced/ongoing HEP to improve outcomes and carryover.  Baseline:  Goal status: INITIAL  2.  Patient will report 75% improvement in neck and upper arm pain to improve QOL.  Baseline: 10/10 esp at night Goal status: Progressing 50% better 05/15/23   3.  Patient will demonstrate full pain free cervical ROM  Baseline: see chart Goal status: Progressing 05/15/23  4.  Patient will able to sleep throughout the night without increase is pain and waking up due to arm pain Baseline: unable to sleep at night Goal status: Met 05/15/23  PLAN:  PT FREQUENCY: 2x/week  PT DURATION: 8 weeks  PLANNED INTERVENTIONS: 97110-Therapeutic exercises, 97530- Therapeutic activity, 97112- Neuromuscular re-education, 97535- Self Care, 91478- Manual therapy, Patient/Family education, Taping, Dry Needling, Joint mobilization, Spinal mobilization, Cryotherapy, and Moist heat  PLAN FOR NEXT SESSION: manual therapy for neck, stretching for neck, DN if she wants  to try it, progress postural training   Debroah Baller, PTA  05/15/23 9:32 AM

## 2023-05-16 ENCOUNTER — Ambulatory Visit: Admitting: Physical Therapy

## 2023-05-16 ENCOUNTER — Encounter: Payer: Self-pay | Admitting: Physical Therapy

## 2023-05-16 DIAGNOSIS — M25511 Pain in right shoulder: Secondary | ICD-10-CM

## 2023-05-16 DIAGNOSIS — M6281 Muscle weakness (generalized): Secondary | ICD-10-CM

## 2023-05-16 DIAGNOSIS — M5412 Radiculopathy, cervical region: Secondary | ICD-10-CM

## 2023-05-16 DIAGNOSIS — M542 Cervicalgia: Secondary | ICD-10-CM

## 2023-05-16 NOTE — Therapy (Signed)
 OUTPATIENT PHYSICAL THERAPY CERVICAL TREATMENT   Patient Name: Stacey Page MRN: [id number] DOB:23-Apr-1950, 73 y.o., female Today's Date: 05/16/2023  END OF SESSION:  PT End of Session - 05/16/23 0840     Visit Number 6    Date for PT Re-Evaluation 06/15/23    PT Start Time 0840    PT Stop Time 0925    PT Time Calculation (min) 45 min    Activity Tolerance Patient tolerated treatment well    Behavior During Therapy [facility] for tasks assessed/performed              Past Medical History:  Diagnosis Date   Anxiety    doesn't take any meds   Arthritis of right knee 03/14/2016   Asthma    Back pain    Chronic diastolic CHF (congestive heart failure) (HCC)    HF with Preserved EF (60-65%) - Grade II Diastolic Dysfunction (Hypertensive Heart Disease). takes Furosemide daily   Depression    doesn't take meds   Diabetes (HCC)    takes Januvia daily   Eczema    uses cream as needed   GERD (gastroesophageal reflux disease)    History of bronchitis as a child    HTN (hypertension)    Insomnia    Moderate aortic stenosis by prior echocardiogram 04/2017   10/15/2021: Georgina Quint calcification with moderate AS, MG 23 mmHg   Mycobacterium avium-intracellulare infection (HCC) 2016   OA (osteoarthritis)    Obese    OSA (obstructive sleep apnea) 02/25/2014   wears CPAP at night   Peripheral neuropathy    takes Gabapentin as needed   Pneumonia    hx of-2010   Seasonal allergies    uses Flonase daily   Sleep apnea    Past Surgical History:  Procedure Laterality Date   BREAST BIOPSY Right 03/2018   BREAST LUMPECTOMY WITH RADIOACTIVE SEED LOCALIZATION Right 12/28/2018   Procedure: RIGHT BREAST LUMPECTOMY WITH RADIOACTIVE SEED LOCALIZATION;  Surgeon: Griselda Miner, MD;  Location: [facility] OR;  Service: General;  Laterality: Right;   BREAST SURGERY     CARDIOPULMONARY EXERCISE TEST (CPX)  06/21/2021   (Performed on Coreg 25 mg twice daily): PFTs: FVC 1.76 (60%), FEV1 1.59 (77%), ratio  114% => RESTRICTIVE PHYSIOLOGY; 5 min- 1 mph - 2% grade. 7/10 dyspnea -> pulse ox range during exercise 95 to 98%, low of 90%, 97% on recovery; BP 106/84-160/64; heart rate 64-90 bpm (60% MPHR) -> Chronotropic Incompetence;;MAIN FACTOR for Exercise Limitation = BODY HABITUS w/ Chronotropic Incompetence.   CESAREAN SECTION  x2   COLONOSCOPY     CORONARY CALCIUM SCORE & CT ANGIOGRAM  09/2017   Coronary Ca Score = 11 (low).  CTA- no obstructive CAD (minimal disease)   JOINT REPLACEMENT     KNEE ARTHROSCOPY Right    LUNG BIOPSY Right 06/10/2014   Procedure: LUNG BIOPSY;  Surgeon: Kerin Perna, MD;  Location: [facility] OR;  Service: Thoracic;  Laterality: Right;   POLYPECTOMY     throat   RIGHT HEART CATH N/A 10/15/2021   Procedure: RIGHT HEART CATH;  Surgeon: Lyn Records, MD;  Location: [facility] INVASIVE CV LAB;  Service: CV: Mild Pulmonary Hypertension (WHO Group 3).  Mean PAP 28 mmHg, PCWP 13 mmHg.  PVR 2.73 [place].  RAP 6 mmHg.CO-CI 5.5L/min, 2.17 L/min/m.   TOTAL KNEE ARTHROPLASTY Right 03/14/2016   Procedure: RIGHT TOTAL KNEE ARTHROPLASTY;  Surgeon: Eldred Manges, MD;  Location: [facility] OR;  Service: Orthopedics;  Laterality: Right;   TRANSTHORACIC  ECHOCARDIOGRAM  10/10/2021   EF 70 to 75%.  Hyperdynamic with no RWMA.  Moderate LVH.  Moderate LA dilation indicating likely diastolic dysfunction although not interpretable.  Moderately elevated PAP with normal RV function, dilated IVC with elevated RAP estimated at 15 mmHg..  Moderate MAC with no MR.  AOV calcification with Moderate AS-mean AvG 23 mmHg.   TRANSTHORACIC ECHOCARDIOGRAM  06/14/2021   EF 60 to 65%.  No RWMA.  Moderate cLVH with GR 1 DD.  Mild LA dilation.  Mild to moderate aortic calcification/stenosis.  Mean AVG 18 mmHg.   VIDEO ASSISTED THORACOSCOPY Right 06/10/2014   Procedure: VIDEO ASSISTED THORACOSCOPY;  Surgeon: Kerin Perna, MD;  Location: [facility] OR;  Service: Thoracic;  Laterality: Right;   Patient Active Problem List   Diagnosis Date  Noted   Subacromial bursitis of right shoulder joint 04/18/2023   Asthma 03/03/2023   Urinary frequency 02/03/2023   Viral upper respiratory tract infection 12/20/2022   Dysthymia 02/08/2022   Hypokalemia 01/26/2022   Mild pulmonary hypertension (HCC) 01/09/2022   Need for influenza vaccination 12/02/2021   Dysfunction of left eustachian tube 12/02/2021   Allergic rhinitis 12/02/2021   Chronic respiratory failure with hypoxia (HCC) 11/11/2021   Demand ischemia (HCC)    Respiratory acidosis    Acute respiratory failure with hypercapnia (HCC)    Acute on chronic heart failure with preserved ejection fraction (HCC)    CHF (congestive heart failure) (HCC) 10/09/2021   Reactive airway disease 09/21/2021   Viral syndrome 09/21/2021   Hypertensive retinopathy of right eye, grade 1 08/11/2021   Grade 2 hypertensive retinopathy, left 08/11/2021   Nuclear sclerotic cataract of both eyes 08/11/2021   Need for shingles vaccine 07/14/2021   Iron deficiency anemia 01/13/2021   Stage 4 chronic kidney disease (HCC) 07/09/2019   Type 2 diabetes mellitus with obesity (HCC) 07/02/2019   Polyneuropathy associated with underlying disease (HCC) 11/28/2018   Severe episode of recurrent major depressive disorder, without psychotic features (HCC) 11/28/2018   Type 2 diabetes mellitus with diabetic peripheral angiopathy without gangrene, without long-term current use of insulin (HCC) 11/28/2018   Poor short term memory 11/28/2018   Moderate aortic stenosis by prior echocardiogram 11/06/2018   Diabetes mellitus (HCC) 03/21/2017   Acute renal failure superimposed on stage 2 chronic kidney disease (HCC) 03/17/2016   Symptomatic anemia 03/17/2016   S/P total knee arthroplasty, right 03/17/2016   Unilateral primary osteoarthritis, right knee    Hyperlipidemia associated with type 2 diabetes mellitus (HCC) 12/15/2015   Pain in both lower extremities 11/19/2015   Numbness in both hands 08/13/2015   Panic  attack 06/09/2015   Right knee pain 05/14/2015   Chronic renal insufficiency 09/16/2014   Pneumothorax on right    Atypical mycobacterium infection    ILD (interstitial lung disease) 2/2 MAI s/p med rxn    Class 3 severe obesity with serious comorbidity and body mass index (BMI) of 50.0 to 59.9 in adult (HCC) 04/03/2014   OSA (obstructive sleep apnea) 02/25/2014   Hypertensive heart disease with chronic diastolic congestive heart failure (HCC) 02/25/2014   DOE (dyspnea on exertion) 02/25/2014   Essential hypertension 02/25/2014    PCP: Nadene Rubins  REFERRING PROVIDER: Reino Bellis  REFERRING DIAG:  [phone] (ICD-10-CM) - Cervical radiculopathy    THERAPY DIAG:  Cervicalgia  Acute pain of right shoulder  Muscle weakness (generalized)  Radiculopathy, cervical region  Rationale for Evaluation and Treatment: Rehabilitation  ONSET DATE: "a couple of weeks"  SUBJECTIVE:  SUBJECTIVE STATEMENT:  "Great, but my knee is killing me"   Hand dominance: Right  PERTINENT HISTORY:  Patient is a very pleasant right-hand-dominant 73 year old female that presents today with a couple of weeks of right arm pain.  Her pain is most noticeable when she holds the arm in a dependent position by her side.  Pain is alleviated when raising the arm directly overhead.  She describes it as a tingling type of sensation that localizes itself to the upper arm.  No pain past the elbow.  She denies any weakness in the arm.  No similar issues in the past. Patient will start physical therapy.  She takes 100 mg of gabapentin twice daily for neuropathy and I recommended that she increase that to 100 mg 3 times daily.  She will discuss that with her PCP.  If symptoms persist or worsen then consider imaging at that  time including x-ray and MRI.  Follow-up for ongoing or recalcitrant issues.   PAIN:  Are you having pain? Yes: NPRS scale: 6/10 Pain location: L knee Pain description: sharp then goes numb  Aggravating factors: laying down, household chores, getting dressed  Relieving factors: PT   PRECAUTIONS: None  RED FLAGS: None    WEIGHT BEARING RESTRICTIONS: No  FALLS:  Has patient fallen in last 6 months? Yes. Number of falls I had 1, I passed out this Saturday after I was sick and vomiting   LIVING ENVIRONMENT: Lives with: lives with their family Lives in: House/apartment Stairs: No Has following equipment at home: Environmental consultant - 2 wheeled  OCCUPATION: retired   PLOF: Independent  PATIENT GOALS: I want to release this as soon as I can   NEXT MD VISIT: have not scheduled yet  OBJECTIVE:  Note: Objective measures were completed at Evaluation unless otherwise noted.  DIAGNOSTIC FINDINGS:  N/A  COGNITION: Overall cognitive status: Within functional limits for tasks assessed  SENSATION: WFL  POSTURE: rounded shoulders and forward head  PALPATION: TTP and trigger points noted in R UT    CERVICAL ROM:   Active ROM A/PROM (deg) eval AROM 05/15/23  Flexion WNL WNL  Extension WNL pain at end range but nothing severe WNL no pain just a pull  Right lateral flexion 75% Limited 25%  Left lateral flexion 75% pain in R UT Limited 25%  Right rotation WNL WNL  Left rotation WNL WNL   (Blank rows = not tested)  UPPER EXTREMITY ROM: good overall ROM within functional limits    UPPER EXTREMITY MMT: grossly 4+/5 BUE, shoulder abd 3+ BUE with pain    CERVICAL SPECIAL TESTS:  Upper limb tension test (ULTT): Negative, Spurling's test: Positive, and Distraction test: Positive   TREATMENT DATE:  05/16/22 NuStep L 5 x 6 min Horiz abd green 2x10 Shoulder Ext 5lb 2x10 Shoulder ER red 2x10 Chin tucks 2x10 Shoulder abd 2# 2x10  Backward shoulder rolls x15 . 20lb triceps  Ext  05/15/23 GOALS UBE L2 x3 min forward/3 min backward  Seated Rows & Lats 20lb 2x10 Chest press 5lb 2x10 Chin tucks 2x10 Shoulder flexion 2# 2x10  Shoulder abd 2# 2x10  Shoulder ER red 2x10  05/09/23  UBE L2 x3 min forward/3 min backward (one minute rest after changing directions)  Chin tucks x15  Chin tuck + rotation x10 B Chin tuck + lateral flexion x10 B Scap retractions x12  Backward shoulder rolls x15  Shoulder flexion 2# 2x10  Shoulder abd 2# 2x10   05/03/23 NuStep L5 x 6 min Sit  to stand OHP red ball 2x8 Cervical retractions 2x10 Shoulder ER yellow 2x10 Shoulder Flex 2lb 2x10 Shoulder abd 2lb  2x10   04/26/23 AAROM Shoulder Flex, Ext, IR ups back x10 NuStep L 5 x 6 min Rows & Ext red 2x10 Shoulder ER yellow 2x10    04/20/23- EVAL                                                                                                                                 PATIENT EDUCATION:  Education details: POC and HEP Person educated: Patient Education method: Explanation Education comprehension: verbalized understanding  HOME EXERCISE PROGRAM: Access Code: [email or web address] URL: [email or web address] Date: 04/20/2023 Prepared by: Cassie Freer  Exercises - Seated Upper Trapezius Stretch  - 1 x daily - 7 x weekly - 2 sets - 3 reps - 15 hold - Seated Levator Scapulae Stretch  - 1 x daily - 7 x weekly - 2 sets - 3 reps - 15 hold - Seated Cervical Retraction  - 1 x daily - 7 x weekly - 2 sets - 10 reps - Doorway Pec Stretch at 90 Degrees Abduction  - 1 x daily - 7 x weekly - 2 sets - 3 reps - 15 hold  ASSESSMENT:  CLINICAL IMPRESSION:  Pt arrives today feeling great with some L knee pain that started last night. Continued working on postural training and strengthening today as tolerated. PT did reports some soreness with reverse shrugs. Tactiel cues needed to prevent cervical extension with chin tucks. She is very deconditioned and easily fatigued. Will  continue to progress and assess.    OBJECTIVE IMPAIRMENTS: decreased ROM, decreased strength, impaired flexibility, postural dysfunction, and pain.   ACTIVITY LIMITATIONS: lifting, sleeping, and dressing  PARTICIPATION LIMITATIONS: cleaning, laundry, and driving  PERSONAL FACTORS: Age, Fitness, Past/current experiences, Time since onset of injury/illness/exacerbation, and 3+ comorbidities: CHF, DM, respiratory issues, HTN  are also affecting patient's functional outcome.   REHAB POTENTIAL: Good  CLINICAL DECISION MAKING: Stable/uncomplicated  EVALUATION COMPLEXITY: Low  GOALS: Goals reviewed with patient? Yes  SHORT TERM GOALS: Target date: 05/18/23  Patient will be independent with initial HEP.  Baseline: HEP given 04/20/23 Goal status: Met 05/15/23   LONG TERM GOALS: Target date:06/15/23  Patient will be independent with advanced/ongoing HEP to improve outcomes and carryover.  Baseline:  Goal status: INITIAL  2.  Patient will report 75% improvement in neck and upper arm pain to improve QOL.  Baseline: 10/10 esp at night Goal status: Progressing 50% better 05/15/23   3.  Patient will demonstrate full pain free cervical ROM  Baseline: see chart Goal status: Progressing 05/15/23  4.  Patient will able to sleep throughout the night without increase is pain and waking up due to arm pain Baseline: unable to sleep at night Goal status: Met 05/15/23  PLAN:  PT FREQUENCY: 2x/week  PT DURATION: 8 weeks  PLANNED INTERVENTIONS: 97110-Therapeutic exercises, 97530- Therapeutic activity, 97112- Neuromuscular re-education,  [id number]- Self Care, [id number]- Manual therapy, Patient/Family education, Taping, Dry Needling, Joint mobilization, Spinal mobilization, Cryotherapy, and Moist heat  PLAN FOR NEXT SESSION: manual therapy for neck, stretching for neck, DN if she wants to try it, progress postural training   Debroah Baller, PTA  05/16/23 8:40 AM

## 2023-05-18 ENCOUNTER — Ambulatory Visit: Admitting: Physical Therapy

## 2023-05-18 ENCOUNTER — Encounter: Payer: Self-pay | Admitting: Physical Therapy

## 2023-05-18 DIAGNOSIS — M5412 Radiculopathy, cervical region: Secondary | ICD-10-CM | POA: Diagnosis not present

## 2023-05-18 DIAGNOSIS — M542 Cervicalgia: Secondary | ICD-10-CM

## 2023-05-18 DIAGNOSIS — M6281 Muscle weakness (generalized): Secondary | ICD-10-CM

## 2023-05-18 DIAGNOSIS — M25511 Pain in right shoulder: Secondary | ICD-10-CM

## 2023-05-18 NOTE — Therapy (Signed)
 OUTPATIENT PHYSICAL THERAPY CERVICAL TREATMENT   Patient Name: Stacey Page MRN: [id number] DOB:[birth year], 73 y.o., female Today's Date: 05/18/2023  END OF SESSION:  PT End of Session - 05/18/23 0844     Visit Number 7    Date for PT Re-Evaluation 06/15/23    PT Start Time 0844    PT Stop Time 0929    PT Time Calculation (min) 45 min    Activity Tolerance Patient tolerated treatment well    Behavior During Therapy [facility] for tasks assessed/performed              Past Medical History:  Diagnosis Date   Anxiety    doesn't take any meds   Arthritis of right knee 03/14/2016   Asthma    Back pain    Chronic diastolic CHF (congestive heart failure) (HCC)    HF with Preserved EF (60-65%) - Grade II Diastolic Dysfunction (Hypertensive Heart Disease). takes Furosemide daily   Depression    doesn't take meds   Diabetes (HCC)    takes Januvia daily   Eczema    uses cream as needed   GERD (gastroesophageal reflux disease)    History of bronchitis as a child    HTN (hypertension)    Insomnia    Moderate aortic stenosis by prior echocardiogram 04/2017   10/15/2021: Georgina Quint calcification with moderate AS, MG 23 mmHg   Mycobacterium avium-intracellulare infection (HCC) 2016   OA (osteoarthritis)    Obese    OSA (obstructive sleep apnea) 02/25/2014   wears CPAP at night   Peripheral neuropathy    takes Gabapentin as needed   Pneumonia    hx of-2010   Seasonal allergies    uses Flonase daily   Sleep apnea    Past Surgical History:  Procedure Laterality Date   BREAST BIOPSY Right 03/2018   BREAST LUMPECTOMY WITH RADIOACTIVE SEED LOCALIZATION Right 12/28/2018   Procedure: RIGHT BREAST LUMPECTOMY WITH RADIOACTIVE SEED LOCALIZATION;  Surgeon: Griselda Miner, MD;  Location: [facility] OR;  Service: General;  Laterality: Right;   BREAST SURGERY     CARDIOPULMONARY EXERCISE TEST (CPX)  06/21/2021   (Performed on Coreg 25 mg twice daily): PFTs: FVC 1.76 (60%), FEV1 1.59 (77%), ratio  114% => RESTRICTIVE PHYSIOLOGY; 5 min- 1 mph - 2% grade. 7/10 dyspnea -> pulse ox range during exercise 95 to 98%, low of 90%, 97% on recovery; BP 106/84-160/64; heart rate 64-90 bpm (60% MPHR) -> Chronotropic Incompetence;;MAIN FACTOR for Exercise Limitation = BODY HABITUS w/ Chronotropic Incompetence.   CESAREAN SECTION  x2   COLONOSCOPY     CORONARY CALCIUM SCORE & CT ANGIOGRAM  09/2017   Coronary Ca Score = 11 (low).  CTA- no obstructive CAD (minimal disease)   JOINT REPLACEMENT     KNEE ARTHROSCOPY Right    LUNG BIOPSY Right 06/10/2014   Procedure: LUNG BIOPSY;  Surgeon: Kerin Perna, MD;  Location: [facility] OR;  Service: Thoracic;  Laterality: Right;   POLYPECTOMY     throat   RIGHT HEART CATH N/A 10/15/2021   Procedure: RIGHT HEART CATH;  Surgeon: Lyn Records, MD;  Location: [facility] INVASIVE CV LAB;  Service: CV: Mild Pulmonary Hypertension (WHO Group 3).  Mean PAP 28 mmHg, PCWP 13 mmHg.  PVR 2.73 [place].  RAP 6 mmHg.CO-CI 5.5L/min, 2.17 L/min/m.   TOTAL KNEE ARTHROPLASTY Right 03/14/2016   Procedure: RIGHT TOTAL KNEE ARTHROPLASTY;  Surgeon: Eldred Manges, MD;  Location: [facility] OR;  Service: Orthopedics;  Laterality: Right;   TRANSTHORACIC  ECHOCARDIOGRAM  10/10/2021   EF 70 to 75%.  Hyperdynamic with no RWMA.  Moderate LVH.  Moderate LA dilation indicating likely diastolic dysfunction although not interpretable.  Moderately elevated PAP with normal RV function, dilated IVC with elevated RAP estimated at 15 mmHg..  Moderate MAC with no MR.  AOV calcification with Moderate AS-mean AvG 23 mmHg.   TRANSTHORACIC ECHOCARDIOGRAM  06/14/2021   EF 60 to 65%.  No RWMA.  Moderate cLVH with GR 1 DD.  Mild LA dilation.  Mild to moderate aortic calcification/stenosis.  Mean AVG 18 mmHg.   VIDEO ASSISTED THORACOSCOPY Right 06/10/2014   Procedure: VIDEO ASSISTED THORACOSCOPY;  Surgeon: Kerin Perna, MD;  Location: [facility] OR;  Service: Thoracic;  Laterality: Right;   Patient Active Problem List   Diagnosis Date  Noted   Subacromial bursitis of right shoulder joint 04/18/2023   Asthma 03/03/2023   Urinary frequency 02/03/2023   Viral upper respiratory tract infection 12/20/2022   Dysthymia 02/08/2022   Hypokalemia 01/26/2022   Mild pulmonary hypertension (HCC) 01/09/2022   Need for influenza vaccination 12/02/2021   Dysfunction of left eustachian tube 12/02/2021   Allergic rhinitis 12/02/2021   Chronic respiratory failure with hypoxia (HCC) 11/11/2021   Demand ischemia (HCC)    Respiratory acidosis    Acute respiratory failure with hypercapnia (HCC)    Acute on chronic heart failure with preserved ejection fraction (HCC)    CHF (congestive heart failure) (HCC) 10/09/2021   Reactive airway disease 09/21/2021   Viral syndrome 09/21/2021   Hypertensive retinopathy of right eye, grade 1 08/11/2021   Grade 2 hypertensive retinopathy, left 08/11/2021   Nuclear sclerotic cataract of both eyes 08/11/2021   Need for shingles vaccine 07/14/2021   Iron deficiency anemia 01/13/2021   Stage 4 chronic kidney disease (HCC) 07/09/2019   Type 2 diabetes mellitus with obesity (HCC) 07/02/2019   Polyneuropathy associated with underlying disease (HCC) 11/28/2018   Severe episode of recurrent major depressive disorder, without psychotic features (HCC) 11/28/2018   Type 2 diabetes mellitus with diabetic peripheral angiopathy without gangrene, without long-term current use of insulin (HCC) 11/28/2018   Poor short term memory 11/28/2018   Moderate aortic stenosis by prior echocardiogram 11/06/2018   Diabetes mellitus (HCC) 03/21/2017   Acute renal failure superimposed on stage 2 chronic kidney disease (HCC) 03/17/2016   Symptomatic anemia 03/17/2016   S/P total knee arthroplasty, right 03/17/2016   Unilateral primary osteoarthritis, right knee    Hyperlipidemia associated with type 2 diabetes mellitus (HCC) 12/15/2015   Pain in both lower extremities 11/19/2015   Numbness in both hands 08/13/2015   Panic  attack 06/09/2015   Right knee pain 05/14/2015   Chronic renal insufficiency 09/16/2014   Pneumothorax on right    Atypical mycobacterium infection    ILD (interstitial lung disease) 2/2 MAI s/p med rxn    Class 3 severe obesity with serious comorbidity and body mass index (BMI) of 50.0 to 59.9 in adult (HCC) 04/03/2014   OSA (obstructive sleep apnea) 02/25/2014   Hypertensive heart disease with chronic diastolic congestive heart failure (HCC) 02/25/2014   DOE (dyspnea on exertion) 02/25/2014   Essential hypertension 02/25/2014    PCP: Nadene Rubins  REFERRING PROVIDER: Reino Bellis  REFERRING DIAG:  [phone] (ICD-10-CM) - Cervical radiculopathy    THERAPY DIAG:  Cervicalgia  Acute pain of right shoulder  Muscle weakness (generalized)  Radiculopathy, cervical region  Rationale for Evaluation and Treatment: Rehabilitation  ONSET DATE: "a couple of weeks"  SUBJECTIVE:  SUBJECTIVE STATEMENT:  "I feel freat"   Hand dominance: Right  PERTINENT HISTORY:  Patient is a very pleasant right-hand-dominant 73 year old female that presents today with a couple of weeks of right arm pain.  Her pain is most noticeable when she holds the arm in a dependent position by her side.  Pain is alleviated when raising the arm directly overhead.  She describes it as a tingling type of sensation that localizes itself to the upper arm.  No pain past the elbow.  She denies any weakness in the arm.  No similar issues in the past. Patient will start physical therapy.  She takes 100 mg of gabapentin twice daily for neuropathy and I recommended that she increase that to 100 mg 3 times daily.  She will discuss that with her PCP.  If symptoms persist or worsen then consider imaging at that time including x-ray  and MRI.  Follow-up for ongoing or recalcitrant issues.   PAIN:  Are you having pain? Yes: NPRS scale: 5 or 6/10 Pain location: L knee Pain description: sharp then goes numb  Aggravating factors: laying down, household chores, getting dressed  Relieving factors: PT   PRECAUTIONS: None  RED FLAGS: None    WEIGHT BEARING RESTRICTIONS: No  FALLS:  Has patient fallen in last 6 months? Yes. Number of falls I had 1, I passed out this Saturday after I was sick and vomiting   LIVING ENVIRONMENT: Lives with: lives with their family Lives in: House/apartment Stairs: No Has following equipment at home: Environmental consultant - 2 wheeled  OCCUPATION: retired   PLOF: Independent  PATIENT GOALS: I want to release this as soon as I can   NEXT MD VISIT: have not scheduled yet  OBJECTIVE:  Note: Objective measures were completed at Evaluation unless otherwise noted.  DIAGNOSTIC FINDINGS:  N/A  COGNITION: Overall cognitive status: Within functional limits for tasks assessed  SENSATION: WFL  POSTURE: rounded shoulders and forward head  PALPATION: TTP and trigger points noted in R UT    CERVICAL ROM:   Active ROM A/PROM (deg) eval AROM 05/15/23  Flexion WNL WNL  Extension WNL pain at end range but nothing severe WNL no pain just a pull  Right lateral flexion 75% Limited 25%  Left lateral flexion 75% pain in R UT Limited 25%  Right rotation WNL WNL  Left rotation WNL WNL   (Blank rows = not tested)  UPPER EXTREMITY ROM: good overall ROM within functional limits    UPPER EXTREMITY MMT: grossly 4+/5 BUE, shoulder abd 3+ BUE with pain    CERVICAL SPECIAL TESTS:  Upper limb tension test (ULTT): Negative, Spurling's test: Positive, and Distraction test: Positive   TREATMENT DATE:  05/18/23 NuStep L 5 x 6 min Rows & Ext green 2x10 Seated OHP 3lb dumbbell 2x10 Horiz abd green 2x10 Chin tucks 2x10 Backward shoulder rolls 2x10 Shoulder Flex 3lb 2x10 Shoulder abd 2lb  2x10  05/16/22 NuStep L 5 x 6 min Horiz abd green 2x10 Shoulder Ext 5lb 2x10 Shoulder ER red 2x10 Chin tucks 2x10 Shoulder abd 2# 2x10  Backward shoulder rolls x15 . 20lb triceps Ext  05/15/23 GOALS UBE L2 x3 min forward/3 min backward  Seated Rows & Lats 20lb 2x10 Chest press 5lb 2x10 Chin tucks 2x10 Shoulder flexion 2# 2x10  Shoulder abd 2# 2x10  Shoulder ER red 2x10  05/09/23  UBE L2 x3 min forward/3 min backward (one minute rest after changing directions)  Chin tucks x15  Chin tuck + rotation x10  B Chin tuck + lateral flexion x10 B Scap retractions x12  Backward shoulder rolls x15  Shoulder flexion 2# 2x10  Shoulder abd 2# 2x10   05/03/23 NuStep L5 x 6 min Sit to stand OHP red ball 2x8 Cervical retractions 2x10 Shoulder ER yellow 2x10 Shoulder Flex 2lb 2x10 Shoulder abd 2lb  2x10   04/26/23 AAROM Shoulder Flex, Ext, IR ups back x10 NuStep L 5 x 6 min Rows & Ext red 2x10 Shoulder ER yellow 2x10    04/20/23- EVAL                                                                                                                                 PATIENT EDUCATION:  Education details: POC and HEP Person educated: Patient Education method: Explanation Education comprehension: verbalized understanding  HOME EXERCISE PROGRAM: Access Code: [email or web address] URL: [email or web address] Date: 04/20/2023 Prepared by: Cassie Freer  Exercises - Seated Upper Trapezius Stretch  - 1 x daily - 7 x weekly - 2 sets - 3 reps - 15 hold - Seated Levator Scapulae Stretch  - 1 x daily - 7 x weekly - 2 sets - 3 reps - 15 hold - Seated Cervical Retraction  - 1 x daily - 7 x weekly - 2 sets - 10 reps - Doorway Pec Stretch at 90 Degrees Abduction  - 1 x daily - 7 x weekly - 2 sets - 3 reps - 15 hold  ASSESSMENT:  CLINICAL IMPRESSION:  Again pt arrives today feeling great with some L knee pain that started last night. Continued working on postural training and strengthening  today as tolerated. Increase resistance tolerated with shoulder flexion. Tactiel cues needed to prevent cervical and lumbar extension with chin tucks. She is very deconditioned and easily fatigued. No repots of increase pain today    OBJECTIVE IMPAIRMENTS: decreased ROM, decreased strength, impaired flexibility, postural dysfunction, and pain.   ACTIVITY LIMITATIONS: lifting, sleeping, and dressing  PARTICIPATION LIMITATIONS: cleaning, laundry, and driving  PERSONAL FACTORS: Age, Fitness, Past/current experiences, Time since onset of injury/illness/exacerbation, and 3+ comorbidities: CHF, DM, respiratory issues, HTN  are also affecting patient's functional outcome.   REHAB POTENTIAL: Good  CLINICAL DECISION MAKING: Stable/uncomplicated  EVALUATION COMPLEXITY: Low  GOALS: Goals reviewed with patient? Yes  SHORT TERM GOALS: Target date: 05/18/23  Patient will be independent with initial HEP.  Baseline: HEP given 04/20/23 Goal status: Met 05/15/23   LONG TERM GOALS: Target date:06/15/23  Patient will be independent with advanced/ongoing HEP to improve outcomes and carryover.  Baseline:  Goal status: INITIAL  2.  Patient will report 75% improvement in neck and upper arm pain to improve QOL.  Baseline: 10/10 esp at night Goal status: Progressing 50% better 05/15/23   3.  Patient will demonstrate full pain free cervical ROM  Baseline: see chart Goal status: Progressing 05/15/23  4.  Patient will able to sleep throughout the night without increase is pain and waking up  due to arm pain Baseline: unable to sleep at night Goal status: Met 05/15/23  PLAN:  PT FREQUENCY: 2x/week  PT DURATION: 8 weeks  PLANNED INTERVENTIONS: 97110-Therapeutic exercises, 97530- Therapeutic activity, 97112- Neuromuscular re-education, 97535- Self Care, 40981- Manual therapy, Patient/Family education, Taping, Dry Needling, Joint mobilization, Spinal mobilization, Cryotherapy, and Moist heat  PLAN FOR NEXT  SESSION: manual therapy for neck, stretching for neck, DN if she wants to try it, progress postural training   Debroah Baller, PTA  05/18/23 8:45 AM

## 2023-05-21 DIAGNOSIS — I509 Heart failure, unspecified: Secondary | ICD-10-CM | POA: Diagnosis not present

## 2023-05-21 DIAGNOSIS — I272 Pulmonary hypertension, unspecified: Secondary | ICD-10-CM | POA: Diagnosis not present

## 2023-05-21 DIAGNOSIS — J9610 Chronic respiratory failure, unspecified whether with hypoxia or hypercapnia: Secondary | ICD-10-CM | POA: Diagnosis not present

## 2023-05-22 ENCOUNTER — Ambulatory Visit: Admitting: Family Medicine

## 2023-05-24 ENCOUNTER — Telehealth: Payer: Self-pay

## 2023-05-24 DIAGNOSIS — Z789 Other specified health status: Secondary | ICD-10-CM

## 2023-05-24 NOTE — Patient Outreach (Signed)
 Complex Care Management   Visit Note  05/24/2023  Name:  Stacey Page MRN: [id number] DOB: [birth year]  Situation: Referral received for Complex Care Management related to  CPAP Supplies  I obtained verbal consent from Patient.  Visit completed with patient  on the phone  Background:   Past Medical History:  Diagnosis Date   Anxiety    doesn't take any meds   Arthritis of right knee 03/14/2016   Asthma    Back pain    Chronic diastolic CHF (congestive heart failure) (HCC)    HF with Preserved EF (60-65%) - Grade II Diastolic Dysfunction (Hypertensive Heart Disease). takes Furosemide daily   Depression    doesn't take meds   Diabetes (HCC)    takes Januvia daily   Eczema    uses cream as needed   GERD (gastroesophageal reflux disease)    History of bronchitis as a child    HTN (hypertension)    Insomnia    Moderate aortic stenosis by prior echocardiogram 04/2017   10/15/2021: Echo-ao calcification with moderate AS, MG 23 mmHg   Mycobacterium avium-intracellulare infection (HCC) 2016   OA (osteoarthritis)    Obese    OSA (obstructive sleep apnea) 02/25/2014   wears CPAP at night   Peripheral neuropathy    takes Gabapentin as needed   Pneumonia    hx of-2010   Seasonal allergies    uses Flonase daily   Sleep apnea     Assessment:  The patient contacted the care coordinator to inform us  that she had not received her CPAP supplies for approximately two months.  She has contacted the company multiple times, but they have been unable to clarify the status of her order.  We initiated a conference call with [employer] on [phone] and spoke with a representative named Mallissa Seals.  Tron reiterated that they would expedite the order.  When I inquired about additional requirements, Mallissa Seals indicated that verification of the orders is necessary before dispatch, a detail that was not communicated to the patient.  I recommended that the order be directed to her new physician.  Furthermore, I  will supply the patient with the contact number for the sales department at [phone], option 2.     05/04/2023    9:02 AM  Depression screen PHQ 2/9  Decreased Interest 0  Down, Depressed, Hopeless 0  PHQ - 2 Score 0    There were no vitals filed for this visit.  Medications Reviewed Today   Medications were not reviewed in this encounter     Recommendation:   PCP Follow-up DME requests:  other CPAP Supplies   Follow Up Plan:   No further follow up required  Augustin Leber RN, BSN, [facility]  [facility], [facility]  Care Coordinator Phone: [phone]

## 2023-05-26 DIAGNOSIS — I272 Pulmonary hypertension, unspecified: Secondary | ICD-10-CM | POA: Diagnosis not present

## 2023-05-26 DIAGNOSIS — I509 Heart failure, unspecified: Secondary | ICD-10-CM | POA: Diagnosis not present

## 2023-05-26 DIAGNOSIS — J9610 Chronic respiratory failure, unspecified whether with hypoxia or hypercapnia: Secondary | ICD-10-CM | POA: Diagnosis not present

## 2023-05-29 ENCOUNTER — Other Ambulatory Visit (HOSPITAL_COMMUNITY): Payer: Self-pay

## 2023-05-29 ENCOUNTER — Ambulatory Visit (INDEPENDENT_AMBULATORY_CARE_PROVIDER_SITE_OTHER): Payer: HMO

## 2023-05-29 ENCOUNTER — Other Ambulatory Visit: Payer: Self-pay

## 2023-05-29 DIAGNOSIS — Z0000 Encounter for general adult medical examination without abnormal findings: Secondary | ICD-10-CM

## 2023-05-29 NOTE — Progress Notes (Signed)
 Subjective:   Stacey Page is a 73 y.o. who presents for a Medicare Wellness preventive visit.  Visit Complete: Virtual I connected with  Stacey Page on 05/29/23 by a audio enabled telemedicine application and verified that I am speaking with the correct person using two identifiers.  Patient Location: Home  Provider Location: Office/Clinic  I discussed the limitations of evaluation and management by telemedicine. The patient expressed understanding and agreed to proceed.  Vital Signs: Because this visit was a virtual/telehealth visit, some criteria may be missing or patient reported. Any vitals not documented were not able to be obtained and vitals that have been documented are patient reported.  VideoError- Librarian, academic were attempted between this provider and patient, however failed, due to patient having technical difficulties OR patient did not have access to video capability.  We continued and completed visit with audio only.   Persons Participating in Visit: Patient.  AWV Questionnaire: No: Patient Medicare AWV questionnaire was not completed prior to this visit.  Cardiac Risk Factors include: advanced age (>59men, >79 women);dyslipidemia;hypertension;diabetes mellitus     Objective:    Today's Vitals   There is no height or weight on file to calculate BMI.     05/29/2023   10:53 AM 04/20/2023    3:05 PM 05/27/2022   10:32 AM 02/01/2022   12:39 PM 10/09/2021    4:38 PM 08/17/2021   11:14 AM 11/16/2020   11:31 AM  Advanced Directives  Does Patient Have a Medical Advance Directive? No Yes No No No No No  Would patient like information on creating a medical advance directive?     No - Patient declined No - Patient declined No - Patient declined    Current Medications (verified) Outpatient Encounter Medications as of 05/29/2023  Medication Sig   Accu-Chek Softclix Lancets lancets May check glucose up to 3 times daily.    albuterol  (VENTOLIN  HFA) 108 (90 Base) MCG/ACT inhaler INHALE 1-2 PUFFS into THE lungs EVERY 6 HOURS AS NEEDED FOR WHEEZING AND/OR SHORTNESS OF BREATH   aspirin  81 MG tablet Take 1 tablet (81 mg total) by mouth daily with breakfast.   atorvastatin  (LIPITOR) 20 MG tablet Take 1 tablet (20 mg total) by mouth daily.   Blood Glucose Monitoring Suppl (ONETOUCH VERIO [facility] SYSTEM) w/Device KIT 1 kit by Does not apply route daily as needed.   carvedilol  (COREG ) 25 MG tablet Take 1 tablet (25 mg total) by mouth 2 (two) times daily.   cholecalciferol (VITAMIN D3) 25 MCG (1000 UNIT) tablet Take 2,000 Units by mouth daily.   dapagliflozin  propanediol (FARXIGA ) 5 MG TABS tablet Take 1 tablet (5 mg total) by mouth daily before breakfast.   Docusate Sodium  100 MG capsule Take 100 mg by mouth 2 (two) times daily.   ferrous sulfate  325 (65 FE) MG tablet Take 325 mg by mouth 2 (two) times daily with a meal.   fluticasone  (FLONASE ) 50 MCG/ACT nasal spray Place 1 spray into both nostrils daily.   furosemide  (LASIX ) 80 MG tablet Take 1 tablet (80 mg total) by mouth daily.   gabapentin  (NEURONTIN ) 100 MG capsule Take 1 capsule (100 mg total) by mouth 2 (two) times daily.   glucose blood (ONETOUCH VERIO) test strip Use to monitor blood sugar twice daily as instructed   hydrALAZINE  (APRESOLINE ) 50 MG tablet Take 1 tablet (50 mg total) by mouth every 8 (eight) hours.   isosorbide  dinitrate (ISORDIL ) 10 MG tablet Take 1 tablet (10 mg total) by  mouth 3 (three) times daily. Please keep scheduled appointment   losartan  (COZAAR ) 25 MG tablet Take 0.5 tablets (12.5 mg total) by mouth daily.   montelukast  (SINGULAIR ) 10 MG tablet Take 1 tablet (10 mg total) by mouth at bedtime.   pantoprazole  (PROTONIX ) 40 MG tablet Take 1 tablet (40 mg total) by mouth daily.   potassium chloride  (KLOR-CON  M) 10 MEQ tablet Take 1 tablet (10 mEq total) by mouth daily.   Semaglutide , 1 MG/DOSE, 4 MG/3ML SOPN Inject 1 mg as directed once a week.    No facility-administered encounter medications on file as of 05/29/2023.    Allergies (verified) Patient has no known allergies.   History: Past Medical History:  Diagnosis Date   Anxiety    doesn't take any meds   Arthritis of right knee 03/14/2016   Asthma    Back pain    Chronic diastolic CHF (congestive heart failure) (HCC)    HF with Preserved EF (60-65%) - Grade II Diastolic Dysfunction (Hypertensive Heart Disease). takes Furosemide  daily   Depression    doesn't take meds   Diabetes (HCC)    takes Januvia  daily   Eczema    uses cream as needed   GERD (gastroesophageal reflux disease)    History of bronchitis as a child    HTN (hypertension)    Insomnia    Moderate aortic stenosis by prior echocardiogram 04/2017   10/15/2021: [place] calcification with moderate AS, MG 23 mmHg   Mycobacterium avium-intracellulare infection (HCC) 2016   OA (osteoarthritis)    Obese    OSA (obstructive sleep apnea) 02/25/2014   wears CPAP at night   Peripheral neuropathy    takes Gabapentin  as needed   Pneumonia    hx of-2010   Seasonal allergies    uses Flonase  daily   Sleep apnea    Past Surgical History:  Procedure Laterality Date   BREAST BIOPSY Right 03/2018   BREAST LUMPECTOMY WITH RADIOACTIVE SEED LOCALIZATION Right 12/28/2018   Procedure: RIGHT BREAST LUMPECTOMY WITH RADIOACTIVE SEED LOCALIZATION;  Surgeon: Caralyn Chandler, MD;  Location: [facility] OR;  Service: General;  Laterality: Right;   BREAST SURGERY     CARDIOPULMONARY EXERCISE TEST (CPX)  06/21/2021   (Performed on Coreg  25 mg twice daily): PFTs: FVC 1.76 (60%), FEV1 1.59 (77%), ratio 114% => RESTRICTIVE PHYSIOLOGY; 5 min- 1 mph - 2% grade. 7/10 dyspnea -> pulse ox range during exercise 95 to 98%, low of 90%, 97% on recovery; BP 106/84-160/64; heart rate 64-90 bpm (60% MPHR) -> Chronotropic Incompetence;;MAIN FACTOR for Exercise Limitation = BODY HABITUS w/ Chronotropic Incompetence.   CESAREAN SECTION  x2   COLONOSCOPY      CORONARY CALCIUM  SCORE & CT ANGIOGRAM  09/2017   Coronary Ca Score = 11 (low).  CTA- no obstructive CAD (minimal disease)   JOINT REPLACEMENT     KNEE ARTHROSCOPY Right    LUNG BIOPSY Right 06/10/2014   Procedure: LUNG BIOPSY;  Surgeon: Heriberto London, MD;  Location: [facility] OR;  Service: Thoracic;  Laterality: Right;   POLYPECTOMY     throat   RIGHT HEART CATH N/A 10/15/2021   Procedure: RIGHT HEART CATH;  Surgeon: Arty Binning, MD;  Location: [facility] INVASIVE CV LAB;  Service: CV: Mild Pulmonary Hypertension (WHO Group 3).  Mean PAP 28 mmHg, PCWP 13 mmHg.  PVR 2.73 [place].  RAP 6 mmHg.CO-CI 5.5L/min, 2.17 L/min/m.   TOTAL KNEE ARTHROPLASTY Right 03/14/2016   Procedure: RIGHT TOTAL KNEE ARTHROPLASTY;  Surgeon: Myrtle Atta  Murrel Arnt, MD;  Location: [facility] OR;  Service: Orthopedics;  Laterality: Right;   TRANSTHORACIC ECHOCARDIOGRAM  10/10/2021   EF 70 to 75%.  Hyperdynamic with no RWMA.  Moderate LVH.  Moderate LA dilation indicating likely diastolic dysfunction although not interpretable.  Moderately elevated PAP with normal RV function, dilated IVC with elevated RAP estimated at 15 mmHg..  Moderate MAC with no MR.  AOV calcification with Moderate AS-mean AvG 23 mmHg.   TRANSTHORACIC ECHOCARDIOGRAM  06/14/2021   EF 60 to 65%.  No RWMA.  Moderate cLVH with GR 1 DD.  Mild LA dilation.  Mild to moderate aortic calcification/stenosis.  Mean AVG 18 mmHg.   VIDEO ASSISTED THORACOSCOPY Right 06/10/2014   Procedure: VIDEO ASSISTED THORACOSCOPY;  Surgeon: Heriberto London, MD;  Location: [facility] OR;  Service: Thoracic;  Laterality: Right;   Family History  Problem Relation Age of Onset   COPD Father    Hypothyroidism Father    Anemia Father        iron deficiency   High blood pressure Father    Alcoholism Father    Cancer Mother 67       pancreatic   High blood pressure Mother    High Cholesterol Mother    Breast cancer Neg Hx    Social History   Socioeconomic History   Marital status: Widowed    Spouse name:  Not on file   Number of children: Not on file   Years of education: Not on file   Highest education level: Not on file  Occupational History   Occupation: retired  Tobacco Use   Smoking status: Former    Current packs/day: 0.00    Average packs/day: 0.3 packs/day for 15.0 years (3.8 ttl pk-yrs)    Types: Cigarettes    Start date: 02/07/1965    Quit date: 02/08/1980    Years since quitting: 43.3   Smokeless tobacco: Former  Building services engineer status: Never Used  Substance and Sexual Activity   Alcohol use: Not Currently    Comment: occassional/social/rare   Drug use: Not Currently   Sexual activity: Not Currently  Other Topics Concern   Not on file  Social History Narrative   Not on file   Social Drivers of Health   Financial Resource Strain: Patient Declined (05/29/2023)   Overall Financial Resource Strain (CARDIA)    Difficulty of Paying Living Expenses: Patient declined  Food Insecurity: No Food Insecurity (05/29/2023)   Hunger Vital Sign    Worried About Running Out of Food in the Last Year: Never true    Ran Out of Food in the Last Year: Never true  Transportation Needs: No Transportation Needs (05/29/2023)   PRAPARE - Administrator, Civil Service (Medical): No    Lack of Transportation (Non-Medical): No  Physical Activity: Inactive (05/29/2023)   Exercise Vital Sign    Days of Exercise per Week: 0 days    Minutes of Exercise per Session: 0 min  Stress: Patient Declined (05/29/2023)   [employer] of Occupational Health - Occupational Stress Questionnaire    Feeling of Stress : Patient declined  Social Connections: Patient Declined (05/29/2023)   Social Connection and Isolation Panel [NHANES]    Frequency of Communication with Friends and Family: Patient declined    Frequency of Social Gatherings with Friends and Family: Patient declined    Attends Religious Services: Patient declined    Active Member of Clubs or Organizations: Patient declined     Attends [employer]  or Organization Meetings: Patient declined    Marital Status: Patient declined    Tobacco Counseling Counseling given: Not Answered    Clinical Intake:  Pre-visit preparation completed: Yes  Pain : No/denies pain     Nutritional Risks: None Diabetes: Yes CBG done?: No Did pt. bring in CBG monitor from home?: No  Lab Results  Component Value Date   HGBA1C 5.7 (H) 05/02/2023   HGBA1C 6.0 04/18/2023   HGBA1C 6.2 (H) 12/12/2022     How often do you need to have someone help you when you read instructions, pamphlets, or other written materials from your doctor or pharmacy?: 1 - Never  Interpreter Needed?: No  Information entered by :: NAllen LPN   Activities of Daily Living     05/29/2023   10:47 AM  In your present state of health, do you have any difficulty performing the following activities:  Hearing? 0  Vision? 0  Difficulty concentrating or making decisions? 0  Walking or climbing stairs? 1  Dressing or bathing? 0  Doing errands, shopping? 0  Preparing Food and eating ? N  Using the Toilet? N  In the past six months, have you accidently leaked urine? N  Do you have problems with loss of bowel control? N  Managing your Medications? N  Managing your Finances? N  Housekeeping or managing your Housekeeping? N    Patient Care Team: Tonna Frederic, MD as PCP - General (Family Medicine) Arleen Lacer, MD as PCP - Cardiology (Cardiology) Duke, Warren Haber, PA as Physician Assistant (Cardiology) Tere Felts, NP as Nurse Practitioner (Nephrology) Augustin Leber, RN as [facility] Care Management  Indicate any recent Medical Services you may have received from other than [facility] providers in the past year (date may be approximate).     Assessment:   This is a routine wellness examination for Stacey Page.  Hearing/Vision screen Hearing Screening - Comments:: Denies hearing issues Vision Screening - Comments:: Regular eye  exams,    Goals Addressed             This Visit's Progress    Patient Stated       05/29/2023, wants to lose weight       Depression Screen     05/29/2023   10:56 AM 05/04/2023    9:02 AM 03/30/2023   10:13 AM 08/23/2022   11:15 AM 08/23/2022   11:10 AM 07/14/2022    1:13 PM 05/27/2022   10:35 AM  PHQ 2/9 Scores  PHQ - 2 Score 0 0 0 0 0 0 0    Fall Risk     05/29/2023   10:55 AM 05/04/2023    9:02 AM 03/30/2023   10:13 AM 03/03/2023    9:36 AM 08/23/2022   11:14 AM  Fall Risk   Falls in the past year? 0 0 0 0 0  Number falls in past yr: 0 0 0  0  Injury with Fall? 0 0 0  0  Risk for fall due to : Medication side effect No Fall Risks No Fall Risks  No Fall Risks  Follow up Falls prevention discussed;Falls evaluation completed Falls prevention discussed Falls evaluation completed  Falls evaluation completed    MEDICARE RISK AT HOME:  Medicare Risk at Home Any stairs in or around the home?: No If so, are there any without handrails?: No Home free of loose throw rugs in walkways, pet beds, electrical cords, etc?: Yes Adequate lighting in your home to reduce  risk of falls?: Yes Life alert?: No Use of a cane, walker or w/c?: No Grab bars in the bathroom?: No Shower chair or bench in shower?: Yes Elevated toilet seat or a handicapped toilet?: No  TIMED UP AND GO:  Was the test performed?  No  Cognitive Function: 6CIT completed    11/28/2018   11:19 AM  MMSE - Mini Mental State Exam  Orientation to time 5  Orientation to Place 5  Registration 3  Attention/ Calculation 5  Recall 3  Language- name 2 objects 2  Language- repeat 1  Language- follow 3 step command 3  Language- read & follow direction 1  Write a sentence 1  Copy design 1  Total score 30        05/29/2023   10:57 AM 05/27/2022   10:38 AM 07/31/2019    8:34 AM  6CIT Screen  What Year? 0 points 0 points 0 points  What month? 0 points 0 points 0 points  What time? 0 points 0 points 0 points   Count back from 20 0 points 0 points 0 points  Months in reverse 0 points 0 points 0 points  Repeat phrase 0 points 2 points 0 points  Total Score 0 points 2 points 0 points    Immunizations Immunization History  Administered Date(s) Administered   Fluad Quad(high Dose 65+) 11/28/2018, 01/29/2020   Influenza Split 11/21/2016   Influenza, High Dose Seasonal PF 12/02/2021   Influenza,inj,Quad PF,6+ Mos 01/12/2015, 11/19/2015   PFIZER(Purple Top)SARS-COV-2 Vaccination 03/31/2019, 04/23/2019, 03/21/2020   Pneumococcal Conjugate-13 01/19/2017   Pneumococcal Polysaccharide-23 01/12/2015, 05/14/2020   Zoster Recombinant(Shingrix ) 07/14/2021, 12/02/2021    Screening Tests Health Maintenance  Topic Date Due   FOOT EXAM  01/14/2023   OPHTHALMOLOGY EXAM  03/29/2023   Diabetic kidney evaluation - Urine ACR  04/17/2028 (Originally 05/14/2021)   INFLUENZA VACCINE  09/08/2023   MAMMOGRAM  10/03/2023   HEMOGLOBIN A1C  11/02/2023   Fecal DNA (Cologuard)  11/12/2023   Diabetic kidney evaluation - eGFR measurement  05/01/2024   Medicare Annual Wellness (AWV)  05/28/2024   Pneumonia Vaccine 53+ Years old  Completed   DEXA SCAN  Completed   Hepatitis C Screening  Completed   HPV VACCINES  Aged Out   Meningococcal B Vaccine  Aged Out   DTaP/Tdap/Td  Discontinued   COVID-19 Vaccine  Discontinued   Zoster Vaccines- Shingrix   Discontinued    Health Maintenance  Health Maintenance Due  Topic Date Due   FOOT EXAM  01/14/2023   OPHTHALMOLOGY EXAM  03/29/2023   Health Maintenance Items Addressed: Due for foot exam and eye exam coming up soon.  Additional Screening:  Vision Screening: Recommended annual ophthalmology exams for early detection of glaucoma and other disorders of the eye.  Dental Screening: Recommended annual dental exams for proper oral hygiene  Community Resource Referral / Chronic Care Management: CRR required this visit?  No   CCM required this visit?  No     Plan:      I have personally reviewed and noted the following in the patient's chart:   Medical and social history Use of alcohol, tobacco or illicit drugs  Current medications and supplements including opioid prescriptions. Patient is not currently taking opioid prescriptions. Functional ability and status Nutritional status Physical activity Advanced directives List of other physicians Hospitalizations, surgeries, and ER visits in previous 12 months Vitals Screenings to include cognitive, depression, and falls Referrals and appointments  In addition, I have reviewed and discussed  with patient certain preventive protocols, quality metrics, and best practice recommendations. A written personalized care plan for preventive services as well as general preventive health recommendations were provided to patient.     Areatha Beecham, LPN   9/60/[id number]   After Visit Summary: (MyChart) Due to this being a telephonic visit, the after visit summary with patients personalized plan was offered to patient via MyChart   Notes: Nothing significant to report at this time.

## 2023-05-29 NOTE — Patient Instructions (Signed)
 Stacey Page , Thank you for taking time to come for your Medicare Wellness Visit. I appreciate your ongoing commitment to your health goals. Please review the following plan we discussed and let me know if I can assist you in the future.   Referrals/Orders/Follow-Ups/Clinician Recommendations: none  This is a list of the screening recommended for you and due dates:  Health Maintenance  Topic Date Due   Complete foot exam   01/14/2023   Eye exam for diabetics  03/29/2023   Yearly kidney health urinalysis for diabetes  04/17/2028*   Flu Shot  09/08/2023   Mammogram  10/03/2023   Hemoglobin A1C  11/02/2023   Cologuard (Stool DNA test)  11/12/2023   Yearly kidney function blood test for diabetes  05/01/2024   Medicare Annual Wellness Visit  05/28/2024   Pneumonia Vaccine  Completed   DEXA scan (bone density measurement)  Completed   Hepatitis C Screening  Completed   HPV Vaccine  Aged Out   Meningitis B Vaccine  Aged Out   DTaP/Tdap/Td vaccine  Discontinued   COVID-19 Vaccine  Discontinued   Zoster (Shingles) Vaccine  Discontinued  *Topic was postponed. The date shown is not the original due date.    Advanced directives: (ACP Link)Information on Advanced Care Planning can be found at [place]  Secretary of [facility] Advance Health Care Directives Advance Health Care Directives. [email or web address]   Next Medicare Annual Wellness Visit scheduled for next year: Yes  insert Preventive Care attachment Insert FALL PREVENTION attachment if needed

## 2023-05-30 ENCOUNTER — Telehealth: Payer: Self-pay

## 2023-05-30 ENCOUNTER — Ambulatory Visit: Admitting: Physical Therapy

## 2023-05-30 ENCOUNTER — Encounter: Payer: Self-pay | Admitting: Physical Therapy

## 2023-05-30 DIAGNOSIS — M5412 Radiculopathy, cervical region: Secondary | ICD-10-CM

## 2023-05-30 DIAGNOSIS — M6281 Muscle weakness (generalized): Secondary | ICD-10-CM

## 2023-05-30 DIAGNOSIS — M542 Cervicalgia: Secondary | ICD-10-CM

## 2023-05-30 DIAGNOSIS — M25511 Pain in right shoulder: Secondary | ICD-10-CM

## 2023-05-30 NOTE — Therapy (Signed)
 OUTPATIENT PHYSICAL THERAPY CERVICAL TREATMENT   Patient Name: Stacey Page MRN: [id number] DOB:1950-05-09, 73 y.o., female Today's Date: 05/30/2023  END OF SESSION:  PT End of Session - 05/30/23 1300     Visit Number 8    Date for PT Re-Evaluation 06/15/23    PT Start Time 1300    PT Stop Time 1345    PT Time Calculation (min) 45 min    Activity Tolerance Patient tolerated treatment well    Behavior During Therapy [facility] for tasks assessed/performed              Past Medical History:  Diagnosis Date   Anxiety    doesn't take any meds   Arthritis of right knee 03/14/2016   Asthma    Back pain    Chronic diastolic CHF (congestive heart failure) (HCC)    HF with Preserved EF (60-65%) - Grade II Diastolic Dysfunction (Hypertensive Heart Disease). takes Furosemide  daily   Depression    doesn't take meds   Diabetes (HCC)    takes Januvia  daily   Eczema    uses cream as needed   GERD (gastroesophageal reflux disease)    History of bronchitis as a child    HTN (hypertension)    Insomnia    Moderate aortic stenosis by prior echocardiogram 04/2017   10/15/2021: [place] calcification with moderate AS, MG 23 mmHg   Mycobacterium avium-intracellulare infection (HCC) 2016   OA (osteoarthritis)    Obese    OSA (obstructive sleep apnea) 02/25/2014   wears CPAP at night   Peripheral neuropathy    takes Gabapentin  as needed   Pneumonia    hx of-2010   Seasonal allergies    uses Flonase  daily   Sleep apnea    Past Surgical History:  Procedure Laterality Date   BREAST BIOPSY Right 03/2018   BREAST LUMPECTOMY WITH RADIOACTIVE SEED LOCALIZATION Right 12/28/2018   Procedure: RIGHT BREAST LUMPECTOMY WITH RADIOACTIVE SEED LOCALIZATION;  Surgeon: Caralyn Chandler, MD;  Location: [facility] OR;  Service: General;  Laterality: Right;   BREAST SURGERY     CARDIOPULMONARY EXERCISE TEST (CPX)  06/21/2021   (Performed on Coreg  25 mg twice daily): PFTs: FVC 1.76 (60%), FEV1 1.59 (77%), ratio  114% => RESTRICTIVE PHYSIOLOGY; 5 min- 1 mph - 2% grade. 7/10 dyspnea -> pulse ox range during exercise 95 to 98%, low of 90%, 97% on recovery; BP 106/84-160/64; heart rate 64-90 bpm (60% MPHR) -> Chronotropic Incompetence;;MAIN FACTOR for Exercise Limitation = BODY HABITUS w/ Chronotropic Incompetence.   CESAREAN SECTION  x2   COLONOSCOPY     CORONARY CALCIUM  SCORE & CT ANGIOGRAM  09/2017   Coronary Ca Score = 11 (low).  CTA- no obstructive CAD (minimal disease)   JOINT REPLACEMENT     KNEE ARTHROSCOPY Right    LUNG BIOPSY Right 06/10/2014   Procedure: LUNG BIOPSY;  Surgeon: Heriberto London, MD;  Location: [facility] OR;  Service: Thoracic;  Laterality: Right;   POLYPECTOMY     throat   RIGHT HEART CATH N/A 10/15/2021   Procedure: RIGHT HEART CATH;  Surgeon: Arty Binning, MD;  Location: [facility] INVASIVE CV LAB;  Service: CV: Mild Pulmonary Hypertension (WHO Group 3).  Mean PAP 28 mmHg, PCWP 13 mmHg.  PVR 2.73 [place].  RAP 6 mmHg.CO-CI 5.5L/min, 2.17 L/min/m.   TOTAL KNEE ARTHROPLASTY Right 03/14/2016   Procedure: RIGHT TOTAL KNEE ARTHROPLASTY;  Surgeon: Adah Acron, MD;  Location: [facility] OR;  Service: Orthopedics;  Laterality: Right;   TRANSTHORACIC  ECHOCARDIOGRAM  10/10/2021   EF 70 to 75%.  Hyperdynamic with no RWMA.  Moderate LVH.  Moderate LA dilation indicating likely diastolic dysfunction although not interpretable.  Moderately elevated PAP with normal RV function, dilated IVC with elevated RAP estimated at 15 mmHg..  Moderate MAC with no MR.  AOV calcification with Moderate AS-mean AvG 23 mmHg.   TRANSTHORACIC ECHOCARDIOGRAM  06/14/2021   EF 60 to 65%.  No RWMA.  Moderate cLVH with GR 1 DD.  Mild LA dilation.  Mild to moderate aortic calcification/stenosis.  Mean AVG 18 mmHg.   VIDEO ASSISTED THORACOSCOPY Right 06/10/2014   Procedure: VIDEO ASSISTED THORACOSCOPY;  Surgeon: Heriberto London, MD;  Location: [facility] OR;  Service: Thoracic;  Laterality: Right;   Patient Active Problem List   Diagnosis Date  Noted   Subacromial bursitis of right shoulder joint 04/18/2023   Asthma 03/03/2023   Urinary frequency 02/03/2023   Viral upper respiratory tract infection 12/20/2022   Dysthymia 02/08/2022   Hypokalemia 01/26/2022   Mild pulmonary hypertension (HCC) 01/09/2022   Need for influenza vaccination 12/02/2021   Dysfunction of left eustachian tube 12/02/2021   Allergic rhinitis 12/02/2021   Chronic respiratory failure with hypoxia (HCC) 11/11/2021   Demand ischemia (HCC)    Respiratory acidosis    Acute respiratory failure with hypercapnia (HCC)    Acute on chronic heart failure with preserved ejection fraction (HCC)    CHF (congestive heart failure) (HCC) 10/09/2021   Reactive airway disease 09/21/2021   Viral syndrome 09/21/2021   Hypertensive retinopathy of right eye, grade 1 08/11/2021   Grade 2 hypertensive retinopathy, left 08/11/2021   Nuclear sclerotic cataract of both eyes 08/11/2021   Need for shingles vaccine 07/14/2021   Iron deficiency anemia 01/13/2021   Stage 4 chronic kidney disease (HCC) 07/09/2019   Type 2 diabetes mellitus with obesity (HCC) 07/02/2019   Polyneuropathy associated with underlying disease (HCC) 11/28/2018   Severe episode of recurrent major depressive disorder, without psychotic features (HCC) 11/28/2018   Type 2 diabetes mellitus with diabetic peripheral angiopathy without gangrene, without long-term current use of insulin  (HCC) 11/28/2018   Poor short term memory 11/28/2018   Moderate aortic stenosis by prior echocardiogram 11/06/2018   Diabetes mellitus (HCC) 03/21/2017   Acute renal failure superimposed on stage 2 chronic kidney disease (HCC) 03/17/2016   Symptomatic anemia 03/17/2016   S/P total knee arthroplasty, right 03/17/2016   Unilateral primary osteoarthritis, right knee    Hyperlipidemia associated with type 2 diabetes mellitus (HCC) 12/15/2015   Pain in both lower extremities 11/19/2015   Numbness in both hands 08/13/2015   Panic  attack 06/09/2015   Right knee pain 05/14/2015   Chronic renal insufficiency 09/16/2014   Pneumothorax on right    Atypical mycobacterium infection    ILD (interstitial lung disease) 2/2 MAI s/p med rxn    Class 3 severe obesity with serious comorbidity and body mass index (BMI) of 50.0 to 59.9 in adult (HCC) 04/03/2014   OSA (obstructive sleep apnea) 02/25/2014   Hypertensive heart disease with chronic diastolic congestive heart failure (HCC) 02/25/2014   DOE (dyspnea on exertion) 02/25/2014   Essential hypertension 02/25/2014    PCP: Christianna Cowman  REFERRING PROVIDER: Raydell Cahill  REFERRING DIAG:  [phone] (ICD-10-CM) - Cervical radiculopathy    THERAPY DIAG:  Cervicalgia  Acute pain of right shoulder  Muscle weakness (generalized)  Radiculopathy, cervical region  Rationale for Evaluation and Treatment: Rehabilitation  ONSET DATE: "a couple of weeks"  SUBJECTIVE:  SUBJECTIVE STATEMENT:  "Great" Hand dominance: Right  PERTINENT HISTORY:  Patient is a very pleasant right-hand-dominant 73 year old female that presents today with a couple of weeks of right arm pain.  Her pain is most noticeable when she holds the arm in a dependent position by her side.  Pain is alleviated when raising the arm directly overhead.  She describes it as a tingling type of sensation that localizes itself to the upper arm.  No pain past the elbow.  She denies any weakness in the arm.  No similar issues in the past. Patient will start physical therapy.  She takes 100 mg of gabapentin  twice daily for neuropathy and I recommended that she increase that to 100 mg 3 times daily.  She will discuss that with her PCP.  If symptoms persist or worsen then consider imaging at that time including x-ray and MRI.   Follow-up for ongoing or recalcitrant issues.   PAIN:  Are you having pain? Yes: NPRS scale: 0/10 Pain location: L knee Pain description: sharp then goes numb  Aggravating factors: laying down, household chores, getting dressed  Relieving factors: PT   PRECAUTIONS: None  RED FLAGS: None    WEIGHT BEARING RESTRICTIONS: No  FALLS:  Has patient fallen in last 6 months? Yes. Number of falls I had 1, I passed out this Saturday after I was sick and vomiting   LIVING ENVIRONMENT: Lives with: lives with their family Lives in: House/apartment Stairs: No Has following equipment at home: Environmental consultant - 2 wheeled  OCCUPATION: retired   PLOF: Independent  PATIENT GOALS: I want to release this as soon as I can   NEXT MD VISIT: have not scheduled yet  OBJECTIVE:  Note: Objective measures were completed at Evaluation unless otherwise noted.  DIAGNOSTIC FINDINGS:  N/A  COGNITION: Overall cognitive status: Within functional limits for tasks assessed  SENSATION: WFL  POSTURE: rounded shoulders and forward head  PALPATION: TTP and trigger points noted in R UT    CERVICAL ROM:   Active ROM A/PROM (deg) eval AROM 05/15/23  Flexion WNL WNL  Extension WNL pain at end range but nothing severe WNL no pain just a pull  Right lateral flexion 75% Limited 25%  Left lateral flexion 75% pain in R UT Limited 25%  Right rotation WNL WNL  Left rotation WNL WNL   (Blank rows = not tested)  UPPER EXTREMITY ROM: good overall ROM within functional limits    UPPER EXTREMITY MMT: grossly 4+/5 BUE, shoulder abd 3+ BUE with pain    CERVICAL SPECIAL TESTS:  Upper limb tension test (ULTT): Negative, Spurling's test: Positive, and Distraction test: Positive   TREATMENT DATE:  05/30/23 NuStep L 5 x 7 min Shoulder Ext 10lb 2x10 Standing Rows 10lb 2x10  Seated OHP 3lb dumbbell 2x10 Chin tucks 2x10 Horiz abd green 2x10  05/18/23 NuStep L 5 x 6 min Rows & Ext green 2x10 Seated OHP 3lb  dumbbell 2x10 Horiz abd green 2x10 Chin tucks 2x10 Backward shoulder rolls 2x10 Shoulder Flex 3lb 2x10 Shoulder abd 2lb 2x10  05/16/22 NuStep L 5 x 6 min Horiz abd green 2x10 Shoulder Ext 5lb 2x10 Shoulder ER red 2x10 Chin tucks 2x10 Shoulder abd 2# 2x10  Backward shoulder rolls x15 . 20lb triceps Ext  05/15/23 GOALS UBE L2 x3 min forward/3 min backward  Seated Rows & Lats 20lb 2x10 Chest press 5lb 2x10 Chin tucks 2x10 Shoulder flexion 2# 2x10  Shoulder abd 2# 2x10  Shoulder ER red 2x10  05/09/23  UBE L2 x3 min forward/3 min backward (one minute rest after changing directions)  Chin tucks x15  Chin tuck + rotation x10 B Chin tuck + lateral flexion x10 B Scap retractions x12  Backward shoulder rolls x15  Shoulder flexion 2# 2x10  Shoulder abd 2# 2x10   05/03/23 NuStep L5 x 6 min Sit to stand OHP red ball 2x8 Cervical retractions 2x10 Shoulder ER yellow 2x10 Shoulder Flex 2lb 2x10 Shoulder abd 2lb  2x10   04/26/23 AAROM Shoulder Flex, Ext, IR ups back x10 NuStep L 5 x 6 min Rows & Ext red 2x10 Shoulder ER yellow 2x10    04/20/23- EVAL                                                                                                                                 PATIENT EDUCATION:  Education details: POC and HEP Person educated: Patient Education method: Explanation Education comprehension: verbalized understanding  HOME EXERCISE PROGRAM: Access Code: [email or web address] URL: [email or web address] Date: 04/20/2023 Prepared by: Donavon Fudge  Exercises - Seated Upper Trapezius Stretch  - 1 x daily - 7 x weekly - 2 sets - 3 reps - 15 hold - Seated Levator Scapulae Stretch  - 1 x daily - 7 x weekly - 2 sets - 3 reps - 15 hold - Seated Cervical Retraction  - 1 x daily - 7 x weekly - 2 sets - 10 reps - Doorway Pec Stretch at 90 Degrees Abduction  - 1 x daily - 7 x weekly - 2 sets - 3 reps - 15 hold  ASSESSMENT:  CLINICAL IMPRESSION:  Again pt arrives  today feeling great. Some L knee pain reported on NuStep. Continued working on postural training and strengthening today as tolerated. Tactiel cues needed to prevent cervical and lumbar extension with chin tucks. She is very deconditioned and easily fatigued. Standing row was taxing on pt maintaining stability. Will continue to progress   OBJECTIVE IMPAIRMENTS: decreased ROM, decreased strength, impaired flexibility, postural dysfunction, and pain.   ACTIVITY LIMITATIONS: lifting, sleeping, and dressing  PARTICIPATION LIMITATIONS: cleaning, laundry, and driving  PERSONAL FACTORS: Age, Fitness, Past/current experiences, Time since onset of injury/illness/exacerbation, and 3+ comorbidities: CHF, DM, respiratory issues, HTN  are also affecting patient's functional outcome.   REHAB POTENTIAL: Good  CLINICAL DECISION MAKING: Stable/uncomplicated  EVALUATION COMPLEXITY: Low  GOALS: Goals reviewed with patient? Yes  SHORT TERM GOALS: Target date: 05/18/23  Patient will be independent with initial HEP.  Baseline: HEP given 04/20/23 Goal status: Met 05/15/23   LONG TERM GOALS: Target date:06/15/23  Patient will be independent with advanced/ongoing HEP to improve outcomes and carryover.  Baseline:  Goal status: INITIAL  2.  Patient will report 75% improvement in neck and upper arm pain to improve QOL.  Baseline: 10/10 esp at night Goal status: Progressing 50% better 05/15/23   3.  Patient will demonstrate full pain free cervical ROM  Baseline: see chart Goal status:  Progressing 05/15/23  4.  Patient will able to sleep throughout the night without increase is pain and waking up due to arm pain Baseline: unable to sleep at night Goal status: Met 05/15/23  PLAN:  PT FREQUENCY: 2x/week  PT DURATION: 8 weeks  PLANNED INTERVENTIONS: 97110-Therapeutic exercises, 97530- Therapeutic activity, 97112- Neuromuscular re-education, 97535- Self Care, [id number]- Manual therapy, Patient/Family education,  Taping, Dry Needling, Joint mobilization, Spinal mobilization, Cryotherapy, and Moist heat  PLAN FOR NEXT SESSION: manual therapy for neck, stretching for neck, DN if she wants to try it, progress postural training   Towanda Fret, PTA  05/30/23 1:00 PM

## 2023-05-30 NOTE — Telephone Encounter (Signed)
 Did we call [employer] to see what the issues are and why this is happening ?

## 2023-05-30 NOTE — Telephone Encounter (Signed)
 Copied from CRM [id number]. Topic: Clinical - Prescription Issue >> May 29, 2023 12:39 PM Stacey Page wrote: Reason for CRM: pt would like the order for her CPAP supplies sent to [facility]. Pt states she she is having issues w/ [employer].  They have been telling her since this past Feb they are gooing to send her supplies, but they have not.

## 2023-05-31 DIAGNOSIS — G4733 Obstructive sleep apnea (adult) (pediatric): Secondary | ICD-10-CM | POA: Diagnosis not present

## 2023-05-31 NOTE — Telephone Encounter (Signed)
 Spoke with [facility] regarding prior message. Patient and patient's nurse has called several times for patient to get CPAP supplies patient was wanting to change to another DME company . I have spoke with [employer] and they told me 7-10 days for patient to get her CPAP supplies . [facility] could not tell me the hold up but did see a order in patient's chart . Patient stated she will contact me back in 10days if she has not received  her CPAP supplies  Will keep this encounter open until patient contact's me back.

## 2023-06-05 ENCOUNTER — Ambulatory Visit: Admitting: Physical Therapy

## 2023-06-05 ENCOUNTER — Encounter: Payer: Self-pay | Admitting: Physical Therapy

## 2023-06-05 DIAGNOSIS — M542 Cervicalgia: Secondary | ICD-10-CM

## 2023-06-05 DIAGNOSIS — M6281 Muscle weakness (generalized): Secondary | ICD-10-CM

## 2023-06-05 DIAGNOSIS — M5412 Radiculopathy, cervical region: Secondary | ICD-10-CM | POA: Diagnosis not present

## 2023-06-05 DIAGNOSIS — M25511 Pain in right shoulder: Secondary | ICD-10-CM

## 2023-06-05 NOTE — Therapy (Signed)
 OUTPATIENT PHYSICAL THERAPY CERVICAL TREATMENT   Patient Name: Stacey Page MRN: [id number] DOB:1950/12/23, 73 y.o., female Today's Date: 06/05/2023  END OF SESSION:  PT End of Session - 06/05/23 1018     Visit Number 9    Date for PT Re-Evaluation 06/15/23    Authorization Type HTA    PT Start Time 1015    PT Stop Time 1100    PT Time Calculation (min) 45 min              Past Medical History:  Diagnosis Date   Anxiety    doesn't take any meds   Arthritis of right knee 03/14/2016   Asthma    Back pain    Chronic diastolic CHF (congestive heart failure) (HCC)    HF with Preserved EF (60-65%) - Grade II Diastolic Dysfunction (Hypertensive Heart Disease). takes Furosemide  daily   Depression    doesn't take meds   Diabetes (HCC)    takes Januvia  daily   Eczema    uses cream as needed   GERD (gastroesophageal reflux disease)    History of bronchitis as a child    HTN (hypertension)    Insomnia    Moderate aortic stenosis by prior echocardiogram 04/2017   10/15/2021: [place] calcification with moderate AS, MG 23 mmHg   Mycobacterium avium-intracellulare infection (HCC) 2016   OA (osteoarthritis)    Obese    OSA (obstructive sleep apnea) 02/25/2014   wears CPAP at night   Peripheral neuropathy    takes Gabapentin  as needed   Pneumonia    hx of-2010   Seasonal allergies    uses Flonase  daily   Sleep apnea    Past Surgical History:  Procedure Laterality Date   BREAST BIOPSY Right 03/2018   BREAST LUMPECTOMY WITH RADIOACTIVE SEED LOCALIZATION Right 12/28/2018   Procedure: RIGHT BREAST LUMPECTOMY WITH RADIOACTIVE SEED LOCALIZATION;  Surgeon: Caralyn Chandler, MD;  Location: [facility] OR;  Service: General;  Laterality: Right;   BREAST SURGERY     CARDIOPULMONARY EXERCISE TEST (CPX)  06/21/2021   (Performed on Coreg  25 mg twice daily): PFTs: FVC 1.76 (60%), FEV1 1.59 (77%), ratio 114% => RESTRICTIVE PHYSIOLOGY; 5 min- 1 mph - 2% grade. 7/10 dyspnea -> pulse ox range  during exercise 95 to 98%, low of 90%, 97% on recovery; BP 106/84-160/64; heart rate 64-90 bpm (60% MPHR) -> Chronotropic Incompetence;;MAIN FACTOR for Exercise Limitation = BODY HABITUS w/ Chronotropic Incompetence.   CESAREAN SECTION  x2   COLONOSCOPY     CORONARY CALCIUM  SCORE & CT ANGIOGRAM  09/2017   Coronary Ca Score = 11 (low).  CTA- no obstructive CAD (minimal disease)   JOINT REPLACEMENT     KNEE ARTHROSCOPY Right    LUNG BIOPSY Right 06/10/2014   Procedure: LUNG BIOPSY;  Surgeon: Heriberto London, MD;  Location: [facility] OR;  Service: Thoracic;  Laterality: Right;   POLYPECTOMY     throat   RIGHT HEART CATH N/A 10/15/2021   Procedure: RIGHT HEART CATH;  Surgeon: Arty Binning, MD;  Location: [facility] INVASIVE CV LAB;  Service: CV: Mild Pulmonary Hypertension (WHO Group 3).  Mean PAP 28 mmHg, PCWP 13 mmHg.  PVR 2.73 [place].  RAP 6 mmHg.CO-CI 5.5L/min, 2.17 L/min/m.   TOTAL KNEE ARTHROPLASTY Right 03/14/2016   Procedure: RIGHT TOTAL KNEE ARTHROPLASTY;  Surgeon: Adah Acron, MD;  Location: [facility] OR;  Service: Orthopedics;  Laterality: Right;   TRANSTHORACIC ECHOCARDIOGRAM  10/10/2021   EF 70 to 75%.  Hyperdynamic with no  RWMA.  Moderate LVH.  Moderate LA dilation indicating likely diastolic dysfunction although not interpretable.  Moderately elevated PAP with normal RV function, dilated IVC with elevated RAP estimated at 15 mmHg..  Moderate MAC with no MR.  AOV calcification with Moderate AS-mean AvG 23 mmHg.   TRANSTHORACIC ECHOCARDIOGRAM  06/14/2021   EF 60 to 65%.  No RWMA.  Moderate cLVH with GR 1 DD.  Mild LA dilation.  Mild to moderate aortic calcification/stenosis.  Mean AVG 18 mmHg.   VIDEO ASSISTED THORACOSCOPY Right 06/10/2014   Procedure: VIDEO ASSISTED THORACOSCOPY;  Surgeon: Heriberto London, MD;  Location: [facility] OR;  Service: Thoracic;  Laterality: Right;   Patient Active Problem List   Diagnosis Date Noted   Subacromial bursitis of right shoulder joint 04/18/2023   Asthma 03/03/2023    Urinary frequency 02/03/2023   Viral upper respiratory tract infection 12/20/2022   Dysthymia 02/08/2022   Hypokalemia 01/26/2022   Mild pulmonary hypertension (HCC) 01/09/2022   Need for influenza vaccination 12/02/2021   Dysfunction of left eustachian tube 12/02/2021   Allergic rhinitis 12/02/2021   Chronic respiratory failure with hypoxia (HCC) 11/11/2021   Demand ischemia (HCC)    Respiratory acidosis    Acute respiratory failure with hypercapnia (HCC)    Acute on chronic heart failure with preserved ejection fraction (HCC)    CHF (congestive heart failure) (HCC) 10/09/2021   Reactive airway disease 09/21/2021   Viral syndrome 09/21/2021   Hypertensive retinopathy of right eye, grade 1 08/11/2021   Grade 2 hypertensive retinopathy, left 08/11/2021   Nuclear sclerotic cataract of both eyes 08/11/2021   Need for shingles vaccine 07/14/2021   Iron deficiency anemia 01/13/2021   Stage 4 chronic kidney disease (HCC) 07/09/2019   Type 2 diabetes mellitus with obesity (HCC) 07/02/2019   Polyneuropathy associated with underlying disease (HCC) 11/28/2018   Severe episode of recurrent major depressive disorder, without psychotic features (HCC) 11/28/2018   Type 2 diabetes mellitus with diabetic peripheral angiopathy without gangrene, without long-term current use of insulin  (HCC) 11/28/2018   Poor short term memory 11/28/2018   Moderate aortic stenosis by prior echocardiogram 11/06/2018   Diabetes mellitus (HCC) 03/21/2017   Acute renal failure superimposed on stage 2 chronic kidney disease (HCC) 03/17/2016   Symptomatic anemia 03/17/2016   S/P total knee arthroplasty, right 03/17/2016   Unilateral primary osteoarthritis, right knee    Hyperlipidemia associated with type 2 diabetes mellitus (HCC) 12/15/2015   Pain in both lower extremities 11/19/2015   Numbness in both hands 08/13/2015   Panic attack 06/09/2015   Right knee pain 05/14/2015   Chronic renal insufficiency 09/16/2014    Pneumothorax on right    Atypical mycobacterium infection    ILD (interstitial lung disease) 2/2 MAI s/p med rxn    Class 3 severe obesity with serious comorbidity and body mass index (BMI) of 50.0 to 59.9 in adult (HCC) 04/03/2014   OSA (obstructive sleep apnea) 02/25/2014   Hypertensive heart disease with chronic diastolic congestive heart failure (HCC) 02/25/2014   DOE (dyspnea on exertion) 02/25/2014   Essential hypertension 02/25/2014    PCP: Christianna Cowman  REFERRING PROVIDER: Raydell Cahill  REFERRING DIAG:  [phone] (ICD-10-CM) - Cervical radiculopathy    THERAPY DIAG:  Cervicalgia  Acute pain of right shoulder  Muscle weakness (generalized)  Radiculopathy, cervical region  Rationale for Evaluation and Treatment: Rehabilitation  ONSET DATE: "a couple of weeks"  SUBJECTIVE:  SUBJECTIVE STATEMENT:  "Great". I have been doing my HEP and it is helping Hand dominance: Right  PERTINENT HISTORY:  Patient is a very pleasant right-hand-dominant 73 year old female that presents today with a couple of weeks of right arm pain.  Her pain is most noticeable when she holds the arm in a dependent position by her side.  Pain is alleviated when raising the arm directly overhead.  She describes it as a tingling type of sensation that localizes itself to the upper arm.  No pain past the elbow.  She denies any weakness in the arm.  No similar issues in the past. Patient will start physical therapy.  She takes 100 mg of gabapentin  twice daily for neuropathy and I recommended that she increase that to 100 mg 3 times daily.  She will discuss that with her PCP.  If symptoms persist or worsen then consider imaging at that time including x-ray and MRI.  Follow-up for ongoing or recalcitrant  issues.   PAIN:  Are you having pain? Yes: NPRS scale: 6/10 Pain location: L knee Pain description: sharp then goes numb  Aggravating factors: laying down, household chores, getting dressed  Relieving factors: PT   PRECAUTIONS: None  RED FLAGS: None    WEIGHT BEARING RESTRICTIONS: No  FALLS:  Has patient fallen in last 6 months? Yes. Number of falls I had 1, I passed out this Saturday after I was sick and vomiting   LIVING ENVIRONMENT: Lives with: lives with their family Lives in: House/apartment Stairs: No Has following equipment at home: Environmental consultant - 2 wheeled  OCCUPATION: retired   PLOF: Independent  PATIENT GOALS: I want to release this as soon as I can   NEXT MD VISIT: have not scheduled yet  OBJECTIVE:  Note: Objective measures were completed at Evaluation unless otherwise noted.  DIAGNOSTIC FINDINGS:  N/A  COGNITION: Overall cognitive status: Within functional limits for tasks assessed  SENSATION: WFL  POSTURE: rounded shoulders and forward head  PALPATION: TTP and trigger points noted in R UT    CERVICAL ROM:   Active ROM A/PROM (deg) eval AROM 05/15/23 AROM 06/05/23  Flexion WNL WNL WNL   Extension WNL pain at end range but nothing severe WNL no pain just a pull WNL no pain but feels a pull  Right lateral flexion 75% Limited 25% WNL- feels a pull  Left lateral flexion 75% pain in R UT Limited 25% WNL-feels a pull  Right rotation WNL WNL WNL  Left rotation WNL WNL WNL but painful   (Blank rows = not tested)  UPPER EXTREMITY ROM: good overall ROM within functional limits    UPPER EXTREMITY MMT: grossly 4+/5 BUE, shoulder abd 3+ BUE with pain    CERVICAL SPECIAL TESTS:  Upper limb tension test (ULTT): Negative, Spurling's test: Positive, and Distraction test: Positive   TREATMENT DATE:  06/05/23 UBE L1.5  Bike L1.5 L Knee PROM S2S yellow ball OH press 2x10 Yellow ball seated chest press 2x10 Chin tucks 2x10 -needed tactile cue  to help with  Cable row 10# 2x12 Red band pull apart 2x12 Cervical stretching all directions    05/30/23 NuStep L 5 x 7 min Shoulder Ext 10lb 2x10 Standing Rows 10lb 2x10  Seated OHP 3lb dumbbell 2x10 Chin tucks 2x10 Horiz abd green 2x10  05/18/23 NuStep L 5 x 6 min Rows & Ext green 2x10 Seated OHP 3lb dumbbell 2x10 Horiz abd green 2x10 Chin tucks 2x10 Backward shoulder rolls 2x10 Shoulder Flex 3lb 2x10  Shoulder abd 2lb 2x10  05/16/22 NuStep L 5 x 6 min Horiz abd green 2x10 Shoulder Ext 5lb 2x10 Shoulder ER red 2x10 Chin tucks 2x10 Shoulder abd 2# 2x10  Backward shoulder rolls x15 . 20lb triceps Ext  05/15/23 GOALS UBE L2 x3 min forward/3 min backward  Seated Rows & Lats 20lb 2x10 Chest press 5lb 2x10 Chin tucks 2x10 Shoulder flexion 2# 2x10  Shoulder abd 2# 2x10  Shoulder ER red 2x10  05/09/23  UBE L2 x3 min forward/3 min backward (one minute rest after changing directions)  Chin tucks x15  Chin tuck + rotation x10 B Chin tuck + lateral flexion x10 B Scap retractions x12  Backward shoulder rolls x15  Shoulder flexion 2# 2x10  Shoulder abd 2# 2x10   05/03/23 NuStep L5 x 6 min Sit to stand OHP red ball 2x8 Cervical retractions 2x10 Shoulder ER yellow 2x10 Shoulder Flex 2lb 2x10 Shoulder abd 2lb  2x10   04/26/23 AAROM Shoulder Flex, Ext, IR ups back x10 NuStep L 5 x 6 min Rows & Ext red 2x10 Shoulder ER yellow 2x10    04/20/23- EVAL                                                                                                                                 PATIENT EDUCATION:  Education details: POC and HEP Person educated: Patient Education method: Explanation Education comprehension: verbalized understanding  HOME EXERCISE PROGRAM: Access Code: [email or web address] URL: [email or web address] Date: 04/20/2023 Prepared by: Donavon Fudge  Exercises - Seated Upper Trapezius Stretch  - 1 x daily - 7 x weekly - 2 sets - 3 reps - 15 hold -  Seated Levator Scapulae Stretch  - 1 x daily - 7 x weekly - 2 sets - 3 reps - 15 hold - Seated Cervical Retraction  - 1 x daily - 7 x weekly - 2 sets - 10 reps - Doorway Pec Stretch at 90 Degrees Abduction  - 1 x daily - 7 x weekly - 2 sets - 3 reps - 15 hold  ASSESSMENT:  CLINICAL IMPRESSION:  Pt arrives today feeling great. Some L knee pain with WB. Continued working on postural training and strengthening today as tolerated. Tactiel cues needed to prevent cervical and lumbar extension with chin tucks. She is very deconditioned and easily fatigued. Standing row was taxing on pt and needed to sit down after each set. Pain free cervical ROM has improved.    OBJECTIVE IMPAIRMENTS: decreased ROM, decreased strength, impaired flexibility, postural dysfunction, and pain.   ACTIVITY LIMITATIONS: lifting, sleeping, and dressing  PARTICIPATION LIMITATIONS: cleaning, laundry, and driving  PERSONAL FACTORS: Age, Fitness, Past/current experiences, Time since onset of injury/illness/exacerbation, and 3+ comorbidities: CHF, DM, respiratory issues, HTN  are also affecting patient's functional outcome.   REHAB POTENTIAL: Good  CLINICAL DECISION MAKING: Stable/uncomplicated  EVALUATION COMPLEXITY: Low  GOALS: Goals reviewed with patient? Yes  SHORT TERM GOALS: Target date: 05/18/23  Patient  will be independent with initial HEP.  Baseline: HEP given 04/20/23 Goal status: Met 05/15/23   LONG TERM GOALS: Target date:06/15/23  Patient will be independent with advanced/ongoing HEP to improve outcomes and carryover.  Baseline:  Goal status: INITIAL  2.  Patient will report 75% improvement in neck and upper arm pain to improve QOL.  Baseline: 10/10 esp at night Goal status: Progressing 50% better 05/15/23   3.  Patient will demonstrate full pain free cervical ROM  Baseline: see chart Goal status: Progressing 05/15/23  4.  Patient will able to sleep throughout the night without increase is pain and  waking up due to arm pain Baseline: unable to sleep at night Goal status: Met 05/15/23  PLAN:  PT FREQUENCY: 2x/week  PT DURATION: 8 weeks  PLANNED INTERVENTIONS: 97110-Therapeutic exercises, 97530- Therapeutic activity, 97112- Neuromuscular re-education, 97535- Self Care, [id number]- Manual therapy, Patient/Family education, Taping, Dry Needling, Joint mobilization, Spinal mobilization, Cryotherapy, and Moist heat  PLAN FOR NEXT SESSION: manual therapy for neck, stretching for neck, DN if she wants to try it, progress postural training   Towanda Fret, PTA  06/05/23 10:20 AM

## 2023-06-07 ENCOUNTER — Encounter: Payer: Self-pay | Admitting: Physical Therapy

## 2023-06-07 ENCOUNTER — Ambulatory Visit: Admitting: Physical Therapy

## 2023-06-07 DIAGNOSIS — M5412 Radiculopathy, cervical region: Secondary | ICD-10-CM | POA: Diagnosis not present

## 2023-06-07 DIAGNOSIS — M25511 Pain in right shoulder: Secondary | ICD-10-CM

## 2023-06-07 DIAGNOSIS — M6281 Muscle weakness (generalized): Secondary | ICD-10-CM

## 2023-06-07 DIAGNOSIS — M542 Cervicalgia: Secondary | ICD-10-CM

## 2023-06-07 NOTE — Therapy (Addendum)
 OUTPATIENT PHYSICAL THERAPY CERVICAL TREATMENT   Patient Name: Stacey Page MRN: [id number] DOB:Mar 19, 1950, 73 y.o., female Today's Date: 06/07/2023  END OF SESSION:  PT End of Session - 06/07/23 0926     Visit Number 10    Date for PT Re-Evaluation 06/15/23    PT Start Time 0930    PT Stop Time 1015    PT Time Calculation (min) 45 min    Activity Tolerance Patient tolerated treatment well    Behavior During Therapy [facility] for tasks assessed/performed              Past Medical History:  Diagnosis Date   Anxiety    doesn't take any meds   Arthritis of right knee 03/14/2016   Asthma    Back pain    Chronic diastolic CHF (congestive heart failure) (HCC)    HF with Preserved EF (60-65%) - Grade II Diastolic Dysfunction (Hypertensive Heart Disease). takes Furosemide  daily   Depression    doesn't take meds   Diabetes (HCC)    takes Januvia  daily   Eczema    uses cream as needed   GERD (gastroesophageal reflux disease)    History of bronchitis as a child    HTN (hypertension)    Insomnia    Moderate aortic stenosis by prior echocardiogram 04/2017   10/15/2021: [place] calcification with moderate AS, MG 23 mmHg   Mycobacterium avium-intracellulare infection (HCC) 2016   OA (osteoarthritis)    Obese    OSA (obstructive sleep apnea) 02/25/2014   wears CPAP at night   Peripheral neuropathy    takes Gabapentin  as needed   Pneumonia    hx of-2010   Seasonal allergies    uses Flonase  daily   Sleep apnea    Past Surgical History:  Procedure Laterality Date   BREAST BIOPSY Right 03/2018   BREAST LUMPECTOMY WITH RADIOACTIVE SEED LOCALIZATION Right 12/28/2018   Procedure: RIGHT BREAST LUMPECTOMY WITH RADIOACTIVE SEED LOCALIZATION;  Surgeon: Caralyn Chandler, MD;  Location: [facility] OR;  Service: General;  Laterality: Right;   BREAST SURGERY     CARDIOPULMONARY EXERCISE TEST (CPX)  06/21/2021   (Performed on Coreg  25 mg twice daily): PFTs: FVC 1.76 (60%), FEV1 1.59 (77%),  ratio 114% => RESTRICTIVE PHYSIOLOGY; 5 min- 1 mph - 2% grade. 7/10 dyspnea -> pulse ox range during exercise 95 to 98%, low of 90%, 97% on recovery; BP 106/84-160/64; heart rate 64-90 bpm (60% MPHR) -> Chronotropic Incompetence;;MAIN FACTOR for Exercise Limitation = BODY HABITUS w/ Chronotropic Incompetence.   CESAREAN SECTION  x2   COLONOSCOPY     CORONARY CALCIUM  SCORE & CT ANGIOGRAM  09/2017   Coronary Ca Score = 11 (low).  CTA- no obstructive CAD (minimal disease)   JOINT REPLACEMENT     KNEE ARTHROSCOPY Right    LUNG BIOPSY Right 06/10/2014   Procedure: LUNG BIOPSY;  Surgeon: Heriberto London, MD;  Location: [facility] OR;  Service: Thoracic;  Laterality: Right;   POLYPECTOMY     throat   RIGHT HEART CATH N/A 10/15/2021   Procedure: RIGHT HEART CATH;  Surgeon: Arty Binning, MD;  Location: [facility] INVASIVE CV LAB;  Service: CV: Mild Pulmonary Hypertension (WHO Group 3).  Mean PAP 28 mmHg, PCWP 13 mmHg.  PVR 2.73 [place].  RAP 6 mmHg.CO-CI 5.5L/min, 2.17 L/min/m.   TOTAL KNEE ARTHROPLASTY Right 03/14/2016   Procedure: RIGHT TOTAL KNEE ARTHROPLASTY;  Surgeon: Adah Acron, MD;  Location: [facility] OR;  Service: Orthopedics;  Laterality: Right;  TRANSTHORACIC ECHOCARDIOGRAM  10/10/2021   EF 70 to 75%.  Hyperdynamic with no RWMA.  Moderate LVH.  Moderate LA dilation indicating likely diastolic dysfunction although not interpretable.  Moderately elevated PAP with normal RV function, dilated IVC with elevated RAP estimated at 15 mmHg..  Moderate MAC with no MR.  AOV calcification with Moderate AS-mean AvG 23 mmHg.   TRANSTHORACIC ECHOCARDIOGRAM  06/14/2021   EF 60 to 65%.  No RWMA.  Moderate cLVH with GR 1 DD.  Mild LA dilation.  Mild to moderate aortic calcification/stenosis.  Mean AVG 18 mmHg.   VIDEO ASSISTED THORACOSCOPY Right 06/10/2014   Procedure: VIDEO ASSISTED THORACOSCOPY;  Surgeon: Heriberto London, MD;  Location: [facility] OR;  Service: Thoracic;  Laterality: Right;   Patient Active Problem List   Diagnosis  Date Noted   Subacromial bursitis of right shoulder joint 04/18/2023   Asthma 03/03/2023   Urinary frequency 02/03/2023   Viral upper respiratory tract infection 12/20/2022   Dysthymia 02/08/2022   Hypokalemia 01/26/2022   Mild pulmonary hypertension (HCC) 01/09/2022   Need for influenza vaccination 12/02/2021   Dysfunction of left eustachian tube 12/02/2021   Allergic rhinitis 12/02/2021   Chronic respiratory failure with hypoxia (HCC) 11/11/2021   Demand ischemia (HCC)    Respiratory acidosis    Acute respiratory failure with hypercapnia (HCC)    Acute on chronic heart failure with preserved ejection fraction (HCC)    CHF (congestive heart failure) (HCC) 10/09/2021   Reactive airway disease 09/21/2021   Viral syndrome 09/21/2021   Hypertensive retinopathy of right eye, grade 1 08/11/2021   Grade 2 hypertensive retinopathy, left 08/11/2021   Nuclear sclerotic cataract of both eyes 08/11/2021   Need for shingles vaccine 07/14/2021   Iron deficiency anemia 01/13/2021   Stage 4 chronic kidney disease (HCC) 07/09/2019   Type 2 diabetes mellitus with obesity (HCC) 07/02/2019   Polyneuropathy associated with underlying disease (HCC) 11/28/2018   Severe episode of recurrent major depressive disorder, without psychotic features (HCC) 11/28/2018   Type 2 diabetes mellitus with diabetic peripheral angiopathy without gangrene, without long-term current use of insulin  (HCC) 11/28/2018   Poor short term memory 11/28/2018   Moderate aortic stenosis by prior echocardiogram 11/06/2018   Diabetes mellitus (HCC) 03/21/2017   Acute renal failure superimposed on stage 2 chronic kidney disease (HCC) 03/17/2016   Symptomatic anemia 03/17/2016   S/P total knee arthroplasty, right 03/17/2016   Unilateral primary osteoarthritis, right knee    Hyperlipidemia associated with type 2 diabetes mellitus (HCC) 12/15/2015   Pain in both lower extremities 11/19/2015   Numbness in both hands 08/13/2015   Panic  attack 06/09/2015   Right knee pain 05/14/2015   Chronic renal insufficiency 09/16/2014   Pneumothorax on right    Atypical mycobacterium infection    ILD (interstitial lung disease) 2/2 MAI s/p med rxn    Class 3 severe obesity with serious comorbidity and body mass index (BMI) of 50.0 to 59.9 in adult (HCC) 04/03/2014   OSA (obstructive sleep apnea) 02/25/2014   Hypertensive heart disease with chronic diastolic congestive heart failure (HCC) 02/25/2014   DOE (dyspnea on exertion) 02/25/2014   Essential hypertension 02/25/2014    PCP: Christianna Cowman  REFERRING PROVIDER: Raydell Cahill  REFERRING DIAG:  [phone] (ICD-10-CM) - Cervical radiculopathy    THERAPY DIAG:  Cervicalgia  Acute pain of right shoulder  Radiculopathy, cervical region  Muscle weakness (generalized)  Rationale for Evaluation and Treatment: Rehabilitation  ONSET DATE: "a couple of weeks"  SUBJECTIVE:  SUBJECTIVE STATEMENT: "Great" Hand dominance: Right  PERTINENT HISTORY:  Patient is a very pleasant right-hand-dominant 73 year old female that presents today with a couple of weeks of right arm pain.  Her pain is most noticeable when she holds the arm in a dependent position by her side.  Pain is alleviated when raising the arm directly overhead.  She describes it as a tingling type of sensation that localizes itself to the upper arm.  No pain past the elbow.  She denies any weakness in the arm.  No similar issues in the past. Patient will start physical therapy.  She takes 100 mg of gabapentin  twice daily for neuropathy and I recommended that she increase that to 100 mg 3 times daily.  She will discuss that with her PCP.  If symptoms persist or worsen then consider imaging at that time including x-ray and MRI.   Follow-up for ongoing or recalcitrant issues.   PAIN:  Are you having pain? Yes: NPRS scale: 2/10 Pain location: L knee Pain description: sharp then goes numb  Aggravating factors: laying down, household chores, getting dressed  Relieving factors: PT   PRECAUTIONS: None  RED FLAGS: None    WEIGHT BEARING RESTRICTIONS: No  FALLS:  Has patient fallen in last 6 months? Yes. Number of falls I had 1, I passed out this Saturday after I was sick and vomiting   LIVING ENVIRONMENT: Lives with: lives with their family Lives in: House/apartment Stairs: No Has following equipment at home: Environmental consultant - 2 wheeled  OCCUPATION: retired   PLOF: Independent  PATIENT GOALS: I want to release this as soon as I can   NEXT MD VISIT: have not scheduled yet  OBJECTIVE:  Note: Objective measures were completed at Evaluation unless otherwise noted.  DIAGNOSTIC FINDINGS:  N/A  COGNITION: Overall cognitive status: Within functional limits for tasks assessed  SENSATION: WFL  POSTURE: rounded shoulders and forward head  PALPATION: TTP and trigger points noted in R UT    CERVICAL ROM:   Active ROM A/PROM (deg) eval AROM 05/15/23 AROM 06/05/23 06/07/23  Flexion WNL WNL WNL  WNL  Extension WNL pain at end range but nothing severe WNL no pain just a pull WNL no pain but feels a pull WNL  Right lateral flexion 75% Limited 25% WNL- feels a pull WNL  Left lateral flexion 75% pain in R UT Limited 25% WNL-feels a pull WNL  Right rotation WNL WNL WNL WNL  Left rotation WNL WNL WNL but painful WNL   (Blank rows = not tested)  UPPER EXTREMITY ROM: good overall ROM within functional limits    UPPER EXTREMITY MMT: grossly 4+/5 BUE, shoulder abd 3+ BUE with pain    CERVICAL SPECIAL TESTS:  Upper limb tension test (ULTT): Negative, Spurling's test: Positive, and Distraction test: Positive   TREATMENT DATE:  06/07/23 NuStep L 5 x6 min GOALS  06/05/23 UBE L1.5  Bike L1.5 L Knee  PROM S2S yellow ball OH press 2x10 Yellow ball seated chest press 2x10 Chin tucks 2x10 -needed tactile cue to help with  Cable row 10# 2x12 Red band pull apart 2x12 Cervical stretching all directions    05/30/23 NuStep L 5 x 7 min Shoulder Ext 10lb 2x10 Standing Rows 10lb 2x10  Seated OHP 3lb dumbbell 2x10 Chin tucks 2x10 Horiz abd green 2x10  05/18/23 NuStep L 5 x 6 min Rows & Ext green 2x10 Seated OHP 3lb dumbbell 2x10 Horiz abd green 2x10 Chin tucks 2x10 Backward shoulder rolls 2x10  Shoulder Flex 3lb 2x10 Shoulder abd 2lb 2x10  05/16/22 NuStep L 5 x 6 min Horiz abd green 2x10 Shoulder Ext 5lb 2x10 Shoulder ER red 2x10 Chin tucks 2x10 Shoulder abd 2# 2x10  Backward shoulder rolls x15 . 20lb triceps Ext  05/15/23 GOALS UBE L2 x3 min forward/3 min backward  Seated Rows & Lats 20lb 2x10 Chest press 5lb 2x10 Chin tucks 2x10 Shoulder flexion 2# 2x10  Shoulder abd 2# 2x10  Shoulder ER red 2x10   PATIENT EDUCATION:  Education details: POC and HEP Person educated: Patient Education method: Explanation Education comprehension: verbalized understanding  HOME EXERCISE PROGRAM: Access Code: [email or web address] URL: [email or web address] Date: 04/20/2023 Prepared by: Donavon Fudge  Exercises - Seated Upper Trapezius Stretch  - 1 x daily - 7 x weekly - 2 sets - 3 reps - 15 hold - Seated Levator Scapulae Stretch  - 1 x daily - 7 x weekly - 2 sets - 3 reps - 15 hold - Seated Cervical Retraction  - 1 x daily - 7 x weekly - 2 sets - 10 reps - Doorway Pec Stretch at 90 Degrees Abduction  - 1 x daily - 7 x weekly - 2 sets - 3 reps - 15 hold  ASSESSMENT:  CLINICAL IMPRESSION:  Pt arrives today feeling great requesting a shorter session time. Goals Met and pt reports being pleased with her current functional status and reports no limitations.   OBJECTIVE IMPAIRMENTS: decreased ROM, decreased strength, impaired flexibility, postural dysfunction, and pain.   ACTIVITY  LIMITATIONS: lifting, sleeping, and dressing  PARTICIPATION LIMITATIONS: cleaning, laundry, and driving  PERSONAL FACTORS: Age, Fitness, Past/current experiences, Time since onset of injury/illness/exacerbation, and 3+ comorbidities: CHF, DM, respiratory issues, HTN  are also affecting patient's functional outcome.   REHAB POTENTIAL: Good  CLINICAL DECISION MAKING: Stable/uncomplicated  EVALUATION COMPLEXITY: Low  GOALS: Goals reviewed with patient? Yes  SHORT TERM GOALS: Target date: 05/18/23  Patient will be independent with initial HEP.  Baseline: HEP given 04/20/23 Goal status: Met 05/15/23   LONG TERM GOALS: Target date:06/15/23  Patient will be independent with advanced/ongoing HEP to improve outcomes and carryover.  Baseline:  Goal status: Met 06/07/23  2.  Patient will report 75% improvement in neck and upper arm pain to improve QOL.  Baseline: 10/10 esp at night Goal status: Progressing 50% better 05/15/23, 90% 06/07/23   3.  Patient will demonstrate full pain free cervical ROM  Baseline: see chart Goal status: Progressing 05/15/23, Met 06/07/23  4.  Patient will able to sleep throughout the night without increase is pain and waking up due to arm pain Baseline: unable to sleep at night Goal status: Met 05/15/23  PLAN:  PT FREQUENCY: 2x/week  PT DURATION: 8 weeks  PLANNED INTERVENTIONS: 97110-Therapeutic exercises, 97530- Therapeutic activity, 97112- Neuromuscular re-education, 97535- Self Care, 54098- Manual therapy, Patient/Family education, Taping, Dry Needling, Joint mobilization, Spinal mobilization, Cryotherapy, and Moist heat  PLAN FOR NEXT SESSION: D/C PT   PHYSICAL THERAPY DISCHARGE SUMMARY  Visits from Start of Care: 10   Patient agrees to discharge. Patient goals were met. Patient is being discharged due to meeting the stated rehab goals.  Donavon Fudge, PT, DPT [place]anda Fret, PTA  06/07/23 9:29 AM

## 2023-06-11 ENCOUNTER — Other Ambulatory Visit: Payer: Self-pay | Admitting: Family Medicine

## 2023-06-11 DIAGNOSIS — J309 Allergic rhinitis, unspecified: Secondary | ICD-10-CM

## 2023-06-12 ENCOUNTER — Other Ambulatory Visit: Payer: Self-pay

## 2023-06-12 ENCOUNTER — Telehealth: Payer: Self-pay

## 2023-06-12 ENCOUNTER — Other Ambulatory Visit (HOSPITAL_COMMUNITY): Payer: Self-pay

## 2023-06-12 DIAGNOSIS — N184 Chronic kidney disease, stage 4 (severe): Secondary | ICD-10-CM | POA: Diagnosis not present

## 2023-06-12 MED ORDER — FLUTICASONE PROPIONATE 50 MCG/ACT NA SUSP
1.0000 | Freq: Every day | NASAL | 1 refills | Status: DC
  Filled 2023-06-12: qty 16, 60d supply, fill #0
  Filled 2023-11-30 (×2): qty 16, 60d supply, fill #1
  Filled 2023-12-20: qty 16, 60d supply, fill #2

## 2023-06-12 MED ORDER — PANTOPRAZOLE SODIUM 40 MG PO TBEC
40.0000 mg | DELAYED_RELEASE_TABLET | Freq: Every day | ORAL | 1 refills | Status: DC
  Filled 2023-06-12 – 2023-09-02 (×2): qty 90, 90d supply, fill #0
  Filled 2023-10-05 – 2023-12-02 (×2): qty 90, 90d supply, fill #1

## 2023-06-12 NOTE — Progress Notes (Signed)
 Complex Care Management Care Guide Note  06/12/2023 Name: [name] MRN: [id number] DOB: 12-13-1950  Stacey Page is a 73 y.o. year old female who is a primary care patient of Tonna Frederic, MD and is actively engaged with the care management team. I reached out to Sunday Eng by phone today to assist with re-scheduling  with the RN Case Manager.  Follow up plan: The care guide will reach out to the patient again over the next 7 days.  [facility]  [facility], [facility] Health Care Management Assistant Direct Dial: [phone]  Fax: [phone]

## 2023-06-13 ENCOUNTER — Ambulatory Visit: Admitting: Physical Therapy

## 2023-06-13 ENCOUNTER — Other Ambulatory Visit (HOSPITAL_COMMUNITY): Payer: Self-pay

## 2023-06-13 ENCOUNTER — Encounter (INDEPENDENT_AMBULATORY_CARE_PROVIDER_SITE_OTHER): Payer: Self-pay | Admitting: Adult Health

## 2023-06-13 ENCOUNTER — Ambulatory Visit (INDEPENDENT_AMBULATORY_CARE_PROVIDER_SITE_OTHER): Admitting: Adult Health

## 2023-06-13 ENCOUNTER — Telehealth: Payer: Self-pay

## 2023-06-13 ENCOUNTER — Other Ambulatory Visit: Payer: Self-pay

## 2023-06-13 VITALS — BP 131/48 | HR 65 | Temp 97.8°F | Ht 68.0 in | Wt 314.0 lb

## 2023-06-13 DIAGNOSIS — E1169 Type 2 diabetes mellitus with other specified complication: Secondary | ICD-10-CM

## 2023-06-13 DIAGNOSIS — Z7985 Long-term (current) use of injectable non-insulin antidiabetic drugs: Secondary | ICD-10-CM

## 2023-06-13 DIAGNOSIS — Z6841 Body Mass Index (BMI) 40.0 and over, adult: Secondary | ICD-10-CM

## 2023-06-13 DIAGNOSIS — E1159 Type 2 diabetes mellitus with other circulatory complications: Secondary | ICD-10-CM | POA: Diagnosis not present

## 2023-06-13 DIAGNOSIS — I152 Hypertension secondary to endocrine disorders: Secondary | ICD-10-CM

## 2023-06-13 DIAGNOSIS — E669 Obesity, unspecified: Secondary | ICD-10-CM | POA: Diagnosis not present

## 2023-06-13 DIAGNOSIS — E1122 Type 2 diabetes mellitus with diabetic chronic kidney disease: Secondary | ICD-10-CM

## 2023-06-13 DIAGNOSIS — N184 Chronic kidney disease, stage 4 (severe): Secondary | ICD-10-CM

## 2023-06-13 NOTE — Progress Notes (Signed)
 WEIGHT SUMMARY AND BIOMETRICS  Vitals Temp: 97.8 F (36.6 C) BP: (!) 131/48 Pulse Rate: 65 SpO2: 95 %   Anthropometric Measurements Height: 5\' 8"  (1.727 m) Weight: (!) 314 lb (142.4 kg) BMI (Calculated): 47.75 Weight at Last Visit: 315 lb Weight Lost Since Last Visit: 1 lb Weight Gained Since Last Visit: 0 Starting Weight: 344 lb Total Weight Loss (lbs): 30 lb (13.6 kg)   Body Composition  Body Fat %: 55.1 % Fat Mass (lbs): 173 lbs Muscle Mass (lbs): 134 lbs Total Body Water  (lbs): 102.8 lbs Visceral Fat Rating : 22   Other Clinical Data Fasting: no Labs: no Today's Visit #: 62 Starting Date: 03/25/19    Chief Complaint:   OBESITY Stacey Page is here to discuss her progress with her obesity treatment plan.  She is on the the Category 2 Plan and states she is following her eating plan approximately 50 % of the time.  She states she is exercising Chair Exercises 10 minutes 3 times per week.  Interim History:  Stacey Page is thrilled to be selling her home and eventually move in with her adult daughter! She is not wearing her supplemental O2 at OV today She reports needing her supplemental O2 less when running errands. She denies tobacco/cape use  Subjective:   1. Hypertension associated with diabetes (HCC) Discussed Labs 05/02/2023 CMP: Electrolytes stable, liver enzymes stable Kidney fx slightly improved  2. Type 2 diabetes mellitus with other specified complication, without long-term current use of insulin  (HCC) Discussed Labs  Latest Reference Range & Units 05/02/23 09:20  Glucose 70 - 99 mg/dL 87  Hemoglobin Z6X 4.8 - 5.6 % 5.7 (H)  Est. average glucose Bld gHb Est-mCnc mg/dL 096  INSULIN  2.6 - 24.9 uIU/mL 6.9  (H): Data is abnormally high  CBG, A1c, at goal Insulin  level only slightly above goal of 5  Home Fasting CBG - not checking She denies sx's of hypoglycemia She stared on loading dose Ozempic  0.25mg  once weekly injection on/about  10/13/2022 Ozempic  increased to 0.5mg   on 11/10/2022 Ozempic  increased from 0.5mg  to 1mg  on/about 02/28/2023 She takes injection on Thursday Denies mass in neck, dysphagia, dyspepsia, persistent hoarseness, abdominal pain, or N/V/C   She was approved for pt assistance via [employer]- recently received 4 mont supply of Ozempic  1mg   3. Stage 4 chronic kidney disease (HCC) Discussed Labs  Latest Reference Range & Units 05/02/23 09:20  Creatinine 0.57 - 1.00 mg/dL 0.45 (H)  Calcium  8.7 - 10.3 mg/dL 9.6  BUN/Creatinine Ratio 12 - 28  8 (L)  eGFR >59 mL/min/1.73 26 (L)  (H): Data is abnormally high (L): Data is abnormally low  CKD stable and slightly improved  Assessment/Plan:   1. Hypertension associated with diabetes (HCC) Limit Na+ Remain active Continue  aspirin  81 MG tablet  losartan  (COZAAR ) 25 MG tablet  isosorbide  dinitrate (ISORDIL ) 10 MG tablet  hydrALAZINE  (APRESOLINE ) 50 MG tablet  atorvastatin  (LIPITOR) 20 MG tablet  carvedilol  (COREG ) 25 MG tablet  furosemide  (LASIX ) 80 MG tablet   2. Type 2 diabetes mellitus with other specified complication, without long-term current use of insulin  (HCC) (Primary) Continue Cat 2 MP and regular exercise Continue weekly Ozempic - does not require refill   3. Stage 4 chronic kidney disease (HCC) Continue to avoid Nephrotoxic susbtances Hydrate per Nephrology  4. Current BMI 47.7  Stacey Page is currently in the action stage of change. As such, her goal is to continue with weight loss efforts. She has agreed to  the Category 2 Plan.   Exercise goals: Older adults should follow the adult guidelines. When older adults cannot meet the adult guidelines, they should be as physically active as their abilities and conditions will allow.  Older adults should do exercises that maintain or improve balance if they are at risk of falling.  Older adults should determine their level of effort for physical activity relative to their level of fitness.   Older adults with chronic conditions should understand whether and how their conditions affect their ability to do regular physical activity safely. Chair Exercises  Behavioral modification strategies: increasing lean protein intake, decreasing simple carbohydrates, increasing vegetables, no skipping meals, meal planning and cooking strategies, keeping healthy foods in the home, and planning for success.  Stacey Page has agreed to follow-up with our clinic in 4 weeks. She was informed of the importance of frequent follow-up visits to maximize her success with intensive lifestyle modifications for her multiple health conditions.   Objective:   Blood pressure (!) 131/48, pulse 65, temperature 97.8 F (36.6 C), height 5\' 8"  (1.727 m), weight (!) 314 lb (142.4 kg), SpO2 95%. Body mass index is 47.74 kg/m.  General: Cooperative, alert, well developed, in no acute distress. HEENT: Conjunctivae and lids unremarkable. Cardiovascular: Regular rhythm.  Lungs: Normal work of breathing. Neurologic: No focal deficits.   Lab Results  Component Value Date   CREATININE 2.01 (H) 05/02/2023   BUN 17 05/02/2023   NA 139 05/02/2023   K 4.3 05/02/2023   CL 98 05/02/2023   CO2 27 05/02/2023   Lab Results  Component Value Date   ALT 11 05/02/2023   AST 11 05/02/2023   ALKPHOS 86 05/02/2023   BILITOT 0.4 05/02/2023   Lab Results  Component Value Date   HGBA1C 5.7 (H) 05/02/2023   HGBA1C 6.0 04/18/2023   HGBA1C 6.2 (H) 12/12/2022   HGBA1C 6.5 07/14/2022   HGBA1C 6.5 04/21/2022   Lab Results  Component Value Date   INSULIN  6.9 05/02/2023   INSULIN  10.8 12/12/2022   INSULIN  8.6 10/28/2019   INSULIN  9.2 07/04/2019   INSULIN  8.8 03/25/2019   Lab Results  Component Value Date   TSH 0.51 11/28/2018   Lab Results  Component Value Date   CHOL 144 12/12/2022   HDL 63 12/12/2022   LDLCALC 68 12/12/2022   TRIG 62 12/12/2022   CHOLHDL 2.3 12/12/2022   Lab Results  Component Value Date    VD25OH 41.7 05/02/2023   VD25OH 36.8 12/12/2022   VD25OH 57.6 10/28/2019   Lab Results  Component Value Date   WBC 4.3 02/03/2023   HGB 13.5 02/03/2023   HCT 42.3 02/03/2023   MCV 92.6 02/03/2023   PLT 206 02/03/2023   Lab Results  Component Value Date   IRON 51 02/03/2023   TIBC 266 02/03/2023   FERRITIN 70 02/03/2023    Attestation Statements:   Reviewed by clinician on day of visit: allergies, medications, problem list, medical history, surgical history, family history, social history, and previous encounter notes.  I have reviewed the above documentation for accuracy and completeness, and I agree with the above. -  Ac Colan d. Tania Perrott, NP-C

## 2023-06-13 NOTE — Progress Notes (Signed)
 Complex Care Management Note  Care Guide Note 06/13/2023 Name: Stacey Page MRN: [id number] DOB: 1950/02/19  [name] is a 73 y.o. year old female who sees Tonna Frederic, MD for primary care. I reached out to Sunday Eng by phone today to offer complex care management services.  [name] was given information about Complex Care Management services today including:   The Complex Care Management services include support from the care team which includes your Nurse Care Manager, Clinical Social Worker, or Pharmacist.  The Complex Care Management team is here to help remove barriers to the health concerns and goals most important to you. Complex Care Management services are voluntary, and the patient may decline or stop services at any time by request to their care team member.   Complex Care Management Consent Status: Patient agreed to services and verbal consent obtained.   Follow up plan:  Telephone appointment with complex care management team member scheduled for:  06/22/23 at 3:00 p.m.   Encounter Outcome:  Patient Scheduled  [facility]  [facility], [facility] Health Care Management Assistant Direct Dial: [phone]  Fax: [phone]

## 2023-06-17 ENCOUNTER — Other Ambulatory Visit: Payer: Self-pay | Admitting: Family Medicine

## 2023-06-17 DIAGNOSIS — J309 Allergic rhinitis, unspecified: Secondary | ICD-10-CM

## 2023-06-18 ENCOUNTER — Other Ambulatory Visit (HOSPITAL_COMMUNITY): Payer: Self-pay

## 2023-06-19 ENCOUNTER — Other Ambulatory Visit: Payer: Self-pay

## 2023-06-19 ENCOUNTER — Other Ambulatory Visit (HOSPITAL_COMMUNITY): Payer: Self-pay

## 2023-06-19 MED ORDER — MONTELUKAST SODIUM 10 MG PO TABS
10.0000 mg | ORAL_TABLET | Freq: Every day | ORAL | 3 refills | Status: AC
  Filled 2023-06-19: qty 90, 90d supply, fill #0
  Filled 2023-10-05: qty 90, 90d supply, fill #1
  Filled 2023-12-30 – 2024-01-01 (×2): qty 90, 90d supply, fill #2

## 2023-06-20 DIAGNOSIS — I509 Heart failure, unspecified: Secondary | ICD-10-CM | POA: Diagnosis not present

## 2023-06-20 DIAGNOSIS — J9610 Chronic respiratory failure, unspecified whether with hypoxia or hypercapnia: Secondary | ICD-10-CM | POA: Diagnosis not present

## 2023-06-20 DIAGNOSIS — I272 Pulmonary hypertension, unspecified: Secondary | ICD-10-CM | POA: Diagnosis not present

## 2023-06-21 DIAGNOSIS — E1122 Type 2 diabetes mellitus with diabetic chronic kidney disease: Secondary | ICD-10-CM | POA: Diagnosis not present

## 2023-06-21 DIAGNOSIS — E1129 Type 2 diabetes mellitus with other diabetic kidney complication: Secondary | ICD-10-CM | POA: Diagnosis not present

## 2023-06-21 DIAGNOSIS — D631 Anemia in chronic kidney disease: Secondary | ICD-10-CM | POA: Diagnosis not present

## 2023-06-21 DIAGNOSIS — N2581 Secondary hyperparathyroidism of renal origin: Secondary | ICD-10-CM | POA: Diagnosis not present

## 2023-06-21 DIAGNOSIS — I5032 Chronic diastolic (congestive) heart failure: Secondary | ICD-10-CM | POA: Diagnosis not present

## 2023-06-21 DIAGNOSIS — N184 Chronic kidney disease, stage 4 (severe): Secondary | ICD-10-CM | POA: Diagnosis not present

## 2023-06-21 DIAGNOSIS — I129 Hypertensive chronic kidney disease with stage 1 through stage 4 chronic kidney disease, or unspecified chronic kidney disease: Secondary | ICD-10-CM | POA: Diagnosis not present

## 2023-06-21 DIAGNOSIS — R809 Proteinuria, unspecified: Secondary | ICD-10-CM | POA: Diagnosis not present

## 2023-06-22 ENCOUNTER — Other Ambulatory Visit: Payer: Self-pay

## 2023-06-25 DIAGNOSIS — I272 Pulmonary hypertension, unspecified: Secondary | ICD-10-CM | POA: Diagnosis not present

## 2023-06-25 DIAGNOSIS — I509 Heart failure, unspecified: Secondary | ICD-10-CM | POA: Diagnosis not present

## 2023-06-25 DIAGNOSIS — J9610 Chronic respiratory failure, unspecified whether with hypoxia or hypercapnia: Secondary | ICD-10-CM | POA: Diagnosis not present

## 2023-06-28 DIAGNOSIS — E1142 Type 2 diabetes mellitus with diabetic polyneuropathy: Secondary | ICD-10-CM | POA: Diagnosis not present

## 2023-06-28 DIAGNOSIS — M792 Neuralgia and neuritis, unspecified: Secondary | ICD-10-CM | POA: Diagnosis not present

## 2023-06-28 DIAGNOSIS — L600 Ingrowing nail: Secondary | ICD-10-CM | POA: Diagnosis not present

## 2023-06-28 DIAGNOSIS — B351 Tinea unguium: Secondary | ICD-10-CM | POA: Diagnosis not present

## 2023-06-28 DIAGNOSIS — I739 Peripheral vascular disease, unspecified: Secondary | ICD-10-CM | POA: Diagnosis not present

## 2023-06-28 DIAGNOSIS — M2041 Other hammer toe(s) (acquired), right foot: Secondary | ICD-10-CM | POA: Diagnosis not present

## 2023-07-08 ENCOUNTER — Other Ambulatory Visit (HOSPITAL_COMMUNITY): Payer: Self-pay

## 2023-07-10 ENCOUNTER — Other Ambulatory Visit (HOSPITAL_COMMUNITY): Payer: Self-pay

## 2023-07-10 ENCOUNTER — Telehealth: Payer: Self-pay

## 2023-07-10 ENCOUNTER — Ambulatory Visit (INDEPENDENT_AMBULATORY_CARE_PROVIDER_SITE_OTHER): Admitting: Family Medicine

## 2023-07-10 ENCOUNTER — Other Ambulatory Visit: Payer: Self-pay

## 2023-07-10 ENCOUNTER — Encounter: Payer: Self-pay | Admitting: Family Medicine

## 2023-07-10 VITALS — BP 138/88 | HR 61 | Temp 97.4°F | Ht 68.0 in | Wt 315.8 lb

## 2023-07-10 DIAGNOSIS — E1169 Type 2 diabetes mellitus with other specified complication: Secondary | ICD-10-CM | POA: Diagnosis not present

## 2023-07-10 DIAGNOSIS — Z7984 Long term (current) use of oral hypoglycemic drugs: Secondary | ICD-10-CM | POA: Diagnosis not present

## 2023-07-10 DIAGNOSIS — M79671 Pain in right foot: Secondary | ICD-10-CM | POA: Insufficient documentation

## 2023-07-10 DIAGNOSIS — Z6841 Body Mass Index (BMI) 40.0 and over, adult: Secondary | ICD-10-CM | POA: Diagnosis not present

## 2023-07-10 NOTE — Progress Notes (Signed)
 Established Patient Office Visit   Subjective:  Patient ID: Stacey Page, female    DOB: Sep 28, 1950  Age: 73 y.o. MRN: [id number]  Chief Complaint  Patient presents with   Foot Swelling    Pt complains of right foot edema. Pt states edema was present for 3-4 days. Pt states improvement.    HPI Encounter Diagnoses  Name Primary?   Type 2 diabetes mellitus with other specified complication, without long-term current use of insulin  (HCC) Yes   Morbid obesity (HCC), starting BMI 55.52    Right foot pain    Follow-up of above.  Had developed right lateral foot pain 2 days ago that has since resolved.  No injury.  There was some swelling that has resolved as well.  No family history or personal history of gout.  Diabetes is well-controlled with dapagliflozin  and semaglutide .  Continues to work with weight loss management for weight loss.   Review of Systems  Constitutional: Negative.   HENT: Negative.    Eyes:  Negative for blurred vision, discharge and redness.  Respiratory: Negative.    Cardiovascular: Negative.   Gastrointestinal:  Negative for abdominal pain.  Genitourinary: Negative.   Musculoskeletal: Negative.  Negative for myalgias.  Skin:  Negative for rash.  Neurological:  Negative for tingling, loss of consciousness and weakness.  Endo/Heme/Allergies:  Negative for polydipsia.     Current Outpatient Medications:    Accu-Chek Softclix Lancets lancets, May check glucose up to 3 times daily., Disp: 100 each, Rfl: 12   albuterol  (VENTOLIN  HFA) 108 (90 Base) MCG/ACT inhaler, INHALE 1-2 PUFFS into THE lungs EVERY 6 HOURS AS NEEDED FOR WHEEZING AND/OR SHORTNESS OF BREATH, Disp: 6.7 g, Rfl: 3   aspirin  81 MG tablet, Take 1 tablet (81 mg total) by mouth daily with breakfast., Disp: 90 tablet, Rfl: 0   atorvastatin  (LIPITOR) 20 MG tablet, Take 1 tablet (20 mg total) by mouth daily., Disp: 90 tablet, Rfl: 3   Blood Glucose Monitoring Suppl (ONETOUCH VERIO SYNC SYSTEM)  w/Device KIT, 1 kit by Does not apply route daily as needed., Disp: 1 kit, Rfl: 0   carvedilol  (COREG ) 25 MG tablet, Take 1 tablet (25 mg total) by mouth 2 (two) times daily., Disp: 180 tablet, Rfl: 3   cholecalciferol (VITAMIN D3) 25 MCG (1000 UNIT) tablet, Take 2,000 Units by mouth daily., Disp: , Rfl:    dapagliflozin  propanediol (FARXIGA ) 5 MG TABS tablet, Take 1 tablet (5 mg total) by mouth daily before breakfast., Disp: 90 tablet, Rfl: 2   Docusate Sodium  100 MG capsule, Take 100 mg by mouth 2 (two) times daily., Disp: , Rfl:    ferrous sulfate  325 (65 FE) MG tablet, Take 325 mg by mouth 2 (two) times daily with a meal., Disp: , Rfl:    fluticasone  (FLONASE ) 50 MCG/ACT nasal spray, Place 1 spray into both nostrils daily., Disp: 48 g, Rfl: 1   furosemide  (LASIX ) 80 MG tablet, Take 1 tablet (80 mg total) by mouth daily., Disp: 90 tablet, Rfl: 1   gabapentin  (NEURONTIN ) 100 MG capsule, Take 1 capsule (100 mg total) by mouth 2 (two) times daily., Disp: 180 capsule, Rfl: 1   glucose blood (ONETOUCH VERIO) test strip, Use to monitor blood sugar twice daily as instructed, Disp: 200 each, Rfl: 3   hydrALAZINE  (APRESOLINE ) 50 MG tablet, Take 1 tablet (50 mg total) by mouth every 8 (eight) hours., Disp: 90 tablet, Rfl: 3   isosorbide  dinitrate (ISORDIL ) 10 MG tablet, Take 1 tablet (10 mg  total) by mouth 3 (three) times daily. Please keep scheduled appointment, Disp: 270 tablet, Rfl: 3   losartan  (COZAAR ) 25 MG tablet, Take 0.5 tablets (12.5 mg total) by mouth daily., Disp: 45 tablet, Rfl: 3   montelukast  (SINGULAIR ) 10 MG tablet, Take 1 tablet (10 mg total) by mouth at bedtime., Disp: 90 tablet, Rfl: 3   pantoprazole  (PROTONIX ) 40 MG tablet, Take 1 tablet (40 mg total) by mouth daily., Disp: 90 tablet, Rfl: 1   potassium chloride  (KLOR-CON  M) 10 MEQ tablet, Take 1 tablet (10 mEq total) by mouth daily., Disp: 60 tablet, Rfl: 2   Semaglutide , 1 MG/DOSE, 4 MG/3ML SOPN, Inject 1 mg as directed once a week.,  Disp: 9 mL, Rfl: 0   Objective:     BP 138/88 (Cuff Size: Large)   Pulse 61   Temp (!) 97.4 F (36.3 C) (Temporal)   Ht 5\' 8"  (1.727 m)   Wt (!) 315 lb 12.8 oz (143.2 kg)   SpO2 95%   BMI 48.02 kg/m    Physical Exam Constitutional:      General: She is not in acute distress.    Appearance: Normal appearance. She is not ill-appearing, toxic-appearing or diaphoretic.  HENT:     Head: Normocephalic and atraumatic.     Right Ear: External ear normal.     Left Ear: External ear normal.     Mouth/Throat:     Mouth: Mucous membranes are moist.     Pharynx: Oropharynx is clear. No oropharyngeal exudate or posterior oropharyngeal erythema.  Eyes:     General: No scleral icterus.       Right eye: No discharge.        Left eye: No discharge.     Extraocular Movements: Extraocular movements intact.     Conjunctiva/sclera: Conjunctivae normal.     Pupils: Pupils are equal, round, and reactive to light.  Cardiovascular:     Rate and Rhythm: Normal rate and regular rhythm.     Pulses:          Dorsalis pedis pulses are 2+ on the right side and 2+ on the left side.       Posterior tibial pulses are 1+ on the right side and 1+ on the left side.  Pulmonary:     Effort: Pulmonary effort is normal. No respiratory distress.     Breath sounds: Normal breath sounds. No wheezing, rhonchi or rales.  Abdominal:     General: Bowel sounds are normal.     Tenderness: There is no abdominal tenderness. There is no guarding.  Musculoskeletal:     Cervical back: No rigidity or tenderness.     Right lower leg: No edema.     Left lower leg: No edema.  Skin:    General: Skin is warm and dry.  Neurological:     Mental Status: She is alert and oriented to person, place, and time.  Psychiatric:        Mood and Affect: Mood normal.        Behavior: Behavior normal.     Diabetic Foot Exam - Simple   Simple Foot Form Diabetic Foot exam was performed with the following findings: Yes 07/10/2023 10:01  AM  Visual Inspection No deformities, no ulcerations, no other skin breakdown bilaterally: Yes Sensation Testing Intact to touch and monofilament testing bilaterally: Yes Pulse Check Posterior Tibialis and Dorsalis pulse intact bilaterally: Yes Comments Mild xerosis bilaterally.     No results found for any visits on  07/10/23.    The 10-year ASCVD risk score (Arnett DK, et al., 2019) is: 24.3%    Assessment & Plan:   Type 2 diabetes mellitus with other specified complication, without long-term current use of insulin  (HCC)  Morbid obesity (HCC), starting BMI 55.52  Right foot pain    Return in about 6 months (around 01/09/2024), or if symptoms worsen or fail to improve.  Continue current medications.  Continue follow-up with nephrology.  Continue weight loss efforts.  Right foot exam is normal today.  Could consider plain films uric acid check if pain returns.  Tonna Frederic, MD

## 2023-07-10 NOTE — Patient Outreach (Signed)
 RNCM Telephone Encounter Note  CCM RNCM outreach for initial visit, rescheduled 2x.  Patient was referred in April for assistance in obtaining CPAP supplies.  This issue was resolved by Traci C. CCM RNCM on 05/24/23.  I placed call to patient to complete our scheduled visit for today, she was actually at PCP office for a visit due to swollen foot and was not able to spend time for a full visit and CCM Assessment.  Due to the original reason for referral being resolved, I sent a message to the PCP to ask if he wanted to submit a new referral for full CCM enrollment if indicated.  Message sent to Care Guide to close existing referral at this time.   Clarnce Crow BSN RN CCM   [facility], [facility] RN Care Manager Direct Dial: [phone] Fax: [phone]

## 2023-07-11 ENCOUNTER — Ambulatory Visit: Admitting: Family Medicine

## 2023-07-17 ENCOUNTER — Other Ambulatory Visit: Payer: Self-pay | Admitting: Family Medicine

## 2023-07-18 ENCOUNTER — Other Ambulatory Visit: Payer: Self-pay

## 2023-07-18 ENCOUNTER — Other Ambulatory Visit (HOSPITAL_COMMUNITY): Payer: Self-pay

## 2023-07-18 MED ORDER — GABAPENTIN 100 MG PO CAPS
100.0000 mg | ORAL_CAPSULE | Freq: Two times a day (BID) | ORAL | 1 refills | Status: DC
  Filled 2023-07-18 – 2023-07-21 (×2): qty 180, 90d supply, fill #0
  Filled 2023-10-10 – 2023-10-11 (×2): qty 180, 90d supply, fill #1

## 2023-07-20 ENCOUNTER — Other Ambulatory Visit (HOSPITAL_COMMUNITY): Payer: Self-pay

## 2023-07-21 ENCOUNTER — Other Ambulatory Visit: Payer: Self-pay

## 2023-07-21 ENCOUNTER — Other Ambulatory Visit (HOSPITAL_COMMUNITY): Payer: Self-pay

## 2023-07-21 DIAGNOSIS — I272 Pulmonary hypertension, unspecified: Secondary | ICD-10-CM | POA: Diagnosis not present

## 2023-07-21 DIAGNOSIS — I509 Heart failure, unspecified: Secondary | ICD-10-CM | POA: Diagnosis not present

## 2023-07-21 DIAGNOSIS — J9610 Chronic respiratory failure, unspecified whether with hypoxia or hypercapnia: Secondary | ICD-10-CM | POA: Diagnosis not present

## 2023-07-22 ENCOUNTER — Other Ambulatory Visit (HOSPITAL_COMMUNITY): Payer: Self-pay

## 2023-07-24 ENCOUNTER — Other Ambulatory Visit: Payer: Self-pay

## 2023-07-24 ENCOUNTER — Other Ambulatory Visit (HOSPITAL_COMMUNITY): Payer: Self-pay

## 2023-07-24 ENCOUNTER — Ambulatory Visit (INDEPENDENT_AMBULATORY_CARE_PROVIDER_SITE_OTHER): Admitting: Adult Health

## 2023-07-24 ENCOUNTER — Encounter (INDEPENDENT_AMBULATORY_CARE_PROVIDER_SITE_OTHER): Payer: Self-pay | Admitting: Adult Health

## 2023-07-24 VITALS — BP 134/53 | HR 68 | Temp 97.9°F | Ht 68.0 in | Wt 308.0 lb

## 2023-07-24 DIAGNOSIS — Z7985 Long-term (current) use of injectable non-insulin antidiabetic drugs: Secondary | ICD-10-CM

## 2023-07-24 DIAGNOSIS — N184 Chronic kidney disease, stage 4 (severe): Secondary | ICD-10-CM | POA: Diagnosis not present

## 2023-07-24 DIAGNOSIS — Z6841 Body Mass Index (BMI) 40.0 and over, adult: Secondary | ICD-10-CM | POA: Diagnosis not present

## 2023-07-24 DIAGNOSIS — E1159 Type 2 diabetes mellitus with other circulatory complications: Secondary | ICD-10-CM | POA: Diagnosis not present

## 2023-07-24 DIAGNOSIS — I152 Hypertension secondary to endocrine disorders: Secondary | ICD-10-CM

## 2023-07-24 DIAGNOSIS — E669 Obesity, unspecified: Secondary | ICD-10-CM | POA: Diagnosis not present

## 2023-07-24 DIAGNOSIS — E1122 Type 2 diabetes mellitus with diabetic chronic kidney disease: Secondary | ICD-10-CM

## 2023-07-24 DIAGNOSIS — E1169 Type 2 diabetes mellitus with other specified complication: Secondary | ICD-10-CM

## 2023-07-24 MED ORDER — LOSARTAN POTASSIUM 25 MG PO TABS
12.5000 mg | ORAL_TABLET | Freq: Every day | ORAL | 3 refills | Status: AC
  Filled 2023-07-28 (×2): qty 45, 90d supply, fill #0
  Filled 2023-10-05: qty 45, 90d supply, fill #1
  Filled 2023-12-20: qty 45, 90d supply, fill #2

## 2023-07-24 NOTE — Progress Notes (Signed)
 WEIGHT SUMMARY AND BIOMETRICS  Vitals Temp: 97.9 F (36.6 C) BP: (!) 134/53 Pulse Rate: 68 SpO2: 97 %   Anthropometric Measurements Height: 5' 8 (1.727 m) Weight: (!) 308 lb (139.7 kg) BMI (Calculated): 46.84 Weight at Last Visit: 314 lb Weight Lost Since Last Visit: 6 lb Weight Gained Since Last Visit: 0 Starting Weight: 344 lb Total Weight Loss (lbs): 36 lb (16.3 kg)   Body Composition  Body Fat %: 54.7 % Fat Mass (lbs): 168.4 lbs Muscle Mass (lbs): 132.6 lbs Total Body Water  (lbs): 103 lbs Visceral Fat Rating : 21   Other Clinical Data Fasting: no Labs: no Today's Visit #: 60 Starting Date: 03/25/19    Chief Complaint:   OBESITY Adiba is here to discuss her progress with her obesity treatment plan.  She is on the the Category 2 Plan and states she is following her eating plan approximately 50 % of the time.  She states she is exercising Chair Exercises 10 minutes 3 times per week.  Interim History:  07/10/2023 PCP OV, re: RLE edema  Reviewed Bioimpedance results with pt: Muscle Mass: -1.4 lbs Adipose Mass: -4.6 lbs  Ozempic  increased to 1mg  on/about 02/28/2023   Denies mass in neck, dysphagia, dyspepsia, persistent hoarseness, abdominal pain, or N/V/C She denies sx's of hypoglycemia She has not been checking home CBG- she does have glucometer and supplies   She has moved out of her home- now living with her adult daughter and adult son in law and her grandson (age 31)  Subjective:   1. Type 2 diabetes mellitus with other specified complication, without long-term current use of insulin  (HCC) Lab Results  Component Value Date   HGBA1C 5.7 (H) 05/02/2023   HGBA1C 6.0 04/18/2023   HGBA1C 6.2 (H) 12/12/2022    She is currently on: aspirin  81 MG tablet  Blood Glucose Monitoring Suppl (ONETOUCH VERIO SYNC SYSTEM) w/Device KIT  dapagliflozin  propanediol (FARXIGA ) 5 MG TABS tablet  losartan  (COZAAR ) 25 MG tablet  isosorbide  dinitrate  (ISORDIL ) 10 MG tablet  hydrALAZINE  (APRESOLINE ) 50 MG tablet  atorvastatin  (LIPITOR) 20 MG tablet  carvedilol  (COREG ) 25 MG tablet  Semaglutide , 1 MG/DOSE, 4 MG/3ML SOPN  furosemide  (LASIX ) 80 MG tablet   Ozempic  increased to 1mg  on/about 02/28/2023   Denies mass in neck, dysphagia, dyspepsia, persistent hoarseness, abdominal pain, or N/V/C She denies sx's of hypoglycemia She has not been checking home CBG- she does have glucometer and supplies   2. Stage 4 chronic kidney disease (HCC)  Latest Reference Range & Units 02/03/23 08:28 04/18/23 10:41 05/02/23 09:20  Creatinine 0.57 - 1.00 mg/dL 4.25 (H) 9.56 (H) 3.87 (H)  (H): Data is abnormally high  Latest Reference Range & Units 07/14/22 13:53 02/03/23 08:28 04/18/23 10:41  GFR >60.00 mL/min 23.55 (L)  20.99 (L)  GFR, Est Non African American >60 mL/min  28 (L)   (L): Data is abnormally low  She is on aspirin  81 MG tablet  isosorbide  dinitrate (ISORDIL ) 10 MG tablet  hydrALAZINE  (APRESOLINE ) 50 MG tablet  atorvastatin  (LIPITOR) 20 MG tablet  carvedilol  (COREG ) 25 MG tablet  furosemide  (LASIX ) 80 MG tablet  losartan  (COZAAR ) 25 MG tablet   3. Hypertension associated with diabetes (HCC) BP recheck, reading improved She has not taken any morning antihypertensive therapy prior to OV She denies CP with exertion  Assessment/Plan:   1. Type 2 diabetes mellitus with other specified complication, without long-term current use of insulin  (HCC) (Primary) Continue healthy eating and remain active  Continue weekly Ozempic  1mg - does require refill today  2. Stage 4 chronic kidney disease (HCC) Keep BP well controlled Avoid NSAIDs  3. Hypertension associated with diabetes (HCC) Keep BP well controlled Avoid NSAIDs  4. Current BMI 46.8  Carmella is currently in the action stage of change. As such, her goal is to continue with weight loss efforts. She has agreed to the Category 2 Plan.   Exercise goals: Older adults should follow  the adult guidelines. When older adults cannot meet the adult guidelines, they should be as physically active as their abilities and conditions will allow.  Older adults should do exercises that maintain or improve balance if they are at risk of falling.  Older adults should determine their level of effort for physical activity relative to their level of fitness.  Older adults with chronic conditions should understand whether and how their conditions affect their ability to do regular physical activity safely.  Behavioral modification strategies: increasing lean protein intake, decreasing simple carbohydrates, increasing vegetables, increasing water  intake, meal planning and cooking strategies, keeping healthy foods in the home, and planning for success.  Objective:   Blood pressure (!) 134/53, pulse 68, temperature 97.9 F (36.6 C), height 5' 8 (1.727 m), weight (!) 308 lb (139.7 kg), SpO2 97%. Body mass index is 46.83 kg/m.  General: Cooperative, alert, well developed, in no acute distress. HEENT: Conjunctivae and lids unremarkable. Cardiovascular: Regular rhythm.  Lungs: Normal work of breathing. Neurologic: No focal deficits.   Lab Results  Component Value Date   CREATININE 2.01 (H) 05/02/2023   BUN 17 05/02/2023   NA 139 05/02/2023   K 4.3 05/02/2023   CL 98 05/02/2023   CO2 27 05/02/2023   Lab Results  Component Value Date   ALT 11 05/02/2023   AST 11 05/02/2023   ALKPHOS 86 05/02/2023   BILITOT 0.4 05/02/2023   Lab Results  Component Value Date   HGBA1C 5.7 (H) 05/02/2023   HGBA1C 6.0 04/18/2023   HGBA1C 6.2 (H) 12/12/2022   HGBA1C 6.5 07/14/2022   HGBA1C 6.5 04/21/2022   Lab Results  Component Value Date   INSULIN  6.9 05/02/2023   INSULIN  10.8 12/12/2022   INSULIN  8.6 10/28/2019   INSULIN  9.2 07/04/2019   INSULIN  8.8 03/25/2019   Lab Results  Component Value Date   TSH 0.51 11/28/2018   Lab Results  Component Value Date   CHOL 144 12/12/2022   HDL 63  12/12/2022   LDLCALC 68 12/12/2022   TRIG 62 12/12/2022   CHOLHDL 2.3 12/12/2022   Lab Results  Component Value Date   VD25OH 41.7 05/02/2023   VD25OH 36.8 12/12/2022   VD25OH 57.6 10/28/2019   Lab Results  Component Value Date   WBC 4.3 02/03/2023   HGB 13.5 02/03/2023   HCT 42.3 02/03/2023   MCV 92.6 02/03/2023   PLT 206 02/03/2023   Lab Results  Component Value Date   IRON 51 02/03/2023   TIBC 266 02/03/2023   FERRITIN 70 02/03/2023   Attestation Statements:   Reviewed by clinician on day of visit: allergies, medications, problem list, medical history, surgical history, family history, social history, and previous encounter notes.  "28 minutes spent face-to-face with the patient discussing management of disordered eating symptoms, reviewing current medications, and providing strategies for coping with emotional eating."  I have reviewed the above documentation for accuracy and completeness, and I agree with the above. -  Riata Ikeda d. Jacquees Gongora, NP-C

## 2023-07-25 ENCOUNTER — Other Ambulatory Visit: Payer: Self-pay

## 2023-07-26 DIAGNOSIS — J9610 Chronic respiratory failure, unspecified whether with hypoxia or hypercapnia: Secondary | ICD-10-CM | POA: Diagnosis not present

## 2023-07-26 DIAGNOSIS — I272 Pulmonary hypertension, unspecified: Secondary | ICD-10-CM | POA: Diagnosis not present

## 2023-07-26 DIAGNOSIS — I509 Heart failure, unspecified: Secondary | ICD-10-CM | POA: Diagnosis not present

## 2023-07-28 ENCOUNTER — Other Ambulatory Visit (HOSPITAL_COMMUNITY): Payer: Self-pay

## 2023-07-28 ENCOUNTER — Other Ambulatory Visit: Payer: Self-pay | Admitting: Family Medicine

## 2023-07-28 DIAGNOSIS — E876 Hypokalemia: Secondary | ICD-10-CM

## 2023-07-29 ENCOUNTER — Other Ambulatory Visit (HOSPITAL_COMMUNITY): Payer: Self-pay

## 2023-07-31 ENCOUNTER — Other Ambulatory Visit: Payer: Self-pay

## 2023-07-31 ENCOUNTER — Other Ambulatory Visit (HOSPITAL_COMMUNITY): Payer: Self-pay

## 2023-07-31 MED ORDER — POTASSIUM CHLORIDE CRYS ER 10 MEQ PO TBCR
10.0000 meq | EXTENDED_RELEASE_TABLET | Freq: Every day | ORAL | 2 refills | Status: DC
  Filled 2023-07-31: qty 60, 60d supply, fill #0
  Filled 2023-10-10 – 2023-10-11 (×3): qty 60, 60d supply, fill #1
  Filled 2023-12-02: qty 60, 60d supply, fill #2

## 2023-08-05 ENCOUNTER — Other Ambulatory Visit (HOSPITAL_COMMUNITY): Payer: Self-pay

## 2023-08-20 DIAGNOSIS — J9610 Chronic respiratory failure, unspecified whether with hypoxia or hypercapnia: Secondary | ICD-10-CM | POA: Diagnosis not present

## 2023-08-20 DIAGNOSIS — I509 Heart failure, unspecified: Secondary | ICD-10-CM | POA: Diagnosis not present

## 2023-08-20 DIAGNOSIS — I272 Pulmonary hypertension, unspecified: Secondary | ICD-10-CM | POA: Diagnosis not present

## 2023-08-25 DIAGNOSIS — I509 Heart failure, unspecified: Secondary | ICD-10-CM | POA: Diagnosis not present

## 2023-08-25 DIAGNOSIS — I272 Pulmonary hypertension, unspecified: Secondary | ICD-10-CM | POA: Diagnosis not present

## 2023-08-25 DIAGNOSIS — J9610 Chronic respiratory failure, unspecified whether with hypoxia or hypercapnia: Secondary | ICD-10-CM | POA: Diagnosis not present

## 2023-08-29 ENCOUNTER — Encounter (INDEPENDENT_AMBULATORY_CARE_PROVIDER_SITE_OTHER): Payer: Self-pay | Admitting: Adult Health

## 2023-08-29 ENCOUNTER — Ambulatory Visit (INDEPENDENT_AMBULATORY_CARE_PROVIDER_SITE_OTHER): Admitting: Adult Health

## 2023-08-29 VITALS — BP 138/63 | HR 60 | Temp 97.8°F | Ht 68.0 in | Wt 308.0 lb

## 2023-08-29 DIAGNOSIS — I70203 Unspecified atherosclerosis of native arteries of extremities, bilateral legs: Secondary | ICD-10-CM | POA: Diagnosis not present

## 2023-08-29 DIAGNOSIS — M792 Neuralgia and neuritis, unspecified: Secondary | ICD-10-CM | POA: Diagnosis not present

## 2023-08-29 DIAGNOSIS — Z7985 Long-term (current) use of injectable non-insulin antidiabetic drugs: Secondary | ICD-10-CM | POA: Diagnosis not present

## 2023-08-29 DIAGNOSIS — E1122 Type 2 diabetes mellitus with diabetic chronic kidney disease: Secondary | ICD-10-CM | POA: Diagnosis not present

## 2023-08-29 DIAGNOSIS — E1159 Type 2 diabetes mellitus with other circulatory complications: Secondary | ICD-10-CM

## 2023-08-29 DIAGNOSIS — E559 Vitamin D deficiency, unspecified: Secondary | ICD-10-CM | POA: Diagnosis not present

## 2023-08-29 DIAGNOSIS — Z6841 Body Mass Index (BMI) 40.0 and over, adult: Secondary | ICD-10-CM

## 2023-08-29 DIAGNOSIS — E669 Obesity, unspecified: Secondary | ICD-10-CM | POA: Diagnosis not present

## 2023-08-29 DIAGNOSIS — E1169 Type 2 diabetes mellitus with other specified complication: Secondary | ICD-10-CM

## 2023-08-29 DIAGNOSIS — N184 Chronic kidney disease, stage 4 (severe): Secondary | ICD-10-CM | POA: Diagnosis not present

## 2023-08-29 DIAGNOSIS — E1142 Type 2 diabetes mellitus with diabetic polyneuropathy: Secondary | ICD-10-CM | POA: Diagnosis not present

## 2023-08-29 DIAGNOSIS — I152 Hypertension secondary to endocrine disorders: Secondary | ICD-10-CM | POA: Diagnosis not present

## 2023-08-29 DIAGNOSIS — L600 Ingrowing nail: Secondary | ICD-10-CM | POA: Diagnosis not present

## 2023-08-29 DIAGNOSIS — M2041 Other hammer toe(s) (acquired), right foot: Secondary | ICD-10-CM | POA: Diagnosis not present

## 2023-08-29 DIAGNOSIS — B351 Tinea unguium: Secondary | ICD-10-CM | POA: Diagnosis not present

## 2023-08-29 NOTE — Progress Notes (Unsigned)
 WEIGHT SUMMARY AND BIOMETRICS  Vitals Temp: 97.8 F (36.6 C) BP: 138/63 Pulse Rate: 60 SpO2: 100 %   Anthropometric Measurements Height: 5' 8 (1.727 m) Weight: (!) 308 lb (139.7 kg) BMI (Calculated): 46.84 Weight at Last Visit: 308 lb Weight Lost Since Last Visit: 0 Weight Gained Since Last Visit: 0 Starting Weight: 344 lb Total Weight Loss (lbs): 36 lb (16.3 kg)   Body Composition  Body Fat %: 54.2 % Fat Mass (lbs): 167.2 lbs Muscle Mass (lbs): 134.2 lbs Total Body Water  (lbs): 103.2 lbs Visceral Fat Rating : 21   Other Clinical Data Fasting: no Labs: no Today's Visit #: 53 Starting Date: 03/25/19    Chief Complaint:   OBESITY Stacey Page is here to discuss her progress with her obesity treatment plan.  She is on the the Category 2 Plan and states she is following her eating plan approximately 50 % of the time.  She states she is exercising Chair Exercises 10 minutes 3 times per week.  Interim History:  Ozempic  increased to 1mg  on/about 02/28/2023  We will consider increasing at next refill  Reviewed Bioimpedance results with pt: Muscle Mass +1.6 lbs Adipose Mass: -1.2 lbs Reviewed Bioempedence Result  She has her portable O2 on at OV She uses when weather is hot/humid. She often does not wear at home or when weather is more temperate   She wears nightly CPAP with O2- 2L  Subjective:   1. Type 2 diabetes mellitus with other specified complication, without long-term current use of insulin  (HCC) Lab Results  Component Value Date   HGBA1C 5.7 (H) 05/02/2023   HGBA1C 6.0 04/18/2023   HGBA1C 6.2 (H) 12/12/2022    Home Fasting CBG will range 124-149 She denies sx's of hypogylcemia Ozempic  increased to 1mg  on/about 02/28/2023  We will consider increasing at next refill Denies mass in neck, dysphagia, dyspepsia, persistent hoarseness, abdominal pain, or N/V/C   2. Stage 4 chronic kidney disease (HCC)  BP recheck stable at OV   Latest  Reference Range & Units 02/03/23 08:28 04/18/23 10:41 05/02/23 09:20  Creatinine 0.57 - 1.00 mg/dL 8.07 (H) 7.70 (H) 7.98 (H)  (H): Data is abnormally high   Latest Reference Range & Units 07/14/22 13:53 02/03/23 08:28 04/18/23 10:41  GFR >60.00 mL/min 23.55 (L)  20.99 (L)  GFR, Est Non African American >60 mL/min  28 (L)   (L): Data is abnormally low  3. Hypertension associated with diabetes (HCC) BP recheck stable at OV Home readings  SBP: 110-140s, mostly <130 DBP: 60-70 She is currently on  aspirin  81 MG tablet  isosorbide  dinitrate (ISORDIL ) 10 MG tablet  hydrALAZINE  (APRESOLINE ) 50 MG tablet  atorvastatin  (LIPITOR) 20 MG tablet  carvedilol  (COREG ) 25 MG tablet  furosemide  (LASIX ) 80 MG tablet  losartan  (COZAAR ) 25 MG tablet   4. Vitamin D  deficiency  Latest Reference Range & Units 12/12/22 15:47 05/02/23 09:20  Vitamin D , 25-Hydroxy 30.0 - 100.0 ng/mL 36.8 41.7   She is on OTC Vit D3 1,000 international units - 2 caps daily  Assessment/Plan:   1. Type 2 diabetes mellitus with other specified complication, without long-term current use of insulin  (HCC) (Primary) Continue weekly Ozempic  1mg  Consider increasing dose at next needed refill  2. Stage 4 chronic kidney disease (HCC) Keep BP well controlled Avoid Nephrotoxic substances  3. Hypertension associated with diabetes (HCC) Continue  aspirin  81 MG tablet  isosorbide  dinitrate (ISORDIL ) 10 MG tablet  hydrALAZINE  (APRESOLINE ) 50 MG tablet  atorvastatin  (LIPITOR)  20 MG tablet  carvedilol  (COREG ) 25 MG tablet  furosemide  (LASIX ) 80 MG tablet  losartan  (COZAAR ) 25 MG tablet   4. Vitamin D  deficiency Check Labs at next OV  5. Current BMI 46.9  [name] is currently in the action stage of change. As such, her goal is to continue with weight loss efforts. She has agreed to the Category 2 Plan.   Exercise goals: Older adults should follow the adult guidelines. When older adults cannot meet the adult guidelines,  they should be as physically active as their abilities and conditions will allow.  Older adults should do exercises that maintain or improve balance if they are at risk of falling.  Older adults should determine their level of effort for physical activity relative to their level of fitness.  Older adults with chronic conditions should understand whether and how their conditions affect their ability to do regular physical activity safely.  Behavioral modification strategies: increasing lean protein intake, decreasing simple carbohydrates, increasing vegetables, increasing water  intake, no skipping meals, meal planning and cooking strategies, keeping healthy foods in the home, ways to avoid boredom eating, and planning for success.  Stacey Page has agreed to follow-up with our clinic in 4 weeks. She was informed of the importance of frequent follow-up visits to maximize her success with intensive lifestyle modifications for her multiple health conditions.   Check Fasting Labs at Next OV  Objective:   Blood pressure 138/63, pulse 60, temperature 97.8 F (36.6 C), height 5' 8 (1.727 m), weight (!) 308 lb (139.7 kg), SpO2 100%. Body mass index is 46.83 kg/m.  General: Cooperative, alert, well developed, in no acute distress. HEENT: Conjunctivae and lids unremarkable. Cardiovascular: Regular rhythm.  Lungs: Normal work of breathing. Neurologic: No focal deficits.   Lab Results  Component Value Date   CREATININE 2.01 (H) 05/02/2023   BUN 17 05/02/2023   NA 139 05/02/2023   K 4.3 05/02/2023   CL 98 05/02/2023   CO2 27 05/02/2023   Lab Results  Component Value Date   ALT 11 05/02/2023   AST 11 05/02/2023   ALKPHOS 86 05/02/2023   BILITOT 0.4 05/02/2023   Lab Results  Component Value Date   HGBA1C 5.7 (H) 05/02/2023   HGBA1C 6.0 04/18/2023   HGBA1C 6.2 (H) 12/12/2022   HGBA1C 6.5 07/14/2022   HGBA1C 6.5 04/21/2022   Lab Results  Component Value Date   INSULIN  6.9 05/02/2023    INSULIN  10.8 12/12/2022   INSULIN  8.6 10/28/2019   INSULIN  9.2 07/04/2019   INSULIN  8.8 03/25/2019   Lab Results  Component Value Date   TSH 0.51 11/28/2018   Lab Results  Component Value Date   CHOL 144 12/12/2022   HDL 63 12/12/2022   LDLCALC 68 12/12/2022   TRIG 62 12/12/2022   CHOLHDL 2.3 12/12/2022   Lab Results  Component Value Date   VD25OH 41.7 05/02/2023   VD25OH 36.8 12/12/2022   VD25OH 57.6 10/28/2019   Lab Results  Component Value Date   WBC 4.3 02/03/2023   HGB 13.5 02/03/2023   HCT 42.3 02/03/2023   MCV 92.6 02/03/2023   PLT 206 02/03/2023   Lab Results  Component Value Date   IRON 51 02/03/2023   TIBC 266 02/03/2023   FERRITIN 70 02/03/2023   Attestation Statements:   Reviewed by clinician on day of visit: allergies, medications, problem list, medical history, surgical history, family history, social history, and previous encounter notes.  Time spent on visit including pre-visit chart review and  post-visit care and charting was 26 minutes.   I have reviewed the above documentation for accuracy and completeness, and I agree with the above. -  Shaine Newmark d. Chena Chohan, NP-C

## 2023-09-02 ENCOUNTER — Other Ambulatory Visit (HOSPITAL_COMMUNITY): Payer: Self-pay

## 2023-09-06 ENCOUNTER — Other Ambulatory Visit (HOSPITAL_COMMUNITY): Payer: Self-pay

## 2023-09-11 ENCOUNTER — Telehealth: Payer: Self-pay

## 2023-09-11 NOTE — Telephone Encounter (Signed)
 Called patient to inform her that Ozempic  pens have arrived at clinic and is ready for pick up. Patient stated she will hold off for now due to possible dosage increase for 1MG  and may need to be sent back. Ozempic  pens were placed in the clinic fridge on 09/11/2023 @ 10:45 am.

## 2023-09-11 NOTE — Telephone Encounter (Signed)
 Please see message below

## 2023-09-13 ENCOUNTER — Other Ambulatory Visit: Payer: Self-pay | Admitting: Family Medicine

## 2023-09-13 DIAGNOSIS — Z1231 Encounter for screening mammogram for malignant neoplasm of breast: Secondary | ICD-10-CM

## 2023-09-20 ENCOUNTER — Telehealth: Payer: Self-pay

## 2023-09-20 DIAGNOSIS — J9610 Chronic respiratory failure, unspecified whether with hypoxia or hypercapnia: Secondary | ICD-10-CM | POA: Diagnosis not present

## 2023-09-20 DIAGNOSIS — I509 Heart failure, unspecified: Secondary | ICD-10-CM | POA: Diagnosis not present

## 2023-09-20 DIAGNOSIS — I272 Pulmonary hypertension, unspecified: Secondary | ICD-10-CM | POA: Diagnosis not present

## 2023-09-20 NOTE — Progress Notes (Signed)
   09/20/2023  Patient ID: Stacey Page, female   DOB: 1950-04-21, 73 y.o.   MRN: [id number]  In basket message from PCP office stating Ozempic  1mg  pens from [facility] PAP recently arrived at office, but patient declined pick up at this time.  Contacted patient, and she sees Rockie Dalton, NP, with weight management again 8/28 and wants to hold off on getting Ozempic  1mg  pens in case dose is increased to 2mg  at this visit.  Patient currently has three 1mg  pens (3 month supply on hand).  Coordinating with weight management provider, so I can initiate Ozempic  order change form if Ozempic  is increased to 2mg  at next visit.  Advising Dr. Tresa office to hold on to 1mg  pens at this time in case dose does not get increased.  Stacey Page, PharmD, DPLA

## 2023-09-22 NOTE — Telephone Encounter (Signed)
 Deanna Channing LABOR, RPH  [facility] Team; [place], Dedra PARAS, RN Ms. Tranchina currently has a 3 month supply of Ozempic  1mg  on hand and sees weight management again 8/28.  Please hold on to the 1mg  pens recently sent to the office from [facility] until she knows if they will be keeping her at 1mg  or increasing to 2mg .  Thank you!

## 2023-09-25 DIAGNOSIS — J9610 Chronic respiratory failure, unspecified whether with hypoxia or hypercapnia: Secondary | ICD-10-CM | POA: Diagnosis not present

## 2023-09-25 DIAGNOSIS — I509 Heart failure, unspecified: Secondary | ICD-10-CM | POA: Diagnosis not present

## 2023-09-25 DIAGNOSIS — I272 Pulmonary hypertension, unspecified: Secondary | ICD-10-CM | POA: Diagnosis not present

## 2023-09-28 ENCOUNTER — Other Ambulatory Visit (HOSPITAL_COMMUNITY): Payer: Self-pay

## 2023-09-28 DIAGNOSIS — N184 Chronic kidney disease, stage 4 (severe): Secondary | ICD-10-CM | POA: Diagnosis not present

## 2023-09-28 DIAGNOSIS — R55 Syncope and collapse: Secondary | ICD-10-CM | POA: Diagnosis not present

## 2023-09-28 DIAGNOSIS — D631 Anemia in chronic kidney disease: Secondary | ICD-10-CM | POA: Diagnosis not present

## 2023-09-28 DIAGNOSIS — E1129 Type 2 diabetes mellitus with other diabetic kidney complication: Secondary | ICD-10-CM | POA: Diagnosis not present

## 2023-09-28 DIAGNOSIS — R809 Proteinuria, unspecified: Secondary | ICD-10-CM | POA: Diagnosis not present

## 2023-09-28 DIAGNOSIS — N2581 Secondary hyperparathyroidism of renal origin: Secondary | ICD-10-CM | POA: Diagnosis not present

## 2023-09-28 DIAGNOSIS — E1122 Type 2 diabetes mellitus with diabetic chronic kidney disease: Secondary | ICD-10-CM | POA: Diagnosis not present

## 2023-09-28 DIAGNOSIS — I5032 Chronic diastolic (congestive) heart failure: Secondary | ICD-10-CM | POA: Diagnosis not present

## 2023-09-28 DIAGNOSIS — I129 Hypertensive chronic kidney disease with stage 1 through stage 4 chronic kidney disease, or unspecified chronic kidney disease: Secondary | ICD-10-CM | POA: Diagnosis not present

## 2023-09-28 LAB — LAB REPORT - SCANNED: EGFR: 29

## 2023-09-28 MED ORDER — FUROSEMIDE 80 MG PO TABS
ORAL_TABLET | ORAL | 3 refills | Status: AC
  Filled 2023-10-10 – 2023-10-11 (×2): qty 90, 30d supply, fill #0
  Filled 2023-12-20: qty 90, 90d supply, fill #0

## 2023-10-04 ENCOUNTER — Ambulatory Visit
Admission: RE | Admit: 2023-10-04 | Discharge: 2023-10-04 | Disposition: A | Discharge status: Home / Self Care | Source: Ambulatory Visit | Attending: Family Medicine | Admitting: Family Medicine

## 2023-10-04 DIAGNOSIS — Z1231 Encounter for screening mammogram for malignant neoplasm of breast: Secondary | ICD-10-CM

## 2023-10-05 ENCOUNTER — Other Ambulatory Visit (HOSPITAL_COMMUNITY): Payer: Self-pay

## 2023-10-05 ENCOUNTER — Encounter (INDEPENDENT_AMBULATORY_CARE_PROVIDER_SITE_OTHER): Payer: Self-pay | Admitting: Adult Health

## 2023-10-05 ENCOUNTER — Telehealth: Payer: Self-pay

## 2023-10-05 ENCOUNTER — Ambulatory Visit (INDEPENDENT_AMBULATORY_CARE_PROVIDER_SITE_OTHER): Admitting: Adult Health

## 2023-10-05 ENCOUNTER — Other Ambulatory Visit: Payer: Self-pay

## 2023-10-05 VITALS — BP 148/77 | HR 59 | Temp 97.7°F | Ht 68.0 in | Wt 306.0 lb

## 2023-10-05 DIAGNOSIS — Z6841 Body Mass Index (BMI) 40.0 and over, adult: Secondary | ICD-10-CM

## 2023-10-05 DIAGNOSIS — E1159 Type 2 diabetes mellitus with other circulatory complications: Secondary | ICD-10-CM

## 2023-10-05 DIAGNOSIS — E1169 Type 2 diabetes mellitus with other specified complication: Secondary | ICD-10-CM

## 2023-10-05 DIAGNOSIS — I152 Hypertension secondary to endocrine disorders: Secondary | ICD-10-CM | POA: Diagnosis not present

## 2023-10-05 DIAGNOSIS — E669 Obesity, unspecified: Secondary | ICD-10-CM | POA: Diagnosis not present

## 2023-10-05 DIAGNOSIS — I498 Other specified cardiac arrhythmias: Secondary | ICD-10-CM

## 2023-10-05 DIAGNOSIS — E1122 Type 2 diabetes mellitus with diabetic chronic kidney disease: Secondary | ICD-10-CM | POA: Diagnosis not present

## 2023-10-05 DIAGNOSIS — Z7984 Long term (current) use of oral hypoglycemic drugs: Secondary | ICD-10-CM

## 2023-10-05 DIAGNOSIS — Z7985 Long-term (current) use of injectable non-insulin antidiabetic drugs: Secondary | ICD-10-CM | POA: Diagnosis not present

## 2023-10-05 DIAGNOSIS — E559 Vitamin D deficiency, unspecified: Secondary | ICD-10-CM | POA: Diagnosis not present

## 2023-10-05 DIAGNOSIS — N184 Chronic kidney disease, stage 4 (severe): Secondary | ICD-10-CM

## 2023-10-05 MED ORDER — SEMAGLUTIDE (1 MG/DOSE) 4 MG/3ML ~~LOC~~ SOPN
1.0000 mg | PEN_INJECTOR | SUBCUTANEOUS | 0 refills | Status: AC
  Filled 2023-10-05: qty 9, 84d supply, fill #0

## 2023-10-05 NOTE — Progress Notes (Unsigned)
 WEIGHT SUMMARY AND BIOMETRICS  Vitals Temp: 97.7 F (36.5 C) BP: (!) 148/77 Pulse Rate: (!) 59 SpO2: 96 %   Anthropometric Measurements Height: 5' 8 (1.727 m) Weight: (!) 306 lb (138.8 kg) BMI (Calculated): 46.54 Weight at Last Visit: 308 lb Weight Lost Since Last Visit: 2 lb Weight Gained Since Last Visit: 0 Starting Weight: 344 lb Total Weight Loss (lbs): 38 lb (17.2 kg)   Body Composition  Body Fat %: 53.2 % Fat Mass (lbs): 163 lbs Muscle Mass (lbs): 136.2 lbs Total Body Water  (lbs): 100.2 lbs Visceral Fat Rating : 19   Other Clinical Data Fasting: yes Labs: yes Today's Visit #: 29 Starting Date: 03/25/19    Chief Complaint:   OBESITY Stacey Page is here to discuss her progress with her obesity treatment plan.  She is on the the Category 2 Plan and states she is following her eating plan approximately 50 % of the time.  She states she is exercising Chair Exercises 10 minutes 3 times per week.  Interim History:  Stacey Page presents today without supplemental oxygen  or assistive walking device. She is accompanied by her daughter.  Reviewed Bioimpedance results: Muscle Mass: +2 lbs Adipose Mass: -4.2 lbs  Subjective:   1. Type 2 diabetes mellitus with other specified complication, without long-term current use of insulin  (HCC) She is currently on daily Farxiga  5mg  (managed by PCP) and weekly Ozempic  1mg  (managed by [facility]). She is not checking home CBG, however denies sx's of hypogylcemia and she reports keeping glucose tabs on her person at all times. Denies mass in neck, dysphagia, dyspepsia, persistent hoarseness, abdominal pain, or N/V/C  She is tolerating Ozempic  1mg  without SE and is losing weight while maintaining her muscle mass- agree to remain on this strength  2. Stage 4 chronic kidney disease (HCC) She was recently seen by her established Nephrologist- she provided letter from Dr Katheryn Saba with her most recent creat level at 1.82-  stable Her BP is elevated at OV, however she denies acute cardiac sx's and she reports being rushed today.  3. Vitamin D  deficiency She is on daily OTC Vit D3 2,000 international units   4. Hypertension associated with diabetes (HCC) Her BP is elevated at OV, however she denies acute cardiac sx's and she reports being rushed today. aspirin  81 MG tablet  isosorbide  dinitrate (ISORDIL ) 10 MG tablet  hydrALAZINE  (APRESOLINE ) 50 MG tablet  atorvastatin  (LIPITOR) 20 MG tablet  carvedilol  (COREG ) 25 MG tablet  losartan  (COZAAR ) 25 MG tablet  furosemide  (LASIX ) 80 MG tablet   5. Fluttering  Heart Stacey Page recent episodes of heart flutters while seated/at rest Flutters will last only last a few seconds then resolve She denies CP with exertion She has not increased caffeine intake or started using tobacco/vape products  Assessment/Plan:   1. Type 2 diabetes mellitus with other specified complication, without long-term current use of insulin  (HCC) (Primary) Check Labs - Hemoglobin A1c - Insulin , random Refill  2. Stage 4 chronic kidney disease (HCC) Check Labs - Comprehensive metabolic panel with GFR  3. Vitamin D  deficiency Check Labs - Vitamin D  (25 hydroxy)  4. Hypertension associated with diabetes (HCC) Check Labs - Comprehensive metabolic panel with GFR Continue aspirin  81 MG tablet  isosorbide  dinitrate (ISORDIL ) 10 MG tablet  hydrALAZINE  (APRESOLINE ) 50 MG tablet  atorvastatin  (LIPITOR) 20 MG tablet  carvedilol  (COREG ) 25 MG tablet  losartan  (COZAAR ) 25 MG tablet  furosemide  (LASIX ) 80 MG tablet   5. Fluttering  Heart Check Labs -TSH and Free T4  6. Current BMI 46.9  Stacey Page is currently in the action stage of change. As such, her goal is to continue with weight loss efforts. She has agreed to the Category 2 Plan.   Exercise goals: Older adults should follow the adult guidelines. When older adults cannot meet the adult guidelines, they should be as  physically active as their abilities and conditions will allow.  Older adults should do exercises that maintain or improve balance if they are at risk of falling.  Older adults should determine their level of effort for physical activity relative to their level of fitness.  Older adults with chronic conditions should understand whether and how their conditions affect their ability to do regular physical activity safely.  Behavioral modification strategies: increasing lean protein intake, decreasing simple carbohydrates, increasing vegetables, increasing water  intake, no skipping meals, meal planning and cooking strategies, keeping healthy foods in the home, ways to avoid boredom eating, and planning for success.  Stacey Page has agreed to follow-up with our clinic in 4 weeks. She was informed of the importance of frequent follow-up visits to maximize her success with intensive lifestyle modifications for her multiple health conditions.   Stacey Page was informed we would discuss her lab results at her next visit unless there is a critical issue that needs to be addressed sooner. Stacey Page agreed to keep her next visit at the agreed upon time to discuss these results.  Objective:   Blood pressure (!) 148/77, pulse (!) 59, temperature 97.7 F (36.5 C), height 5' 8 (1.727 m), weight (!) 306 lb (138.8 kg), SpO2 96%. Body mass index is 46.53 kg/m.  General: Cooperative, alert, well developed, in no acute distress. HEENT: Conjunctivae and lids unremarkable. Cardiovascular: Regular rhythm.  Lungs: Normal work of breathing. Neurologic: No focal deficits.   Lab Results  Component Value Date   CREATININE 2.01 (H) 05/02/2023   BUN 17 05/02/2023   NA 139 05/02/2023   K 4.3 05/02/2023   CL 98 05/02/2023   CO2 27 05/02/2023   Lab Results  Component Value Date   ALT 11 05/02/2023   AST 11 05/02/2023   ALKPHOS 86 05/02/2023   BILITOT 0.4 05/02/2023   Lab Results  Component Value Date   HGBA1C 5.7  (H) 05/02/2023   HGBA1C 6.0 04/18/2023   HGBA1C 6.2 (H) 12/12/2022   HGBA1C 6.5 07/14/2022   HGBA1C 6.5 04/21/2022   Lab Results  Component Value Date   INSULIN  6.9 05/02/2023   INSULIN  10.8 12/12/2022   INSULIN  8.6 10/28/2019   INSULIN  9.2 07/04/2019   INSULIN  8.8 03/25/2019   Lab Results  Component Value Date   TSH 0.51 11/28/2018   Lab Results  Component Value Date   CHOL 144 12/12/2022   HDL 63 12/12/2022   LDLCALC 68 12/12/2022   TRIG 62 12/12/2022   CHOLHDL 2.3 12/12/2022   Lab Results  Component Value Date   VD25OH 41.7 05/02/2023   VD25OH 36.8 12/12/2022   VD25OH 57.6 10/28/2019   Lab Results  Component Value Date   WBC 4.3 02/03/2023   HGB 13.5 02/03/2023   HCT 42.3 02/03/2023   MCV 92.6 02/03/2023   PLT 206 02/03/2023   Lab Results  Component Value Date   IRON 51 02/03/2023   TIBC 266 02/03/2023   FERRITIN 70 02/03/2023    Attestation Statements:   Reviewed by clinician on day of visit: allergies, medications, problem list, medical history, surgical history, family history, social history, and  previous encounter notes.  I have reviewed the above documentation for accuracy and completeness, and I agree with the above. -  Shantana Christon d. Chenille Toor, NP-C

## 2023-10-05 NOTE — Progress Notes (Signed)
   10/05/2023  Patient ID: Stacey Page, female   DOB: [birth year], 73 y.o.   MRN: [id number]  Patient was seen by weight management provider today and will continue Ozempic  at 1mg  dose.  These pens were delivered to Dr. Tresa office 8/4, but patient did not pick up in case dose would be adjusted at today's weight management visit.  Attempted to call patient to remind 1mg  pens available for pick up at [facility], but I was not able to reach her or leave a voicemail.  Channing DELENA Mealing, PharmD, DPLA

## 2023-10-06 ENCOUNTER — Other Ambulatory Visit: Payer: Self-pay

## 2023-10-06 LAB — HEMOGLOBIN A1C
Est. average glucose Bld gHb Est-mCnc: 117 mg/dL
Hgb A1c MFr Bld: 5.7 % — ABNORMAL HIGH (ref 4.8–5.6)

## 2023-10-06 LAB — COMPREHENSIVE METABOLIC PANEL WITH GFR
ALT: 9 IU/L (ref 0–32)
AST: 11 IU/L (ref 0–40)
Albumin: 4.2 g/dL (ref 3.8–4.8)
Alkaline Phosphatase: 78 IU/L (ref 44–121)
BUN/Creatinine Ratio: 11 — ABNORMAL LOW (ref 12–28)
BUN: 19 mg/dL (ref 8–27)
Bilirubin Total: 0.4 mg/dL (ref 0.0–1.2)
CO2: 24 mmol/L (ref 20–29)
Calcium: 9.7 mg/dL (ref 8.7–10.3)
Chloride: 100 mmol/L (ref 96–106)
Creatinine, Ser: 1.77 mg/dL — ABNORMAL HIGH (ref 0.57–1.00)
Globulin, Total: 2.9 g/dL (ref 1.5–4.5)
Glucose: 82 mg/dL (ref 70–99)
Potassium: 4.2 mmol/L (ref 3.5–5.2)
Sodium: 140 mmol/L (ref 134–144)
Total Protein: 7.1 g/dL (ref 6.0–8.5)
eGFR: 30 mL/min/1.73 — ABNORMAL LOW (ref >59)

## 2023-10-06 LAB — TSH+FREE T4
Free T4: 1.22 ng/dL (ref 0.82–1.77)
TSH: 0.644 u[IU]/mL (ref 0.450–4.500)

## 2023-10-06 LAB — VITAMIN D 25 HYDROXY (VIT D DEFICIENCY, FRACTURES): Vit D, 25-Hydroxy: 36 ng/mL (ref 30.0–100.0)

## 2023-10-06 LAB — INSULIN, RANDOM: INSULIN: 8.4 u[IU]/mL (ref 2.6–24.9)

## 2023-10-06 NOTE — Telephone Encounter (Signed)
 Pt picked up her ozempic  10/06/23.

## 2023-10-10 ENCOUNTER — Other Ambulatory Visit (HOSPITAL_COMMUNITY): Payer: Self-pay

## 2023-10-10 ENCOUNTER — Other Ambulatory Visit: Payer: Self-pay | Admitting: Cardiology

## 2023-10-11 ENCOUNTER — Other Ambulatory Visit: Payer: Self-pay

## 2023-10-11 ENCOUNTER — Other Ambulatory Visit (HOSPITAL_COMMUNITY): Payer: Self-pay

## 2023-10-11 MED ORDER — HYDRALAZINE HCL 50 MG PO TABS
50.0000 mg | ORAL_TABLET | Freq: Three times a day (TID) | ORAL | 0 refills | Status: DC
  Filled 2023-10-11: qty 90, 30d supply, fill #0

## 2023-10-12 ENCOUNTER — Other Ambulatory Visit: Payer: Self-pay

## 2023-10-12 ENCOUNTER — Other Ambulatory Visit (HOSPITAL_COMMUNITY): Payer: Self-pay

## 2023-10-13 ENCOUNTER — Other Ambulatory Visit: Payer: Self-pay

## 2023-10-16 ENCOUNTER — Other Ambulatory Visit (HOSPITAL_COMMUNITY): Payer: Self-pay

## 2023-10-21 ENCOUNTER — Other Ambulatory Visit (HOSPITAL_COMMUNITY): Payer: Self-pay

## 2023-10-21 DIAGNOSIS — I509 Heart failure, unspecified: Secondary | ICD-10-CM | POA: Diagnosis not present

## 2023-10-21 DIAGNOSIS — J9610 Chronic respiratory failure, unspecified whether with hypoxia or hypercapnia: Secondary | ICD-10-CM | POA: Diagnosis not present

## 2023-10-21 DIAGNOSIS — I272 Pulmonary hypertension, unspecified: Secondary | ICD-10-CM | POA: Diagnosis not present

## 2023-10-26 DIAGNOSIS — I509 Heart failure, unspecified: Secondary | ICD-10-CM | POA: Diagnosis not present

## 2023-10-26 DIAGNOSIS — I272 Pulmonary hypertension, unspecified: Secondary | ICD-10-CM | POA: Diagnosis not present

## 2023-10-26 DIAGNOSIS — J9610 Chronic respiratory failure, unspecified whether with hypoxia or hypercapnia: Secondary | ICD-10-CM | POA: Diagnosis not present

## 2023-10-31 DIAGNOSIS — M792 Neuralgia and neuritis, unspecified: Secondary | ICD-10-CM | POA: Diagnosis not present

## 2023-10-31 DIAGNOSIS — L84 Corns and callosities: Secondary | ICD-10-CM | POA: Diagnosis not present

## 2023-10-31 DIAGNOSIS — E1142 Type 2 diabetes mellitus with diabetic polyneuropathy: Secondary | ICD-10-CM | POA: Diagnosis not present

## 2023-10-31 DIAGNOSIS — I70203 Unspecified atherosclerosis of native arteries of extremities, bilateral legs: Secondary | ICD-10-CM | POA: Diagnosis not present

## 2023-10-31 DIAGNOSIS — B351 Tinea unguium: Secondary | ICD-10-CM | POA: Diagnosis not present

## 2023-10-31 DIAGNOSIS — M2041 Other hammer toe(s) (acquired), right foot: Secondary | ICD-10-CM | POA: Diagnosis not present

## 2023-10-31 DIAGNOSIS — L600 Ingrowing nail: Secondary | ICD-10-CM | POA: Diagnosis not present

## 2023-11-07 ENCOUNTER — Ambulatory Visit (INDEPENDENT_AMBULATORY_CARE_PROVIDER_SITE_OTHER): Admitting: Adult Health

## 2023-11-07 ENCOUNTER — Encounter (INDEPENDENT_AMBULATORY_CARE_PROVIDER_SITE_OTHER): Payer: Self-pay | Admitting: Adult Health

## 2023-11-07 VITALS — BP 143/64 | HR 66 | Temp 97.6°F | Ht 68.0 in | Wt 307.0 lb

## 2023-11-07 DIAGNOSIS — Z7985 Long-term (current) use of injectable non-insulin antidiabetic drugs: Secondary | ICD-10-CM | POA: Diagnosis not present

## 2023-11-07 DIAGNOSIS — E1122 Type 2 diabetes mellitus with diabetic chronic kidney disease: Secondary | ICD-10-CM | POA: Diagnosis not present

## 2023-11-07 DIAGNOSIS — E876 Hypokalemia: Secondary | ICD-10-CM | POA: Diagnosis not present

## 2023-11-07 DIAGNOSIS — Z6841 Body Mass Index (BMI) 40.0 and over, adult: Secondary | ICD-10-CM | POA: Diagnosis not present

## 2023-11-07 DIAGNOSIS — N184 Chronic kidney disease, stage 4 (severe): Secondary | ICD-10-CM

## 2023-11-07 DIAGNOSIS — E1169 Type 2 diabetes mellitus with other specified complication: Secondary | ICD-10-CM

## 2023-11-07 DIAGNOSIS — Z7984 Long term (current) use of oral hypoglycemic drugs: Secondary | ICD-10-CM

## 2023-11-07 DIAGNOSIS — E559 Vitamin D deficiency, unspecified: Secondary | ICD-10-CM

## 2023-11-07 DIAGNOSIS — Z0000 Encounter for general adult medical examination without abnormal findings: Secondary | ICD-10-CM

## 2023-11-07 NOTE — Progress Notes (Signed)
 WEIGHT SUMMARY AND BIOMETRICS  Vitals Temp: 97.6 F (36.4 C) BP: (!) 143/64 Pulse Rate: 66 SpO2: 94 %   Anthropometric Measurements Height: 5' 8 (1.727 m) Weight: (!) 307 lb (139.3 kg) BMI (Calculated): 46.69 Weight at Last Visit: 306 lb Weight Lost Since Last Visit: 0 Weight Gained Since Last Visit: 1 lb Starting Weight: 344 lb Total Weight Loss (lbs): 37 lb (16.8 kg)   Body Composition  Body Fat %: 53.6 % Fat Mass (lbs): 164.6 lbs Muscle Mass (lbs): 135.2 lbs Total Body Water  (lbs): 100.6 lbs Visceral Fat Rating : 21   Other Clinical Data Fasting: no Labs: no Today's Visit #: 37 Starting Date: 03/25/19    Chief Complaint:   OBESITY Stacey Page is here to discuss her progress with her obesity treatment plan.  She is on the the Category 2 Plan and states she is following her eating plan approximately 50-75 % of the time.  She states she is exercising Walking/Chair 15/15 minutes 3/2 times per week.  Interim History:  She recently moved in with her daughter (age 12), son-in-law (age 62), and her grandson (age 39)  She is adjusting to the new family dynamics.  She is looking forward to Cruise for a family wedding in Nov 2025.  She is tolerating weekly Ozempic  1mg   Subjective:   1. Vitamin D  deficiency Discussed Labs  Vit D Level well below goal of 50-70   Latest Reference Range & Units 10/05/23 12:32  Vitamin D , 25-Hydroxy 30.0 - 100.0 ng/mL 36.0   She is taking daily OTC Vit D3 1,000 international units - 2 tabs daily= 2,000   2. Type 2 diabetes mellitus with other specified complication, without long-term current use of insulin  (HCC) Discussed Labs  CBG, A1c, and Insulin  Levels all stable  She is currently on daily Farxiga  5mg  (managed by PCP) and weekly Ozempic  1mg  (managed by [facility]). She is not checking home CBG, however denies sx's of hypogylcemia and she reports keeping glucose tabs on her person at all times. Denies mass in neck,  dysphagia, dyspepsia, persistent hoarseness, abdominal pain, or N/V/C  She is tolerating Ozempic  1mg  without SE and is losing weight while maintaining her muscle mass- agree to remain on this strength   Latest Reference Range & Units 10/05/23 12:32  Glucose 70 - 99 mg/dL 82  Hemoglobin J8R 4.8 - 5.6 % 5.7 (H)  Est. average glucose Bld gHb Est-mCnc mg/dL 882  INSULIN  2.6 - 24.9 uIU/mL 8.4  (H): Data is abnormally high  3. Stage 4 chronic kidney disease (HCC) Discussed Labs  CKD slightly improved   Latest Reference Range & Units 10/05/23 12:32  Creatinine 0.57 - 1.00 mg/dL 8.22 (H)  Calcium  8.7 - 10.3 mg/dL 9.7  BUN/Creatinine Ratio 12 - 28  11 (L)  eGFR >59 mL/min/1.73 30 (L)  (H): Data is abnormally high (L): Data is abnormally low  4. Hypokalemia Discussed Labs  K+ level normal She denies palpitations   Latest Reference Range & Units 10/05/23 12:32  Potassium 3.5 - 5.2 mmol/L 4.2   5. Healthcare maintenance Discussed Labs  TSH and Free T4 - normal   Latest Reference Range & Units 10/05/23 14:17  TSH 0.450 - 4.500 uIU/mL 0.644  T4,Free(Direct) 0.82 - 1.77 ng/dL 8.77   Assessment/Plan:   1. Vitamin D  deficiency Increase to OTC Vit D3 1,000 international units - 3 tabs daily= 3,000 international units   2. Type 2 diabetes mellitus with other specified complication, without long-term current use  of insulin  (HCC) (Primary) Continue current antidiabetic regime  3. Stage 4 chronic kidney disease (HCC) Keep BP well controlled Avoid Nephrotoxic substances  4. Hypokalemia Monitor Labs Monitor for acute cardiac sx's  5. Healthcare maintenance Continue healthy eating and regular exercise  6. Current BMI 46.7  Stacey Page is currently in the action stage of change. As such, her goal is to continue with weight loss efforts. She has agreed to the Category 2 Plan.   Exercise goals: Older adults should follow the adult guidelines. When older adults cannot meet the adult  guidelines, they should be as physically active as their abilities and conditions will allow.  Older adults should do exercises that maintain or improve balance if they are at risk of falling.  Older adults should determine their level of effort for physical activity relative to their level of fitness.  Older adults with chronic conditions should understand whether and how their conditions affect their ability to do regular physical activity safely.  Behavioral modification strategies: increasing lean protein intake, decreasing simple carbohydrates, increasing vegetables, increasing water  intake, meal planning and cooking strategies, keeping healthy foods in the home, ways to avoid boredom eating, and planning for success.  Stacey Page has agreed to follow-up with our clinic in 4 weeks. She was informed of the importance of frequent follow-up visits to maximize her success with intensive lifestyle modifications for her multiple health conditions.   Objective:   Blood pressure (!) 143/64, pulse 66, temperature 97.6 F (36.4 C), height 5' 8 (1.727 m), weight (!) 307 lb (139.3 kg), SpO2 94%. Body mass index is 46.68 kg/m.  General: Cooperative, alert, well developed, in no acute distress. HEENT: Conjunctivae and lids unremarkable. Cardiovascular: Regular rhythm.  Lungs: Normal work of breathing. Neurologic: No focal deficits.   Lab Results  Component Value Date   CREATININE 1.77 (H) 10/05/2023   BUN 19 10/05/2023   NA 140 10/05/2023   K 4.2 10/05/2023   CL 100 10/05/2023   CO2 24 10/05/2023   Lab Results  Component Value Date   ALT 9 10/05/2023   AST 11 10/05/2023   ALKPHOS 78 10/05/2023   BILITOT 0.4 10/05/2023   Lab Results  Component Value Date   HGBA1C 5.7 (H) 10/05/2023   HGBA1C 5.7 (H) 05/02/2023   HGBA1C 6.0 04/18/2023   HGBA1C 6.2 (H) 12/12/2022   HGBA1C 6.5 07/14/2022   Lab Results  Component Value Date   INSULIN  8.4 10/05/2023   INSULIN  6.9 05/02/2023   INSULIN   10.8 12/12/2022   INSULIN  8.6 10/28/2019   INSULIN  9.2 07/04/2019   Lab Results  Component Value Date   TSH 0.644 10/05/2023   Lab Results  Component Value Date   CHOL 144 12/12/2022   HDL 63 12/12/2022   LDLCALC 68 12/12/2022   TRIG 62 12/12/2022   CHOLHDL 2.3 12/12/2022   Lab Results  Component Value Date   VD25OH 36.0 10/05/2023   VD25OH 41.7 05/02/2023   VD25OH 36.8 12/12/2022   Lab Results  Component Value Date   WBC 4.3 02/03/2023   HGB 13.5 02/03/2023   HCT 42.3 02/03/2023   MCV 92.6 02/03/2023   PLT 206 02/03/2023   Lab Results  Component Value Date   IRON 51 02/03/2023   TIBC 266 02/03/2023   FERRITIN 70 02/03/2023   Attestation Statements:   Reviewed by clinician on day of visit: allergies, medications, problem list, medical history, surgical history, family history, social history, and previous encounter notes.  I have reviewed the above documentation  for accuracy and completeness, and I agree with the above. -  Dorrell Mitcheltree d. Sharron Simpson, NP-C

## 2023-11-09 NOTE — Progress Notes (Signed)
   11/09/2023  Patient ID: Othel Gosling, female   DOB: February 05, 1951, 73 y.o.   MRN: [id number]  This patient is appearing on a report for being at risk of failing the adherence measure for diabetes medications this calendar year.   Medication: Ozempic  1 mg/dose (4 mg/3 mL) pen Last fill date: 10/10/2023 for 84 day supply  Insurance report was not up to date. No action needed at this time.   Reyn Faivre C. Gratia Disla [facility] PharmD Candidate Class of [phone]

## 2023-11-11 ENCOUNTER — Other Ambulatory Visit: Payer: Self-pay | Admitting: Cardiology

## 2023-11-13 ENCOUNTER — Other Ambulatory Visit: Payer: Self-pay

## 2023-11-13 ENCOUNTER — Other Ambulatory Visit (HOSPITAL_COMMUNITY): Payer: Self-pay

## 2023-11-13 MED ORDER — HYDRALAZINE HCL 50 MG PO TABS
50.0000 mg | ORAL_TABLET | Freq: Three times a day (TID) | ORAL | 0 refills | Status: DC
  Filled 2023-11-13: qty 90, 30d supply, fill #0

## 2023-11-15 DIAGNOSIS — N184 Chronic kidney disease, stage 4 (severe): Secondary | ICD-10-CM | POA: Diagnosis not present

## 2023-11-16 ENCOUNTER — Telehealth: Payer: Self-pay

## 2023-11-16 NOTE — Telephone Encounter (Signed)
 PAP: Patient assistance application for Farxiga  through [employer] ([employer]) has been mailed to pt's home address on file. Provider portion of application will be faxed to provider's office.For renewal 2026.

## 2023-11-17 ENCOUNTER — Other Ambulatory Visit (HOSPITAL_COMMUNITY): Payer: Self-pay

## 2023-11-20 DIAGNOSIS — I509 Heart failure, unspecified: Secondary | ICD-10-CM | POA: Diagnosis not present

## 2023-11-20 DIAGNOSIS — I272 Pulmonary hypertension, unspecified: Secondary | ICD-10-CM | POA: Diagnosis not present

## 2023-11-20 DIAGNOSIS — J9610 Chronic respiratory failure, unspecified whether with hypoxia or hypercapnia: Secondary | ICD-10-CM | POA: Diagnosis not present

## 2023-11-21 DIAGNOSIS — E1122 Type 2 diabetes mellitus with diabetic chronic kidney disease: Secondary | ICD-10-CM | POA: Diagnosis not present

## 2023-11-21 DIAGNOSIS — N2581 Secondary hyperparathyroidism of renal origin: Secondary | ICD-10-CM | POA: Diagnosis not present

## 2023-11-21 DIAGNOSIS — D631 Anemia in chronic kidney disease: Secondary | ICD-10-CM | POA: Diagnosis not present

## 2023-11-21 DIAGNOSIS — N184 Chronic kidney disease, stage 4 (severe): Secondary | ICD-10-CM | POA: Diagnosis not present

## 2023-11-21 DIAGNOSIS — I129 Hypertensive chronic kidney disease with stage 1 through stage 4 chronic kidney disease, or unspecified chronic kidney disease: Secondary | ICD-10-CM | POA: Diagnosis not present

## 2023-11-21 DIAGNOSIS — I5032 Chronic diastolic (congestive) heart failure: Secondary | ICD-10-CM | POA: Diagnosis not present

## 2023-11-21 DIAGNOSIS — R809 Proteinuria, unspecified: Secondary | ICD-10-CM | POA: Diagnosis not present

## 2023-11-23 ENCOUNTER — Other Ambulatory Visit (HOSPITAL_COMMUNITY): Payer: Self-pay

## 2023-11-23 ENCOUNTER — Telehealth: Payer: Self-pay | Admitting: Family Medicine

## 2023-11-23 NOTE — Telephone Encounter (Signed)
 Copied from CRM [id number]. Topic: Clinical - Medication Question >> Nov 23, 2023 12:44 PM [name] wrote: Reason for CRM: Patient called in requesting if more refills of her dapagliflozin  propanediol (FARXIGA ) 5 MG TABS tablet could get sent to the manufacturer. Patient clarified that she does not need a refill currently as she just got a 90 day supply, but asking if refills could get sent for 2026.

## 2023-11-23 NOTE — Telephone Encounter (Signed)
 PAP: Patient assistance application for Farxiga  has been approved by PAP Companies: [employer] from 02/08/2024 to 02/06/2025. Medication should be delivered to PAP Delivery: Home. For further shipping updates, please contact [employer] ([employer]) at [phone]. Patient ID is: [id number]

## 2023-11-24 ENCOUNTER — Other Ambulatory Visit: Payer: Self-pay

## 2023-11-24 ENCOUNTER — Other Ambulatory Visit (HOSPITAL_COMMUNITY): Payer: Self-pay

## 2023-11-24 ENCOUNTER — Telehealth: Payer: Self-pay

## 2023-11-24 DIAGNOSIS — N184 Chronic kidney disease, stage 4 (severe): Secondary | ICD-10-CM

## 2023-11-24 DIAGNOSIS — E1122 Type 2 diabetes mellitus with diabetic chronic kidney disease: Secondary | ICD-10-CM

## 2023-11-24 MED ORDER — DAPAGLIFLOZIN PROPANEDIOL 5 MG PO TABS
5.0000 mg | ORAL_TABLET | Freq: Every day | ORAL | 2 refills | Status: DC
  Filled 2023-11-24: qty 90, 90d supply, fill #0

## 2023-11-24 NOTE — Telephone Encounter (Signed)
 Completed refill form for Ozempic  and faxed to provider for review and signature.

## 2023-11-25 DIAGNOSIS — I509 Heart failure, unspecified: Secondary | ICD-10-CM | POA: Diagnosis not present

## 2023-11-25 DIAGNOSIS — J9610 Chronic respiratory failure, unspecified whether with hypoxia or hypercapnia: Secondary | ICD-10-CM | POA: Diagnosis not present

## 2023-11-27 ENCOUNTER — Other Ambulatory Visit (HOSPITAL_COMMUNITY): Payer: Self-pay

## 2023-11-28 ENCOUNTER — Other Ambulatory Visit: Payer: Self-pay

## 2023-11-28 ENCOUNTER — Other Ambulatory Visit (HOSPITAL_COMMUNITY): Payer: Self-pay

## 2023-11-29 NOTE — Progress Notes (Signed)
   11/29/2023  Patient ID: Stacey Page, female   DOB: Sep 21, 1950, 73 y.o.   MRN: [id number]  This patient is appearing on a report for being at risk of failing the adherence measure for diabetes medications this calendar year.   Medication: Ozempic  1 mg/dose (4 mg/73mL pen) Last fill date: 10/10/23 for 84 day supply  Insurance report was not up to date. No action needed at this time.   Jerman Tinnon C. Xia Stohr [facility] PharmD Candidate Class of [phone]

## 2023-11-30 ENCOUNTER — Other Ambulatory Visit (HOSPITAL_COMMUNITY): Payer: Self-pay

## 2023-12-02 ENCOUNTER — Other Ambulatory Visit (HOSPITAL_COMMUNITY): Payer: Self-pay

## 2023-12-06 ENCOUNTER — Ambulatory Visit (INDEPENDENT_AMBULATORY_CARE_PROVIDER_SITE_OTHER): Admitting: Adult Health

## 2023-12-06 ENCOUNTER — Ambulatory Visit: Attending: Physician Assistant | Admitting: Physician Assistant

## 2023-12-06 ENCOUNTER — Ambulatory Visit: Admitting: Cardiology

## 2023-12-06 ENCOUNTER — Ambulatory Visit: Admitting: Nurse Practitioner

## 2023-12-06 ENCOUNTER — Other Ambulatory Visit: Payer: Self-pay

## 2023-12-06 ENCOUNTER — Other Ambulatory Visit (HOSPITAL_COMMUNITY): Payer: Self-pay

## 2023-12-06 ENCOUNTER — Encounter: Payer: Self-pay | Admitting: Physician Assistant

## 2023-12-06 VITALS — BP 125/73 | HR 62 | Temp 97.7°F | Ht 66.0 in | Wt 305.0 lb

## 2023-12-06 VITALS — BP 128/78 | HR 62 | Ht 68.0 in | Wt 311.6 lb

## 2023-12-06 DIAGNOSIS — I350 Nonrheumatic aortic (valve) stenosis: Secondary | ICD-10-CM | POA: Diagnosis not present

## 2023-12-06 DIAGNOSIS — I10 Essential (primary) hypertension: Secondary | ICD-10-CM | POA: Diagnosis not present

## 2023-12-06 DIAGNOSIS — E669 Obesity, unspecified: Secondary | ICD-10-CM

## 2023-12-06 DIAGNOSIS — E1159 Type 2 diabetes mellitus with other circulatory complications: Secondary | ICD-10-CM | POA: Diagnosis not present

## 2023-12-06 DIAGNOSIS — Z7985 Long-term (current) use of injectable non-insulin antidiabetic drugs: Secondary | ICD-10-CM

## 2023-12-06 DIAGNOSIS — I5032 Chronic diastolic (congestive) heart failure: Secondary | ICD-10-CM

## 2023-12-06 DIAGNOSIS — N184 Chronic kidney disease, stage 4 (severe): Secondary | ICD-10-CM

## 2023-12-06 DIAGNOSIS — E559 Vitamin D deficiency, unspecified: Secondary | ICD-10-CM

## 2023-12-06 DIAGNOSIS — Z7984 Long term (current) use of oral hypoglycemic drugs: Secondary | ICD-10-CM

## 2023-12-06 DIAGNOSIS — I152 Hypertension secondary to endocrine disorders: Secondary | ICD-10-CM | POA: Diagnosis not present

## 2023-12-06 DIAGNOSIS — Z6841 Body Mass Index (BMI) 40.0 and over, adult: Secondary | ICD-10-CM

## 2023-12-06 DIAGNOSIS — E1122 Type 2 diabetes mellitus with diabetic chronic kidney disease: Secondary | ICD-10-CM

## 2023-12-06 MED ORDER — HYDRALAZINE HCL 25 MG PO TABS
25.0000 mg | ORAL_TABLET | Freq: Three times a day (TID) | ORAL | 3 refills | Status: AC
  Filled 2023-12-06: qty 90, 30d supply, fill #0
  Filled 2024-01-11: qty 90, 30d supply, fill #1
  Filled 2024-02-14: qty 90, 30d supply, fill #2

## 2023-12-06 NOTE — Progress Notes (Signed)
 Cardiology Office Note   Date:  12/06/2023  ID:  Stacey Page, DOB [birth year], MRN [id number] PCP: Berneta Elsie Sayre, MD  [facility] Providers Cardiologist:  Alm Clay, MD Cardiology APP:  Madie Jon Garre, [place]     History of Present Illness Aryona Sill is a 73 y.o. female with PMH of hypertension, hyperlipidemia, DM2, pulmonary hypertension class III, stage III/IV CKD, OSA on CPAP, morbid obesity with BMI 47, obesity hypoventilation syndrome and reactive airway disease.  Patient was admitted in September 2023 for acute on chronic heart failure due to medication noncompliance.  Right heart cath at the time revealed of mild pulmonary hypertension with mean PA pressure 28 mmHg, suspect to be WHO group 3 pulmonary hypertension.  She is being followed by Dr. Jerrye of nephrology service.  Cardiopulmonary stress test obtained on 06/21/2021 demonstrated normal functional capacity compared to matched sedentary norms, no clear cardiopulmonary limitation however there was evidence of chronotropic incompetence.  The beta-blocker was reduced.  Echocardiogram obtained in September 2023 showed EF 70 to 75%, moderate aortic stenosis.  Repeat echocardiogram obtained on 11/07/2022 showed EF 55 to 60%, no regional wall motion abnormality, grade 1 DD, trivial MR, mild to moderate aortic stenosis.  Patient was last seen by Dr. Clay in September 2024 at which time she was doing well.  Her major issue at the time was poor sleep hygiene.  She is sleepy a lot during the day and does not sleep much at night.  She was also being set up with new pulmonologist.   Patient presents today for follow-up.  She denies any recent chest pain or shortness of breath.  She does not do much activity.  Her daughter does majority of the grocery shopping.  She has been on Farxiga  for the past 6 months and also uses Ozempic .  She has successful weight loss on Ozempic .  Her sleep hygiene also improved.  Based on  the recent blood work obtained by her nephrologist, creatinine was stable at 1.8.  She is on 80 mg of Lasix  3 times a week with as needed dose of Lasix  in between.  She appears to be euvolemic on exam.  On physical exam, she also has 3 out of 6 heart murmur in the aortic valve area.  Previous echocardiogram in 2024 showed aortic stenosis is mild to moderate range.  She will need a repeat echocardiogram in 6 months prior to her next follow-up with Dr. Clay.  Her nephrologist note suggested to reduce hydralazine  dosage.  I will decrease hydralazine  to 25 mg 3 times daily dosing.  If systolic blood pressure remains lower than 140 mmHg, she may even reduce the hydralazine  further down to 25 mg twice a day in 1 month.  I would prefer to keep her systolic blood pressure in the 120-130s range, she is aware to contact cardiology service if her systolic blood pressure began to go into the 140s or higher.   ROS:   She denies chest pain, palpitations, dyspnea, pnd, orthopnea, n, v, dizziness, syncope, edema, weight gain, or early satiety. All other systems reviewed and are otherwise negative except as noted above.    Studies Reviewed      Cardiac Studies & Procedures   ______________________________________________________________________________________________ CARDIAC CATHETERIZATION  CARDIAC CATHETERIZATION 10/15/2021  Conclusion   Hemodynamic findings consistent with mild pulmonary hypertension.  CONCLUSIONS: Mild pulmonary hypertension with mean PA pressure 28 mmHg, capillary wedge pressure mean 13 mmHg, and pulmonary vascular resistance 2.73 Wood units.  Pulmonary hypertension etiology suspected  to be WHO group 3. Right atrial pressure is normal with mean of 6 mmHg. Cardiac output 5.5 L/min, index 2.17 L/min/m.     ECHOCARDIOGRAM  ECHOCARDIOGRAM COMPLETE 11/07/2022  Narrative ECHOCARDIOGRAM REPORT    Patient Name:   Stacey Page Date of Exam: 11/07/2022 Medical Rec #:  [id number]         Height:       68.0 in Accession #:    [id number]       Weight:       336.2 lb Date of Birth:  11-16-1950       BSA:          2.549 m Patient Age:    71 years         BP:           155/72 mmHg Patient Gender: F                HR:           59 bpm. Exam Location:  [address]  Procedure: 2D Echo, Cardiac Doppler and Color Doppler  Indications:    I35.0 Aortic Stenosis  History:        Patient has prior history of Echocardiogram examinations, most recent 10/10/2021. CHF; Risk Factors:Hypertension and HLD.  Sonographer:    Waldo Guadalajara RCS Referring Phys: 60 DAVID W HARDING  IMPRESSIONS   1. Left ventricular ejection fraction, by estimation, is 55 to 60%. The left ventricle has normal function. The left ventricle has no regional wall motion abnormalities. There is moderate concentric left ventricular hypertrophy. Left ventricular diastolic parameters are consistent with Grade I diastolic dysfunction (impaired relaxation). 2. Right ventricular systolic function is normal. The right ventricular size is normal. Tricuspid regurgitation signal is inadequate for assessing PA pressure. 3. The mitral valve is normal in structure. Trivial mitral valve regurgitation. No evidence of mitral stenosis. 4. The aortic valve is tricuspid. There is severe calcifcation of the aortic valve. Aortic valve regurgitation is not visualized. Mild to moderate aortic valve stenosis (visually moderate). Aortic valve area, by VTI measures 1.47 cm. Aortic valve mean gradient measures 13.0 mmHg. 5. The inferior vena cava is normal in size with greater than 50% respiratory variability, suggesting right atrial pressure of 3 mmHg.  FINDINGS Left Ventricle: Left ventricular ejection fraction, by estimation, is 55 to 60%. The left ventricle has normal function. The left ventricle has no regional wall motion abnormalities. The left ventricular internal cavity size was normal in size. There is moderate concentric left  ventricular hypertrophy. Left ventricular diastolic parameters are consistent with Grade I diastolic dysfunction (impaired relaxation).  Right Ventricle: The right ventricular size is normal. No increase in right ventricular wall thickness. Right ventricular systolic function is normal. Tricuspid regurgitation signal is inadequate for assessing PA pressure.  Left Atrium: Left atrial size was normal in size.  Right Atrium: Right atrial size was normal in size.  Pericardium: Trivial pericardial effusion is present.  Mitral Valve: The mitral valve is normal in structure. Mild to moderate mitral annular calcification. Trivial mitral valve regurgitation. No evidence of mitral valve stenosis.  Tricuspid Valve: The tricuspid valve is normal in structure. Tricuspid valve regurgitation is not demonstrated.  Aortic Valve: The aortic valve is tricuspid. There is severe calcifcation of the aortic valve. Aortic valve regurgitation is not visualized. Mild to moderate aortic stenosis is present. Aortic valve mean gradient measures 13.0 mmHg. Aortic valve peak gradient measures 23.8 mmHg. Aortic valve area, by VTI measures 1.47 cm.  Pulmonic  Valve: The pulmonic valve was normal in structure. Pulmonic valve regurgitation is not visualized.  Aorta: The aortic root is normal in size and structure.  Venous: The inferior vena cava is normal in size with greater than 50% respiratory variability, suggesting right atrial pressure of 3 mmHg.  IAS/Shunts: No atrial level shunt detected by color flow Doppler.   LEFT VENTRICLE PLAX 2D LVIDd:         5.60 cm   Diastology LVIDs:         3.70 cm   LV e' medial:    3.70 cm/s LV PW:         1.30 cm   LV E/e' medial:  22.9 LV IVS:        1.50 cm   LV e' lateral:   5.55 cm/s LVOT diam:     2.00 cm   LV E/e' lateral: 15.3 LV SV:         90 LV SV Index:   35 LVOT Area:     3.14 cm   RIGHT VENTRICLE RV Basal diam:  3.50 cm RV S prime:     10.90 cm/s TAPSE  (M-mode): 2.4 cm  LEFT ATRIUM             Index        RIGHT ATRIUM          Index LA diam:        4.70 cm 1.84 cm/m   RA Area:     9.65 cm LA Vol (A2C):   58.4 ml 22.92 ml/m  RA Volume:   19.20 ml 7.53 ml/m LA Vol (A4C):   73.5 ml 28.84 ml/m LA Biplane Vol: 68.2 ml 26.76 ml/m AORTIC VALVE AV Area (Vmax):    1.44 cm AV Area (Vmean):   1.44 cm AV Area (VTI):     1.47 cm AV Vmax:           244.00 cm/s AV Vmean:          167.000 cm/s AV VTI:            0.611 m AV Peak Grad:      23.8 mmHg AV Mean Grad:      13.0 mmHg LVOT Vmax:         112.00 cm/s LVOT Vmean:        76.500 cm/s LVOT VTI:          0.285 m LVOT/AV VTI ratio: 0.47  AORTA Ao Root diam: 3.10 cm Ao Asc diam:  3.70 cm  MITRAL VALVE MV Area (PHT):             SHUNTS MV Decel Time:             Systemic VTI:  0.29 m MV E velocity: 84.90 cm/s  Systemic Diam: 2.00 cm MV A velocity: 96.90 cm/s MV E/A ratio:  0.88  Dalton McleanMD Electronically signed by Ezra Kanner Signature Date/Time: 11/07/2022/12:34:29 PM    Final          ______________________________________________________________________________________________      Risk Assessment/Calculations           Physical Exam VS:  BP 128/78   Pulse 62   Ht 5' 8 (1.727 m)   Wt (!) 311 lb 9.6 oz (141.3 kg)   SpO2 95%   BMI 47.38 kg/m        Wt Readings from Last 3 Encounters:  12/06/23 (!) 311 lb 9.6 oz (141.3 kg)  11/07/23 (!) 307 lb (  139.3 kg)  10/05/23 (!) 306 lb (138.8 kg)    GEN: Well nourished, well developed in no acute distress NECK: No JVD; No carotid bruits CARDIAC: RRR, no murmurs, rubs, gallops RESPIRATORY:  Clear to auscultation without rales, wheezing or rhonchi  ABDOMEN: Soft, non-tender, non-distended EXTREMITIES:  No edema; No deformity   ASSESSMENT AND PLAN  Chronic diastolic heart failure: Euvolemic on exam.  The patient has been started on Farxiga  for the past 6 months.  Renal function is stable.  Moderate  aortic stenosis: Plan to repeat echocardiogram prior to the next visit with Dr. Anner in 6 months  Hypertension: Blood pressure well-controlled on current therapy.  Based on nephrology note that was faxed over, her nephrologist recommended to decrease hydralazine  dosage.  Will decrease hydralazine  to 25 mg 3 times a day.  If systolic blood pressure remain less than 140 after a month, she can potentially reduce the hydralazine  further down to 25 mg twice a day.  CKD stage IV: Followed by Dr. Jerrye of nephrology service.       Dispo: Follow-up with Dr. Anner in 6 months  Signed, Jermiah Howton, [place]

## 2023-12-06 NOTE — Progress Notes (Signed)
 WEIGHT SUMMARY AND BIOMETRICS  Vitals Temp: 97.7 F (36.5 C) BP: 125/73 Pulse Rate: 62 SpO2: 92 %   Anthropometric Measurements Height: 5' 6 (1.676 m) (reck) Weight: (!) 305 lb (138.3 kg) BMI (Calculated): 49.25 Weight at Last Visit: 307 lb Weight Lost Since Last Visit: 2 Weight Gained Since Last Visit: 0 Starting Weight: 344 lb Total Weight Loss (lbs): 39 lb (17.7 kg)   Body Composition  Body Fat %: 55.8 % Fat Mass (lbs): 170.4 lbs Muscle Mass (lbs): 128 lbs Total Body Water  (lbs): 100.4 lbs Visceral Fat Rating : 23   Other Clinical Data Fasting: yes Labs: no Today's Visit #: 38 Starting Date: 03/25/19    Chief Complaint:   OBESITY Stacey Page is here to discuss her progress with her obesity treatment plan.  She is on the the Category 2 Plan and states she is following her eating plan approximately 50 % of the time.  She states she is exercising daily walking.  Interim History:  Stacey Page had chronic f/u with Cards today- Rx Adjustment: Hydralazine  25mg  TID- if SBP consistently <140 for several months, then she can reduce from TID to BID She has ECHO scheduled in 6 months, then virtual OV with Dr. Anner  She endorses increased stamina and less need for supplemental O2  She is VERY excited for her upcoming [place] cruise with her extended family members (approx 25 ppl in the party).  Subjective:   1. Hypertension associated with diabetes (HCC) BP excellent at OV She had chronic f/u with Cards today- Rx Adjustment: Hydralazine  25mg  TID- if SBP consistently <140 for several months, then she can reduce from TID to BID She has ECHO scheduled in 6 months, then virtual OV with Dr. Anner  2. Type 2 diabetes mellitus with stage 4 chronic kidney disease, without long-term current use of insulin  (HCC) She is on daily Farxiga  5 and weekly Ozempic  1mg  Fasting CBG will range from 117-144, most readings <130s She denies sx's of hypoglycemia Denies mass  in neck, dysphagia, dyspepsia, persistent hoarseness, abdominal pain, or N/V/C   Patient was counseled on the importance of maintaining healthy lifestyle habits, including balanced nutrition, regular physical activity, and behavioral modifications, while taking antiobesity medication.   Patient verbalized understanding that medication is an adjunct to, not a replacement for, lifestyle changes and that the long-term success and weight maintenance depend on continued adherence to these strategies.  3. Stage 4 chronic kidney disease (HCC)  Latest Reference Range & Units 04/18/23 10:41 05/02/23 09:20 10/05/23 12:32  Creatinine 0.57 - 1.00 mg/dL 7.70 (H) 7.98 (H) 8.22 (H)  (H): Data is abnormally high  Latest Reference Range & Units 05/02/23 09:20 09/28/23 00:00 10/05/23 12:32  eGFR >59 mL/min/1.73 26 (L) 29.0 30 (L)  (L): Data is abnormally low  CKD stable and improving  4. Vitamin D  deficiency  Latest Reference Range & Units 12/12/22 15:47 05/02/23 09:20 10/05/23 12:32  Vitamin D , 25-Hydroxy 30.0 - 100.0 ng/mL 36.8 41.7 36.0   She is on daily OTC Vit D3 2,000 international units   Assessment/Plan:   1. Hypertension associated with diabetes (HCC) Continue healthy eating and daily walking. Continue current antihypertensive per Cards  2. Type 2 diabetes mellitus with stage 4 chronic kidney disease, without long-term current use of insulin  (HCC) (Primary) Patient was counseled on the importance of maintaining healthy lifestyle habits, including balanced nutrition, regular physical activity, and behavioral modifications, while taking antiobesity medication.   Patient verbalized understanding that medication is an adjunct  to, not a replacement for, lifestyle changes and that the long-term success and weight maintenance depend on continued adherence to these strategies.  3. Stage 4 chronic kidney disease (HCC) Continue f/u with Nephrology as directed/PRN Continue to avoid Nephrotoxic  substances  4. Vitamin D  deficiency Continue daily OTC Vit D3 2,000 international units   5. Current BMI 49.3  Stacey Page is currently in the action stage of change. As such, her goal is to continue with weight loss efforts. She has agreed to the Category 2 Plan.   Exercise goals: Older adults should follow the adult guidelines. When older adults cannot meet the adult guidelines, they should be as physically active as their abilities and conditions will allow.  Older adults should do exercises that maintain or improve balance if they are at risk of falling.  Older adults should determine their level of effort for physical activity relative to their level of fitness.  Older adults with chronic conditions should understand whether and how their conditions affect their ability to do regular physical activity safely.  Behavioral modification strategies: increasing lean protein intake, decreasing simple carbohydrates, increasing vegetables, increasing water  intake, decreasing eating out, meal planning and cooking strategies, keeping healthy foods in the home, ways to avoid boredom eating, ways to avoid night time snacking, travel eating strategies, celebration eating strategies, and planning for success.  Stacey Page has agreed to follow-up with our clinic in 4 weeks. She was informed of the importance of frequent follow-up visits to maximize her success with intensive lifestyle modifications for her multiple health conditions.   Objective:   Blood pressure 125/73, pulse 62, temperature 97.7 F (36.5 C), height 5' 6 (1.676 m), weight (!) 305 lb (138.3 kg), SpO2 92%. Body mass index is 49.23 kg/m.  General: Cooperative, alert, well developed, in no acute distress. HEENT: Conjunctivae and lids unremarkable. Cardiovascular: Regular rhythm.  Lungs: Normal work of breathing. Neurologic: No focal deficits.   Lab Results  Component Value Date   CREATININE 1.77 (H) 10/05/2023   BUN 19 10/05/2023    NA 140 10/05/2023   K 4.2 10/05/2023   CL 100 10/05/2023   CO2 24 10/05/2023   Lab Results  Component Value Date   ALT 9 10/05/2023   AST 11 10/05/2023   ALKPHOS 78 10/05/2023   BILITOT 0.4 10/05/2023   Lab Results  Component Value Date   HGBA1C 5.7 (H) 10/05/2023   HGBA1C 5.7 (H) 05/02/2023   HGBA1C 6.0 04/18/2023   HGBA1C 6.2 (H) 12/12/2022   HGBA1C 6.5 07/14/2022   Lab Results  Component Value Date   INSULIN  8.4 10/05/2023   INSULIN  6.9 05/02/2023   INSULIN  10.8 12/12/2022   INSULIN  8.6 10/28/2019   INSULIN  9.2 07/04/2019   Lab Results  Component Value Date   TSH 0.644 10/05/2023   Lab Results  Component Value Date   CHOL 144 12/12/2022   HDL 63 12/12/2022   LDLCALC 68 12/12/2022   TRIG 62 12/12/2022   CHOLHDL 2.3 12/12/2022   Lab Results  Component Value Date   VD25OH 36.0 10/05/2023   VD25OH 41.7 05/02/2023   VD25OH 36.8 12/12/2022   Lab Results  Component Value Date   WBC 4.3 02/03/2023   HGB 13.5 02/03/2023   HCT 42.3 02/03/2023   MCV 92.6 02/03/2023   PLT 206 02/03/2023   Lab Results  Component Value Date   IRON 51 02/03/2023   TIBC 266 02/03/2023   FERRITIN 70 02/03/2023   Attestation Statements:   Reviewed by clinician on  day of visit: allergies, medications, problem list, medical history, surgical history, family history, social history, and previous encounter notes.  Time spent on visit including pre-visit chart review and post-visit care and charting was 27 minutes.   I have reviewed the above documentation for accuracy and completeness, and I agree with the above. -  Drucella Karbowski d. Callum Wolf, NP-C

## 2023-12-06 NOTE — Patient Instructions (Signed)
 Medication Instructions:  Reduce Hydralazine  25 mg three times daily. Once your systolic BP (top number) remains under 140 for a couple of months, you can take Hydralazine  25 mg twice daily.   *If you need a refill on your cardiac medications before your next appointment, please call your pharmacy*  Lab Work: NONE ordered at this time of appointment   Testing/Procedures: Your physician has requested that you have an echocardiogram in 6 months. Echocardiography is a painless test that uses sound waves to create images of your heart. It provides your doctor with information about the size and shape of your heart and how well your heart's chambers and valves are working. This procedure takes approximately one hour. There are no restrictions for this procedure. Please do NOT wear cologne, perfume, aftershave, or lotions (deodorant is allowed). Please arrive 15 minutes prior to your appointment time.  Please note: We ask at that you not bring children with you during ultrasound (echo/ vascular) testing. Due to room size and safety concerns, children are not allowed in the ultrasound rooms during exams. Our front office staff cannot provide observation of children in our lobby area while testing is being conducted. An adult accompanying a patient to their appointment will only be allowed in the ultrasound room at the discretion of the ultrasound technician under special circumstances. We apologize for any inconvenience.   Follow-Up: At [facility], you and your health needs are our priority.  As part of our continuing mission to provide you with exceptional heart care, our providers are all part of one team.  This team includes your primary Cardiologist (physician) and Advanced Practice Providers or APPs (Physician Assistants and Nurse Practitioners) who all work together to provide you with the care you need, when you need it.  Your next appointment:    6 months follow up 1-2 weeks after  echocardiogram  Provider:   Alm Clay, MD    We recommend signing up for the patient portal called MyChart.  Sign up information is provided on this After Visit Summary.  MyChart is used to connect with patients for Virtual Visits (Telemedicine).  Patients are able to view lab/test results, encounter notes, upcoming appointments, etc.  Non-urgent messages can be sent to your provider as well.   To learn more about what you can do with MyChart, go to [email or web address].

## 2023-12-11 ENCOUNTER — Other Ambulatory Visit (HOSPITAL_COMMUNITY): Payer: Self-pay

## 2023-12-20 ENCOUNTER — Other Ambulatory Visit (HOSPITAL_COMMUNITY): Payer: Self-pay

## 2023-12-20 ENCOUNTER — Other Ambulatory Visit: Payer: Self-pay

## 2023-12-21 DIAGNOSIS — I272 Pulmonary hypertension, unspecified: Secondary | ICD-10-CM | POA: Diagnosis not present

## 2023-12-21 DIAGNOSIS — J9610 Chronic respiratory failure, unspecified whether with hypoxia or hypercapnia: Secondary | ICD-10-CM | POA: Diagnosis not present

## 2023-12-21 DIAGNOSIS — I509 Heart failure, unspecified: Secondary | ICD-10-CM | POA: Diagnosis not present

## 2023-12-22 ENCOUNTER — Ambulatory Visit: Payer: Self-pay

## 2023-12-22 DIAGNOSIS — M5459 Other low back pain: Secondary | ICD-10-CM | POA: Diagnosis not present

## 2023-12-22 NOTE — Telephone Encounter (Signed)
 Noted. Dm/cma

## 2023-12-22 NOTE — Telephone Encounter (Signed)
 FYI Only or Action Required?: Action required by provider: request for appointment.  Patient was last seen in primary care on 12/06/2023 by Jonel Rockie BIRCH, NP.  Called Nurse Triage reporting Hip Pain.  Symptoms began several days ago.  Interventions attempted: Nothing.  Symptoms are: unchanged.  Triage Disposition: See PCP When Office is Open (Within 3 Days)  Patient/caregiver understands and will follow disposition?: Yes      Copied from CRM #[id number]. Topic: Clinical - Red Word Triage >> Dec 22, 2023  3:39 PM Sasha M wrote: Red Word that prompted transfer to Nurse Triage: severe pain in right hip Reason for Disposition  [1] MODERATE pain (e.g., interferes with normal activities, limping) AND [2] present > 3 days  Answer Assessment - Initial Assessment Questions 1. LOCATION and RADIATION: Where is the pain located? Does the pain spread (shoot) anywhere else?     right 2. QUALITY: What does the pain feel like?  (e.g., sharp, dull, aching, burning)     cramp 3. SEVERITY: How bad is the pain? What does it keep you from doing?   (Scale 1-10; or mild, moderate, severe)     11 4. ONSET: When did the pain start? Does it come and go, or is it there all the time?     Comes and goes 5. WORK OR EXERCISE: Has there been any recent work or exercise that involved this part of the body?      no 6. CAUSE: What do you think is causing the hip pain?      unsure 7. AGGRAVATING FACTORS: What makes the hip pain worse? (e.g., walking, climbing stairs, running)     walking 8. OTHER SYMPTOMS: Do you have any other symptoms? (e.g., back pain, pain shooting down leg,  fever, rash)     no  Protocols used: Hip Pain-A-AH

## 2023-12-25 ENCOUNTER — Other Ambulatory Visit: Payer: Self-pay

## 2023-12-25 ENCOUNTER — Other Ambulatory Visit (HOSPITAL_COMMUNITY): Payer: Self-pay

## 2023-12-25 ENCOUNTER — Encounter: Payer: Self-pay | Admitting: Emergency Medicine

## 2023-12-25 ENCOUNTER — Ambulatory Visit: Admitting: Emergency Medicine

## 2023-12-25 VITALS — BP 152/86 | HR 62 | Temp 98.0°F | Resp 16 | Ht 66.0 in | Wt 311.0 lb

## 2023-12-25 DIAGNOSIS — G8929 Other chronic pain: Secondary | ICD-10-CM | POA: Diagnosis not present

## 2023-12-25 DIAGNOSIS — I10 Essential (primary) hypertension: Secondary | ICD-10-CM

## 2023-12-25 DIAGNOSIS — M545 Low back pain, unspecified: Secondary | ICD-10-CM

## 2023-12-25 DIAGNOSIS — E1122 Type 2 diabetes mellitus with diabetic chronic kidney disease: Secondary | ICD-10-CM

## 2023-12-25 DIAGNOSIS — S32050A Wedge compression fracture of fifth lumbar vertebra, initial encounter for closed fracture: Secondary | ICD-10-CM | POA: Diagnosis not present

## 2023-12-25 DIAGNOSIS — N184 Chronic kidney disease, stage 4 (severe): Secondary | ICD-10-CM

## 2023-12-25 MED ORDER — CYCLOBENZAPRINE HCL 5 MG PO TABS: 2.5000 mg | ORAL_TABLET | Freq: Three times a day (TID) | ORAL | 0 refills | Status: AC | PRN

## 2023-12-25 MED ORDER — DAPAGLIFLOZIN PROPANEDIOL 5 MG PO TABS: 5.0000 mg | ORAL_TABLET | Freq: Every day | ORAL | 2 refills | Status: AC

## 2023-12-25 MED ORDER — TIZANIDINE HCL 2 MG PO TABS
2.0000 mg | ORAL_TABLET | Freq: Two times a day (BID) | ORAL | 0 refills | Status: AC | PRN
  Filled 2023-12-25: qty 14, 7d supply, fill #0

## 2023-12-25 NOTE — Patient Instructions (Addendum)
 Do not take cyclobenzaprine (flexeril) for 2 hours after your potassium pill.  As discussed, I recommend taking 1/2-1 whole tablet of cyclobenzaprine no more than 3 times a day, and I do suggest starting out taking it with acetaminophen , 500 mg (which is 1 extra strength tablet) Gentle stretches as your pain allows Heat when you are able to relax the muscles Orthopedics should call you within the next 1-2 business days for follow-up.   Dr. Eldonna does physical medicine and rehab and has some good background expertise in spine management, if he is available. However, I would like you to see whichever spine/back physician is available at the clinic.  Go to the emergency department if you are not unable to control your bowels or bladder, have numbness in the groin, or unable to feel or move your legs.

## 2023-12-25 NOTE — Progress Notes (Signed)
 Assessment & Plan:   Assessment & Plan Closed compression fracture of L5 vertebra, initial encounter (HCC) There is no prior imaging within our system for comparison to determine the age of this compression fracture though her pain at this time is more consistent with muscular strain than acute lumbar compression fracture.  However, given this finding and her recurring back pain I do feel that a close follow-up with orthopedics is recommended and an urgent referral was placed. Orders:   AMB referral to orthopedics  Chronic low back pain without sciatica, unspecified back pain laterality Likely muscular.  No signs of cauda equina.  We discussed no renal dosage adjustment is required for cyclobenzaprine and she would rather take this than tizanidine so prescription is provided with instructions as below Orders:   cyclobenzaprine (FLEXERIL) 5 MG tablet; Take 0.5-1 tablets (2.5-5 mg total) by mouth 3 (three) times daily as needed for muscle spasms.  Essential hypertension Continue to monitor.  Follow-up with her primary provider should there be persistent elevation.  We reviewed that the vascular plaques seen on her imaging at orthopedics is likely her baseline and does not require acute intervention.     Patient Instructions  Do not take cyclobenzaprine (flexeril) for 2 hours after your potassium pill.  As discussed, I recommend taking 1/2-1 whole tablet of cyclobenzaprine no more than 3 times a day, and I do suggest starting out taking it with acetaminophen , 500 mg (which is 1 extra strength tablet) Gentle stretches as your pain allows Heat when you are able to relax the muscles Orthopedics should call you within the next 1-2 business days for follow-up.   Dr. Eldonna does physical medicine and rehab and has some good background expertise in spine management, if he is available. However, I would like you to see whichever spine/back physician is available at the clinic.  Go to the emergency  department if you are not unable to control your bowels or bladder, have numbness in the groin, or unable to feel or move your legs.   Stacey Page, MSPAS, PA-C    Subjective:  Back Pain (Back pain x 5 days. Painful to walk, sit, stand and lay. Denies any falls or known injury. )    HPI: Stacey Page is a 73 y.o. female with a history of CKD stage IV, type 2 diabetes, congestive heart failure, presenting on 12/25/2023 with report of acute on chronic low back pain.  Current episode started about 5 days ago after [place].  Pain is on the right side, does not radiate into the legs, and is worse with position change or prolonged walking.  Does like a tight muscle and then she gets a shock with certain movements.  She denies bowel or bladder dysfunction, saddle anesthesia, lower extremity weakness or numbness, acute abdominal pain, fevers, dysuria, lower extremity swelling.  She saw [facility] on Friday, who performed an x-ray and advised her she had an age-indeterminate L5 compression fracture as well as visible vascular plaque seen on plain films.  They recommended primary care follow-up and to return to them if she desires.  They prescribed her tizanidine but her pharmacy is not open on the weekends so she has not yet picked it up but wonders if this would be the best muscle relaxer for her.  She has tried heat and gentle movement without relief.  She has not tried any oral medications. She does get back pain quite frequently, the  last episode was about a month ago but this is a bit more severe and is lasting longer than typical for her.   ROS: Negative unless specifically indicated above in HPI.   Relevant past medical history reviewed and updated as indicated.   Allergies and medications reviewed and updated.   Current Outpatient Medications:    Accu-Chek Softclix Lancets lancets, May check glucose up to 3 times daily., Disp: 100 each, Rfl: 12    albuterol  (VENTOLIN  HFA) 108 (90 Base) MCG/ACT inhaler, INHALE 1-2 PUFFS into THE lungs EVERY 6 HOURS AS NEEDED FOR WHEEZING AND/OR SHORTNESS OF BREATH, Disp: 6.7 g, Rfl: 3   aspirin  81 MG tablet, Take 1 tablet (81 mg total) by mouth daily with breakfast., Disp: 90 tablet, Rfl: 0   atorvastatin  (LIPITOR) 20 MG tablet, Take 1 tablet (20 mg total) by mouth daily., Disp: 90 tablet, Rfl: 3   Blood Glucose Monitoring Suppl (ONETOUCH VERIO SYNC SYSTEM) w/Device KIT, 1 kit by Does not apply route daily as needed., Disp: 1 kit, Rfl: 0   carvedilol  (COREG ) 25 MG tablet, Take 1 tablet (25 mg total) by mouth 2 (two) times daily., Disp: 180 tablet, Rfl: 3   cholecalciferol (VITAMIN D3) 25 MCG (1000 UNIT) tablet, Take 2,000 Units by mouth daily., Disp: , Rfl:    cyclobenzaprine (FLEXERIL) 5 MG tablet, Take 0.5-1 tablets (2.5-5 mg total) by mouth 3 (three) times daily as needed for muscle spasms., Disp: 20 tablet, Rfl: 0   dapagliflozin  propanediol (FARXIGA ) 5 MG TABS tablet, Take 1 tablet (5 mg total) by mouth daily before breakfast., Disp: 90 tablet, Rfl: 2   Docusate Sodium  100 MG capsule, Take 100 mg by mouth 2 (two) times daily., Disp: , Rfl:    ferrous sulfate  325 (65 FE) MG tablet, Take 325 mg by mouth 2 (two) times daily with a meal., Disp: , Rfl:    fluticasone  (FLONASE ) 50 MCG/ACT nasal spray, Place 1 spray into both nostrils daily., Disp: 48 g, Rfl: 1   furosemide  (LASIX ) 80 MG tablet, Take 1 (one) Tablet by mouth three times a week and as needed on other days, Disp: 90 tablet, Rfl: 3   gabapentin  (NEURONTIN ) 100 MG capsule, Take 1 capsule (100 mg total) by mouth 2 (two) times daily., Disp: 180 capsule, Rfl: 1   glucose blood (ONETOUCH VERIO) test strip, Use to monitor blood sugar twice daily as instructed, Disp: 200 each, Rfl: 3   hydrALAZINE  (APRESOLINE ) 25 MG tablet, Take 1 tablet (25 mg total) by mouth 3 (three) times daily., Disp: 90 tablet, Rfl: 3   isosorbide  dinitrate (ISORDIL ) 10 MG tablet,  Take 1 tablet (10 mg total) by mouth 3 (three) times daily. Please keep scheduled appointment, Disp: 270 tablet, Rfl: 3   losartan  (COZAAR ) 25 MG tablet, Take 0.5 tablets (12.5 mg total) by mouth daily., Disp: 45 tablet, Rfl: 3   montelukast  (SINGULAIR ) 10 MG tablet, Take 1 tablet (10 mg total) by mouth at bedtime., Disp: 90 tablet, Rfl: 3   pantoprazole  (PROTONIX ) 40 MG tablet, Take 1 tablet (40 mg total) by mouth daily., Disp: 90 tablet, Rfl: 1   potassium chloride  (KLOR-CON  M) 10 MEQ tablet, Take 1 tablet (10 mEq total) by mouth daily., Disp: 60 tablet, Rfl: 2   Semaglutide , 1 MG/DOSE, 4 MG/3ML SOPN, Inject 1 mg as directed once a week., Disp: 9 mL, Rfl: 0  No Known Allergies  Social History   Tobacco Use   Smoking status: Former    Current packs/day: 0.00  Average packs/day: 0.3 packs/day for 15.0 years (3.8 ttl pk-yrs)    Types: Cigarettes    Start date: 02/07/1965    Quit date: 02/08/1980    Years since quitting: 43.9   Smokeless tobacco: Former  Building Services Engineer status: Never Used  Substance Use Topics   Alcohol use: Not Currently    Comment: occassional/social/rare   Drug use: Not Currently     Objective:   Vitals:   12/25/23 0819 12/25/23 0858  BP: (!) 158/74 (!) 152/86  Pulse: 62   Temp: 98 F (36.7 C)   Resp: 16   Height: 5' 6 (1.676 m)   Weight: (!) 311 lb (141.1 kg)   SpO2: 98%   BMI (Calculated): 50.22      Appears well, sitting comfortably in exam chair but grimace with movements is noted. Sclera anicteric Oral mucosa moist Normal resp effort and excursion. There is no midline tenderness to the entire spine.  There is some tenderness elicited over the left upper paralumbar muscles but more pain is noted with patient trunk flexion and extension or position change.  There is no overlying rash or skin discoloration to area of pain.  She has sensation intact to light touch throughout all nerve distributions of bilateral lower extremities.  Strength is  tested and is symmetric 5 out of 5 hip flexion/extension, knee flexion/extension, foot plantar and dorsi flexion, great toe extension.  Gait is antalgic but without ataxia or foot drop

## 2023-12-25 NOTE — Progress Notes (Signed)
 Pt was in office seeing Corean Geralds. Pt states another nurse sent in her Farxiga  to the wrong pharmacy and that it was suppose to be sent to [facility].   Rx was resent on 12/25/23 to: [facility]  [address] [place], [place] [address].  Pharmacy attached to pt chart.

## 2023-12-25 NOTE — Assessment & Plan Note (Signed)
 Continue to monitor.  Follow-up with her primary provider should there be persistent elevation.  We reviewed that the vascular plaques seen on her imaging at orthopedics is likely her baseline and does not require acute intervention.

## 2023-12-26 DIAGNOSIS — I509 Heart failure, unspecified: Secondary | ICD-10-CM | POA: Diagnosis not present

## 2023-12-26 DIAGNOSIS — J9610 Chronic respiratory failure, unspecified whether with hypoxia or hypercapnia: Secondary | ICD-10-CM | POA: Diagnosis not present

## 2024-01-01 ENCOUNTER — Other Ambulatory Visit (HOSPITAL_COMMUNITY): Payer: Self-pay

## 2024-01-02 ENCOUNTER — Other Ambulatory Visit (INDEPENDENT_AMBULATORY_CARE_PROVIDER_SITE_OTHER): Payer: Self-pay

## 2024-01-02 ENCOUNTER — Ambulatory Visit: Admitting: Sports Medicine

## 2024-01-02 ENCOUNTER — Encounter: Payer: Self-pay | Admitting: Sports Medicine

## 2024-01-02 DIAGNOSIS — G8929 Other chronic pain: Secondary | ICD-10-CM | POA: Diagnosis not present

## 2024-01-02 DIAGNOSIS — L600 Ingrowing nail: Secondary | ICD-10-CM | POA: Diagnosis not present

## 2024-01-02 DIAGNOSIS — I70203 Unspecified atherosclerosis of native arteries of extremities, bilateral legs: Secondary | ICD-10-CM | POA: Diagnosis not present

## 2024-01-02 DIAGNOSIS — S32050A Wedge compression fracture of fifth lumbar vertebra, initial encounter for closed fracture: Secondary | ICD-10-CM

## 2024-01-02 DIAGNOSIS — M545 Low back pain, unspecified: Secondary | ICD-10-CM

## 2024-01-02 DIAGNOSIS — M85851 Other specified disorders of bone density and structure, right thigh: Secondary | ICD-10-CM

## 2024-01-02 DIAGNOSIS — M792 Neuralgia and neuritis, unspecified: Secondary | ICD-10-CM | POA: Diagnosis not present

## 2024-01-02 DIAGNOSIS — L84 Corns and callosities: Secondary | ICD-10-CM | POA: Diagnosis not present

## 2024-01-02 DIAGNOSIS — B351 Tinea unguium: Secondary | ICD-10-CM | POA: Diagnosis not present

## 2024-01-02 DIAGNOSIS — M2041 Other hammer toe(s) (acquired), right foot: Secondary | ICD-10-CM | POA: Diagnosis not present

## 2024-01-02 DIAGNOSIS — M5136 Other intervertebral disc degeneration, lumbar region with discogenic back pain only: Secondary | ICD-10-CM

## 2024-01-02 DIAGNOSIS — E1142 Type 2 diabetes mellitus with diabetic polyneuropathy: Secondary | ICD-10-CM | POA: Diagnosis not present

## 2024-01-02 NOTE — Progress Notes (Signed)
 Rilda Bulls - 73 y.o. female MRN [id number]  Date of birth: 08/27/1950  Office Visit Note: Visit Date: 01/02/2024 PCP: Berneta Elsie Sayre, MD Referred by: Waddell Krabbe, PA-C  Subjective: Chief Complaint  Patient presents with   Lower Back - Pain   HPI: Huma Imhoff is a pleasant 73 y.o. female who presents today for acute on chronic low back pain, right sided.  She has had back pain in the past, but the last few weeks had rather significant sharp pain on the right side of the low back. She was seen previously at [facility].  I did independently review orthopedic note from 12/22/2023.  They did obtain x-rays which show diffuse degenerative changes as well as a compression fracture of unknown chronicity at L5.  Did write a prescription for tizanidine  and give activity modification.   Here recently her pain has been improving.  She will transition from tizanidine  to Flexeril  5mg  which she has used BID PRN, which has helped her pain.  She currently is not involved in any formalized physical therapy but is open to this.  No previous history of fragility fracture.  She does follow with Dr. Berneta -last reviewed last DEXA showed osteopenia T-score -1.2.  Pertinent ROS were reviewed with the patient and found to be negative unless otherwise specified above in HPI.   Assessment & Plan: Visit Diagnoses:  1. Chronic low back pain, unspecified back pain laterality, unspecified whether sciatica present   2. Degeneration of intervertebral disc of lumbar region with discogenic back pain   3. Compression fracture of L5 vertebra, initial encounter (HCC)   4. Osteopenia of neck of right femur    Plan: Impression is chronic low back pain with a history of mild L5 compression deformity in the setting of advanced lumbar DDD.  There are no comparison x-rays for compression deformity, but doubt the acuity given symptomatology.  Discussed her lumbar DDD and the associated paraspinal  hypertonicity, which could be addressed with formalized physical therapy.  She is open to this and does prefer [facility] based PT as well.  Referral sent to [facility] for both aquatic and land-based PT to help stabilize the posterior chain and lumbar spine.  She may continue her Flexeril  5 mg twice daily as needed but we discussed tapering off of this given her pain is improving and going to only as needed.  Also discussed the nature of her compression deformity, last DEXA was in 2021 which showed osteopenia.  She does state that her PCP is very on top of her care, she would rather follow-up with him for this which I feel is very reasonable.  I did discuss we have an osteoporosis clinic here to further evaluate with labs and possible tensional medications if deemed needed.  I will let her progress to physical therapy, she will see me back in 6 weeks only if not significantly improved.  Follow-up: Return for 6 weeks only if needed.   Meds & Orders: No orders of the defined types were placed in this encounter.   Orders Placed This Encounter  Procedures   XR Lumbar Spine 2-3 Views   Ambulatory referral to Physical Therapy     Procedures: No procedures performed      Clinical History: No specialty comments available.  She reports that she quit smoking about 43 years ago. Her smoking use included cigarettes. She started smoking about 58 years ago. She has a 3.8 pack-year smoking history. She has quit using smokeless tobacco.  Recent  Labs    04/18/23 1041 05/02/23 0920 10/05/23 1232  HGBA1C 6.0 5.7* 5.7*   Narrative & Impression  EXAM: DUAL X-RAY ABSORPTIOMETRY (DXA) FOR BONE MINERAL DENSITY   IMPRESSION: CHARLOTTE LUM [name]   Your patient Caytlyn Evers completed a BMD test on 09/23/2019 using the Lunar IDXA DXA System (analysis version: 16.SP2) manufactured by [employer]. The following summarizes the results of our evaluation.   PATIENT: Name: Verlinda, Slotnick Patient ID:   [id number]   Birth Date: [birth year] Height:     66.0 in. Gender:      Female   Measured:   09/23/2019 Weight:     340.0 lbs. Indications: African-American, Diabetic, Estrogen Deficiency, Post Menopausal, Previous Tobacco User Fractures: Ankle Treatments: Calcium , Januvia , Vitamin D    ASSESSMENT: The BMD measured at Femur Neck Right is 0.943 g/cm2 with a T-score of -0.7.   This patient is considered NORMAL according to World Health Organization [facility]) criteria.   L1 & L4 were excluded due to degenerative changes.   Compared with the prior study on, the BMD of the total mean shows a statistically significant increase.   The scan quality is good.   Site Region Measured Date Measured Age WHO YA BMD Classification T-score   AP Spine L2-L3 09/23/2019 67.8 Normal 3.3 1.594 g/cm2 AP Spine L2-L3 01/24/2017 65.1 Normal 1.4 1.370 g/cm2   DualFemur Neck Right 09/23/2019 67.8 Normal -0.7 0.943 g/cm2 DualFemur Neck Right 01/24/2017 65.1 Osteopenia -1.2 0.871 g/cm2   DualFemur Total Mean 09/23/2019 67.8 Normal -0.1 1.000 g/cm2 DualFemur Total Mean 01/24/2017 65.1 Normal -0.9 0.896 g/cm2   World Health Organization [facility]) criteria for post-menopausal, Caucasian Women: Normal       T-score at or above -1 SD Osteopenia   T-score between -1 and -2.5 SD Osteoporosis T-score at or below -2.5 SD   RECOMMENDATION:   1. All patients should optimize calcium  and vitamin D  intake. 2. Consider FDA-approved medical therapies in postmenopausal women and men aged 28 years and older, based on the following: a. A hip or vertebral(clinical or morphometric) fracture. b. T-score < -2.5 at the femoral neck or spine after appropriate evaluation to exclude secondary causes c. Low bone mass (T-score between -1.0 and -2.5 at the femoral neck or spine) and a 10 year probability of a hip fracture >3% or a 10 year probability of major osteoporosis-related fracture > 20% based on the [place] -adapted WHO  algorithm d. Clinical judgement and/or patient preferences may indicate treatment for people with 10-year fracture probabilities above or below these levels   FOLLOW-UP: Patients with diagnosis of osteoporosis or at high risk for fracture should have regular bone mineral density tests. For patients eligible for Medicare, routine testing is allowed once every 2 years. The testing frequency can be increased to one year for patients who have rapidly progressing disease, those who are receiving or discontinuing medical therapy to restore bone mass, or have additional risk factors.   I have reviewed this report, anf agree with the above findings.   Mark A. Vida, M.D. [facility]     Electronically Signed   By: Oneil Vida M.D.   On: 09/23/2019 09:33     Objective:    Physical Exam  Gen: Well-appearing, in no acute distress; non-toxic CV: Well-perfused. Warm.  Resp: Breathing unlabored on room air; no wheezing. Psych: Fluid speech in conversation; appropriate affect; normal thought process  Ortho Exam - Lumbar: There is generalized midline and right sided tenderness of the mid to lower lumbar spine.  No increase of pain with flexion or extension of the lumbar spine.  Negative slump's test.  There is right sided lumbar paraspinal hypertonicity and tenderness overlying the low back and right SI joint region.  Imaging: XR Lumbar Spine 2-3 Views Result Date: 01/02/2024 2 view x-ray of the lumbar spine including AP and lateral film was ordered and reviewed by myself today.  X-rays demonstrate no significant scoliosis.  There is severe degenerative disc disease at the L4-L5 and L5-S1 level.  There is a mild compression deformity of L5 which is about 10% of the disc space, likely degenerative in nature.  There is endplate bony sclerosis from her DDD.  There is aortic calcification noted in addition.   Past Medical/Family/Surgical/Social History: Medications & Allergies  reviewed per EMR, new medications updated. Patient Active Problem List   Diagnosis Date Noted   Morbid obesity (HCC), starting BMI 55.52 07/10/2023   Right foot pain 07/10/2023   Subacromial bursitis of right shoulder joint 04/18/2023   Asthma 03/03/2023   Urinary frequency 02/03/2023   Viral upper respiratory tract infection 12/20/2022   Dysthymia 02/08/2022   Hypokalemia 01/26/2022   Mild pulmonary hypertension (HCC) 01/09/2022   Need for influenza vaccination 12/02/2021   Dysfunction of left eustachian tube 12/02/2021   Allergic rhinitis 12/02/2021   Chronic respiratory failure with hypoxia (HCC) 11/11/2021   Demand ischemia (HCC)    Respiratory acidosis    Acute respiratory failure with hypercapnia (HCC)    Acute on chronic heart failure with preserved ejection fraction (HCC)    CHF (congestive heart failure) (HCC) 10/09/2021   Reactive airway disease 09/21/2021   Viral syndrome 09/21/2021   Hypertensive retinopathy of right eye, grade 1 08/11/2021   Grade 2 hypertensive retinopathy, left 08/11/2021   Nuclear sclerotic cataract of both eyes 08/11/2021   Need for shingles vaccine 07/14/2021   Iron deficiency anemia 01/13/2021   Stage 4 chronic kidney disease (HCC) 07/09/2019   Type 2 diabetes mellitus with obesity 07/02/2019   Polyneuropathy associated with underlying disease 11/28/2018   Severe episode of recurrent major depressive disorder, without psychotic features (HCC) 11/28/2018   Type 2 diabetes mellitus with diabetic peripheral angiopathy without gangrene, without long-term current use of insulin  (HCC) 11/28/2018   Poor short term memory 11/28/2018   Moderate aortic stenosis by prior echocardiogram 11/06/2018   Diabetes mellitus (HCC) 03/21/2017   Acute renal failure superimposed on stage 2 chronic kidney disease 03/17/2016   Symptomatic anemia 03/17/2016   S/P total knee arthroplasty, right 03/17/2016   Unilateral primary osteoarthritis, right knee     Hyperlipidemia associated with type 2 diabetes mellitus (HCC) 12/15/2015   Pain in both lower extremities 11/19/2015   Numbness in both hands 08/13/2015   Panic attack 06/09/2015   Right knee pain 05/14/2015   Chronic renal insufficiency 09/16/2014   Pneumothorax on right    Atypical mycobacterium infection    ILD (interstitial lung disease) 2/2 MAI s/p med rxn    Class 3 severe obesity with serious comorbidity and body mass index (BMI) of 50.0 to 59.9 in adult (HCC) 04/03/2014   OSA (obstructive sleep apnea) 02/25/2014   Hypertensive heart disease with chronic diastolic congestive heart failure (HCC) 02/25/2014   DOE (dyspnea on exertion) 02/25/2014   Essential hypertension 02/25/2014   Past Medical History:  Diagnosis Date   Anxiety    doesn't take any meds   Arthritis of right knee 03/14/2016   Asthma    Back pain    Chronic diastolic CHF (congestive  heart failure) (HCC)    HF with Preserved EF (60-65%) - Grade II Diastolic Dysfunction (Hypertensive Heart Disease). takes Furosemide  daily   COPD (chronic obstructive pulmonary disease) (HCC)    Depression    doesn't take meds   Diabetes (HCC)    takes Januvia  daily   Eczema    uses cream as needed   GERD (gastroesophageal reflux disease)    History of bronchitis as a child    HTN (hypertension)    Insomnia    Moderate aortic stenosis by prior echocardiogram 04/2017   10/15/2021: Echo-ao calcification with moderate AS, MG 23 mmHg   Mycobacterium avium-intracellulare infection (HCC) 2016   OA (osteoarthritis)    Obese    OSA (obstructive sleep apnea) 02/25/2014   wears CPAP at night   Peripheral neuropathy    takes Gabapentin  as needed   Pneumonia    hx of-2010   Seasonal allergies    uses Flonase  daily   Sleep apnea    Family History  Problem Relation Age of Onset   COPD Father    Hypothyroidism Father    Anemia Father        iron deficiency   High blood pressure Father    Alcoholism Father    Cancer Mother 43        pancreatic   High blood pressure Mother    High Cholesterol Mother    Breast cancer Neg Hx    Past Surgical History:  Procedure Laterality Date   BREAST BIOPSY Right 03/2018   BREAST LUMPECTOMY WITH RADIOACTIVE SEED LOCALIZATION Right 12/28/2018   Procedure: RIGHT BREAST LUMPECTOMY WITH RADIOACTIVE SEED LOCALIZATION;  Surgeon: Curvin Deward MOULD, MD;  Location: [facility] OR;  Service: General;  Laterality: Right;   BREAST SURGERY     CARDIOPULMONARY EXERCISE TEST (CPX)  06/21/2021   (Performed on Coreg  25 mg twice daily): PFTs: FVC 1.76 (60%), FEV1 1.59 (77%), ratio 114% => RESTRICTIVE PHYSIOLOGY; 5 min- 1 mph - 2% grade. 7/10 dyspnea -> pulse ox range during exercise 95 to 98%, low of 90%, 97% on recovery; BP 106/84-160/64; heart rate 64-90 bpm (60% MPHR) -> Chronotropic Incompetence;;MAIN FACTOR for Exercise Limitation = BODY HABITUS w/ Chronotropic Incompetence.   CESAREAN SECTION  x2   COLONOSCOPY     CORONARY CALCIUM  SCORE & CT ANGIOGRAM  09/2017   Coronary Ca Score = 11 (low).  CTA- no obstructive CAD (minimal disease)   JOINT REPLACEMENT     KNEE ARTHROSCOPY Right    LUNG BIOPSY Right 06/10/2014   Procedure: LUNG BIOPSY;  Surgeon: Maude Fleeta Ochoa, MD;  Location: [facility] OR;  Service: Thoracic;  Laterality: Right;   POLYPECTOMY     throat   RIGHT HEART CATH N/A 10/15/2021   Procedure: RIGHT HEART CATH;  Surgeon: Claudene Victory ORN, MD;  Location: [facility] INVASIVE CV LAB;  Service: CV: Mild Pulmonary Hypertension (WHO Group 3).  Mean PAP 28 mmHg, PCWP 13 mmHg.  PVR 2.73 [place].  RAP 6 mmHg.CO-CI 5.5L/min, 2.17 L/min/m.   TOTAL KNEE ARTHROPLASTY Right 03/14/2016   Procedure: RIGHT TOTAL KNEE ARTHROPLASTY;  Surgeon: Oneil JAYSON Herald, MD;  Location: [facility] OR;  Service: Orthopedics;  Laterality: Right;   TRANSTHORACIC ECHOCARDIOGRAM  10/10/2021   EF 70 to 75%.  Hyperdynamic with no RWMA.  Moderate LVH.  Moderate LA dilation indicating likely diastolic dysfunction although not interpretable.  Moderately elevated  PAP with normal RV function, dilated IVC with elevated RAP estimated at 15 mmHg..  Moderate MAC with no MR.  AOV  calcification with Moderate AS-mean AvG 23 mmHg.   TRANSTHORACIC ECHOCARDIOGRAM  06/14/2021   EF 60 to 65%.  No RWMA.  Moderate cLVH with GR 1 DD.  Mild LA dilation.  Mild to moderate aortic calcification/stenosis.  Mean AVG 18 mmHg.   VIDEO ASSISTED THORACOSCOPY Right 06/10/2014   Procedure: VIDEO ASSISTED THORACOSCOPY;  Surgeon: Maude Fleeta Ochoa, MD;  Location: [facility] OR;  Service: Thoracic;  Laterality: Right;   Social History   Occupational History   Occupation: retired  Tobacco Use   Smoking status: Former    Current packs/day: 0.00    Average packs/day: 0.3 packs/day for 15.0 years (3.8 ttl pk-yrs)    Types: Cigarettes    Start date: 02/07/1965    Quit date: 02/08/1980    Years since quitting: 43.9   Smokeless tobacco: Former  Building Services Engineer status: Never Used  Substance and Sexual Activity   Alcohol use: Not Currently    Comment: occassional/social/rare   Drug use: Not Currently   Sexual activity: Not Currently

## 2024-01-02 NOTE — Progress Notes (Signed)
 Patient says that she has had tightness and pain in her right lower back since a car ride to and from [place] . She has been seen at [facility] where they took x-rays and told her that she had a compression fracture. She is here to follow up about this tightness, and the compression fracture, today. She has had numbness and tingling in the past, but says that she has neuropathy. Her pain was worse last week, and she has not had numbness or tingling in the legs recently. She has been taking a muscle relaxer and Tylenol , and says that heat helps as well.

## 2024-01-09 ENCOUNTER — Encounter: Payer: Self-pay | Admitting: Family Medicine

## 2024-01-09 ENCOUNTER — Other Ambulatory Visit (HOSPITAL_COMMUNITY): Payer: Self-pay

## 2024-01-09 ENCOUNTER — Telehealth: Payer: Self-pay

## 2024-01-09 ENCOUNTER — Other Ambulatory Visit: Payer: Self-pay

## 2024-01-09 ENCOUNTER — Ambulatory Visit: Admitting: Family Medicine

## 2024-01-09 VITALS — BP 142/82 | HR 64 | Temp 97.8°F | Ht 68.0 in | Wt 313.8 lb

## 2024-01-09 DIAGNOSIS — N184 Chronic kidney disease, stage 4 (severe): Secondary | ICD-10-CM | POA: Diagnosis not present

## 2024-01-09 DIAGNOSIS — Z1211 Encounter for screening for malignant neoplasm of colon: Secondary | ICD-10-CM | POA: Diagnosis not present

## 2024-01-09 DIAGNOSIS — J309 Allergic rhinitis, unspecified: Secondary | ICD-10-CM

## 2024-01-09 DIAGNOSIS — Z7984 Long term (current) use of oral hypoglycemic drugs: Secondary | ICD-10-CM

## 2024-01-09 DIAGNOSIS — E1169 Type 2 diabetes mellitus with other specified complication: Secondary | ICD-10-CM

## 2024-01-09 DIAGNOSIS — L309 Dermatitis, unspecified: Secondary | ICD-10-CM | POA: Diagnosis not present

## 2024-01-09 DIAGNOSIS — Z6841 Body Mass Index (BMI) 40.0 and over, adult: Secondary | ICD-10-CM | POA: Diagnosis not present

## 2024-01-09 DIAGNOSIS — E1122 Type 2 diabetes mellitus with diabetic chronic kidney disease: Secondary | ICD-10-CM

## 2024-01-09 LAB — BASIC METABOLIC PANEL WITH GFR
BUN: 23 mg/dL (ref 6–23)
CO2: 34 meq/L — ABNORMAL HIGH (ref 19–32)
Calcium: 9.9 mg/dL (ref 8.4–10.5)
Chloride: 102 meq/L (ref 96–112)
Creatinine, Ser: 1.8 mg/dL — ABNORMAL HIGH (ref 0.40–1.20)
GFR: 27.87 mL/min — ABNORMAL LOW (ref >60.00)
Glucose, Bld: 88 mg/dL (ref 70–99)
Potassium: 4.4 meq/L (ref 3.5–5.1)
Sodium: 141 meq/L (ref 135–145)

## 2024-01-09 LAB — HEMOGLOBIN A1C: Hgb A1c MFr Bld: 5.6 % (ref 4.6–6.5)

## 2024-01-09 MED ORDER — FLUTICASONE PROPIONATE 50 MCG/ACT NA SUSP
1.0000 | Freq: Every day | NASAL | 3 refills | Status: DC
  Filled 2024-01-09 – 2024-01-14 (×2): qty 16, 60d supply, fill #0

## 2024-01-09 MED ORDER — ACCU-CHEK SOFTCLIX LANCETS MISC
12 refills | Status: AC
  Filled 2024-01-09: qty 100, 33d supply, fill #0

## 2024-01-09 NOTE — Telephone Encounter (Signed)
 Pt is requesting a referral for dermatology. States the skin on her face is rough. LOV 01/09/24. Pt forgot to ask for the referral at that time.

## 2024-01-09 NOTE — Telephone Encounter (Signed)
 [name]

## 2024-01-09 NOTE — Progress Notes (Signed)
 Established Patient Office Visit   Subjective:  Patient ID: Stacey Page, female    DOB: Jan 19, 1951  Age: 73 y.o. MRN: [id number]  Chief Complaint  Patient presents with   Follow-up    6 months colo guard, knot on right under ear noticed it sunday    HPI Encounter Diagnoses  Name Primary?   Type 2 diabetes mellitus with other specified complication, without long-term current use of insulin (HCC) Yes   Screening for colon cancer    Stage 4 chronic kidney disease (HCC)    Current BMI 49.3    Allergic rhinitis, unspecified seasonality, unspecified trigger    For follow-up of above.  She is accompanied by her daughter.  Is feeling well today.  Continues Farxiga and semaglutide for her history of type 2 diabetes with CKD 4 and morbid obesity.  Continues to follow-up with weight loss management.  She is planning on starting aquatic aerobics for exercise.  Continues with Flonase for allergic rhinitis.  Has been quite helped for her.  Ongoing follow-up with cardiology for diastolic heart failure with preserved EF and hypertensive heart disease.  Continues follow-up with sports medicine for back pain.  Mild compression fracture of L5.  She has been referred to the osteoporosis clinic.  Ongoing follow-up with nephrology.   Review of Systems  Constitutional: Negative.   HENT: Negative.    Eyes:  Negative for blurred vision, discharge and redness.  Respiratory: Negative.    Cardiovascular: Negative.   Gastrointestinal:  Negative for abdominal pain.  Genitourinary: Negative.   Musculoskeletal: Negative.  Negative for myalgias.  Skin:  Negative for rash.  Neurological:  Negative for tingling, loss of consciousness and weakness.  Endo/Heme/Allergies:  Negative for polydipsia.      12 /03/2023   10:08 AM 05/29/2023   10:56 AM 05/04/2023    9:02 AM  Depression screen PHQ 2/9  Decreased Interest 0 0 0  Down, Depressed, Hopeless 0 0 0  PHQ - 2 Score 0 0 0  Altered sleeping 1    Tired,  decreased energy 1    Change in appetite 0    Feeling bad or failure about yourself  0    Trouble concentrating 0    Moving slowly or fidgety/restless 0    Suicidal thoughts 0    PHQ-9 Score 2    Difficult doing work/chores Not difficult at all        Current Outpatient Medications:    albuterol  (VENTOLIN  HFA) 108 (90 Base) MCG/ACT inhaler, INHALE 1-2 PUFFS into THE lungs EVERY 6 HOURS AS NEEDED FOR WHEEZING AND/OR SHORTNESS OF BREATH, Disp: 6.7 g, Rfl: 3   aspirin  81 MG tablet, Take 1 tablet (81 mg total) by mouth daily with breakfast., Disp: 90 tablet, Rfl: 0   atorvastatin  (LIPITOR) 20 MG tablet, Take 1 tablet (20 mg total) by mouth daily., Disp: 90 tablet, Rfl: 3   Blood Glucose Monitoring Suppl (ONETOUCH VERIO SYNC SYSTEM) w/Device KIT, 1 kit by Does not apply route daily as needed., Disp: 1 kit, Rfl: 0   carvedilol  (COREG ) 25 MG tablet, Take 1 tablet (25 mg total) by mouth 2 (two) times daily., Disp: 180 tablet, Rfl: 3   cholecalciferol (VITAMIN D3) 25 MCG (1000 UNIT) tablet, Take 2,000 Units by mouth daily., Disp: , Rfl:    cyclobenzaprine  (FLEXERIL ) 5 MG tablet, Take 0.5-1 tablets (2.5-5 mg total) by mouth 3 (three) times daily as needed for muscle spasms., Disp: 20 tablet, Rfl: 0   dapagliflozin  propanediol (FARXIGA ) 5  MG TABS tablet, Take 1 tablet (5 mg total) by mouth daily before breakfast., Disp: 90 tablet, Rfl: 2   Docusate Sodium  100 MG capsule, Take 100 mg by mouth 2 (two) times daily., Disp: , Rfl:    ferrous sulfate  325 (65 FE) MG tablet, Take 325 mg by mouth 2 (two) times daily with a meal., Disp: , Rfl:    furosemide  (LASIX ) 80 MG tablet, Take 1 (one) Tablet by mouth three times a week and as needed on other days, Disp: 90 tablet, Rfl: 3   gabapentin  (NEURONTIN ) 100 MG capsule, Take 1 capsule (100 mg total) by mouth 2 (two) times daily., Disp: 180 capsule, Rfl: 1   glucose blood (ONETOUCH VERIO) test strip, Use to monitor blood sugar twice daily as instructed, Disp: 200  each, Rfl: 3   hydrALAZINE  (APRESOLINE ) 25 MG tablet, Take 1 tablet (25 mg total) by mouth 3 (three) times daily., Disp: 90 tablet, Rfl: 3   isosorbide  dinitrate (ISORDIL ) 10 MG tablet, Take 1 tablet (10 mg total) by mouth 3 (three) times daily. Please keep scheduled appointment, Disp: 270 tablet, Rfl: 3   losartan  (COZAAR ) 25 MG tablet, Take 0.5 tablets (12.5 mg total) by mouth daily., Disp: 45 tablet, Rfl: 3   montelukast  (SINGULAIR ) 10 MG tablet, Take 1 tablet (10 mg total) by mouth at bedtime., Disp: 90 tablet, Rfl: 3   pantoprazole  (PROTONIX ) 40 MG tablet, Take 1 tablet (40 mg total) by mouth daily., Disp: 90 tablet, Rfl: 1   potassium chloride  (KLOR-CON  M) 10 MEQ tablet, Take 1 tablet (10 mEq total) by mouth daily., Disp: 60 tablet, Rfl: 2   Semaglutide , 1 MG/DOSE, 4 MG/3ML SOPN, Inject 1 mg as directed once a week., Disp: 9 mL, Rfl: 0   Accu-Chek Softclix Lancets lancets, May check glucose up to 3 times daily., Disp: 100 each, Rfl: 12   fluticasone  (FLONASE ) 50 MCG/ACT nasal spray, Place 1 spray into both nostrils daily., Disp: 48 g, Rfl: 3   tiZANidine  (ZANAFLEX ) 2 MG tablet, Take 1 tablet (2 mg total) by mouth every 12 (twelve) hours as needed for 7 days (Patient not taking: Reported on 01/09/2024), Disp: 14 tablet, Rfl: 0   Objective:     BP (!) 142/82   Pulse 64   Temp 97.8 F (36.6 C) (Temporal)   Ht 5' 8 (1.727 m)   Wt (!) 313 lb 12.8 oz (142.3 kg)   SpO2 98%   BMI 47.71 kg/m    Physical Exam Constitutional:      General: She is not in acute distress.    Appearance: Normal appearance. She is not ill-appearing, toxic-appearing or diaphoretic.  HENT:     Head: Normocephalic and atraumatic.     Right Ear: Tympanic membrane, ear canal and external ear normal.     Left Ear: Tympanic membrane, ear canal and external ear normal.     Mouth/Throat:     Mouth: Mucous membranes are moist.     Pharynx: Oropharynx is clear. No oropharyngeal exudate or posterior oropharyngeal  erythema.  Eyes:     General: No scleral icterus.       Right eye: No discharge.        Left eye: No discharge.     Extraocular Movements: Extraocular movements intact.     Conjunctiva/sclera: Conjunctivae normal.     Pupils: Pupils are equal, round, and reactive to light.  Cardiovascular:     Rate and Rhythm: Normal rate and regular rhythm.  Pulmonary:  Effort: Pulmonary effort is normal. No respiratory distress.     Breath sounds: Normal breath sounds. No wheezing, rhonchi or rales.  Abdominal:     General: Bowel sounds are normal.  Musculoskeletal:     Cervical back: No rigidity or tenderness.     Right lower leg: No edema.     Left lower leg: No edema.  Skin:    General: Skin is warm and dry.  Neurological:     Mental Status: She is alert and oriented to person, place, and time.  Psychiatric:        Mood and Affect: Mood normal.        Behavior: Behavior normal.      No results found for any visits on 01/09/24.    The 10-year ASCVD risk score (Arnett DK, et al., 2019) is: 26.6%    Assessment & Plan:   Type 2 diabetes mellitus with other specified complication, without long-term current use of insulin  (HCC) -     Accu-Chek Softclix Lancets; May check glucose up to 3 times daily.  Dispense: 100 each; Refill: 12 -     Basic metabolic panel with GFR -     Hemoglobin A1c  Screening for colon cancer -     Cologuard  Stage 4 chronic kidney disease (HCC) -     Basic metabolic panel with GFR  Current BMI 49.3  Allergic rhinitis, unspecified seasonality, unspecified trigger -     Fluticasone  Propionate; Place 1 spray into both nostrils daily.  Dispense: 48 g; Refill: 3    Return in about 6 months (around 07/09/2024).  Continue all medications as above.  Adjustments made pending results of labs.  Elsie Sim Lent, MD

## 2024-01-11 ENCOUNTER — Ambulatory Visit (INDEPENDENT_AMBULATORY_CARE_PROVIDER_SITE_OTHER): Admitting: Adult Health

## 2024-01-11 ENCOUNTER — Ambulatory Visit: Payer: Self-pay | Admitting: Family Medicine

## 2024-01-11 ENCOUNTER — Other Ambulatory Visit (HOSPITAL_COMMUNITY): Payer: Self-pay

## 2024-01-11 ENCOUNTER — Other Ambulatory Visit: Payer: Self-pay

## 2024-01-11 ENCOUNTER — Encounter (INDEPENDENT_AMBULATORY_CARE_PROVIDER_SITE_OTHER): Payer: Self-pay | Admitting: Adult Health

## 2024-01-11 VITALS — BP 143/82 | HR 74 | Temp 98.2°F | Ht 66.0 in | Wt 309.0 lb

## 2024-01-11 DIAGNOSIS — J849 Interstitial pulmonary disease, unspecified: Secondary | ICD-10-CM | POA: Diagnosis not present

## 2024-01-11 DIAGNOSIS — Z6841 Body Mass Index (BMI) 40.0 and over, adult: Secondary | ICD-10-CM | POA: Diagnosis not present

## 2024-01-11 DIAGNOSIS — E1169 Type 2 diabetes mellitus with other specified complication: Secondary | ICD-10-CM | POA: Diagnosis not present

## 2024-01-11 DIAGNOSIS — E1122 Type 2 diabetes mellitus with diabetic chronic kidney disease: Secondary | ICD-10-CM | POA: Diagnosis not present

## 2024-01-11 DIAGNOSIS — Z7984 Long term (current) use of oral hypoglycemic drugs: Secondary | ICD-10-CM | POA: Diagnosis not present

## 2024-01-11 DIAGNOSIS — N184 Chronic kidney disease, stage 4 (severe): Secondary | ICD-10-CM

## 2024-01-11 DIAGNOSIS — Z7985 Long-term (current) use of injectable non-insulin antidiabetic drugs: Secondary | ICD-10-CM

## 2024-01-11 NOTE — Progress Notes (Signed)
 WEIGHT SUMMARY AND BIOMETRICS  Vitals Temp: 98.2 F (36.8 C) BP: (!) 143/82 Pulse Rate: 74 SpO2: 97 %   Anthropometric Measurements Height: 5' 6 (1.676 m) Weight: (!) 309 lb (140.2 kg) BMI (Calculated): 49.9 Weight at Last Visit: 305lb Weight Lost Since Last Visit: 0lb Weight Gained Since Last Visit: 4lb Starting Weight: 344lb Total Weight Loss (lbs): 35 lb (15.9 kg)   Body Composition  Body Fat %: 54.9 % Fat Mass (lbs): 170 lbs Muscle Mass (lbs): 132.6 lbs Total Body Water  (lbs): 98.8 lbs Visceral Fat Rating : 23   Other Clinical Data Fasting: Yes Labs: No Today's Visit #: 29 Starting Date: 03/25/19    Chief Complaint:   OBESITY Stacey Page is here to discuss her progress with her obesity treatment plan.  She is on the the Category 2 Plan and states she is following her eating plan approximately 50 % of the time.  She states she is exercising Walking 15 minutes 2 times per week.  Interim History:  Her daughter has accompanied her to her OV today. Ms. Russaw presents appearing well and NAD. She is not wearing supplemental O2 She has chronic f/u with established Pulmnologist next week. She is hopeful that supplemental O2 will be d/c'd. She continues to wear nasal cannula when sleeping, 2L  She enjoyed the cruise and the family wedding last month.  She experienced lower back pain/muscle spasms for the long car ride to and from port of sail in [place] .  She used PRN flexeril  and will begin water  aerobics next month.  PCP checked labs 01/09/2024- reviewed in EPIC  Subjective:   1. ILD (interstitial lung disease) 2/2 MAI s/p med rxn 03/03/2023 Pulmonology OV Notes HPI: 73 year old female former smoker followed for asthma, sleep apnea, mild pulmonary hypertension, history of mild interstitial lung disease changes secondary to MAI (VATS biopsy May 2016 bronchiolitis with scattered nonnecrotizing granulomatous inflammation with atypical  Mycobacterium) Medical history significant for aortic valve stenosis, diastolic dysfunction, chronic kidney disease and diabetes     TEST/EVENTS :  PFT 03/12/14 >> FEV1 2.05 (88%), FEV1% 87, TLC 4.14 (75%), DLCO 52%, no BD VATS lung bx 06/10/14 >> bronchiolitis with non necrotizing granuloma and AFB PFT 01/30/22 >> FEV1 1.60 (62%), FEV1% 85, TLC 3.83 (69%), DLCO 62%   Chest Imaging:  HRCT chest 03/20/14 >> multiple small nodules, patchy GGO, mild BTX, air trapping Cardiac CT 10/06/17 >> scattered areas of GGO w/o change from 2016 HRCT chest 01/31/22 >> mild fibrosis pattern with relatively heterogeneous, asymmetric distribution of peribronchovascular and subpleural ground-glass, architectural distortion, and scattered areas of subpleural bronchiolectasis conspicuously in the anterior left upper lobe, stable subpleural nodules, air trapping (no change from 2016)   Sleep Tests:  HST 02/12/14 >> AHI 59.3, SaO2 low 67% Auto CPAP 01/25/22 to 02/23/22 >> used on 30 of 30 nights with average 6 hrs 23 min.  Average AHI 7.9 with median CPAP 10 and 95 th percentile CPAP 11 cm H2O   Cardiac Tests:  Echo 10/10/21 >> EF 70 to 75%, mod LVH, mod LA dilation, mod AS   03/03/2023 Follow up: Asthma, OSA, ILD  Patient returns for 31-month follow-up.  Patient has underlying sleep apnea is on nocturnal CPAP.  Patient says she wears her CPAP every single night.  Feels that she benefits from CPAP with decreased daytime sleepiness.  Usually gets that about 6 to 7 hours.  Would like a order for a chinstrap to go to the DME company.  She is  currently using nasal pillows but feels like her mouth comes open intermittently.  CPAP download has been requested.   Patient remains on oxygen  2 L during the daytime and 2 L with her CPAP.  Patient says she has been doing well.  Does go occasionally without her oxygen .  Today in office O2 saturations at rest were 96%.   Patient does have mild asthma.  Remains on Singulair  and Flonase   daily.  Says overall her breathing's been doing good.  She denies any flare of cough or wheezing.   Patient has a history of interstitial lung changes on CT chest.  She had a VATS biopsy in 2016 with bronchiolitis with scattered nonnecrotizing granulomatous inflammation with atypical Mycobacterium.  PFTs in 2023 showed stable lung function with moderate restriction and a decreased diffusing capacity. High-resolution CT chest December 2023 showed stable mild pulmonary fibrosis. Patient is overall breathing is doing okay.  She does get short of breath if she has to walk a prolonged period of time but no flare of cough or wheezing.   2. Type 2 diabetes mellitus with other specified complication, without long-term current use of insulin  [facility]) Discussed Labs  Latest Reference Range & Units 12/12/22 15:47 05/02/23 09:20 10/05/23 12:32  INSULIN  2.6 - 24.9 uIU/mL 10.8 6.9 8.4   Lab Results  Component Value Date   HGBA1C 5.6 01/09/2024   HGBA1C 5.7 (H) 10/05/2023   HGBA1C 5.7 (H) 05/02/2023    A1c at goal!  She has not been checking home CBG She denies sx's of hypoglycemia She is on daily Farxiga  5mg  and weekly Ozempic  1mg  Denies mass in neck, dysphagia, dyspepsia, persistent hoarseness, abdominal pain, or N/V/C   3. Stage 4 chronic kidney disease (HCC) BP stable at OV She is followed by Nephrology  Assessment/Plan:   1. ILD (interstitial lung disease) 2/2 MAI s/p med rxn F/u with established Pulmologoy Monitor respiratory status  2. Type 2 diabetes mellitus with other specified complication, without long-term current use of insulin  (HCC) (Primary) Continue healthy eating and remain active daily  3. Stage 4 chronic kidney disease (HCC) Avoid Nephrotoxic substances Monitor Labs F/u with established Nephrologist  4. Current BMI 50.0  Brunette is currently in the action stage of change. As such, her goal is to continue with weight loss efforts. She has agreed to the Category 2 Plan.    Exercise goals: Older adults should follow the adult guidelines. When older adults cannot meet the adult guidelines, they should be as physically active as their abilities and conditions will allow.  Older adults should do exercises that maintain or improve balance if they are at risk of falling.  Older adults should determine their level of effort for physical activity relative to their level of fitness.  Older adults with chronic conditions should understand whether and how their conditions affect their ability to do regular physical activity safely. Water  Aerobics - will begin Jan 2026  Behavioral modification strategies: increasing lean protein intake, decreasing simple carbohydrates, increasing vegetables, increasing water  intake, meal planning and cooking strategies, keeping healthy foods in the home, ways to avoid boredom eating, and planning for success.  Breea has agreed to follow-up with our clinic in 4 weeks. She was informed of the importance of frequent follow-up visits to maximize her success with intensive lifestyle modifications for her multiple health conditions.   Objective:   Blood pressure (!) 143/82, pulse 74, temperature 98.2 F (36.8 C), height 5' 6 (1.676 m), weight (!) 309 lb (140.2 kg), SpO2 97%.  Body mass index is 49.87 kg/m.  General: Cooperative, alert, well developed, in no acute distress. HEENT: Conjunctivae and lids unremarkable. Cardiovascular: Regular rhythm.  Lungs: Normal work of breathing. Neurologic: No focal deficits.   Lab Results  Component Value Date   CREATININE 1.80 (H) 01/09/2024   BUN 23 01/09/2024   NA 141 01/09/2024   K 4.4 01/09/2024   CL 102 01/09/2024   CO2 34 (H) 01/09/2024   Lab Results  Component Value Date   ALT 9 10/05/2023   AST 11 10/05/2023   ALKPHOS 78 10/05/2023   BILITOT 0.4 10/05/2023   Lab Results  Component Value Date   HGBA1C 5.6 01/09/2024   HGBA1C 5.7 (H) 10/05/2023   HGBA1C 5.7 (H) 05/02/2023    HGBA1C 6.0 04/18/2023   HGBA1C 6.2 (H) 12/12/2022   Lab Results  Component Value Date   INSULIN  8.4 10/05/2023   INSULIN  6.9 05/02/2023   INSULIN  10.8 12/12/2022   INSULIN  8.6 10/28/2019   INSULIN  9.2 07/04/2019   Lab Results  Component Value Date   TSH 0.644 10/05/2023   Lab Results  Component Value Date   CHOL 144 12/12/2022   HDL 63 12/12/2022   LDLCALC 68 12/12/2022   TRIG 62 12/12/2022   CHOLHDL 2.3 12/12/2022   Lab Results  Component Value Date   VD25OH 36.0 10/05/2023   VD25OH 41.7 05/02/2023   VD25OH 36.8 12/12/2022   Lab Results  Component Value Date   WBC 4.3 02/03/2023   HGB 13.5 02/03/2023   HCT 42.3 02/03/2023   MCV 92.6 02/03/2023   PLT 206 02/03/2023   Lab Results  Component Value Date   IRON 51 02/03/2023   TIBC 266 02/03/2023   FERRITIN 70 02/03/2023   Attestation Statements:   Reviewed by clinician on day of visit: allergies, medications, problem list, medical history, surgical history, family history, social history, and previous encounter notes.  I have reviewed the above documentation for accuracy and completeness, and I agree with the above. -  Pihu Basil d. Shonya Sumida, NP-C

## 2024-01-11 NOTE — Addendum Note (Signed)
 Addended by: BERNETA ELSIE LABOR on: 01/11/2024 08:04 AM   Modules accepted: Orders

## 2024-01-14 ENCOUNTER — Other Ambulatory Visit (HOSPITAL_COMMUNITY): Payer: Self-pay

## 2024-01-15 ENCOUNTER — Other Ambulatory Visit: Payer: Self-pay

## 2024-01-16 ENCOUNTER — Ambulatory Visit: Admitting: Adult Health

## 2024-01-16 NOTE — Telephone Encounter (Signed)
 Copied from CRM [id number]. Topic: Referral - Status >> Jan 16, 2024  4:06 PM Jayma L wrote: Reason for CRM: patient called asking for a update on referral , asking for a callback with info please . Best number is [phone]

## 2024-01-17 ENCOUNTER — Other Ambulatory Visit: Payer: Self-pay

## 2024-01-17 ENCOUNTER — Other Ambulatory Visit: Payer: Self-pay | Admitting: Cardiology

## 2024-01-17 ENCOUNTER — Other Ambulatory Visit: Payer: Self-pay | Admitting: Family Medicine

## 2024-01-17 ENCOUNTER — Other Ambulatory Visit (HOSPITAL_COMMUNITY): Payer: Self-pay

## 2024-01-17 DIAGNOSIS — E876 Hypokalemia: Secondary | ICD-10-CM

## 2024-01-17 MED ORDER — ISOSORBIDE DINITRATE 10 MG PO TABS
10.0000 mg | ORAL_TABLET | Freq: Three times a day (TID) | ORAL | 1 refills | Status: AC
  Filled 2024-01-17: qty 40, 14d supply, fill #0
  Filled 2024-01-17: qty 230, 76d supply, fill #0

## 2024-01-18 ENCOUNTER — Encounter: Payer: Self-pay | Admitting: Adult Health

## 2024-01-18 ENCOUNTER — Other Ambulatory Visit: Payer: Self-pay

## 2024-01-18 ENCOUNTER — Ambulatory Visit: Admitting: Adult Health

## 2024-01-18 ENCOUNTER — Other Ambulatory Visit (HOSPITAL_COMMUNITY): Payer: Self-pay

## 2024-01-18 ENCOUNTER — Ambulatory Visit

## 2024-01-18 VITALS — BP 170/79 | HR 64 | Temp 99.1°F | Ht 66.0 in | Wt 316.0 lb

## 2024-01-18 DIAGNOSIS — J9611 Chronic respiratory failure with hypoxia: Secondary | ICD-10-CM

## 2024-01-18 DIAGNOSIS — J4530 Mild persistent asthma, uncomplicated: Secondary | ICD-10-CM | POA: Diagnosis not present

## 2024-01-18 DIAGNOSIS — Z87891 Personal history of nicotine dependence: Secondary | ICD-10-CM

## 2024-01-18 DIAGNOSIS — G4733 Obstructive sleep apnea (adult) (pediatric): Secondary | ICD-10-CM | POA: Diagnosis not present

## 2024-01-18 DIAGNOSIS — J309 Allergic rhinitis, unspecified: Secondary | ICD-10-CM | POA: Diagnosis not present

## 2024-01-18 DIAGNOSIS — J4520 Mild intermittent asthma, uncomplicated: Secondary | ICD-10-CM

## 2024-01-18 DIAGNOSIS — J8410 Pulmonary fibrosis, unspecified: Secondary | ICD-10-CM

## 2024-01-18 DIAGNOSIS — J849 Interstitial pulmonary disease, unspecified: Secondary | ICD-10-CM

## 2024-01-18 MED ORDER — FLUTICASONE PROPIONATE 50 MCG/ACT NA SUSP
2.0000 | Freq: Two times a day (BID) | NASAL | 5 refills | Status: AC | PRN
  Filled 2024-01-18: qty 48, 90d supply, fill #0

## 2024-01-18 NOTE — Progress Notes (Unsigned)
 @Patient  ID: Stacey Page, female    DOB: May 03, 1950, 73 y.o.   MRN: [id number]  Chief Complaint  Patient presents with   Medical Management of Chronic Issues    Osa, ILD, asthma    Referring provider: Berneta Elsie Sayre, MD  HPI: 73 yo female former smoker followed for asthma, obstructive sleep apnea, mild pulmonary hypertension, history of mild interstitial lung disease felt secondary to MAI (VATS biopsy May 2016 positive for bronchiolitis with scattered nonnecrotizing granulomatous inflammation with atypical Mycobacterium) Medical history significant for aortic valve stenosis, diastolic dysfunction, chronic kidney disease and diabetes    TEST/EVENTS : Reviewed 01/18/2024  PFT 03/12/14 >> FEV1 2.05 (88%), FEV1% 87, TLC 4.14 (75%), DLCO 52%, no BD VATS lung bx 06/10/14 >> bronchiolitis with non necrotizing granuloma and AFB PFT 01/30/22 >> FEV1 1.60 (62%), FEV1% 85, TLC 3.83 (69%), DLCO 62%   Chest Imaging:  HRCT chest 03/20/14 >> multiple small nodules, patchy GGO, mild BTX, air trapping Cardiac CT 10/06/17 >> scattered areas of GGO w/o change from 2016 HRCT chest 01/31/22 >> mild fibrosis pattern with relatively heterogeneous, asymmetric distribution of peribronchovascular and subpleural ground-glass, architectural distortion, and scattered areas of subpleural bronchiolectasis conspicuously in the anterior left upper lobe, stable subpleural nodules, air trapping (no change from 2016)   Sleep Tests:  HST 02/12/14 >> AHI 59.3, SaO2 low 67% Auto CPAP 01/25/22 to 02/23/22 >> used on 30 of 30 nights with average 6 hrs 23 min.  Average AHI 7.9 with median CPAP 10 and 95 th percentile CPAP 11 cm H2O   Cardiac Tests:  Echo 10/10/21 >> EF 70 to 75%, mod LVH, mod LA dilation, mod AS Discussed the use of AI scribe software for clinical note transcription with the patient, who gave verbal consent to proceed.  History of Present Illness Stacey Page is a 73 year old female  with asthma and sleep apnea who presents for an annual checkup and follow-up on her pulmonary conditions.  She reports infrequent use of albuterol  for her asthma. She continues to take Singulair  and Flonase  as prescribed.  She has a history of lung scarring, which has been stable since 2016. A chest x-ray was previously planned but not completed; she agrees to have it done today. Her last CT scan, nearly two years ago, showed similar scarring to previous scans.  She uses a CPAP machine with a nasal mask for her sleep apnea. She uses oxygen  at night with her CPAP.  She experiences issues with medication refills, specifically with hydrochlorothiazide and hydralazine , noting a discrepancy in the number of pills received. She also uses Fluticasone  nasal spray for congestion.  She is currently taking Ozempic  for diabetes management and is considering lifestyle changes to improve her condition further.  She lives with her daughter, who assists her with medical appointments and medication management. She has recently moved back into her house with her daughter.      Allergies[1]  Immunization History  Administered Date(s) Administered   Fluad Quad(high Dose 65+) 11/28/2018, 01/29/2020   INFLUENZA, HIGH DOSE SEASONAL PF 12/02/2021   Influenza Split 11/21/2016   Influenza,inj,Quad PF,6+ Mos 01/12/2015, 11/19/2015   PFIZER(Purple Top)SARS-COV-2 Vaccination 03/31/2019, 04/23/2019, 03/21/2020   Pneumococcal Conjugate-13 01/19/2017   Pneumococcal Polysaccharide-23 01/12/2015, 05/14/2020   Zoster Recombinant(Shingrix ) 07/14/2021, 12/02/2021    Past Medical History:  Diagnosis Date   Anxiety    doesn't take any meds   Arthritis of right knee 03/14/2016   Asthma    Back pain  Chronic diastolic CHF (congestive heart failure) (HCC)    HF with Preserved EF (60-65%) - Grade II Diastolic Dysfunction (Hypertensive Heart Disease). takes Furosemide  daily   COPD (chronic obstructive pulmonary  disease) (HCC)    Depression    doesn't take meds   Diabetes (HCC)    takes Januvia  daily   Eczema    uses cream as needed   GERD (gastroesophageal reflux disease)    History of bronchitis as a child    HTN (hypertension)    Insomnia    Moderate aortic stenosis by prior echocardiogram 04/2017   10/15/2021: Echo-ao calcification with moderate AS, MG 23 mmHg   Mycobacterium avium-intracellulare infection (HCC) 2016   OA (osteoarthritis)    Obese    OSA (obstructive sleep apnea) 02/25/2014   wears CPAP at night   Peripheral neuropathy    takes Gabapentin  as needed   Pneumonia    hx of-2010   Seasonal allergies    uses Flonase  daily   Sleep apnea     Tobacco History: Tobacco Use History[2] Counseling given: Not Answered   Outpatient Medications Prior to Visit  Medication Sig Dispense Refill   Accu-Chek Softclix Lancets lancets May check glucose up to 3 times daily. 100 each 12   albuterol  (VENTOLIN  HFA) 108 (90 Base) MCG/ACT inhaler INHALE 1-2 PUFFS into THE lungs EVERY 6 HOURS AS NEEDED FOR WHEEZING AND/OR SHORTNESS OF BREATH 6.7 g 3   aspirin  81 MG tablet Take 1 tablet (81 mg total) by mouth daily with breakfast. 90 tablet 0   atorvastatin  (LIPITOR) 20 MG tablet Take 1 tablet (20 mg total) by mouth daily. 90 tablet 3   carvedilol  (COREG ) 25 MG tablet Take 1 tablet (25 mg total) by mouth 2 (two) times daily. 180 tablet 3   cholecalciferol (VITAMIN D3) 25 MCG (1000 UNIT) tablet Take 2,000 Units by mouth daily. (Patient taking differently: Take 3,000 Units by mouth daily.)     dapagliflozin  propanediol (FARXIGA ) 5 MG TABS tablet Take 1 tablet (5 mg total) by mouth daily before breakfast. 90 tablet 2   Docusate Sodium  100 MG capsule Take 100 mg by mouth 2 (two) times daily.     ferrous sulfate  325 (65 FE) MG tablet Take 325 mg by mouth 2 (two) times daily with a meal.     fluticasone  (FLONASE ) 50 MCG/ACT nasal spray Place 1 spray into both nostrils daily. (Patient taking  differently: Place 2 sprays into both nostrils daily.) 48 g 3   furosemide  (LASIX ) 80 MG tablet Take 1 (one) Tablet by mouth three times a week and as needed on other days 90 tablet 3   gabapentin  (NEURONTIN ) 100 MG capsule Take 1 capsule (100 mg total) by mouth 2 (two) times daily. 180 capsule 1   glucose blood (ONETOUCH VERIO) test strip Use to monitor blood sugar twice daily as instructed 200 each 3   hydrALAZINE  (APRESOLINE ) 25 MG tablet Take 1 tablet (25 mg total) by mouth 3 (three) times daily. 90 tablet 3   isosorbide  dinitrate (ISORDIL ) 10 MG tablet Take 1 tablet (10 mg total) by mouth 3 (three) times daily. Please keep scheduled appointment 270 tablet 1   losartan  (COZAAR ) 25 MG tablet Take 0.5 tablets (12.5 mg total) by mouth daily. 45 tablet 3   montelukast  (SINGULAIR ) 10 MG tablet Take 1 tablet (10 mg total) by mouth at bedtime. 90 tablet 3   pantoprazole  (PROTONIX ) 40 MG tablet Take 1 tablet (40 mg total) by mouth daily. 90 tablet 1  potassium chloride  (KLOR-CON  M) 10 MEQ tablet Take 1 tablet (10 mEq total) by mouth daily. 60 tablet 2   Semaglutide , 1 MG/DOSE, 4 MG/3ML SOPN Inject 1 mg as directed once a week. 9 mL 0   Blood Glucose Monitoring Suppl (ONETOUCH VERIO SYNC SYSTEM) w/Device KIT 1 kit by Does not apply route daily as needed. (Patient not taking: Reported on 01/18/2024) 1 kit 0   cyclobenzaprine  (FLEXERIL ) 5 MG tablet Take 0.5-1 tablets (2.5-5 mg total) by mouth 3 (three) times daily as needed for muscle spasms. (Patient not taking: Reported on 01/18/2024) 20 tablet 0   tiZANidine  (ZANAFLEX ) 2 MG tablet Take 1 tablet (2 mg total) by mouth every 12 (twelve) hours as needed for 7 days (Patient not taking: Reported on 01/18/2024) 14 tablet 0   No facility-administered medications prior to visit.     Review of Systems:   Constitutional:   No  weight loss, night sweats,  Fevers, chills, fatigue, or  lassitude.  HEENT:   No headaches,  Difficulty swallowing,  Tooth/dental  problems, or  Sore throat,                No sneezing, itching, ear ache, nasal congestion, post nasal drip,   CV:  No chest pain,  Orthopnea, PND, swelling in lower extremities, anasarca, dizziness, palpitations, syncope.   GI  No heartburn, indigestion, abdominal pain, nausea, vomiting, diarrhea, change in bowel habits, loss of appetite, bloody stools.   Resp: No shortness of breath with exertion or at rest.  No excess mucus, no productive cough,  No non-productive cough,  No coughing up of blood.  No change in color of mucus.  No wheezing.  No chest wall deformity  Skin: no rash or lesions.  GU: no dysuria, change in color of urine, no urgency or frequency.  No flank pain, no hematuria   MS:  No joint pain or swelling.  No decreased range of motion.  No back pain.    Physical Exam  BP (!) 170/79   Pulse 64   Temp 99.1 F (37.3 C)   Ht 5' 6 (1.676 m) Comment: Per pt  Wt (!) 316 lb (143.3 kg)   SpO2 95% Comment: RA  BMI 51.00 kg/m   GEN: A/Ox3; pleasant , NAD, well nourished    HEENT:  [place]/AT,  EACs-clear, TMs-wnl, NOSE-clear, THROAT-clear, no lesions, no postnasal drip or exudate noted.   NECK:  Supple w/ fair ROM; no JVD; normal carotid impulses w/o bruits; no thyromegaly or nodules palpated; no lymphadenopathy.    RESP  Clear  P & A; w/o, wheezes/ rales/ or rhonchi. no accessory muscle use, no dullness to percussion  CARD:  RRR, no m/r/g, no peripheral edema, pulses intact, no cyanosis or clubbing.  GI:   Soft & nt; nml bowel sounds; no organomegaly or masses detected.   Musco: Warm bil, no deformities or joint swelling noted.   Neuro: alert, no focal deficits noted.    Skin: Warm, no lesions or rashes    Lab Results:Reviewed 01/18/2024   CBC    Component Value Date/Time   WBC 4.3 02/03/2023 0828   WBC 9.5 02/01/2022 1631   RBC 4.48 02/03/2023 0829   RBC 4.57 02/03/2023 0828   HGB 13.5 02/03/2023 0828   HCT 42.3 02/03/2023 0828   HCT 32.9 (L) 10/14/2020  1057   PLT 206 02/03/2023 0828   MCV 92.6 02/03/2023 0828   MCH 29.5 02/03/2023 0828   MCHC 31.9 02/03/2023 0828  RDW 14.6 02/03/2023 0828   LYMPHSABS 1.3 02/03/2023 0828   MONOABS 0.3 02/03/2023 0828   EOSABS 0.1 02/03/2023 0828   BASOSABS 0.0 02/03/2023 0828    BMET    Component Value Date/Time   NA 141 01/09/2024 1019   NA 140 10/05/2023 1232   K 4.4 01/09/2024 1019   CL 102 01/09/2024 1019   CO2 34 (H) 01/09/2024 1019   GLUCOSE 88 01/09/2024 1019   BUN 23 01/09/2024 1019   BUN 19 10/05/2023 1232   CREATININE 1.80 (H) 01/09/2024 1019   CREATININE 1.92 (H) 02/03/2023 0828   CREATININE 1.36 (H) 03/19/2015 1030   CALCIUM  9.9 01/09/2024 1019   GFRNONAA 28 (L) 02/03/2023 0828   GFRAA 40 (L) 10/28/2019 0931    BNP    Component Value Date/Time   BNP 99.0 02/01/2022 1631    ProBNP    Component Value Date/Time   PROBNP 84.0 02/22/2022 0922    Imaging: XR Lumbar Spine 2-3 Views Result Date: 01/02/2024 2 view x-ray of the lumbar spine including AP and lateral film was ordered and reviewed by myself today.  X-rays demonstrate no significant scoliosis.  There is severe degenerative disc disease at the L4-L5 and L5-S1 level.  There is a mild compression deformity of L5 which is about 10% of the disc space, likely degenerative in nature.  There is endplate bony sclerosis from her DDD.  There is aortic calcification noted in addition.   Administration History     None          Latest Ref Rng & Units 01/20/2022    9:47 AM 03/12/2014    8:43 AM  PFT Results  FVC-Pre L 1.87  2.37   FVC-Predicted Pre % 55  80   FVC-Post L 2.04  2.35   FVC-Predicted Post % 60  79   Pre FEV1/FVC % % 85  87   Post FEV1/FCV % % 75  87   FEV1-Pre L 1.60  2.05   FEV1-Predicted Pre % 62  88   FEV1-Post L 1.52  2.05   DLCO uncorrected ml/min/mmHg 13.54  14.84   DLCO UNC% % 62  52   DLCO corrected ml/min/mmHg 13.54    DLCO COR %Predicted % 62    DLVA Predicted % 91  72   TLC L 3.83   4.14   TLC % Predicted % 69  75   RV % Predicted % 71  72     No results found for: NITRICOXIDE     09/22/2020    9:00 AM  Results of the [facility] flowsheet  Sitting and reading 3  Watching TV 3  Sitting, inactive in a public place (e.g. a theatre or a meeting) 1  As a passenger in a car for an hour without a break 0  Lying down to rest in the afternoon when circumstances permit 0  Sitting and talking to someone 0  Sitting quietly after a lunch without alcohol 0  In a car, while stopped for a few minutes in traffic 0  Total score 7        Assessment & Plan:   Assessment and Plan Assessment & Plan Mild persistent asthma   Asthma remains well-controlled without increased albuterol  use. Continue Singulair  and Flonase  as prescribed.  Pulmonary fibrosis   Condition has been well-managed since 2016 with no significant changes on recent imaging. A chest x-ray was ordered today, and a pulmonary function test will be scheduled for the next visit.  Obstructive sleep apnea   CPAP therapy effectively controls sleep apnea, with optimal settings and usage. Discussed the potential benefits of weight loss on management. Continue CPAP therapy with a nasal mask and encourage weight loss.  Allergic rhinitis   Managed with Flonase . Discussed nasal spray usage and potential nasal dryness, especially with CPAP use. Adjusted Flonase  prescription for flexible dosing (1-2 puffs twice daily as needed) and consider saline nasal gel for dryness.        Aline Wesche, NP 01/18/2024  I spent   minutes dedicated to the care of this patient on the date of this encounter to include pre-visit review of records, face-to-face time with the patient discussing conditions above, post visit ordering of testing, clinical documentation with the electronic health record, making appropriate referrals as documented, and communicating necessary findings to members of the patients care team.      [1] No Known  Allergies [2]  Social History Tobacco Use  Smoking Status Former   Current packs/day: 0.00   Average packs/day: 0.3 packs/day for 15.0 years (3.8 ttl pk-yrs)   Types: Cigarettes   Start date: 02/07/1965   Quit date: 02/08/1980   Years since quitting: 43.9  Smokeless Tobacco Former

## 2024-01-18 NOTE — Patient Instructions (Addendum)
 Chest xray today.  Continue on Oxygen  2l/m with activity, goal is to keep Oxygen  >88-90% Continue on CPAP At bedtime with Oxygen  2l/m  Activity as tolerated  Albuterol  inhaler As needed   Continue on Singulair  and Flonase   Saline nasal gel At bedtime  As needed   Follow up with Dr. Neda or Suzetta Timko NP -30 min slot . In 6 months and As needed  with PFT

## 2024-01-19 ENCOUNTER — Other Ambulatory Visit (HOSPITAL_COMMUNITY): Payer: Self-pay

## 2024-01-19 ENCOUNTER — Other Ambulatory Visit: Payer: Self-pay

## 2024-01-19 MED ORDER — GABAPENTIN 100 MG PO CAPS
100.0000 mg | ORAL_CAPSULE | Freq: Two times a day (BID) | ORAL | 1 refills | Status: AC
  Filled 2024-01-19: qty 180, 90d supply, fill #0

## 2024-01-19 MED ORDER — POTASSIUM CHLORIDE CRYS ER 10 MEQ PO TBCR
10.0000 meq | EXTENDED_RELEASE_TABLET | Freq: Every day | ORAL | 2 refills | Status: AC
  Filled 2024-01-19: qty 60, 60d supply, fill #0

## 2024-01-19 NOTE — Telephone Encounter (Signed)
 [facility] [address] [place] [address] [phone]  Patient is scheduled with them already.  Dm/cma

## 2024-01-20 DIAGNOSIS — J9610 Chronic respiratory failure, unspecified whether with hypoxia or hypercapnia: Secondary | ICD-10-CM | POA: Diagnosis not present

## 2024-01-20 DIAGNOSIS — I272 Pulmonary hypertension, unspecified: Secondary | ICD-10-CM | POA: Diagnosis not present

## 2024-01-20 DIAGNOSIS — I509 Heart failure, unspecified: Secondary | ICD-10-CM | POA: Diagnosis not present

## 2024-01-23 LAB — MICROALBUMIN / CREATININE URINE RATIO: Microalb Creat Ratio: 10

## 2024-01-23 LAB — BASIC METABOLIC PANEL WITH GFR
Creatinine: 1.7 — AB (ref 0.5–1.1)
Potassium: 4.2 meq/L (ref 3.5–5.1)

## 2024-01-23 LAB — COMPREHENSIVE METABOLIC PANEL WITH GFR: eGFR: 31

## 2024-01-23 LAB — CBC AND DIFFERENTIAL: Hemoglobin: 13.8 (ref 12.0–16.0)

## 2024-01-24 ENCOUNTER — Ambulatory Visit: Payer: Self-pay | Admitting: Adult Health

## 2024-01-30 ENCOUNTER — Telehealth: Payer: Self-pay

## 2024-01-30 NOTE — Telephone Encounter (Signed)
 Copied from CRM [id number]. Topic: Clinical - Medical Advice >> Jan 30, 2024  9:28 AM Isabell A wrote: Reason for CRM: Patient would like to confirm if her CPAP should be on 2 liters instead of 5 liters - would like to speak to someone to adjust the liters.  Callback number: [phone]   Called and spoke with the pt. Pt states pressure is higher than 2 on cpap. Pt states since her last appt 12/11 she has noticed dry mouth and states pressure feels heavy on her face and she is unable to sleep.  Per lov Continue on CPAP At bedtime with Oxygen  2l/m Tammy can you please advise as pt is requesting pressure change on cpap and states she thinks it was increased on accident.

## 2024-01-30 NOTE — Telephone Encounter (Signed)
 Oxygen  2l/m with CPAP At bedtime   She will have to look at her O2 concentrator to see what level she is at . Will need to get CPAP download to check pressure setting.

## 2024-01-31 NOTE — Telephone Encounter (Signed)
 Printed 30 day CPAP compliance download for Stacey Page to review.  Placed in her station in B POD.  Called patient.    Patient states she and her daughter worked out the problem with the oxygen  and CPAP.  She was getting the two settings mixed up.  Patient is now putting oxygen  on 2L.  Patient slept very well last night.  Patient states she does not need anything further at this time and we can disregard the message.  She will follow up with Stacey Page as advised in June 2026.

## 2024-02-12 LAB — OPHTHALMOLOGY REPORT-SCANNED

## 2024-02-14 ENCOUNTER — Other Ambulatory Visit: Payer: Self-pay

## 2024-02-14 NOTE — Therapy (Signed)
 " OUTPATIENT PHYSICAL THERAPY THORACOLUMBAR EVALUATION   Patient Name: Stacey Page MRN: [id number] DOB:[birth year], 74 y.o., female Today's Date: 02/15/2024  END OF SESSION:  PT End of Session - 02/15/24 1311     Visit Number 1    Date for Recertification  04/12/24    Authorization Type HTA mcr    PT Start Time 1146    PT Stop Time 1230    PT Time Calculation (min) 44 min    Activity Tolerance Patient tolerated treatment well    Behavior During Therapy [facility] for tasks assessed/performed          Past Medical History:  Diagnosis Date   Anxiety    doesn't take any meds   Arthritis of right knee 03/14/2016   Asthma    Back pain    Chronic diastolic CHF (congestive heart failure) (HCC)    HF with Preserved EF (60-65%) - Grade II Diastolic Dysfunction (Hypertensive Heart Disease). takes Furosemide  daily   COPD (chronic obstructive pulmonary disease) (HCC)    Depression    doesn't take meds   Diabetes (HCC)    takes Januvia  daily   Eczema    uses cream as needed   GERD (gastroesophageal reflux disease)    History of bronchitis as a child    HTN (hypertension)    Insomnia    Moderate aortic stenosis by prior echocardiogram 04/2017   10/15/2021: Leanor calcification with moderate AS, MG 23 mmHg   Mycobacterium avium-intracellulare infection (HCC) 2016   OA (osteoarthritis)    Obese    OSA (obstructive sleep apnea) 02/25/2014   wears CPAP at night   Peripheral neuropathy    takes Gabapentin  as needed   Pneumonia    hx of-2010   Seasonal allergies    uses Flonase  daily   Sleep apnea    Past Surgical History:  Procedure Laterality Date   BREAST BIOPSY Right 03/2018   BREAST LUMPECTOMY WITH RADIOACTIVE SEED LOCALIZATION Right 12/28/2018   Procedure: RIGHT BREAST LUMPECTOMY WITH RADIOACTIVE SEED LOCALIZATION;  Surgeon: Curvin Deward MOULD, MD;  Location: [facility] OR;  Service: General;  Laterality: Right;   BREAST SURGERY     CARDIOPULMONARY EXERCISE TEST (CPX)  06/21/2021    (Performed on Coreg  25 mg twice daily): PFTs: FVC 1.76 (60%), FEV1 1.59 (77%), ratio 114% => RESTRICTIVE PHYSIOLOGY; 5 min- 1 mph - 2% grade. 7/10 dyspnea -> pulse ox range during exercise 95 to 98%, low of 90%, 97% on recovery; BP 106/84-160/64; heart rate 64-90 bpm (60% MPHR) -> Chronotropic Incompetence;;MAIN FACTOR for Exercise Limitation = BODY HABITUS w/ Chronotropic Incompetence.   CESAREAN SECTION  x2   COLONOSCOPY     CORONARY CALCIUM  SCORE & CT ANGIOGRAM  09/2017   Coronary Ca Score = 11 (low).  CTA- no obstructive CAD (minimal disease)   JOINT REPLACEMENT     KNEE ARTHROSCOPY Right    LUNG BIOPSY Right 06/10/2014   Procedure: LUNG BIOPSY;  Surgeon: Maude Fleeta Ochoa, MD;  Location: [facility] OR;  Service: Thoracic;  Laterality: Right;   POLYPECTOMY     throat   RIGHT HEART CATH N/A 10/15/2021   Procedure: RIGHT HEART CATH;  Surgeon: Claudene Victory ORN, MD;  Location: [facility] INVASIVE CV LAB;  Service: CV: Mild Pulmonary Hypertension (WHO Group 3).  Mean PAP 28 mmHg, PCWP 13 mmHg.  PVR 2.73 [place].  RAP 6 mmHg.CO-CI 5.5L/min, 2.17 L/min/m.   TOTAL KNEE ARTHROPLASTY Right 03/14/2016   Procedure: RIGHT TOTAL KNEE ARTHROPLASTY;  Surgeon: Oneil JAYSON Herald, MD;  Location: [facility] OR;  Service: Orthopedics;  Laterality: Right;   TRANSTHORACIC ECHOCARDIOGRAM  10/10/2021   EF 70 to 75%.  Hyperdynamic with no RWMA.  Moderate LVH.  Moderate LA dilation indicating likely diastolic dysfunction although not interpretable.  Moderately elevated PAP with normal RV function, dilated IVC with elevated RAP estimated at 15 mmHg..  Moderate MAC with no MR.  AOV calcification with Moderate AS-mean AvG 23 mmHg.   TRANSTHORACIC ECHOCARDIOGRAM  06/14/2021   EF 60 to 65%.  No RWMA.  Moderate cLVH with GR 1 DD.  Mild LA dilation.  Mild to moderate aortic calcification/stenosis.  Mean AVG 18 mmHg.   VIDEO ASSISTED THORACOSCOPY Right 06/10/2014   Procedure: VIDEO ASSISTED THORACOSCOPY;  Surgeon: Maude Fleeta Ochoa, MD;  Location: [facility] OR;   Service: Thoracic;  Laterality: Right;   Patient Active Problem List   Diagnosis Date Noted   Morbid obesity (HCC), starting BMI 55.52 07/10/2023   Right foot pain 07/10/2023   Subacromial bursitis of right shoulder joint 04/18/2023   Asthma 03/03/2023   Urinary frequency 02/03/2023   Viral upper respiratory tract infection 12/20/2022   Dysthymia 02/08/2022   Hypokalemia 01/26/2022   Mild pulmonary hypertension (HCC) 01/09/2022   Need for influenza vaccination 12/02/2021   Dysfunction of left eustachian tube 12/02/2021   Allergic rhinitis 12/02/2021   Chronic respiratory failure with hypoxia (HCC) 11/11/2021   Demand ischemia (HCC)    Respiratory acidosis    Acute respiratory failure with hypercapnia (HCC)    Acute on chronic heart failure with preserved ejection fraction (HCC)    CHF (congestive heart failure) (HCC) 10/09/2021   Reactive airway disease 09/21/2021   Viral syndrome 09/21/2021   Hypertensive retinopathy of right eye, grade 1 08/11/2021   Grade 2 hypertensive retinopathy, left 08/11/2021   Nuclear sclerotic cataract of both eyes 08/11/2021   Need for shingles vaccine 07/14/2021   Iron deficiency anemia 01/13/2021   Stage 4 chronic kidney disease (HCC) 07/09/2019   Type 2 diabetes mellitus with obesity 07/02/2019   Polyneuropathy associated with underlying disease 11/28/2018   Severe episode of recurrent major depressive disorder, without psychotic features (HCC) 11/28/2018   Type 2 diabetes mellitus with diabetic peripheral angiopathy without gangrene, without long-term current use of insulin  (HCC) 11/28/2018   Poor short term memory 11/28/2018   Moderate aortic stenosis by prior echocardiogram 11/06/2018   Diabetes mellitus (HCC) 03/21/2017   Acute renal failure superimposed on stage 2 chronic kidney disease 03/17/2016   Symptomatic anemia 03/17/2016   S/P total knee arthroplasty, right 03/17/2016   Unilateral primary osteoarthritis, right knee     Hyperlipidemia associated with type 2 diabetes mellitus (HCC) 12/15/2015   Pain in both lower extremities 11/19/2015   Numbness in both hands 08/13/2015   Panic attack 06/09/2015   Right knee pain 05/14/2015   Chronic renal insufficiency 09/16/2014   Pneumothorax on right    Atypical mycobacterium infection    ILD (interstitial lung disease) 2/2 MAI s/p med rxn    Class 3 severe obesity with serious comorbidity and body mass index (BMI) of 50.0 to 59.9 in adult (HCC) 04/03/2014   OSA (obstructive sleep apnea) 02/25/2014   Hypertensive heart disease with chronic diastolic congestive heart failure (HCC) 02/25/2014   DOE (dyspnea on exertion) 02/25/2014   Essential hypertension 02/25/2014    PCP: Elsie Sim Lent MD  REFERRING PROVIDER: Burnetta Brunet, DO   REFERRING DIAG:  [phone] (ICD-10-CM) - Chronic low back pain, unspecified back pain laterality, unspecified whether sciatica present  M51.360 (ICD-10-CM) - Degeneration of intervertebral disc of lumbar region with discogenic back pain  S32.050A (ICD-10-CM) - Compression fracture of L5 vertebra, initial encounter (HCC)    Rationale for Evaluation and Treatment: Rehabilitation  THERAPY DIAG:  Other low back pain  Muscle weakness (generalized)  Other abnormalities of gait and mobility  ONSET DATE: few months exacerbation  SUBJECTIVE:                                                                                                                                                                                           SUBJECTIVE STATEMENT: had tightness and pain in her right lower back since a car ride to and from [place] . Have had LBP for the past 5 years.  Had therapy for it and it helped finished about 6 months ago. Pt arrives with dtr whom she lives with.  She reports she is sedentary.  Says her pain from recent trip has reduced since dtr has been making her walk. Occasional N&T bottom of right foot due to peripheral  neuropathies.  Pain stays in the middle and to the right  PERTINENT HISTORY:  Aquatic and land-based PT for chronic low back pain and lumbar DDD.   - 1. Chronic low back pain, unspecified back pain laterality, unspecified whether sciatica present   2. Degeneration of intervertebral disc of lumbar region with discogenic back pain   3. Compression fracture of L5 vertebra, initial encounter (HCC)   4. Osteopenia of neck of right femur   R TKR  PAIN:  Are you having pain? Yes: NPRS scale: current 0/10, am 7/10 Pain location: right hip center LB Pain description: ache Aggravating factors: first thing in am, staying in one position Relieving factors: rest  PRECAUTIONS: None  RED FLAGS: None   WEIGHT BEARING RESTRICTIONS: No  FALLS:  Has patient fallen in last 6 months? No  LIVING ENVIRONMENT: Lives with: lives with their family Lives in: House/apartment Has following equipment at home: Single point cane and Environmental Consultant - 4 wheeled  OCCUPATION: retired  PLOF: Independent  PATIENT GOALS: feel better, exercises better, getting out of bed and sitting on couch, endurance  NEXT MD VISIT: as needed  OBJECTIVE:  Note: Objective measures were completed at Evaluation unless otherwise noted.  DIAGNOSTIC FINDINGS:  XR Lumbar spijne 11/25 X-rays demonstrate no significant  scoliosis.  There is severe degenerative disc disease at the L4-L5 and  L5-S1 level.  There is a mild compression deformity of L5 which is about  10% of the disc space, likely degenerative in nature.  There is endplate  bony sclerosis from her DDD.  There is aortic calcification  noted in  addition.   PATIENT SURVEYS:  ODI:22/50=44%  COGNITION: Overall cognitive status: Within functional limits for tasks assessed     SENSATION: Ocassional N&T plantar aspect right foot  MUSCLE LENGTH: Hamstrings: Right tight tested in sitting   POSTURE: Body habitus creates difficulty to assess well increased lumbar  lordosis , left knee valgus  PALPATION: TTP lumbar paraspinals  LUMBAR ROM:   AROM eval  Flexion Full *P  Extension Full P*  Right lateral flexion 50% limited P!   Left lateral flexion 50% limited P*  Right rotation   Left rotation    (Blank rows = not tested)  LOWER EXTREMITY ROM:     wfl  LOWER EXTREMITY MMT:    MMT Right eval Left eval  Hip flexion 3+ 3+  Hip extension    Hip abduction 5 5  Hip adduction 5 5  Hip internal rotation    Hip external rotation    Knee flexion 5 5  Knee extension 5 5  Ankle dorsiflexion    Ankle plantarflexion    Ankle inversion    Ankle eversion     (Blank rows = not tested)  LUMBAR SPECIAL TESTS:  Slump test: Negative  FUNCTIONAL TESTS:  Timed up and go (TUG): 16.16    Item Test date: 02/15/24 Date:  Date:   Sitting to standing 2. able to stand using hands after several tries Insert SmartPhrase OPRCBERGREEVAL Insert SmartPhrase OPRCBERGREEVAL  2. Standing unsupported 4. able to stand safely for 2 minutes    3. Sitting with back unsupported, feet supported 4. able to sit safely and securely for 2 minutes    4. Standing to sitting 3. controls descent by using hands    5. Pivot transfer  3. able to transfer safely with definite need of hands    6. Standing unsupported with eyes closed 4. able to stand 10 seconds safely    7. Standing unsupported with feet together 4. able to place feet together independently and stand 1 minute safely    8. Reaching forward with outstretched arms while standing 3. can reach forward 12 cm (5 inches)    9. Pick up object from the floor from standing 3. able to pick up slipper but needs supervision    10. Turning to look behind over left and right shoulders while standing 4. looks behind from both sides and weight shifts well    11. Turn 360 degrees 2. able to turn 360 degrees safely but slowly    12. Place alternate foot on step or stool while standing unsupported 0. needs assistance to keep from  falling/unable to try    13. Standing unsupported one foot in front 0. loses balance while stepping or standing    14. Standing on one leg 0. unable to try of needs assist to prevent fall      Total Score 36/56 Total Score:    Total Score:     GAIT: Distance walked: 400 ft Assistive device utilized: None Level of assistance: HHA of dtr Comments: off load left, increased lateral displacement  TREATMENT  Eval Self care:Posture and body curator instruction. Use of AD (SPC vs rollator); frequency of exercise weekly, walking, returning to completing HEP as instructed by therapy (shoulder and c-spine dx) beginning of year.  PATIENT EDUCATION:  Education details: Discussed eval findings, rehab rationale, aquatic program progression/POC and pools in area. Patient is in agreement  Person educated: Patient and Child(ren) Dtr [place] Education method: Explanation Education comprehension: verbalized understanding  HOME EXERCISE PROGRAM: Aquatic TBA  ASSESSMENT:  CLINICAL IMPRESSION: Patient is a 74 y.o. f who was seen today for physical therapy evaluation and treatment for LBP.  She arrives with dtr whom assists her as well as lives with her.  Pt reports she is very sedentary.  Has had LBP for 5 yrs with exacerbation after long car ride over holidays.  Patient presents with pain limited deficits in  LB with some radiation into right hip in strength, ROM, endurance, activity tolerance, gait, balance, and functional mobility with ADL's. She is having to modify and restrict ADL's as indicated by outcome measure score as well as subjective information and objective measures which is affecting overall participation. In part some of her issues likely stem for chronic inactivity. Patient will benefit from skilled physical therapy in order to improve function and reduce impairment. Plan  to begin in aquatics and transition to land as approp.    OBJECTIVE IMPAIRMENTS: Abnormal gait, decreased activity tolerance, decreased balance, decreased endurance, decreased knowledge of use of DME, decreased mobility, difficulty walking, decreased strength, postural dysfunction, obesity, and pain.   ACTIVITY LIMITATIONS: carrying, lifting, bending, standing, squatting, stairs, transfers, bed mobility, and locomotion level  PARTICIPATION LIMITATIONS: meal prep, cleaning, laundry, shopping, and community activity  PERSONAL FACTORS: Age, Behavior pattern, Fitness, and 1-2 comorbidities: see Pmhx are also affecting patient's functional outcome.   REHAB POTENTIAL: Good  CLINICAL DECISION MAKING: Stable/uncomplicated  EVALUATION COMPLEXITY: Low   GOALS: Goals reviewed with patient? Yes  SHORT TERM GOALS: Target date: 03/17/24  Pt will tolerate full aquatic sessions consistently without increase in pain and with improving function to demonstrate good toleration and effectiveness of intervention.  Baseline: Goal status: INITIAL  2.  Pt will tolerate walking to and from setting and engaging in aquatic therapy session without excessive fatigue or increase in pain to demonstrate improved toleration to activity.  Baseline:  Goal status: INITIAL  3.  Pt will report compliance of daily walking at least 10 minutes (5 days a week) Baseline:  Goal status: INITIAL  4.   Pt will report improvement with transfers getting out of bed with 50% improvement Baseline:  Goal status: INITIAL    LONG TERM GOALS: Target date: 04/12/24  Pt to improve on ODI by 13% to demonstrate statistically significant Improvement in function. (MCID 13-15%) Baseline: 22/50=44% Goal status: INITIAL  2.  Pt will improve on Berg balance test to >/= 45/56 to demonstrate a decrease in fall risk. (MCD 7) Baseline: 36/56 Goal status: INITIAL  3. Pt will improve strength in hip flex by at least 1 grade to demonstrate  improved overall physical function Baseline:  Goal status: INITIAL  4.  Pt will report decrease in pain by at least 50% for improved toleration to activity/quality of life and to demonstrate improved management of pain. Baseline:  Goal status: INITIAL  5.  Pt will report improvement in activity tolerance/energy level by 75% Baseline:  Goal status: INITIAL  6.  Pt will be indep with final HEP's (land and aquatic as appropriate) for continued management of condition Baseline:  Goal status: INITIAL  PLAN:  PT FREQUENCY: 2x/week  PT DURATION: 8 weeks  PLANNED INTERVENTIONS: [id number]- PT Re-evaluation, 97750- Physical Performance Testing, 97110-Therapeutic exercises, 97530- Therapeutic activity, [id number]- Neuromuscular re-education, 97535- Self  Care, 02859- Manual therapy, [phone]- Gait training, [phone]- Aquatic Therapy, [phone]- Ionotophoresis 4mg /ml Dexamethasone , 20560 (1-2 muscles), 20561 (3+ muscles)- Dry Needling, Patient/Family education, Balance training, Stair training, Taping, Joint mobilization, DME instructions, Cryotherapy, and Moist heat.  PLAN FOR NEXT SESSION: Aquatics only to begin: le strengthening, core strengthening, balance and proprioception training, pt edu on exercise importance, pain management; HEP   Ronal Kem) Mirielle Byrum MPT 02/15/2024 1:14 PM [facility] [address] [place], [place], [address] Phone: [phone]   Fax:  [phone]   "

## 2024-02-15 ENCOUNTER — Encounter (HOSPITAL_BASED_OUTPATIENT_CLINIC_OR_DEPARTMENT_OTHER): Payer: Self-pay | Admitting: Physical Therapy

## 2024-02-15 ENCOUNTER — Other Ambulatory Visit: Payer: Self-pay

## 2024-02-15 ENCOUNTER — Ambulatory Visit (HOSPITAL_BASED_OUTPATIENT_CLINIC_OR_DEPARTMENT_OTHER): Attending: Sports Medicine | Admitting: Physical Therapy

## 2024-02-15 DIAGNOSIS — X58XXXD Exposure to other specified factors, subsequent encounter: Secondary | ICD-10-CM | POA: Diagnosis not present

## 2024-02-15 DIAGNOSIS — M5136 Other intervertebral disc degeneration, lumbar region with discogenic back pain only: Secondary | ICD-10-CM | POA: Diagnosis not present

## 2024-02-15 DIAGNOSIS — M6281 Muscle weakness (generalized): Secondary | ICD-10-CM | POA: Diagnosis not present

## 2024-02-15 DIAGNOSIS — S32050D Wedge compression fracture of fifth lumbar vertebra, subsequent encounter for fracture with routine healing: Secondary | ICD-10-CM | POA: Diagnosis present

## 2024-02-15 DIAGNOSIS — G8929 Other chronic pain: Secondary | ICD-10-CM | POA: Insufficient documentation

## 2024-02-15 DIAGNOSIS — R2689 Other abnormalities of gait and mobility: Secondary | ICD-10-CM | POA: Diagnosis not present

## 2024-02-15 DIAGNOSIS — M5459 Other low back pain: Secondary | ICD-10-CM

## 2024-02-15 DIAGNOSIS — M545 Low back pain, unspecified: Secondary | ICD-10-CM | POA: Diagnosis present

## 2024-02-21 ENCOUNTER — Ambulatory Visit (HOSPITAL_BASED_OUTPATIENT_CLINIC_OR_DEPARTMENT_OTHER): Admitting: Physical Therapy

## 2024-02-21 ENCOUNTER — Encounter (HOSPITAL_BASED_OUTPATIENT_CLINIC_OR_DEPARTMENT_OTHER): Payer: Self-pay | Admitting: Physical Therapy

## 2024-02-21 DIAGNOSIS — M5459 Other low back pain: Secondary | ICD-10-CM

## 2024-02-21 DIAGNOSIS — M6281 Muscle weakness (generalized): Secondary | ICD-10-CM

## 2024-02-21 DIAGNOSIS — S32050D Wedge compression fracture of fifth lumbar vertebra, subsequent encounter for fracture with routine healing: Secondary | ICD-10-CM | POA: Diagnosis not present

## 2024-02-21 DIAGNOSIS — R2689 Other abnormalities of gait and mobility: Secondary | ICD-10-CM

## 2024-02-21 NOTE — Therapy (Signed)
 " OUTPATIENT PHYSICAL THERAPY THORACOLUMBAR TREATMENT   Patient Name: Stacey Page MRN: [id number] DOB:06-Feb-1951, 74 y.o., female Today's Date: 02/21/2024  END OF SESSION:  PT End of Session - 02/21/24 1141     Visit Number 2    Date for Recertification  04/12/24    Authorization Type HTA mcr    PT Start Time 1141    PT Stop Time 1220    PT Time Calculation (min) 39 min          Past Medical History:  Diagnosis Date   Anxiety    doesn't take any meds   Arthritis of right knee 03/14/2016   Asthma    Back pain    Chronic diastolic CHF (congestive heart failure) (HCC)    HF with Preserved EF (60-65%) - Grade II Diastolic Dysfunction (Hypertensive Heart Disease). takes Furosemide  daily   COPD (chronic obstructive pulmonary disease) (HCC)    Depression    doesn't take meds   Diabetes (HCC)    takes Januvia  daily   Eczema    uses cream as needed   GERD (gastroesophageal reflux disease)    History of bronchitis as a child    HTN (hypertension)    Insomnia    Moderate aortic stenosis by prior echocardiogram 04/2017   10/15/2021: Leanor calcification with moderate AS, MG 23 mmHg   Mycobacterium avium-intracellulare infection (HCC) 2016   OA (osteoarthritis)    Obese    OSA (obstructive sleep apnea) 02/25/2014   wears CPAP at night   Peripheral neuropathy    takes Gabapentin  as needed   Pneumonia    hx of-2010   Seasonal allergies    uses Flonase  daily   Sleep apnea    Past Surgical History:  Procedure Laterality Date   BREAST BIOPSY Right 03/2018   BREAST LUMPECTOMY WITH RADIOACTIVE SEED LOCALIZATION Right 12/28/2018   Procedure: RIGHT BREAST LUMPECTOMY WITH RADIOACTIVE SEED LOCALIZATION;  Surgeon: Curvin Deward MOULD, MD;  Location: [facility] OR;  Service: General;  Laterality: Right;   BREAST SURGERY     CARDIOPULMONARY EXERCISE TEST (CPX)  06/21/2021   (Performed on Coreg  25 mg twice daily): PFTs: FVC 1.76 (60%), FEV1 1.59 (77%), ratio 114% => RESTRICTIVE PHYSIOLOGY; 5  min- 1 mph - 2% grade. 7/10 dyspnea -> pulse ox range during exercise 95 to 98%, low of 90%, 97% on recovery; BP 106/84-160/64; heart rate 64-90 bpm (60% MPHR) -> Chronotropic Incompetence;;MAIN FACTOR for Exercise Limitation = BODY HABITUS w/ Chronotropic Incompetence.   CESAREAN SECTION  x2   COLONOSCOPY     CORONARY CALCIUM  SCORE & CT ANGIOGRAM  09/2017   Coronary Ca Score = 11 (low).  CTA- no obstructive CAD (minimal disease)   JOINT REPLACEMENT     KNEE ARTHROSCOPY Right    LUNG BIOPSY Right 06/10/2014   Procedure: LUNG BIOPSY;  Surgeon: Maude Fleeta Ochoa, MD;  Location: [facility] OR;  Service: Thoracic;  Laterality: Right;   POLYPECTOMY     throat   RIGHT HEART CATH N/A 10/15/2021   Procedure: RIGHT HEART CATH;  Surgeon: Claudene Victory ORN, MD;  Location: [facility] INVASIVE CV LAB;  Service: CV: Mild Pulmonary Hypertension (WHO Group 3).  Mean PAP 28 mmHg, PCWP 13 mmHg.  PVR 2.73 [place].  RAP 6 mmHg.CO-CI 5.5L/min, 2.17 L/min/m.   TOTAL KNEE ARTHROPLASTY Right 03/14/2016   Procedure: RIGHT TOTAL KNEE ARTHROPLASTY;  Surgeon: Oneil JAYSON Herald, MD;  Location: [facility] OR;  Service: Orthopedics;  Laterality: Right;   TRANSTHORACIC ECHOCARDIOGRAM  10/10/2021   EF  70 to 75%.  Hyperdynamic with no RWMA.  Moderate LVH.  Moderate LA dilation indicating likely diastolic dysfunction although not interpretable.  Moderately elevated PAP with normal RV function, dilated IVC with elevated RAP estimated at 15 mmHg..  Moderate MAC with no MR.  AOV calcification with Moderate AS-mean AvG 23 mmHg.   TRANSTHORACIC ECHOCARDIOGRAM  06/14/2021   EF 60 to 65%.  No RWMA.  Moderate cLVH with GR 1 DD.  Mild LA dilation.  Mild to moderate aortic calcification/stenosis.  Mean AVG 18 mmHg.   VIDEO ASSISTED THORACOSCOPY Right 06/10/2014   Procedure: VIDEO ASSISTED THORACOSCOPY;  Surgeon: Maude Fleeta Ochoa, MD;  Location: [facility] OR;  Service: Thoracic;  Laterality: Right;   Patient Active Problem List   Diagnosis Date Noted   Morbid obesity (HCC),  starting BMI 55.52 07/10/2023   Right foot pain 07/10/2023   Subacromial bursitis of right shoulder joint 04/18/2023   Asthma 03/03/2023   Urinary frequency 02/03/2023   Viral upper respiratory tract infection 12/20/2022   Dysthymia 02/08/2022   Hypokalemia 01/26/2022   Mild pulmonary hypertension (HCC) 01/09/2022   Need for influenza vaccination 12/02/2021   Dysfunction of left eustachian tube 12/02/2021   Allergic rhinitis 12/02/2021   Chronic respiratory failure with hypoxia (HCC) 11/11/2021   Demand ischemia (HCC)    Respiratory acidosis    Acute respiratory failure with hypercapnia (HCC)    Acute on chronic heart failure with preserved ejection fraction (HCC)    CHF (congestive heart failure) (HCC) 10/09/2021   Reactive airway disease 09/21/2021   Viral syndrome 09/21/2021   Hypertensive retinopathy of right eye, grade 1 08/11/2021   Grade 2 hypertensive retinopathy, left 08/11/2021   Nuclear sclerotic cataract of both eyes 08/11/2021   Need for shingles vaccine 07/14/2021   Iron deficiency anemia 01/13/2021   Stage 4 chronic kidney disease (HCC) 07/09/2019   Type 2 diabetes mellitus with obesity 07/02/2019   Polyneuropathy associated with underlying disease 11/28/2018   Severe episode of recurrent major depressive disorder, without psychotic features (HCC) 11/28/2018   Type 2 diabetes mellitus with diabetic peripheral angiopathy without gangrene, without long-term current use of insulin  (HCC) 11/28/2018   Poor short term memory 11/28/2018   Moderate aortic stenosis by prior echocardiogram 11/06/2018   Diabetes mellitus (HCC) 03/21/2017   Acute renal failure superimposed on stage 2 chronic kidney disease 03/17/2016   Symptomatic anemia 03/17/2016   S/P total knee arthroplasty, right 03/17/2016   Unilateral primary osteoarthritis, right knee    Hyperlipidemia associated with type 2 diabetes mellitus (HCC) 12/15/2015   Pain in both lower extremities 11/19/2015   Numbness in  both hands 08/13/2015   Panic attack 06/09/2015   Right knee pain 05/14/2015   Chronic renal insufficiency 09/16/2014   Pneumothorax on right    Atypical mycobacterium infection    ILD (interstitial lung disease) 2/2 MAI s/p med rxn    Class 3 severe obesity with serious comorbidity and body mass index (BMI) of 50.0 to 59.9 in adult (HCC) 04/03/2014   OSA (obstructive sleep apnea) 02/25/2014   Hypertensive heart disease with chronic diastolic congestive heart failure (HCC) 02/25/2014   DOE (dyspnea on exertion) 02/25/2014   Essential hypertension 02/25/2014    PCP: Elsie Sim Lent MD  REFERRING PROVIDER: Burnetta Brunet, DO   REFERRING DIAG:  [phone] (ICD-10-CM) - Chronic low back pain, unspecified back pain laterality, unspecified whether sciatica present  M51.360 (ICD-10-CM) - Degeneration of intervertebral disc of lumbar region with discogenic back pain  S32.050A (ICD-10-CM) -  Compression fracture of L5 vertebra, initial encounter (HCC)    Rationale for Evaluation and Treatment: Rehabilitation  THERAPY DIAG:  Other low back pain  Muscle weakness (generalized)  Other abnormalities of gait and mobility  ONSET DATE: few months exacerbation  SUBJECTIVE:                                                                                                                                                                                           SUBJECTIVE STATEMENT: Pt reports that her Lt knee is bothering her today.  No other changes since evaluation.  Pt's daughter, Orland, present on deck during session.    Initial subjective: Pt had tightness and pain in her right lower back since a car ride to and from [place] . Have had LBP for the past 5 years.  Had therapy for it and it helped finished, about 6 months ago. Pt arrives with dtr whom she lives with.  She reports she is sedentary.  Says her pain from recent trip has reduced since dtr has been making her walk. Occasional N&T  bottom of right foot due to peripheral neuropathies.  Pain stays in the middle and to the right  PERTINENT HISTORY:  Aquatic and land-based PT for chronic low back pain and lumbar DDD.   - 1. Chronic low back pain, unspecified back pain laterality, unspecified whether sciatica present   2. Degeneration of intervertebral disc of lumbar region with discogenic back pain   3. Compression fracture of L5 vertebra, initial encounter (HCC)   4. Osteopenia of neck of right femur   R TKR  PAIN:  Are you having pain? Yes: NPRS scale: 6-7/10 Pain location: lower back, Lt knee Pain description: ache Aggravating factors: first thing in am, staying in one position Relieving factors: rest  PRECAUTIONS: None  RED FLAGS: None   WEIGHT BEARING RESTRICTIONS: No  FALLS:  Has patient fallen in last 6 months? No  LIVING ENVIRONMENT: Lives with: lives with their family Lives in: House/apartment Has following equipment at home: Single point cane and Environmental Consultant - 4 wheeled  OCCUPATION: retired  PLOF: Independent  PATIENT GOALS: feel better, exercises better, getting out of bed and sitting on couch, endurance  NEXT MD VISIT: as needed  OBJECTIVE:  Note: Objective measures were completed at Evaluation unless otherwise noted.  DIAGNOSTIC FINDINGS:  XR Lumbar spijne 11/25 X-rays demonstrate no significant  scoliosis.  There is severe degenerative disc disease at the L4-L5 and  L5-S1 level.  There is a mild compression deformity of L5 which is about  10% of the disc space, likely degenerative in nature.  There is endplate  bony sclerosis from her DDD.  There is aortic calcification noted in  addition.   PATIENT SURVEYS:  ODI:22/50=44%  COGNITION: Overall cognitive status: Within functional limits for tasks assessed     SENSATION: Ocassional N&T plantar aspect right foot  MUSCLE LENGTH: Hamstrings: Right tight tested in sitting   POSTURE: Body habitus creates difficulty to assess well  increased lumbar lordosis , left knee valgus  PALPATION: TTP lumbar paraspinals  LUMBAR ROM:   AROM eval  Flexion Full *P  Extension Full P*  Right lateral flexion 50% limited P!   Left lateral flexion 50% limited P*  Right rotation   Left rotation    (Blank rows = not tested)  LOWER EXTREMITY ROM:     wfl  LOWER EXTREMITY MMT:    MMT Right eval Left eval  Hip flexion 3+ 3+  Hip extension    Hip abduction 5 5  Hip adduction 5 5  Hip internal rotation    Hip external rotation    Knee flexion 5 5  Knee extension 5 5  Ankle dorsiflexion    Ankle plantarflexion    Ankle inversion    Ankle eversion     (Blank rows = not tested)  LUMBAR SPECIAL TESTS:  Slump test: Negative  FUNCTIONAL TESTS:  Timed up and go (TUG): 16.16    Item Test date: 02/15/24 Date:  Date:   Sitting to standing 2. able to stand using hands after several tries Insert SmartPhrase OPRCBERGREEVAL Insert SmartPhrase OPRCBERGREEVAL  2. Standing unsupported 4. able to stand safely for 2 minutes    3. Sitting with back unsupported, feet supported 4. able to sit safely and securely for 2 minutes    4. Standing to sitting 3. controls descent by using hands    5. Pivot transfer  3. able to transfer safely with definite need of hands    6. Standing unsupported with eyes closed 4. able to stand 10 seconds safely    7. Standing unsupported with feet together 4. able to place feet together independently and stand 1 minute safely    8. Reaching forward with outstretched arms while standing 3. can reach forward 12 cm (5 inches)    9. Pick up object from the floor from standing 3. able to pick up slipper but needs supervision    10. Turning to look behind over left and right shoulders while standing 4. looks behind from both sides and weight shifts well    11. Turn 360 degrees 2. able to turn 360 degrees safely but slowly    12. Place alternate foot on step or stool while standing unsupported 0. needs assistance  to keep from falling/unable to try    13. Standing unsupported one foot in front 0. loses balance while stepping or standing    14. Standing on one leg 0. unable to try of needs assist to prevent fall      Total Score 36/56 Total Score:    Total Score:     GAIT: Distance walked: 400 ft Assistive device utilized: None Level of assistance: HHA of dtr Comments: off load left, increased lateral displacement  TREATMENT  [facility] Adult PT Treatment:                                             Date: 02/21/24 Pt seen for aquatic therapy today.  Treatment took place  in water  3.5-4.75 ft in depth at the [facility] pool. Temp of water  was 91.  Pt entered/exited the pool via stairs with bil rail.  - Intro to aquatic therapy principles - UE on barbell walking forward and side stepping  - UE on yellow hand floats:  side stepping -> small trial of horiz abdct/add;  walking backward; forward marching - UE on wall: hip add/abd 2x5  toe/heel raises x10; ; hip flexion /extension x 10; relaxed vertical squats x10 - marching forward/backward with reciprocal row motion with yellow hand floats   Pt requires the buoyancy and hydrostatic pressure of water  for support, and to offload joints by unweighting joint load by at least 50 % in navel deep water  and by at least 75-80% in chest to neck deep water .  Viscosity of the water  is needed for resistance of strengthening. Water  current perturbations provides challenge to standing balance requiring increased core activation.                                                                                                                                  PATIENT EDUCATION:  Education details: intro to aquatic therapy Person educated: Patient and Child(ren) Dtr Orland Education method: Explanation, demonstration Education comprehension: verbalized understanding  HOME EXERCISE PROGRAM: Aquatic TBA  ASSESSMENT:  CLINICAL IMPRESSION: Pt demonstrates safety  and independence in aquatic setting with therapist instructing from deck. Pt initially guarded with gait in water , requiring UE support on floatation.  Pt's confidence quickly improves and pt was able to walk without UE support with good balance by end of session.  Pt reported reduction of pain to 4-5/10 by end of session.  Pt is provided VC and demonstration for execution of exercises throughout session and monitored for toleration.  Goals are ongoing.     Initial impression:  Patient is a 74 y.o. f who was seen today for physical therapy evaluation and treatment for LBP.  She arrives with dtr whom assists her as well as lives with her.  Pt reports she is very sedentary.  Has had LBP for 5 yrs with exacerbation after long car ride over holidays.  Patient presents with pain limited deficits in  LB with some radiation into right hip in strength, ROM, endurance, activity tolerance, gait, balance, and functional mobility with ADL's. She is having to modify and restrict ADL's as indicated by outcome measure score as well as subjective information and objective measures which is affecting overall participation. In part some of her issues likely stem for chronic inactivity. Patient will benefit from skilled physical therapy in order to improve function and reduce impairment. Plan to begin in aquatics and transition to land as approp.    OBJECTIVE IMPAIRMENTS: Abnormal gait, decreased activity tolerance, decreased balance, decreased endurance, decreased knowledge of use of DME, decreased mobility, difficulty walking, decreased strength, postural dysfunction, obesity, and pain.   ACTIVITY LIMITATIONS: carrying, lifting, bending, standing, squatting, stairs, transfers, bed  mobility, and locomotion level  PARTICIPATION LIMITATIONS: meal prep, cleaning, laundry, shopping, and community activity  PERSONAL FACTORS: Age, Behavior pattern, Fitness, and 1-2 comorbidities: see Pmhx are also affecting patient's  functional outcome.   REHAB POTENTIAL: Good  CLINICAL DECISION MAKING: Stable/uncomplicated  EVALUATION COMPLEXITY: Low   GOALS: Goals reviewed with patient? Yes  SHORT TERM GOALS: Target date: 03/17/24  Pt will tolerate full aquatic sessions consistently without increase in pain and with improving function to demonstrate good toleration and effectiveness of intervention.  Baseline: Goal status: INITIAL  2.  Pt will tolerate walking to and from setting and engaging in aquatic therapy session without excessive fatigue or increase in pain to demonstrate improved toleration to activity.  Baseline:  Goal status: INITIAL  3.  Pt will report compliance of daily walking at least 10 minutes (5 days a week) Baseline:  Goal status: INITIAL  4.   Pt will report improvement with transfers getting out of bed with 50% improvement Baseline:  Goal status: INITIAL    LONG TERM GOALS: Target date: 04/12/24  Pt to improve on ODI by 13% to demonstrate statistically significant Improvement in function. (MCID 13-15%) Baseline: 22/50=44% Goal status: INITIAL  2.  Pt will improve on Berg balance test to >/= 45/56 to demonstrate a decrease in fall risk. (MCD 7) Baseline: 36/56 Goal status: INITIAL  3. Pt will improve strength in hip flex by at least 1 grade to demonstrate improved overall physical function Baseline:  Goal status: INITIAL  4.  Pt will report decrease in pain by at least 50% for improved toleration to activity/quality of life and to demonstrate improved management of pain. Baseline:  Goal status: INITIAL  5.  Pt will report improvement in activity tolerance/energy level by 75% Baseline:  Goal status: INITIAL  6.  Pt will be indep with final HEP's (land and aquatic as appropriate) for continued management of condition Baseline:  Goal status: INITIAL  PLAN:  PT FREQUENCY: 2x/week  PT DURATION: 8 weeks  PLANNED INTERVENTIONS: [id number]- PT Re-evaluation, 97750- Physical  Performance Testing, 97110-Therapeutic exercises, 97530- Therapeutic activity, 97112- Neuromuscular re-education, 97535- Self Care, [id number]- Manual therapy, [id number]- Gait training, [phone]- Aquatic Therapy, [phone]- Ionotophoresis 4mg /ml Dexamethasone , 79439 (1-2 muscles), [id number] (3+ muscles)- Dry Needling, Patient/Family education, Balance training, Stair training, Taping, Joint mobilization, DME instructions, Cryotherapy, and Moist heat.  PLAN FOR NEXT SESSION: Aquatics only to begin: le strengthening, core strengthening, balance and proprioception training, pt edu on exercise importance, pain management; HEP  Delon Aquas, PTA 02/21/2024 12:26 PM [facility] [address] [place], [place], [address] Phone: [phone]   Fax:  [phone]   "

## 2024-02-27 ENCOUNTER — Ambulatory Visit (INDEPENDENT_AMBULATORY_CARE_PROVIDER_SITE_OTHER): Admitting: Adult Health

## 2024-02-27 ENCOUNTER — Encounter (INDEPENDENT_AMBULATORY_CARE_PROVIDER_SITE_OTHER): Payer: Self-pay | Admitting: Adult Health

## 2024-02-27 VITALS — BP 101/73 | HR 76 | Temp 98.7°F | Ht 66.0 in | Wt 308.0 lb

## 2024-02-27 DIAGNOSIS — E669 Obesity, unspecified: Secondary | ICD-10-CM | POA: Diagnosis not present

## 2024-02-27 DIAGNOSIS — E559 Vitamin D deficiency, unspecified: Secondary | ICD-10-CM | POA: Diagnosis not present

## 2024-02-27 DIAGNOSIS — E1169 Type 2 diabetes mellitus with other specified complication: Secondary | ICD-10-CM

## 2024-02-27 DIAGNOSIS — Z6841 Body Mass Index (BMI) 40.0 and over, adult: Secondary | ICD-10-CM | POA: Diagnosis not present

## 2024-02-27 DIAGNOSIS — N184 Chronic kidney disease, stage 4 (severe): Secondary | ICD-10-CM | POA: Diagnosis not present

## 2024-02-27 DIAGNOSIS — E1122 Type 2 diabetes mellitus with diabetic chronic kidney disease: Secondary | ICD-10-CM | POA: Diagnosis not present

## 2024-02-27 DIAGNOSIS — Z7985 Long-term (current) use of injectable non-insulin antidiabetic drugs: Secondary | ICD-10-CM | POA: Diagnosis not present

## 2024-02-27 NOTE — Progress Notes (Signed)
 "    WEIGHT SUMMARY AND BIOMETRICS  No data recorded Anthropometric Measurements Height: 5' 6 (1.676 m) Weight at Last Visit: 309lb Starting Weight: 344lb   No data recorded Other Clinical Data Fasting: No Labs: no Today's Visit #: 6 Starting Date: 03/25/19    Chief Complaint:   OBESITY Stacey Page is here to discuss her progress with her obesity treatment plan.  She is on the the Category 2 Plan and states she is following her eating plan approximately 50 % of the time.  She states she is exercising Walking 10 minutes 3 times per week.  Interim History:  Last OV at [facility] was 01/11/2024 PCP completed labs 01/09/2024- reviewed with pt Chronic f/u with established Pulmonologist on 01/18/2024- SBP quite elevated at that OV BP stable at OV today She is accompanied by her daughter today  Reviewed Bioimpedance Results with pt: Muscle Mass: +1.4 lbs Adipose Mass: -2.6 lbs  She has started water  aerobics every Thursday at [facility]- she has greatly enjoyed the classes.  Subjective:   1. Vitamin D  deficiency  Latest Reference Range & Units 12/12/22 15:47 05/02/23 09:20 10/05/23 12:32  Vitamin D , 25-Hydroxy 30.0 - 100.0 ng/mL 36.8 41.7 36.0   She is taking OTC Vit D3 3000 international units daily She endorses stable energy levels  2. Stage 4 chronic kidney disease (HCC)  PCP recently checked renal function- stable BP at goal at OV today   Latest Reference Range & Units 01/09/24 10:19  Creatinine 0.40 - 1.20 mg/dL 8.19 (H)  (H): Data is abnormally high   Latest Reference Range & Units 01/09/24 10:19  GFR >60.00 mL/min 27.87 (L)  (L): Data is abnormally low  3. Type 2 diabetes mellitus with other specified complication, without long-term current use of insulin  (HCC)  Latest Reference Range & Units 01/09/24 10:19  Glucose 70 - 99 mg/dL 88  Hemoglobin J8R 4.6 - 6.5 % 5.6   She is currently on daily Farxiga  5mg  (managed by PCP) and weekly Ozempic  1mg  (managed by  [facility]). Ozempic  increased to 1mg  on/about 02/28/2023  She is not checking home CBG, however denies sx's of hypogylcemia and she reports keeping glucose tabs on her person at all times. Denies mass in neck, dysphagia, dyspepsia, persistent hoarseness, abdominal pain, or N/V/C   Assessment/Plan:   1. Vitamin D  deficiency Continue current supplementation and monitor labs  2. Stage 4 chronic kidney disease (HCC) (Primary) Continue to avoid nephrotoxic substances F/u with established Nephrologist next week  3. Type 2 diabetes mellitus with other specified complication, without long-term current use of insulin  (HCC) Continue healthy eating and regular exercise Continue current antidiabetic medications- denies need for Ozempic  1mg  refill today  4. Current BMI 50.0  Stacey Page is currently in the action stage of change. As such, her goal is to continue with weight loss efforts. She has agreed to the Category 2 Plan.   Exercise goals: Older adults should follow the adult guidelines. When older adults cannot meet the adult guidelines, they should be as physically active as their abilities and conditions will allow.  Older adults should do exercises that maintain or improve balance if they are at risk of falling.  Older adults should determine their level of effort for physical activity relative to their level of fitness.  Older adults with chronic conditions should understand whether and how their conditions affect their ability to do regular physical activity safely.  Behavioral modification strategies: increasing lean protein intake, decreasing simple carbohydrates, increasing vegetables, increasing water  intake, no skipping  meals, meal planning and cooking strategies, keeping healthy foods in the home, ways to avoid boredom eating, and planning for success.  Stacey Page has agreed to follow-up with our clinic in 4 weeks. She was informed of the importance of frequent follow-up visits to maximize her  success with intensive lifestyle modifications for her multiple health conditions.   Objective:   Height 5' 6 (1.676 m). Body mass index is 51 kg/m.  General: Cooperative, alert, well developed, in no acute distress. HEENT: Conjunctivae and lids unremarkable. Cardiovascular: Regular rhythm.  Lungs: Normal work of breathing. Neurologic: No focal deficits.   Lab Results  Component Value Date   CREATININE 1.80 (H) 01/09/2024   BUN 23 01/09/2024   NA 141 01/09/2024   K 4.4 01/09/2024   CL 102 01/09/2024   CO2 34 (H) 01/09/2024   Lab Results  Component Value Date   ALT 9 10/05/2023   AST 11 10/05/2023   ALKPHOS 78 10/05/2023   BILITOT 0.4 10/05/2023   Lab Results  Component Value Date   HGBA1C 5.6 01/09/2024   HGBA1C 5.7 (H) 10/05/2023   HGBA1C 5.7 (H) 05/02/2023   HGBA1C 6.0 04/18/2023   HGBA1C 6.2 (H) 12/12/2022   Lab Results  Component Value Date   INSULIN  8.4 10/05/2023   INSULIN  6.9 05/02/2023   INSULIN  10.8 12/12/2022   INSULIN  8.6 10/28/2019   INSULIN  9.2 07/04/2019   Lab Results  Component Value Date   TSH 0.644 10/05/2023   Lab Results  Component Value Date   CHOL 144 12/12/2022   HDL 63 12/12/2022   LDLCALC 68 12/12/2022   TRIG 62 12/12/2022   CHOLHDL 2.3 12/12/2022   Lab Results  Component Value Date   VD25OH 36.0 10/05/2023   VD25OH 41.7 05/02/2023   VD25OH 36.8 12/12/2022   Lab Results  Component Value Date   WBC 4.3 02/03/2023   HGB 13.5 02/03/2023   HCT 42.3 02/03/2023   MCV 92.6 02/03/2023   PLT 206 02/03/2023   Lab Results  Component Value Date   IRON 51 02/03/2023   TIBC 266 02/03/2023   FERRITIN 70 02/03/2023   Attestation Statements:   Reviewed by clinician on day of visit: allergies, medications, problem list, medical history, surgical history, family history, social history, and previous encounter notes.  Time spent on visit including pre-visit chart review and post-visit care and charting was 27 minutes.   I have  reviewed the above documentation for accuracy and completeness, and I agree with the above. -  Cyndal Kasson d. Zaniah Titterington, NP-C "

## 2024-02-28 ENCOUNTER — Other Ambulatory Visit: Payer: Self-pay | Admitting: Family Medicine

## 2024-02-28 ENCOUNTER — Other Ambulatory Visit: Payer: Self-pay

## 2024-02-28 MED ORDER — PANTOPRAZOLE SODIUM 40 MG PO TBEC
40.0000 mg | DELAYED_RELEASE_TABLET | Freq: Every day | ORAL | 1 refills | Status: AC
  Filled 2024-02-28: qty 90, 90d supply, fill #0

## 2024-02-29 ENCOUNTER — Ambulatory Visit (HOSPITAL_BASED_OUTPATIENT_CLINIC_OR_DEPARTMENT_OTHER): Admitting: Physical Therapy

## 2024-02-29 ENCOUNTER — Encounter (HOSPITAL_BASED_OUTPATIENT_CLINIC_OR_DEPARTMENT_OTHER): Payer: Self-pay | Admitting: Physical Therapy

## 2024-02-29 DIAGNOSIS — M5459 Other low back pain: Secondary | ICD-10-CM

## 2024-02-29 DIAGNOSIS — R2689 Other abnormalities of gait and mobility: Secondary | ICD-10-CM

## 2024-02-29 DIAGNOSIS — M6281 Muscle weakness (generalized): Secondary | ICD-10-CM

## 2024-02-29 DIAGNOSIS — S32050D Wedge compression fracture of fifth lumbar vertebra, subsequent encounter for fracture with routine healing: Secondary | ICD-10-CM | POA: Diagnosis not present

## 2024-02-29 NOTE — Therapy (Signed)
 " OUTPATIENT PHYSICAL THERAPY THORACOLUMBAR TREATMENT   Patient Name: Stacey Page MRN: [id number] DOB:1950/05/29, 74 y.o., female Today's Date: 02/29/2024  END OF SESSION:  PT End of Session - 02/29/24 1139     Visit Number 3    Date for Recertification  04/12/24    Authorization Type HTA mcr    PT Start Time 1140    PT Stop Time 1220    PT Time Calculation (min) 40 min    Activity Tolerance Patient tolerated treatment well    Behavior During Therapy [facility] for tasks assessed/performed          Past Medical History:  Diagnosis Date   Anxiety    doesn't take any meds   Arthritis of right knee 03/14/2016   Asthma    Back pain    Chronic diastolic CHF (congestive heart failure) (HCC)    HF with Preserved EF (60-65%) - Grade II Diastolic Dysfunction (Hypertensive Heart Disease). takes Furosemide  daily   COPD (chronic obstructive pulmonary disease) (HCC)    Depression    doesn't take meds   Diabetes (HCC)    takes Januvia  daily   Eczema    uses cream as needed   GERD (gastroesophageal reflux disease)    History of bronchitis as a child    HTN (hypertension)    Insomnia    Moderate aortic stenosis by prior echocardiogram 04/2017   10/15/2021: Leanor calcification with moderate AS, MG 23 mmHg   Mycobacterium avium-intracellulare infection (HCC) 2016   OA (osteoarthritis)    Obese    OSA (obstructive sleep apnea) 02/25/2014   wears CPAP at night   Peripheral neuropathy    takes Gabapentin  as needed   Pneumonia    hx of-2010   Seasonal allergies    uses Flonase  daily   Sleep apnea    Past Surgical History:  Procedure Laterality Date   BREAST BIOPSY Right 03/2018   BREAST LUMPECTOMY WITH RADIOACTIVE SEED LOCALIZATION Right 12/28/2018   Procedure: RIGHT BREAST LUMPECTOMY WITH RADIOACTIVE SEED LOCALIZATION;  Surgeon: Curvin Deward MOULD, MD;  Location: [facility] OR;  Service: General;  Laterality: Right;   BREAST SURGERY     CARDIOPULMONARY EXERCISE TEST (CPX)  06/21/2021    (Performed on Coreg  25 mg twice daily): PFTs: FVC 1.76 (60%), FEV1 1.59 (77%), ratio 114% => RESTRICTIVE PHYSIOLOGY; 5 min- 1 mph - 2% grade. 7/10 dyspnea -> pulse ox range during exercise 95 to 98%, low of 90%, 97% on recovery; BP 106/84-160/64; heart rate 64-90 bpm (60% MPHR) -> Chronotropic Incompetence;;MAIN FACTOR for Exercise Limitation = BODY HABITUS w/ Chronotropic Incompetence.   CESAREAN SECTION  x2   COLONOSCOPY     CORONARY CALCIUM  SCORE & CT ANGIOGRAM  09/2017   Coronary Ca Score = 11 (low).  CTA- no obstructive CAD (minimal disease)   JOINT REPLACEMENT     KNEE ARTHROSCOPY Right    LUNG BIOPSY Right 06/10/2014   Procedure: LUNG BIOPSY;  Surgeon: Maude Fleeta Ochoa, MD;  Location: [facility] OR;  Service: Thoracic;  Laterality: Right;   POLYPECTOMY     throat   RIGHT HEART CATH N/A 10/15/2021   Procedure: RIGHT HEART CATH;  Surgeon: Claudene Victory ORN, MD;  Location: [facility] INVASIVE CV LAB;  Service: CV: Mild Pulmonary Hypertension (WHO Group 3).  Mean PAP 28 mmHg, PCWP 13 mmHg.  PVR 2.73 [place].  RAP 6 mmHg.CO-CI 5.5L/min, 2.17 L/min/m.   TOTAL KNEE ARTHROPLASTY Right 03/14/2016   Procedure: RIGHT TOTAL KNEE ARTHROPLASTY;  Surgeon: Oneil JAYSON Herald, MD;  Location: [facility] OR;  Service: Orthopedics;  Laterality: Right;   TRANSTHORACIC ECHOCARDIOGRAM  10/10/2021   EF 70 to 75%.  Hyperdynamic with no RWMA.  Moderate LVH.  Moderate LA dilation indicating likely diastolic dysfunction although not interpretable.  Moderately elevated PAP with normal RV function, dilated IVC with elevated RAP estimated at 15 mmHg..  Moderate MAC with no MR.  AOV calcification with Moderate AS-mean AvG 23 mmHg.   TRANSTHORACIC ECHOCARDIOGRAM  06/14/2021   EF 60 to 65%.  No RWMA.  Moderate cLVH with GR 1 DD.  Mild LA dilation.  Mild to moderate aortic calcification/stenosis.  Mean AVG 18 mmHg.   VIDEO ASSISTED THORACOSCOPY Right 06/10/2014   Procedure: VIDEO ASSISTED THORACOSCOPY;  Surgeon: Maude Fleeta Ochoa, MD;  Location: [facility] OR;   Service: Thoracic;  Laterality: Right;   Patient Active Problem List   Diagnosis Date Noted   Morbid obesity (HCC), starting BMI 55.52 07/10/2023   Right foot pain 07/10/2023   Subacromial bursitis of right shoulder joint 04/18/2023   Asthma 03/03/2023   Urinary frequency 02/03/2023   Viral upper respiratory tract infection 12/20/2022   Dysthymia 02/08/2022   Hypokalemia 01/26/2022   Mild pulmonary hypertension (HCC) 01/09/2022   Need for influenza vaccination 12/02/2021   Dysfunction of left eustachian tube 12/02/2021   Allergic rhinitis 12/02/2021   Chronic respiratory failure with hypoxia (HCC) 11/11/2021   Demand ischemia (HCC)    Respiratory acidosis    Acute respiratory failure with hypercapnia (HCC)    Acute on chronic heart failure with preserved ejection fraction (HCC)    CHF (congestive heart failure) (HCC) 10/09/2021   Reactive airway disease 09/21/2021   Viral syndrome 09/21/2021   Hypertensive retinopathy of right eye, grade 1 08/11/2021   Grade 2 hypertensive retinopathy, left 08/11/2021   Nuclear sclerotic cataract of both eyes 08/11/2021   Need for shingles vaccine 07/14/2021   Iron deficiency anemia 01/13/2021   Stage 4 chronic kidney disease (HCC) 07/09/2019   Type 2 diabetes mellitus with obesity 07/02/2019   Polyneuropathy associated with underlying disease 11/28/2018   Severe episode of recurrent major depressive disorder, without psychotic features (HCC) 11/28/2018   Type 2 diabetes mellitus with diabetic peripheral angiopathy without gangrene, without long-term current use of insulin  (HCC) 11/28/2018   Poor short term memory 11/28/2018   Moderate aortic stenosis by prior echocardiogram 11/06/2018   Diabetes mellitus (HCC) 03/21/2017   Acute renal failure superimposed on stage 2 chronic kidney disease 03/17/2016   Symptomatic anemia 03/17/2016   S/P total knee arthroplasty, right 03/17/2016   Unilateral primary osteoarthritis, right knee     Hyperlipidemia associated with type 2 diabetes mellitus (HCC) 12/15/2015   Pain in both lower extremities 11/19/2015   Numbness in both hands 08/13/2015   Panic attack 06/09/2015   Right knee pain 05/14/2015   Chronic renal insufficiency 09/16/2014   Pneumothorax on right    Atypical mycobacterium infection    ILD (interstitial lung disease) 2/2 MAI s/p med rxn    Class 3 severe obesity with serious comorbidity and body mass index (BMI) of 50.0 to 59.9 in adult (HCC) 04/03/2014   OSA (obstructive sleep apnea) 02/25/2014   Hypertensive heart disease with chronic diastolic congestive heart failure (HCC) 02/25/2014   DOE (dyspnea on exertion) 02/25/2014   Essential hypertension 02/25/2014    PCP: Elsie Sim Lent MD  REFERRING PROVIDER: Burnetta Brunet, DO   REFERRING DIAG:  [id number] (ICD-10-CM) - Chronic low back pain, unspecified back pain laterality, unspecified whether sciatica present  M51.360 (ICD-10-CM) - Degeneration of intervertebral disc of lumbar region with discogenic back pain  S32.050A (ICD-10-CM) - Compression fracture of L5 vertebra, initial encounter (HCC)    Rationale for Evaluation and Treatment: Rehabilitation  THERAPY DIAG:  Other low back pain  Muscle weakness (generalized)  Other abnormalities of gait and mobility  ONSET DATE: few months exacerbation  SUBJECTIVE:                                                                                                                                                                                           SUBJECTIVE STATEMENT: Pain in right shoulder and left knee 6/10. Pt reports good response from initial aquatic session stating she felt good for days.  Pt reports that her Lt knee is bothering her today.  No other changes since evaluation.  Pt's daughter, Orland, present on deck during session.    Initial subjective: Pt had tightness and pain in her right lower back since a car ride to and from [place] .  Have had LBP for the past 5 years.  Had therapy for it and it helped finished, about 6 months ago. Pt arrives with dtr whom she lives with.  She reports she is sedentary.  Says her pain from recent trip has reduced since dtr has been making her walk. Occasional N&T bottom of right foot due to peripheral neuropathies.  Pain stays in the middle and to the right  PERTINENT HISTORY:  Aquatic and land-based PT for chronic low back pain and lumbar DDD.   - 1. Chronic low back pain, unspecified back pain laterality, unspecified whether sciatica present   2. Degeneration of intervertebral disc of lumbar region with discogenic back pain   3. Compression fracture of L5 vertebra, initial encounter (HCC)   4. Osteopenia of neck of right femur   R TKR  PAIN:  Are you having pain? Yes: NPRS scale: 6-7/10 Pain location: lower back, Lt knee Pain description: ache Aggravating factors: first thing in am, staying in one position Relieving factors: rest  PRECAUTIONS: None  RED FLAGS: None   WEIGHT BEARING RESTRICTIONS: No  FALLS:  Has patient fallen in last 6 months? No  LIVING ENVIRONMENT: Lives with: lives with their family Lives in: House/apartment Has following equipment at home: Single point cane and Environmental Consultant - 4 wheeled  OCCUPATION: retired  PLOF: Independent  PATIENT GOALS: feel better, exercises better, getting out of bed and sitting on couch, endurance  NEXT MD VISIT: as needed  OBJECTIVE:  Note: Objective measures were completed at Evaluation unless otherwise noted.  DIAGNOSTIC FINDINGS:  XR Lumbar spijne 11/25 X-rays demonstrate no significant  scoliosis.  There is severe degenerative disc disease at the L4-L5 and  L5-S1 level.  There is a mild compression deformity of L5 which is about  10% of the disc space, likely degenerative in nature.  There is endplate  bony sclerosis from her DDD.  There is aortic calcification noted in  addition.   PATIENT SURVEYS:   ODI:22/50=44%  COGNITION: Overall cognitive status: Within functional limits for tasks assessed     SENSATION: Ocassional N&T plantar aspect right foot  MUSCLE LENGTH: Hamstrings: Right tight tested in sitting   POSTURE: Body habitus creates difficulty to assess well increased lumbar lordosis , left knee valgus  PALPATION: TTP lumbar paraspinals  LUMBAR ROM:   AROM eval  Flexion Full *P  Extension Full P*  Right lateral flexion 50% limited P!   Left lateral flexion 50% limited P*  Right rotation   Left rotation    (Blank rows = not tested)  LOWER EXTREMITY ROM:     wfl  LOWER EXTREMITY MMT:    MMT Right eval Left eval  Hip flexion 3+ 3+  Hip extension    Hip abduction 5 5  Hip adduction 5 5  Hip internal rotation    Hip external rotation    Knee flexion 5 5  Knee extension 5 5  Ankle dorsiflexion    Ankle plantarflexion    Ankle inversion    Ankle eversion     (Blank rows = not tested)  LUMBAR SPECIAL TESTS:  Slump test: Negative  FUNCTIONAL TESTS:  Timed up and go (TUG): 16.16    Item Test date: 02/15/24 Date:  Date:   Sitting to standing 2. able to stand using hands after several tries Insert SmartPhrase OPRCBERGREEVAL Insert SmartPhrase OPRCBERGREEVAL  2. Standing unsupported 4. able to stand safely for 2 minutes    3. Sitting with back unsupported, feet supported 4. able to sit safely and securely for 2 minutes    4. Standing to sitting 3. controls descent by using hands    5. Pivot transfer  3. able to transfer safely with definite need of hands    6. Standing unsupported with eyes closed 4. able to stand 10 seconds safely    7. Standing unsupported with feet together 4. able to place feet together independently and stand 1 minute safely    8. Reaching forward with outstretched arms while standing 3. can reach forward 12 cm (5 inches)    9. Pick up object from the floor from standing 3. able to pick up slipper but needs supervision    10.  Turning to look behind over left and right shoulders while standing 4. looks behind from both sides and weight shifts well    11. Turn 360 degrees 2. able to turn 360 degrees safely but slowly    12. Place alternate foot on step or stool while standing unsupported 0. needs assistance to keep from falling/unable to try    13. Standing unsupported one foot in front 0. loses balance while stepping or standing    14. Standing on one leg 0. unable to try of needs assist to prevent fall      Total Score 36/56 Total Score:    Total Score:     GAIT: Distance walked: 400 ft Assistive device utilized: None Level of assistance: HHA of dtr Comments: off load left, increased lateral displacement  TREATMENT  [facility] Adult PT Treatment:  Date: 02/29/24 Pt seen for aquatic therapy today.  Treatment took place in water  3.5-4.75 ft in depth at the [facility] pool. Temp of water  was 91.  Pt entered/exited the pool via stairs with bil rail.  - unsupported forward and backward amb in 4.3 ft->ue support yellow HB marching - UE support yellow HB walking side stepping->horizontal add/abd-shoulder add/abd  - bow & Arrow VC and demonstration provided - UE on wall: hip add/abd 10; toe/heel raises x10; relaxed vertical squats x10 - above repeated with ue support yellow HB: toe raises; heel raises; high knee marching x 10; hip abd x 5 - walking for recovery between exercises - seated on bench: cycling; hip add/abd; LAQ -stair tapping R/l with unilateral ue support on wall.   Pt requires the buoyancy and hydrostatic pressure of water  for support, and to offload joints by unweighting joint load by at least 50 % in navel deep water  and by at least 75-80% in chest to neck deep water .  Viscosity of the water  is needed for resistance of strengthening. Water  current perturbations provides challenge to standing balance requiring increased core activation.                                                                                                                                   PATIENT EDUCATION:  Education details: intro to aquatic therapy Person educated: Patient and Child(ren) Dtr Orland Education method: Explanation, demonstration Education comprehension: verbalized understanding  HOME EXERCISE PROGRAM: Aquatic TBA  ASSESSMENT:  CLINICAL IMPRESSION: Good response from 1st aquatic session reporting no pain nor excessive fatigue post session. Had good toleration to increased activity for core and extremity ms engagement.  Some focus on balance with pt having some apprehension and high frustration level as she struggles with perfect completion.  VC for progression of therapy and encouragement that all will improve. She is a good candidate for aquatic intervention and will benefit from the properties of water  to progress towards functional goals.      Initial impression:  Patient is a 74 y.o. f who was seen today for physical therapy evaluation and treatment for LBP.  She arrives with dtr whom assists her as well as lives with her.  Pt reports she is very sedentary.  Has had LBP for 5 yrs with exacerbation after long car ride over holidays.  Patient presents with pain limited deficits in  LB with some radiation into right hip in strength, ROM, endurance, activity tolerance, gait, balance, and functional mobility with ADL's. She is having to modify and restrict ADL's as indicated by outcome measure score as well as subjective information and objective measures which is affecting overall participation. In part some of her issues likely stem for chronic inactivity. Patient will benefit from skilled physical therapy in order to improve function and reduce impairment. Plan to begin in aquatics and transition to land as approp.    OBJECTIVE IMPAIRMENTS: Abnormal gait, decreased  activity tolerance, decreased balance, decreased endurance, decreased knowledge of use  of DME, decreased mobility, difficulty walking, decreased strength, postural dysfunction, obesity, and pain.   ACTIVITY LIMITATIONS: carrying, lifting, bending, standing, squatting, stairs, transfers, bed mobility, and locomotion level  PARTICIPATION LIMITATIONS: meal prep, cleaning, laundry, shopping, and community activity  PERSONAL FACTORS: Age, Behavior pattern, Fitness, and 1-2 comorbidities: see Pmhx are also affecting patient's functional outcome.   REHAB POTENTIAL: Good  CLINICAL DECISION MAKING: Stable/uncomplicated  EVALUATION COMPLEXITY: Low   GOALS: Goals reviewed with patient? Yes  SHORT TERM GOALS: Target date: 03/17/24  Pt will tolerate full aquatic sessions consistently without increase in pain and with improving function to demonstrate good toleration and effectiveness of intervention.  Baseline: Goal status: INITIAL  2.  Pt will tolerate walking to and from setting and engaging in aquatic therapy session without excessive fatigue or increase in pain to demonstrate improved toleration to activity.  Baseline:  Goal status: INITIAL  3.  Pt will report compliance of daily walking at least 10 minutes (5 days a week) Baseline:  Goal status: INITIAL  4.   Pt will report improvement with transfers getting out of bed with 50% improvement Baseline:  Goal status: INITIAL    LONG TERM GOALS: Target date: 04/12/24  Pt to improve on ODI by 13% to demonstrate statistically significant Improvement in function. (MCID 13-15%) Baseline: 22/50=44% Goal status: INITIAL  2.  Pt will improve on Berg balance test to >/= 45/56 to demonstrate a decrease in fall risk. (MCD 7) Baseline: 36/56 Goal status: INITIAL  3. Pt will improve strength in hip flex by at least 1 grade to demonstrate improved overall physical function Baseline:  Goal status: INITIAL  4.  Pt will report decrease in pain by at least 50% for improved toleration to activity/quality of life and to demonstrate  improved management of pain. Baseline:  Goal status: INITIAL  5.  Pt will report improvement in activity tolerance/energy level by 75% Baseline:  Goal status: INITIAL  6.  Pt will be indep with final HEP's (land and aquatic as appropriate) for continued management of condition Baseline:  Goal status: INITIAL  PLAN:  PT FREQUENCY: 2x/week  PT DURATION: 8 weeks  PLANNED INTERVENTIONS: [id number]- PT Re-evaluation, 97750- Physical Performance Testing, 97110-Therapeutic exercises, 97530- Therapeutic activity, 97112- Neuromuscular re-education, 97535- Self Care, 02859- Manual therapy, [id number]- Gait training, [phone]- Aquatic Therapy, [phone]- Ionotophoresis 4mg /ml Dexamethasone , 79439 (1-2 muscles), 20561 (3+ muscles)- Dry Needling, Patient/Family education, Balance training, Stair training, Taping, Joint mobilization, DME instructions, Cryotherapy, and Moist heat.  PLAN FOR NEXT SESSION: Aquatics only to begin: le strengthening, core strengthening, balance and proprioception training, pt edu on exercise importance, pain management; HEP  Ronal Foots) Geralene Afshar MPT 02/29/24 12:28 PM [facility] [address] [place], [place], [address] Phone: [phone]   Fax:  [phone]    "

## 2024-03-06 ENCOUNTER — Encounter (HOSPITAL_BASED_OUTPATIENT_CLINIC_OR_DEPARTMENT_OTHER): Payer: Self-pay | Admitting: Physical Therapy

## 2024-03-06 ENCOUNTER — Encounter: Payer: Self-pay | Admitting: Family Medicine

## 2024-03-06 ENCOUNTER — Ambulatory Visit (HOSPITAL_BASED_OUTPATIENT_CLINIC_OR_DEPARTMENT_OTHER): Admitting: Physical Therapy

## 2024-03-06 DIAGNOSIS — S32050D Wedge compression fracture of fifth lumbar vertebra, subsequent encounter for fracture with routine healing: Secondary | ICD-10-CM | POA: Diagnosis not present

## 2024-03-06 DIAGNOSIS — R2689 Other abnormalities of gait and mobility: Secondary | ICD-10-CM

## 2024-03-06 DIAGNOSIS — M6281 Muscle weakness (generalized): Secondary | ICD-10-CM

## 2024-03-06 DIAGNOSIS — M5459 Other low back pain: Secondary | ICD-10-CM

## 2024-03-06 NOTE — Therapy (Addendum)
 " OUTPATIENT PHYSICAL THERAPY THORACOLUMBAR TREATMENT   Patient Name: Stacey Page MRN: [id number] DOB:1950/04/28, 74 y.o., female Today's Date: 03/06/2024  END OF SESSION:  PT End of Session - 03/06/24 1145     Visit Number 4    Date for Recertification  04/12/24    Authorization Type HTA mcr    PT Start Time 1145    PT Stop Time 1225    PT Time Calculation (min) 40 min    Activity Tolerance Patient tolerated treatment well    Behavior During Therapy [facility] for tasks assessed/performed          Past Medical History:  Diagnosis Date   Anxiety    doesn't take any meds   Arthritis of right knee 03/14/2016   Asthma    Back pain    Chronic diastolic CHF (congestive heart failure) (HCC)    HF with Preserved EF (60-65%) - Grade II Diastolic Dysfunction (Hypertensive Heart Disease). takes Furosemide  daily   COPD (chronic obstructive pulmonary disease) (HCC)    Depression    doesn't take meds   Diabetes (HCC)    takes Januvia  daily   Eczema    uses cream as needed   GERD (gastroesophageal reflux disease)    History of bronchitis as a child    HTN (hypertension)    Insomnia    Moderate aortic stenosis by prior echocardiogram 04/2017   10/15/2021: Leanor calcification with moderate AS, MG 23 mmHg   Mycobacterium avium-intracellulare infection (HCC) 2016   OA (osteoarthritis)    Obese    OSA (obstructive sleep apnea) 02/25/2014   wears CPAP at night   Peripheral neuropathy    takes Gabapentin  as needed   Pneumonia    hx of-2010   Seasonal allergies    uses Flonase  daily   Sleep apnea    Past Surgical History:  Procedure Laterality Date   BREAST BIOPSY Right 03/2018   BREAST LUMPECTOMY WITH RADIOACTIVE SEED LOCALIZATION Right 12/28/2018   Procedure: RIGHT BREAST LUMPECTOMY WITH RADIOACTIVE SEED LOCALIZATION;  Surgeon: Curvin Deward MOULD, MD;  Location: [facility] OR;  Service: General;  Laterality: Right;   BREAST SURGERY     CARDIOPULMONARY EXERCISE TEST (CPX)  06/21/2021    (Performed on Coreg  25 mg twice daily): PFTs: FVC 1.76 (60%), FEV1 1.59 (77%), ratio 114% => RESTRICTIVE PHYSIOLOGY; 5 min- 1 mph - 2% grade. 7/10 dyspnea -> pulse ox range during exercise 95 to 98%, low of 90%, 97% on recovery; BP 106/84-160/64; heart rate 64-90 bpm (60% MPHR) -> Chronotropic Incompetence;;MAIN FACTOR for Exercise Limitation = BODY HABITUS w/ Chronotropic Incompetence.   CESAREAN SECTION  x2   COLONOSCOPY     CORONARY CALCIUM  SCORE & CT ANGIOGRAM  09/2017   Coronary Ca Score = 11 (low).  CTA- no obstructive CAD (minimal disease)   JOINT REPLACEMENT     KNEE ARTHROSCOPY Right    LUNG BIOPSY Right 06/10/2014   Procedure: LUNG BIOPSY;  Surgeon: Maude Fleeta Ochoa, MD;  Location: [facility] OR;  Service: Thoracic;  Laterality: Right;   POLYPECTOMY     throat   RIGHT HEART CATH N/A 10/15/2021   Procedure: RIGHT HEART CATH;  Surgeon: Claudene Victory ORN, MD;  Location: [facility] INVASIVE CV LAB;  Service: CV: Mild Pulmonary Hypertension (WHO Group 3).  Mean PAP 28 mmHg, PCWP 13 mmHg.  PVR 2.73 [place].  RAP 6 mmHg.CO-CI 5.5L/min, 2.17 L/min/m.   TOTAL KNEE ARTHROPLASTY Right 03/14/2016   Procedure: RIGHT TOTAL KNEE ARTHROPLASTY;  Surgeon: Oneil JAYSON Herald, MD;  Location: [facility] OR;  Service: Orthopedics;  Laterality: Right;   TRANSTHORACIC ECHOCARDIOGRAM  10/10/2021   EF 70 to 75%.  Hyperdynamic with no RWMA.  Moderate LVH.  Moderate LA dilation indicating likely diastolic dysfunction although not interpretable.  Moderately elevated PAP with normal RV function, dilated IVC with elevated RAP estimated at 15 mmHg..  Moderate MAC with no MR.  AOV calcification with Moderate AS-mean AvG 23 mmHg.   TRANSTHORACIC ECHOCARDIOGRAM  06/14/2021   EF 60 to 65%.  No RWMA.  Moderate cLVH with GR 1 DD.  Mild LA dilation.  Mild to moderate aortic calcification/stenosis.  Mean AVG 18 mmHg.   VIDEO ASSISTED THORACOSCOPY Right 06/10/2014   Procedure: VIDEO ASSISTED THORACOSCOPY;  Surgeon: Maude Fleeta Ochoa, MD;  Location: [facility] OR;   Service: Thoracic;  Laterality: Right;   Patient Active Problem List   Diagnosis Date Noted   Morbid obesity (HCC), starting BMI 55.52 07/10/2023   Right foot pain 07/10/2023   Subacromial bursitis of right shoulder joint 04/18/2023   Asthma 03/03/2023   Urinary frequency 02/03/2023   Viral upper respiratory tract infection 12/20/2022   Dysthymia 02/08/2022   Hypokalemia 01/26/2022   Mild pulmonary hypertension (HCC) 01/09/2022   Need for influenza vaccination 12/02/2021   Dysfunction of left eustachian tube 12/02/2021   Allergic rhinitis 12/02/2021   Chronic respiratory failure with hypoxia (HCC) 11/11/2021   Demand ischemia (HCC)    Respiratory acidosis    Acute respiratory failure with hypercapnia (HCC)    Acute on chronic heart failure with preserved ejection fraction (HCC)    CHF (congestive heart failure) (HCC) 10/09/2021   Reactive airway disease 09/21/2021   Viral syndrome 09/21/2021   Hypertensive retinopathy of right eye, grade 1 08/11/2021   Grade 2 hypertensive retinopathy, left 08/11/2021   Nuclear sclerotic cataract of both eyes 08/11/2021   Need for shingles vaccine 07/14/2021   Iron deficiency anemia 01/13/2021   Stage 4 chronic kidney disease (HCC) 07/09/2019   Type 2 diabetes mellitus with obesity 07/02/2019   Polyneuropathy associated with underlying disease 11/28/2018   Severe episode of recurrent major depressive disorder, without psychotic features (HCC) 11/28/2018   Type 2 diabetes mellitus with diabetic peripheral angiopathy without gangrene, without long-term current use of insulin  (HCC) 11/28/2018   Poor short term memory 11/28/2018   Moderate aortic stenosis by prior echocardiogram 11/06/2018   Diabetes mellitus (HCC) 03/21/2017   Acute renal failure superimposed on stage 2 chronic kidney disease 03/17/2016   Symptomatic anemia 03/17/2016   S/P total knee arthroplasty, right 03/17/2016   Unilateral primary osteoarthritis, right knee     Hyperlipidemia associated with type 2 diabetes mellitus (HCC) 12/15/2015   Pain in both lower extremities 11/19/2015   Numbness in both hands 08/13/2015   Panic attack 06/09/2015   Right knee pain 05/14/2015   Chronic renal insufficiency 09/16/2014   Pneumothorax on right    Atypical mycobacterium infection    ILD (interstitial lung disease) 2/2 MAI s/p med rxn    Class 3 severe obesity with serious comorbidity and body mass index (BMI) of 50.0 to 59.9 in adult (HCC) 04/03/2014   OSA (obstructive sleep apnea) 02/25/2014   Hypertensive heart disease with chronic diastolic congestive heart failure (HCC) 02/25/2014   DOE (dyspnea on exertion) 02/25/2014   Essential hypertension 02/25/2014    PCP: Elsie Sim Lent MD  REFERRING PROVIDER: Burnetta Brunet, DO   REFERRING DIAG:  [phone] (ICD-10-CM) - Chronic low back pain, unspecified back pain laterality, unspecified whether sciatica present  M51.360 (ICD-10-CM) - Degeneration of intervertebral disc of lumbar region with discogenic back pain  S32.050A (ICD-10-CM) - Compression fracture of L5 vertebra, initial encounter (HCC)    Rationale for Evaluation and Treatment: Rehabilitation  THERAPY DIAG:  Other low back pain  Muscle weakness (generalized)  Other abnormalities of gait and mobility  ONSET DATE: few months exacerbation  SUBJECTIVE:                                                                                                                                                                                           SUBJECTIVE STATEMENT: Pt reports that her pain has been worse with the cold weather.   Overall she reports good tolerance for walking to/from pool and for entire aquatic therapy session.    Pt's daughter, Orland, present on deck during session.    Initial subjective: Pt had tightness and pain in her right lower back since a car ride to and from [place] . Have had LBP for the past 5 years.  Had therapy for  it and it helped finished, about 6 months ago. Pt arrives with dtr whom she lives with.  She reports she is sedentary.  Says her pain from recent trip has reduced since dtr has been making her walk. Occasional N&T bottom of right foot due to peripheral neuropathies.  Pain stays in the middle and to the right  PERTINENT HISTORY:  Aquatic and land-based PT for chronic low back pain and lumbar DDD.   - 1. Chronic low back pain, unspecified back pain laterality, unspecified whether sciatica present   2. Degeneration of intervertebral disc of lumbar region with discogenic back pain   3. Compression fracture of L5 vertebra, initial encounter (HCC)   4. Osteopenia of neck of right femur   R TKR  PAIN:  Are you having pain? Yes: NPRS scale: 6-7/10 Pain location: lower back, Lt knee Pain description: ache Aggravating factors: first thing in am, staying in one position Relieving factors: rest  PRECAUTIONS: None  RED FLAGS: None   WEIGHT BEARING RESTRICTIONS: No  FALLS:  Has patient fallen in last 6 months? No  LIVING ENVIRONMENT: Lives with: lives with their family Lives in: House/apartment Has following equipment at home: Single point cane and Environmental Consultant - 4 wheeled  OCCUPATION: retired  PLOF: Independent  PATIENT GOALS: feel better, exercises better, getting out of bed and sitting on couch, endurance  NEXT MD VISIT: as needed  OBJECTIVE:  Note: Objective measures were completed at Evaluation unless otherwise noted.  DIAGNOSTIC FINDINGS:  XR Lumbar spijne 11/25 X-rays demonstrate no significant  scoliosis.  There is severe degenerative disc disease at  the L4-L5 and  L5-S1 level.  There is a mild compression deformity of L5 which is about  10% of the disc space, likely degenerative in nature.  There is endplate  bony sclerosis from her DDD.  There is aortic calcification noted in  addition.   PATIENT SURVEYS:  ODI:22/50=44%  COGNITION: Overall cognitive status: Within  functional limits for tasks assessed     SENSATION: Ocassional N&T plantar aspect right foot  MUSCLE LENGTH: Hamstrings: Right tight tested in sitting   POSTURE: Body habitus creates difficulty to assess well increased lumbar lordosis , left knee valgus  PALPATION: TTP lumbar paraspinals  LUMBAR ROM:   AROM eval  Flexion Full *P  Extension Full P*  Right lateral flexion 50% limited P!   Left lateral flexion 50% limited P*  Right rotation   Left rotation    (Blank rows = not tested)  LOWER EXTREMITY ROM:     wfl  LOWER EXTREMITY MMT:    MMT Right eval Left eval  Hip flexion 3+ 3+  Hip extension    Hip abduction 5 5  Hip adduction 5 5  Hip internal rotation    Hip external rotation    Knee flexion 5 5  Knee extension 5 5  Ankle dorsiflexion    Ankle plantarflexion    Ankle inversion    Ankle eversion     (Blank rows = not tested)  LUMBAR SPECIAL TESTS:  Slump test: Negative  FUNCTIONAL TESTS:  Timed up and go (TUG): 16.16    Item Test date: 02/15/24 Date:  Date:   Sitting to standing 2. able to stand using hands after several tries Insert SmartPhrase OPRCBERGREEVAL Insert SmartPhrase OPRCBERGREEVAL  2. Standing unsupported 4. able to stand safely for 2 minutes    3. Sitting with back unsupported, feet supported 4. able to sit safely and securely for 2 minutes    4. Standing to sitting 3. controls descent by using hands    5. Pivot transfer  3. able to transfer safely with definite need of hands    6. Standing unsupported with eyes closed 4. able to stand 10 seconds safely    7. Standing unsupported with feet together 4. able to place feet together independently and stand 1 minute safely    8. Reaching forward with outstretched arms while standing 3. can reach forward 12 cm (5 inches)    9. Pick up object from the floor from standing 3. able to pick up slipper but needs supervision    10. Turning to look behind over left and right shoulders while standing 4.  looks behind from both sides and weight shifts well    11. Turn 360 degrees 2. able to turn 360 degrees safely but slowly    12. Place alternate foot on step or stool while standing unsupported 0. needs assistance to keep from falling/unable to try    13. Standing unsupported one foot in front 0. loses balance while stepping or standing    14. Standing on one leg 0. unable to try of needs assist to prevent fall      Total Score 36/56 Total Score:    Total Score:     GAIT: Distance walked: 400 ft Assistive device utilized: None Level of assistance: HHA of dtr Comments: off load left, increased lateral displacement  TREATMENT  [facility] Adult PT Treatment:  Date: 03/06/24 Pt seen for aquatic therapy today.  Treatment took place in water  3.5-4.75 ft in depth at the [facility] pool. Temp of water  was 91.  Pt entered/exited the pool via stairs with bil rail.  - unsupported forward and backward walking-> marching in 4 ft - side stepping with horizontal add/abd with rainbow hand floats ->shoulder add/abd - suitcase carry with bil -> unilateral rainbow hand floats under water  at side, marching forward/backward  - bow & Arrow with same side step back x 10 each side.  VC and demonstration provided - UE on wall: hip add/abd 10; toe/heel raises x10; wall push up/off x 10 (difficulty with coordinating movement);  -UE on rainbow hand floats:  tandem gait backward/forward; marching backward/ forward with row motion with arms -alternating toe taps to first step without UE support - knee to chest stretch at stairs with UE on rails  Pt requires the buoyancy and hydrostatic pressure of water  for support, and to offload joints by unweighting joint load by at least 50 % in navel deep water  and by at least 75-80% in chest to neck deep water .  Viscosity of the water  is needed for resistance of strengthening. Water  current perturbations provides challenge to standing  balance requiring increased core activation.                                                                                                                                  PATIENT EDUCATION:  Education details: intro to aquatic therapy Person educated: Patient and Child(ren) Dtr Orland Education method: Explanation, demonstration Education comprehension: verbalized understanding  HOME EXERCISE PROGRAM: Aquatic TBA  ASSESSMENT:  CLINICAL IMPRESSION: Pt requires visual and verbal cues for improved posture and form with exercises.  Pt reported slight reduction of pain with exercises performed today.  Will continue to progress as tolerated.  Pt remains a good candidate for aquatic based PT.   Pt has met STG 1 and 2.      Initial impression:  Patient is a 74 y.o. f who was seen today for physical therapy evaluation and treatment for LBP.  She arrives with dtr whom assists her as well as lives with her.  Pt reports she is very sedentary.  Has had LBP for 5 yrs with exacerbation after long car ride over holidays.  Patient presents with pain limited deficits in  LB with some radiation into right hip in strength, ROM, endurance, activity tolerance, gait, balance, and functional mobility with ADL's. She is having to modify and restrict ADL's as indicated by outcome measure score as well as subjective information and objective measures which is affecting overall participation. In part some of her issues likely stem for chronic inactivity. Patient will benefit from skilled physical therapy in order to improve function and reduce impairment. Plan to begin in aquatics and transition to land as approp.    OBJECTIVE IMPAIRMENTS: Abnormal gait, decreased activity tolerance, decreased balance, decreased endurance,  decreased knowledge of use of DME, decreased mobility, difficulty walking, decreased strength, postural dysfunction, obesity, and pain.   ACTIVITY LIMITATIONS: carrying, lifting, bending,  standing, squatting, stairs, transfers, bed mobility, and locomotion level  PARTICIPATION LIMITATIONS: meal prep, cleaning, laundry, shopping, and community activity  PERSONAL FACTORS: Age, Behavior pattern, Fitness, and 1-2 comorbidities: see Pmhx are also affecting patient's functional outcome.   REHAB POTENTIAL: Good  CLINICAL DECISION MAKING: Stable/uncomplicated  EVALUATION COMPLEXITY: Low   GOALS: Goals reviewed with patient? Yes  SHORT TERM GOALS: Target date: 03/17/24  Pt will tolerate full aquatic sessions consistently without increase in pain and with improving function to demonstrate good toleration and effectiveness of intervention.  Baseline: Goal status: met - 03/06/24  2.  Pt will tolerate walking to and from setting and engaging in aquatic therapy session without excessive fatigue or increase in pain to demonstrate improved toleration to activity.  Baseline:  Goal status: met -03/06/24  3.  Pt will report compliance of daily walking at least 10 minutes (5 days a week) Baseline:  Goal status: INITIAL  4.   Pt will report improvement with transfers getting out of bed with 50% improvement Baseline:  Goal status: INITIAL    LONG TERM GOALS: Target date: 04/12/24  Pt to improve on ODI by 13% to demonstrate statistically significant Improvement in function. (MCID 13-15%) Baseline: 22/50=44% Goal status: INITIAL  2.  Pt will improve on Berg balance test to >/= 45/56 to demonstrate a decrease in fall risk. (MCD 7) Baseline: 36/56 Goal status: INITIAL  3. Pt will improve strength in hip flex by at least 1 grade to demonstrate improved overall physical function Baseline:  Goal status: INITIAL  4.  Pt will report decrease in pain by at least 50% for improved toleration to activity/quality of life and to demonstrate improved management of pain. Baseline:  Goal status: INITIAL  5.  Pt will report improvement in activity tolerance/energy level by 75% Baseline:   Goal status: INITIAL  6.  Pt will be indep with final HEP's (land and aquatic as appropriate) for continued management of condition Baseline:  Goal status: INITIAL  PLAN:  PT FREQUENCY: 2x/week  PT DURATION: 8 weeks  PLANNED INTERVENTIONS: [id number]- PT Re-evaluation, 97750- Physical Performance Testing, 97110-Therapeutic exercises, 97530- Therapeutic activity, 97112- Neuromuscular re-education, 97535- Self Care, [id number]- Manual therapy, [id number]- Gait training, [phone]- Aquatic Therapy, [phone]- Ionotophoresis 4mg /ml Dexamethasone , 79439 (1-2 muscles), 20561 (3+ muscles)- Dry Needling, Patient/Family education, Balance training, Stair training, Taping, Joint mobilization, DME instructions, Cryotherapy, and Moist heat.  PLAN FOR NEXT SESSION: Aquatics only to begin: le strengthening, core strengthening, balance and proprioception training, pt edu on exercise importance, pain management; HEP  Delon Aquas, PTA 03/06/24 12:35 PM [facility] [address] [place], [place], [address] Phone: [phone]   Fax:  [phone]  "

## 2024-03-08 ENCOUNTER — Ambulatory Visit (HOSPITAL_BASED_OUTPATIENT_CLINIC_OR_DEPARTMENT_OTHER): Payer: Self-pay | Admitting: Physical Therapy

## 2024-03-08 ENCOUNTER — Encounter (HOSPITAL_BASED_OUTPATIENT_CLINIC_OR_DEPARTMENT_OTHER): Payer: Self-pay | Admitting: Physical Therapy

## 2024-03-08 DIAGNOSIS — M5459 Other low back pain: Secondary | ICD-10-CM

## 2024-03-08 DIAGNOSIS — S32050D Wedge compression fracture of fifth lumbar vertebra, subsequent encounter for fracture with routine healing: Secondary | ICD-10-CM | POA: Diagnosis not present

## 2024-03-08 DIAGNOSIS — R2689 Other abnormalities of gait and mobility: Secondary | ICD-10-CM

## 2024-03-08 DIAGNOSIS — M6281 Muscle weakness (generalized): Secondary | ICD-10-CM

## 2024-03-08 NOTE — Therapy (Signed)
 " OUTPATIENT PHYSICAL THERAPY THORACOLUMBAR TREATMENT   Patient Name: Stacey Page MRN: [id number] DOB:01-26-1951, 74 y.o., female Today's Date: 03/08/2024  END OF SESSION:  PT End of Session - 03/08/24 1057     Visit Number 5    Date for Recertification  04/12/24    Authorization Type HTA mcr    PT Start Time 1058    PT Stop Time 1137    PT Time Calculation (min) 39 min    Behavior During Therapy [facility] for tasks assessed/performed          Past Medical History:  Diagnosis Date   Anxiety    doesn't take any meds   Arthritis of right knee 03/14/2016   Asthma    Back pain    Chronic diastolic CHF (congestive heart failure) (HCC)    HF with Preserved EF (60-65%) - Grade II Diastolic Dysfunction (Hypertensive Heart Disease). takes Furosemide  daily   COPD (chronic obstructive pulmonary disease) (HCC)    Depression    doesn't take meds   Diabetes (HCC)    takes Januvia  daily   Eczema    uses cream as needed   GERD (gastroesophageal reflux disease)    History of bronchitis as a child    HTN (hypertension)    Insomnia    Moderate aortic stenosis by prior echocardiogram 04/2017   10/15/2021: Leanor calcification with moderate AS, MG 23 mmHg   Mycobacterium avium-intracellulare infection (HCC) 2016   OA (osteoarthritis)    Obese    OSA (obstructive sleep apnea) 02/25/2014   wears CPAP at night   Peripheral neuropathy    takes Gabapentin  as needed   Pneumonia    hx of-2010   Seasonal allergies    uses Flonase  daily   Sleep apnea    Past Surgical History:  Procedure Laterality Date   BREAST BIOPSY Right 03/2018   BREAST LUMPECTOMY WITH RADIOACTIVE SEED LOCALIZATION Right 12/28/2018   Procedure: RIGHT BREAST LUMPECTOMY WITH RADIOACTIVE SEED LOCALIZATION;  Surgeon: Curvin Deward MOULD, MD;  Location: [facility] OR;  Service: General;  Laterality: Right;   BREAST SURGERY     CARDIOPULMONARY EXERCISE TEST (CPX)  06/21/2021   (Performed on Coreg  25 mg twice daily): PFTs: FVC 1.76  (60%), FEV1 1.59 (77%), ratio 114% => RESTRICTIVE PHYSIOLOGY; 5 min- 1 mph - 2% grade. 7/10 dyspnea -> pulse ox range during exercise 95 to 98%, low of 90%, 97% on recovery; BP 106/84-160/64; heart rate 64-90 bpm (60% MPHR) -> Chronotropic Incompetence;;MAIN FACTOR for Exercise Limitation = BODY HABITUS w/ Chronotropic Incompetence.   CESAREAN SECTION  x2   COLONOSCOPY     CORONARY CALCIUM  SCORE & CT ANGIOGRAM  09/2017   Coronary Ca Score = 11 (low).  CTA- no obstructive CAD (minimal disease)   JOINT REPLACEMENT     KNEE ARTHROSCOPY Right    LUNG BIOPSY Right 06/10/2014   Procedure: LUNG BIOPSY;  Surgeon: Maude Fleeta Ochoa, MD;  Location[facility] OR;  Service: Thoracic;  Laterality: Right;   POLYPECTOMY     throat   RIGHT HEART CATH N/A 10/15/2021   Procedure: RIGHT HEART CATH;  Surgeon: Claudene Victory ORN, MD;  Location: [facility] INVASIVE CV LAB;  Service: CV: Mild Pulmonary Hypertension (WHO Group 3).  Mean PAP 28 mmHg, PCWP 13 mmHg.  PVR 2.73 [place].  RAP 6 mmHg.CO-CI 5.5L/min, 2.17 L/min/m.   TOTAL KNEE ARTHROPLASTY Right 03/14/2016   Procedure: RIGHT TOTAL KNEE ARTHROPLASTY;  Surgeon: Oneil JAYSON Herald, MD;  Location: [facility] OR;  Service: Orthopedics;  Laterality:  Right;   TRANSTHORACIC ECHOCARDIOGRAM  10/10/2021   EF 70 to 75%.  Hyperdynamic with no RWMA.  Moderate LVH.  Moderate LA dilation indicating likely diastolic dysfunction although not interpretable.  Moderately elevated PAP with normal RV function, dilated IVC with elevated RAP estimated at 15 mmHg..  Moderate MAC with no MR.  AOV calcification with Moderate AS-mean AvG 23 mmHg.   TRANSTHORACIC ECHOCARDIOGRAM  06/14/2021   EF 60 to 65%.  No RWMA.  Moderate cLVH with GR 1 DD.  Mild LA dilation.  Mild to moderate aortic calcification/stenosis.  Mean AVG 18 mmHg.   VIDEO ASSISTED THORACOSCOPY Right 06/10/2014   Procedure: VIDEO ASSISTED THORACOSCOPY;  Surgeon: Maude Fleeta Ochoa, MD;  Location: [facility] OR;  Service: Thoracic;  Laterality: Right;   Patient Active  Problem List   Diagnosis Date Noted   Morbid obesity (HCC), starting BMI 55.52 07/10/2023   Right foot pain 07/10/2023   Subacromial bursitis of right shoulder joint 04/18/2023   Asthma 03/03/2023   Urinary frequency 02/03/2023   Viral upper respiratory tract infection 12/20/2022   Dysthymia 02/08/2022   Hypokalemia 01/26/2022   Mild pulmonary hypertension (HCC) 01/09/2022   Need for influenza vaccination 12/02/2021   Dysfunction of left eustachian tube 12/02/2021   Allergic rhinitis 12/02/2021   Chronic respiratory failure with hypoxia (HCC) 11/11/2021   Demand ischemia (HCC)    Respiratory acidosis    Acute respiratory failure with hypercapnia (HCC)    Acute on chronic heart failure with preserved ejection fraction (HCC)    CHF (congestive heart failure) (HCC) 10/09/2021   Reactive airway disease 09/21/2021   Viral syndrome 09/21/2021   Hypertensive retinopathy of right eye, grade 1 08/11/2021   Grade 2 hypertensive retinopathy, left 08/11/2021   Nuclear sclerotic cataract of both eyes 08/11/2021   Need for shingles vaccine 07/14/2021   Iron deficiency anemia 01/13/2021   Stage 4 chronic kidney disease (HCC) 07/09/2019   Type 2 diabetes mellitus with obesity 07/02/2019   Polyneuropathy associated with underlying disease 11/28/2018   Severe episode of recurrent major depressive disorder, without psychotic features (HCC) 11/28/2018   Type 2 diabetes mellitus with diabetic peripheral angiopathy without gangrene, without long-term current use of insulin  (HCC) 11/28/2018   Poor short term memory 11/28/2018   Moderate aortic stenosis by prior echocardiogram 11/06/2018   Diabetes mellitus (HCC) 03/21/2017   Acute renal failure superimposed on stage 2 chronic kidney disease 03/17/2016   Symptomatic anemia 03/17/2016   S/P total knee arthroplasty, right 03/17/2016   Unilateral primary osteoarthritis, right knee    Hyperlipidemia associated with type 2 diabetes mellitus (HCC)  12/15/2015   Pain in both lower extremities 11/19/2015   Numbness in both hands 08/13/2015   Panic attack 06/09/2015   Right knee pain 05/14/2015   Chronic renal insufficiency 09/16/2014   Pneumothorax on right    Atypical mycobacterium infection    ILD (interstitial lung disease) 2/2 MAI s/p med rxn    Class 3 severe obesity with serious comorbidity and body mass index (BMI) of 50.0 to 59.9 in adult (HCC) 04/03/2014   OSA (obstructive sleep apnea) 02/25/2014   Hypertensive heart disease with chronic diastolic congestive heart failure (HCC) 02/25/2014   DOE (dyspnea on exertion) 02/25/2014   Essential hypertension 02/25/2014    PCP: Elsie Sim Lent MD  REFERRING PROVIDER: Burnetta Brunet, DO   REFERRING DIAG:  [phone] (ICD-10-CM) - Chronic low back pain, unspecified back pain laterality, unspecified whether sciatica present  M51.360 (ICD-10-CM) - Degeneration of intervertebral disc of  lumbar region with discogenic back pain  S32.050A (ICD-10-CM) - Compression fracture of L5 vertebra, initial encounter (HCC)    Rationale for Evaluation and Treatment: Rehabilitation  THERAPY DIAG:  Other low back pain  Muscle weakness (generalized)  Other abnormalities of gait and mobility  ONSET DATE: few months exacerbation  SUBJECTIVE:                                                                                                                                                                                           SUBJECTIVE STATEMENT: Pt reports that she had some soreness after last session. Pain remains high, but the water  feels good. I like coming here.     Pt's daughter, Orland, present on deck during session.    Initial subjective: Pt had tightness and pain in her right lower back since a car ride to and from [place] . Have had LBP for the past 5 years.  Had therapy for it and it helped finished, about 6 months ago. Pt arrives with dtr whom she lives with.  She reports  she is sedentary.  Says her pain from recent trip has reduced since dtr has been making her walk. Occasional N&T bottom of right foot due to peripheral neuropathies.  Pain stays in the middle and to the right  PERTINENT HISTORY:  Aquatic and land-based PT for chronic low back pain and lumbar DDD.   - 1. Chronic low back pain, unspecified back pain laterality, unspecified whether sciatica present   2. Degeneration of intervertebral disc of lumbar region with discogenic back pain   3. Compression fracture of L5 vertebra, initial encounter (HCC)   4. Osteopenia of neck of right femur   R TKR  PAIN:  Are you having pain? Yes: NPRS scale: 8/10 Pain location: lower back, Lt knee Pain description: ache Aggravating factors: first thing in am, staying in one position Relieving factors: rest  PRECAUTIONS: None  RED FLAGS: None   WEIGHT BEARING RESTRICTIONS: No  FALLS:  Has patient fallen in last 6 months? No  LIVING ENVIRONMENT: Lives with: lives with their family Lives in: House/apartment Has following equipment at home: Single point cane and Environmental Consultant - 4 wheeled  OCCUPATION: retired  PLOF: Independent  PATIENT GOALS: feel better, exercises better, getting out of bed and sitting on couch, endurance  NEXT MD VISIT: as needed  OBJECTIVE:  Note: Objective measures were completed at Evaluation unless otherwise noted.  DIAGNOSTIC FINDINGS:  XR Lumbar spijne 11/25 X-rays demonstrate no significant  scoliosis.  There is severe degenerative disc disease at the L4-L5 and  L5-S1 level.  There is a mild compression deformity of  L5 which is about  10% of the disc space, likely degenerative in nature.  There is endplate  bony sclerosis from her DDD.  There is aortic calcification noted in  addition.   PATIENT SURVEYS:  ODI:22/50=44%  COGNITION: Overall cognitive status: Within functional limits for tasks assessed     SENSATION: Ocassional N&T plantar aspect right foot  MUSCLE  LENGTH: Hamstrings: Right tight tested in sitting   POSTURE: Body habitus creates difficulty to assess well increased lumbar lordosis , left knee valgus  PALPATION: TTP lumbar paraspinals  LUMBAR ROM:   AROM eval  Flexion Full *P  Extension Full P*  Right lateral flexion 50% limited P!   Left lateral flexion 50% limited P*  Right rotation   Left rotation    (Blank rows = not tested)  LOWER EXTREMITY ROM:     wfl  LOWER EXTREMITY MMT:    MMT Right eval Left eval  Hip flexion 3+ 3+  Hip extension    Hip abduction 5 5  Hip adduction 5 5  Hip internal rotation    Hip external rotation    Knee flexion 5 5  Knee extension 5 5  Ankle dorsiflexion    Ankle plantarflexion    Ankle inversion    Ankle eversion     (Blank rows = not tested)  LUMBAR SPECIAL TESTS:  Slump test: Negative  FUNCTIONAL TESTS:  Timed up and go (TUG): 16.16    Item Test date: 02/15/24 Date:  Date:   Sitting to standing 2. able to stand using hands after several tries Insert SmartPhrase OPRCBERGREEVAL Insert SmartPhrase OPRCBERGREEVAL  2. Standing unsupported 4. able to stand safely for 2 minutes    3. Sitting with back unsupported, feet supported 4. able to sit safely and securely for 2 minutes    4. Standing to sitting 3. controls descent by using hands    5. Pivot transfer  3. able to transfer safely with definite need of hands    6. Standing unsupported with eyes closed 4. able to stand 10 seconds safely    7. Standing unsupported with feet together 4. able to place feet together independently and stand 1 minute safely    8. Reaching forward with outstretched arms while standing 3. can reach forward 12 cm (5 inches)    9. Pick up object from the floor from standing 3. able to pick up slipper but needs supervision    10. Turning to look behind over left and right shoulders while standing 4. looks behind from both sides and weight shifts well    11. Turn 360 degrees 2. able to turn 360 degrees  safely but slowly    12. Place alternate foot on step or stool while standing unsupported 0. needs assistance to keep from falling/unable to try    13. Standing unsupported one foot in front 0. loses balance while stepping or standing    14. Standing on one leg 0. unable to try of needs assist to prevent fall      Total Score 36/56 Total Score:    Total Score:     GAIT: Distance walked: 400 ft Assistive device utilized: None Level of assistance: HHA of dtr Comments: off load left, increased lateral displacement  TREATMENT  [facility] Adult PT Treatment:  Date: 03/08/24 Pt seen for aquatic therapy today.  Treatment took place in water  3.5-4.75 ft in depth at the [facility] pool. Temp of water  was 91.  Pt entered/exited the pool via stairs with bil rail.  - unsupported forward and backward walking in 4 ft - side stepping with horizontal add/abd with rainbow hand floats ->shoulder add/abd with short hollow noodles - marching in place with reciprocal row with short hollow noodles - UE On rainbow hand floats: forward walking kicks; SLS x 15sec each LE x 2 - Open book next to wall x 5 each side, heavy cues for form - return to walking backwards/ forwards - seated on bench in water : alternating LAQ and DF; hip abdct/ add; cycling; single short hollow noodle pull down to knees with TrA set  Pt requires the buoyancy and hydrostatic pressure of water  for support, and to offload joints by unweighting joint load by at least 50 % in navel deep water  and by at least 75-80% in chest to neck deep water .  Viscosity of the water  is needed for resistance of strengthening. Water  current perturbations provides challenge to standing balance requiring increased core activation.                                                                                                                                  PATIENT EDUCATION:  Education details: intro to aquatic  therapy Person educated: Patient and Child(ren) Dtr Orland Education method: Explanation, demonstration Education comprehension: verbalized understanding  HOME EXERCISE PROGRAM: Aquatic TBA  ASSESSMENT:  CLINICAL IMPRESSION: Pt requires visual and verbal cues for improved posture and form with exercises. Pt needs increased time for motor planning.  Pt reported slight reduction of pain with exercises performed today.  Will continue to progress as tolerated.  Will plan to work on STS and more balance exercises next session in water .       Initial impression:  Patient is a 74 y.o. f who was seen today for physical therapy evaluation and treatment for LBP.  She arrives with dtr whom assists her as well as lives with her.  Pt reports she is very sedentary.  Has had LBP for 5 yrs with exacerbation after long car ride over holidays.  Patient presents with pain limited deficits in  LB with some radiation into right hip in strength, ROM, endurance, activity tolerance, gait, balance, and functional mobility with ADL's. She is having to modify and restrict ADL's as indicated by outcome measure score as well as subjective information and objective measures which is affecting overall participation. In part some of her issues likely stem for chronic inactivity. Patient will benefit from skilled physical therapy in order to improve function and reduce impairment. Plan to begin in aquatics and transition to land as approp.    OBJECTIVE IMPAIRMENTS: Abnormal gait, decreased activity tolerance, decreased balance, decreased endurance, decreased knowledge of use of DME, decreased mobility, difficulty walking, decreased strength, postural  dysfunction, obesity, and pain.   ACTIVITY LIMITATIONS: carrying, lifting, bending, standing, squatting, stairs, transfers, bed mobility, and locomotion level  PARTICIPATION LIMITATIONS: meal prep, cleaning, laundry, shopping, and community activity  PERSONAL FACTORS: Age,  Behavior pattern, Fitness, and 1-2 comorbidities: see Pmhx are also affecting patient's functional outcome.   REHAB POTENTIAL: Good  CLINICAL DECISION MAKING: Stable/uncomplicated  EVALUATION COMPLEXITY: Low   GOALS: Goals reviewed with patient? Yes  SHORT TERM GOALS: Target date: 03/17/24  Pt will tolerate full aquatic sessions consistently without increase in pain and with improving function to demonstrate good toleration and effectiveness of intervention.  Baseline: Goal status: met - 03/06/24  2.  Pt will tolerate walking to and from setting and engaging in aquatic therapy session without excessive fatigue or increase in pain to demonstrate improved toleration to activity.  Baseline:  Goal status: met -03/06/24  3.  Pt will report compliance of daily walking at least 10 minutes (5 days a week) Baseline:  Goal status: INITIAL  4.   Pt will report improvement with transfers getting out of bed with 50% improvement Baseline:  Goal status: INITIAL    LONG TERM GOALS: Target date: 04/12/24  Pt to improve on ODI by 13% to demonstrate statistically significant Improvement in function. (MCID 13-15%) Baseline: 22/50=44% Goal status: INITIAL  2.  Pt will improve on Berg balance test to >/= 45/56 to demonstrate a decrease in fall risk. (MCD 7) Baseline: 36/56 Goal status: INITIAL  3. Pt will improve strength in hip flex by at least 1 grade to demonstrate improved overall physical function Baseline:  Goal status: INITIAL  4.  Pt will report decrease in pain by at least 50% for improved toleration to activity/quality of life and to demonstrate improved management of pain. Baseline:  Goal status: INITIAL  5.  Pt will report improvement in activity tolerance/energy level by 75% Baseline:  Goal status: INITIAL  6.  Pt will be indep with final HEP's (land and aquatic as appropriate) for continued management of condition Baseline:  Goal status: INITIAL  PLAN:  PT FREQUENCY:  2x/week  PT DURATION: 8 weeks  PLANNED INTERVENTIONS: [id number]- PT Re-evaluation, 97750- Physical Performance Testing, 97110-Therapeutic exercises, 97530- Therapeutic activity, 97112- Neuromuscular re-education, 97535- Self Care, [id number]- Manual therapy, [id number]- Gait training, [phone]- Aquatic Therapy, [phone]- Ionotophoresis 4mg /ml Dexamethasone , 79439 (1-2 muscles), 20561 (3+ muscles)- Dry Needling, Patient/Family education, Balance training, Stair training, Taping, Joint mobilization, DME instructions, Cryotherapy, and Moist heat.  PLAN FOR NEXT SESSION: Aquatics only to begin: le strengthening, core strengthening, balance and proprioception training, pt edu on exercise importance, pain management; HEP  Delon Aquas, PTA 03/08/24 11:39 AM [facility] [address] [place], [place], [address] Phone: [phone]   Fax:  [phone]   "

## 2024-03-12 ENCOUNTER — Ambulatory Visit (HOSPITAL_BASED_OUTPATIENT_CLINIC_OR_DEPARTMENT_OTHER): Payer: Self-pay | Admitting: Physical Therapy

## 2024-03-12 ENCOUNTER — Encounter (HOSPITAL_BASED_OUTPATIENT_CLINIC_OR_DEPARTMENT_OTHER): Payer: Self-pay | Admitting: Physical Therapy

## 2024-03-12 DIAGNOSIS — R2689 Other abnormalities of gait and mobility: Secondary | ICD-10-CM

## 2024-03-12 DIAGNOSIS — M5459 Other low back pain: Secondary | ICD-10-CM

## 2024-03-12 DIAGNOSIS — M6281 Muscle weakness (generalized): Secondary | ICD-10-CM

## 2024-03-14 ENCOUNTER — Encounter (HOSPITAL_BASED_OUTPATIENT_CLINIC_OR_DEPARTMENT_OTHER): Payer: Self-pay | Admitting: Physical Therapy

## 2024-03-14 ENCOUNTER — Ambulatory Visit (HOSPITAL_BASED_OUTPATIENT_CLINIC_OR_DEPARTMENT_OTHER): Admitting: Physical Therapy

## 2024-03-14 DIAGNOSIS — M6281 Muscle weakness (generalized): Secondary | ICD-10-CM

## 2024-03-14 DIAGNOSIS — R2689 Other abnormalities of gait and mobility: Secondary | ICD-10-CM

## 2024-03-14 DIAGNOSIS — M5459 Other low back pain: Secondary | ICD-10-CM

## 2024-03-14 NOTE — Therapy (Signed)
 " OUTPATIENT PHYSICAL THERAPY THORACOLUMBAR TREATMENT   Patient Name: Stacey Page MRN: [id number] DOB:[birth year], 74 y.o., female Today's Date: 03/14/2024  END OF SESSION:  PT End of Session - 03/14/24 1200     Visit Number 7    Date for Recertification  04/12/24    Authorization Type HTA mcr    Progress Note Due on Visit 10    PT Start Time 1148    PT Stop Time 1228    PT Time Calculation (min) 40 min    Activity Tolerance Patient tolerated treatment well    Behavior During Therapy [facility] for tasks assessed/performed          Past Medical History:  Diagnosis Date   Anxiety    doesn't take any meds   Arthritis of right knee 03/14/2016   Asthma    Back pain    Chronic diastolic CHF (congestive heart failure) (HCC)    HF with Preserved EF (60-65%) - Grade II Diastolic Dysfunction (Hypertensive Heart Disease). takes Furosemide  daily   COPD (chronic obstructive pulmonary disease) (HCC)    Depression    doesn't take meds   Diabetes (HCC)    takes Januvia  daily   Eczema    uses cream as needed   GERD (gastroesophageal reflux disease)    History of bronchitis as a child    HTN (hypertension)    Insomnia    Moderate aortic stenosis by prior echocardiogram 04/2017   10/15/2021: Leanor calcification with moderate AS, MG 23 mmHg   Mycobacterium avium-intracellulare infection (HCC) 2016   OA (osteoarthritis)    Obese    OSA (obstructive sleep apnea) 02/25/2014   wears CPAP at night   Peripheral neuropathy    takes Gabapentin  as needed   Pneumonia    hx of-2010   Seasonal allergies    uses Flonase  daily   Sleep apnea    Past Surgical History:  Procedure Laterality Date   BREAST BIOPSY Right 03/2018   BREAST LUMPECTOMY WITH RADIOACTIVE SEED LOCALIZATION Right 12/28/2018   Procedure: RIGHT BREAST LUMPECTOMY WITH RADIOACTIVE SEED LOCALIZATION;  Surgeon: Curvin Deward MOULD, MD;  Location: [facility] OR;  Service: General;  Laterality: Right;   BREAST SURGERY     CARDIOPULMONARY  EXERCISE TEST (CPX)  06/21/2021   (Performed on Coreg  25 mg twice daily): PFTs: FVC 1.76 (60%), FEV1 1.59 (77%), ratio 114% => RESTRICTIVE PHYSIOLOGY; 5 min- 1 mph - 2% grade. 7/10 dyspnea -> pulse ox range during exercise 95 to 98%, low of 90%, 97% on recovery; BP 106/84-160/64; heart rate 64-90 bpm (60% MPHR) -> Chronotropic Incompetence;;MAIN FACTOR for Exercise Limitation = BODY HABITUS w/ Chronotropic Incompetence.   CESAREAN SECTION  x2   COLONOSCOPY     CORONARY CALCIUM  SCORE & CT ANGIOGRAM  09/2017   Coronary Ca Score = 11 (low).  CTA- no obstructive CAD (minimal disease)   JOINT REPLACEMENT     KNEE ARTHROSCOPY Right    LUNG BIOPSY Right 06/10/2014   Procedure: LUNG BIOPSY;  Surgeon: Maude Fleeta Ochoa, MD;  Location: [facility] OR;  Service: Thoracic;  Laterality: Right;   POLYPECTOMY     throat   RIGHT HEART CATH N/A 10/15/2021   Procedure: RIGHT HEART CATH;  Surgeon: Claudene Victory ORN, MD;  Location: [facility] INVASIVE CV LAB;  Service: CV: Mild Pulmonary Hypertension (WHO Group 3).  Mean PAP 28 mmHg, PCWP 13 mmHg.  PVR 2.73 [place].  RAP 6 mmHg.CO-CI 5.5L/min, 2.17 L/min/m.   TOTAL KNEE ARTHROPLASTY Right 03/14/2016   Procedure: RIGHT  TOTAL KNEE ARTHROPLASTY;  Surgeon: Oneil JAYSON Herald, MD;  Location: [facility] OR;  Service: Orthopedics;  Laterality: Right;   TRANSTHORACIC ECHOCARDIOGRAM  10/10/2021   EF 70 to 75%.  Hyperdynamic with no RWMA.  Moderate LVH.  Moderate LA dilation indicating likely diastolic dysfunction although not interpretable.  Moderately elevated PAP with normal RV function, dilated IVC with elevated RAP estimated at 15 mmHg..  Moderate MAC with no MR.  AOV calcification with Moderate AS-mean AvG 23 mmHg.   TRANSTHORACIC ECHOCARDIOGRAM  06/14/2021   EF 60 to 65%.  No RWMA.  Moderate cLVH with GR 1 DD.  Mild LA dilation.  Mild to moderate aortic calcification/stenosis.  Mean AVG 18 mmHg.   VIDEO ASSISTED THORACOSCOPY Right 06/10/2014   Procedure: VIDEO ASSISTED THORACOSCOPY;  Surgeon: Maude Fleeta Ochoa, MD;  Location: [facility] OR;  Service: Thoracic;  Laterality: Right;   Patient Active Problem List   Diagnosis Date Noted   Morbid obesity (HCC), starting BMI 55.52 07/10/2023   Right foot pain 07/10/2023   Subacromial bursitis of right shoulder joint 04/18/2023   Asthma 03/03/2023   Urinary frequency 02/03/2023   Viral upper respiratory tract infection 12/20/2022   Dysthymia 02/08/2022   Hypokalemia 01/26/2022   Mild pulmonary hypertension (HCC) 01/09/2022   Need for influenza vaccination 12/02/2021   Dysfunction of left eustachian tube 12/02/2021   Allergic rhinitis 12/02/2021   Chronic respiratory failure with hypoxia (HCC) 11/11/2021   Demand ischemia (HCC)    Respiratory acidosis    Acute respiratory failure with hypercapnia (HCC)    Acute on chronic heart failure with preserved ejection fraction (HCC)    CHF (congestive heart failure) (HCC) 10/09/2021   Reactive airway disease 09/21/2021   Viral syndrome 09/21/2021   Hypertensive retinopathy of right eye, grade 1 08/11/2021   Grade 2 hypertensive retinopathy, left 08/11/2021   Nuclear sclerotic cataract of both eyes 08/11/2021   Need for shingles vaccine 07/14/2021   Iron deficiency anemia 01/13/2021   Stage 4 chronic kidney disease (HCC) 07/09/2019   Type 2 diabetes mellitus with obesity 07/02/2019   Polyneuropathy associated with underlying disease 11/28/2018   Severe episode of recurrent major depressive disorder, without psychotic features (HCC) 11/28/2018   Type 2 diabetes mellitus with diabetic peripheral angiopathy without gangrene, without long-term current use of insulin  (HCC) 11/28/2018   Poor short term memory 11/28/2018   Moderate aortic stenosis by prior echocardiogram 11/06/2018   Diabetes mellitus (HCC) 03/21/2017   Acute renal failure superimposed on stage 2 chronic kidney disease 03/17/2016   Symptomatic anemia 03/17/2016   S/P total knee arthroplasty, right 03/17/2016   Unilateral primary  osteoarthritis, right knee    Hyperlipidemia associated with type 2 diabetes mellitus (HCC) 12/15/2015   Pain in both lower extremities 11/19/2015   Numbness in both hands 08/13/2015   Panic attack 06/09/2015   Right knee pain 05/14/2015   Chronic renal insufficiency 09/16/2014   Pneumothorax on right    Atypical mycobacterium infection    ILD (interstitial lung disease) 2/2 MAI s/p med rxn    Class 3 severe obesity with serious comorbidity and body mass index (BMI) of 50.0 to 59.9 in adult (HCC) 04/03/2014   OSA (obstructive sleep apnea) 02/25/2014   Hypertensive heart disease with chronic diastolic congestive heart failure (HCC) 02/25/2014   DOE (dyspnea on exertion) 02/25/2014   Essential hypertension 02/25/2014    PCP: Elsie Sim Lent MD  REFERRING PROVIDER: Burnetta Brunet, DO   REFERRING DIAG:  [phone] (ICD-10-CM) - Chronic low back  pain, unspecified back pain laterality, unspecified whether sciatica present  M51.360 (ICD-10-CM) - Degeneration of intervertebral disc of lumbar region with discogenic back pain  S32.050A (ICD-10-CM) - Compression fracture of L5 vertebra, initial encounter (HCC)    Rationale for Evaluation and Treatment: Rehabilitation  THERAPY DIAG:  Other low back pain  Muscle weakness (generalized)  Other abnormalities of gait and mobility  ONSET DATE: few months exacerbation  SUBJECTIVE:                                                                                                                                                                                           SUBJECTIVE STATEMENT:  I'm a 7.5 (out of 10). I feel better for the rest of the day after the therapy but in the morning it is right back    Initial subjective: Pt had tightness and pain in her right lower back since a car ride to and from [place] . Have had LBP for the past 5 years.  Had therapy for it and it helped finished, about 6 months ago. Pt arrives with dtr whom  she lives with.  She reports she is sedentary.  Says her pain from recent trip has reduced since dtr has been making her walk. Occasional N&T bottom of right foot due to peripheral neuropathies.  Pain stays in the middle and to the right  PERTINENT HISTORY:  Aquatic and land-based PT for chronic low back pain and lumbar DDD.   - 1. Chronic low back pain, unspecified back pain laterality, unspecified whether sciatica present   2. Degeneration of intervertebral disc of lumbar region with discogenic back pain   3. Compression fracture of L5 vertebra, initial encounter (HCC)   4. Osteopenia of neck of right femur   R TKR  PAIN:  Are you having pain? Yes: NPRS scale: 7.5/10 Pain location: lower back,  knees, Rt shoulder Pain description: ache Aggravating factors: first thing in am, staying in one position Relieving factors: rest  PRECAUTIONS: None  RED FLAGS: None   WEIGHT BEARING RESTRICTIONS: No  FALLS:  Has patient fallen in last 6 months? No  LIVING ENVIRONMENT: Lives with: lives with their family Lives in: House/apartment Has following equipment at home: Single point cane and Environmental Consultant - 4 wheeled  OCCUPATION: retired  PLOF: Independent  PATIENT GOALS: feel better, exercises better, getting out of bed and sitting on couch, endurance  NEXT MD VISIT: as needed  OBJECTIVE:  Note: Objective measures were completed at Evaluation unless otherwise noted.  DIAGNOSTIC FINDINGS:  XR Lumbar spijne 11/25 X-rays demonstrate no significant  scoliosis.  There is severe degenerative disc disease at the  L4-L5 and  L5-S1 level.  There is a mild compression deformity of L5 which is about  10% of the disc space, likely degenerative in nature.  There is endplate  bony sclerosis from her DDD.  There is aortic calcification noted in  addition.   PATIENT SURVEYS:  ODI:22/50=44%  COGNITION: Overall cognitive status: Within functional limits for tasks  assessed     SENSATION: Ocassional N&T plantar aspect right foot  MUSCLE LENGTH: Hamstrings: Right tight tested in sitting   POSTURE: Body habitus creates difficulty to assess well increased lumbar lordosis , left knee valgus  PALPATION: TTP lumbar paraspinals  LUMBAR ROM:   AROM eval  Flexion Full *P  Extension Full P*  Right lateral flexion 50% limited P!   Left lateral flexion 50% limited P*  Right rotation   Left rotation    (Blank rows = not tested)  LOWER EXTREMITY ROM:     wfl  LOWER EXTREMITY MMT:    MMT Right eval Left eval  Hip flexion 3+ 3+  Hip extension    Hip abduction 5 5  Hip adduction 5 5  Hip internal rotation    Hip external rotation    Knee flexion 5 5  Knee extension 5 5  Ankle dorsiflexion    Ankle plantarflexion    Ankle inversion    Ankle eversion     (Blank rows = not tested)  LUMBAR SPECIAL TESTS:  Slump test: Negative  FUNCTIONAL TESTS:  Timed up and go (TUG): 16.16    Item Test date: 02/15/24 Date:  Date:   Sitting to standing 2. able to stand using hands after several tries Insert SmartPhrase OPRCBERGREEVAL Insert SmartPhrase OPRCBERGREEVAL  2. Standing unsupported 4. able to stand safely for 2 minutes    3. Sitting with back unsupported, feet supported 4. able to sit safely and securely for 2 minutes    4. Standing to sitting 3. controls descent by using hands    5. Pivot transfer  3. able to transfer safely with definite need of hands    6. Standing unsupported with eyes closed 4. able to stand 10 seconds safely    7. Standing unsupported with feet together 4. able to place feet together independently and stand 1 minute safely    8. Reaching forward with outstretched arms while standing 3. can reach forward 12 cm (5 inches)    9. Pick up object from the floor from standing 3. able to pick up slipper but needs supervision    10. Turning to look behind over left and right shoulders while standing 4. looks behind from both  sides and weight shifts well    11. Turn 360 degrees 2. able to turn 360 degrees safely but slowly    12. Place alternate foot on step or stool while standing unsupported 0. needs assistance to keep from falling/unable to try    13. Standing unsupported one foot in front 0. loses balance while stepping or standing    14. Standing on one leg 0. unable to try of needs assist to prevent fall      Total Score 36/56 Total Score:    Total Score:     GAIT: Distance walked: 400 ft Assistive device utilized: None Level of assistance: HHA of dtr Comments: off load left, increased lateral displacement  TREATMENT  [facility] Adult PT Treatment:  Date: 03/12/24 Pt seen for aquatic therapy today.  Treatment took place in water  3.5-4.75 ft in depth at the [facility] pool. Temp of water  was 91.  Pt entered/exited the pool via stairs with bil rail.  - unsupported forward and backward walking in 4 ft - forward march with kick (good balance challenge) - Side stepping -> with horizontal add/abd and shoulder add/abd with RBHB - UE on wall: leg swings into hip flexion/extension x 10; heel/toe raises x 10; wall push up and off x 10, relaxed squats Rest period - full noodle pull down wide stance x 10, staggered stances x 10 Rest period - tandem stance ue support yellow HB leading R then L x 15s (good challenge) 4.0 ft - SLS beginning with hands on walls then lifting off (for added confidence) x 15 s 4.0 ft - stair tapping alternating on bottom step x 10    Pt requires the buoyancy and hydrostatic pressure of water  for support, and to offload joints by unweighting joint load by at least 50 % in navel deep water  and by at least 75-80% in chest to neck deep water .  Viscosity of the water  is needed for resistance of strengthening. Water  current perturbations provides challenge to standing balance requiring increased core activation.                                                                                                                                   PATIENT EDUCATION:  Education details: intro to aquatic therapy Person educated: patient Education method: Explanation, demonstration Education comprehension: verbalized understanding;   HOME EXERCISE PROGRAM: Aquatic TBA  ASSESSMENT:  CLINICAL IMPRESSION: Focused more on balance today. Pt with some frustration but with vc for confidence and adjusting positioning pt increasingly successful. VC as well as demonstration provided throughout. No reports of increased LBP. STG #3 not yet met as the weather has not been conducive to walking. Goals ongoing.   Initial impression:  Patient is a 74 y.o. f who was seen today for physical therapy evaluation and treatment for LBP.  She arrives with dtr whom assists her as well as lives with her.  Pt reports she is very sedentary.  Has had LBP for 5 yrs with exacerbation after long car ride over holidays.  Patient presents with pain limited deficits in  LB with some radiation into right hip in strength, ROM, endurance, activity tolerance, gait, balance, and functional mobility with ADL's. She is having to modify and restrict ADL's as indicated by outcome measure score as well as subjective information and objective measures which is affecting overall participation. In part some of her issues likely stem for chronic inactivity. Patient will benefit from skilled physical therapy in order to improve function and reduce impairment. Plan to begin in aquatics and transition to land as approp.    OBJECTIVE IMPAIRMENTS: Abnormal gait, decreased activity tolerance, decreased balance, decreased endurance, decreased knowledge of use of DME,  decreased mobility, difficulty walking, decreased strength, postural dysfunction, obesity, and pain.   ACTIVITY LIMITATIONS: carrying, lifting, bending, standing, squatting, stairs, transfers, bed mobility, and locomotion  level  PARTICIPATION LIMITATIONS: meal prep, cleaning, laundry, shopping, and community activity  PERSONAL FACTORS: Age, Behavior pattern, Fitness, and 1-2 comorbidities: see Pmhx are also affecting patient's functional outcome.   REHAB POTENTIAL: Good  CLINICAL DECISION MAKING: Stable/uncomplicated  EVALUATION COMPLEXITY: Low   GOALS: Goals reviewed with patient? Yes  SHORT TERM GOALS: Target date: 03/17/24  Pt will tolerate full aquatic sessions consistently without increase in pain and with improving function to demonstrate good toleration and effectiveness of intervention.  Baseline: Goal status: met - 03/06/24  2.  Pt will tolerate walking to and from setting and engaging in aquatic therapy session without excessive fatigue or increase in pain to demonstrate improved toleration to activity.  Baseline:  Goal status: met -03/06/24  3.  Pt will report compliance of daily walking at least 10 minutes (5 days a week) Baseline:  Goal status: INITIAL  4.   Pt will report improvement with transfers getting out of bed with 50% improvement Baseline:  Goal status: INITIAL    LONG TERM GOALS: Target date: 04/12/24  Pt to improve on ODI by 13% to demonstrate statistically significant Improvement in function. (MCID 13-15%) Baseline: 22/50=44% Goal status: INITIAL  2.  Pt will improve on Berg balance test to >/= 45/56 to demonstrate a decrease in fall risk. (MCD 7) Baseline: 36/56 Goal status: INITIAL  3. Pt will improve strength in hip flex by at least 1 grade to demonstrate improved overall physical function Baseline:  Goal status: INITIAL  4.  Pt will report decrease in pain by at least 50% for improved toleration to activity/quality of life and to demonstrate improved management of pain. Baseline:  Goal status: INITIAL  5.  Pt will report improvement in activity tolerance/energy level by 75% Baseline:  Goal status: INITIAL  6.  Pt will be indep with final HEP's (land and  aquatic as appropriate) for continued management of condition Baseline:  Goal status: INITIAL  PLAN:  PT FREQUENCY: 2x/week  PT DURATION: 8 weeks  PLANNED INTERVENTIONS: [id number]- PT Re-evaluation, 97750- Physical Performance Testing, 97110-Therapeutic exercises, 97530- Therapeutic activity, 97112- Neuromuscular re-education, 97535- Self Care, [id number]- Manual therapy, [id number]- Gait training, [phone]- Aquatic Therapy, [phone]- Ionotophoresis 4mg /ml Dexamethasone , 79439 (1-2 muscles), 20561 (3+ muscles)- Dry Needling, Patient/Family education, Balance training, Stair training, Taping, Joint mobilization, DME instructions, Cryotherapy, and Moist heat.  PLAN FOR NEXT SESSION: Aquatics only to begin: LE strengthening, core strengthening, balance and proprioception training, pt edu on exercise importance, pain management; HEP  Ronal Kem) Chynah Orihuela MPT 03/14/24 12:34 PM [facility] [address] [place], [place], [address] Phone: [phone]   Fax:  [phone]   "

## 2024-03-20 ENCOUNTER — Ambulatory Visit (HOSPITAL_BASED_OUTPATIENT_CLINIC_OR_DEPARTMENT_OTHER): Admitting: Physical Therapy

## 2024-03-26 ENCOUNTER — Ambulatory Visit (HOSPITAL_BASED_OUTPATIENT_CLINIC_OR_DEPARTMENT_OTHER): Admitting: Physical Therapy

## 2024-03-28 ENCOUNTER — Ambulatory Visit (HOSPITAL_BASED_OUTPATIENT_CLINIC_OR_DEPARTMENT_OTHER): Payer: Self-pay | Admitting: Physical Therapy

## 2024-03-28 ENCOUNTER — Ambulatory Visit (INDEPENDENT_AMBULATORY_CARE_PROVIDER_SITE_OTHER): Admitting: Adult Health

## 2024-03-29 ENCOUNTER — Ambulatory Visit (HOSPITAL_BASED_OUTPATIENT_CLINIC_OR_DEPARTMENT_OTHER): Admitting: Physical Therapy

## 2024-04-03 ENCOUNTER — Ambulatory Visit (HOSPITAL_BASED_OUTPATIENT_CLINIC_OR_DEPARTMENT_OTHER): Admitting: Physical Therapy

## 2024-04-05 ENCOUNTER — Ambulatory Visit (HOSPITAL_BASED_OUTPATIENT_CLINIC_OR_DEPARTMENT_OTHER): Admitting: Physical Therapy

## 2024-04-10 ENCOUNTER — Ambulatory Visit (HOSPITAL_BASED_OUTPATIENT_CLINIC_OR_DEPARTMENT_OTHER): Admitting: Physical Therapy

## 2024-04-12 ENCOUNTER — Ambulatory Visit (HOSPITAL_BASED_OUTPATIENT_CLINIC_OR_DEPARTMENT_OTHER): Admitting: Physical Therapy

## 2024-06-03 ENCOUNTER — Ambulatory Visit

## 2024-06-05 ENCOUNTER — Ambulatory Visit (HOSPITAL_COMMUNITY)

## 2024-07-16 ENCOUNTER — Ambulatory Visit: Admitting: Adult Health

## 2024-07-16 ENCOUNTER — Encounter

## 2024-08-20 ENCOUNTER — Ambulatory Visit: Admitting: Physician Assistant
